# Patient Record
Sex: Male | Born: 1950 | Race: Black or African American | Hispanic: No | Smoking: Former smoker
Health system: Southern US, Community
[De-identification: ages and names within clinical notes are randomized; demographics above are authoritative.]

## PROBLEM LIST (undated history)

## (undated) DIAGNOSIS — Z5189 Encounter for other specified aftercare: Secondary | ICD-10-CM

## (undated) DIAGNOSIS — E119 Type 2 diabetes mellitus without complications: Secondary | ICD-10-CM

## (undated) DIAGNOSIS — Z9114 Patient's other noncompliance with medication regimen: Secondary | ICD-10-CM

## (undated) DIAGNOSIS — J302 Other seasonal allergic rhinitis: Secondary | ICD-10-CM

## (undated) DIAGNOSIS — N183 Chronic kidney disease, stage 3 unspecified: Secondary | ICD-10-CM

## (undated) DIAGNOSIS — I219 Acute myocardial infarction, unspecified: Secondary | ICD-10-CM

## (undated) DIAGNOSIS — M1990 Unspecified osteoarthritis, unspecified site: Secondary | ICD-10-CM

## (undated) DIAGNOSIS — Z992 Dependence on renal dialysis: Secondary | ICD-10-CM

## (undated) DIAGNOSIS — E785 Hyperlipidemia, unspecified: Secondary | ICD-10-CM

## (undated) DIAGNOSIS — H269 Unspecified cataract: Secondary | ICD-10-CM

## (undated) DIAGNOSIS — J189 Pneumonia, unspecified organism: Secondary | ICD-10-CM

## (undated) DIAGNOSIS — I2510 Atherosclerotic heart disease of native coronary artery without angina pectoris: Secondary | ICD-10-CM

## (undated) DIAGNOSIS — T7840XA Allergy, unspecified, initial encounter: Secondary | ICD-10-CM

## (undated) DIAGNOSIS — Z91148 Patient's other noncompliance with medication regimen for other reason: Secondary | ICD-10-CM

## (undated) DIAGNOSIS — I739 Peripheral vascular disease, unspecified: Secondary | ICD-10-CM

## (undated) DIAGNOSIS — M810 Age-related osteoporosis without current pathological fracture: Secondary | ICD-10-CM

## (undated) DIAGNOSIS — I169 Hypertensive crisis, unspecified: Secondary | ICD-10-CM

## (undated) DIAGNOSIS — R0600 Dyspnea, unspecified: Secondary | ICD-10-CM

## (undated) DIAGNOSIS — N186 End stage renal disease: Secondary | ICD-10-CM

## (undated) DIAGNOSIS — H35039 Hypertensive retinopathy, unspecified eye: Secondary | ICD-10-CM

## (undated) DIAGNOSIS — I10 Essential (primary) hypertension: Secondary | ICD-10-CM

## (undated) DIAGNOSIS — E1142 Type 2 diabetes mellitus with diabetic polyneuropathy: Secondary | ICD-10-CM

## (undated) DIAGNOSIS — K219 Gastro-esophageal reflux disease without esophagitis: Secondary | ICD-10-CM

## (undated) DIAGNOSIS — E11319 Type 2 diabetes mellitus with unspecified diabetic retinopathy without macular edema: Secondary | ICD-10-CM

## (undated) DIAGNOSIS — M797 Fibromyalgia: Secondary | ICD-10-CM

## (undated) DIAGNOSIS — I509 Heart failure, unspecified: Secondary | ICD-10-CM

## (undated) DIAGNOSIS — W3400XA Accidental discharge from unspecified firearms or gun, initial encounter: Secondary | ICD-10-CM

## (undated) DIAGNOSIS — H409 Unspecified glaucoma: Secondary | ICD-10-CM

## (undated) DIAGNOSIS — Y249XXA Unspecified firearm discharge, undetermined intent, initial encounter: Secondary | ICD-10-CM

## (undated) DIAGNOSIS — D649 Anemia, unspecified: Secondary | ICD-10-CM

## (undated) HISTORY — PX: JOINT REPLACEMENT: SHX530

## (undated) HISTORY — PX: TOTAL KNEE ARTHROPLASTY: SHX125

## (undated) HISTORY — DX: Allergy, unspecified, initial encounter: T78.40XA

## (undated) HISTORY — DX: Unspecified cataract: H26.9

## (undated) HISTORY — DX: Heart failure, unspecified: I50.9

## (undated) HISTORY — DX: Unspecified glaucoma: H40.9

## (undated) HISTORY — DX: Age-related osteoporosis without current pathological fracture: M81.0

## (undated) HISTORY — DX: Essential (primary) hypertension: I10

## (undated) HISTORY — DX: Acute myocardial infarction, unspecified: I21.9

## (undated) HISTORY — DX: Peripheral vascular disease, unspecified: I73.9

## (undated) HISTORY — PX: EYE SURGERY: SHX253

## (undated) HISTORY — PX: OTHER SURGICAL HISTORY: SHX169

## (undated) HISTORY — DX: Type 2 diabetes mellitus with unspecified diabetic retinopathy without macular edema: E11.319

## (undated) HISTORY — PX: COLONOSCOPY: SHX174

## (undated) HISTORY — DX: Encounter for other specified aftercare: Z51.89

## (undated) HISTORY — DX: Hypertensive retinopathy, unspecified eye: H35.039

## (undated) HISTORY — DX: Hyperlipidemia, unspecified: E78.5

---

## 2015-10-30 DIAGNOSIS — J189 Pneumonia, unspecified organism: Secondary | ICD-10-CM

## 2015-10-30 HISTORY — DX: Pneumonia, unspecified organism: J18.9

## 2017-01-22 ENCOUNTER — Encounter: Payer: Self-pay | Admitting: Family Medicine

## 2017-11-19 ENCOUNTER — Encounter (HOSPITAL_COMMUNITY): Payer: Self-pay | Admitting: Emergency Medicine

## 2017-11-19 ENCOUNTER — Other Ambulatory Visit: Payer: Self-pay

## 2017-11-19 ENCOUNTER — Observation Stay (HOSPITAL_COMMUNITY)
Admission: EM | Admit: 2017-11-19 | Discharge: 2017-11-20 | Disposition: A | Discharge status: Home / Self Care | Payer: BLUE CROSS/BLUE SHIELD | Attending: Internal Medicine | Admitting: Internal Medicine

## 2017-11-19 ENCOUNTER — Emergency Department (HOSPITAL_COMMUNITY): Payer: BLUE CROSS/BLUE SHIELD

## 2017-11-19 DIAGNOSIS — S0101XA Laceration without foreign body of scalp, initial encounter: Secondary | ICD-10-CM | POA: Insufficient documentation

## 2017-11-19 DIAGNOSIS — E162 Hypoglycemia, unspecified: Secondary | ICD-10-CM | POA: Diagnosis present

## 2017-11-19 DIAGNOSIS — W1830XA Fall on same level, unspecified, initial encounter: Secondary | ICD-10-CM | POA: Diagnosis not present

## 2017-11-19 DIAGNOSIS — W19XXXA Unspecified fall, initial encounter: Secondary | ICD-10-CM

## 2017-11-19 DIAGNOSIS — Z7982 Long term (current) use of aspirin: Secondary | ICD-10-CM | POA: Insufficient documentation

## 2017-11-19 DIAGNOSIS — Z794 Long term (current) use of insulin: Secondary | ICD-10-CM | POA: Diagnosis not present

## 2017-11-19 DIAGNOSIS — I10 Essential (primary) hypertension: Secondary | ICD-10-CM | POA: Diagnosis not present

## 2017-11-19 DIAGNOSIS — Z79899 Other long term (current) drug therapy: Secondary | ICD-10-CM | POA: Diagnosis not present

## 2017-11-19 DIAGNOSIS — N179 Acute kidney failure, unspecified: Secondary | ICD-10-CM

## 2017-11-19 DIAGNOSIS — D649 Anemia, unspecified: Secondary | ICD-10-CM

## 2017-11-19 DIAGNOSIS — E11649 Type 2 diabetes mellitus with hypoglycemia without coma: Secondary | ICD-10-CM | POA: Diagnosis not present

## 2017-11-19 HISTORY — DX: Unspecified osteoarthritis, unspecified site: M19.90

## 2017-11-19 LAB — PROTIME-INR
INR: 1.07
Prothrombin Time: 13.8 seconds (ref 11.4–15.2)

## 2017-11-19 LAB — I-STAT CHEM 8, ED
BUN: 20 mg/dL (ref 6–20)
Calcium, Ion: 1.14 mmol/L — ABNORMAL LOW (ref 1.15–1.40)
Chloride: 105 mmol/L (ref 101–111)
Creatinine, Ser: 1.8 mg/dL — ABNORMAL HIGH (ref 0.61–1.24)
Glucose, Bld: 64 mg/dL — ABNORMAL LOW (ref 65–99)
HCT: 36 % — ABNORMAL LOW (ref 39.0–52.0)
Hemoglobin: 12.2 g/dL — ABNORMAL LOW (ref 13.0–17.0)
Potassium: 4.4 mmol/L (ref 3.5–5.1)
Sodium: 142 mmol/L (ref 135–145)
TCO2: 28 mmol/L (ref 22–32)

## 2017-11-19 LAB — GLUCOSE, CAPILLARY: Glucose-Capillary: 187 mg/dL — ABNORMAL HIGH (ref 65–99)

## 2017-11-19 LAB — CBG MONITORING, ED
Glucose-Capillary: 27 mg/dL — CL (ref 65–99)
Glucose-Capillary: 77 mg/dL (ref 65–99)
Glucose-Capillary: 64 mg/dL — ABNORMAL LOW (ref 65–99)
Glucose-Capillary: 173 mg/dL — ABNORMAL HIGH (ref 65–99)
Glucose-Capillary: 184 mg/dL — ABNORMAL HIGH (ref 65–99)

## 2017-11-19 MED ORDER — GABAPENTIN 400 MG PO CAPS
800.0000 mg | ORAL_CAPSULE | Freq: Three times a day (TID) | ORAL | Status: DC
  Administered 2017-11-19 – 2017-11-20 (×2): 800 mg via ORAL
  Filled 2017-11-19 (×2): qty 2

## 2017-11-19 MED ORDER — HYDRALAZINE HCL 20 MG/ML IJ SOLN
10.0000 mg | Freq: Once | INTRAMUSCULAR | Status: AC
  Administered 2017-11-19: 10 mg via INTRAVENOUS
  Filled 2017-11-19: qty 1

## 2017-11-19 MED ORDER — HYDRALAZINE HCL 20 MG/ML IJ SOLN
10.0000 mg | INTRAMUSCULAR | Status: DC | PRN
  Administered 2017-11-20: 10 mg via INTRAVENOUS
  Filled 2017-11-19: qty 1

## 2017-11-19 MED ORDER — BACITRACIN ZINC 500 UNIT/GM EX OINT
TOPICAL_OINTMENT | Freq: Once | CUTANEOUS | Status: AC
  Administered 2017-11-19: 1 via TOPICAL
  Filled 2017-11-19: qty 0.9

## 2017-11-19 MED ORDER — METOPROLOL TARTRATE 50 MG PO TABS
100.0000 mg | ORAL_TABLET | Freq: Two times a day (BID) | ORAL | Status: DC
  Administered 2017-11-19 – 2017-11-20 (×2): 100 mg via ORAL
  Filled 2017-11-19 (×2): qty 2

## 2017-11-19 MED ORDER — MONTELUKAST SODIUM 10 MG PO TABS: 10.0000 mg | ORAL_TABLET | Freq: Every day | ORAL | Status: DC

## 2017-11-19 MED ORDER — ASPIRIN EC 81 MG PO TBEC
81.0000 mg | DELAYED_RELEASE_TABLET | Freq: Every day | ORAL | Status: DC
  Administered 2017-11-20: 81 mg via ORAL
  Filled 2017-11-19: qty 1

## 2017-11-19 MED ORDER — ACETAMINOPHEN 650 MG RE SUPP: 650.0000 mg | Freq: Four times a day (QID) | RECTAL | Status: DC | PRN

## 2017-11-19 MED ORDER — ACETAMINOPHEN 325 MG PO TABS: 650.0000 mg | ORAL_TABLET | Freq: Four times a day (QID) | ORAL | Status: DC | PRN

## 2017-11-19 MED ORDER — HYDRALAZINE HCL 20 MG/ML IJ SOLN: 20.0000 mg | INTRAMUSCULAR | Status: DC

## 2017-11-19 MED ORDER — SODIUM CHLORIDE 0.9% FLUSH: 3.0000 mL | INTRAVENOUS | Status: DC | PRN

## 2017-11-19 MED ORDER — HYDRALAZINE HCL 20 MG/ML IJ SOLN
10.0000 mg | INTRAMUSCULAR | Status: AC
  Administered 2017-11-19: 10 mg via INTRAVENOUS
  Filled 2017-11-19: qty 1

## 2017-11-19 MED ORDER — ONDANSETRON HCL 4 MG/2ML IJ SOLN
4.0000 mg | Freq: Four times a day (QID) | INTRAMUSCULAR | Status: DC | PRN
  Administered 2017-11-20: 4 mg via INTRAVENOUS
  Filled 2017-11-19: qty 2

## 2017-11-19 MED ORDER — SENNOSIDES-DOCUSATE SODIUM 8.6-50 MG PO TABS: 1.0000 | ORAL_TABLET | Freq: Every evening | ORAL | Status: DC | PRN

## 2017-11-19 MED ORDER — HYDROCODONE-ACETAMINOPHEN 10-325 MG PO TABS
0.5000 | ORAL_TABLET | Freq: Three times a day (TID) | ORAL | Status: DC | PRN
  Administered 2017-11-19: 1 via ORAL
  Administered 2017-11-20: 0.5 via ORAL
  Filled 2017-11-19 (×2): qty 1

## 2017-11-19 MED ORDER — DEXTROSE 50 % IV SOLN
INTRAVENOUS | Status: AC
  Administered 2017-11-19: 50 mL
  Filled 2017-11-19: qty 50

## 2017-11-19 MED ORDER — SODIUM CHLORIDE 0.9 % IV SOLN
250.0000 mL | INTRAVENOUS | Status: DC | PRN
  Administered 2017-11-19: 250 mL via INTRAVENOUS

## 2017-11-19 MED ORDER — SODIUM CHLORIDE 0.9% FLUSH
3.0000 mL | Freq: Two times a day (BID) | INTRAVENOUS | Status: DC
  Administered 2017-11-19 – 2017-11-20 (×2): 3 mL via INTRAVENOUS

## 2017-11-19 MED ORDER — CLOPIDOGREL BISULFATE 75 MG PO TABS
75.0000 mg | ORAL_TABLET | Freq: Every day | ORAL | Status: DC
  Administered 2017-11-20: 75 mg via ORAL
  Filled 2017-11-19: qty 1

## 2017-11-19 MED ORDER — DEXTROSE 10 % IV SOLN
100.0000 mL | Freq: Once | INTRAVENOUS | Status: AC
Start: 2017-11-19 — End: 2017-11-19
  Administered 2017-11-19: 100 mL via INTRAVENOUS
  Filled 2017-11-19: qty 500

## 2017-11-19 MED ORDER — VITAMIN E 45 MG (100 UNIT) PO CAPS
1000.0000 [IU] | ORAL_CAPSULE | Freq: Every day | ORAL | Status: DC
  Administered 2017-11-20: 1000 [IU] via ORAL
  Filled 2017-11-19: qty 2

## 2017-11-19 MED ORDER — ONDANSETRON HCL 4 MG PO TABS: 4.0000 mg | ORAL_TABLET | Freq: Four times a day (QID) | ORAL | Status: DC | PRN

## 2017-11-19 MED ORDER — AMLODIPINE BESYLATE 5 MG PO TABS
5.0000 mg | ORAL_TABLET | Freq: Every day | ORAL | Status: DC
  Administered 2017-11-20: 5 mg via ORAL
  Filled 2017-11-19: qty 1

## 2017-11-19 MED ORDER — ROSUVASTATIN CALCIUM 10 MG PO TABS: 10.0000 mg | ORAL_TABLET | Freq: Every day | ORAL | Status: DC

## 2017-11-19 MED ORDER — VITAMIN D3 25 MCG (1000 UNIT) PO TABS
1000.0000 [IU] | ORAL_TABLET | Freq: Every day | ORAL | Status: DC
  Administered 2017-11-20: 1000 [IU] via ORAL
  Filled 2017-11-19: qty 1

## 2017-11-19 NOTE — ED Notes (Signed)
Pt's CBG=77

## 2017-11-19 NOTE — ED Notes (Signed)
Pt had drawn for labs:  [place] Lav[place]  lt green Dark green x2

## 2017-11-19 NOTE — ED Notes (Signed)
ED TO INPATIENT HANDOFF REPORT  Name/Age/Gender William Webb 67 y.o. male  Code Status Code Status History    This patient does not have a recorded code status. Please follow your organizational policy for patients in this situation.      Home/SNF/Other Home  Chief Complaint Hypoglycemia  Level of Care/Admitting Diagnosis ED Disposition    ED Disposition Condition Comment   Admit  Hospital Area: [facility] [[id number]]  Level of Care: Telemetry [5]  Admit to tele based on following criteria: Other see comments  Comments: hypoglycemia  Diagnosis: Hypoglycemia [038882]  Admitting Physician: Aline August [[id number]]  Attending Physician: Aline August [[id number]]  PT Class (Do Not Modify): Observation [104]  PT Acc Code (Do Not Modify): Observation [10022]       Medical History Past Medical History:  Diagnosis Date  . Diabetes mellitus without complication ([place])   . Neuropathy   . Osteoarthritis     Allergies No Known Allergies  IV Location/Drains/Wounds Patient Lines/Drains/Airways Status   Active Line/Drains/Airways    Name:   Placement date:   Placement time:   Site:   Days:   Peripheral IV 11/19/17 Right Hand   11/19/17    1341    Hand   less than 1          Labs/Imaging Results for orders placed or performed during the hospital encounter of 11/19/17 (from the past 48 hour(s))  CBG monitoring, ED     Status: Abnormal   Collection Time: 11/19/17  1:51 PM  Result Value Ref Range   Glucose-Capillary 27 (LL) 65 - 99 mg/dL  CBG monitoring, ED (now and then every hour for 3 hours)     Status: None   Collection Time: 11/19/17  2:29 PM  Result Value Ref Range   Glucose-Capillary 77 65 - 99 mg/dL  Protime-INR     Status: None   Collection Time: 11/19/17  2:35 PM  Result Value Ref Range   Prothrombin Time 13.8 11.4 - 15.2 seconds   INR 1.07   I-Stat Chem 8, ED     Status: Abnormal   Collection Time: 11/19/17  2:42 PM  Result Value Ref  Range   Sodium 142 135 - 145 mmol/L   Potassium 4.4 3.5 - 5.1 mmol/L   Chloride 105 101 - 111 mmol/L   BUN 20 6 - 20 mg/dL   Creatinine, Ser 1.80 (H) 0.61 - 1.24 mg/dL   Glucose, Bld 64 (L) 65 - 99 mg/dL   Calcium, Ion 1.14 (L) 1.15 - 1.40 mmol/L   TCO2 28 22 - 32 mmol/L   Hemoglobin 12.2 (L) 13.0 - 17.0 g/dL   HCT 36.0 (L) 39.0 - 52.0 %  CBG monitoring, ED (now and then every hour for 3 hours)     Status: Abnormal   Collection Time: 11/19/17  3:37 PM  Result Value Ref Range   Glucose-Capillary 64 (L) 65 - 99 mg/dL  POC CBG, ED     Status: Abnormal   Collection Time: 11/19/17  6:06 PM  Result Value Ref Range   Glucose-Capillary 173 (H) 65 - 99 mg/dL   Ct Head Wo Contrast  Result Date: 11/19/2017 CLINICAL DATA:  [place] today hitting head, small laceration to the left posterior scalp EXAM: CT HEAD WITHOUT CONTRAST TECHNIQUE: Contiguous axial images were obtained from the base of the skull through the vertex without intravenous contrast. COMPARISON:  None. FINDINGS: Brain: The ventricular system is minimally prominent as are the cortical sulci indicating very  mild atrophy. The septum is midline in position. Fourth ventricle and basilar cisterns are unremarkable. No hemorrhage, mass lesion, or acute infarction is seen. Vascular: Some calcification of the vertebral arteries is noted. Skull: On bone window images, no calvarial abnormality is seen. Sinuses/Orbits: There is a small amount of fluid layering within the right partition of the sphenoid sinus. No definite basilar skull fracture is evident. Other: None. IMPRESSION: 1. No acute intracranial abnormality.  Mild atrophy. 2. Small amount of fluid layers in the right partition of the sphenoid sinus. No definite basilar skull fracture is seen. Electronically Signed   By: Ivar Drape M.D.   On: 11/19/2017 14:48    Pending Labs [employer] (From admission, onward)   Start     Ordered   Signed and Held  Hemoglobin A1c  Tomorrow morning,   R      Signed and Held   Signed and Held  Comprehensive metabolic panel  Tomorrow morning,   R     Signed and Held   Signed and Held  CBC  Tomorrow morning,   R     Signed and Held      Vitals/Pain Today's Vitals   11/19/17 1650 11/19/17 1700 11/19/17 1715 11/19/17 1730  BP: (!) 235/110 (!) 219/115 (!) 217/108 (!) 187/91  Pulse: 69 71 73 72  Resp: 20     SpO2: 100% 100% 100% 100%  Weight:      Height:        Isolation Precautions No active isolations  Medications Medications  sodium chloride flush (NS) 0.9 % injection 3 mL (3 mLs Intravenous Given 11/19/17 1454)  sodium chloride flush (NS) 0.9 % injection 3 mL (not administered)  0.9 %  sodium chloride infusion (250 mLs Intravenous New Bag/Given 11/19/17 1422)  dextrose 50 % solution (50 mLs  Given 11/19/17 1359)  dextrose 10 % infusion (0 mLs Intravenous Stopped 11/19/17 1819)  hydrALAZINE (APRESOLINE) injection 10 mg (10 mg Intravenous Given 11/19/17 1700)  bacitracin ointment (1 application Topical Given 11/19/17 1736)    Mobility walks

## 2017-11-19 NOTE — ED Provider Notes (Signed)
[facility] DEPT Provider Note   CSN: [id number] Arrival date & time: 11/19/17  1317     History   Chief Complaint Chief Complaint  Patient presents with  . Hypoglycemia    HPI William Webb is a 67 y.o. male with PMH/o HTN resents for evaluation of hypoglycemia.  Per patient, he did not have anything to eat today but still took 50 units of his insulin today.  Patient states that he was planning to go eat lunch right after he took his insulin but got delayed because of a phone call.  Patient states that he felt lightheaded on the phone and fell backwards and hit his head.  She states he is on blood thinners.  Patient takes 10 units of NovoLog 2-3 times a day +50 units of the [place].  He has not changed any of his medications.  He denies any preceding dizziness, chest pain.  Patient denies any recent sicknesses, fevers, abdominal pain, nausea/vomiting, dysuria, hematuria.  The history is provided by the patient.    Past Medical History:  Diagnosis Date  . Diabetes mellitus without complication ([place])     There are no active problems to display for this patient.    The histories are not reviewed yet. Please review them in the "History" navigator section and refresh this [place].     Home Medications    Prior to Admission medications   Medication Sig Start Date End Date Taking? Authorizing Provider  gabapentin (NEURONTIN) 800 MG tablet Take 800 mg by mouth 3 (three) times daily.   Yes [provider]  insulin aspart (NOVOLOG) 100 UNIT/ML injection Inject 4 Units into the skin 3 (three) times daily as needed for high blood sugar.   Yes [provider]  insulin degludec (TRESIBA FLEXTOUCH) 100 UNIT/ML SOPN FlexTouch Pen Inject 4 Units into the skin at bedtime.   Yes [provider]  rosuvastatin (CRESTOR) 10 MG tablet Take 10 mg by mouth daily.   Yes [provider]    Family History History reviewed. No  pertinent family history.  Social History Social History   Tobacco Use  . Smoking status: Former Research scientist (life sciences)  . Smokeless tobacco: Never Used  Substance Use Topics  . Alcohol use: Yes  . Drug use: No     Allergies   Patient has no known allergies.   Review of Systems Review of Systems  Constitutional: Negative for fever.  Respiratory: Negative for cough and shortness of breath.   Cardiovascular: Negative for chest pain.  Gastrointestinal: Negative for abdominal pain, nausea and vomiting.  Genitourinary: Negative for dysuria and hematuria.  Neurological: Positive for light-headedness. Negative for dizziness and headaches.     Physical Exam Updated Vital Signs BP (!) 232/112 (BP Location: Left Arm)   Pulse 62   Resp 12   Ht 6\' 3"  (1.905 m)   Wt 98 kg (216 lb)   SpO2 98%   BMI 27.00 kg/m   Physical Exam  Constitutional: He is oriented to person, place, and time. He appears well-developed and well-nourished.  HENT:  Head: Normocephalic and atraumatic.    Mouth/Throat: Oropharynx is clear and moist and mucous membranes are normal.  No skull deformity or crepitus.   Eyes: Conjunctivae, EOM and lids are normal. Pupils are equal, round, and reactive to light.  Neck: Full passive range of motion without pain.  Cardiovascular: Normal rate, regular rhythm, normal heart sounds and normal pulses. Exam reveals no gallop and no friction rub.  No  murmur heard. Pulmonary/Chest: Effort normal and breath sounds normal.  Abdominal: Soft. Normal appearance. There is no tenderness. There is no rigidity and no guarding.  Musculoskeletal: Normal range of motion.  Neurological: He is alert and oriented to person, place, and time.  Follows commands, Moves all extremities  5/5 strength to BUE and BLE  Sensation intact throughout all major nerve distributions A&Ox4  Skin: Skin is warm and dry. Capillary refill takes less than 2 seconds.  Psychiatric: He has a normal mood and affect. His  speech is normal.  Nursing note and vitals reviewed.    ED Treatments / Results  Labs (all labs ordered are listed, but only abnormal results are displayed) Labs Reviewed  CBG MONITORING, ED - Abnormal; Notable for the following components:      Result Value   Glucose-Capillary 27 (*)    All other components within normal limits  I-STAT CHEM 8, ED - Abnormal; Notable for the following components:   Creatinine, Ser 1.80 (*)    Glucose, Bld 64 (*)    Calcium, Ion 1.14 (*)    Hemoglobin 12.2 (*)    HCT 36.0 (*)    All other components within normal limits  CBG MONITORING, ED - Abnormal; Notable for the following components:   Glucose-Capillary 64 (*)    All other components within normal limits  PROTIME-INR  CBG MONITORING, ED  CBG MONITORING, ED  CBG MONITORING, ED    EKG  EKG Interpretation None       Radiology Ct Head Wo Contrast  Result Date: 11/19/2017 CLINICAL DATA:  [place] today hitting head, small laceration to the left posterior scalp EXAM: CT HEAD WITHOUT CONTRAST TECHNIQUE: Contiguous axial images were obtained from the base of the skull through the vertex without intravenous contrast. COMPARISON:  None. FINDINGS: Brain: The ventricular system is minimally prominent as are the cortical sulci indicating very mild atrophy. The septum is midline in position. Fourth ventricle and basilar cisterns are unremarkable. No hemorrhage, mass lesion, or acute infarction is seen. Vascular: Some calcification of the vertebral arteries is noted. Skull: On bone window images, no calvarial abnormality is seen. Sinuses/Orbits: There is a small amount of fluid layering within the right partition of the sphenoid sinus. No definite basilar skull fracture is evident. Other: None. IMPRESSION: 1. No acute intracranial abnormality.  Mild atrophy. 2. Small amount of fluid layers in the right partition of the sphenoid sinus. No definite basilar skull fracture is seen. Electronically Signed   By:  Ivar Drape M.D.   On: 11/19/2017 14:48    Procedures Procedures (including critical care time)  Medications Ordered in ED Medications  sodium chloride flush (NS) 0.9 % injection 3 mL (3 mLs Intravenous Given 11/19/17 1454)  sodium chloride flush (NS) 0.9 % injection 3 mL (not administered)  0.9 %  sodium chloride infusion (250 mLs Intravenous New Bag/Given 11/19/17 1422)  dextrose 10 % infusion (not administered)  hydrALAZINE (APRESOLINE) injection 10 mg (not administered)  bacitracin ointment (not administered)  dextrose 50 % solution (50 mLs  Given 11/19/17 1359)     Initial Impression / Assessment and Plan / ED Course  I have reviewed the triage vital signs and the nursing notes.  Pertinent labs & imaging results that were available during my care of the patient were reviewed by me and considered in my medical decision making (see chart for details).      67 y.o. male with past medical history of diabetes who presents for evaluation of hypoglycemia.  Patient states that he had did not have anything to eat all day and took 50 units of his insulin.  Patient reports that he started feeling lightheaded causing him to pass out and hit the back posterior aspect of his head.  On EMS arrival, blood glucose was 35.  Improved to 89 after D 25 IV.  On ED arrival, patient is alert and oriented x4.  Initial blood glucose reading is 27.  Patient given D50.  Will also have patient eat.  Patient does have a small superficial abrasion noted to the left posterior aspect of his head.  He is on blood thinners.  We will plan to get a CT head for further evaluation.  Repeat CBG is 77.  He is still continuing to eat.  We will plan to repeat CBG and monitor after labs.  I-STAT Chem-8 reviewed.  Creatinine is 1.80.  Repeat CBG/64.  He has not improved after 2 IV doses of the 25 D50 and continuously eating here in the emergency department.  We will plan to start him on a D10 drip.  We will plan to consult  hospitalist for admission.  Patient does have elevated blood pressure here in the department.  Denies any chest pain, difficulty breathing, numbness/weakness of his arms or legs.  He is on blood pressure medication.  Does not recall what medication he is on.  Will plan to give a small dose of antihypertensives here in the department.  Discussed patient with Dr. Bonney Roussel (hospitalist). Will admit.   Final Clinical Impressions(s) / ED Diagnoses   Final diagnoses:  Hypoglycemia    ED Discharge Orders    None       Desma Mcgregor 11/19/17 2312    Sherwood Gambler, MD 11/20/17 1020

## 2017-11-19 NOTE — ED Notes (Signed)
Called pharmacy, waiting for

## 2017-11-19 NOTE — ED Notes (Signed)
Bed: [facility] Expected date:  Expected time:  Means of arrival:  Comments: 

## 2017-11-19 NOTE — ED Notes (Signed)
Pt given coca cola, [place] sandwhich and chicken noodle soup.

## 2017-11-19 NOTE — Progress Notes (Signed)
Patient approved of brother and sister to be in room during admission process questioning

## 2017-11-19 NOTE — ED Triage Notes (Signed)
Per ems pt from house, hypoglycemia. EMS reports ran this call twice  today, refused transport the first time. Per EMS per  Pt took 50 units insulin after the first episode. second episode CBG reading 35, corrected to 89 after give D 25 IV. Alert and oriented x 4.

## 2017-11-19 NOTE — H&P (Signed)
History and Physical    William Webb ZOX:[id number] DOB: [birth year] DOA: 11/19/2017  PCP: No primary care provider on file.   Patient coming from: Home  I have personally briefly reviewed patient's old medical records in [place]  Chief Complaint: Low blood sugar  HPI: William Webb is a 67 y.o. male with medical history significant of diabetes mellitus type 2 on insulin, hypertension presented with hypoglycemia.  Patient stated that he did not have anything to eat today but he still took his 50 units of [place].  He was planning to eat lunch right after his insulin but got delayed because of phone call.  He felt lightheaded and fell backwards and hit his head.  Patient denies any seizure activities, recent fever, nausea, vomiting, bowel or bladder incontinence.  He states that his dose of [place] has not been changed recently although his dose of NovoLog has been changed.  ED Course: On EMS arrival, blood glucose was 35 which improved to 89 after IV dextrose.  In the ED patient's blood glucose was 27 for which she was given D50.  He was given food to eat.  CT head was negative for intracranial bleed.  Repeat blood sugar was still is 64.  The patient was started on D10 drip.  Hospitalist service was called to evaluate the patient.  Review of Systems: As per HPI otherwise 10 point review of systems negative.    Past Medical History:  Diagnosis Date  . Diabetes mellitus without complication ([place])     History reviewed. No pertinent surgical history.  Social history  reports that he has quit smoking. he has never used smokeless tobacco. He reports that he drinks alcohol. He reports that he does not use drugs.  -Recently moved from [facility].  No Known Allergies  Family history History reviewed. No pertinent family history.  But no history of cancer or TB  Prior to Admission medications   Medication Sig Start Date End Date Taking? Authorizing Provider  gabapentin  (NEURONTIN) 800 MG tablet Take 800 mg by mouth 3 (three) times daily.   Yes [provider]  insulin aspart (NOVOLOG) 100 UNIT/ML injection Inject 4 Units into the skin 3 (three) times daily as needed for high blood sugar.   Yes [provider]  insulin degludec (TRESIBA FLEXTOUCH) 100 UNIT/ML SOPN FlexTouch Pen Inject 4 Units into the skin at bedtime.   Yes [provider]  rosuvastatin (CRESTOR) 10 MG tablet Take 10 mg by mouth daily.   Yes [provider]  amLODipine (NORVASC) 5 MG tablet Take 5 mg by mouth daily.    [provider]  aspirin EC 81 MG tablet Take 81 mg by mouth daily.    [provider]  clopidogrel (PLAVIX) 75 MG tablet Take 75 mg by mouth daily.    [provider]  HYDROcodone-acetaminophen (NORCO) 10-325 MG tablet Take 0.5-1 tablets by mouth every 8 (eight) hours as needed for moderate pain.    [provider]  metoprolol tartrate (LOPRESSOR) 100 MG tablet Take 100 mg by mouth 2 (two) times daily.    [provider]  montelukast (SINGULAIR) 10 MG tablet Take 10 mg by mouth at bedtime.    [provider]    Physical Exam: Vitals:   11/19/17 1409 11/19/17 1505  BP: (!) 197/92 (!) 232/112  Pulse: (!) 57 62  Resp: 18 12  SpO2: 99% 98%  Weight: 98 kg (216 lb)   Height: 6\' 3"  (1.905 m)  Constitutional: NAD, calm, comfortable Vitals:   11/19/17 1409 11/19/17 1505  BP: (!) 197/92 (!) 232/112  Pulse: (!) 57 62  Resp: 18 12  SpO2: 99% 98%  Weight: 98 kg (216 lb)   Height: 6\' 3"  (1.905 m)    Eyes: PERRL, lids and conjunctivae normal ENMT: Mucous membranes are moist. Posterior pharynx clear of any exudate or lesions.Neck: normal, supple, no masses, no thyromegaly Respiratory: Bilateral decreased breath sounds at bases, no wheezing.  No accessory muscle use.   Cardiovascular: S1-S2 positive, rate controlled.  No murmurs.  1-2+ pitting edema  abdomen: Soft, mildly distended, no  tenderness, no masses palpated. No hepatosplenomegaly. Bowel sounds positive.  Musculoskeletal: no clubbing / cyanosis. No joint deformity upper and lower extremities. Skin: Small abrasion on the left parietal region.  No other rashes, lesions, ulcers.  Neurologic: CN 2-12 grossly intact.  Moving extremities.  Alert awake oriented. Psychiatric: Normal judgment and insight. Alert and oriented x 3. Normal mood.   Labs on Admission: I have personally reviewed following labs and imaging studies  CBC: Recent Labs  Lab 11/19/17 1442  HGB 12.2*  HCT 43.3*   Basic Metabolic Panel: Recent Labs  Lab 11/19/17 1442  NA 142  K 4.4  CL 105  GLUCOSE 64*  BUN 20  CREATININE 1.80*   GFR: Estimated Creatinine Clearance: 47.6 mL/min (A) (by C-G formula based on SCr of 1.8 mg/dL (H)). Liver Function Tests: No results for input(s): AST, ALT, ALKPHOS, BILITOT, PROT, ALBUMIN in the last 168 hours. No results for input(s): LIPASE, AMYLASE in the last 168 hours. No results for input(s): AMMONIA in the last 168 hours. Coagulation Profile: Recent Labs  Lab 11/19/17 1435  INR 1.07   Cardiac Enzymes: No results for input(s): CKTOTAL, CKMB, CKMBINDEX, TROPONINI in the last 168 hours. BNP (last 3 results) No results for input(s): PROBNP in the last 8760 hours. HbA1C: No results for input(s): HGBA1C in the last 72 hours. CBG: Recent Labs  Lab 11/19/17 1351 11/19/17 1429 11/19/17 1537  GLUCAP 27* 77 64*   Lipid Profile: No results for input(s): CHOL, HDL, LDLCALC, TRIG, CHOLHDL, LDLDIRECT in the last 72 hours. Thyroid Function Tests: No results for input(s): TSH, T4TOTAL, FREET4, T3FREE, THYROIDAB in the last 72 hours. Anemia Panel: No results for input(s): VITAMINB12, FOLATE, FERRITIN, TIBC, IRON, RETICCTPCT in the last 72 hours. Urine analysis: No results found for: COLORURINE, APPEARANCEUR, LABSPEC, [place], GLUCOSEU, [place], BILIRUBINUR, KETONESUR, PROTEINUR, UROBILINOGEN, NITRITE,  LEUKOCYTESUR  Radiological Exams on Admission: Ct Head Wo Contrast  Result Date: 11/19/2017 CLINICAL DATA:  [place] today hitting head, small laceration to the left posterior scalp EXAM: CT HEAD WITHOUT CONTRAST TECHNIQUE: Contiguous axial images were obtained from the base of the skull through the vertex without intravenous contrast. COMPARISON:  None. FINDINGS: Brain: The ventricular system is minimally prominent as are the cortical sulci indicating very mild atrophy. The septum is midline in position. Fourth ventricle and basilar cisterns are unremarkable. No hemorrhage, mass lesion, or acute infarction is seen. Vascular: Some calcification of the vertebral arteries is noted. Skull: On bone window images, no calvarial abnormality is seen. Sinuses/Orbits: There is a small amount of fluid layering within the right partition of the sphenoid sinus. No definite basilar skull fracture is evident. Other: None. IMPRESSION: 1. No acute intracranial abnormality.  Mild atrophy. 2. Small amount of fluid layers in the right partition of the sphenoid sinus. No definite basilar skull fracture is seen. Electronically Signed   By: Windy Canny.D.  On: 11/19/2017 14:48    Assessment/Plan Principal Problem:   Hypoglycemia Active Problems:   Essential hypertension   Symptomatic hypoglycemia -Probably secondary to long-acting insulin/[place]. -We will continue D10 drip.  Continue CBGs every 2 hours. -Hold insulin for now  Fall -Probably second to hypoglycemia.  CT head was negative for intracranial bleed -Doubt that patient had syncopal event.  Will get 2D echo and ultrasound carotids -Monitor on telemetry  Hypertension -Blood pressure is extremely elevated.  Continue monitoring.  Continue home regimen and use IV antihypertensives if needed  Probable chronic kidney disease -Unclear about staging.  No prior labs available.  Will repeat a.m. Creatinine  Dyslipidemia -Continue statin    DVT  prophylaxis: SCDs, early ambulation Code Status: Full Family Communication: Spoke to patient and family members at bedside Disposition Plan: Home probably tomorrow Consults called: None Admission status: Observation in telemetry  Severity of Illness: The appropriate patient status for this patient is OBSERVATION. Observation status is judged to be reasonable and necessary in order to provide the required intensity of service to ensure the patient's safety. The patient's presenting symptoms, physical exam findings, and initial radiographic and laboratory data in the context of their medical condition is felt to place them at decreased risk for further clinical deterioration. Furthermore, it is anticipated that the patient will be medically stable for discharge from the hospital within 2 midnights of admission. The following factors support the patient status of observation.   " The patient's presenting symptoms include dizziness and fall. " The physical exam findings include elevated blood pressure. " The initial radiographic and laboratory data are hypoglycemia.    Aline August MD [facility] Pager [phone]  If 7PM-7AM, please contact night-coverage [email or web address] Password [facility]  11/19/2017, 4:29 PM

## 2017-11-20 ENCOUNTER — Observation Stay (HOSPITAL_BASED_OUTPATIENT_CLINIC_OR_DEPARTMENT_OTHER): Payer: BLUE CROSS/BLUE SHIELD

## 2017-11-20 DIAGNOSIS — I10 Essential (primary) hypertension: Secondary | ICD-10-CM | POA: Diagnosis not present

## 2017-11-20 DIAGNOSIS — D649 Anemia, unspecified: Secondary | ICD-10-CM

## 2017-11-20 DIAGNOSIS — E162 Hypoglycemia, unspecified: Secondary | ICD-10-CM

## 2017-11-20 DIAGNOSIS — W19XXXA Unspecified fall, initial encounter: Secondary | ICD-10-CM

## 2017-11-20 DIAGNOSIS — R55 Syncope and collapse: Secondary | ICD-10-CM

## 2017-11-20 DIAGNOSIS — N179 Acute kidney failure, unspecified: Secondary | ICD-10-CM

## 2017-11-20 LAB — COMPREHENSIVE METABOLIC PANEL
ALT: 15 U/L — ABNORMAL LOW (ref 17–63)
AST: 21 U/L (ref 15–41)
Albumin: 2.4 g/dL — ABNORMAL LOW (ref 3.5–5.0)
Alkaline Phosphatase: 71 U/L (ref 38–126)
Anion gap: 5 (ref 5–15)
BUN: 24 mg/dL — ABNORMAL HIGH (ref 6–20)
CO2: 27 mmol/L (ref 22–32)
Calcium: 8.2 mg/dL — ABNORMAL LOW (ref 8.9–10.3)
Chloride: 105 mmol/L (ref 101–111)
Creatinine, Ser: 1.8 mg/dL — ABNORMAL HIGH (ref 0.61–1.24)
GFR calc Af Amer: 43 mL/min — ABNORMAL LOW (ref >60)
GFR calc non Af Amer: 37 mL/min — ABNORMAL LOW (ref >60)
Glucose, Bld: 186 mg/dL — ABNORMAL HIGH (ref 65–99)
Potassium: 4.8 mmol/L (ref 3.5–5.1)
Sodium: 137 mmol/L (ref 135–145)
Total Bilirubin: 0.4 mg/dL (ref 0.3–1.2)
Total Protein: 5.2 g/dL — ABNORMAL LOW (ref 6.5–8.1)

## 2017-11-20 LAB — GLUCOSE, CAPILLARY
Glucose-Capillary: 209 mg/dL — ABNORMAL HIGH (ref 65–99)
Glucose-Capillary: 193 mg/dL — ABNORMAL HIGH (ref 65–99)
Glucose-Capillary: 139 mg/dL — ABNORMAL HIGH (ref 65–99)
Glucose-Capillary: 135 mg/dL — ABNORMAL HIGH (ref 65–99)
Glucose-Capillary: 159 mg/dL — ABNORMAL HIGH (ref 65–99)
Glucose-Capillary: 146 mg/dL — ABNORMAL HIGH (ref 65–99)
Glucose-Capillary: 135 mg/dL — ABNORMAL HIGH (ref 65–99)

## 2017-11-20 LAB — HEMOGLOBIN A1C
Hgb A1c MFr Bld: 6.4 % — ABNORMAL HIGH (ref 4.8–5.6)
Mean Plasma Glucose: 136.98 mg/dL

## 2017-11-20 LAB — CBC
HCT: 30.6 % — ABNORMAL LOW (ref 39.0–52.0)
Hemoglobin: 10 g/dL — ABNORMAL LOW (ref 13.0–17.0)
MCH: 27.9 pg (ref 26.0–34.0)
MCHC: 32.7 g/dL (ref 30.0–36.0)
MCV: 85.5 fL (ref 78.0–100.0)
Platelets: 189 10*3/uL (ref 150–400)
RBC: 3.58 MIL/uL — ABNORMAL LOW (ref 4.22–5.81)
RDW: 13.7 % (ref 11.5–15.5)
WBC: 4.6 10*3/uL (ref 4.0–10.5)

## 2017-11-20 LAB — ECHOCARDIOGRAM COMPLETE
Height: 75 in
Weight: 3492.09 oz

## 2017-11-20 MED ORDER — INSULIN DEGLUDEC 100 UNIT/ML ~~LOC~~ SOPN: PEN_INJECTOR | SUBCUTANEOUS | Status: DC

## 2017-11-20 MED ORDER — ROSUVASTATIN CALCIUM 10 MG PO TABS: 10.0000 mg | ORAL_TABLET | Freq: Every day | ORAL | Status: DC

## 2017-11-20 MED ORDER — GI COCKTAIL ~~LOC~~
30.0000 mL | Freq: Once | ORAL | Status: AC
  Administered 2017-11-20: 30 mL via ORAL
  Filled 2017-11-20: qty 30

## 2017-11-20 NOTE — Discharge Summary (Signed)
Discharge Summary  William Webb IHK:[id number] DOB: 1951/03/16  PCP: System, Pcp Not In  Admit date: 11/19/2017 Discharge date: 11/20/2017  Time spent: < 25 minutes  Admitted From: Home Disposition:  Home Recommendations for Outpatient Follow-up:  1. Follow up with PCP in 1-2 weeks, Tresiba decreased to 35 U am ( from 50 U) 2. Please obtain BMP in one week for improvement in creatinine and monitor blood glucose on current tresiba dose 3. Please obtain CBC on PCP f/u to check hemoglobin as mentioned below   Home Health:NO Equipment/Devices:None Discharge Diagnoses:  Active Hospital Problems   Diagnosis Date Noted  . Hypoglycemia 11/19/2017  . Accident due to mechanical fall without injury 11/20/2017  . AKI (acute kidney injury) ([place]) 11/20/2017  . Essential hypertension 11/19/2017    Resolved Hospital Problems  No resolved problems to display.    Discharge Condition: Stable CODE STATUS:Full  Diet recommendation: Carb Modified  Vitals:   11/20/17 1012 11/20/17 1122  BP: (!) 142/68   Pulse:  74  Resp:    Temp:    SpO2:      History of present illness:  William Webb is a 67 y.o. year old male with medical history significant for diabetes mellitus type 2 on insulin, hypertension  who presented on 11/19/2017 with hypoglycemia and mechanical ground level fall and was found to have AKI.   Hospital Course:  Principal Problem:   Hypoglycemia Active Problems:   Essential hypertension   Accident due to mechanical fall without injury   AKI (acute kidney injury) ([place])   Hyperglycemia, presumably secondary to insulin regimen Ground level Mechanical Fall Patient states he took his typical dose of 50 units but forgot to eat breakfast for more than an hour.  Feeling somewhat lightheaded he remembers tripping over a rug in front of his shower before falling.  Patient denied any other prodromal symptoms.  Evaluation for syncope observation: CT head with no acute  intracranial abnormalities,TTE unremarkable, carotid ultrasound no significant stenosis.  Orthostatic vitals negative with initial blood pressure quite hypertensive to 230s over 110s.  Patient was on D10 drip for a few hours  then observed overnight discontinuation of the drip and glucose with no further hypoglycemic episodes.  Of note patient states he has been on a consistent dose of his long-acting insulin: However, noted some recent changes to his short acting (patient previously seen by PCP [facility]).  It would be important for patient follow-up with CPS he is transitioning perspective [employer].  On discharge decrease long-acting 50 units to 35 units.  Encouraged to avoid taking short acting insulin when not eating.   AKI likely in the setting of decreased p.o. intake as mentioned above.  On admission creatinine of 1.8 which remained stable on repeat.  Unclear what patient's baseline is however he does not report any history of known CKD.  Maintain normal urine output, no urinary symptoms.  Will need follow-up BMP O PCP appointment.  Hypertension, very elevated on admission: 226/2018.  Improved 142/68 on home medications.  Blood pressure at 142/68 on discharge.  Will need to continue to monitor as an outpatient.  Anemia. Hgb of 10 after repeat ( initial hgb of 12) No signs or symptoms of acute blood loss and remained hemodynamically stable. No prior baseline to compare to but patient reports no melena or blood in stool and denies hemoptysis/hemetemesis. Most likely hemoconcentration in setting of dehydration. Would advise repeating CBC as outpatient. Of note last colonoscopy per patient 2 years reported as normal  Consultations:  None   Procedures/Studies: Ct Head Wo Contrast  Result Date: 11/19/2017 CLINICAL DATA:  [place] today hitting head, small laceration to the left posterior scalp EXAM: CT HEAD WITHOUT CONTRAST TECHNIQUE: Contiguous axial images were obtained from the base of the skull  through the vertex without intravenous contrast. COMPARISON:  None. FINDINGS: Brain: The ventricular system is minimally prominent as are the cortical sulci indicating very mild atrophy. The septum is midline in position. Fourth ventricle and basilar cisterns are unremarkable. No hemorrhage, mass lesion, or acute infarction is seen. Vascular: Some calcification of the vertebral arteries is noted. Skull: On bone window images, no calvarial abnormality is seen. Sinuses/Orbits: There is a small amount of fluid layering within the right partition of the sphenoid sinus. No definite basilar skull fracture is evident. Other: None. IMPRESSION: 1. No acute intracranial abnormality.  Mild atrophy. 2. Small amount of fluid layers in the right partition of the sphenoid sinus. No definite basilar skull fracture is seen. Electronically Signed   By: Ivar Drape M.D.   On: 11/19/2017 14:48   TTE: 11/19/17: Study Conclusions  - Left ventricle: The cavity size was normal. There was moderate   concentric hypertrophy. Systolic function was normal. The   estimated ejection fraction was in the range of 60% to 65%. Wall   motion was normal; there were no regional wall motion   abnormalities. Features are consistent with a pseudonormal left   ventricular filling pattern, with concomitant abnormal relaxation   and increased filling pressure (grade 2 diastolic dysfunction). - Aortic valve: Trileaflet; mildly thickened, mildly calcified   leaflets. Transvalvular velocity was minimally increased. There   was no stenosis. - Left atrium: The atrium was moderately dilated. Volume/bsa, S:   44.8 ml/m^2. - Pericardium, extracardiac: A trivial pericardial effusion was   identified posterior to the heart.  Cartoid Duplex bilateral 11/19/17:  Right Carotid: Velocities in the right ICA are consistent with a 1-39% stenosis.        Difficult exam due to high bifrucation and penetration        limitations.  Left  Carotid: Velocities in the left ICA are consistent with a 1-39% stenosis.       Difficult exam due to high bifrucation and penetration       limitations.  Vertebrals: Both vertebral arteries were patent with antegrade flow.  Discharge Exam: BP (!) 142/68   Pulse 74   Temp 98.5 F (36.9 C) (Oral)   Resp 20   Ht 6\' 3"  (1.905 m)   Wt 99 kg (218 lb 4.1 oz)   SpO2 99%   BMI 27.28 kg/m   General: Lying in bed in no distress, Cardiovascular: Regular rate and rhythm, no murmurs rubs or gallops appreciated Respiratory: Lungs clear to auscultation bilaterally breathing well air Neurologic: Alert and oriented to person, place, situation, time. Moving extremities with no focal deficits  Discharge Instructions You were cared for by a hospitalist during your hospital stay. If you have any questions about your discharge medications or the care you received while you were in the hospital after you are discharged, you can call the unit and asked to speak with the hospitalist on call if the hospitalist that took care of you is not available. Once you are discharged, your primary care physician will handle any further medical issues. Please note that NO REFILLS for any discharge medications will be authorized once you are discharged, as it is imperative that you return to your primary care physician (or  establish a relationship with a primary care physician if you do not have one) for your aftercare needs so that they can reassess your need for medications and monitor your lab values.  Discharge Instructions    Diet - low sodium heart healthy   Complete by:  As directed    Increase activity slowly   Complete by:  As directed      Allergies as of 11/20/2017   No Known Allergies     Medication List    TAKE these medications   acetaminophen 500 MG tablet Commonly known as:  TYLENOL Take 1,000 mg by mouth daily as needed for moderate pain.   amLODipine 5 MG tablet Commonly known  as:  NORVASC Take 5 mg by mouth daily.   aspirin EC 81 MG tablet Take 81 mg by mouth daily.   clopidogrel 75 MG tablet Commonly known as:  PLAVIX Take 75 mg by mouth daily.   gabapentin 800 MG tablet Commonly known as:  NEURONTIN Take 800 mg by mouth 3 (three) times daily.   HYDROcodone-acetaminophen 10-325 MG tablet Commonly known as:  NORCO Take 0.5-1 tablets by mouth every 8 (eight) hours as needed for moderate pain.   insulin aspart 100 UNIT/ML injection Commonly known as:  novoLOG Inject 4 Units into the skin 3 (three) times daily as needed for high blood sugar.   insulin degludec 100 UNIT/ML Sopn FlexTouch Pen Commonly known as:  TRESIBA FLEXTOUCH Decrease to 35 U every morning until seen by your Primary care doctor What changed:    how much to take  how to take this  when to take this  additional instructions   metoprolol tartrate 100 MG tablet Commonly known as:  LOPRESSOR Take 100 mg by mouth 2 (two) times daily.   montelukast 10 MG tablet Commonly known as:  SINGULAIR Take 10 mg by mouth at bedtime.   rosuvastatin 10 MG tablet Commonly known as:  CRESTOR Take 1 tablet (10 mg total) by mouth daily at 6 PM. What changed:  when to take this   VITAMIN B 12 PO Take 1 tablet by mouth daily.   VITAMIN D3 PO Take 1,000 Units by mouth daily.   vitamin E 1000 UNIT capsule Generic drug:  vitamin E Take 1,000 Units by mouth daily. 2500 UNITS      No Known Allergies Follow-up Information    [facility]. Schedule an appointment as soon as possible for a visit.   Why:  call within 1 week to make appt Contact information: [address] [place] [address] [phone]           The results of significant diagnostics from this hospitalization (including imaging, microbiology, ancillary and laboratory) are listed below for reference.    Significant Diagnostic Studies: Ct Head Wo  Contrast  Result Date: 11/19/2017 CLINICAL DATA:  [place] today hitting head, small laceration to the left posterior scalp EXAM: CT HEAD WITHOUT CONTRAST TECHNIQUE: Contiguous axial images were obtained from the base of the skull through the vertex without intravenous contrast. COMPARISON:  None. FINDINGS: Brain: The ventricular system is minimally prominent as are the cortical sulci indicating very mild atrophy. The septum is midline in position. Fourth ventricle and basilar cisterns are unremarkable. No hemorrhage, mass lesion, or acute infarction is seen. Vascular: Some calcification of the vertebral arteries is noted. Skull: On bone window images, no calvarial abnormality is seen. Sinuses/Orbits: There is a small amount of fluid layering within the right  partition of the sphenoid sinus. No definite basilar skull fracture is evident. Other: None. IMPRESSION: 1. No acute intracranial abnormality.  Mild atrophy. 2. Small amount of fluid layers in the right partition of the sphenoid sinus. No definite basilar skull fracture is seen. Electronically Signed   By: Ivar Drape M.D.   On: 11/19/2017 14:48    Microbiology: No results found for this or any previous visit (from the past 240 hour(s)).   Labs: Basic Metabolic Panel: Recent Labs  Lab 11/19/17 1442 11/20/17 0509  NA 142 137  K 4.4 4.8  CL 105 105  CO2  --  27  GLUCOSE 64* 186*  BUN 20 24*  CREATININE 1.80* 1.80*  CALCIUM  --  8.2*   Liver Function Tests: Recent Labs  Lab 11/20/17 0509  AST 21  ALT 15*  ALKPHOS 71  BILITOT 0.4  PROT 5.2*  ALBUMIN 2.4*   No results for input(s): LIPASE, AMYLASE in the last 168 hours. No results for input(s): AMMONIA in the last 168 hours. CBC: Recent Labs  Lab 11/19/17 1442 11/20/17 0509  WBC  --  4.6  HGB 12.2* 10.0*  HCT 36.0* 30.6*  MCV  --  85.5  PLT  --  189   Cardiac Enzymes: No results for input(s): CKTOTAL, CKMB, CKMBINDEX, TROPONINI in the last 168 hours. BNP: BNP (last 3  results) No results for input(s): BNP in the last 8760 hours.  ProBNP (last 3 results) No results for input(s): PROBNP in the last 8760 hours.  CBG: Recent Labs  Lab 11/20/17 0409 11/20/17 0653 11/20/17 0744 11/20/17 1034 11/20/17 1215  GLUCAP 193* 139* 135* 159* 146*       Signed:  Desiree Hane, MD [facility] 11/20/2017, 4:34 PM

## 2017-11-20 NOTE — Care Management Note (Signed)
Case Management Note  Patient Details  Name: Ravindra Baranek MRN: [id number] Date of Birth: Jun 24, 1951  Subjective/Objective:66 y/o m admitted w/[place]oglycemia[place] Will stay w/family in [place]. No PCP-provided w/pcp listing [facility]-patient will choose pcp, & make appt. No further CM needs.                    Action/Plan:d/c home.   Expected Discharge Date:  (unknown)               Expected Discharge Plan:  Home/Self Care  In-House Referral:     Discharge planning Services     Post Acute Care Choice:    Choice offered to:     DME Arranged:    DME Agency:     HH Arranged:    [place] Agency:     Status of Service:  Completed, signed off  If discussed at [employer] of Stay Meetings, dates discussed:    Additional Comments:  Dessa Phi, RN 11/20/2017, 3:08 PM

## 2017-11-20 NOTE — Progress Notes (Signed)
  Echocardiogram 2D Echocardiogram has been performed.  William Webb 11/20/2017, 9:58 AM

## 2017-11-20 NOTE — Plan of Care (Signed)
Dr. Text to inform that Hemo/HCT are dropping. 1/22 12.2/36 and today 10/30.6.

## 2017-11-20 NOTE — Discharge Planning (Signed)
Patient IV and tele removed.  RN assessment and VS revealed stability for DC to home.  Discharge papers given, explained and educated.  Informed of suggested FU appt and ask to contact Dr. Gabriel Carina at [facility] on 1/28 to set up first time appt for the following week - per Dr. Gabriel Carina request, since all appt slots for this week are full - Patient informed and agreed. No new scripts needed at this time, since patient is not being given any new meds (just a change in a few dosages.) Patient walked to front and family transporting home via car.

## 2018-03-06 ENCOUNTER — Ambulatory Visit (INDEPENDENT_AMBULATORY_CARE_PROVIDER_SITE_OTHER): Payer: BLUE CROSS/BLUE SHIELD | Admitting: Family Medicine

## 2018-03-06 ENCOUNTER — Encounter: Payer: Self-pay | Admitting: Family Medicine

## 2018-03-06 VITALS — BP 168/98 | HR 66 | Temp 98.2°F | Ht 75.0 in | Wt 204.0 lb

## 2018-03-06 DIAGNOSIS — M1990 Unspecified osteoarthritis, unspecified site: Secondary | ICD-10-CM

## 2018-03-06 DIAGNOSIS — K219 Gastro-esophageal reflux disease without esophagitis: Secondary | ICD-10-CM

## 2018-03-06 DIAGNOSIS — Z794 Long term (current) use of insulin: Secondary | ICD-10-CM

## 2018-03-06 DIAGNOSIS — E1140 Type 2 diabetes mellitus with diabetic neuropathy, unspecified: Secondary | ICD-10-CM | POA: Diagnosis not present

## 2018-03-06 DIAGNOSIS — I10 Essential (primary) hypertension: Secondary | ICD-10-CM

## 2018-03-06 DIAGNOSIS — E782 Mixed hyperlipidemia: Secondary | ICD-10-CM

## 2018-03-06 DIAGNOSIS — Z7689 Persons encountering health services in other specified circumstances: Secondary | ICD-10-CM

## 2018-03-06 DIAGNOSIS — J302 Other seasonal allergic rhinitis: Secondary | ICD-10-CM

## 2018-03-06 DIAGNOSIS — H6122 Impacted cerumen, left ear: Secondary | ICD-10-CM

## 2018-03-06 LAB — CBC WITH DIFFERENTIAL/PLATELET
Basophils Absolute: 0 10*3/uL (ref 0.0–0.1)
Basophils Relative: 0.6 % (ref 0.0–3.0)
Eosinophils Absolute: 0.1 10*3/uL (ref 0.0–0.7)
Eosinophils Relative: 1.9 % (ref 0.0–5.0)
HCT: 32.8 % — ABNORMAL LOW (ref 39.0–52.0)
Hemoglobin: 11.2 g/dL — ABNORMAL LOW (ref 13.0–17.0)
Lymphocytes Relative: 23.5 % (ref 12.0–46.0)
Lymphs Abs: 1 10*3/uL (ref 0.7–4.0)
MCHC: 34.2 g/dL (ref 30.0–36.0)
MCV: 83.3 fl (ref 78.0–100.0)
Monocytes Absolute: 0.4 10*3/uL (ref 0.1–1.0)
Monocytes Relative: 9.1 % (ref 3.0–12.0)
Neutro Abs: 2.7 10*3/uL (ref 1.4–7.7)
Neutrophils Relative %: 64.9 % (ref 43.0–77.0)
Platelets: 204 10*3/uL (ref 150.0–400.0)
RBC: 3.94 Mil/uL — ABNORMAL LOW (ref 4.22–5.81)
RDW: 13.7 % (ref 11.5–15.5)
WBC: 4.2 10*3/uL (ref 4.0–10.5)

## 2018-03-06 LAB — HEMOGLOBIN A1C: Hgb A1c MFr Bld: 9.9 % — ABNORMAL HIGH (ref 4.6–6.5)

## 2018-03-06 LAB — BASIC METABOLIC PANEL
BUN: 27 mg/dL — ABNORMAL HIGH (ref 6–23)
CO2: 35 mEq/L — ABNORMAL HIGH (ref 19–32)
Calcium: 8.9 mg/dL (ref 8.4–10.5)
Chloride: 102 mEq/L (ref 96–112)
Creatinine, Ser: 2.17 mg/dL — ABNORMAL HIGH (ref 0.40–1.50)
GFR: 39.06 mL/min — ABNORMAL LOW (ref >60.00)
Glucose, Bld: 133 mg/dL — ABNORMAL HIGH (ref 70–99)
Potassium: 4.4 mEq/L (ref 3.5–5.1)
Sodium: 141 mEq/L (ref 135–145)

## 2018-03-06 LAB — LIPID PANEL
Cholesterol: 162 mg/dL (ref 0–200)
HDL: 37.6 mg/dL — ABNORMAL LOW (ref >39.00)
LDL Cholesterol: 105 mg/dL — ABNORMAL HIGH (ref 0–99)
NonHDL: 123.93
Total CHOL/HDL Ratio: 4
Triglycerides: 95 mg/dL (ref 0.0–149.0)
VLDL: 19 mg/dL (ref 0.0–40.0)

## 2018-03-06 MED ORDER — MONTELUKAST SODIUM 10 MG PO TABS: 10.0000 mg | ORAL_TABLET | Freq: Every day | ORAL | 3 refills | Status: DC

## 2018-03-06 MED ORDER — ASPIRIN EC 81 MG PO TBEC: 81.0000 mg | DELAYED_RELEASE_TABLET | Freq: Every day | ORAL | 2 refills | Status: DC

## 2018-03-06 MED ORDER — ROSUVASTATIN CALCIUM 10 MG PO TABS: 10.0000 mg | ORAL_TABLET | Freq: Every day | ORAL | 3 refills | Status: DC

## 2018-03-06 MED ORDER — GABAPENTIN 800 MG PO TABS: 800.0000 mg | ORAL_TABLET | Freq: Three times a day (TID) | ORAL | 3 refills | Status: DC

## 2018-03-06 MED ORDER — CLOPIDOGREL BISULFATE 75 MG PO TABS: 75.0000 mg | ORAL_TABLET | Freq: Every day | ORAL | 2 refills | Status: DC

## 2018-03-06 MED ORDER — METOPROLOL TARTRATE 100 MG PO TABS: 100.0000 mg | ORAL_TABLET | Freq: Two times a day (BID) | ORAL | 1 refills | Status: DC

## 2018-03-06 MED ORDER — AMLODIPINE BESYLATE 5 MG PO TABS: 5.0000 mg | ORAL_TABLET | Freq: Every day | ORAL | 2 refills | Status: DC

## 2018-03-06 NOTE — Progress Notes (Signed)
Patient presents to clinic today to establish care.  SUBJECTIVE: PMH: Pt is a 67 yo male with pmh sig for DM II, HTN, GERD, HLD.  Patient was previously seen in [place], [place].  Patient was hospitalized in January for hypoglycemia thought 2/2 taking insulin without eating and dose of Tresiba being to high (was 50 units)  Diabetes: -Patient taking Tresiba 35 units and NovoLog 4 units 3 times daily -Patient states his blood sugars usually around 190.  He does not have his glucometer or blood sugar log with him. -Last eye exam 2018 -Last foot exam 2018  GERD: -Patient diagnosed 12 years ago -Typically takes Prilosec daily but ran out over the weekend -Patient states after running out he had chest pain, burning in his chest for which he took Alka-Seltzer with some relief. -Patient states symptoms are slowly beginning to calm down. -Patient does note certain foods will give him more symptoms than others.  HTN: -Patient states he checks his blood pressure at home but is unable to recall any readings -Patient currently taking Norvasc 5 mg and metoprolol tartrate 100 mg twice daily  HLD: Patient is taking Crestor 10 mg daily -Patient denies myalgias  Previous eye injury: -Patient endorses shavings of steel getting in his eye when he worked in the [employer].  Allergies: NKDA  Past surgical history: Rod in the right leg  Social history: Patient is married.  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  Patient has his GED.  Patient quit smoking Mar 02, 1984.  Patient denies drug use.  Health Maintenance: Colonoscopy --5 years ago.    Past Medical History:  Diagnosis Date  . Diabetes mellitus without complication ([place])   . Neuropathy   . Osteoarthritis     No past surgical history on file.  Current Outpatient Medications on File Prior to Visit  Medication Sig Dispense Refill  . acetaminophen (TYLENOL) 500 MG tablet Take 1,000 mg by mouth  daily as needed for moderate pain.    Marland Kitchen amLODipine (NORVASC) 5 MG tablet Take 5 mg by mouth daily.    Marland Kitchen aspirin EC 81 MG tablet Take 81 mg by mouth daily.    . Cholecalciferol (VITAMIN D3 PO) Take 1,000 Units by mouth daily.    . clopidogrel (PLAVIX) 75 MG tablet Take 75 mg by mouth daily.    . Cyanocobalamin (VITAMIN B 12 PO) Take 1 tablet by mouth daily.    Marland Kitchen gabapentin (NEURONTIN) 800 MG tablet Take 800 mg by mouth 3 (three) times daily.    Marland Kitchen HYDROcodone-acetaminophen (NORCO) 10-325 MG tablet Take 0.5-1 tablets by mouth every 8 (eight) hours as needed for moderate pain.    Marland Kitchen insulin aspart (NOVOLOG) 100 UNIT/ML injection Inject 4 Units into the skin 3 (three) times daily as needed for high blood sugar.    . insulin degludec (TRESIBA FLEXTOUCH) 100 UNIT/ML SOPN FlexTouch Pen Decrease to 35 U every morning until seen by your Primary care doctor    . metoprolol tartrate (LOPRESSOR) 100 MG tablet Take 100 mg by mouth 2 (two) times daily.    . montelukast (SINGULAIR) 10 MG tablet Take 10 mg by mouth at bedtime.    . rosuvastatin (CRESTOR) 10 MG tablet Take 1 tablet (10 mg total) by mouth daily at 6 PM.    . vitamin E (VITAMIN E) 1000 UNIT capsule Take 1,000 Units by mouth daily. 2500 UNITS     No current facility-administered medications on file  prior to visit.     No Known Allergies  No family history on file.  Social History   Socioeconomic History  . Marital status: Legally Separated    Spouse name: Not on file  . Number of children: Not on file  . Years of education: Not on file  . Highest education level: Not on file  Occupational History  . Not on file  Social Needs  . Financial resource strain: Not on file  . Food insecurity:    Worry: Not on file    Inability: Not on file  . Transportation needs:    Medical: Not on file    Non-medical: Not on file  Tobacco Use  . Smoking status: Former Research scientist (life sciences)  . Smokeless tobacco: Never Used  Substance and Sexual Activity  . Alcohol  use: Yes  . Drug use: No  . Sexual activity: Not on file  Lifestyle  . Physical activity:    Days per week: Not on file    Minutes per session: Not on file  . Stress: Not on file  Relationships  . Social connections:    Talks on phone: Not on file    Gets together: Not on file    Attends religious service: Not on file    Active member of club or organization: Not on file    Attends meetings of clubs or organizations: Not on file    Relationship status: Not on file  . Intimate partner violence:    Fear of current or ex partner: Not on file    Emotionally abused: Not on file    Physically abused: Not on file    Forced sexual activity: Not on file  Other Topics Concern  . Not on file  Social History Narrative  . Not on file    ROS General: Denies fever, chills, night sweats, changes in weight, changes in appetite HEENT: Denies headaches, ear pain, changes in vision, rhinorrhea, sore throat CV: Denies CP, palpitations, SOB, orthopnea Pulm: Denies SOB, cough, wheezing GI: Denies abdominal pain, nausea, vomiting, diarrhea, constipation  +reflux GU: Denies dysuria, hematuria, frequency, vaginal discharge Msk: Denies muscle cramps   +joint pains-h/o arthritis Neuro: Denies weakness, numbness, tingling Skin: Denies rashes, bruising Psych: Denies depression, anxiety, hallucinations  BP (!) 168/98 (BP Location: Left Arm, Patient Position: Sitting, Cuff Size: Normal)   Pulse 66   Temp 98.2 F (36.8 C) (Oral)   Ht 6\' 3"  (1.905 m)   Wt 204 lb (92.5 kg)   SpO2 97%   BMI 25.50 kg/m   Physical Exam Gen. Pleasant, well developed, well-nourished, in NAD HEENT - [place]/AT, PERRL, no scleral icterus, no nasal drainage, pharynx without erythema or exudate.  Left TM impacted with cerumen.  Right TM normal.  Left TM normal after irrigation. Neck: No JVD, no thyromegaly, no carotid bruits Lungs: no use of accessory muscle use, CTAB, no wheezes, rales or rhonchi Cardiovascular: RRR, No r/g/m,  no peripheral edema Abdomen: BS present, soft, nontender, nondistended Neuro:  A&Ox3, CN II-XII intact, normal gait Skin:  Warm, dry, intact, no lesions Psych: normal affect, mood appropriate   No results found for this or any previous visit (from the past 2160 hour(s)).  Assessment/Plan: Essential hypertension  -Uncontrolled -Lifestyle modifications encouraged including limiting sodium intake increasing water and physical activity -Patient encouraged to obtain BP cuff for home monitoring and to keep a log to bring with him to clinic. -We will obtain labs -Handouts given -Patient to follow-up in the next 1 to  2 weeks for nurse's visit to follow-up on BP.  Patient is to bring his blood pressure cuff and readings with him to this visit. - Plan: Basic metabolic panel, amLODipine (NORVASC) 5 MG tablet, metoprolol tartrate (LOPRESSOR) 100 MG tablet  Type 2 diabetes mellitus with diabetic neuropathy, with long-term current use of insulin ([place])  -Patient encouraged to keep a record of his fingerstick blood sugar readings to bring with him to each visit. -Continue current regimen Tresiba 35 units and NovoLog 4 units 3 times daily. - Plan: Ambulatory referral to Ophthalmology, Hemoglobin A1c, gabapentin (NEURONTIN) 800 MG tablet  Gastroesophageal reflux disease, esophagitis presence not specified  - Plan: CBC with Differential/Platelet, CBC with Differential/Platelet  Seasonal allergies  - Plan: montelukast (SINGULAIR) 10 MG tablet  Mixed hyperlipidemia  - Plan: Lipid panel, rosuvastatin (CRESTOR) 10 MG tablet  Arthritis  - Plan: Ambulatory referral to Pain Clinic  Impacted cerumen of left ear -Consent obtained, left ear irrigated.  Patient tolerated procedure.  Encounter to establish care -We reviewed the PMH, PSH, FH, SH, Meds and Allergies. -We provided refills for any medications we will prescribe as needed. -We addressed current concerns per orders and patient instructions. -We  have asked for records for pertinent exams, studies, vaccines and notes from previous providers. -We have advised patient to follow up per instructions below.   Follow-up for nurse's visit in the next 2 weeks for BP recheck.  Otherwise follow-up in the next month or 2, sooner if needed.  [name], MD

## 2018-03-06 NOTE — Patient Instructions (Addendum)
Diabetes Mellitus and Nutrition When you have diabetes (diabetes mellitus), it is very important to have healthy eating habits because your blood sugar (glucose) levels are greatly affected by what you eat and drink. Eating healthy foods in the appropriate amounts, at about the same times every day, can help you:  Control your blood glucose.  Lower your risk of heart disease.  Improve your blood pressure.  Reach or maintain a healthy weight.  Every person with diabetes is different, and each person has different needs for a meal plan. Your health care provider may recommend that you work with a diet and nutrition specialist (dietitian) to make a meal plan that is best for you. Your meal plan may vary depending on factors such as:  The calories you need.  The medicines you take.  Your weight.  Your blood glucose, blood pressure, and cholesterol levels.  Your activity level.  Other health conditions you have, such as heart or kidney disease.  How do carbohydrates affect me? Carbohydrates affect your blood glucose level more than any other type of food. Eating carbohydrates naturally increases the amount of glucose in your blood. Carbohydrate counting is a method for keeping track of how many carbohydrates you eat. Counting carbohydrates is important to keep your blood glucose at a healthy level, especially if you use insulin or take certain oral diabetes medicines. It is important to know how many carbohydrates you can safely have in each meal. This is different for every person. Your dietitian can help you calculate how many carbohydrates you should have at each meal and for snack. Foods that contain carbohydrates include:  Bread, cereal, rice, pasta, and crackers.  Potatoes and corn.  Peas, beans, and lentils.  Milk and yogurt.  Fruit and juice.  Desserts, such as cakes, cookies, ice cream, and candy.  How does alcohol affect me? Alcohol can cause a sudden decrease in blood  glucose (hypoglycemia), especially if you use insulin or take certain oral diabetes medicines. Hypoglycemia can be a life-threatening condition. Symptoms of hypoglycemia (sleepiness, dizziness, and confusion) are similar to symptoms of having too much alcohol. If your health care provider says that alcohol is safe for you, follow these guidelines:  Limit alcohol intake to no more than 1 drink per day for nonpregnant women and 2 drinks per day for men. One drink equals 12 oz of beer, 5 oz of wine, or 1 oz of hard liquor.  Do not drink on an empty stomach.  Keep yourself hydrated with water, diet soda, or unsweetened iced tea.  Keep in mind that regular soda, juice, and other mixers may contain a lot of sugar and must be counted as carbohydrates.  What are tips for following this plan? Reading food labels  Start by checking the serving size on the label. The amount of calories, carbohydrates, fats, and other nutrients listed on the label are based on one serving of the food. Many foods contain more than one serving per package.  Check the total grams (g) of carbohydrates in one serving. You can calculate the number of servings of carbohydrates in one serving by dividing the total carbohydrates by 15. For example, if a food has 30 g of total carbohydrates, it would be equal to 2 servings of carbohydrates.  Check the number of grams (g) of saturated and trans fats in one serving. Choose foods that have low or no amount of these fats.  Check the number of milligrams (mg) of sodium in one serving. Most people   should limit total sodium intake to less than 2,300 mg per day.  Always check the nutrition information of foods labeled as "low-fat" or "nonfat". These foods may be higher in added sugar or refined carbohydrates and should be avoided.  Talk to your dietitian to identify your daily goals for nutrients listed on the label. Shopping  Avoid buying canned, premade, or processed foods. These  foods tend to be high in fat, sodium, and added sugar.  Shop around the outside edge of the grocery store. This includes fresh fruits and vegetables, bulk grains, fresh meats, and fresh dairy. Cooking  Use low-heat cooking methods, such as baking, instead of high-heat cooking methods like deep frying.  Cook using healthy oils, such as olive, canola, or sunflower oil.  Avoid cooking with butter, cream, or high-fat meats. Meal planning  Eat meals and snacks regularly, preferably at the same times every day. Avoid going long periods of time without eating.  Eat foods high in fiber, such as fresh fruits, vegetables, beans, and whole grains. Talk to your dietitian about how many servings of carbohydrates you can eat at each meal.  Eat 4-6 ounces of lean protein each day, such as lean meat, chicken, fish, eggs, or tofu. 1 ounce is equal to 1 ounce of meat, chicken, or fish, 1 egg, or 1/4 cup of tofu.  Eat some foods each day that contain healthy fats, such as avocado, nuts, seeds, and fish. Lifestyle   Check your blood glucose regularly.  Exercise at least 30 minutes 5 or more days each week, or as told by your health care provider.  Take medicines as told by your health care provider.  Do not use any products that contain nicotine or tobacco, such as cigarettes and e-cigarettes. If you need help quitting, ask your health care provider.  Work with a Social worker or diabetes educator to identify strategies to manage stress and any emotional and social challenges. What are some questions to ask my health care provider?  Do I need to meet with a diabetes educator?  Do I need to meet with a dietitian?  What number can I call if I have questions?  When are the best times to check my blood glucose? Where to find more information:  American Diabetes Association: diabetes.org/food-and-fitness/food  Academy of Nutrition and Dietetics:  PokerClues.dk  [employer] of Diabetes and Digestive and Kidney Diseases (NIH): ContactWire.be Summary  A healthy meal plan will help you control your blood glucose and maintain a healthy lifestyle.  Working with a diet and nutrition specialist (dietitian) can help you make a meal plan that is best for you.  Keep in mind that carbohydrates and alcohol have immediate effects on your blood glucose levels. It is important to count carbohydrates and to use alcohol carefully. This information is not intended to replace advice given to you by your health care provider. Make sure you discuss any questions you have with your health care provider. Document Released: 07/12/2005 Document Revised: 11/19/2016 Document Reviewed: 11/19/2016 Elsevier Interactive Patient Education  2018 [place] Eating Plan DASH stands for "Dietary Approaches to Stop Hypertension." The DASH eating plan is a healthy eating plan that has been shown to reduce high blood pressure (hypertension). It may also reduce your risk for type 2 diabetes, heart disease, and stroke. The DASH eating plan may also help with weight loss. What are tips for following this plan? General guidelines  Avoid eating more than 2,300 mg (milligrams) of salt (sodium) a day.  If you have hypertension, you may need to reduce your sodium intake to 1,500 mg a day.  Limit alcohol intake to no more than 1 drink a day for nonpregnant women and 2 drinks a day for men. One drink equals 12 oz of beer, 5 oz of wine, or 1 oz of hard liquor.  Work with your health care provider to maintain a healthy body weight or to lose weight. Ask what an ideal weight is for you.  Get at least 30 minutes of exercise that causes your heart to beat faster (aerobic exercise) most days of the week. Activities may include walking, swimming, or  biking.  Work with your health care provider or diet and nutrition specialist (dietitian) to adjust your eating plan to your individual calorie needs. Reading food labels  Check food labels for the amount of sodium per serving. Choose foods with less than 5 percent of the Daily Value of sodium. Generally, foods with less than 300 mg of sodium per serving fit into this eating plan.  To find whole grains, look for the word "whole" as the first word in the ingredient list. Shopping  Buy products labeled as "low-sodium" or "no salt added."  Buy fresh foods. Avoid canned foods and premade or frozen meals. Cooking  Avoid adding salt when cooking. Use salt-free seasonings or herbs instead of table salt or sea salt. Check with your health care provider or pharmacist before using salt substitutes.  Do not fry foods. Cook foods using healthy methods such as baking, boiling, grilling, and broiling instead.  Cook with heart-healthy oils, such as olive, canola, soybean, or sunflower oil. Meal planning   Eat a balanced diet that includes: ? 5 or more servings of fruits and vegetables each day. At each meal, try to fill half of your plate with fruits and vegetables. ? Up to 6-8 servings of whole grains each day. ? Less than 6 oz of lean meat, poultry, or fish each day. A 3-oz serving of meat is about the same size as a deck of cards. One egg equals 1 oz. ? 2 servings of low-fat dairy each day. ? A serving of nuts, seeds, or beans 5 times each week. ? Heart-healthy fats. Healthy fats called Omega-3 fatty acids are found in foods such as flaxseeds and coldwater fish, like sardines, salmon, and mackerel.  Limit how much you eat of the following: ? Canned or prepackaged foods. ? Food that is high in trans fat, such as fried foods. ? Food that is high in saturated fat, such as fatty meat. ? Sweets, desserts, sugary drinks, and other foods with added sugar. ? Full-fat dairy products.  Do not salt  foods before eating.  Try to eat at least 2 vegetarian meals each week.  Eat more home-cooked food and less restaurant, buffet, and fast food.  When eating at a restaurant, ask that your food be prepared with less salt or no salt, if possible. What foods are recommended? The items listed may not be a complete list. Talk with your dietitian about what dietary choices are best for you. Grains Whole-grain or whole-wheat bread. Whole-grain or whole-wheat pasta. Brown rice. [place]. Bulgur. Whole-grain and low-sodium cereals. Pita bread. Low-fat, low-sodium crackers. Whole-wheat flour tortillas. Vegetables Fresh or frozen vegetables (raw, steamed, roasted, or grilled). Low-sodium or reduced-sodium tomato and vegetable juice. Low-sodium or reduced-sodium tomato sauce and tomato paste. Low-sodium or reduced-sodium canned vegetables. Fruits All fresh, dried, or frozen fruit. Canned fruit in natural juice (without  added sugar). Meat and other protein foods Skinless chicken or [place]. Ground chicken or [place]. Pork with fat trimmed off. Fish and seafood. Egg whites. Dried beans, peas, or lentils. Unsalted nuts, nut butters, and seeds. Unsalted canned beans. Lean cuts of beef with fat trimmed off. Low-sodium, lean deli meat. Dairy Low-fat (1%) or fat-free (skim) milk. Fat-free, low-fat, or reduced-fat cheeses. Nonfat, low-sodium ricotta or cottage cheese. Low-fat or nonfat yogurt. Low-fat, low-sodium cheese. Fats and oils Soft margarine without trans fats. Vegetable oil. Low-fat, reduced-fat, or light mayonnaise and salad dressings (reduced-sodium). Canola, safflower, olive, soybean, and sunflower oils. Avocado. Seasoning and other foods Herbs. Spices. Seasoning mixes without salt. Unsalted popcorn and pretzels. Fat-free sweets. What foods are not recommended? The items listed may not be a complete list. Talk with your dietitian about what dietary choices are best for you. Grains Baked goods  made with fat, such as croissants, muffins, or some breads. Dry pasta or rice meal packs. Vegetables Creamed or fried vegetables. Vegetables in a cheese sauce. Regular canned vegetables (not low-sodium or reduced-sodium). Regular canned tomato sauce and paste (not low-sodium or reduced-sodium). Regular tomato and vegetable juice (not low-sodium or reduced-sodium). Angie Fava. Olives. Fruits Canned fruit in a light or heavy syrup. Fried fruit. Fruit in cream or butter sauce. Meat and other protein foods Fatty cuts of meat. Ribs. Fried meat. Berniece Salines. Sausage. Bologna and other processed lunch meats. Salami. Fatback. Hotdogs. Bratwurst. Salted nuts and seeds. Canned beans with added salt. Canned or smoked fish. Whole eggs or egg yolks. Chicken or [place] with skin. Dairy Whole or 2% milk, cream, and half-and-half. Whole or full-fat cream cheese. Whole-fat or sweetened yogurt. Full-fat cheese. Nondairy creamers. Whipped toppings. Processed cheese and cheese spreads. Fats and oils Butter. Stick margarine. Lard. Shortening. Ghee. Bacon fat. Tropical oils, such as coconut, palm kernel, or palm oil. Seasoning and other foods Salted popcorn and pretzels. Onion salt, garlic salt, seasoned salt, table salt, and sea salt. Worcestershire sauce. Tartar sauce. Barbecue sauce. Teriyaki sauce. Soy sauce, including reduced-sodium. Steak sauce. Canned and packaged gravies. Fish sauce. Oyster sauce. Cocktail sauce. Horseradish that you find on the shelf. Ketchup. Mustard. Meat flavorings and tenderizers. Bouillon cubes. Hot sauce and Tabasco sauce. Premade or packaged marinades. Premade or packaged taco seasonings. Relishes. Regular salad dressings. Where to find more information:  National Heart, Lung, and [place]: https://wilson-eaton.com/  American Heart Association: www.heart.org Summary  The DASH eating plan is a healthy eating plan that has been shown to reduce high blood pressure (hypertension). It may also reduce  your risk for type 2 diabetes, heart disease, and stroke.  With the DASH eating plan, you should limit salt (sodium) intake to 2,300 mg a day. If you have hypertension, you may need to reduce your sodium intake to 1,500 mg a day.  When on the DASH eating plan, aim to eat more fresh fruits and vegetables, whole grains, lean proteins, low-fat dairy, and heart-healthy fats.  Work with your health care provider or diet and nutrition specialist (dietitian) to adjust your eating plan to your individual calorie needs. This information is not intended to replace advice given to you by your health care provider. Make sure you discuss any questions you have with your health care provider. Document Released: 10/04/2011 Document Revised: 10/08/2016 Document Reviewed: 10/08/2016 Elsevier Interactive Patient Education  2018 [employer].  Diabetic Neuropathy Diabetic neuropathy is a nerve disease or nerve damage that is caused by diabetes mellitus. About half of all people with diabetes mellitus have  some form of nerve damage. Nerve damage is more common in those who have had diabetes mellitus for many years and who generally have not had good control of their blood sugar (glucose) level. Diabetic neuropathy is a common complication of diabetes mellitus. There are three common types of diabetic neuropathy and a fourth type that is less common and less understood:  Peripheral neuropathy-This is the most common type of diabetic neuropathy. It causes damage to the nerves of the feet and legs first and then eventually the hands and arms. The damage affects the ability to sense touch.  Autonomic neuropathy-This type causes damage to the autonomic nervous system, which controls the following functions: ? Heartbeat. ? Body temperature. ? Blood pressure. ? Urination. ? Digestion. ? Sweating. ? Sexual function.  Focal neuropathy-Focal neuropathy can be painful and unpredictable and occurs most often in older  adults with diabetes mellitus. It involves a specific nerve or one area and often comes on suddenly. It usually does not cause long-term problems.  Radiculoplexus neuropathy- Sometimes called lumbosacral radiculoplexus neuropathy, radiculoplexus neuropathy affects the nerves of the thighs, hips, buttocks, or legs. It is more common in people with type 2 diabetes mellitus and in older men. It is characterized by debilitating pain, weakness, and atrophy, usually in the thigh muscles.  What are the causes? The cause of peripheral, autonomic, and focal neuropathies is diabetes mellitus that is uncontrolled and high glucose levels. The cause of radiculoplexus neuropathy is unknown. However, it is thought to be caused by inflammation related to uncontrolled glucose levels. What are the signs or symptoms? Peripheral Neuropathy Peripheral neuropathy develops slowly over time. When the nerves of the feet and legs no longer work there may be:  Burning, stabbing, or aching pain in the legs or feet.  Inability to feel pressure or pain in your feet. This can lead to: ? Thick calluses over pressure areas. ? Pressure sores. ? Ulcers.  Foot deformities.  Reduced ability to feel temperature changes.  Muscle weakness.  Autonomic Neuropathy The symptoms of autonomic neuropathy vary depending on which nerves are affected. Symptoms may include:  Problems with digestion, such as: ? Feeling sick to your stomach (nausea). ? Vomiting. ? Bloating. ? Constipation. ? Diarrhea. ? Abdominal pain.  Difficulty with urination. This occurs if you lose your ability to sense when your bladder is full. Problems include: ? Urine leakage (incontinence). ? Inability to empty your bladder completely (retention).  Rapid or irregular heartbeat (palpitations).  Blood pressure drops when you stand up (orthostatic hypotension). When you stand up you may feel: ? Dizzy. ? Weak. ? Faint.  In men, inability to attain and  maintain an erection.  In women, vaginal dryness and problems with decreased sexual desire and arousal.  Problems with body temperature regulation.  Increased or decreased sweating.  Focal Neuropathy  Abnormal eye movements or abnormal alignment of both eyes.  Weakness in the wrist.  Foot drop. This results in an inability to lift the foot properly and abnormal walking or foot movement.  Paralysis on one side of your face (Bell palsy).  Chest or abdominal pain. Radiculoplexus Neuropathy  Sudden, severe pain in your hip, thigh, or buttocks.  Weakness and wasting of thigh muscles.  Difficulty rising from a seated position.  Abdominal swelling.  Unexplained weight loss (usually more than 10 lb [4.5 kg]). How is this diagnosed? Peripheral Neuropathy Your senses may be tested. Sensory function testing can be done with:  A light touch using a monofilament.  A  vibration with tuning fork.  A sharp sensation with a pin prick.  Other tests that can help diagnose neuropathy are:  Nerve conduction velocity. This test checks the transmission of an electrical current through a nerve.  Electromyography. This shows how muscles respond to electrical signals transmitted by nearby nerves.  Quantitative sensory testing. This is used to assess how your nerves respond to vibrations and changes in temperature.  Autonomic Neuropathy Diagnosis is often based on reported symptoms. Tell your health care provider if you experience:  Dizziness.  Constipation.  Diarrhea.  Inappropriate urination or inability to urinate.  Inability to get or maintain an erection.  Tests that may be done include:  Electrocardiography or Holter monitor. These are tests that can help show problems with the heart rate or heart rhythm.  An X-ray exam may be done.  Focal Neuropathy Diagnosis is made based on your symptoms and what your health care provider finds during your exam. Other tests may be  done. They may include:  Nerve conduction velocities. This checks the transmission of electrical current through a nerve.  Electromyography. This shows how muscles respond to electrical signals transmitted by nearby nerves.  Quantitative sensory testing. This test is used to assess how your nerves respond to vibration and changes in temperature.  Radiculoplexus Neuropathy  Often the first thing is to eliminate any other issue or problems that might be the cause, as there is no standard test for diagnosis.  X-ray exam of your spine and lumbar region.  Spinal tap to rule out cancer.  MRI to rule out other lesions. How is this treated? Once nerve damage occurs, it cannot be reversed. The goal of treatment is to keep the disease or nerve damage from getting worse and affecting more nerve fibers. Controlling your blood glucose level is the key. Most people with radiculoplexus neuropathy see at least a partial improvement over time. You will need to keep your blood glucose and HbA1c levels in the target range determined by your health care provider. Things that help control blood glucose levels include:  Blood glucose monitoring.  Meal planning.  Physical activity.  Diabetes medicine.  Over time, maintaining lower blood glucose levels helps lessen symptoms. Sometimes, prescription pain medicine is needed. Follow these instructions at home:  Do not smoke.  Keep your blood glucose level in the range that you and your health care provider have determined acceptable for you.  Keep your blood pressure level in the range that you and your health care provider have determined acceptable for you.  Eat a well-balanced diet.  Be physically active every day. Include strength training and balance exercises.  Protect your feet. ? Check your feet every day for sores, cuts, blisters, or signs of infection. ? Wear padded socks and supportive shoes. Use orthotic inserts, if  necessary. ? Regularly check the insides of your shoes for worn spots. Make sure there are no rocks or other items inside your shoes before you put them on. Contact a health care provider if:  You have burning, stabbing, or aching pain in the legs or feet.  You are unable to feel pressure or pain in your feet.  You develop problems with digestion such as: ? Nausea. ? Vomiting. ? Bloating. ? Constipation. ? Diarrhea. ? Abdominal pain.  You have difficulty with urination, such as: ? Incontinence. ? Retention.  You have palpitations.  You develop orthostatic hypotension. When you stand up you may feel: ? Dizzy. ? Weak. ? Faint.  You cannot  attain and maintain an erection (in men).  You have vaginal dryness and problems with decreased sexual desire and arousal (in women).  You have severe pain in your thighs, legs, or buttocks.  You have unexplained weight loss. This information is not intended to replace advice given to you by your health care provider. Make sure you discuss any questions you have with your health care provider. Document Released: 12/24/2001 Document Revised: 03/22/2016 Document Reviewed: 03/26/2013 Elsevier Interactive Patient Education  2017 [place], Adult The ears produce a substance called earwax that helps keep bacteria out of the ear and protects the skin in the ear canal. Occasionally, earwax can build up in the ear and cause discomfort or hearing loss. What increases the risk? This condition is more likely to develop in people who:  Are male.  Are elderly.  Naturally produce more earwax.  Clean their ears often with cotton swabs.  Use earplugs often.  Use in-ear headphones often.  Wear hearing aids.  Have narrow ear canals.  Have earwax that is overly thick or sticky.  Have eczema.  Are dehydrated.  Have excess hair in the ear canal.  What are the signs or symptoms? Symptoms of this condition  include:  Reduced or muffled hearing.  A feeling of fullness in the ear or feeling that the ear is plugged.  Fluid coming from the ear.  Ear pain.  Ear itch.  Ringing in the ear.  Coughing.  An obvious piece of earwax that can be seen inside the ear canal.  How is this diagnosed? This condition may be diagnosed based on:  Your symptoms.  Your medical history.  An ear exam. During the exam, your health care provider will look into your ear with an instrument called an otoscope.  You may have tests, including a hearing test. How is this treated? This condition may be treated by:  Using ear drops to soften the earwax.  Having the earwax removed by a health care provider. The health care provider may: ? Flush the ear with water. ? Use an instrument that has a loop on the end (curette). ? Use a suction device.  Surgery to remove the wax buildup. This may be done in severe cases.  Follow these instructions at home:  Take over-the-counter and prescription medicines only as told by your health care provider.  Do not put any objects, including cotton swabs, into your ear. You can clean the opening of your ear canal with a washcloth or facial tissue.  Follow instructions from your health care provider about cleaning your ears. Do not over-clean your ears.  Drink enough fluid to keep your urine clear or pale yellow. This will help to thin the earwax.  Keep all follow-up visits as told by your health care provider. If earwax builds up in your ears often or if you use hearing aids, consider seeing your health care provider for routine, preventive ear cleanings. Ask your health care provider how often you should schedule your cleanings.  If you have hearing aids, clean them according to instructions from the manufacturer and your health care provider. Contact a health care provider if:  You have ear pain.  You develop a fever.  You have blood, pus, or other fluid coming  from your ear.  You have hearing loss.  You have ringing in your ears that does not go away.  Your symptoms do not improve with treatment.  You feel like the room is spinning (vertigo).  Summary  Earwax can build up in the ear and cause discomfort or hearing loss.  The most common symptoms of this condition include reduced or muffled hearing and a feeling of fullness in the ear or feeling that the ear is plugged.  This condition may be diagnosed based on your symptoms, your medical history, and an ear exam.  This condition may be treated by using ear drops to soften the earwax or by having the earwax removed by a health care provider.  Do not put any objects, including cotton swabs, into your ear. You can clean the opening of your ear canal with a washcloth or facial tissue. This information is not intended to replace advice given to you by your health care provider. Make sure you discuss any questions you have with your health care provider. Document Released: 11/22/2004 Document Revised: 12/26/2016 Document Reviewed: 12/26/2016 Elsevier Interactive Patient Education  2018 [employer] for Gastroesophageal Reflux Disease, Adult When you have gastroesophageal reflux disease (GERD), the foods you eat and your eating habits are very important. Choosing the right foods can help ease the discomfort of GERD. Consider working with a diet and nutrition specialist (dietitian) to help you make healthy food choices. What general guidelines should I follow? Eating plan  Choose healthy foods low in fat, such as fruits, vegetables, whole grains, low-fat dairy products, and lean meat, fish, and poultry.  Eat frequent, small meals instead of three large meals each day. Eat your meals slowly, in a relaxed setting. Avoid bending over or lying down until 2-3 hours after eating.  Limit high-fat foods such as fatty meats or fried foods.  Limit your intake of oils, butter, and shortening  to less than 8 teaspoons each day.  Avoid the following: ? Foods that cause symptoms. These may be different for different people. Keep a food diary to keep track of foods that cause symptoms. ? Alcohol. ? Drinking large amounts of liquid with meals. ? Eating meals during the 2-3 hours before bed.  Cook foods using methods other than frying. This may include baking, grilling, or broiling. Lifestyle   Maintain a healthy weight. Ask your health care provider what weight is healthy for you. If you need to lose weight, work with your health care provider to do so safely.  Exercise for at least 30 minutes on 5 or more days each week, or as told by your health care provider.  Avoid wearing clothes that fit tightly around your waist and chest.  Do not use any products that contain nicotine or tobacco, such as cigarettes and e-cigarettes. If you need help quitting, ask your health care provider.  Sleep with the head of your bed raised. Use a wedge under the mattress or blocks under the bed frame to raise the head of the bed. What foods are not recommended? The items listed may not be a complete list. Talk with your dietitian about what dietary choices are best for you. Grains Pastries or quick breads with added fat. [place] toast. Vegetables Deep fried vegetables. [place] fries. Any vegetables prepared with added fat. Any vegetables that cause symptoms. For some people this may include tomatoes and tomato products, chili peppers, onions and garlic, and horseradish. Fruits Any fruits prepared with added fat. Any fruits that cause symptoms. For some people this may include citrus fruits, such as oranges, grapefruit, pineapple, and lemons. Meats and other protein foods High-fat meats, such as fatty beef or pork, hot dogs, ribs, ham, sausage, salami  and bacon. Fried meat or protein, including fried fish and fried chicken. Nuts and nut butters. Dairy Whole milk and chocolate milk. Sour cream. Cream.  Ice cream. Cream cheese. Milk shakes. Beverages Coffee and tea, with or without caffeine. Carbonated beverages. Sodas. Energy drinks. Fruit juice made with acidic fruits (such as orange or grapefruit). Tomato juice. Alcoholic drinks. Fats and oils Butter. Margarine. Shortening. Ghee. Sweets and desserts Chocolate and cocoa. Donuts. Seasoning and other foods Pepper. Peppermint and spearmint. Any condiments, herbs, or seasonings that cause symptoms. For some people, this may include curry, hot sauce, or vinegar-based salad dressings. Summary  When you have gastroesophageal reflux disease (GERD), food and lifestyle choices are very important to help ease the discomfort of GERD.  Eat frequent, small meals instead of three large meals each day. Eat your meals slowly, in a relaxed setting. Avoid bending over or lying down until 2-3 hours after eating.  Limit high-fat foods such as fatty meat or fried foods. This information is not intended to replace advice given to you by your health care provider. Make sure you discuss any questions you have with your health care provider. Document Released: 10/15/2005 Document Revised: 10/16/2016 Document Reviewed: 10/16/2016 Elsevier Interactive Patient Education  [employer].

## 2018-03-07 ENCOUNTER — Encounter: Payer: Self-pay | Admitting: Family Medicine

## 2018-03-11 ENCOUNTER — Other Ambulatory Visit: Payer: Self-pay | Admitting: Family Medicine

## 2018-03-11 DIAGNOSIS — N183 Chronic kidney disease, stage 3 (moderate): Secondary | ICD-10-CM

## 2018-03-11 DIAGNOSIS — R7989 Other specified abnormal findings of blood chemistry: Secondary | ICD-10-CM

## 2018-03-11 DIAGNOSIS — N1831 Chronic kidney disease, stage 3a: Secondary | ICD-10-CM | POA: Insufficient documentation

## 2018-03-14 ENCOUNTER — Other Ambulatory Visit: Payer: Self-pay

## 2018-03-14 ENCOUNTER — Encounter: Payer: Self-pay | Admitting: *Deleted

## 2018-03-14 ENCOUNTER — Encounter (HOSPITAL_COMMUNITY): Payer: Self-pay | Admitting: Emergency Medicine

## 2018-03-14 ENCOUNTER — Emergency Department (HOSPITAL_COMMUNITY)
Admission: EM | Admit: 2018-03-14 | Discharge: 2018-03-14 | Disposition: A | Discharge status: Home / Self Care | Payer: BLUE CROSS/BLUE SHIELD | Attending: Emergency Medicine | Admitting: Emergency Medicine

## 2018-03-14 DIAGNOSIS — I129 Hypertensive chronic kidney disease with stage 1 through stage 4 chronic kidney disease, or unspecified chronic kidney disease: Secondary | ICD-10-CM | POA: Insufficient documentation

## 2018-03-14 DIAGNOSIS — Y939 Activity, unspecified: Secondary | ICD-10-CM | POA: Diagnosis not present

## 2018-03-14 DIAGNOSIS — Z87891 Personal history of nicotine dependence: Secondary | ICD-10-CM | POA: Insufficient documentation

## 2018-03-14 DIAGNOSIS — Y999 Unspecified external cause status: Secondary | ICD-10-CM | POA: Insufficient documentation

## 2018-03-14 DIAGNOSIS — Z79899 Other long term (current) drug therapy: Secondary | ICD-10-CM | POA: Diagnosis not present

## 2018-03-14 DIAGNOSIS — Z7902 Long term (current) use of antithrombotics/antiplatelets: Secondary | ICD-10-CM | POA: Diagnosis not present

## 2018-03-14 DIAGNOSIS — N183 Chronic kidney disease, stage 3 (moderate): Secondary | ICD-10-CM | POA: Insufficient documentation

## 2018-03-14 DIAGNOSIS — Y92009 Unspecified place in unspecified non-institutional (private) residence as the place of occurrence of the external cause: Secondary | ICD-10-CM | POA: Diagnosis not present

## 2018-03-14 DIAGNOSIS — W228XXA Striking against or struck by other objects, initial encounter: Secondary | ICD-10-CM | POA: Insufficient documentation

## 2018-03-14 DIAGNOSIS — E119 Type 2 diabetes mellitus without complications: Secondary | ICD-10-CM | POA: Insufficient documentation

## 2018-03-14 DIAGNOSIS — S91209A Unspecified open wound of unspecified toe(s) with damage to nail, initial encounter: Secondary | ICD-10-CM

## 2018-03-14 DIAGNOSIS — Z7982 Long term (current) use of aspirin: Secondary | ICD-10-CM | POA: Diagnosis not present

## 2018-03-14 DIAGNOSIS — Z794 Long term (current) use of insulin: Secondary | ICD-10-CM | POA: Diagnosis not present

## 2018-03-14 DIAGNOSIS — S99921A Unspecified injury of right foot, initial encounter: Secondary | ICD-10-CM | POA: Diagnosis present

## 2018-03-14 MED ORDER — CEPHALEXIN 500 MG PO CAPS: 500.0000 mg | ORAL_CAPSULE | Freq: Three times a day (TID) | ORAL | 0 refills | Status: DC

## 2018-03-14 NOTE — ED Triage Notes (Signed)
Pt was getting ready to go to church just prior to arrival and his foot slid in his Crocs and his R great toe nail came off.  Pt is diabetic.

## 2018-03-14 NOTE — ED Provider Notes (Signed)
[facility] Provider Note   CSN: [id number] Arrival date & time: 03/14/18  1943     History   Chief Complaint Chief Complaint  Patient presents with  . toe nail came off    HPI [facility] is a 67 y.o. male.  HPI   67 year old male with history of insulin-dependent diabetes, also on Plavix, presenting for evaluation of toe injury.  Patient report he was putting his foot into his croc earlier tonight and may have injured his right great toe nail.  He did not experience any significant pain but then did notice bleeding on the mat.  He fell since he has history of diabetes, he is worried of potential infection.  Currently denies any significant pain.  He is up-to-date with tetanus.  History of toenail fungus and was taken Lamisil in the past.  Past Medical History:  Diagnosis Date  . Allergy   . Diabetes mellitus without complication ([place])   . Hyperlipidemia   . Hypertension   . Neuropathy   . Osteoarthritis     Patient Active Problem List   Diagnosis Date Noted  . CKD (chronic kidney disease) stage 3, GFR 30-59 ml/min ([facility]) 03/11/2018  . Accident due to mechanical fall without injury 11/20/2017  . AKI (acute kidney injury) ([place]) 11/20/2017  . Anemia 11/20/2017  . Hypoglycemia 11/19/2017  . Essential hypertension 11/19/2017    Past Surgical History:  Procedure Laterality Date  . JOINT REPLACEMENT          Home Medications    Prior to Admission medications   Medication Sig Start Date End Date Taking? Authorizing Provider  acetaminophen (TYLENOL) 500 MG tablet Take 1,000 mg by mouth daily as needed for moderate pain.    [provider]  amLODipine (NORVASC) 5 MG tablet Take 1 tablet (5 mg total) by mouth daily. 03/06/18   Billie Ruddy, MD  aspirin EC 81 MG tablet Take 1 tablet (81 mg total) by mouth daily. 03/06/18   Billie Ruddy, MD  Cholecalciferol (VITAMIN D3 PO) Take 1,000 Units by mouth daily.     [provider]  clopidogrel (PLAVIX) 75 MG tablet Take 1 tablet (75 mg total) by mouth daily. 03/06/18   Billie Ruddy, MD  Cyanocobalamin (VITAMIN B 12 PO) Take 1 tablet by mouth daily.    [provider]  gabapentin (NEURONTIN) 800 MG tablet Take 1 tablet (800 mg total) by mouth 3 (three) times daily. 03/06/18   Billie Ruddy, MD  HYDROcodone-acetaminophen (NORCO) 10-325 MG tablet Take 0.5-1 tablets by mouth every 8 (eight) hours as needed for moderate pain.    [provider]  insulin aspart (NOVOLOG) 100 UNIT/ML injection Inject 4 Units into the skin 3 (three) times daily as needed for high blood sugar.    [provider]  insulin degludec (TRESIBA FLEXTOUCH) 100 UNIT/ML SOPN FlexTouch Pen Decrease to 35 U every morning until seen by your Primary care doctor 11/20/17   Desiree Hane, MD  metoprolol tartrate (LOPRESSOR) 100 MG tablet Take 1 tablet (100 mg total) by mouth 2 (two) times daily. 03/06/18   Billie Ruddy, MD  montelukast (SINGULAIR) 10 MG tablet Take 1 tablet (10 mg total) by mouth at bedtime. 03/06/18   Billie Ruddy, MD  omeprazole (PRILOSEC) 20 MG capsule Take 20 mg by mouth.    [provider]  rosuvastatin (CRESTOR) 10 MG tablet Take 1 tablet (10 mg total) by mouth daily at  6 PM. 03/06/18   Billie Ruddy, MD  vitamin E (VITAMIN E) 1000 UNIT capsule Take 1,000 Units by mouth daily. 2500 UNITS    [provider]    Family History Family History  Problem Relation Age of Onset  . Hypertension Mother   . Diabetes Mother   . Hyperlipidemia Mother   . Hypertension Father   . Hypertension Sister   . Cancer Sister     Social History Social History   Tobacco Use  . Smoking status: Former Research scientist (life sciences)  . Smokeless tobacco: Never Used  Substance Use Topics  . Alcohol use: Yes  . Drug use: No     Allergies   Patient has no known allergies.   Review of Systems Review of Systems  Constitutional: Negative for  fever.  Skin: Positive for wound.     Physical Exam Updated Vital Signs BP (!) 168/95 (BP Location: Right Arm)   Pulse 64   Temp 99.2 F (37.3 C) (Oral)   Resp 16   SpO2 96%   Physical Exam  Constitutional: He appears well-developed and well-nourished. No distress.  HENT:  Head: Atraumatic.  Eyes: Conjunctivae are normal.  Neck: Neck supple.  Neurological: He is alert.  Skin: No rash noted.  Right foot: Toenail on the great toe is thick, partially ripped at the base with dried blood in the affected area.  Minimal tenderness to palpation, no other deformity.  Psychiatric: He has a normal mood and affect.  Nursing note and vitals reviewed.    ED Treatments / Results  Labs (all labs ordered are listed, but only abnormal results are displayed) Labs Reviewed - No data to display  EKG None  Radiology No results found.  Procedures Procedures (including critical care time)  Medications Ordered in ED Medications - No data to display   Initial Impression / Assessment and Plan / ED Course  I have reviewed the triage vital signs and the nursing notes.  Pertinent labs & imaging results that were available during my care of the patient were reviewed by me and considered in my medical decision making (see chart for details).     BP (!) 168/95 (BP Location: Right Arm)   Pulse 64   Temp 99.2 F (37.3 C) (Oral)   Resp 16   SpO2 96%    Final Clinical Impressions(s) / ED Diagnoses   Final diagnoses:  Traumatic avulsion of nail plate of toe, initial encounter    ED Discharge Orders        Ordered    cephALEXin (KEFLEX) 500 MG capsule  3 times daily     03/14/18 2214     10:10 PM Patient here with concern for potential infection after he accidentally ripped his right great toenail while putting on his shoe earlier today.  The great toenail is partially ripped with some bright dried blood noted.  I did discuss options of digital block and remove the nail but patient  declined.  Therefore, will wrapped toe with nonocclusive dressing and Covan.  Since patient has history of diabetes, patient will be discharged home with Keflex to treat for potential infection due to the open wound.  Will provide referral to podiatry if needed.  Recommend follow-up with PCP.  Patient was found to be hypertensive with a blood pressure of 197/97.  Recommend having it rechecked by PCP promptly.  Otherwise patient stable for discharge.   Domenic Moras, PA-C 03/14/18 2216    Sherwood Gambler, MD 03/14/18 [phone]

## 2018-03-19 ENCOUNTER — Other Ambulatory Visit (INDEPENDENT_AMBULATORY_CARE_PROVIDER_SITE_OTHER): Payer: BLUE CROSS/BLUE SHIELD

## 2018-03-19 DIAGNOSIS — R7989 Other specified abnormal findings of blood chemistry: Secondary | ICD-10-CM

## 2018-03-19 LAB — BASIC METABOLIC PANEL
BUN: 30 mg/dL — ABNORMAL HIGH (ref 6–23)
CO2: 31 mEq/L (ref 19–32)
Calcium: 8.7 mg/dL (ref 8.4–10.5)
Chloride: 102 mEq/L (ref 96–112)
Creatinine, Ser: 2.62 mg/dL — ABNORMAL HIGH (ref 0.40–1.50)
GFR: 31.42 mL/min — ABNORMAL LOW (ref >60.00)
Glucose, Bld: 201 mg/dL — ABNORMAL HIGH (ref 70–99)
Potassium: 4.9 mEq/L (ref 3.5–5.1)
Sodium: 140 mEq/L (ref 135–145)

## 2018-03-28 ENCOUNTER — Other Ambulatory Visit: Payer: Self-pay | Admitting: *Deleted

## 2018-03-28 DIAGNOSIS — R7989 Other specified abnormal findings of blood chemistry: Secondary | ICD-10-CM

## 2018-04-07 ENCOUNTER — Encounter: Payer: Self-pay | Admitting: Family Medicine

## 2018-04-07 ENCOUNTER — Ambulatory Visit (INDEPENDENT_AMBULATORY_CARE_PROVIDER_SITE_OTHER): Payer: BLUE CROSS/BLUE SHIELD | Admitting: Family Medicine

## 2018-04-07 VITALS — HR 68 | Temp 98.2°F | Wt 216.0 lb

## 2018-04-07 DIAGNOSIS — Z794 Long term (current) use of insulin: Secondary | ICD-10-CM

## 2018-04-07 DIAGNOSIS — R7989 Other specified abnormal findings of blood chemistry: Secondary | ICD-10-CM

## 2018-04-07 DIAGNOSIS — N183 Chronic kidney disease, stage 3 unspecified: Secondary | ICD-10-CM | POA: Insufficient documentation

## 2018-04-07 DIAGNOSIS — E1122 Type 2 diabetes mellitus with diabetic chronic kidney disease: Secondary | ICD-10-CM | POA: Diagnosis not present

## 2018-04-07 DIAGNOSIS — I10 Essential (primary) hypertension: Secondary | ICD-10-CM

## 2018-04-07 DIAGNOSIS — E119 Type 2 diabetes mellitus without complications: Secondary | ICD-10-CM | POA: Insufficient documentation

## 2018-04-07 LAB — BASIC METABOLIC PANEL
BUN: 26 mg/dL — ABNORMAL HIGH (ref 6–23)
CO2: 30 mEq/L (ref 19–32)
Calcium: 8.6 mg/dL (ref 8.4–10.5)
Chloride: 104 mEq/L (ref 96–112)
Creatinine, Ser: 2.29 mg/dL — ABNORMAL HIGH (ref 0.40–1.50)
GFR: 36.7 mL/min — ABNORMAL LOW (ref >60.00)
Glucose, Bld: 210 mg/dL — ABNORMAL HIGH (ref 70–99)
Potassium: 4.8 mEq/L (ref 3.5–5.1)
Sodium: 140 mEq/L (ref 135–145)

## 2018-04-07 NOTE — Patient Instructions (Addendum)
During last office visit your Norvasc was refilled.  90-day supply with refills was sent to your pharmacy.  You should contact your pharmacy in regards to picking up this medication.   Chronic Kidney Disease, Adult Chronic kidney disease (CKD) occurs when the kidneys become damaged slowly over a long period of time. The kidneys are a pair of organs that do many important jobs in the body, including:  Removing waste and extra fluid from the blood to make urine.  Making hormones that maintain the amount of fluid in tissues and blood vessels.  Maintaining the right amount of fluids and chemicals in the body.  A small amount of kidney damage may not cause problems, but a large amount of damage may make it hard or impossible for the kidneys to work the way they should. If steps are not taken to slow down kidney damage or to stop it from getting worse, the kidneys may stop working permanently (end-stage renal disease or ESRD). Most of the time, CKD does not go away, but it can often be controlled. People who have CKD are usually able to live normal lives. What are the causes? The most common causes of this condition are diabetes and high blood pressure (hypertension). Other causes include:  Heart and blood vessel (cardiovascular) disease.  Kidney diseases, such as: ? Glomerulonephritis. ? Interstitial nephritis. ? Polycystic kidney disease. ? Renal vascular disease.  Diseases that affect the immune system.  Genetic diseases.  Medicines that damage the kidneys, such as anti-inflammatory medicines.  Being around or being in contact with poisonous (toxic) substances.  A kidney or urinary infection that occurs again and again (recurs).  Vasculitis. This is swelling or inflammation of the blood vessels.  A problem with urine flow that may be caused by: ? Cancer. ? Having kidney stones more than one time. ? An enlarged prostate, in males.  What increases the risk? You are more likely to  develop this condition if you:  Are older than age 39.  Are male.  Are African-American, Hispanic, Asian, [place], or American [place].  Are a current or former smoker.  Are obese.  Have a family history of kidney disease or failure.  Often take medicines that are damaging to the kidneys.  What are the signs or symptoms? Symptoms of this condition include:  Swelling (edema) of the face, legs, ankles, or feet.  Tiredness (lethargy) and having less energy.  Nausea or vomiting.  Confusion or trouble concentrating.  Problems with urination, such as: ? Painful or burning feeling during urination. ? Decreased urine production. ? Frequent urination, especially at night. ? Bloody urine.  Muscle twitches and cramps, especially in the legs.  Shortness of breath.  Weakness.  Loss of appetite.  Metallic taste in the mouth.  Trouble sleeping.  Dry, itchy skin.  A low blood count (anemia).  Pale lining of the eyelids and surface of the eye (conjunctiva).  Symptoms develop slowly and may not be obvious until the kidney damage becomes severe. It is possible to have kidney disease for years without having any symptoms. How is this diagnosed? This condition may be diagnosed based on:  Blood tests.  Urine tests.  Imaging tests, such as an ultrasound or CT scan.  A test in which a sample of tissue is removed from the kidneys to be examined under a microscope (kidney biopsy).  These test results will help your health care provider determine how serious the CKD is. How is this treated? There is no  cure for most cases of this condition, but treatment usually relieves symptoms and prevents or slows the progression of the disease. Treatment may include:  Making diet changes, which may require you to avoid alcohol, salty foods (sodium), and foods that are high in potassium, calcium, and protein.  Medicines: ? To lower blood pressure. ? To control blood  glucose. ? To relieve anemia. ? To relieve swelling. ? To protect your bones. ? To improve the balance of electrolytes in your blood.  Removing toxic waste from the body through types of dialysis, if the kidneys can no longer do their job (kidney failure).  Managing any other conditions that are causing your CKD or making it worse.  Follow these instructions at home: Medicines  Take over-the-counter and prescription medicines only as told by your health care provider. The dose of some medicines that you take may need to be adjusted.  Do not take any new medicines unless approved by your health care provider. Many medicines can worsen your kidney damage.  Do not take any vitamin and mineral supplements unless approved by your health care provider. Many nutritional supplements can worsen your kidney damage. General instructions  Follow your prescribed diet as told by your health care provider.  Do not use any products that contain nicotine or tobacco, such as cigarettes and e-cigarettes. If you need help quitting, ask your health care provider.  Monitor and track your blood pressure at home. Report changes in your blood pressure as told by your health care provider.  If you are being treated for diabetes, monitor and track your blood sugar (blood glucose) levels as told by your health care provider.  Maintain a healthy weight. If you need help with this, ask your health care provider.  Start or continue an exercise plan. Exercise at least 30 minutes a day, 5 days a week.  Keep your immunizations up to date as told by your health care provider.  Keep all follow-up visits as told by your health care provider. This is important. Where to find more information:  American Association of Kidney Patients: BombTimer.gl  National Kidney Foundation: www.kidney.[place]: https://mathis.com/  Life Options [facility]: www.lifeoptions.org and  www.kidneyschool.org Contact a health care provider if:  Your symptoms get worse.  You develop new symptoms. Get help right away if:  You develop symptoms of ESRD, which include: ? Headaches. ? Numbness in the hands or feet. ? Easy bruising. ? Frequent hiccups. ? Chest pain. ? Shortness of breath. ? Lack of menstruation, in women.  You have a fever.  You have decreased urine production.  You have pain or bleeding when you urinate. Summary  Chronic kidney disease (CKD) occurs when the kidneys become damaged slowly over a long period of time.  The most common causes of this condition are diabetes and high blood pressure (hypertension).  There is no cure for most cases of this condition, but treatment usually relieves symptoms and prevents or slows the progression of the disease. Treatment may include a combination of medicines and lifestyle changes. This information is not intended to replace advice given to you by your health care provider. Make sure you discuss any questions you have with your health care provider. Document Released: 07/24/2008 Document Revised: 11/22/2016 Document Reviewed: 11/22/2016 Elsevier Interactive Patient Education  2018 [employer].  Diabetes Mellitus and Nutrition When you have diabetes (diabetes mellitus), it is very important to have healthy eating habits because your blood sugar (glucose) levels are greatly affected  by what you eat and drink. Eating healthy foods in the appropriate amounts, at about the same times every day, can help you:  Control your blood glucose.  Lower your risk of heart disease.  Improve your blood pressure.  Reach or maintain a healthy weight.  Every person with diabetes is different, and each person has different needs for a meal plan. Your health care provider may recommend that you work with a diet and nutrition specialist (dietitian) to make a meal plan that is best for you. Your meal plan may vary depending on  factors such as:  The calories you need.  The medicines you take.  Your weight.  Your blood glucose, blood pressure, and cholesterol levels.  Your activity level.  Other health conditions you have, such as heart or kidney disease.  How do carbohydrates affect me? Carbohydrates affect your blood glucose level more than any other type of food. Eating carbohydrates naturally increases the amount of glucose in your blood. Carbohydrate counting is a method for keeping track of how many carbohydrates you eat. Counting carbohydrates is important to keep your blood glucose at a healthy level, especially if you use insulin or take certain oral diabetes medicines. It is important to know how many carbohydrates you can safely have in each meal. This is different for every person. Your dietitian can help you calculate how many carbohydrates you should have at each meal and for snack. Foods that contain carbohydrates include:  Bread, cereal, rice, pasta, and crackers.  Potatoes and corn.  Peas, beans, and lentils.  Milk and yogurt.  Fruit and juice.  Desserts, such as cakes, cookies, ice cream, and candy.  How does alcohol affect me? Alcohol can cause a sudden decrease in blood glucose (hypoglycemia), especially if you use insulin or take certain oral diabetes medicines. Hypoglycemia can be a life-threatening condition. Symptoms of hypoglycemia (sleepiness, dizziness, and confusion) are similar to symptoms of having too much alcohol. If your health care provider says that alcohol is safe for you, follow these guidelines:  Limit alcohol intake to no more than 1 drink per day for nonpregnant women and 2 drinks per day for men. One drink equals 12 oz of beer, 5 oz of wine, or 1 oz of hard liquor.  Do not drink on an empty stomach.  Keep yourself hydrated with water, diet soda, or unsweetened iced tea.  Keep in mind that regular soda, juice, and other mixers may contain a lot of sugar and  must be counted as carbohydrates.  What are tips for following this plan? Reading food labels  Start by checking the serving size on the label. The amount of calories, carbohydrates, fats, and other nutrients listed on the label are based on one serving of the food. Many foods contain more than one serving per package.  Check the total grams (g) of carbohydrates in one serving. You can calculate the number of servings of carbohydrates in one serving by dividing the total carbohydrates by 15. For example, if a food has 30 g of total carbohydrates, it would be equal to 2 servings of carbohydrates.  Check the number of grams (g) of saturated and trans fats in one serving. Choose foods that have low or no amount of these fats.  Check the number of milligrams (mg) of sodium in one serving. Most people should limit total sodium intake to less than 2,300 mg per day.  Always check the nutrition information of foods labeled as "low-fat" or "nonfat". These foods  may be higher in added sugar or refined carbohydrates and should be avoided.  Talk to your dietitian to identify your daily goals for nutrients listed on the label. Shopping  Avoid buying canned, premade, or processed foods. These foods tend to be high in fat, sodium, and added sugar.  Shop around the outside edge of the grocery store. This includes fresh fruits and vegetables, bulk grains, fresh meats, and fresh dairy. Cooking  Use low-heat cooking methods, such as baking, instead of high-heat cooking methods like deep frying.  Cook using healthy oils, such as olive, canola, or sunflower oil.  Avoid cooking with butter, cream, or high-fat meats. Meal planning  Eat meals and snacks regularly, preferably at the same times every day. Avoid going long periods of time without eating.  Eat foods high in fiber, such as fresh fruits, vegetables, beans, and whole grains. Talk to your dietitian about how many servings of carbohydrates you can  eat at each meal.  Eat 4-6 ounces of lean protein each day, such as lean meat, chicken, fish, eggs, or tofu. 1 ounce is equal to 1 ounce of meat, chicken, or fish, 1 egg, or 1/4 cup of tofu.  Eat some foods each day that contain healthy fats, such as avocado, nuts, seeds, and fish. Lifestyle   Check your blood glucose regularly.  Exercise at least 30 minutes 5 or more days each week, or as told by your health care provider.  Take medicines as told by your health care provider.  Do not use any products that contain nicotine or tobacco, such as cigarettes and e-cigarettes. If you need help quitting, ask your health care provider.  Work with a Social worker or diabetes educator to identify strategies to manage stress and any emotional and social challenges. What are some questions to ask my health care provider?  Do I need to meet with a diabetes educator?  Do I need to meet with a dietitian?  What number can I call if I have questions?  When are the best times to check my blood glucose? Where to find more information:  American Diabetes Association: diabetes.org/food-and-fitness/food  Academy of Nutrition and Dietetics: PokerClues.dk  [employer] of Diabetes and Digestive and Kidney Diseases (NIH): ContactWire.be Summary  A healthy meal plan will help you control your blood glucose and maintain a healthy lifestyle.  Working with a diet and nutrition specialist (dietitian) can help you make a meal plan that is best for you.  Keep in mind that carbohydrates and alcohol have immediate effects on your blood glucose levels. It is important to count carbohydrates and to use alcohol carefully. This information is not intended to replace advice given to you by your health care provider. Make sure you discuss any questions you have with your health care  provider. Document Released: 07/12/2005 Document Revised: 11/19/2016 Document Reviewed: 11/19/2016 Elsevier Interactive Patient Education  [employer].

## 2018-04-07 NOTE — Progress Notes (Signed)
Subjective:    Patient ID: William Webb [facility], male    DOB: May 16, 1951, 67 y.o.   MRN: [id number]  No chief complaint on file. pt is accompanied by two of his family members.  HPI Patient was seen today for follow-up on labs.  Patient was seen on 5/9 at which time his creatinine was noted at 2.17.  Repeat BMP on 03/19/2018 with creatinine of 2.62.  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   Pt states he has been taking his medications (pills) but may forget to take his Novolog.  Pt has been eating fried chicken, bologna sandwiches, using mayo instead of salad dressing.  Pt does not have his meter or meds with him.  States bs typically 180, endorses low of 71 for which he had juice and peanut butter.   Pt thinks he is out of norvasc.  Pt does not have med bottles with him at this visit.  Also taking metoprolol 100 mg BID.  States his bp is likely up as he has been worrying about his lab results.  Per family, pt's wife would cook all the time.  Pt now eating what ever is easy.  Pt's family members do not live the close to him so it is not easy for them to check up on him daily.  Past Medical History:  Diagnosis Date  . Allergy   . Diabetes mellitus without complication ([place])   . Hyperlipidemia   . Hypertension   . Neuropathy   . Osteoarthritis     No Known Allergies  ROS General: Denies fever, chills, night sweats, changes in weight, changes in appetite HEENT: Denies headaches, ear pain, changes in vision, rhinorrhea, sore throat CV: Denies CP, palpitations, SOB, orthopnea Pulm: Denies SOB, cough, wheezing GI: Denies abdominal pain, nausea, vomiting, diarrhea, constipation GU: Denies dysuria, hematuria, frequency, vaginal discharge Msk: Denies muscle cramps, joint pains Neuro: Denies weakness, numbness, tingling Skin: Denies rashes, bruising Psych: Denies depression, anxiety, hallucinations     Objective:    Pulse 68, temperature 98.2 F (36.8 C),  temperature source Oral, weight 216 lb (98 kg), SpO2 96 %.  BP 160/100, 170/98   Gen. Pleasant, well-nourished, in no distress, normal affect   HEENT: [place]/AT, face symmetric, no scleral icterus, PERRLA, nares patent without drainage, pharynx without erythema or exudate. Lungs: no accessory muscle use, CTAB, no wheezes or rales Cardiovascular: RRR, no m/r/g, no peripheral edema Abdomen: BS present, soft, NT/ND Neuro:  A&Ox3, CN II-XII intact, normal gait   Wt Readings from Last 3 Encounters:  04/07/18 216 lb (98 kg)  03/06/18 204 lb (92.5 kg)  11/19/17 218 lb 4.1 oz (99 kg)    Lab Results  Component Value Date   WBC 4.2 03/06/2018   HGB 11.2 (L) 03/06/2018   HCT 32.8 (L) 03/06/2018   PLT 204.0 03/06/2018   GLUCOSE 201 (H) 03/19/2018   CHOL 162 03/06/2018   TRIG 95.0 03/06/2018   HDL 37.60 (L) 03/06/2018   LDLCALC 105 (H) 03/06/2018   ALT 15 (L) 11/20/2017   AST 21 11/20/2017   NA 140 03/19/2018   K 4.9 03/19/2018   CL 102 03/19/2018   CREATININE 2.62 (H) 03/19/2018   BUN 30 (H) 03/19/2018   CO2 31 03/19/2018   INR 1.07 11/19/2017   HGBA1C 9.9 (H) 03/06/2018    Assessment/Plan:  Type 2 diabetes mellitus with stage 3 chronic kidney disease, with long-term current use of insulin (HCC)  -continue taking novolog 4  units TID with meals and tresiba 35 u in the am. -pt encouraged to keep a log of his bs and bring it to all appts. -pt advised to make diet changes, decrease saturated fats,  carbohydrates, sweets - Plan: Ambulatory referral to Nephrology, Ambulatory referral to Endocrinology, Ambulatory referral to diabetic education  Essential hypertension -Uncontrolled -bp repeated. -Continue metoprolol 100 mg twice daily, valsartan 320 mg, and Norvasc 5 mg -unsure if pt is compliant with meds. -will likely benefit from H/H or PCS  Elevated serum creatinine  - Plan: Basic Metabolic Panel  F/u in 1 month.  Consider neuropsych eval for memory concern at next  OFV.  [name], MD

## 2018-04-14 ENCOUNTER — Other Ambulatory Visit: Payer: BLUE CROSS/BLUE SHIELD

## 2018-04-22 ENCOUNTER — Ambulatory Visit: Payer: Medicare HMO | Admitting: *Deleted

## 2018-04-25 ENCOUNTER — Other Ambulatory Visit: Payer: Self-pay | Admitting: Family Medicine

## 2018-05-07 ENCOUNTER — Encounter: Payer: Self-pay | Admitting: Family Medicine

## 2018-05-07 ENCOUNTER — Ambulatory Visit (INDEPENDENT_AMBULATORY_CARE_PROVIDER_SITE_OTHER): Payer: BLUE CROSS/BLUE SHIELD | Admitting: Family Medicine

## 2018-05-07 VITALS — BP 168/98 | HR 62 | Temp 98.1°F | Wt 223.0 lb

## 2018-05-07 DIAGNOSIS — I10 Essential (primary) hypertension: Secondary | ICD-10-CM

## 2018-05-07 DIAGNOSIS — N183 Chronic kidney disease, stage 3 unspecified: Secondary | ICD-10-CM

## 2018-05-07 DIAGNOSIS — E1122 Type 2 diabetes mellitus with diabetic chronic kidney disease: Secondary | ICD-10-CM | POA: Diagnosis not present

## 2018-05-07 DIAGNOSIS — R413 Other amnesia: Secondary | ICD-10-CM | POA: Diagnosis not present

## 2018-05-07 DIAGNOSIS — Z794 Long term (current) use of insulin: Secondary | ICD-10-CM

## 2018-05-07 NOTE — Progress Notes (Signed)
Subjective:    Patient ID: William Webb [facility], male    DOB: April 14, 1951, 68 y.o.   MRN: [id number]  No chief complaint on file.   HPI Patient was seen today for f/u.  Pt states he has been doing well since last OFV.  Pt endorses compliance with meds, though admits having difficulty remembering things like appointments.  Pt states he has to put items by the door if he is suppose to do something.  Pt put a bag of trash by his door and forgot about it until he kicked it one day.  Pt missed his Endo appt as he went to the wrong place in the building.  States he went to the nutrition office instead.  Patient also states he has been recording his blood pressure and fsbs readings to bring them with him.  When asked about his wife (lives in [place], [place]) pt is unsure if she will move.  Pt states she has a good job and can retire in a few yrs.  Pt states he moved to [place] after the last time he was hospitalized, he heard a voice telling him to "move with family".  Pt's wife is upset about him choosing to move to [facility].  Patient has still not heard from pain clinic about the referral.  Past Medical History:  Diagnosis Date  . Allergy   . Diabetes mellitus without complication ([place])   . Hyperlipidemia   . Hypertension   . Neuropathy   . Osteoarthritis     No Known Allergies  ROS General: Denies fever, chills, night sweats, changes in weight, changes in appetite  +memory deficit HEENT: Denies headaches, ear pain, changes in vision, rhinorrhea, sore throat CV: Denies CP, palpitations, SOB, orthopnea Pulm: Denies SOB, cough, wheezing GI: Denies abdominal pain, nausea, vomiting, diarrhea, constipation GU: Denies dysuria, hematuria, frequency, vaginal discharge Msk: Denies muscle cramps, joint pains Neuro: Denies weakness, numbness, tingling Skin: Denies rashes, bruising Psych: Denies depression, anxiety, hallucinations     Objective:    Blood pressure (!) 168/98, pulse 62, temperature 98.1 F  (36.7 C), temperature source Oral, weight 223 lb (101.2 kg), SpO2 95 %.   Gen. Pleasant, well-nourished, in no distress, normal affect   HEENT: [place]/AT, face symmetric, no scleral icterus, PERRLA, drainage Lungs: no accessory muscle use Cardiovascular: RRR, no peripheral edema Neuro:  A&Ox3, CN II-XII intact, normal gait Skin:  Warm, no lesions/ rash   Wt Readings from Last 3 Encounters:  05/07/18 223 lb (101.2 kg)  04/07/18 216 lb (98 kg)  03/06/18 204 lb (92.5 kg)    Lab Results  Component Value Date   WBC 4.2 03/06/2018   HGB 11.2 (L) 03/06/2018   HCT 32.8 (L) 03/06/2018   PLT 204.0 03/06/2018   GLUCOSE 210 (H) 04/07/2018   CHOL 162 03/06/2018   TRIG 95.0 03/06/2018   HDL 37.60 (L) 03/06/2018   LDLCALC 105 (H) 03/06/2018   ALT 15 (L) 11/20/2017   AST 21 11/20/2017   NA 140 04/07/2018   K 4.8 04/07/2018   CL 104 04/07/2018   CREATININE 2.29 (H) 04/07/2018   BUN 26 (H) 04/07/2018   CO2 30 04/07/2018   INR 1.07 11/19/2017   HGBA1C 9.9 (H) 03/06/2018    Assessment/Plan:  Memory deficit -Unable to perform MMSE or other test as patient becomes sidetracked when asked direct questions. - Plan: Ambulatory referral to Neurology for neuropsych testing  Essential hypertension -elevated -not sure pt remembered to take his meds this am. -family not  present at this appointment to check. -pt encouraged to eat less sodium and take meds  Type 2 diabetes mellitus with stage 3 chronic kidney disease, with long-term current use of insulin (HCC) -Continue Tresiba 50 units and NovoLog 4 units 3 times daily  Follow-up in 1 month  [name], MD

## 2018-05-12 ENCOUNTER — Encounter: Payer: Self-pay | Admitting: Physical Medicine & Rehabilitation

## 2018-05-12 ENCOUNTER — Encounter: Payer: Self-pay | Admitting: Psychology

## 2018-05-27 ENCOUNTER — Other Ambulatory Visit: Payer: Self-pay | Admitting: Family Medicine

## 2018-05-28 NOTE — Telephone Encounter (Signed)
Ok to refill for 90 days  

## 2018-05-28 NOTE — Telephone Encounter (Signed)
Medication filled to pharmacy as requested.   

## 2018-05-28 NOTE — Telephone Encounter (Signed)
Refill request received for pen needles for [place]. Needles are not on current med list. Can you advise on refill in PCP absence?

## 2018-05-29 ENCOUNTER — Encounter
Payer: BLUE CROSS/BLUE SHIELD | Attending: Physical Medicine & Rehabilitation | Admitting: Physical Medicine & Rehabilitation

## 2018-05-29 ENCOUNTER — Encounter: Payer: Self-pay | Admitting: Physical Medicine & Rehabilitation

## 2018-05-29 ENCOUNTER — Other Ambulatory Visit: Payer: Self-pay

## 2018-05-29 DIAGNOSIS — Z6828 Body mass index (BMI) 28.0-28.9, adult: Secondary | ICD-10-CM | POA: Insufficient documentation

## 2018-05-29 DIAGNOSIS — R296 Repeated falls: Secondary | ICD-10-CM | POA: Diagnosis not present

## 2018-05-29 DIAGNOSIS — G894 Chronic pain syndrome: Secondary | ICD-10-CM

## 2018-05-29 DIAGNOSIS — E785 Hyperlipidemia, unspecified: Secondary | ICD-10-CM | POA: Insufficient documentation

## 2018-05-29 DIAGNOSIS — F09 Unspecified mental disorder due to known physiological condition: Secondary | ICD-10-CM | POA: Insufficient documentation

## 2018-05-29 DIAGNOSIS — R269 Unspecified abnormalities of gait and mobility: Secondary | ICD-10-CM | POA: Diagnosis not present

## 2018-05-29 DIAGNOSIS — I129 Hypertensive chronic kidney disease with stage 1 through stage 4 chronic kidney disease, or unspecified chronic kidney disease: Secondary | ICD-10-CM | POA: Insufficient documentation

## 2018-05-29 DIAGNOSIS — R4189 Other symptoms and signs involving cognitive functions and awareness: Secondary | ICD-10-CM

## 2018-05-29 DIAGNOSIS — Z87891 Personal history of nicotine dependence: Secondary | ICD-10-CM | POA: Diagnosis not present

## 2018-05-29 DIAGNOSIS — E1142 Type 2 diabetes mellitus with diabetic polyneuropathy: Secondary | ICD-10-CM | POA: Insufficient documentation

## 2018-05-29 DIAGNOSIS — N189 Chronic kidney disease, unspecified: Secondary | ICD-10-CM | POA: Diagnosis not present

## 2018-05-29 DIAGNOSIS — R200 Anesthesia of skin: Secondary | ICD-10-CM | POA: Diagnosis not present

## 2018-05-29 DIAGNOSIS — M159 Polyosteoarthritis, unspecified: Secondary | ICD-10-CM | POA: Diagnosis present

## 2018-05-29 DIAGNOSIS — R531 Weakness: Secondary | ICD-10-CM | POA: Insufficient documentation

## 2018-05-29 DIAGNOSIS — R350 Frequency of micturition: Secondary | ICD-10-CM | POA: Diagnosis not present

## 2018-05-29 DIAGNOSIS — G479 Sleep disorder, unspecified: Secondary | ICD-10-CM | POA: Diagnosis not present

## 2018-05-29 DIAGNOSIS — E1122 Type 2 diabetes mellitus with diabetic chronic kidney disease: Secondary | ICD-10-CM | POA: Insufficient documentation

## 2018-05-29 DIAGNOSIS — R042 Hemoptysis: Secondary | ICD-10-CM | POA: Diagnosis not present

## 2018-05-29 DIAGNOSIS — Z8249 Family history of ischemic heart disease and other diseases of the circulatory system: Secondary | ICD-10-CM | POA: Insufficient documentation

## 2018-05-29 DIAGNOSIS — Z833 Family history of diabetes mellitus: Secondary | ICD-10-CM | POA: Insufficient documentation

## 2018-05-29 MED ORDER — GABAPENTIN 600 MG PO TABS: 1200.0000 mg | ORAL_TABLET | Freq: Three times a day (TID) | ORAL | 1 refills | Status: DC

## 2018-05-29 MED ORDER — DICLOFENAC SODIUM 1 % TD GEL: 2.0000 g | Freq: Four times a day (QID) | TRANSDERMAL | 1 refills | Status: DC

## 2018-05-29 MED ORDER — METHOCARBAMOL 500 MG PO TABS: 500.0000 mg | ORAL_TABLET | Freq: Two times a day (BID) | ORAL | 1 refills | Status: DC | PRN

## 2018-05-29 NOTE — Progress Notes (Signed)
Subjective:    Patient ID: William Webb [facility], male    DOB: [birth year], 67 y.o.   MRN: [id number]  HPI 67 y/o male with pmh/psh of OA - right hip, knee, and back, diabetic peripheral neuropathy, HTN, CKD, right knee TKA in 2015 memory issues presents with arthritis and neuropathy.  Started ~2010 after working in [employer] with multiple falls.  Getting worse.  Hydrocodone helps.  Tylenol helps a little.  Denies aggravating factors.  Burning, stabbing, tingling. ?Radiation.  Intermittent.  Associated weakness and numbness. Mechanical falls. Pain prevents ADLs. Of note, PCP referred for Neuropsych testing for memory less. Pt recently relocated from [place], [place].  Pain Inventory Average Pain 10 Pain Right Now 5 My pain is burning, stabbing and tingling  In the last 24 hours, has pain interfered with the following? General activity 8 Relation with others 8 Enjoyment of life 8 What TIME of day is your pain at its worst? night Sleep (in general) Fair  Pain is worse with: walking and sitting Pain improves with: rest, therapy/exercise and medication Relief from Meds: 7  Mobility walk without assistance use a cane ability to climb steps?  no do you drive?  yes  Function disabled: date disabled n/a retired I need assistance with the following:  household duties and shopping  Neuro/Psych bladder control problems tingling dizziness confusion  Prior Studies Any changes since last visit?  yes x-rays CT/MRI  Physicians involved in your care Any changes since last visit?  no Primary care Dr. [name]   Family History  Problem Relation Age of Onset  . Hypertension Mother   . Diabetes Mother   . Hyperlipidemia Mother   . Hypertension Father   . Hypertension Sister   . Cancer Sister    Social History   Socioeconomic History  . Marital status: Legally Separated    Spouse name: Not on file  . Number of children: Not on file  . Years of education: Not on file    . Highest education level: Not on file  Occupational History  . Not on file  Social Needs  . Financial resource strain: Not on file  . Food insecurity:    Worry: Not on file    Inability: Not on file  . Transportation needs:    Medical: Not on file    Non-medical: Not on file  Tobacco Use  . Smoking status: Former Research scientist (life sciences)  . Smokeless tobacco: Never Used  Substance and Sexual Activity  . Alcohol use: Yes  . Drug use: No  . Sexual activity: Not on file  Lifestyle  . Physical activity:    Days per week: Not on file    Minutes per session: Not on file  . Stress: Not on file  Relationships  . Social connections:    Talks on phone: Not on file    Gets together: Not on file    Attends religious service: Not on file    Active member of club or organization: Not on file    Attends meetings of clubs or organizations: Not on file    Relationship status: Not on file  Other Topics Concern  . Not on file  Social History Narrative  . Not on file   Past Surgical History:  Procedure Laterality Date  . JOINT REPLACEMENT     Past Medical History:  Diagnosis Date  . Allergy   . Diabetes mellitus without complication ([place])   . Hyperlipidemia   . Hypertension   . Neuropathy   .  Osteoarthritis    BP (!) 176/94   Pulse 65   Ht 6\' 3"  (1.905 m)   Wt 224 lb 9.6 oz (101.9 kg)   SpO2 95%   BMI 28.07 kg/m   Opioid Risk Score:   Fall Risk Score:  `1  Depression screen PHQ 2/9  Depression screen [facility] 2/9 05/29/2018 03/06/2018  Decreased Interest 1 0  Down, Depressed, Hopeless 1 0  PHQ - 2 Score 2 0  Altered sleeping 3 -  Tired, decreased energy 3 -  Change in appetite 1 -  Feeling bad or failure about yourself  0 -  Trouble concentrating 0 -  Suicidal thoughts 0 -  PHQ-9 Score 9 -  Difficult doing work/chores Somewhat difficult -    Review of Systems  Constitutional: Positive for unexpected weight change.       Night sweats  HENT: Negative.   Eyes: Negative.    Respiratory: Positive for cough (Bloody).   Cardiovascular: Negative.   Gastrointestinal: Negative.   Endocrine: Negative.   Genitourinary: Positive for urgency.  Musculoskeletal: Positive for arthralgias, back pain, gait problem, joint swelling and myalgias.  Skin: Positive for rash.  Allergic/Immunologic: Negative.   Neurological: Positive for weakness and numbness.  Hematological: Negative.   Psychiatric/Behavioral: Positive for confusion.  All other systems reviewed and are negative.     Objective:   Physical Exam Gen: NAD. Vital signs reviewed HENT: Normocephalic, Atraumatic Eyes: EOMI. No discharge.  Cardio: RRR. No JVD. Pulm: B/l clear to auscultation.  Effort normal Abd: Soft, BS+ MSK:  Gait antalgic.   TTP diffusely in joints.    No edema.  Neuro:  Sensation subjectively diminished to light touch b/l LE  Strength  4-/5 in all LE myotomes (pain inhibition) Skin: Warm and Dry. Intact Psych: Verbose    Assessment & Plan:  67 y/o male with pmh/psh of OA - right hip, knee, and back, diabetic peripheral neuropathy, HTN, CKD, right knee TKA in 2015 memory issues presents with arthritis and neuropathy.  1. Generalized OA  MRI requested from former PCP  Labs reviewed - CKD  Limited referral information reviewed  PMAWARE reviewed  Cont heat  Will refer for aquatic therapy  Will order Voltaren gel  Will order Robaxin 500 BID PRN  Will increase Gabapentin to 1200 TID  Will hold off on trials of TENS until prostate/pulm workup  Will consider Lidoderm  Will consider Cymbalta after Neuropsych testing  Will consider referral to Psychology  Will consider bracing   2. Gait abnormality  Will order cane  Will refer for therapy  3. Sleep disturbance  See #1  Encouraged Melatonin  Will consider Elavil after Urology evaluation  4. Morbid Obesity  Pt to see dietitian  5. Diabetic Neuropathy  See #1  6. Cognitive deficits  CT head reviewed 10/2017, unremarkable for  acute changes  Neuropsych testing ordered per PCP  7. Bloody sputum  Encouraged follow up with PCP/Pulm  8. Urinary frequency  Encouraged follow up with PCP/Urology

## 2018-06-06 ENCOUNTER — Encounter: Payer: Self-pay | Admitting: Family Medicine

## 2018-06-06 ENCOUNTER — Ambulatory Visit (INDEPENDENT_AMBULATORY_CARE_PROVIDER_SITE_OTHER): Payer: BLUE CROSS/BLUE SHIELD | Admitting: Family Medicine

## 2018-06-06 ENCOUNTER — Ambulatory Visit (INDEPENDENT_AMBULATORY_CARE_PROVIDER_SITE_OTHER): Payer: Medicare HMO

## 2018-06-06 VITALS — BP 128/88 | HR 68 | Temp 98.2°F | Wt 222.0 lb

## 2018-06-06 DIAGNOSIS — R61 Generalized hyperhidrosis: Secondary | ICD-10-CM | POA: Diagnosis not present

## 2018-06-06 DIAGNOSIS — R6883 Chills (without fever): Secondary | ICD-10-CM

## 2018-06-06 DIAGNOSIS — R042 Hemoptysis: Secondary | ICD-10-CM

## 2018-06-06 LAB — CBC WITH DIFFERENTIAL/PLATELET
Basophils Absolute: 0 10*3/uL (ref 0.0–0.1)
Basophils Relative: 1 % (ref 0.0–3.0)
Eosinophils Absolute: 0.1 10*3/uL (ref 0.0–0.7)
Eosinophils Relative: 2.9 % (ref 0.0–5.0)
HCT: 31.8 % — ABNORMAL LOW (ref 39.0–52.0)
Hemoglobin: 10.8 g/dL — ABNORMAL LOW (ref 13.0–17.0)
Lymphocytes Relative: 21.4 % (ref 12.0–46.0)
Lymphs Abs: 0.7 10*3/uL (ref 0.7–4.0)
MCHC: 34 g/dL (ref 30.0–36.0)
MCV: 85.7 fl (ref 78.0–100.0)
Monocytes Absolute: 0.3 10*3/uL (ref 0.1–1.0)
Monocytes Relative: 10.1 % (ref 3.0–12.0)
Neutro Abs: 2.2 10*3/uL (ref 1.4–7.7)
Neutrophils Relative %: 64.6 % (ref 43.0–77.0)
Platelets: 204 10*3/uL (ref 150.0–400.0)
RBC: 3.72 Mil/uL — ABNORMAL LOW (ref 4.22–5.81)
RDW: 14.5 % (ref 11.5–15.5)
WBC: 3.5 10*3/uL — ABNORMAL LOW (ref 4.0–10.5)

## 2018-06-06 NOTE — Progress Notes (Signed)
Subjective:    Patient ID: [facility], male    DOB: 28-Apr-1951, 67 y.o.   MRN: [id number]  No chief complaint on file.   HPI Patient was seen today for f/u.  Pt states he has been coughing up blood every am x 1 mo.  Pt states it is brb noticeable on tissue.  Pt also endorses night sweats and chills.  Pt denies coughing during day or recent travel.  Pt worked in a Advice worker for yrs.  Pt states  He forgot to mention the above at the last OFV.  Pt did mention at [facility] with PMR on 8/1.  Pt states he accidentally gave himself an extra dose of tresiba.  He drank sugar water, ginger ale and tried to eat to keep his bs up.  Pt notes the appt for neuropsych testing isn't until Jan 2020.    Past Medical History:  Diagnosis Date  . Allergy   . Diabetes mellitus without complication ([place])   . Hyperlipidemia   . Hypertension   . Neuropathy   . Osteoarthritis     No Known Allergies  ROS General: Denies fever, changes in weight, changes in appetite  +memory deficit, chills, night sweats HEENT: Denies headaches, ear pain, changes in vision, rhinorrhea, sore throat CV: Denies CP, palpitations, SOB, orthopnea Pulm: Denies SOB, wheezing  +hemoptysis in am, cough  GI: Denies abdominal pain, nausea, vomiting, diarrhea, constipation GU: Denies dysuria, hematuria, frequency, vaginal discharge Msk: Denies muscle cramps, joint pains Neuro: Denies weakness, numbness, tingling Skin: Denies rashes, bruising Psych: Denies depression, anxiety, hallucinations     Objective:    Blood pressure 128/88, pulse 68, temperature 98.2 F (36.8 C), temperature source Oral, weight 222 lb (100.7 kg), SpO2 97 %.   Gen. Pleasant, well-nourished, in no distress, normal affect  HEENT: [place]/AT, face symmetric, no scleral icterus, PERRLA, nares patent without drainage, pharynx without erythema or exudate. Lungs: not coughing, no accessory muscle use, CTAB, no wheezes or rales Cardiovascular: RRR, no m/r/g, no  peripheral edema Neuro:  A&Ox3, CN II-XII intact, normal gait   Wt Readings from Last 3 Encounters:  06/06/18 222 lb (100.7 kg)  05/29/18 224 lb 9.6 oz (101.9 kg)  05/07/18 223 lb (101.2 kg)    Lab Results  Component Value Date   WBC 4.2 03/06/2018   HGB 11.2 (L) 03/06/2018   HCT 32.8 (L) 03/06/2018   PLT 204.0 03/06/2018   GLUCOSE 210 (H) 04/07/2018   CHOL 162 03/06/2018   TRIG 95.0 03/06/2018   HDL 37.60 (L) 03/06/2018   LDLCALC 105 (H) 03/06/2018   ALT 15 (L) 11/20/2017   AST 21 11/20/2017   NA 140 04/07/2018   K 4.8 04/07/2018   CL 104 04/07/2018   CREATININE 2.29 (H) 04/07/2018   BUN 26 (H) 04/07/2018   CO2 30 04/07/2018   INR 1.07 11/19/2017   HGBA1C 9.9 (H) 03/06/2018    Assessment/Plan:  Cough with hemoptysis  -h/o working in Advice worker -ddx: TB, pna, PE, malignancy -Wells score 1 (hemoptysis). - Plan: DG Chest 2 View, QuantiFERON-TB Gold Plus, D-dimer, Quantitative -consider referral to pulm based on above.  Night sweats  - Plan: DG Chest 2 View, QuantiFERON-TB Gold Plus, CBC with Differential/Platelet  Chills  - Plan: DG Chest 2 View, QuantiFERON-TB Gold Plus, CBC with Differential/Platelet  F/u prn  [name], MD

## 2018-06-06 NOTE — Patient Instructions (Signed)
Hemoptysis °Hemoptysis is when you cough up blood. It can be mild or serious. If it is mild, you may cough up bloody spit and mucus (sputum). If you cough up 1-2 cups (240-480 mL) of blood within 24 hours (massive hemoptysis), it is an emergency. °If you cough up blood, it is important to go and see your doctor. °Follow these instructions at home: °· Watch your condition for any changes. °· Take over-the-counter and prescription medicines only as told by your doctor. °· If you were prescribed an antibiotic medicine, take it as told by your doctor. Do not stop taking the antibiotic even if you start to feel better. °· Go back to your normal activities as told by your doctor. Ask your doctor what activities are safe for you to do. °· Do not use any products that contain nicotine or tobacco. These include cigarettes and e-cigarettes. If you need help quitting, ask your doctor. °· Keep all follow-up visits as told by your doctor. This is important. °Contact a doctor if: °· You have a fever. °· You cough up bloody spit and mucus. °Get help right away if: °· You cough up fresh blood or blood clots. °· You have trouble breathing. °· You have chest pain. °This information is not intended to replace advice given to you by your health care provider. Make sure you discuss any questions you have with your health care provider. °Document Released: 10/01/2012 Document Revised: 07/13/2016 Document Reviewed: 07/13/2016 °Elsevier Interactive Patient Education © 2018 Elsevier Inc. ° °

## 2018-06-07 LAB — D-DIMER, QUANTITATIVE: D-Dimer, Quant: 0.55 mcg/mL FEU — ABNORMAL HIGH (ref <0.50)

## 2018-06-07 LAB — QUANTIFERON-TB GOLD PLUS
Mitogen-NIL: >10 IU/mL
NIL: 0.06 IU/mL
QuantiFERON-TB Gold Plus: NEGATIVE
TB1-NIL: <0 IU/mL
TB2-NIL: <0 IU/mL

## 2018-06-11 ENCOUNTER — Other Ambulatory Visit: Payer: Self-pay | Admitting: Family Medicine

## 2018-06-11 ENCOUNTER — Ambulatory Visit: Payer: Medicare HMO | Admitting: Endocrinology

## 2018-06-11 DIAGNOSIS — R042 Hemoptysis: Secondary | ICD-10-CM

## 2018-06-16 ENCOUNTER — Institutional Professional Consult (permissible substitution): Payer: BLUE CROSS/BLUE SHIELD | Admitting: Pulmonary Disease

## 2018-06-16 NOTE — Progress Notes (Deleted)
Patient ID: William Webb [facility], male   DOB: 05-27-1951, 67 y.o.   MRN: [id number]          Reason for Appointment: Consultation for Type 2 Diabetes  Referring physician:   History of Present Illness:          Date of diagnosis of type 2 diabetes mellitus:        Background history:    Recent history:   Most recent A1c is  INSULIN regimen is:       Non-insulin hypoglycemic drugs the patient is taking are:  Current management, blood sugar patterns and problems identified:            Side effects from medications have been:  Compliance with the medical regimen:      Meal times are:  Breakfast is at Lunch: Dinner:    Typical meal intake: Breakfast is                Exercise:    Glucose monitoring:  done  times a day         Glucometer: One Touch.       Blood Glucose readings by time of day and averages from meter download:  PREMEAL Breakfast Lunch Dinner Bedtime  Overall   Glucose range:       Median:        POST-MEAL PC Breakfast PC Lunch PC Dinner  Glucose range:     Median:       Dietician visit, most recent:  Weight history:  Wt Readings from Last 3 Encounters:  06/06/18 222 lb (100.7 kg)  05/29/18 224 lb 9.6 oz (101.9 kg)  05/07/18 223 lb (101.2 kg)    Glycemic control:   Lab Results  Component Value Date   HGBA1C 9.9 (H) 03/06/2018   HGBA1C 6.4 (H) 11/20/2017   Lab Results  Component Value Date   LDLCALC 105 (H) 03/06/2018   CREATININE 2.29 (H) 04/07/2018   No results found for: MICRALBCREAT  No results found for: FRUCTOSAMINE  No visits with results within 1 Week(s) from this visit.  Latest known visit with results is:  Office Visit on 06/06/2018  Component Date Value Ref Range Status  . QuantiFERON-TB Gold Plus 06/06/2018 NEGATIVE  NEGATIVE Final   Comment: Negative test result. M. tuberculosis complex  infection unlikely.   Marland Kitchen NIL 06/06/2018 0.06  IU/mL Final  . Mitogen-NIL 06/06/2018 >10.00  IU/mL Final  . TB1-NIL  06/06/2018 <0.00  IU/mL Final  . TB2-NIL 06/06/2018 <0.00  IU/mL Final   Comment: . The Nil tube value reflects the background interferon gamma immune response of the patient's blood sample. This value has been subtracted from the patient's displayed TB and Mitogen results. . Lower than expected results with the Mitogen tube prevent false-negative Quantiferon readings by detecting a patient with a potential immune suppressive condition and/or suboptimal pre-analytical specimen handling. . The TB1 Antigen tube is coated with the M. tuberculosis-specific antigens designed to elicit responses from TB antigen primed CD4+ helper T-lymphocytes. . The TB2 Antigen tube is coated with the M. tuberculosis-specific antigens designed to elicit responses from TB antigen primed CD4+ helper and CD8+ cytotoxic T-lymphocytes. . For additional information, please refer to https://education.questdiagnostics.com/faq/FAQ204 (This link is being provided for informational/ educational purposes only.) .   Marland Kitchen WBC 06/06/2018 3.5* 4.0 - 10.5 K/uL Final  . RBC 06/06/2018 3.72* 4.22 - 5.81 Mil/uL Final  . Hemoglobin 06/06/2018 10.8* 13.0 - 17.0 g/dL Final  . HCT 06/06/2018 31.8* 39.0 - 52.0 %  Final  . MCV 06/06/2018 85.7  78.0 - 100.0 fl Final  . MCHC 06/06/2018 34.0  30.0 - 36.0 g/dL Final  . RDW 06/06/2018 14.5  11.5 - 15.5 % Final  . Platelets 06/06/2018 204.0  150.0 - 400.0 K/uL Final  . Neutrophils Relative % 06/06/2018 64.6  43.0 - 77.0 % Final  . Lymphocytes Relative 06/06/2018 21.4  12.0 - 46.0 % Final  . Monocytes Relative 06/06/2018 10.1  3.0 - 12.0 % Final  . Eosinophils Relative 06/06/2018 2.9  0.0 - 5.0 % Final  . Basophils Relative 06/06/2018 1.0  0.0 - 3.0 % Final  . Neutro Abs 06/06/2018 2.2  1.4 - 7.7 K/uL Final  . Lymphs Abs 06/06/2018 0.7  0.7 - 4.0 K/uL Final  . Monocytes Absolute 06/06/2018 0.3  0.1 - 1.0 K/uL Final  . Eosinophils Absolute 06/06/2018 0.1  0.0 - 0.7 K/uL Final  .  Basophils Absolute 06/06/2018 0.0  0.0 - 0.1 K/uL Final  . D-Dimer, Quant 06/06/2018 0.55* <0.50 mcg/mL FEU Final   Comment: . The D-Dimer test is used frequently to exclude an acute PE or DVT. In patients with a low to moderate clinical risk assessment and a D-Dimer result <0.50 mcg/mL FEU, the likelihood of a PE or DVT is very low. However, a thromboembolic event should not be excluded solely on the basis of the D-Dimer level. Increased levels of D-Dimer are associated with a PE, DVT, DIC, malignancies, inflammation, sepsis, surgery, trauma, pregnancy, and advancing patient age. [Jama 2006 11:295(2):199-207] . For additional information, please refer to: http://education.questdiagnostics.com/faq/FAQ149 (This link is being provided for informational/ educational purposes only) .     Allergies as of 06/17/2018   No Known Allergies     Medication List        Accurate as of 06/16/18  9:47 PM. Always use your most recent med list.          acetaminophen 500 MG tablet Commonly known as:  TYLENOL Take 1,000 mg by mouth daily as needed for moderate pain.   amLODipine 5 MG tablet Commonly known as:  NORVASC Take 1 tablet (5 mg total) by mouth daily.   aspirin EC 81 MG tablet Take 1 tablet (81 mg total) by mouth daily.   BD PEN NEEDLE NANO U/F 32G X 4 MM Misc Generic drug:  Insulin Pen Needle USE AS DIRECTED FOR TRESIBA INJECTIONS ONCE DAILY   clopidogrel 75 MG tablet Commonly known as:  PLAVIX Take 1 tablet (75 mg total) by mouth daily.   diclofenac sodium 1 % Gel Commonly known as:  VOLTAREN Apply 2 g topically 4 (four) times daily.   gabapentin 600 MG tablet Commonly known as:  NEURONTIN Take 2 tablets (1,200 mg total) by mouth 3 (three) times daily.   HYDROcodone-acetaminophen 10-325 MG tablet Commonly known as:  NORCO Take 0.5-1 tablets by mouth every 8 (eight) hours as needed for moderate pain.   insulin aspart 100 UNIT/ML injection Commonly known as:   novoLOG Inject 4 Units into the skin 3 (three) times daily as needed for high blood sugar.   methocarbamol 500 MG tablet Commonly known as:  ROBAXIN Take 1 tablet (500 mg total) by mouth 2 (two) times daily as needed for muscle spasms.   metoprolol tartrate 100 MG tablet Commonly known as:  LOPRESSOR Take 1 tablet (100 mg total) by mouth 2 (two) times daily.   montelukast 10 MG tablet Commonly known as:  SINGULAIR Take 1 tablet (10 mg total) by mouth at  bedtime.   omeprazole 20 MG capsule Commonly known as:  PRILOSEC Take 20 mg by mouth.   rosuvastatin 10 MG tablet Commonly known as:  CRESTOR Take 1 tablet (10 mg total) by mouth daily at 6 PM.   TRESIBA FLEXTOUCH 100 UNIT/ML Sopn FlexTouch Pen Generic drug:  insulin degludec INJECT 50 UNITS UNDER THE SKIN EVERY MORNING   valsartan 320 MG tablet Commonly known as:  DIOVAN TAKE 1 TABLET BY MOUTH EVERY DAY   VITAMIN B 12 PO Take 1 tablet by mouth daily.   VITAMIN D3 PO Take 1,000 Units by mouth daily.   vitamin E 1000 UNIT capsule Generic drug:  vitamin E Take 1,000 Units by mouth daily. 2500 UNITS       Allergies: No Known Allergies  Past Medical History:  Diagnosis Date  . Allergy   . Diabetes mellitus without complication ([place])   . Hyperlipidemia   . Hypertension   . Neuropathy   . Osteoarthritis     Past Surgical History:  Procedure Laterality Date  . JOINT REPLACEMENT      Family History  Problem Relation Age of Onset  . Hypertension Mother   . Diabetes Mother   . Hyperlipidemia Mother   . Hypertension Father   . Hypertension Sister   . Cancer Sister     Social History:  reports that he has quit smoking. He has never used smokeless tobacco. He reports that he drinks alcohol. He reports that he does not use drugs.   Review of Systems   Lipid history:     Lab Results  Component Value Date   CHOL 162 03/06/2018   HDL 37.60 (L) 03/06/2018   LDLCALC 105 (H) 03/06/2018   TRIG 95.0  03/06/2018   CHOLHDL 4 03/06/2018           Hypertension: Has been present  BP Readings from Last 3 Encounters:  06/06/18 128/88  05/29/18 (!) 176/94  05/07/18 (!) 168/98    Most recent eye exam was in  Most recent foot exam:  Currently known complications of diabetes:  LABS:  No visits with results within 1 Week(s) from this visit.  Latest known visit with results is:  Office Visit on 06/06/2018  Component Date Value Ref Range Status  . QuantiFERON-TB Gold Plus 06/06/2018 NEGATIVE  NEGATIVE Final   Comment: Negative test result. M. tuberculosis complex  infection unlikely.   Marland Kitchen NIL 06/06/2018 0.06  IU/mL Final  . Mitogen-NIL 06/06/2018 >10.00  IU/mL Final  . TB1-NIL 06/06/2018 <0.00  IU/mL Final  . TB2-NIL 06/06/2018 <0.00  IU/mL Final   Comment: . The Nil tube value reflects the background interferon gamma immune response of the patient's blood sample. This value has been subtracted from the patient's displayed TB and Mitogen results. . Lower than expected results with the Mitogen tube prevent false-negative Quantiferon readings by detecting a patient with a potential immune suppressive condition and/or suboptimal pre-analytical specimen handling. . The TB1 Antigen tube is coated with the M. tuberculosis-specific antigens designed to elicit responses from TB antigen primed CD4+ helper T-lymphocytes. . The TB2 Antigen tube is coated with the M. tuberculosis-specific antigens designed to elicit responses from TB antigen primed CD4+ helper and CD8+ cytotoxic T-lymphocytes. . For additional information, please refer to https://education.questdiagnostics.com/faq/FAQ204 (This link is being provided for informational/ educational purposes only.) .   Marland Kitchen WBC 06/06/2018 3.5* 4.0 - 10.5 K/uL Final  . RBC 06/06/2018 3.72* 4.22 - 5.81 Mil/uL Final  . Hemoglobin 06/06/2018 10.8* 13.0 -  17.0 g/dL Final  . HCT 06/06/2018 31.8* 39.0 - 52.0 % Final  . MCV 06/06/2018 85.7   78.0 - 100.0 fl Final  . MCHC 06/06/2018 34.0  30.0 - 36.0 g/dL Final  . RDW 06/06/2018 14.5  11.5 - 15.5 % Final  . Platelets 06/06/2018 204.0  150.0 - 400.0 K/uL Final  . Neutrophils Relative % 06/06/2018 64.6  43.0 - 77.0 % Final  . Lymphocytes Relative 06/06/2018 21.4  12.0 - 46.0 % Final  . Monocytes Relative 06/06/2018 10.1  3.0 - 12.0 % Final  . Eosinophils Relative 06/06/2018 2.9  0.0 - 5.0 % Final  . Basophils Relative 06/06/2018 1.0  0.0 - 3.0 % Final  . Neutro Abs 06/06/2018 2.2  1.4 - 7.7 K/uL Final  . Lymphs Abs 06/06/2018 0.7  0.7 - 4.0 K/uL Final  . Monocytes Absolute 06/06/2018 0.3  0.1 - 1.0 K/uL Final  . Eosinophils Absolute 06/06/2018 0.1  0.0 - 0.7 K/uL Final  . Basophils Absolute 06/06/2018 0.0  0.0 - 0.1 K/uL Final  . D-Dimer, Quant 06/06/2018 0.55* <0.50 mcg/mL FEU Final   Comment: . The D-Dimer test is used frequently to exclude an acute PE or DVT. In patients with a low to moderate clinical risk assessment and a D-Dimer result <0.50 mcg/mL FEU, the likelihood of a PE or DVT is very low. However, a thromboembolic event should not be excluded solely on the basis of the D-Dimer level. Increased levels of D-Dimer are associated with a PE, DVT, DIC, malignancies, inflammation, sepsis, surgery, trauma, pregnancy, and advancing patient age. [Jama 2006 11:295(2):199-207] . For additional information, please refer to: http://education.questdiagn[facility].com/faq/FAQ149 (This link is being provided for informational/ educational purposes only) .     Physical Examination:  There were no vitals taken for this visit.  GENERAL:         Patient has generalized obesity.    HEENT:         Eye exam shows normal external appearance.  Fundus exam shows no retinopathy.  Oral exam shows normal mucosa .   NECK:   There is no lymphadenopathy  Thyroid is not enlarged and no nodules felt.   Carotids are normal to palpation and no bruit heard  LUNGS:         Chest is  symmetrical. Lungs are clear to auscultation.Marland Kitchen   HEART:         Heart sounds:  S1 and S2 are normal. No murmur or click heard., no S3 or S4.   ABDOMEN:   There is no distention present. Liver and spleen are not palpable.  No other mass or tenderness present.    NEUROLOGICAL:   Ankle jerks are absent bilaterally.    Diabetic Foot Exam - Simple   No data filed             Vibration sense is  reduced in distal first toes.  MUSCULOSKELETAL:  There is no swelling or deformity of the peripheral joints.     EXTREMITIES:     There is no ankle edema.  SKIN:       No rash or lesions of concern.        ASSESSMENT:  Diabetes type 2,  See history of present illness for detailed discussion of current diabetes management, blood sugar patterns and problems identified    Complications of diabetes:    PLAN:       There are no Patient Instructions on file for this visit.   Consultation note has been  sent to the referring physician  Elayne Snare 06/16/2018, 9:47 PM   Note: This office note was prepared with Dragon voice recognition system technology. Any transcriptional errors that result from this process are unintentional.

## 2018-06-17 ENCOUNTER — Ambulatory Visit: Payer: Medicare HMO | Admitting: Endocrinology

## 2018-06-17 DIAGNOSIS — Z0289 Encounter for other administrative examinations: Secondary | ICD-10-CM

## 2018-07-02 ENCOUNTER — Encounter
Payer: BLUE CROSS/BLUE SHIELD | Attending: Physical Medicine & Rehabilitation | Admitting: Physical Medicine & Rehabilitation

## 2018-07-02 DIAGNOSIS — E1142 Type 2 diabetes mellitus with diabetic polyneuropathy: Secondary | ICD-10-CM | POA: Insufficient documentation

## 2018-07-02 DIAGNOSIS — Z8249 Family history of ischemic heart disease and other diseases of the circulatory system: Secondary | ICD-10-CM | POA: Insufficient documentation

## 2018-07-02 DIAGNOSIS — Z87891 Personal history of nicotine dependence: Secondary | ICD-10-CM | POA: Insufficient documentation

## 2018-07-02 DIAGNOSIS — M159 Polyosteoarthritis, unspecified: Secondary | ICD-10-CM | POA: Insufficient documentation

## 2018-07-02 DIAGNOSIS — R296 Repeated falls: Secondary | ICD-10-CM | POA: Insufficient documentation

## 2018-07-02 DIAGNOSIS — R042 Hemoptysis: Secondary | ICD-10-CM | POA: Insufficient documentation

## 2018-07-02 DIAGNOSIS — R269 Unspecified abnormalities of gait and mobility: Secondary | ICD-10-CM | POA: Insufficient documentation

## 2018-07-02 DIAGNOSIS — R531 Weakness: Secondary | ICD-10-CM | POA: Insufficient documentation

## 2018-07-02 DIAGNOSIS — F09 Unspecified mental disorder due to known physiological condition: Secondary | ICD-10-CM | POA: Insufficient documentation

## 2018-07-02 DIAGNOSIS — R200 Anesthesia of skin: Secondary | ICD-10-CM | POA: Insufficient documentation

## 2018-07-02 DIAGNOSIS — R350 Frequency of micturition: Secondary | ICD-10-CM | POA: Insufficient documentation

## 2018-07-02 DIAGNOSIS — Z833 Family history of diabetes mellitus: Secondary | ICD-10-CM | POA: Insufficient documentation

## 2018-07-02 DIAGNOSIS — G479 Sleep disorder, unspecified: Secondary | ICD-10-CM | POA: Insufficient documentation

## 2018-07-02 DIAGNOSIS — E1122 Type 2 diabetes mellitus with diabetic chronic kidney disease: Secondary | ICD-10-CM | POA: Insufficient documentation

## 2018-07-02 DIAGNOSIS — I129 Hypertensive chronic kidney disease with stage 1 through stage 4 chronic kidney disease, or unspecified chronic kidney disease: Secondary | ICD-10-CM | POA: Insufficient documentation

## 2018-07-02 DIAGNOSIS — E785 Hyperlipidemia, unspecified: Secondary | ICD-10-CM | POA: Insufficient documentation

## 2018-07-02 DIAGNOSIS — N189 Chronic kidney disease, unspecified: Secondary | ICD-10-CM | POA: Insufficient documentation

## 2018-07-02 DIAGNOSIS — Z6828 Body mass index (BMI) 28.0-28.9, adult: Secondary | ICD-10-CM | POA: Insufficient documentation
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
## 2018-07-10 ENCOUNTER — Institutional Professional Consult (permissible substitution): Payer: BLUE CROSS/BLUE SHIELD | Admitting: Pulmonary Disease

## 2018-07-18 ENCOUNTER — Telehealth: Payer: Self-pay | Admitting: Family Medicine

## 2018-07-18 NOTE — Telephone Encounter (Signed)
Copied from [place] [id number]. Topic: General - Other >> Jul 18, 2018  1:37 PM Adelene Idler wrote: [facility] is calling in for an update on 8 meds that were faxed over for a new script 07/10/2018  Cb# [id number]

## 2018-07-24 NOTE — Telephone Encounter (Signed)
Spoke with [facility] stated that they will fax a request for medication that the pt requires.

## 2018-08-05 ENCOUNTER — Encounter (HOSPITAL_COMMUNITY): Payer: Self-pay | Admitting: Emergency Medicine

## 2018-08-05 ENCOUNTER — Emergency Department (HOSPITAL_COMMUNITY)
Admission: EM | Admit: 2018-08-05 | Discharge: 2018-08-06 | Disposition: A | Discharge status: Home / Self Care | Payer: BLUE CROSS/BLUE SHIELD | Attending: Emergency Medicine | Admitting: Emergency Medicine

## 2018-08-05 DIAGNOSIS — Z79899 Other long term (current) drug therapy: Secondary | ICD-10-CM | POA: Insufficient documentation

## 2018-08-05 DIAGNOSIS — Z794 Long term (current) use of insulin: Secondary | ICD-10-CM | POA: Insufficient documentation

## 2018-08-05 DIAGNOSIS — Z7982 Long term (current) use of aspirin: Secondary | ICD-10-CM | POA: Insufficient documentation

## 2018-08-05 DIAGNOSIS — Z7902 Long term (current) use of antithrombotics/antiplatelets: Secondary | ICD-10-CM | POA: Diagnosis not present

## 2018-08-05 DIAGNOSIS — E1140 Type 2 diabetes mellitus with diabetic neuropathy, unspecified: Secondary | ICD-10-CM | POA: Diagnosis not present

## 2018-08-05 DIAGNOSIS — R609 Edema, unspecified: Secondary | ICD-10-CM | POA: Diagnosis not present

## 2018-08-05 DIAGNOSIS — R2243 Localized swelling, mass and lump, lower limb, bilateral: Secondary | ICD-10-CM | POA: Diagnosis present

## 2018-08-05 DIAGNOSIS — Z87891 Personal history of nicotine dependence: Secondary | ICD-10-CM | POA: Diagnosis not present

## 2018-08-05 DIAGNOSIS — I10 Essential (primary) hypertension: Secondary | ICD-10-CM | POA: Diagnosis not present

## 2018-08-05 LAB — COMPREHENSIVE METABOLIC PANEL
ALT: 11 U/L (ref 0–44)
AST: 14 U/L — ABNORMAL LOW (ref 15–41)
Albumin: 2.1 g/dL — ABNORMAL LOW (ref 3.5–5.0)
Alkaline Phosphatase: 100 U/L (ref 38–126)
Anion gap: 5 (ref 5–15)
BUN: 17 mg/dL (ref 8–23)
CO2: 25 mmol/L (ref 22–32)
Calcium: 8 mg/dL — ABNORMAL LOW (ref 8.9–10.3)
Chloride: 105 mmol/L (ref 98–111)
Creatinine, Ser: 2.11 mg/dL — ABNORMAL HIGH (ref 0.61–1.24)
GFR calc Af Amer: 35 mL/min — ABNORMAL LOW (ref >60)
GFR calc non Af Amer: 31 mL/min — ABNORMAL LOW (ref >60)
Glucose, Bld: 135 mg/dL — ABNORMAL HIGH (ref 70–99)
Potassium: 4.2 mmol/L (ref 3.5–5.1)
Sodium: 135 mmol/L (ref 135–145)
Total Bilirubin: 0.6 mg/dL (ref 0.3–1.2)
Total Protein: 5.4 g/dL — ABNORMAL LOW (ref 6.5–8.1)

## 2018-08-05 LAB — CBC WITH DIFFERENTIAL/PLATELET
Abs Immature Granulocytes: 0.01 10*3/uL (ref 0.00–0.07)
Basophils Absolute: 0 10*3/uL (ref 0.0–0.1)
Basophils Relative: 1 %
Eosinophils Absolute: 0.2 10*3/uL (ref 0.0–0.5)
Eosinophils Relative: 5 %
HCT: 34.2 % — ABNORMAL LOW (ref 39.0–52.0)
Hemoglobin: 10.7 g/dL — ABNORMAL LOW (ref 13.0–17.0)
Immature Granulocytes: 0 %
Lymphocytes Relative: 26 %
Lymphs Abs: 0.8 10*3/uL (ref 0.7–4.0)
MCH: 27.6 pg (ref 26.0–34.0)
MCHC: 31.3 g/dL (ref 30.0–36.0)
MCV: 88.4 fL (ref 80.0–100.0)
Monocytes Absolute: 0.3 10*3/uL (ref 0.1–1.0)
Monocytes Relative: 11 %
Neutro Abs: 1.7 10*3/uL (ref 1.7–7.7)
Neutrophils Relative %: 57 %
Platelets: 256 10*3/uL (ref 150–400)
RBC: 3.87 MIL/uL — ABNORMAL LOW (ref 4.22–5.81)
RDW: 13.2 % (ref 11.5–15.5)
WBC: 3 10*3/uL — ABNORMAL LOW (ref 4.0–10.5)
nRBC: 0 % (ref 0.0–0.2)

## 2018-08-05 MED ORDER — FUROSEMIDE 10 MG/ML IJ SOLN
40.0000 mg | Freq: Once | INTRAMUSCULAR | Status: AC
  Administered 2018-08-06: 40 mg via INTRAVENOUS
  Filled 2018-08-05: qty 4

## 2018-08-05 MED ORDER — HYDRALAZINE HCL 20 MG/ML IJ SOLN
5.0000 mg | Freq: Once | INTRAMUSCULAR | Status: AC
  Administered 2018-08-06: 5 mg via INTRAVENOUS
  Filled 2018-08-05: qty 1

## 2018-08-05 MED ORDER — AMLODIPINE BESYLATE 5 MG PO TABS
10.0000 mg | ORAL_TABLET | Freq: Once | ORAL | Status: AC
  Administered 2018-08-06: 10 mg via ORAL
  Filled 2018-08-05: qty 2

## 2018-08-05 NOTE — ED Provider Notes (Signed)
TIME SEEN: 11:26 PM  CHIEF COMPLAINT: Lower extremity swelling  HPI: Patient is a 67 year old male with history of hypertension, diabetes, hyperlipidemia, chronic kidney disease who presents to the emergency department with bilateral lower extremity swelling for the past 2 weeks.  States that his brother recently visited him and recommended he come to the ER.  He denies chest pain, chest discomfort, shortness of breath.  States that his blood pressure normally is elevated and in the 950D to 326Z systolic.  He is on amlodipine 5 mg daily, valsartan 320 mg daily and metoprolol 100 mg twice daily.  Reports compliance with his medications.  Recently moved here from [place].  ROS: See HPI Constitutional: no fever  Eyes: no drainage  ENT: no runny nose   Cardiovascular:  no chest pain  Resp: no SOB  GI: no vomiting GU: no dysuria Integumentary: no rash  Allergy: no hives  Musculoskeletal:  leg swelling  Neurological: no slurred speech ROS otherwise negative  PAST MEDICAL HISTORY/PAST SURGICAL HISTORY:  Past Medical History:  Diagnosis Date  . Allergy   . Diabetes mellitus without complication ([place])   . Hyperlipidemia   . Hypertension   . Neuropathy   . Osteoarthritis     MEDICATIONS:  Prior to Admission medications   Medication Sig Start Date End Date Taking? Authorizing Provider  acetaminophen (TYLENOL) 500 MG tablet Take 1,000 mg by mouth daily as needed for moderate pain.    [provider]  amLODipine (NORVASC) 5 MG tablet Take 1 tablet (5 mg total) by mouth daily. 03/06/18   Billie Ruddy, MD  aspirin EC 81 MG tablet Take 1 tablet (81 mg total) by mouth daily. 03/06/18   Billie Ruddy, MD  BD PEN NEEDLE NANO U/F 32G X 4 MM MISC USE AS DIRECTED FOR TRESIBA INJECTIONS ONCE DAILY 05/28/18   Nafziger, Tommi Rumps, NP  Cholecalciferol (VITAMIN D3 PO) Take 1,000 Units by mouth daily.    [provider]  clopidogrel (PLAVIX) 75 MG tablet Take 1 tablet (75 mg total) by  mouth daily. 03/06/18   Billie Ruddy, MD  Cyanocobalamin (VITAMIN B 12 PO) Take 1 tablet by mouth daily.    [provider]  diclofenac sodium (VOLTAREN) 1 % GEL Apply 2 g topically 4 (four) times daily. 05/29/18   Jamse Arn, MD  gabapentin (NEURONTIN) 600 MG tablet Take 2 tablets (1,200 mg total) by mouth 3 (three) times daily. 05/29/18 06/28/18  Jamse Arn, MD  HYDROcodone-acetaminophen (NORCO) 10-325 MG tablet Take 0.5-1 tablets by mouth every 8 (eight) hours as needed for moderate pain.    [provider]  insulin aspart (NOVOLOG) 100 UNIT/ML injection Inject 4 Units into the skin 3 (three) times daily as needed for high blood sugar.    [provider]  methocarbamol (ROBAXIN) 500 MG tablet Take 1 tablet (500 mg total) by mouth 2 (two) times daily as needed for muscle spasms. 05/29/18   Jamse Arn, MD  metoprolol tartrate (LOPRESSOR) 100 MG tablet Take 1 tablet (100 mg total) by mouth 2 (two) times daily. 03/06/18   Billie Ruddy, MD  montelukast (SINGULAIR) 10 MG tablet Take 1 tablet (10 mg total) by mouth at bedtime. 03/06/18   Billie Ruddy, MD  omeprazole (PRILOSEC) 20 MG capsule Take 20 mg by mouth.    [provider]  rosuvastatin (CRESTOR) 10 MG tablet Take 1 tablet (10 mg total) by mouth daily at 6 PM. 03/06/18   Billie Ruddy,  MD  TRESIBA FLEXTOUCH 100 UNIT/ML SOPN FlexTouch Pen INJECT 50 UNITS UNDER THE SKIN EVERY MORNING Patient taking differently: Inject 50 Units into the skin every morning.  04/25/18   Billie Ruddy, MD  valsartan (DIOVAN) 320 MG tablet TAKE 1 TABLET BY MOUTH EVERY DAY 04/25/18   Billie Ruddy, MD  vitamin E (VITAMIN E) 1000 UNIT capsule Take 1,000 Units by mouth daily. 2500 UNITS     [provider]    ALLERGIES:  No Known Allergies  SOCIAL HISTORY:  Social History   Tobacco Use  . Smoking status: Former Research scientist (life sciences)  . Smokeless tobacco: Never Used  Substance Use Topics  . Alcohol use: Yes     FAMILY HISTORY: Family History  Problem Relation Age of Onset  . Hypertension Mother   . Diabetes Mother   . Hyperlipidemia Mother   . Hypertension Father   . Hypertension Sister   . Cancer Sister     EXAM: BP (!) 233/118   Pulse 68   Temp 98.6 F (37 C) (Oral)   Resp 15   SpO2 98%  CONSTITUTIONAL: Alert and oriented and responds appropriately to questions. Well-appearing; well-nourished HEAD: Normocephalic EYES: Conjunctivae clear, pupils appear equal, EOMI ENT: normal nose; moist mucous membranes NECK: Supple, no meningismus, no nuchal rigidity, no LAD  CARD: RRR; S1 and S2 appreciated; no murmurs, no clicks, no rubs, no gallops RESP: Normal chest excursion without splinting or tachypnea; breath sounds clear and equal bilaterally; no wheezes, no rhonchi, no rales, no hypoxia or respiratory distress, speaking full sentences ABD/GI: Normal bowel sounds; non-distended; soft, non-tender, no rebound, no guarding, no peritoneal signs, no hepatosplenomegaly BACK:  The back appears normal and is non-tender to palpation, there is no CVA tenderness EXT: Normal ROM in all joints; non-tender to palpation; pitting edema to the legs to the level of the knee bilaterally without redness or warmth, compartments are soft, 2+ DP pulses bilaterally; normal capillary refill; no cyanosis, no calf tenderness or swelling    SKIN: Normal color for age and race; warm; no rash NEURO: Moves all extremities equally PSYCH: The patient's mood and manner are appropriate. Grooming and personal hygiene are appropriate.  MEDICAL DECISION MAKING: Patient here with peripheral edema.  No other sign of volume overload.  No chest pain or shortness of breath.  Lungs are clear no hypoxia.  He is noted to be extremely hypertensive here which may be what is causing his worsening peripheral edema.  Will work on blood pressure control and give Lasix for diuresis.  His labs show chronic kidney disease which appears to be  his baseline.  EKG shows lateral T wave changes with no old for comparison.  Will check troponin to ensure there has been no cardiac event although less likely given no chest pain or chest discomfort.  ED PROGRESS: Patient's troponin is negative.  His blood pressure has improved and he states he is feeling better and diuresing in the emergency department.  Will discharge with prescription of Lasix to take for the next week and increase his amlodipine to 10 mg daily.  Have recommended he follow-up closely with his primary care physician.  He is comfortable with this plan.  Recommended compression stockings, rest, elevation of his legs.  He verbalized understanding.  We did discuss return precautions.   At this time, I do not feel there is any life-threatening condition present. I have reviewed and discussed all results (EKG, imaging, lab, urine as appropriate) and exam findings with patient/family.  I have reviewed nursing notes and appropriate previous records.  I feel the patient is safe to be discharged home without further emergent workup and can continue workup as an outpatient as needed. Discussed usual and customary return precautions. Patient/family verbalize understanding and are comfortable with this plan.  Outpatient follow-up has been provided if needed. All questions have been answered.      EKG Interpretation  Date/Time:  Tuesday August 05 2018 21:29:34 EDT Ventricular Rate:  67 PR Interval:  178 QRS Duration: 94 QT Interval:  434 QTC Calculation: 458 R Axis:   9 Text Interpretation:  Normal sinus rhythm Left ventricular hypertrophy T wave abnormality, consider lateral ischemia Abnormal ECG No old tracing to compare Confirmed by Ward, Cyril Mourning [phone]) on 08/05/2018 11:03:49 PM         Ward, Delice Bison, DO 08/06/18 6122

## 2018-08-05 NOTE — ED Triage Notes (Signed)
Patient reports increasing swelling/edema at thighs/lower legs onset last week , denies SOB , pt. is hypertensive at triage 233/120 , no fever or chills .

## 2018-08-06 DIAGNOSIS — I10 Essential (primary) hypertension: Secondary | ICD-10-CM | POA: Diagnosis not present

## 2018-08-06 LAB — I-STAT TROPONIN, ED: Troponin i, poc: 0.06 ng/mL (ref 0.00–0.08)

## 2018-08-06 MED ORDER — FUROSEMIDE 20 MG PO TABS: 20.0000 mg | ORAL_TABLET | Freq: Every day | ORAL | 0 refills | Status: DC

## 2018-08-06 MED ORDER — AMLODIPINE BESYLATE 10 MG PO TABS: 10.0000 mg | ORAL_TABLET | Freq: Every day | ORAL | 0 refills | Status: DC

## 2018-08-06 NOTE — Discharge Instructions (Addendum)
I recommend that you increase your amlodipine to 10 mg once a day.  Please continue your Diovan and metoprolol as instructed.  We have given you a prescription of Lasix to take for the next week to help with your lower extremity swelling.  I recommend close follow-up with your primary care physician in 1 week.  I recommend that you keep your legs elevated when at rest and you may use compression hose to help with swelling.

## 2018-08-07 ENCOUNTER — Other Ambulatory Visit: Payer: Self-pay | Admitting: Family Medicine

## 2018-08-19 ENCOUNTER — Other Ambulatory Visit: Payer: Self-pay | Admitting: Physical Medicine & Rehabilitation

## 2018-08-19 ENCOUNTER — Other Ambulatory Visit: Payer: Self-pay

## 2018-08-19 MED ORDER — FUROSEMIDE 20 MG PO TABS: 20.0000 mg | ORAL_TABLET | Freq: Every day | ORAL | 0 refills | Status: DC

## 2018-08-19 MED ORDER — INSULIN ASPART 100 UNIT/ML ~~LOC~~ SOLN: 4.0000 [IU] | Freq: Two times a day (BID) | SUBCUTANEOUS | 2 refills | Status: DC

## 2018-08-20 ENCOUNTER — Ambulatory Visit (INDEPENDENT_AMBULATORY_CARE_PROVIDER_SITE_OTHER): Payer: BLUE CROSS/BLUE SHIELD | Admitting: Family Medicine

## 2018-08-20 ENCOUNTER — Encounter: Payer: Self-pay | Admitting: Family Medicine

## 2018-08-20 VITALS — BP 160/84 | HR 66 | Temp 98.4°F | Wt 216.0 lb

## 2018-08-20 DIAGNOSIS — N183 Chronic kidney disease, stage 3 unspecified: Secondary | ICD-10-CM

## 2018-08-20 DIAGNOSIS — R413 Other amnesia: Secondary | ICD-10-CM | POA: Diagnosis not present

## 2018-08-20 DIAGNOSIS — E1122 Type 2 diabetes mellitus with diabetic chronic kidney disease: Secondary | ICD-10-CM | POA: Diagnosis not present

## 2018-08-20 DIAGNOSIS — Z794 Long term (current) use of insulin: Secondary | ICD-10-CM

## 2018-08-20 DIAGNOSIS — I10 Essential (primary) hypertension: Secondary | ICD-10-CM | POA: Diagnosis not present
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
## 2018-08-20 NOTE — Patient Instructions (Addendum)
The Lasix 20 mg that you were given should only be taken as needed if you notice increased swelling in your legs.  You should not take this daily as it can cause worsening kidney function.  An order will be placed to see if home health can come to your house a few days per week to help you with medications.  You may receive a call about this.  Managing Your Hypertension Hypertension is commonly called high blood pressure. This is when the force of your blood pressing against the walls of your arteries is too strong. Arteries are blood vessels that carry blood from your heart throughout your body. Hypertension forces the heart to work harder to pump blood, and may cause the arteries to become narrow or stiff. Having untreated or uncontrolled hypertension can cause heart attack, stroke, kidney disease, and other problems. What are blood pressure readings? A blood pressure reading consists of a higher number over a lower number. Ideally, your blood pressure should be below 120/80. The first ("top") number is called the systolic pressure. It is a measure of the pressure in your arteries as your heart beats. The second ("bottom") number is called the diastolic pressure. It is a measure of the pressure in your arteries as the heart relaxes. What does my blood pressure reading mean? Blood pressure is classified into four stages. Based on your blood pressure reading, your health care provider may use the following stages to determine what type of treatment you need, if any. Systolic pressure and diastolic pressure are measured in a unit called mm Hg. Normal  Systolic pressure: below 324.  Diastolic pressure: below 80. Elevated  Systolic pressure: 401-027.  Diastolic pressure: below 80. Hypertension stage 1  Systolic pressure: 253-664.  Diastolic pressure: 40-34. Hypertension stage 2  Systolic pressure: 742 or above.  Diastolic pressure: 90 or above. What health risks are associated with  hypertension? Managing your hypertension is an important responsibility. Uncontrolled hypertension can lead to:  A heart attack.  A stroke.  A weakened blood vessel (aneurysm).  Heart failure.  Kidney damage.  Eye damage.  Metabolic syndrome.  Memory and concentration problems.  What changes can I make to manage my hypertension? Hypertension can be managed by making lifestyle changes and possibly by taking medicines. Your health care provider will help you make a plan to bring your blood pressure within a normal range. Eating and drinking  Eat a diet that is high in fiber and potassium, and low in salt (sodium), added sugar, and fat. An example eating plan is called the DASH (Dietary Approaches to Stop Hypertension) diet. To eat this way: ? Eat plenty of fresh fruits and vegetables. Try to fill half of your plate at each meal with fruits and vegetables. ? Eat whole grains, such as whole wheat pasta, brown rice, or whole grain bread. Fill about one quarter of your plate with whole grains. ? Eat low-fat diary products. ? Avoid fatty cuts of meat, processed or cured meats, and poultry with skin. Fill about one quarter of your plate with lean proteins such as fish, chicken without skin, beans, eggs, and tofu. ? Avoid premade and processed foods. These tend to be higher in sodium, added sugar, and fat.  Reduce your daily sodium intake. Most people with hypertension should eat less than 1,500 mg of sodium a day.  Limit alcohol intake to no more than 1 drink a day for nonpregnant women and 2 drinks a day for men. One drink equals 12  oz of beer, 5 oz of wine, or 1 oz of hard liquor. Lifestyle  Work with your health care provider to maintain a healthy body weight, or to lose weight. Ask what an ideal weight is for you.  Get at least 30 minutes of exercise that causes your heart to beat faster (aerobic exercise) most days of the week. Activities may include walking, swimming, or  biking.  Include exercise to strengthen your muscles (resistance exercise), such as weight lifting, as part of your weekly exercise routine. Try to do these types of exercises for 30 minutes at least 3 days a week.  Do not use any products that contain nicotine or tobacco, such as cigarettes and e-cigarettes. If you need help quitting, ask your health care provider.  Control any long-term (chronic) conditions you have, such as high cholesterol or diabetes. Monitoring  Monitor your blood pressure at home as told by your health care provider. Your personal target blood pressure may vary depending on your medical conditions, your age, and other factors.  Have your blood pressure checked regularly, as often as told by your health care provider. Working with your health care provider  Review all the medicines you take with your health care provider because there may be side effects or interactions.  Talk with your health care provider about your diet, exercise habits, and other lifestyle factors that may be contributing to hypertension.  Visit your health care provider regularly. Your health care provider can help you create and adjust your plan for managing hypertension. Will I need medicine to control my blood pressure? Your health care provider may prescribe medicine if lifestyle changes are not enough to get your blood pressure under control, and if:  Your systolic blood pressure is 130 or higher.  Your diastolic blood pressure is 80 or higher.  Take medicines only as told by your health care provider. Follow the directions carefully. Blood pressure medicines must be taken as prescribed. The medicine does not work as well when you skip doses. Skipping doses also puts you at risk for problems. Contact a health care provider if:  You think you are having a reaction to medicines you have taken.  You have repeated (recurrent) headaches.  You feel dizzy.  You have swelling in your  ankles.  You have trouble with your vision. Get help right away if:  You develop a severe headache or confusion.  You have unusual weakness or numbness, or you feel faint.  You have severe pain in your chest or abdomen.  You vomit repeatedly.  You have trouble breathing. Summary  Hypertension is when the force of blood pumping through your arteries is too strong. If this condition is not controlled, it may put you at risk for serious complications.  Your personal target blood pressure may vary depending on your medical conditions, your age, and other factors. For most people, a normal blood pressure is less than 120/80.  Hypertension is managed by lifestyle changes, medicines, or both. Lifestyle changes include weight loss, eating a healthy, low-sodium diet, exercising more, and limiting alcohol. This information is not intended to replace advice given to you by your health care provider. Make sure you discuss any questions you have with your health care provider. Document Released: 07/09/2012 Document Revised: 09/12/2016 Document Reviewed: 09/12/2016 Elsevier Interactive Patient Education  2018 [employer].  Chronic Kidney Disease, Adult Chronic kidney disease (CKD) happens when the kidneys are damaged during a time of 3 or more months. The kidneys are two  organs that do many important jobs in the body. These jobs include:  Removing wastes and extra fluids from the blood.  Making hormones that maintain the amount of fluid in your tissues and blood vessels.  Making sure that the body has the right amount of fluids and chemicals.  Most of the time, this condition does not go away, but it can usually be controlled. Steps must be taken to slow down the kidney damage or stop it from getting worse. Otherwise, the kidneys may stop working. Follow these instructions at home:  Follow your diet as told by your doctor. You may need to avoid alcohol, salty foods (sodium), and foods that  are high in potassium, calcium, and protein.  Take over-the-counter and prescription medicines only as told by your doctor. Do not take any new medicines unless your doctor says you can do that. These include vitamins and minerals. ? Medicines and nutritional supplements can make kidney damage worse. ? Your doctor may need to change how much medicine you take.  Do not use any tobacco products. These include cigarettes, chewing tobacco, and e-cigarettes. If you need help quitting, ask your doctor.  Keep all follow-up visits as told by your doctor. This is important.  Check your blood pressure. Tell your doctor if there are changes to your blood pressure.  Get to a healthy weight. Stay at that weight. If you need help with this, ask your doctor.  Start or continue an exercise plan. Try to exercise at least 30 minutes a day, 5 days a week.  Stay up-to-date with your shots (immunizations) as told by your doctor. Contact a doctor if:  Your symptoms get worse.  You have new symptoms. Get help right away if:  You have symptoms of end-stage kidney disease. These include: ? Headaches. ? Skin that is darker or lighter than normal. ? Numbness in your hands or feet. ? Easy bruising. ? Having hiccups often. ? Chest pain. ? Shortness of breath. ? Stopping of menstrual periods in women.  You have a fever.  You are making very little pee (urine).  You have pain or bleeding when you pee (urinate). This information is not intended to replace advice given to you by your health care provider. Make sure you discuss any questions you have with your health care provider. Document Released: 01/09/2010 Document Revised: 03/22/2016 Document Reviewed: 06/13/2012 Elsevier Interactive Patient Education  2017 [employer].

## 2018-08-21 LAB — HEMOGLOBIN A1C: Hgb A1c MFr Bld: 7.1 % — ABNORMAL HIGH (ref 4.6–6.5)

## 2018-08-21 LAB — BASIC METABOLIC PANEL
BUN: 24 mg/dL — ABNORMAL HIGH (ref 6–23)
CO2: 28 mEq/L (ref 19–32)
Calcium: 8 mg/dL — ABNORMAL LOW (ref 8.4–10.5)
Chloride: 104 mEq/L (ref 96–112)
Creatinine, Ser: 2.3 mg/dL — ABNORMAL HIGH (ref 0.40–1.50)
GFR: 36.47 mL/min — ABNORMAL LOW (ref >60.00)
Glucose, Bld: 58 mg/dL — ABNORMAL LOW (ref 70–99)
Potassium: 5.2 mEq/L — ABNORMAL HIGH (ref 3.5–5.1)
Sodium: 139 mEq/L (ref 135–145)

## 2018-08-26 ENCOUNTER — Telehealth: Payer: Self-pay | Admitting: Psychology

## 2018-08-26 NOTE — Telephone Encounter (Signed)
Dr Bailar is leaving our office to take a job closer to home. We have mailed letter to the patient letting them know we have canceled all appointments with Bailar. We thank you for your understanding  °

## 2018-08-27 ENCOUNTER — Telehealth: Payer: Self-pay | Admitting: Family Medicine

## 2018-08-27 NOTE — Telephone Encounter (Signed)
Copied from [place] [id number]. Topic: Quick Communication - Home Health Verbal Orders >> Aug 27, 2018  2:39 PM Bea Graff, NT wrote: Caller/Agency: [facility] Number: [phone] Requesting OT/PT/Skilled Nursing/Social Work: PT Frequency: 1 time a week for 1 weeks and 2 times a week for 3 weeks

## 2018-08-27 NOTE — Telephone Encounter (Signed)
Called and left a detailed message for Tillie Rung with [facility] regarding pt verbal orders that have been approved by Dr Volanda Napoleon .

## 2018-08-29 ENCOUNTER — Telehealth: Payer: Self-pay | Admitting: Family Medicine

## 2018-08-29 NOTE — Telephone Encounter (Signed)
ok 

## 2018-08-29 NOTE — Telephone Encounter (Signed)
Copied from [place] [phone]. Topic: Quick Communication - Home Health Verbal Orders >> Aug 29, 2018  3:41 PM Luciana Axe wrote: Caller/Agency: [name] Number: [phone] Requesting OT/PT/Skilled Nursing/Social Work: Patient Missed routine schedule visit. He had a lot to get done and he did not have time for the visit. Frequency:

## 2018-09-02 NOTE — Telephone Encounter (Signed)
Spoke with [place] with [facility], gave verbal orders per Dr Volanda Napoleon as requested.

## 2018-09-05 ENCOUNTER — Other Ambulatory Visit: Payer: Self-pay

## 2018-09-05 ENCOUNTER — Other Ambulatory Visit: Payer: Self-pay | Admitting: Family Medicine

## 2018-09-05 DIAGNOSIS — N289 Disorder of kidney and ureter, unspecified: Secondary | ICD-10-CM

## 2018-09-05 MED ORDER — AMLODIPINE BESYLATE 10 MG PO TABS: 10.0000 mg | ORAL_TABLET | Freq: Every day | ORAL | 1 refills | Status: DC

## 2018-09-12 ENCOUNTER — Telehealth: Payer: Self-pay | Admitting: Family Medicine

## 2018-09-12 NOTE — Telephone Encounter (Signed)
Copied from [place] [phone]. Topic: Quick Communication - See Telephone Encounter >> Sep 12, 2018  8:47 AM Reyne Dumas L wrote: CRM for notification. See Telephone encounter for: 09/12/18.  Elmyra Ricks from [facility] calling in.  States that pt is refusing PT for now.  States he needs further clarification about role of PT and long term opioids. Elmyra Ricks can be reached at [phone]

## 2018-09-15 NOTE — Telephone Encounter (Signed)
Please Advise

## 2018-09-16 NOTE — Telephone Encounter (Signed)
Noted.  Pt can refuse PT if he wants.

## 2018-09-17 NOTE — Telephone Encounter (Signed)
Spoke with William Webb with [facility] regarding pt refusal for therapy, Voiced understanding that Dr Volanda Napoleon is aware and that pt has the right if he doesn't want to continue with therapy

## 2018-09-19 ENCOUNTER — Ambulatory Visit (INDEPENDENT_AMBULATORY_CARE_PROVIDER_SITE_OTHER): Payer: BLUE CROSS/BLUE SHIELD | Admitting: Family Medicine

## 2018-09-19 VITALS — BP 180/98 | HR 68 | Temp 97.8°F | Wt 216.0 lb

## 2018-09-19 DIAGNOSIS — Z794 Long term (current) use of insulin: Secondary | ICD-10-CM

## 2018-09-19 DIAGNOSIS — N183 Chronic kidney disease, stage 3 unspecified: Secondary | ICD-10-CM

## 2018-09-19 DIAGNOSIS — I10 Essential (primary) hypertension: Secondary | ICD-10-CM | POA: Diagnosis not present

## 2018-09-19 DIAGNOSIS — E1122 Type 2 diabetes mellitus with diabetic chronic kidney disease: Secondary | ICD-10-CM | POA: Diagnosis not present

## 2018-09-19 NOTE — Progress Notes (Signed)
Subjective:    Patient ID: William Webb [facility], male    DOB: 12-Jun-1951, 67 y.o.   MRN: [id number]  No chief complaint on file.   HPI Patient was seen today for f/u  DM II: -taking novolog 4 units TID and Tresiba 45 units  -pt expressed concerns about novolog 2/2 cost -in the past pt was on humalog, but thought it contributed to ending up in the hospital. -last hgb A1C 7.1% -pt notes he has not eaten much today.  States has been taking his grandson to appts and needs to pick him up after this appt.  HTN: -pt did not take his meds this am -on Norvasc 10 mg and valsartan 320 mg -concerned norvasc is causing erectile dysfunction -H/H sent to pt's home for med management -pt has been refusing their services lately as he did not feel he needed "therpay"  Pt endorses continued stress.  States he grandson is out of the hospital s/p being hit by a motor vehicle.  Pt states he left town but came back for f/u appts.  Pt is concerned pt will not get his life together.  Past Medical History:  Diagnosis Date  . Allergy   . Diabetes mellitus without complication ([place])   . Hyperlipidemia   . Hypertension   . Neuropathy   . Osteoarthritis     No Known Allergies  ROS General: Denies fever, chills, night sweats, changes in weight, changes in appetite HEENT: Denies headaches, ear pain, changes in vision, rhinorrhea, sore throat CV: Denies CP, palpitations, SOB, orthopnea Pulm: Denies SOB, cough, wheezing GI: Denies abdominal pain, nausea, vomiting, diarrhea, constipation GU: Denies dysuria, hematuria, frequency Msk: Denies muscle cramps, joint pains Neuro: Denies weakness, numbness, tingling Skin: Denies rashes, bruising Psych: Denies depression, anxiety, hallucinations    Objective:    Blood pressure (!) 180/98, pulse 68, temperature 97.8 F (36.6 C), temperature source Oral, weight 216 lb (98 kg), SpO2 94 %.  Gen. Pleasant, well-nourished, in no distress, normal affect    HEENT: [place]/AT, face symmetric, no scleral icterus, PERRLA, nares patent without drainage Neck: No JVD, no thyromegaly, no carotid bruits Lungs: no accessory muscle use, CTAB, no wheezes or rales Cardiovascular: RRR, no m/r/g, no peripheral edema Neuro:  A&Ox3, CN II-XII intact, normal gait  Wt Readings from Last 3 Encounters:  08/20/18 216 lb (98 kg)  06/06/18 222 lb (100.7 kg)  05/29/18 224 lb 9.6 oz (101.9 kg)    Lab Results  Component Value Date   WBC 3.0 (L) 08/05/2018   HGB 10.7 (L) 08/05/2018   HCT 34.2 (L) 08/05/2018   PLT 256 08/05/2018   GLUCOSE 58 (L) 08/20/2018   CHOL 162 03/06/2018   TRIG 95.0 03/06/2018   HDL 37.60 (L) 03/06/2018   LDLCALC 105 (H) 03/06/2018   ALT 11 08/05/2018   AST 14 (L) 08/05/2018   NA 139 08/20/2018   K 5.2 (H) 08/20/2018   CL 104 08/20/2018   CREATININE 2.30 (H) 08/20/2018   BUN 24 (H) 08/20/2018   CO2 28 08/20/2018   INR 1.07 11/19/2017   HGBA1C 7.1 (H) 08/20/2018    Assessment/Plan:  Type 2 diabetes mellitus with stage 3 chronic kidney disease, with long-term current use of insulin ([place]) -discussed the importance of eating regularly and taking insulin with food. -encouraged to let H/H help him with meds.  May be and issue given pt's memory issues (has appt with neuropsych in Jan) -lifestyle modifications encouraged. -continue tresiba and novolog 4 units TID -discussed looking  for coupons/savings programs to help offset the cost of insulin.  Essential hypertension -uncontrolled -pt advised to take his medicine when he gets home. -discussed the importance of bp control. -concerned pt is forgetting to take his meds as previously controlled.  F/u in 1 month  [name], MD

## 2018-09-22 ENCOUNTER — Encounter: Payer: Self-pay | Admitting: Family Medicine

## 2018-09-29 ENCOUNTER — Other Ambulatory Visit: Payer: Self-pay | Admitting: Family Medicine

## 2018-10-16 ENCOUNTER — Inpatient Hospital Stay (HOSPITAL_COMMUNITY)
Admission: EM | Admit: 2018-10-16 | Discharge: 2018-10-21 | DRG: 251 | Disposition: A | Discharge status: Home / Self Care | Payer: BLUE CROSS/BLUE SHIELD | Attending: Internal Medicine | Admitting: Internal Medicine

## 2018-10-16 ENCOUNTER — Emergency Department (HOSPITAL_COMMUNITY): Payer: BLUE CROSS/BLUE SHIELD

## 2018-10-16 ENCOUNTER — Encounter (HOSPITAL_COMMUNITY): Payer: Self-pay | Admitting: Emergency Medicine

## 2018-10-16 ENCOUNTER — Other Ambulatory Visit: Payer: Self-pay

## 2018-10-16 DIAGNOSIS — Z9114 Patient's other noncompliance with medication regimen: Secondary | ICD-10-CM

## 2018-10-16 DIAGNOSIS — I169 Hypertensive crisis, unspecified: Secondary | ICD-10-CM | POA: Diagnosis not present

## 2018-10-16 DIAGNOSIS — E1142 Type 2 diabetes mellitus with diabetic polyneuropathy: Secondary | ICD-10-CM | POA: Diagnosis present

## 2018-10-16 DIAGNOSIS — Z7982 Long term (current) use of aspirin: Secondary | ICD-10-CM

## 2018-10-16 DIAGNOSIS — I214 Non-ST elevation (NSTEMI) myocardial infarction: Secondary | ICD-10-CM | POA: Diagnosis not present

## 2018-10-16 DIAGNOSIS — D631 Anemia in chronic kidney disease: Secondary | ICD-10-CM | POA: Diagnosis present

## 2018-10-16 DIAGNOSIS — N183 Chronic kidney disease, stage 3 unspecified: Secondary | ICD-10-CM

## 2018-10-16 DIAGNOSIS — N189 Chronic kidney disease, unspecified: Secondary | ICD-10-CM

## 2018-10-16 DIAGNOSIS — E1129 Type 2 diabetes mellitus with other diabetic kidney complication: Secondary | ICD-10-CM | POA: Insufficient documentation

## 2018-10-16 DIAGNOSIS — Z87891 Personal history of nicotine dependence: Secondary | ICD-10-CM

## 2018-10-16 DIAGNOSIS — I152 Hypertension secondary to endocrine disorders: Secondary | ICD-10-CM | POA: Insufficient documentation

## 2018-10-16 DIAGNOSIS — E119 Type 2 diabetes mellitus without complications: Secondary | ICD-10-CM

## 2018-10-16 DIAGNOSIS — Z91148 Patient's other noncompliance with medication regimen for other reason: Secondary | ICD-10-CM

## 2018-10-16 DIAGNOSIS — E11649 Type 2 diabetes mellitus with hypoglycemia without coma: Secondary | ICD-10-CM | POA: Diagnosis not present

## 2018-10-16 DIAGNOSIS — I160 Hypertensive urgency: Secondary | ICD-10-CM | POA: Diagnosis present

## 2018-10-16 DIAGNOSIS — I2510 Atherosclerotic heart disease of native coronary artery without angina pectoris: Secondary | ICD-10-CM | POA: Diagnosis present

## 2018-10-16 DIAGNOSIS — N1831 Chronic kidney disease, stage 3a: Secondary | ICD-10-CM | POA: Diagnosis present

## 2018-10-16 DIAGNOSIS — I129 Hypertensive chronic kidney disease with stage 1 through stage 4 chronic kidney disease, or unspecified chronic kidney disease: Secondary | ICD-10-CM | POA: Diagnosis present

## 2018-10-16 DIAGNOSIS — Z79899 Other long term (current) drug therapy: Secondary | ICD-10-CM

## 2018-10-16 DIAGNOSIS — Z7902 Long term (current) use of antithrombotics/antiplatelets: Secondary | ICD-10-CM

## 2018-10-16 DIAGNOSIS — Z794 Long term (current) use of insulin: Secondary | ICD-10-CM

## 2018-10-16 DIAGNOSIS — E785 Hyperlipidemia, unspecified: Secondary | ICD-10-CM | POA: Diagnosis present

## 2018-10-16 DIAGNOSIS — I10 Essential (primary) hypertension: Secondary | ICD-10-CM | POA: Insufficient documentation

## 2018-10-16 DIAGNOSIS — E1122 Type 2 diabetes mellitus with diabetic chronic kidney disease: Secondary | ICD-10-CM | POA: Diagnosis present

## 2018-10-16 HISTORY — DX: Chronic kidney disease, stage 3 (moderate): N18.3

## 2018-10-16 HISTORY — DX: Type 2 diabetes mellitus with diabetic polyneuropathy: E11.42

## 2018-10-16 HISTORY — DX: Hypertensive crisis, unspecified: I16.9

## 2018-10-16 HISTORY — DX: Type 2 diabetes mellitus without complications: E11.9

## 2018-10-16 HISTORY — DX: Gastro-esophageal reflux disease without esophagitis: K21.9

## 2018-10-16 HISTORY — DX: Chronic kidney disease, stage 3 unspecified: N18.30

## 2018-10-16 HISTORY — DX: Atherosclerotic heart disease of native coronary artery without angina pectoris: I25.10

## 2018-10-16 HISTORY — DX: Patient's other noncompliance with medication regimen: Z91.14

## 2018-10-16 HISTORY — DX: Anemia, unspecified: D64.9

## 2018-10-16 HISTORY — DX: Patient's other noncompliance with medication regimen for other reason: Z91.148

## 2018-10-16 HISTORY — DX: Other seasonal allergic rhinitis: J30.2

## 2018-10-16 HISTORY — DX: Pneumonia, unspecified organism: J18.9

## 2018-10-16 LAB — CBC WITH DIFFERENTIAL/PLATELET
Abs Immature Granulocytes: 0.01 10*3/uL (ref 0.00–0.07)
Basophils Absolute: 0 10*3/uL (ref 0.0–0.1)
Basophils Relative: 1 %
Eosinophils Absolute: 0.2 10*3/uL (ref 0.0–0.5)
Eosinophils Relative: 4 %
HCT: 38.9 % — ABNORMAL LOW (ref 39.0–52.0)
Hemoglobin: 12.1 g/dL — ABNORMAL LOW (ref 13.0–17.0)
Immature Granulocytes: 0 %
Lymphocytes Relative: 21 %
Lymphs Abs: 1.1 10*3/uL (ref 0.7–4.0)
MCH: 27.3 pg (ref 26.0–34.0)
MCHC: 31.1 g/dL (ref 30.0–36.0)
MCV: 87.8 fL (ref 80.0–100.0)
Monocytes Absolute: 0.4 10*3/uL (ref 0.1–1.0)
Monocytes Relative: 9 %
Neutro Abs: 3.2 10*3/uL (ref 1.7–7.7)
Neutrophils Relative %: 65 %
Platelets: 217 10*3/uL (ref 150–400)
RBC: 4.43 MIL/uL (ref 4.22–5.81)
RDW: 13.5 % (ref 11.5–15.5)
WBC: 4.9 10*3/uL (ref 4.0–10.5)
nRBC: 0 % (ref 0.0–0.2)

## 2018-10-16 LAB — I-STAT CHEM 8, ED
BUN: 21 mg/dL (ref 8–23)
Calcium, Ion: 1.13 mmol/L — ABNORMAL LOW (ref 1.15–1.40)
Chloride: 108 mmol/L (ref 98–111)
Creatinine, Ser: 2.5 mg/dL — ABNORMAL HIGH (ref 0.61–1.24)
Glucose, Bld: 168 mg/dL — ABNORMAL HIGH (ref 70–99)
HCT: 36 % — ABNORMAL LOW (ref 39.0–52.0)
Hemoglobin: 12.2 g/dL — ABNORMAL LOW (ref 13.0–17.0)
Potassium: 4.2 mmol/L (ref 3.5–5.1)
Sodium: 139 mmol/L (ref 135–145)
TCO2: 24 mmol/L (ref 22–32)

## 2018-10-16 LAB — GLUCOSE, CAPILLARY
Glucose-Capillary: 208 mg/dL — ABNORMAL HIGH (ref 70–99)
Glucose-Capillary: 125 mg/dL — ABNORMAL HIGH (ref 70–99)

## 2018-10-16 LAB — COMPREHENSIVE METABOLIC PANEL
ALT: 11 U/L (ref 0–44)
AST: 16 U/L (ref 15–41)
Albumin: 2.2 g/dL — ABNORMAL LOW (ref 3.5–5.0)
Alkaline Phosphatase: 116 U/L (ref 38–126)
Anion gap: 9 (ref 5–15)
BUN: 20 mg/dL (ref 8–23)
CO2: 22 mmol/L (ref 22–32)
Calcium: 8.2 mg/dL — ABNORMAL LOW (ref 8.9–10.3)
Chloride: 107 mmol/L (ref 98–111)
Creatinine, Ser: 2.34 mg/dL — ABNORMAL HIGH (ref 0.61–1.24)
GFR calc Af Amer: 32 mL/min — ABNORMAL LOW (ref >60)
GFR calc non Af Amer: 28 mL/min — ABNORMAL LOW (ref >60)
Glucose, Bld: 172 mg/dL — ABNORMAL HIGH (ref 70–99)
Potassium: 4.2 mmol/L (ref 3.5–5.1)
Sodium: 138 mmol/L (ref 135–145)
Total Bilirubin: 0.7 mg/dL (ref 0.3–1.2)
Total Protein: 6 g/dL — ABNORMAL LOW (ref 6.5–8.1)

## 2018-10-16 LAB — BRAIN NATRIURETIC PEPTIDE: B Natriuretic Peptide: 438.3 pg/mL — ABNORMAL HIGH (ref 0.0–100.0)

## 2018-10-16 LAB — TROPONIN I
Troponin I: 0.04 ng/mL (ref <0.03)
Troponin I: 6.69 ng/mL (ref <0.03)
Troponin I: 4.73 ng/mL (ref <0.03)

## 2018-10-16 LAB — I-STAT TROPONIN, ED: Troponin i, poc: 0.05 ng/mL (ref 0.00–0.08)

## 2018-10-16 MED ORDER — HYDRALAZINE HCL 20 MG/ML IJ SOLN: 5.0000 mg | Freq: Once | INTRAMUSCULAR | Status: DC

## 2018-10-16 MED ORDER — NITROGLYCERIN 0.4 MG SL SUBL
0.4000 mg | SUBLINGUAL_TABLET | SUBLINGUAL | Status: DC | PRN
  Administered 2018-10-16: 0.4 mg via SUBLINGUAL
  Filled 2018-10-16: qty 1

## 2018-10-16 MED ORDER — NITROGLYCERIN IN D5W 200-5 MCG/ML-% IV SOLN
0.0000 ug/min | INTRAVENOUS | Status: DC
  Administered 2018-10-16: 5 ug/min via INTRAVENOUS
  Filled 2018-10-16: qty 250

## 2018-10-16 MED ORDER — INSULIN ASPART 100 UNIT/ML ~~LOC~~ SOLN
0.0000 [IU] | Freq: Three times a day (TID) | SUBCUTANEOUS | Status: DC
  Administered 2018-10-16: 5 [IU] via SUBCUTANEOUS

## 2018-10-16 MED ORDER — AMLODIPINE BESYLATE 5 MG PO TABS
10.0000 mg | ORAL_TABLET | Freq: Once | ORAL | Status: AC
  Administered 2018-10-16: 10 mg via ORAL
  Filled 2018-10-16: qty 2

## 2018-10-16 MED ORDER — ROSUVASTATIN CALCIUM 5 MG PO TABS: 10.0000 mg | ORAL_TABLET | Freq: Every day | ORAL | Status: DC

## 2018-10-16 MED ORDER — CLOPIDOGREL BISULFATE 75 MG PO TABS
75.0000 mg | ORAL_TABLET | Freq: Every day | ORAL | Status: DC
  Administered 2018-10-17 – 2018-10-20 (×4): 75 mg via ORAL
  Filled 2018-10-16 (×4): qty 1

## 2018-10-16 MED ORDER — INSULIN DEGLUDEC 100 UNIT/ML ~~LOC~~ SOPN: 35.0000 [IU] | PEN_INJECTOR | Freq: Every day | SUBCUTANEOUS | Status: DC

## 2018-10-16 MED ORDER — MONTELUKAST SODIUM 10 MG PO TABS
10.0000 mg | ORAL_TABLET | Freq: Every day | ORAL | Status: DC
  Administered 2018-10-17 – 2018-10-21 (×5): 10 mg via ORAL
  Filled 2018-10-16 (×5): qty 1

## 2018-10-16 MED ORDER — INSULIN ASPART 100 UNIT/ML ~~LOC~~ SOLN: 0.0000 [IU] | Freq: Every day | SUBCUTANEOUS | Status: DC

## 2018-10-16 MED ORDER — SODIUM CHLORIDE 0.9 % IV SOLN
INTRAVENOUS | Status: AC
  Administered 2018-10-16 – 2018-10-17 (×2): via INTRAVENOUS

## 2018-10-16 MED ORDER — ASPIRIN EC 81 MG PO TBEC
81.0000 mg | DELAYED_RELEASE_TABLET | Freq: Every day | ORAL | Status: DC
  Administered 2018-10-17 – 2018-10-21 (×4): 81 mg via ORAL
  Filled 2018-10-16 (×4): qty 1

## 2018-10-16 MED ORDER — METHOCARBAMOL 500 MG PO TABS
500.0000 mg | ORAL_TABLET | Freq: Two times a day (BID) | ORAL | Status: DC | PRN
  Administered 2018-10-20: 12:00:00 500 mg via ORAL
  Filled 2018-10-16: qty 1

## 2018-10-16 MED ORDER — ROSUVASTATIN CALCIUM 20 MG PO TABS
20.0000 mg | ORAL_TABLET | Freq: Every day | ORAL | Status: DC
  Administered 2018-10-17 – 2018-10-20 (×4): 20 mg via ORAL
  Filled 2018-10-16 (×4): qty 1

## 2018-10-16 MED ORDER — HYDRALAZINE HCL 20 MG/ML IJ SOLN: 5.0000 mg | INTRAMUSCULAR | Status: DC | PRN

## 2018-10-16 MED ORDER — ACETAMINOPHEN 325 MG PO TABS: 650.0000 mg | ORAL_TABLET | ORAL | Status: DC | PRN

## 2018-10-16 MED ORDER — MORPHINE SULFATE (PF) 2 MG/ML IV SOLN
2.0000 mg | INTRAVENOUS | Status: DC | PRN
  Administered 2018-10-20: 2 mg via INTRAVENOUS
  Filled 2018-10-16: qty 1

## 2018-10-16 MED ORDER — ROSUVASTATIN CALCIUM 5 MG PO TABS
10.0000 mg | ORAL_TABLET | Freq: Every day | ORAL | Status: DC
  Administered 2018-10-16: 10 mg via ORAL
  Filled 2018-10-16: qty 2

## 2018-10-16 MED ORDER — GABAPENTIN 300 MG PO CAPS
1200.0000 mg | ORAL_CAPSULE | Freq: Three times a day (TID) | ORAL | Status: DC
  Administered 2018-10-16: 1200 mg via ORAL
  Filled 2018-10-16 (×3): qty 4

## 2018-10-16 MED ORDER — HEPARIN BOLUS VIA INFUSION
4000.0000 [IU] | Freq: Once | INTRAVENOUS | Status: DC
  Filled 2018-10-16: qty 4000

## 2018-10-16 MED ORDER — ALUM & MAG HYDROXIDE-SIMETH 200-200-20 MG/5ML PO SUSP
30.0000 mL | Freq: Once | ORAL | Status: AC
  Administered 2018-10-16: 30 mL via ORAL
  Filled 2018-10-16: qty 30

## 2018-10-16 MED ORDER — HEPARIN (PORCINE) 25000 UT/250ML-% IV SOLN
1150.0000 [IU]/h | INTRAVENOUS | Status: DC
  Filled 2018-10-16: qty 250

## 2018-10-16 MED ORDER — ONDANSETRON HCL 4 MG/2ML IJ SOLN: 4.0000 mg | Freq: Four times a day (QID) | INTRAMUSCULAR | Status: DC | PRN

## 2018-10-16 MED ORDER — PANTOPRAZOLE SODIUM 40 MG PO TBEC
40.0000 mg | DELAYED_RELEASE_TABLET | Freq: Every day | ORAL | Status: DC
  Administered 2018-10-17 – 2018-10-21 (×5): 40 mg via ORAL
  Filled 2018-10-16 (×6): qty 1

## 2018-10-16 MED ORDER — HEPARIN BOLUS VIA INFUSION
4000.0000 [IU] | Freq: Once | INTRAVENOUS | Status: AC
  Administered 2018-10-16: 4000 [IU] via INTRAVENOUS
  Filled 2018-10-16: qty 4000

## 2018-10-16 MED ORDER — METOPROLOL TARTRATE 25 MG PO TABS
100.0000 mg | ORAL_TABLET | Freq: Once | ORAL | Status: AC
  Administered 2018-10-16: 100 mg via ORAL
  Filled 2018-10-16: qty 4

## 2018-10-16 MED ORDER — ASPIRIN 81 MG PO CHEW: 81.0000 mg | CHEWABLE_TABLET | Freq: Once | ORAL | Status: DC

## 2018-10-16 MED ORDER — INSULIN GLARGINE 100 UNIT/ML ~~LOC~~ SOLN
35.0000 [IU] | Freq: Every day | SUBCUTANEOUS | Status: DC
  Administered 2018-10-16 – 2018-10-17 (×2): 35 [IU] via SUBCUTANEOUS
  Filled 2018-10-16 (×2): qty 0.35

## 2018-10-16 MED ORDER — HEPARIN (PORCINE) 25000 UT/250ML-% IV SOLN
1250.0000 [IU]/h | INTRAVENOUS | Status: DC
  Administered 2018-10-16 – 2018-10-18 (×4): 1150 [IU]/h via INTRAVENOUS
  Administered 2018-10-19 – 2018-10-20 (×2): 1250 [IU]/h via INTRAVENOUS
  Filled 2018-10-16 (×5): qty 250

## 2018-10-16 MED ORDER — ASPIRIN 81 MG PO CHEW
324.0000 mg | CHEWABLE_TABLET | Freq: Once | ORAL | Status: AC
  Administered 2018-10-16: 324 mg via ORAL
  Filled 2018-10-16: qty 4

## 2018-10-16 MED ORDER — AMLODIPINE BESYLATE 10 MG PO TABS
10.0000 mg | ORAL_TABLET | Freq: Every day | ORAL | Status: DC
  Administered 2018-10-17 – 2018-10-21 (×5): 10 mg via ORAL
  Filled 2018-10-16 (×5): qty 1

## 2018-10-16 MED ORDER — LIDOCAINE VISCOUS HCL 2 % MT SOLN
15.0000 mL | Freq: Once | OROMUCOSAL | Status: AC
  Administered 2018-10-16: 15 mL via ORAL
  Filled 2018-10-16: qty 15

## 2018-10-16 MED ORDER — METOPROLOL TARTRATE 25 MG PO TABS
100.0000 mg | ORAL_TABLET | Freq: Two times a day (BID) | ORAL | Status: DC
  Administered 2018-10-16 – 2018-10-21 (×10): 100 mg via ORAL
  Filled 2018-10-16 (×2): qty 4
  Filled 2018-10-16 (×3): qty 1
  Filled 2018-10-16: qty 4
  Filled 2018-10-16 (×4): qty 1

## 2018-10-16 MED ORDER — ENOXAPARIN SODIUM 40 MG/0.4ML ~~LOC~~ SOLN: 40.0000 mg | SUBCUTANEOUS | Status: DC

## 2018-10-16 NOTE — Progress Notes (Signed)
ANTICOAGULATION CONSULT NOTE - Initial Consult  Pharmacy Consult for heparin Indication: chest pain/ACS  No Known Allergies  Patient Measurements: Height: 6\' 3"  (190.5 cm) Weight: 216 lb (98 kg) IBW/kg (Calculated) : 84.5 Heparin Dosing Weight: 98 kg  Vital Signs: Temp: 98.2 F (36.8 C) (12/19 0952) Temp Source: Oral (12/19 0952) BP: 163/131 (12/19 1100) Pulse Rate: 82 (12/19 1100)  Labs: Recent Labs    10/16/18 0955 10/16/18 1034  HGB 12.1* 12.2*  HCT 38.9* 36.0*  PLT 217  --   CREATININE 2.34* 2.50*  TROPONINI 0.04*  --     Estimated Creatinine Clearance: 33.8 mL/min (A) (by C-G formula based on SCr of 2.5 mg/dL (H)).  Assessment: CC/HPI: 67 yo m presenting with dizziness, CP  PMH: HTN, DM  Anticoag: none pta - iv hep for r/o acs  Renal: SCr 2.5  Heme/Onc: H&H 12.2/36, Plt 217  Goal of Therapy:  Heparin level 0.3-0.7 units/ml Monitor platelets by anticoagulation protocol: Yes   Plan:  Heparin bolus 4000 units x 1 Heparin gtt 1150 units/hr 2000 HL Daily HL CBC Levester Fresh, PharmD, BCPS, BCCCP Clinical Pharmacist [phone]  Please check AMION for all [place] numbers  10/16/2018 12:39 PM

## 2018-10-16 NOTE — Progress Notes (Addendum)
ANTICOAGULATION CONSULT NOTE - Initial Consult  Pharmacy Consult for Heparin Indication: chest pain/ACS  No Known Allergies  Patient Measurements: Height: 6\' 3"  (190.5 cm) Weight: 213 lb 3.2 oz (96.7 kg) IBW/kg (Calculated) : 84.5 Heparin Dosing Weight: 98 kg  Vital Signs: Temp: 98.3 F (36.8 C) (12/19 1945) Temp Source: Oral (12/19 1721) BP: 141/79 (12/19 1945) Pulse Rate: 70 (12/19 2232)  Labs: Recent Labs    10/16/18 0955 10/16/18 1034 10/16/18 1825  HGB 12.1* 12.2*  --   HCT 38.9* 36.0*  --   PLT 217  --   --   CREATININE 2.34* 2.50*  --   TROPONINI 0.04*  --  6.69*    Estimated Creatinine Clearance: 33.8 mL/min (A) (by C-G formula based on SCr of 2.5 mg/dL (H)).  Assessment: CC/HPI: 67 yo m presenting with dizziness, CP  PMH: HTN, DM  Anticoag: none pta - iv hep for r/o acs  Renal: SCr 2.5  Heme/Onc: H&H 12.2/36, Plt 217  12/19 PM Addendum: initial heparin orders from earlier were [place] with plans for prophylactic Lovenox, now with plans to re-start full dose heparin  Goal of Therapy:  Heparin level 0.3-0.7 units/ml Monitor platelets by anticoagulation protocol: Yes   Plan:  Heparin bolus 4000 units x 1 Heparin drip at  1150 units/hr Check heparin level in 8 hours Daily HL CBC  Narda Bonds, PharmD, [place] Pharmacist Phone: [phone]

## 2018-10-16 NOTE — Consult Note (Addendum)
Reason for Consult: Troponin elevation Referring Physician: [facility] hospitalist  Sajid Ruppert [facility] is an 67 y.o. male.  HPI:   67 y/o [place] American male with uncontrolled hypertension, type 2 DM, hyperlipidemia, CKD stage III/V, presented to [facility] earlier today after an episode, as described below. Patient was at a restaurant, when he had retrosternal burning sensation-similar to what he has when he takes medications without eating. This was followed by diaphoresis and flushing sensation. Episode occurred a few more times until his arrival to the ED. On my initial interview with the patient, he did not have any chest pain. BP EKG was unremarkable, troponin was mildly elevated. We discussed the risks and benefits of cardiac catheterization. Given his renal dysfunction and likelihood of hypertensive emergency as the etiology for troponin elevation, we had decided to pursue medical management. However, since then, his trop has increased to 6.6. The patient states he "feels great" and denies any chest pain at this time.   Past Medical History:  Diagnosis Date  . Anemia   . CKD (chronic kidney disease) stage 3, GFR 30-59 ml/min (HCC)   . Diabetic peripheral neuropathy ([place])   . GERD (gastroesophageal reflux disease)   . Hyperlipidemia   . Hypertension   . Hypertensive crisis 10/16/2018  . Noncompliance with medication regimen   . Osteoarthritis    "legs, back" (10/16/2018)  . Pneumonia 2016/02/09   "real bad; I died and they had to bring me back" (10/16/2018)  . Seasonal allergies   . Type II diabetes mellitus ([place])     Past Surgical History:  Procedure Laterality Date  . CARDIAC CATHETERIZATION    . JOINT REPLACEMENT    . TOTAL KNEE ARTHROPLASTY Right     Family History  Problem Relation Age of Onset  . Hypertension Mother   . Diabetes Mother   . Hyperlipidemia Mother   . Hypertension Father   . Hypertension Sister   . Cancer Sister     Social History:   reports that he quit smoking about 34 years ago. His smoking use included cigarettes. He has a 6.60 pack-year smoking history. He has never used smokeless tobacco. He reports current alcohol use. He reports that he does not use drugs.  Allergies: No Known Allergies  Medications: I have reviewed the patient's current medications.  Results for orders placed or performed during the hospital encounter of 10/16/18 (from the past 48 hour(s))  CBC with Differential     Status: Abnormal   Collection Time: 10/16/18  9:55 AM  Result Value Ref Range   WBC 4.9 4.0 - 10.5 K/uL   RBC 4.43 4.22 - 5.81 MIL/uL   Hemoglobin 12.1 (L) 13.0 - 17.0 g/dL   HCT 38.9 (L) 39.0 - 52.0 %   MCV 87.8 80.0 - 100.0 fL   MCH 27.3 26.0 - 34.0 pg   MCHC 31.1 30.0 - 36.0 g/dL   RDW 13.5 11.5 - 15.5 %   Platelets 217 150 - 400 K/uL   nRBC 0.0 0.0 - 0.2 %   Neutrophils Relative % 65 %   Neutro Abs 3.2 1.7 - 7.7 K/uL   Lymphocytes Relative 21 %   Lymphs Abs 1.1 0.7 - 4.0 K/uL   Monocytes Relative 9 %   Monocytes Absolute 0.4 0.1 - 1.0 K/uL   Eosinophils Relative 4 %   Eosinophils Absolute 0.2 0.0 - 0.5 K/uL   Basophils Relative 1 %   Basophils Absolute 0.0 0.0 - 0.1 K/uL  Immature Granulocytes 0 %   Abs Immature Granulocytes 0.01 0.00 - 0.07 K/uL    Comment: Performed at [facility], [place] [address], [place], [place] [address]  Troponin I - ONCE - STAT     Status: Abnormal   Collection Time: 10/16/18  9:55 AM  Result Value Ref Range   Troponin I 0.04 (HH) <0.03 ng/mL    Comment: CRITICAL RESULT CALLED TO, READ BACK BY AND VERIFIED WITH: SIMMONS,N RN @ 1128 10/16/18 LEONARD,A Performed at [facility], [place] [address], [place], [place] [address]   Comprehensive metabolic panel     Status: Abnormal   Collection Time: 10/16/18  9:55 AM  Result Value Ref Range   Sodium 138 135 - 145 mmol/L   Potassium 4.2 3.5 - 5.1 mmol/L   Chloride 107 98 - 111 mmol/L   CO2 22 22 - 32 mmol/L   Glucose, Bld 172 (H)  70 - 99 mg/dL   BUN 20 8 - 23 mg/dL   Creatinine, Ser 2.34 (H) 0.61 - 1.24 mg/dL   Calcium 8.2 (L) 8.9 - 10.3 mg/dL   Total Protein 6.0 (L) 6.5 - 8.1 g/dL   Albumin 2.2 (L) 3.5 - 5.0 g/dL   AST 16 15 - 41 U/L   ALT 11 0 - 44 U/L   Alkaline Phosphatase 116 38 - 126 U/L   Total Bilirubin 0.7 0.3 - 1.2 mg/dL   GFR calc non Af Amer 28 (L) >60 mL/min   GFR calc Af Amer 32 (L) >60 mL/min   Anion gap 9 5 - 15    Comment: Performed at [facility] [address] [place], [place] [address]  Brain natriuretic peptide     Status: Abnormal   Collection Time: 10/16/18  9:55 AM  Result Value Ref Range   B Natriuretic Peptide 438.3 (H) 0.0 - 100.0 pg/mL    Comment: Performed at [facility] [address], [place], [place] [address]  I-Stat Troponin, ED (not at [facility])     Status: None   Collection Time: 10/16/18 10:33 AM  Result Value Ref Range   Troponin i, poc 0.05 0.00 - 0.08 ng/mL   Comment 3            Comment: Due to the release kinetics of cTnI, a negative result within the first hours of the onset of symptoms does not rule out myocardial infarction with certainty. If myocardial infarction is still suspected, repeat the test at appropriate intervals.   I-Stat Chem 8, ED     Status: Abnormal   Collection Time: 10/16/18 10:34 AM  Result Value Ref Range   Sodium 139 135 - 145 mmol/L   Potassium 4.2 3.5 - 5.1 mmol/L   Chloride 108 98 - 111 mmol/L   BUN 21 8 - 23 mg/dL   Creatinine, Ser 2.50 (H) 0.61 - 1.24 mg/dL   Glucose, Bld 168 (H) 70 - 99 mg/dL   Calcium, Ion 1.13 (L) 1.15 - 1.40 mmol/L   TCO2 24 22 - 32 mmol/L   Hemoglobin 12.2 (L) 13.0 - 17.0 g/dL   HCT 36.0 (L) 39.0 - 52.0 %  Glucose, capillary     Status: Abnormal   Collection Time: 10/16/18  5:22 PM  Result Value Ref Range   Glucose-Capillary 208 (H) 70 - 99 mg/dL  Troponin I - Now Then Q6H     Status: Abnormal   Collection Time: 10/16/18  6:25 PM  Result Value Ref  Range   Troponin I 6.69 (HH) <0.03 ng/mL     Comment: DELTA CHECK NOTED CRITICAL RESULT CALLED TO, READ BACK BY AND VERIFIED WITH: Truddie Hidden [id number] 10/16/2018 WBOND Performed at [facility], [place] [address], [place], [place] [address]   Glucose, capillary     Status: Abnormal   Collection Time: 10/16/18  9:52 PM  Result Value Ref Range   Glucose-Capillary 125 (H) 70 - 99 mg/dL    Dg Chest Portable 1 View  Result Date: 10/16/2018 CLINICAL DATA:  Chest pain and hypertension EXAM: PORTABLE CHEST 1 VIEW COMPARISON:  06/06/2018 FINDINGS: Mild cardiomegaly. Negative aortic and hilar contours. There is no edema, consolidation, effusion, or pneumothorax. Chronic metallic foreign body along the left posterior back (based on prior lateral view). IMPRESSION: No evidence of acute disease. Electronically Signed   By: Monte Fantasia M.D.   On: 10/16/2018 10:22    Review of Systems  Constitutional: Positive for diaphoresis (Currently resolved).  HENT: Negative.   Eyes: Negative for blurred vision.  Respiratory: Negative for shortness of breath.   Cardiovascular: Negative for chest pain.  Gastrointestinal: Positive for heartburn (Currently resolved.).  Genitourinary:       Erectile dysfunction  Musculoskeletal: Negative.   Skin: Negative.   Neurological: Negative for dizziness and loss of consciousness.  Endo/Heme/Allergies: Does not bruise/bleed easily.  Psychiatric/Behavioral: Negative.   All other systems reviewed and are negative.  Blood pressure (!) 141/79, pulse 68, temperature 98.3 F (36.8 C), resp. rate 16, height 6\' 3"  (1.905 m), weight 96.7 kg, SpO2 99 %. Physical Exam  Constitutional: He is oriented to person, place, and time. He appears well-developed and well-nourished. No distress.  HENT:  Head: Normocephalic and atraumatic.  Eyes: Pupils are equal, round, and reactive to light. Conjunctivae are normal.  Neck: Normal range of motion. Neck supple. No JVD present.  Cardiovascular: Normal rate and regular rhythm.   Murmur (II/VI crescendo murmur at RUSB) heard. Pulses:      Dorsalis pedis pulses are 0 on the right side and 0 on the left side.       Posterior tibial pulses are 1+ on the right side and 1+ on the left side.  Respiratory: Effort normal and breath sounds normal. He has no wheezes. He has no rales.  GI: Soft. Bowel sounds are normal. There is no abdominal tenderness.  Musculoskeletal:        General: No edema.  Lymphadenopathy:    He has no cervical adenopathy.  Neurological: He is alert and oriented to person, place, and time.  Psychiatric: He has a normal mood and affect.   Cardiac studies: EKG 10/16/2018: Sinus rhythm, LVH, nonspecific ST-T lateral leads.  Echocardiogram 11/20/2017: - Left ventricle: The cavity size was normal. There was moderate   concentric hypertrophy. Systolic function was normal. The   estimated ejection fraction was in the range of 60% to 65%. Wall   motion was normal; there were no regional wall motion   abnormalities. Features are consistent with a pseudonormal left   ventricular filling pattern, with concomitant abnormal relaxation   and increased filling pressure (grade 2 diastolic dysfunction). - Aortic valve: Trileaflet; mildly thickened, mildly calcified   leaflets. Transvalvular velocity was minimally increased. There   was no stenosis. - Left atrium: The atrium was moderately dilated. Volume/bsa, S:   44.8 ml/m^2. - Pericardium, extracardiac: A trivial pericardial effusion was   identified posterior to the heart.  Assessment/Recommendations:  67 y/o [place] American male with uncontrolled hypertension,  type 2 DM, hyperlipidemia, CKD stage III/V, now admitted with NSTEMI.  NSTEMI: Currently chest pain free. Trop 6.6 Recommend aspirin/heparin/crestor Blood pressure better controlled at this time. Continue metoprolol tartarate 100 mg bid and amlodipine 10 mg daily.  Continue aspirin/plavix. While I am unclear of the indication prior to this  hospital admission, recommend continuing this for medical therapy of NSTEMI.  His renal dysfunction, along with uncontrolled hypertension and type 2 DM remain a high risk for contrast induced nephropathy. I will hydrate him overnight and see nephrology consult. I will readdress the decision regarding invasive management based on renal function tomorrow morning.  Rest of the management per the primary team.   Juanitta Earnhardt J Arora Coakley 10/16/2018, 10:13 PM   Lashon Hillier Esther Hardy, MD [facility] [place] Pager: [phone] Office: [phone] If no answer Cell [phone]

## 2018-10-16 NOTE — Progress Notes (Signed)
CRITICAL VALUE ALERT  Critical Value:Troponin 6.69  Date & Time Notied: 10/16/2018 @ 1945  Provider Notified: Alger Memos, NP  Orders Received/Actions taken: NP will notify Cards.

## 2018-10-16 NOTE — ED Notes (Signed)
ED Provider at bedside updating pt on plan of care.

## 2018-10-16 NOTE — ED Provider Notes (Addendum)
[facility] EMERGENCY DEPARTMENT Provider Note   CSN: [id number] Arrival date & time: 10/16/18  0944     History   Chief Complaint Chief Complaint  Patient presents with  . Dizziness  . Hypertension    HPI William Webb is a 67 y.o. male.  HPI   67 yo M with PMHx HTN, HLD, DM here with acute diaphoresis and nausea. Pt states he was eating breakfast this AM when he experienced acute onset of chest pressure, diaphoresis, nausea, and vomiting. He felt like he wsa going to pass out. He reportedly broke out in a cold sweat and appeared very ill. The sx then resolved over 2-3 min but then returned, on and off until arrival here. Per EMS, he was noted to have HR going to 40s during episodes. Denies any current sx. No CP currently, no SOB. Denies h/o similar episodes. Of note, he has missed his Amlodipine the last several days after running out,d enies missing any other meds. No specific alleviating or aggravating factors, though sx did begin while eating.  Past Medical History:  Diagnosis Date  . Allergy   . Diabetes mellitus without complication ([place])   . Hyperlipidemia   . Hypertension   . Neuropathy   . Osteoarthritis     Patient Active Problem List   Diagnosis Date Noted  . NSTEMI (non-ST elevated myocardial infarction) ([place]) 10/16/2018  . Chronic pain syndrome 05/29/2018  . Type 2 diabetes mellitus with stage 3 chronic kidney disease, with long-term current use of insulin ([place]) 04/07/2018  . CKD (chronic kidney disease) stage 3, GFR 30-59 ml/min (HCC) 03/11/2018  . Accident due to mechanical fall without injury 11/20/2017  . AKI (acute kidney injury) ([place]) 11/20/2017  . Anemia 11/20/2017  . Hypoglycemia 11/19/2017  . Essential hypertension 11/19/2017    Past Surgical History:  Procedure Laterality Date  . JOINT REPLACEMENT          Home Medications    Prior to Admission medications   Medication Sig Start Date End Date Taking?  Authorizing Provider  acetaminophen (TYLENOL) 500 MG tablet Take 1,000 mg by mouth 2 (two) times daily.    Yes [provider]  amLODipine (NORVASC) 10 MG tablet Take 1 tablet (10 mg total) by mouth daily. 09/05/18  Yes Billie Ruddy, MD  aspirin EC 81 MG tablet Take 1 tablet (81 mg total) by mouth daily. 03/06/18  Yes Billie Ruddy, MD  cholecalciferol (VITAMIN D) 25 MCG (1000 UT) tablet Take 1,000 Units by mouth daily.    Yes [provider]  clopidogrel (PLAVIX) 75 MG tablet Take 1 tablet (75 mg total) by mouth daily. 03/06/18  Yes Billie Ruddy, MD  Cyanocobalamin 2500 MCG CHEW Chew 2,500 mcg by mouth daily.   Yes [provider]  furosemide (LASIX) 20 MG tablet TAKE 1 TABLET BY MOUTH ONCE DAILY Patient taking differently: Take 20 mg by mouth daily as needed for fluid.  10/10/18  Yes Billie Ruddy, MD  gabapentin (NEURONTIN) 600 MG tablet Take 2 tablets (1,200 mg total) by mouth 3 (three) times daily. 05/29/18 08/05/26 Yes Patel, Domenick Bookbinder, MD  Hyprom-Naphaz-Polysorb-Zn Sulf (CLEAR EYES COMPLETE OP) Place 4-5 drops into both eyes daily as needed (dry eyes).   Yes [provider]  insulin aspart (NOVOLOG) 100 UNIT/ML injection Inject 4 Units into the skin 2 (two) times daily. Patient taking differently: Inject 4 Units into the skin 3 (three) times daily as needed (if blood sugar is  greater than 160.).  08/19/18  Yes Billie Ruddy, MD  methocarbamol (ROBAXIN) 500 MG tablet TAKE 1 TABLET BY MOUTH TWICE DAILY AS NEEDED FOR MUSCLE SPASM Patient taking differently: Take 500 mg by mouth 2 (two) times daily as needed for muscle spasms.  08/19/18  Yes Jamse Arn, MD  metoprolol tartrate (LOPRESSOR) 100 MG tablet Take 1 tablet (100 mg total) by mouth 2 (two) times daily. 03/06/18  Yes Billie Ruddy, MD  montelukast (SINGULAIR) 10 MG tablet Take 1 tablet (10 mg total) by mouth at bedtime. Patient taking differently: Take 10 mg by mouth every morning.   03/06/18  Yes Billie Ruddy, MD  omeprazole (PRILOSEC) 20 MG capsule Take 20 mg by mouth daily.    Yes [provider]  TRESIBA FLEXTOUCH 100 UNIT/ML SOPN FlexTouch Pen INJECT 50 UNITS UNDER THE SKIN EVERY MORNING Patient taking differently: Inject 35 Units into the skin at bedtime.  04/25/18  Yes Billie Ruddy, MD  valsartan (DIOVAN) 320 MG tablet TAKE 1 TABLET BY MOUTH EVERY DAY Patient taking differently: Take 320 mg by mouth daily.  08/07/18  Yes Billie Ruddy, MD  vitamin E (VITAMIN E) 1000 UNIT capsule Take 1,000 Units by mouth daily.    Yes [provider]  zinc gluconate 50 MG tablet Take 50 mg by mouth 2 (two) times daily.   Yes [provider]  BD PEN NEEDLE NANO U/F 32G X 4 MM MISC USE AS DIRECTED FOR TRESIBA INJECTIONS ONCE DAILY 05/28/18   Nafziger, Tommi Rumps, NP  rosuvastatin (CRESTOR) 10 MG tablet Take 1 tablet (10 mg total) by mouth daily at 6 PM. Patient not taking: Reported on 10/16/2018 03/06/18   Billie Ruddy, MD    Family History Family History  Problem Relation Age of Onset  . Hypertension Mother   . Diabetes Mother   . Hyperlipidemia Mother   . Hypertension Father   . Hypertension Sister   . Cancer Sister     Social History Social History   Tobacco Use  . Smoking status: Former Smoker    Years: 20.00    Last attempt to quit: 1985    Years since quitting: 34.9  . Smokeless tobacco: Never Used  Substance Use Topics  . Alcohol use: Yes    Comment: occasional  . Drug use: No     Allergies   Patient has no known allergies.   Review of Systems Review of Systems  Constitutional: Positive for diaphoresis and fatigue. Negative for chills and fever.  HENT: Negative for congestion and rhinorrhea.   Eyes: Negative for visual disturbance.  Respiratory: Positive for chest tightness and shortness of breath. Negative for cough and wheezing.   Cardiovascular: Negative for chest pain and leg swelling.  Gastrointestinal: Negative for  abdominal pain, diarrhea, nausea and vomiting.  Genitourinary: Negative for dysuria and flank pain.  Musculoskeletal: Negative for neck pain and neck stiffness.  Skin: Negative for rash and wound.  Allergic/Immunologic: Negative for immunocompromised state.  Neurological: Positive for weakness. Negative for syncope and headaches.  All other systems reviewed and are negative.    Physical Exam Updated Vital Signs BP (!) 169/132   Pulse 79   Temp 98.2 F (36.8 C) (Oral)   Resp 19   Ht 6\' 3"  (1.905 m)   Wt 98 kg   SpO2 99%   BMI 27.00 kg/m   Physical Exam Vitals signs and nursing note reviewed.  Constitutional:      General: He is  not in acute distress.    Appearance: He is well-developed.  HENT:     Head: Normocephalic and atraumatic.  Eyes:     Conjunctiva/sclera: Conjunctivae normal.  Neck:     Musculoskeletal: Neck supple.  Cardiovascular:     Rate and Rhythm: Normal rate and regular rhythm.     Heart sounds: Normal heart sounds. No murmur. No friction rub.  Pulmonary:     Effort: Pulmonary effort is normal. No respiratory distress.     Breath sounds: Normal breath sounds. No wheezing or rales.  Abdominal:     General: There is no distension.  Musculoskeletal:     Right lower leg: Edema (trace) present.     Left lower leg: Edema (trace) present.  Skin:    General: Skin is warm.     Capillary Refill: Capillary refill takes less than 2 seconds.  Neurological:     Mental Status: He is alert and oriented to person, place, and time.     Motor: No abnormal muscle tone.      ED Treatments / Results  Labs (all labs ordered are listed, but only abnormal results are displayed) Labs Reviewed  CBC WITH DIFFERENTIAL/PLATELET - Abnormal; Notable for the following components:      Result Value   Hemoglobin 12.1 (*)    HCT 38.9 (*)    All other components within normal limits  TROPONIN I - Abnormal; Notable for the following components:   Troponin I 0.04 (*)    All  other components within normal limits  COMPREHENSIVE METABOLIC PANEL - Abnormal; Notable for the following components:   Glucose, Bld 172 (*)    Creatinine, Ser 2.34 (*)    Calcium 8.2 (*)    Total Protein 6.0 (*)    Albumin 2.2 (*)    GFR calc non Af Amer 28 (*)    GFR calc Af Amer 32 (*)    All other components within normal limits  BRAIN NATRIURETIC PEPTIDE - Abnormal; Notable for the following components:   B Natriuretic Peptide 438.3 (*)    All other components within normal limits  I-STAT CHEM 8, ED - Abnormal; Notable for the following components:   Creatinine, Ser 2.50 (*)    Glucose, Bld 168 (*)    Calcium, Ion 1.13 (*)    Hemoglobin 12.2 (*)    HCT 36.0 (*)    All other components within normal limits  TROPONIN I  TROPONIN I  TROPONIN I  I-STAT TROPONIN, ED    EKG EKG Interpretation  Date/Time:  Thursday October 16 2018 09:50:21 EST Ventricular Rate:  98 PR Interval:    QRS Duration: 93 QT Interval:  366 QTC Calculation: 468 R Axis:   22 Text Interpretation:  Sinus rhythm Ventricular premature complex LVH with secondary repolarization abnormality Since last EKG, more evidence of LVH Confirmed by Duffy Bruce [id number]) on 10/16/2018 10:09:31 AM   Radiology Dg Chest Portable 1 View  Result Date: 10/16/2018 CLINICAL DATA:  Chest pain and hypertension EXAM: PORTABLE CHEST 1 VIEW COMPARISON:  06/06/2018 FINDINGS: Mild cardiomegaly. Negative aortic and hilar contours. There is no edema, consolidation, effusion, or pneumothorax. Chronic metallic foreign body along the left posterior back (based on prior lateral view). IMPRESSION: No evidence of acute disease. Electronically Signed   By: Monte Fantasia M.D.   On: 10/16/2018 10:22    Procedures .Critical Care Performed by: Duffy Bruce, MD Authorized by: Duffy Bruce, MD   Critical care provider statement:    Critical care  time (minutes):  35   Critical care time was exclusive of:  Separately billable  procedures and treating other patients and teaching time   Critical care was necessary to treat or prevent imminent or life-threatening deterioration of the following conditions:  Circulatory failure and cardiac failure   Critical care was time spent personally by me on the following activities:  Development of treatment plan with patient or surrogate, discussions with consultants, evaluation of patient's response to treatment, examination of patient, obtaining history from patient or surrogate, ordering and performing treatments and interventions, ordering and review of laboratory studies, ordering and review of radiographic studies, pulse oximetry, re-evaluation of patient's condition and review of old charts   I assumed direction of critical care for this patient from another provider in my specialty: no     (including critical care time)  Medications Ordered in ED Medications  nitroGLYCERIN 50 mg in dextrose 5 % 250 mL (0.2 mg/mL) infusion (5 mcg/min Intravenous New Bag/Given 10/16/18 1406)  metoprolol tartrate (LOPRESSOR) tablet 100 mg (has no administration in time range)  aspirin chewable tablet 324 mg (324 mg Oral Given 10/16/18 1027)  amLODipine (NORVASC) tablet 10 mg (10 mg Oral Given 10/16/18 1119)  alum & mag hydroxide-simeth (MAALOX/MYLANTA) 200-200-20 MG/5ML suspension 30 mL (30 mLs Oral Given 10/16/18 1312)    And  lidocaine (XYLOCAINE) 2 % viscous mouth solution 15 mL (15 mLs Oral Given 10/16/18 1313)     Initial Impression / Assessment and Plan / ED Course  I have reviewed the triage vital signs and the nursing notes.  Pertinent labs & imaging results that were available during my care of the patient were reviewed by me and considered in my medical decision making (see chart for details).  Clinical Course as of Oct 16 1258  Thu Oct 16, 2018  1238 67 yo M here with chest pressure ("indigestion"), diaphoresis, SOB. Responded very well to nitro. Pt markedly hypertensive on  arrival as well. Concern for NSTEMI vs HTN urgency. EKG shows possible peaking of T waves but I suspect this is more so 2/2 his LVH, though early ACS is also a consideration. ASA given. Nitro, heparin gtt started. Pain is not c/w dissection clinically. Will consult Cardiology for admission.   [CI]    Clinical Course User Index [CI] Duffy Bruce, MD    Dr. Virgina Jock has seen pt. He suspect this may be more so 2/2 a hypertensive urgency/emergency, versus true ACS, and is hesitant to cath pt in setting of his CKD. He recommends management for HTN urgency, admission for observation w/ hospitalist, and will continue to follow.  Final Clinical Impressions(s) / ED Diagnoses   Final diagnoses:  NSTEMI (non-ST elevated myocardial infarction) [facility])    ED Discharge Orders    None       Duffy Bruce, MD 10/16/18 1240    Duffy Bruce, MD 10/16/18 1259    Duffy Bruce, MD 10/16/18 1530

## 2018-10-16 NOTE — ED Triage Notes (Signed)
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  He was eating this morning and feeling iincreasingly dizzy and started vomiting.  EMS was called pt his history of hypertension and took his BP meds this morning however his BP with EMS was 238/140.  Pt states that he has central chest pain describes as heartburn.  Pt also over the last month was prescribed PRN lasix and reports increasingly dark urine.  NAD noted at this time 4mg  of zofran given IV in route.

## 2018-10-16 NOTE — ED Notes (Signed)
Report given to Kim RN.

## 2018-10-16 NOTE — H&P (Signed)
History and Physical    [name] QZR:[id number] DOB: 01/18/1951 DOA: 10/16/2018  PCP: Billie Ruddy, MD Consultants:  Posey Pronto - PM&R; referred to endocrinology Patient coming from: Home - lives alone, moved here a year ago from [place]; [place]: Brother, [phone], [phone]  Chief Complaint:  Dizziness  HPI: William Webb is a 66 y.o. male with medical history significant of HTN, HLD, and DM presenting with dizziness.  He was out for breakfast this AM and he took his medication and is supposed to eat something within 10 minutes of taking it.  It was 40 minutes later and he had not eaten and he got overheated and nauseated.  He was unable to get up.  It happened again and he was coming around and drank some OJ and then vomiting.  He had some burning in his chest like indigestion.  It felt like something was hung in his chest, like a tight fist in his chest.  He has had similar symptoms before and thinks it was related to not eating when taking his medicine.  He got NTG and it started easing off.  He does have chronically uncontrolled HTN.  He ran out of his Norvasc on Tuesday and Lopressor and Diovan on Saturday.  He feels great now.   ED Course:   Concern for NSTEMI - eating breakfast and acutely diaphoretic, indigestion, pre-syncope.  Recurrent dull indigestion feeling.  Prior h/o similar and "needed stent".  EKG ok.  Troponin 0.04.  Responded well to NTG.  Also with BP 240s.  Placed on NTG drip.  Called cards to admit and they think HTN urgency and GI related.  Cards recommends holding heparin.    Review of Systems: As per HPI; otherwise review of systems reviewed and negative.   Ambulatory Status:  Ambulates without assistance  Past Medical History:  Diagnosis Date  . Allergy   . CKD (chronic kidney disease) stage 3, GFR 30-59 ml/min (HCC)   . Diabetes mellitus without complication ([place])   . Hyperlipidemia   . Hypertension   . Hypertensive crisis 10/16/2018   . Neuropathy   . Noncompliance with medication regimen   . Osteoarthritis     Past Surgical History:  Procedure Laterality Date  . JOINT REPLACEMENT      Social History   Socioeconomic History  . Marital status: Legally Separated    Spouse name: Not on file  . Number of children: Not on file  . Years of education: Not on file  . Highest education level: Not on file  Occupational History  . Not on file  Social Needs  . Financial resource strain: Not on file  . Food insecurity:    Worry: Not on file    Inability: Not on file  . Transportation needs:    Medical: Not on file    Non-medical: Not on file  Tobacco Use  . Smoking status: Former Smoker    Years: 20.00    Last attempt to quit: 1985    Years since quitting: 34.9  . Smokeless tobacco: Never Used  Substance and Sexual Activity  . Alcohol use: Yes    Comment: occasional  . Drug use: No  . Sexual activity: Not on file  Lifestyle  . Physical activity:    Days per week: Not on file    Minutes per session: Not on file  . Stress: Not on file  Relationships  . Social connections:    Talks on phone: Not on file  Gets together: Not on file    Attends religious service: Not on file    Active member of club or organization: Not on file    Attends meetings of clubs or organizations: Not on file    Relationship status: Not on file  . Intimate partner violence:    Fear of current or ex partner: Not on file    Emotionally abused: Not on file    Physically abused: Not on file    Forced sexual activity: Not on file  Other Topics Concern  . Not on file  Social History Narrative  . Not on file    No Known Allergies  Family History  Problem Relation Age of Onset  . Hypertension Mother   . Diabetes Mother   . Hyperlipidemia Mother   . Hypertension Father   . Hypertension Sister   . Cancer Sister     Prior to Admission medications   Medication Sig Start Date End Date Taking? Authorizing Provider    acetaminophen (TYLENOL) 500 MG tablet Take 1,000 mg by mouth 2 (two) times daily.    Yes [provider]  amLODipine (NORVASC) 10 MG tablet Take 1 tablet (10 mg total) by mouth daily. 09/05/18  Yes Billie Ruddy, MD  aspirin EC 81 MG tablet Take 1 tablet (81 mg total) by mouth daily. 03/06/18  Yes Billie Ruddy, MD  cholecalciferol (VITAMIN D) 25 MCG (1000 UT) tablet Take 1,000 Units by mouth daily.    Yes [provider]  clopidogrel (PLAVIX) 75 MG tablet Take 1 tablet (75 mg total) by mouth daily. 03/06/18  Yes Billie Ruddy, MD  Cyanocobalamin 2500 MCG CHEW Chew 2,500 mcg by mouth daily.   Yes [provider]  furosemide (LASIX) 20 MG tablet TAKE 1 TABLET BY MOUTH ONCE DAILY Patient taking differently: Take 20 mg by mouth daily as needed for fluid.  10/10/18  Yes Billie Ruddy, MD  gabapentin (NEURONTIN) 600 MG tablet Take 2 tablets (1,200 mg total) by mouth 3 (three) times daily. 05/29/18 08/05/26 Yes Patel, Domenick Bookbinder, MD  Hyprom-Naphaz-Polysorb-Zn Sulf (CLEAR EYES COMPLETE OP) Place 4-5 drops into both eyes daily as needed (dry eyes).   Yes [provider]  insulin aspart (NOVOLOG) 100 UNIT/ML injection Inject 4 Units into the skin 2 (two) times daily. Patient taking differently: Inject 4 Units into the skin 3 (three) times daily as needed (if blood sugar is greater than 160.).  08/19/18  Yes Banks, Langley Adie, MD  methocarbamol (ROBAXIN) 500 MG tablet TAKE 1 TABLET BY MOUTH TWICE DAILY AS NEEDED FOR MUSCLE SPASM Patient taking differently: Take 500 mg by mouth 2 (two) times daily as needed for muscle spasms.  08/19/18  Yes Jamse Arn, MD  metoprolol tartrate (LOPRESSOR) 100 MG tablet Take 1 tablet (100 mg total) by mouth 2 (two) times daily. 03/06/18  Yes Billie Ruddy, MD  montelukast (SINGULAIR) 10 MG tablet Take 1 tablet (10 mg total) by mouth at bedtime. Patient taking differently: Take 10 mg by mouth every morning.  03/06/18  Yes Billie Ruddy, MD  omeprazole (PRILOSEC) 20 MG capsule Take 20 mg by mouth daily.    Yes [provider]  TRESIBA FLEXTOUCH 100 UNIT/ML SOPN FlexTouch Pen INJECT 50 UNITS UNDER THE SKIN EVERY MORNING Patient taking differently: Inject 35 Units into the skin at bedtime.  04/25/18  Yes Billie Ruddy, MD  valsartan (DIOVAN) 320 MG tablet TAKE 1 TABLET BY MOUTH EVERY DAY Patient  taking differently: Take 320 mg by mouth daily.  08/07/18  Yes Billie Ruddy, MD  vitamin E (VITAMIN E) 1000 UNIT capsule Take 1,000 Units by mouth daily.    Yes [provider]  zinc gluconate 50 MG tablet Take 50 mg by mouth 2 (two) times daily.   Yes [provider]  BD PEN NEEDLE NANO U/F 32G X 4 MM MISC USE AS DIRECTED FOR TRESIBA INJECTIONS ONCE DAILY 05/28/18   Nafziger, Tommi Rumps, NP  rosuvastatin (CRESTOR) 10 MG tablet Take 1 tablet (10 mg total) by mouth daily at 6 PM. Patient not taking: Reported on 10/16/2018 03/06/18   Billie Ruddy, MD    Physical Exam: Vitals:   10/16/18 1430 10/16/18 1445 10/16/18 1500 10/16/18 1600  BP: (!) 179/102  (!) 169/132 (!) 192/118  Pulse: 78 77 79 87  Resp: 17 16 19 18   Temp:      TempSrc:      SpO2: 99% 99% 99% 100%  Weight:      Height:         General:  Appears calm and comfortable and is NAD Eyes:  PERRL, EOMI, normal lids, iris ENT:  grossly normal hearing, lips & tongue, mmm; suboptimal dentition Neck:  no LAD, masses or thyromegaly Cardiovascular:  RRR, no m/r/g. No LE edema.  Respiratory:   CTA bilaterally with no wheezes/rales/rhonchi.  Normal respiratory effort. Abdomen:  soft, NT, ND, NABS Skin:  no rash or induration seen on limited exam Musculoskeletal:  grossly normal tone BUE/BLE, good ROM, no bony abnormality Psychiatric:  grossly normal mood and affect, speech fluent and appropriate, AOx3 Neurologic:  CN 2-12 grossly intact, moves all extremities in coordinated fashion, sensation intact    Radiological Exams on  Admission: Dg Chest Portable 1 View  Result Date: 10/16/2018 CLINICAL DATA:  Chest pain and hypertension EXAM: PORTABLE CHEST 1 VIEW COMPARISON:  06/06/2018 FINDINGS: Mild cardiomegaly. Negative aortic and hilar contours. There is no edema, consolidation, effusion, or pneumothorax. Chronic metallic foreign body along the left posterior back (based on prior lateral view). IMPRESSION: No evidence of acute disease. Electronically Signed   By: Monte Fantasia M.D.   On: 10/16/2018 10:22    EKG: Independently reviewed.  NSR with rate 87; LVH; nonspecific ST changes with no evidence of acute ischemia   Labs on Admission: I have personally reviewed the available labs and imaging studies at the time of the admission.  Pertinent labs:   Glucose 172 BUN 20/Creatinine 2.34/GFR 32; 24/2.30/36.47 Albumin 2.2 BNP 438.3 Troponin 0.04 Hgb 12.1   Assessment/Plan Principal Problem:   Hypertensive crisis Active Problems:   CKD (chronic kidney disease) stage 3, GFR 30-59 ml/min (HCC)   Type 2 diabetes mellitus with stage 3 chronic kidney disease, with long-term current use of insulin (HCC)   Hyperlipidemia   Noncompliance with medication regimen   Hypertensive crisis -Patient presenting with chest discomfort, diaphoresis, and dizziness concerning for hypertensive crisis -He did have evidence of end organ failure, with mildly elevated BNP (none prior) and troponin -The patient was placed on NTG drip with improvement in symptoms and BP -The patient received home doses of Norvasc and Lopressor with subsequent decrease in BP without apparent difficulty -Based on his BP control no longer in a dangerous range and low suspicion for ACS, will observe patient on telemetry rather than placing in ICU; if improving, he may be appropriate for d/c tomorrow -Will cycle troponin - initial mild elevation thought to be related to demand ischemia -Cardiology  has consulted, note pending -Will resume home PO Norvasc  and Lopressor but hold Diovan based on CKD -Will add prn hydralazine  DM -A1c 7.1 in 10/19 -Continue Tresiba -Cover with moderate-scale SSI  Stage 3 CKD -Appears to be stable at this time although he is approaching stage IV CKD -Will follow as BP is controlled  HLD -Resume Crestor - patient appears to have not been taking it, but this is a reasonable medication for him   Medication non-compliance Medical non-compliance - Ramifications of not taking his chronic controlling medications reviewed and patient voices understanding with importance of taking medications, following up with appointments, etc.  DVT prophylaxis:  Lovenox  Code Status:  Full - confirmed with patient Family Communication: Various family members present during evaluation Disposition Plan:  Home once clinically improved Consults called: Cardiology  Admission status: It is my clinical opinion that referral for OBSERVATION is reasonable and necessary in this patient based on the above information provided. The aforementioned taken together are felt to place the patient at high risk for further clinical deterioration. However it is anticipated that the patient may be medically stable for discharge from the hospital within 24 to 48 hours.    Karmen Bongo MD [facility]  If note is complete, please contact covering daytime or nighttime physician. [email or web address] Password [email or web address]  10/16/2018, 5:12 PM

## 2018-10-17 ENCOUNTER — Inpatient Hospital Stay (HOSPITAL_COMMUNITY): Payer: BLUE CROSS/BLUE SHIELD

## 2018-10-17 DIAGNOSIS — Z87891 Personal history of nicotine dependence: Secondary | ICD-10-CM | POA: Diagnosis not present

## 2018-10-17 DIAGNOSIS — E785 Hyperlipidemia, unspecified: Secondary | ICD-10-CM | POA: Diagnosis present

## 2018-10-17 DIAGNOSIS — E1142 Type 2 diabetes mellitus with diabetic polyneuropathy: Secondary | ICD-10-CM | POA: Diagnosis present

## 2018-10-17 DIAGNOSIS — I2510 Atherosclerotic heart disease of native coronary artery without angina pectoris: Secondary | ICD-10-CM | POA: Diagnosis present

## 2018-10-17 DIAGNOSIS — I129 Hypertensive chronic kidney disease with stage 1 through stage 4 chronic kidney disease, or unspecified chronic kidney disease: Secondary | ICD-10-CM | POA: Diagnosis present

## 2018-10-17 DIAGNOSIS — I214 Non-ST elevation (NSTEMI) myocardial infarction: Secondary | ICD-10-CM

## 2018-10-17 DIAGNOSIS — Z794 Long term (current) use of insulin: Secondary | ICD-10-CM | POA: Diagnosis not present

## 2018-10-17 DIAGNOSIS — Z9114 Patient's other noncompliance with medication regimen: Secondary | ICD-10-CM | POA: Diagnosis not present

## 2018-10-17 DIAGNOSIS — Z7982 Long term (current) use of aspirin: Secondary | ICD-10-CM | POA: Diagnosis not present

## 2018-10-17 DIAGNOSIS — N183 Chronic kidney disease, stage 3 (moderate): Secondary | ICD-10-CM | POA: Diagnosis present

## 2018-10-17 DIAGNOSIS — E11649 Type 2 diabetes mellitus with hypoglycemia without coma: Secondary | ICD-10-CM | POA: Diagnosis not present

## 2018-10-17 DIAGNOSIS — Z7902 Long term (current) use of antithrombotics/antiplatelets: Secondary | ICD-10-CM | POA: Diagnosis not present

## 2018-10-17 DIAGNOSIS — E1122 Type 2 diabetes mellitus with diabetic chronic kidney disease: Secondary | ICD-10-CM | POA: Diagnosis present

## 2018-10-17 DIAGNOSIS — Z79899 Other long term (current) drug therapy: Secondary | ICD-10-CM | POA: Diagnosis not present

## 2018-10-17 DIAGNOSIS — I160 Hypertensive urgency: Secondary | ICD-10-CM | POA: Diagnosis present

## 2018-10-17 DIAGNOSIS — D631 Anemia in chronic kidney disease: Secondary | ICD-10-CM | POA: Diagnosis present

## 2018-10-17 LAB — GLUCOSE, CAPILLARY
Glucose-Capillary: 88 mg/dL (ref 70–99)
Glucose-Capillary: 119 mg/dL — ABNORMAL HIGH (ref 70–99)
Glucose-Capillary: 125 mg/dL — ABNORMAL HIGH (ref 70–99)
Glucose-Capillary: 103 mg/dL — ABNORMAL HIGH (ref 70–99)

## 2018-10-17 LAB — CBC
HCT: 32.2 % — ABNORMAL LOW (ref 39.0–52.0)
Hemoglobin: 10.5 g/dL — ABNORMAL LOW (ref 13.0–17.0)
MCH: 27.6 pg (ref 26.0–34.0)
MCHC: 32.6 g/dL (ref 30.0–36.0)
MCV: 84.7 fL (ref 80.0–100.0)
Platelets: 188 10*3/uL (ref 150–400)
RBC: 3.8 MIL/uL — ABNORMAL LOW (ref 4.22–5.81)
RDW: 13.7 % (ref 11.5–15.5)
WBC: 4.3 10*3/uL (ref 4.0–10.5)
nRBC: 0 % (ref 0.0–0.2)

## 2018-10-17 LAB — BASIC METABOLIC PANEL
Anion gap: 6 (ref 5–15)
BUN: 23 mg/dL (ref 8–23)
CO2: 25 mmol/L (ref 22–32)
Calcium: 7.7 mg/dL — ABNORMAL LOW (ref 8.9–10.3)
Chloride: 108 mmol/L (ref 98–111)
Creatinine, Ser: 2.48 mg/dL — ABNORMAL HIGH (ref 0.61–1.24)
GFR calc Af Amer: 30 mL/min — ABNORMAL LOW (ref >60)
GFR calc non Af Amer: 26 mL/min — ABNORMAL LOW (ref >60)
Glucose, Bld: 125 mg/dL — ABNORMAL HIGH (ref 70–99)
Potassium: 4.4 mmol/L (ref 3.5–5.1)
Sodium: 139 mmol/L (ref 135–145)

## 2018-10-17 LAB — TROPONIN I: Troponin I: 4.93 ng/mL (ref <0.03)

## 2018-10-17 LAB — ECHOCARDIOGRAM COMPLETE
Height: 75 in
Weight: 3408 oz

## 2018-10-17 LAB — HIV ANTIBODY (ROUTINE TESTING W REFLEX): HIV Screen 4th Generation wRfx: NONREACTIVE

## 2018-10-17 LAB — HEPARIN LEVEL (UNFRACTIONATED): Heparin Unfractionated: 0.36 IU/mL (ref 0.30–0.70)

## 2018-10-17 MED ORDER — HYDRALAZINE HCL 25 MG PO TABS
25.0000 mg | ORAL_TABLET | Freq: Three times a day (TID) | ORAL | Status: DC
  Administered 2018-10-17 – 2018-10-18 (×3): 25 mg via ORAL
  Filled 2018-10-17 (×3): qty 1

## 2018-10-17 MED ORDER — GABAPENTIN 300 MG PO CAPS
300.0000 mg | ORAL_CAPSULE | Freq: Two times a day (BID) | ORAL | Status: DC
  Administered 2018-10-18 – 2018-10-21 (×7): 300 mg via ORAL
  Filled 2018-10-17 (×7): qty 1

## 2018-10-17 MED ORDER — SODIUM CHLORIDE 0.9 % IV SOLN
INTRAVENOUS | Status: AC
  Administered 2018-10-17 (×2): via INTRAVENOUS

## 2018-10-17 MED ORDER — SODIUM CHLORIDE 0.9 % IV SOLN
INTRAVENOUS | Status: AC
  Administered 2018-10-19: 19:00:00 via INTRAVENOUS

## 2018-10-17 MED ORDER — ACETYLCYSTEINE 20 % IN SOLN
1200.0000 mg | Freq: Two times a day (BID) | RESPIRATORY_TRACT | Status: DC
  Filled 2018-10-17: qty 8

## 2018-10-17 NOTE — Progress Notes (Signed)
Prior cath in 2017 at [facility] in [place], [place]. Need to get records. Hold cath today. Recommend nephrology consult. Will readdress if any Cr improvement by Monday. Continue medical management with aspirin/heparin for now.  Nigel Mormon, MD [facility] [place] Pager: [phone] Office: [phone] If no answer Cell [phone]

## 2018-10-17 NOTE — Progress Notes (Signed)
ANTICOAGULATION CONSULT NOTE   Pharmacy Consult for Heparin Indication: NSTEMI  No Known Allergies  Patient Measurements: Height: 6\' 3"  (190.5 cm) Weight: 213 lb (96.6 kg) IBW/kg (Calculated) : 84.5 Heparin Dosing Weight: 98 kg  Vital Signs: Temp: 98.2 F (36.8 C) (12/20 0744) Temp Source: Oral (12/20 0744) BP: 196/107 (12/20 0744) Pulse Rate: 67 (12/20 0744)  Labs: Recent Labs    10/16/18 0955 10/16/18 1034 10/16/18 1825 10/16/18 2136 10/17/18 0413 10/17/18 0622  HGB 12.1* 12.2*  --   --   --  10.5*  HCT 38.9* 36.0*  --   --   --  32.2*  PLT 217  --   --   --   --  188  HEPARINUNFRC  --   --   --   --   --  0.36  CREATININE 2.34* 2.50*  --   --   --  2.48*  TROPONINI 0.04*  --  6.69* 4.73* 4.93*  --     Estimated Creatinine Clearance: 34.1 mL/min (A) (by C-G formula based on SCr of 2.48 mg/dL (H)).  Assessment: 67 y.o. M on heparin for NSTEMI. Noted plans for possible cath on Monday depending on renal function.  Heparin level 0.36 (therapeutic) on gtt at 1150 units/hr. Hgb down to 10.5, plt 188. No bleeding noted.  Goal of Therapy:  Heparin level 0.3-0.7 units/ml Monitor platelets by anticoagulation protocol: Yes   Plan:  Heparin drip at  1150 units/hr Daily heparin level and CBC  Sherlon Handing, PharmD, BCPS Clinical pharmacist  **Pharmacist phone directory can now be found on [email or web address] (PW [email or web address]).  Listed under [place]. 10/17/2018 8:55 AM
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
## 2018-10-17 NOTE — Progress Notes (Signed)
  Echocardiogram 2D Echocardiogram has been performed.  William Webb 10/17/2018, 5:24 PM

## 2018-10-17 NOTE — Progress Notes (Signed)
  Echocardiogram 2D Echocardiogram has been attempted. Patient not in room.  William Webb 10/17/2018, 2:14 PM

## 2018-10-17 NOTE — Progress Notes (Signed)
[facility] PROGRESS NOTE  [facility] QBH:[id number] DOB: 1951/08/26 DOA: 10/16/2018 PCP: Billie Ruddy, MD  Assessment/Plan:  # NSTEMI : patient presented with chest discomfort, diaphoresis, dizziness. Initially troponin within limit of normal and then went to greater than 6. Trending down now. Evaluated by cardiology who opine NSTEMI recommending asa/heparin/crestor and cath. Currently chest pain free. Per cards holding off on cath til Monday giving time for assessment and recommendations from nephrology. -gently IV fluids.  -continue heparin gtt -follow echo  # CKD stage III - Secondary to diabetic nephropathy and microvascular disease from HTN.  Note also prior NSAID use.  Baseline Cr 1.8 -2.2 and he is currently slightly above his baseline. Evaluated by nephrology who recommend: - urine protein/creatinine ratio - renal ultrasound - Holding valsartan and lasix for now - Daily BMP - Continue hydration as tolerated for now.  Reduce to 75/hr x 12 hours -reduction of gabapentin  # HTN with CKD : remains difficult to control.  - Holding valsartan and lasix to optimize renal function for cath per nephrology - Start hydralazine   # DM with CKD : creatinine 2.48. Above baseline. Evaluated by nephrology who recommend improved control. A1c 7.1 10/19. Home regimen includes novolog and tresiba -continue lanuts 35 units -continue SSI -monitor  Code Status: full Family Communication: none present Disposition Plan: home when ready   Consultants:  [facility] nephrology  [facility]ology  Procedures:  Echo  Renal US  Antibiotics:  HPI/Subjective:  67 y.o. male with a history of diabetes, hypertension, and chronic kidney disease who presented to the hospital on 12/19 after an episode at a restaurant with dizziness and heartburn.  The patient stated that he became overheated and then had emesis and fell when he attempted to get up.  His family called 7  and they brought him to the ER. Developed elevated troponin and cards recommended heparin gtt and cath. Cath on hold due to renal function. Nephrology assisting with recommendations pre cath. BP remains difficult to control  Objective: Vitals:   10/17/18 0744 10/17/18 1222  BP: (!) 196/107 (!) 175/110  Pulse: 67   Resp: 20   Temp: 98.2 F (36.8 C)   SpO2: 100%     Intake/Output Summary (Last 24 hours) at 10/17/2018 1436 Last data filed at 10/17/2018 1227 Gross per 24 hour  Intake 2154.68 ml  Output -  Net 2154.68 ml   Filed Weights   10/16/18 0953 10/16/18 1721 10/17/18 0500  Weight: 98 kg 96.7 kg 96.6 kg    Exam:   General:  Sitting up in bed eating breakfast in no acute distress  Cardiovascular: rrr no mgr trace LE edema  Respiratory: normal effort BS clear bilaterally no wheeze  Abdomen: non-distended no tender +BS no guarding or rebounding  Musculoskeletal: joints without swelling/erythema   Data Reviewed: Basic Metabolic Panel: Recent Labs  Lab 10/16/18 0955 10/16/18 1034 10/17/18 0622  NA 138 139 139  K 4.2 4.2 4.4  CL 107 108 108  CO2 22  --  25  GLUCOSE 172* 168* 125*  BUN 20 21 23   CREATININE 2.34* 2.50* 2.48*  CALCIUM 8.2*  --  7.7*   Liver Function Tests: Recent Labs  Lab 10/16/18 0955  AST 16  ALT 11  ALKPHOS 116  BILITOT 0.7  PROT 6.0*  ALBUMIN 2.2*   No results for input(s): LIPASE, AMYLASE in the last 168 hours. No results for input(s): AMMONIA in the last 168 hours. CBC: Recent Labs  Lab 10/16/18  9485 10/16/18 1034 10/17/18 0622  WBC 4.9  --  4.3  NEUTROABS 3.2  --   --   HGB 12.1* 12.2* 10.5*  HCT 38.9* 36.0* 32.2*  MCV 87.8  --  84.7  PLT 217  --  188   Cardiac Enzymes: Recent Labs  Lab 10/16/18 0955 10/16/18 1825 10/16/18 2136 10/17/18 0413  TROPONINI 0.04* 6.69* 4.73* 4.93*   BNP (last 3 results) Recent Labs    10/16/18 0955  BNP 438.3*    ProBNP (last 3 results) No results for input(s): PROBNP in the  last 8760 hours.  CBG: Recent Labs  Lab 10/16/18 1722 10/16/18 2152 10/17/18 0740 10/17/18 1200  GLUCAP 208* 125* 88 119*    No results found for this or any previous visit (from the past 240 hour(s)).   Studies: US Renal  Result Date: 10/17/2018 CLINICAL DATA:  67 year old male with chronic kidney disease EXAM: RENAL / URINARY TRACT ULTRASOUND COMPLETE COMPARISON:  None. FINDINGS: Right Kidney: Renal measurements: 12.0 x 6.1 x 7.3 centimeters = volume: 279 mL. Cortical echogenicity within normal limits. No right hydronephrosis. Simple appearing upper pole cyst measuring 18 millimeters. No solid right renal mass. Left Kidney: Renal measurements: 10.9 x 5.8 x 5.7 centimeters = volume: 188 mL. Echogenicity within normal limits. No mass or hydronephrosis visualized. Bladder: Appears normal for degree of bladder distention. Other findings: Prostate enlargement up to 6.4 centimeters diameter. IMPRESSION: Negative ultrasound appearance of both kidneys and the urinary bladder. Electronically Signed   By: Genevie Ann M.D.   On: 10/17/2018 14:22   Dg Chest Portable 1 View  Result Date: 10/16/2018 CLINICAL DATA:  Chest pain and hypertension EXAM: PORTABLE CHEST 1 VIEW COMPARISON:  06/06/2018 FINDINGS: Mild cardiomegaly. Negative aortic and hilar contours. There is no edema, consolidation, effusion, or pneumothorax. Chronic metallic foreign body along the left posterior back (based on prior lateral view). IMPRESSION: No evidence of acute disease. Electronically Signed   By: Monte Fantasia M.D.   On: 10/16/2018 10:22    Scheduled Meds: . [START ON 10/19/2018] acetylcysteine  1,200 mg Oral BID  . amLODipine  10 mg Oral Daily  . aspirin EC  81 mg Oral Daily  . clopidogrel  75 mg Oral Daily  . [START ON 10/18/2018] gabapentin  300 mg Oral BID  . hydrALAZINE  25 mg Oral Q8H  . insulin aspart  0-15 Units Subcutaneous TID WC  . insulin aspart  0-5 Units Subcutaneous QHS  . insulin glargine  35 Units  Subcutaneous QHS  . metoprolol tartrate  100 mg Oral BID  . montelukast  10 mg Oral Daily  . pantoprazole  40 mg Oral Daily  . rosuvastatin  20 mg Oral q1800   Continuous Infusions: . sodium chloride 75 mL/hr at 10/17/18 1227  . [START ON 10/19/2018] sodium chloride    . heparin 1,150 Units/hr (10/16/18 2251)    Principal Problem:   NSTEMI (non-ST elevated myocardial infarction) ([place]) Active Problems:   CKD (chronic kidney disease) stage 3, GFR 30-59 ml/min (HCC)   Hypertensive crisis   Type 2 diabetes mellitus with stage 3 chronic kidney disease, with long-term current use of insulin ([place])   Noncompliance with medication regimen   Hyperlipidemia    Time spent: 67 minutes    [facility]  If 7PM-7AM, please contact night-coverage at [email or web address], password [email or web address] 10/17/2018, 2:36 PM  LOS: 0 days

## 2018-10-18 LAB — GLUCOSE, CAPILLARY
Glucose-Capillary: 39 mg/dL — CL (ref 70–99)
Glucose-Capillary: 99 mg/dL (ref 70–99)
Glucose-Capillary: 107 mg/dL — ABNORMAL HIGH (ref 70–99)
Glucose-Capillary: 94 mg/dL (ref 70–99)
Glucose-Capillary: 80 mg/dL (ref 70–99)
Glucose-Capillary: 101 mg/dL — ABNORMAL HIGH (ref 70–99)
Glucose-Capillary: 127 mg/dL — ABNORMAL HIGH (ref 70–99)

## 2018-10-18 LAB — CBC
HCT: 38.7 % — ABNORMAL LOW (ref 39.0–52.0)
Hemoglobin: 12.3 g/dL — ABNORMAL LOW (ref 13.0–17.0)
MCH: 27.3 pg (ref 26.0–34.0)
MCHC: 31.8 g/dL (ref 30.0–36.0)
MCV: 86 fL (ref 80.0–100.0)
Platelets: 216 10*3/uL (ref 150–400)
RBC: 4.5 MIL/uL (ref 4.22–5.81)
RDW: 13.6 % (ref 11.5–15.5)
WBC: 5.3 10*3/uL (ref 4.0–10.5)
nRBC: 0 % (ref 0.0–0.2)

## 2018-10-18 LAB — BASIC METABOLIC PANEL
Anion gap: 8 (ref 5–15)
BUN: 25 mg/dL — ABNORMAL HIGH (ref 8–23)
CO2: 25 mmol/L (ref 22–32)
Calcium: 8.4 mg/dL — ABNORMAL LOW (ref 8.9–10.3)
Chloride: 105 mmol/L (ref 98–111)
Creatinine, Ser: 2.37 mg/dL — ABNORMAL HIGH (ref 0.61–1.24)
GFR calc Af Amer: 31 mL/min — ABNORMAL LOW (ref >60)
GFR calc non Af Amer: 27 mL/min — ABNORMAL LOW (ref >60)
Glucose, Bld: 73 mg/dL (ref 70–99)
Potassium: 4.2 mmol/L (ref 3.5–5.1)
Sodium: 138 mmol/L (ref 135–145)

## 2018-10-18 LAB — PROTEIN / CREATININE RATIO, URINE
Creatinine, Urine: 44.8 mg/dL
Protein Creatinine Ratio: 10.33 mg/mg{creat} — ABNORMAL HIGH (ref 0.00–0.15)
Total Protein, Urine: 463 mg/dL

## 2018-10-18 LAB — HEPARIN LEVEL (UNFRACTIONATED): Heparin Unfractionated: 0.47 [IU]/mL (ref 0.30–0.70)

## 2018-10-18 MED ORDER — HYDRALAZINE HCL 50 MG PO TABS
50.0000 mg | ORAL_TABLET | Freq: Three times a day (TID) | ORAL | Status: DC
  Administered 2018-10-18 – 2018-10-19 (×3): 50 mg via ORAL
  Filled 2018-10-18 (×3): qty 1

## 2018-10-18 MED ORDER — INSULIN GLARGINE 100 UNIT/ML ~~LOC~~ SOLN
25.0000 [IU] | Freq: Every day | SUBCUTANEOUS | Status: DC
  Administered 2018-10-18: 25 [IU] via SUBCUTANEOUS
  Filled 2018-10-18: qty 0.25

## 2018-10-18 NOTE — Progress Notes (Signed)
Subjective:  Had low sugar overnight- "125 is too low for me" - crt 2.48 to 2.37- at least 900 of urine- BP has been HIGH!! - denies SOB or CP  Objective Vital signs in last 24 hours: Vitals:   10/17/18 1953 10/18/18 0456 10/18/18 0510 10/18/18 0948  BP: (!) 174/90 (!) 200/87 (!) 120/103 (!) 188/84  Pulse: 67 67  67  Resp: 15 19    Temp: 98.7 F (37.1 C) 98.9 F (37.2 C)    TempSrc: Oral Oral    SpO2: 99% 97%    Weight:  95.3 kg    Height:       Weight change: -2.631 kg  Intake/Output Summary (Last 24 hours) at 10/18/2018 0956 Last data filed at 10/18/2018 1856 Gross per 24 hour  Intake 840 ml  Output 900 ml  Net -60 ml    Assessment/Plan: # NSTEMI  - Currently cath is planned tentatively for Monday - Would hydrate 6-12 hours before and after contrast and can provide mucomyst x 4 doses (2 before and 2 after contrast)  # CKD stage III - Secondary to diabetic nephropathy and microvascular disease from HTN.  Note also prior NSAID use.  - Baseline Cr 1.8 -2.2 and he is currently slightly above his baseline  -  urine protein/creatinine ratio- is high at 10 grams  -  renal ultrasound- 11-12 cm kidneys- normal echogenicity - Hold valsartan and lasix for now -dont feel like need more hydration now, will plan for pericath only as above  -Discussed with patient the possibility of worsening renal function after heart catheterization however we also discussed the risks of possible worsening of atherosclerotic disease without intervention. -Given renal function, will need to reduce dose of gabapentin.  To start we will reduce to 300 mg twice daily  # HTN with CKD  - holding valsartan and lasix to optimize renal function for cath  - Start hydralazine - will increase dose today for high  BP- also still on norvasc 10    Louis Meckel    Labs: Basic Metabolic Panel: Recent Labs  Lab 10/16/18 0955 10/16/18 1034 10/17/18 0622 10/18/18 0342  NA 138 139 139 138  K 4.2  4.2 4.4 4.2  CL 107 108 108 105  CO2 22  --  25 25  GLUCOSE 172* 168* 125* 73  BUN 20 21 23  25*  CREATININE 2.34* 2.50* 2.48* 2.37*  CALCIUM 8.2*  --  7.7* 8.4*   Liver Function Tests: Recent Labs  Lab 10/16/18 0955  AST 16  ALT 11  ALKPHOS 116  BILITOT 0.7  PROT 6.0*  ALBUMIN 2.2*   No results for input(s): LIPASE, AMYLASE in the last 168 hours. No results for input(s): AMMONIA in the last 168 hours. CBC: Recent Labs  Lab 10/16/18 0955 10/16/18 1034 10/17/18 0622 10/18/18 0342  WBC 4.9  --  4.3 5.3  NEUTROABS 3.2  --   --   --   HGB 12.1* 12.2* 10.5* 12.3*  HCT 38.9* 36.0* 32.2* 38.7*  MCV 87.8  --  84.7 86.0  PLT 217  --  188 216   Cardiac Enzymes: Recent Labs  Lab 10/16/18 0955 10/16/18 1825 10/16/18 2136 10/17/18 0413  TROPONINI 0.04* 6.69* 4.73* 4.93*   CBG: Recent Labs  Lab 10/17/18 1652 10/17/18 2114 10/18/18 0324 10/18/18 0424 10/18/18 0756  GLUCAP 125* 103* 39* 99 107*    Iron Studies: No results for input(s): IRON, TIBC, TRANSFERRIN, FERRITIN in the last 72 hours. Studies/Results: US  Renal  Result Date: 10/17/2018 CLINICAL DATA:  67 year old male with chronic kidney disease EXAM: RENAL / URINARY TRACT ULTRASOUND COMPLETE COMPARISON:  None. FINDINGS: Right Kidney: Renal measurements: 12.0 x 6.1 x 7.3 centimeters = volume: 279 mL. Cortical echogenicity within normal limits. No right hydronephrosis. Simple appearing upper pole cyst measuring 18 millimeters. No solid right renal mass. Left Kidney: Renal measurements: 10.9 x 5.8 x 5.7 centimeters = volume: 188 mL. Echogenicity within normal limits. No mass or hydronephrosis visualized. Bladder: Appears normal for degree of bladder distention. Other findings: Prostate enlargement up to 6.4 centimeters diameter. IMPRESSION: Negative ultrasound appearance of both kidneys and the urinary bladder. Electronically Signed   By: Genevie Ann M.D.   On: 10/17/2018 14:22   Dg Chest Portable 1 View  Result Date:  10/16/2018 CLINICAL DATA:  Chest pain and hypertension EXAM: PORTABLE CHEST 1 VIEW COMPARISON:  06/06/2018 FINDINGS: Mild cardiomegaly. Negative aortic and hilar contours. There is no edema, consolidation, effusion, or pneumothorax. Chronic metallic foreign body along the left posterior back (based on prior lateral view). IMPRESSION: No evidence of acute disease. Electronically Signed   By: Monte Fantasia M.D.   On: 10/16/2018 10:22   Medications: Infusions: . [START ON 10/19/2018] sodium chloride    . heparin 1,150 Units/hr (10/17/18 1618)    Scheduled Medications: . [START ON 10/19/2018] acetylcysteine  1,200 mg Oral BID  . amLODipine  10 mg Oral Daily  . aspirin EC  81 mg Oral Daily  . clopidogrel  75 mg Oral Daily  . gabapentin  300 mg Oral BID  . hydrALAZINE  25 mg Oral Q8H  . insulin aspart  0-15 Units Subcutaneous TID WC  . insulin aspart  0-5 Units Subcutaneous QHS  . insulin glargine  35 Units Subcutaneous QHS  . metoprolol tartrate  100 mg Oral BID  . montelukast  10 mg Oral Daily  . pantoprazole  40 mg Oral Daily  . rosuvastatin  20 mg Oral q1800    have reviewed scheduled and prn medications.  Physical Exam: General: NAD Heart: RRR Lungs: dec BS at bases  Abdomen: distended, non tender Extremities: pitting edema     10/18/2018,9:56 AM  LOS: 1 day

## 2018-10-18 NOTE — Progress Notes (Signed)
CRITICAL VALUE ALERT  Critical Value: CBG 39  Date & Time Notied:  10/18/2018 @ 5973  Orders Received/Actions taken:OJ,peanut butter,gram crackers given. Recheck CBG 99

## 2018-10-18 NOTE — Progress Notes (Signed)
ANTICOAGULATION CONSULT NOTE   Pharmacy Consult for Heparin Indication: NSTEMI  No Known Allergies  Patient Measurements: Height: 6\' 3"  (190.5 cm) Weight: 210 lb 3.2 oz (95.3 kg) IBW/kg (Calculated) : 84.5 Heparin Dosing Weight: 98 kg  Vital Signs: Temp: 98.9 F (37.2 C) (12/21 0456) Temp Source: Oral (12/21 0456) BP: 188/84 (12/21 0948) Pulse Rate: 67 (12/21 0948)  Labs: Recent Labs    10/16/18 0955 10/16/18 1034 10/16/18 1825 10/16/18 2136 10/17/18 0413 10/17/18 0622 10/18/18 0342  HGB 12.1* 12.2*  --   --   --  10.5* 12.3*  HCT 38.9* 36.0*  --   --   --  32.2* 38.7*  PLT 217  --   --   --   --  188 216  HEPARINUNFRC  --   --   --   --   --  0.36 0.47  CREATININE 2.34* 2.50*  --   --   --  2.48* 2.37*  TROPONINI 0.04*  --  6.69* 4.73* 4.93*  --   --     Estimated Creatinine Clearance: 35.7 mL/min (A) (by C-G formula based on SCr of 2.37 mg/dL (H)).  Assessment: 67 y.o. M on heparin for NSTEMI. Noted plans for possible cath on Monday depending on renal function.  Heparin level therapeutic CBC stable  Goal of Therapy:  Heparin level 0.3-0.7 units/ml Monitor platelets by anticoagulation protocol: Yes   Plan:  Heparin drip at  1150 units/hr Daily heparin level and CBC  Thank you Anette Guarneri, PharmD [phone]  **Pharmacist phone directory can now be found on [email or web address] ([email or web address]).  Listed under [place]. 10/18/2018 11:02 AM

## 2018-10-18 NOTE — Progress Notes (Signed)
[facility] PROGRESS NOTE  [facility] OFB:[id number] DOB: 04/06/1951 DOA: 10/16/2018 PCP: Billie Ruddy, MD   Narrative: 67 year old African-American male Diabetes mellitus with complications of hypoglycemia Chronic kidney disease from nephropathy stage II-III HTN Right total knee replacement 2015, probable dementia Anemia, hyperlipidemia Body mass index is 26.27 kg/m.  Admission 1/22-1/23 with hypoglycemia, falls  Came to the emergency room 10/16/2017 with dizziness and burning in the chest-took nitroglycerin-note he had ran out of some of his blood pressure meds -Found to have hypertensive crisis placed on nitroglycerin drip   A & Plan NSTEMI-await drop in renal function-Cardiology tentatively cath 12/23?-cont Heparin Gtt Chronic kidney disease stage III secondary to diabetic nephropathy and microvascular disease-worsening of creat over 10/2017-02/2018 and progression of disease-nephrology recs holding lasix/ARB-Renal US normal--For Mucomyst and pre-cath hydration HTN-pretty elevated-use hydralazine for pressures ovwer 782 systolic-cont Amlodipine 10 daily, metoprolol 100 bid DM ty ii-cont cbg ssi--had a very low sugar this am--drop lantus to 25, d/c QHS sugar coverage Chronic memory loss Hyperlipidemia-cont Crestor Mild obesity  DVT prophylaxis: loveneox  Code Status: full   Family Communication: none    Disposition Plan: inpatient    Ama Mcmaster, MD  [facility] Direct contact: [phone] --Via amion app OR  --[email or web address]; password [email or web address]  7PM-7AM contact night coverage as above 10/18/2018, 8:00 AM  LOS: 1 day   Consultants:  neprho  cardiology  Procedures:  n  Antimicrobials:  n  Interval history/Subjective: Awake aelrt no cp no fever no chills no cough cold Walking around unit  Objective:  Vitals:  Vitals:   10/18/18 0456 10/18/18 0510  BP: (!) 200/87 (!) 120/103  Pulse: 67   Resp: 19   Temp: 98.9 F (37.2 C)   SpO2:  97%     Exam:   eomi ncat  s1 s 2no m cta b Neuro intact Smile symm   I have personally reviewed the following:  DATA   Labs:  Bun/creat 25/2.3 down forom 23/2.4   +1.43 liters  Scheduled Meds: . [START ON 10/19/2018] acetylcysteine  1,200 mg Oral BID  . amLODipine  10 mg Oral Daily  . aspirin EC  81 mg Oral Daily  . clopidogrel  75 mg Oral Daily  . gabapentin  300 mg Oral BID  . hydrALAZINE  50 mg Oral Q8H  . insulin aspart  0-15 Units Subcutaneous TID WC  . insulin glargine  25 Units Subcutaneous QHS  . metoprolol tartrate  100 mg Oral BID  . montelukast  10 mg Oral Daily  . pantoprazole  40 mg Oral Daily  . rosuvastatin  20 mg Oral q1800   Continuous Infusions: . [START ON 10/19/2018] sodium chloride    . heparin 1,150 Units/hr (10/17/18 1618)    Principal Problem:   NSTEMI (non-ST elevated myocardial infarction) ([place]) Active Problems:   CKD (chronic kidney disease) stage 3, GFR 30-59 ml/min (HCC)   Type 2 diabetes mellitus with stage 3 chronic kidney disease, with long-term current use of insulin ([place])   Hyperlipidemia   Noncompliance with medication regimen   Hypertensive crisis   LOS: 1 day

## 2018-10-18 NOTE — Progress Notes (Signed)
Patient had 3 beats Vtach at [employer].  Patient asymptomatic.  Strip saved to [employer].

## 2018-10-19 LAB — BASIC METABOLIC PANEL
Anion gap: 6 (ref 5–15)
BUN: 25 mg/dL — ABNORMAL HIGH (ref 8–23)
CO2: 26 mmol/L (ref 22–32)
Calcium: 8.3 mg/dL — ABNORMAL LOW (ref 8.9–10.3)
Chloride: 107 mmol/L (ref 98–111)
Creatinine, Ser: 2.55 mg/dL — ABNORMAL HIGH (ref 0.61–1.24)
GFR calc Af Amer: 29 mL/min — ABNORMAL LOW (ref >60)
GFR calc non Af Amer: 25 mL/min — ABNORMAL LOW (ref >60)
Glucose, Bld: 84 mg/dL (ref 70–99)
Potassium: 4.5 mmol/L (ref 3.5–5.1)
Sodium: 139 mmol/L (ref 135–145)

## 2018-10-19 LAB — CBC
HCT: 33.4 % — ABNORMAL LOW (ref 39.0–52.0)
Hemoglobin: 10.7 g/dL — ABNORMAL LOW (ref 13.0–17.0)
MCH: 27.4 pg (ref 26.0–34.0)
MCHC: 32 g/dL (ref 30.0–36.0)
MCV: 85.6 fL (ref 80.0–100.0)
Platelets: 198 10*3/uL (ref 150–400)
RBC: 3.9 MIL/uL — ABNORMAL LOW (ref 4.22–5.81)
RDW: 13.8 % (ref 11.5–15.5)
WBC: 4.2 10*3/uL (ref 4.0–10.5)
nRBC: 0 % (ref 0.0–0.2)

## 2018-10-19 LAB — CREATININE, SERUM
Creatinine, Ser: 2.57 mg/dL — ABNORMAL HIGH (ref 0.61–1.24)
GFR calc Af Amer: 29 mL/min — ABNORMAL LOW (ref >60)
GFR calc non Af Amer: 25 mL/min — ABNORMAL LOW (ref >60)

## 2018-10-19 LAB — GLUCOSE, CAPILLARY
Glucose-Capillary: 99 mg/dL (ref 70–99)
Glucose-Capillary: 142 mg/dL — ABNORMAL HIGH (ref 70–99)
Glucose-Capillary: 140 mg/dL — ABNORMAL HIGH (ref 70–99)
Glucose-Capillary: 103 mg/dL — ABNORMAL HIGH (ref 70–99)
Glucose-Capillary: 137 mg/dL — ABNORMAL HIGH (ref 70–99)
Glucose-Capillary: 161 mg/dL — ABNORMAL HIGH (ref 70–99)

## 2018-10-19 LAB — HEPARIN LEVEL (UNFRACTIONATED)
Heparin Unfractionated: 0.3 IU/mL (ref 0.30–0.70)
Heparin Unfractionated: 0.36 IU/mL (ref 0.30–0.70)

## 2018-10-19 MED ORDER — ACETYLCYSTEINE 20 % IN SOLN
1200.0000 mg | Freq: Two times a day (BID) | RESPIRATORY_TRACT | Status: DC
  Administered 2018-10-19 – 2018-10-20 (×2): 1200 mg via ORAL
  Filled 2018-10-19 (×5): qty 8

## 2018-10-19 MED ORDER — INSULIN GLARGINE 100 UNIT/ML ~~LOC~~ SOLN: 20.0000 [IU] | Freq: Every day | SUBCUTANEOUS | Status: DC

## 2018-10-19 MED ORDER — INSULIN ASPART 100 UNIT/ML ~~LOC~~ SOLN: 0.0000 [IU] | Freq: Three times a day (TID) | SUBCUTANEOUS | Status: DC

## 2018-10-19 MED ORDER — INSULIN ASPART 100 UNIT/ML ~~LOC~~ SOLN: 3.0000 [IU] | Freq: Three times a day (TID) | SUBCUTANEOUS | Status: DC

## 2018-10-19 MED ORDER — INSULIN GLARGINE 100 UNIT/ML ~~LOC~~ SOLN
20.0000 [IU] | Freq: Every day | SUBCUTANEOUS | Status: DC
  Filled 2018-10-19: qty 0.2

## 2018-10-19 MED ORDER — HYDRALAZINE HCL 50 MG PO TABS
100.0000 mg | ORAL_TABLET | Freq: Three times a day (TID) | ORAL | Status: DC
  Administered 2018-10-19 – 2018-10-21 (×6): 100 mg via ORAL
  Filled 2018-10-19 (×6): qty 2

## 2018-10-19 MED ORDER — ISOSORBIDE MONONITRATE ER 30 MG PO TB24
15.0000 mg | ORAL_TABLET | Freq: Every day | ORAL | Status: DC
  Administered 2018-10-19 – 2018-10-21 (×3): 15 mg via ORAL
  Filled 2018-10-19 (×3): qty 1

## 2018-10-19 MED ORDER — INSULIN GLARGINE 100 UNIT/ML ~~LOC~~ SOLN
10.0000 [IU] | Freq: Once | SUBCUTANEOUS | Status: AC
  Administered 2018-10-19: 10 [IU] via SUBCUTANEOUS
  Filled 2018-10-19: qty 0.1

## 2018-10-19 NOTE — Progress Notes (Signed)
ANTICOAGULATION CONSULT NOTE   Pharmacy Consult for Heparin Indication: NSTEMI  No Known Allergies  Patient Measurements: Height: 6\' 3"  (190.5 cm) Weight: 210 lb 12.2 oz (95.6 kg) IBW/kg (Calculated) : 84.5 Heparin Dosing Weight: 98 kg  Vital Signs: Temp: 98 F (36.7 C) (12/22 1136) Temp Source: Oral (12/22 1136) BP: 155/66 (12/22 1450) Pulse Rate: 67 (12/22 1136)  Labs: Recent Labs    10/16/18 1825 10/16/18 2136 10/17/18 0413  10/17/18 0622 10/18/18 0342 10/19/18 0620 10/19/18 1516  HGB  --   --   --    < > 10.5* 12.3* 10.7*  --   HCT  --   --   --   --  32.2* 38.7* 33.4*  --   PLT  --   --   --   --  188 216 198  --   HEPARINUNFRC  --   --   --    < > 0.36 0.47 0.30 0.36  CREATININE  --   --   --   --  2.48* 2.37* 2.55*  --   TROPONINI 6.69* 4.73* 4.93*  --   --   --   --   --    < > = values in this interval not displayed.    Estimated Creatinine Clearance: 33.1 mL/min (A) (by C-G formula based on SCr of 2.55 mg/dL (H)).  Assessment: 67 y.o. M on heparin for NSTEMI. Noted plans for possible cath on Monday depending on renal function.  Heparin level remains therapeutic (0.36) on gtt at 1250 units/hr. Hgb 10.7, pltc 198   Goal of Therapy:  Heparin level 0.3-0.7 units/ml Monitor platelets by anticoagulation protocol: Yes   Plan:  Continue Heparin drip at 1250 units/hr Daily heparin level, s/sx of bleeding, and CBC   Thank you for involving pharmacy in this patient's care.  Sherlon Handing, PharmD, BCPS Clinical pharmacist  **Pharmacist phone directory can now be found on [email or web address] ([email or web address]).  Listed under [place]. 10/19/2018 3:59 PM

## 2018-10-19 NOTE — Progress Notes (Signed)
ANTICOAGULATION CONSULT NOTE   Pharmacy Consult for Heparin Indication: NSTEMI  No Known Allergies  Patient Measurements: Height: 6\' 3"  (190.5 cm) Weight: 210 lb 12.2 oz (95.6 kg) IBW/kg (Calculated) : 84.5 Heparin Dosing Weight: 98 kg  Vital Signs: Temp: 98.6 F (37 C) (12/22 0509) Temp Source: Oral (12/22 0509) BP: 189/91 (12/22 0509) Pulse Rate: 66 (12/22 0509)  Labs: Recent Labs    10/16/18 1825 10/16/18 2136 10/17/18 0413 10/17/18 0622 10/18/18 0342 10/19/18 0620  HGB  --   --   --  10.5* 12.3* 10.7*  HCT  --   --   --  32.2* 38.7* 33.4*  PLT  --   --   --  188 216 198  HEPARINUNFRC  --   --   --  0.36 0.47 0.30  CREATININE  --   --   --  2.48* 2.37* 2.55*  TROPONINI 6.69* 4.73* 4.93*  --   --   --     Estimated Creatinine Clearance: 33.1 mL/min (A) (by C-G formula based on SCr of 2.55 mg/dL (H)).  Assessment: 67 y.o. M on heparin for NSTEMI. Noted plans for possible cath on Monday depending on renal function.  12/22: Heparin level this morning 0.30, at lower end of therapeutic and seemingly trending down. Per RN no infusion issues or bleeding noted. Hgb 10.7, pltc 198   Goal of Therapy:  Heparin level 0.3-0.7 units/ml Monitor platelets by anticoagulation protocol: Yes   Plan:  Increase Heparin drip to 1250 units/hr Heparin level at 1500 Daily heparin level, s/sx of bleeding, and CBC   Thank you for involving pharmacy in this patient's care.  Janae Bridgeman, PharmD PGY1 Pharmacy Resident Phone: [phone] 10/19/2018 8:00 AM

## 2018-10-19 NOTE — Progress Notes (Signed)
[facility] PROGRESS NOTE  William Webb [facility] DEY:[id number] DOB: 1951-01-16 DOA: 10/16/2018 PCP: Billie Ruddy, MD   Narrative: 67 year old African-American male Diabetes mellitus with complications of hypoglycemia Chronic kidney disease from nephropathy stage II-III HTN Right total knee replacement 2015, probable dementia Anemia, hyperlipidemia Body mass index is 26.34 kg/m.  Admission 1/22-1/23 with hypoglycemia, falls  Came to the emergency room 10/16/2017 with dizziness and burning in the chest-took nitroglycerin-note he had ran out of some of his blood pressure meds -Found to have hypertensive crisis placed on nitroglycerin drip   A & Plan NSTEMI-await drop in renal function-Cardiology tentatively cath 12/23?-cont Heparin Gtt and monitor on telemetry Chronic kidney disease stage III secondary to diabetic nephropathy and microvascular disease-worsening of creat over 10/2017-02/2018 and progression of disease-nephrology recs holding lasix/ARB-Renal US normal--For Mucomyst and pre-cath hydration-creatinine is reasonably stabilized at this time HTN-pretty elevated-use hydralazine for pressures over 314 systolic-cont Amlodipine 10 daily, metoprolol 100 bid--adding to medications small doses of Imdur 15 mg DM ty ii-cont cbg ssi--sugars still slightly low early in the morning therefore cut back Lantus from 25-20 Chronic memory loss Hyperlipidemia-cont Crestor Mild obesity  DVT prophylaxis: loveneox  Code Status: full   Family Communication: none    Disposition Plan: inpatient    Yiannis Tulloch, MD  [facility] Direct contact: [phone] --Via amion app OR  --[email or web address]; password [email or web address]  7PM-7AM contact night coverage as above 10/19/2018, 8:42 AM  LOS: 2 days   Consultants:  neprho  cardiology  Procedures:  n  Antimicrobials:  n  Interval history/Subjective:  Awake alert well no complaints no shortness of breath passing good urine sitting up eating  breakfast  Objective:  Vitals:  Vitals:   10/18/18 1954 10/19/18 0509  BP: (!) 189/85 (!) 189/91  Pulse: 67 66  Resp: 20   Temp: 98.4 F (36.9 C) 98.6 F (37 C)  SpO2: 99% 99%    Exam:  No discernible change from prior   eomi ncat  s1 s 2no m cta b Neuro intact Smile symm   I have personally reviewed the following:  DATA   Labs:  Bun/creat 25/2.3 down forom 23/2.4-->BUN/creatinine 25/2.5 hemoglobin 10.7 WBC 4.2  +2.0 L  Blood sugar ranges 84-1 42  Scheduled Meds: . acetylcysteine  1,200 mg Oral BID  . amLODipine  10 mg Oral Daily  . aspirin EC  81 mg Oral Daily  . clopidogrel  75 mg Oral Daily  . gabapentin  300 mg Oral BID  . hydrALAZINE  50 mg Oral Q8H  . insulin aspart  0-15 Units Subcutaneous TID WC  . insulin glargine  20 Units Subcutaneous QHS  . metoprolol tartrate  100 mg Oral BID  . montelukast  10 mg Oral Daily  . pantoprazole  40 mg Oral Daily  . rosuvastatin  20 mg Oral q1800   Continuous Infusions: . sodium chloride    . heparin 1,250 Units/hr (10/19/18 0820)    Principal Problem:   NSTEMI (non-ST elevated myocardial infarction) ([place]) Active Problems:   CKD (chronic kidney disease) stage 3, GFR 30-59 ml/min (HCC)   Type 2 diabetes mellitus with stage 3 chronic kidney disease, with long-term current use of insulin ([place])   Hyperlipidemia   Noncompliance with medication regimen   Hypertensive crisis   LOS: 2 days

## 2018-10-19 NOTE — Progress Notes (Signed)
Subjective:   - crt 2.37 to 2.55- 1650 of urine- BP has still  been HIGH!! - denies SOB or CP.  Pt VERY concerned about his sugar being too low  Objective Vital signs in last 24 hours: Vitals:   10/18/18 1457 10/18/18 1954 10/19/18 0500 10/19/18 0509  BP: (!) 188/97 (!) 189/85  (!) 189/91  Pulse:  67  66  Resp:  20    Temp:  98.4 F (36.9 C)  98.6 F (37 C)  TempSrc:  Oral  Oral  SpO2:  99%  99%  Weight:   95.6 kg   Height:       Weight change: 0.254 kg  Intake/Output Summary (Last 24 hours) at 10/19/2018 0957 Last data filed at 10/19/2018 0548 Gross per 24 hour  Intake 1798 ml  Output 1650 ml  Net 148 ml    Assessment/Plan: # NSTEMI  - Currently cath is planned tentatively for Monday - Would hydrate 6-12 hours before and after contrast and can provide mucomyst x 4 doses (2 before and 2 after contrast)  # CKD stage III - Secondary to diabetic nephropathy and microvascular disease from HTN.  Note also prior NSAID use.  - Baseline Cr 1.8 -2.2 and he is currently slightly above his baseline  -  urine protein/creatinine ratio- is high at 10 grams  -  renal ultrasound- 11-12 cm kidneys- normal echogenicity - Hold valsartan and lasix for now -dont feel like need more hydration now, will plan for pericath only as above  -Discussed with patient the possibility of worsening renal function after heart catheterization however we also discussed the risks of possible worsening of atherosclerotic disease without intervention. -Given renal function, will need to reduce dose of gabapentin.  To start we will reduce to 300 mg twice daily  # HTN with CKD  - holding valsartan and lasix to optimize renal function for cath  - Start hydralazine - will increase dose again today for high  BP- also still on norvasc 10 and lopressor 100 BID.  After procedure will need diuresis as I feel this will be the key to getting BP down  DM Have informed Primary of pt and family concern over  sugar    [place]: Basic Metabolic Panel: Recent Labs  Lab 10/17/18 0622 10/18/18 0342 10/19/18 0620  NA 139 138 139  K 4.4 4.2 4.5  CL 108 105 107  CO2 25 25 26   GLUCOSE 125* 73 84  BUN 23 25* 25*  CREATININE 2.48* 2.37* 2.55*  CALCIUM 7.7* 8.4* 8.3*   Liver Function Tests: Recent Labs  Lab 10/16/18 0955  AST 16  ALT 11  ALKPHOS 116  BILITOT 0.7  PROT 6.0*  ALBUMIN 2.2*   No results for input(s): LIPASE, AMYLASE in the last 168 hours. No results for input(s): AMMONIA in the last 168 hours. CBC: Recent Labs  Lab 10/16/18 0955  10/17/18 0622 10/18/18 0342 10/19/18 0620  WBC 4.9  --  4.3 5.3 4.2  NEUTROABS 3.2  --   --   --   --   HGB 12.1*   < > 10.5* 12.3* 10.7*  HCT 38.9*   < > 32.2* 38.7* 33.4*  MCV 87.8  --  84.7 86.0 85.6  PLT 217  --  188 216 198   < > = values in this interval not displayed.   Cardiac Enzymes: Recent Labs  Lab 10/16/18 0955 10/16/18 1825 10/16/18 2136 10/17/18 0413  TROPONINI 0.04*  6.69* 4.73* 4.93*   CBG: Recent Labs  Lab 10/18/18 2031 10/18/18 2312 10/19/18 0147 10/19/18 0407 10/19/18 0812  GLUCAP 101* 127* 99 142* 140*    Iron Studies: No results for input(s): IRON, TIBC, TRANSFERRIN, FERRITIN in the last 72 hours. Studies/Results: US Renal  Result Date: 10/17/2018 CLINICAL DATA:  67 year old male with chronic kidney disease EXAM: RENAL / URINARY TRACT ULTRASOUND COMPLETE COMPARISON:  None. FINDINGS: Right Kidney: Renal measurements: 12.0 x 6.1 x 7.3 centimeters = volume: 279 mL. Cortical echogenicity within normal limits. No right hydronephrosis. Simple appearing upper pole cyst measuring 18 millimeters. No solid right renal mass. Left Kidney: Renal measurements: 10.9 x 5.8 x 5.7 centimeters = volume: 188 mL. Echogenicity within normal limits. No mass or hydronephrosis visualized. Bladder: Appears normal for degree of bladder distention. Other findings: Prostate enlargement up to 6.4  centimeters diameter. IMPRESSION: Negative ultrasound appearance of both kidneys and the urinary bladder. Electronically Signed   By: Genevie Ann M.D.   On: 10/17/2018 14:22   Medications: Infusions: . sodium chloride    . heparin 1,250 Units/hr (10/19/18 0820)    Scheduled Medications: . acetylcysteine  1,200 mg Oral BID  . amLODipine  10 mg Oral Daily  . aspirin EC  81 mg Oral Daily  . clopidogrel  75 mg Oral Daily  . gabapentin  300 mg Oral BID  . hydrALAZINE  50 mg Oral Q8H  . insulin aspart  0-15 Units Subcutaneous TID WC  . insulin glargine  20 Units Subcutaneous QHS  . isosorbide mononitrate  15 mg Oral Daily  . metoprolol tartrate  100 mg Oral BID  . montelukast  10 mg Oral Daily  . pantoprazole  40 mg Oral Daily  . rosuvastatin  20 mg Oral q1800    have reviewed scheduled and prn medications.  Physical Exam: General: NAD Heart: RRR Lungs: dec BS at bases  Abdomen: distended, non tender Extremities: pitting edema     10/19/2018,9:57 AM  LOS: 2 days

## 2018-10-20 ENCOUNTER — Encounter (HOSPITAL_COMMUNITY)
Admission: EM | Disposition: A | Discharge status: Home / Self Care | Payer: Self-pay | Source: Home / Self Care | Attending: Family Medicine

## 2018-10-20 ENCOUNTER — Other Ambulatory Visit: Payer: Self-pay

## 2018-10-20 ENCOUNTER — Encounter (HOSPITAL_COMMUNITY): Payer: Self-pay | Admitting: General Practice

## 2018-10-20 DIAGNOSIS — Z794 Long term (current) use of insulin: Secondary | ICD-10-CM

## 2018-10-20 DIAGNOSIS — I214 Non-ST elevation (NSTEMI) myocardial infarction: Principal | ICD-10-CM

## 2018-10-20 DIAGNOSIS — E1122 Type 2 diabetes mellitus with diabetic chronic kidney disease: Secondary | ICD-10-CM

## 2018-10-20 DIAGNOSIS — N183 Chronic kidney disease, stage 3 (moderate): Secondary | ICD-10-CM

## 2018-10-20 HISTORY — PX: CARDIAC CATHETERIZATION: SHX172

## 2018-10-20 HISTORY — PX: LEFT HEART CATH AND CORONARY ANGIOGRAPHY: CATH118249

## 2018-10-20 HISTORY — PX: CORONARY BALLOON ANGIOPLASTY: CATH118233

## 2018-10-20 LAB — CBC WITH DIFFERENTIAL/PLATELET
Abs Immature Granulocytes: 0.01 10*3/uL (ref 0.00–0.07)
Basophils Absolute: 0 10*3/uL (ref 0.0–0.1)
Basophils Relative: 1 %
Eosinophils Absolute: 0.2 10*3/uL (ref 0.0–0.5)
Eosinophils Relative: 4 %
HCT: 32.4 % — ABNORMAL LOW (ref 39.0–52.0)
Hemoglobin: 10.6 g/dL — ABNORMAL LOW (ref 13.0–17.0)
Immature Granulocytes: 0 %
Lymphocytes Relative: 26 %
Lymphs Abs: 1.2 10*3/uL (ref 0.7–4.0)
MCH: 27.8 pg (ref 26.0–34.0)
MCHC: 32.7 g/dL (ref 30.0–36.0)
MCV: 85 fL (ref 80.0–100.0)
Monocytes Absolute: 0.5 10*3/uL (ref 0.1–1.0)
Monocytes Relative: 11 %
Neutro Abs: 2.8 10*3/uL (ref 1.7–7.7)
Neutrophils Relative %: 58 %
Platelets: 198 10*3/uL (ref 150–400)
RBC: 3.81 MIL/uL — ABNORMAL LOW (ref 4.22–5.81)
RDW: 13.9 % (ref 11.5–15.5)
WBC: 4.8 10*3/uL (ref 4.0–10.5)
nRBC: 0 % (ref 0.0–0.2)

## 2018-10-20 LAB — GLUCOSE, CAPILLARY
Glucose-Capillary: 105 mg/dL — ABNORMAL HIGH (ref 70–99)
Glucose-Capillary: 117 mg/dL — ABNORMAL HIGH (ref 70–99)
Glucose-Capillary: 118 mg/dL — ABNORMAL HIGH (ref 70–99)
Glucose-Capillary: 122 mg/dL — ABNORMAL HIGH (ref 70–99)
Glucose-Capillary: 140 mg/dL — ABNORMAL HIGH (ref 70–99)
Glucose-Capillary: 142 mg/dL — ABNORMAL HIGH (ref 70–99)
Glucose-Capillary: 46 mg/dL — ABNORMAL LOW (ref 70–99)
Glucose-Capillary: 63 mg/dL — ABNORMAL LOW (ref 70–99)
Glucose-Capillary: 71 mg/dL (ref 70–99)
Glucose-Capillary: 81 mg/dL (ref 70–99)
Glucose-Capillary: 82 mg/dL (ref 70–99)
Glucose-Capillary: 83 mg/dL (ref 70–99)
Glucose-Capillary: 83 mg/dL (ref 70–99)
Glucose-Capillary: 84 mg/dL (ref 70–99)
Glucose-Capillary: 86 mg/dL (ref 70–99)
Glucose-Capillary: 90 mg/dL (ref 70–99)

## 2018-10-20 LAB — BASIC METABOLIC PANEL
Anion gap: 8 (ref 5–15)
BUN: 26 mg/dL — ABNORMAL HIGH (ref 8–23)
CO2: 23 mmol/L (ref 22–32)
Calcium: 8 mg/dL — ABNORMAL LOW (ref 8.9–10.3)
Chloride: 108 mmol/L (ref 98–111)
Creatinine, Ser: 2.43 mg/dL — ABNORMAL HIGH (ref 0.61–1.24)
GFR calc Af Amer: 31 mL/min — ABNORMAL LOW (ref >60)
GFR calc non Af Amer: 26 mL/min — ABNORMAL LOW (ref >60)
Glucose, Bld: 53 mg/dL — ABNORMAL LOW (ref 70–99)
Potassium: 4.3 mmol/L (ref 3.5–5.1)
Sodium: 139 mmol/L (ref 135–145)

## 2018-10-20 LAB — POCT ACTIVATED CLOTTING TIME
Activated Clotting Time: 252 seconds
Activated Clotting Time: 301 seconds
Activated Clotting Time: 235 seconds
Activated Clotting Time: 467 seconds
Activated Clotting Time: 175 seconds

## 2018-10-20 LAB — HEPARIN LEVEL (UNFRACTIONATED): Heparin Unfractionated: 0.39 IU/mL (ref 0.30–0.70)

## 2018-10-20 SURGERY — LEFT HEART CATH AND CORONARY ANGIOGRAPHY
Anesthesia: LOCAL

## 2018-10-20 MED ORDER — DEXTROSE 50 % IV SOLN
12.5000 g | INTRAVENOUS | Status: AC
  Administered 2018-10-20: 12.5 g via INTRAVENOUS

## 2018-10-20 MED ORDER — HEPARIN SODIUM (PORCINE) 1000 UNIT/ML IJ SOLN
INTRAMUSCULAR | Status: AC
  Filled 2018-10-20: qty 1

## 2018-10-20 MED ORDER — SODIUM CHLORIDE 0.9% FLUSH
3.0000 mL | Freq: Two times a day (BID) | INTRAVENOUS | Status: DC
  Administered 2018-10-20: 3 mL via INTRAVENOUS

## 2018-10-20 MED ORDER — SODIUM CHLORIDE 0.9 % IV SOLN: 250.0000 mL | INTRAVENOUS | Status: DC | PRN

## 2018-10-20 MED ORDER — NITROGLYCERIN 1 MG/10 ML FOR IR/CATH LAB
INTRA_ARTERIAL | Status: DC | PRN
  Administered 2018-10-20: 200 ug via INTRACORONARY

## 2018-10-20 MED ORDER — ONDANSETRON HCL 4 MG/2ML IJ SOLN: 4.0000 mg | Freq: Four times a day (QID) | INTRAMUSCULAR | Status: DC | PRN

## 2018-10-20 MED ORDER — TICAGRELOR 90 MG PO TABS
180.0000 mg | ORAL_TABLET | Freq: Once | ORAL | Status: AC
  Administered 2018-10-20: 180 mg via ORAL
  Filled 2018-10-20: qty 2

## 2018-10-20 MED ORDER — DEXTROSE 50 % IV SOLN: 12.5000 g | INTRAVENOUS | Status: AC

## 2018-10-20 MED ORDER — NITROGLYCERIN 1 MG/10 ML FOR IR/CATH LAB
INTRA_ARTERIAL | Status: AC
  Filled 2018-10-20: qty 10

## 2018-10-20 MED ORDER — FENTANYL CITRATE (PF) 100 MCG/2ML IJ SOLN
INTRAMUSCULAR | Status: AC
  Filled 2018-10-20: qty 2

## 2018-10-20 MED ORDER — ACETYLCYSTEINE 20 % IN SOLN
6.0000 mL | Freq: Two times a day (BID) | RESPIRATORY_TRACT | Status: AC
  Administered 2018-10-20 – 2018-10-21 (×2): 6 mL via ORAL
  Filled 2018-10-20 (×3): qty 8

## 2018-10-20 MED ORDER — SODIUM CHLORIDE 0.9% FLUSH
3.0000 mL | INTRAVENOUS | Status: DC | PRN
  Administered 2018-10-20: 11:00:00 3 mL via INTRAVENOUS
  Filled 2018-10-20: qty 3

## 2018-10-20 MED ORDER — LIDOCAINE HCL (PF) 1 % IJ SOLN
INTRAMUSCULAR | Status: DC | PRN
  Administered 2018-10-20: 15 mL via INTRADERMAL

## 2018-10-20 MED ORDER — TICAGRELOR 90 MG PO TABS
90.0000 mg | ORAL_TABLET | Freq: Two times a day (BID) | ORAL | Status: DC
  Administered 2018-10-21: 90 mg via ORAL
  Filled 2018-10-20: qty 1

## 2018-10-20 MED ORDER — MIDAZOLAM HCL 2 MG/2ML IJ SOLN
INTRAMUSCULAR | Status: AC
  Filled 2018-10-20: qty 2

## 2018-10-20 MED ORDER — SODIUM CHLORIDE 0.9 % IV SOLN
INTRAVENOUS | Status: DC
  Administered 2018-10-20: 02:00:00 via INTRAVENOUS

## 2018-10-20 MED ORDER — HEPARIN SODIUM (PORCINE) 1000 UNIT/ML IJ SOLN
INTRAMUSCULAR | Status: DC | PRN
  Administered 2018-10-20: 2000 [IU] via INTRAVENOUS
  Administered 2018-10-20: 8000 [IU] via INTRAVENOUS
  Administered 2018-10-20: 1000 [IU] via INTRAVENOUS

## 2018-10-20 MED ORDER — MIDAZOLAM HCL 2 MG/2ML IJ SOLN
INTRAMUSCULAR | Status: DC | PRN
  Administered 2018-10-20 (×2): 1 mg via INTRAVENOUS

## 2018-10-20 MED ORDER — ANGIOPLASTY BOOK
Freq: Once | Status: AC
  Administered 2018-10-21: 1
  Filled 2018-10-20: qty 1

## 2018-10-20 MED ORDER — FENTANYL CITRATE (PF) 100 MCG/2ML IJ SOLN
INTRAMUSCULAR | Status: DC | PRN
  Administered 2018-10-20: 50 ug via INTRAVENOUS

## 2018-10-20 MED ORDER — DEXTROSE 50 % IV SOLN
INTRAVENOUS | Status: AC
  Filled 2018-10-20: qty 50

## 2018-10-20 MED ORDER — LABETALOL HCL 5 MG/ML IV SOLN: 10.0000 mg | INTRAVENOUS | Status: AC | PRN

## 2018-10-20 MED ORDER — DEXTROSE 10 % IV SOLN
INTRAVENOUS | Status: DC
  Administered 2018-10-20: 07:00:00 via INTRAVENOUS

## 2018-10-20 MED ORDER — SODIUM CHLORIDE 0.9% FLUSH: 3.0000 mL | INTRAVENOUS | Status: DC | PRN

## 2018-10-20 MED ORDER — DEXTROSE 50 % IV SOLN
INTRAVENOUS | Status: AC
  Administered 2018-10-20: 50 mL
  Filled 2018-10-20: qty 50

## 2018-10-20 MED ORDER — SODIUM CHLORIDE 0.9 % IV SOLN: INTRAVENOUS | Status: AC

## 2018-10-20 MED ORDER — HEPARIN (PORCINE) IN NACL 1000-0.9 UT/500ML-% IV SOLN
INTRAVENOUS | Status: AC
  Filled 2018-10-20: qty 1500

## 2018-10-20 MED ORDER — HYDRALAZINE HCL 20 MG/ML IJ SOLN
5.0000 mg | INTRAMUSCULAR | Status: AC | PRN
  Filled 2018-10-20: qty 1

## 2018-10-20 MED ORDER — ACETAMINOPHEN 325 MG PO TABS: 650.0000 mg | ORAL_TABLET | ORAL | Status: DC | PRN

## 2018-10-20 MED ORDER — IOHEXOL 350 MG/ML SOLN
INTRAVENOUS | Status: DC | PRN
  Administered 2018-10-20: 105 mL via INTRACARDIAC

## 2018-10-20 MED ORDER — ASPIRIN 81 MG PO CHEW
81.0000 mg | CHEWABLE_TABLET | ORAL | Status: AC
  Administered 2018-10-20: 81 mg via ORAL
  Filled 2018-10-20: qty 1

## 2018-10-20 MED ORDER — LIDOCAINE HCL (PF) 1 % IJ SOLN
INTRAMUSCULAR | Status: AC
  Filled 2018-10-20: qty 30

## 2018-10-20 MED ORDER — HEPARIN (PORCINE) IN NACL 1000-0.9 UT/500ML-% IV SOLN
INTRAVENOUS | Status: DC | PRN
  Administered 2018-10-20 (×3): 500 mL

## 2018-10-20 MED ORDER — HEART ATTACK BOUNCING BOOK
Freq: Once | Status: AC
  Administered 2018-10-21: 06:00:00 1
  Filled 2018-10-20: qty 1

## 2018-10-20 SURGICAL SUPPLY — 21 items
BALLN SAPPHIRE 1.5X15 (BALLOONS) ×2
BALLN SAPPHIRE 2.5X15 (BALLOONS) ×2
BALLOON SAPPHIRE 1.5X15 (BALLOONS) ×1 IMPLANT
BALLOON SAPPHIRE 2.5X15 (BALLOONS) ×1 IMPLANT
CATH INFINITI 5FR MULTPACK ANG (CATHETERS) ×2 IMPLANT
CATH LAUNCHER 6FR JR4 (CATHETERS) ×2 IMPLANT
DEVICE CONTINUOUS FLUSH (MISCELLANEOUS) ×2 IMPLANT
KIT ENCORE 26 ADVANTAGE (KITS) ×2 IMPLANT
KIT HEART LEFT (KITS) ×2 IMPLANT
KIT HEMO VALVE WATCHDOG (MISCELLANEOUS) ×2 IMPLANT
KIT MICROPUNCTURE NIT STIFF (SHEATH) ×2 IMPLANT
PACK CARDIAC CATHETERIZATION (CUSTOM PROCEDURE TRAY) ×2 IMPLANT
SHEATH PINNACLE 5F 10CM (SHEATH) ×2 IMPLANT
SHEATH PINNACLE 6F 10CM (SHEATH) ×2 IMPLANT
SHEATH PROBE COVER 6X72 (BAG) ×2 IMPLANT
TRANSDUCER W/STOPCOCK (MISCELLANEOUS) ×2 IMPLANT
TUBING CIL FLEX 10 FLL-RA (TUBING) ×2 IMPLANT
WIRE ASAHI MIRACLEBROS-3 180CM (WIRE) ×2 IMPLANT
WIRE ASAHI MIRACLEBROS-6 180CM (WIRE) ×2 IMPLANT
WIRE ASAHI PROWATER 180CM (WIRE) ×4 IMPLANT
WIRE EMERALD 3MM-J .035X150CM (WIRE) ×2 IMPLANT

## 2018-10-20 NOTE — Progress Notes (Signed)
[facility] PROGRESS NOTE  [facility] GGE:[id number] DOB: December 27, 1950 DOA: 10/16/2018  PCP: Billie Ruddy, MD  Brief History/Interval Summary: 67 year old African-American male with a past medical history of insulin-dependent diabetes with episodes of hypoglycemia, chronic kidney disease stage III, essential hypertension, presented with chest pain.  Hospitalized for further management.   Reason for Visit: Chest pain, NSTEMI  Consultants: Cardiology.  Nephrology.  Procedures:   Transthoracic echocardiogram Study Conclusions  - Left ventricle: The cavity size was normal. Wall thickness was   increased in a pattern of severe LVH. Systolic function was   normal. The estimated ejection fraction was in the range of 50%   to 55%. Wall motion was normal; there were no regional wall   motion abnormalities. Doppler parameters are consistent with   abnormal left ventricular relaxation (grade 1 diastolic   dysfunction). Doppler parameters are consistent with high   ventricular filling pressure. - Left atrium: The atrium was moderately dilated. - Right atrium: The atrium was mildly dilated.  Impressions:  - Low normal to mildly reduced LV systolic function; EF 50; severe   LVH; mild diastolic dysfunction; biatrial enlargement.  Cardiac catheterization Conclusion   LM: Normal LAD: Distal 70% diffuse disease.        Ostial 90% stenosis in D1. Patent prox Diag 1 stent LCx: Large OM1 with patent prior stent RCA: 100% occluded RPDA        Partially successful PTCA attempt        100%--99% stenosis        TIMI flow 0-I  LVEDP 16 mmHg  Recommendation: At this point, chances of successful PCI without large amount of contrast were low. Thus, I decided to stop the procedure. Fortunately, his LVEF is normal with normal wall motion. Continue medical management with DAPT (prefer aspirin and Brilinta) and aggressive hypertension and diabetes management.       Antibiotics: none  Subjective/Interval History: Patient seen after he underwent cardiac catheterization.  Denies any chest pain.  Concerned about his low glucose levels.  ROS: Denies any shortness of breath.  Objective:  Vital Signs  Vitals:   10/20/18 0949 10/20/18 1034 10/20/18 1100 10/20/18 1431  BP: (!) 162/84 (!) 173/84  (!) 158/74  Pulse: 80 81 81 66  Resp: 16 18 13 15   Temp: 98.5 F (36.9 C) 98.3 F (36.8 C)  98.2 F (36.8 C)  TempSrc: Oral Oral  Oral  SpO2: 100% 98% 100% 99%  Weight:      Height:        Intake/Output Summary (Last 24 hours) at 10/20/2018 1453 Last data filed at 10/20/2018 1330 Gross per 24 hour  Intake 112 ml  Output 2020 ml  Net -1908 ml   Filed Weights   10/17/18 0500 10/18/18 0456 10/19/18 0500  Weight: 96.6 kg 95.3 kg 95.6 kg    General appearance: alert, cooperative, appears stated age and no distress Resp: clear to auscultation bilaterally Cardio: regular rate and rhythm, S1, S2 normal, no murmur, click, rub or gallop GI: soft, non-tender; bowel sounds normal; no masses,  no organomegaly Extremities: extremities normal, atraumatic, no cyanosis or edema Neurologic: No focal neurological deficits  Lab Results:  Data Reviewed: I have personally reviewed following labs and imaging studies  CBC: Recent Labs  Lab 10/16/18 0955 10/16/18 1034 10/17/18 0622 10/18/18 0342 10/19/18 0620 10/20/18 0533  WBC 4.9  --  4.3 5.3 4.2 4.8  NEUTROABS 3.2  --   --   --   --  2.8  HGB 12.1* 12.2* 10.5* 12.3* 10.7* 10.6*  HCT 38.9* 36.0* 32.2* 38.7* 33.4* 32.4*  MCV 87.8  --  84.7 86.0 85.6 85.0  PLT 217  --  188 216 198 960    Basic Metabolic Panel: Recent Labs  Lab 10/16/18 0955 10/16/18 1034 10/17/18 0622 10/18/18 0342 10/19/18 0620 10/19/18 1805 10/20/18 0533  NA 138 139 139 138 139  --  139  K 4.2 4.2 4.4 4.2 4.5  --  4.3  CL 107 108 108 105 107  --  108  CO2 22  --  25 25 26   --  23  GLUCOSE 172* 168* 125* 73 84  --   53*  BUN 20 21 23  25* 25*  --  26*  CREATININE 2.34* 2.50* 2.48* 2.37* 2.55* 2.57* 2.43*  CALCIUM 8.2*  --  7.7* 8.4* 8.3*  --  8.0*    GFR: Estimated Creatinine Clearance: 34.8 mL/min (A) (by C-G formula based on SCr of 2.43 mg/dL (H)).  Liver Function Tests: Recent Labs  Lab 10/16/18 0955  AST 16  ALT 11  ALKPHOS 116  BILITOT 0.7  PROT 6.0*  ALBUMIN 2.2*    Cardiac Enzymes: Recent Labs  Lab 10/16/18 0955 10/16/18 1825 10/16/18 2136 10/17/18 0413  TROPONINI 0.04* 6.69* 4.73* 4.93*    CBG: Recent Labs  Lab 10/20/18 0806 10/20/18 0843 10/20/18 0946 10/20/18 1109 10/20/18 1149  GLUCAP 90 82 81 71 122*      Radiology Studies: No results found.   Medications:  Scheduled: . acetylcysteine  1,200 mg Oral BID  . amLODipine  10 mg Oral Daily  . aspirin EC  81 mg Oral Daily  . dextrose      . gabapentin  300 mg Oral BID  . hydrALAZINE  100 mg Oral Q8H  . insulin aspart  0-9 Units Subcutaneous TID WC  . insulin aspart  3 Units Subcutaneous TID WC  . isosorbide mononitrate  15 mg Oral Daily  . metoprolol tartrate  100 mg Oral BID  . montelukast  10 mg Oral Daily  . pantoprazole  40 mg Oral Daily  . rosuvastatin  20 mg Oral q1800  . sodium chloride flush  3 mL Intravenous Q12H  . [START ON 10/21/2018] ticagrelor  90 mg Oral BID   Continuous: . sodium chloride 75 mL/hr at 10/19/18 1917  . sodium chloride 100 mL/hr at 10/20/18 1045  . sodium chloride    . dextrose 25 mL/hr at 10/20/18 4540   JWJ:XBJYNW chloride, acetaminophen, hydrALAZINE, labetalol, methocarbamol, morphine injection, ondansetron (ZOFRAN) IV, sodium chloride flush    Assessment/Plan:  NSTEMI Seen by cardiology.  Underwent cardiac catheterization today.  No intervention performed.  Symptomatic and medical management for now.  Patient on dual antiplatelet treatment with aspirin and Brilinta.  He was on Plavix previously.  Statin.  Nitrates.  Patient on metoprolol.  Will request case  management to see if patient will be able to get Brilinta with his insurance.  Chronic kidney disease stage III This is secondary to diabetic nephropathy and microvascular disease.  Nephrology is following.  Patient was given Mucomyst and IV fluids to protect his kidneys due to contrast needed for the catheterization.  He does have good urine output.  Recheck his labs tomorrow.  Insulin-dependent diabetes mellitus Episodes of hypoglycemia noted.  Hold his Lantus for tonight.  Diet.  Monitor CBGs.  HbA1c was 7.1 in October.  Essential hypertension Blood pressure poorly controlled.  Continue current medications for now.  Is currently on amlodipine metoprolol nitrates.  Valsartan is on hold.  Chronic memory loss Stable.  Hyperlipidemia Continue Crestor.  DVT Prophylaxis: SCDs    Code Status: Full code Family Communication: Discussed with the patient Disposition Plan: Management as outlined above.    LOS: 3 days   [facility] Pager [phone] 10/20/2018, 2:53 PM  If 7PM-7AM, please contact night-coverage at [email or web address], password [facility]

## 2018-10-20 NOTE — Care Management (Signed)
#    7.   S/W  AOYSHA  @ [employer] # [phone]   BRILINTA 90 MG BID COVER-YES CO-PAY- $ 45.00 TIER- 3 DRUG PRIOR APPROVAL- NO  NO DEDUCTIBLE  PREFERRED PHARMACY : YES  -  [facility]

## 2018-10-20 NOTE — Care Management Note (Signed)
Case Management Note  Patient Details  Name: William Webb MRN: [id number] Date of Birth: August 17, 1951  Subjective/Objective:   From home, s/p stent intervention, on brilinta, patient has 30 day coupon, NCM informed patient of copay for refills at 45.00.  Informed patient if [place] for him just to give coupon back to RN.                 Action/Plan: DC home when ready.  Expected Discharge Date:                  Expected Discharge Plan:  Home/Self Care  In-House Referral:     Discharge planning Services  CM Consult  Post Acute Care Choice:    Choice offered to:     DME Arranged:    DME Agency:     HH Arranged:    [place] Agency:     Status of Service:  Completed, signed off  If discussed at [employer] of Stay Meetings, dates discussed:    Additional Comments:  Zenon Mayo, RN 10/20/2018, 3:44 PM

## 2018-10-20 NOTE — Progress Notes (Signed)
67 y/o [place] male with uncontrolled hypertension, type 2 DM, hyperlipidemia, CKD stage III/V, prior cath in 2017 (details could not be obtained), admitted with NSTEMI.  Until today, he has been treated with aspirin/plavix/heparin pending improvement in Cr. He likely has intrinsic kidney disease secondary to hypertension and type 2 DM. Nephrology recommendations appreciated. Cr 2.43, near his new normal. Risks, benefits, and alternate options to heart catheterization discussed in details with the patient. Specifically, risk of CIN near 12% with risk of requiring dialysis near 2%. Patient understands the risks and would like to proceed with left heart catheterization, coronary angiography, and possible intervention. In order to minimize contrast load and preserve radial artery for potential use for dialysis access in the future, I will use femoral access for the procedure. He has been hydrated overnight with normal saline, as well as given mucomist.  Nigel Mormon, MD [facility]. [place] Pager: [phone] Office: [phone] If no answer Cell [phone]

## 2018-10-20 NOTE — Progress Notes (Signed)
Hypoglycemic Event  CBG: 46  Treatment: D50, 12.5 g   Symptoms: Diaphoretic, confusion  Follow-up CBG: Time: 0622 CBG Result: 142  Possible Reasons for Event: NPO  Comments/MD notified: Alger Memos, NP   Jorene Guest

## 2018-10-20 NOTE — Progress Notes (Signed)
[facility] ROUNDING NOTE   Subjective:   Stable this morning no complaints status post left heart catheterization with coronary angiography status post STEMI.  Blood pressure 169/84 pulse 79 temperature 98.9  Urine output 2.1 L 10/19/2018  Sodium 139 potassium 4.3 chloride 108 CO2 23 glucose 53 BUN 26 creatinine 2.43 calcium 8.0 WBC 4.8 hemoglobin 10.6 platelets 198    Objective:  Vital signs in last 24 hours:  Temp:  [98 F (36.7 C)-98.9 F (37.2 C)] 98.9 F (37.2 C) (12/23 0549) Pulse Rate:  [67-81] 79 (12/23 0909) Resp:  [10-20] 15 (12/23 0909) BP: (144-192)/(66-95) 169/84 (12/23 0909) SpO2:  [94 %-100 %] 100 % (12/23 0909)  Weight change:  Filed Weights   10/17/18 0500 10/18/18 0456 10/19/18 0500  Weight: 96.6 kg 95.3 kg 95.6 kg    Intake/Output: I/O last 3 completed shifts: In: 2322 [P.O.:2322] Out: 2800 [Urine:2800]   Intake/Output this shift:  Total I/O In: -  Out: 300 [Urine:300]  CVS- RRR RS- CTA ABD- BS present soft non-distended EXT- no edema   Basic Metabolic Panel: Recent Labs  Lab 10/16/18 0955 10/16/18 1034 10/17/18 0622 10/18/18 0342 10/19/18 0620 10/19/18 1805 10/20/18 0533  NA 138 139 139 138 139  --  139  K 4.2 4.2 4.4 4.2 4.5  --  4.3  CL 107 108 108 105 107  --  108  CO2 22  --  25 25 26   --  23  GLUCOSE 172* 168* 125* 73 84  --  53*  BUN 20 21 23  25* 25*  --  26*  CREATININE 2.34* 2.50* 2.48* 2.37* 2.55* 2.57* 2.43*  CALCIUM 8.2*  --  7.7* 8.4* 8.3*  --  8.0*    Liver Function Tests: Recent Labs  Lab 10/16/18 0955  AST 16  ALT 11  ALKPHOS 116  BILITOT 0.7  PROT 6.0*  ALBUMIN 2.2*   No results for input(s): LIPASE, AMYLASE in the last 168 hours. No results for input(s): AMMONIA in the last 168 hours.  CBC: Recent Labs  Lab 10/16/18 0955 10/16/18 1034 10/17/18 0622 10/18/18 0342 10/19/18 0620 10/20/18 0533  WBC 4.9  --  4.3 5.3 4.2 4.8  NEUTROABS 3.2  --   --   --   --  2.8  HGB 12.1*  12.2* 10.5* 12.3* 10.7* 10.6*  HCT 38.9* 36.0* 32.2* 38.7* 33.4* 32.4*  MCV 87.8  --  84.7 86.0 85.6 85.0  PLT 217  --  188 216 198 198    Cardiac Enzymes: Recent Labs  Lab 10/16/18 0955 10/16/18 1825 10/16/18 2136 10/17/18 0413  TROPONINI 0.04* 6.69* 4.73* 4.93*    BNP: Invalid input(s): POCBNP  CBG: Recent Labs  Lab 10/20/18 0546 10/20/18 0625 10/20/18 0749 10/20/18 0806 10/20/18 0843  GLUCAP 46* 142* 83 90 82    Microbiology: No results found for this or any previous visit.  Coagulation Studies: No results for input(s): LABPROT, INR in the last 72 hours.  Urinalysis: No results for input(s): COLORURINE, LABSPEC, PHURINE, GLUCOSEU, HGBUR, BILIRUBINUR, KETONESUR, PROTEINUR, UROBILINOGEN, NITRITE, LEUKOCYTESUR in the last 72 hours.  Invalid input(s): APPERANCEUR    Imaging: No results found.   Medications:   . sodium chloride 75 mL/hr at 10/19/18 1917  . sodium chloride    . sodium chloride 100 mL/hr at 10/20/18 0131  . dextrose 25 mL/hr at 10/20/18 0647  . heparin 1,250 Units/hr (10/20/18 0403)   . [MAR Hold] acetylcysteine  1,200 mg Oral BID  . [MAR Hold] amLODipine  10 mg Oral Daily  . [MAR Hold] aspirin EC  81 mg Oral Daily  . dextrose      . [MAR Hold] gabapentin  300 mg Oral BID  . [MAR Hold] hydrALAZINE  100 mg Oral Q8H  . [MAR Hold] insulin aspart  0-9 Units Subcutaneous TID WC  . [MAR Hold] insulin aspart  3 Units Subcutaneous TID WC  . [MAR Hold] isosorbide mononitrate  15 mg Oral Daily  . [MAR Hold] metoprolol tartrate  100 mg Oral BID  . [MAR Hold] montelukast  10 mg Oral Daily  . [MAR Hold] pantoprazole  40 mg Oral Daily  . [MAR Hold] rosuvastatin  20 mg Oral q1800  . sodium chloride flush  3 mL Intravenous Q12H  . ticagrelor  180 mg Oral Once  . [START ON 10/21/2018] ticagrelor  90 mg Oral BID   sodium chloride, [MAR Hold] acetaminophen, [MAR Hold] hydrALAZINE, [MAR Hold] methocarbamol, [MAR Hold]  morphine injection, [MAR Hold]  ondansetron (ZOFRAN) IV, sodium chloride flush  Assessment/ Plan:   Chronic kidney disease stage III secondary to diabetes nephropathy baseline serum creatinine appears to be about 1.8-2.2.  He has a high protein creatinine ratio of 10 g renal ultrasound revealed 11 to 12 cm kidneys bilaterally with normal echogenicity.  Patient holding valsartan due to cardiac catheterization risk of contrast associated nephropathy.  Hypertension/volume appears to have a little high blood pressure this morning continues on Norvasc 10 mg daily Lopressor 100 mg twice daily.  Valsartan and Lasix being held due to optimizing renal function prior to cardiac catheterization  Diabetes mellitus as per primary team  NSTEMI awaiting results of cardiac catheterization and plan.  Appreciate help from Dr. Virgina Jock  Anemia does not appear to be an issue at this time  Secondary parathyroidism will need to check PTH.   LOS: [place] @TODAY @10 :12 AM

## 2018-10-20 NOTE — Progress Notes (Signed)
Site area: right groin  Site Prior to Removal:  Level 0  Pressure Applied For 20 MINUTES    Minutes Beginning at 1345  Manual:   Yes.    Patient Status During Pull:  WNL  Post Pull Groin Site:  Level 0  Post Pull Instructions Given:  Yes.    Post Pull Pulses Present:  Yes.    Dressing Applied:  Yes.    Comments:  Tolerated procedure  well

## 2018-10-20 NOTE — Interval H&P Note (Signed)
History and Physical Interval Note:  10/20/2018 7:37 AM  William Webb  has presented today for surgery, with the diagnosis of NSTemi  The various methods of treatment have been discussed with the patient and family. After consideration of risks, benefits and other options for treatment, the patient has consented to  Procedure(s): LEFT HEART CATH AND CORONARY ANGIOGRAPHY (N/A) as a surgical intervention .  The patient's history has been reviewed, patient examined, no change in status, stable for surgery.  I have reviewed the patient's chart and labs.  Questions were answered to the patient's satisfaction.    2016 Appropriate Use Criteria for Coronary Revascularization in Patients With Acute Coronary Syndrome NSTEMI/UA High Risk (TIMI Score 5-7) NSTEMI/Unstable angina, stabilized patient at high risk Link Here: sistemancia.com Indication:  Revascularization by PCI or CABG of 1 or more arteries in a patient with NSTEMI or unstable angina with Stabilization after presentation High risk for clinical events  A (7) Indication: 16; Score 7     [place]

## 2018-10-20 NOTE — H&P (View-Only) (Signed)
67 y/o [place] American male with uncontrolled hypertension, type 2 DM, hyperlipidemia, CKD stage III/V, prior cath in 2017 (details could not be obtained), admitted with NSTEMI.  Until today, he has been treated with aspirin/plavix/heparin pending improvement in Cr. He likely has intrinsic kidney disease secondary to hypertension and type 2 DM. Nephrology recommendations appreciated. Cr 2.43, near his new normal. Risks, benefits, and alternate options to heart catheterization discussed in details with the patient. Specifically, risk of CIN near 12% with risk of requiring dialysis near 2%. Patient understands the risks and would like to proceed with left heart catheterization, coronary angiography, and possible intervention. In order to minimize contrast load and preserve radial artery for potential use for dialysis access in the future, I will use femoral access for the procedure. He has been hydrated overnight with normal saline, as well as given mucomist.  Nigel Mormon, MD [facility] [place] Pager: [phone] Office: [phone] If no answer Cell [phone]

## 2018-10-20 NOTE — Progress Notes (Signed)
Hypoglycemic Event  CBG: 63  Treatment: D50 12.5G  Symptoms: Diaphoretic  Follow-up CBG: Time:0419 CBG Result: 84  Possible Reasons for Event:  NPO  Comments/MD notified: Alger Memos, NP   Jorene Guest

## 2018-10-21 LAB — RENAL FUNCTION PANEL
Albumin: 1.8 g/dL — ABNORMAL LOW (ref 3.5–5.0)
Anion gap: 9 (ref 5–15)
BUN: 24 mg/dL — ABNORMAL HIGH (ref 8–23)
CO2: 22 mmol/L (ref 22–32)
Calcium: 8 mg/dL — ABNORMAL LOW (ref 8.9–10.3)
Chloride: 105 mmol/L (ref 98–111)
Creatinine, Ser: 2.76 mg/dL — ABNORMAL HIGH (ref 0.61–1.24)
GFR calc Af Amer: 26 mL/min — ABNORMAL LOW (ref >60)
GFR calc non Af Amer: 23 mL/min — ABNORMAL LOW (ref >60)
Glucose, Bld: 127 mg/dL — ABNORMAL HIGH (ref 70–99)
Phosphorus: 3.8 mg/dL (ref 2.5–4.6)
Potassium: 4.5 mmol/L (ref 3.5–5.1)
Sodium: 136 mmol/L (ref 135–145)

## 2018-10-21 LAB — CBC
HCT: 30 % — ABNORMAL LOW (ref 39.0–52.0)
Hemoglobin: 9.9 g/dL — ABNORMAL LOW (ref 13.0–17.0)
MCH: 28.7 pg (ref 26.0–34.0)
MCHC: 33 g/dL (ref 30.0–36.0)
MCV: 87 fL (ref 80.0–100.0)
Platelets: 216 10*3/uL (ref 150–400)
RBC: 3.45 MIL/uL — ABNORMAL LOW (ref 4.22–5.81)
RDW: 14.2 % (ref 11.5–15.5)
WBC: 5.1 10*3/uL (ref 4.0–10.5)
nRBC: 0 % (ref 0.0–0.2)

## 2018-10-21 LAB — HEMOGLOBIN A1C
Hgb A1c MFr Bld: 6.6 % — ABNORMAL HIGH (ref 4.8–5.6)
Mean Plasma Glucose: 142.72 mg/dL

## 2018-10-21 LAB — GLUCOSE, CAPILLARY
Glucose-Capillary: 133 mg/dL — ABNORMAL HIGH (ref 70–99)
Glucose-Capillary: 108 mg/dL — ABNORMAL HIGH (ref 70–99)
Glucose-Capillary: 161 mg/dL — ABNORMAL HIGH (ref 70–99)

## 2018-10-21 LAB — PARATHYROID HORMONE, INTACT (NO CA): PTH: 52 pg/mL (ref 15–65)

## 2018-10-21 MED ORDER — ROSUVASTATIN CALCIUM 20 MG PO TABS: 20.0000 mg | ORAL_TABLET | Freq: Every day | ORAL | 0 refills | Status: DC

## 2018-10-21 MED ORDER — TICAGRELOR 90 MG PO TABS: 90.0000 mg | ORAL_TABLET | Freq: Two times a day (BID) | ORAL | 0 refills | Status: DC

## 2018-10-21 MED ORDER — HYDRALAZINE HCL 100 MG PO TABS: 100.0000 mg | ORAL_TABLET | Freq: Three times a day (TID) | ORAL | 1 refills | Status: DC

## 2018-10-21 MED ORDER — AMLODIPINE BESYLATE 10 MG PO TABS: 10.0000 mg | ORAL_TABLET | Freq: Every day | ORAL | 0 refills | Status: DC

## 2018-10-21 MED ORDER — ISOSORBIDE MONONITRATE ER 30 MG PO TB24: 15.0000 mg | ORAL_TABLET | Freq: Every day | ORAL | 0 refills | Status: DC

## 2018-10-21 MED ORDER — INSULIN DEGLUDEC 100 UNIT/ML ~~LOC~~ SOPN: 5.0000 [IU] | PEN_INJECTOR | Freq: Every day | SUBCUTANEOUS | 0 refills | Status: DC

## 2018-10-21 MED FILL — BRILINTA 90 MG TABLET: 90 | 30 days supply | Qty: 60 | Fill #0

## 2018-10-21 MED FILL — hydrALAZINE HCL 100 MG TABS: 100 | 30 days supply | Qty: 90 | Fill #0

## 2018-10-21 MED FILL — AMLODIPINE BESYLATE 10 MG T: 10 | 30 days supply | Qty: 30 | Fill #0

## 2018-10-21 MED FILL — ISOSORBIDE MN ER 30 MG TAB: 30 | 30 days supply | Qty: 15 | Fill #0

## 2018-10-21 MED FILL — ROSUVASTATIN CALCIUM 20 MG: 20 | 30 days supply | Qty: 30 | Fill #0

## 2018-10-21 NOTE — Progress Notes (Signed)
CARDIAC REHAB PHASE I   Reviewed MI book with pt. Pt educated on importance of ASA, NTG, and Brilinta. Pt given heart healthy and diabetic diets. Talked for a long time about not taking insulin until his food is in front of him, and to carry snacks with him. Reviewed restrictions and exercise guidelines. Will refer to CRP II [place].  [id number] Rufina Falco, RN BSN 10/21/2018 10:36 AM

## 2018-10-21 NOTE — Discharge Summary (Signed)
[facility]  Physician Discharge Summary   Patient ID: [facility] MRN: [id number] DOB/AGE: [birth year] 68 y.o.  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 Patient instructed to follow-up with his primary care provider for blood work. 2. Cardiology to arrange outpatient follow-up. 3. Patient to call [facility] for outpatient management of his chronic kidney disease   DISCHARGE CONDITION: fair  Diet recommendation: Modified carbohydrate  Filed Weights   10/18/18 0456 10/19/18 0500 10/21/18 0530  Weight: 95.3 kg 95.6 kg 98.5 kg    INITIAL HISTORY: 67 year old African-American male with a past medical history of insulin-dependent diabetes with episodes of hypoglycemia, chronic kidney disease stage III, essential hypertension, presented with chest pain.  Hospitalized for further management.   Reason for Visit: Chest pain, NSTEMI  Consultants: Cardiology.  Nephrology.  Procedures:   Transthoracic echocardiogram Study Conclusions  - Left ventricle: The cavity size was normal. Wall thickness was increased in a pattern of severe LVH. Systolic function was normal. The estimated ejection fraction was in the range of 50% to 55%. Wall motion was normal; there were no regional wall motion abnormalities. Doppler parameters are consistent with abnormal left ventricular relaxation (grade 1 diastolic dysfunction). Doppler parameters are consistent with high ventricular filling pressure. - Left atrium: The atrium was moderately dilated. - Right atrium: The atrium was mildly dilated.  Impressions:  - Low normal to mildly reduced LV systolic function; EF 50;  severe LVH; mild diastolic dysfunction; biatrial enlargement.  Cardiac catheterization Conclusion   LM: Normal LAD: Distal 70% diffuse disease. Ostial 90% stenosis in D1. Patent prox Diag 1 stent LCx: Large OM1 with patent prior stent RCA: 100% occluded RPDA Partially successful PTCA attempt 100%--99% stenosis TIMI flow 0-I  LVEDP 16 mmHg  Recommendation: At this point, chances of successful PCI without large amount of contrast were low. Thus, I decided to stop the procedure. Fortunately, his LVEF is normal with normal wall motion. Continue medical management with DAPT (prefer aspirin and Brilinta) and aggressive hypertension and diabetes management.      HOSPITAL COURSE:   NSTEMI Seen by cardiology.  Underwent cardiac catheterization 12/23.  No intervention performed.  Symptomatic and medical management for now.  Patient to be continued on dual antiplatelet treatment with aspirin and Brilinta.  He was on Plavix previously.  Statin.  Nitrates.  Patient on metoprolol.  Cardiology to arrange outpatient follow-up.  Chronic kidney disease stage III This is secondary to diabetic nephropathy and microvascular disease.  Nephrology was consulted due to need to do cardiac catheterization with exposure to contrast. Patient was given Mucomyst and IV fluids to protect his kidneys due to contrast needed for the catheterization.    Creatinine noted to be slightly higher today.  Discussed with Dr. Justin Mend who is okay with patient being discharged.  He will need to have blood work done in 1 week.  He has good urine output.    Insulin-dependent diabetes mellitus Patient was placed on Lantus in the hospital.  He was continued on NovoLog.  However he was found to have episodes of hypoglycemia.  His HbA1c was 7.1 in October.  Rechecked during this hospitalization and noted to be 6.6.  He is requiring lower doses of his insulin.  He will be asked to discontinue NovoLog  completely.  His dose of [place]  will be reduced significantly.  He will be asked not to take his insulin if his CBG is less than 150.  He will need to follow-up with his primary care provider for further management.    Essential hypertension Patient was taken off of valsartan due to his elevated creatinine.  Hydralazine was added.  Continue with the beta-blocker and nitrates and amlodipine.  Blood pressure is better.  Will need close monitoring in the outpatient setting.  Chronic memory loss Stable.  Hyperlipidemia Continue Crestor.  Overall stable.  Okay for discharge home today.  Discussed with cardiology and nephrology.   PERTINENT LABS:  The results of significant diagnostics from this hospitalization (including imaging, microbiology, ancillary and laboratory) are listed below for reference.      Labs: Basic Metabolic Panel: Recent Labs  Lab 10/17/18 0622 10/18/18 0342 10/19/18 0620 10/19/18 1805 10/20/18 0533 10/21/18 0414  NA 139 138 139  --  139 136  K 4.4 4.2 4.5  --  4.3 4.5  CL 108 105 107  --  108 105  CO2 25 25 26   --  23 22  GLUCOSE 125* 73 84  --  53* 127*  BUN 23 25* 25*  --  26* 24*  CREATININE 2.48* 2.37* 2.55* 2.57* 2.43* 2.76*  CALCIUM 7.7* 8.4* 8.3*  --  8.0* 8.0*  PHOS  --   --   --   --   --  3.8   Liver Function Tests: Recent Labs  Lab 10/16/18 0955 10/21/18 0414  AST 16  --   ALT 11  --   ALKPHOS 116  --   BILITOT 0.7  --   PROT 6.0*  --   ALBUMIN 2.2* 1.8*   CBC: Recent Labs  Lab 10/16/18 0955  10/17/18 0622 10/18/18 0342 10/19/18 0620 10/20/18 0533 10/21/18 0414  WBC 4.9  --  4.3 5.3 4.2 4.8 5.1  NEUTROABS 3.2  --   --   --   --  2.8  --   HGB 12.1*   < > 10.5* 12.3* 10.7* 10.6* 9.9*  HCT 38.9*   < > 32.2* 38.7* 33.4* 32.4* 30.0*  MCV 87.8  --  84.7 86.0 85.6 85.0 87.0  PLT 217  --  188 216 198 198 216   < > = values in this interval not displayed.   Cardiac Enzymes: Recent Labs  Lab 10/16/18 0955 10/16/18 1825  10/16/18 2136 10/17/18 0413  TROPONINI 0.04* 6.69* 4.73* 4.93*   BNP: BNP (last 3 results) Recent Labs    10/16/18 0955  BNP 438.3*    CBG: Recent Labs  Lab 10/20/18 2150 10/20/18 2234 10/21/18 0102 10/21/18 0515 10/21/18 1123  GLUCAP 140* 118* 133* 108* 161*     IMAGING STUDIES US Renal  Result Date: 10/17/2018 CLINICAL DATA:  67 year old male with chronic kidney disease EXAM: RENAL / URINARY TRACT ULTRASOUND COMPLETE COMPARISON:  None. FINDINGS: Right Kidney: Renal measurements: 12.0 x 6.1 x 7.3 centimeters = volume: 279 mL. Cortical echogenicity within normal limits. No right hydronephrosis. Simple appearing upper pole cyst measuring 18 millimeters. No solid right renal mass. Left Kidney: Renal measurements: 10.9 x 5.8 x 5.7 centimeters = volume: 188 mL. Echogenicity within normal limits. No mass or hydronephrosis visualized. Bladder: Appears normal for degree of bladder distention. Other findings: Prostate enlargement up to 6.4 centimeters diameter. IMPRESSION: Negative ultrasound appearance of both kidneys and the urinary bladder. Electronically Signed   By: Genevie Ann M.D.   On: 10/17/2018 14:22  Dg Chest Portable 1 View  Result Date: 10/16/2018 CLINICAL DATA:  Chest pain and hypertension EXAM: PORTABLE CHEST 1 VIEW COMPARISON:  06/06/2018 FINDINGS: Mild cardiomegaly. Negative aortic and hilar contours. There is no edema, consolidation, effusion, or pneumothorax. Chronic metallic foreign body along the left posterior back (based on prior lateral view). IMPRESSION: No evidence of acute disease. Electronically Signed   By: Monte Fantasia M.D.   On: 10/16/2018 10:22    DISCHARGE EXAMINATION: Vitals:   10/21/18 0435 10/21/18 0530 10/21/18 0715 10/21/18 1041  BP: (!) 162/78 (!) 162/78 (!) 157/72 (!) 171/79  Pulse: 66 71 74 74  Resp: 20 17 14 17   Temp:  99.6 F (37.6 C) 98.5 F (36.9 C)   TempSrc:  Oral Oral   SpO2: 98% 98% 96%   Weight:  98.5 kg    Height:        General appearance: alert, cooperative, appears stated age and no distress Resp: clear to auscultation bilaterally Cardio: regular rate and rhythm, S1, S2 normal, no murmur, click, rub or gallop GI: soft, non-tender; bowel sounds normal; no masses,  no organomegaly  DISPOSITION: Home  Discharge Instructions    Amb Referral to Cardiac Rehabilitation   Complete by:  As directed    Diagnosis:  NSTEMI   Call MD for:  extreme fatigue   Complete by:  As directed    Call MD for:  persistant dizziness or light-headedness   Complete by:  As directed    Call MD for:  persistant nausea and vomiting   Complete by:  As directed    Call MD for:  severe uncontrolled pain   Complete by:  As directed    Call MD for:  temperature >100.4   Complete by:  As directed    Diet Carb Modified   Complete by:  As directed    Discharge instructions   Complete by:  As directed    Please check your glucose levels 3 times a day before each meal.  We have discontinued your NovoLog completely.  Tresiba dose has been decreased significantly.  Do not take if your CBG levels less than 150.  If they are greater than 150 then go up on the dose of Tresiba by 2 units every day.  You were cared for by a hospitalist during your hospital stay. If you have any questions about your discharge medications or the care you received while you were in the hospital after you are discharged, you can call the unit and asked to speak with the hospitalist on call if the hospitalist that took care of you is not available. Once you are discharged, your primary care physician will handle any further medical issues. Please note that NO REFILLS for any discharge medications will be authorized once you are discharged, as it is imperative that you return to your primary care physician (or establish a relationship with a primary care physician if you do not have one) for your aftercare needs so that they can reassess your need for medications and  monitor your lab values. If you do not have a primary care physician, you can call [phone] for a physician referral.   Increase activity slowly   Complete by:  As directed         Allergies as of 10/21/2018   No Known Allergies     Medication List    STOP taking these medications   clopidogrel 75 MG tablet Commonly known as:  PLAVIX   insulin aspart 100  UNIT/ML injection Commonly known as:  novoLOG   valsartan 320 MG tablet Commonly known as:  DIOVAN   zinc gluconate 50 MG tablet     TAKE these medications   acetaminophen 500 MG tablet Commonly known as:  TYLENOL Take 1,000 mg by mouth 2 (two) times daily.   amLODipine 10 MG tablet Commonly known as:  NORVASC Take 1 tablet (10 mg total) by mouth daily.   aspirin EC 81 MG tablet Take 1 tablet (81 mg total) by mouth daily.   BD PEN NEEDLE NANO U/F 32G X 4 MM Misc Generic drug:  Insulin Pen Needle USE AS DIRECTED FOR TRESIBA INJECTIONS ONCE DAILY   cholecalciferol 25 MCG (1000 UT) tablet Commonly known as:  VITAMIN D Take 1,000 Units by mouth daily.   CLEAR EYES COMPLETE OP Place 4-5 drops into both eyes daily as needed (dry eyes).   Cyanocobalamin 2500 MCG Chew Chew 2,500 mcg by mouth daily.   furosemide 20 MG tablet Commonly known as:  LASIX TAKE 1 TABLET BY MOUTH ONCE DAILY What changed:    when to take this  reasons to take this   gabapentin 600 MG tablet Commonly known as:  NEURONTIN Take 2 tablets (1,200 mg total) by mouth 3 (three) times daily.   hydrALAZINE 100 MG tablet Commonly known as:  APRESOLINE Take 1 tablet (100 mg total) by mouth every 8 (eight) hours.   insulin degludec 100 UNIT/ML Sopn FlexTouch Pen Commonly known as:  TRESIBA FLEXTOUCH Inject 0.05 mLs (5 Units total) into the skin daily. DO NOT TAKE IF CBG'S ARE LESS THAN 150 What changed:  See the new instructions.   isosorbide mononitrate 30 MG 24 hr tablet Commonly known as:  IMDUR Take 0.5 tablets (15 mg total) by  mouth daily. Start taking on:  October 22, 2018   methocarbamol 500 MG tablet Commonly known as:  ROBAXIN TAKE 1 TABLET BY MOUTH TWICE DAILY AS NEEDED FOR MUSCLE SPASM What changed:  See the new instructions.   metoprolol tartrate 100 MG tablet Commonly known as:  LOPRESSOR Take 1 tablet (100 mg total) by mouth 2 (two) times daily.   montelukast 10 MG tablet Commonly known as:  SINGULAIR Take 1 tablet (10 mg total) by mouth at bedtime. What changed:  when to take this   omeprazole 20 MG capsule Commonly known as:  PRILOSEC Take 20 mg by mouth daily.   rosuvastatin 20 MG tablet Commonly known as:  CRESTOR Take 1 tablet (20 mg total) by mouth daily at 6 PM. What changed:    medication strength  how much to take   ticagrelor 90 MG Tabs tablet Commonly known as:  BRILINTA Take 1 tablet (90 mg total) by mouth 2 (two) times daily.   vitamin E 1000 UNIT capsule Generic drug:  vitamin E Take 1,000 Units by mouth daily.        Follow-up Information    Billie Ruddy, MD. Schedule an appointment as soon as possible for a visit in 1 week(s).   Specialty:  Family Medicine Why:  Your PCP will need to do blood work in 1 week to check your kidney function Contact information: [place] [address] [phone]        Nigel Mormon, MD Follow up.   Specialty:  Cardiology Why:  His office will call for follow up Contact information: [place] [address] [phone]        Claudia Desanctis, MD  Follow up.   Specialty:  Internal Medicine Why:  Please call them to schedule appointment in 2 weeks. Contact information: [place] [address] [phone]           TOTAL DISCHARGE TIME: 35 mins  Bonnielee Haff  [facility] Pager [phone]  10/21/2018, 1:10 PM

## 2018-10-21 NOTE — Discharge Instructions (Signed)
Coronary Artery Disease, Male  Coronary artery disease (CAD) is a condition in which the arteries that lead to the heart (coronary arteries) become narrow or blocked. The narrowing or blockage can lead to decreased blood flow to the heart. Prolonged reduced blood flow can cause a heart attack (myocardial infarction or MI). This condition may also be called coronary heart disease. Because CAD is the leading cause of death in men, it is important to understand what causes this condition and how it is treated. What are the causes? CAD is most often caused by atherosclerosis. This is the buildup of fat and cholesterol (plaque) on the inside of the arteries. Over time, the plaque may narrow or block the artery, reducing blood flow to the heart. Plaque can also become weak and break off within a coronary artery and cause a sudden blockage. Other less common causes of CAD include:  An embolism or blood clot in a coronary artery.  A tearing of the artery (spontaneous coronary artery dissection).  An aneurysm.  Inflammation (vasculitis) in the artery wall. What increases the risk? The following factors may make you more likely to develop this condition:  Age. Men over age 23 are at a greater risk of CAD.  Family history of CAD.  Gender. Men often develop CAD earlier in life than women.  High blood pressure (hypertension).  Diabetes.  High cholesterol levels.  Tobacco use.  Excessive alcohol use.  Lack of exercise.  A diet high in saturated and trans fats, such as fried food and processed meat. Other possible risk factors include:  High stress levels.  Depression.  Obesity.  Sleep apnea. What are the signs or symptoms? Many people do not have any symptoms during the early stages of CAD. As the condition progresses, symptoms may include:  Chest pain (angina). The pain can: ? Feel like a crushing or squeezing, or a tightness, pressure, fullness, or heaviness in the chest. ? Last  more than a few minutes or can stop and recur. The pain tends to get worse with exercise or stress and to fade with rest.  Pain in the arms, neck, jaw, or back.  Unexplained heartburn or indigestion.  Shortness of breath.  Nausea or vomiting.  Sudden light-headedness.  Sudden cold sweats.  Fluttering or fast heartbeat (palpitations). How is this diagnosed? This condition is diagnosed based on:  Your family and medical history.  A physical exam.  Tests, including: ? A test to check the electrical signals in your heart (electrocardiogram). ? Exercise stress test. This looks for signs of blockage when the heart is stressed with exercise, such as running on a treadmill. ? Pharmacologic stress test. This test looks for signs of blockage when the heart is being stressed with a medicine. ? Blood tests. ? Coronary angiogram. This is a procedure to look at the coronary arteries to see if there is any blockage. During this test, a dye is injected into your arteries so they appear on an X-ray. ? A test that uses sound waves to take a picture of your heart (echocardiogram). ? Chest X-ray. How is this treated? This condition may be treated by:  Healthy lifestyle changes to reduce risk factors.  Medicines such as: ? Antiplatelet medicines and blood-thinning medicines, such as aspirin. These help to prevent blood clots. ? Nitroglycerin. ? Blood pressure medicines. ? Cholesterol-lowering medicine.  Coronary angioplasty and stenting. During this procedure, a thin, flexible tube is inserted through a blood vessel and into a blocked artery. A balloon  or similar device on the end of the tube is inflated to open up the artery. In some cases, a small, mesh tube (stent) is inserted into the artery to keep it open.  Coronary artery bypass surgery. During this surgery, veins or arteries from other parts of the body are used to create a bypass around the blockage and allow blood to reach your  heart. Follow these instructions at home: Medicines  Take over-the-counter and prescription medicines only as told by your health care provider.  Do not take the following medicines unless your health care provider approves: ? NSAIDs, such as ibuprofen, naproxen, or celecoxib. ? Vitamin supplements that contain vitamin A, vitamin E, or both. Lifestyle  Follow an exercise program approved by your health care provider. Aim for 150 minutes of moderate exercise or 75 minutes of vigorous exercise each week.  Maintain a healthy weight or lose weight as approved by your health care provider.  Rest when you are tired.  Learn to manage stress or try to limit your stress. Ask your health care provider for suggestions if you need help.  Get screened for depression and seek treatment, if needed.  Do not use any products that contain nicotine or tobacco, such as cigarettes and e-cigarettes. If you need help quitting, ask your health care provider.  Do not use illegal drugs. Eating and drinking  Follow a heart-healthy diet. A dietitian can help educate you about healthy food options and changes. In general, eat plenty of fruits and vegetables, lean meats, and whole grains.  Avoid foods high in: ? Sugar. ? Salt (sodium). ? Saturated fat, such as processed or fatty meat. ? Trans fat, such as fried foods.  Use healthy cooking methods such as roasting, grilling, broiling, baking, poaching, steaming, or stir-frying.  If you drink alcohol, and your health care provider approves, limit your alcohol intake to no more than 2 drinks per day. One drink equals 12 ounces of beer, 5 ounces of wine, or 1 ounces of hard liquor. General instructions  Manage any other health conditions, such as hypertension and diabetes. These conditions affect your heart.  Your health care provider may ask you to monitor your blood pressure. Ideally, your blood pressure should be below 130/80.  Keep all follow-up visits  as told by your health care provider. This is important. Get help right away if:  You have pain in your chest, neck, arm, jaw, stomach, or back that: ? Lasts more than a few minutes. ? Is recurring. ? Is not relieved by taking medicine under your tongue (sublingualnitroglycerin).  You have too much (profuse) sweating without cause.  You have unexplained: ? Heartburn or indigestion. ? Shortness of breath or difficulty breathing. ? Fluttering or fast heartbeat (palpitations). ? Nausea or vomiting. ? Fatigue. ? Feelings of nervousness or anxiety. ? Weakness. ? Diarrhea.  You have sudden light-headedness or dizziness.  You faint.  You feel like hurting yourself or think about taking your own life. These symptoms may represent a serious problem that is an emergency. Do not wait to see if the symptoms will go away. Get medical help right away. Call your local emergency services (911 in the [place] Do not drive yourself to the hospital. Summary  Coronary artery disease (CAD) is a process in which the arteries that lead to the heart (coronary arteries) become narrow or blocked. The narrowing or blockage can lead to a heart attack.  Many people do not have any symptoms during the early stages of CAD.  This is called "silent CAD."  CAD can be treated with lifestyle changes, medicines, surgery, or a combination of these treatments. This information is not intended to replace advice given to you by your health care provider. Make sure you discuss any questions you have with your health care provider. Document Released: 05/12/2014 Document Revised: 10/05/2016 Document Reviewed: 10/05/2016 Elsevier Interactive Patient Education  2019 [employer].

## 2018-10-21 NOTE — Progress Notes (Signed)
[facility] ROUNDING NOTE   Subjective:    status post left heart catheterization with coronary angiography status post STEMI.  Some hypoglycemia this morning  Blood pressure 157/72 pulse 74 temperature 98.5 O2 sats 96% room air    Urine output 2.1 L 10/19/2018  Sodium 139 potassium 4.3 chloride 108 CO2 23 glucose 53 BUN 26 creatinine 2.43 calcium 8.0 WBC 4.8 hemoglobin 10.6 platelets 198  Blood sugars 140-118-133-108  Objective:  Vital signs in last 24 hours:  Temp:  [98.2 F (36.8 C)-99.6 F (37.6 C)] 98.5 F (36.9 C) (12/24 0715) Pulse Rate:  [66-81] 74 (12/24 0715) Resp:  [13-24] 14 (12/24 0715) BP: (156-173)/(72-84) 157/72 (12/24 0715) SpO2:  [96 %-100 %] 96 % (12/24 0715) Weight:  [98.5 kg] 98.5 kg (12/24 0530)  Weight change:  Filed Weights   10/18/18 0456 10/19/18 0500 10/21/18 0530  Weight: 95.3 kg 95.6 kg 98.5 kg    Intake/Output: I/O last 3 completed shifts: In: 2065.4 [P.O.:840; I.V.:1225.4] Out: 2340 [Urine:2340]   Intake/Output this shift:  No intake/output data recorded.  CVS- RRR RS- CTA ABD- BS present soft non-distended EXT- no edema   Basic Metabolic Panel: Recent Labs  Lab 10/17/18 0622 10/18/18 0342 10/19/18 0620 10/19/18 1805 10/20/18 0533 10/21/18 0414  NA 139 138 139  --  139 136  K 4.4 4.2 4.5  --  4.3 4.5  CL 108 105 107  --  108 105  CO2 25 25 26   --  23 22  GLUCOSE 125* 73 84  --  53* 127*  BUN 23 25* 25*  --  26* 24*  CREATININE 2.48* 2.37* 2.55* 2.57* 2.43* 2.76*  CALCIUM 7.7* 8.4* 8.3*  --  8.0* 8.0*  PHOS  --   --   --   --   --  3.8    Liver Function Tests: Recent Labs  Lab 10/16/18 0955 10/21/18 0414  AST 16  --   ALT 11  --   ALKPHOS 116  --   BILITOT 0.7  --   PROT 6.0*  --   ALBUMIN 2.2* 1.8*   No results for input(s): LIPASE, AMYLASE in the last 168 hours. No results for input(s): AMMONIA in the last 168 hours.  CBC: Recent Labs  Lab 10/16/18 0955  10/17/18 0622 10/18/18 0342  10/19/18 0620 10/20/18 0533 10/21/18 0414  WBC 4.9  --  4.3 5.3 4.2 4.8 5.1  NEUTROABS 3.2  --   --   --   --  2.8  --   HGB 12.1*   < > 10.5* 12.3* 10.7* 10.6* 9.9*  HCT 38.9*   < > 32.2* 38.7* 33.4* 32.4* 30.0*  MCV 87.8  --  84.7 86.0 85.6 85.0 87.0  PLT 217  --  188 216 198 198 216   < > = values in this interval not displayed.    Cardiac Enzymes: Recent Labs  Lab 10/16/18 0955 10/16/18 1825 10/16/18 2136 10/17/18 0413  TROPONINI 0.04* 6.69* 4.73* 4.93*    BNP: Invalid input(s): POCBNP  CBG: Recent Labs  Lab 10/20/18 1954 10/20/18 2150 10/20/18 2234 10/21/18 0102 10/21/18 0515  GLUCAP 117* 140* 118* 133* 108*    Microbiology: No results found for this or any previous visit.  Coagulation Studies: No results for input(s): LABPROT, INR in the last 72 hours.  Urinalysis: No results for input(s): COLORURINE, LABSPEC, PHURINE, GLUCOSEU, HGBUR, BILIRUBINUR, KETONESUR, PROTEINUR, UROBILINOGEN, NITRITE, LEUKOCYTESUR in the last 72 hours.  Invalid input(s): APPERANCEUR  Imaging: No results found.   Medications:   . sodium chloride    . dextrose 25 mL/hr at 10/21/18 0700   . acetylcysteine  6 mL Oral BID  . amLODipine  10 mg Oral Daily  . aspirin EC  81 mg Oral Daily  . gabapentin  300 mg Oral BID  . hydrALAZINE  100 mg Oral Q8H  . insulin aspart  0-9 Units Subcutaneous TID WC  . insulin aspart  3 Units Subcutaneous TID WC  . isosorbide mononitrate  15 mg Oral Daily  . metoprolol tartrate  100 mg Oral BID  . montelukast  10 mg Oral Daily  . pantoprazole  40 mg Oral Daily  . rosuvastatin  20 mg Oral q1800  . sodium chloride flush  3 mL Intravenous Q12H  . 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  He has a  high protein creatinine ratio of 10 g renal ultrasound revealed 11 to 12 cm kidneys bilaterally with normal echogenicity.  Patient holding valsartan due to cardiac catheterization risk of contrast associated nephropathy.  Hypertension/volume appears to have a little high blood pressure this morning continues on Norvasc 10 mg daily Lopressor 100 mg twice daily.  Valsartan and Lasix being held due to optimizing renal function prior to cardiac catheterization  Diabetes mellitus some hyperglycemia noted.  Adjust insulin per primary  NSTEMI awaiting results of cardiac catheterization and plan.  Appreciate help from Dr. Virgina Jock  Anemia does not appear to be an issue at this time  Secondary parathyroidism PTH 53  Will sign off today creatinine appears to be at baseline.  I would keep holding valsartan seen at [facility].  Follow-up 2 to 4 weeks   LOS: 4 Sherril Croon @TODAY @9 :16 AM

## 2018-10-23 ENCOUNTER — Telehealth (HOSPITAL_COMMUNITY): Payer: Self-pay

## 2018-10-23 ENCOUNTER — Other Ambulatory Visit: Payer: Self-pay | Admitting: Family Medicine

## 2018-10-23 MED ORDER — INSULIN DEGLUDEC 100 UNIT/ML ~~LOC~~ SOPN: 5.0000 [IU] | PEN_INJECTOR | Freq: Every day | SUBCUTANEOUS | 2 refills | Status: DC

## 2018-10-23 NOTE — Telephone Encounter (Signed)
Pt called and stated that he went to [facility] and they told him that they only had " novolog " ready for pick up and patient said that he was advised not to take that anymore. Can someone reach out to the pharmacy because he said he really needs to take his insulin today

## 2018-10-23 NOTE — Telephone Encounter (Signed)
Copied from [place]. Topic: Quick Communication - Rx Refill/Question >> Oct 23, 2018 10:54 AM Windy Kalata wrote: Medication: insulin degludec (TRESIBA FLEXTOUCH) 100 UNIT/ML SOPN FlexTouch Pen  Has the patient contacted their pharmacy? No. (Agent: If no, request that the patient contact the pharmacy for the refill.) (Agent: If yes, when and what did the pharmacy advise?)  Preferred Pharmacy (with phone number or street name): [place], [place] [phone] (Phone) [phone] (Fax)    Agent: Please be advised that RX refills may take up to 3 business days. We ask that you follow-up with your pharmacy.

## 2018-10-23 NOTE — Telephone Encounter (Signed)
Pt insurance is active and benefits verified through [employer]. Co-pay $0.00, DED $750.00/$750.00 met, out of pocket $3.500.00/$3,500.00 met, co-insurance 20%. No pre-authorization required. Passport, 10/23/18 @ 4:19PM, REF# [id number]  2ndary insurance is active and benefits verified through [facility].  Co-pay $10.00, DED $0.00/$0.00 met, out of pocket $3,400.00/$0.00 met, co-insurance 0%. No pre-authorization required. Passport, 10/23/18 @ 4:21PM, REF# [id number]  Will contact patient to see if he is interested in the Cardiac Rehab Program. If interested, patient will need to complete follow up appt. Once completed, patient will be contacted for scheduling upon review by the RN Navigator.

## 2018-10-23 NOTE — Addendum Note (Signed)
Addended by: Rebecca Eaton on: 10/23/2018 03:29 PM   Modules accepted: Orders

## 2018-10-23 NOTE — Telephone Encounter (Signed)
Spoke with pharmacy, they did not receive the Rx. It has been resent

## 2018-10-24 ENCOUNTER — Other Ambulatory Visit: Payer: Self-pay | Admitting: Family Medicine

## 2018-10-24 MED ORDER — INSULIN PEN NEEDLE 32G X 4 MM MISC: 1 refills | Status: DC

## 2018-10-24 NOTE — Telephone Encounter (Signed)
Copied from [place] [id number]. Topic: Quick Communication - Rx Refill/Question >> Oct 24, 2018 12:34 PM Sheran Luz wrote: Medication: PEN NEEDLE NANO U/F 32G X 4 MM MISC   Patient is out of supplies, patient states supplies were supposed to be sent to pharmacy with his insulin request.  Preferred Pharmacy (with phone number or street name): [place], [place]

## 2018-10-27 ENCOUNTER — Ambulatory Visit: Payer: Self-pay | Admitting: *Deleted

## 2018-10-27 ENCOUNTER — Other Ambulatory Visit: Payer: Self-pay | Admitting: Family Medicine

## 2018-10-27 NOTE — Telephone Encounter (Signed)
Pt will need to contact pharmacy. Refill should be available at requested pharmacy.

## 2018-10-27 NOTE — Telephone Encounter (Signed)
Addemdum: Viewed patient's hospital summary, he underwent cardiac catherization No intervention performed. Patient requested refill on Singulair 10 MG tabs. TC to his [facility]-he has a refill available. Informed patient.

## 2018-10-27 NOTE — Telephone Encounter (Signed)
Patient was hospitalized on 12/19 through 12/24. He had heart surgery with balloon placement and treated for kidney damage. Patient calls with [facility] RN, Claiborne Billings as he has been coughing up blood streaked sputum that he reports he was doing prior to this heart surgery. Stated he was started on Brilinta 90 MG tabs twice daily while in the hospital. Mr. Loftin reports he did not tell the hospital physician that he was coughing up streaks of blood. Stated he strains with coughing and trying to get up phlegm and that is when he sees the streaks. He also reports having chills occasionally since being discharged from hospital. He has not checked his temperature. He will buy a thermometer and start checking.  He has mild SOB with exertion, none with sitting. Denies any chest pain/abdominal pain. Denies bloody urine/stool. I reviewed symptoms which would need immediate emergency evaluation including increased SOB, increased amount of bloody streaks in his phlegm or CP. He and his wife stated understanding and that he would keep Friday's appointment when his daughter would be here to attend with him. Routing to PCP for further advice. Reason for Disposition . Taking Coumadin (warfarin) or other strong blood thinner, or known bleeding disorder (e.g., thrombocytopenia)  Answer Assessment - Initial Assessment Questions 1. ONSET: "When did you start coughing up blood?"     Started on Wednesday 2. SEVERITY: "How many times?" "How much blood?" (e.g., flecks, streaks, tablespoons, etc)     streaks 3. COUGHING SPASMS: "Did the blood appear after a coughing spell?"       With a coughing spell 4. RESPIRATORY DISTRESS: "Describe your breathing."      Feels SOB while walking 5. FEVER: "Do you have a fever?" If so, ask: "What is your temperature, how was it measured, and when did it start?"     Has chills just as he had in the hospital. 6. SPUTUM: "Describe the color of your sputum" (clear, white, yellow, green), "Has  there been any change recently?"     clear 7. CARDIAC HISTORY: "Do you have any history of heart disease?" (e.g., heart attack, congestive heart failure)      Recent heart condition and had blockage with balloon placed.  8. LUNG HISTORY: "Do you have any history of lung disease?"  (e.g., pulmonary embolus, asthma, emphysema)     He had pneumonia in the past. 9. PE RISK FACTORS: "Do you have a history of blood clots?" (or: recent major surgery, recent prolonged travel, bedridden)     Recent heart surgery 10. OTHER SYMPTOMS: "Do you have any other symptoms?" (e.g., nosebleed, chest pain, abdominal pain, vomiting)       Denies CP. Sharp shooting pains in his back on left side. Resolved on its own.vomited several times since being home from the hospital, mostly yesterday. Denies nosebleed. 11. PREGNANCY: "Is there any chance you are pregnant?" "When was your last menstrual period?"       na 12. TRAVEL: "Have you traveled out of the country in the last month?" (e.g., travel history, exposures)       no  Protocols used: COUGHING UP BLOOD-A-AH

## 2018-10-27 NOTE — Telephone Encounter (Signed)
Copied from [place] [id number]. Topic: Quick Communication - Rx Refill/Question >> Oct 27, 2018  4:34 PM Waldemar Dickens, Sade R wrote: Medication: montelukast (SINGULAIR) 10 MG tablet ,   Has the patient contacted their pharmacy? Yes  Preferred Pharmacy (with phone number or street name): [place], [place] [phone] (Phone) [phone] (Fax)    Agent: Please be advised that RX refills may take up to 3 business days. We ask that you follow-up with your pharmacy.

## 2018-10-28 NOTE — Telephone Encounter (Signed)
Patient called, left VM to call [facility] for refill, Singulair was sent on 03/06/18 #90/3 refills. Advised if there is a problem after speaking to the pharmacy to call us back.

## 2018-10-28 NOTE — Telephone Encounter (Signed)
Spoke with Dorothyann Peng, He advised that the patient go to the ER.   Called patient he expressed understanding. A male got on the phone and asked if she could send over a picture because she's "not sure if its blood or infection." and wanted someone to look at the picture so that maybe he could just come into the office. She was advised that we are only here half a day today, that all providers schedules are full and he is still advised to go to the ER to been seen.

## 2018-10-28 NOTE — Telephone Encounter (Signed)
Please advise in the absence of Dr. Volanda Napoleon.

## 2018-10-28 NOTE — Telephone Encounter (Signed)
FYI

## 2018-10-29 ENCOUNTER — Emergency Department (HOSPITAL_COMMUNITY): Payer: BLUE CROSS/BLUE SHIELD

## 2018-10-29 ENCOUNTER — Emergency Department (HOSPITAL_COMMUNITY)
Admission: EM | Admit: 2018-10-29 | Discharge: 2018-10-29 | Disposition: A | Discharge status: Home / Self Care | Payer: BLUE CROSS/BLUE SHIELD | Attending: Emergency Medicine | Admitting: Emergency Medicine

## 2018-10-29 DIAGNOSIS — E785 Hyperlipidemia, unspecified: Secondary | ICD-10-CM | POA: Insufficient documentation

## 2018-10-29 DIAGNOSIS — Z794 Long term (current) use of insulin: Secondary | ICD-10-CM | POA: Insufficient documentation

## 2018-10-29 DIAGNOSIS — I509 Heart failure, unspecified: Secondary | ICD-10-CM | POA: Insufficient documentation

## 2018-10-29 DIAGNOSIS — I2510 Atherosclerotic heart disease of native coronary artery without angina pectoris: Secondary | ICD-10-CM | POA: Insufficient documentation

## 2018-10-29 DIAGNOSIS — N183 Chronic kidney disease, stage 3 (moderate): Secondary | ICD-10-CM | POA: Insufficient documentation

## 2018-10-29 DIAGNOSIS — Z79899 Other long term (current) drug therapy: Secondary | ICD-10-CM | POA: Insufficient documentation

## 2018-10-29 DIAGNOSIS — N289 Disorder of kidney and ureter, unspecified: Secondary | ICD-10-CM

## 2018-10-29 DIAGNOSIS — I130 Hypertensive heart and chronic kidney disease with heart failure and stage 1 through stage 4 chronic kidney disease, or unspecified chronic kidney disease: Secondary | ICD-10-CM | POA: Insufficient documentation

## 2018-10-29 DIAGNOSIS — Z7982 Long term (current) use of aspirin: Secondary | ICD-10-CM | POA: Diagnosis not present

## 2018-10-29 DIAGNOSIS — R42 Dizziness and giddiness: Secondary | ICD-10-CM | POA: Diagnosis not present

## 2018-10-29 DIAGNOSIS — Z87891 Personal history of nicotine dependence: Secondary | ICD-10-CM | POA: Diagnosis not present

## 2018-10-29 DIAGNOSIS — E11649 Type 2 diabetes mellitus with hypoglycemia without coma: Secondary | ICD-10-CM | POA: Diagnosis present

## 2018-10-29 LAB — CBC
HCT: 29.1 % — ABNORMAL LOW (ref 39.0–52.0)
Hemoglobin: 9.3 g/dL — ABNORMAL LOW (ref 13.0–17.0)
MCH: 27.8 pg (ref 26.0–34.0)
MCHC: 32 g/dL (ref 30.0–36.0)
MCV: 86.9 fL (ref 80.0–100.0)
Platelets: 289 10*3/uL (ref 150–400)
RBC: 3.35 MIL/uL — ABNORMAL LOW (ref 4.22–5.81)
RDW: 12.7 % (ref 11.5–15.5)
WBC: 7.4 10*3/uL (ref 4.0–10.5)
nRBC: 0 % (ref 0.0–0.2)

## 2018-10-29 LAB — URINALYSIS, ROUTINE W REFLEX MICROSCOPIC
Bacteria, UA: NONE SEEN
Bilirubin Urine: NEGATIVE
Glucose, UA: 150 mg/dL — AB
Hgb urine dipstick: NEGATIVE
Ketones, ur: NEGATIVE mg/dL
Leukocytes, UA: NEGATIVE
Nitrite: NEGATIVE
Protein, ur: >=300 mg/dL — AB
Specific Gravity, Urine: 1.01 (ref 1.005–1.030)
pH: 7 (ref 5.0–8.0)

## 2018-10-29 LAB — BASIC METABOLIC PANEL
Anion gap: 10 (ref 5–15)
BUN: 26 mg/dL — ABNORMAL HIGH (ref 8–23)
CO2: 23 mmol/L (ref 22–32)
Calcium: 7.8 mg/dL — ABNORMAL LOW (ref 8.9–10.3)
Chloride: 100 mmol/L (ref 98–111)
Creatinine, Ser: 2.73 mg/dL — ABNORMAL HIGH (ref 0.61–1.24)
GFR calc Af Amer: 26 mL/min — ABNORMAL LOW (ref >60)
GFR calc non Af Amer: 23 mL/min — ABNORMAL LOW (ref >60)
Glucose, Bld: 186 mg/dL — ABNORMAL HIGH (ref 70–99)
Potassium: 4.5 mmol/L (ref 3.5–5.1)
Sodium: 133 mmol/L — ABNORMAL LOW (ref 135–145)

## 2018-10-29 LAB — TROPONIN I: Troponin I: 0.05 ng/mL (ref <0.03)

## 2018-10-29 LAB — BRAIN NATRIURETIC PEPTIDE: B Natriuretic Peptide: 776.7 pg/mL — ABNORMAL HIGH (ref 0.0–100.0)

## 2018-10-29 LAB — CBG MONITORING, ED: Glucose-Capillary: 158 mg/dL — ABNORMAL HIGH (ref 70–99)

## 2018-10-29 MED ORDER — FUROSEMIDE 10 MG/ML IJ SOLN
40.0000 mg | Freq: Once | INTRAMUSCULAR | Status: AC
  Administered 2018-10-29: 40 mg via INTRAVENOUS
  Filled 2018-10-29: qty 4

## 2018-10-29 MED ORDER — LACTATED RINGERS IV BOLUS
1000.0000 mL | Freq: Once | INTRAVENOUS | Status: AC
  Administered 2018-10-29: 1000 mL via INTRAVENOUS

## 2018-10-29 MED ORDER — FUROSEMIDE 10 MG/ML IJ SOLN: 20.0000 mg | Freq: Once | INTRAMUSCULAR | Status: DC

## 2018-10-29 MED ORDER — BENZONATATE 100 MG PO CAPS
100.0000 mg | ORAL_CAPSULE | Freq: Once | ORAL | Status: AC
  Administered 2018-10-29: 100 mg via ORAL
  Filled 2018-10-29: qty 1

## 2018-10-29 NOTE — ED Triage Notes (Addendum)
Pt reports he cannot keep his blood sugars up, states that it has been running low in the 90's which is abnormal for him, states he feels generally weak. Reports he was recently in the hospital for the same, states he is unable to eat due to lack of appetite. Pt a/ox4, speech clear, resp e/u, nad. Skin warm and dry

## 2018-10-29 NOTE — Discharge Instructions (Addendum)
Your heart failure got slightly worse.   Please take lasix 20 mg daily x 3 days then as needed.   You need repeat kidney function test in a week with your doctor.   Follow up with cardiology next week as well  Return to ER if you have worse shortness of breath, chest pain, dizziness, passing out

## 2018-10-29 NOTE — ED Provider Notes (Signed)
  Physical Exam  BP (!) 170/79   Pulse 70   Temp 98.9 F (37.2 C) (Oral)   Resp 19   SpO2 96%   Physical Exam  ED Course/Procedures     Procedures  MDM  Patient care assumed at 4 pm from Dr. Dolly Rias.  Patient had a recent NSTEMI and had a cardiac catheterization that showed diffuse disease.  Cardiology decided that it is best for him to be treated medically so he is on dual platelet therapy.  Patient came here for dizziness and poor appetite.  Denies any chest pain or worsening shortness of breath.  Signout pending troponin, BNP, chest x-ray and CT head.  CT head is negative no need for MRI to rule out posterior circulation stroke. Dr. Dolly Rias had a discussion with him regarding close follow up with cardiology vs admission and he wanted to follow up with cardiology if labs are stable.   6:45 PM Cr. 0.05, down from 4 in the hospital. BNP increased to 776. CT head negative. CXR showed some effusion. I think likely slightly worsening heart failure. He is on lasix 20 mg daily prn. Given lasix 40 mg IV today. Will have him take 20 mg daily x 3 days then as needed. Patient has baseline renal insufficiency and Cr baseline. Will need repeat Cr in a week.      Drenda Freeze, MD 10/29/18 [phone]

## 2018-10-31 ENCOUNTER — Telehealth: Payer: Self-pay | Admitting: Internal Medicine

## 2018-10-31 ENCOUNTER — Encounter: Payer: Self-pay | Admitting: Family Medicine

## 2018-10-31 ENCOUNTER — Ambulatory Visit (INDEPENDENT_AMBULATORY_CARE_PROVIDER_SITE_OTHER): Payer: BLUE CROSS/BLUE SHIELD | Admitting: Family Medicine

## 2018-10-31 VITALS — BP 126/62 | HR 62 | Temp 98.1°F | Wt 221.0 lb

## 2018-10-31 DIAGNOSIS — E1122 Type 2 diabetes mellitus with diabetic chronic kidney disease: Secondary | ICD-10-CM | POA: Diagnosis not present

## 2018-10-31 DIAGNOSIS — R413 Other amnesia: Secondary | ICD-10-CM

## 2018-10-31 DIAGNOSIS — Z794 Long term (current) use of insulin: Secondary | ICD-10-CM

## 2018-10-31 DIAGNOSIS — N183 Chronic kidney disease, stage 3 unspecified: Secondary | ICD-10-CM

## 2018-10-31 DIAGNOSIS — I214 Non-ST elevation (NSTEMI) myocardial infarction: Secondary | ICD-10-CM

## 2018-10-31 DIAGNOSIS — I10 Essential (primary) hypertension: Secondary | ICD-10-CM | POA: Diagnosis not present

## 2018-10-31 DIAGNOSIS — I509 Heart failure, unspecified: Secondary | ICD-10-CM

## 2018-10-31 DIAGNOSIS — R1310 Dysphagia, unspecified: Secondary | ICD-10-CM | POA: Diagnosis not present

## 2018-10-31 NOTE — Progress Notes (Signed)
Subjective:    Patient ID: William [facility], male    DOB: December 12, 1950, 68 y.o.   MRN: [id number]  No chief complaint on file. Pt is accompanied by his daughter [place], who is in town to help her father take care of himself.  HPI Patient was seen today for f/u.  Pt hospitalized 12/19-12/24 for NSTEMI.  Nephrology consulted for CKD III.  Pt given Mucomyst and IVF prior to cardiac cath as contrast required.  Pt s/p cardiac cath on 12/23 with 70% diffuse dx in LAD, ostilal 90% stenosis in D1, patent prox diag 1 stent, LCx with Large OM1 patent prior stent, RCA 100% occluded, Partially successful PTCA attempt 100-99% stenosis.   Procedure stopped give low chances of succussful PCI without large amount of contrast.  DAPT with ASA and Brilinta advised, statin, nitrates, beta blocker.  ECHO with grade 1 diastolic dysfunction, EF 09-32%.    Pt went to the ED on 10/29/2018 for CHF exacerbation.  He was given 40 mg IV lasix in ED and advised to f/u with Cardiology.    Pt told to take lasix 20 mg po x 3 days then prn given h/o CKD 3.    DM: -novolog d/c'd. -taking reduced amount of tresiba.   -pt has yet to f/u with Endocrinology.  HTN: -valsartan d/c'd 2/2 elevated creatinine -pt taking hydralazine 100 mg TID, norvasc, beta blocker, and nitrates  Dysphagia: -x 2 wks -pt notes difficulty swallowing solids since being in the hospital -no trouble with liquids -pt states he has been taking omeprazole 20 mg -notes decreased appetite since hospitalization. -endorses chronic cough at times with bloody sputum -denies reflux, abdominal pain, n/v  Memory loss: -scheduled to have neuropsych testing in a few wks.  Pt states he was unaware of calls to schedule multiple specialist appointments.  Pt's daughter inquires about HH.  Informed pt declined [place] after a few visits.  Past Medical History:  Diagnosis Date  . Anemia   . CKD (chronic kidney disease) stage 3, GFR 30-59 ml/min (HCC)   . Coronary artery  disease   . Diabetic peripheral neuropathy ([place])   . GERD (gastroesophageal reflux disease)   . Hyperlipidemia   . Hypertension   . Hypertensive crisis 10/16/2018  . Noncompliance with medication regimen   . Osteoarthritis    "legs, back" (10/16/2018)  . Pneumonia 01/27/2016   "real bad; I died and they had to bring me back" (10/16/2018)  . Seasonal allergies   . Type II diabetes mellitus (HCC)     No Known Allergies  ROS General: Denies fever, chills, night sweats, changes in weight, changes in appetite HEENT: Denies headaches, ear pain, changes in vision, rhinorrhea, sore throat CV: Denies CP, palpitations, SOB, orthopnea Pulm: Denies SOB, wheezing  +chronic cough GI: Denies abdominal pain, nausea, vomiting, diarrhea, constipation  +dysphagia GU: Denies dysuria, hematuria, frequency, vaginal discharge Msk: Denies muscle cramps, joint pains Neuro: Denies weakness, numbness, tingling Skin: Denies rashes, bruising Psych: Denies depression, anxiety, hallucinations   +memory deficit.    Objective:    Blood pressure 126/62, pulse 62, temperature 98.1 F (36.7 C), temperature source Oral, weight 221 lb (100.2 kg), SpO2 96 %.  Gen. Pleasant, well-nourished, in no distress, normal affect  HEENT: [place]/AT, face symmetric, no scleral icterus, PERRLA, EOMI, nares patent without drainage Lungs: no accessory muscle use, CTAB, no wheezes or rales Cardiovascular: RRR, no m/r/g, no peripheral edema Abdomen: BS present, soft, NT/ND Neuro:  A&Ox3, CN II-XII intact, normal gait Skin:  Warm,  no lesions/ rash  Wt Readings from Last 3 Encounters:  10/21/18 217 lb 1.6 oz (98.5 kg)  09/19/18 216 lb (98 kg)  08/20/18 216 lb (98 kg)    Lab Results  Component Value Date   WBC 7.4 10/29/2018   HGB 9.3 (L) 10/29/2018   HCT 29.1 (L) 10/29/2018   PLT 289 10/29/2018   GLUCOSE 186 (H) 10/29/2018   CHOL 162 03/06/2018   TRIG 95.0 03/06/2018   HDL 37.60 (L) 03/06/2018   LDLCALC 105 (H) 03/06/2018    ALT 11 10/16/2018   AST 16 10/16/2018   NA 133 (L) 10/29/2018   K 4.5 10/29/2018   CL 100 10/29/2018   CREATININE 2.73 (H) 10/29/2018   BUN 26 (H) 10/29/2018   CO2 23 10/29/2018   INR 1.07 11/19/2017   HGBA1C 6.6 (H) 10/21/2018    Assessment/Plan:  NSTEMI (non-ST elevated myocardial infarction) (HCC)  -continue Imdur 15 mg daily, lopressor 100 mg BID, hydralazine 100 mg TID, norvasc 10 mg, rosuvastatin 20 mg daily -pt encouraged to keep f/u with Cardiology -the importance of lifestyle modifications encouraged. - Plan: CBC with Differential/Platelet  Essential hypertension  -controlled -continue norvasc 10 mg, hydralazine 100 mg TID, lopressor 100 mg BID, Imdur 15 mg -encouraged to check bp at home -pt to keep f/u with Cardiology - Plan: Basic metabolic panel  Type 2 diabetes mellitus with stage 3 chronic kidney disease, with long-term current use of insulin ([place])  -continue tresiba 5 units daily.  Do not use if fsbs <150 -lifestyle modifications encouraged -pt encouraged to contact Endocrinology to make f/u appt. -referral for diabetic retinopathy screening -pt to contact Nephrology in regards to previously placed referral. - Plan: Basic metabolic panel, Ambulatory referral to Ophthalmology  Dysphagia, unspecified type  -continue omeprazole 20 mg daily.   -consider increasing dose to 40 mg daily, however hesitant to make further changes in regimen to avoid confusion. - Plan: Ambulatory referral to Gastroenterology  Acute congestive heart failure, unspecified heart failure type ([place])  -continue lasix 20 mg for the next few days as prescribed by the ED. -pt encouraged to limit sodium intake and elevate LEs when sitting. - Plan: Brain Natriuretic Peptide  Memory deficit -Neuropsych appt cancelled per referral note as provider no longer at the practice. -will look for other providers in the area that offer testing  F/u in the next 2-4 wks  [name], MD

## 2018-10-31 NOTE — Patient Instructions (Signed)
Dysphagia  Dysphagia is trouble swallowing. This condition occurs when solids and liquids stick in a person's throat on the way down to the stomach, or when food takes longer to get to the stomach. You may have problems swallowing food, liquids, or both. You may also have pain while trying to swallow. It may take you more time and effort to swallow something. What are the causes? This condition is caused by:  Problems with the muscles. They may make it difficult for you to move food and liquids through the tube that connects your mouth to your stomach (esophagus). You may have ulcers, scar tissue, or inflammation that blocks the normal passage of food and liquids. Causes of these problems include: ? Acid reflux from your stomach into your esophagus (gastroesophageal reflux). ? Infections. ? Radiation treatment for cancer. ? Medicines taken without enough fluids to wash them down into your stomach.  Nerve problems. These prevent signals from being sent to the muscles of your esophagus to squeeze (contract) and move what you swallow down to your stomach.  Globus pharyngeus. This is a common problem that involves feeling like something is stuck in the throat or a sense of trouble with swallowing even though nothing is wrong with the swallowing passages.  Stroke. This can affect the nerves and make it difficult to swallow.  Certain conditions, such as cerebral palsy or Parkinson disease. What are the signs or symptoms? Common symptoms of this condition include:  A feeling that solids or liquids are stuck in your throat on the way down to the stomach.  Food taking too long to get to the stomach. Other symptoms include:  Food moving back from your stomach to your mouth (regurgitation).  Noises coming from your throat.  Chest discomfort with swallowing.  A feeling of fullness when swallowing.  Drooling, especially when the throat is blocked.  Pain while  swallowing.  Heartburn.  Coughing or gagging while trying to swallow. How is this diagnosed? This condition is diagnosed by:  Barium X-ray. In this test, you swallow a white substance (contrast medium)that sticks to the inside of your esophagus. X-ray images are then taken.  Endoscopy. In this test, a flexible telescope is inserted down your throat to look at your esophagus and your stomach.  CT scans and MRI. How is this treated? Treatment for dysphagia depends on the cause of the condition:  If the dysphagia is caused by acid reflux or infection, medicines may be used. They may include antibiotics and heartburn medicines.  If the dysphagia is caused by problems with your muscles, swallowing therapy may be used to help you strengthen your swallowing muscles. You may have to do specific exercises to strengthen the muscles or stretch them.  If the dysphagia is caused by a blockage or mass, procedures to remove the blockage may be done. You may need surgery and a feeding tube. You may need to make diet changes. Ask your health care provider for specific instructions. Follow these instructions at home: Eating and drinking  Try to eat soft food that is easier to swallow.  Follow any diet changes as told by your health care provider.  Cut your food into small pieces and eat slowly.  Eat and drink only when you are sitting upright.  Do not drink alcohol or caffeine. If you need help quitting, ask your health care provider. General instructions  Check your weight every day to make sure you are not losing weight.  Take over-the-counter and prescription medicines only  as told by your health care provider.  If you were prescribed an antibiotic medicine, take it as told by your health care provider. Do not stop taking the antibiotic even if you start to feel better.  Do not use any products that contain nicotine or tobacco, such as cigarettes and e-cigarettes. If you need help  quitting, ask your health care provider.  Keep all follow-up visits as told by your health care provider. This is important. Contact a health care provider if:  You lose weight because you cannot swallow.  You cough when you drink liquids (aspiration).  You cough up partially digested food. Get help right away if:  You cannot swallow your saliva.  You have shortness of breath or a fever, or both.  You have a hoarse voice and also have trouble swallowing. Summary  Dysphagia is trouble swallowing. This condition occurs when solids and liquids stick in a person's throat on the way down to the stomach, or when food takes longer to get to the stomach.  Dysphagia has many possible causes and symptoms.  Treatment for dysphagia depends on the cause of the condition. This information is not intended to replace advice given to you by your health care provider. Make sure you discuss any questions you have with your health care provider. Document Released: 10/12/2000 Document Revised: 10/04/2016 Document Reviewed: 10/04/2016 Elsevier Interactive Patient Education  2019 Elsevier Inc.  Chronic Kidney Disease, Adult Chronic kidney disease (CKD) occurs when the kidneys become damaged slowly over a long period of time. The kidneys are a pair of organs that do many important jobs in the body, including:  Removing waste and extra fluid from the blood to make urine.  Making hormones that maintain the amount of fluid in tissues and blood vessels.  Maintaining the right amount of fluids and chemicals in the body. A small amount of kidney damage may not cause problems, but a large amount of damage may make it hard or impossible for the kidneys to work the way they should. If steps are not taken to slow down kidney damage or to stop it from getting worse, the kidneys may stop working permanently (end-stage renal disease or ESRD). Most of the time, CKD does not go away, but it can often be controlled.  People who have CKD are usually able to live normal lives. What are the causes? The most common causes of this condition are diabetes and high blood pressure (hypertension). Other causes include:  Heart and blood vessel (cardiovascular) disease.  Kidney diseases, such as: ? Glomerulonephritis. ? Interstitial nephritis. ? Polycystic kidney disease. ? Renal vascular disease.  Diseases that affect the immune system.  Genetic diseases.  Medicines that damage the kidneys, such as anti-inflammatory medicines.  Being around or being in contact with poisonous (toxic) substances.  A kidney or urinary infection that occurs again and again (recurs).  Vasculitis. This is swelling or inflammation of the blood vessels.  A problem with urine flow that may be caused by: ? Cancer. ? Having kidney stones more than one time. ? An enlarged prostate, in males. What increases the risk? You are more likely to develop this condition if you:  Are older than age 44.  Are male.  Are African-American, Hispanic, Asian, [place], or American [place].  Are a current or former smoker.  Are obese.  Have a family history of kidney disease or failure.  Often take medicines that are damaging to the kidneys. What are the signs or symptoms? Symptoms  of this condition include:  Swelling (edema) of the face, legs, ankles, or feet.  Tiredness (lethargy) and having less energy.  Nausea or vomiting.  Confusion or trouble concentrating.  Problems with urination, such as: ? Painful or burning feeling during urination. ? Decreased urine production. ? Frequent urination, especially at night. ? Bloody urine.  Muscle twitches and cramps, especially in the legs.  Shortness of breath.  Weakness.  Loss of appetite.  Metallic taste in the mouth.  Trouble sleeping.  Dry, itchy skin.  A low blood count (anemia).  Pale lining of the eyelids and surface of the eye  (conjunctiva). Symptoms develop slowly and may not be obvious until the kidney damage becomes severe. It is possible to have kidney disease for years without having any symptoms. How is this diagnosed? This condition may be diagnosed based on:  Blood tests.  Urine tests.  Imaging tests, such as an ultrasound or CT scan.  A test in which a sample of tissue is removed from the kidneys to be examined under a microscope (kidney biopsy). These test results will help your health care provider determine how serious the CKD is. How is this treated? There is no cure for most cases of this condition, but treatment usually relieves symptoms and prevents or slows the progression of the disease. Treatment may include:  Making diet changes, which may require you to avoid alcohol, salty foods (sodium), and foods that are high in potassium, calcium, and protein.  Medicines: ? To lower blood pressure. ? To control blood glucose. ? To relieve anemia. ? To relieve swelling. ? To protect your bones. ? To improve the balance of electrolytes in your blood.  Removing toxic waste from the body through types of dialysis, if the kidneys can no longer do their job (kidney failure).  Managing any other conditions that are causing your CKD or making it worse. Follow these instructions at home: Medicines  Take over-the-counter and prescription medicines only as told by your health care provider. The dose of some medicines that you take may need to be adjusted.  Do not take any new medicines unless approved by your health care provider. Many medicines can worsen your kidney damage.  Do not take any vitamin and mineral supplements unless approved by your health care provider. Many nutritional supplements can worsen your kidney damage. General instructions  Follow your prescribed diet as told by your health care provider.  Do not use any products that contain nicotine or tobacco, such as cigarettes and  e-cigarettes. If you need help quitting, ask your health care provider.  Monitor and track your blood pressure at home. Report changes in your blood pressure as told by your health care provider.  If you are being treated for diabetes, monitor and track your blood sugar (blood glucose) levels as told by your health care provider.  Maintain a healthy weight. If you need help with this, ask your health care provider.  Start or continue an exercise plan. Exercise at least 30 minutes a day, 5 days a week.  Keep your immunizations up to date as told by your health care provider.  Keep all follow-up visits as told by your health care provider. This is important. Where to find more information  American Association of Kidney Patients: BombTimer.gl  National Kidney Foundation: www.kidney.[place]: https://mathis.com/  [facility]: www.lifeoptions.org and www.kidneyschool.org Contact a health care provider if:  Your symptoms get worse.  You develop new symptoms. Get help  right away if:  You develop symptoms of ESRD, which include: ? Headaches. ? Numbness in the hands or feet. ? Easy bruising. ? Frequent hiccups. ? Chest pain. ? Shortness of breath. ? Lack of menstruation, in women.  You have a fever.  You have decreased urine production.  You have pain or bleeding when you urinate. Summary  Chronic kidney disease (CKD) occurs when the kidneys become damaged slowly over a long period of time.  The most common causes of this condition are diabetes and high blood pressure (hypertension).  There is no cure for most cases of this condition, but treatment usually relieves symptoms and prevents or slows the progression of the disease. Treatment may include a combination of medicines and lifestyle changes. This information is not intended to replace advice given to you by your health care provider. Make sure you discuss any questions you have with  your health care provider. Document Released: 07/24/2008 Document Revised: 11/22/2016 Document Reviewed: 11/22/2016 Elsevier Interactive Patient Education  2019 [employer].

## 2018-10-31 NOTE — Telephone Encounter (Signed)
Patients Daughter called to schedule new patient appointment for the patient. Patient has cancelled and no showed prior appts. I let the daughter know that I could schedule him but if he could not make it to this appt(cancelled and doesn't reshedule) or no shows we wouldn't be able to schedule the patient again. She stated she understood and that the patient would be at the appointment.

## 2018-11-03 ENCOUNTER — Encounter: Payer: Self-pay | Admitting: Gastroenterology

## 2018-11-03 ENCOUNTER — Encounter: Payer: Medicare HMO | Admitting: Psychology

## 2018-11-04 ENCOUNTER — Other Ambulatory Visit: Payer: Self-pay | Admitting: Family Medicine

## 2018-11-04 ENCOUNTER — Institutional Professional Consult (permissible substitution): Payer: BLUE CROSS/BLUE SHIELD | Admitting: Internal Medicine

## 2018-11-04 ENCOUNTER — Telehealth: Payer: Self-pay | Admitting: Family Medicine

## 2018-11-04 NOTE — Telephone Encounter (Signed)
This Rx was Discontinued at the ED on 10/21/2018

## 2018-11-04 NOTE — Telephone Encounter (Signed)
Copied from [place] [phone]. Topic: Quick Communication - Rx Refill/Question >> Nov 04, 2018  3:58 PM Alanda Slim E wrote: Medication: Pt has the [employer] 14 day sensor and the [employer] Glucose Monitoring System. He is requesting the refills for these systems. He does not need the actual monitors just the refill kit. Pt has had script with Previous provider from [place] but needs one from Dr. Volanda Napoleon   Has the patient contacted their pharmacy? No   Preferred Pharmacy (with phone number or street name): [phone](Phone) [phone](Fax)    Agent: Please be advised that RX refills may take up to 3 business days. We ask that you follow-up with your pharmacy.

## 2018-11-06 ENCOUNTER — Other Ambulatory Visit: Payer: Self-pay | Admitting: Family Medicine

## 2018-11-06 MED ORDER — FREESTYLE LIBRE 14 DAY SENSOR MISC: 1.0000 | Freq: Every day | 0 refills | Status: DC

## 2018-11-06 NOTE — ED Provider Notes (Signed)
[facility] EMERGENCY DEPARTMENT Provider Note   CSN: [id number] Arrival date & time: 10/29/18  February 07, 1304     History   Chief Complaint Chief Complaint  Patient presents with  . Hypoglycemia    HPI William Webb is a 68 y.o. male.  The history is provided by the patient and a friend.  Hypoglycemia  Associated symptoms: weakness   Weakness  Severity:  Mild Onset quality:  Gradual Timing:  Constant Chronicity:  New Relieved by:  None tried Worsened by:  Nothing Ineffective treatments:  None tried Associated symptoms: nausea   Associated symptoms: no abdominal pain, no cough, no falls, no fever and no near-syncope     Past Medical History:  Diagnosis Date  . Anemia   . CKD (chronic kidney disease) stage 3, GFR 30-59 ml/min (HCC)   . Coronary artery disease   . Diabetic peripheral neuropathy ([place])   . GERD (gastroesophageal reflux disease)   . Hyperlipidemia   . Hypertension   . Hypertensive crisis 10/16/2018  . Noncompliance with medication regimen   . Osteoarthritis    "legs, back" (10/16/2018)  . Pneumonia 07-Feb-2016   "real bad; I died and they had to bring me back" (10/16/2018)  . Seasonal allergies   . Type II diabetes mellitus [facility])     Patient Active Problem List   Diagnosis Date Noted  . NSTEMI (non-ST elevated myocardial infarction) ([place]) 10/16/2018  . Hypertensive crisis 10/16/2018  . Hypertension   . Hyperlipidemia   . Diabetes mellitus with renal complications ([place])   . Noncompliance with medication regimen   . Chronic pain syndrome 05/29/2018  . Type 2 diabetes mellitus with stage 3 chronic kidney disease, with long-term current use of insulin ([place]) 04/07/2018  . CKD (chronic kidney disease) stage 3, GFR 30-59 ml/min (HCC) 03/11/2018  . Accident due to mechanical fall without injury 11/20/2017  . AKI (acute kidney injury) ([place]) 11/20/2017  . Anemia 11/20/2017  . Hypoglycemia 11/19/2017  . Essential hypertension 11/19/2017     Past Surgical History:  Procedure Laterality Date  . CARDIAC CATHETERIZATION  10/20/2018  . CORONARY BALLOON ANGIOPLASTY N/A 10/20/2018   Procedure: CORONARY BALLOON ANGIOPLASTY;  Surgeon: Nigel Mormon, MD;  Location: [facility];  Service: Cardiovascular;  Laterality: N/A;  . JOINT REPLACEMENT    . LEFT HEART CATH AND CORONARY ANGIOGRAPHY N/A 10/20/2018   Procedure: LEFT HEART CATH AND CORONARY ANGIOGRAPHY;  Surgeon: Nigel Mormon, MD;  Location: [facility];  Service: Cardiovascular;  Laterality: N/A;  . TOTAL KNEE ARTHROPLASTY Right         Home Medications    Prior to Admission medications   Medication Sig Start Date End Date Taking? Authorizing Provider  acetaminophen (TYLENOL) 500 MG tablet Take 1,000 mg by mouth 2 (two) times daily.     [provider]  amLODipine (NORVASC) 10 MG tablet Take 1 tablet (10 mg total) by mouth daily. 10/21/18   Bonnielee Haff, MD  aspirin EC 81 MG tablet Take 1 tablet (81 mg total) by mouth daily. 03/06/18   Billie Ruddy, MD  cholecalciferol (VITAMIN D) 25 MCG (1000 UT) tablet Take 1,000 Units by mouth daily.     [provider]  Continuous Blood Gluc Sensor (FREESTYLE LIBRE 14 DAY SENSOR) MISC 1 each by Does not apply route daily. 11/06/18   Billie Ruddy, MD  Cyanocobalamin 2500 MCG CHEW Chew 2,500 mcg by mouth daily.    [provider]  furosemide (  LASIX) 20 MG tablet TAKE 1 TABLET BY MOUTH ONCE DAILY Patient taking differently: Take 20 mg by mouth daily as needed for fluid.  10/10/18   Billie Ruddy, MD  gabapentin (NEURONTIN) 600 MG tablet Take 2 tablets (1,200 mg total) by mouth 3 (three) times daily. 05/29/18 08/05/26  Jamse Arn, MD  hydrALAZINE (APRESOLINE) 100 MG tablet Take 1 tablet (100 mg total) by mouth every 8 (eight) hours. 10/21/18   Bonnielee Haff, MD  Hyprom-Naphaz-Polysorb-Zn Sulf (CLEAR EYES COMPLETE OP) Place 4-5 drops into both eyes daily as needed (dry  eyes).    [provider]  insulin degludec (TRESIBA FLEXTOUCH) 100 UNIT/ML SOPN FlexTouch Pen Inject 0.05 mLs (5 Units total) into the skin daily. DO NOT TAKE IF CBG'S ARE LESS THAN 150 10/23/18   Billie Ruddy, MD  Insulin Pen Needle (BD PEN NEEDLE NANO U/F) 32G X 4 MM MISC USE AS DIRECTED FOR TRESIBA INJECTIONS ONCE DAILY 10/24/18   Billie Ruddy, MD  isosorbide mononitrate (IMDUR) 30 MG 24 hr tablet Take 0.5 tablets (15 mg total) by mouth daily. 10/22/18   Bonnielee Haff, MD  methocarbamol (ROBAXIN) 500 MG tablet TAKE 1 TABLET BY MOUTH TWICE DAILY AS NEEDED FOR MUSCLE SPASM Patient taking differently: Take 500 mg by mouth 2 (two) times daily as needed for muscle spasms.  08/19/18   Jamse Arn, MD  metoprolol tartrate (LOPRESSOR) 100 MG tablet Take 1 tablet (100 mg total) by mouth 2 (two) times daily. 03/06/18   Billie Ruddy, MD  montelukast (SINGULAIR) 10 MG tablet Take 1 tablet (10 mg total) by mouth at bedtime. Patient taking differently: Take 10 mg by mouth every morning.  03/06/18   Billie Ruddy, MD  omeprazole (PRILOSEC) 20 MG capsule Take 20 mg by mouth daily.     [provider]  rosuvastatin (CRESTOR) 20 MG tablet Take 1 tablet (20 mg total) by mouth daily at 6 PM. 10/21/18   Bonnielee Haff, MD  ticagrelor (BRILINTA) 90 MG TABS tablet Take 1 tablet (90 mg total) by mouth 2 (two) times daily. 10/21/18   Bonnielee Haff, MD  vitamin E (VITAMIN E) 1000 UNIT capsule Take 1,000 Units by mouth daily.     [provider]    Family History Family History  Problem Relation Age of Onset  . Hypertension Mother   . Diabetes Mother   . Hyperlipidemia Mother   . Hypertension Father   . Hypertension Sister   . Cancer Sister     Social History Social History   Tobacco Use  . Smoking status: Former Smoker    Packs/day: 0.33    Years: 20.00    Pack years: 6.60    Types: Cigarettes    Last attempt to quit: 1985    Years since quitting: 35.0   . Smokeless tobacco: Never Used  Substance Use Topics  . Alcohol use: Yes    Comment: 10/16/2018 "couple drinks/year"  . Drug use: Never     Allergies   Patient has no known allergies.   Review of Systems Review of Systems  Constitutional: Negative for fever.  Respiratory: Negative for cough.   Cardiovascular: Negative for near-syncope.  Gastrointestinal: Positive for nausea. Negative for abdominal pain.  Musculoskeletal: Negative for falls.  Neurological: Positive for weakness.  All other systems reviewed and are negative.    Physical Exam Updated Vital Signs BP (!) 186/92   Pulse 73   Temp 98.9 F (37.2 C) (Oral)   Resp Marland Kitchen)  23   SpO2 (!) 89%   Physical Exam Vitals signs and nursing note reviewed.  Constitutional:      Appearance: He is well-developed.  HENT:     Head: Normocephalic and atraumatic.     Nose: Nose normal. No congestion or rhinorrhea.     Mouth/Throat:     Mouth: Mucous membranes are moist.  Eyes:     Extraocular Movements: Extraocular movements intact.     Conjunctiva/sclera: Conjunctivae normal.     Pupils: Pupils are equal, round, and reactive to light.  Neck:     Musculoskeletal: Normal range of motion.  Cardiovascular:     Rate and Rhythm: Normal rate.  Pulmonary:     Effort: Pulmonary effort is normal. No respiratory distress.  Abdominal:     General: There is no distension.  Musculoskeletal: Normal range of motion.  Skin:    General: Skin is warm and dry.  Neurological:     General: No focal deficit present.     Mental Status: He is alert.      ED Treatments / Results  Labs (all labs ordered are listed, but only abnormal results are displayed) Labs Reviewed  BASIC METABOLIC PANEL - Abnormal; Notable for the following components:      Result Value   Sodium 133 (*)    Glucose, Bld 186 (*)    BUN 26 (*)    Creatinine, Ser 2.73 (*)    Calcium 7.8 (*)    GFR calc non Af Amer 23 (*)    GFR calc Af Amer 26 (*)    All other  components within normal limits  CBC - Abnormal; Notable for the following components:   RBC 3.35 (*)    Hemoglobin 9.3 (*)    HCT 29.1 (*)    All other components within normal limits  URINALYSIS, ROUTINE W REFLEX MICROSCOPIC - Abnormal; Notable for the following components:   Glucose, UA 150 (*)    Protein, ur >=300 (*)    All other components within normal limits  TROPONIN I - Abnormal; Notable for the following components:   Troponin I 0.05 (*)    All other components within normal limits  BRAIN NATRIURETIC PEPTIDE - Abnormal; Notable for the following components:   B Natriuretic Peptide 776.7 (*)    All other components within normal limits  CBG MONITORING, ED - Abnormal; Notable for the following components:   Glucose-Capillary 158 (*)    All other components within normal limits    EKG EKG Interpretation  Date/Time:  Wednesday October 29 2018 13:26:22 EST Ventricular Rate:  65 PR Interval:  178 QRS Duration: 98 QT Interval:  444 QTC Calculation: 461 R Axis:   39 Text Interpretation:  Normal sinus rhythm Minimal voltage criteria for LVH, may be normal variant Septal infarct , age undetermined T wave abnormality, consider lateral ischemia Abnormal ECG improving LVH No acute changes Confirmed by Merrily Pew [phone]) on 10/29/2018 2:03:04 PM   Radiology No results found.  Procedures Procedures (including critical care time)  Medications Ordered in ED Medications  lactated ringers bolus 1,000 mL (0 mLs Intravenous Stopped 10/29/18 1901)  benzonatate (TESSALON) capsule 100 mg (100 mg Oral Given 10/29/18 1758)  furosemide (LASIX) injection 40 mg (40 mg Intravenous Given 10/29/18 1856)     Initial Impression / Assessment and Plan / ED Course  I have reviewed the triage vital signs and the nursing notes.  Pertinent labs & imaging results that were available during my care of the  patient were reviewed by me and considered in my medical decision making (see chart for  details).     Patient here with weakness after recently discharged in the hospital with an STEMI.  I suspect patient's weakness is likely from heart failure and the recent cardiac issues so we will get delta troponins to make sure they are not going up think they are just coming down from his previous and STEMI.  He is already in cardiac rehab needs to continue with that.  Can also maybe get some help at home.  Care to dr. Darl Householder pending repeat labs and likely discharge.   Final Clinical Impressions(s) / ED Diagnoses   Final diagnoses:  Congestive heart failure, unspecified HF chronicity, unspecified heart failure type [facility])  Renal insufficiency  Dizziness    ED Discharge Orders    None       Muhamad Serano, Corene Cornea, MD 11/06/18 1553

## 2018-11-06 NOTE — Telephone Encounter (Signed)
New Rx for glucose Libre sensor was sent to pt pharmacy

## 2018-11-07 ENCOUNTER — Encounter: Payer: Self-pay | Admitting: Gastroenterology

## 2018-11-07 ENCOUNTER — Ambulatory Visit (INDEPENDENT_AMBULATORY_CARE_PROVIDER_SITE_OTHER): Payer: BLUE CROSS/BLUE SHIELD | Admitting: Gastroenterology

## 2018-11-07 ENCOUNTER — Telehealth: Payer: Self-pay

## 2018-11-07 VITALS — BP 128/50 | HR 47 | Ht 75.0 in | Wt 225.0 lb

## 2018-11-07 DIAGNOSIS — K219 Gastro-esophageal reflux disease without esophagitis: Secondary | ICD-10-CM

## 2018-11-07 DIAGNOSIS — K5900 Constipation, unspecified: Secondary | ICD-10-CM

## 2018-11-07 DIAGNOSIS — R1310 Dysphagia, unspecified: Secondary | ICD-10-CM | POA: Diagnosis not present

## 2018-11-07 DIAGNOSIS — R001 Bradycardia, unspecified: Secondary | ICD-10-CM

## 2018-11-07 MED ORDER — LINACLOTIDE 290 MCG PO CAPS: 290.0000 ug | ORAL_CAPSULE | Freq: Every day | ORAL | 0 refills | Status: DC

## 2018-11-07 NOTE — Telephone Encounter (Signed)
Patient's daughter, Shelbie Hutching  called and said the insurance is requiring a prior authorization on this. Please call patient after this is handled, thanks [phone]

## 2018-11-07 NOTE — Progress Notes (Signed)
HPI :  68 year old male with a history of recent NSTEMI s/p cardiac catheterization, CKD stage III, HTN, referred by Billie Ruddy, MD for symptoms of dysphagia.   He was admitted at the end of December for NSTEMI. Cardiac cath done 12/23 with 70% diffuse dx in LAD, ostilal 90% stenosis in D1, patent prox diag 1 stent, LCx with Large OM1 patent prior stent, RCA 100% occluded. Partially successful PTCA attempt.   Procedure stopped give low chances of succussful PCI without large amount of contrast in the setting of CKD. He was started on aspirin and Brilinta.EF 50-55%.   He is here for symptoms of dysphagia. He endorses solid food dysphagia for the past month or so. He denies any trouble with liquids or soft foods, however when he swallows solids he has a hard time getting them past his proximal esophagus. It sounds like he is able to transition the food into his esophagus and that it hangs up, although hard to discern. He denies any odynophagia. He states things tend to take a long time to go down and chewing his food quite thoroughly. He has endorse some nausea recently with some sporadic occasional vomiting. He denies any typical pyrosis but does endorse some water brash. He has been taking omeprazole 20 mm per day which is a recent addition. He thinks he's had a prior upper endoscopy over 5 years ago in [place], no report available. No family history of esophageal cancer  He also endorses chronic constipation which has been bothering him. He reports at times he has 1 bowel movement every 2 weeks. He is passing small hard stools. He was given MiraLAX in the hospital has been taking one dose a day however this has not provided much relief at all. He denies any blood in the stools. His last colonoscopy was about 5 or 6 years ago in [place], no report available. No family history of colon cancer.  He is awaiting a consultation with nephrology for CKD in light of  need for potential further  contrast for repeat cardiac catheterization with cardiology. Of note he feels fatigue, a bit lightheaded today. Pulse 47. He denies any palpitations. States he toprol was recently reduced from 200mg  / day to 100mg  / day. BP 120/50s.     Past Medical History:  Diagnosis Date  . Anemia   . CKD (chronic kidney disease) stage 3, GFR 30-59 ml/min (HCC)   . Coronary artery disease   . Diabetic peripheral neuropathy ([place])   . GERD (gastroesophageal reflux disease)   . Hyperlipidemia   . Hypertension   . Hypertensive crisis 10/16/2018  . Noncompliance with medication regimen   . Osteoarthritis    "legs, back" (10/16/2018)  . Pneumonia 2016-01-29   "real bad; I died and they had to bring me back" (10/16/2018)  . Seasonal allergies   . Type II diabetes mellitus ([place])      Past Surgical History:  Procedure Laterality Date  . CARDIAC CATHETERIZATION  10/20/2018  . CORONARY BALLOON ANGIOPLASTY N/A 10/20/2018   Procedure: CORONARY BALLOON ANGIOPLASTY;  Surgeon: Nigel Mormon, MD;  Location: [facility];  Service: Cardiovascular;  Laterality: N/A;  . JOINT REPLACEMENT    . LEFT HEART CATH AND CORONARY ANGIOGRAPHY N/A 10/20/2018   Procedure: LEFT HEART CATH AND CORONARY ANGIOGRAPHY;  Surgeon: Nigel Mormon, MD;  Location: [facility];  Service: Cardiovascular;  Laterality: N/A;  . TOTAL KNEE ARTHROPLASTY Right    Family History  Problem Relation  Age of Onset  . Hypertension Mother   . Diabetes Mother   . Hyperlipidemia Mother   . Hypertension Father   . Hypertension Sister   . Cancer Sister    Social History   Tobacco Use  . Smoking status: Former Smoker    Packs/day: 0.33    Years: 20.00    Pack years: 6.60    Types: Cigarettes    Last attempt to quit: 1985    Years since quitting: 35.0  . Smokeless tobacco: Never Used  Substance Use Topics  . Alcohol use: Yes    Comment: 10/16/2018 "couple drinks/year"  . Drug use: Never   Current Outpatient  Medications  Medication Sig Dispense Refill  . acetaminophen (TYLENOL) 500 MG tablet Take 1,000 mg by mouth 2 (two) times daily.     Marland Kitchen amLODipine (NORVASC) 10 MG tablet Take 1 tablet (10 mg total) by mouth daily. 30 tablet 0  . aspirin EC 81 MG tablet Take 1 tablet (81 mg total) by mouth daily. 90 tablet 2  . cholecalciferol (VITAMIN D) 25 MCG (1000 UT) tablet Take 1,000 Units by mouth daily.     . Continuous Blood Gluc Sensor (FREESTYLE LIBRE 14 DAY SENSOR) MISC 1 each by Does not apply route daily. 1 each 0  . Cyanocobalamin 2500 MCG CHEW Chew 2,500 mcg by mouth daily.    . furosemide (LASIX) 20 MG tablet Take 20 mg by mouth 3 (three) times daily. Two tablets in the morning and one tablet in the evening    . gabapentin (NEURONTIN) 600 MG tablet Take 2 tablets (1,200 mg total) by mouth 3 (three) times daily. 180 tablet 1  . hydrALAZINE (APRESOLINE) 100 MG tablet Take 1 tablet (100 mg total) by mouth every 8 (eight) hours. 90 tablet 1  . Hyprom-Naphaz-Polysorb-Zn Sulf (CLEAR EYES COMPLETE OP) Place 4-5 drops into both eyes daily as needed (dry eyes).    . insulin degludec (TRESIBA FLEXTOUCH) 100 UNIT/ML SOPN FlexTouch Pen Inject 0.05 mLs (5 Units total) into the skin daily. DO NOT TAKE IF CBG'S ARE LESS THAN 150 15 pen 2  . Insulin Pen Needle (BD PEN NEEDLE NANO U/F) 32G X 4 MM MISC USE AS DIRECTED FOR TRESIBA INJECTIONS ONCE DAILY 90 each 1  . isosorbide mononitrate (IMDUR) 30 MG 24 hr tablet Take 0.5 tablets (15 mg total) by mouth daily. 30 tablet 0  . methocarbamol (ROBAXIN) 500 MG tablet TAKE 1 TABLET BY MOUTH TWICE DAILY AS NEEDED FOR MUSCLE SPASM (Patient taking differently: Take 500 mg by mouth 2 (two) times daily as needed for muscle spasms. ) 60 tablet 1  . metoprolol tartrate (LOPRESSOR) 100 MG tablet Take 1 tablet (100 mg total) by mouth 2 (two) times daily. 180 tablet 1  . montelukast (SINGULAIR) 10 MG tablet Take 1 tablet (10 mg total) by mouth at bedtime. (Patient taking differently:  Take 10 mg by mouth every morning. ) 90 tablet 3  . omeprazole (PRILOSEC) 20 MG capsule Take 20 mg by mouth daily.     . rosuvastatin (CRESTOR) 20 MG tablet Take 1 tablet (20 mg total) by mouth daily at 6 PM. 30 tablet 0  . ticagrelor (BRILINTA) 90 MG TABS tablet Take 1 tablet (90 mg total) by mouth 2 (two) times daily. 60 tablet 0  . vitamin E (VITAMIN E) 1000 UNIT capsule Take 1,000 Units by mouth daily.      No current facility-administered medications for this visit.    No Known Allergies  Review of Systems: All systems reviewed and negative except where noted in HPI.   Lab Results  Component Value Date   WBC 7.4 10/29/2018   HGB 9.3 (L) 10/29/2018   HCT 29.1 (L) 10/29/2018   MCV 86.9 10/29/2018   PLT 289 10/29/2018    Lab Results  Component Value Date   CREATININE 2.73 (H) 10/29/2018   BUN 26 (H) 10/29/2018   NA 133 (L) 10/29/2018   K 4.5 10/29/2018   CL 100 10/29/2018   CO2 23 10/29/2018    Lab Results  Component Value Date   ALT 11 10/16/2018   AST 16 10/16/2018   ALKPHOS 116 10/16/2018   BILITOT 0.7 10/16/2018     Physical Exam: BP (!) 128/50   Pulse (!) 47   Ht 6\' 3"  (1.905 m)   Wt 225 lb (102.1 kg)   BMI 28.12 kg/m  Constitutional: Pleasant, male in no acute distress. HEENT: Normocephalic and atraumatic. No scleral icterus. Neck supple.  Cardiovascular: bradycardic, regular rhythm.  Pulmonary/chest: Effort normal and breath sounds normal. No wheezing, rales or rhonchi. Abdominal: Soft, nondistended, nontender. There are no masses palpable. No hepatomegaly. Extremities: no edema Lymphadenopathy: No cervical adenopathy noted. Neurological: Alert and oriented to person place and time. Skin: Skin is warm and dry. No rashes noted. Psychiatric: Normal mood and affect. Behavior is normal.   ASSESSMENT AND PLAN: 68 year old male here for new patient consultation regarding following:  Dysphagia / GERD - intermittent solid food dysphagia. This sounds  proximal esophageal in etiology given it's location and only occurring with solids, however oropharyngeal etiology also possible. I discussed ways to evaluate and treat this. He had a significant cardiac event recently which is requiring antiplatelet therapy. This will prohibit attempts at dilation of the esophagus in the near future. Recommend a barium swallow with tablet initially to get a better sense of etiology and location, and then can discuss how to treat if and when he is amenable to dilation and cleared by cardiology, although I counseled him in light of his need for further cardiac treatment, this may not be anytime soon. In the interim will increase his omeprazole to 20mg  BID for his waterbrash and see if this helps. He agreed, will relay results to him once available. Of note, have requested prior records of his EGD.  Constipation - long-standing chronic constipation. He reports of colonoscopy within the past 566 years which was okay but will obtain the report of that. I discussed options for management. MiraLAX once daily is not working well. He can try using high-dose MiraLAX or use of alternative laxative such as Linzess. He reports being on Linzess remotely in the past that worked pretty well for him. We'll give him a sample of Linzess 290 g once a day, if that works for him and its covered by insurance will prescribe for him, he will let me know.  Bradycardia - noted on exam today. BP okay. He is feeling fatigued. I suspect this is due to his toprol for which is dose was already recently decreased. I think he needs to decrease it further, however his daughter called his cardiologist's office and spoke with them about this when he was still in our office for their recommendations, defer to his cardiologist for management.   [place] Cellar, MD [facility]  CC: Billie Ruddy, MD

## 2018-11-07 NOTE — Telephone Encounter (Signed)
Pt is seen by dr Volanda Napoleon needs a prior Authorization on the Freestyle Glucose [employer] sensor

## 2018-11-07 NOTE — Telephone Encounter (Signed)
Called and LM for Speech Pathology to schedule a Modified Barrium Swallow With tablet for pt next week, hopefully Tuesday the 14th or Wednesday the 15th in the afternoon.  Call pt's daughter, [place] at [phone] with date and time for test.

## 2018-11-07 NOTE — Patient Instructions (Signed)
If you are age 69 or older, your body mass index should be between 23-30. Your Body mass index is 28.12 kg/m. If this is out of the aforementioned range listed, please consider follow up with your Primary Care Provider.  If you are age 1 or younger, your body mass index should be between 19-25. Your Body mass index is 28.12 kg/m. If this is out of the aformentioned range listed, please consider follow up with your Primary Care Provider.   We have given you samples of the following medication to take: Linzess 281mcg: Take once a day  We will request your colonoscopy and endoscopy records from [facility] in [place].  You have been scheduled for a modified barium swallow on _______ at __________. Please arrive 15 minutes prior to your test for registration. You will go to [facility] (1st Floor) for your appointment. Should you need to cancel or reschedule your appointment, please contact [phone] [place]). ________________________________________________________  A Modified Barium Swallow Study, or MBS, is a special x-ray that is taken to check swallowing skills. It is carried out by a Stage manager and a Psychologist, clinical (SLP). During this test, yourmouth, throat, and esophagus, a muscular tube which connects your mouth to your stomach, is checked. The test will help you, your doctor, and the SLP plan what types of foods and liquids are easier for you to swallow. The SLP will also identify positions and ways to help you swallow more easily and safely. What will happen during an MBS? You will be taken to an x-ray room and seated comfortably. You will be asked to swallow small amounts of food and liquid mixed with barium. Barium is a liquid or paste that allows images of your mouth, throat and esophagus to be seen on x-ray. The x-ray captures moving images of the food you are swallowing as it travels from your mouth through your throat and into your esophagus.  This test helps identify whether food or liquid is entering your lungs (aspiration). The test also shows which part of your mouth or throat lacks strength or coordination to move the food or liquid in the right direction. This test typically takes 30 minutes to 1 hour to complete. ______________________________________________   Thank you for entrusting me with your care and for choosing [facility], Dr. [place] Cellar

## 2018-11-08 ENCOUNTER — Other Ambulatory Visit: Payer: Self-pay | Admitting: Family Medicine

## 2018-11-10 ENCOUNTER — Other Ambulatory Visit: Payer: Self-pay

## 2018-11-10 ENCOUNTER — Institutional Professional Consult (permissible substitution): Payer: BLUE CROSS/BLUE SHIELD | Admitting: Internal Medicine

## 2018-11-10 DIAGNOSIS — R1310 Dysphagia, unspecified: Secondary | ICD-10-CM

## 2018-11-10 DIAGNOSIS — K219 Gastro-esophageal reflux disease without esophagitis: Secondary | ICD-10-CM

## 2018-11-10 NOTE — Progress Notes (Signed)
Scheduled pt for Barium Swallow on Wednesday, 1-15 at 11:00am. Arrive at [facility] at 10:45am.  Called daughter, sounds like "[place]" at [phone] and informed her of appt. She relayed understanding.

## 2018-11-10 NOTE — Telephone Encounter (Signed)
Dr. Havery Moros wanted a Barium Swallow w/Tablet, not modified. Updated the order and scheduled appt at [facility] for this Wednesday, 1--15 at [place] pt and informed his daughter, "William Webb" of appt.

## 2018-11-10 NOTE — Telephone Encounter (Signed)
Aisya is calling to check status of PA for [facility] sensors and states the pt is needing a new monitor for 14 day system as the one he has is for the 10 day.  Please call to advise [phone]

## 2018-11-10 NOTE — Telephone Encounter (Signed)
This Rx was discontinued at the hospital

## 2018-11-11 ENCOUNTER — Ambulatory Visit (INDEPENDENT_AMBULATORY_CARE_PROVIDER_SITE_OTHER): Payer: BLUE CROSS/BLUE SHIELD | Admitting: Family Medicine

## 2018-11-11 ENCOUNTER — Encounter: Payer: Self-pay | Admitting: Family Medicine

## 2018-11-11 VITALS — BP 130/58 | HR 53 | Temp 98.0°F | Wt 223.0 lb

## 2018-11-11 DIAGNOSIS — I10 Essential (primary) hypertension: Secondary | ICD-10-CM | POA: Diagnosis not present

## 2018-11-11 DIAGNOSIS — R600 Localized edema: Secondary | ICD-10-CM

## 2018-11-11 DIAGNOSIS — E1122 Type 2 diabetes mellitus with diabetic chronic kidney disease: Secondary | ICD-10-CM | POA: Diagnosis not present

## 2018-11-11 DIAGNOSIS — N183 Chronic kidney disease, stage 3 unspecified: Secondary | ICD-10-CM

## 2018-11-11 DIAGNOSIS — Z794 Long term (current) use of insulin: Secondary | ICD-10-CM

## 2018-11-11 NOTE — Patient Instructions (Addendum)
Edema  Edema is an abnormal buildup of fluids in the body tissues and under the skin. Swelling of the legs, feet, and ankles is a common symptom that becomes more likely as you get older. Swelling is also common in looser tissues, like around the eyes. When the affected area is squeezed, the fluid may move out of that spot and leave a dent for a few moments. This dent is called pitting edema. There are many possible causes of edema. Eating too much salt (sodium) and being on your feet or sitting for a long time can cause edema in your legs, feet, and ankles. Hot weather may make edema worse. Common causes of edema include:  Heart failure.  Liver or kidney disease.  Weak leg blood vessels.  Cancer.  An injury.  Pregnancy.  Medicines.  Being obese.  Low protein levels in the blood. Edema is usually painless. Your skin may look swollen or shiny. Follow these instructions at home:  Keep the affected body part raised (elevated) above the level of your heart when you are sitting or lying down.  Do not sit still or stand for long periods of time.  Do not wear tight clothing. Do not wear garters on your upper legs.  Exercise your legs to get your circulation going. This helps to move the fluid back into your blood vessels, and it may help the swelling go down.  Wear elastic bandages or support stockings to reduce swelling as told by your health care provider.  Eat a low-salt (low-sodium) diet to reduce fluid as told by your health care provider.  Depending on the cause of your swelling, you may need to limit how much fluid you drink (fluid restriction).  Take over-the-counter and prescription medicines only as told by your health care provider. Contact a health care provider if:  Your edema does not get better with treatment.  You have heart, liver, or kidney disease and have symptoms of edema.  You have sudden and unexplained weight gain. Get help right away if:  You develop  shortness of breath or chest pain.  You cannot breathe when you lie down.  You develop pain, redness, or warmth in the swollen areas.  You have heart, liver, or kidney disease and suddenly get edema.  You have a fever and your symptoms suddenly get worse. Summary  Edema is an abnormal buildup of fluids in the body tissues and under the skin.  Eating too much salt (sodium) and being on your feet or sitting for a long time can cause edema in your legs, feet, and ankles.  Keep the affected body part raised (elevated) above the level of your heart when you are sitting or lying down. This information is not intended to replace advice given to you by your health care provider. Make sure you discuss any questions you have with your health care provider. Document Released: 10/15/2005 Document Revised: 11/17/2016 Document Reviewed: 11/17/2016 Elsevier Interactive Patient Education  2019 [place] for Chronic Kidney Disease When your kidneys are not working well, they cannot remove waste and excess substances from your blood as effectively as they did before. This can lead to a buildup and imbalance of these substances, which can worsen kidney damage and affect how your body functions. Certain foods lead to a buildup of these substances in the body. By changing your diet as recommended by your diet and nutrition specialist (dietitian) or health care provider, you could help prevent further kidney damage and  delay or prevent the need for dialysis. What are tips for following this plan? General instructions   Work with your health care provider and dietitian to develop a meal plan that is right for you. Foods you can eat, limit, or avoid will be different for each person depending on the stage of kidney disease and any other existing health conditions.  Talk with your health care provider about whether you should take a vitamin and mineral supplement.  Use standard measuring cups  and spoons to measure servings of foods. Use a kitchen scale to measure portions of protein foods.  If directed by your health care provider, avoid drinking too much fluid. Measure and count all liquids, including water, ice, soups, flavored gelatin, and frozen desserts such as popsicles or ice cream. Reading food labels  Check the amount of sodium in foods. Choose foods that have less than 300 milligrams (mg) per serving.  Check the ingredient list for phosphorus or potassium-based additives or preservatives.  Check the amount of saturated and trans fat. Limit or avoid these fats as told by your dietitian. Shopping  Avoid buying foods that are: ? Processed, frozen, or prepackaged. ? Calcium-enriched or fortified.  Do not buy foods that have salt or sodium listed among the first five ingredients.  Do not buy canned vegetables. Cooking  Replace animal proteins, such as meat, fish, eggs, or dairy, with plant proteins from beans, nuts, and soy. ? Use soy milk instead of cow's milk. ? Add beans or tofu to soups, casseroles, or pasta dishes instead of meat.  Soak vegetables, such as potatoes, before cooking to reduce potassium. To do this: ? Peel and cut into small pieces. ? Soak in warm water for at least 2 hours. For every 1 cup of vegetables, use 10 cups of water. ? Drain and rinse with warm water. ? Boil for at least 5 minutes. Meal planning  Limit the amount of protein from plant and animal sources you eat each day.  Do not add salt to food when cooking or before eating.  Eat meals and snacks at around the same time each day. If you have diabetes:  If you have diabetes (diabetes mellitus) and chronic kidney disease, it is important to keep your blood glucose in the target range recommended by your health care provider. Follow your diabetes management plan. This may include: ? Checking your blood glucose regularly. ? Taking oral medicines, insulin, or both. ? Exercising for  at least 30 minutes on 5 or more days each week, or as told by your health care provider. ? Tracking how many servings of carbohydrates you eat at each meal.  You may be given specific guidelines on how much of certain foods and nutrients you may eat, depending on your stage of kidney disease and whether you have high blood pressure (hypertension). Follow your meal plan as told by your dietitian. What nutrients should be limited? The items listed are not a complete list. Talk with your dietitian about what dietary choices are best for you. Potassium Potassium affects how steadily your heart beats. If too much potassium builds up in your blood, it can cause an irregular heartbeat or even a heart attack. You may need to eat less potassium, depending on your blood potassium levels and the stage of kidney disease. Talk to your dietitian about how much potassium you may have each day. You may need to limit or avoid foods that are high in potassium, such as:  Milk and soy  milk.  Fruits, such as bananas, papaya, apricots, nectarines, melon, prunes, raisins, kiwi, and oranges.  Vegetables, such as potatoes, sweet potatoes, yams, tomatoes, leafy greens, beets, okra, avocado, pumpkin, and winter squash.  White and lima beans. Phosphorus Phosphorus is a mineral found in your bones. A balance between calcium and phosphorous is needed to build and maintain healthy bones. Too much phosphorus pulls calcium from your bones. This can make your bones weak and more likely to break. Too much phosphorus can also make your skin itch. You may need to eat less phosphorus depending on your blood phosphorus levels and the stage of kidney disease. Talk to your dietitian about how much potassium you may have each day. You may need to take medicine to lower your blood phosphorus levels if diet changes do not help. You may need to limit or avoid foods that are high in phosphorus, such as:  Milk and dairy products.  Dried  beans and peas.  Tofu, soy milk, and other soy-based meat replacements.  Colas.  Nuts and peanut butter.  Meat, poultry, and fish.  Bran cereals and oatmeals. Protein Protein helps you to make and keep muscle. It also helps in the repair of your body's cells and tissues. One of the natural breakdown products of protein is a waste product called urea. When your kidneys are not working properly, they cannot remove wastes, such as urea, like they did before you developed chronic kidney disease. Reducing how much protein you eat can help prevent a buildup of urea in your blood. Depending on your stage of kidney disease, you may need to limit foods that are high in protein. Sources of animal protein include:  Meat (all types).  Fish and seafood.  Poultry.  Eggs.  Dairy. Other protein foods include:  Beans and legumes.  Nuts and nut butter.  Soy and tofu. Sodium Sodium, which is found in salt, helps maintain a healthy balance of fluids in your body. Too much sodium can increase your blood pressure and have a negative effect on the function of your heart and lungs. Too much sodium can also cause your body to retain too much fluid, making your kidneys work harder. Most people should have less than 2,300 milligrams (mg) of sodium each day. If you have hypertension, you may need to limit your sodium to 1,500 mg each day. Talk to your dietitian about how much sodium you may have each day. You may need to limit or avoid foods that are high in sodium, such as:  Salt seasonings.  Soy sauce.  Cured and processed meats.  Salted crackers and snack foods.  Fast food.  Canned soups and most canned foods.  Pickled foods.  Vegetable juice.  Boxed mixes or ready-to-eat boxed meals and side dishes.  Bottled dressings, sauces, and marinades. Summary  Chronic kidney disease can lead to a buildup and imbalance of waste and excess substances in the body. Certain foods lead to a buildup  of these substances. By adjusting your intake of these foods, you could help prevent more kidney damage and delay or prevent the need for dialysis.  Food adjustments are different for each person with chronic kidney disease. Work with a dietitian to set up nutrient goals and a meal plan that is right for you.  If you have diabetes and chronic kidney disease, it is important to keep your blood glucose in the target range recommended by your health care provider. This information is not intended to replace advice given to  you by your health care provider. Make sure you discuss any questions you have with your health care provider. Document Released: 01/05/2003 Document Revised: 10/10/2016 Document Reviewed: 10/10/2016 Elsevier Interactive Patient Education  2019 [employer].

## 2018-11-11 NOTE — Telephone Encounter (Signed)
Spoke with pt daughter regarding pt order for [employer] prior [place], voiced understanding that it takes a few days for the process and soon as the approval is complete the office will notify pt

## 2018-11-11 NOTE — Progress Notes (Signed)
Subjective:    Patient ID: William Webb, male    DOB: 1951/07/14, 68 y.o.   MRN: [id number]  No chief complaint on file.   HPI Patient was seen today for follow-up.  Pt has been doing better since being on medications regularly.  Pt's daughter, William Webb still in town helping patient.  Pt has a pulmonology appointment next week.  The swelling in pt's lower extremities is improving since being on Lasix 40 mg in a.m. and 20 mg p.m.  Pt is not eating much salt and elevating his lower extremities some  Pt does endorse SOB with walking across the room.  Has Pulm appt. next wk.  DM II:  Pt taking meds and checking fsbs regularly.  Fsbs have been normal.  Pt denies hypoglycemia.  HTN:  Checking bp at home.  Taking norvasc 10 mg, lasix 40 mg in am and 20 mg in pm, hydralazine 100 mg TID, isordil 30 mg, and  toprol XL 50 mg.  Denies HAs, CP, blurred vision.  Has a barium swallow study in a few days.    Past Medical History:  Diagnosis Date  . Anemia   . CKD (chronic kidney disease) stage 3, GFR 30-59 ml/min (HCC)   . Coronary artery disease   . Diabetic peripheral neuropathy ([place])   . GERD (gastroesophageal reflux disease)   . Hyperlipidemia   . Hypertension   . Hypertensive crisis 10/16/2018  . Noncompliance with medication regimen   . Osteoarthritis    "legs, back" (10/16/2018)  . Pneumonia 02-02-16   "real bad; I died and they had to bring me back" (10/16/2018)  . Seasonal allergies   . Type II diabetes mellitus (HCC)     No Known Allergies  ROS General: Denies fever, chills, night sweats, changes in weight, changes in appetite HEENT: Denies headaches, ear pain, changes in vision, rhinorrhea, sore throat CV: Denies CP, palpitations, orthopnea  +SOB, LE edema Pulm: Denies cough, wheezing  +SOB  GI: Denies abdominal pain, nausea, vomiting, diarrhea, constipation GU: Denies dysuria, hematuria, frequency, vaginal discharge Msk: Denies muscle cramps, joint pains Neuro: Denies  weakness, numbness, tingling Skin: Denies rashes, bruising Psych: Denies depression, anxiety, hallucinations    Objective:    Blood pressure (!) 130/58, pulse (!) 53, temperature 98 F (36.7 C), temperature source Oral, weight 223 lb (101.2 kg), SpO2 96 %.  Gen. Pleasant, well-nourished, in no distress, normal affect   HEENT: [place]/AT, face symmetric, no scleral icterus, PERRLA, EOMI, nares patent without drainage, pharynx without erythema or exudate. Lungs: no accessory muscle use, CTAB, no wheezes or rales Cardiovascular: RRR, no m/r/g, trace edema in ankles Abdomen: BS present, soft, NT/ND Neuro:  A&Ox3, CN II-XII intact, normal gait Skin:  Warm, no lesions/ rash.  Skin of LEs b/l dry   Wt Readings from Last 3 Encounters:  11/11/18 223 lb (101.2 kg)  11/07/18 225 lb (102.1 kg)  10/31/18 221 lb (100.2 kg)    Lab Results  Component Value Date   WBC 7.4 10/29/2018   HGB 9.3 (L) 10/29/2018   HCT 29.1 (L) 10/29/2018   PLT 289 10/29/2018   GLUCOSE 186 (H) 10/29/2018   CHOL 162 03/06/2018   TRIG 95.0 03/06/2018   HDL 37.60 (L) 03/06/2018   LDLCALC 105 (H) 03/06/2018   ALT 11 10/16/2018   AST 16 10/16/2018   NA 133 (L) 10/29/2018   K 4.5 10/29/2018   CL 100 10/29/2018   CREATININE 2.73 (H) 10/29/2018   BUN 26 (H) 10/29/2018  CO2 23 10/29/2018   INR 1.07 11/19/2017   HGBA1C 6.6 (H) 10/21/2018    Assessment/Plan:  Bilateral leg edema -Improving -Rx given for TED hose -Discussed limiting sodium, moving more, elevating feet when resting -Given handout -pt/daughter to contact [facility] about resuming PT.  Type 2 diabetes mellitus with stage 3 chronic kidney disease, with long-term current use of insulin ([place]) -continue tresiba  -continue lifestyle modifications -last hgb A1c 6.6 on 10/21/18  Essential hypertension -elevated 130/58 -continue current meds -discussed increasing physical activity -continue making dietary changes.  F/u prn in the next few months.  [name], MD

## 2018-11-12 ENCOUNTER — Telehealth: Payer: Self-pay | Admitting: Gastroenterology

## 2018-11-12 ENCOUNTER — Ambulatory Visit (HOSPITAL_COMMUNITY): Admission: RE | Admit: 2018-11-12 | Payer: BLUE CROSS/BLUE SHIELD | Source: Ambulatory Visit

## 2018-11-12 ENCOUNTER — Ambulatory Visit: Payer: BLUE CROSS/BLUE SHIELD | Admitting: Internal Medicine

## 2018-11-12 NOTE — Telephone Encounter (Signed)
Pt's daughter Conley Rolls needs to r/s barium swallow that is scheduled for today at 11:00am. She states that pt is not feeling well and is requesting another date for appt preferably in the afternoon.

## 2018-11-12 NOTE — Telephone Encounter (Signed)
Called Radiology; rescheduled pt for Barium Swallow at [facility] on Monday, January 20th at 1:00pm to arrive at 12:45pm at 1st floor registration. NPO 3 hours.  Called and notified pt's daughter, [place].

## 2018-11-13 ENCOUNTER — Telehealth: Payer: Self-pay | Admitting: Family Medicine

## 2018-11-13 ENCOUNTER — Telehealth: Payer: Self-pay | Admitting: Gastroenterology

## 2018-11-13 ENCOUNTER — Encounter: Payer: Self-pay | Admitting: Family Medicine

## 2018-11-13 NOTE — Telephone Encounter (Signed)
Copied from [place] [id number]. Topic: Quick Communication - See Telephone Encounter >> Nov 13, 2018  4:35 PM Reyne Dumas L wrote: CRM for notification. See Telephone encounter for: 11/13/18.  Pt's daughter, Levada Dy, calling.  States that the doctor was supposed to be seeing if insurance would approve the [facility] 14 day meter for the pt.  They haven't heard anything and want to know where this is in the process. Levada Dy can be reached at [phone]

## 2018-11-13 NOTE — Telephone Encounter (Signed)
Records from prior procedures arrived as outlined:  EGD 06/02/2012 - Dr. Janelle Floor - [facility] - normal esophagus, mild gastric erythema, normal duodenum  Colonoscopy 06/02/2012 - normal colon, internal hemorrhoids   Will await his barium swallow result   Jan can you please place a recall for colonoscopy 05/2022. Thanks

## 2018-11-14 ENCOUNTER — Telehealth: Payer: Self-pay

## 2018-11-14 ENCOUNTER — Ambulatory Visit: Payer: BLUE CROSS/BLUE SHIELD | Admitting: Family Medicine

## 2018-11-14 NOTE — Telephone Encounter (Signed)
Copied from [place] [id number]. Topic: Quick Communication - Home Health Verbal Orders >> Nov 13, 2018  4:56 PM Ivar Drape wrote: Caller/Agency:  Olin Hauser w/[facility] Callback Number:   [phone] Requesting OT/PT/Skilled Nursing/Social Work: The old order expired, so they need a new order Frequency:  See old order

## 2018-11-14 NOTE — Telephone Encounter (Signed)
DAUGHTER William Webb  William is calling to f/u on PA for [facility] as pt has not been able to check blood sugar for 2 weeks. She states [facility] was refaxing the PA form to the office and noted they didn't have anything from Dr. Volanda Napoleon.  She notes pt does not have a standard glucose monitor either. When he moved here he didn't bring anything else with him because he had been change to the [place] system by his old doctor but he ran out. Please advise on alternate method to check blood sugar while waiting for PA on [place] system.  Ph # [phone]

## 2018-11-14 NOTE — Telephone Encounter (Signed)
Called Olin Hauser and left a message of approval for Verbal orders as requested

## 2018-11-14 NOTE — Telephone Encounter (Signed)
Pt daughter is aware that the office is waiting on [employer] for the [employer] sensor, Notified at the pt office visit

## 2018-11-17 ENCOUNTER — Ambulatory Visit (HOSPITAL_COMMUNITY)
Admission: RE | Admit: 2018-11-17 | Discharge: 2018-11-17 | Disposition: A | Discharge status: Home / Self Care | Payer: BLUE CROSS/BLUE SHIELD | Source: Ambulatory Visit | Attending: Gastroenterology | Admitting: Gastroenterology

## 2018-11-17 ENCOUNTER — Other Ambulatory Visit: Payer: Self-pay | Admitting: Family Medicine

## 2018-11-17 DIAGNOSIS — K219 Gastro-esophageal reflux disease without esophagitis: Secondary | ICD-10-CM | POA: Diagnosis present

## 2018-11-17 DIAGNOSIS — R1310 Dysphagia, unspecified: Secondary | ICD-10-CM | POA: Insufficient documentation

## 2018-11-17 NOTE — Telephone Encounter (Signed)
Called pt daughter left a message to pick up a meter in the office to use meanwhile when we wait for the [place] for the [place] sensor, this option was given to the pt at the visit but daughter declined stated that she did not want the dads fingers stuck.

## 2018-11-18 ENCOUNTER — Encounter: Payer: Self-pay | Admitting: Internal Medicine

## 2018-11-18 ENCOUNTER — Ambulatory Visit (INDEPENDENT_AMBULATORY_CARE_PROVIDER_SITE_OTHER)
Admission: RE | Admit: 2018-11-18 | Discharge: 2018-11-18 | Disposition: A | Discharge status: Home / Self Care | Payer: BLUE CROSS/BLUE SHIELD | Source: Ambulatory Visit | Attending: Internal Medicine | Admitting: Internal Medicine

## 2018-11-18 ENCOUNTER — Ambulatory Visit (INDEPENDENT_AMBULATORY_CARE_PROVIDER_SITE_OTHER): Payer: BLUE CROSS/BLUE SHIELD | Admitting: Internal Medicine

## 2018-11-18 VITALS — BP 150/74 | HR 66 | Ht 75.0 in | Wt 214.0 lb

## 2018-11-18 DIAGNOSIS — R0609 Other forms of dyspnea: Secondary | ICD-10-CM

## 2018-11-18 DIAGNOSIS — R0600 Dyspnea, unspecified: Secondary | ICD-10-CM

## 2018-11-18 NOTE — Progress Notes (Signed)
William Webb, male    DOB: 04-15-1951,    MRN: [id number]   Brief patient profile:  21 yobm quit smoking 1985 with no sequelae dx dm/kidney dz/ neuropathy/hbp then admitted pna/chf to [facility] 2017 x 2 weeks then rehab and debilitated with R knee problems req  TKR and multiple admits related to dm/kidney problems then admit 10/16/18:   Admit date: 10/16/2018 Discharge date: 10/21/2018  PCP: Billie Ruddy, MD  DISCHARGE DIAGNOSES:  NSTEMI, medical management Insulin-dependent diabetes mellitus with episodes of hypoglycemia Kidney disease stage III Essential hypertension Hyperlipidemia  RECOMMENDATIONS FOR OUTPATIENT FOLLOW UP: 1. Patient instructed to follow-up with his primary care provider for blood work. 2. Cardiology to arrange outpatient follow-up. 3. Patient to call [facility] for outpatient management of his chronic kidney disease   DISCHARGE CONDITION: fair  Diet recommendation: Modified carbohydrate       Filed Weights   10/18/18 0456 10/19/18 0500 10/21/18 0530  Weight: 95.3 kg 95.6 kg 98.5 kg    INITIAL HISTORY: 68 year old African-American malewith a past medical history of insulin-dependent diabetes with episodes of hypoglycemia, chronic kidney disease stage III,essential hypertension, presented with chest pain. Hospitalized for further management.   Reason for Visit:Chest pain,NSTEMI  Consultants:Cardiology. Nephrology.  Procedures:  Transthoracic echocardiogram Study Conclusions  - Left ventricle: The cavity size was normal. Wall thickness was increased in a pattern of severe LVH. Systolic function was normal. The estimated ejection fraction was in the range of 50% to 55%. Wall motion was normal; there were no regional wall motion abnormalities. Doppler parameters are consistent with abnormal left ventricular relaxation (grade 1 diastolic dysfunction). Doppler parameters are  consistent with high ventricular filling pressure. - Left atrium: The atrium was moderately dilated. - Right atrium: The atrium was mildly dilated.  Impressions:  - Low normal to mildly reduced LV systolic function; EF 50; severe LVH; mild diastolic dysfunction; biatrial enlargement.  Cardiac catheterization Conclusion   LM: Normal LAD: Distal 70% diffuse disease. Ostial 90% stenosis in D1. Patent prox Diag 1 stent LCx: Large OM1 with patent prior stent RCA: 100% occluded RPDA Partially successful PTCA attempt 100%--99% stenosis TIMI flow 0-I  LVEDP 16 mmHg  Recommendation: At this point, chances of successful PCI without large amount of contrast were low. Thus, I decided to stop the procedure. Fortunately, his LVEF is normal with normal wall motion. Continue medical management with DAPT (prefer aspirin and Brilinta) and aggressive hypertension and diabetes management.     HOSPITAL COURSE:   NSTEMI Seen by cardiology. Underwent cardiac catheterization 12/23. No intervention performed. Symptomatic and medical management for now. Patient to be continued on dual antiplatelet treatment with aspirin and Brilinta. He was on Plavix previously. Statin. Nitrates. Patient on metoprolol.  Cardiology to arrange outpatient follow-up.  Chronic kidney disease stage III This is secondary to diabetic nephropathy and microvascular disease.  Nephrology was consulted due to need to do cardiac catheterization with exposure to contrast.Patient was given Mucomyst and IV fluids to protect his kidneys due to contrast needed for the catheterization.   Creatinine noted to be slightly higher today.  Discussed with Dr. Justin Mend who is okay with patient being discharged.  He will need to have blood work done in 1 week.  He has good urine output.    Insulin-dependent diabetes mellitus Patient was placed on Lantus in the hospital.  He was continued on NovoLog.   However he was found to have episodes of hypoglycemia.  His HbA1c was 7.1 in October.  Rechecked during this hospitalization and noted to be 6.6.  He is requiring lower doses of his insulin.  He will be asked to discontinue NovoLog completely.  His dose of Tyler Aas will be reduced significantly.  He will be asked not to take his insulin if his CBG is less than 150.  He will need to follow-up with his primary care provider for further management.    Essential hypertension Patient was taken off of valsartan due to his elevated creatinine.  Hydralazine was added.  Continue with the beta-blocker and nitrates and amlodipine.  Blood pressure is better.  Will need close monitoring in the outpatient setting.      11/18/2018  Pulmonary/ 1st office eval/Deyana Wnuk  Chief Complaint  Patient presents with  . Consult    SOB with exertion, dry cough  Dyspnea:  MMRC3 = can't walk 100 yards even at a slow pace at a flat grade s stopping due to sob   Cough: resolved  Sleep: on horizontal bed with 2 pillows SABA use: none  No obvious day to day or daytime variability or assoc excess/ purulent sputum or mucus plugs or hemoptysis or cp or chest tightness, subjective wheeze or overt sinus or hb symptoms.   Sleeping ok as above without nocturnal  or early am exacerbation  of respiratory  c/o's or need for noct saba. Also denies any obvious fluctuation of symptoms with weather or environmental changes or other aggravating or alleviating factors except as outlined above   No unusual exposure hx or h/o childhood pna/ asthma or knowledge of premature birth.  Current Allergies, Complete Past Medical History, Past Surgical History, Family History, and Social History were reviewed in [employer] record.  ROS  The following are not active complaints unless bolded Hoarseness, sore throat, dysphagia, dental problems, itching, sneezing,  nasal congestion or discharge of excess mucus or purulent  secretions, ear ache,   fever, chills, sweats, unintended wt loss or wt gain, classically pleuritic or exertional cp,  orthopnea pnd or arm/hand swelling  or leg swelling improving, presyncope, palpitations, abdominal pain, anorexia, nausea, vomiting, diarrhea  or change in bowel habits or change in bladder habits, change in stools or change in urine, dysuria, hematuria,  rash, arthralgias, visual complaints, headache, numbness, weakness or ataxia or problems with walking related to djd and neuropathy or coordination,  change in mood or  memory.           Past Medical History:  Diagnosis Date  . Anemia   . CKD (chronic kidney disease) stage 3, GFR 30-59 ml/min (HCC)   . Coronary artery disease   . Diabetic peripheral neuropathy ([place])   . GERD (gastroesophageal reflux disease)   . Hyperlipidemia   . Hypertension   . Hypertensive crisis 10/16/2018  . Noncompliance with medication regimen   . Osteoarthritis    "legs, back" (10/16/2018)  . Pneumonia 2016/01/22   "real bad; I died and they had to bring me back" (10/16/2018)  . Seasonal allergies   . Type II diabetes mellitus ([place])     Outpatient Medications Prior to Visit  Medication Sig Dispense Refill  . acetaminophen (TYLENOL) 500 MG tablet Take 1,000 mg by mouth 2 (two) times daily.     Marland Kitchen amLODipine (NORVASC) 10 MG tablet Take 1 tablet (10 mg total) by mouth daily. 30 tablet 0  . aspirin EC 81 MG tablet Take 1 tablet (81 mg total) by mouth daily. 90 tablet 2  . cholecalciferol (VITAMIN D) 25 MCG (1000  UT) tablet Take 1,000 Units by mouth daily.     . Continuous Blood Gluc Sensor (FREESTYLE LIBRE 14 DAY SENSOR) MISC 1 each by Does not apply route daily. 1 each 0  . Cyanocobalamin 2500 MCG CHEW Chew 2,500 mcg by mouth daily.    . furosemide (LASIX) 20 MG tablet Take 20 mg by mouth 3 (three) times daily. Two tablets in the morning and one tablet in the evening    . gabapentin (NEURONTIN) 600 MG tablet Take 2 tablets (1,200 mg total) by mouth 3  (three) times daily. 180 tablet 1  . hydrALAZINE (APRESOLINE) 100 MG tablet Take 1 tablet (100 mg total) by mouth every 8 (eight) hours. 90 tablet 1  . Hyprom-Naphaz-Polysorb-Zn Sulf (CLEAR EYES COMPLETE OP) Place 4-5 drops into both eyes daily as needed (dry eyes).    . insulin degludec (TRESIBA FLEXTOUCH) 100 UNIT/ML SOPN FlexTouch Pen Inject 0.05 mLs (5 Units total) into the skin daily. DO NOT TAKE IF CBG'S ARE LESS THAN 150 15 pen 2  . Insulin Pen Needle (BD PEN NEEDLE NANO U/F) 32G X 4 MM MISC USE AS DIRECTED FOR TRESIBA INJECTIONS ONCE DAILY 90 each 1  . isosorbide dinitrate (ISORDIL) 30 MG tablet Take 30 mg by mouth daily.    Marland Kitchen linaclotide (LINZESS) 290 MCG CAPS capsule Take 1 capsule (290 mcg total) by mouth daily before breakfast. 12 capsule 0  . methocarbamol (ROBAXIN) 500 MG tablet TAKE 1 TABLET BY MOUTH TWICE DAILY AS NEEDED FOR MUSCLE SPASM (Patient taking differently: Take 500 mg by mouth 2 (two) times daily as needed for muscle spasms. ) 60 tablet 1  . metoprolol succinate (TOPROL-XL) 50 MG 24 hr tablet Take 50 mg by mouth daily. Take with or immediately following a meal.     . montelukast (SINGULAIR) 10 MG tablet Take 1 tablet (10 mg total) by mouth at bedtime. (Patient taking differently: Take 10 mg by mouth every morning. ) 90 tablet 3  . omeprazole (PRILOSEC) 20 MG capsule Take 20 mg by mouth 2 (two) times daily.    . rosuvastatin (CRESTOR) 20 MG tablet Take 1 tablet (20 mg total) by mouth daily at 6 PM. 30 tablet 0  . ticagrelor (BRILINTA) 90 MG TABS tablet Take 1 tablet (90 mg total) by mouth 2 (two) times daily. 60 tablet 0  . vitamin E (VITAMIN E) 1000 UNIT capsule Take 1,000 Units by mouth daily.         Objective:     BP (!) 150/74 (BP Location: Left Arm, Cuff Size: Normal)   Pulse 66   Ht 6\' 3"  (1.905 m)   Wt 214 lb (97.1 kg)   SpO2 99%   BMI 26.75 kg/m   SpO2: 99 % RA   Elderly am bm looks about 33 y older than stated  HEENT: nl turbinates bilaterally, and  oropharynx. Nl external ear canals without cough reflex   NECK :  without JVD/Nodes/TM/ nl carotid upstrokes bilaterally   LUNGS: no acc muscle use,  Nl contour chest which is clear to A and P bilaterally without cough on insp or exp maneuvers   CV:  RRR  no s3  With II/VI sem but no  increase in P2, and trace bilateral ankle edema   ABD:  soft and nontender with nl inspiratory excursion in the supine position. No bruits or organomegaly appreciated, bowel sounds nl  MS:  Slow slt shuffling/awkward gait/ ext warm without deformities, calf tenderness, cyanosis or clubbing No obvious joint  restrictions   SKIN: warm and dry without lesions    NEURO:  alert, approp, nl sensorium with  no motor or cerebellar deficits apparent.    CXR PA and Lateral:   11/18/2018 :    I personally reviewed images and   impression as follows:   Improved aeration, almost completely resolved effusions/ lots of barium in colon from study done 11/17/18   Also DgEs 11/17/18 1. Normal oral and pharyngeal phases of swallowing, with no laryngeal penetration or tracheobronchial aspiration. 2. Small linear filling defect anteriorly at the junction of the hypopharynx and cervical esophagus, which could represent a small cervical esophageal web, as can be seen with chronic iron deficiency anemia. 3. No hiatal hernia.  No gastroesophageal reflux observed. 4. Moderate esophageal dysmotility, with a pattern characteristic of chronic reflux related dysmotility. 5. Minimal eccentric puckering of the lower thoracic esophagus near the esophagogastric junction, without evidence of a significant esophageal stricture, see comments. No esophageal mass or ulcer. 6. Small plaque-like filling defects throughout the mid to upper thoracic esophagus, compatible with glycogenic acanthosis       Assessment   DOE (dyspnea on exertion) Quit smoking 1985 Onset 2017  - Spirometry 11/18/2018  FEV1 2.6 (88%)  Ratio 80 s curvature s  prior rx - 11/18/2018   Walked RA  2 laps @  approx 273ft each @ slow pace  stopped due to  End of study, fatigue but no sob or desat      When respiratory symptoms begin or become refractory well after a patient reports complete smoking cessation,  Especially when this wasn't the case while they were smoking, a red flag is raised based on the work of Dr Kris Mouton which states: if you quit smoking when your best day FEV1 is still well preserved (as it clearly was here by his hx and still is by his spirometry and exam) it is highly unlikely you will progress to more severe disease.  That is to say, once the smoking stops,  the symptoms should not suddenly erupt or markedly worsen.  If so, the differential diagnosis should include  obesity/deconditioning,  LPR/Reflux/Aspiration syndromes,  occult CHF, or  especially side effect of medications commonly used in this population.    NB: Note that pleural effusion and copd have the same effect on insp muscles/mechanics (both shorten their length prior to inspiration making them weaker with less force reserve) but this problem has largely been eliminated at time of ov so likely transudative and related to chf/cri.   I sense his problem is more more fatigue and issues with leg strength / stability in setting of dm neuropathy plus issues with chf and cri with tendency to vol overload and reduction in buffering capacity (bicarb trending down due to cri) and assured him and daughter that he is in good hands and does not need pulmonary rx or f/u but happy to see prn.     Total time devoted to counseling  > 50 % of initial 60 min office visit:  Review extensive  Case hx  with pt/daugher and directly observe ambulatory 02 study/  discussion of options/alternatives/ personally creating written customized instructions  in presence of pt  then going over those specific  Instructions directly with the pt including how to use all of the meds but in particular covering  each new medication in detail and the difference between the maintenance= "automatic" meds and the prns using an action plan format for the latter (If this problem/symptom =>  do that organization reading Left to right).  Please see AVS from this visit for a full list of these instructions which I personally wrote for this pt and  are unique to this visit.      Christinia Gully, MD 11/18/2018

## 2018-11-18 NOTE — Telephone Encounter (Signed)
Pt's Daughter Shelbie Hutching called to speak with Izora Gala. She stated she had several questions and would like a call back as soon as possible. Please advise. CB#[phone]

## 2018-11-18 NOTE — Patient Instructions (Signed)
Please remember to go to the  x-ray department  for your tests - we will call you with the results when they are available     Pulmonary follow up is as needed  - your lungs appear to be in very good shape!

## 2018-11-18 NOTE — Telephone Encounter (Signed)
Spoke with pt daughter regarding her concerns of the prio Auth of the pt Libre sensor, offered her to pick up a  meter at the office meantime while waiting for the prio- authorization, pt daughter states that she does not have time to come by the office to pick up he meter. Advised that would send an order for a meter to pt pharmacy but daughter stated that pt is Anaemic and it would not be a good idea to keep trying to get blood from his fingers. Pt also needs a new home health orders faxed  to [facility] to restart pt on home health services.

## 2018-11-19 ENCOUNTER — Other Ambulatory Visit: Payer: Self-pay

## 2018-11-19 ENCOUNTER — Encounter: Payer: Self-pay | Admitting: Internal Medicine

## 2018-11-19 ENCOUNTER — Telehealth: Payer: Self-pay | Admitting: Internal Medicine

## 2018-11-19 MED ORDER — ACCU-CHEK GUIDE ME W/DEVICE KIT: 1.0000 | PACK | Freq: Three times a day (TID) | 0 refills | Status: DC

## 2018-11-19 NOTE — Telephone Encounter (Signed)
Please advise 

## 2018-11-19 NOTE — Telephone Encounter (Signed)
This matter has been addressed multiple times.  Pt's daughter has been advised of the PA and that it is up to the insurance company unless she wants to pay for the meter out of pocket.

## 2018-11-19 NOTE — Assessment & Plan Note (Addendum)
Quit smoking 1985 Onset 2017  - Spirometry 11/18/2018  FEV1 2.6 (88%)  Ratio 80 s curvature s prior rx - 11/18/2018   Walked RA  2 laps @  approx 24ft each @ slow pace  stopped due to  End of study, fatigue but no sob or desat      When respiratory symptoms begin or become refractory well after a patient reports complete smoking cessation,  Especially when this wasn't the case while they were smoking, a red flag is raised based on the work of Dr Kris Mouton which states: if you quit smoking when your best day FEV1 is still well preserved (as it clearly was here by his hx and still is by his spirometry and exam) it is highly unlikely you will progress to more severe disease.  That is to say, once the smoking stops,  the symptoms should not suddenly erupt or markedly worsen.  If so, the differential diagnosis should include  obesity/deconditioning,  LPR/Reflux/Aspiration syndromes,  occult CHF, or  especially side effect of medications commonly used in this population.    NB: Note that pleural effusion and copd have the same effect on insp muscles/mechanics (both shorten their length prior to inspiration making them weaker with less force reserve) but this problem has largely been eliminated at time of ov so likely transudative and related to chf/cri.   I sense his problem is more more fatigue and issues with leg strength / stability in setting of dm neuropathy plus issues with chf and cri with tendency to vol overload and reduction in buffering capacity (bicarb trending down due to cri) and assured him and daughter that he is in good hands and does not need pulmonary rx or f/u but happy to see prn.     Total time devoted to counseling  > 50 % of initial 60 min office visit:  Review extensive  Case hx  with pt/daugher and directly observe ambulatory 02 study/  discussion of options/alternatives/ personally creating written customized instructions  in presence of pt  then going over those specific   Instructions directly with the pt including how to use all of the meds but in particular covering each new medication in detail and the difference between the maintenance= "automatic" meds and the prns using an action plan format for the latter (If this problem/symptom => do that organization reading Left to right).  Please see AVS from this visit for a full list of these instructions which I personally wrote for this pt and  are unique to this visit.

## 2018-11-19 NOTE — Progress Notes (Signed)
LMTCB

## 2018-11-19 NOTE — Telephone Encounter (Signed)
Called and spoke with patients daughter (DPR) she is aware of results and verbalized understanding. Nothing further needed.

## 2018-11-19 NOTE — Telephone Encounter (Signed)
Please Advise

## 2018-11-19 NOTE — Telephone Encounter (Signed)
Prior auth for [employer] 14 day sensor sent to [email or web address] [id number].

## 2018-11-19 NOTE — Telephone Encounter (Signed)
Per [facility] agent pt's wife called to check status of Libre. Advised [facility] agent to please ask pt's wife to contact insurance company to check status of PA. Also, pt's wife is concerned about checking pt's blood glucose because pt is anemic and this is why they do not want to use a normal BCG system. (They may need some education on this.)

## 2018-11-20 ENCOUNTER — Ambulatory Visit: Payer: BLUE CROSS/BLUE SHIELD | Admitting: Internal Medicine

## 2018-11-20 ENCOUNTER — Ambulatory Visit: Payer: BLUE CROSS/BLUE SHIELD | Admitting: Endocrinology

## 2018-11-21 NOTE — Telephone Encounter (Signed)
Pt daughter Shelbie Hutching is calling checking on PA

## 2018-11-21 NOTE — Telephone Encounter (Addendum)
Pt daughter Shelbie Hutching is calling checking on the status of order of home health

## 2018-11-24 ENCOUNTER — Other Ambulatory Visit: Payer: Self-pay | Admitting: Family Medicine

## 2018-11-24 MED FILL — hydrALAZINE HCL 100 MG TABS: 100 | 30 days supply | Qty: 90 | Fill #1

## 2018-11-24 NOTE — Telephone Encounter (Signed)
Daughter called, pt is out of all medications, and can come pick up samples if any are available,  Please cal to let her know

## 2018-11-24 NOTE — Telephone Encounter (Signed)
Pt daughter calling to check on status of PA for meter

## 2018-11-24 NOTE — Telephone Encounter (Signed)
Patient's daughter, Shelbie Hutching called again and would like to know could this please expiated since he is completely out. She said she is nervous because he can not go without his medications. She would like the nurse to call her back [phone], she is aware of the med refill turn around time as well.

## 2018-11-24 NOTE — Telephone Encounter (Signed)
Fax received from [facility] stating the request was denied and this was forwarded to Dr Volanda Napoleon' asst.

## 2018-11-24 NOTE — Telephone Encounter (Signed)
Copied from [place] [id number]. Topic: Quick Communication - Rx Refill/Question >> Nov 24, 2018  8:30 AM Nathanial Millman J wrote: Medication: ticagrelor (BRILINTA) 90 MG TABS tablet, methocarbamol (ROBAXIN) 500 MG tablet, hydrALAZINE (APRESOLINE) 100 MG tablet, rosuvastatin (CRESTOR) 20 MG tablet  Has the patient contacted their pharmacy? No. (Agent: If no, request that the patient contact the pharmacy for the refill.) (Agent: If yes, when and what did the pharmacy advise?)  Preferred Pharmacy (with phone number or street name): [facility] [address]  These were all written by the hospitalist, and need refills  Pt is out of some of these meds and would like filled as soon as possible  Agent: Please be advised that RX refills may take up to 3 business days. We ask that you follow-up with your pharmacy.

## 2018-11-24 NOTE — Telephone Encounter (Signed)
Pt daughter is calling to follow up on orders please call back

## 2018-11-24 NOTE — Telephone Encounter (Signed)
Requested medication (s) are due for refill today: Yes  Requested medication (s) are on the active medication list: Yes  Last refill:  10/21/18  Future visit scheduled: Yes  Notes to clinic:  Unable to refill per protocol. Patient is out of meds.     Requested Prescriptions  Pending Prescriptions Disp Refills   ticagrelor (BRILINTA) 90 MG TABS tablet 60 tablet 0    Sig: Take 1 tablet (90 mg total) by mouth 2 (two) times daily.     Not Delegated - Hematology: Antiplatelets - ticagrelor Failed - 11/24/2018  2:35 PM      Failed - This refill cannot be delegated      Failed - Cr in normal range and within 180 days    Creatinine, Ser  Date Value Ref Range Status  10/29/2018 2.73 (H) 0.61 - 1.24 mg/dL Final         Failed - HCT in normal range and within 180 days    HCT  Date Value Ref Range Status  10/29/2018 29.1 (L) 39.0 - 52.0 % Final         Failed - HGB in normal range and within 180 days    Hemoglobin  Date Value Ref Range Status  10/29/2018 9.3 (L) 13.0 - 17.0 g/dL Final         Passed - PLT in normal range and within 180 days    Platelets  Date Value Ref Range Status  10/29/2018 289 150 - 400 K/uL Final         Passed - Valid encounter within last 6 months    Recent Outpatient Visits          1 week ago Bilateral leg edema   [facility]rap[facility] at [place], MD   3 weeks ago NSTEMI (non-ST elevated myocardial infarction) ([place])   [place] at [facility], Langley Adie, MD   2 months ago Type 2 diabetes mellitus with stage 3 chronic kidney disease, with long-term current use of insulin ([place])   Therapist, [facility] at [place], MD   3 months ago Essential hypertension   Therapist[facility] at [place], MD   5 months ago Cough with hemoptysis   Therapist[facility] at [facility], Langley Adie, MD      Future Appointments            In 1 month Volanda Napoleon, Langley Adie, MD [place] at  [place], [place]          methocarbamol (ROBAXIN) 500 MG tablet 60 tablet 1     Not Delegated - Analgesics:  Muscle Relaxants Failed - 11/24/2018  2:35 PM      Failed - This refill cannot be delegated      Passed - Valid encounter within last 6 months    Recent Outpatient Visits          1 week ago Bilateral leg edema   [place] at [place], MD   3 weeks ago NSTEMI (non-ST elevated myocardial infarction) ([place])   [place] at [facility], Langley Adie, MD   2 months ago Type 2 diabetes mellitus with stage 3 chronic kidney disease, with long-term current use of insulin ([place])   [place] at [place], MD   3 months ago Essential hypertension   Therapist, [facility] at [place], MD   5 months ago Cough with hemoptysis   Therapist, music at [facility], [place],  MD      Future Appointments            In 1 month [place], Langley Adie, MD [name] at [place], [place]          rosuvastatin (CRESTOR) 20 MG tablet 30 tablet 0    Sig: Take 1 tablet (20 mg total) by mouth daily at 6 PM.     Cardiovascular:  Antilipid - Statins Failed - 11/24/2018  2:35 PM      Failed - LDL in normal range and within 360 days    LDL Cholesterol  Date Value Ref Range Status  03/06/2018 105 (H) 0 - 99 mg/dL Final         Failed - HDL in normal range and within 360 days    HDL  Date Value Ref Range Status  03/06/2018 37.60 (L) >39.00 mg/dL Final         Passed - Total Cholesterol in normal range and within 360 days    Cholesterol  Date Value Ref Range Status  03/06/2018 162 0 - 200 mg/dL Final    Comment:    ATP III Classification       Desirable:  < 200 mg/dL               Borderline High:  200 - 239 mg/dL          High:  > = 240 mg/dL         Passed - Triglycerides in normal range and within 360 days    Triglycerides  Date Value Ref Range Status  03/06/2018 95.0 0.0 - 149.0 mg/dL Final     Comment:    Normal:  <150 mg/dLBorderline High:  150 - 199 mg/dL         Passed - Patient is not pregnant      Passed - Valid encounter within last 12 months    Recent Outpatient Visits          1 week ago Bilateral leg edema   Therapist, [facility] at [place], MD   3 weeks ago NSTEMI (non-ST elevated myocardial infarction) ([place])   [place] at [place], MD   2 months ago Type 2 diabetes mellitus with stage 3 chronic kidney disease, with long-term current use of insulin ([place])   [place] at [facility], Langley Adie, MD   3 months ago Essential hypertension   Therapist, music at [place], MD   5 months ago Cough with hemoptysis   Therapist, music at [place], MD      Future Appointments            In 1 month Volanda Napoleon, Langley Adie, MD [place] at [place], [facility]          hydrALAZINE (APRESOLINE) 100 MG tablet 90 tablet 1    Sig: Take 1 tablet (100 mg total) by mouth every 8 (eight) hours.     Cardiovascular:  Vasodilators Failed - 11/24/2018  2:35 PM      Failed - HCT in normal range and within 360 days    HCT  Date Value Ref Range Status  10/29/2018 29.1 (L) 39.0 - 52.0 % Final         Failed - HGB in normal range and within 360 days    Hemoglobin  Date Value Ref Range Status  10/29/2018 9.3 (L) 13.0 - 17.0 g/dL Final         Failed -  RBC in normal range and within 360 days    RBC  Date Value Ref Range Status  10/29/2018 3.35 (L) 4.22 - 5.81 MIL/uL Final         Failed - Last BP in normal range    BP Readings from Last 1 Encounters:  11/18/18 (!) 150/74         Passed - WBC in normal range and within 360 days    WBC  Date Value Ref Range Status  10/29/2018 7.4 4.0 - 10.5 K/uL Final         Passed - PLT in normal range and within 360 days    Platelets  Date Value Ref Range Status  10/29/2018 289 150 - 400 K/uL Final         Passed - Valid encounter  within last 12 months    Recent Outpatient Visits          1 week ago Bilateral leg edema   [place] at [place], MD   3 weeks ago NSTEMI (non-ST elevated myocardial infarction) ([place])   [place] at [place], MD   2 months ago Type 2 diabetes mellitus with stage 3 chronic kidney disease, with long-term current use of insulin ([place])   Therapist, music at [place], MD   3 months ago Essential hypertension   Therapist, music at [place], MD   5 months ago Cough with hemoptysis   Therapist, music at [place], MD      Future Appointments            In 1 month Volanda Napoleon, Langley Adie, MD [employer] at [place], [facility]

## 2018-11-25 ENCOUNTER — Emergency Department (HOSPITAL_COMMUNITY): Payer: BLUE CROSS/BLUE SHIELD

## 2018-11-25 ENCOUNTER — Encounter: Payer: Self-pay | Admitting: Family Medicine

## 2018-11-25 ENCOUNTER — Emergency Department (HOSPITAL_COMMUNITY)
Admission: EM | Admit: 2018-11-25 | Discharge: 2018-11-26 | Disposition: A | Discharge status: Home / Self Care | Payer: BLUE CROSS/BLUE SHIELD | Attending: Emergency Medicine | Admitting: Emergency Medicine

## 2018-11-25 ENCOUNTER — Other Ambulatory Visit: Payer: Self-pay

## 2018-11-25 DIAGNOSIS — J189 Pneumonia, unspecified organism: Secondary | ICD-10-CM | POA: Insufficient documentation

## 2018-11-25 DIAGNOSIS — N183 Chronic kidney disease, stage 3 (moderate): Secondary | ICD-10-CM | POA: Insufficient documentation

## 2018-11-25 DIAGNOSIS — I129 Hypertensive chronic kidney disease with stage 1 through stage 4 chronic kidney disease, or unspecified chronic kidney disease: Secondary | ICD-10-CM | POA: Insufficient documentation

## 2018-11-25 DIAGNOSIS — I2510 Atherosclerotic heart disease of native coronary artery without angina pectoris: Secondary | ICD-10-CM | POA: Diagnosis not present

## 2018-11-25 DIAGNOSIS — Z87891 Personal history of nicotine dependence: Secondary | ICD-10-CM | POA: Diagnosis not present

## 2018-11-25 DIAGNOSIS — Z7982 Long term (current) use of aspirin: Secondary | ICD-10-CM | POA: Insufficient documentation

## 2018-11-25 DIAGNOSIS — I252 Old myocardial infarction: Secondary | ICD-10-CM | POA: Insufficient documentation

## 2018-11-25 DIAGNOSIS — E1122 Type 2 diabetes mellitus with diabetic chronic kidney disease: Secondary | ICD-10-CM | POA: Insufficient documentation

## 2018-11-25 DIAGNOSIS — Z96651 Presence of right artificial knee joint: Secondary | ICD-10-CM | POA: Insufficient documentation

## 2018-11-25 DIAGNOSIS — Z794 Long term (current) use of insulin: Secondary | ICD-10-CM | POA: Diagnosis not present

## 2018-11-25 DIAGNOSIS — R042 Hemoptysis: Secondary | ICD-10-CM | POA: Diagnosis present

## 2018-11-25 DIAGNOSIS — Z79899 Other long term (current) drug therapy: Secondary | ICD-10-CM | POA: Insufficient documentation

## 2018-11-25 LAB — CBC
HCT: 28.1 % — ABNORMAL LOW (ref 39.0–52.0)
Hemoglobin: 8.9 g/dL — ABNORMAL LOW (ref 13.0–17.0)
MCH: 28.7 pg (ref 26.0–34.0)
MCHC: 31.7 g/dL (ref 30.0–36.0)
MCV: 90.6 fL (ref 80.0–100.0)
Platelets: 255 10*3/uL (ref 150–400)
RBC: 3.1 MIL/uL — ABNORMAL LOW (ref 4.22–5.81)
RDW: 13.5 % (ref 11.5–15.5)
WBC: 9 10*3/uL (ref 4.0–10.5)
nRBC: 0 % (ref 0.0–0.2)

## 2018-11-25 LAB — BASIC METABOLIC PANEL
Anion gap: 10 (ref 5–15)
BUN: 25 mg/dL — ABNORMAL HIGH (ref 8–23)
CO2: 24 mmol/L (ref 22–32)
Calcium: 8.3 mg/dL — ABNORMAL LOW (ref 8.9–10.3)
Chloride: 104 mmol/L (ref 98–111)
Creatinine, Ser: 2.18 mg/dL — ABNORMAL HIGH (ref 0.61–1.24)
GFR calc Af Amer: 35 mL/min — ABNORMAL LOW (ref >60)
GFR calc non Af Amer: 30 mL/min — ABNORMAL LOW (ref >60)
Glucose, Bld: 131 mg/dL — ABNORMAL HIGH (ref 70–99)
Potassium: 4.8 mmol/L (ref 3.5–5.1)
Sodium: 138 mmol/L (ref 135–145)

## 2018-11-25 LAB — I-STAT TROPONIN, ED: Troponin i, poc: 0.03 ng/mL (ref 0.00–0.08)

## 2018-11-25 LAB — BRAIN NATRIURETIC PEPTIDE: B Natriuretic Peptide: 503.8 pg/mL — ABNORMAL HIGH (ref 0.0–100.0)

## 2018-11-25 MED ORDER — TICAGRELOR 90 MG PO TABS: 90.0000 mg | ORAL_TABLET | Freq: Two times a day (BID) | ORAL | 0 refills | Status: DC

## 2018-11-25 MED ORDER — ROSUVASTATIN CALCIUM 20 MG PO TABS: 20.0000 mg | ORAL_TABLET | Freq: Every day | ORAL | 1 refills | Status: DC

## 2018-11-25 MED ORDER — ROSUVASTATIN CALCIUM 20 MG PO TABS: 20.0000 mg | ORAL_TABLET | Freq: Every day | ORAL | 0 refills | Status: DC

## 2018-11-25 MED ORDER — SODIUM CHLORIDE 0.9% FLUSH
3.0000 mL | Freq: Once | INTRAVENOUS | Status: AC
  Administered 2018-11-26: 3 mL via INTRAVENOUS

## 2018-11-25 NOTE — Telephone Encounter (Signed)
Refills sent to pt pharmacy, pt has enough refills on HydraLazine

## 2018-11-25 NOTE — Telephone Encounter (Signed)
Pt daughter [place] aware that the Rx that needed Prior Josem Kaufmann was denied, stated that pt has an appointment to establish care with an Endocrinology in February 2020.

## 2018-11-25 NOTE — Telephone Encounter (Signed)
Pt daughter is calling again for medications - ticagrelor (BRILINTA) 90 MG TABS tablet, methocarbamol (ROBAXIN) 500 MG tablet, hydrALAZINE (APRESOLINE) 100 MG tablet, rosuvastatin (CRESTOR) 20 MG tablet. She states that the initial call was on Friday and she needs someone to get back to her as he is out of the medications. He ran out 2 days ago.  She is waiting for auth on [employer]. She is wanting to know what the delay is as [employer] states they have not received any request.   She said that she's waiting for orders for PT, OT, and [facility] aide and she was told [facility] has called and waiting for a response.

## 2018-11-25 NOTE — Telephone Encounter (Signed)
Please advise if ok to refill the Methocarbamol

## 2018-11-25 NOTE — ED Triage Notes (Addendum)
Patient states that started coughing up a scant amount of blood today. Patient has had a dry cough since yesterday; had a cold two weeks ago. Patient was told that he had some tearing in his esophagus.

## 2018-11-25 NOTE — Telephone Encounter (Signed)
Billie Ruddy, MD  to Wyvonne Lenz, CMA   3:20 PM Note    This matter has been addressed multiple times.  Pt's daughter has been advised of the PA and that it is up to the insurance company unless she wants to pay for the meter out of pocket.      Pt daughter states that she tried to pay out of pocket for the [place] and that pharmacy told her that she could not purchase the meter out right and that it would have to go through insurance. Pt daughter is going to contact the pharmacy and see if she is able to get the device out of pocket.   She did get a meter OTC and has been sticking the patients finger to check BS - daughter states that since he is anemic it makes getting a good sample from finger sticks difficult and painful. Daughter is requesting that we [facility]-send the [facility] stating that the patient is anemic and any other statement Dr Volanda Napoleon wants to put on there -- daughter states that it got approved in the past because of the Dx of anemia.   Pt is now waiting on refills and orders to be signed off on. See message below and previous TE

## 2018-11-25 NOTE — Telephone Encounter (Signed)
Pt daughter states that she will do research on a Home health agency for pt and will call the office with information.

## 2018-11-26 DIAGNOSIS — J189 Pneumonia, unspecified organism: Secondary | ICD-10-CM | POA: Diagnosis not present

## 2018-11-26 LAB — CBG MONITORING, ED
Glucose-Capillary: 111 mg/dL — ABNORMAL HIGH (ref 70–99)
Glucose-Capillary: 121 mg/dL — ABNORMAL HIGH (ref 70–99)
Glucose-Capillary: 150 mg/dL — ABNORMAL HIGH (ref 70–99)

## 2018-11-26 LAB — I-STAT TROPONIN, ED: Troponin i, poc: 0.04 ng/mL (ref 0.00–0.08)

## 2018-11-26 LAB — LACTIC ACID, PLASMA: Lactic Acid, Venous: 0.6 mmol/L (ref 0.5–1.9)

## 2018-11-26 MED ORDER — LEVOFLOXACIN 750 MG PO TABS
750.0000 mg | ORAL_TABLET | Freq: Once | ORAL | Status: AC
  Administered 2018-11-26: 750 mg via ORAL
  Filled 2018-11-26: qty 1

## 2018-11-26 MED ORDER — LEVOFLOXACIN 500 MG PO TABS: 500.0000 mg | ORAL_TABLET | Freq: Every day | ORAL | 0 refills | Status: DC

## 2018-11-26 NOTE — ED Provider Notes (Signed)
[facility] EMERGENCY DEPARTMENT Provider Note  CSN: [id number] Arrival date & time: 11/25/18 January 16, 2132  Chief Complaint(s) Hemoptysis  HPI William Webb is a 68 y.o. male with extensive past medical history listed below who presents to the emergency department with 2 days of generalized fatigue and chills with cough and 1 episode of hemoptysis earlier today.  Symptoms have been gradually worsening since onset.  He denies any associated nausea or vomiting.  No chest pain or shortness of breath.  No abdominal pain.  No diarrhea.  No alleviating or aggravating factors.  The history is provided by the patient.    Past Medical History Past Medical History:  Diagnosis Date  . Anemia   . CKD (chronic kidney disease) stage 3, GFR 30-59 ml/min (HCC)   . Coronary artery disease   . Diabetic peripheral neuropathy ([place])   . GERD (gastroesophageal reflux disease)   . Hyperlipidemia   . Hypertension   . Hypertensive crisis 10/16/2018  . Noncompliance with medication regimen   . Osteoarthritis    "legs, back" (10/16/2018)  . Pneumonia 2016/01/16   "real bad; I died and they had to bring me back" (10/16/2018)  . Seasonal allergies   . Type II diabetes mellitus [facility])    Patient Active Problem List   Diagnosis Date Noted  . DOE (dyspnea on exertion) 11/18/2018  . NSTEMI (non-ST elevated myocardial infarction) ([place]) 10/16/2018  . Hypertensive crisis 10/16/2018  . Hypertension   . Hyperlipidemia   . Diabetes mellitus with renal complications ([place])   . Noncompliance with medication regimen   . Chronic pain syndrome 05/29/2018  . Type 2 diabetes mellitus with stage 3 chronic kidney disease, with long-term current use of insulin ([place]) 04/07/2018  . CKD (chronic kidney disease) stage 3, GFR 30-59 ml/min (HCC) 03/11/2018  . Accident due to mechanical fall without injury 11/20/2017  . AKI (acute kidney injury) ([place]) 11/20/2017  . Anemia 11/20/2017  . Hypoglycemia 11/19/2017  .  Essential hypertension 11/19/2017   Home Medication(s) Prior to Admission medications   Medication Sig Start Date End Date Taking? Authorizing Provider  acetaminophen (TYLENOL) 500 MG tablet Take 1,000 mg by mouth 2 (two) times daily.    Yes [provider]  amLODipine (NORVASC) 10 MG tablet Take 1 tablet (10 mg total) by mouth daily. 10/21/18  Yes Bonnielee Haff, MD  aspirin EC 81 MG tablet Take 1 tablet (81 mg total) by mouth daily. 03/06/18  Yes Billie Ruddy, MD  cholecalciferol (VITAMIN D) 25 MCG (1000 UT) tablet Take 1,000 Units by mouth daily.    Yes [provider]  Cyanocobalamin 2500 MCG CHEW Chew 2,500 mcg by mouth daily.   Yes [provider]  furosemide (LASIX) 20 MG tablet Take 20-40 mg by mouth See admin instructions. Take 2 tablets in the morning and take 1 tablet in the evening   Yes [provider]  gabapentin (NEURONTIN) 600 MG tablet Take 2 tablets (1,200 mg total) by mouth 3 (three) times daily. 05/29/18 08/05/26 Yes Jamse Arn, MD  hydrALAZINE (APRESOLINE) 100 MG tablet Take 1 tablet (100 mg total) by mouth every 8 (eight) hours. 10/21/18  Yes Bonnielee Haff, MD  insulin degludec (TRESIBA FLEXTOUCH) 100 UNIT/ML SOPN FlexTouch Pen Inject 0.05 mLs (5 Units total) into the skin daily. DO NOT TAKE IF CBG'S ARE LESS THAN 150 10/23/18  Yes Billie Ruddy, MD  isosorbide dinitrate (ISORDIL) 30 MG tablet Take 30 mg by mouth daily.   Yes [provider]  linaclotide (LINZESS) 290 MCG CAPS capsule Take 1 capsule (290 mcg total) by mouth daily before breakfast. 11/07/18  Yes Armbruster, Carlota Raspberry, MD  methocarbamol (ROBAXIN) 500 MG tablet TAKE 1 TABLET BY MOUTH TWICE DAILY AS NEEDED FOR MUSCLE SPASM Patient taking differently: Take 500 mg by mouth 2 (two) times daily as needed for muscle spasms.  08/19/18  Yes Jamse Arn, MD  metoprolol tartrate (LOPRESSOR) 50 MG tablet Take 100 mg by mouth daily. 11/03/18  Yes [provider]  montelukast (SINGULAIR) 10 MG tablet Take 1 tablet (10 mg total) by mouth at bedtime. Patient taking differently: Take 10 mg by mouth daily.  03/06/18  Yes Billie Ruddy, MD  omeprazole (PRILOSEC) 20 MG capsule Take 20 mg by mouth 2 (two) times daily.   Yes [provider]  rosuvastatin (CRESTOR) 20 MG tablet Take 1 tablet (20 mg total) by mouth daily at 6 PM. 11/25/18  Yes Billie Ruddy, MD  ticagrelor (BRILINTA) 90 MG TABS tablet Take 1 tablet (90 mg total) by mouth 2 (two) times daily. 11/25/18  Yes Billie Ruddy, MD  vitamin E (VITAMIN E) 1000 UNIT capsule Take 1,000 Units by mouth daily.    Yes [provider]  Blood Glucose Monitoring Suppl (ACCU-CHEK GUIDE ME) w/Device KIT 1 Device by Does not apply route 4 (four) times daily -  before meals and at bedtime. 11/19/18   Billie Ruddy, MD  Continuous Blood Gluc Sensor (FREESTYLE LIBRE 14 DAY SENSOR) MISC 1 each by Does not apply route daily. 11/06/18   Billie Ruddy, MD  Insulin Pen Needle (BD PEN NEEDLE NANO U/F) 32G X 4 MM MISC USE AS DIRECTED FOR TRESIBA INJECTIONS ONCE DAILY 10/24/18   Billie Ruddy, MD  levofloxacin (LEVAQUIN) 500 MG tablet Take 1 tablet (500 mg total) by mouth daily. 11/26/18   Fatima Blank, MD  rosuvastatin (CRESTOR) 20 MG tablet Take 1 tablet (20 mg total) by mouth daily at 6 PM. Patient not taking: Reported on 11/26/2018 11/25/18   Billie Ruddy, MD  ticagrelor (BRILINTA) 90 MG TABS tablet Take 1 tablet (90 mg total) by mouth 2 (two) times daily. Patient not taking: Reported on 11/26/2018 11/25/18   Billie Ruddy, MD                                                                                                                                    Past Surgical History Past Surgical History:  Procedure Laterality Date  . CARDIAC CATHETERIZATION  10/20/2018  . CORONARY BALLOON ANGIOPLASTY N/A 10/20/2018   Procedure: CORONARY BALLOON ANGIOPLASTY;  Surgeon:  Nigel Mormon, MD;  Location: [place] [facility];  Service: Cardiovascular;  Laterality: N/A;  . JOINT REPLACEMENT    . LEFT HEART CATH AND CORONARY ANGIOGRAPHY N/A 10/20/2018   Procedure: LEFT HEART CATH AND CORONARY ANGIOGRAPHY;  Surgeon: Nigel Mormon,  MD;  Location: [place] [facility];  Service: Cardiovascular;  Laterality: N/A;  . TOTAL KNEE ARTHROPLASTY Right    Family History Family History  Problem Relation Age of Onset  . Hypertension Mother   . Diabetes Mother   . Hyperlipidemia Mother   . Hypertension Father   . Hypertension Sister   . Cancer Sister     Social History Social History   Tobacco Use  . Smoking status: Former Smoker    Packs/day: 0.33    Years: 20.00    Pack years: 6.60    Types: Cigarettes    Last attempt to quit: 1985    Years since quitting: 35.0  . Smokeless tobacco: Never Used  Substance Use Topics  . Alcohol use: Yes    Comment: 10/16/2018 "couple drinks/year"  . Drug use: Never   Allergies Patient has no known allergies.  Review of Systems Review of Systems All other systems are reviewed and are negative for acute change except as noted in the HPI  Physical Exam Vital Signs  I have reviewed the triage vital signs BP (!) 183/81   Pulse 85   Temp 99.2 F (37.3 C) (Rectal)   Resp 15   SpO2 95%   Physical Exam Vitals signs reviewed.  Constitutional:      General: He is not in acute distress.    Appearance: He is well-developed. He is not diaphoretic.  HENT:     Head: Normocephalic and atraumatic.     Nose: Nose normal.     Comments: Dried blood in bilateral nares Eyes:     General: No scleral icterus.       Right eye: No discharge.        Left eye: No discharge.     Conjunctiva/sclera: Conjunctivae normal.     Pupils: Pupils are equal, round, and reactive to light.  Neck:     Musculoskeletal: Normal range of motion and neck supple.  Cardiovascular:     Rate and Rhythm: Normal rate and regular rhythm.      Heart sounds: No murmur. No friction rub. No gallop.   Pulmonary:     Effort: Pulmonary effort is normal. No respiratory distress.     Breath sounds: Normal breath sounds. No stridor. No rales.  Abdominal:     General: There is no distension.     Palpations: Abdomen is soft.     Tenderness: There is no abdominal tenderness.  Musculoskeletal:        General: No tenderness.  Skin:    General: Skin is warm and dry.     Findings: No erythema or rash.  Neurological:     Mental Status: He is alert and oriented to person, place, and time.     ED Results and Treatments Labs (all labs ordered are listed, but only abnormal results are displayed) Labs Reviewed  BASIC METABOLIC PANEL - Abnormal; Notable for the following components:      Result Value   Glucose, Bld 131 (*)    BUN 25 (*)    Creatinine, Ser 2.18 (*)    Calcium 8.3 (*)    GFR calc non Af Amer 30 (*)    GFR calc Af Amer 35 (*)    All other components within normal limits  CBC - Abnormal; Notable for the following components:   RBC 3.10 (*)    Hemoglobin 8.9 (*)    HCT 28.1 (*)    All other components within normal limits  BRAIN NATRIURETIC PEPTIDE -  Abnormal; Notable for the following components:   B Natriuretic Peptide 503.8 (*)    All other components within normal limits  CBG MONITORING, ED - Abnormal; Notable for the following components:   Glucose-Capillary 111 (*)    All other components within normal limits  CBG MONITORING, ED - Abnormal; Notable for the following components:   Glucose-Capillary 150 (*)    All other components within normal limits  LACTIC ACID, PLASMA  INFLUENZA PANEL BY PCR (TYPE A & B)  I-STAT TROPONIN, ED  I-STAT TROPONIN, ED                                                                                                                         EKG  EKG Interpretation  Date/Time:  Wednesday November 26 2018 03:08:50 EST Ventricular Rate:  82 PR Interval:    QRS Duration: 95 QT  Interval:  371 QTC Calculation: 434 R Axis:   40 Text Interpretation:  Sinus rhythm Probable left ventricular hypertrophy No significant change since last tracing Confirmed by Addison Lank [phone]) on 11/26/2018 3:40:56 AM      Radiology Dg Chest 2 View  Result Date: 11/25/2018 CLINICAL DATA:  Hemoptysis EXAM: CHEST - 2 VIEW COMPARISON:  11/18/2018 FINDINGS: Patchy airspace disease throughout the right lung, new since prior study. Cardiomegaly. Small left effusion. No right effusion. No acute bony abnormality. IMPRESSION: Patchy right lung airspace disease compatible with pneumonia. Stable residual small left effusion. Cardiomegaly. Electronically Signed   By: Rolm Baptise M.D.   On: 11/25/2018 22:12   Pertinent labs & imaging results that were available during my care of the patient were reviewed by me and considered in my medical decision making (see chart for details).  Medications Ordered in ED Medications  sodium chloride flush (NS) 0.9 % injection 3 mL (3 mLs Intravenous Given 11/26/18 0206)  levofloxacin (LEVAQUIN) tablet 750 mg (750 mg Oral Given 11/26/18 0432)                                                                                                                                    Procedures Procedures  (including critical care time)  Medical Decision Making / ED Course I have reviewed the nursing notes for this encounter and the patient's prior records (if available in EHR or on provided paperwork).    Patient has low-grade temp. well-appearing and well-hydrated.  Work-up notable for right  sided pneumonia.  Patient without leukocytosis or evidence of sepsis.  Does not appear to be in CHF exacerbation with a stable BNP.  Rest of the labs were close to patient's baseline and reassuring.  Hemoptysis may be from prior epistaxis versus the pneumonia.  Both curb 65 and port score revealed moderate risk but patient declined admission, requesting to be discharged with oral  antibiotics.  Patient was made aware of the risk.  He was given strict return precautions.  First dose of antibiotics given in the emergency department.  Final Clinical Impression(s) / ED Diagnoses Final diagnoses:  Multifocal pneumonia   Disposition: Discharge  Condition: Good  I have discussed the results, Dx and Tx plan with the patient and daughter who expressed understanding and agree(s) with the plan. Discharge instructions discussed at great length. The patient and daughter were given strict return precautions who verbalized understanding of the instructions. No further questions at time of discharge.    ED Discharge Orders         Ordered    levofloxacin (LEVAQUIN) 500 MG tablet  Daily     11/26/18 0300           Follow Up: Billie Ruddy, MD [place] [id number] [phone]  Schedule an appointment as soon as possible for a visit  in 3-5 days, For close follow up      This chart was dictated using voice recognition software.  Despite best efforts to proofread,  errors can occur which can change the documentation meaning.   Fatima Blank, MD 11/26/18 [phone]

## 2018-11-26 NOTE — Telephone Encounter (Signed)
Pt's daughter Shelbie Hutching would like to have [place] give her a call. Please advise.

## 2018-11-26 NOTE — ED Notes (Signed)
Patient verbalizes understanding of discharge instructions. Opportunity for questioning and answers were provided. Armband removed by staff, pt discharged from ED home via POV with family. 

## 2018-11-26 NOTE — ED Notes (Signed)
Pt reports going to his GI doctor and getting diagnosed with an esophageal tear. Daughter reports the pt coughed up a scant about of blood and she got concerned so she brought him into the ED. Pt reports he only coughed 1 time.

## 2018-11-28 ENCOUNTER — Inpatient Hospital Stay: Payer: BLUE CROSS/BLUE SHIELD | Admitting: Family Medicine

## 2018-11-28 NOTE — Telephone Encounter (Signed)
Spoke with William Webb daughter advised that dr Volanda Napoleon needs to see William Webb before sending any muscle relaxer medication for refill. William Webb daughter is aware that home health orders will be placed after she speaks to the William Webb.William Webb is scheduled for a F/U on 12/02/2018.

## 2018-11-28 NOTE — Telephone Encounter (Signed)
Pt checking status on home health coming out.

## 2018-11-30 ENCOUNTER — Other Ambulatory Visit: Payer: Self-pay | Admitting: Cardiology

## 2018-11-30 DIAGNOSIS — I2510 Atherosclerotic heart disease of native coronary artery without angina pectoris: Secondary | ICD-10-CM

## 2018-11-30 DIAGNOSIS — I6529 Occlusion and stenosis of unspecified carotid artery: Secondary | ICD-10-CM

## 2018-12-01 ENCOUNTER — Emergency Department (HOSPITAL_COMMUNITY): Payer: BLUE CROSS/BLUE SHIELD

## 2018-12-01 ENCOUNTER — Observation Stay (HOSPITAL_COMMUNITY)
Admission: EM | Admit: 2018-12-01 | Discharge: 2018-12-04 | Disposition: A | Discharge status: Home / Self Care | Payer: BLUE CROSS/BLUE SHIELD | Attending: Internal Medicine | Admitting: Internal Medicine

## 2018-12-01 ENCOUNTER — Inpatient Hospital Stay: Payer: BLUE CROSS/BLUE SHIELD | Admitting: Family Medicine

## 2018-12-01 DIAGNOSIS — I252 Old myocardial infarction: Secondary | ICD-10-CM | POA: Diagnosis not present

## 2018-12-01 DIAGNOSIS — Z87891 Personal history of nicotine dependence: Secondary | ICD-10-CM | POA: Insufficient documentation

## 2018-12-01 DIAGNOSIS — Z79899 Other long term (current) drug therapy: Secondary | ICD-10-CM | POA: Insufficient documentation

## 2018-12-01 DIAGNOSIS — I10 Essential (primary) hypertension: Secondary | ICD-10-CM | POA: Diagnosis present

## 2018-12-01 DIAGNOSIS — I214 Non-ST elevation (NSTEMI) myocardial infarction: Secondary | ICD-10-CM | POA: Diagnosis present

## 2018-12-01 DIAGNOSIS — K219 Gastro-esophageal reflux disease without esophagitis: Secondary | ICD-10-CM | POA: Insufficient documentation

## 2018-12-01 DIAGNOSIS — E1122 Type 2 diabetes mellitus with diabetic chronic kidney disease: Secondary | ICD-10-CM

## 2018-12-01 DIAGNOSIS — G894 Chronic pain syndrome: Secondary | ICD-10-CM | POA: Diagnosis not present

## 2018-12-01 DIAGNOSIS — I129 Hypertensive chronic kidney disease with stage 1 through stage 4 chronic kidney disease, or unspecified chronic kidney disease: Secondary | ICD-10-CM | POA: Diagnosis not present

## 2018-12-01 DIAGNOSIS — D631 Anemia in chronic kidney disease: Secondary | ICD-10-CM | POA: Diagnosis not present

## 2018-12-01 DIAGNOSIS — R296 Repeated falls: Secondary | ICD-10-CM | POA: Diagnosis not present

## 2018-12-01 DIAGNOSIS — Z23 Encounter for immunization: Secondary | ICD-10-CM | POA: Insufficient documentation

## 2018-12-01 DIAGNOSIS — D649 Anemia, unspecified: Secondary | ICD-10-CM | POA: Diagnosis present

## 2018-12-01 DIAGNOSIS — Z794 Long term (current) use of insulin: Secondary | ICD-10-CM | POA: Insufficient documentation

## 2018-12-01 DIAGNOSIS — R55 Syncope and collapse: Principal | ICD-10-CM | POA: Insufficient documentation

## 2018-12-01 DIAGNOSIS — I2510 Atherosclerotic heart disease of native coronary artery without angina pectoris: Secondary | ICD-10-CM | POA: Diagnosis not present

## 2018-12-01 DIAGNOSIS — N183 Chronic kidney disease, stage 3 unspecified: Secondary | ICD-10-CM

## 2018-12-01 DIAGNOSIS — E440 Moderate protein-calorie malnutrition: Secondary | ICD-10-CM

## 2018-12-01 DIAGNOSIS — Z7982 Long term (current) use of aspirin: Secondary | ICD-10-CM | POA: Insufficient documentation

## 2018-12-01 DIAGNOSIS — K5909 Other constipation: Secondary | ICD-10-CM | POA: Diagnosis not present

## 2018-12-01 DIAGNOSIS — E1129 Type 2 diabetes mellitus with other diabetic kidney complication: Secondary | ICD-10-CM | POA: Diagnosis present

## 2018-12-01 DIAGNOSIS — M1990 Unspecified osteoarthritis, unspecified site: Secondary | ICD-10-CM | POA: Diagnosis not present

## 2018-12-01 DIAGNOSIS — E785 Hyperlipidemia, unspecified: Secondary | ICD-10-CM | POA: Diagnosis not present

## 2018-12-01 DIAGNOSIS — D509 Iron deficiency anemia, unspecified: Secondary | ICD-10-CM | POA: Diagnosis not present

## 2018-12-01 DIAGNOSIS — J479 Bronchiectasis, uncomplicated: Secondary | ICD-10-CM | POA: Diagnosis not present

## 2018-12-01 DIAGNOSIS — E1142 Type 2 diabetes mellitus with diabetic polyneuropathy: Secondary | ICD-10-CM | POA: Diagnosis not present

## 2018-12-01 DIAGNOSIS — N1831 Chronic kidney disease, stage 3a: Secondary | ICD-10-CM | POA: Diagnosis present

## 2018-12-01 DIAGNOSIS — Z8249 Family history of ischemic heart disease and other diseases of the circulatory system: Secondary | ICD-10-CM | POA: Insufficient documentation

## 2018-12-01 DIAGNOSIS — E43 Unspecified severe protein-calorie malnutrition: Secondary | ICD-10-CM

## 2018-12-01 DIAGNOSIS — Z955 Presence of coronary angioplasty implant and graft: Secondary | ICD-10-CM | POA: Diagnosis not present

## 2018-12-01 LAB — CBC WITH DIFFERENTIAL/PLATELET
Abs Immature Granulocytes: 0.04 10*3/uL (ref 0.00–0.07)
Basophils Absolute: 0 10*3/uL (ref 0.0–0.1)
Basophils Relative: 0 %
Eosinophils Absolute: 0.1 10*3/uL (ref 0.0–0.5)
Eosinophils Relative: 1 %
HCT: 25.5 % — ABNORMAL LOW (ref 39.0–52.0)
Hemoglobin: 8 g/dL — ABNORMAL LOW (ref 13.0–17.0)
Immature Granulocytes: 0 %
Lymphocytes Relative: 6 %
Lymphs Abs: 0.5 10*3/uL — ABNORMAL LOW (ref 0.7–4.0)
MCH: 27.2 pg (ref 26.0–34.0)
MCHC: 31.4 g/dL (ref 30.0–36.0)
MCV: 86.7 fL (ref 80.0–100.0)
Monocytes Absolute: 0.4 10*3/uL (ref 0.1–1.0)
Monocytes Relative: 4 %
Neutro Abs: 8.6 10*3/uL — ABNORMAL HIGH (ref 1.7–7.7)
Neutrophils Relative %: 89 %
Platelets: 275 10*3/uL (ref 150–400)
RBC: 2.94 MIL/uL — ABNORMAL LOW (ref 4.22–5.81)
RDW: 13.4 % (ref 11.5–15.5)
WBC: 9.7 10*3/uL (ref 4.0–10.5)
nRBC: 0 % (ref 0.0–0.2)

## 2018-12-01 LAB — BASIC METABOLIC PANEL
Anion gap: 10 (ref 5–15)
BUN: 28 mg/dL — ABNORMAL HIGH (ref 8–23)
CO2: 22 mmol/L (ref 22–32)
Calcium: 8.2 mg/dL — ABNORMAL LOW (ref 8.9–10.3)
Chloride: 105 mmol/L (ref 98–111)
Creatinine, Ser: 2.32 mg/dL — ABNORMAL HIGH (ref 0.61–1.24)
GFR calc Af Amer: 32 mL/min — ABNORMAL LOW (ref >60)
GFR calc non Af Amer: 28 mL/min — ABNORMAL LOW (ref >60)
Glucose, Bld: 138 mg/dL — ABNORMAL HIGH (ref 70–99)
Potassium: 4.3 mmol/L (ref 3.5–5.1)
Sodium: 137 mmol/L (ref 135–145)

## 2018-12-01 LAB — TROPONIN I: Troponin I: 0.03 ng/mL (ref <0.03)

## 2018-12-01 LAB — CBG MONITORING, ED: Glucose-Capillary: 121 mg/dL — ABNORMAL HIGH (ref 70–99)

## 2018-12-01 MED ORDER — ENOXAPARIN SODIUM 40 MG/0.4ML ~~LOC~~ SOLN: 40.0000 mg | SUBCUTANEOUS | Status: DC

## 2018-12-01 MED ORDER — GABAPENTIN 400 MG PO CAPS
1200.0000 mg | ORAL_CAPSULE | Freq: Three times a day (TID) | ORAL | Status: DC
  Administered 2018-12-02 – 2018-12-04 (×8): 1200 mg via ORAL
  Filled 2018-12-01 (×8): qty 3

## 2018-12-01 MED ORDER — LEVOFLOXACIN 500 MG PO TABS
500.0000 mg | ORAL_TABLET | Freq: Every day | ORAL | Status: AC
  Administered 2018-12-02: 500 mg via ORAL
  Filled 2018-12-01: qty 1

## 2018-12-01 MED ORDER — METHOCARBAMOL 500 MG PO TABS: 500.0000 mg | ORAL_TABLET | Freq: Two times a day (BID) | ORAL | Status: DC | PRN

## 2018-12-01 MED ORDER — ONDANSETRON HCL 4 MG/2ML IJ SOLN: 4.0000 mg | Freq: Four times a day (QID) | INTRAMUSCULAR | Status: DC | PRN

## 2018-12-01 MED ORDER — VITAMIN B-12 1000 MCG PO TABS
2500.0000 ug | ORAL_TABLET | Freq: Every day | ORAL | Status: DC
  Administered 2018-12-02 – 2018-12-04 (×3): 2500 ug via ORAL
  Filled 2018-12-01 (×3): qty 3

## 2018-12-01 MED ORDER — TICAGRELOR 90 MG PO TABS
90.0000 mg | ORAL_TABLET | Freq: Two times a day (BID) | ORAL | Status: DC
  Administered 2018-12-02: 90 mg via ORAL
  Filled 2018-12-01 (×2): qty 1

## 2018-12-01 MED ORDER — INSULIN ASPART 100 UNIT/ML ~~LOC~~ SOLN
0.0000 [IU] | SUBCUTANEOUS | Status: DC
  Administered 2018-12-02 (×3): 2 [IU] via SUBCUTANEOUS
  Administered 2018-12-03: 1 [IU] via SUBCUTANEOUS
  Administered 2018-12-04: 2 [IU] via SUBCUTANEOUS
  Administered 2018-12-04: 3 [IU] via SUBCUTANEOUS

## 2018-12-01 MED ORDER — METOPROLOL TARTRATE 50 MG PO TABS
50.0000 mg | ORAL_TABLET | Freq: Every day | ORAL | Status: DC
  Administered 2018-12-02 – 2018-12-04 (×3): 50 mg via ORAL
  Filled 2018-12-01 (×3): qty 1

## 2018-12-01 MED ORDER — VITAMIN D 25 MCG (1000 UNIT) PO TABS
1000.0000 [IU] | ORAL_TABLET | Freq: Every day | ORAL | Status: DC
  Administered 2018-12-02 – 2018-12-04 (×3): 1000 [IU] via ORAL
  Filled 2018-12-01 (×3): qty 1

## 2018-12-01 MED ORDER — VITAMIN E 180 MG (400 UNIT) PO CAPS
800.0000 [IU] | ORAL_CAPSULE | Freq: Every day | ORAL | Status: DC
  Administered 2018-12-02 – 2018-12-04 (×3): 800 [IU] via ORAL
  Filled 2018-12-01 (×3): qty 2

## 2018-12-01 MED ORDER — ISOSORBIDE DINITRATE 30 MG PO TABS: 30.0000 mg | ORAL_TABLET | Freq: Every day | ORAL | Status: DC

## 2018-12-01 MED ORDER — ASPIRIN EC 81 MG PO TBEC
81.0000 mg | DELAYED_RELEASE_TABLET | Freq: Every day | ORAL | Status: DC
  Administered 2018-12-02: 81 mg via ORAL
  Filled 2018-12-01: qty 1

## 2018-12-01 MED ORDER — PANTOPRAZOLE SODIUM 40 MG PO TBEC: 40.0000 mg | DELAYED_RELEASE_TABLET | Freq: Every day | ORAL | Status: DC

## 2018-12-01 MED ORDER — PANTOPRAZOLE SODIUM 40 MG PO TBEC
80.0000 mg | DELAYED_RELEASE_TABLET | Freq: Two times a day (BID) | ORAL | Status: DC
  Administered 2018-12-02 (×2): 80 mg via ORAL
  Filled 2018-12-01 (×3): qty 2

## 2018-12-01 MED ORDER — ACETAMINOPHEN 500 MG PO TABS
1000.0000 mg | ORAL_TABLET | Freq: Two times a day (BID) | ORAL | Status: DC
  Administered 2018-12-02 – 2018-12-04 (×5): 1000 mg via ORAL
  Filled 2018-12-01 (×6): qty 2

## 2018-12-01 MED ORDER — MONTELUKAST SODIUM 10 MG PO TABS
10.0000 mg | ORAL_TABLET | Freq: Every day | ORAL | Status: DC
  Administered 2018-12-02 – 2018-12-04 (×3): 10 mg via ORAL
  Filled 2018-12-01 (×3): qty 1

## 2018-12-01 MED ORDER — HYDRALAZINE HCL 50 MG PO TABS
100.0000 mg | ORAL_TABLET | Freq: Three times a day (TID) | ORAL | Status: DC
  Administered 2018-12-02 – 2018-12-04 (×7): 100 mg via ORAL
  Filled 2018-12-01 (×9): qty 2

## 2018-12-01 MED ORDER — ACETAMINOPHEN 325 MG PO TABS: 650.0000 mg | ORAL_TABLET | ORAL | Status: DC | PRN

## 2018-12-01 MED ORDER — LINACLOTIDE 145 MCG PO CAPS
290.0000 ug | ORAL_CAPSULE | Freq: Every day | ORAL | Status: DC | PRN
  Filled 2018-12-01: qty 2

## 2018-12-01 MED ORDER — AMLODIPINE BESYLATE 10 MG PO TABS
10.0000 mg | ORAL_TABLET | Freq: Every day | ORAL | Status: DC
  Administered 2018-12-02 – 2018-12-04 (×3): 10 mg via ORAL
  Filled 2018-12-01 (×3): qty 1

## 2018-12-01 MED ORDER — ROSUVASTATIN CALCIUM 20 MG PO TABS
20.0000 mg | ORAL_TABLET | Freq: Every day | ORAL | Status: DC
  Administered 2018-12-02 – 2018-12-03 (×2): 20 mg via ORAL
  Filled 2018-12-01 (×2): qty 1

## 2018-12-01 MED ORDER — SODIUM CHLORIDE 0.9 % IV BOLUS
500.0000 mL | Freq: Once | INTRAVENOUS | Status: AC
  Administered 2018-12-01: 500 mL via INTRAVENOUS

## 2018-12-01 NOTE — ED Triage Notes (Signed)
Pt arrives via EMS from home where he had multiple syncopal events today. Both events did result in falls. Pt is on brilinta. Recently seen and dx with PNA and Flu and currently taking abx. Initial BP with EMS was in the 70's. Given 450cc NS en route. 140/70, HR 70, RR 20, 100% RA.

## 2018-12-01 NOTE — ED Notes (Signed)
Pt refusing nasal flu swab due to previous nasal blood clot dislodgment when being swabbed.

## 2018-12-01 NOTE — ED Provider Notes (Signed)
[facility] EMERGENCY DEPARTMENT Provider Note   CSN: [id number] Arrival date & time: 12/01/18  1942     History   Chief Complaint Chief Complaint  Patient presents with  . Loss of Consciousness    HPI William Webb is a 67 y.o. male.  Patient is a 68 year old male who presents after syncopal episode.  He has a history of diabetes, hypertension, hyperlipidemia and chronic kidney disease.  He was seen here on January 29 with respiratory symptoms.  He was diagnosed with pneumonia but he did not want to be admitted at that time.  He states that his coughing is gotten better but he has not had a good appetite and today he felt very fatigued.  He felt like he had to have an episode of diarrhea and he stood up to walk to the bathroom and had a near syncopal event.  He then fell 3 more times on the way to the bathroom.  He denies hitting his head.  He denies any injuries from the fall.  He states he was just feeling very fatigued and dizzy/lightheaded.  He also had some burning to the center of his chest.  He describes as indigestion type feeling.  He had no shortness of breath.  He states he has had some similar type indigestion with cardiac events in the past.     Past Medical History:  Diagnosis Date  . Anemia   . CKD (chronic kidney disease) stage 3, GFR 30-59 ml/min (HCC)   . Coronary artery disease   . Diabetic peripheral neuropathy ([place])   . GERD (gastroesophageal reflux disease)   . Hyperlipidemia   . Hypertension   . Hypertensive crisis 10/16/2018  . Noncompliance with medication regimen   . Osteoarthritis    "legs, back" (10/16/2018)  . Pneumonia 01-25-2016   "real bad; I died and they had to bring me back" (10/16/2018)  . Seasonal allergies   . Type II diabetes mellitus [facility])     Patient Active Problem List   Diagnosis Date Noted  . DOE (dyspnea on exertion) 11/18/2018  . NSTEMI (non-ST elevated myocardial infarction) ([place]) 10/16/2018  .  Hypertensive crisis 10/16/2018  . Hypertension   . Hyperlipidemia   . Diabetes mellitus with renal complications ([place])   . Noncompliance with medication regimen   . Chronic pain syndrome 05/29/2018  . Type 2 diabetes mellitus with stage 3 chronic kidney disease, with long-term current use of insulin ([place]) 04/07/2018  . CKD (chronic kidney disease) stage 3, GFR 30-59 ml/min (HCC) 03/11/2018  . Accident due to mechanical fall without injury 11/20/2017  . AKI (acute kidney injury) ([place]) 11/20/2017  . Anemia 11/20/2017  . Hypoglycemia 11/19/2017  . Essential hypertension 11/19/2017    Past Surgical History:  Procedure Laterality Date  . CARDIAC CATHETERIZATION  10/20/2018  . CORONARY BALLOON ANGIOPLASTY N/A 10/20/2018   Procedure: CORONARY BALLOON ANGIOPLASTY;  Surgeon: Nigel Mormon, MD;  Location: [facility] [facility];  Service: Cardiovascular;  Laterality: N/A;  . JOINT REPLACEMENT    . LEFT HEART CATH AND CORONARY ANGIOGRAPHY N/A 10/20/2018   Procedure: LEFT HEART CATH AND CORONARY ANGIOGRAPHY;  Surgeon: Nigel Mormon, MD;  Location: [place];  Service: Cardiovascular;  Laterality: N/A;  . TOTAL KNEE ARTHROPLASTY Right         Home Medications    Prior to Admission medications   Medication Sig Start Date End Date Taking? Authorizing Provider  acetaminophen (TYLENOL) 500 MG tablet Take 1,000  mg by mouth 2 (two) times daily.    Yes [provider]  amLODipine (NORVASC) 10 MG tablet Take 1 tablet (10 mg total) by mouth daily. 10/21/18  Yes Bonnielee Haff, MD  aspirin EC 81 MG tablet Take 1 tablet (81 mg total) by mouth daily. 03/06/18  Yes Billie Ruddy, MD  cholecalciferol (VITAMIN D) 25 MCG (1000 UT) tablet Take 1,000 Units by mouth daily.    Yes [provider]  Cyanocobalamin 2500 MCG CHEW Chew 2,500 mcg by mouth daily.   Yes [provider]  furosemide (LASIX) 20 MG tablet Take 20-40 mg by mouth See admin instructions. Take 2  tablets in the morning and take 1 tablet in the evening   Yes [provider]  gabapentin (NEURONTIN) 600 MG tablet Take 2 tablets (1,200 mg total) by mouth 3 (three) times daily. 05/29/18 08/05/26 Yes Jamse Arn, MD  hydrALAZINE (APRESOLINE) 100 MG tablet Take 1 tablet (100 mg total) by mouth every 8 (eight) hours. 10/21/18  Yes Bonnielee Haff, MD  insulin degludec (TRESIBA FLEXTOUCH) 100 UNIT/ML SOPN FlexTouch Pen Inject 0.05 mLs (5 Units total) into the skin daily. DO NOT TAKE IF CBG'S ARE LESS THAN 150 Patient taking differently: Inject 5-10 Units into the skin daily. DO NOT TAKE IF CBG'S ARE LESS THAN 150 10/23/18  Yes Billie Ruddy, MD  isosorbide dinitrate (ISORDIL) 30 MG tablet Take 30 mg by mouth daily.   Yes [provider]  levofloxacin (LEVAQUIN) 500 MG tablet Take 1 tablet (500 mg total) by mouth daily. 11/26/18  Yes Cardama, Grayce Sessions, MD  linaclotide [facility]) 290 MCG CAPS capsule Take 1 capsule (290 mcg total) by mouth daily before breakfast. Patient taking differently: Take 290 mcg by mouth daily as needed (constipation).  11/07/18  Yes Armbruster, Carlota Raspberry, MD  methocarbamol (ROBAXIN) 500 MG tablet TAKE 1 TABLET BY MOUTH TWICE DAILY AS NEEDED FOR MUSCLE SPASM Patient taking differently: Take 500 mg by mouth 2 (two) times daily as needed for muscle spasms.  08/19/18  Yes Jamse Arn, MD  metoprolol tartrate (LOPRESSOR) 50 MG tablet Take 50 mg by mouth daily.  11/03/18  Yes [provider]  montelukast (SINGULAIR) 10 MG tablet Take 1 tablet (10 mg total) by mouth at bedtime. Patient taking differently: Take 10 mg by mouth daily.  03/06/18  Yes Billie Ruddy, MD  omeprazole (PRILOSEC) 20 MG capsule Take 20 mg by mouth 2 (two) times daily.   Yes [provider]  rosuvastatin (CRESTOR) 20 MG tablet Take 1 tablet (20 mg total) by mouth daily at 6 PM. 11/25/18  Yes Billie Ruddy, MD  ticagrelor (BRILINTA) 90 MG TABS tablet Take 1 tablet  (90 mg total) by mouth 2 (two) times daily. 11/25/18  Yes Billie Ruddy, MD  vitamin E (VITAMIN E) 1000 UNIT capsule Take 1,000 Units by mouth daily.    Yes [provider]  Blood Glucose Monitoring Suppl (ACCU-CHEK GUIDE ME) w/Device KIT 1 Device by Does not apply route 4 (four) times daily -  before meals and at bedtime. 11/19/18   Billie Ruddy, MD  Continuous Blood Gluc Sensor (FREESTYLE LIBRE 14 DAY SENSOR) MISC 1 each by Does not apply route daily. 11/06/18   Billie Ruddy, MD  Insulin Pen Needle (BD PEN NEEDLE NANO U/F) 32G X 4 MM MISC USE AS DIRECTED FOR TRESIBA INJECTIONS ONCE DAILY 10/24/18   Billie Ruddy, MD    Family History Family History  Problem Relation Age of Onset  . Hypertension Mother   . Diabetes Mother   . Hyperlipidemia Mother   . Hypertension Father   . Hypertension Sister   . Cancer Sister     Social History Social History   Tobacco Use  . Smoking status: Former Smoker    Packs/day: 0.33    Years: 20.00    Pack years: 6.60    Types: Cigarettes    Last attempt to quit: 1985    Years since quitting: 35.1  . Smokeless tobacco: Never Used  Substance Use Topics  . Alcohol use: Yes    Comment: 10/16/2018 "couple drinks/year"  . Drug use: Never     Allergies   Patient has no known allergies.   Review of Systems Review of Systems  Constitutional: Positive for chills and fatigue. Negative for diaphoresis and fever.  HENT: Positive for congestion and rhinorrhea. Negative for sneezing.   Eyes: Negative.   Respiratory: Positive for cough. Negative for chest tightness and shortness of breath.   Cardiovascular: Positive for chest pain. Negative for leg swelling.  Gastrointestinal: Negative for abdominal pain, blood in stool, diarrhea, nausea and vomiting.  Genitourinary: Negative for difficulty urinating, flank pain, frequency and hematuria.  Musculoskeletal: Negative for arthralgias and back pain.  Skin: Negative for rash.    Neurological: Positive for light-headedness. Negative for dizziness, speech difficulty, weakness, numbness and headaches.     Physical Exam Updated Vital Signs BP (!) 165/90   Pulse 78   Temp 98.1 F (36.7 C) (Oral)   Resp 14   SpO2 99%   Physical Exam Constitutional:      Appearance: He is well-developed.  HENT:     Head: Normocephalic and atraumatic.  Eyes:     Pupils: Pupils are equal, round, and reactive to light.  Neck:     Musculoskeletal: Normal range of motion and neck supple.  Cardiovascular:     Rate and Rhythm: Normal rate and regular rhythm.     Heart sounds: Normal heart sounds.  Pulmonary:     Effort: Pulmonary effort is normal. No respiratory distress.     Breath sounds: Normal breath sounds. No wheezing or rales.  Chest:     Chest wall: No tenderness.  Abdominal:     General: Bowel sounds are normal.     Palpations: Abdomen is soft.     Tenderness: There is no abdominal tenderness. There is no guarding or rebound.  Musculoskeletal: Normal range of motion.  Lymphadenopathy:     Cervical: No cervical adenopathy.  Skin:    General: Skin is warm and dry.     Findings: No rash.  Neurological:     General: No focal deficit present.     Mental Status: He is alert and oriented to person, place, and time.      ED Treatments / Results  Labs (all labs ordered are listed, but only abnormal results are displayed) Labs Reviewed  BASIC METABOLIC PANEL - Abnormal; Notable for the following components:      Result Value   Glucose, Bld 138 (*)    BUN 28 (*)    Creatinine, Ser 2.32 (*)    Calcium 8.2 (*)    GFR calc non Af Amer 28 (*)    GFR calc Af Amer 32 (*)    All other components within normal limits  TROPONIN I - Abnormal; Notable for the following components:   Troponin I 0.03 (*)    All other components within normal limits  CBC WITH DIFFERENTIAL/PLATELET - Abnormal; Notable for the following components:   RBC 2.94 (*)    Hemoglobin 8.0 (*)     HCT 25.5 (*)    Neutro Abs 8.6 (*)    Lymphs Abs 0.5 (*)    All other components within normal limits  CBG MONITORING, ED - Abnormal; Notable for the following components:   Glucose-Capillary 121 (*)    All other components within normal limits  INFLUENZA PANEL BY PCR (TYPE A & B)    EKG EKG Interpretation  Date/Time:  Monday December 01 2018 20:25:47 EST Ventricular Rate:  79 PR Interval:    QRS Duration: 95 QT Interval:  400 QTC Calculation: 459 R Axis:   31 Text Interpretation:  Sinus rhythm Abnormal T, consider ischemia, lateral leads ST elevation, consider anterior injury Confirmed by Malvin Johns [phone]) on 12/01/2018 8:49:35 PM   Radiology Dg Chest 2 View  Result Date: 12/01/2018 CLINICAL DATA:  Heartburn, weakness, multiple falls, and nausea for 1 day. Former smoker. History of diabetes and hypertension. EXAM: CHEST - 2 VIEW COMPARISON:  11/25/2018 FINDINGS: Shallow inspiration. Cardiac enlargement. Chronic blunting of left costophrenic angle suggesting pleural thickening. Infiltration previously seen in the right lung has mostly resolved. No developing consolidation. No pneumothorax. Bronchiectasis demonstrated in the right mid lung. Soft tissue metallic density posterior to the thoracic spine. Degenerative changes in the spine. IMPRESSION: Cardiac enlargement. Chronic blunting of left costophrenic angle. No evidence of active pulmonary disease. Electronically Signed   By: Lucienne Capers M.D.   On: 12/01/2018 21:34    Procedures Procedures (including critical care time)  Medications Ordered in ED Medications  sodium chloride 0.9 % bolus 500 mL (0 mLs Intravenous Stopped 12/01/18 2214)     Initial Impression / Assessment and Plan / ED Course  I have reviewed the triage vital signs and the nursing notes.  Pertinent labs & imaging results that were available during my care of the patient were reviewed by me and considered in my medical decision making (see chart for  details).     Patient is a 68 year old male who presents after near syncopal episodes with several falls.  He denies any head injury.  He denies any injuries from the fall.  During that time he had some indigestion which he said felt like his prior heart attack.  His initial EKG showed some minimal uptake of the ST segment in lead II and III.  This resolved on a repeat EKG.  He is pain-free after some orange juice.  His troponin is mildly elevated.  I spoke with his cardiologist, Dr. Telford Nab who feels patient can be admitted to the hospitalist service and he will see the patient in the morning.  He feels like the patient is going to benefit from ongoing medical management of his cardiac symptoms.  At this point he does not recommend anticoagulation unless his troponins become more elevated.  His other labs show a slightly worsening of his chronic anemia.  He was initially hypotensive per family on scene but he has not been hypotensive here.  He was given IV fluids and is feeling better.  I will consult hospitalist for admission.  I spoke with Dr. Alcario Drought who will admit the pt.  CRITICAL CARE Performed by: Malvin Johns Total critical care time: 45 minutes Critical care time was exclusive of separately billable procedures and treating other patients. Critical care was necessary to treat or prevent imminent or life-threatening deterioration. Critical care was time spent personally by me  on the following activities: development of treatment plan with patient and/or surrogate as well as nursing, discussions with consultants, evaluation of patient's response to treatment, examination of patient, obtaining history from patient or surrogate, ordering and performing treatments and interventions, ordering and review of laboratory studies, ordering and review of radiographic studies, pulse oximetry and re-evaluation of patient's condition.   Final Clinical Impressions(s) / ED Diagnoses   Final  diagnoses:  Near syncope  NSTEMI (non-ST elevated myocardial infarction) [facility])    ED Discharge Orders    None       Malvin Johns, MD 12/01/18 2255

## 2018-12-01 NOTE — H&P (Signed)
History and Physical    William Webb:[id number] DOB: 06-20-1951 DOA: 12/01/2018  PCP: Billie Ruddy, MD  Patient coming from: Home  I have personally briefly reviewed patient's old medical records in [place]  Chief Complaint: Syncope  HPI: William Webb is a 68 y.o. male with medical history significant of DM2, CKD stage 3, HTN, HLD, CAD s/p stent for NSTEMI in late Dec 2019.  Patient diagnosed with PNA on 11/25/2018, declined admission to hospital so sent home on Levaquin.  Coughing has improved since then.  For past 2-3 days has had black tarry stool.  Today he felt like he had to have an episode of diarrhea and he stood up to walk to the bathroom and had a near syncopal event.  He then fell 3 more times on the way to the bathroom.  He denies hitting his head.  He denies any injuries from the fall.  He states he was just feeling very fatigued and dizzy/lightheaded.  He also had some burning to the center of his chest.  He describes as indigestion type feeling.  He had no shortness of breath.  He states he has had some similar type indigestion with cardiac events in the past.  Patient notes he hasnt actually had any diarrhea and stool has been soft but formed and black in color.   ED Course: Trop 0.03, EKG without change, HGB 8.0 down from 8.9 last week.  BPs 709G-283M systolic.  Got 500cc NS in ED.  Cardiology will see patient in AM, hospitalist asked to admit.   Review of Systems: As per HPI otherwise 10 point review of systems negative.   Past Medical History:  Diagnosis Date  . Anemia   . CKD (chronic kidney disease) stage 3, GFR 30-59 ml/min (HCC)   . Coronary artery disease   . Diabetic peripheral neuropathy ([place])   . GERD (gastroesophageal reflux disease)   . Hyperlipidemia   . Hypertension   . Hypertensive crisis 10/16/2018  . Noncompliance with medication regimen   . Osteoarthritis    "legs, back" (10/16/2018)  . Pneumonia 2016-01-25   "real bad; I died and they had to bring me back" (10/16/2018)  . Seasonal allergies   . Type II diabetes mellitus ([place])     Past Surgical History:  Procedure Laterality Date  . CARDIAC CATHETERIZATION  10/20/2018  . CORONARY BALLOON ANGIOPLASTY N/A 10/20/2018   Procedure: CORONARY BALLOON ANGIOPLASTY;  Surgeon: Nigel Mormon, MD;  Location: [place] [facility];  Service: Cardiovascular;  Laterality: N/A;  . JOINT REPLACEMENT    . LEFT HEART CATH AND CORONARY ANGIOGRAPHY N/A 10/20/2018   Procedure: LEFT HEART CATH AND CORONARY ANGIOGRAPHY;  Surgeon: Nigel Mormon, MD;  Location: [place] [facility];  Service: Cardiovascular;  Laterality: N/A;  . TOTAL KNEE ARTHROPLASTY Right      reports that he quit smoking about 35 years ago. His smoking use included cigarettes. He has a 6.60 pack-year smoking history. He has never used smokeless tobacco. He reports current alcohol use. He reports that he does not use drugs.  No Known Allergies  Family History  Problem Relation Age of Onset  . Hypertension Mother   . Diabetes Mother   . Hyperlipidemia Mother   . Hypertension Father   . Hypertension Sister   . Cancer Sister      Prior to Admission medications   Medication Sig Start Date End Date Taking? Authorizing Provider  acetaminophen (TYLENOL) 500 MG tablet Take  1,000 mg by mouth 2 (two) times daily.    Yes [provider]  amLODipine (NORVASC) 10 MG tablet Take 1 tablet (10 mg total) by mouth daily. 10/21/18  Yes Bonnielee Haff, MD  aspirin EC 81 MG tablet Take 1 tablet (81 mg total) by mouth daily. 03/06/18  Yes Billie Ruddy, MD  cholecalciferol (VITAMIN D) 25 MCG (1000 UT) tablet Take 1,000 Units by mouth daily.    Yes [provider]  Cyanocobalamin 2500 MCG CHEW Chew 2,500 mcg by mouth daily.   Yes [provider]  furosemide (LASIX) 20 MG tablet Take 20-40 mg by mouth See admin instructions. Take 2 tablets in the morning and take 1 tablet in  the evening   Yes [provider]  gabapentin (NEURONTIN) 600 MG tablet Take 2 tablets (1,200 mg total) by mouth 3 (three) times daily. 05/29/18 08/05/26 Yes Jamse Arn, MD  hydrALAZINE (APRESOLINE) 100 MG tablet Take 1 tablet (100 mg total) by mouth every 8 (eight) hours. 10/21/18  Yes Bonnielee Haff, MD  insulin degludec (TRESIBA FLEXTOUCH) 100 UNIT/ML SOPN FlexTouch Pen Inject 0.05 mLs (5 Units total) into the skin daily. DO NOT TAKE IF CBG'S ARE LESS THAN 150 Patient taking differently: Inject 5-10 Units into the skin daily. DO NOT TAKE IF CBG'S ARE LESS THAN 150 10/23/18  Yes Billie Ruddy, MD  isosorbide dinitrate (ISORDIL) 30 MG tablet Take 30 mg by mouth daily.   Yes [provider]  levofloxacin (LEVAQUIN) 500 MG tablet Take 1 tablet (500 mg total) by mouth daily. 11/26/18  Yes Cardama, Grayce Sessions, MD  linaclotide [facility]) 290 MCG CAPS capsule Take 1 capsule (290 mcg total) by mouth daily before breakfast. Patient taking differently: Take 290 mcg by mouth daily as needed (constipation).  11/07/18  Yes Armbruster, Carlota Raspberry, MD  methocarbamol (ROBAXIN) 500 MG tablet TAKE 1 TABLET BY MOUTH TWICE DAILY AS NEEDED FOR MUSCLE SPASM Patient taking differently: Take 500 mg by mouth 2 (two) times daily as needed for muscle spasms.  08/19/18  Yes Jamse Arn, MD  metoprolol tartrate (LOPRESSOR) 50 MG tablet Take 50 mg by mouth daily.  11/03/18  Yes [provider]  montelukast (SINGULAIR) 10 MG tablet Take 1 tablet (10 mg total) by mouth at bedtime. Patient taking differently: Take 10 mg by mouth daily.  03/06/18  Yes Billie Ruddy, MD  omeprazole (PRILOSEC) 20 MG capsule Take 20 mg by mouth 2 (two) times daily.   Yes [provider]  rosuvastatin (CRESTOR) 20 MG tablet Take 1 tablet (20 mg total) by mouth daily at 6 PM. 11/25/18  Yes Billie Ruddy, MD  ticagrelor (BRILINTA) 90 MG TABS tablet Take 1 tablet (90 mg total) by mouth 2 (two) times daily.  11/25/18  Yes Billie Ruddy, MD  vitamin E (VITAMIN E) 1000 UNIT capsule Take 1,000 Units by mouth daily.    Yes [provider]  Blood Glucose Monitoring Suppl (ACCU-CHEK GUIDE ME) w/Device KIT 1 Device by Does not apply route 4 (four) times daily -  before meals and at bedtime. 11/19/18   Billie Ruddy, MD  Continuous Blood Gluc Sensor (FREESTYLE LIBRE 14 DAY SENSOR) MISC 1 each by Does not apply route daily. 11/06/18   Billie Ruddy, MD  Insulin Pen Needle (BD PEN NEEDLE NANO U/F) 32G X 4 MM MISC USE AS DIRECTED FOR TRESIBA INJECTIONS ONCE DAILY 10/24/18   Billie Ruddy, MD    Physical Exam: Vitals:  12/01/18 1956 12/01/18 2100  BP: 138/69 (!) 165/90  Pulse: 77 78  Resp: 18 14  Temp: 98.1 F (36.7 C)   TempSrc: Oral   SpO2: 99% 99%    Constitutional: NAD, calm, comfortable Eyes: PERRL, lids and conjunctivae normal ENMT: Mucous membranes are moist. Posterior pharynx clear of any exudate or lesions.Normal dentition.  Neck: normal, supple, no masses, no thyromegaly Respiratory: clear to auscultation bilaterally, no wheezing, no crackles. Normal respiratory effort. No accessory muscle use.  Cardiovascular: Regular rate and rhythm, no murmurs / rubs / gallops. No extremity edema. 2+ pedal pulses. No carotid bruits.  Abdomen: no tenderness, no masses palpated. No hepatosplenomegaly. Bowel sounds positive.  Musculoskeletal: no clubbing / cyanosis. No joint deformity upper and lower extremities. Good ROM, no contractures. Normal muscle tone.  Skin: no rashes, lesions, ulcers. No induration Neurologic: CN 2-12 grossly intact. Sensation intact, DTR normal. Strength 5/5 in all 4.  Psychiatric: Normal judgment and insight. Alert and oriented x 3. Normal mood.    Labs on Admission: I have personally reviewed following labs and imaging studies  CBC: Recent Labs  Lab 11/25/18 2232 12/01/18 2052  WBC 9.0 9.7  NEUTROABS  --  8.6*  HGB 8.9* 8.0*  HCT 28.1* 25.5*  MCV  90.6 86.7  PLT 255 101   Basic Metabolic Panel: Recent Labs  Lab 11/25/18 2232 12/01/18 2052  NA 138 137  K 4.8 4.3  CL 104 105  CO2 24 22  GLUCOSE 131* 138*  BUN 25* 28*  CREATININE 2.18* 2.32*  CALCIUM 8.3* 8.2*   GFR: Estimated Creatinine Clearance: 36.4 mL/min (A) (by C-G formula based on SCr of 2.32 mg/dL (H)). Liver Function Tests: No results for input(s): AST, ALT, ALKPHOS, BILITOT, PROT, ALBUMIN in the last 168 hours. No results for input(s): LIPASE, AMYLASE in the last 168 hours. No results for input(s): AMMONIA in the last 168 hours. Coagulation Profile: No results for input(s): INR, PROTIME in the last 168 hours. Cardiac Enzymes: Recent Labs  Lab 12/01/18 2052  TROPONINI 0.03*   BNP (last 3 results) No results for input(s): PROBNP in the last 8760 hours. HbA1C: No results for input(s): HGBA1C in the last 72 hours. CBG: Recent Labs  Lab 11/26/18 0046 11/26/18 0219 11/26/18 0531 12/01/18 2132  GLUCAP 111* 150* 121* 121*   Lipid Profile: No results for input(s): CHOL, HDL, LDLCALC, TRIG, CHOLHDL, LDLDIRECT in the last 72 hours. Thyroid Function Tests: No results for input(s): TSH, T4TOTAL, FREET4, T3FREE, THYROIDAB in the last 72 hours. Anemia Panel: No results for input(s): VITAMINB12, FOLATE, FERRITIN, TIBC, IRON, RETICCTPCT in the last 72 hours. Urine analysis:    Component Value Date/Time   COLORURINE YELLOW 10/29/2018 1646   APPEARANCEUR CLEAR 10/29/2018 1646   LABSPEC 1.010 10/29/2018 1646   PHURINE 7.0 10/29/2018 1646   GLUCOSEU 150 (A) 10/29/2018 1646   HGBUR NEGATIVE 10/29/2018 1646   BILIRUBINUR NEGATIVE 10/29/2018 1646   KETONESUR NEGATIVE 10/29/2018 1646   PROTEINUR >=300 (A) 10/29/2018 1646   NITRITE NEGATIVE 10/29/2018 1646   LEUKOCYTESUR NEGATIVE 10/29/2018 1646    Radiological Exams on Admission: Dg Chest 2 View  Result Date: 12/01/2018 CLINICAL DATA:  Heartburn, weakness, multiple falls, and nausea for 1 day. Former smoker.  History of diabetes and hypertension. EXAM: CHEST - 2 VIEW COMPARISON:  11/25/2018 FINDINGS: Shallow inspiration. Cardiac enlargement. Chronic blunting of left costophrenic angle suggesting pleural thickening. Infiltration previously seen in the right lung has mostly resolved. No developing consolidation. No pneumothorax. Bronchiectasis demonstrated in  the right mid lung. Soft tissue metallic density posterior to the thoracic spine. Degenerative changes in the spine. IMPRESSION: Cardiac enlargement. Chronic blunting of left costophrenic angle. No evidence of active pulmonary disease. Electronically Signed   By: Lucienne Capers M.D.   On: 12/01/2018 21:34    EKG: Independently reviewed.  Assessment/Plan Principal Problem:   Syncope Active Problems:   Essential hypertension   Normocytic normochromic anemia   CKD (chronic kidney disease) stage 3, GFR 30-59 ml/min (HCC)   Hyperlipidemia   Diabetes mellitus with renal complications (HCC)   CAD (coronary artery disease)    1. Syncope - 1. Due to CAD vs GIB vs other. 2. Concerned he may have melena, though he denies diarrhea. 3. Will check hemoccult, repeat CBC in AM 4. Continuing ASA / Brilinta for the moment given recent stent, also doubt that this represents a very fast GIB given formed stools. 1. None the less, will increase PPI to 15m BID for the moment given this and given "heart burn" symptoms. 5. If hemoccult positive needs GI consult in AM 6. NPO after MN 7. Serial trops 8. Tele monitor 9. Hold lasix 10. Cards to see in AM 2. HTN - Continue home BP meds 3. DM2 - 1. Hold home degludec 2. Sensitive SSI Q4H 4. CKD stage 3 - 1. Creat at baseline 2. Repeat BMP in AM 5. HLD - continue statin  DVT prophylaxis: SCDs Code Status: Full Family Communication: No family in room Disposition Plan: Home after admit Consults called: Cardiology [place] [facility] [facility] Pager  [phone] works nights!  If 7AM-7PM, please contact the primary day team physician taking care of patient  [email or web address] Password [email or web address] 12/01/2018, 10:57 PM

## 2018-12-02 ENCOUNTER — Other Ambulatory Visit: Payer: Self-pay

## 2018-12-02 ENCOUNTER — Encounter (HOSPITAL_COMMUNITY): Payer: Self-pay

## 2018-12-02 ENCOUNTER — Inpatient Hospital Stay: Payer: BLUE CROSS/BLUE SHIELD | Admitting: Family Medicine

## 2018-12-02 ENCOUNTER — Telehealth: Payer: Self-pay

## 2018-12-02 ENCOUNTER — Encounter: Payer: Medicare HMO | Admitting: Psychology

## 2018-12-02 DIAGNOSIS — E1122 Type 2 diabetes mellitus with diabetic chronic kidney disease: Secondary | ICD-10-CM | POA: Diagnosis not present

## 2018-12-02 DIAGNOSIS — R1310 Dysphagia, unspecified: Secondary | ICD-10-CM

## 2018-12-02 DIAGNOSIS — E43 Unspecified severe protein-calorie malnutrition: Secondary | ICD-10-CM

## 2018-12-02 DIAGNOSIS — D649 Anemia, unspecified: Secondary | ICD-10-CM | POA: Diagnosis not present

## 2018-12-02 DIAGNOSIS — I10 Essential (primary) hypertension: Secondary | ICD-10-CM | POA: Diagnosis not present

## 2018-12-02 DIAGNOSIS — R55 Syncope and collapse: Secondary | ICD-10-CM | POA: Diagnosis not present

## 2018-12-02 DIAGNOSIS — E440 Moderate protein-calorie malnutrition: Secondary | ICD-10-CM

## 2018-12-02 DIAGNOSIS — K219 Gastro-esophageal reflux disease without esophagitis: Secondary | ICD-10-CM

## 2018-12-02 LAB — TROPONIN I
Troponin I: 0.03 ng/mL (ref <0.03)
Troponin I: 0.03 ng/mL (ref <0.03)
Troponin I: 0.03 ng/mL (ref <0.03)

## 2018-12-02 LAB — CBC
HCT: 23.3 % — ABNORMAL LOW (ref 39.0–52.0)
Hemoglobin: 7.4 g/dL — ABNORMAL LOW (ref 13.0–17.0)
MCH: 27.3 pg (ref 26.0–34.0)
MCHC: 31.8 g/dL (ref 30.0–36.0)
MCV: 86 fL (ref 80.0–100.0)
Platelets: 234 10*3/uL (ref 150–400)
RBC: 2.71 MIL/uL — ABNORMAL LOW (ref 4.22–5.81)
RDW: 13.3 % (ref 11.5–15.5)
WBC: 6.1 10*3/uL (ref 4.0–10.5)
nRBC: 0 % (ref 0.0–0.2)

## 2018-12-02 LAB — BASIC METABOLIC PANEL
Anion gap: 8 (ref 5–15)
BUN: 27 mg/dL — ABNORMAL HIGH (ref 8–23)
CO2: 22 mmol/L (ref 22–32)
Calcium: 8 mg/dL — ABNORMAL LOW (ref 8.9–10.3)
Chloride: 108 mmol/L (ref 98–111)
Creatinine, Ser: 2.33 mg/dL — ABNORMAL HIGH (ref 0.61–1.24)
GFR calc Af Amer: 32 mL/min — ABNORMAL LOW (ref >60)
GFR calc non Af Amer: 28 mL/min — ABNORMAL LOW (ref >60)
Glucose, Bld: 108 mg/dL — ABNORMAL HIGH (ref 70–99)
Potassium: 3.9 mmol/L (ref 3.5–5.1)
Sodium: 138 mmol/L (ref 135–145)

## 2018-12-02 LAB — GLUCOSE, CAPILLARY
Glucose-Capillary: 176 mg/dL — ABNORMAL HIGH (ref 70–99)
Glucose-Capillary: 99 mg/dL (ref 70–99)
Glucose-Capillary: 105 mg/dL — ABNORMAL HIGH (ref 70–99)
Glucose-Capillary: 161 mg/dL — ABNORMAL HIGH (ref 70–99)
Glucose-Capillary: 106 mg/dL — ABNORMAL HIGH (ref 70–99)

## 2018-12-02 LAB — IRON AND TIBC
Iron: 35 ug/dL — ABNORMAL LOW (ref 45–182)
Saturation Ratios: 19 % (ref 17.9–39.5)
TIBC: 185 ug/dL — ABNORMAL LOW (ref 250–450)
UIBC: 150 ug/dL

## 2018-12-02 LAB — HEMOGLOBIN AND HEMATOCRIT, BLOOD
HCT: 23.9 % — ABNORMAL LOW (ref 39.0–52.0)
Hemoglobin: 7.6 g/dL — ABNORMAL LOW (ref 13.0–17.0)

## 2018-12-02 LAB — CBG MONITORING, ED: Glucose-Capillary: 197 mg/dL — ABNORMAL HIGH (ref 70–99)

## 2018-12-02 LAB — ABO/RH: ABO/RH(D): A POS

## 2018-12-02 LAB — FERRITIN: Ferritin: 212 ng/mL (ref 24–336)

## 2018-12-02 MED ORDER — INFLUENZA VAC SPLIT HIGH-DOSE 0.5 ML IM SUSY
0.5000 mL | PREFILLED_SYRINGE | INTRAMUSCULAR | Status: AC
  Administered 2018-12-04: 0.5 mL via INTRAMUSCULAR
  Filled 2018-12-02: qty 0.5

## 2018-12-02 MED ORDER — ISOSORBIDE MONONITRATE ER 30 MG PO TB24
30.0000 mg | ORAL_TABLET | Freq: Every day | ORAL | Status: DC
  Administered 2018-12-02 – 2018-12-04 (×3): 30 mg via ORAL
  Filled 2018-12-02 (×3): qty 1

## 2018-12-02 MED ORDER — PANTOPRAZOLE SODIUM 40 MG IV SOLR
40.0000 mg | INTRAVENOUS | Status: DC
  Administered 2018-12-02: 40 mg via INTRAVENOUS
  Filled 2018-12-02: qty 40

## 2018-12-02 MED ORDER — BOOST / RESOURCE BREEZE PO LIQD CUSTOM
1.0000 | Freq: Three times a day (TID) | ORAL | Status: DC
  Administered 2018-12-02 (×2): 1 via ORAL

## 2018-12-02 MED ORDER — SODIUM CHLORIDE 0.9 % IV SOLN
8.0000 mg/h | INTRAVENOUS | Status: DC
  Administered 2018-12-02: 8 mg/h via INTRAVENOUS
  Filled 2018-12-02 (×2): qty 80

## 2018-12-02 MED ORDER — PANTOPRAZOLE SODIUM 40 MG PO TBEC
40.0000 mg | DELAYED_RELEASE_TABLET | Freq: Every day | ORAL | Status: DC
  Administered 2018-12-03 – 2018-12-04 (×2): 40 mg via ORAL
  Filled 2018-12-02 (×2): qty 1

## 2018-12-02 MED ORDER — SODIUM CHLORIDE 0.9 % IV SOLN
80.0000 mg | Freq: Once | INTRAVENOUS | Status: AC
  Administered 2018-12-02: 80 mg via INTRAVENOUS
  Filled 2018-12-02: qty 80

## 2018-12-02 MED ORDER — PANTOPRAZOLE SODIUM 40 MG IV SOLR: 40.0000 mg | Freq: Two times a day (BID) | INTRAVENOUS | Status: DC

## 2018-12-02 NOTE — Progress Notes (Signed)
Initial Nutrition Assessment  DOCUMENTATION CODES:   Non-severe (moderate) malnutrition in context of chronic illness  INTERVENTION:   - Boost Breeze po TID, each supplement provides 250 kcal and 9 grams of protein  - Will monitor for diet advancement and supplement as appropriate  NUTRITION DIAGNOSIS:   Moderate Malnutrition related to chronic illness (CKD stage III, CAD s/p stent for NSTEMII) as evidenced by mild fat depletion, moderate fat depletion, mild muscle depletion, percent weight loss (12.1% weight loss in 1 month).  GOAL:   Patient will meet greater than or equal to 90% of their needs  MONITOR:   PO intake, Supplement acceptance, Diet advancement, Weight trends, Labs  REASON FOR ASSESSMENT:   Malnutrition Screening Tool    ASSESSMENT:   68 year old male who presented to the ED on 2/3 after multiple syncopal events. PMH significant for T2DM, CAD s/p stent for NSTEMI in December 2019, HTN, HLD, CKD stage 3. Pt seen in the ED on 1/29 and diagnosed with PNA at that time.  Spoke with pt at bedside who reports that his stomach feels "empty" and that all he has had to eat/drink so far was chicken broth and ginger ale. Pt endorses a good appetite at present stating, "I'm hungry." RD explained parameters of clear liquid diet and offered oral nutrition supplement. Pt amenable to trying Boost Breeze while on clear liquid diet. RD to follow for diet advancement and change supplement order as appropriate.  Pt reports that his daughter came to visit 2 months ago and has "cleaned out my fridge and gotten rid of all the foods I love" including liver pudding, butter, etc. Pt states that his daughter took notes during previous meetings with MD about how to make diet changes "for my heart." Pt specifically lists cutting out salt and eating more sugar-free desserts.  Pt shares that he typically has a good appetite and eats 5 small meals daily "because I'm anemic." A meal may include  hamburger steak with mashed potatoes and green beans. Pt also lists chicken, squash, cabbage, and rice as frequently consumed foods.  Pt states that yesterday, he lost his appetite and only ate 2/3 of a bowl of soup that his daughter prepared for him. Pt states that normally he eats much more.  Pt reports having DM diagnosis since 1990 and that he checks his blood sugar anywhere from 5-10 times daily. Pt reports that when he typically checks his sugar, it is between 130-135. Pt reports that when his sugar drops below 100, "it crashes real quick."  Pt endorses recent weight loss but is unsure why he is losing weight. Per weight history in chart, pt with 12.4 kg progressive weight loss over the last 1 month. This is a 12.1% weight loss which is significant for timeframe.  Medications reviewed and include: vitamin D-3, SSI q 4 hours, Protonix, vitamin B-12, vitamin E  Labs reviewed: BUN 27 (H), creatinine 2.33 (H), hemoglobin 7.4 (L) CBG's: 99, 176, 197, 121  NUTRITION - FOCUSED PHYSICAL EXAM:    Most Recent Value  Orbital Region  No depletion  Upper Arm Region  Moderate depletion  Thoracic and Lumbar Region  Mild depletion  Buccal Region  No depletion  Temple Region  No depletion  Clavicle Bone Region  No depletion  Clavicle and Acromion Bone Region  Mild depletion  Scapular Bone Region  No depletion  Dorsal Hand  No depletion  Patellar Region  No depletion  Anterior Thigh Region  Mild depletion  Posterior Calf Region  No depletion  Edema (RD Assessment)  None  Hair  Reviewed  Eyes  Reviewed  Mouth  Reviewed  Skin  Reviewed  Nails  Reviewed       Diet Order:   Diet Order            Diet clear liquid Room service appropriate? Yes; Fluid consistency: Thin  Diet effective now              EDUCATION NEEDS:   No education needs have been identified at this time  Skin:  Skin Assessment: Reviewed RN Assessment  Last BM:  2/3  Height:   Ht Readings from Last 1  Encounters:  12/02/18 6\' 3"  (1.905 m)    Weight:   Wt Readings from Last 1 Encounters:  12/02/18 89.7 kg    Ideal Body Weight:  89.1 kg  BMI:  Body mass index is 24.72 kg/m.  Estimated Nutritional Needs:   Kcal:  2200-2400  Protein:  105-120 grams  Fluid:  >/= 2.2 L    Gaynell Face, MS, RD, LDN Inpatient Clinical Dietitian Pager: [phone] Weekend/After Hours: [phone]

## 2018-12-02 NOTE — Consult Note (Signed)
Reason for Consult: Troponin elevation Referring Physician: [facility]  [facility] is an 68 y.o. male.  HPI:   68 y/o [place] American male with uncontrolled hypertension, type 2 DM, hyperlipidemia, CKD stage III/V, CAD s/p prior PCI, NSTEMI in 09/2018, attempted but unsuccessful PTCA to chronically occluded Rt PDA.  [facility] on 12/01/2018 after an episode of near syncope. Collateral history not available at this time. It appears that patient had been doing well after an episode of productive cough earlier in 10/2018-which was treated with antibiotics. He reported episodes of lightheadedness, followed by near syncope after walking out of the bathroom. Following this, he felt retrosternal burning sensation, but no specific chest pain, shortness of breath symptoms. Retrosternal burning was similar to his episode of NSTEMI in 09/2018.   Workup so far has showed unchanged EKG without evidence of acute MI, mildly elevated but flat troponin X3 at 0.03. Significantly, his hemoglobin has serially dropped from 10-11 to 7.4 today.  Past Medical History:  Diagnosis Date  . Anemia   . CKD (chronic kidney disease) stage 3, GFR 30-59 ml/min (HCC)   . Coronary artery disease   . Diabetic peripheral neuropathy ([place])   . GERD (gastroesophageal reflux disease)   . Hyperlipidemia   . Hypertension   . Hypertensive crisis 10/16/2018  . Noncompliance with medication regimen   . Osteoarthritis    "legs, back" (10/16/2018)  . Pneumonia 2016/02/02   "real bad; I died and they had to bring me back" (10/16/2018)  . Seasonal allergies   . Type II diabetes mellitus ([place])     Past Surgical History:  Procedure Laterality Date  . CARDIAC CATHETERIZATION  10/20/2018  . CORONARY BALLOON ANGIOPLASTY N/A 10/20/2018   Procedure: CORONARY BALLOON ANGIOPLASTY;  Surgeon: Nigel Mormon, MD;  Location: [facility];  Service: Cardiovascular;  Laterality: N/A;  . JOINT  REPLACEMENT    . LEFT HEART CATH AND CORONARY ANGIOGRAPHY N/A 10/20/2018   Procedure: LEFT HEART CATH AND CORONARY ANGIOGRAPHY;  Surgeon: Nigel Mormon, MD;  Location: [facility];  Service: Cardiovascular;  Laterality: N/A;  . TOTAL KNEE ARTHROPLASTY Right     Family History  Problem Relation Age of Onset  . Hypertension Mother   . Diabetes Mother   . Hyperlipidemia Mother   . Hypertension Father   . Hypertension Sister   . Cancer Sister     Social History:  reports that he quit smoking about 35 years ago. His smoking use included cigarettes. He has a 6.60 pack-year smoking history. He has never used smokeless tobacco. He reports current alcohol use. He reports that he does not use drugs.  Allergies: No Known Allergies  Medications: I have reviewed the patient's current medications.  Results for orders placed or performed during the hospital encounter of 12/01/18 (from the past 48 hour(s))  Basic metabolic panel     Status: Abnormal   Collection Time: 12/01/18  8:52 PM  Result Value Ref Range   Sodium 137 135 - 145 mmol/L   Potassium 4.3 3.5 - 5.1 mmol/L   Chloride 105 98 - 111 mmol/L   CO2 22 22 - 32 mmol/L   Glucose, Bld 138 (H) 70 - 99 mg/dL   BUN 28 (H) 8 - 23 mg/dL   Creatinine, Ser 2.32 (H) 0.61 - 1.24 mg/dL   Calcium 8.2 (L) 8.9 - 10.3 mg/dL   GFR calc non Af Amer 28 (L) >60 mL/min   GFR  calc Af Amer 32 (L) >60 mL/min   Anion gap 10 5 - 15    Comment: Performed at [facility] [address] [place], [place] [address]  Troponin I - Once     Status: Abnormal   Collection Time: 12/01/18  8:52 PM  Result Value Ref Range   Troponin I 0.03 (HH) <0.03 ng/mL    Comment: CRITICAL RESULT CALLED TO, READ BACK BY AND VERIFIED WITH:  Roddie Mc 12/01/18 2148 WAYK Performed at [facility], [place] [address] [place], [place] [address]   CBC with Differential     Status: Abnormal   Collection Time: 12/01/18  8:52 PM  Result Value Ref Range   WBC  9.7 4.0 - 10.5 K/uL   RBC 2.94 (L) 4.22 - 5.81 MIL/uL   Hemoglobin 8.0 (L) 13.0 - 17.0 g/dL   HCT 25.5 (L) 39.0 - 52.0 %   MCV 86.7 80.0 - 100.0 fL   MCH 27.2 26.0 - 34.0 pg   MCHC 31.4 30.0 - 36.0 g/dL   RDW 13.4 11.5 - 15.5 %   Platelets 275 150 - 400 K/uL   nRBC 0.0 0.0 - 0.2 %   Neutrophils Relative % 89 %   Neutro Abs 8.6 (H) 1.7 - 7.7 K/uL   Lymphocytes Relative 6 %   Lymphs Abs 0.5 (L) 0.7 - 4.0 K/uL   Monocytes Relative 4 %   Monocytes Absolute 0.4 0.1 - 1.0 K/uL   Eosinophils Relative 1 %   Eosinophils Absolute 0.1 0.0 - 0.5 K/uL   Basophils Relative 0 %   Basophils Absolute 0.0 0.0 - 0.1 K/uL   Immature Granulocytes 0 %   Abs Immature Granulocytes 0.04 0.00 - 0.07 K/uL    Comment: Performed at [facility], [place] [address] [place], [place] [address]  CBG monitoring, ED     Status: Abnormal   Collection Time: 12/01/18  9:32 PM  Result Value Ref Range   Glucose-Capillary 121 (H) 70 - 99 mg/dL  Troponin I - Now Then Q6H     Status: Abnormal   Collection Time: 12/01/18 10:58 PM  Result Value Ref Range   Troponin I 0.03 (HH) <0.03 ng/mL    Comment: CRITICAL VALUE NOTED.  VALUE IS CONSISTENT WITH PREVIOUSLY REPORTED AND CALLED VALUE. Performed at [facility], [place] [address] [place], [place] [address]   CBG monitoring, ED     Status: Abnormal   Collection Time: 12/02/18 12:37 AM  Result Value Ref Range   Glucose-Capillary 197 (H) 70 - 99 mg/dL   Comment 1 Notify RN    Comment 2 Document in Chart   Glucose, capillary     Status: Abnormal   Collection Time: 12/02/18  2:51 AM  Result Value Ref Range   Glucose-Capillary 176 (H) 70 - 99 mg/dL   Comment 1 Notify RN    Comment 2 Document in Chart   CBC     Status: Abnormal   Collection Time: 12/02/18  6:12 AM  Result Value Ref Range   WBC 6.1 4.0 - 10.5 K/uL   RBC 2.71 (L) 4.22 - 5.81 MIL/uL   Hemoglobin 7.4 (L) 13.0 - 17.0 g/dL   HCT 23.3 (L) 39.0 - 52.0 %   MCV 86.0 80.0 - 100.0 fL   MCH 27.3 26.0 -  34.0 pg   MCHC 31.8 30.0 - 36.0 g/dL   RDW 13.3 11.5 - 15.5 %   Platelets 234 150 - 400  K/uL   nRBC 0.0 0.0 - 0.2 %    Comment: Performed at [facility], [place] [address] [place], [place] [address]  Basic metabolic panel     Status: Abnormal   Collection Time: 12/02/18  6:12 AM  Result Value Ref Range   Sodium 138 135 - 145 mmol/L   Potassium 3.9 3.5 - 5.1 mmol/L   Chloride 108 98 - 111 mmol/L   CO2 22 22 - 32 mmol/L   Glucose, Bld 108 (H) 70 - 99 mg/dL   BUN 27 (H) 8 - 23 mg/dL   Creatinine, Ser 2.33 (H) 0.61 - 1.24 mg/dL   Calcium 8.0 (L) 8.9 - 10.3 mg/dL   GFR calc non Af Amer 28 (L) >60 mL/min   GFR calc Af Amer 32 (L) >60 mL/min   Anion gap 8 5 - 15    Comment: Performed at [facility] [address] [place], [place] [address]  Troponin I - Now Then Q6H     Status: Abnormal   Collection Time: 12/02/18  6:12 AM  Result Value Ref Range   Troponin I 0.03 (HH) <0.03 ng/mL    Comment: CRITICAL VALUE NOTED.  VALUE IS CONSISTENT WITH PREVIOUSLY REPORTED AND CALLED VALUE. Performed at [facility], [place] [address], [place], [place] [address]   Glucose, capillary     Status: None   Collection Time: 12/02/18  7:45 AM  Result Value Ref Range   Glucose-Capillary 99 70 - 99 mg/dL  Troponin I - Now Then Q6H     Status: Abnormal   Collection Time: 12/02/18 10:50 AM  Result Value Ref Range   Troponin I 0.03 (HH) <0.03 ng/mL    Comment: CRITICAL VALUE NOTED.  VALUE IS CONSISTENT WITH PREVIOUSLY REPORTED AND CALLED VALUE. Performed at [facility], [place] [address] [place], [place] [address]   Iron and TIBC     Status: Abnormal   Collection Time: 12/02/18 10:50 AM  Result Value Ref Range   Iron 35 (L) 45 - 182 ug/dL   TIBC 185 (L) 250 - 450 ug/dL   Saturation Ratios 19 17.9 - 39.5 %   UIBC 150 ug/dL    Comment: Performed at [facility], [place] [address], [place], [place] [address]  Ferritin     Status: None   Collection Time: 12/02/18 10:50 AM  Result  Value Ref Range   Ferritin 212 24 - 336 ng/mL    Comment: Performed at [facility], [place] [address] [place] [address], [place], [place] [address]  Hemoglobin and hematocrit, blood     Status: Abnormal   Collection Time: 12/02/18 10:50 AM  Result Value Ref Range   Hemoglobin 7.6 (L) 13.0 - 17.0 g/dL   HCT 23.9 (L) 39.0 - 52.0 %    Comment: Performed at [facility], [place] [address], [place], [place] [address]  Type and screen [place]     Status: None   Collection Time: 12/02/18 10:55 AM  Result Value Ref Range   ABO/RH(D) A POS    Antibody Screen NEG    Sample Expiration      12/05/2018 Performed at [facility], [place] [address] [place], [place] [address]   Glucose, capillary     Status: Abnormal   Collection Time: 12/02/18 11:54 AM  Result Value Ref Range   Glucose-Capillary 105 (H) 70 - 99 mg/dL    Dg Chest 2 View  Result  Date: 12/01/2018 CLINICAL DATA:  Heartburn, weakness, multiple falls, and nausea for 1 day. Former smoker. History of diabetes and hypertension. EXAM: CHEST - 2 VIEW COMPARISON:  11/25/2018 FINDINGS: Shallow inspiration. Cardiac enlargement. Chronic blunting of left costophrenic angle suggesting pleural thickening. Infiltration previously seen in the right lung has mostly resolved. No developing consolidation. No pneumothorax. Bronchiectasis demonstrated in the right mid lung. Soft tissue metallic density posterior to the thoracic spine. Degenerative changes in the spine. IMPRESSION: Cardiac enlargement. Chronic blunting of left costophrenic angle. No evidence of active pulmonary disease. Electronically Signed   By: Lucienne Capers M.D.   On: 12/01/2018 21:34    Cardiac studies: EKG 12/01/2018: Sinus rhythm 80 bpm. ST-T changes likely related to LVH   Cath 10/20/2018: LM: Normal LAD: Distal 70% diffuse disease.        Ostial 90% stenosis in Diag1. Patent prox Diag 1 stent LCx: Large OM1 with patent prior stent RCA: 100% occluded RPDA         Partially successful PTCA attempt        100%--99% stenosis        TIMI flow 0-I  LVEDP 16 mmHg  Echocardiogram 10/17/2018: - Left ventricle: The cavity size was normal. Wall thickness was   increased in a pattern of severe LVH. Systolic function was   normal. The estimated ejection fraction was in the range of 50%   to 55%. Wall motion was normal; there were no regional wall   motion abnormalities. Doppler parameters are consistent with   abnormal left ventricular relaxation (grade 1 diastolic   dysfunction). Doppler parameters are consistent with high   ventricular filling pressure. - Left atrium: The atrium was moderately dilated. - Right atrium: The atrium was mildly dilated.  Impressions:  - Low normal to mildly reduced LV systolic function; EF 50; severe   LVH; mild diastolic dysfunction; biatrial enlargement.    Review of Systems  Constitutional: Negative.   HENT: Negative.   Eyes: Negative.   Respiratory: Negative for shortness of breath.   Cardiovascular: Negative for chest pain and claudication.  Gastrointestinal: Positive for heartburn and melena.  Genitourinary: Negative.   Musculoskeletal: Negative.   Skin: Negative.   Neurological: Positive for dizziness and loss of consciousness (Brief).  Endo/Heme/Allergies: Does not bruise/bleed easily.  Psychiatric/Behavioral: Negative.    Blood pressure (!) 152/81, pulse 71, temperature 98.4 F (36.9 C), temperature source Oral, resp. rate 16, height 6\' 3"  (1.905 m), weight 89.7 kg, SpO2 100 %. Physical Exam  Nursing note and vitals reviewed. Constitutional: He is oriented to person, place, and time. He appears well-developed and well-nourished. No distress.  HENT:  Head: Normocephalic and atraumatic.  Eyes: Pupils are equal, round, and reactive to light. Conjunctivae are normal.  Neck: Normal range of motion. Neck supple. No JVD present.  Cardiovascular: Normal rate and regular rhythm.  Murmur (II/VI  crescendo murmur at RUSB) heard. Pulses:      Dorsalis pedis pulses are 0 on the right side and 0 on the left side.       Posterior tibial pulses are 1+ on the right side and 1+ on the left side.  Respiratory: Effort normal and breath sounds normal. He has no wheezes. He has no rales.  GI: Soft. Bowel sounds are normal. There is no abdominal tenderness.  Musculoskeletal:        General: No edema.  Lymphadenopathy:    He has no cervical adenopathy.  Neurological: He is alert and oriented to person, place, and time.  Psychiatric: He has a normal mood and affect.   Assessment/Recommendations:  68 y/o [place] American male with uncontrolled hypertension, type 2 DM, hyperlipidemia, CKD stage III/V, CAD s/p prior PCI, NSTEMI in 09/2018, attempted but unsuccessful PTCA to chronically occluded Rt PDA, now admitted with near syncope.   Near syncope: Likely neurocardiogenic. Suspect anemia may be contributing. Agree with workup and management for anemia. Okay to stop Brilinta pending further workup, as patient does not have any recent coronary stents.   CAD: S/p NSTEMI 2019, prior stents. Continue aspirin, if possible. Okay to hold Brilinta in view of possible blood loss anemia.  Patient is not a candidate for revascualrization given anemia and CKD  Troponin elevation: Type 2 MI in setting of elevated . Not ACS  Hypertension: Management as you are doing,   Manish J Patwardhan 12/02/2018, 2:03 PM   Manish Esther Hardy, MD [facility] [place] Pager: [phone] Office: [phone] If no answer Cell [phone]

## 2018-12-02 NOTE — Consult Note (Addendum)
[facility] Consult: 12:17 PM 12/02/2018  LOS: 0 days    Referring Provider: Dr. Eliseo Squires Primary Care Physician:  Billie Ruddy, MD Primary Gastroenterologist:  Dr. Havery Moros   Reason for Consultation:.  Recent melenic stools.   HPI: William Webb is a 68 y.o. male.  PMH as below.  CAD, previous cardiac stenting.  NSTEMI, cardiac cath 10/20/18 with perfuse CAD patent stents, partially successful PTCA to RCA.  Started on Brilinta and low-dose aspirin then.  CKD 4.    Seen by Dr. Havery Moros for the first time as office visit for solid food dysphagia on 11/07/2018, sense of food sticking occurs at the upper esophagus, water brash.  Had recently been started on omeprazole 20 mg daily.  Requested records from procedures performed in [place].  Was scheduled for barium esophagram.  05/2012 EGD in [place] revealed normal esophagus, mild gastric erythema, normal duodenum. Patient has issues with chronic constipation, sometimes it has a bowel movement every 2 weeks.  Passes small, hard stool.  05/2012 Colonoscopy colonoscopy normal other than internal hemorrhoids.. Because of the recent addition of Brilinta, Dr. Havery Moros did not plan EGD or colonoscopy.   He suggested use of high-dose MiraLAX or Linzess and was provided samples of Linzess 290 mcg daily.  Also advised increasing omeprazole to 20 mg twice daily.  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  Moderate esophageal dysmotility with a pattern characteristic of chronic reflux related dysmotility.  Minimal eccentric puckering at the lower thoracic esophagus near the EG junction but no esophageal stricture.  Small, plaque-like, filling defects  throughout the mid to upper thoracic esophagus compatible with glycogenic acanthosis.    Patient declined admission for pneumonia on 11/25/2018 and sent home on Levaquin, cough has subsequently improved.  Patient presented to the ED yesterday, 2/3, with complaints of 2 to 3 days of feeling very fatigued, dizzy/lightheaded.  Some indigestion type burning central sternal pain.  No dyspnea, no diaphoresis.  Near syncopal event and falls at home.   Hgb 8 >> 7.4 grams overnight.  Range of Hgb: 12.1 - 9.3 grams in mid to late December 2019, but seems to hang around 9-10 grams as his true baseline.  8.9 on 11/25/17.  MCV 86.  Ferritin 212, iron 35, iron saturation 19, TIBC low at 185.  Hemoccult pending.  He reports that he really does not look at his BM's well but has noticed on occasion that they have been "dark" at times.  Usually formed stools.  No obvious red blood per rectum.  Has chronic constipation so has been using Linzess that was prescribed by Dr. Havery Moros, but says that sometimes he just takes a half dose, etc.  Stable stage IV CKD CXR with resolution of PNA  Received Brilinta early this AM and is now on hold.    Past Medical History:  Diagnosis Date  . Anemia   . CKD (chronic kidney disease) stage 3, GFR 30-59 ml/min (HCC)   . Coronary artery disease   .  Diabetic peripheral neuropathy ([place])   . GERD (gastroesophageal reflux disease)   . Hyperlipidemia   . Hypertension   . Hypertensive crisis 10/16/2018  . Noncompliance with medication regimen   . Osteoarthritis    "legs, back" (10/16/2018)  . Pneumonia 12/16/15   "real bad; I died and they had to bring me back" (10/16/2018)  . Seasonal allergies   . Type II diabetes mellitus ([place])     Past Surgical History:  Procedure Laterality Date  . CARDIAC CATHETERIZATION  10/20/2018  . CORONARY BALLOON ANGIOPLASTY N/A 10/20/2018   Procedure: CORONARY BALLOON ANGIOPLASTY;  Surgeon: Nigel Mormon, MD;  Location: [facility];   Service: Cardiovascular;  Laterality: N/A;  . JOINT REPLACEMENT    . LEFT HEART CATH AND CORONARY ANGIOGRAPHY N/A 10/20/2018   Procedure: LEFT HEART CATH AND CORONARY ANGIOGRAPHY;  Surgeon: Nigel Mormon, MD;  Location: [facility];  Service: Cardiovascular;  Laterality: N/A;  . TOTAL KNEE ARTHROPLASTY Right     Prior to Admission medications   Medication Sig Start Date End Date Taking? Authorizing Provider  acetaminophen (TYLENOL) 500 MG tablet Take 1,000 mg by mouth 2 (two) times daily.    Yes [provider]  amLODipine (NORVASC) 10 MG tablet Take 1 tablet (10 mg total) by mouth daily. 10/21/18  Yes Bonnielee Haff, MD  aspirin EC 81 MG tablet Take 1 tablet (81 mg total) by mouth daily. 03/06/18  Yes Billie Ruddy, MD  cholecalciferol (VITAMIN D) 25 MCG (1000 UT) tablet Take 1,000 Units by mouth daily.    Yes [provider]  Cyanocobalamin 2500 MCG CHEW Chew 2,500 mcg by mouth daily.   Yes [provider]  furosemide (LASIX) 20 MG tablet Take 20-40 mg by mouth See admin instructions. Take 2 tablets in the morning and take 1 tablet in the evening   Yes [provider]  gabapentin (NEURONTIN) 600 MG tablet Take 2 tablets (1,200 mg total) by mouth 3 (three) times daily. 05/29/18 08/05/26 Yes Jamse Arn, MD  hydrALAZINE (APRESOLINE) 100 MG tablet Take 1 tablet (100 mg total) by mouth every 8 (eight) hours. 10/21/18  Yes Bonnielee Haff, MD  insulin degludec (TRESIBA FLEXTOUCH) 100 UNIT/ML SOPN FlexTouch Pen Inject 0.05 mLs (5 Units total) into the skin daily. DO NOT TAKE IF CBG'S ARE LESS THAN 150 Patient taking differently: Inject 5-10 Units into the skin daily. DO NOT TAKE IF CBG'S ARE LESS THAN 150 10/23/18  Yes Billie Ruddy, MD  isosorbide mononitrate (IMDUR) 30 MG 24 hr tablet Take 30 mg by mouth daily.   Yes [provider]  levofloxacin (LEVAQUIN) 500 MG tablet Take 1 tablet (500 mg total) by mouth daily. 11/26/18  Yes  Cardama, Grayce Sessions, MD  linaclotide [facility]) 290 MCG CAPS capsule Take 1 capsule (290 mcg total) by mouth daily before breakfast. Patient taking differently: Take 290 mcg by mouth daily as needed (constipation).  11/07/18  Yes Armbruster, Carlota Raspberry, MD  methocarbamol (ROBAXIN) 500 MG tablet TAKE 1 TABLET BY MOUTH TWICE DAILY AS NEEDED FOR MUSCLE SPASM Patient taking differently: Take 500 mg by mouth 2 (two) times daily as needed for muscle spasms.  08/19/18  Yes Jamse Arn, MD  metoprolol tartrate (LOPRESSOR) 50 MG tablet Take 50 mg by mouth daily.  11/03/18  Yes [provider]  montelukast (SINGULAIR) 10 MG tablet Take 1 tablet (10 mg total) by mouth at bedtime. Patient taking differently: Take 10 mg by mouth daily.  03/06/18  Yes Billie Ruddy, MD  omeprazole (PRILOSEC) 20 MG capsule Take 20 mg by mouth 2 (two) times daily.   Yes [provider]  rosuvastatin (CRESTOR) 20 MG tablet Take 1 tablet (20 mg total) by mouth daily at 6 PM. 11/25/18  Yes Billie Ruddy, MD  ticagrelor (BRILINTA) 90 MG TABS tablet Take 1 tablet (90 mg total) by mouth 2 (two) times daily. 11/25/18  Yes Billie Ruddy, MD  vitamin E (VITAMIN E) 1000 UNIT capsule Take 1,000 Units by mouth daily.    Yes [provider]  Blood Glucose Monitoring Suppl (ACCU-CHEK GUIDE ME) w/Device KIT 1 Device by Does not apply route 4 (four) times daily -  before meals and at bedtime. 11/19/18   Billie Ruddy, MD  Continuous Blood Gluc Sensor (FREESTYLE LIBRE 14 DAY SENSOR) MISC 1 each by Does not apply route daily. 11/06/18   Billie Ruddy, MD  Insulin Pen Needle (BD PEN NEEDLE NANO U/F) 32G X 4 MM MISC USE AS DIRECTED FOR TRESIBA INJECTIONS ONCE DAILY 10/24/18   Billie Ruddy, MD    Scheduled Meds: . acetaminophen  1,000 mg Oral BID  . amLODipine  10 mg Oral Daily  . cholecalciferol  1,000 Units Oral Daily  . feeding supplement  1 Container Oral TID BM  . gabapentin  1,200 mg Oral TID  .  hydrALAZINE  100 mg Oral Q8H  . [START ON 12/03/2018] Influenza vac split quadrivalent PF  0.5 mL Intramuscular Tomorrow-1000  . insulin aspart  0-9 Units Subcutaneous Q4H  . isosorbide mononitrate  30 mg Oral Daily  . metoprolol tartrate  50 mg Oral Daily  . montelukast  10 mg Oral Daily  . [START ON 12/05/2018] pantoprazole  40 mg Intravenous Q12H  . rosuvastatin  20 mg Oral q1800  . vitamin B-12  2,500 mcg Oral Daily  . vitamin E  800 Units Oral Daily   Infusions: . pantoprozole (PROTONIX) infusion 8 mg/hr (12/02/18 1132)   PRN Meds: acetaminophen, linaclotide, methocarbamol, ondansetron (ZOFRAN) IV   Allergies as of 12/01/2018  . (No Known Allergies)    Family History  Problem Relation Age of Onset  . Hypertension Mother   . Diabetes Mother   . Hyperlipidemia Mother   . Hypertension Father   . Hypertension Sister   . Cancer Sister     Social History   Socioeconomic History  . Marital status: Legally Separated    Spouse name: Not on file  . Number of children: Not on file  . Years of education: Not on file  . Highest education level: Not on file  Occupational History  . Not on file  Social Needs  . Financial resource strain: Not on file  . Food insecurity:    Worry: Not on file    Inability: Not on file  . Transportation needs:    Medical: Not on file    Non-medical: Not on file  Tobacco Use  . Smoking status: Former Smoker    Packs/day: 0.33    Years: 20.00    Pack years: 6.60    Types: Cigarettes    Last attempt to quit: 1985    Years since quitting: 35.1  . Smokeless tobacco: Never Used  Substance and Sexual Activity  . Alcohol use: Yes    Comment: 10/16/2018 "couple drinks/year"  . Drug use: Never  . Sexual activity: Not Currently  Lifestyle  . Physical activity:    Days per week: Not on  file    Minutes per session: Not on file  . Stress: Not on file  Relationships  . Social connections:    Talks on phone: Not on file    Gets together: Not on  file    Attends religious service: Not on file    Active member of club or organization: Not on file    Attends meetings of clubs or organizations: Not on file    Relationship status: Not on file  . Intimate partner violence:    Fear of current or ex partner: Not on file    Emotionally abused: Not on file    Physically abused: Not on file    Forced sexual activity: Not on file  Other Topics Concern  . Not on file  Social History Narrative  . Not on file    REVIEW OF SYSTEMS: ROS is O/W negative except as mentioned in HPI.   PHYSICAL EXAM: Vital signs in last 24 hours: Vitals:   12/02/18 0903 12/02/18 1151  BP: (!) 183/89 (!) 152/81  Pulse:  71  Resp:  16  Temp:  98.4 F (36.9 C)  SpO2:  100%   Wt Readings from Last 3 Encounters:  12/02/18 89.7 kg  11/18/18 97.1 kg  11/11/18 101.2 kg   General:  Alert, Well-developed, well-nourished, pleasant and cooperative in NAD Head:  Normocephalic and atraumatic. Eyes:  Sclera clear, no icterus.  Conjunctiva pink. Ears:  Normal auditory acuity. Mouth:  No deformity or lesions.   Lungs:  Clear throughout to auscultation.  No wheezes, crackles, or rhonchi.  Heart:  Regular rate and rhythm; no murmurs, clicks, rubs, or gallops. Abdomen:  Soft, non-distended.  BS present.  Non-tender.   Msk:  Symmetrical without gross deformities. Pulses:  Normal pulses noted. Extremities:  Without clubbing or edema. Neurologic:  Alert and oriented x 4;  grossly normal neurologically. Skin:  Intact without significant lesions or rashes. Psych:  Alert and cooperative. Normal mood and affect.  Intake/Output from previous day: 02/03 0701 - 02/04 0700 In: 500 [IV Piggyback:500] Out: -   LAB RESULTS: Recent Labs    12/01/18 2052 12/02/18 0612 12/02/18 1050  WBC 9.7 6.1  --   HGB 8.0* 7.4* 7.6*  HCT 25.5* 23.3* 23.9*  PLT 275 234  --    BMET Lab Results  Component Value Date   NA 138 12/02/2018   NA 137 12/01/2018   NA 138 11/25/2018     K 3.9 12/02/2018   K 4.3 12/01/2018   K 4.8 11/25/2018   CL 108 12/02/2018   CL 105 12/01/2018   CL 104 11/25/2018   CO2 22 12/02/2018   CO2 22 12/01/2018   CO2 24 11/25/2018   GLUCOSE 108 (H) 12/02/2018   GLUCOSE 138 (H) 12/01/2018   GLUCOSE 131 (H) 11/25/2018   BUN 27 (H) 12/02/2018   BUN 28 (H) 12/01/2018   BUN 25 (H) 11/25/2018   CREATININE 2.33 (H) 12/02/2018   CREATININE 2.32 (H) 12/01/2018   CREATININE 2.18 (H) 11/25/2018   CALCIUM 8.0 (L) 12/02/2018   CALCIUM 8.2 (L) 12/01/2018   CALCIUM 8.3 (L) 11/25/2018   PT/INR Lab Results  Component Value Date   INR 1.07 11/19/2017   RADIOLOGY STUDIES: Dg Chest 2 View  Result Date: 12/01/2018 CLINICAL DATA:  Heartburn, weakness, multiple falls, and nausea for 1 day. Former smoker. History of diabetes and hypertension. EXAM: CHEST - 2 VIEW COMPARISON:  11/25/2018 FINDINGS: Shallow inspiration. Cardiac enlargement. Chronic blunting of left costophrenic angle  suggesting pleural thickening. Infiltration previously seen in the right lung has mostly resolved. No developing consolidation. No pneumothorax. Bronchiectasis demonstrated in the right mid lung. Soft tissue metallic density posterior to the thoracic spine. Degenerative changes in the spine. IMPRESSION: Cardiac enlargement. Chronic blunting of left costophrenic angle. No evidence of active pulmonary disease. Electronically Signed   By: Lucienne Capers M.D.   On: 12/01/2018 21:34   ENDOSCOPIC STUDIES: EGD 06/02/2012 - Dr. Janelle Floor - [facility] - normal esophagus, mild gastric erythema, normal duodenum  Colonoscopy 06/02/2012 - normal colon, internal hemorrhoids  IMPRESSION:  1.  Acute on chronic anemia with questionable history of intermittent "dark stools":  Chronic anemia due to chronic disease, specifically CKD, but trending down.  No definite sign of overt bleeding.  Stool hemoccult has been ordered but no performed.  ? Iron studies more c/w AOCD rather  than IDA and MCV is normal. 2.  Antiplatelet use with Brilinta for CAD:  Received a dose of Brilinta very early this AM, but has now been put on hold. 3.  Dysphagia, reflux:  EGD in 2013 showed gastritis, but recent esophagram shows cervical web, moderate esophageal dysmotility likely related to reflux, and some other incidental findings.  Has been somewhat better on omeprazole 20 mg BID. 4. Chronic constipation:  On Linzess 290 mcg daily at home.  This has been ordered for here but was placed as prn upon admission.   PLAN:    *Final plans per Dr. Loletha Carrow.  Not sure that we will plan to scope him during hospitalization. *Monitor Hgb and consider transfusing due to cardiac history. *Does not need PPI gtt.  Will just place on pantoprazole 40 mg IV daily for now until we decide on procedure and diet is advanced.  Alonza Bogus, PA-C  12/02/2018, 12:17 PM Phone [phone]  I have reviewed the entire case in detail with the above APP and discussed the plan in detail.  Therefore, I agree with the diagnoses recorded above. In addition,  I have personally interviewed and examined the patient and have personally reviewed any abdominal/pelvic CT scan images.  My additional thoughts are as follows:  This patient's overall clinical picture is most consistent with anemia of chronic kidney disease.  He has some upper digestive symptoms of reflux and dysphagia, barium esophagram shows dysmotility. He now says he is able to swallow solid food without difficulty.  He had an episode of chest pain or regurgitation yesterday that frightened him. He was admitted with syncope and found to acute on normocytic anemia with a hemoglobin of 7.4 down from his baseline of about 9.  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  Given his overall condition and severe coronary disease upon which intervention cannot be taken, I strongly recommend transfusion of at least 1 unit PRBCs.  Consideration should also be given to  Epogen.  I am not currently planning an upper endoscopy because it is not clear that he has even occult GI bleeding.  Stool study is pending and he should be monitored closely over the next couple of days to see if there is any evidence of overt GI bleeding.  Brilinta will have to stay on hold until the question can be answered.   Nelida Meuse III Office:[phone]

## 2018-12-02 NOTE — Progress Notes (Signed)
Progress Note    William Webb [facility]  QQV:[id number] DOB: [birth year]  DOA: 12/01/2018 PCP: Billie Ruddy, MD    Brief Narrative:     Medical records reviewed and are as summarized below:  William Webb is an 68 y.o. male with medical history significant of DM2, CKD stage 3, HTN, HLD, CAD s/p stent for NSTEMI in late Dec 2019.  Patient diagnosed with PNA on 11/25/2018, declined admission to hospital so sent home on Levaquin.  Coughing has improved since then.  For past 2-3 days has had black tarry stool.  Today he felt like he had to have an episode of diarrhea and he stood up to walk to the bathroom and had a near syncopal event. He then fell 3 more times on the way to the bathroom.   Assessment/Plan:   Principal Problem:   Syncope Active Problems:   Essential hypertension   Normocytic normochromic anemia   CKD (chronic kidney disease) stage 3, GFR 30-59 ml/min (HCC)   Hyperlipidemia   Diabetes mellitus with renal complications (HCC)   CAD (coronary artery disease)   Malnutrition of moderate degree  Syncope - -vasovagal/ ? GI bleed -occult blood pending -holding ASA / Brilinta for the moment -- no recent stent - seen by cards-- troponin flat  ABLA -Fe/ferritin pending -transfuse to 10 due to CAD? -GI consult  CAD -recent heart cath -NO STENT -can be off brilenta/asa short term  HTN  -Continue home BP meds  DM2 - -SSI  CKD stage 3  -trend  HLD  - continue statin  Non-severe (moderate) malnutrition in context of chronic illness   Family Communication/Anticipated D/C date and plan/Code Status   DVT prophylaxis: scd Code Status: Full Code.  Family Communication:  Disposition Plan: pending h/h improvement   Medical Consultants:    GI  cards  Subjective:   No BM today  Objective:    Vitals:   12/02/18 0237 12/02/18 0745 12/02/18 0903 12/02/18 1151  BP: (!) 170/89 (!) 183/89 (!) 183/89 (!) 152/81  Pulse:  80  71    Resp:  16  16  Temp: 98.3 F (36.8 C) 98.1 F (36.7 C)  98.4 F (36.9 C)  TempSrc: Oral Oral  Oral  SpO2:  100%  100%  Weight: 89.7 kg     Height: 6\' 3"  (1.905 m)       Intake/Output Summary (Last 24 hours) at 12/02/2018 1309 Last data filed at 12/01/2018 2214 Gross per 24 hour  Intake 500 ml  Output -  Net 500 ml   Filed Weights   12/02/18 0237  Weight: 89.7 kg    Exam: In bed, NAD rrr No wheezing +BS, soft, NT A+Ox3  Data Reviewed:   I have personally reviewed following labs and imaging studies:  Labs: Labs show the following:   Basic Metabolic Panel: Recent Labs  Lab 11/25/18 2232 12/01/18 2052 12/02/18 0612  NA 138 137 138  K 4.8 4.3 3.9  CL 104 105 108  CO2 24 22 22   GLUCOSE 131* 138* 108*  BUN 25* 28* 27*  CREATININE 2.18* 2.32* 2.33*  CALCIUM 8.3* 8.2* 8.0*   GFR Estimated Creatinine Clearance: 36.3 mL/min (A) (by C-G formula based on SCr of 2.33 mg/dL (H)). Liver Function Tests: No results for input(s): AST, ALT, ALKPHOS, BILITOT, PROT, ALBUMIN in the last 168 hours. No results for input(s): LIPASE, AMYLASE in the last 168 hours. No results for input(s): AMMONIA in the last 168 hours. Coagulation profile  No results for input(s): INR, PROTIME in the last 168 hours.  CBC: Recent Labs  Lab 11/25/18 2232 12/01/18 2052 12/02/18 0612 12/02/18 1050  WBC 9.0 9.7 6.1  --   NEUTROABS  --  8.6*  --   --   HGB 8.9* 8.0* 7.4* 7.6*  HCT 28.1* 25.5* 23.3* 23.9*  MCV 90.6 86.7 86.0  --   PLT 255 275 234  --    Cardiac Enzymes: Recent Labs  Lab 12/01/18 2052 12/01/18 2258 12/02/18 0612 12/02/18 1050  TROPONINI 0.03* 0.03* 0.03* 0.03*   BNP (last 3 results) No results for input(s): PROBNP in the last 8760 hours. CBG: Recent Labs  Lab 12/01/18 2132 12/02/18 0037 12/02/18 0251 12/02/18 0745 12/02/18 1154  GLUCAP 121* 197* 176* 99 105*   D-Dimer: No results for input(s): DDIMER in the last 72 hours. Hgb A1c: No results for input(s):  HGBA1C in the last 72 hours. Lipid Profile: No results for input(s): CHOL, HDL, LDLCALC, TRIG, CHOLHDL, LDLDIRECT in the last 72 hours. Thyroid function studies: No results for input(s): TSH, T4TOTAL, T3FREE, THYROIDAB in the last 72 hours.  Invalid input(s): FREET3 Anemia work up: Recent Labs    12/02/18 1050  FERRITIN 212  TIBC 185*  IRON 35*   Sepsis Labs: Recent Labs  Lab 11/25/18 2232 11/26/18 0209 12/01/18 2052 12/02/18 0612  WBC 9.0  --  9.7 6.1  LATICACIDVEN  --  0.6  --   --     Microbiology No results found for this or any previous visit (from the past 240 hour(s)).  Procedures and diagnostic studies:  Dg Chest 2 View  Result Date: 12/01/2018 CLINICAL DATA:  Heartburn, weakness, multiple falls, and nausea for 1 day. Former smoker. History of diabetes and hypertension. EXAM: CHEST - 2 VIEW COMPARISON:  11/25/2018 FINDINGS: Shallow inspiration. Cardiac enlargement. Chronic blunting of left costophrenic angle suggesting pleural thickening. Infiltration previously seen in the right lung has mostly resolved. No developing consolidation. No pneumothorax. Bronchiectasis demonstrated in the right mid lung. Soft tissue metallic density posterior to the thoracic spine. Degenerative changes in the spine. IMPRESSION: Cardiac enlargement. Chronic blunting of left costophrenic angle. No evidence of active pulmonary disease. Electronically Signed   By: Lucienne Capers M.D.   On: 12/01/2018 21:34    Medications:   . acetaminophen  1,000 mg Oral BID  . amLODipine  10 mg Oral Daily  . cholecalciferol  1,000 Units Oral Daily  . feeding supplement  1 Container Oral TID BM  . gabapentin  1,200 mg Oral TID  . hydrALAZINE  100 mg Oral Q8H  . [START ON 12/03/2018] Influenza vac split quadrivalent PF  0.5 mL Intramuscular Tomorrow-1000  . insulin aspart  0-9 Units Subcutaneous Q4H  . isosorbide mononitrate  30 mg Oral Daily  . metoprolol tartrate  50 mg Oral Daily  . montelukast  10 mg  Oral Daily  . [START ON 12/05/2018] pantoprazole  40 mg Intravenous Q12H  . rosuvastatin  20 mg Oral q1800  . vitamin B-12  2,500 mcg Oral Daily  . vitamin E  800 Units Oral Daily   Continuous Infusions: . pantoprozole (PROTONIX) infusion 8 mg/hr (12/02/18 1132)     LOS: 0 days   Geradine Girt  [facility]   How to contact the [facility] Attending or Consulting provider [place] or covering provider during after hours [place], for this patient?  1. Check the care team in [facility] and look for a) attending/consulting [facility] provider listed  and b) the [facility] team listed 2. Log into [email or web address] and use [place]'s universal password to access. If you do not have the password, please contact the hospital operator. 3. Locate the [facility] provider you are looking for under [facility] and page to a number that you can be directly reached. 4. If you still have difficulty reaching the provider, please page the [facility] (Director on Call) for the Hospitalists listed on amion for assistance.  12/02/2018, 1:09 PM

## 2018-12-02 NOTE — ED Notes (Signed)
Patient states he is refusing flu swab at this time. He says he "bled out from nose yesterday and there are two things up his nostril that need to come out by themselves."

## 2018-12-02 NOTE — Telephone Encounter (Signed)
Copied from [place] [phone]. Topic: General - Other >> Dec 02, 2018  8:11 AM Leward Quan A wrote: Reason for CRM:  William Webb patient daughter called to inform that patient is in the hospital. She would like to be updated on Home care services with [facility] would like to know if the paper work has already been completed. Please advise

## 2018-12-03 DIAGNOSIS — I10 Essential (primary) hypertension: Secondary | ICD-10-CM | POA: Diagnosis not present

## 2018-12-03 DIAGNOSIS — R55 Syncope and collapse: Secondary | ICD-10-CM | POA: Diagnosis not present

## 2018-12-03 DIAGNOSIS — D649 Anemia, unspecified: Secondary | ICD-10-CM | POA: Diagnosis not present

## 2018-12-03 DIAGNOSIS — E1122 Type 2 diabetes mellitus with diabetic chronic kidney disease: Secondary | ICD-10-CM | POA: Diagnosis not present

## 2018-12-03 DIAGNOSIS — E440 Moderate protein-calorie malnutrition: Secondary | ICD-10-CM | POA: Diagnosis not present

## 2018-12-03 LAB — GLUCOSE, CAPILLARY
Glucose-Capillary: 107 mg/dL — ABNORMAL HIGH (ref 70–99)
Glucose-Capillary: 109 mg/dL — ABNORMAL HIGH (ref 70–99)
Glucose-Capillary: 118 mg/dL — ABNORMAL HIGH (ref 70–99)
Glucose-Capillary: 128 mg/dL — ABNORMAL HIGH (ref 70–99)
Glucose-Capillary: 130 mg/dL — ABNORMAL HIGH (ref 70–99)
Glucose-Capillary: 148 mg/dL — ABNORMAL HIGH (ref 70–99)
Glucose-Capillary: 210 mg/dL — ABNORMAL HIGH (ref 70–99)

## 2018-12-03 LAB — BASIC METABOLIC PANEL
Anion gap: 11 (ref 5–15)
BUN: 22 mg/dL (ref 8–23)
CO2: 21 mmol/L — ABNORMAL LOW (ref 22–32)
Calcium: 8.2 mg/dL — ABNORMAL LOW (ref 8.9–10.3)
Chloride: 104 mmol/L (ref 98–111)
Creatinine, Ser: 2.14 mg/dL — ABNORMAL HIGH (ref 0.61–1.24)
GFR calc Af Amer: 36 mL/min — ABNORMAL LOW (ref >60)
GFR calc non Af Amer: 31 mL/min — ABNORMAL LOW (ref >60)
Glucose, Bld: 133 mg/dL — ABNORMAL HIGH (ref 70–99)
Potassium: 3.9 mmol/L (ref 3.5–5.1)
Sodium: 136 mmol/L (ref 135–145)

## 2018-12-03 LAB — CBC
HCT: 24.1 % — ABNORMAL LOW (ref 39.0–52.0)
Hemoglobin: 7.9 g/dL — ABNORMAL LOW (ref 13.0–17.0)
MCH: 28.1 pg (ref 26.0–34.0)
MCHC: 32.8 g/dL (ref 30.0–36.0)
MCV: 85.8 fL (ref 80.0–100.0)
Platelets: 275 10*3/uL (ref 150–400)
RBC: 2.81 MIL/uL — ABNORMAL LOW (ref 4.22–5.81)
RDW: 13.3 % (ref 11.5–15.5)
WBC: 4.6 10*3/uL (ref 4.0–10.5)
nRBC: 0 % (ref 0.0–0.2)

## 2018-12-03 LAB — PREPARE RBC (CROSSMATCH)

## 2018-12-03 LAB — HEMOGLOBIN AND HEMATOCRIT, BLOOD
HCT: 27.3 % — ABNORMAL LOW (ref 39.0–52.0)
Hemoglobin: 8.8 g/dL — ABNORMAL LOW (ref 13.0–17.0)

## 2018-12-03 MED ORDER — SODIUM CHLORIDE 0.9% IV SOLUTION: Freq: Once | INTRAVENOUS | Status: DC

## 2018-12-03 MED ORDER — LINACLOTIDE 145 MCG PO CAPS
145.0000 ug | ORAL_CAPSULE | Freq: Every day | ORAL | Status: DC | PRN
  Administered 2018-12-03: 145 ug via ORAL
  Filled 2018-12-03 (×2): qty 1

## 2018-12-03 MED ORDER — METOPROLOL TARTRATE 5 MG/5ML IV SOLN
5.0000 mg | Freq: Four times a day (QID) | INTRAVENOUS | Status: DC | PRN
  Administered 2018-12-03 – 2018-12-04 (×2): 5 mg via INTRAVENOUS
  Filled 2018-12-03 (×2): qty 5

## 2018-12-03 MED ORDER — GLUCERNA SHAKE PO LIQD
237.0000 mL | Freq: Three times a day (TID) | ORAL | Status: DC
  Administered 2018-12-03: 237 mL via ORAL

## 2018-12-03 MED ORDER — ASPIRIN EC 81 MG PO TBEC
81.0000 mg | DELAYED_RELEASE_TABLET | Freq: Every day | ORAL | Status: DC
  Administered 2018-12-03 – 2018-12-04 (×2): 81 mg via ORAL
  Filled 2018-12-03 (×2): qty 1

## 2018-12-03 NOTE — Telephone Encounter (Signed)
Pt is currently in the hospital. This will wait for pt discharge

## 2018-12-03 NOTE — Progress Notes (Addendum)
[facility] Progress Note  CC:  Anemia  Subjective:  Hgb stable at 7.9 grams this AM but receiving one unit PRBC's.  Still no BM so no hemoccult performed.    Objective:  Vital signs in last 24 hours: Temp:  [97.8 F (36.6 C)-98.7 F (37.1 C)] 98.6 F (37 C) (02/05 0743) Pulse Rate:  [71-82] 82 (02/05 0743) Resp:  [12-16] 14 (02/05 0743) BP: (143-186)/(81-95) 186/95 (02/05 0743) SpO2:  [97 %-100 %] 97 % (02/05 0743) Last BM Date: 12/01/18 General:  Alert, Well-developed, in NAD Heart:  Regular rate and rhythm; no murmurs Pulm:  CTAB.  No increased WOB. Abdomen:  Soft, non-distended.  BS present.  Non-tender. Extremities:  Without edema. Neurologic:  Alert and oriented x 4;  grossly normal neurologically. Psych:  Alert and cooperative. Normal mood and affect.  Intake/Output from previous day: 02/04 0701 - 02/05 0700 In: -  Out: [place] [Urine:875]  Lab Results: Recent Labs    12/01/18 2052 12/02/18 0612 12/02/18 1050 12/03/18 0318  WBC 9.7 6.1  --  4.6  HGB 8.0* 7.4* 7.6* 7.9*  HCT 25.5* 23.3* 23.9* 24.1*  PLT 275 234  --  275   BMET Recent Labs    12/01/18 2052 12/02/18 0612 12/03/18 0318  NA 137 138 136  K 4.3 3.9 3.9  CL 105 108 104  CO2 22 22 21*  GLUCOSE 138* 108* 133*  BUN 28* 27* 22  CREATININE 2.32* 2.33* 2.14*  CALCIUM 8.2* 8.0* 8.2*   Dg Chest 2 View  Result Date: 12/01/2018 CLINICAL DATA:  Heartburn, weakness, multiple falls, and nausea for 1 day. Former smoker. History of diabetes and hypertension. EXAM: CHEST - 2 VIEW COMPARISON:  11/25/2018 FINDINGS: Shallow inspiration. Cardiac enlargement. Chronic blunting of left costophrenic angle suggesting pleural thickening. Infiltration previously seen in the right lung has mostly resolved. No developing consolidation. No pneumothorax. Bronchiectasis demonstrated in the right mid lung. Soft tissue metallic density posterior to the thoracic spine. Degenerative changes in the spine.  IMPRESSION: Cardiac enlargement. Chronic blunting of left costophrenic angle. No evidence of active pulmonary disease. Electronically Signed   By: Lucienne Capers M.D.   On: 12/01/2018 21:34   Assessment / Plan: 1.  Acute on chronic anemia with questionable history of intermittent "dark stools":  Chronic anemia due to chronic disease, specifically CKD, but trending down.  No definite sign of overt bleeding.  Stool hemoccult has been ordered but no performed.  ? 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  Hgb stable at 7.9 grams this AM but receiving one unit PRBC's. 2.  Antiplatelet use with Brilinta for CAD:  Received a dose of Brilinta very early AM on 2/4, but has now been on hold. 3.  Dysphagia, reflux:  EGD in 2013 showed gastritis, but recent esophagram shows cervical web, moderate esophageal dysmotility likely related to reflux, and some other incidental findings.  Has been somewhat better on omeprazole 20 mg BID. 4. Chronic constipation:  On Linzess at home.  This has been ordered for here but was placed as prn upon admission.  I lowered the dose to 145 mcg as he says that sometimes the 290 mcg is too strong.  *No procedures planned for now.  Will discuss with Dr. Loletha Carrow regarding further plans and resuming Brilinita.   LOS: 0 days   Laban Emperor. Zehr  12/03/2018, 9:09 AM  I have discussed the case with the PA, and that is the plan I  formulated. I personally interviewed and examined the patient.  Still not clear if there is GI blood loss.  Hgb better after transfusion.  If stool heme positive, then I will consider an EGD after he has been off Brilinta at least several days.    Nelida Meuse III Office: [phone]

## 2018-12-03 NOTE — Progress Notes (Signed)
Nutrition Follow-up  DOCUMENTATION CODES:   Non-severe (moderate) malnutrition in context of chronic illness  INTERVENTION:   -D/c Boost Breeze po TID, each supplement provides 250 kcal and 9 grams of protein -Glucerna Shake po TID, each supplement provides 220 kcal and 10 grams of protein  NUTRITION DIAGNOSIS:   Moderate Malnutrition related to chronic illness(CKD stage III, CAD s/p stent for NSTEMII) as evidenced by mild fat depletion, moderate fat depletion, mild muscle depletion, percent weight loss(12.1% weight loss in 1 month).  Ongoing  GOAL:   Patient will meet greater than or equal to 90% of their needs  Progressing  MONITOR:   PO intake, Supplement acceptance, Diet advancement, Weight trends, Labs  REASON FOR ASSESSMENT:   Malnutrition Screening Tool    ASSESSMENT:   68 year old male who presented to the ED on 2/3 after multiple syncopal events. PMH significant for T2DM, CAD s/p stent for NSTEMI in December 2019, HTN, HLD, CKD stage 3. Pt seen in the ED on 1/29 and diagnosed with PNA at that time.  Case discussed with RN prior to visit, who confirms that pt was advanced to a carb modified diet this morning. Per RN, pt is making a good transition from liquids to solids and has been choosing softer items such as cottage cheese and fruit.   Spoke with pt at bedside, who reports improving appetite. He reports his breakfast and grits and eggs "was too much" for him. He reports he has ordered some menu alternatives for dinner. He complains that the Boost Breeze is too sweet. Pt would like to transition to Glucerna, as he drinks this at home.   Labs reviewed: CBGS: 109-130 (inpatient orders for glycemic control are 0-9 units insulin aspart every 4 hours).   Diet Order:   Diet Order            Diet Carb Modified Fluid consistency: Thin; Room service appropriate? Yes  Diet effective now              EDUCATION NEEDS:   No education needs have been identified at  this time  Skin:  Skin Assessment: Reviewed RN Assessment  Last BM:  12/01/18  Height:   Ht Readings from Last 1 Encounters:  12/02/18 6\' 3"  (1.905 m)    Weight:   Wt Readings from Last 1 Encounters:  12/02/18 89.7 kg    Ideal Body Weight:  89.1 kg  BMI:  Body mass index is 24.72 kg/m.  Estimated Nutritional Needs:   Kcal:  2200-2400  Protein:  105-120 grams  Fluid:  >/= 2.2 L    William Webb A. Jimmye Norman, RD, LDN, CDE Pager: [phone] After hours Pager: [phone]

## 2018-12-03 NOTE — Progress Notes (Signed)
Progress Note    [name] [facility]  DQQ:[id number] DOB: 11-Sep-1951  DOA: 12/01/2018 PCP: Billie Ruddy, MD    Brief Narrative:     Medical records reviewed and are as summarized below:  William Webb is an 68 y.o. male with medical history significant of DM2, CKD stage 3, HTN, HLD, CAD s/p stent for NSTEMI in late Dec 2019.  Patient diagnosed with PNA on 11/25/2018, declined admission to hospital so sent home on Levaquin.  Coughing has improved since then.  For past 2-3 days has had black tarry stool.  Today he felt like he had to have an episode of diarrhea and he stood up to walk to the bathroom and had a near syncopal event. He then fell 3 more times on the way to the bathroom.   Assessment/Plan:   Principal Problem:   Syncope Active Problems:   Essential hypertension   Normocytic normochromic anemia   CKD (chronic kidney disease) stage 3, GFR 30-59 ml/min (HCC)   Hyperlipidemia   Diabetes mellitus with renal complications (HCC)   CAD (coronary artery disease)   Malnutrition of moderate degree  Syncope - -vasovagal -occult blood pending -holding ASA / Brilinta for the moment -- no recent stent - seen by cards-- troponin flat  Anemia Fe def-- did have a few days of melena-- so could be a component of ABLA -FE low -1 unit PRBC -GI consult appreciated-- continue to monitor off Brilenta-- no procedure just yet  CAD -recent heart cath -NO STENT -can be off brilenta/asa short term  HTN  -Continue home BP meds  DM2 - -SSI  CKD stage 3  -trend  HLD  - continue statin  Non-severe (moderate) malnutrition in context of chronic illness   Family Communication/Anticipated D/C date and plan/Code Status   DVT prophylaxis: scd Code Status: Full Code.  Family Communication:  Disposition Plan: pending h/h improvement/GI work up   Medical Consultants:    GI  cards  Subjective:   No chest pain  Objective:    Vitals:   12/03/18  0915 12/03/18 0930 12/03/18 1129 12/03/18 1200  BP: (!) 171/93 (!) 181/96 (!) 172/81 (!) 156/103  Pulse: 85 84 72 70  Resp: 19 13 14 18   Temp: 97.8 F (36.6 C) 97.8 F (36.6 C) 98.6 F (37 C) 98.6 F (37 C)  TempSrc: Oral Oral Oral   SpO2: 97% 99% 100% 98%  Weight:      Height:        Intake/Output Summary (Last 24 hours) at 12/03/2018 1611 Last data filed at 12/03/2018 1200 Gross per 24 hour  Intake 935 ml  Output 875 ml  Net 60 ml   Filed Weights   12/02/18 0237  Weight: 89.7 kg    Exam: NAD, in bed rrr No wheezing +BS, soft, NT No LE edema  Data Reviewed:   I have personally reviewed following labs and imaging studies:  Labs: Labs show the following:   Basic Metabolic Panel: Recent Labs  Lab 12/01/18 2052 12/02/18 0612 12/03/18 0318  NA 137 138 136  K 4.3 3.9 3.9  CL 105 108 104  CO2 22 22 21*  GLUCOSE 138* 108* 133*  BUN 28* 27* 22  CREATININE 2.32* 2.33* 2.14*  CALCIUM 8.2* 8.0* 8.2*   GFR Estimated Creatinine Clearance: 39.5 mL/min (A) (by C-G formula based on SCr of 2.14 mg/dL (H)). Liver Function Tests: No results for input(s): AST, ALT, ALKPHOS, BILITOT, PROT, ALBUMIN in the last 168 hours.  No results for input(s): LIPASE, AMYLASE in the last 168 hours. No results for input(s): AMMONIA in the last 168 hours. Coagulation profile No results for input(s): INR, PROTIME in the last 168 hours.  CBC: Recent Labs  Lab 12/01/18 2052 12/02/18 0612 12/02/18 1050 12/03/18 0318 12/03/18 1435  WBC 9.7 6.1  --  4.6  --   NEUTROABS 8.6*  --   --   --   --   HGB 8.0* 7.4* 7.6* 7.9* 8.8*  HCT 25.5* 23.3* 23.9* 24.1* 27.3*  MCV 86.7 86.0  --  85.8  --   PLT 275 234  --  275  --    Cardiac Enzymes: Recent Labs  Lab 12/01/18 2052 12/01/18 2258 12/02/18 0612 12/02/18 1050  TROPONINI 0.03* 0.03* 0.03* 0.03*   BNP (last 3 results) No results for input(s): PROBNP in the last 8760 hours. CBG: Recent Labs  Lab 12/02/18 1922 12/03/18 0011  12/03/18 0326 12/03/18 0747 12/03/18 1131  GLUCAP 106* 107* 118* 109* 130*   D-Dimer: No results for input(s): DDIMER in the last 72 hours. Hgb A1c: No results for input(s): HGBA1C in the last 72 hours. Lipid Profile: No results for input(s): CHOL, HDL, LDLCALC, TRIG, CHOLHDL, LDLDIRECT in the last 72 hours. Thyroid function studies: No results for input(s): TSH, T4TOTAL, T3FREE, THYROIDAB in the last 72 hours.  Invalid input(s): FREET3 Anemia work up: Recent Labs    12/02/18 1050  FERRITIN 212  TIBC 185*  IRON 35*   Sepsis Labs: Recent Labs  Lab 12/01/18 2052 12/02/18 0612 12/03/18 0318  WBC 9.7 6.1 4.6    Microbiology No results found for this or any previous visit (from the past 240 hour(s)).  Procedures and diagnostic studies:  Dg Chest 2 View  Result Date: 12/01/2018 CLINICAL DATA:  Heartburn, weakness, multiple falls, and nausea for 1 day. Former smoker. History of diabetes and hypertension. EXAM: CHEST - 2 VIEW COMPARISON:  11/25/2018 FINDINGS: Shallow inspiration. Cardiac enlargement. Chronic blunting of left costophrenic angle suggesting pleural thickening. Infiltration previously seen in the right lung has mostly resolved. No developing consolidation. No pneumothorax. Bronchiectasis demonstrated in the right mid lung. Soft tissue metallic density posterior to the thoracic spine. Degenerative changes in the spine. IMPRESSION: Cardiac enlargement. Chronic blunting of left costophrenic angle. No evidence of active pulmonary disease. Electronically Signed   By: Lucienne Capers M.D.   On: 12/01/2018 21:34    Medications:   . sodium chloride   Intravenous Once  . acetaminophen  1,000 mg Oral BID  . amLODipine  10 mg Oral Daily  . cholecalciferol  1,000 Units Oral Daily  . feeding supplement (GLUCERNA SHAKE)  237 mL Oral TID BM  . gabapentin  1,200 mg Oral TID  . hydrALAZINE  100 mg Oral Q8H  . Influenza vac split quadrivalent PF  0.5 mL Intramuscular  Tomorrow-1000  . insulin aspart  0-9 Units Subcutaneous Q4H  . isosorbide mononitrate  30 mg Oral Daily  . metoprolol tartrate  50 mg Oral Daily  . montelukast  10 mg Oral Daily  . pantoprazole  40 mg Oral QAC breakfast  . rosuvastatin  20 mg Oral q1800  . vitamin B-12  2,500 mcg Oral Daily  . vitamin E  800 Units Oral Daily   Continuous Infusions:    LOS: 0 days   Geradine Girt  [facility]   How to contact the [facility] Attending or Consulting provider [place] or covering provider during after hours [place], for  this patient?  1. Check the care team in [facility] and look for a) attending/consulting [facility] provider listed and b) the [facility] team listed 2. Log into [email or web address] and use [place]'s universal password to access. If you do not have the password, please contact the hospital operator. 3. Locate the [facility] provider you are looking for under [facility] and page to a number that you can be directly reached. 4. If you still have difficulty reaching the provider, please page the [facility] (Director on Call) for the Hospitalists listed on amion for assistance.  12/03/2018, 4:11 PM

## 2018-12-03 NOTE — Plan of Care (Signed)
  Problem: Education: Goal: Knowledge of General Education information will improve Description Including pain rating scale, medication(s)/side effects and non-pharmacologic comfort measures Outcome: Progressing   Problem: Health Behavior/Discharge Planning: Goal: Ability to manage health-related needs will improve Outcome: Progressing   Problem: Clinical Measurements: Goal: Ability to maintain clinical measurements within normal limits will improve Outcome: Progressing Goal: Diagnostic test results will improve Outcome: Progressing   Problem: Nutrition: Goal: Adequate nutrition will be maintained Outcome: Progressing   Problem: Elimination: Goal: Will not experience complications related to bowel motility Outcome: Progressing   Problem: Clinical Measurements: Goal: Will remain free from infection Outcome: Adequate for Discharge Goal: Respiratory complications will improve Outcome: Adequate for Discharge Goal: Cardiovascular complication will be avoided Outcome: Adequate for Discharge   Problem: Activity: Goal: Risk for activity intolerance will decrease Outcome: Adequate for Discharge   Problem: Coping: Goal: Level of anxiety will decrease Outcome: Adequate for Discharge   Problem: Elimination: Goal: Will not experience complications related to urinary retention Outcome: Adequate for Discharge   Problem: Pain Managment: Goal: General experience of comfort will improve Outcome: Adequate for Discharge   Problem: Safety: Goal: Ability to remain free from injury will improve Outcome: Adequate for Discharge   Problem: Skin Integrity: Goal: Risk for impaired skin integrity will decrease Outcome: Adequate for Discharge

## 2018-12-04 DIAGNOSIS — K5909 Other constipation: Secondary | ICD-10-CM

## 2018-12-04 DIAGNOSIS — R55 Syncope and collapse: Secondary | ICD-10-CM | POA: Diagnosis not present

## 2018-12-04 DIAGNOSIS — D649 Anemia, unspecified: Secondary | ICD-10-CM | POA: Diagnosis not present

## 2018-12-04 DIAGNOSIS — E1122 Type 2 diabetes mellitus with diabetic chronic kidney disease: Secondary | ICD-10-CM | POA: Diagnosis not present

## 2018-12-04 DIAGNOSIS — R1310 Dysphagia, unspecified: Secondary | ICD-10-CM | POA: Diagnosis not present

## 2018-12-04 DIAGNOSIS — N183 Chronic kidney disease, stage 3 (moderate): Secondary | ICD-10-CM | POA: Diagnosis not present

## 2018-12-04 DIAGNOSIS — Z794 Long term (current) use of insulin: Secondary | ICD-10-CM | POA: Diagnosis not present

## 2018-12-04 LAB — CBC
HCT: 27.5 % — ABNORMAL LOW (ref 39.0–52.0)
Hemoglobin: 8.9 g/dL — ABNORMAL LOW (ref 13.0–17.0)
MCH: 27.6 pg (ref 26.0–34.0)
MCHC: 32.4 g/dL (ref 30.0–36.0)
MCV: 85.1 fL (ref 80.0–100.0)
Platelets: 282 10*3/uL (ref 150–400)
RBC: 3.23 MIL/uL — ABNORMAL LOW (ref 4.22–5.81)
RDW: 13.4 % (ref 11.5–15.5)
WBC: 6.5 10*3/uL (ref 4.0–10.5)
nRBC: 0 % (ref 0.0–0.2)

## 2018-12-04 LAB — TYPE AND SCREEN
ABO/RH(D): A POS
Antibody Screen: NEGATIVE
Unit division: 0

## 2018-12-04 LAB — GLUCOSE, CAPILLARY
Glucose-Capillary: 154 mg/dL — ABNORMAL HIGH (ref 70–99)
Glucose-Capillary: 152 mg/dL — ABNORMAL HIGH (ref 70–99)
Glucose-Capillary: 211 mg/dL — ABNORMAL HIGH (ref 70–99)
Glucose-Capillary: 99 mg/dL (ref 70–99)

## 2018-12-04 LAB — BPAM RBC
Blood Product Expiration Date: 202002272359
ISSUE DATE / TIME: 202002050909
Unit Type and Rh: 6200

## 2018-12-04 LAB — BASIC METABOLIC PANEL
Anion gap: 11 (ref 5–15)
BUN: 23 mg/dL (ref 8–23)
CO2: 23 mmol/L (ref 22–32)
Calcium: 8.5 mg/dL — ABNORMAL LOW (ref 8.9–10.3)
Chloride: 103 mmol/L (ref 98–111)
Creatinine, Ser: 2.19 mg/dL — ABNORMAL HIGH (ref 0.61–1.24)
GFR calc Af Amer: 35 mL/min — ABNORMAL LOW (ref >60)
GFR calc non Af Amer: 30 mL/min — ABNORMAL LOW (ref >60)
Glucose, Bld: 232 mg/dL — ABNORMAL HIGH (ref 70–99)
Potassium: 4.2 mmol/L (ref 3.5–5.1)
Sodium: 137 mmol/L (ref 135–145)

## 2018-12-04 MED ORDER — INSULIN DEGLUDEC 100 UNIT/ML ~~LOC~~ SOPN: 5.0000 [IU] | PEN_INJECTOR | Freq: Every day | SUBCUTANEOUS | Status: DC

## 2018-12-04 MED ORDER — ACETAMINOPHEN 500 MG PO TABS: 1000.0000 mg | ORAL_TABLET | Freq: Two times a day (BID) | ORAL | 0 refills | Status: DC | PRN

## 2018-12-04 MED ORDER — GLUCERNA SHAKE PO LIQD: 237.0000 mL | Freq: Three times a day (TID) | ORAL | 0 refills | Status: DC

## 2018-12-04 MED ORDER — METOPROLOL TARTRATE 50 MG PO TABS: 75.0000 mg | ORAL_TABLET | Freq: Every day | ORAL | Status: DC

## 2018-12-04 MED ORDER — DIPHENHYDRAMINE HCL 12.5 MG/5ML PO ELIX
12.5000 mg | ORAL_SOLUTION | Freq: Once | ORAL | Status: AC
  Administered 2018-12-04: 12.5 mg via ORAL
  Filled 2018-12-04: qty 5

## 2018-12-04 MED ORDER — METOPROLOL TARTRATE 75 MG PO TABS: 75.0000 mg | ORAL_TABLET | Freq: Every day | ORAL | 0 refills | Status: DC

## 2018-12-04 MED ORDER — INSULIN GLARGINE 100 UNIT/ML ~~LOC~~ SOLN
5.0000 [IU] | Freq: Every day | SUBCUTANEOUS | Status: DC
  Filled 2018-12-04: qty 0.05

## 2018-12-04 MED ORDER — LINACLOTIDE 145 MCG PO CAPS: 145.0000 ug | ORAL_CAPSULE | Freq: Every day | ORAL | 0 refills | Status: DC | PRN

## 2018-12-04 MED ORDER — PNEUMOCOCCAL VAC POLYVALENT 25 MCG/0.5ML IJ INJ
0.5000 mL | INJECTION | INTRAMUSCULAR | Status: AC
  Administered 2018-12-04: 0.5 mL via INTRAMUSCULAR

## 2018-12-04 NOTE — Discharge Summary (Signed)
Physician Discharge Summary  William Webb [facility] MCN:[id number] DOB: 16-May-1951 DOA: 12/01/2018  PCP: Billie Ruddy, MD  Admit date: 12/01/2018 Discharge date: 12/04/2018  Admitted From: home Discharge disposition: home   Recommendations for Outpatient Follow-Up:   1. Close GI follow up 2. CBC 1 week 3. Further titration of BP medications for optimal control 4. Home health PT/RN   Discharge Diagnosis:   Principal Problem:   Syncope Active Problems:   Essential hypertension   Normocytic normochromic anemia   CKD (chronic kidney disease) stage 3, GFR 30-59 ml/min (HCC)   Hyperlipidemia   Diabetes mellitus with renal complications (HCC)   CAD (coronary artery disease)   Malnutrition of moderate degree    Discharge Condition: Improved.  Diet recommendation: Low sodium, heart healthy/carb mod  Wound care: None.  Code status: Full.   History of Present Illness:   William Webb is a 68 y.o. male with medical history significant of DM2, CKD stage 3, HTN, HLD, CAD s/p stent for NSTEMI in late Dec 2019.  Patient diagnosed with PNA on 11/25/2018, declined admission to hospital so sent home on Levaquin.  Coughing has improved since then.  For past 2-3 days has had black tarry stool.  Today he felt like he had to have an episode of diarrhea and he stood up to walk to the bathroom and had a near syncopal event. He then fell 3 more times on the way to the bathroom. He denies hitting his head. He denies any injuries from the fall. He states he was just feeling very fatigued and dizzy/lightheaded. He also had some burning to the center of his chest. He describes as indigestion type feeling. He had no shortness of breath. He states he has had some similar type indigestion with cardiac events in the past.  Patient notes he hasnt actually had any diarrhea and stool has been soft but formed and black in color.   Hospital Course by Problem:   Syncope  - -vasovagal - seen by cards-- troponin flat so not ACS -no further bleeding while in hospital -H/H stable  Anemia Fe def-- did have a few days of melena-- so could be a component of ABLA -s/p 1 unit PRBC -GI consult appreciated--feel this is largely anemia of chronic disease.  He could have both processes going on, so an upper endoscopy is reasonable.  However, it really should not be done until he is off of Brilinta at least 5 days.  Barium study suggested a possible mechanical lesion in the proximal esophagus, we do not know if dilation may need to be performed or biopsies taken.  Therefore the procedure should not be done this soon after the most recent Brilinta dose unless it were an emergency.outpatient upper endoscopy done for him very soon.  1 of the challenges is that it must be done in the hospital outpatient endoscopy lab rather than at our office because he had a non-STEMI in December.  CAD -recent heart cath -NO STENT -can be off brilenta/asa short term- resume for now- once endoscopy arranged, can hold  HTN  -Continue home BP meds- adjust as able for more optimal control  DM2 - -resume home meds  CKD stage 3  -trend -consider referral to nephology/hematology -? epo benefit  HLD  - continue statin  Non-severe (moderate) malnutrition in context of chronic illness  Patient did not qualify for inpatient and has reached maximal medical benefit from this hospitalization.  He will need VERY close  outpatient follow up  Medical Consultants:   GI   Discharge Exam:   Vitals:   12/04/18 0356 12/04/18 0843  BP: (!) 180/82 (!) 177/96  Pulse: 79   Resp: 13   Temp: 98.7 F (37.1 C)   SpO2: 98%    Vitals:   12/03/18 1800 12/03/18 2001 12/04/18 0356 12/04/18 0843  BP: (!) 164/87 (!) 178/88 (!) 180/82 (!) 177/96  Pulse: (!) 122 76 79   Resp: _0 Temp:  98.6 F (37 C) 98.7 F (37.1 C)   TempSrc:  Oral Oral   SpO2: 100% 100% 98%   Weight:       Height:        General exam: Appears calm and comfortable.   The results of significant diagnostics from this hospitalization (including imaging, microbiology, ancillary and laboratory) are listed below for reference.     Procedures and Diagnostic Studies:   Dg Chest 2 View  Result Date: 12/01/2018 CLINICAL DATA:  Heartburn, weakness, multiple falls, and nausea for 1 day. Former smoker. History of diabetes and hypertension. EXAM: CHEST - 2 VIEW COMPARISON:  11/25/2018 FINDINGS: Shallow inspiration. Cardiac enlargement. Chronic blunting of left costophrenic angle suggesting pleural thickening. Infiltration previously seen in the right lung has mostly resolved. No developing consolidation. No pneumothorax. Bronchiectasis demonstrated in the right mid lung. Soft tissue metallic density posterior to the thoracic spine. Degenerative changes in the spine. IMPRESSION: Cardiac enlargement. Chronic blunting of left costophrenic angle. No evidence of active pulmonary disease. Electronically Signed   By: Lucienne Capers M.D.   On: 12/01/2018 21:34     Labs:   Basic Metabolic Panel: Recent Labs  Lab 12/01/18 2052 12/02/18 0612 12/03/18 0318 12/04/18 0123  NA 137 138 136 137  K 4.3 3.9 3.9 4.2  CL 105 108 104 103  CO2 22 22 21* 23  GLUCOSE 138* 108* 133* 232*  BUN 28* 27* 22 23  CREATININE 2.32* 2.33* 2.14* 2.19*  CALCIUM 8.2* 8.0* 8.2* 8.5*   GFR Estimated Creatinine Clearance: 38.6 mL/min (A) (by C-G formula based on SCr of 2.19 mg/dL (H)). Liver Function Tests: No results for input(s): AST, ALT, ALKPHOS, BILITOT, PROT, ALBUMIN in the last 168 hours. No results for input(s): LIPASE, AMYLASE in the last 168 hours. No results for input(s): AMMONIA in the last 168 hours. Coagulation profile No results for input(s): INR, PROTIME in the last 168 hours.  CBC: Recent Labs  Lab 12/01/18 2052 12/02/18 0612 12/02/18 1050 12/03/18 0318 12/03/18 1435 12/04/18 0123  WBC 9.7 6.1  --  4.6   --  6.5  NEUTROABS 8.6*  --   --   --   --   --   HGB 8.0* 7.4* 7.6* 7.9* 8.8* 8.9*  HCT 25.5* 23.3* 23.9* 24.1* 27.3* 27.5*  MCV 86.7 86.0  --  85.8  --  85.1  PLT 275 234  --  275  --  282   Cardiac Enzymes: Recent Labs  Lab 12/01/18 2052 12/01/18 2258 12/02/18 0612 12/02/18 1050  TROPONINI 0.03* 0.03* 0.03* 0.03*   BNP: Invalid input(s): POCBNP CBG: Recent Labs  Lab 12/03/18 1958 12/03/18 2224 12/03/18 2356 12/04/18 0359 12/04/18 0823  GLUCAP 148* 210* 211* 154* 152*   D-Dimer No results for input(s): DDIMER in the last 72 hours. Hgb A1c No results for input(s): HGBA1C in the last 72 hours. Lipid Profile No results for input(s): CHOL, HDL, LDLCALC, TRIG, CHOLHDL, LDLDIRECT in the last 72 hours. Thyroid function studies No  results for input(s): TSH, T4TOTAL, T3FREE, THYROIDAB in the last 72 hours.  Invalid input(s): FREET3 Anemia work up Recent Labs    12/02/18 1050  FERRITIN 212  TIBC 185*  IRON 35*   Microbiology No results found for this or any previous visit (from the past 240 hour(s)).   Discharge Instructions:   Discharge Instructions    Diet - low sodium heart healthy   Complete by:  As directed    Diet Carb Modified   Complete by:  As directed    Discharge instructions   Complete by:  As directed    Cbc 1 week   Increase activity slowly   Complete by:  As directed      Allergies as of 12/04/2018   No Known Allergies     Medication List    STOP taking these medications   levofloxacin 500 MG tablet Commonly known as:  LEVAQUIN     TAKE these medications   ACCU-CHEK GUIDE ME w/Device Kit 1 Device by Does not apply route 4 (four) times daily -  before meals and at bedtime.   acetaminophen 500 MG tablet Commonly known as:  TYLENOL Take 2 tablets (1,000 mg total) by mouth 2 (two) times daily as needed for moderate pain. What changed:    when to take this  reasons to take this   amLODipine 10 MG tablet Commonly known as:   NORVASC Take 1 tablet (10 mg total) by mouth daily.   aspirin EC 81 MG tablet Take 1 tablet (81 mg total) by mouth daily.   cholecalciferol 25 MCG (1000 UT) tablet Commonly known as:  VITAMIN D Take 1,000 Units by mouth daily.   Cyanocobalamin 2500 MCG Chew Chew 2,500 mcg by mouth daily.   feeding supplement (GLUCERNA SHAKE) Liqd Take 237 mLs by mouth 3 (three) times daily between meals.   FREESTYLE LIBRE 14 DAY SENSOR Misc 1 each by Does not apply route daily.   furosemide 20 MG tablet Commonly known as:  LASIX Take 20-40 mg by mouth See admin instructions. Take 2 tablets in the morning and take 1 tablet in the evening   gabapentin 600 MG tablet Commonly known as:  NEURONTIN Take 2 tablets (1,200 mg total) by mouth 3 (three) times daily.   hydrALAZINE 100 MG tablet Commonly known as:  APRESOLINE Take 1 tablet (100 mg total) by mouth every 8 (eight) hours.   insulin degludec 100 UNIT/ML Sopn FlexTouch Pen Commonly known as:  TRESIBA FLEXTOUCH Inject 0.05 mLs (5 Units total) into the skin daily. DO NOT TAKE IF CBG'S ARE LESS THAN 150 What changed:  how much to take   Insulin Pen Needle 32G X 4 MM Misc Commonly known as:  BD PEN NEEDLE NANO U/F USE AS DIRECTED FOR TRESIBA INJECTIONS ONCE DAILY   isosorbide mononitrate 30 MG 24 hr tablet Commonly known as:  IMDUR Take 30 mg by mouth daily.   linaclotide 145 MCG Caps capsule Commonly known as:  LINZESS Take 1 capsule (145 mcg total) by mouth daily as needed (constipation). What changed:    medication strength  how much to take  when to take this  reasons to take this   methocarbamol 500 MG tablet Commonly known as:  ROBAXIN TAKE 1 TABLET BY MOUTH TWICE DAILY AS NEEDED FOR MUSCLE SPASM What changed:  See the new instructions.   Metoprolol Tartrate 75 MG Tabs Take 75 mg by mouth daily. Start taking on:  December 05, 2018 What changed:  medication strength  how much to take   montelukast 10 MG  tablet Commonly known as:  SINGULAIR Take 1 tablet (10 mg total) by mouth at bedtime. What changed:  when to take this   omeprazole 20 MG capsule Commonly known as:  PRILOSEC Take 20 mg by mouth 2 (two) times daily.   rosuvastatin 20 MG tablet Commonly known as:  CRESTOR Take 1 tablet (20 mg total) by mouth daily at 6 PM.   ticagrelor 90 MG Tabs tablet Commonly known as:  BRILINTA Take 1 tablet (90 mg total) by mouth 2 (two) times daily.   vitamin E 1000 UNIT capsule Generic drug:  vitamin E Take 1,000 Units by mouth daily.      Follow-up Information    Billie Ruddy, MD Follow up in 1 week(s).   Specialty:  Family Medicine Why:  cbc Contact information: [place] [address] [phone]        Doran Stabler, MD Follow up.   Specialty:  Gastroenterology Why:  office will call with follow up appointment Contact information: [place] [address] [phone]            Time coordinating discharge: 25 min  Signed:  Geradine Girt DO  [facility] 12/04/2018, 11:38 AM

## 2018-12-04 NOTE — Care Management Note (Addendum)
Case Management Note  Patient Details  Name: William Webb MRN: [id number] Date of Birth: 02/24/1951  Subjective/Objective:                    Action/Plan:   PTA from home with daughter.     Expected Discharge Date:  12/04/18               Expected Discharge Plan:  [place]  In-House Referral:     Discharge planning Services  CM Consult  Post Acute Care Choice:    Choice offered to:  Patient, Adult Children  DME Arranged:    DME Agency:     HH Arranged:  RN, PT [place] Agency:  [place]  Status of Service:  Completed, signed off  If discussed at [place] of Stay Meetings, dates discussed:    Additional Comments: Declined HH:  [facility], [facility], [facility].  Within [employer] agencies;  [facility], [facility] and [facility].  CM provided medicare.gov list to pt with agencies that accept insurance - pt /daughter chose [facility] - agency accepted referral  Left VM for [place] and Meyer Cory, RN 12/04/2018, 12:37 PM

## 2018-12-04 NOTE — Progress Notes (Addendum)
[facility] Progress Note  CC:  Anemia  Subjective:  Says that he had a liquid "black stool" early this AM but he flushed it so nobody saw it and was not hemocculted.  Hgb 8.9 grams s/p one unit PRBC's yesterday.  Objective:  Vital signs in last 24 hours: Temp:  [97.8 F (36.6 C)-98.7 F (37.1 C)] 98.7 F (37.1 C) (02/06 0356) Pulse Rate:  [70-122] 79 (02/06 0356) Resp:  [10-19] 13 (02/06 0356) BP: (156-181)/(81-103) 177/96 (02/06 0843) SpO2:  [98 %-100 %] 98 % (02/06 0356) Last BM Date: 12/03/18 General:  Alert, Well-developed, in NAD Heart:  Regular rate and rhythm; no murmurs Pulm:  CTAB.  No increased WOB. Abdomen:  Soft, non-distended.  BS present.  Non-tender.  Extremities:  Without edema. Neurologic:  Alert and oriented x 4;  grossly normal neurologically. Psych:  Alert and cooperative. Normal mood and affect.  Intake/Output from previous day: 02/05 0701 - 02/06 0700 In: 1175 [P.O.:840; Blood:335] Out: 350 [Urine:350]  Lab Results: Recent Labs    12/02/18 0612  12/03/18 0318 12/03/18 1435 12/04/18 0123  WBC 6.1  --  4.6  --  6.5  HGB 7.4*   < > 7.9* 8.8* 8.9*  HCT 23.3*   < > 24.1* 27.3* 27.5*  PLT 234  --  275  --  282   < > = values in this interval not displayed.   BMET Recent Labs    12/02/18 0612 12/03/18 0318 12/04/18 0123  NA 138 136 137  K 3.9 3.9 4.2  CL 108 104 103  CO2 22 21* 23  GLUCOSE 108* 133* 232*  BUN 27* 22 23  CREATININE 2.33* 2.14* 2.19*  CALCIUM 8.0* 8.2* 8.5*   Assessment / Plan: 1.Acute on chronic anemiawith questionable history of intermittent "dark stools":Chronic anemia due to chronic disease, specifically CKD, but trending down upon admission. No definite sign of overt bleeding. Stool hemoccult has been ordered but no performed. 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  Hgb improved to 8.9 grams s/p one unit PRBC's. 2. Antiplatelet use with Brilinta for CAD: Received a  dose of Brilinta very early AM on 2/4, but has now been on hold. 3. Dysphagia, reflux: EGD in 2013 showed gastritis, but recent esophagram shows cervical web, moderate esophageal dysmotility likely related to reflux, and some other incidental findings. Has been somewhat better on omeprazole 20 mg BID. 4. Chronic constipation: On Linzess at home. This has been ordered for here but was placed as prn upon admission.  I lowered the dose to 145 mcg as he says that sometimes the 290 mcg is too strong.  *Hard to know what the correct plan of action is here.  No definite sign of overt bleeding.  The options would be to keep the patient for a couple more days and could do EGD early next week once Brilinta is washed out.  Apparently he does not meet inpatient status, however, so other option would be to discharge him on his Brilinta and have him follow-up in our office next week to discuss timing of EGD and repeat CBC.  Cannot have procedure in our [place] due to recent NSTEMI in December 2019.  His OV follow-up is listed in his discharge information.   LOS: 0 days   Laban Emperor. Zehr  12/04/2018, 9:29 AM  I have discussed the case with the PA, and we have formulated a plan.  I personally interviewed and examined the  patient.  At this point, it seems we cannot determine with certainty if this patient truly has GI bleeding.  Fortunately, his hemoglobin responded well to a unit of blood.  It seems we will have to assume he could have been having some upper GI bleeding that caused reported "dark stool" and declining hemoglobin.  I still feel this is largely anemia of chronic disease.  He could have both processes going on, so an upper endoscopy is reasonable.  However, it really should not be done until he is off of Brilinta at least 5 days.  Barium study suggested a possible mechanical lesion in the proximal esophagus, we do not know if dilation may need to be performed or biopsies taken.  Therefore the procedure  should not be done this soon after the most recent Brilinta dose unless it were an emergency.  If he does not meet criteria for further inpatient stay, then we will make an effort to have an outpatient upper endoscopy done for him very soon.  1 of the challenges is that it must be done in the hospital outpatient endoscopy lab rather than at our office because he had a non-STEMI in December.  We will coordinate this with his primary team and make a plan for discharge follow-up.    Nelida Meuse III Office: [phone]

## 2018-12-04 NOTE — Progress Notes (Signed)
Patient given all discharge instructions w/follow-up appts.  Reviewed medication list extensively.  Patient has information to call [place] for [facility] services to f/u.  Patient ambulated w/staff to [facility] where his daughter is also a patient.  He will then have a nephew come pick him up and drive him home.

## 2018-12-08 ENCOUNTER — Telehealth: Payer: Self-pay | Admitting: Family Medicine

## 2018-12-08 NOTE — Telephone Encounter (Unsigned)
Copied from [place] [id number]. Topic: Quick Communication - Home Health Verbal Orders >> Dec 08, 2018  3:05 PM Yvette Rack wrote: Caller/Agency: Deforest Hoyles with [facility] Number: [phone] Requesting OT/PT/Skilled Nursing/Social Work: PT Frequency: 1 time a week for 4 weeks

## 2018-12-09 ENCOUNTER — Other Ambulatory Visit (HOSPITAL_COMMUNITY): Payer: Self-pay | Admitting: *Deleted

## 2018-12-09 ENCOUNTER — Ambulatory Visit (INDEPENDENT_AMBULATORY_CARE_PROVIDER_SITE_OTHER): Payer: BLUE CROSS/BLUE SHIELD | Admitting: Family Medicine

## 2018-12-09 ENCOUNTER — Encounter: Payer: Self-pay | Admitting: Family Medicine

## 2018-12-09 VITALS — BP 158/62 | HR 92 | Temp 99.8°F | Wt 204.0 lb

## 2018-12-09 DIAGNOSIS — I10 Essential (primary) hypertension: Secondary | ICD-10-CM | POA: Diagnosis not present

## 2018-12-09 DIAGNOSIS — E1122 Type 2 diabetes mellitus with diabetic chronic kidney disease: Secondary | ICD-10-CM | POA: Diagnosis not present

## 2018-12-09 DIAGNOSIS — D638 Anemia in other chronic diseases classified elsewhere: Secondary | ICD-10-CM

## 2018-12-09 DIAGNOSIS — N183 Chronic kidney disease, stage 3 unspecified: Secondary | ICD-10-CM

## 2018-12-09 DIAGNOSIS — D6489 Other specified anemias: Secondary | ICD-10-CM | POA: Diagnosis not present

## 2018-12-09 DIAGNOSIS — Z794 Long term (current) use of insulin: Secondary | ICD-10-CM

## 2018-12-09 LAB — CBC WITH DIFFERENTIAL/PLATELET
Basophils Absolute: 0 10*3/uL (ref 0.0–0.1)
Basophils Relative: 0.2 % (ref 0.0–3.0)
Eosinophils Absolute: 0.1 10*3/uL (ref 0.0–0.7)
Eosinophils Relative: 0.5 % (ref 0.0–5.0)
HCT: 26 % — ABNORMAL LOW (ref 39.0–52.0)
Hemoglobin: 8.7 g/dL — ABNORMAL LOW (ref 13.0–17.0)
Lymphocytes Relative: 3.4 % — ABNORMAL LOW (ref 12.0–46.0)
Lymphs Abs: 0.5 10*3/uL — ABNORMAL LOW (ref 0.7–4.0)
MCHC: 33.5 g/dL (ref 30.0–36.0)
MCV: 84.9 fl (ref 78.0–100.0)
Monocytes Absolute: 0.6 10*3/uL (ref 0.1–1.0)
Monocytes Relative: 4.1 % (ref 3.0–12.0)
Neutro Abs: 12.5 10*3/uL — ABNORMAL HIGH (ref 1.4–7.7)
Neutrophils Relative %: 91.8 % — ABNORMAL HIGH (ref 43.0–77.0)
Platelets: 264 10*3/uL (ref 150.0–400.0)
RBC: 3.06 Mil/uL — ABNORMAL LOW (ref 4.22–5.81)
RDW: 14.3 % (ref 11.5–15.5)
WBC: 13.6 10*3/uL — ABNORMAL HIGH (ref 4.0–10.5)

## 2018-12-09 NOTE — Telephone Encounter (Signed)
Spoke with Merry Proud with [facility] with approval for verbal orders as requested

## 2018-12-09 NOTE — Discharge Instructions (Signed)
Epoetin Alfa injection °What is this medicine? °EPOETIN ALFA (e POE e tin AL fa) helps your body make more red blood cells. This medicine is used to treat anemia caused by chronic kidney disease, cancer chemotherapy, or HIV-therapy. It may also be used before surgery if you have anemia. °This medicine may be used for other purposes; ask your health care provider or pharmacist if you have questions. °COMMON BRAND NAME(S): Epogen, Procrit, Retacrit °What should I tell my health care provider before I take this medicine? °They need to know if you have any of these conditions: °-cancer °-heart disease °-high blood pressure °-history of blood clots °-history of stroke °-low levels of folate, iron, or vitamin B12 in the blood °-seizures °-an unusual or allergic reaction to erythropoietin, albumin, benzyl alcohol, hamster proteins, other medicines, foods, dyes, or preservatives °-pregnant or trying to get pregnant °-breast-feeding °How should I use this medicine? °This medicine is for injection into a vein or under the skin. It is usually given by a health care professional in a hospital or clinic setting. °If you get this medicine at home, you will be taught how to prepare and give this medicine. Use exactly as directed. Take your medicine at regular intervals. Do not take your medicine more often than directed. °It is important that you put your used needles and syringes in a special sharps container. Do not put them in a trash can. If you do not have a sharps container, call your pharmacist or healthcare provider to get one. °A special MedGuide will be given to you by the pharmacist with each prescription and refill. Be sure to read this information carefully each time. °Talk to your pediatrician regarding the use of this medicine in children. While this drug may be prescribed for selected conditions, precautions do apply. °Overdosage: If you think you have taken too much of this medicine contact a poison control center  or emergency room at once. °NOTE: This medicine is only for you. Do not share this medicine with others. °What if I miss a dose? °If you miss a dose, take it as soon as you can. If it is almost time for your next dose, take only that dose. Do not take double or extra doses. °What may interact with this medicine? °Interactions have not been studied. °This list may not describe all possible interactions. Give your health care provider a list of all the medicines, herbs, non-prescription drugs, or dietary supplements you use. Also tell them if you smoke, drink alcohol, or use illegal drugs. Some items may interact with your medicine. °What should I watch for while using this medicine? °Your condition will be monitored carefully while you are receiving this medicine. °You may need blood work done while you are taking this medicine. °This medicine may cause a decrease in vitamin B6. You should make sure that you get enough vitamin B6 while you are taking this medicine. Discuss the foods you eat and the vitamins you take with your health care professional. °What side effects may I notice from receiving this medicine? °Side effects that you should report to your doctor or health care professional as soon as possible: °-allergic reactions like skin rash, itching or hives, swelling of the face, lips, or tongue °-seizures °-signs and symptoms of a blood clot such as breathing problems; changes in vision; chest pain; severe, sudden headache; pain, swelling, warmth in the leg; trouble speaking; sudden numbness or weakness of the face, arm or leg °-signs and symptoms of a stroke   like changes in vision; confusion; trouble speaking or understanding; severe headaches; sudden numbness or weakness of the face, arm or leg; trouble walking; dizziness; loss of balance or coordination °Side effects that usually do not require medical attention (report to your doctor or health care professional if they continue or are  bothersome): °-chills °-cough °-dizziness °-fever °-headaches °-joint pain °-muscle cramps °-muscle pain °-nausea, vomiting °-pain, redness, or irritation at site where injected °This list may not describe all possible side effects. Call your doctor for medical advice about side effects. You may report side effects to FDA at 1-800-FDA-1088. °Where should I keep my medicine? °Keep out of the reach of children. °Store in a refrigerator between 2 and 8 degrees C (36 and 46 degrees F). Do not freeze or shake. Throw away any unused portion if using a single-dose vial. Multi-dose vials can be kept in the refrigerator for up to 21 days after the initial dose. Throw away unused medicine. °NOTE: This sheet is a summary. It may not cover all possible information. If you have questions about this medicine, talk to your doctor, pharmacist, or health care provider. °© 2019 [employer] (2017-05-24 08:35:19) °Ferumoxytol injection °What is this medicine? °FERUMOXYTOL is an iron complex. Iron is used to make healthy red blood cells, which carry oxygen and nutrients throughout the body. This medicine is used to treat iron deficiency anemia. °This medicine may be used for other purposes; ask your health care provider or pharmacist if you have questions. °COMMON BRAND NAME(S): Feraheme °What should I tell my health care provider before I take this medicine? °They need to know if you have any of these conditions: °-anemia not caused by low iron levels °-high levels of iron in the blood °-magnetic resonance imaging (MRI) test scheduled °-an unusual or allergic reaction to iron, other medicines, foods, dyes, or preservatives °-pregnant or trying to get pregnant °-breast-feeding °How should I use this medicine? °This medicine is for injection into a vein. It is given by a health care professional in a hospital or clinic setting. °Talk to your pediatrician regarding the use of this medicine in children. Special care may be  needed. °Overdosage: If you think you have taken too much of this medicine contact a poison control center or emergency room at once. °NOTE: This medicine is only for you. Do not share this medicine with others. °What if I miss a dose? °It is important not to miss your dose. Call your doctor or health care professional if you are unable to keep an appointment. °What may interact with this medicine? °This medicine may interact with the following medications: °-other iron products °This list may not describe all possible interactions. Give your health care provider a list of all the medicines, herbs, non-prescription drugs, or dietary supplements you use. Also tell them if you smoke, drink alcohol, or use illegal drugs. Some items may interact with your medicine. °What should I watch for while using this medicine? °Visit your doctor or healthcare professional regularly. Tell your doctor or healthcare professional if your symptoms do not start to get better or if they get worse. You may need blood work done while you are taking this medicine. °You may need to follow a special diet. Talk to your doctor. Foods that contain iron include: whole grains/cereals, dried fruits, beans, or peas, leafy green vegetables, and organ meats (liver, kidney). °What side effects may I notice from receiving this medicine? °Side effects that you should report to your doctor or health care   professional as soon as possible: °-allergic reactions like skin rash, itching or hives, swelling of the face, lips, or tongue °-breathing problems °-changes in blood pressure °-feeling faint or lightheaded, falls °-fever or chills °-flushing, sweating, or hot feelings °-swelling of the ankles or feet °Side effects that usually do not require medical attention (report to your doctor or health care professional if they continue or are bothersome): °-diarrhea °-headache °-nausea, vomiting °-stomach pain °This list may not describe all possible side effects.  Call your doctor for medical advice about side effects. You may report side effects to FDA at 1-800-FDA-1088. °Where should I keep my medicine? °This drug is given in a hospital or clinic and will not be stored at home. °NOTE: This sheet is a summary. It may not cover all possible information. If you have questions about this medicine, talk to your doctor, pharmacist, or health care provider. °© 2019 [employer] (2016-12-03 20:21:10) ° °

## 2018-12-09 NOTE — Patient Instructions (Addendum)
Chronic Kidney Disease, Adult Chronic kidney disease (CKD) occurs when the kidneys become damaged slowly over a long period of time. The kidneys are a pair of organs that do many important jobs in the body, including:  Removing waste and extra fluid from the blood to make urine.  Making hormones that maintain the amount of fluid in tissues and blood vessels.  Maintaining the right amount of fluids and chemicals in the body. A small amount of kidney damage may not cause problems, but a large amount of damage may make it hard or impossible for the kidneys to work the way they should. If steps are not taken to slow down kidney damage or to stop it from getting worse, the kidneys may stop working permanently (end-stage renal disease or ESRD). Most of the time, CKD does not go away, but it can often be controlled. People who have CKD are usually able to live normal lives. What are the causes? The most common causes of this condition are diabetes and high blood pressure (hypertension). Other causes include:  Heart and blood vessel (cardiovascular) disease.  Kidney diseases, such as: ? Glomerulonephritis. ? Interstitial nephritis. ? Polycystic kidney disease. ? Renal vascular disease.  Diseases that affect the immune system.  Genetic diseases.  Medicines that damage the kidneys, such as anti-inflammatory medicines.  Being around or being in contact with poisonous (toxic) substances.  A kidney or urinary infection that occurs again and again (recurs).  Vasculitis. This is swelling or inflammation of the blood vessels.  A problem with urine flow that may be caused by: ? Cancer. ? Having kidney stones more than one time. ? An enlarged prostate, in males. What increases the risk? You are more likely to develop this condition if you:  Are older than age 44.  Are male.  Are African-American, Hispanic, Asian, [place], or American [place].  Are a current or former  smoker.  Are obese.  Have a family history of kidney disease or failure.  Often take medicines that are damaging to the kidneys. What are the signs or symptoms? Symptoms of this condition include:  Swelling (edema) of the face, legs, ankles, or feet.  Tiredness (lethargy) and having less energy.  Nausea or vomiting.  Confusion or trouble concentrating.  Problems with urination, such as: ? Painful or burning feeling during urination. ? Decreased urine production. ? Frequent urination, especially at night. ? Bloody urine.  Muscle twitches and cramps, especially in the legs.  Shortness of breath.  Weakness.  Loss of appetite.  Metallic taste in the mouth.  Trouble sleeping.  Dry, itchy skin.  A low blood count (anemia).  Pale lining of the eyelids and surface of the eye (conjunctiva). Symptoms develop slowly and may not be obvious until the kidney damage becomes severe. It is possible to have kidney disease for years without having any symptoms. How is this diagnosed? This condition may be diagnosed based on:  Blood tests.  Urine tests.  Imaging tests, such as an ultrasound or CT scan.  A test in which a sample of tissue is removed from the kidneys to be examined under a microscope (kidney biopsy). These test results will help your health care provider determine how serious the CKD is. How is this treated? There is no cure for most cases of this condition, but treatment usually relieves symptoms and prevents or slows the progression of the disease. Treatment may include:  Making diet changes, which may require you to avoid alcohol, salty foods (sodium),  and foods that are high in potassium, calcium, and protein.  Medicines: ? To lower blood pressure. ? To control blood glucose. ? To relieve anemia. ? To relieve swelling. ? To protect your bones. ? To improve the balance of electrolytes in your blood.  Removing toxic waste from the body through types of  dialysis, if the kidneys can no longer do their job (kidney failure).  Managing any other conditions that are causing your CKD or making it worse. Follow these instructions at home: Medicines  Take over-the-counter and prescription medicines only as told by your health care provider. The dose of some medicines that you take may need to be adjusted.  Do not take any new medicines unless approved by your health care provider. Many medicines can worsen your kidney damage.  Do not take any vitamin and mineral supplements unless approved by your health care provider. Many nutritional supplements can worsen your kidney damage. General instructions  Follow your prescribed diet as told by your health care provider.  Do not use any products that contain nicotine or tobacco, such as cigarettes and e-cigarettes. If you need help quitting, ask your health care provider.  Monitor and track your blood pressure at home. Report changes in your blood pressure as told by your health care provider.  If you are being treated for diabetes, monitor and track your blood sugar (blood glucose) levels as told by your health care provider.  Maintain a healthy weight. If you need help with this, ask your health care provider.  Start or continue an exercise plan. Exercise at least 30 minutes a day, 5 days a week.  Keep your immunizations up to date as told by your health care provider.  Keep all follow-up visits as told by your health care provider. This is important. Where to find more information  American Association of Kidney Patients: BombTimer.gl  National Kidney Foundation: www.kidney.[place]: https://mathis.com/  [facility]: www.lifeoptions.org and www.kidneyschool.org Contact a health care provider if:  Your symptoms get worse.  You develop new symptoms. Get help right away if:  You develop symptoms of ESRD, which include: ? Headaches. ? Numbness in the  hands or feet. ? Easy bruising. ? Frequent hiccups. ? Chest pain. ? Shortness of breath. ? Lack of menstruation, in women.  You have a fever.  You have decreased urine production.  You have pain or bleeding when you urinate. Summary  Chronic kidney disease (CKD) occurs when the kidneys become damaged slowly over a long period of time.  The most common causes of this condition are diabetes and high blood pressure (hypertension).  There is no cure for most cases of this condition, but treatment usually relieves symptoms and prevents or slows the progression of the disease. Treatment may include a combination of medicines and lifestyle changes. This information is not intended to replace advice given to you by your health care provider. Make sure you discuss any questions you have with your health care provider. Document Released: 07/24/2008 Document Revised: 11/22/2016 Document Reviewed: 11/22/2016 Elsevier Interactive Patient Education  2019 Elsevier Inc.  Diabetic Nephropathy  Diabetic nephropathy is kidney disease that is caused by diabetes (diabetes mellitus). Kidneys are organs that filter and clean blood and get rid of body waste products and extra fluid. Diabetes can cause gradual kidney damage over many years. Diabetic nephropathy that continues to get worse (progress) can lead to kidney failure. What are the causes? This condition is caused by kidney damage from  diabetes that is not well controlled with treatment. Having high blood sugar (glucose) for a long time can damage blood vessels in the kidneys and cause them to thicken and become scarred. Those changes prevent the kidneys from functioning normally. What increases the risk? This condition is more likely to develop in people with diabetes who:  Have had diabetes for many years.  Have high blood pressure.  Have high blood glucose levels over a long period of time.  Have a family history of kidney disease.  Have a  history of tobacco use.  Have certain genes that are passed from parent to child (inherited).  Are of African-American, Hispanic, Native American, Asian, or [place] descent. What are the signs or symptoms? This condition may not cause symptoms at first. If you do have symptoms, they may include:  Swelling of your hands, feet, or ankles.  Weakness.  Poor appetite.  Nausea.  Confusion.  Fatigue.  Trouble sleeping.  Dry, itchy skin. If nephropathy leads to kidney failure, symptoms may include:  Vomiting.  Shortness of breath.  Jerky movements that you cannot control (seizure).  Coma. How is this diagnosed? It is important to diagnose this condition before symptoms develop. You may be screened for diabetic nephropathy at a routine health care visit. Screening tests may include:  Yearly (annual) urine tests.  Urine collection over a 24-hour period to measure kidney function.  Blood tests to measure blood glucose levels and kidney function.  Regular blood pressure monitoring. If your health care provider suspects diabetic nephropathy, he or she may:  Review your medical history and symptoms.  Do a physical exam.  Do an ultrasound of your kidneys.  Perform a procedure to take a sample of kidney tissue for testing (biopsy). How is this treated? The goal of treatment is to prevent or slow down any damage to your kidneys by managing your diabetes. To do this, it is important to control:  Your blood pressure. ? Your target blood pressure may vary depending on your medical conditions, your age, and other factors. ? To help control blood pressure, you may be prescribed medicines to lower your blood pressure (ACE inhibitors) or to help your body get rid of excess fluid (diuretics).  Your A1c (hemoglobin A1c) level. Generally, the goal of treatment is to maintain an A1c level of less than 7%.  Your blood glucose level.  Your blood lipids. If you have high  cholesterol, you may need to take lipid-lowering drugs, such as statins. Other treatments may include:  Medicines, including insulin injections.  Lifestyle changes, such as: ? Losing weight. ? Quitting smoking (smoking cessation).  Changes to your diet, which may include limiting your salt (sodium), protein, and fluid intake. If your disease progresses to end-stage kidney failure, treatment may include:  Dialysis. This is a procedure to filter your blood with a machine.  Kidney transplant. Follow these instructions at home: Lifestyle  Maintain a healthy weight. Work with your health care provider to lose weight, if needed.  Do not use any products that contain nicotine or tobacco, such as cigarettes and e-cigarettes. If you need help quitting, ask your health care provider.  Be physically active every day. Ask your health care provider what type of exercise is best for you.  Eat healthy foods, and eat healthy snacks between meals. Follow instructions from your health care provider about eating and drinking restrictions.  Limit your sodium, protein, or fluid intake as directed.  Work with your health care provider to  manage your blood pressure. General instructions      Follow your diabetes management plan as directed. ? Check your blood glucose levels as directed by your health care provider. ? Keep your blood glucose in your target range as directed by your health care provider. ? Have your A1c level checked two or more times a year, or as often as told by your health care provider.  Measure your blood pressure regularly at home, as told by your health care provider.  Take over-the-counter and prescription medicines only as told by your health care provider. These include insulin and supplements.  Keep all follow-up visits and routine visits as told by your health care provider. This is important. Make sure to get screening tests as directed. Contact a health care  provider if:  You have trouble keeping your blood glucose in your goal range.  Your blood glucose level is higher than 240 mg/dL (13.3 mmol/L) for 2 days in a row.  You have swelling in your hands, ankles, or feet.  You feel weak, tired, or dizzy.  You have muscle tightening that you cannot control (spasms).  You have nausea or vomiting.  You feel tired all the time. Get help right away if:  You are very sleepy.  You faint.  You have: ? A seizure. ? Severe, painful muscle spasms. ? Shortness of breath. ? Chest pain. Summary  Keep your blood glucose in your target range as directed by your health care provider.  Work with your health care provider to manage your blood pressure.  Keep all follow-up visits and routine visits as told by your health care provider. This is important. Make sure to get screening tests as directed. This information is not intended to replace advice given to you by your health care provider. Make sure you discuss any questions you have with your health care provider. Document Released: 11/04/2007 Document Revised: 06/04/2017 Document Reviewed: 09/12/2016 Elsevier Interactive Patient Education  2019 [place].  Gastrointestinal Bleeding Gastrointestinal (GI) bleeding is bleeding somewhere along the digestive tract, between the mouth and anus. This can be caused by various problems. The severity of these problems can range from mild to serious or even life-threatening. If you have GI bleeding, you may find blood in your stools (feces), you may have black stools, or you may vomit blood. If there is a lot of bleeding, you may need to stay in the hospital. What are the causes? This condition may be caused by:  Esophagitis. This is inflammation, irritation, or swelling of the esophagus.  Hemorrhoids.These are swollen veins in the rectum.  Anal fissures.These are areas of painful tearing that are often caused by passing hard  stool.  Diverticulosis.These are pouches that form on the colon over time, with age, and may bleed a lot.  Diverticulitis.This is inflammation in areas with diverticulosis. It can cause pain, fever, and bloody stools, although bleeding may be mild.  Polyps and cancer. Colon cancer often starts out as precancerous polyps.  Gastritis and ulcers. With these, bleeding may come from the upper GI tract, near the stomach. What are the signs or symptoms? Symptoms of this condition may include:  Bright red blood in your vomit, or vomit that looks like coffee grounds.  Bloody, black, or tarry stools. ? Bleeding from the lower GI tract will usually cause red or maroon blood in the stools. ? Bleeding from the upper GI tract may cause black, tarry, often bad-smelling stools. ? In certain cases, if the bleeding is  fast enough, the stools may be red.  Pain or cramping in the abdomen. How is this diagnosed? This condition may be diagnosed based on:  Medical history and physical exam.  Various tests, such as: ? Blood tests. ? X-rays and other imaging tests. ? Esophagogastroduodenoscopy (EGD). In this test, a flexible, lighted tube is used to look at your esophagus, stomach, and small intestine. ? Colonoscopy. In this test, a flexible, lighted tube is used to look at your colon. How is this treated? Treatment for this condition depends on the cause of the bleeding. For example:  For bleeding from the esophagus, stomach, small intestine, or colon, the health care provider doing your EGD or colonoscopy may be able to stop the bleeding as part of the procedure.  Inflammation or infection of the colon can be treated with medicines.  Certain rectal problems can be treated with creams, suppositories, or warm baths.  Surgery is sometimes needed.  Blood transfusions are sometimes needed if a lot of blood has been lost. If bleeding is slow, you may be allowed to go home. If there is a lot of  bleeding, you will need to stay in the hospital for observation. Follow these instructions at home:   Take over-the-counter and prescription medicines only as told by your health care provider.  Eat foods that are high in fiber. This will help to keep your stools soft. These foods include whole grains, legumes, fruits, and vegetables. Eating 1-3 prunes each day works well for many people.  Drink enough fluid to keep your urine clear or pale yellow.  Keep all follow-up visits as told by your health care provider. This is important. Contact a health care provider if:  Your symptoms do not improve. Get help right away if:  Your bleeding increases.  You feel light-headed or you faint.  You feel weak.  You have severe cramps in your back or abdomen.  You pass large blood clots in your stool.  Your symptoms are getting worse. This information is not intended to replace advice given to you by your health care provider. Make sure you discuss any questions you have with your health care provider. Document Released: 10/12/2000 Document Revised: 03/14/2016 Document Reviewed: 04/04/2015 Elsevier Interactive Patient Education  2019 [employer].

## 2018-12-09 NOTE — Progress Notes (Signed)
Subjective:    Patient ID: [facility], male    DOB: [birth year], 68 y.o.   MRN: [id number]  No chief complaint on file.   HPI  Pt is a 68 yo male with pmh sig for CKD 3, DM II with neuropathy, CHF, HTN, anemia, HLD, CAD, h/o NSTEMI.    Patient was seen today for Hospital f/u.  Pt seen in ED on 1/28/202 for pneumonia, however declined admission, given levaquin.    Pt admitted 2/3-12/04/2018 after syncopal episode thought vasaovagal.  Pt noted to have melena, H/H 8/25.5, transfused 1 U pRBCs.  GI consulted, thought anemia of chronic dz.  EGD suggested, however pt would need to be off Brilinta x 5 days and procedure to be done in hospital GI lab given h/o NSTEMI in Dec 2019.  Barium study suggested possible mechanical lesion in proximal esophagus.  Feraheme infusion scheduled outpt.  Since d/c pt has been doing ok.  He endorses neuropathy in feet, occasional cough, but notes improvement.   Melena has resolved.  Pt's daughter trying to make sure HH is in place as she has to leave town in a few days.  GI appointment is schedule for Thursday. Pt thought he was suppose to stop Brilinta, pt's daughter still giving him the med.  Of note: while pt was admitted to the hospital, his daughter was also admitted for pneumonia.  Pt also had f/u with pulm for hemoptysis.  Per pt "told my lungs are fine".  Past Medical History:  Diagnosis Date  . Anemia   . CKD (chronic kidney disease) stage 3, GFR 30-59 ml/min (HCC)   . Coronary artery disease   . Diabetic peripheral neuropathy ([place])   . GERD (gastroesophageal reflux disease)   . Hyperlipidemia   . Hypertension   . Hypertensive crisis 10/16/2018  . Noncompliance with medication regimen   . Osteoarthritis    "legs, back" (10/16/2018)  . Pneumonia Feb 17, 2016   "real bad; I died and they had to bring me back" (10/16/2018)  . Seasonal allergies   . Type II diabetes mellitus (HCC)     No Known Allergies  ROS General: Denies fever, chills, night  sweats, changes in weight, changes in appetite HEENT: Denies headaches, ear pain, changes in vision, rhinorrhea, sore throat CV: Denies CP, palpitations, SOB, orthopnea Pulm: Denies SOB, cough, wheezing GI: Denies abdominal pain, nausea, vomiting, diarrhea, constipation GU: Denies dysuria, hematuria, frequency, vaginal discharge Msk: Denies muscle cramps    +back pain, LE pain Neuro: Denies weakness  + numbness, tingling   Skin: Denies rashes, bruising Psych: Denies depression, anxiety, hallucinations     Objective:    Blood pressure (!) 158/62, pulse 92, temperature 99.8 F (37.7 C), temperature source Oral, weight 204 lb (92.5 kg), SpO2 94 %.   Gen. Pleasant, well-nourished, in no distress, normal affect  HEENT: [place]/AT, face symmetric, conjunctiva clear, no scleral icterus, PERRLA, EOMI, nares patent without drainage, pharynx without erythema or exudate. Lungs: no accessory muscle use, CTAB, no wheezes or rales Cardiovascular: RRR, no m/r/g, no peripheral edema Abdomen: BS present, soft, NT/ND, no hepatosplenomegaly. Musculoskeletal: No deformities, no cyanosis or clubbing, normal tone Neuro:  A&Ox3, CN II-XII intact, normal gait Skin:  Warm, no lesions/ rash   Wt Readings from Last 3 Encounters:  12/09/18 204 lb (92.5 kg)  12/02/18 197 lb 12 oz (89.7 kg)  11/18/18 214 lb (97.1 kg)    Lab Results  Component Value Date   WBC 6.5 12/04/2018   HGB 8.9 (L)  12/04/2018   HCT 27.5 (L) 12/04/2018   PLT 282 12/04/2018   GLUCOSE 232 (H) 12/04/2018   CHOL 162 03/06/2018   TRIG 95.0 03/06/2018   HDL 37.60 (L) 03/06/2018   LDLCALC 105 (H) 03/06/2018   ALT 11 10/16/2018   AST 16 10/16/2018   NA 137 12/04/2018   K 4.2 12/04/2018   CL 103 12/04/2018   CREATININE 2.19 (H) 12/04/2018   BUN 23 12/04/2018   CO2 23 12/04/2018   INR 1.07 11/19/2017   HGBA1C 6.6 (H) 10/21/2018    Assessment/Plan:  Anemia of chronic disease  -s/p 1 u pRBCs. -melena resolved.   -f/u with GI this  wk.  Will need EGD in hospital lab but should stop brilinta 5 days prior to procedure.   -feraheme infusion scheduled for this wk, pt encouraged to keep this appt -given RTC or ED precautions. - Plan: CBC with Differential/Platelet  Essential hypertension -elevated. -continue current meds.   -would benefit from increase in meds for optimum bp control.  Consider increasing Metoprolol tartrate 75 mg to BID instead of daily dosing as noted at time of d/c. -HH to check bp and manage meds -Discussed the importance of compliance  Type 2 diabetes mellitus with stage 3 chronic kidney disease, with long-term current use of insulin (HCC) -continue gabapentin TID,  tresiba 5 units -discussed the importance of glycemic control and lifestyle modifications. -continue f/u with Endocrinology  F/u in the next 1-2 months  Francella Solian, MD

## 2018-12-09 NOTE — Telephone Encounter (Signed)
Pt seen in the office this afternoon

## 2018-12-10 ENCOUNTER — Emergency Department (HOSPITAL_COMMUNITY): Payer: BLUE CROSS/BLUE SHIELD

## 2018-12-10 ENCOUNTER — Encounter: Payer: Self-pay | Admitting: Family Medicine

## 2018-12-10 ENCOUNTER — Other Ambulatory Visit: Payer: Self-pay

## 2018-12-10 ENCOUNTER — Inpatient Hospital Stay (HOSPITAL_COMMUNITY)
Admission: EM | Admit: 2018-12-10 | Discharge: 2018-12-12 | DRG: 193 | Disposition: A | Discharge status: Home Health | Payer: BLUE CROSS/BLUE SHIELD | Attending: Internal Medicine | Admitting: Internal Medicine

## 2018-12-10 ENCOUNTER — Ambulatory Visit (HOSPITAL_COMMUNITY)
Admission: RE | Admit: 2018-12-10 | Discharge: 2018-12-10 | Disposition: A | Discharge status: Home / Self Care | Payer: BLUE CROSS/BLUE SHIELD | Source: Ambulatory Visit | Attending: Nephrology | Admitting: Nephrology

## 2018-12-10 VITALS — BP 147/65 | HR 107 | Temp 101.2°F | Resp 20 | Ht 75.0 in | Wt 205.0 lb

## 2018-12-10 DIAGNOSIS — K219 Gastro-esophageal reflux disease without esophagitis: Secondary | ICD-10-CM | POA: Diagnosis present

## 2018-12-10 DIAGNOSIS — D631 Anemia in chronic kidney disease: Secondary | ICD-10-CM | POA: Diagnosis present

## 2018-12-10 DIAGNOSIS — E440 Moderate protein-calorie malnutrition: Secondary | ICD-10-CM | POA: Diagnosis present

## 2018-12-10 DIAGNOSIS — Z79899 Other long term (current) drug therapy: Secondary | ICD-10-CM

## 2018-12-10 DIAGNOSIS — J189 Pneumonia, unspecified organism: Secondary | ICD-10-CM | POA: Diagnosis present

## 2018-12-10 DIAGNOSIS — I2510 Atherosclerotic heart disease of native coronary artery without angina pectoris: Secondary | ICD-10-CM | POA: Diagnosis present

## 2018-12-10 DIAGNOSIS — Y95 Nosocomial condition: Secondary | ICD-10-CM | POA: Diagnosis present

## 2018-12-10 DIAGNOSIS — E875 Hyperkalemia: Secondary | ICD-10-CM | POA: Diagnosis present

## 2018-12-10 DIAGNOSIS — N1831 Chronic kidney disease, stage 3a: Secondary | ICD-10-CM | POA: Diagnosis present

## 2018-12-10 DIAGNOSIS — J9621 Acute and chronic respiratory failure with hypoxia: Secondary | ICD-10-CM | POA: Diagnosis present

## 2018-12-10 DIAGNOSIS — M1991 Primary osteoarthritis, unspecified site: Secondary | ICD-10-CM | POA: Diagnosis present

## 2018-12-10 DIAGNOSIS — E43 Unspecified severe protein-calorie malnutrition: Secondary | ICD-10-CM | POA: Diagnosis present

## 2018-12-10 DIAGNOSIS — M479 Spondylosis, unspecified: Secondary | ICD-10-CM | POA: Diagnosis present

## 2018-12-10 DIAGNOSIS — I130 Hypertensive heart and chronic kidney disease with heart failure and stage 1 through stage 4 chronic kidney disease, or unspecified chronic kidney disease: Secondary | ICD-10-CM | POA: Diagnosis present

## 2018-12-10 DIAGNOSIS — E1122 Type 2 diabetes mellitus with diabetic chronic kidney disease: Secondary | ICD-10-CM | POA: Diagnosis present

## 2018-12-10 DIAGNOSIS — E861 Hypovolemia: Secondary | ICD-10-CM | POA: Diagnosis present

## 2018-12-10 DIAGNOSIS — I10 Essential (primary) hypertension: Secondary | ICD-10-CM | POA: Diagnosis not present

## 2018-12-10 DIAGNOSIS — Z8679 Personal history of other diseases of the circulatory system: Secondary | ICD-10-CM

## 2018-12-10 DIAGNOSIS — E1142 Type 2 diabetes mellitus with diabetic polyneuropathy: Secondary | ICD-10-CM | POA: Diagnosis present

## 2018-12-10 DIAGNOSIS — E1165 Type 2 diabetes mellitus with hyperglycemia: Secondary | ICD-10-CM | POA: Diagnosis present

## 2018-12-10 DIAGNOSIS — D649 Anemia, unspecified: Secondary | ICD-10-CM | POA: Diagnosis not present

## 2018-12-10 DIAGNOSIS — N183 Chronic kidney disease, stage 3 unspecified: Secondary | ICD-10-CM

## 2018-12-10 DIAGNOSIS — Z8701 Personal history of pneumonia (recurrent): Secondary | ICD-10-CM

## 2018-12-10 DIAGNOSIS — Z7982 Long term (current) use of aspirin: Secondary | ICD-10-CM

## 2018-12-10 DIAGNOSIS — I214 Non-ST elevation (NSTEMI) myocardial infarction: Secondary | ICD-10-CM | POA: Diagnosis not present

## 2018-12-10 DIAGNOSIS — E785 Hyperlipidemia, unspecified: Secondary | ICD-10-CM | POA: Diagnosis present

## 2018-12-10 DIAGNOSIS — J9601 Acute respiratory failure with hypoxia: Secondary | ICD-10-CM | POA: Diagnosis present

## 2018-12-10 DIAGNOSIS — Z87891 Personal history of nicotine dependence: Secondary | ICD-10-CM

## 2018-12-10 DIAGNOSIS — Z6824 Body mass index (BMI) 24.0-24.9, adult: Secondary | ICD-10-CM

## 2018-12-10 DIAGNOSIS — Z9114 Patient's other noncompliance with medication regimen: Secondary | ICD-10-CM | POA: Diagnosis not present

## 2018-12-10 DIAGNOSIS — Z8249 Family history of ischemic heart disease and other diseases of the circulatory system: Secondary | ICD-10-CM

## 2018-12-10 DIAGNOSIS — I509 Heart failure, unspecified: Secondary | ICD-10-CM | POA: Diagnosis present

## 2018-12-10 DIAGNOSIS — R042 Hemoptysis: Secondary | ICD-10-CM

## 2018-12-10 DIAGNOSIS — Z833 Family history of diabetes mellitus: Secondary | ICD-10-CM

## 2018-12-10 DIAGNOSIS — E119 Type 2 diabetes mellitus without complications: Secondary | ICD-10-CM

## 2018-12-10 DIAGNOSIS — Z8349 Family history of other endocrine, nutritional and metabolic diseases: Secondary | ICD-10-CM

## 2018-12-10 DIAGNOSIS — J302 Other seasonal allergic rhinitis: Secondary | ICD-10-CM | POA: Diagnosis present

## 2018-12-10 DIAGNOSIS — I252 Old myocardial infarction: Secondary | ICD-10-CM

## 2018-12-10 DIAGNOSIS — R0902 Hypoxemia: Secondary | ICD-10-CM

## 2018-12-10 DIAGNOSIS — N189 Chronic kidney disease, unspecified: Secondary | ICD-10-CM | POA: Diagnosis not present

## 2018-12-10 DIAGNOSIS — Z7902 Long term (current) use of antithrombotics/antiplatelets: Secondary | ICD-10-CM

## 2018-12-10 DIAGNOSIS — R509 Fever, unspecified: Secondary | ICD-10-CM

## 2018-12-10 DIAGNOSIS — Z794 Long term (current) use of insulin: Secondary | ICD-10-CM

## 2018-12-10 DIAGNOSIS — A419 Sepsis, unspecified organism: Secondary | ICD-10-CM

## 2018-12-10 LAB — CBC WITH DIFFERENTIAL/PLATELET
Abs Immature Granulocytes: 0.05 10*3/uL (ref 0.00–0.07)
Basophils Absolute: 0 10*3/uL (ref 0.0–0.1)
Basophils Relative: 0 %
Eosinophils Absolute: 0.1 10*3/uL (ref 0.0–0.5)
Eosinophils Relative: 1 %
HCT: 26 % — ABNORMAL LOW (ref 39.0–52.0)
Hemoglobin: 7.9 g/dL — ABNORMAL LOW (ref 13.0–17.0)
Immature Granulocytes: 0 %
Lymphocytes Relative: 5 %
Lymphs Abs: 0.6 10*3/uL — ABNORMAL LOW (ref 0.7–4.0)
MCH: 27.4 pg (ref 26.0–34.0)
MCHC: 30.4 g/dL (ref 30.0–36.0)
MCV: 90.3 fL (ref 80.0–100.0)
Monocytes Absolute: 0.6 10*3/uL (ref 0.1–1.0)
Monocytes Relative: 4 %
Neutro Abs: 11.9 10*3/uL — ABNORMAL HIGH (ref 1.7–7.7)
Neutrophils Relative %: 90 %
Platelets: 258 10*3/uL (ref 150–400)
RBC: 2.88 MIL/uL — ABNORMAL LOW (ref 4.22–5.81)
RDW: 13.6 % (ref 11.5–15.5)
WBC: 13.3 10*3/uL — ABNORMAL HIGH (ref 4.0–10.5)
nRBC: 0 % (ref 0.0–0.2)

## 2018-12-10 LAB — COMPREHENSIVE METABOLIC PANEL
ALT: 10 U/L (ref 0–44)
AST: 16 U/L (ref 15–41)
Albumin: 2 g/dL — ABNORMAL LOW (ref 3.5–5.0)
Alkaline Phosphatase: 76 U/L (ref 38–126)
Anion gap: 13 (ref 5–15)
BUN: 33 mg/dL — ABNORMAL HIGH (ref 8–23)
CO2: 21 mmol/L — ABNORMAL LOW (ref 22–32)
Calcium: 8 mg/dL — ABNORMAL LOW (ref 8.9–10.3)
Chloride: 105 mmol/L (ref 98–111)
Creatinine, Ser: 2.33 mg/dL — ABNORMAL HIGH (ref 0.61–1.24)
GFR calc Af Amer: 32 mL/min — ABNORMAL LOW (ref >60)
GFR calc non Af Amer: 27 mL/min — ABNORMAL LOW (ref >60)
Glucose, Bld: 177 mg/dL — ABNORMAL HIGH (ref 70–99)
Potassium: 5.1 mmol/L (ref 3.5–5.1)
Sodium: 139 mmol/L (ref 135–145)
Total Bilirubin: 0.8 mg/dL (ref 0.3–1.2)
Total Protein: 5.9 g/dL — ABNORMAL LOW (ref 6.5–8.1)

## 2018-12-10 LAB — CBG MONITORING, ED: Glucose-Capillary: 151 mg/dL — ABNORMAL HIGH (ref 70–99)

## 2018-12-10 LAB — URINALYSIS, ROUTINE W REFLEX MICROSCOPIC
Bilirubin Urine: NEGATIVE
Glucose, UA: 50 mg/dL — AB
Hgb urine dipstick: NEGATIVE
Ketones, ur: NEGATIVE mg/dL
Leukocytes,Ua: NEGATIVE
Nitrite: NEGATIVE
Protein, ur: 100 mg/dL — AB
Specific Gravity, Urine: 1.013 (ref 1.005–1.030)
pH: 5 (ref 5.0–8.0)

## 2018-12-10 LAB — LIPASE, BLOOD: Lipase: 22 U/L (ref 11–51)

## 2018-12-10 LAB — INFLUENZA PANEL BY PCR (TYPE A & B)
Influenza A By PCR: NEGATIVE
Influenza B By PCR: NEGATIVE

## 2018-12-10 LAB — TROPONIN I: Troponin I: 0.04 ng/mL (ref <0.03)

## 2018-12-10 LAB — LACTIC ACID, PLASMA: Lactic Acid, Venous: 0.7 mmol/L (ref 0.5–1.9)

## 2018-12-10 LAB — GLUCOSE, CAPILLARY: Glucose-Capillary: 190 mg/dL — ABNORMAL HIGH (ref 70–99)

## 2018-12-10 LAB — PREPARE RBC (CROSSMATCH)

## 2018-12-10 MED ORDER — SODIUM CHLORIDE 0.9 % IV SOLN
510.0000 mg | Freq: Once | INTRAVENOUS | Status: AC
  Administered 2018-12-11: 510 mg via INTRAVENOUS
  Filled 2018-12-10: qty 17

## 2018-12-10 MED ORDER — ACETAMINOPHEN 325 MG PO TABS
650.0000 mg | ORAL_TABLET | Freq: Four times a day (QID) | ORAL | Status: DC | PRN
  Administered 2018-12-11: 650 mg via ORAL
  Filled 2018-12-10: qty 2

## 2018-12-10 MED ORDER — INSULIN ASPART 100 UNIT/ML ~~LOC~~ SOLN
0.0000 [IU] | Freq: Three times a day (TID) | SUBCUTANEOUS | Status: DC
  Administered 2018-12-11: 2 [IU] via SUBCUTANEOUS

## 2018-12-10 MED ORDER — LINACLOTIDE 145 MCG PO CAPS: 145.0000 ug | ORAL_CAPSULE | Freq: Every day | ORAL | Status: DC | PRN

## 2018-12-10 MED ORDER — TICAGRELOR 90 MG PO TABS
90.0000 mg | ORAL_TABLET | Freq: Two times a day (BID) | ORAL | Status: DC
  Filled 2018-12-10 (×2): qty 1

## 2018-12-10 MED ORDER — SODIUM CHLORIDE 0.9 % IV SOLN: 510.0000 mg | Freq: Once | INTRAVENOUS | Status: DC

## 2018-12-10 MED ORDER — VANCOMYCIN HCL 10 G IV SOLR: 1750.0000 mg | INTRAVENOUS | Status: DC

## 2018-12-10 MED ORDER — SODIUM CHLORIDE 0.9 % IV SOLN
INTRAVENOUS | Status: DC
  Administered 2018-12-10: 23:00:00 via INTRAVENOUS

## 2018-12-10 MED ORDER — ENSURE ENLIVE PO LIQD: 237.0000 mL | Freq: Two times a day (BID) | ORAL | Status: DC

## 2018-12-10 MED ORDER — EPOETIN ALFA-EPBX 10000 UNIT/ML IJ SOLN
20000.0000 [IU] | INTRAMUSCULAR | Status: DC
  Filled 2018-12-10: qty 2

## 2018-12-10 MED ORDER — ISOSORBIDE MONONITRATE ER 30 MG PO TB24
30.0000 mg | ORAL_TABLET | Freq: Every day | ORAL | Status: DC
  Administered 2018-12-11 – 2018-12-12 (×2): 30 mg via ORAL
  Filled 2018-12-10 (×2): qty 1

## 2018-12-10 MED ORDER — INSULIN ASPART 100 UNIT/ML ~~LOC~~ SOLN: 0.0000 [IU] | Freq: Every day | SUBCUTANEOUS | Status: DC

## 2018-12-10 MED ORDER — ROSUVASTATIN CALCIUM 20 MG PO TABS
20.0000 mg | ORAL_TABLET | Freq: Every day | ORAL | Status: DC
  Administered 2018-12-11: 20 mg via ORAL
  Filled 2018-12-10: qty 1

## 2018-12-10 MED ORDER — ACETAMINOPHEN 650 MG RE SUPP: 650.0000 mg | Freq: Four times a day (QID) | RECTAL | Status: DC | PRN

## 2018-12-10 MED ORDER — ONDANSETRON HCL 4 MG/2ML IJ SOLN: 4.0000 mg | Freq: Four times a day (QID) | INTRAMUSCULAR | Status: DC | PRN

## 2018-12-10 MED ORDER — SODIUM CHLORIDE 0.9 % IV SOLN
2.0000 g | INTRAVENOUS | Status: DC
  Administered 2018-12-10: 2 g via INTRAVENOUS
  Filled 2018-12-10: qty 2

## 2018-12-10 MED ORDER — METOPROLOL TARTRATE 25 MG PO TABS
75.0000 mg | ORAL_TABLET | Freq: Every day | ORAL | Status: DC
  Administered 2018-12-11 – 2018-12-12 (×2): 75 mg via ORAL
  Filled 2018-12-10 (×2): qty 3

## 2018-12-10 MED ORDER — AMLODIPINE BESYLATE 10 MG PO TABS
10.0000 mg | ORAL_TABLET | Freq: Every day | ORAL | Status: DC
  Administered 2018-12-11 – 2018-12-12 (×2): 10 mg via ORAL
  Filled 2018-12-10 (×2): qty 1

## 2018-12-10 MED ORDER — SODIUM CHLORIDE 0.9% FLUSH
3.0000 mL | Freq: Two times a day (BID) | INTRAVENOUS | Status: DC
  Administered 2018-12-10 – 2018-12-12 (×4): 3 mL via INTRAVENOUS

## 2018-12-10 MED ORDER — PANTOPRAZOLE SODIUM 40 MG PO TBEC
40.0000 mg | DELAYED_RELEASE_TABLET | Freq: Every day | ORAL | Status: DC
  Administered 2018-12-11 – 2018-12-12 (×2): 40 mg via ORAL
  Filled 2018-12-10 (×2): qty 1

## 2018-12-10 MED ORDER — VANCOMYCIN HCL 10 G IV SOLR
2000.0000 mg | Freq: Once | INTRAVENOUS | Status: AC
  Administered 2018-12-10: 2000 mg via INTRAVENOUS
  Filled 2018-12-10: qty 2000

## 2018-12-10 MED ORDER — SODIUM CHLORIDE 0.9 % IV SOLN: 1.0000 g | Freq: Three times a day (TID) | INTRAVENOUS | Status: DC

## 2018-12-10 MED ORDER — GABAPENTIN 600 MG PO TABS
1200.0000 mg | ORAL_TABLET | Freq: Three times a day (TID) | ORAL | Status: DC
  Administered 2018-12-10 – 2018-12-12 (×5): 1200 mg via ORAL
  Filled 2018-12-10 (×5): qty 2

## 2018-12-10 MED ORDER — ASPIRIN EC 81 MG PO TBEC
81.0000 mg | DELAYED_RELEASE_TABLET | Freq: Every day | ORAL | Status: DC
  Administered 2018-12-11 – 2018-12-12 (×2): 81 mg via ORAL
  Filled 2018-12-10 (×2): qty 1

## 2018-12-10 MED ORDER — METHOCARBAMOL 500 MG PO TABS: 500.0000 mg | ORAL_TABLET | Freq: Two times a day (BID) | ORAL | Status: DC | PRN

## 2018-12-10 MED ORDER — SODIUM CHLORIDE 0.9% IV SOLUTION
Freq: Once | INTRAVENOUS | Status: AC
  Administered 2018-12-10: 21:00:00 via INTRAVENOUS

## 2018-12-10 MED ORDER — SODIUM CHLORIDE 0.9 % IV SOLN: 250.0000 mL | INTRAVENOUS | Status: DC | PRN

## 2018-12-10 MED ORDER — SODIUM CHLORIDE 0.9 % IV SOLN
510.0000 mg | Freq: Once | INTRAVENOUS | Status: DC
  Filled 2018-12-10: qty 17

## 2018-12-10 MED ORDER — POLYETHYLENE GLYCOL 3350 17 G PO PACK: 17.0000 g | PACK | Freq: Every day | ORAL | Status: DC | PRN

## 2018-12-10 MED ORDER — HYDRALAZINE HCL 50 MG PO TABS
100.0000 mg | ORAL_TABLET | Freq: Three times a day (TID) | ORAL | Status: DC
  Administered 2018-12-10 – 2018-12-12 (×6): 100 mg via ORAL
  Filled 2018-12-10 (×13): qty 2

## 2018-12-10 MED ORDER — SODIUM CHLORIDE 0.9% FLUSH: 3.0000 mL | INTRAVENOUS | Status: DC | PRN

## 2018-12-10 MED ORDER — ONDANSETRON HCL 4 MG PO TABS: 4.0000 mg | ORAL_TABLET | Freq: Four times a day (QID) | ORAL | Status: DC | PRN

## 2018-12-10 MED ORDER — ACETAMINOPHEN 325 MG PO TABS
650.0000 mg | ORAL_TABLET | Freq: Once | ORAL | Status: AC
  Administered 2018-12-10: 650 mg via ORAL
  Filled 2018-12-10: qty 2

## 2018-12-10 NOTE — ED Notes (Signed)
SPO2 dropped to 88% with ambulation, MD Melina Copa notified.

## 2018-12-10 NOTE — ED Notes (Signed)
Pt aware that a urine sample is needed and will provide one when able. 

## 2018-12-10 NOTE — ED Notes (Signed)
This RN unable to start an IV as veins blew three times, pt requesting to wait for IV team for further blood draws/IV's.  This RN unable to get a lactic acid or second set of blood cultures.

## 2018-12-10 NOTE — ED Notes (Signed)
Patient transported to X-ray 

## 2018-12-10 NOTE — ED Notes (Signed)
Pt given water 

## 2018-12-10 NOTE — ED Notes (Signed)
Pt placed on 2L [place] for sats of 88%.   

## 2018-12-10 NOTE — Progress Notes (Signed)
Pt presented to short stay with fever 101.2, sats 88% on room air at arrival however after resting 10 minutes sats 95% RA, HR slightly tachy 108, pt stated he had been in the hospital last week with pneumonia and flu and is coughing up red phlegm.  Reported the above to [facility] who stated to hold treatments today and send him to the ER per Dr Hollie Salk.  I spoke with pt and daughter and let her know Dr Hollie Salk wants him to be seen again in the ER and they verbalized understanding.  Took pt to the ER in a wheelchair.

## 2018-12-10 NOTE — ED Triage Notes (Signed)
[facility] was [facility] there to have iron infusion and per tech from [facility] pt has fever so was unable to do infusion and brought to ED. Family reports pt is currently on antibiotics and has diagnosed pneumonia. Pt denies any sob or chest pain- still has a productive cough. Pt is in no acute distress.

## 2018-12-10 NOTE — Telephone Encounter (Signed)
Called pt daughter left a message per Dr Volanda Napoleon for pt to wait for the cardiology appointment to get advise on if pt needs referral to Cardiac rehab as requested by pt daughter

## 2018-12-10 NOTE — ED Notes (Signed)
IV team at bedside 
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
## 2018-12-10 NOTE — Progress Notes (Signed)
Pharmacy Antibiotic Note  Fabion Gatson is a 68 y.o. male admitted on 12/10/2018 with pneumonia.  Pharmacy has been consulted for vancomycin dosing.  CKD, seems to be near baseline.  Plan: Start cefepime 2g IV Q24h Give vancomycin 2g IV x 1, then start vancomycin 1,750mg  IV Q36h Monitor clinical picture, renal function, vanc levels prn F/U C&S, abx deescalation / LOT    Temp (24hrs), Avg:100.3 F (37.9 C), Min:98.5 F (36.9 C), Max:101.4 F (38.6 C)  Recent Labs  Lab 12/04/18 0123 12/09/18 1519 12/10/18 1134 12/10/18 1157  WBC 6.5 13.6*  --  13.3*  CREATININE 2.19*  --   --  2.33*  LATICACIDVEN  --   --  0.7  --     Estimated Creatinine Clearance: 35.8 mL/min (A) (by C-G formula based on SCr of 2.33 mg/dL (H)).    No Known Allergies   Thank you for allowing pharmacy to be a part of this patient's care.  Reginia Naas 12/10/2018 4:59 PM

## 2018-12-10 NOTE — ED Notes (Signed)
Heart healthy dinner tray ordered 

## 2018-12-11 ENCOUNTER — Ambulatory Visit: Payer: BLUE CROSS/BLUE SHIELD | Admitting: Nurse Practitioner

## 2018-12-11 ENCOUNTER — Encounter (HOSPITAL_COMMUNITY): Payer: Self-pay | Admitting: General Practice

## 2018-12-11 ENCOUNTER — Ambulatory Visit: Payer: BLUE CROSS/BLUE SHIELD | Admitting: Endocrinology

## 2018-12-11 LAB — CBC
HCT: 26.9 % — ABNORMAL LOW (ref 39.0–52.0)
HCT: 26.9 % — ABNORMAL LOW (ref 39.0–52.0)
Hemoglobin: 8.6 g/dL — ABNORMAL LOW (ref 13.0–17.0)
Hemoglobin: 8.5 g/dL — ABNORMAL LOW (ref 13.0–17.0)
MCH: 27.9 pg (ref 26.0–34.0)
MCH: 27.3 pg (ref 26.0–34.0)
MCHC: 32 g/dL (ref 30.0–36.0)
MCHC: 31.6 g/dL (ref 30.0–36.0)
MCV: 87.3 fL (ref 80.0–100.0)
MCV: 86.5 fL (ref 80.0–100.0)
Platelets: 249 10*3/uL (ref 150–400)
Platelets: 222 10*3/uL (ref 150–400)
RBC: 3.08 MIL/uL — ABNORMAL LOW (ref 4.22–5.81)
RBC: 3.11 MIL/uL — ABNORMAL LOW (ref 4.22–5.81)
RDW: 13.4 % (ref 11.5–15.5)
RDW: 13.5 % (ref 11.5–15.5)
WBC: 7.7 10*3/uL (ref 4.0–10.5)
WBC: 5 10*3/uL (ref 4.0–10.5)
nRBC: 0 % (ref 0.0–0.2)
nRBC: 0 % (ref 0.0–0.2)

## 2018-12-11 LAB — COMPREHENSIVE METABOLIC PANEL
ALT: 10 U/L (ref 0–44)
AST: 15 U/L (ref 15–41)
Albumin: 1.9 g/dL — ABNORMAL LOW (ref 3.5–5.0)
Alkaline Phosphatase: 75 U/L (ref 38–126)
Anion gap: 10 (ref 5–15)
BUN: 32 mg/dL — ABNORMAL HIGH (ref 8–23)
CO2: 22 mmol/L (ref 22–32)
Calcium: 7.8 mg/dL — ABNORMAL LOW (ref 8.9–10.3)
Chloride: 107 mmol/L (ref 98–111)
Creatinine, Ser: 2.21 mg/dL — ABNORMAL HIGH (ref 0.61–1.24)
GFR calc Af Amer: 34 mL/min — ABNORMAL LOW (ref >60)
GFR calc non Af Amer: 29 mL/min — ABNORMAL LOW (ref >60)
Glucose, Bld: 186 mg/dL — ABNORMAL HIGH (ref 70–99)
Potassium: 4.4 mmol/L (ref 3.5–5.1)
Sodium: 139 mmol/L (ref 135–145)
Total Bilirubin: 0.8 mg/dL (ref 0.3–1.2)
Total Protein: 6.2 g/dL — ABNORMAL LOW (ref 6.5–8.1)

## 2018-12-11 LAB — STREP PNEUMONIAE URINARY ANTIGEN: Strep Pneumo Urinary Antigen: NEGATIVE

## 2018-12-11 LAB — GLUCOSE, CAPILLARY
Glucose-Capillary: 172 mg/dL — ABNORMAL HIGH (ref 70–99)
Glucose-Capillary: 143 mg/dL — ABNORMAL HIGH (ref 70–99)
Glucose-Capillary: 180 mg/dL — ABNORMAL HIGH (ref 70–99)

## 2018-12-11 LAB — TYPE AND SCREEN
ABO/RH(D): A POS
Antibody Screen: NEGATIVE
Unit division: 0

## 2018-12-11 LAB — URINE CULTURE
Culture: NO GROWTH
Special Requests: NORMAL

## 2018-12-11 LAB — BPAM RBC
Blood Product Expiration Date: 202003062359
ISSUE DATE / TIME: 202002122226
Unit Type and Rh: 6200

## 2018-12-11 LAB — HIV ANTIBODY (ROUTINE TESTING W REFLEX): HIV Screen 4th Generation wRfx: NONREACTIVE

## 2018-12-11 MED ORDER — ADULT MULTIVITAMIN W/MINERALS CH
1.0000 | ORAL_TABLET | Freq: Every day | ORAL | Status: DC
  Administered 2018-12-11 – 2018-12-12 (×2): 1 via ORAL
  Filled 2018-12-11 (×2): qty 1

## 2018-12-11 MED ORDER — HYDRALAZINE HCL 20 MG/ML IJ SOLN
5.0000 mg | Freq: Four times a day (QID) | INTRAMUSCULAR | Status: DC | PRN
  Administered 2018-12-11 (×2): 5 mg via INTRAVENOUS
  Filled 2018-12-11 (×2): qty 1

## 2018-12-11 MED ORDER — DOXYCYCLINE HYCLATE 100 MG PO TABS
100.0000 mg | ORAL_TABLET | Freq: Two times a day (BID) | ORAL | Status: DC
  Administered 2018-12-11 – 2018-12-12 (×3): 100 mg via ORAL
  Filled 2018-12-11 (×3): qty 1

## 2018-12-11 MED ORDER — GLUCERNA SHAKE PO LIQD
237.0000 mL | Freq: Three times a day (TID) | ORAL | Status: DC
  Administered 2018-12-11 – 2018-12-12 (×4): 237 mL via ORAL

## 2018-12-11 MED ORDER — CEPHALEXIN 250 MG PO CAPS
500.0000 mg | ORAL_CAPSULE | Freq: Two times a day (BID) | ORAL | Status: DC
  Administered 2018-12-11 – 2018-12-12 (×3): 500 mg via ORAL
  Filled 2018-12-11 (×3): qty 2

## 2018-12-11 NOTE — Progress Notes (Signed)
Pt refused his insulin- reports that novolog drops his blood sugars very quickly.   States he does not want coverage for anything above 220

## 2018-12-11 NOTE — Progress Notes (Signed)
Initial Nutrition Assessment  DOCUMENTATION CODES:   Not applicable  INTERVENTION:   -D/c Ensure Enlive po BID, each supplement provides 350 kcal and 20 grams of protein -Glucerna Shake po TID, each supplement provides 220 kcal and 10 grams of protein  NUTRITION DIAGNOSIS:   Increased nutrient needs related to chronic illness(CHF) as evidenced by estimated needs.  GOAL:   Patient will meet greater than or equal to 90% of their needs  MONITOR:   PO intake, Supplement acceptance, Labs, Weight trends, Skin, I & O's  REASON FOR ASSESSMENT:   Malnutrition Screening Tool    ASSESSMENT:   William Webb is a 68 y.o. male past medical history of diabetes mellitus type 2, chronic kidney disease stage III, essential hypertension, with a history of noncompliance of his medication, coronary artery disease status post and STEMI with stent placement in December 2019 currently on aspirin and Brilinta, history of pneumonia discharged from the hospital on 11/25/2018,d/c from the hospital on 12/04/2018 for syncopal episode, went to short stay today to get his IV iron and was found to have a fever, he is also been complaining of a productive cough with hematemesis fatigue entire for the last week, anorexic M with some mild shortness of breath.  He denies any new sick contacts, denies any chest pain nausea vomiting or diarrhea, he did have melanotic stools about a week ago.  Pt admitted with hemoptysis likely secondary to HCAP vs acute bronchitis with acute respiratory failure with hypoxia/sepsis.   Case discussed with RN, who reports that pt is eating well and will likely discharge home tomorrow.   Attempted to speak with pt, who is familiar to this RD from prior admission. Pt on phone at time of visit and did not disengage in conversation, so RD unable to obtain further history at this time. Documented meal completion 75%. Observed pt consumed 50% of eggs, toast, and grits off breakfast tray.    Reviewed wt hx; noted that pt has experienced a 7.5% wt loss over the past 3 months, which while not significant for time frame, is concerning due to multiple hospitalizations, comorbitites, and history of poor oral intake.   Lab Results  Component Value Date   HGBA1C 6.6 (H) 10/21/2018   PTA DM medications are 5 units insulin degludec daily.   Labs reviewed: CBGS: 143-180 (inpatient orders for glycemic control are0-5 units insulin aspart q HS and 0-9 units insulin aspart TID with meals ).   NUTRITION - FOCUSED PHYSICAL EXAM:    Most Recent Value  Orbital Region  No depletion  Upper Arm Region  No depletion  Thoracic and Lumbar Region  No depletion  Buccal Region  No depletion  Temple Region  No depletion  Clavicle Bone Region  No depletion  Clavicle and Acromion Bone Region  No depletion  Scapular Bone Region  No depletion  Dorsal Hand  No depletion  Patellar Region  No depletion  Anterior Thigh Region  No depletion  Posterior Calf Region  No depletion  Edema (RD Assessment)  None  Hair  Reviewed  Eyes  Reviewed  Mouth  Reviewed  Skin  Reviewed  Nails  Reviewed       Diet Order:   Diet Order            Diet Heart Room service appropriate? Yes; Fluid consistency: Thin  Diet effective now              EDUCATION NEEDS:   No education needs have been  identified at this time  Skin:  Skin Assessment: Reviewed RN Assessment  Last BM:  12/11/18  Height:   Ht Readings from Last 1 Encounters:  12/10/18 6\' 3"  (1.905 m)    Weight:   Wt Readings from Last 1 Encounters:  12/11/18 90.7 kg    Ideal Body Weight:  89.1 kg  BMI:  Body mass index is 24.99 kg/m.  Estimated Nutritional Needs:   Kcal:  2250-2450  Protein:  115-130 grams  Fluid:  2.2-2.4 L    Aneshia Jacquet A. Jimmye Norman, RD, LDN, CDE Pager: [phone] After hours Pager: [phone]

## 2018-12-11 NOTE — Evaluation (Signed)
Physical Therapy Evaluation & Discharge Patient Details Name: William Webb MRN: [id number] DOB: [birth year] Today's Date: 12/11/2018   History of Present Illness  Pt is a 68 y.o. male admitted 12/10/18 with fatigue, cough and SOB. CXR showed resolving patchy infiltrates. Worked up for possible HCAP vs acute bronchitis. PMH includes CKD3, DM2, HTN, CAD.     Clinical Impression  Patient evaluated by Physical Therapy with no further acute PT needs identified. PTA, pt indep and lives alone. Today, pt indep with mobility. SpO2 down to 87% with ambulation, quickly returning to 88-91% on RA. Incentive spirometry provided, pt able to demonstrate correct technique; frequency encouraged. All education has been completed and the patient has no further questions. Encouraged continued ambulation during hospital admission. Acute PT is signing off. Thank you for this referral.    Follow Up Recommendations No PT follow up    Equipment Recommendations  None recommended by PT    Recommendations for Other Services       Precautions / Restrictions Precautions Precautions: None Restrictions Weight Bearing Restrictions: No      Mobility  Bed Mobility Overal bed mobility: Independent                Transfers Overall transfer level: Independent                  Ambulation/Gait Ambulation/Gait assistance: Independent Gait Distance (Feet): 400 Feet Assistive device: None Gait Pattern/deviations: Step-through pattern;Decreased stride length   Gait velocity interpretation: 1.31 - 2.62 ft/sec, indicative of limited community Conservation officer, historic buildings Rankin (Stroke Patients Only)       Balance Overall balance assessment: Needs assistance   Sitting balance-Leahy Scale: Normal       Standing balance-Leahy Scale: Good                   Standardized Balance Assessment Standardized Balance Assessment : Dynamic Gait  Index   Dynamic Gait Index Level Surface: Normal Change in Gait Speed: Normal Gait with Horizontal Head Turns: Mild Impairment Gait with Vertical Head Turns: Mild Impairment Gait and Pivot Turn: Mild Impairment Step Over Obstacle: Mild Impairment Step Around Obstacles: Normal Steps: Mild Impairment Total Score: 19       Pertinent Vitals/Pain Pain Assessment: No/denies pain    Home Living Family/patient expects to be discharged to:: Private residence Living Arrangements: Alone   Type of Home: Apartment Home Access: Level entry     Home Layout: One level Home Equipment: None      Prior Function Level of Independence: Independent               Hand Dominance        Extremity/Trunk Assessment   Upper Extremity Assessment Upper Extremity Assessment: Overall WFL for tasks assessed    Lower Extremity Assessment Lower Extremity Assessment: Overall WFL for tasks assessed       Communication   Communication: No difficulties  Cognition Arousal/Alertness: Awake/alert Behavior During Therapy: WFL for tasks assessed/performed Overall Cognitive Status: Within Functional Limits for tasks assessed                                        General Comments      Exercises     Assessment/Plan    PT Assessment Patent does not need any  further PT services  PT Problem List         PT Treatment Interventions      PT Goals (Current goals can be found in the Care Plan section)  Acute Rehab PT Goals PT Goal Formulation: All assessment and education complete, DC therapy    Frequency     Barriers to discharge        Co-evaluation               AM-PAC PT "6 Clicks" Mobility  Outcome Measure Help needed turning from your back to your side while in a flat bed without using bedrails?: None Help needed moving from lying on your back to sitting on the side of a flat bed without using bedrails?: None Help needed moving to and from a bed to a  chair (including a wheelchair)?: None Help needed standing up from a chair using your arms (e.g., wheelchair or bedside chair)?: None Help needed to walk in hospital room?: None Help needed climbing 3-5 steps with a railing? : A Little 6 Click Score: 23    End of Session Equipment Utilized During Treatment: Gait belt Activity Tolerance: Patient tolerated treatment well Patient left: in bed;with call bell/phone within reach Nurse Communication: Mobility status PT Visit Diagnosis: Other abnormalities of gait and mobility (R26.89)    Time: 2258-3462 PT Time Calculation (min) (ACUTE ONLY): 12 min   Charges:   PT Evaluation $PT Eval Low Complexity: 1 Low        Mabeline Caras, PT, DPT Acute Rehabilitation Services  Pager [phone] Office [place] 12/11/2018, 8:59 AM

## 2018-12-11 NOTE — Care Management Note (Signed)
Case Management Note  Patient Details  Name: William Webb MRN: [id number] Date of Birth: Sep 15, 1951  Subjective/Objective:       Acute on Chronic Resp Failure            Action/Plan: Patient lives at home with his daughter; PCP: Billie Ruddy, MD; has private insurance with BCBS with prescription drug coverage; patient is active with [facility] as prior to admission; CM will continue to follow for progression of care.  Expected Discharge Date:      Possibly 12/14/2018            Expected Discharge Plan:  [place]  Discharge planning Services  CM Consult  HH Arranged:  RN, Disease Management, PT [facility] Agency:  [place]  Status of Service:  In process, will continue to follow  Sherrilyn Rist [phone] 12/11/2018, 3:05 PM

## 2018-12-11 NOTE — Progress Notes (Signed)
Per pt- MD told him that they were going to stop due to bleeding issues.   Pt wants to hold for now until he talks to doctor

## 2018-12-11 NOTE — Progress Notes (Signed)
[facility] Progress Note  Patient: William Webb [facility] HER:[id number]   PCP: Billie Ruddy, MD DOB: [birth year]   DOA: 12/10/2018   DOS: 12/11/2018   Date of Service: the patient was seen and examined on 12/11/2018  Brief hospital course: Pt. with PMH of type II DM, CKD stage III, HTN, CAD S/P PCI with angioplasty, GI bleed; admitted on 12/10/2018, presented with complaint of fever and cough and shortness of breath, was found to have anemia and pneumonia. Currently further plan is continue supportive measures.  Subjective: Feeling better, no nausea no vomiting.  No chest pain or shortness of breath.  No blood in the stool reported by patient.  Assessment and Plan: 1.  Community-acquired pneumonia. Likely viral infection because chest x-ray does not show any significant pneumonia and the opacity on the right side has improved. Leukocytosis improved as well. Patient was started on IV vancomycin and cefepime due to his recent hospitalization we will transition him to oral antibiotics. Follow-up on cultures.  2.  Symptomatic anemia. Thought to be secondary to combination of chronic kidney disease as well as possible upper GI bleed. Baseline hemoglobin around 8-9.  Presents with hemoglobin of 7.9. S/P 1 PRBC transfusion. 2 sets of H&H stable 8.68.5. No further bleeding reported here in the hospital. Monitor for now. Outpatient follow-up with GI for EGD.  3.  CAD. HTN. 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 Patient was started on aspirin and Brilinta for medical management. Per last note from cardiology on 12/02/2018 continue aspirin if possible okay to hold Brilinta in view of blood loss anemia. Not a revascularization candidate for history of anemia and chronic kidney disease. We will discontinue Brilinta this admission. No chest pain.  Troponins and EKGs are unremarkable as well. Continue Norvasc 10 mg,  hydralazine 100 mg every 8 hours, pressor 75 mg daily, Imdur 30 mg daily. Continue Crestor 20 mg daily for dyslipidemia.  4.  History of GI bleed. Follows up with [facility] gastroenterology. Had an appointment today outpatient which is been canceled due to patient being hospitalized. Patient will require an follow-up appointment with gastroenterology for outpatient EGD. Primary barrier for EGD was patient being on Brilinta which will be discontinued. Continue PPI.  5. Type 2 Diabetes Mellitus, uncontroled with neurologic and renal complication last hemoglobin A1c was 7.1 in October 2019,Check hemoglobin A1c. hold his oral hypoglycemic agents. On insulin sliding scale moderate with Basal insulin.  Body mass index is 24.99 kg/m.  Nutrition Problem: Increased nutrient needs Etiology: chronic illness(CHF) Interventions: Interventions: Glucerna shake      Diet: Cardiac and carb modified diet DVT Prophylaxis: scd  Advance goals of care discussion: full ode  Family Communication: no family was present at bedside, at the time of interview.   Disposition:  Discharge to home.  Consultants: none Procedures: none  Scheduled Meds: . amLODipine  10 mg Oral Daily  . aspirin EC  81 mg Oral Daily  . cephALEXin  500 mg Oral Q12H  . doxycycline  100 mg Oral Q12H  . feeding supplement (GLUCERNA SHAKE)  237 mL Oral TID BM  . gabapentin  1,200 mg Oral TID  . hydrALAZINE  100 mg Oral Q8H  . insulin aspart  0-5 Units Subcutaneous QHS  . insulin aspart  0-9 Units Subcutaneous TID WC  . isosorbide mononitrate  30 mg Oral Daily  . metoprolol tartrate  75 mg Oral Daily  . multivitamin with minerals  1 tablet Oral Daily  .  pantoprazole  40 mg Oral Daily  . rosuvastatin  20 mg Oral q1800  . sodium chloride flush  3 mL Intravenous Q12H   Continuous Infusions: . sodium chloride     PRN Meds: sodium chloride, acetaminophen **OR** acetaminophen, hydrALAZINE, linaclotide, methocarbamol,  ondansetron **OR** ondansetron (ZOFRAN) IV, polyethylene glycol, sodium chloride flush Antibiotics: Anti-infectives (From admission, onward)   Start     Dose/Rate Route Frequency Ordered Stop   12/12/18 0600  vancomycin (VANCOCIN) 1,750 mg in sodium chloride 0.9 % 500 mL IVPB  Status:  Discontinued     1,750 mg 250 mL/hr over 120 Minutes Intravenous Every 36 hours 12/10/18 1735 12/11/18 1002   12/11/18 1100  cephALEXin (KEFLEX) capsule 500 mg     500 mg Oral Every 12 hours 12/11/18 1003     12/11/18 1100  doxycycline (VIBRA-TABS) tablet 100 mg     100 mg Oral Every 12 hours 12/11/18 1003     12/10/18 2200  ceFEPIme (MAXIPIME) 1 g in sodium chloride 0.9 % 100 mL IVPB  Status:  Discontinued     1 g 200 mL/hr over 30 Minutes Intravenous Every 8 hours 12/10/18 2006 12/10/18 2030   12/10/18 1800  ceFEPIme (MAXIPIME) 2 g in sodium chloride 0.9 % 100 mL IVPB  Status:  Discontinued     2 g 200 mL/hr over 30 Minutes Intravenous Every 24 hours 12/10/18 1729 12/11/18 1002   12/10/18 1715  vancomycin (VANCOCIN) 2,000 mg in sodium chloride 0.9 % 500 mL IVPB     2,000 mg 250 mL/hr over 120 Minutes Intravenous  Once 12/10/18 1658 12/10/18 2031       Objective: Physical Exam: Vitals:   12/11/18 0314 12/11/18 0558 12/11/18 0559 12/11/18 1657  BP: (!) 166/83 (!) 108/97 (!) 122/98 (!) 173/134  Pulse: 77 85  73  Resp: 18   18  Temp: 98 F (36.7 C)   98 F (36.7 C)  TempSrc: Oral   Oral  SpO2: 98% 95%  99%  Weight:      Height:        Intake/Output Summary (Last 24 hours) at 12/11/2018 1730 Last data filed at 12/11/2018 1651 Gross per 24 hour  Intake 1488.43 ml  Output 800 ml  Net 688.43 ml   Filed Weights   12/10/18 2002 12/11/18 0214  Weight: 90.9 kg 90.7 kg   General: Alert, Awake and Oriented to Time, Place and Person. Appear in mild distress, affect appropriate Eyes: PERRL, Conjunctiva normal ENT: Oral Mucosa clear moist Neck: no JVD, no Abnormal Mass Or lumps Cardiovascular: S1  and S2 Present, no Murmur, Peripheral Pulses Present Respiratory: normal respiratory effort, Bilateral Air entry equal and Decreased, no use of accessory muscle, Clear to Auscultation, no Crackles, no wheezes Abdomen: Bowel Sound present, Soft and no tenderness, no hernia Skin: no redness, no Rash, no induration Extremities: no Pedal edema, no calf tenderness Neurologic: Grossly no focal neuro deficit. Bilaterally Equal motor strength  Data Reviewed: CBC: Recent Labs  Lab 12/09/18 1519 12/10/18 1157 12/11/18 0425 12/11/18 1650  WBC 13.6* 13.3* 7.7 5.0  NEUTROABS 12.5* 11.9*  --   --   HGB 8.7 Repeated and verified X2.* 7.9* 8.6* 8.5*  HCT 26.0 Repeated and verified X2.* 26.0* 26.9* 26.9*  MCV 84.9 90.3 87.3 86.5  PLT 264.0 258 249 315   Basic Metabolic Panel: Recent Labs  Lab 12/10/18 1157 12/11/18 0425  NA 139 139  K 5.1 4.4  CL 105 107  CO2 21* 22  GLUCOSE  177* 186*  BUN 33* 32*  CREATININE 2.33* 2.21*  CALCIUM 8.0* 7.8*    Liver Function Tests: Recent Labs  Lab 12/10/18 1157 12/11/18 0425  AST 16 15  ALT 10 10  ALKPHOS 76 75  BILITOT 0.8 0.8  PROT 5.9* 6.2*  ALBUMIN 2.0* 1.9*   Recent Labs  Lab 12/10/18 1157  LIPASE 22   No results for input(s): AMMONIA in the last 168 hours. Coagulation Profile: No results for input(s): INR, PROTIME in the last 168 hours. Cardiac Enzymes: Recent Labs  Lab 12/10/18 1157  TROPONINI 0.04*   BNP (last 3 results) No results for input(s): PROBNP in the last 8760 hours. CBG: Recent Labs  Lab 12/10/18 1314 12/10/18 2117 12/11/18 0401 12/11/18 0753 12/11/18 1238  GLUCAP 151* 190* 172* 143* 180*   Studies: No results found.   Time spent: 35 minutes  Author: Berle Mull, MD [facility] 12/11/2018 5:30 PM  To reach On-call, see care teams to locate the attending and reach out to them via [email or web address]. If 7PM-7AM, please contact night-coverage If you still have difficulty reaching the attending  provider, please page the [facility] (Director on Call) for [facility] on amion for assistance.

## 2018-12-12 ENCOUNTER — Telehealth: Payer: Self-pay

## 2018-12-12 LAB — GLUCOSE, CAPILLARY
Glucose-Capillary: 199 mg/dL — ABNORMAL HIGH (ref 70–99)
Glucose-Capillary: 150 mg/dL — ABNORMAL HIGH (ref 70–99)
Glucose-Capillary: 119 mg/dL — ABNORMAL HIGH (ref 70–99)
Glucose-Capillary: 157 mg/dL — ABNORMAL HIGH (ref 70–99)

## 2018-12-12 LAB — CBC
HCT: 27.3 % — ABNORMAL LOW (ref 39.0–52.0)
Hemoglobin: 8.6 g/dL — ABNORMAL LOW (ref 13.0–17.0)
MCH: 27.5 pg (ref 26.0–34.0)
MCHC: 31.5 g/dL (ref 30.0–36.0)
MCV: 87.2 fL (ref 80.0–100.0)
Platelets: 225 10*3/uL (ref 150–400)
RBC: 3.13 MIL/uL — ABNORMAL LOW (ref 4.22–5.81)
RDW: 13.4 % (ref 11.5–15.5)
WBC: 5.3 10*3/uL (ref 4.0–10.5)
nRBC: 0 % (ref 0.0–0.2)

## 2018-12-12 LAB — HEMOGLOBIN A1C
Hgb A1c MFr Bld: 7.3 % — ABNORMAL HIGH (ref 4.8–5.6)
Mean Plasma Glucose: 163 mg/dL

## 2018-12-12 MED ORDER — CEPHALEXIN 500 MG PO CAPS: 500.0000 mg | ORAL_CAPSULE | Freq: Two times a day (BID) | ORAL | 0 refills | Status: AC

## 2018-12-12 MED ORDER — DOXYCYCLINE HYCLATE 100 MG PO TABS: 100.0000 mg | ORAL_TABLET | Freq: Two times a day (BID) | ORAL | 0 refills | Status: AC

## 2018-12-12 NOTE — Progress Notes (Signed)
Call placed to [facility] to notify of telemetry monitoring d/c.   

## 2018-12-15 ENCOUNTER — Encounter: Payer: Self-pay | Admitting: Family Medicine

## 2018-12-15 ENCOUNTER — Other Ambulatory Visit: Payer: Self-pay

## 2018-12-15 ENCOUNTER — Ambulatory Visit (INDEPENDENT_AMBULATORY_CARE_PROVIDER_SITE_OTHER): Payer: BLUE CROSS/BLUE SHIELD | Admitting: Family Medicine

## 2018-12-15 VITALS — BP 142/62 | HR 88 | Temp 98.0°F | Wt 212.0 lb

## 2018-12-15 DIAGNOSIS — Z794 Long term (current) use of insulin: Secondary | ICD-10-CM

## 2018-12-15 DIAGNOSIS — D631 Anemia in chronic kidney disease: Secondary | ICD-10-CM

## 2018-12-15 DIAGNOSIS — N183 Chronic kidney disease, stage 3 unspecified: Secondary | ICD-10-CM

## 2018-12-15 DIAGNOSIS — I10 Essential (primary) hypertension: Secondary | ICD-10-CM | POA: Diagnosis not present

## 2018-12-15 DIAGNOSIS — E1122 Type 2 diabetes mellitus with diabetic chronic kidney disease: Secondary | ICD-10-CM | POA: Diagnosis not present

## 2018-12-15 LAB — CULTURE, BLOOD (ROUTINE X 2)
Culture: NO GROWTH
Culture: NO GROWTH

## 2018-12-15 MED ORDER — FREESTYLE LIBRE 14 DAY SENSOR MISC: 1.0000 | Freq: Every day | 0 refills | Status: DC

## 2018-12-15 MED ORDER — FREESTYLE LIBRE 14 DAY SENSOR MISC: 1.0000 | Freq: Every day | 11 refills | Status: DC

## 2018-12-15 MED ORDER — FREESTYLE LIBRE READER DEVI: 1.0000 | 0 refills | Status: DC

## 2018-12-15 NOTE — Patient Instructions (Signed)
Basics of Medicine Management Taking your medicines correctly is an important part of managing or preventing medical problems. Make sure you know what disease or condition your medicine is treating, and how and when to take it. If you do not take your medicine correctly, it may not work well and may cause unpleasant side effects, including serious health problems. What should I do when I am taking medicines?   Read all the labels and inserts that come with your medicines. Review the information often.  Talk with your pharmacist if you get a refill and notice a change in the size, color, or shape of your medicines.  Know the potential side effects for each medicine that you take.  Try to get all your medicines from the same pharmacy. The pharmacist will have all your information and will understand how your medicines will affect each other (interact).  Tell your health care provider about all your medicines, including over-the-counter medicines, vitamins, and herbal or dietary supplements. He or she will make sure that nothing will interact with any of your prescribed medicines. How can I take my medicines safely?  Take medicines only as told by your health care provider. ? Do not take more of your medicine than instructed. ? Do not take anyone else's medicines. ? Do not share your medicines with others. ? Do not stop taking your medicines unless your health care provider tells you to do so. ? You may need to avoid alcohol or certain foods or liquids when taking certain medicines. Follow your health care provider's instructions.  Do not split, mash, or chew your medicines unless your health care provider tells you to do so. Tell your health care provider if you have trouble swallowing your medicines.  For liquid medicine, use the dosing container that was provided. How should I organize my medicines?  Know your medicines  Know what each of your medicines looks like. This includes size,  color, and shape. Tell your health care provider if you are having trouble recognizing all the medicines that you are taking.  If you cannot tell your medicines apart because they look similar, keep them in original bottles.  If you cannot read the labels on the bottles, tell your pharmacist to put your medicines in containers with large print.  Review your medicines and your schedule with family members, a friend, or a caregiver. Use a pill organizer  Use a tool to organize your medicine schedule. Tools include a weekly pillbox, a written chart, a notebook, or a calendar.  Your tool should help you remember the following things about each medicine: ? The name of the medicine. ? The amount (dose) to take. ? The schedule. This is the day and time the medicine should be taken. ? The appearance. This includes color, shape, size, and stamp. ? How to take your medicines. This includes instructions to take them with food, without food, with fluids, or with other medicines.  Create reminders for taking your medicines. Use sticky notes, or alarms on your watch, mobile device, or phone calendar.  You may choose to use a more advanced management system. These systems have storage, alarms, and visual and audio prompts.  Some medicines can be taken on an "as-needed" basis. These include medicines for nausea or pain. If you take an as-needed medicine, write down the name and dose, as well as the date and time that you took it. How should I plan for travel?  Take your pillbox, medicines, and organization system with  you when traveling.  Have your medicines refilled before you travel. This will ensure that you do not run out of your medicines while you are away from home.  Always carry an updated list of your medicines with you. If there is an emergency, a first responder can quickly see what medicines you are taking.  Do not pack your medicines in checked luggage in case your luggage is lost or  delayed.  If any of your medicines is considered a controlled substance, make sure you bring a letter from your health care provider with you. How should I store and discard my medicines? For safe storage:  Store medicines in a cool, dry area away from light, or as directed by your health care provider. Do not store medicines in the bathroom. Heat and humidity will affect them.  Do not store your medicines with other chemicals, or with medicines for pets or other household members.  Keep medicines away from children and pets. Do not leave them on counters or bedside tables. Store them in high cabinets or on high shelves. For safe disposal:  Check expiration dates regularly. Do not take expired medicines. Discard medicines that are older than the expiration date.  Learn a safe way to dispose of your medicines. You may: ? Use a local government, hospital, or pharmacy medicine-take-back program. ? Mix the medicines with inedible substances, put them in a sealed bag or empty container, and throw them in the trash. What should I remember?  Tell your health care provider if you: ? Experience side effects. ? Have new symptoms. ? Have other concerns about taking your medicines.  Review your medicines regularly with your health care provider. Other medicines, diet, medical conditions, weight changes, and daily habits can all affect how medicines work. Ask if you need to continue taking each medicine, and discuss how well each one is working.  Refill your medicines early to avoid running out of them.  In case of an accidental overdose, call your local [place] at [phone] or visit your local emergency department immediately. This is important. Summary  Taking your medicines correctly is an important part of managing or preventing medical problems.  You need to make sure that you understand what you are taking a medicine for, as well as how and when you need to take  it.  Know your medicines and use a pill organizer to help you take your medicines correctly.  In case of an accidental overdose, call your local [place] at [phone] or visit your local emergency department immediately. This is important. This information is not intended to replace advice given to you by your health care provider. Make sure you discuss any questions you have with your health care provider. Document Released: 01/30/2011 Document Revised: 10/10/2017 Document Reviewed: 10/10/2017 Elsevier Interactive Patient Education  2019 [employer].  Managing Your Hypertension Hypertension is commonly called high blood pressure. This is when the force of your blood pressing against the walls of your arteries is too strong. Arteries are blood vessels that carry blood from your heart throughout your body. Hypertension forces the heart to work harder to pump blood, and may cause the arteries to become narrow or stiff. Having untreated or uncontrolled hypertension can cause heart attack, stroke, kidney disease, and other problems. What are blood pressure readings? A blood pressure reading consists of a higher number over a lower number. Ideally, your blood pressure should be below 120/80. The first ("top") number is called the systolic  pressure. It is a measure of the pressure in your arteries as your heart beats. The second ("bottom") number is called the diastolic pressure. It is a measure of the pressure in your arteries as the heart relaxes. What does my blood pressure reading mean? Blood pressure is classified into four stages. Based on your blood pressure reading, your health care provider may use the following stages to determine what type of treatment you need, if any. Systolic pressure and diastolic pressure are measured in a unit called mm Hg. Normal  Systolic pressure: below 166.  Diastolic pressure: below 80. Elevated  Systolic pressure: 063-016.  Diastolic  pressure: below 80. Hypertension stage 1  Systolic pressure: 010-932.  Diastolic pressure: 35-57. Hypertension stage 2  Systolic pressure: 322 or above.  Diastolic pressure: 90 or above. What health risks are associated with hypertension? Managing your hypertension is an important responsibility. Uncontrolled hypertension can lead to:  A heart attack.  A stroke.  A weakened blood vessel (aneurysm).  Heart failure.  Kidney damage.  Eye damage.  Metabolic syndrome.  Memory and concentration problems. What changes can I make to manage my hypertension? Hypertension can be managed by making lifestyle changes and possibly by taking medicines. Your health care provider will help you make a plan to bring your blood pressure within a normal range. Eating and drinking   Eat a diet that is high in fiber and potassium, and low in salt (sodium), added sugar, and fat. An example eating plan is called the DASH (Dietary Approaches to Stop Hypertension) diet. To eat this way: ? Eat plenty of fresh fruits and vegetables. Try to fill half of your plate at each meal with fruits and vegetables. ? Eat whole grains, such as whole wheat pasta, brown rice, or whole grain bread. Fill about one quarter of your plate with whole grains. ? Eat low-fat diary products. ? Avoid fatty cuts of meat, processed or cured meats, and poultry with skin. Fill about one quarter of your plate with lean proteins such as fish, chicken without skin, beans, eggs, and tofu. ? Avoid premade and processed foods. These tend to be higher in sodium, added sugar, and fat.  Reduce your daily sodium intake. Most people with hypertension should eat less than 1,500 mg of sodium a day.  Limit alcohol intake to no more than 1 drink a day for nonpregnant women and 2 drinks a day for men. One drink equals 12 oz of beer, 5 oz of wine, or 1 oz of hard liquor. Lifestyle  Work with your health care provider to maintain a healthy body  weight, or to lose weight. Ask what an ideal weight is for you.  Get at least 30 minutes of exercise that causes your heart to beat faster (aerobic exercise) most days of the week. Activities may include walking, swimming, or biking.  Include exercise to strengthen your muscles (resistance exercise), such as weight lifting, as part of your weekly exercise routine. Try to do these types of exercises for 30 minutes at least 3 days a week.  Do not use any products that contain nicotine or tobacco, such as cigarettes and e-cigarettes. If you need help quitting, ask your health care provider.  Control any long-term (chronic) conditions you have, such as high cholesterol or diabetes. Monitoring  Monitor your blood pressure at home as told by your health care provider. Your personal target blood pressure may vary depending on your medical conditions, your age, and other factors.  Have your blood  pressure checked regularly, as often as told by your health care provider. Working with your health care provider  Review all the medicines you take with your health care provider because there may be side effects or interactions.  Talk with your health care provider about your diet, exercise habits, and other lifestyle factors that may be contributing to hypertension.  Visit your health care provider regularly. Your health care provider can help you create and adjust your plan for managing hypertension. Will I need medicine to control my blood pressure? Your health care provider may prescribe medicine if lifestyle changes are not enough to get your blood pressure under control, and if:  Your systolic blood pressure is 130 or higher.  Your diastolic blood pressure is 80 or higher. Take medicines only as told by your health care provider. Follow the directions carefully. Blood pressure medicines must be taken as prescribed. The medicine does not work as well when you skip doses. Skipping doses also puts  you at risk for problems. Contact a health care provider if:  You think you are having a reaction to medicines you have taken.  You have repeated (recurrent) headaches.  You feel dizzy.  You have swelling in your ankles.  You have trouble with your vision. Get help right away if:  You develop a severe headache or confusion.  You have unusual weakness or numbness, or you feel faint.  You have severe pain in your chest or abdomen.  You vomit repeatedly.  You have trouble breathing. Summary  Hypertension is when the force of blood pumping through your arteries is too strong. If this condition is not controlled, it may put you at risk for serious complications.  Your personal target blood pressure may vary depending on your medical conditions, your age, and other factors. For most people, a normal blood pressure is less than 120/80.  Hypertension is managed by lifestyle changes, medicines, or both. Lifestyle changes include weight loss, eating a healthy, low-sodium diet, exercising more, and limiting alcohol. This information is not intended to replace advice given to you by your health care provider. Make sure you discuss any questions you have with your health care provider. Document Released: 07/09/2012 Document Revised: 09/12/2016 Document Reviewed: 09/12/2016 Elsevier Interactive Patient Education  2019 [employer].
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
## 2018-12-16 ENCOUNTER — Telehealth: Payer: Self-pay | Admitting: Family Medicine

## 2018-12-16 NOTE — Telephone Encounter (Signed)
Pt was seen at the office on 12/12/2018

## 2018-12-17 ENCOUNTER — Telehealth: Payer: Self-pay | Admitting: Family Medicine

## 2018-12-17 NOTE — Telephone Encounter (Signed)
Copied from [place] [id number]. Topic: Quick Communication - Home Health Verbal Orders >> Dec 17, 2018 11:25 AM Vernona Rieger wrote: Caller/Agency: Mary with [facility] Number: [phone] and you can ask for any patient care manager Requesting OT/PT/Skilled Nursing/Social Work: Nursing seen him on 2/16 and would like continuation orders  Frequency: 2 week 2, 1 week 2 & 2 PRN visits

## 2018-12-17 NOTE — Telephone Encounter (Signed)
ok 

## 2018-12-17 NOTE — Telephone Encounter (Signed)
Ok for orders? 

## 2018-12-17 NOTE — Telephone Encounter (Signed)
Spoke with Santiago Glad voiced understanding of the approved orders as requested

## 2018-12-18 ENCOUNTER — Other Ambulatory Visit: Payer: Self-pay | Admitting: Family Medicine

## 2018-12-18 NOTE — Telephone Encounter (Signed)
Rx for Brilinta was D/C at the Hospital on 12/12/2018

## 2018-12-19 ENCOUNTER — Encounter: Payer: Self-pay | Admitting: Family Medicine

## 2018-12-19 ENCOUNTER — Inpatient Hospital Stay: Payer: BLUE CROSS/BLUE SHIELD | Admitting: Family Medicine

## 2018-12-19 NOTE — Telephone Encounter (Signed)
Ok to sent refill

## 2018-12-20 ENCOUNTER — Other Ambulatory Visit: Payer: Self-pay | Admitting: Physical Medicine & Rehabilitation

## 2018-12-20 ENCOUNTER — Other Ambulatory Visit: Payer: Self-pay | Admitting: Cardiology

## 2018-12-22 ENCOUNTER — Ambulatory Visit: Payer: Medicare HMO

## 2018-12-22 DIAGNOSIS — I6529 Occlusion and stenosis of unspecified carotid artery: Secondary | ICD-10-CM

## 2018-12-22 DIAGNOSIS — I2510 Atherosclerotic heart disease of native coronary artery without angina pectoris: Secondary | ICD-10-CM

## 2018-12-22 NOTE — Telephone Encounter (Signed)
Pt calling back to follow up on this rx gabapentin (NEURONTIN) 600 MG (TID)  Also needs rosuvastatin (CRESTOR) 20 MG tablet   amLODipine (NORVASC) 10 MG tablet    Pt states he is now out of these meds and has called the pharmacy several times   [id number] [place], [place] [phone] (Phone) [phone] (Fax)

## 2018-12-23 ENCOUNTER — Other Ambulatory Visit: Payer: Self-pay | Admitting: Family Medicine

## 2018-12-23 MED ORDER — ROSUVASTATIN CALCIUM 20 MG PO TABS: 20.0000 mg | ORAL_TABLET | Freq: Every day | ORAL | 3 refills | Status: DC

## 2018-12-23 MED ORDER — AMLODIPINE BESYLATE 10 MG PO TABS: 10.0000 mg | ORAL_TABLET | Freq: Every day | ORAL | 3 refills | Status: DC

## 2018-12-24 ENCOUNTER — Encounter (HOSPITAL_COMMUNITY): Payer: BLUE CROSS/BLUE SHIELD

## 2018-12-24 ENCOUNTER — Ambulatory Visit (INDEPENDENT_AMBULATORY_CARE_PROVIDER_SITE_OTHER): Payer: BLUE CROSS/BLUE SHIELD | Admitting: Endocrinology

## 2018-12-24 ENCOUNTER — Encounter: Payer: Self-pay | Admitting: Endocrinology

## 2018-12-24 VITALS — BP 132/60 | HR 78 | Ht 75.0 in | Wt 207.6 lb

## 2018-12-24 DIAGNOSIS — N183 Chronic kidney disease, stage 3 unspecified: Secondary | ICD-10-CM

## 2018-12-24 DIAGNOSIS — E1122 Type 2 diabetes mellitus with diabetic chronic kidney disease: Secondary | ICD-10-CM | POA: Diagnosis not present

## 2018-12-24 DIAGNOSIS — Z794 Long term (current) use of insulin: Secondary | ICD-10-CM

## 2018-12-24 LAB — POCT GLYCOSYLATED HEMOGLOBIN (HGB A1C): Hemoglobin A1C: 7.2 % — AB (ref 4.0–5.6)

## 2018-12-24 MED ORDER — FLUCONAZOLE 100 MG PO TABS: 100.0000 mg | ORAL_TABLET | Freq: Every day | ORAL | 2 refills | Status: DC

## 2018-12-24 MED ORDER — FREESTYLE LIBRE 14 DAY SENSOR MISC: 1.0000 | Freq: Every day | 11 refills | Status: DC

## 2018-12-24 MED ORDER — REPAGLINIDE 0.5 MG PO TABS: 0.5000 mg | ORAL_TABLET | Freq: Two times a day (BID) | ORAL | 11 refills | Status: DC

## 2018-12-24 NOTE — Patient Instructions (Addendum)
I have sent a prescription to your pharmacy, for a pill to help the symptoms at the private area.   good diet and exercise significantly improve the control of your diabetes.  please let me know if you wish to be referred to a dietician.  high blood sugar is very risky to your health.  you should see an eye doctor and dentist every year.  It is very important to get all recommended vaccinations.  Controlling your blood pressure and cholesterol drastically reduces the damage diabetes does to your body.  Those who smoke should quit.  Please discuss these with your doctor.  Please continue to use your continuous glucose monitor. I have sent a prescription to your pharmacy, to change the insulin to "repaglinide," with breakfast and supper.  Skip it if the blood sugar is below 100.  A different type of diabetes blood test is requested for you today.  We'll let you know about the results.   Please come back for a follow-up appointment in 2 months.

## 2018-12-24 NOTE — Progress Notes (Signed)
Subjective:    Patient ID: William Webb [facility], male    DOB: Feb 14, 1951, 68 y.o.   MRN: [id number]  HPI pt is referred by Dr Volanda Napoleon, for diabetes.  Pt states DM was dx'ed in 1989/01/04; he has moderate neuropathy of the lower extremities, and assoc pain; he also has CAD, PDR, and renal insuff; he has been on insulin since 01/04/11; pt says his diet and exercise are not good; he has never had pancreatitis, pancreatic surgery, severe hypoglycemia (except in the hospital), or DKA.  He recently took prednisone for COPD exac.  He sometimes skips the insulin, due to glucose of approx 100.   He brings a record of his cbg's which I have reviewed today.  cbg varies from 125-253.  There is no trend throughout the day.  Past Medical History:  Diagnosis Date  . Anemia   . CKD (chronic kidney disease) stage 3, GFR 30-59 ml/min (HCC)   . Coronary artery disease   . Diabetic peripheral neuropathy ([place])   . GERD (gastroesophageal reflux disease)   . Hyperlipidemia   . Hypertension   . Hypertensive crisis 10/16/2018  . Noncompliance with medication regimen   . Osteoarthritis    "legs, back" (10/16/2018)  . Pneumonia Jan 05, 2016   "real bad; I died and they had to bring me back" (10/16/2018)  . Seasonal allergies   . Type II diabetes mellitus ([place])     Past Surgical History:  Procedure Laterality Date  . CARDIAC CATHETERIZATION  10/20/2018  . CORONARY BALLOON ANGIOPLASTY N/A 10/20/2018   Procedure: CORONARY BALLOON ANGIOPLASTY;  Surgeon: Nigel Mormon, MD;  Location: [facility];  Service: Cardiovascular;  Laterality: N/A;  . JOINT REPLACEMENT    . LEFT HEART CATH AND CORONARY ANGIOGRAPHY N/A 10/20/2018   Procedure: LEFT HEART CATH AND CORONARY ANGIOGRAPHY;  Surgeon: Nigel Mormon, MD;  Location: [facility];  Service: Cardiovascular;  Laterality: N/A;  . TOTAL KNEE ARTHROPLASTY Right     Social History   Socioeconomic History  . Marital status: Legally Separated    Spouse name:  Not on file  . Number of children: Not on file  . Years of education: Not on file  . Highest education level: Not on file  Occupational History  . Not on file  Social Needs  . Financial resource strain: Not on file  . Food insecurity:    Worry: Not on file    Inability: Not on file  . Transportation needs:    Medical: Not on file    Non-medical: Not on file  Tobacco Use  . Smoking status: Former Smoker    Packs/day: 0.33    Years: 20.00    Pack years: 6.60    Types: Cigarettes    Last attempt to quit: 1985    Years since quitting: 35.1  . Smokeless tobacco: Never Used  Substance and Sexual Activity  . Alcohol use: Yes    Comment: 10/16/2018 "couple drinks/year"  . Drug use: Never  . Sexual activity: Not Currently  Lifestyle  . Physical activity:    Days per week: Not on file    Minutes per session: Not on file  . Stress: Not on file  Relationships  . Social connections:    Talks on phone: Not on file    Gets together: Not on file    Attends religious service: Not on file    Active member of club or organization: Not on file    Attends meetings of  clubs or organizations: Not on file    Relationship status: Not on file  . Intimate partner violence:    Fear of current or ex partner: Not on file    Emotionally abused: Not on file    Physically abused: Not on file    Forced sexual activity: Not on file  Other Topics Concern  . Not on file  Social History Narrative  . Not on file    Current Outpatient Medications on File Prior to Visit  Medication Sig Dispense Refill  . acetaminophen (TYLENOL) 500 MG tablet Take 2 tablets (1,000 mg total) by mouth 2 (two) times daily as needed for moderate pain. 30 tablet 0  . amLODipine (NORVASC) 10 MG tablet Take 1 tablet (10 mg total) by mouth daily. 90 tablet 3  . aspirin EC 81 MG tablet Take 1 tablet (81 mg total) by mouth daily. 90 tablet 2  . Blood Glucose Monitoring Suppl (ACCU-CHEK GUIDE ME) w/Device KIT 1 Device by Does  not apply route 4 (four) times daily -  before meals and at bedtime. 1 kit 0  . cholecalciferol (VITAMIN D) 25 MCG (1000 UT) tablet Take 1,000 Units by mouth daily.     . Continuous Blood Gluc Receiver (FREESTYLE LIBRE READER) DEVI 1 Device by Does not apply route as directed. 1 Device 0  . Cyanocobalamin 2500 MCG CHEW Chew 2,500 mcg by mouth daily.    . feeding supplement, GLUCERNA SHAKE, (GLUCERNA SHAKE) LIQD Take 237 mLs by mouth 3 (three) times daily between meals.  0  . furosemide (LASIX) 20 MG tablet TAKE 2 TABLETS BY MOUTH IN THE MORNING AND ADDITIONAL 1 TABLET IN AFTERNOON,IF PERSISTENT LEG EDEMA 90 tablet 0  . gabapentin (NEURONTIN) 600 MG tablet TAKE 2 TABLETS BY MOUTH 3 TIMES A DAY 180 tablet 0  . hydrALAZINE (APRESOLINE) 100 MG tablet Take 1 tablet (100 mg total) by mouth every 8 (eight) hours. 90 tablet 1  . Insulin Pen Needle (BD PEN NEEDLE NANO U/F) 32G X 4 MM MISC USE AS DIRECTED FOR TRESIBA INJECTIONS ONCE DAILY 90 each 1  . isosorbide mononitrate (IMDUR) 30 MG 24 hr tablet Take 30 mg by mouth daily.    Marland Kitchen linaclotide (LINZESS) 145 MCG CAPS capsule Take 1 capsule (145 mcg total) by mouth daily as needed (constipation). (Patient taking differently: Take 145 mcg by mouth daily as needed (for constipation). ) 30 capsule 0  . methocarbamol (ROBAXIN) 500 MG tablet TAKE 1 TABLET BY MOUTH TWICE DAILY AS NEEDED FOR MUSCLE SPASM (Patient taking differently: Take 500 mg by mouth 2 (two) times daily as needed for muscle spasms. ) 60 tablet 1  . metoprolol tartrate 75 MG TABS Take 75 mg by mouth daily. 30 tablet 0  . montelukast (SINGULAIR) 10 MG tablet Take 1 tablet (10 mg total) by mouth at bedtime. (Patient taking differently: Take 10 mg by mouth every evening. ) 90 tablet 3  . omeprazole (PRILOSEC) 20 MG capsule Take 20 mg by mouth every evening.     . rosuvastatin (CRESTOR) 20 MG tablet Take 1 tablet (20 mg total) by mouth daily at 6 PM. 90 tablet 3  . vitamin E (VITAMIN E) 1000 UNIT capsule  Take 1,000 Units by mouth daily.      No current facility-administered medications on file prior to visit.     No Known Allergies  Family History  Problem Relation Age of Onset  . Hypertension Mother   . Diabetes Mother   . Hyperlipidemia Mother   .  Hypertension Father   . Hypertension Sister   . Cancer Sister     BP 132/60 (BP Location: Left Arm, Patient Position: Sitting, Cuff Size: Normal)   Pulse 78   Ht 6' 3"  (1.905 m)   Wt 207 lb 9.6 oz (94.2 kg)   SpO2 93%   BMI 25.95 kg/m     Review of Systems denies headache, chest pain, sob, n/v, muscle cramps, excessive diaphoresis, memory loss, and cold intolerance.  He has lost 20 lbs x a few mos, due to illnesses.  No change in chronic easy bruising, visual loss, rhinorrhea, or chronic depression.  He attributes polyuria to lasix.  He has penile swelling and irritation.      Objective:   Physical Exam VS: see vs page GEN: no distress HEAD: head: no deformity eyes: no periorbital swelling, no proptosis external nose and ears are normal mouth: no lesion seen NECK: supple, thyroid is not enlarged CHEST WALL: no deformity LUNGS: clear to auscultation CV: reg rate and rhythm, no murmur ABD: abdomen is soft, nontender.  no hepatosplenomegaly.  not distended.  Self-reducing ventral hernia.   GENITALIA: Normal male testicles, scrotum; penis has slight swelling and erythema distally.   MUSCULOSKELETAL: muscle bulk and strength are grossly normal.  no obvious joint swelling.  gait is normal and steady.  Old healed surgical scar at the right knee (TKR).   EXTEMITIES: no deformity.  no ulcer on the feet.  feet are of normal color and temp.  1+ bilat leg edema.  There is bilateral onychomycosis of the toenails PULSES: dorsalis pedis intact bilat.  no carotid bruit NEURO:  cn 2-12 grossly intact.   readily moves all 4's.  sensation is intact to touch on the feet, but decreased from normal SKIN:  Normal texture and temperature.  No  rash or suspicious lesion is visible.   NODES:  None palpable at the neck PSYCH: alert, well-oriented.  Does not appear anxious nor depressed.   Lab Results  Component Value Date   HGBA1C 7.3 (H) 12/11/2018   Lab Results  Component Value Date   CREATININE 2.21 (H) 12/11/2018   BUN 32 (H) 12/11/2018   NA 139 12/11/2018   K 4.4 12/11/2018   CL 107 12/11/2018   CO2 22 12/11/2018        Assessment & Plan:  Insulin-requiring type 2 DM, with renal failure: he can have a trial off insulin.   Transfusion: this could artificially lower the a1c Lean body habitus: I told pt he might be developing type 1, so we'll have a low threshold for resuming insulin.  Balanitis, new to me.   Patient Instructions  I have sent a prescription to your pharmacy, for a pill to help the symptoms at the private area.   good diet and exercise significantly improve the control of your diabetes.  please let me know if you wish to be referred to a dietician.  high blood sugar is very risky to your health.  you should see an eye doctor and dentist every year.  It is very important to get all recommended vaccinations.  Controlling your blood pressure and cholesterol drastically reduces the damage diabetes does to your body.  Those who smoke should quit.  Please discuss these with your doctor.  Please continue to use your continuous glucose monitor. I have sent a prescription to your pharmacy, to change the insulin to "repaglinide," with breakfast and supper.  Skip it if the blood sugar is below 100.  A  different type of diabetes blood test is requested for you today.  We'll let you know about the results.   Please come back for a follow-up appointment in 2 months.

## 2018-12-26 LAB — FRUCTOSAMINE: Fructosamine: 255 umol/L (ref 205–285)

## 2018-12-28 NOTE — Progress Notes (Signed)
Patient is here for follow up visit.  Subjective:   William Webb [facility], male    DOB: [birth year], 68 y.o.   MRN: [id number]   No chief complaint on file.    HPI  68 y/o [place] male with uncontrolled hypertension, type 2 DM, hyperlipidemia, CKD stage III/V,CAD s/p prior PCI, NSTEMI in 09/2018, attempted but unsuccessful PTCA to chronically occluded Rt PDA.  Patient was admitted tp [facility] hospital in January 13, 2019 with near syncope, felt to be neurocardiogenic in etiology. Anemia was thought to be possible underlying etiology. Catheryn Bacon was stopped.  He was treated with IV orn and blood transfusion, with outpatient EGD scheduked with GI. He subsequently had another admission with what was thought to be viral pneumonia.  He has not had any recent chest pain, shortness of breath. His biggest complaint today is erectile dysfunction. He is currently on Imdur 30 mg, but is not using hydralazine.   Past Medical History:  Diagnosis Date  . Anemia   . CKD (chronic kidney disease) stage 3, GFR 30-59 ml/min (HCC)   . Coronary artery disease   . Diabetic peripheral neuropathy ([place])   . GERD (gastroesophageal reflux disease)   . Hyperlipidemia   . Hypertension   . Hypertensive crisis 10/16/2018  . Noncompliance with medication regimen   . Osteoarthritis    "legs, back" (10/16/2018)  . Pneumonia 2016-01-14   "real bad; I died and they had to bring me back" (10/16/2018)  . Seasonal allergies   . Type II diabetes mellitus ([place])      Past Surgical History:  Procedure Laterality Date  . CARDIAC CATHETERIZATION  10/20/2018  . CORONARY BALLOON ANGIOPLASTY N/A 10/20/2018   Procedure: CORONARY BALLOON ANGIOPLASTY;  Surgeon: Nigel Mormon, MD;  Location: [facility];  Service: Cardiovascular;  Laterality: N/A;  . JOINT REPLACEMENT    . LEFT HEART CATH AND CORONARY ANGIOGRAPHY N/A 10/20/2018   Procedure: LEFT HEART CATH AND CORONARY ANGIOGRAPHY;  Surgeon: Nigel Mormon, MD;  Location: [facility];  Service: Cardiovascular;  Laterality: N/A;  . TOTAL KNEE ARTHROPLASTY Right      Social History   Socioeconomic History  . Marital status: Legally Separated    Spouse name: Not on file  . Number of children: Not on file  . Years of education: Not on file  . Highest education level: Not on file  Occupational History  . Not on file  Social Needs  . Financial resource strain: Not on file  . Food insecurity:    Worry: Not on file    Inability: Not on file  . Transportation needs:    Medical: Not on file    Non-medical: Not on file  Tobacco Use  . Smoking status: Former Smoker    Packs/day: 0.33    Years: 20.00    Pack years: 6.60    Types: Cigarettes    Last attempt to quit: 1985    Years since quitting: 35.1  . Smokeless tobacco: Never Used  Substance and Sexual Activity  . Alcohol use: Yes    Comment: 10/16/2018 "couple drinks/year"  . Drug use: Never  . Sexual activity: Not Currently  Lifestyle  . Physical activity:    Days per week: Not on file    Minutes per session: Not on file  . Stress: Not on file  Relationships  . Social connections:    Talks on phone: Not on file    Gets together: Not on file  Attends religious service: Not on file    Active member of club or organization: Not on file    Attends meetings of clubs or organizations: Not on file    Relationship status: Not on file  . Intimate partner violence:    Fear of current or ex partner: Not on file    Emotionally abused: Not on file    Physically abused: Not on file    Forced sexual activity: Not on file  Other Topics Concern  . Not on file  Social History Narrative  . Not on file     Current Outpatient Medications on File Prior to Visit  Medication Sig Dispense Refill  . acetaminophen (TYLENOL) 500 MG tablet Take 2 tablets (1,000 mg total) by mouth 2 (two) times daily as needed for moderate pain. 30 tablet 0  . amLODipine (NORVASC) 10 MG tablet Take  1 tablet (10 mg total) by mouth daily. 90 tablet 3  . aspirin EC 81 MG tablet Take 1 tablet (81 mg total) by mouth daily. 90 tablet 2  . Blood Glucose Monitoring Suppl (ACCU-CHEK GUIDE ME) w/Device KIT 1 Device by Does not apply route 4 (four) times daily -  before meals and at bedtime. 1 kit 0  . cholecalciferol (VITAMIN D) 25 MCG (1000 UT) tablet Take 1,000 Units by mouth daily.     . Continuous Blood Gluc Receiver (FREESTYLE LIBRE READER) DEVI 1 Device by Does not apply route as directed. 1 Device 0  . Continuous Blood Gluc Sensor (FREESTYLE LIBRE 14 DAY SENSOR) MISC 1 each by Does not apply route daily. 2 each 11  . Cyanocobalamin 2500 MCG CHEW Chew 2,500 mcg by mouth daily.    . feeding supplement, GLUCERNA SHAKE, (GLUCERNA SHAKE) LIQD Take 237 mLs by mouth 3 (three) times daily between meals.  0  . furosemide (LASIX) 20 MG tablet TAKE 2 TABLETS BY MOUTH IN THE MORNING AND ADDITIONAL 1 TABLET IN AFTERNOON,IF PERSISTENT LEG EDEMA 90 tablet 0  . gabapentin (NEURONTIN) 600 MG tablet TAKE 2 TABLETS BY MOUTH 3 TIMES A DAY 180 tablet 0  . insulin degludec (TRESIBA FLEXTOUCH) 100 UNIT/ML SOPN FlexTouch Pen Inject into the skin as needed.    . Insulin Pen Needle (BD PEN NEEDLE NANO U/F) 32G X 4 MM MISC USE AS DIRECTED FOR TRESIBA INJECTIONS ONCE DAILY 90 each 1  . linaclotide (LINZESS) 145 MCG CAPS capsule Take 1 capsule (145 mcg total) by mouth daily as needed (constipation). (Patient taking differently: Take 145 mcg by mouth daily as needed (for constipation). ) 30 capsule 0  . methocarbamol (ROBAXIN) 500 MG tablet TAKE 1 TABLET BY MOUTH TWICE DAILY AS NEEDED FOR MUSCLE SPASM (Patient taking differently: Take 500 mg by mouth 2 (two) times daily as needed for muscle spasms. ) 60 tablet 1  . montelukast (SINGULAIR) 10 MG tablet Take 1 tablet (10 mg total) by mouth at bedtime. (Patient taking differently: Take 10 mg by mouth every evening. ) 90 tablet 3  . omeprazole (PRILOSEC) 20 MG capsule Take 20 mg by  mouth every evening.     . repaglinide (PRANDIN) 0.5 MG tablet Take 1 tablet (0.5 mg total) by mouth 2 (two) times daily before a meal. 60 tablet 11  . rosuvastatin (CRESTOR) 20 MG tablet Take 1 tablet (20 mg total) by mouth daily at 6 PM. 90 tablet 3  . vitamin E (VITAMIN E) 1000 UNIT capsule Take 1,000 Units by mouth daily.      No current facility-administered  medications on file prior to visit.     Cardiovascular studies:  Coronary angiogram 10/20/2018: LM: Normal LAD: Distal 70% diffuse disease.        Ostial 90% stenosis in Diag1. Patent prox Diag 1 stent LCx: Large OM1 with patent prior stent RCA: 100% occluded RPDA        Partially successful PTCA attempt        100%--99% stenosis        TIMI flow 0-I  LVEDP 16 mmHg  Echocardiogram 10/17/2018:  Left ventricle: The cavity size was normal. Wall thickness was   increased in a pattern of severe LVH. Systolic function was   normal. The estimated ejection fraction was in the range of 50%   to 55%. Wall motion was normal; there were no regional wall   motion abnormalities. Doppler parameters are consistent with   abnormal left ventricular relaxation (grade 1 diastolic   dysfunction). Doppler parameters are consistent with high   ventricular filling pressure. - Left atrium: The atrium was moderately dilated. - Right atrium: The atrium was mildly dilated.  Impressions:  - Low normal to mildly reduced LV systolic function; EF 50; severe   LVH; mild diastolic dysfunction; biatrial enlargement.  Review of Systems  Constitution: Negative for decreased appetite, malaise/fatigue, weight gain and weight loss.  HENT: Negative for congestion.   Eyes: Negative for visual disturbance.  Cardiovascular: Negative for chest pain, dyspnea on exertion, leg swelling, palpitations and syncope.  Respiratory: Negative for shortness of breath.   Endocrine: Negative for cold intolerance.  Hematologic/Lymphatic: Does not bruise/bleed easily.    Skin: Negative for itching and rash.  Musculoskeletal: Negative for myalgias.  Gastrointestinal: Negative for abdominal pain, nausea and vomiting.  Genitourinary: Negative for dysuria.       Erectile dysfunction  Neurological: Negative for dizziness and weakness.  Psychiatric/Behavioral: The patient is not nervous/anxious.   All other systems reviewed and are negative.      Objective:    Vitals:   12/29/18 1449  BP: (!) 157/87  Pulse: 70  SpO2: 97%     Physical Exam  Constitutional: He is oriented to person, place, and time. He appears well-developed and well-nourished. No distress.  HENT:  Head: Normocephalic and atraumatic.  Eyes: Pupils are equal, round, and reactive to light. Conjunctivae are normal.  Neck: No JVD present.  Cardiovascular: Normal rate and regular rhythm.  No murmur heard. Pulses:      Dorsalis pedis pulses are 0 on the left side.       Posterior tibial pulses are 1+ on the right side and 1+ on the left side.  Pulmonary/Chest: Effort normal and breath sounds normal. He has no wheezes. He has no rales.  Abdominal: Soft. Bowel sounds are normal. There is no rebound.  Musculoskeletal:        General: No edema.  Lymphadenopathy:    He has no cervical adenopathy.  Neurological: He is alert and oriented to person, place, and time. No cranial nerve deficit.  Skin: Skin is warm and dry.  Psychiatric: He has a normal mood and affect.  Nursing note and vitals reviewed.       Assessment & Recommendations:   68 y/o [place] male with uncontrolled hypertension, type 2 DM, hyperlipidemia, CKD stage III/V,CAD s/p prior PCI, NSTEMI in 09/2018, attempted but unsuccessful PTCA to chronically occluded Rt PDA.  CAD: Stable without angina symptoms. Given his advanced CKD, I do not recommend further revascualrization attempts. Brilinta stopped due to concern for upper  GI bleed. Continue Aspirin, if possible.  Given his wishes to use PDE5 inhibitor for ED,  I have stopped hi Imdur and hydralazine. Increased metoprolol tartarate to 75 mg bid.  Mild bilateral carotid stenosis: Continue medical management  Advanced CKD: Follow up with nephrology.   Nigel Mormon, MD [facility] [place] Pager: [phone] Office: [phone] If no answer Cell [phone]

## 2018-12-29 ENCOUNTER — Encounter: Payer: Self-pay | Admitting: Cardiology

## 2018-12-29 ENCOUNTER — Ambulatory Visit (INDEPENDENT_AMBULATORY_CARE_PROVIDER_SITE_OTHER): Payer: BC Managed Care – PPO | Admitting: Cardiology

## 2018-12-29 VITALS — BP 157/87 | HR 70 | Ht 75.0 in | Wt 201.0 lb

## 2018-12-29 DIAGNOSIS — N529 Male erectile dysfunction, unspecified: Secondary | ICD-10-CM | POA: Diagnosis not present

## 2018-12-29 DIAGNOSIS — I10 Essential (primary) hypertension: Secondary | ICD-10-CM | POA: Diagnosis not present

## 2018-12-29 DIAGNOSIS — I2510 Atherosclerotic heart disease of native coronary artery without angina pectoris: Secondary | ICD-10-CM | POA: Diagnosis not present

## 2018-12-29 MED ORDER — SILDENAFIL CITRATE 50 MG PO TABS: 50.0000 mg | ORAL_TABLET | Freq: Every day | ORAL | 2 refills | Status: DC | PRN

## 2018-12-29 MED ORDER — METOPROLOL TARTRATE 75 MG PO TABS: 75.0000 mg | ORAL_TABLET | Freq: Two times a day (BID) | ORAL | 3 refills | Status: DC

## 2018-12-30 ENCOUNTER — Encounter: Payer: Self-pay | Admitting: Nurse Practitioner

## 2018-12-30 ENCOUNTER — Other Ambulatory Visit (INDEPENDENT_AMBULATORY_CARE_PROVIDER_SITE_OTHER): Payer: BLUE CROSS/BLUE SHIELD

## 2018-12-30 ENCOUNTER — Ambulatory Visit (INDEPENDENT_AMBULATORY_CARE_PROVIDER_SITE_OTHER): Payer: BLUE CROSS/BLUE SHIELD | Admitting: Nurse Practitioner

## 2018-12-30 VITALS — BP 160/80 | HR 79 | Ht 75.0 in | Wt 210.4 lb

## 2018-12-30 DIAGNOSIS — D649 Anemia, unspecified: Secondary | ICD-10-CM

## 2018-12-30 LAB — CBC
HCT: 29.2 % — ABNORMAL LOW (ref 39.0–52.0)
Hemoglobin: 9.9 g/dL — ABNORMAL LOW (ref 13.0–17.0)
MCHC: 33.9 g/dL (ref 30.0–36.0)
MCV: 87.6 fl (ref 78.0–100.0)
Platelets: 201 10*3/uL (ref 150.0–400.0)
RBC: 3.33 Mil/uL — ABNORMAL LOW (ref 4.22–5.81)
RDW: 15.8 % — ABNORMAL HIGH (ref 11.5–15.5)
WBC: 5 10*3/uL (ref 4.0–10.5)

## 2018-12-30 NOTE — Patient Instructions (Signed)
Your provider has requested that you go to the basement level for lab work before leaving today. Press "B" on the elevator. The lab is located at the first door on the left as you exit the elevator.  If you are age 69 or older, your body mass index should be between 23-30. Your Body mass index is 26.3 kg/m. If this is out of the aforementioned range listed, please consider follow up with your Primary Care Provider.  If you are age 83 or younger, your body mass index should be between 19-25. Your Body mass index is 26.3 kg/m. If this is out of the aformentioned range listed, please consider follow up with your Primary Care Provider.

## 2018-12-30 NOTE — Progress Notes (Signed)
Chief Complaint:    Hospital follow up for anemia  IMPRESSION and PLAN:     1. 68 yo recently admitted with acute on chronic anemia ( 1.5 gram drop in hgb) on Brillinta. It was not clear to [place] if he actually had a GI bleed or not. Iron studies c/w anemia of chronic disease. Hgb responded to a unit of blood. EGD was not performed inpatient. Patient was already being considered for outpatient EGD with dilation for evaluation of dysphagia and abnormal barium swallow.  -Stool brown, no evidence for any overt GI bleeding. Cardiology has anticoagulant.  -repeat CBC today. Of note, patient is not taking oral iron due to constipation side effects.  -If hgb has declined, consider EGD.   2. Dysphagia. 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 Prior to admission early February plan was for EGD with dilation but for unclear reasons the dysphagia has completely resolved. No swallowing issues for last few weeks.Marland Kitchen Appetite is great, he has gained a few pounds -Even though patient is no longer anticoagulated he is still at high risk for procedures. Given total resolution of dysphagia will hold off on EGD at this point unless Dr. Havery Moros feels strongly about proceeding. Otherwise advised patient to call us if he has any recurrent swallowing problems.   3. Colon cancer screening. On 10 year recall for Aug 2023  4. CAD s/p prior PCI, NSTEMI in Dec 2019. PTCA unsuccessful, he has a chronically occluded right PDA. Saw Cardiology yesterday in follow up, given advanced kidney disease no further revascularization attempts recommended.   5. CKD 3  HPI:     Patient is a 68 yo male with history of CKD3, DM2 and significant CAD. He is known to Dr. Havery Moros and was last seen early January for evaluation of dysphagia.  Due to significant cardiac disease including NSTEMI in December 2019,  a barium swallow was  scheduled in lieu of EGD. 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 No stricture seen. Based on findings the plan was for EGD with dilation if Cardiology felt it reasonable to hold anticoagulant. However, a couple of weeks later patient was treated by ED for multifocal PNA. Less than a week later  On 12/01/18 he was admitted for syncope and acute on chronic anemia.   12/01/18 - admission for symptomatic anemia / dark stool. Reported passage of a black stool in the hospital , flushed before staff could observe it. Iron studies c/w anemia of chronic disease. His hgb responded to a unit of PRBCs. Hemoccults ordered but never collected. . As of 12/12/18 hgb was stable at 8.6. He isn't really taking iron as it exacerbates his constipation.    Patient here for hospital follow up. Brillinta has been discontinued. Stools are brown. He takes linzess as needed. Patient has no GI complaints. He is no longer having dysphagia. Can eat bread, meat, etc without any difficulty. Has been able to gain back a few pounds.   Review of systems:     No chest pain, no SOB, no fevers, no urinary sx   Past Medical History:  Diagnosis Date  . Anemia   . CKD (chronic kidney disease) stage 3, GFR 30-59 ml/min (HCC)   . Coronary artery disease   . Diabetic peripheral neuropathy ([place])   . GERD (gastroesophageal reflux disease)   .  Hyperlipidemia   . Hypertension   . Hypertensive crisis 10/16/2018  . Noncompliance with medication regimen   . Osteoarthritis    "legs, back" (10/16/2018)  . Pneumonia 12/20/2015   "real bad; I died and they had to bring me back" (10/16/2018)  . Seasonal allergies   . Type II diabetes mellitus ([place])     Patient's surgical history, family medical history, social history, medications and allergies were all reviewed in Epic   Serum creatinine: 2.21 mg/dL (H) 12/11/18 0425 Estimated  creatinine clearance: 37.7 mL/min (A)  Current Outpatient Medications  Medication Sig Dispense Refill  . acetaminophen (TYLENOL) 500 MG tablet Take 2 tablets (1,000 mg total) by mouth 2 (two) times daily as needed for moderate pain. 30 tablet 0  . amLODipine (NORVASC) 10 MG tablet Take 1 tablet (10 mg total) by mouth daily. 90 tablet 3  . aspirin EC 81 MG tablet Take 1 tablet (81 mg total) by mouth daily. 90 tablet 2  . Blood Glucose Monitoring Suppl (ACCU-CHEK GUIDE ME) w/Device KIT 1 Device by Does not apply route 4 (four) times daily -  before meals and at bedtime. 1 kit 0  . cholecalciferol (VITAMIN D) 25 MCG (1000 UT) tablet Take 1,000 Units by mouth daily.     . Continuous Blood Gluc Receiver (FREESTYLE LIBRE READER) DEVI 1 Device by Does not apply route as directed. 1 Device 0  . Continuous Blood Gluc Sensor (FREESTYLE LIBRE 14 DAY SENSOR) MISC 1 each by Does not apply route daily. 2 each 11  . Cyanocobalamin 2500 MCG CHEW Chew 2,500 mcg by mouth daily.    . feeding supplement, GLUCERNA SHAKE, (GLUCERNA SHAKE) LIQD Take 237 mLs by mouth 3 (three) times daily between meals.  0  . furosemide (LASIX) 20 MG tablet TAKE 2 TABLETS BY MOUTH IN THE MORNING AND ADDITIONAL 1 TABLET IN AFTERNOON,IF PERSISTENT LEG EDEMA 90 tablet 0  . gabapentin (NEURONTIN) 600 MG tablet TAKE 2 TABLETS BY MOUTH 3 TIMES A DAY 180 tablet 0  . Insulin Pen Needle (BD PEN NEEDLE NANO U/F) 32G X 4 MM MISC USE AS DIRECTED FOR TRESIBA INJECTIONS ONCE DAILY 90 each 1  . linaclotide (LINZESS) 145 MCG CAPS capsule Take 1 capsule (145 mcg total) by mouth daily as needed (constipation). (Patient taking differently: Take 145 mcg by mouth daily as needed (for constipation). ) 30 capsule 0  . methocarbamol (ROBAXIN) 500 MG tablet TAKE 1 TABLET BY MOUTH TWICE DAILY AS NEEDED FOR MUSCLE SPASM (Patient taking differently: Take 500 mg by mouth 2 (two) times daily as needed for muscle spasms. ) 60 tablet 1  . Metoprolol Tartrate 75 MG TABS  Take 75 mg by mouth 2 (two) times daily. 60 tablet 3  . montelukast (SINGULAIR) 10 MG tablet Take 1 tablet (10 mg total) by mouth at bedtime. (Patient taking differently: Take 10 mg by mouth every evening. ) 90 tablet 3  . omeprazole (PRILOSEC) 20 MG capsule Take 20 mg by mouth every evening.     . repaglinide (PRANDIN) 0.5 MG tablet Take 1 tablet (0.5 mg total) by mouth 2 (two) times daily before a meal. 60 tablet 11  . rosuvastatin (CRESTOR) 20 MG tablet Take 1 tablet (20 mg total) by mouth daily at 6 PM. 90 tablet 3  . sildenafil (VIAGRA) 50 MG tablet Take 1 tablet (50 mg total) by mouth daily as needed for erectile dysfunction. 30 tablet 2  . vitamin E (VITAMIN E) 1000 UNIT capsule Take 1,000 Units by  mouth daily.      No current facility-administered medications for this visit.     Physical Exam:     BP (!) 160/80   Pulse 79   Ht _0  (1.905 m)   Wt 210 lb 6.4 oz (95.4 kg)   SpO2 98%   BMI 26.30 kg/m   GENERAL:  Pleasant male in NAD PSYCH: : Cooperative, normal affect EENT:  conjunctiva pink, mucous membranes moist, neck supple without masses CARDIAC:  RRR, no peripheral edema PULM: Normal respiratory effort, lungs CTA bilaterally, no wheezing ABDOMEN:  Nondistended, soft, nontender. No obvious masses, no hepatomegaly,  normal bowel sounds SKIN:  turgor, no lesions seen Musculoskeletal:  Normal muscle tone, normal strength NEURO: Alert and oriented x 3, no focal neurologic deficits   Tye Savoy , NP 12/30/2018, 3:03 PM

## 2018-12-31 NOTE — Progress Notes (Signed)
Agree with assessment with the following thoughts. It appears the patient reportedly had a dark stool with associated anemia and needed a transfusion. Unclear if he truly had an upper GI bleed but symptoms are concerning for it. While he has had Brilinta held, I'm assuming cardiology would want to resume this if possible given he had a recent PCI. From the bleeding perspective, it may be good to touch base with Dr. Virgina Jock, his cardiologist about this case and his desire to resume Brilinta soon. If okay from their perspective we could do an EGD in the near future and if the exam is negative they may want to resume Brilinta.  Paula let's touch base about this patient, I will send a message to Dr. Virgina Jock.

## 2018-12-31 NOTE — Progress Notes (Signed)
Beth please tell the patient that his hemoglobin is improving.   I spoke with Dr. Havery Moros and he may still want to  proceed with an upper endoscopy since patient is off [place].  I know that his swallowing has improved but the barium swallow in January was abnormal there is still a question of whether not he had a GI bleed in the hospital recently.  Dr. Havery Moros will be touching base with patient's Cardiologist and we will let patient know. Thanks

## 2019-01-01 ENCOUNTER — Telehealth: Payer: Self-pay

## 2019-01-01 NOTE — Progress Notes (Signed)
I communicated with Dr. Virgina Jock. The patient has had anemia with concern for possible GI bleeding in the setting of Brilinta. Of note, no stent was placed during recent cardiac cath, he has a severe occlusion of an artery not amenable to PCI, but EF normal. Their plan was to continue Brilinta and aspirin ideally long term if the patient tolerated it. With the anemia and question of GI bleeding while on Brilinta with need to continue it in the future, endoscopic workup at this time is reasonable. I would recommend doing both an EGD and colonoscopy to evaluate this at one time in order to only sedate him once, as his last colonoscopy was 6 years ago. If these look okay may resume Brilinta and consider a capsule pending his course.   Beth can you help coordinate EGD and colonoscopy in first available opening at the [facility]. Thanks. He can continue his aspirin during this time.   Nicholaus Bloom

## 2019-01-01 NOTE — Telephone Encounter (Signed)
Thanks [employer]. If he wishes to do these sooner so he can be cleared to resume Brilinta I am happy to add him in if any openings available. Thanks

## 2019-01-01 NOTE — Telephone Encounter (Signed)
Progress Notes by Yetta Flock, MD at 12/30/2018 2:30 PM  Author: Yetta Flock, MD Author Type: Physician Filed: 01/01/2019 7:55 AM  Note Status: Signed Cosign: Cosign Not Required Encounter Date: 12/30/2018  Editor: Yetta Flock, MD (Physician)    I communicated with Dr. Virgina Jock. The patient has had anemia with concern for possible GI bleeding in the setting of Brilinta. Of note, no stent was placed during recent cardiac cath, he has a severe occlusion of an artery not amenable to PCI, but EF normal. Their plan was to continue Brilinta and aspirin ideally long term if the patient tolerated it. With the anemia and question of GI bleeding while on Brilinta with need to continue it in the future, endoscopic workup at this time is reasonable. I would recommend doing both an EGD and colonoscopy to evaluate this at one time in order to only sedate him once, as his last colonoscopy was 6 years ago. If these look okay may resume Brilinta and consider a capsule pending his course.   Beth can you help coordinate EGD and colonoscopy in first available opening at the [facility]. Thanks. He can continue his aspirin during this time.       Patient contacted. He says he is not on Brilinta and is agreeable to having both procedures done.  Previsit on 01/20/19 EGD and colon on 01/28/19

## 2019-01-02 NOTE — Telephone Encounter (Signed)
No earlier openings available at this time.

## 2019-01-12 ENCOUNTER — Encounter: Payer: Self-pay | Admitting: Family Medicine

## 2019-01-12 ENCOUNTER — Other Ambulatory Visit: Payer: Self-pay

## 2019-01-12 ENCOUNTER — Ambulatory Visit (INDEPENDENT_AMBULATORY_CARE_PROVIDER_SITE_OTHER): Payer: BC Managed Care – PPO | Admitting: Family Medicine

## 2019-01-12 VITALS — BP 140/74 | HR 74 | Temp 98.0°F | Wt 210.0 lb

## 2019-01-12 DIAGNOSIS — N183 Chronic kidney disease, stage 3 unspecified: Secondary | ICD-10-CM

## 2019-01-12 DIAGNOSIS — E1122 Type 2 diabetes mellitus with diabetic chronic kidney disease: Secondary | ICD-10-CM

## 2019-01-12 DIAGNOSIS — I10 Essential (primary) hypertension: Secondary | ICD-10-CM | POA: Diagnosis not present

## 2019-01-12 DIAGNOSIS — Z794 Long term (current) use of insulin: Secondary | ICD-10-CM

## 2019-01-12 MED ORDER — METHOCARBAMOL 500 MG PO TABS: ORAL_TABLET | ORAL | 1 refills | Status: DC

## 2019-01-12 NOTE — Patient Instructions (Signed)
Managing Your Hypertension Hypertension is commonly called high blood pressure. This is when the force of your blood pressing against the walls of your arteries is too strong. Arteries are blood vessels that carry blood from your heart throughout your body. Hypertension forces the heart to work harder to pump blood, and may cause the arteries to become narrow or stiff. Having untreated or uncontrolled hypertension can cause heart attack, stroke, kidney disease, and other problems. What are blood pressure readings? A blood pressure reading consists of a higher number over a lower number. Ideally, your blood pressure should be below 120/80. The first ("top") number is called the systolic pressure. It is a measure of the pressure in your arteries as your heart beats. The second ("bottom") number is called the diastolic pressure. It is a measure of the pressure in your arteries as the heart relaxes. What does my blood pressure reading mean? Blood pressure is classified into four stages. Based on your blood pressure reading, your health care provider may use the following stages to determine what type of treatment you need, if any. Systolic pressure and diastolic pressure are measured in a unit called mm Hg. Normal  Systolic pressure: below 078.  Diastolic pressure: below 80. Elevated  Systolic pressure: 675-449.  Diastolic pressure: below 80. Hypertension stage 1  Systolic pressure: 201-007.  Diastolic pressure: 12-19. Hypertension stage 2  Systolic pressure: 758 or above.  Diastolic pressure: 90 or above. What health risks are associated with hypertension? Managing your hypertension is an important responsibility. Uncontrolled hypertension can lead to:  A heart attack.  A stroke.  A weakened blood vessel (aneurysm).  Heart failure.  Kidney damage.  Eye damage.  Metabolic syndrome.  Memory and concentration problems. What changes can I make to manage my  hypertension? Hypertension can be managed by making lifestyle changes and possibly by taking medicines. Your health care provider will help you make a plan to bring your blood pressure within a normal range. Eating and drinking   Eat a diet that is high in fiber and potassium, and low in salt (sodium), added sugar, and fat. An example eating plan is called the DASH (Dietary Approaches to Stop Hypertension) diet. To eat this way: ? Eat plenty of fresh fruits and vegetables. Try to fill half of your plate at each meal with fruits and vegetables. ? Eat whole grains, such as whole wheat pasta, brown rice, or whole grain bread. Fill about one quarter of your plate with whole grains. ? Eat low-fat diary products. ? Avoid fatty cuts of meat, processed or cured meats, and poultry with skin. Fill about one quarter of your plate with lean proteins such as fish, chicken without skin, beans, eggs, and tofu. ? Avoid premade and processed foods. These tend to be higher in sodium, added sugar, and fat.  Reduce your daily sodium intake. Most people with hypertension should eat less than 1,500 mg of sodium a day.  Limit alcohol intake to no more than 1 drink a day for nonpregnant women and 2 drinks a day for men. One drink equals 12 oz of beer, 5 oz of wine, or 1 oz of hard liquor. Lifestyle  Work with your health care provider to maintain a healthy body weight, or to lose weight. Ask what an ideal weight is for you.  Get at least 30 minutes of exercise that causes your heart to beat faster (aerobic exercise) most days of the week. Activities may include walking, swimming, or biking.  Include exercise  to strengthen your muscles (resistance exercise), such as weight lifting, as part of your weekly exercise routine. Try to do these types of exercises for 30 minutes at least 3 days a week.  Do not use any products that contain nicotine or tobacco, such as cigarettes and e-cigarettes. If you need help quitting,  ask your health care provider.  Control any long-term (chronic) conditions you have, such as high cholesterol or diabetes. Monitoring  Monitor your blood pressure at home as told by your health care provider. Your personal target blood pressure may vary depending on your medical conditions, your age, and other factors.  Have your blood pressure checked regularly, as often as told by your health care provider. Working with your health care provider  Review all the medicines you take with your health care provider because there may be side effects or interactions.  Talk with your health care provider about your diet, exercise habits, and other lifestyle factors that may be contributing to hypertension.  Visit your health care provider regularly. Your health care provider can help you create and adjust your plan for managing hypertension. Will I need medicine to control my blood pressure? Your health care provider may prescribe medicine if lifestyle changes are not enough to get your blood pressure under control, and if:  Your systolic blood pressure is 130 or higher.  Your diastolic blood pressure is 80 or higher. Take medicines only as told by your health care provider. Follow the directions carefully. Blood pressure medicines must be taken as prescribed. The medicine does not work as well when you skip doses. Skipping doses also puts you at risk for problems. Contact a health care provider if:  You think you are having a reaction to medicines you have taken.  You have repeated (recurrent) headaches.  You feel dizzy.  You have swelling in your ankles.  You have trouble with your vision. Get help right away if:  You develop a severe headache or confusion.  You have unusual weakness or numbness, or you feel faint.  You have severe pain in your chest or abdomen.  You vomit repeatedly.  You have trouble breathing. Summary  Hypertension is when the force of blood pumping  through your arteries is too strong. If this condition is not controlled, it may put you at risk for serious complications.  Your personal target blood pressure may vary depending on your medical conditions, your age, and other factors. For most people, a normal blood pressure is less than 120/80.  Hypertension is managed by lifestyle changes, medicines, or both. Lifestyle changes include weight loss, eating a healthy, low-sodium diet, exercising more, and limiting alcohol. This information is not intended to replace advice given to you by your health care provider. Make sure you discuss any questions you have with your health care provider. Document Released: 07/09/2012 Document Revised: 09/12/2016 Document Reviewed: 09/12/2016 Elsevier Interactive Patient Education  2019 [employer].  Diabetes Mellitus and Nutrition, Adult When you have diabetes (diabetes mellitus), it is very important to have healthy eating habits because your blood sugar (glucose) levels are greatly affected by what you eat and drink. Eating healthy foods in the appropriate amounts, at about the same times every day, can help you:  Control your blood glucose.  Lower your risk of heart disease.  Improve your blood pressure.  Reach or maintain a healthy weight. Every person with diabetes is different, and each person has different needs for a meal plan. Your health care provider may recommend  that you work with a diet and nutrition specialist (dietitian) to make a meal plan that is best for you. Your meal plan may vary depending on factors such as:  The calories you need.  The medicines you take.  Your weight.  Your blood glucose, blood pressure, and cholesterol levels.  Your activity level.  Other health conditions you have, such as heart or kidney disease. How do carbohydrates affect me? Carbohydrates, also called carbs, affect your blood glucose level more than any other type of food. Eating carbs naturally  raises the amount of glucose in your blood. Carb counting is a method for keeping track of how many carbs you eat. Counting carbs is important to keep your blood glucose at a healthy level, especially if you use insulin or take certain oral diabetes medicines. It is important to know how many carbs you can safely have in each meal. This is different for every person. Your dietitian can help you calculate how many carbs you should have at each meal and for each snack. Foods that contain carbs include:  Bread, cereal, rice, pasta, and crackers.  Potatoes and corn.  Peas, beans, and lentils.  Milk and yogurt.  Fruit and juice.  Desserts, such as cakes, cookies, ice cream, and candy. How does alcohol affect me? Alcohol can cause a sudden decrease in blood glucose (hypoglycemia), especially if you use insulin or take certain oral diabetes medicines. Hypoglycemia can be a life-threatening condition. Symptoms of hypoglycemia (sleepiness, dizziness, and confusion) are similar to symptoms of having too much alcohol. If your health care provider says that alcohol is safe for you, follow these guidelines:  Limit alcohol intake to no more than 1 drink per day for nonpregnant women and 2 drinks per day for men. One drink equals 12 oz of beer, 5 oz of wine, or 1 oz of hard liquor.  Do not drink on an empty stomach.  Keep yourself hydrated with water, diet soda, or unsweetened iced tea.  Keep in mind that regular soda, juice, and other mixers may contain a lot of sugar and must be counted as carbs. What are tips for following this plan?  Reading food labels  Start by checking the serving size on the "Nutrition Facts" label of packaged foods and drinks. The amount of calories, carbs, fats, and other nutrients listed on the label is based on one serving of the item. Many items contain more than one serving per package.  Check the total grams (g) of carbs in one serving. You can calculate the number  of servings of carbs in one serving by dividing the total carbs by 15. For example, if a food has 30 g of total carbs, it would be equal to 2 servings of carbs.  Check the number of grams (g) of saturated and trans fats in one serving. Choose foods that have low or no amount of these fats.  Check the number of milligrams (mg) of salt (sodium) in one serving. Most people should limit total sodium intake to less than 2,300 mg per day.  Always check the nutrition information of foods labeled as "low-fat" or "nonfat". These foods may be higher in added sugar or refined carbs and should be avoided.  Talk to your dietitian to identify your daily goals for nutrients listed on the label. Shopping  Avoid buying canned, premade, or processed foods. These foods tend to be high in fat, sodium, and added sugar.  Shop around the outside edge of the grocery store.  This includes fresh fruits and vegetables, bulk grains, fresh meats, and fresh dairy. Cooking  Use low-heat cooking methods, such as baking, instead of high-heat cooking methods like deep frying.  Cook using healthy oils, such as olive, canola, or sunflower oil.  Avoid cooking with butter, cream, or high-fat meats. Meal planning  Eat meals and snacks regularly, preferably at the same times every day. Avoid going long periods of time without eating.  Eat foods high in fiber, such as fresh fruits, vegetables, beans, and whole grains. Talk to your dietitian about how many servings of carbs you can eat at each meal.  Eat 4-6 ounces (oz) of lean protein each day, such as lean meat, chicken, fish, eggs, or tofu. One oz of lean protein is equal to: ? 1 oz of meat, chicken, or fish. ? 1 egg. ?  cup of tofu.  Eat some foods each day that contain healthy fats, such as avocado, nuts, seeds, and fish. Lifestyle  Check your blood glucose regularly.  Exercise regularly as told by your health care provider. This may include: ? 150 minutes of  moderate-intensity or vigorous-intensity exercise each week. This could be brisk walking, biking, or water aerobics. ? Stretching and doing strength exercises, such as yoga or weightlifting, at least 2 times a week.  Take medicines as told by your health care provider.  Do not use any products that contain nicotine or tobacco, such as cigarettes and e-cigarettes. If you need help quitting, ask your health care provider.  Work with a Social worker or diabetes educator to identify strategies to manage stress and any emotional and social challenges. Questions to ask a health care provider  Do I need to meet with a diabetes educator?  Do I need to meet with a dietitian?  What number can I call if I have questions?  When are the best times to check my blood glucose? Where to find more information:  American Diabetes Association: diabetes.org  Academy of Nutrition and Dietetics: www.eatright.[employer] of Diabetes and Digestive and Kidney Diseases (NIH): [employer] Summary  A healthy meal plan will help you control your blood glucose and maintain a healthy lifestyle.  Working with a diet and nutrition specialist (dietitian) can help you make a meal plan that is best for you.  Keep in mind that carbohydrates (carbs) and alcohol have immediate effects on your blood glucose levels. It is important to count carbs and to use alcohol carefully. This information is not intended to replace advice given to you by your health care provider. Make sure you discuss any questions you have with your health care provider. Document Released: 07/12/2005 Document Revised: 05/15/2017 Document Reviewed: 11/19/2016 Elsevier Interactive Patient Education  2019 [employer].

## 2019-01-12 NOTE — Progress Notes (Signed)
Subjective:    Patient ID: William Webb [facility], male    DOB: 1951-09-08, 68 y.o.   MRN: [id number]  No chief complaint on file.   HPI Patient was seen today for follow-up.  States has been doing well since his daughter went back to [place].  Pt states he is back in his apt and the issues with mold have been resolved.  Pt states HH's last day was Feb 05, 2023.  Pt states HH PT comes late and has only been staying 15 mins.  Pt states he has yet to get the [facility] meter that he requested.  Pt states he started eating better.  Pt states he also needs to get cataract surgery.  Pt was given viagra by another provider.  Past Medical History:  Diagnosis Date  . Anemia   . CKD (chronic kidney disease) stage 3, GFR 30-59 ml/min (HCC)   . Coronary artery disease   . Diabetic peripheral neuropathy ([place])   . GERD (gastroesophageal reflux disease)   . Hyperlipidemia   . Hypertension   . Hypertensive crisis 10/16/2018  . Noncompliance with medication regimen   . Osteoarthritis    "legs, back" (10/16/2018)  . Pneumonia 02/05/16   "real bad; I died and they had to bring me back" (10/16/2018)  . Seasonal allergies   . Type II diabetes mellitus (HCC)     No Known Allergies  ROS General: Denies fever, chills, night sweats, changes in weight, changes in appetite HEENT: Denies headaches, ear pain, changes in vision, rhinorrhea, sore throat CV: Denies CP, palpitations, SOB, orthopnea Pulm: Denies SOB, cough, wheezing GI: Denies abdominal pain, nausea, vomiting, diarrhea, constipation GU: Denies dysuria, hematuria, frequency, vaginal discharge Msk: Denies muscle cramps, joint pains Neuro: Denies weakness, numbness, tingling Skin: Denies rashes, bruising Psych: Denies depression, anxiety, hallucinations    Objective:    Blood pressure 140/74, pulse 74, temperature 98 F (36.7 C), temperature source Oral, weight 210 lb (95.3 kg), SpO2 98 %.  Gen. Pleasant, well-nourished, in no distress,  normal affect   HEENT: [place]/AT, face symmetric, no scleral icterus, PERRLA, nares patent without drainage Lungs: no accessory muscle use, CTAB, no wheezes or rales Cardiovascular: RRR, no m/r/g, no peripheral edema Neuro:  A&Ox3, CN II-XII intact, normal gait Skin:  Warm, no lesions/ rash  Wt Readings from Last 3 Encounters:  12/30/18 210 lb 6.4 oz (95.4 kg)  12/29/18 201 lb (91.2 kg)  12/24/18 207 lb 9.6 oz (94.2 kg)    Lab Results  Component Value Date   WBC 5.0 12/30/2018   HGB 9.9 (L) 12/30/2018   HCT 29.2 (L) 12/30/2018   PLT 201.0 12/30/2018   GLUCOSE 186 (H) 12/11/2018   CHOL 162 03/06/2018   TRIG 95.0 03/06/2018   HDL 37.60 (L) 03/06/2018   LDLCALC 105 (H) 03/06/2018   ALT 10 12/11/2018   AST 15 12/11/2018   NA 139 12/11/2018   K 4.4 12/11/2018   CL 107 12/11/2018   CREATININE 2.21 (H) 12/11/2018   BUN 32 (H) 12/11/2018   CO2 22 12/11/2018   INR 1.07 11/19/2017   HGBA1C 7.2 (A) 12/24/2018    Assessment/Plan:  Essential hypertension -elevated.  Will recheck -continue metoprolol tartrate 75 mg twice daily, Norvasc 10 mg, Lasix 40 mg -We will contact home health again in regards to medication management. -continue lifestyle modifications  Type 2 diabetes mellitus with stage 3 chronic kidney disease, with long-term current use of insulin (HCC) -Hemoglobin A1c 7.2% on 12/24/2018 -Continue current medications including Repaglinide -Continue  checking FSBS regularly and keeping a log to bring with him to clinic -Discussed the importance of lifestyle modifications  F/u prn in the next 3-4 months  [name], MD

## 2019-01-14 ENCOUNTER — Other Ambulatory Visit: Payer: Self-pay | Admitting: Family Medicine

## 2019-01-15 ENCOUNTER — Encounter: Payer: Self-pay | Admitting: Family Medicine

## 2019-01-15 ENCOUNTER — Other Ambulatory Visit: Payer: Self-pay | Admitting: Cardiology

## 2019-01-16 ENCOUNTER — Telehealth (HOSPITAL_COMMUNITY): Payer: Self-pay

## 2019-01-16 NOTE — Telephone Encounter (Signed)
Pt insurance is active and benefits verified through [employer] $10.00, DED 0/0 met, out of pocket $3,400/0 met, co-insurance 0%. no pre-authorization required. Passport, 01/16/2019 @ 11:23am, REF# [id number]  Will contact patient to see if he is interested in the Cardiac Rehab Program. If interested, patient will need to complete follow up appt. Once completed, patient will be contacted for scheduling upon review by the RN Navigator. Tedra Senegal. Support Rep II

## 2019-01-16 NOTE — Telephone Encounter (Signed)
Called and spoke with pt in regards to CR, adv we have recv'd the pt referral. And at this time we are not scheduling due to the COVID-19. Once we have resume scheduling we will contact the pt. She/He verbalized understanding. °Gloria W. Support Rep II °

## 2019-01-18 ENCOUNTER — Encounter: Payer: Self-pay | Admitting: *Deleted

## 2019-01-18 ENCOUNTER — Telehealth: Payer: Self-pay | Admitting: *Deleted

## 2019-01-18 NOTE — Telephone Encounter (Signed)
Covid-19 travel screening questions  Have you traveled in the last 14 days? no If yes where?  Do you now or have you had a fever in the last 14 days? no  Do you have any respiratory symptoms of shortness of breath or cough now or in the last 14 days? no  Do you have a medical history of Congestive Heart Failure? no  Do you have a medical history of lung disease? no  Do you have any family members or close contacts with diagnosed or suspected Covid-19? no       

## 2019-01-19 ENCOUNTER — Encounter: Payer: Self-pay | Admitting: Gastroenterology

## 2019-01-19 ENCOUNTER — Other Ambulatory Visit: Payer: Self-pay

## 2019-01-19 ENCOUNTER — Ambulatory Visit (AMBULATORY_SURGERY_CENTER): Payer: Self-pay

## 2019-01-19 VITALS — Temp 98.3°F | Ht 75.0 in | Wt 219.0 lb

## 2019-01-19 DIAGNOSIS — D649 Anemia, unspecified: Secondary | ICD-10-CM

## 2019-01-19 MED ORDER — NA SULFATE-K SULFATE-MG SULF 17.5-3.13-1.6 GM/177ML PO SOLN: 1.0000 | Freq: Once | ORAL | 0 refills | Status: AC

## 2019-01-19 NOTE — Progress Notes (Signed)
Denies allergies to eggs or soy products. Denies complication of anesthesia or sedation. Denies use of weight loss medication. Denies use of O2.   Emmi instructions declined.  

## 2019-01-21 ENCOUNTER — Other Ambulatory Visit: Payer: Self-pay | Admitting: Family Medicine

## 2019-01-22 ENCOUNTER — Other Ambulatory Visit: Payer: Self-pay | Admitting: Family Medicine

## 2019-01-26 NOTE — Telephone Encounter (Signed)
Pt does not need Aspirin, gets OTC. States that he needs refill on his Gabapentin

## 2019-01-28 ENCOUNTER — Encounter: Payer: BLUE CROSS/BLUE SHIELD | Admitting: Gastroenterology

## 2019-01-30 ENCOUNTER — Telehealth: Payer: Self-pay | Admitting: Family Medicine

## 2019-01-30 NOTE — Telephone Encounter (Signed)
Copied from [place] [id number]. Topic: Quick Communication - Home Health Verbal Orders >> Jan 30, 2019 12:17 PM Rutherford Nail, [place] wrote: Caller/Agency: Almyra Free, RN Case Manager with [facility] Number: [phone] (Can leave a voicemail) Requesting OT/PT/Skilled Nursing/Social Work/Speech Therapy: Nursing Frequency:  2x a week for 2 weeks 1x a week for 4 weeks 2 PRN

## 2019-02-02 NOTE — Telephone Encounter (Signed)
Ok.  What exactly is a clear nutrition list?  Pt is followed by Endo.  Pt was taken off Imdur by Cardiology at the beginning of March so he could take viagra.

## 2019-02-02 NOTE — Telephone Encounter (Signed)
Per Almyra Free, please change to 2x a week for 3wks 1x a week for 4 wks and 2 PRN. Also needs updated medlist to clarify whether he is on Imdur. Fax:[phone]. Also wants to know if Dr. Volanda Napoleon can provide a clear nutrition list to manage diabetes?

## 2019-02-02 NOTE — Telephone Encounter (Signed)
Please Advise

## 2019-02-03 NOTE — Telephone Encounter (Signed)
Spoke with Almyra Free with [facility] verbalized understanding of Dr Volanda Napoleon advice Sharlyne Pacas orders

## 2019-02-04 ENCOUNTER — Encounter (INDEPENDENT_AMBULATORY_CARE_PROVIDER_SITE_OTHER): Payer: Self-pay | Admitting: Ophthalmology

## 2019-02-06 ENCOUNTER — Telehealth: Payer: Self-pay | Admitting: Family Medicine

## 2019-02-06 NOTE — Telephone Encounter (Signed)
Copied from [place] [id number]. Topic: Quick Communication - See Telephone Encounter >> Feb 06, 2019  2:54 PM Blase Mess A wrote: CRM for notification. See Telephone encounter for: 02/06/19. Almyra Free from [facility] is calling to notify Dr. Volanda Napoleon that the patient missed his visit today.  He is visiting with family in [place].  She will try again next week. Thank you

## 2019-02-09 NOTE — Telephone Encounter (Signed)
Ok

## 2019-02-10 ENCOUNTER — Telehealth: Payer: Self-pay | Admitting: Family Medicine

## 2019-02-10 NOTE — Telephone Encounter (Unsigned)
Copied from [place] [id number]. Topic: Quick Communication - Home Health Verbal Orders >> Feb 10, 2019  2:41 PM Carolyn Stare wrote: Vinnie Level with [facility] call to say pt missed last Friday 02/06/2019 and she has a new frequency   [phone]    Frequency has been changed :  1 x 5

## 2019-02-11 NOTE — Telephone Encounter (Signed)
FYI

## 2019-02-11 NOTE — Telephone Encounter (Signed)
Left a VM for William Webb with [facility] regarding approval for new frequency for pt missed visits

## 2019-02-11 NOTE — Telephone Encounter (Signed)
ok 

## 2019-02-18 ENCOUNTER — Telehealth (HOSPITAL_COMMUNITY): Payer: Self-pay | Admitting: *Deleted

## 2019-02-18 NOTE — Telephone Encounter (Signed)
Called and spoke to pt regarding Cardiac Rehab continued closure due to adherence of national recommendation for Covid -19 in group setting.  Pt verbalized understanding and remains interested in participating.  Pt admits reluctance to walk outside due to virus concerns and pollen. Will send pt educational information on warm up, cool down, band and weight exercises.  Pt appreciative. Cherre Huger, BSN Cardiac and Training and development officer

## 2019-02-20 ENCOUNTER — Telehealth: Payer: Self-pay | Admitting: Family Medicine

## 2019-02-20 NOTE — Telephone Encounter (Signed)
Copied from [place] [phone]. Topic: Quick Communication - See Telephone Encounter >> Feb 20, 2019  3:12 PM Robina Ade, Helene Kelp D wrote: CRM for notification. See Telephone encounter for: 02/20/19. Almyra Free with [facility] home [facility] called and wanted to let Dr. Volanda Napoleon that she went to visit patient but he wasn't there again today.If any question she can be reached at [phone].

## 2019-02-24 ENCOUNTER — Ambulatory Visit: Payer: BLUE CROSS/BLUE SHIELD | Admitting: Endocrinology

## 2019-02-24 ENCOUNTER — Other Ambulatory Visit: Payer: Self-pay

## 2019-02-24 ENCOUNTER — Other Ambulatory Visit: Payer: Self-pay | Admitting: Cardiology

## 2019-02-24 DIAGNOSIS — J302 Other seasonal allergic rhinitis: Secondary | ICD-10-CM

## 2019-02-24 MED ORDER — MONTELUKAST SODIUM 10 MG PO TABS: 10.0000 mg | ORAL_TABLET | Freq: Every day | ORAL | 3 refills | Status: DC

## 2019-02-24 NOTE — Telephone Encounter (Signed)
Ok

## 2019-02-24 NOTE — Telephone Encounter (Signed)
FYI

## 2019-02-27 ENCOUNTER — Telehealth: Payer: Self-pay | Admitting: Family Medicine

## 2019-02-27 NOTE — Telephone Encounter (Signed)
Noted  

## 2019-02-27 NOTE — Telephone Encounter (Signed)
FYI

## 2019-02-27 NOTE — Telephone Encounter (Signed)
Copied from [place] [phone]. Topic: Quick Communication - See Telephone Encounter >> Feb 27, 2019 12:26 PM Rutherford Nail, [place] wrote: CRM for notification. See Telephone encounter for: 02/27/19. Almyra Free, Case Manager, with [facility] calling and states that she spoke with the patient last night and he does not want Home health services any longer. States that it had been a few weeks since she had seen him.   CB#: [id number]

## 2019-03-05 ENCOUNTER — Encounter: Payer: Self-pay | Admitting: Endocrinology

## 2019-03-05 ENCOUNTER — Other Ambulatory Visit: Payer: Self-pay

## 2019-03-05 ENCOUNTER — Ambulatory Visit (INDEPENDENT_AMBULATORY_CARE_PROVIDER_SITE_OTHER): Payer: Medicare HMO | Admitting: Endocrinology

## 2019-03-05 VITALS — BP 150/70 | HR 75 | Ht 75.0 in | Wt 211.2 lb

## 2019-03-05 DIAGNOSIS — Z794 Long term (current) use of insulin: Secondary | ICD-10-CM

## 2019-03-05 DIAGNOSIS — N183 Chronic kidney disease, stage 3 unspecified: Secondary | ICD-10-CM

## 2019-03-05 DIAGNOSIS — E1122 Type 2 diabetes mellitus with diabetic chronic kidney disease: Secondary | ICD-10-CM | POA: Diagnosis not present

## 2019-03-05 DIAGNOSIS — E113599 Type 2 diabetes mellitus with proliferative diabetic retinopathy without macular edema, unspecified eye: Secondary | ICD-10-CM | POA: Diagnosis not present

## 2019-03-05 DIAGNOSIS — J449 Chronic obstructive pulmonary disease, unspecified: Secondary | ICD-10-CM | POA: Diagnosis not present

## 2019-03-05 LAB — POCT GLYCOSYLATED HEMOGLOBIN (HGB A1C): Hemoglobin A1C: 7.8 % — AB (ref 4.0–5.6)

## 2019-03-05 MED ORDER — TADALAFIL 20 MG PO TABS: 20.0000 mg | ORAL_TABLET | Freq: Every day | ORAL | 0 refills | Status: DC | PRN

## 2019-03-05 MED ORDER — REPAGLINIDE 0.5 MG PO TABS: 0.5000 mg | ORAL_TABLET | Freq: Three times a day (TID) | ORAL | 3 refills | Status: DC

## 2019-03-05 NOTE — Patient Instructions (Addendum)
Your blood pressure is high today.  Please see your primary care provider soon, to have it rechecked.  I have sent a prescription to your pharmacy, to increase the repaglinide to 3 times a day (just before each meal).  Skip it if the blood sugar is below 100 check your blood sugar once a day.  vary the time of day when you check, between before the 3 meals, and at bedtime.  also check if you have symptoms of your blood sugar being too high or too low.  please keep a record of the readings and bring it to your next appointment here (or you can bring the meter itself).  You can write it on any piece of paper.  please call us sooner if your blood sugar goes below 70, or if you have a lot of readings over 200. I have also sent a prescription to your pharmacy, to change viagra to cialis (does not interact with the repaglinide). Please come back for a follow-up appointment in 2-3 months.

## 2019-03-05 NOTE — Progress Notes (Signed)
Subjective:    Patient ID: William Webb [facility], male    DOB: 05/05/1951, 68 y.o.   MRN: [id number]  HPI Pt returns for f/u of diabetes mellitus: DM type: 2 Dx'ed: 3888 Complications: painful PN, CAD, PDR, and renal insuff.  Therapy: repaglinide  DKA: never Severe hypoglycemia: never Pancreatitis: never Pancreatic imaging: never Other: he took insulin 2012-2020; he intermittently takes prednisone for COPD; lean body habitus raises the possibility he is developing type 1; edema limits rx options.  Interval history: No recent blood transfusion.  He takes fe tabs.  no cbg record, but states cbg's vary from 110-200's.   Past Medical History:  Diagnosis Date  . Allergy   . Anemia   . Blood transfusion without reported diagnosis   . Cataract   . CHF (congestive heart failure) ([place])   . CKD (chronic kidney disease) stage 3, GFR 30-59 ml/min (HCC)   . Coronary artery disease   . Diabetic peripheral neuropathy ([place])   . GERD (gastroesophageal reflux disease)   . Hyperlipidemia   . Hypertension   . Hypertensive crisis 10/16/2018  . Myocardial infarction ([place])   . Noncompliance with medication regimen   . Osteoarthritis    "legs, back" (10/16/2018)  . Osteoporosis   . Pneumonia 2015/12/21   "real bad; I died and they had to bring me back" (10/16/2018)  . Seasonal allergies   . Type II diabetes mellitus ([place])     Past Surgical History:  Procedure Laterality Date  . CARDIAC CATHETERIZATION  10/20/2018  . CORONARY BALLOON ANGIOPLASTY N/A 10/20/2018   Procedure: CORONARY BALLOON ANGIOPLASTY;  Surgeon: Nigel Mormon, MD;  Location: [place] [facility];  Service: Cardiovascular;  Laterality: N/A;  . JOINT REPLACEMENT    . LEFT HEART CATH AND CORONARY ANGIOGRAPHY N/A 10/20/2018   Procedure: LEFT HEART CATH AND CORONARY ANGIOGRAPHY;  Surgeon: Nigel Mormon, MD;  Location: [place] [facility];  Service: Cardiovascular;  Laterality: N/A;  . TOTAL KNEE ARTHROPLASTY Right      Social History   Socioeconomic History  . Marital status: Legally Separated    Spouse name: Not on file  . Number of children: Not on file  . Years of education: Not on file  . Highest education level: Not on file  Occupational History  . Not on file  Social Needs  . Financial resource strain: Not on file  . Food insecurity:    Worry: Not on file    Inability: Not on file  . Transportation needs:    Medical: Not on file    Non-medical: Not on file  Tobacco Use  . Smoking status: Former Smoker    Packs/day: 0.33    Years: 20.00    Pack years: 6.60    Types: Cigarettes    Last attempt to quit: 1985    Years since quitting: 35.3  . Smokeless tobacco: Never Used  Substance and Sexual Activity  . Alcohol use: Yes    Comment: 10/16/2018 "couple drinks/year"  . Drug use: Never  . Sexual activity: Not Currently  Lifestyle  . Physical activity:    Days per week: Not on file    Minutes per session: Not on file  . Stress: Not on file  Relationships  . Social connections:    Talks on phone: Not on file    Gets together: Not on file    Attends religious service: Not on file    Active member of club or organization: Not on file  Attends meetings of clubs or organizations: Not on file    Relationship status: Not on file  . Intimate partner violence:    Fear of current or ex partner: Not on file    Emotionally abused: Not on file    Physically abused: Not on file    Forced sexual activity: Not on file  Other Topics Concern  . Not on file  Social History Narrative  . Not on file    Current Outpatient Medications on File Prior to Visit  Medication Sig Dispense Refill  . acetaminophen (TYLENOL) 500 MG tablet Take 2 tablets (1,000 mg total) by mouth 2 (two) times daily as needed for moderate pain. 30 tablet 0  . amLODipine (NORVASC) 10 MG tablet Take 1 tablet (10 mg total) by mouth daily. 90 tablet 3  . ASPIRIN LOW DOSE 81 MG EC tablet TAKE 1 TABLET BY MOUTH EVERY DAY 90  tablet 3  . Blood Glucose Monitoring Suppl (ACCU-CHEK GUIDE ME) w/Device KIT 1 Device by Does not apply route 4 (four) times daily -  before meals and at bedtime. 1 kit 0  . cholecalciferol (VITAMIN D) 25 MCG (1000 UT) tablet Take 1,000 Units by mouth daily.     . Continuous Blood Gluc Receiver (FREESTYLE LIBRE READER) DEVI 1 Device by Does not apply route as directed. 1 Device 0  . Continuous Blood Gluc Sensor (FREESTYLE LIBRE 14 DAY SENSOR) MISC 1 each by Does not apply route daily. 2 each 11  . Cyanocobalamin 2500 MCG CHEW Chew 2,500 mcg by mouth daily.    . feeding supplement, GLUCERNA SHAKE, (GLUCERNA SHAKE) LIQD Take 237 mLs by mouth 3 (three) times daily between meals.  0  . furosemide (LASIX) 20 MG tablet TAKE 2 TABLETS BY MOUTH IN THE MORNING AND ADDITIONAL 1 TABLET IN AFTERNOON,IF PERSISTENT LEG EDEMA 90 tablet 0  . gabapentin (NEURONTIN) 600 MG tablet TAKE 2 TABLETS BY MOUTH 3 TIMES A DAY 180 tablet 3  . linaclotide (LINZESS) 145 MCG CAPS capsule Take 1 capsule (145 mcg total) by mouth daily as needed (constipation). (Patient taking differently: Take 145 mcg by mouth daily as needed (for constipation). ) 30 capsule 0  . methocarbamol (ROBAXIN) 500 MG tablet TAKE 1 TABLET BY MOUTH TWICE DAILY AS NEEDED FOR MUSCLE SPASM 120 tablet 1  . metoprolol tartrate (LOPRESSOR) 50 MG tablet TAKE 1 TABLET BY MOUTH TWICE A DAY 60 tablet 1  . Metoprolol Tartrate 75 MG TABS Take 75 mg by mouth 2 (two) times daily. 60 tablet 3  . montelukast (SINGULAIR) 10 MG tablet Take 1 tablet (10 mg total) by mouth at bedtime. 90 tablet 3  . omeprazole (PRILOSEC) 20 MG capsule Take 20 mg by mouth every evening.     . rosuvastatin (CRESTOR) 20 MG tablet TAKE 1 TABLET (20 MG TOTAL) BY MOUTH DAILY AT 6 PM. 90 tablet 1  . vitamin E (VITAMIN E) 1000 UNIT capsule Take 1,000 Units by mouth daily.      No current facility-administered medications on file prior to visit.     No Known Allergies  Family History  Problem  Relation Age of Onset  . Hypertension Mother   . Diabetes Mother   . Hyperlipidemia Mother   . Hypertension Father   . Hypertension Sister   . Cancer Sister   . Colon cancer Brother   . Esophageal cancer Neg Hx   . Stomach cancer Neg Hx   . Rectal cancer Neg Hx     BP Marland Kitchen)  150/70 (BP Location: Left Arm, Patient Position: Sitting, Cuff Size: Large)   Pulse 75   Ht _0  (1.905 m)   Wt 211 lb 3.2 oz (95.8 kg)   SpO2 95%   BMI 26.40 kg/m    Review of Systems He denies hypoglycemia    Objective:   Physical Exam VITAL SIGNS:  See vs page GENERAL: no distress Pulses: dorsalis pedis intact bilat.   MSK: no deformity of the feet CV: 1+ bilat leg edema Skin:  no ulcer on the feet.  normal color and temp on the feet. Neuro: sensation is intact to touch on the feet, but decreased from normal.   Ext: There is bilateral onychomycosis of the toenails    Lab Results  Component Value Date   HGBA1C 7.8 (A) 03/05/2019    Lab Results  Component Value Date   CREATININE 2.21 (H) 12/11/2018   BUN 32 (H) 12/11/2018   NA 139 12/11/2018   K 4.4 12/11/2018   CL 107 12/11/2018   CO2 22 12/11/2018       Assessment & Plan:  Type 2 DM, with PDR: he needs increased rx.  renal failure: This limits rx options HTN: is noted today ED: there is an interaction between viagra and repaglinide.   Edema: This limits rx options  Patient Instructions  Your blood pressure is high today.  Please see your primary care provider soon, to have it rechecked.  I have sent a prescription to your pharmacy, to increase the repaglinide to 3 times a day (just before each meal).  Skip it if the blood sugar is below 100 check your blood sugar once a day.  vary the time of day when you check, between before the 3 meals, and at bedtime.  also check if you have symptoms of your blood sugar being too high or too low.  please keep a record of the readings and bring it to your next appointment here (or you can bring  the meter itself).  You can write it on any piece of paper.  please call us sooner if your blood sugar goes below 70, or if you have a lot of readings over 200. I have also sent a prescription to your pharmacy, to change viagra to cialis (does not interact with the repaglinide). Please come back for a follow-up appointment in 2-3 months.

## 2019-03-20 IMAGING — CR DG CHEST 2V
2 series · 2 of 2 positions shown · non-contrast
Comparison: 11/25/2018

CLINICAL DATA: Heartburn, weakness, multiple falls, and nausea for
1 day. Former smoker. History of diabetes and hypertension.

EXAM:
CHEST - 2 VIEW

[chest lat]
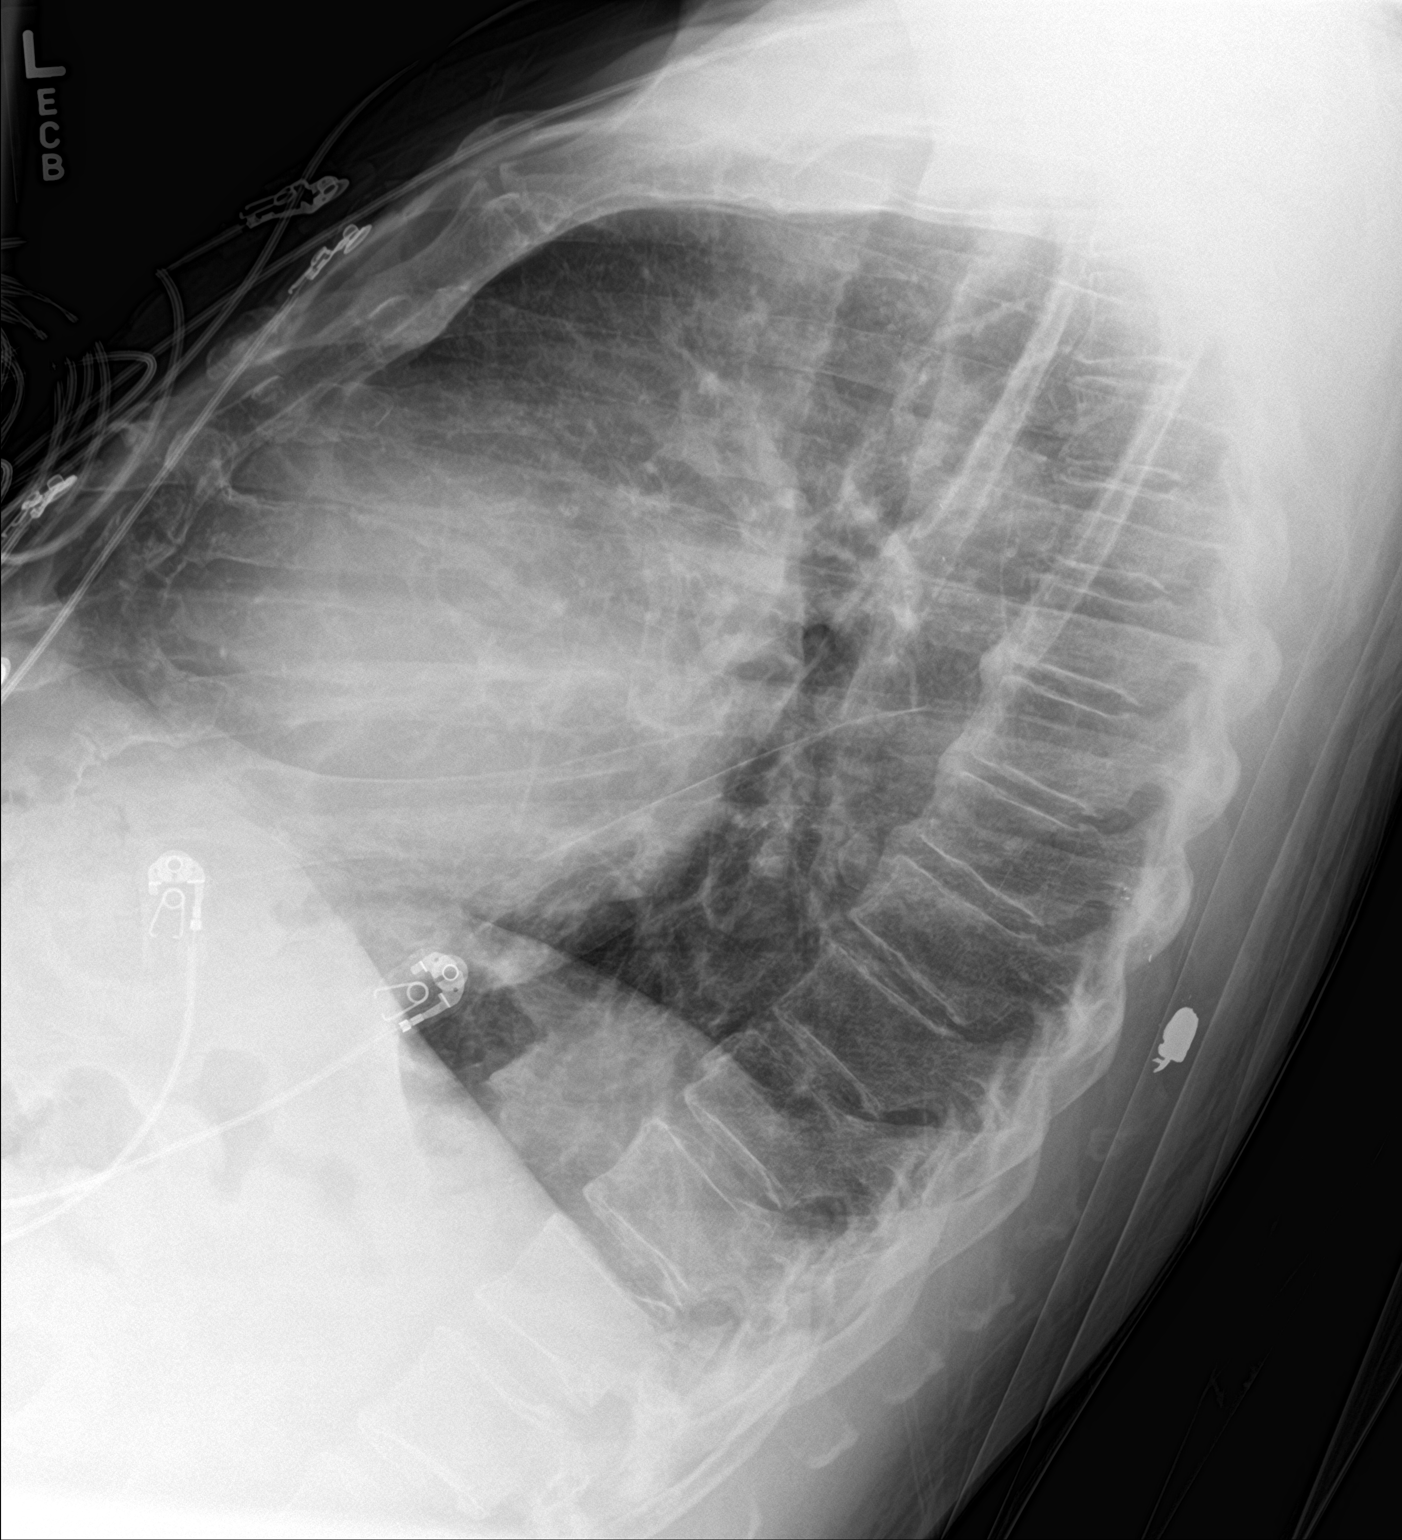

[chest ap]
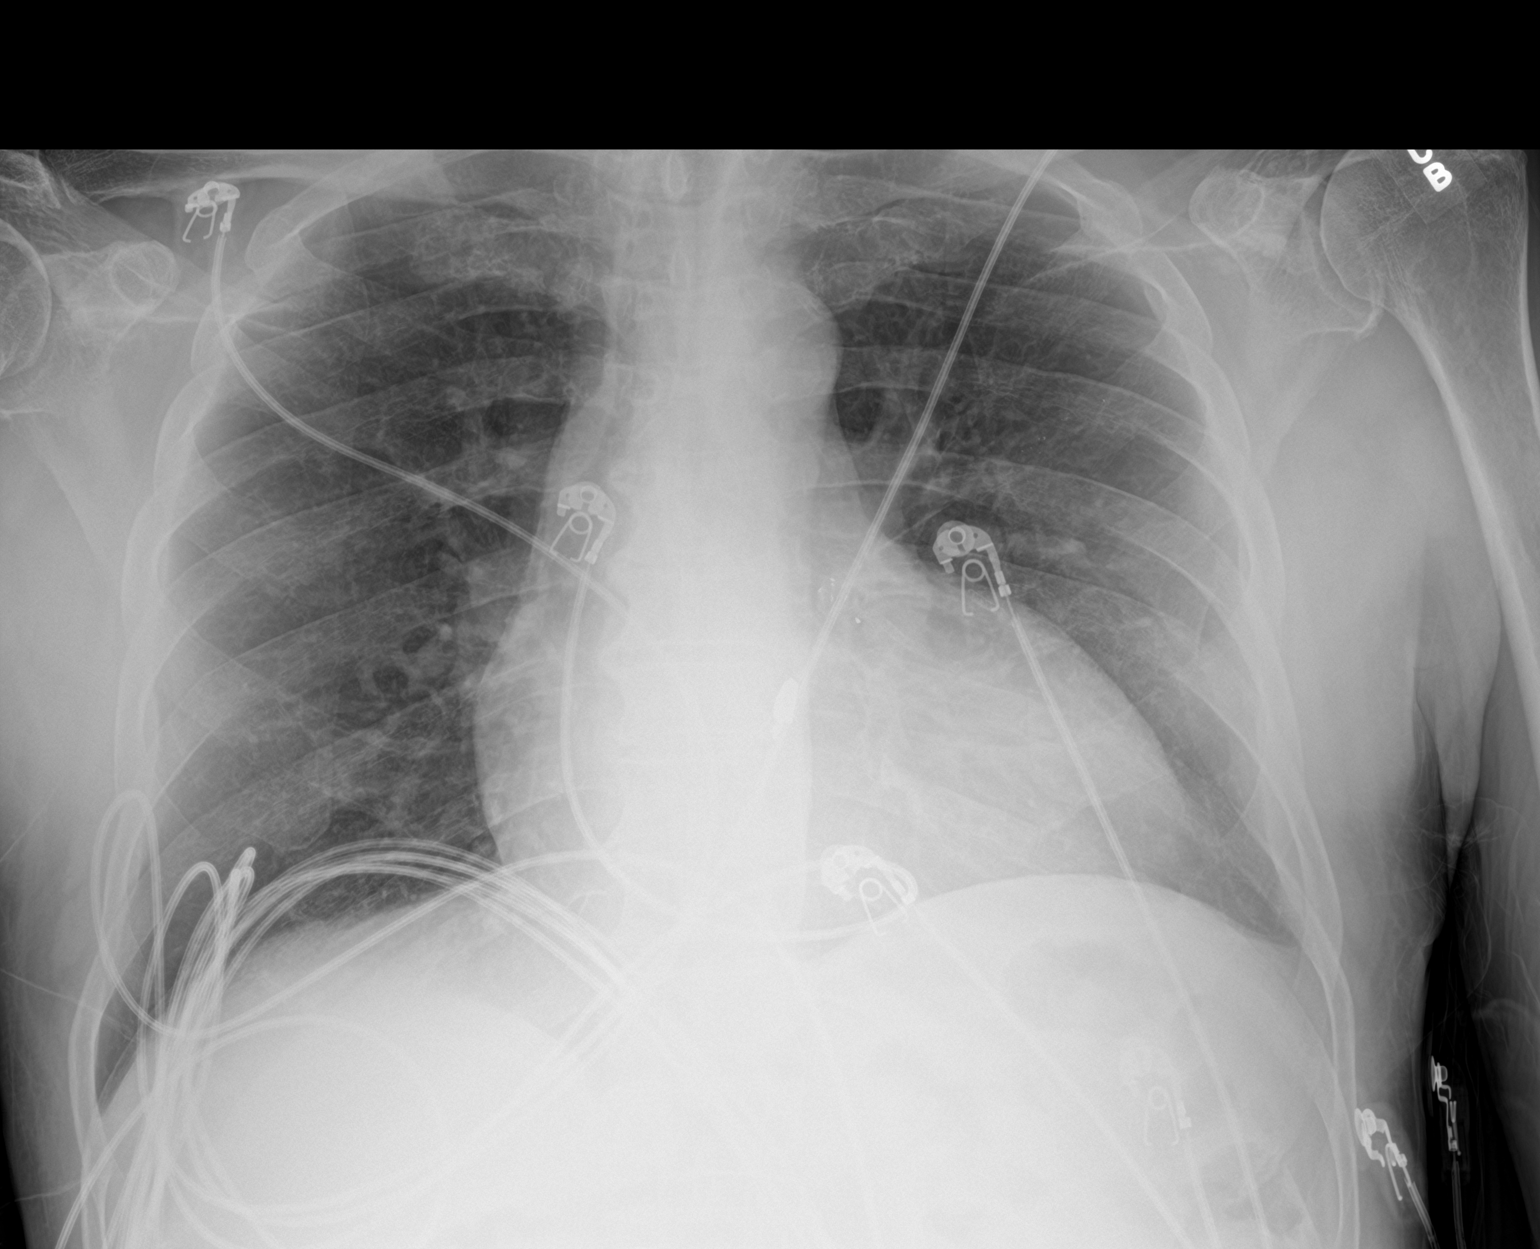

[2 of 2 positions shown; findings below may reference images not displayed]

FINDINGS: Shallow inspiration. Cardiac enlargement. Chronic blunting of left
costophrenic angle suggesting pleural thickening. Infiltration
previously seen in the right lung has mostly resolved. No developing
consolidation. No pneumothorax. Bronchiectasis demonstrated in the
right mid lung. Soft tissue metallic density posterior to the
thoracic spine. Degenerative changes in the spine.
IMPRESSION: Cardiac enlargement. Chronic blunting of left costophrenic angle. No
evidence of active pulmonary disease.

## 2019-03-21 ENCOUNTER — Other Ambulatory Visit: Payer: Self-pay | Admitting: Cardiology
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
## 2019-04-01 ENCOUNTER — Ambulatory Visit: Payer: Medicare HMO | Admitting: Cardiology

## 2019-04-02 ENCOUNTER — Ambulatory Visit (INDEPENDENT_AMBULATORY_CARE_PROVIDER_SITE_OTHER): Payer: BC Managed Care – PPO | Admitting: Cardiology

## 2019-04-02 ENCOUNTER — Other Ambulatory Visit: Payer: Self-pay

## 2019-04-02 ENCOUNTER — Encounter: Payer: Self-pay | Admitting: Cardiology

## 2019-04-02 ENCOUNTER — Telehealth: Payer: Self-pay

## 2019-04-02 VITALS — BP 190/95 | HR 76 | Temp 98.7°F | Ht 74.0 in | Wt 211.0 lb

## 2019-04-02 DIAGNOSIS — I6523 Occlusion and stenosis of bilateral carotid arteries: Secondary | ICD-10-CM | POA: Diagnosis not present

## 2019-04-02 DIAGNOSIS — I2510 Atherosclerotic heart disease of native coronary artery without angina pectoris: Secondary | ICD-10-CM

## 2019-04-02 DIAGNOSIS — I10 Essential (primary) hypertension: Secondary | ICD-10-CM

## 2019-04-02 MED ORDER — CARVEDILOL 12.5 MG PO TABS: 12.5000 mg | ORAL_TABLET | Freq: Two times a day (BID) | ORAL | 2 refills | Status: DC

## 2019-04-02 NOTE — Progress Notes (Signed)
Patient is here for follow up visit.  Subjective:   Carlen Fils [facility], male    DOB: [birth year], 68 y.o.   MRN: [id number]   Chief Complaint  Patient presents with   Coronary Artery Disease   Hypertension   Follow-up    42mo    68y/o [place] male with uncontrolled hypertension, type 2 DM, hyperlipidemia, CKD stage III/V,CAD s/p prior PCI, NSTEMI in 09/2018, attempted but unsuccessful PTCA to chronically occluded Rt PDA.  He has no new complaints today. However, his blood pressure remains uncontrolled. He denies chest pain, shortness of breath, palpitations, leg edema, orthopnea, PND, TIA/syncope. He does not see a nephrologist.   Past Medical History:  Diagnosis Date   Allergy    Anemia    Blood transfusion without reported diagnosis    Cataract    CHF (congestive heart failure) (HCC)    CKD (chronic kidney disease) stage 3, GFR 30-59 ml/min (HCC)    Coronary artery disease    Diabetic peripheral neuropathy (HCC)    GERD (gastroesophageal reflux disease)    Hyperlipidemia    Hypertension    Hypertensive crisis 10/16/2018   Myocardial infarction ([facility]    Noncompliance with medication regimen    Osteoarthritis    "legs, back" (10/16/2018)   Osteoporosis    Pneumonia 203-10-2015  "real bad; I died and they had to bring me back" (10/16/2018)   Seasonal allergies    [place]      Past Surgical History:  Procedure Laterality Date   CARDIAC CATHETERIZATION  10/20/2018   CORONARY BALLOON ANGIOPLASTY N/A 10/20/2018   Procedure: CORONARY BALLOON ANGIOPLASTY;  Surgeon: PNigel Mormon MD;  [facility];  Service: Cardiovascular;  Laterality: N/A;   JOINT REPLACEMENT     LEFT HEART CATH AND CORONARY ANGIOGRAPHY N/A 10/20/2018   Procedure: LEFT HEART CATH AND CORONARY ANGIOGRAPHY;  Surgeon: PNigel Mormon MD;  [facility];  Service: Cardiovascular;  Laterality: N/A;    TOTAL KNEE ARTHROPLASTY Right      Social History   Socioeconomic History   Marital status: Legally Separated    Spouse name: Not on file   Number of children: 4   Years of education: Not on file   Highest education level: Not on file  Occupational History   Not on file  Social Needs   Financial resource strain: Not on file   Food insecurity:    Worry: Not on file    Inability: Not on file   Transportation needs:    Medical: Not on file    Non-medical: Not on file  Tobacco Use   Smoking status: Former Smoker    Packs/day: 0.33    Years: 20.00    Pack years: 6.60    Types: Cigarettes    Last attempt to quit: 1985    Years since quitting: 35.4   Smokeless tobacco: Never Used  Substance and Sexual Activity   Alcohol use: Yes    Comment: 10/16/2018 "couple drinks/year"   Drug use: Never   Sexual activity: Not Currently  Lifestyle   Physical activity:    Days per week: Not on file    Minutes per session: Not on file   Stress: Not on file  Relationships   Social connections:    Talks on phone: Not on file    Gets together: Not on file    Attends religious service: Not on file  Active member of club or organization: Not on file    Attends meetings of clubs or organizations: Not on file    Relationship status: Not on file   Intimate partner violence:    Fear of current or ex partner: Not on file    Emotionally abused: Not on file    Physically abused: Not on file    Forced sexual activity: Not on file  Other Topics Concern   Not on file  Social History Narrative   Not on file     Current Outpatient Medications on File Prior to Visit  Medication Sig Dispense Refill   acetaminophen (TYLENOL) 500 MG tablet Take 2 tablets (1,000 mg total) by mouth 2 (two) times daily as needed for moderate pain. 30 tablet 0   amLODipine (NORVASC) 10 MG tablet Take 1 tablet (10 mg total) by mouth daily. 90 tablet 3   ASPIRIN LOW DOSE 81 MG EC tablet TAKE 1  TABLET BY MOUTH EVERY DAY 90 tablet 3   cholecalciferol (VITAMIN D) 25 MCG (1000 UT) tablet Take 1,000 Units by mouth daily.      Cyanocobalamin 2500 MCG CHEW Chew 2,500 mcg by mouth daily.     feeding supplement, GLUCERNA SHAKE, (GLUCERNA SHAKE) LIQD Take 237 mLs by mouth 3 (three) times daily between meals.  0   furosemide (LASIX) 20 MG tablet TAKE 2 TABLETS BY MOUTH IN THE MORNING AND ADDITIONAL 1 TABLET IN AFTERNOON,IF PERSISTENT LEG EDEMA 90 tablet 0   gabapentin (NEURONTIN) 600 MG tablet TAKE 2 TABLETS BY MOUTH 3 TIMES A DAY 180 tablet 3   linaclotide (LINZESS) 145 MCG CAPS capsule Take 1 capsule (145 mcg total) by mouth daily as needed (constipation). (Patient taking differently: Take 145 mcg by mouth daily as needed (for constipation). ) 30 capsule 0   methocarbamol (ROBAXIN) 500 MG tablet TAKE 1 TABLET BY MOUTH TWICE DAILY AS NEEDED FOR MUSCLE SPASM 120 tablet 1   metoprolol tartrate (LOPRESSOR) 50 MG tablet Take 1 tablet (50 mg total) by mouth 3 (three) times daily. 180 tablet 2   montelukast (SINGULAIR) 10 MG tablet Take 1 tablet (10 mg total) by mouth at bedtime. 90 tablet 3   omeprazole (PRILOSEC) 20 MG capsule Take 20 mg by mouth every evening.      repaglinide (PRANDIN) 0.5 MG tablet Take 1 tablet (0.5 mg total) by mouth 3 (three) times daily before meals. 270 tablet 3   rosuvastatin (CRESTOR) 20 MG tablet TAKE 1 TABLET (20 MG TOTAL) BY MOUTH DAILY AT 6 PM. 90 tablet 1   tadalafil (ADCIRCA/CIALIS) 20 MG tablet Take 1 tablet (20 mg total) by mouth daily as needed for erectile dysfunction. 10 tablet 0   vitamin E (VITAMIN E) 1000 UNIT capsule Take 1,000 Units by mouth daily.      Blood Glucose Monitoring Suppl (ACCU-CHEK GUIDE ME) w/Device KIT 1 Device by Does not apply route 4 (four) times daily -  before meals and at bedtime. 1 kit 0   Continuous Blood Gluc Receiver (FREESTYLE LIBRE READER) DEVI 1 Device by Does not apply route as directed. 1 Device 0   Continuous  Blood Gluc Sensor (FREESTYLE LIBRE 14 DAY SENSOR) MISC 1 each by Does not apply route daily. 2 each 11   No current facility-administered medications on file prior to visit.     Cardiovascular studies:  Coronary angiogram 10/20/2018: LM: Normal LAD: Distal 70% diffuse disease.        Ostial 90% stenosis in Diag1. Patent prox  Diag 1 stent LCx: Large OM1 with patent prior stent RCA: 100% occluded RPDA        Partially successful PTCA attempt        100%--99% stenosis        TIMI flow 0-I  LVEDP 16 mmHg  Echocardiogram 10/17/2018:  Left ventricle: The cavity size was normal. Wall thickness was   increased in a pattern of severe LVH. Systolic function was   normal. The estimated ejection fraction was in the range of 50%   to 55%. Wall motion was normal; there were no regional wall   motion abnormalities. Doppler parameters are consistent with   abnormal left ventricular relaxation (grade 1 diastolic   dysfunction). Doppler parameters are consistent with high   ventricular filling pressure. - Left atrium: The atrium was moderately dilated. - Right atrium: The atrium was mildly dilated.  Impressions:  - Low normal to mildly reduced LV systolic function; EF 50; severe   LVH; mild diastolic dysfunction; biatrial enlargement.  Labs: Results for TITO, AUSMUS (MRN [id number]) as of 04/02/2019 14:59  Ref. Range 12/10/2018 11:57 12/11/2018 04:25  COMPREHENSIVE METABOLIC PANEL Unknown Rpt (A) Rpt (A)  Sodium Latest Ref Range: 135 - 145 mmol/L 139 139  Potassium Latest Ref Range: 3.5 - 5.1 mmol/L 5.1 4.4  Chloride Latest Ref Range: 98 - 111 mmol/L 105 107  CO2 Latest Ref Range: 22 - 32 mmol/L 21 (L) 22  Glucose Latest Ref Range: 70 - 99 mg/dL 177 (H) 186 (H)  Mean Plasma Glucose Latest Units: mg/dL  163  BUN Latest Ref Range: 8 - 23 mg/dL 33 (H) 32 (H)  Creatinine Latest Ref Range: 0.61 - 1.24 mg/dL 2.33 (H) 2.21 (H)  Calcium Latest Ref Range: 8.9 - 10.3 mg/dL 8.0 (L) 7.8  (L)  Anion gap Latest Ref Range: 5 - _0 Alkaline Phosphatase Latest Ref Range: 38 - 126 U/L 76 75  Albumin Latest Ref Range: 3.5 - 5.0 g/dL 2.0 (L) 1.9 (L)  Lipase Latest Ref Range: 11 - 51 U/L 22   AST Latest Ref Range: 15 - 41 U/L 16 15  ALT Latest Ref Range: 0 - 44 U/L 10 10  Total Protein Latest Ref Range: 6.5 - 8.1 g/dL 5.9 (L) 6.2 (L)  Total Bilirubin Latest Ref Range: 0.3 - 1.2 mg/dL 0.8 0.8  GFR, Est Non African American Latest Ref Range: >60 mL/min 27 (L) 29 (L)  GFR, Est African American Latest Ref Range: >60 mL/min 32 (L) 34 (L)  Troponin I Latest Ref Range: <0.03 ng/mL 0.04 [facility])    Results for ESSAM, LOWDERMILK (MRN [id number]) as of 04/02/2019 14:59  Ref. Range 12/11/2018 04:25 12/11/2018 16:50 12/12/2018 07:30 12/30/2018 15:30  HCT Latest Ref Range: 39.0 - 52.0 % 26.9 (L) 26.9 (L) 27.3 (L) 29.2 (L)   Results for VAL, SCHIAVO (MRN [id number]) as of 04/02/2019 14:59  Ref. Range 03/05/2019 14:03  Hemoglobin A1C Latest Ref Range: 4.0 - 5.6 % 7.8 (A)   Review of Systems  Constitution: Negative for decreased appetite, malaise/fatigue, weight gain and weight loss.  HENT: Negative for congestion.   Eyes: Negative for visual disturbance.  Cardiovascular: Negative for chest pain, dyspnea on exertion, leg swelling, palpitations and syncope.  Respiratory: Negative for shortness of breath.   Endocrine: Negative for cold intolerance.  Hematologic/Lymphatic: Does not bruise/bleed easily.  Skin: Negative for itching and rash.  Musculoskeletal: Negative for myalgias.  Gastrointestinal: Negative for abdominal pain, nausea and vomiting.  Genitourinary:  Negative for dysuria.       Erectile dysfunction  Neurological: Negative for dizziness and weakness.  Psychiatric/Behavioral: The patient is not nervous/anxious.   All other systems reviewed and are negative.      Objective:    Vitals:   04/02/19 1449  BP: (!) 190/95  Pulse: 76  Temp: 98.7 F (37.1 C)  SpO2: 97%       Physical Exam  Constitutional: He is oriented to person, place, and time. He appears well-developed and well-nourished. No distress.  HENT:  Head: Normocephalic and atraumatic.  Eyes: Pupils are equal, round, and reactive to light. Conjunctivae are normal.  Neck: No JVD present.  Cardiovascular: Normal rate and regular rhythm.  No murmur heard. Pulses:      Dorsalis pedis pulses are 0 on the left side.       Posterior tibial pulses are 1+ on the right side and 1+ on the left side.  Pulmonary/Chest: Effort normal and breath sounds normal. He has no wheezes. He has no rales.  Abdominal: Soft. Bowel sounds are normal. There is no rebound.  Musculoskeletal:        General: No edema.  Lymphadenopathy:    He has no cervical adenopathy.  Neurological: He is alert and oriented to person, place, and time. No cranial nerve deficit.  Skin: Skin is warm and dry.  Psychiatric: He has a normal mood and affect.  Nursing note and vitals reviewed.       Assessment & Recommendations:   68 y/o [place] American male with uncontrolled hypertension, type 2 DM, hyperlipidemia, CKD stage III/V.  CAD: CAD s/p prior PCI, NSTEMI in 09/2018, attempted but unsuccessful. PTCA to chronically occluded Rt PDA Stable without angina symptoms. Given his advanced CKD, I do not recommend further revascualrization attempts. Brilinta stopped due to concern for upper GI bleed. Continue Aspirin, if possible.  Given his wishes to use PDE5 inhibitor for ED, I have stopped hi Imdur and hydralazine.   Hypertension: Switch metoprolol 75 bid to coreg 12.5 mg bid.  Mild bilateral carotid stenosis: Continue medical management  Advanced CKD: Recommend seeing nephrology.    Nigel Mormon, MD [facility] [place] Pager: [phone] Office: [phone] If no answer Cell [phone]

## 2019-04-02 NOTE — Telephone Encounter (Signed)
[facility] faxed a notification of pt request to discontinue the services with [facility], pt form has been signed faxed to [facility]

## 2019-04-03 NOTE — Progress Notes (Addendum)
[facility] Note  04/06/2019     CHIEF COMPLAINT Patient presents for Retina Evaluation   HISTORY OF PRESENT ILLNESS: William Webb is a 68 y.o. male who presents to the clinic today for:   HPI    Retina Evaluation    In both eyes.  This started years ago.  Duration of years.  Associated Symptoms Flashes and Floaters.  Context:  distance vision and near vision.  I, the attending physician,  performed the HPI with the patient and updated documentation appropriately.          Comments    Last HgA1c: 7.8 Last BS: 168 last night Patient states his vision is terrible while driving--misses exits and street signs.  Patient complains of increased glare while driving at night and does not drive when it's raining due to glare.  Patient states fine print is getting more difficult to see as well.  Patient denies eye pain or discomfort.  Patient complains of intermittent floaters and flashes of light in both eyes.          Last edited by [name], MD on 04/06/2019  1:01 PM. (History)    pt states he was sent here by his PCP, Dr. [name], for DM exam, pt states he just moved back here from [facility], pt states he's never had any eye sx, but he has had metal removed from his eye a couple times, pt states his endocrinologist took him off of insulin, he states his blood pressure was 128/88 last time he checked it  Referring physician: Billie Ruddy, MD [place], [place] [id number]  HISTORICAL INFORMATION:   Selected notes from the [facility] Referred by Dr. [name] for DM exam LEE:  Ocular Hx-cataracts OU PMH-DM (A1c: 7.8 [05.07.20], takes repaglinide, tresiba), CHF,CKD, CAD, HLD, HTN, MI   CURRENT MEDICATIONS: No current outpatient medications on file. (Ophthalmic Drugs)   No current facility-administered medications for this visit.  (Ophthalmic Drugs)   Current Outpatient Medications (Other)   Medication Sig  . acetaminophen (TYLENOL) 500 MG tablet Take 2 tablets (1,000 mg total) by mouth 2 (two) times daily as needed for moderate pain.  Marland Kitchen amLODipine (NORVASC) 10 MG tablet Take 1 tablet (10 mg total) by mouth daily.  . ASPIRIN LOW DOSE 81 MG EC tablet TAKE 1 TABLET BY MOUTH EVERY DAY  . Blood Glucose Monitoring Suppl (ACCU-CHEK GUIDE ME) w/Device KIT 1 Device by Does not apply route 4 (four) times daily -  before meals and at bedtime.  . carvedilol (COREG) 12.5 MG tablet Take 1 tablet (12.5 mg total) by mouth 2 (two) times daily.  . cholecalciferol (VITAMIN D) 25 MCG (1000 UT) tablet Take 1,000 Units by mouth daily.   . Continuous Blood Gluc Receiver (FREESTYLE LIBRE READER) DEVI 1 Device by Does not apply route as directed.  . Continuous Blood Gluc Sensor (FREESTYLE LIBRE 14 DAY SENSOR) MISC 1 each by Does not apply route daily.  . Cyanocobalamin 2500 MCG CHEW Chew 2,500 mcg by mouth daily.  . feeding supplement, GLUCERNA SHAKE, (GLUCERNA SHAKE) LIQD Take 237 mLs by mouth 3 (three) times daily between meals.  . furosemide (LASIX) 20 MG tablet TAKE 2 TABLETS BY MOUTH IN THE MORNING AND ADDITIONAL 1 TABLET IN AFTERNOON,IF PERSISTENT LEG EDEMA  . gabapentin (NEURONTIN) 600 MG tablet TAKE 2 TABLETS BY MOUTH 3 TIMES A DAY  . linaclotide (LINZESS) 145 MCG CAPS capsule Take 1 capsule (145  mcg total) by mouth daily as needed (constipation). (Patient taking differently: Take 145 mcg by mouth daily as needed (for constipation). )  . methocarbamol (ROBAXIN) 500 MG tablet TAKE 1 TABLET BY MOUTH TWICE DAILY AS NEEDED FOR MUSCLE SPASM  . montelukast (SINGULAIR) 10 MG tablet Take 1 tablet (10 mg total) by mouth at bedtime.  Marland Kitchen omeprazole (PRILOSEC) 20 MG capsule Take 20 mg by mouth every evening.   . repaglinide (PRANDIN) 0.5 MG tablet Take 1 tablet (0.5 mg total) by mouth 3 (three) times daily before meals.  . rosuvastatin (CRESTOR) 20 MG tablet TAKE 1 TABLET (20 MG TOTAL) BY MOUTH DAILY AT 6 PM.   . tadalafil (ADCIRCA/CIALIS) 20 MG tablet Take 1 tablet (20 mg total) by mouth daily as needed for erectile dysfunction.  . vitamin E (VITAMIN E) 1000 UNIT capsule Take 1,000 Units by mouth daily.    No current facility-administered medications for this visit.  (Other)      REVIEW OF SYSTEMS: ROS    Positive for: Endocrine, Cardiovascular, Eyes   Negative for: Constitutional, Gastrointestinal, Neurological, Skin, Genitourinary, Musculoskeletal, HENT, Respiratory, Psychiatric, Allergic/Imm, Heme/Lymph   Last edited by Doneen Poisson on 04/06/2019 12:30 PM. (History)       ALLERGIES No Known Allergies  PAST MEDICAL HISTORY Past Medical History:  Diagnosis Date  . Allergy   . Anemia   . Blood transfusion without reported diagnosis   . Cataract   . CHF (congestive heart failure) ([place])   . CKD (chronic kidney disease) stage 3, GFR 30-59 ml/min (HCC)   . Coronary artery disease   . Diabetic peripheral neuropathy ([place])   . GERD (gastroesophageal reflux disease)   . Hyperlipidemia   . Hypertension   . Hypertensive crisis 10/16/2018  . Myocardial infarction ([place])   . Noncompliance with medication regimen   . Osteoarthritis    "legs, back" (10/16/2018)  . Osteoporosis   . Pneumonia January 20, 2016   "real bad; I died and they had to bring me back" (10/16/2018)  . Seasonal allergies   . Type II diabetes mellitus ([place])    Past Surgical History:  Procedure Laterality Date  . CARDIAC CATHETERIZATION  10/20/2018  . CORONARY BALLOON ANGIOPLASTY N/A 10/20/2018   Procedure: CORONARY BALLOON ANGIOPLASTY;  Surgeon: Nigel Mormon, MD;  Location: [facility];  Service: Cardiovascular;  Laterality: N/A;  . JOINT REPLACEMENT    . LEFT HEART CATH AND CORONARY ANGIOGRAPHY N/A 10/20/2018   Procedure: LEFT HEART CATH AND CORONARY ANGIOGRAPHY;  Surgeon: Nigel Mormon, MD;  Location: [facility];  Service: Cardiovascular;  Laterality: N/A;  . TOTAL KNEE ARTHROPLASTY Right      FAMILY HISTORY Family History  Problem Relation Age of Onset  . Hypertension Mother   . Diabetes Mother   . Hyperlipidemia Mother   . Hypertension Father   . Hypertension Sister   . Cancer Sister   . Colon cancer Brother   . Esophageal cancer Neg Hx   . Stomach cancer Neg Hx   . Rectal cancer Neg Hx     SOCIAL HISTORY Social History   Tobacco Use  . Smoking status: Former Smoker    Packs/day: 0.33    Years: 20.00    Pack years: 6.60    Types: Cigarettes    Last attempt to quit: 1985    Years since quitting: 35.4  . Smokeless tobacco: Never Used  Substance Use Topics  . Alcohol use: Yes    Comment: 10/16/2018 "couple drinks/year"  .  Drug use: Never         OPHTHALMIC EXAM:  Base Eye Exam    Visual Acuity (Snellen - Linear)      Right Left   Dist [place] 20/50 +1 20/40 -1   Dist ph [place] 20/30 +1 20/25 -1       Tonometry (Tonopen, 12:42 PM)      Right Left   Pressure 17 20       Pupils      Dark Light Shape React APD   Right 3 2 Round Brisk 0   Left 3 2 Round Brisk 0       Visual Fields      Left Right    Full Full       Extraocular Movement      Right Left    Full Full       Neuro/Psych    Oriented x3:  Yes   Mood/Affect:  Normal       Dilation    Both eyes:  1.0% Mydriacyl, 2.5% Phenylephrine @ 12:42 PM        Slit Lamp and Fundus Exam    Slit Lamp Exam      Right Left   Lids/Lashes Dermatochalasis - upper lid, Meibomian gland dysfunction Dermatochalasis - upper lid, Meibomian gland dysfunction   Conjunctiva/Sclera Mild Melanosis, mild Conjunctivochalasis, 0.7 mm lesion temporal conj Mild Melanosis, inferior Conjunctivochalasis   Cornea 1+ Punctate epithelial erosions, 1+ pigment guttata 2-3+pigmented gutta1+ Punctate epithelial erosions, 1+ endo pigment   Anterior Chamber moderate depth, narrow angles moderate depth, narrow angles   Iris Round and dilated, No NVI Round and dilated, No NVI   Lens 2+ Nuclear sclerosis, 3+ Cortical  cataract 2-3+ Nuclear sclerosis, 3+ Cortical cataract   Vitreous Mild Vitreous syneresis Mild Vitreous syneresis       Fundus Exam      Right Left   Disc Pink and Sharp, +cupping, temporal PPA Sharp rim, mild pallor, +cupping, mild temporal PPA   C/D Ratio 0.7 0.6   Macula Flat, Blunted foveal reflex, mild MA/exudate temporal macula Flat, Blunted foveal reflex, mild RPE mottling, mild MA/exudate temporal macula   Vessels Vascular attenuation, mild Tortuousity Vascular attenuation, mild Tortuousity   Periphery Attached, scattered IRH/exudate, scattered RPE changes   Attached, scattered IRH/exudate, scattered RPE changes          Refraction    Wearing Rx      Sphere   Right OTC readers   Left OTC readers       Manifest Refraction      Sphere Cylinder Dist VA   Right +1.25 Sphere 20/30-2   Left +1.25 Sphere 20/25-1          IMAGING AND PROCEDURES  Imaging and Procedures for '@TODAY'$ @  OCT, Retina - OU - Both Eyes       Right Eye Quality was good. Central Foveal Thickness: 218. Progression has no prior data. Findings include normal foveal contour, no SRF, intraretinal fluid, intraretinal hyper-reflective material (Cluster of IRF/IRHM temporal macula).   Left Eye Quality was good. Central Foveal Thickness: 248. Progression has no prior data. Findings include abnormal foveal contour, vitreous traction, intraretinal fluid, intraretinal hyper-reflective material, no SRF (Clusters of IRF and IRHM temporal macula, non-central, mild VMT).   Notes *Images captured and stored on drive  Diagnosis / Impression:  Non-central DME OU Clusters of IRF/IRHM temporal macula OU   Clinical management:  See below  Abbreviations: NFP - Normal foveal profile. CME - cystoid  macular edema. PED - pigment epithelial detachment. IRF - intraretinal fluid. SRF - subretinal fluid. EZ - ellipsoid zone. ERM - epiretinal membrane. ORA - outer retinal atrophy. ORT - outer retinal tubulation. SRHM -  subretinal hyper-reflective material        Fluorescein Angiography Optos (Transit OD)       Right Eye   Progression has no prior data. Early phase findings include vascular perfusion defect, microaneurysm. Mid/Late phase findings include microaneurysm, vascular perfusion defect, leakage (+IRMA).   Left Eye   Progression has no prior data. Early phase findings include microaneurysm, vascular perfusion defect, retinal neovascularization. Mid/Late phase findings include microaneurysm, leakage, retinal neovascularization, vascular perfusion defect.   Notes **Images stored on drive**  Impression: OD: Severe NPDR with capillary nonperfusion; late leaking MA and IRMA; no NV OS: PDR with capillary nonperfusion; late leaking MA; +NVE inf nasal quads                 ASSESSMENT/PLAN:    ICD-10-CM   1. 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 Retinal edema H35.81 OCT, Retina - OU - Both Eyes  4. Vitreomacular traction, left H43.822   5. Essential hypertension I10   6. Hypertensive retinopathy of both eyes H35.033 Fluorescein Angiography Optos (Transit OD)  7. Combined forms of age-related cataract of both eyes H25.813   8. Glaucoma suspect of both eyes H40.003     1-3. PDR w/ macular edema OS        Severe nonproliferative diabetic retinopathy w/ DME OD - The incidence, risk factors for progression, natural history and treatment options for diabetic retinopathy  were discussed with patient.   - The need for close monitoring of blood glucose, blood pressure, and serum lipids, avoiding cigarette or any type of tobacco, and the need for long term follow up was also discussed  with patient. - exam shows scattered IRH/DBH OU - FA shows capillary nonperfusion OU; late leaking MA OU; +NVE inf nasal quads OS only (OD no NV) - OCT shows noncentral diabetic macular edema, both eyes The natural history, pathology, and characteristics of diabetic macular edema discussed with patient.  A generalized discussion of the major clinical trials concerning treatment of diabetic macular edema (ETDRS, DCT, SCORE, RISE / RIDE, and ongoing DRCR net studies) was completed.  This discussion included mention of the various approaches to treating diabetic macular edema (observation, laser photocoagulation, anti-VEGF injections with lucentis / Avastin / Eylea, steroid injections with Kenalog / Ozurdex, and intraocular surgery with vitrectomy).  The goal hemoglobin A1C of 6-7 was discussed, as well as importance of smoking cessation and hypertension control.  Need for ongoing treatment and monitoring were specifically discussed with reference to chronic nature of diabetic macular edema. - recommend holding off on DME treatment for now - recommend PRP OS for PDR OS - pt wishes to proceed -- scheduled for 6.30.20 - f/u in 3 wks for DFE/OCT/PRP OS  4. VMT OS - mild traction centrally with minimal distortion of fovea - no IRF/SRF  5,6. Hypertensive retinopathy OU  - discussed importance of tight BP control  - monitor  7. Mixed form age related cataract OU  - The symptoms of cataract, surgical options, and treatments and risks were discussed with patient.  -  discussed diagnosis and progression  - pt reports significant glare symptoms  - will refer to [facility] for cat eval  8. Glaucoma Suspect  - borderline IOPs OU (17, 20)  - significant cupping and pallor of disc OU  - will refer to [facility] Aver for glaucoma eval / management    Ophthalmic Meds Ordered this visit:  No orders of the defined types were placed in this encounter.      Return for f/u 6-8 weeks NPDR OU, DFE,  OCT.  There are no Patient Instructions on file for this visit.   Explained the diagnoses, plan, and follow up with the patient and they expressed understanding.  Patient expressed understanding of the importance of proper follow up care.   This document serves as a record of services personally performed by Gardiner Sleeper, MD, PhD. It was created on their behalf by Ernest Mallick, OA, an ophthalmic assistant. The creation of this record is the provider's dictation and/or activities during the visit.    Electronically signed by: Ernest Mallick, OA  06.05.2020 4:25 PM    Gardiner Sleeper, M.D., Ph.D. Diseases & Surgery of the Retina and Vitreous [facility]  I have reviewed the above documentation for accuracy and completeness, and I agree with the above. Gardiner Sleeper, M.D., Ph.D. 04/06/19 4:25 PM    Abbreviations: M myopia (nearsighted); A astigmatism; H hyperopia (farsighted); P presbyopia; Mrx spectacle prescription;  CTL contact lenses; OD right eye; OS left eye; OU both eyes  XT exotropia; ET esotropia; PEK punctate epithelial keratitis; PEE punctate epithelial erosions; DES dry eye syndrome; MGD meibomian gland dysfunction; ATs artificial tears; PFAT's preservative free artificial tears; McCoy nuclear sclerotic cataract; PSC posterior subcapsular cataract; ERM epi-retinal membrane; PVD posterior vitreous detachment; RD retinal detachment; DM diabetes mellitus; DR diabetic retinopathy; NPDR non-proliferative diabetic retinopathy; PDR proliferative diabetic retinopathy; CSME clinically significant macular edema; DME diabetic macular edema; dbh dot blot hemorrhages; CWS cotton wool spot; POAG primary open angle glaucoma; C/D cup-to-disc ratio; HVF humphrey visual field; GVF goldmann visual field; OCT optical coherence tomography; IOP intraocular pressure; BRVO Branch retinal vein occlusion; CRVO central retinal vein occlusion; CRAO central retinal artery occlusion; BRAO  branch retinal artery occlusion; RT retinal tear; SB scleral buckle; PPV pars plana vitrectomy; VH Vitreous hemorrhage; PRP panretinal laser photocoagulation; IVK intravitreal kenalog; VMT vitreomacular traction; MH Macular hole;  NVD neovascularization of the disc; NVE neovascularization elsewhere; AREDS age related eye disease study; ARMD age related macular degeneration; POAG primary open angle glaucoma; EBMD epithelial/anterior basement membrane dystrophy; ACIOL anterior chamber intraocular lens; IOL intraocular lens; PCIOL posterior chamber intraocular lens; Phaco/IOL phacoemulsification with intraocular lens placement; [place] photorefractive keratectomy; LASIK laser assisted in situ keratomileusis; HTN hypertension; DM diabetes mellitus; COPD chronic obstructive pulmonary disease

## 2019-04-05 ENCOUNTER — Encounter: Payer: Self-pay | Admitting: Cardiology

## 2019-04-05 DIAGNOSIS — I2510 Atherosclerotic heart disease of native coronary artery without angina pectoris: Secondary | ICD-10-CM | POA: Insufficient documentation

## 2019-04-06 ENCOUNTER — Encounter (INDEPENDENT_AMBULATORY_CARE_PROVIDER_SITE_OTHER): Payer: Self-pay | Admitting: Ophthalmology

## 2019-04-06 ENCOUNTER — Ambulatory Visit (INDEPENDENT_AMBULATORY_CARE_PROVIDER_SITE_OTHER): Payer: BC Managed Care – PPO | Admitting: Ophthalmology

## 2019-04-06 ENCOUNTER — Other Ambulatory Visit: Payer: Self-pay

## 2019-04-06 DIAGNOSIS — I10 Essential (primary) hypertension: Secondary | ICD-10-CM

## 2019-04-06 DIAGNOSIS — H40003 Preglaucoma, unspecified, bilateral: Secondary | ICD-10-CM

## 2019-04-06 DIAGNOSIS — H35033 Hypertensive retinopathy, bilateral: Secondary | ICD-10-CM | POA: Diagnosis not present

## 2019-04-06 DIAGNOSIS — H43822 Vitreomacular adhesion, left eye: Secondary | ICD-10-CM

## 2019-04-06 DIAGNOSIS — H25813 Combined forms of age-related cataract, bilateral: Secondary | ICD-10-CM

## 2019-04-06 DIAGNOSIS — E113411 Type 2 diabetes mellitus with severe nonproliferative diabetic retinopathy with macular edema, right eye: Secondary | ICD-10-CM | POA: Diagnosis not present

## 2019-04-06 DIAGNOSIS — E113512 Type 2 diabetes mellitus with proliferative diabetic retinopathy with macular edema, left eye: Secondary | ICD-10-CM

## 2019-04-06 DIAGNOSIS — H3581 Retinal edema: Secondary | ICD-10-CM

## 2019-04-08 ENCOUNTER — Ambulatory Visit: Payer: Medicare HMO | Admitting: Family Medicine

## 2019-04-15 ENCOUNTER — Ambulatory Visit: Payer: Medicare HMO | Admitting: Family Medicine

## 2019-04-18 ENCOUNTER — Other Ambulatory Visit: Payer: Self-pay | Admitting: Cardiology

## 2019-04-20 ENCOUNTER — Telehealth: Payer: Self-pay | Admitting: Endocrinology

## 2019-04-20 ENCOUNTER — Telehealth: Payer: Self-pay | Admitting: Hematology

## 2019-04-20 ENCOUNTER — Other Ambulatory Visit: Payer: Self-pay

## 2019-04-20 DIAGNOSIS — E1122 Type 2 diabetes mellitus with diabetic chronic kidney disease: Secondary | ICD-10-CM

## 2019-04-20 DIAGNOSIS — N183 Chronic kidney disease, stage 3 unspecified: Secondary | ICD-10-CM

## 2019-04-20 MED ORDER — FREESTYLE LIBRE 14 DAY SENSOR MISC: 1.0000 | Freq: Every day | 11 refills | Status: DC

## 2019-04-20 NOTE — Telephone Encounter (Signed)
MEDICATION: Free Style Libre  PHARMACY:  [facility]  IS THIS A 90 DAY SUPPLY : unk  IS PATIENT OUT OF MEDICATION: restart RX  IF NOT; HOW MUCH IS LEFT: none  LAST APPOINTMENT DATE: @5 /04/2019  NEXT APPOINTMENT DATE:@7 /05/2019  DO WE HAVE YOUR PERMISSION TO LEAVE A DETAILED MESSAGE: per patient ouldl ike FreeStyle Libre RX restarted since his insurance is active again  OTHER COMMENTS:    **Let patient know to contact pharmacy at the end of the day to make sure medication is ready. **  ** Please notify patient to allow 48-72 hours to process**  **Encourage patient to contact the pharmacy for refills or they can request refills through [facility]**

## 2019-04-20 NOTE — Telephone Encounter (Signed)
Continuous Blood Gluc Sensor (FREESTYLE LIBRE 14 DAY SENSOR) MISC 2 each 11 04/20/2019    Sig - Route: 1 each by Does not apply route daily. - Does not apply   Sent to pharmacy as: Continuous Blood Gluc Sensor (FREESTYLE LIBRE Schlusser) Misc   E-Prescribing Status: Receipt confirmed by pharmacy (04/20/2019 11:31 AM EDT)

## 2019-04-20 NOTE — Telephone Encounter (Signed)
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 I spoke to the pt's daughter and scheduled him to see Dr. Irene Limbo on 6/25 at [facility] to arrive 20 minutes early.

## 2019-04-21 ENCOUNTER — Other Ambulatory Visit: Payer: Self-pay

## 2019-04-21 DIAGNOSIS — N183 Chronic kidney disease, stage 3 unspecified: Secondary | ICD-10-CM

## 2019-04-21 DIAGNOSIS — E1122 Type 2 diabetes mellitus with diabetic chronic kidney disease: Secondary | ICD-10-CM

## 2019-04-21 MED ORDER — DEXCOM G6 SENSOR MISC: 1.0000 | 2 refills | Status: DC

## 2019-04-21 MED ORDER — DEXCOM G6 TRANSMITTER MISC: 1.0000 | 0 refills | Status: DC

## 2019-04-21 NOTE — Telephone Encounter (Signed)
Please fill

## 2019-04-22 ENCOUNTER — Telehealth: Payer: Self-pay

## 2019-04-22 NOTE — Progress Notes (Signed)
HEMATOLOGY/ONCOLOGY CONSULTATION NOTE  Date of Service: 04/23/2019  Patient Care Team: Billie Ruddy, MD as PCP - General (Family Medicine)  Dr. Hart Robinsons in GI Dr. Christinia Gully in Pulmonology Dr. Benito Mccreedy [facility] in Endocrinology Dr. Vernell Leep in Cardiology Dr. Harrie Jeans in Nephrology  CHIEF COMPLAINTS/PURPOSE OF CONSULTATION:  Monoclonal Paraproteinemia  HISTORY OF PRESENTING ILLNESS:   William Webb is a wonderful 68 y.o. male who has been referred to [place] by Dr. 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 The pt reports that he is doing well overall.   The pt reports that he first found out he had CKD when he had pneumonia in December 2019. The pt notes that his last several years working in a [employer] have been very hard, and during that time he was not able to prioritize his health. He notes that he has been having difficulty adjusting to the recommended diet per nephrology. He had a NSTEMI and cardiac cath in December 2019 as well. He endorses a history of DM type II, is insulin dependent and notes that he takes 6 pills per day of Neurontin for his diabetic neuropathy.   The pt denies any fevers or chills. He notes that he has had some night sweats for several years.  Most recent lab results (04/09/19) of CBC w/diff and CMP is as follows: all values are WNL except for RBC at 3.88, HGB at 11.2, HCT at 33.7%, Glucose at 221, BUN at 32, Creatinine at 2.87, GFR at 25, Calcium at 8.3, Albumin at 3.2. 04/09/19 Vitamin D at 15.8 04/09/19 Ferritin at 333 04/09/19 SPEP revealed all values WNL except for Total Protein at 5.6g, Albumin at 2.8g, and M Protein at 0.3g. 04/09/19 SFLC revealed Kappa at 466.6, Lambda at 44.8, K:L ratio at 10.42  On review of systems, pt reports good energy levels, occasional leg swelling, and denies fevers, chills, abdominal pains, and any other symptoms.   On PMHx the pt  reports CKD stage III, HLD, DM type II, NSTEMI in December 2019, neuropathy. On Social Hx the pt reports working in [employer] for the last 36 years. He denies consuming any alcohol. On Family Hx the pt reports HTN, DM type II, CKD.  MEDICAL HISTORY:  Past Medical History:  Diagnosis Date  . Allergy   . Anemia   . Blood transfusion without reported diagnosis   . Cataract   . CHF (congestive heart failure) ([place])   . CKD (chronic kidney disease) stage 3, GFR 30-59 ml/min (HCC)   . Coronary artery disease   . Diabetic peripheral neuropathy ([place])   . GERD (gastroesophageal reflux disease)   . Hyperlipidemia   . Hypertension   . Hypertensive crisis 10/16/2018  . Myocardial infarction ([place])   . Noncompliance with medication regimen   . Osteoarthritis    "legs, back" (10/16/2018)  . Osteoporosis   . Pneumonia 12-Jan-2016   "real bad; I died and they had to bring me back" (10/16/2018)  . Seasonal allergies   . Type II diabetes mellitus ([place])     SURGICAL HISTORY: Past Surgical History:  Procedure Laterality Date  . CARDIAC CATHETERIZATION  10/20/2018  . CORONARY BALLOON ANGIOPLASTY N/A 10/20/2018   Procedure: CORONARY BALLOON ANGIOPLASTY;  Surgeon: Nigel Mormon, MD;  Location: [facility];  Service: Cardiovascular;  Laterality: N/A;  . JOINT REPLACEMENT    . LEFT HEART CATH AND CORONARY ANGIOGRAPHY N/A 10/20/2018   Procedure: LEFT  HEART CATH AND CORONARY ANGIOGRAPHY;  Surgeon: Nigel Mormon, MD;  Location: [facility];  Service: Cardiovascular;  Laterality: N/A;  . TOTAL KNEE ARTHROPLASTY Right     SOCIAL HISTORY: Social History   Socioeconomic History  . Marital status: Legally Separated    Spouse name: Not on file  . Number of children: 4  . Years of education: Not on file  . Highest education level: Not on file  Occupational History  . Not on file  Social Needs  . Financial resource strain: Not on file  . Food insecurity    Worry: Not on file     Inability: Not on file  . Transportation needs    Medical: Not on file    Non-medical: Not on file  Tobacco Use  . Smoking status: Former Smoker    Packs/day: 0.33    Years: 20.00    Pack years: 6.60    Types: Cigarettes    Quit date: 1985    Years since quitting: 35.5  . Smokeless tobacco: Never Used  Substance and Sexual Activity  . Alcohol use: Yes    Comment: 10/16/2018 "couple drinks/year"  . Drug use: Never  . Sexual activity: Not Currently  Lifestyle  . Physical activity    Days per week: Not on file    Minutes per session: Not on file  . Stress: Not on file  Relationships  . Social Herbalist on phone: Not on file    Gets together: Not on file    Attends religious service: Not on file    Active member of club or organization: Not on file    Attends meetings of clubs or organizations: Not on file    Relationship status: Not on file  . Intimate partner violence    Fear of current or ex partner: Not on file    Emotionally abused: Not on file    Physically abused: Not on file    Forced sexual activity: Not on file  Other Topics Concern  . Not on file  Social History Narrative  . Not on file    FAMILY HISTORY: Family History  Problem Relation Age of Onset  . Hypertension Mother   . Diabetes Mother   . Hyperlipidemia Mother   . Hypertension Father   . Hypertension Sister   . Cancer Sister   . Colon cancer Brother   . Esophageal cancer Neg Hx   . Stomach cancer Neg Hx   . Rectal cancer Neg Hx     ALLERGIES:  has No Known Allergies.  MEDICATIONS:  Current Outpatient Medications  Medication Sig Dispense Refill  . acetaminophen (TYLENOL) 500 MG tablet Take 2 tablets (1,000 mg total) by mouth 2 (two) times daily as needed for moderate pain. 30 tablet 0  . amLODipine (NORVASC) 10 MG tablet Take 1 tablet (10 mg total) by mouth daily. 90 tablet 3  . ASPIRIN LOW DOSE 81 MG EC tablet TAKE 1 TABLET BY MOUTH EVERY DAY 90 tablet 3  . Blood Glucose  Monitoring Suppl (ACCU-CHEK GUIDE ME) w/Device KIT 1 Device by Does not apply route 4 (four) times daily -  before meals and at bedtime. 1 kit 0  . carvedilol (COREG) 12.5 MG tablet Take 1 tablet (12.5 mg total) by mouth 2 (two) times daily. 60 tablet 2  . cholecalciferol (VITAMIN D) 25 MCG (1000 UT) tablet Take 1,000 Units by mouth daily.     . Continuous Blood Gluc Sensor (DEXCOM  G6 SENSOR) MISC 1 each by Does not apply route every other day for 10 days. Use with transmitter to monitor glucose levels 4-5 times per day. Change sensor every 10 days; E11.29 3 each 2  . Continuous Blood Gluc Transmit (DEXCOM G6 TRANSMITTER) MISC 1 each by Does not apply route See admin instructions. Use transmitter to monitor glucose levels 4-5 times per day; E11.29 1 each 0  . Cyanocobalamin 2500 MCG CHEW Chew 2,500 mcg by mouth daily.    . feeding supplement, GLUCERNA SHAKE, (GLUCERNA SHAKE) LIQD Take 237 mLs by mouth 3 (three) times daily between meals.  0  . furosemide (LASIX) 20 MG tablet TAKE 2 TABLETS BY MOUTH IN THE MORNING AND ADDITIONAL 1 TABLET IN AFTERNOON,IF PERSISTENT LEG EDEMA 90 tablet 0  . gabapentin (NEURONTIN) 600 MG tablet TAKE 2 TABLETS BY MOUTH 3 TIMES A DAY 180 tablet 3  . linaclotide (LINZESS) 145 MCG CAPS capsule Take 1 capsule (145 mcg total) by mouth daily as needed (constipation). (Patient taking differently: Take 145 mcg by mouth daily as needed (for constipation). ) 30 capsule 0  . methocarbamol (ROBAXIN) 500 MG tablet TAKE 1 TABLET BY MOUTH TWICE DAILY AS NEEDED FOR MUSCLE SPASM 120 tablet 1  . montelukast (SINGULAIR) 10 MG tablet Take 1 tablet (10 mg total) by mouth at bedtime. 90 tablet 3  . omeprazole (PRILOSEC) 20 MG capsule Take 20 mg by mouth every evening.     . repaglinide (PRANDIN) 0.5 MG tablet Take 1 tablet (0.5 mg total) by mouth 3 (three) times daily before meals. 270 tablet 3  . rosuvastatin (CRESTOR) 20 MG tablet TAKE 1 TABLET (20 MG TOTAL) BY MOUTH DAILY AT 6 PM. 90 tablet 1   . tadalafil (ADCIRCA/CIALIS) 20 MG tablet Take 1 tablet (20 mg total) by mouth daily as needed for erectile dysfunction. 10 tablet 0  . vitamin E (VITAMIN E) 1000 UNIT capsule Take 1,000 Units by mouth daily.      No current facility-administered medications for this visit.     REVIEW OF SYSTEMS:    10 Point review of Systems was done is negative except as noted above.  PHYSICAL EXAMINATION:  . Vitals:   04/23/19 1115  BP: 122/74  Pulse: 75  Resp: 18  Temp: 98.9 F (37.2 C)  SpO2: 100%   Filed Weights   04/23/19 1115  Weight: 214 lb 6.4 oz (97.3 kg)   .Body mass index is 27.53 kg/m.  GENERAL:alert, in no acute distress and comfortable SKIN: no acute rashes, no significant lesions EYES: conjunctiva are pink and non-injected, sclera anicteric OROPHARYNX: MMM, no exudates, no oropharyngeal erythema or ulceration NECK: supple, no JVD LYMPH:  no palpable lymphadenopathy in the cervical, axillary or inguinal regions LUNGS: clear to auscultation b/l with normal respiratory effort HEART: regular rate & rhythm ABDOMEN:  normoactive bowel sounds , non tender, not distended. Extremity: trace pedal edema PSYCH: alert & oriented x 3 with fluent speech NEURO: no focal motor/sensory deficits  LABORATORY DATA:  I have reviewed the data as listed  . CBC Latest Ref Rng & Units 04/23/2019 12/30/2018 12/12/2018  WBC 4.0 - 10.5 K/uL 3.7(L) 5.0 5.3  Hemoglobin 13.0 - 17.0 g/dL 11.0(L) 9.9(L) 8.6(L)  Hematocrit 39.0 - 52.0 % 32.4(L) 29.2(L) 27.3(L)  Platelets 150 - 400 K/uL 187 201.0 225    . CMP Latest Ref Rng & Units 04/23/2019 12/11/2018 12/10/2018  Glucose 70 - 99 mg/dL 233(H) 186(H) 177(H)  BUN 8 - 23 mg/dL 32(H) 32(H) 33(H)  Creatinine 0.61 - 1.24 mg/dL 3.06(HH) 2.21(H) 2.33(H)  Sodium 135 - 145 mmol/L 139 139 139  Potassium 3.5 - 5.1 mmol/L 4.5 4.4 5.1  Chloride 98 - 111 mmol/L 104 107 105  CO2 22 - 32 mmol/L 27 22 21(L)  Calcium 8.9 - 10.3 mg/dL 8.3(L) 7.8(L) 8.0(L)  Total  Protein 6.5 - 8.1 g/dL 6.5 6.2(L) 5.9(L)  Total Bilirubin 0.3 - 1.2 mg/dL 0.3 0.8 0.8  Alkaline Phos 38 - 126 U/L 98 75 76  AST 15 - 41 U/L 11(L) 15 16  ALT 0 - 44 U/L 8 10 10     Recent Creatinine Trend:     04/09/19 CBC w/diff, CMP, Ferritin, SPEP, and SFLC:            RADIOGRAPHIC STUDIES: I have personally reviewed the radiological images as listed and agreed with the findings in the report. No results found.  ASSESSMENT & PLAN:  68 y.o. male with  1. Monoclonal Paraproteinemia PLAN -Discussed patient's most recent labs from 04/09/19, mild anemia with HGB at 11.2 and MCV at 87. PLT and WBC are WNL. Creatinine at 2.87, Calcium at 8.3. Vitamin D deficient at 15.8. M Protein at 0.3g with IgG kappa specificity, and Kappa light chains at 466.6 with K:L ratio of 10.42. About 1.3g of Total Protein in urine per day. -Reviewed pt's creatinine trend, which has slowly increased over time, and do feel that his other risk factors are contributing to the slow progression. -Per nephrology note- Proteinuria thought to be secondary to DM. His CKD is thought to be secondary to HTN, DM, some element of acute kidney injury after STEMI and cardiac cath in 2019, also hx of NSAID use. -Discussed the primary concern to rule out a plasma cell disorder. Discussed that his M Protein of 0.3g is not overtly concerning, however his free Kappa light chains is a little elevated at 466.6 with an abnormal ratio of 10.42. -Discussed that indications for a BM Bx, which the pt does prefer to have for conclusive evaluation and characterization -Will order labs today -Will order Bone survey -Will order BM Bx -Will see the pt back in 3 weeks  2.  Patient Active Problem List   Diagnosis Date Noted  . Coronary artery disease involving native coronary artery of native heart 04/05/2019  . Acute on chronic respiratory failure with hypoxia ([place]) 12/10/2018  . Healthcare-associated pneumonia 12/10/2018  .  Hemoptysis 12/10/2018  . Sepsis ([place]) 12/10/2018  . Acute respiratory failure with hypoxia ([place]) 12/10/2018  . Malnutrition of moderate degree 12/02/2018  . CAD (coronary artery disease) 12/01/2018  . Syncope 12/01/2018  . DOE (dyspnea on exertion) 11/18/2018  . NSTEMI (non-ST elevated myocardial infarction) ([place]) 10/16/2018  . Hypertensive crisis 10/16/2018  . Hypertension   . Hyperlipidemia   . Diabetes mellitus with renal complications ([place])   . Noncompliance with medication regimen   . Chronic pain syndrome 05/29/2018  . Type 2 diabetes mellitus with stage 3 chronic kidney disease, with long-term current use of insulin ([place]) 04/07/2018  . CKD (chronic kidney disease) stage 3, GFR 30-59 ml/min (HCC) 03/11/2018  . Accident due to mechanical fall without injury 11/20/2017  . AKI (acute kidney injury) ([place]) 11/20/2017  . Normocytic normochromic anemia 11/20/2017  . Hypoglycemia 11/19/2017  . Essential hypertension 11/19/2017   -continue f/u with PCP   -Labs today -Whole body bone survey in 1 week -Ct bone marrow aspiration and biopsy in 7-10days -Telephone visit with Dr Irene Limbo in 3 weeks  . Orders  Placed This Encounter  Procedures  . DG Bone Survey Met    Standing Status:   Future    Number of Occurrences:   1    Standing Expiration Date:   06/22/2020    Order Specific Question:   Reason for Exam (SYMPTOM  OR DIAGNOSIS REQUIRED)    Answer:   staging myeloma    Order Specific Question:   Preferred imaging location?    Answer:   [facility]  . CT Biopsy    Standing Status:   Future    Standing Expiration Date:   04/22/2020    Order Specific Question:   Lab orders requested (DO NOT place separate lab orders, these will be automatically ordered during procedure specimen collection):    Answer:   Surgical Pathology    Comments:   flwo cytometry, myeloma FISH panel    Order Specific Question:   Reason for Exam (SYMPTOM  OR DIAGNOSIS REQUIRED)    Answer:   unilateral  bone marrow aspiration and biopsy-patient with monoclonal paraproteinemia and Acute on CKD r/o myeloma and other plasma cell dyscrasias    Order Specific Question:   Preferred location?    Answer:   [facility]    Order Specific Question:   Radiology Contrast Protocol - do NOT remove file path    Answer:   \\[email or web address]  . CT BONE MARROW BIOPSY & ASPIRATION    Standing Status:   Future    Standing Expiration Date:   07/23/2020    Order Specific Question:   Reason for Exam (SYMPTOM  OR DIAGNOSIS REQUIRED)    Answer:   patient with monoclonal paraproteinemia and Acute on CKD r/o myeloma and other plasma cell dyscrasias    Order Specific Question:   Preferred location?    Answer:   [facility]    Order Specific Question:   Radiology Contrast Protocol - do NOT remove file path    Answer:   \\[email or web address]  . CBC with Differential/Platelet    Standing Status:   Future    Number of Occurrences:   1    Standing Expiration Date:   05/27/2020  . CMP ([place] only)    Standing Status:   Future    Number of Occurrences:   1    Standing Expiration Date:   04/22/2020  . Multiple Myeloma Panel (SPEP&IFE w/QIG)    Standing Status:   Future    Number of Occurrences:   1    Standing Expiration Date:   04/22/2020  . Kappa/lambda light chains    Standing Status:   Future    Number of Occurrences:   1    Standing Expiration Date:   05/27/2020  . Sedimentation rate    Standing Status:   Future    Number of Occurrences:   1    Standing Expiration Date:   04/22/2020  . Lactate dehydrogenase    Standing Status:   Future    Number of Occurrences:   1    Standing Expiration Date:   04/22/2020  . Beta 2 microglobulin, serum    Standing Status:   Future    Number of Occurrences:   1    Standing Expiration Date:   04/22/2020     All of the patients questions were answered with apparent satisfaction. The patient knows to call  the clinic with any problems, questions or concerns.  The total time spent in the appt was 45 minutes and more than 50%  was on counseling and direct patient cares.    Sullivan Lone MD MS AAHIVMS [facility] Hematology/Oncology Physician [facility]  (Office):       [phone] (Work cell):  [phone] (Fax):           [phone]  04/23/2019 12:16 PM  I, Baldwin Jamaica, am acting as a scribe for Dr. Sullivan Lone.   .I have reviewed the above documentation for accuracy and completeness, and I agree with the above. Brunetta Genera MD

## 2019-04-22 NOTE — Telephone Encounter (Signed)
Left vm for pt regarding prescreening questions for appointment on 6/25.   ° °

## 2019-04-23 ENCOUNTER — Telehealth: Payer: Self-pay | Admitting: Hematology

## 2019-04-23 ENCOUNTER — Other Ambulatory Visit: Payer: Self-pay

## 2019-04-23 ENCOUNTER — Inpatient Hospital Stay: Payer: BC Managed Care – PPO

## 2019-04-23 ENCOUNTER — Inpatient Hospital Stay: Payer: BC Managed Care – PPO | Attending: Hematology | Admitting: Hematology

## 2019-04-23 VITALS — BP 122/74 | HR 75 | Temp 98.9°F | Resp 18 | Ht 74.0 in | Wt 214.4 lb

## 2019-04-23 DIAGNOSIS — D472 Monoclonal gammopathy: Secondary | ICD-10-CM

## 2019-04-23 DIAGNOSIS — Z87891 Personal history of nicotine dependence: Secondary | ICD-10-CM | POA: Diagnosis not present

## 2019-04-23 DIAGNOSIS — Z7982 Long term (current) use of aspirin: Secondary | ICD-10-CM

## 2019-04-23 DIAGNOSIS — E1140 Type 2 diabetes mellitus with diabetic neuropathy, unspecified: Secondary | ICD-10-CM | POA: Diagnosis not present

## 2019-04-23 DIAGNOSIS — Z79891 Long term (current) use of opiate analgesic: Secondary | ICD-10-CM

## 2019-04-23 DIAGNOSIS — K219 Gastro-esophageal reflux disease without esophagitis: Secondary | ICD-10-CM | POA: Diagnosis not present

## 2019-04-23 DIAGNOSIS — Z8249 Family history of ischemic heart disease and other diseases of the circulatory system: Secondary | ICD-10-CM

## 2019-04-23 DIAGNOSIS — E1122 Type 2 diabetes mellitus with diabetic chronic kidney disease: Secondary | ICD-10-CM | POA: Diagnosis not present

## 2019-04-23 DIAGNOSIS — N183 Chronic kidney disease, stage 3 (moderate): Secondary | ICD-10-CM | POA: Diagnosis not present

## 2019-04-23 DIAGNOSIS — I129 Hypertensive chronic kidney disease with stage 1 through stage 4 chronic kidney disease, or unspecified chronic kidney disease: Secondary | ICD-10-CM | POA: Insufficient documentation

## 2019-04-23 LAB — CMP (CANCER CENTER ONLY)
ALT: 8 U/L (ref 0–44)
AST: 11 U/L — ABNORMAL LOW (ref 15–41)
Albumin: 2.4 g/dL — ABNORMAL LOW (ref 3.5–5.0)
Alkaline Phosphatase: 98 U/L (ref 38–126)
Anion gap: 8 (ref 5–15)
BUN: 32 mg/dL — ABNORMAL HIGH (ref 8–23)
CO2: 27 mmol/L (ref 22–32)
Calcium: 8.3 mg/dL — ABNORMAL LOW (ref 8.9–10.3)
Chloride: 104 mmol/L (ref 98–111)
Creatinine: 3.06 mg/dL (ref 0.61–1.24)
GFR, Est AFR Am: 23 mL/min — ABNORMAL LOW (ref >60)
GFR, Estimated: 20 mL/min — ABNORMAL LOW (ref >60)
Glucose, Bld: 233 mg/dL — ABNORMAL HIGH (ref 70–99)
Potassium: 4.5 mmol/L (ref 3.5–5.1)
Sodium: 139 mmol/L (ref 135–145)
Total Bilirubin: 0.3 mg/dL (ref 0.3–1.2)
Total Protein: 6.5 g/dL (ref 6.5–8.1)

## 2019-04-23 LAB — CBC WITH DIFFERENTIAL/PLATELET
Abs Immature Granulocytes: 0.01 10*3/uL (ref 0.00–0.07)
Basophils Absolute: 0 10*3/uL (ref 0.0–0.1)
Basophils Relative: 1 %
Eosinophils Absolute: 0.1 10*3/uL (ref 0.0–0.5)
Eosinophils Relative: 2 %
HCT: 32.4 % — ABNORMAL LOW (ref 39.0–52.0)
Hemoglobin: 11 g/dL — ABNORMAL LOW (ref 13.0–17.0)
Immature Granulocytes: 0 %
Lymphocytes Relative: 23 %
Lymphs Abs: 0.9 10*3/uL (ref 0.7–4.0)
MCH: 29.2 pg (ref 26.0–34.0)
MCHC: 34 g/dL (ref 30.0–36.0)
MCV: 85.9 fL (ref 80.0–100.0)
Monocytes Absolute: 0.2 10*3/uL (ref 0.1–1.0)
Monocytes Relative: 6 %
Neutro Abs: 2.5 10*3/uL (ref 1.7–7.7)
Neutrophils Relative %: 68 %
Platelets: 187 10*3/uL (ref 150–400)
RBC: 3.77 MIL/uL — ABNORMAL LOW (ref 4.22–5.81)
RDW: 13.1 % (ref 11.5–15.5)
WBC: 3.7 10*3/uL — ABNORMAL LOW (ref 4.0–10.5)
nRBC: 0 % (ref 0.0–0.2)

## 2019-04-23 LAB — SEDIMENTATION RATE: Sed Rate: 84 mm/hr — ABNORMAL HIGH (ref 0–16)

## 2019-04-23 LAB — LACTATE DEHYDROGENASE: LDH: 219 U/L — ABNORMAL HIGH (ref 98–192)

## 2019-04-23 NOTE — Progress Notes (Signed)
Notified by lab that Cr 3.06. Dr.Kale made aware.

## 2019-04-23 NOTE — Telephone Encounter (Signed)
Scheduled appt per 6/25 los. Printed calendar and avs.

## 2019-04-23 NOTE — Patient Instructions (Signed)
Discussed that we are evaluating your abnormal protein for a plasma cell disorder, or to rule out having a plasma cell disorder.  We will evaluate you with: Blood tests today. Bone marrow biopsy. Whole body Xray.

## 2019-04-24 ENCOUNTER — Ambulatory Visit (HOSPITAL_COMMUNITY)
Admission: RE | Admit: 2019-04-24 | Discharge: 2019-04-24 | Disposition: A | Discharge status: Home / Self Care | Payer: BC Managed Care – PPO | Source: Ambulatory Visit | Attending: Hematology | Admitting: Hematology

## 2019-04-24 DIAGNOSIS — D472 Monoclonal gammopathy: Secondary | ICD-10-CM

## 2019-04-24 DIAGNOSIS — C9000 Multiple myeloma not having achieved remission: Secondary | ICD-10-CM | POA: Diagnosis not present

## 2019-04-24 LAB — BETA 2 MICROGLOBULIN, SERUM: Beta-2 Microglobulin: 7.8 mg/L — ABNORMAL HIGH (ref 0.6–2.4)

## 2019-04-24 LAB — KAPPA/LAMBDA LIGHT CHAINS
Kappa free light chain: 502.8 mg/L — ABNORMAL HIGH (ref 3.3–19.4)
Kappa, lambda light chain ratio: 10.22 — ABNORMAL HIGH (ref 0.26–1.65)
Lambda free light chains: 49.2 mg/L — ABNORMAL HIGH (ref 5.7–26.3)

## 2019-04-26 NOTE — Progress Notes (Signed)
[facility] Note  04/28/2019     CHIEF COMPLAINT Patient presents for Retina Follow Up   HISTORY OF PRESENT ILLNESS: William Webb is a 68 y.o. male who presents to the clinic today for:   HPI    Retina Follow Up    Patient presents with  Diabetic Retinopathy.  In left eye.  This started 3 weeks ago.  Severity is moderate.  I, the attending physician,  performed the HPI with the patient and updated documentation appropriately.          Comments    Patient here for 3 weeks retina follow up for PDR OS. Patient states vision doing the same, cloudy. On occasion sees like a movie screen. Stopped driving at night and when it rains. Sometimes eyes dry. Feel sore sometimes.        Last edited by [name], MD on 04/28/2019  5:22 PM. (History)    Pt states he saw Dr. [name] recently who has scheduled him for surgery for cataracts and glaucoma.  Referring physician: Billie Ruddy, MD [place],  [place] [address]  HISTORICAL INFORMATION:   Selected notes from the MEDICAL RECORD NUMBER Referred by Dr. [name] for DM exam LEE:  Ocular Hx-cataracts OU PMH-DM (A1c: 7.8 [05.07.20], takes repaglinide, tresiba), CHF,CKD, CAD, HLD, HTN, MI   CURRENT MEDICATIONS: Current Outpatient Medications (Ophthalmic Drugs)  Medication Sig  . prednisoLONE acetate (PRED FORTE) 1 % ophthalmic suspension 1 drop 4 times daily for 7 days in the left eye then stop   No current facility-administered medications for this visit.  (Ophthalmic Drugs)   Current Outpatient Medications (William)  Medication Sig  . acetaminophen (TYLENOL) 500 MG tablet Take 2 tablets (1,000 mg total) by mouth 2 (two) times daily as needed for moderate pain.  Marland Kitchen amLODipine (NORVASC) 10 MG tablet Take 1 tablet (10 mg total) by mouth daily.  . ASPIRIN LOW DOSE 81 MG EC tablet TAKE 1 TABLET BY MOUTH EVERY DAY  . Blood Glucose Monitoring Suppl (ACCU-CHEK GUIDE ME)  w/Device KIT 1 Device by Does not apply route 4 (four) times daily -  before meals and at bedtime.  . carvedilol (COREG) 12.5 MG tablet Take 1 tablet (12.5 mg total) by mouth 2 (two) times daily.  . cholecalciferol (VITAMIN D) 25 MCG (1000 UT) tablet Take 1,000 Units by mouth daily.   . Continuous Blood Gluc Sensor (DEXCOM G6 SENSOR) MISC 1 each by Does not apply route every William day for 10 days. Use with transmitter to monitor glucose levels 4-5 times per day. Change sensor every 10 days; E11.29  . Continuous Blood Gluc Transmit (DEXCOM G6 TRANSMITTER) MISC 1 each by Does not apply route See admin instructions. Use transmitter to monitor glucose levels 4-5 times per day; E11.29  . Cyanocobalamin 2500 MCG CHEW Chew 2,500 mcg by mouth daily.  . feeding supplement, GLUCERNA SHAKE, (GLUCERNA SHAKE) LIQD Take 237 mLs by mouth 3 (three) times daily between meals.  . furosemide (LASIX) 20 MG tablet TAKE 2 TABLETS BY MOUTH IN THE MORNING AND ADDITIONAL 1 TABLET IN AFTERNOON,IF PERSISTENT LEG EDEMA  . gabapentin (NEURONTIN) 600 MG tablet TAKE 2 TABLETS BY MOUTH 3 TIMES A DAY  . linaclotide (LINZESS) 145 MCG CAPS capsule Take 1 capsule (145 mcg total) by mouth daily as needed (constipation). (Patient taking differently: Take 145 mcg by mouth daily as needed (for constipation). )  . methocarbamol (ROBAXIN) 500 MG tablet TAKE 1  TABLET BY MOUTH TWICE DAILY AS NEEDED FOR MUSCLE SPASM  . montelukast (SINGULAIR) 10 MG tablet Take 1 tablet (10 mg total) by mouth at bedtime.  Marland Kitchen omeprazole (PRILOSEC) 20 MG capsule Take 20 mg by mouth every evening.   . repaglinide (PRANDIN) 0.5 MG tablet Take 1 tablet (0.5 mg total) by mouth 3 (three) times daily before meals.  . rosuvastatin (CRESTOR) 20 MG tablet TAKE 1 TABLET (20 MG TOTAL) BY MOUTH DAILY AT 6 PM.  . tadalafil (ADCIRCA/CIALIS) 20 MG tablet Take 1 tablet (20 mg total) by mouth daily as needed for erectile dysfunction.  . vitamin E (VITAMIN E) 1000 UNIT capsule Take  1,000 Units by mouth daily.    No current facility-administered medications for this visit.  (William)      REVIEW OF SYSTEMS: ROS    Positive for: Endocrine, Cardiovascular, Eyes   Negative for: Constitutional, Gastrointestinal, Neurological, Skin, Genitourinary, Musculoskeletal, HENT, Respiratory, Psychiatric, Allergic/Imm, Heme/Lymph   Last edited by Theodore Demark on 04/28/2019  9:39 AM. (History)       ALLERGIES No Known Allergies  PAST MEDICAL HISTORY Past Medical History:  Diagnosis Date  . Allergy   . Anemia   . Blood transfusion without reported diagnosis   . Cataract   . CHF (congestive heart failure) ([place])   . CKD (chronic kidney disease) stage 3, GFR 30-59 ml/min ([facility])   . Coronary artery disease   . Diabetic peripheral neuropathy ([place])   . GERD (gastroesophageal reflux disease)   . Hyperlipidemia   . Hypertension   . Hypertensive crisis 10/16/2018  . Myocardial infarction ([place])   . Noncompliance with medication regimen   . Osteoarthritis    "legs, back" (10/16/2018)  . Osteoporosis   . Pneumonia 12-Dec-2015   "real bad; I died and they had to bring me back" (10/16/2018)  . Seasonal allergies   . Type II diabetes mellitus ([place])    Past Surgical History:  Procedure Laterality Date  . CARDIAC CATHETERIZATION  10/20/2018  . CORONARY BALLOON ANGIOPLASTY N/A 10/20/2018   Procedure: CORONARY BALLOON ANGIOPLASTY;  Surgeon: Nigel Mormon, MD;  Location: [facility];  Service: Cardiovascular;  Laterality: N/A;  . JOINT REPLACEMENT    . LEFT HEART CATH AND CORONARY ANGIOGRAPHY N/A 10/20/2018   Procedure: LEFT HEART CATH AND CORONARY ANGIOGRAPHY;  Surgeon: Nigel Mormon, MD;  Location: [facility];  Service: Cardiovascular;  Laterality: N/A;  . TOTAL KNEE ARTHROPLASTY Right     FAMILY HISTORY Family History  Problem Relation Age of Onset  . Hypertension Mother   . Diabetes Mother   . Hyperlipidemia Mother   . Hypertension Father   .  Hypertension Sister   . Cancer Sister   . Colon cancer Brother   . Esophageal cancer Neg Hx   . Stomach cancer Neg Hx   . Rectal cancer Neg Hx     SOCIAL HISTORY Social History   Tobacco Use  . Smoking status: Former Smoker    Packs/day: 0.33    Years: 20.00    Pack years: 6.60    Types: Cigarettes    Quit date: 1985    Years since quitting: 35.5  . Smokeless tobacco: Never Used  Substance Use Topics  . Alcohol use: Yes    Comment: 10/16/2018 "couple drinks/year"  . Drug use: Never         OPHTHALMIC EXAM:  Base Eye Exam    Visual Acuity (Snellen - Linear)  Right Left   Dist [place] 20/40 -2 20/30   Dist ph [place] 20/25 -1 20/25 -1       Tonometry (Tonopen, 9:33 AM)      Right Left   Pressure 19 18       Pupils      Dark Light Shape React APD   Right 3 2 Round Brisk None   Left 3 2 Round Brisk None       Visual Fields (Counting fingers)      Left Right    Full Full       Extraocular Movement      Right Left    Full, Ortho Full, Ortho       Neuro/Psych    Oriented x3: Yes   Mood/Affect: Normal       Dilation    Both eyes: 1.0% Mydriacyl, 2.5% Phenylephrine @ 9:33 AM        Slit Lamp and Fundus Exam    Slit Lamp Exam      Right Left   Lids/Lashes Dermatochalasis - upper lid, Meibomian gland dysfunction Dermatochalasis - upper lid, Meibomian gland dysfunction   Conjunctiva/Sclera Mild Melanosis, mild Conjunctivochalasis, 0.7 mm lesion temporal conj Mild Melanosis, inferior Conjunctivochalasis   Cornea 1+ Punctate epithelial erosions, 1+ pigment guttata 2-3+pigmented gutta1+ Punctate epithelial erosions, 1+ endo pigment   Anterior Chamber moderate depth, narrow angles moderate depth, narrow angles   Iris Round and dilated, No NVI Round and dilated, No NVI   Lens 2+ Nuclear sclerosis, 3+ Cortical cataract 2-3+ Nuclear sclerosis, 3+ Cortical cataract   Vitreous Mild Vitreous syneresis Mild Vitreous syneresis       Fundus Exam      Right Left    Disc Pink and Sharp, +cupping, temporal PPA Sharp rim, mild pallor, +cupping, mild temporal PPA   C/D Ratio 0.7 0.6   Macula Flat, Blunted foveal reflex, mild MA/exudate temporal macula Flat, Blunted foveal reflex, mild RPE mottling, mild MA/exudate temporal macula   Vessels Vascular attenuation, mild Tortuousity Vascular attenuation, mild Tortuousity   Periphery Attached, scattered IRH/exudate, scattered RPE changes   Attached, scattered IRH/exudate, scattered RPE changes          Refraction    Wearing Rx      Sphere   Right OTC readers   Left OTC readers          IMAGING AND PROCEDURES  Imaging and Procedures for _0 @  OCT, Retina - OU - Both Eyes       Right Eye Quality was good. Central Foveal Thickness: 219. Progression has been stable. Findings include normal foveal contour, no SRF, intraretinal fluid, intraretinal hyper-reflective material (Cluster of IRF/IRHM temporal macula).   Left Eye Quality was good. Central Foveal Thickness: 243. Progression has been stable. Findings include abnormal foveal contour, vitreous traction, intraretinal fluid, intraretinal hyper-reflective material, no SRF (Clusters of IRF and IRHM temporal macula, non-central, mild VMT).   Notes *Images captured and stored on drive  Diagnosis / Impression:  Non-central DME OU Clusters of IRF/IRHM temporal macula OU   Clinical management:  See below  Abbreviations: NFP - Normal foveal profile. CME - cystoid macular edema. PED - pigment epithelial detachment. IRF - intraretinal fluid. SRF - subretinal fluid. EZ - ellipsoid zone. ERM - epiretinal membrane. ORA - outer retinal atrophy. ORT - outer retinal tubulation. SRHM - subretinal hyper-reflective material        Panretinal Photocoagulation - OS - Left Eye       LASER PROCEDURE NOTE  Diagnosis:   Proliferative Diabetic Retinopathy, LEFT EYE  Procedure:  Pan-retinal photocoagulation using slit lamp laser, LEFT EYE  Anesthesia:   Topical  Surgeon: [name], MD, PhD   Informed consent obtained, operative eye marked, and time out performed prior to initiation of laser.   Lumenis [id number] slit lamp laser Pattern: 3x3 square Power: 300 mW Duration: 30 msec  Spot size: 200 microns  # spots: [phone] spots   Complications: None.  Notes: significant cortical cataract obscuring view and preventing laser up take in far periphery and scattered focal areas  RTC: 6 wks  Patient tolerated the procedure well and received written and verbal post-procedure care information/education.                  ASSESSMENT/PLAN:    ICD-10-CM   1. Proliferative diabetic retinopathy of left eye with macular edema associated with type 2 diabetes mellitus ([place])  I96.7893 Panretinal Photocoagulation - OS - Left Eye  2. 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 Retinal edema  H35.81 OCT, Retina - OU - Both Eyes  4. Vitreomacular traction, left  H43.822   5. Essential hypertension  I10   6. Hypertensive retinopathy of both eyes  H35.033   7. Combined forms of age-related cataract of both eyes  H25.813   8. Glaucoma suspect of both eyes  H40.003     1-3. PDR w/ macular edema OS        Severe nonproliferative diabetic retinopathy w/ DME OD - The incidence, risk factors for progression, natural history and treatment options for diabetic retinopathy  were discussed with patient.   - The need for close monitoring of blood glucose, blood pressure, and serum lipids, avoiding cigarette or any type of tobacco, and the need for long term follow up was also discussed with patient. - exam shows scattered IRH/DBH OU - FA shows capillary nonperfusion OU; late leaking MA OU; +NVE inf nasal quads OS only (OD no NV) - OCT shows noncentral diabetic macular edema, both eyes - recommend holding off on DME treatment for now - recommend PRP OS for  PDR OS today 6.30.20 - RBA of procedure discussed, questions answered - informed consent obtained and signed - see procedure note - f/u in 6 wks for DFE/OCT/likely PRP OD  4. VMT OS - mild traction centrally with minimal distortion of fovea - no IRF/SRF  5,6. Hypertensive retinopathy OU  - discussed importance of tight BP control  - monitor  7. Mixed form age related cataract OU  - The symptoms of cataract, surgical options, and treatments and risks were discussed with patient.  - discussed diagnosis and progression  - pt reports significant glare symptoms  - referred to and now under the expert management of Dr. Midge Aver  8. Glaucoma Suspect  - borderline IOPs OU (17, 20)  - significant cupping and pallor of disc OU  - now under the expert management of Dr. Midge Aver    Ophthalmic Meds Ordered this visit:  Meds ordered this encounter  Medications  . prednisoLONE acetate (PRED FORTE) 1 % ophthalmic suspension    Sig: 1 drop 4 times daily for 7 days in the left eye then stop    Dispense:  10 mL    Refill:  0       Return in about 6 weeks (around 06/09/2019) for Dilated Exam, OCT.  There are no Patient Instructions on file  for this visit.   Explained the diagnoses, plan, and follow up with the patient and they expressed understanding.  Patient expressed understanding of the importance of proper follow up care.   This document serves as a record of services personally performed by  G. , MD, PhD. It was created on their behalf by Amanda Brown, OA, an ophthalmic assistant. The creation of this record is the provider's dictation and/or activities during the visit.    Electronically signed by: Amanda Brown, OA 06.28.2020 5:27 PM     G. , M.D., Ph.D. Diseases & Surgery of the Retina and Vitreous [facility]  I have reviewed the above documentation for accuracy and completeness, and I agree with the above.  G. ,  M.D., Ph.D. 04/28/19 5:27 PM   Abbreviations: M myopia (nearsighted); A astigmatism; H hyperopia (farsighted); P presbyopia; Mrx spectacle prescription;  CTL contact lenses; OD right eye; OS left eye; OU both eyes  XT exotropia; ET esotropia; PEK punctate epithelial keratitis; PEE punctate epithelial erosions; DES dry eye syndrome; MGD meibomian gland dysfunction; ATs artificial tears; PFAT's preservative free artificial tears; NSC nuclear sclerotic cataract; PSC posterior subcapsular cataract; ERM epi-retinal membrane; PVD posterior vitreous detachment; RD retinal detachment; DM diabetes mellitus; DR diabetic retinopathy; NPDR non-proliferative diabetic retinopathy; PDR proliferative diabetic retinopathy; CSME clinically significant macular edema; DME diabetic macular edema; dbh dot blot hemorrhages; CWS cotton wool spot; POAG primary open angle glaucoma; C/D cup-to-disc ratio; HVF humphrey visual field; GVF goldmann visual field; OCT optical coherence tomography; IOP intraocular pressure; BRVO Branch retinal vein occlusion; CRVO central retinal vein occlusion; CRAO central retinal artery occlusion; BRAO branch retinal artery occlusion; RT retinal tear; SB scleral buckle; PPV pars plana vitrectomy; VH Vitreous hemorrhage; PRP panretinal laser photocoagulation; IVK intravitreal kenalog; VMT vitreomacular traction; MH Macular hole;  NVD neovascularization of the disc; NVE neovascularization elsewhere; AREDS age related eye disease study; ARMD age related macular degeneration; POAG primary open angle glaucoma; EBMD epithelial/anterior basement membrane dystrophy; ACIOL anterior chamber intraocular lens; IOL intraocular lens; PCIOL posterior chamber intraocular lens; Phaco/IOL phacoemulsification with intraocular lens placement; PRK photorefractive keratectomy; LASIK laser assisted in situ keratomileusis; HTN hypertension; DM diabetes mellitus; COPD chronic obstructive pulmonary disease  

## 2019-04-27 DIAGNOSIS — H25813 Combined forms of age-related cataract, bilateral: Secondary | ICD-10-CM | POA: Diagnosis not present

## 2019-04-27 DIAGNOSIS — E113491 Type 2 diabetes mellitus with severe nonproliferative diabetic retinopathy without macular edema, right eye: Secondary | ICD-10-CM | POA: Diagnosis not present

## 2019-04-27 DIAGNOSIS — E113592 Type 2 diabetes mellitus with proliferative diabetic retinopathy without macular edema, left eye: Secondary | ICD-10-CM | POA: Diagnosis not present

## 2019-04-27 DIAGNOSIS — H40023 Open angle with borderline findings, high risk, bilateral: Secondary | ICD-10-CM | POA: Diagnosis not present

## 2019-04-27 DIAGNOSIS — H5703 Miosis: Secondary | ICD-10-CM | POA: Diagnosis not present

## 2019-04-27 LAB — MULTIPLE MYELOMA PANEL, SERUM
Albumin SerPl Elph-Mcnc: 2.6 g/dL — ABNORMAL LOW (ref 2.9–4.4)
Albumin/Glob SerPl: 0.9 (ref 0.7–1.7)
Alpha 1: 0.2 g/dL (ref 0.0–0.4)
Alpha2 Glob SerPl Elph-Mcnc: 0.9 g/dL (ref 0.4–1.0)
B-Globulin SerPl Elph-Mcnc: 1 g/dL (ref 0.7–1.3)
Gamma Glob SerPl Elph-Mcnc: 0.8 g/dL (ref 0.4–1.8)
Globulin, Total: 2.9 g/dL (ref 2.2–3.9)
IgA: 276 mg/dL (ref 61–437)
IgG (Immunoglobin G), Serum: 925 mg/dL (ref 603–1613)
IgM (Immunoglobulin M), Srm: 96 mg/dL (ref 20–172)
M Protein SerPl Elph-Mcnc: 0.3 g/dL — ABNORMAL HIGH
Total Protein ELP: 5.5 g/dL — ABNORMAL LOW (ref 6.0–8.5)

## 2019-04-28 ENCOUNTER — Other Ambulatory Visit: Payer: Self-pay

## 2019-04-28 ENCOUNTER — Encounter (INDEPENDENT_AMBULATORY_CARE_PROVIDER_SITE_OTHER): Payer: Self-pay | Admitting: Ophthalmology

## 2019-04-28 ENCOUNTER — Ambulatory Visit (INDEPENDENT_AMBULATORY_CARE_PROVIDER_SITE_OTHER): Payer: BC Managed Care – PPO | Admitting: Ophthalmology

## 2019-04-28 ENCOUNTER — Other Ambulatory Visit: Payer: Self-pay | Admitting: Radiology

## 2019-04-28 DIAGNOSIS — H3581 Retinal edema: Secondary | ICD-10-CM | POA: Diagnosis not present

## 2019-04-28 DIAGNOSIS — E113512 Type 2 diabetes mellitus with proliferative diabetic retinopathy with macular edema, left eye: Secondary | ICD-10-CM | POA: Diagnosis not present

## 2019-04-28 DIAGNOSIS — I10 Essential (primary) hypertension: Secondary | ICD-10-CM

## 2019-04-28 DIAGNOSIS — H35033 Hypertensive retinopathy, bilateral: Secondary | ICD-10-CM

## 2019-04-28 DIAGNOSIS — H25813 Combined forms of age-related cataract, bilateral: Secondary | ICD-10-CM

## 2019-04-28 DIAGNOSIS — H40003 Preglaucoma, unspecified, bilateral: Secondary | ICD-10-CM

## 2019-04-28 DIAGNOSIS — H43822 Vitreomacular adhesion, left eye: Secondary | ICD-10-CM

## 2019-04-28 DIAGNOSIS — E113411 Type 2 diabetes mellitus with severe nonproliferative diabetic retinopathy with macular edema, right eye: Secondary | ICD-10-CM

## 2019-04-28 MED ORDER — PREDNISOLONE ACETATE 1 % OP SUSP: OPHTHALMIC | 0 refills | Status: DC

## 2019-04-29 ENCOUNTER — Other Ambulatory Visit: Payer: Self-pay | Admitting: Radiology

## 2019-04-30 ENCOUNTER — Other Ambulatory Visit: Payer: Self-pay

## 2019-04-30 ENCOUNTER — Ambulatory Visit (HOSPITAL_COMMUNITY)
Admission: RE | Admit: 2019-04-30 | Discharge: 2019-04-30 | Disposition: A | Discharge status: Home / Self Care | Payer: BC Managed Care – PPO | Source: Ambulatory Visit | Attending: Hematology | Admitting: Hematology

## 2019-04-30 ENCOUNTER — Ambulatory Visit (HOSPITAL_COMMUNITY)
Admission: RE | Admit: 2019-04-30 | Discharge: 2019-04-30 | Disposition: A | Discharge status: Home / Self Care | Payer: BC Managed Care – PPO | Source: Ambulatory Visit | Attending: Urology | Admitting: Urology

## 2019-04-30 ENCOUNTER — Encounter (HOSPITAL_COMMUNITY): Payer: Self-pay

## 2019-04-30 DIAGNOSIS — E1122 Type 2 diabetes mellitus with diabetic chronic kidney disease: Secondary | ICD-10-CM | POA: Diagnosis not present

## 2019-04-30 DIAGNOSIS — E1142 Type 2 diabetes mellitus with diabetic polyneuropathy: Secondary | ICD-10-CM | POA: Insufficient documentation

## 2019-04-30 DIAGNOSIS — D472 Monoclonal gammopathy: Secondary | ICD-10-CM

## 2019-04-30 DIAGNOSIS — D649 Anemia, unspecified: Secondary | ICD-10-CM | POA: Insufficient documentation

## 2019-04-30 DIAGNOSIS — I130 Hypertensive heart and chronic kidney disease with heart failure and stage 1 through stage 4 chronic kidney disease, or unspecified chronic kidney disease: Secondary | ICD-10-CM | POA: Diagnosis not present

## 2019-04-30 DIAGNOSIS — I509 Heart failure, unspecified: Secondary | ICD-10-CM | POA: Diagnosis not present

## 2019-04-30 DIAGNOSIS — D72822 Plasmacytosis: Secondary | ICD-10-CM | POA: Diagnosis not present

## 2019-04-30 DIAGNOSIS — N183 Chronic kidney disease, stage 3 (moderate): Secondary | ICD-10-CM | POA: Diagnosis not present

## 2019-04-30 DIAGNOSIS — M1990 Unspecified osteoarthritis, unspecified site: Secondary | ICD-10-CM | POA: Diagnosis present

## 2019-04-30 DIAGNOSIS — M810 Age-related osteoporosis without current pathological fracture: Secondary | ICD-10-CM | POA: Diagnosis not present

## 2019-04-30 LAB — CBC WITH DIFFERENTIAL/PLATELET
Abs Immature Granulocytes: 0.01 10*3/uL (ref 0.00–0.07)
Basophils Absolute: 0 10*3/uL (ref 0.0–0.1)
Basophils Relative: 1 %
Eosinophils Absolute: 0.1 10*3/uL (ref 0.0–0.5)
Eosinophils Relative: 1 %
HCT: 32.4 % — ABNORMAL LOW (ref 39.0–52.0)
Hemoglobin: 10.7 g/dL — ABNORMAL LOW (ref 13.0–17.0)
Immature Granulocytes: 0 %
Lymphocytes Relative: 21 %
Lymphs Abs: 0.8 10*3/uL (ref 0.7–4.0)
MCH: 28.8 pg (ref 26.0–34.0)
MCHC: 33 g/dL (ref 30.0–36.0)
MCV: 87.3 fL (ref 80.0–100.0)
Monocytes Absolute: 0.3 10*3/uL (ref 0.1–1.0)
Monocytes Relative: 7 %
Neutro Abs: 2.8 10*3/uL (ref 1.7–7.7)
Neutrophils Relative %: 70 %
Platelets: 177 10*3/uL (ref 150–400)
RBC: 3.71 MIL/uL — ABNORMAL LOW (ref 4.22–5.81)
RDW: 13.1 % (ref 11.5–15.5)
WBC: 4 10*3/uL (ref 4.0–10.5)
nRBC: 0 % (ref 0.0–0.2)

## 2019-04-30 LAB — BASIC METABOLIC PANEL
Anion gap: 6 (ref 5–15)
BUN: 35 mg/dL — ABNORMAL HIGH (ref 8–23)
CO2: 27 mmol/L (ref 22–32)
Calcium: 8.3 mg/dL — ABNORMAL LOW (ref 8.9–10.3)
Chloride: 104 mmol/L (ref 98–111)
Creatinine, Ser: 2.8 mg/dL — ABNORMAL HIGH (ref 0.61–1.24)
GFR calc Af Amer: 26 mL/min — ABNORMAL LOW (ref >60)
GFR calc non Af Amer: 22 mL/min — ABNORMAL LOW (ref >60)
Glucose, Bld: 253 mg/dL — ABNORMAL HIGH (ref 70–99)
Potassium: 4.4 mmol/L (ref 3.5–5.1)
Sodium: 137 mmol/L (ref 135–145)

## 2019-04-30 LAB — PROTIME-INR
INR: 0.9 (ref 0.8–1.2)
Prothrombin Time: 12.4 seconds (ref 11.4–15.2)

## 2019-04-30 LAB — GLUCOSE, CAPILLARY: Glucose-Capillary: 221 mg/dL — ABNORMAL HIGH (ref 70–99)

## 2019-04-30 MED ORDER — MIDAZOLAM HCL 2 MG/2ML IJ SOLN
INTRAMUSCULAR | Status: AC
  Filled 2019-04-30: qty 4

## 2019-04-30 MED ORDER — SODIUM CHLORIDE 0.9 % IV SOLN
INTRAVENOUS | Status: DC
  Administered 2019-04-30: 08:00:00 via INTRAVENOUS

## 2019-04-30 MED ORDER — FLUMAZENIL 0.5 MG/5ML IV SOLN
INTRAVENOUS | Status: AC
  Filled 2019-04-30: qty 5

## 2019-04-30 MED ORDER — NALOXONE HCL 0.4 MG/ML IJ SOLN
INTRAMUSCULAR | Status: AC
  Filled 2019-04-30: qty 1

## 2019-04-30 MED ORDER — FENTANYL CITRATE (PF) 100 MCG/2ML IJ SOLN
INTRAMUSCULAR | Status: AC
  Filled 2019-04-30: qty 2

## 2019-04-30 MED ORDER — LIDOCAINE-EPINEPHRINE (PF) 2 %-1:200000 IJ SOLN
INTRAMUSCULAR | Status: AC | PRN
  Administered 2019-04-30: 10 mL

## 2019-04-30 MED ORDER — FENTANYL CITRATE (PF) 100 MCG/2ML IJ SOLN
INTRAMUSCULAR | Status: AC | PRN
  Administered 2019-04-30 (×2): 50 ug via INTRAVENOUS

## 2019-04-30 MED ORDER — MIDAZOLAM HCL 2 MG/2ML IJ SOLN
INTRAMUSCULAR | Status: AC | PRN
  Administered 2019-04-30 (×2): 1 mg via INTRAVENOUS

## 2019-04-30 NOTE — Procedures (Signed)
Pre-procedure Diagnosis: Monoclonal paraproteinemia Post-procedure Diagnosis: Same  Technically successful CT guided bone marrow aspiration and biopsy of left iliac crest.   Complications: None Immediate  EBL: None  Signed: Sandi Mariscal Pager: [phone] 04/30/2019, 9:26 AM
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
## 2019-04-30 NOTE — Discharge Instructions (Signed)
Moderate Conscious Sedation, Adult, Care After These instructions provide you with information about caring for yourself after your procedure. Your health care provider may also give you more specific instructions. Your treatment has been planned according to current medical practices, but problems sometimes occur. Call your health care provider if you have any problems or questions after your procedure. What can I expect after the procedure? After your procedure, it is common:  To feel sleepy for several hours.  To feel clumsy and have poor balance for several hours.  To have poor judgment for several hours.  To vomit if you eat too soon. Follow these instructions at home: For at least 24 hours after the procedure:   Do not: ? Participate in activities where you could fall or become injured. ? Drive. ? Use heavy machinery. ? Drink alcohol. ? Take sleeping pills or medicines that cause drowsiness. ? Make important decisions or sign legal documents. ? Take care of children on your own.  Rest. Eating and drinking  Follow the diet recommended by your health care provider.  If you vomit: ? Drink water, juice, or soup when you can drink without vomiting. ? Make sure you have little or no nausea before eating solid foods. General instructions  Have a responsible adult stay with you until you are awake and alert.  Take over-the-counter and prescription medicines only as told by your health care provider.  If you smoke, do not smoke without supervision.  Keep all follow-up visits as told by your health care provider. This is important. Contact a health care provider if:  You keep feeling nauseous or you keep vomiting.  You feel light-headed.  You develop a rash.  You have a fever. Get help right away if:  You have trouble breathing. This information is not intended to replace advice given to you by your health care provider. Make sure you discuss any questions you have  with your health care provider. Document Released: 08/05/2013 Document Revised: 09/27/2017 Document Reviewed: 02/04/2016 Elsevier Patient Education  2020 [employer].   Bone Marrow Aspiration and Bone Marrow Biopsy, Adult, Care After This sheet gives you information about how to care for yourself after your procedure. Your health care provider may also give you more specific instructions. If you have problems or questions, contact your health care provider. What can I expect after the procedure? After the procedure, it is common to have:  Mild pain and tenderness.  Swelling.  Bruising. Follow these instructions at home: Puncture site care      Follow instructions from your health care provider about how to take care of the puncture site. Make sure you: ? Wash your hands with soap and water before you change your bandage (dressing). If soap and water are not available, use hand sanitizer. ? Change your dressing as told by your health care provider.  You may shower and charge your dressing tomorrow.  Check your puncture siteevery day for signs of infection. Check for: ? More redness, swelling, or pain. ? More fluid or blood. ? Warmth. ? Pus or a bad smell. General instructions  Take over-the-counter and prescription medicines only as told by your health care provider.  Do not take baths, swim, or use a hot tub until your health care provider approves. Ask if you can take a shower or have a sponge bath.  Return to your normal activities as told by your health care provider. Ask your health care provider what activities are safe for you.  Do not drive for 24 hours if you were given a medicine to help you relax (sedative) during your procedure.  Keep all follow-up visits as told by your health care provider. This is important. Contact a health care provider if:  Your pain is not controlled with medicine. Get help right away if:  You have a fever.  You have more redness,  swelling, or pain around the puncture site.  You have more fluid or blood coming from the puncture site.  Your puncture site feels warm to the touch.  You have pus or a bad smell coming from the puncture site. These symptoms may represent a serious problem that is an emergency. Do not wait to see if the symptoms will go away. Get medical help right away. Call your local emergency services (911 in the [place] Do not drive yourself to the hospital. Summary  After the procedure, it is common to have mild pain, tenderness, swelling, and bruising.  Follow instructions from your health care provider about how to take care of the puncture site.  Get help right away if you have any symptoms of infection or if you have more blood or fluid coming from the puncture site. This information is not intended to replace advice given to you by your health care provider. Make sure you discuss any questions you have with your health care provider. Document Released: 05/04/2005 Document Revised: 01/28/2018 Document Reviewed: 03/28/2016 Elsevier Patient Education  2020 [employer].

## 2019-05-06 ENCOUNTER — Ambulatory Visit (INDEPENDENT_AMBULATORY_CARE_PROVIDER_SITE_OTHER): Payer: BC Managed Care – PPO | Admitting: Endocrinology

## 2019-05-06 ENCOUNTER — Encounter: Payer: Self-pay | Admitting: Endocrinology

## 2019-05-06 ENCOUNTER — Other Ambulatory Visit: Payer: Self-pay

## 2019-05-06 VITALS — BP 150/72 | HR 77 | Ht 74.0 in | Wt 212.4 lb

## 2019-05-06 DIAGNOSIS — N529 Male erectile dysfunction, unspecified: Secondary | ICD-10-CM | POA: Insufficient documentation

## 2019-05-06 DIAGNOSIS — E1122 Type 2 diabetes mellitus with diabetic chronic kidney disease: Secondary | ICD-10-CM | POA: Diagnosis not present

## 2019-05-06 DIAGNOSIS — N183 Chronic kidney disease, stage 3 unspecified: Secondary | ICD-10-CM

## 2019-05-06 DIAGNOSIS — Z794 Long term (current) use of insulin: Secondary | ICD-10-CM

## 2019-05-06 LAB — BASIC METABOLIC PANEL
BUN: 33 mg/dL — ABNORMAL HIGH (ref 6–23)
CO2: 26 mEq/L (ref 19–32)
Calcium: 7.7 mg/dL — ABNORMAL LOW (ref 8.4–10.5)
Chloride: 104 mEq/L (ref 96–112)
Creatinine, Ser: 3.01 mg/dL — ABNORMAL HIGH (ref 0.40–1.50)
GFR: 25.1 mL/min — ABNORMAL LOW (ref >60.00)
Glucose, Bld: 300 mg/dL — ABNORMAL HIGH (ref 70–99)
Potassium: 4.5 mEq/L (ref 3.5–5.1)
Sodium: 138 mEq/L (ref 135–145)

## 2019-05-06 LAB — POCT GLYCOSYLATED HEMOGLOBIN (HGB A1C): Hemoglobin A1C: 9.8 % — AB (ref 4.0–5.6)

## 2019-05-06 LAB — T4, FREE: Free T4: 0.71 ng/dL (ref 0.60–1.60)

## 2019-05-06 LAB — TSH: TSH: 2.02 u[IU]/mL (ref 0.35–4.50)

## 2019-05-06 MED ORDER — DEXCOM G6 TRANSMITTER MISC: 1.0000 | Freq: Once | 0 refills | Status: DC

## 2019-05-06 MED ORDER — REPAGLINIDE 1 MG PO TABS: 1.0000 mg | ORAL_TABLET | Freq: Three times a day (TID) | ORAL | 11 refills | Status: DC

## 2019-05-06 MED ORDER — DEXCOM G6 SENSOR MISC: 1.0000 | 2 refills | Status: DC

## 2019-05-06 NOTE — Patient Instructions (Addendum)
Your blood pressure is high today.  Please see your primary care provider soon, to have it rechecked.  I have sent a prescription to your pharmacy, to increase the repaglinide to 1 mg 3 times a day (just before each meal).  Please call if blood sugar is often over 200, so we can add another medication check your blood sugar once a day.  vary the time of day when you check, between before the 3 meals, and at bedtime.  also check if you have symptoms of your blood sugar being too high or too low.  please keep a record of the readings and bring it to your next appointment here (or you can bring the meter itself).  You can write it on any piece of paper.  please call us sooner if your blood sugar goes below 70, or if you have a lot of readings over 200. Please come back for a follow-up appointment in 2 months.

## 2019-05-06 NOTE — Progress Notes (Signed)
Subjective:    Patient ID: [facility], male    DOB: [birth year], 68 y.o.   MRN: [id number]  HPI Pt returns for f/u of diabetes mellitus: DM type: 2 Dx'ed: 8786 Complications: painful PN, CAD, PDR, and renal insuff.  Therapy: repaglinide  DKA: never Severe hypoglycemia: never Pancreatitis: never Pancreatic imaging: never Other: he took insulin 2012-2020; he intermittently takes prednisone for COPD; lean body habitus raises the possibility he is developing type 1; edema limits rx options.   Interval history: he takes repaglinide as rx'ed.  no cbg record, but states cbg's vary from 110-200's.  pt states he feels well in general.   Past Medical History:  Diagnosis Date  . Allergy   . Anemia   . Blood transfusion without reported diagnosis   . Cataract   . CHF (congestive heart failure) ([place])   . CKD (chronic kidney disease) stage 3, GFR 30-59 ml/min ([facility])   . Coronary artery disease   . Diabetic peripheral neuropathy ([place])   . GERD (gastroesophageal reflux disease)   . Hyperlipidemia   . Hypertension   . Hypertensive crisis 10/16/2018  . Myocardial infarction ([place])   . Noncompliance with medication regimen   . Osteoarthritis    "legs, back" (10/16/2018)  . Osteoporosis   . Pneumonia 12-29-2015   "real bad; I died and they had to bring me back" (10/16/2018)  . Seasonal allergies   . Type II diabetes mellitus ([place])     Past Surgical History:  Procedure Laterality Date  . CARDIAC CATHETERIZATION  10/20/2018  . CORONARY BALLOON ANGIOPLASTY N/A 10/20/2018   Procedure: CORONARY BALLOON ANGIOPLASTY;  Surgeon: Nigel Mormon, MD;  Location: [facility];  Service: Cardiovascular;  Laterality: N/A;  . JOINT REPLACEMENT    . LEFT HEART CATH AND CORONARY ANGIOGRAPHY N/A 10/20/2018   Procedure: LEFT HEART CATH AND CORONARY ANGIOGRAPHY;  Surgeon: Nigel Mormon, MD;  Location: [facility];  Service: Cardiovascular;  Laterality: N/A;  . TOTAL KNEE  ARTHROPLASTY Right     Social History   Socioeconomic History  . Marital status: Legally Separated    Spouse name: Not on file  . Number of children: 4  . Years of education: Not on file  . Highest education level: Not on file  Occupational History  . Not on file  Social Needs  . Financial resource strain: Not on file  . Food insecurity    Worry: Not on file    Inability: Not on file  . Transportation needs    Medical: Not on file    Non-medical: Not on file  Tobacco Use  . Smoking status: Former Smoker    Packs/day: 0.33    Years: 20.00    Pack years: 6.60    Types: Cigarettes    Quit date: 1985    Years since quitting: 35.5  . Smokeless tobacco: Never Used  Substance and Sexual Activity  . Alcohol use: Yes    Comment: 10/16/2018 "couple drinks/year"  . Drug use: Never  . Sexual activity: Not Currently  Lifestyle  . Physical activity    Days per week: Not on file    Minutes per session: Not on file  . Stress: Not on file  Relationships  . Social Herbalist on phone: Not on file    Gets together: Not on file    Attends religious service: Not on file    Active member of club or organization: Not on file  Attends meetings of clubs or organizations: Not on file    Relationship status: Not on file  . Intimate partner violence    Fear of current or ex partner: Not on file    Emotionally abused: Not on file    Physically abused: Not on file    Forced sexual activity: Not on file  Other Topics Concern  . Not on file  Social History Narrative  . Not on file    Current Outpatient Medications on File Prior to Visit  Medication Sig Dispense Refill  . acetaminophen (TYLENOL) 500 MG tablet Take 2 tablets (1,000 mg total) by mouth 2 (two) times daily as needed for moderate pain. 30 tablet 0  . amLODipine (NORVASC) 10 MG tablet Take 1 tablet (10 mg total) by mouth daily. 90 tablet 3  . ASPIRIN LOW DOSE 81 MG EC tablet TAKE 1 TABLET BY MOUTH EVERY DAY 90  tablet 3  . Blood Glucose Monitoring Suppl (ACCU-CHEK GUIDE ME) w/Device KIT 1 Device by Does not apply route 4 (four) times daily -  before meals and at bedtime. 1 kit 0  . carvedilol (COREG) 12.5 MG tablet Take 1 tablet (12.5 mg total) by mouth 2 (two) times daily. 60 tablet 2  . cholecalciferol (VITAMIN D) 25 MCG (1000 UT) tablet Take 1,000 Units by mouth daily.     . Cyanocobalamin 2500 MCG CHEW Chew 2,500 mcg by mouth daily.    . feeding supplement, GLUCERNA SHAKE, (GLUCERNA SHAKE) LIQD Take 237 mLs by mouth 3 (three) times daily between meals.  0  . furosemide (LASIX) 20 MG tablet TAKE 2 TABLETS BY MOUTH IN THE MORNING AND ADDITIONAL 1 TABLET IN AFTERNOON,IF PERSISTENT LEG EDEMA 90 tablet 0  . gabapentin (NEURONTIN) 600 MG tablet TAKE 2 TABLETS BY MOUTH 3 TIMES A DAY 180 tablet 3  . linaclotide (LINZESS) 145 MCG CAPS capsule Take 1 capsule (145 mcg total) by mouth daily as needed (constipation). (Patient taking differently: Take 145 mcg by mouth daily as needed (for constipation). ) 30 capsule 0  . methocarbamol (ROBAXIN) 500 MG tablet TAKE 1 TABLET BY MOUTH TWICE DAILY AS NEEDED FOR MUSCLE SPASM 120 tablet 1  . montelukast (SINGULAIR) 10 MG tablet Take 1 tablet (10 mg total) by mouth at bedtime. 90 tablet 3  . omeprazole (PRILOSEC) 20 MG capsule Take 20 mg by mouth every evening.     . prednisoLONE acetate (PRED FORTE) 1 % ophthalmic suspension 1 drop 4 times daily for 7 days in the left eye then stop 10 mL 0  . rosuvastatin (CRESTOR) 20 MG tablet TAKE 1 TABLET (20 MG TOTAL) BY MOUTH DAILY AT 6 PM. 90 tablet 1  . tadalafil (ADCIRCA/CIALIS) 20 MG tablet Take 1 tablet (20 mg total) by mouth daily as needed for erectile dysfunction. 10 tablet 0  . vitamin E (VITAMIN E) 1000 UNIT capsule Take 1,000 Units by mouth daily.      No current facility-administered medications on file prior to visit.     No Known Allergies  Family History  Problem Relation Age of Onset  . Hypertension Mother   .  Diabetes Mother   . Hyperlipidemia Mother   . Hypertension Father   . Hypertension Sister   . Cancer Sister   . Colon cancer Brother   . Esophageal cancer Neg Hx   . Stomach cancer Neg Hx   . Rectal cancer Neg Hx     BP (!) 150/72 (BP Location: Right Arm, Patient Position: Sitting, Cuff  Size: Normal)   Pulse 77   Ht 6' 2"  (1.88 m)   Wt 212 lb 6.4 oz (96.3 kg)   SpO2 93%   BMI 27.27 kg/m   Review of Systems He denies hypoglycemia.  ED persists    Objective:   Physical Exam VITAL SIGNS:  See vs page GENERAL: no distress Pulses: dorsalis pedis intact bilat.   MSK: no deformity of the feet CV: 2+ bilat leg edema.  Skin:  no ulcer on the feet.  normal color and temp on the feet.   Neuro: sensation is intact to touch on the feet, but decreased from normal.   Ext: There is bilateral onychomycosis of the toenails.  Right great toenail has a contusion, but no swell/drainage/erythema.    Lab Results  Component Value Date   HGBA1C 9.8 (A) 05/06/2019   Lab Results  Component Value Date   CREATININE 2.80 (H) 04/30/2019   BUN 35 (H) 04/30/2019   NA 137 04/30/2019   K 4.4 04/30/2019   CL 104 04/30/2019   CO2 27 04/30/2019       Assessment & Plan:  HTN: is noted today Type 2 DM, with PDR: worse. Renal failure: This limits rx options   Patient Instructions  Your blood pressure is high today.  Please see your primary care provider soon, to have it rechecked.  I have sent a prescription to your pharmacy, to increase the repaglinide to 1 mg 3 times a day (just before each meal).  Please call if blood sugar is often over 200, so we can add another medication check your blood sugar once a day.  vary the time of day when you check, between before the 3 meals, and at bedtime.  also check if you have symptoms of your blood sugar being too high or too low.  please keep a record of the readings and bring it to your next appointment here (or you can bring the meter itself).  You can  write it on any piece of paper.  please call us sooner if your blood sugar goes below 70, or if you have a lot of readings over 200. Please come back for a follow-up appointment in 2 months.

## 2019-05-09 LAB — TESTOSTERONE,FREE AND TOTAL
Testosterone, Free: 7.9 pg/mL (ref 6.6–18.1)
Testosterone: 463 ng/dL (ref 264–916)

## 2019-05-12 ENCOUNTER — Encounter (HOSPITAL_COMMUNITY): Payer: Self-pay | Admitting: Hematology

## 2019-05-14 ENCOUNTER — Telehealth: Payer: Self-pay | Admitting: Endocrinology

## 2019-05-14 ENCOUNTER — Other Ambulatory Visit: Payer: Self-pay | Admitting: Cardiology

## 2019-05-14 ENCOUNTER — Other Ambulatory Visit: Payer: Self-pay | Admitting: Family Medicine

## 2019-05-14 NOTE — Telephone Encounter (Signed)
MEDICATION: Free Style Libre (whole new RX for set up)  PHARMACY:  [facility] [address] [facility]   IS PATIENT OUT OF MEDICATION:   IF NOT; HOW MUCH IS LEFT:   LAST APPOINTMENT DATE: @7 /05/2019  NEXT APPOINTMENT DATE:@9 /06/2019  DO WE HAVE YOUR PERMISSION TO LEAVE A DETAILED MESSAGE: yes  OTHER COMMENTS:    **Let patient know to contact pharmacy at the end of the day to make sure medication is ready. **  ** Please notify patient to allow 48-72 hours to process**  **Encourage patient to contact the pharmacy for refills or they can request refills through [facility]**

## 2019-05-14 NOTE — Telephone Encounter (Signed)
Please fill

## 2019-05-14 NOTE — Telephone Encounter (Signed)
Received notification from [facility] stating that they will not be managing Dexcom. Pt will likely need to choose a DME company to manage. Provided William Webb with the information I received. States she will inform the pt and take care of processing necessary paperwork.

## 2019-05-14 NOTE — Telephone Encounter (Signed)
Called [facility] to request CMN. Was placed on hold for greater than 15 minutes. Faxed a cover sheet requesting they fax a CMN for Dr. Loanne Drilling to complete. Confirmation received. Will await CMN.

## 2019-05-14 NOTE — Telephone Encounter (Signed)
Called pt for clarification. A Rx was not sent for Freestyle, but rather Dexcom. States he was informed by [facility] that a CMN needed to be completed. Advised [facility] generally sends this form for completion. Advised I would call [facility] and advised they fax the form for completion. Verbalized acceptance and understanding.

## 2019-05-15 ENCOUNTER — Telehealth: Payer: Self-pay | Admitting: Dietician

## 2019-05-15 ENCOUNTER — Other Ambulatory Visit: Payer: Self-pay

## 2019-05-15 NOTE — Telephone Encounter (Signed)
Called patient earlier today to discuss his needs. He had a [employer] when he lived in [place] but the prescription has been denied [facility] the local [facility] since [facility]. He just got a new insurance card and does not think this info is [facility] the [facility] locally. Insurance:  [employer], Member [phone], Group [id number] [phone].  He also has Medicare- [employer] (data in epic). The [employer] is through his wife's insurance. He would like a Dexcom.  Researched and conferred with Vaughan Basta.   Patient is not on 3 daily injections of insulin and therefore the Dexcom would not be covered. He does check his BG 5 times daily.  Attempted to call regarding this but his voicemail is not set up.   Will have MA run this through his insurance again using new insurance info.  (He has an appointment with Dr. Loanne Drilling 7/21 at 2:45.)   Medicare will not cover the [facility] without insulin but the [facility].  Antonieta Iba, RD, LDN, CDE

## 2019-05-18 ENCOUNTER — Other Ambulatory Visit: Payer: Self-pay

## 2019-05-18 ENCOUNTER — Inpatient Hospital Stay: Payer: BC Managed Care – PPO | Attending: Hematology | Admitting: Hematology

## 2019-05-18 VITALS — BP 109/68 | HR 79 | Temp 97.5°F | Resp 18 | Ht 74.0 in | Wt 210.9 lb

## 2019-05-18 DIAGNOSIS — E1122 Type 2 diabetes mellitus with diabetic chronic kidney disease: Secondary | ICD-10-CM | POA: Diagnosis not present

## 2019-05-18 DIAGNOSIS — Z7982 Long term (current) use of aspirin: Secondary | ICD-10-CM | POA: Diagnosis not present

## 2019-05-18 DIAGNOSIS — K219 Gastro-esophageal reflux disease without esophagitis: Secondary | ICD-10-CM

## 2019-05-18 DIAGNOSIS — E1140 Type 2 diabetes mellitus with diabetic neuropathy, unspecified: Secondary | ICD-10-CM | POA: Insufficient documentation

## 2019-05-18 DIAGNOSIS — E785 Hyperlipidemia, unspecified: Secondary | ICD-10-CM

## 2019-05-18 DIAGNOSIS — I129 Hypertensive chronic kidney disease with stage 1 through stage 4 chronic kidney disease, or unspecified chronic kidney disease: Secondary | ICD-10-CM | POA: Diagnosis not present

## 2019-05-18 DIAGNOSIS — Z8249 Family history of ischemic heart disease and other diseases of the circulatory system: Secondary | ICD-10-CM | POA: Diagnosis not present

## 2019-05-18 DIAGNOSIS — Z87891 Personal history of nicotine dependence: Secondary | ICD-10-CM | POA: Insufficient documentation

## 2019-05-18 DIAGNOSIS — N183 Chronic kidney disease, stage 3 (moderate): Secondary | ICD-10-CM | POA: Diagnosis not present

## 2019-05-18 DIAGNOSIS — D472 Monoclonal gammopathy: Secondary | ICD-10-CM | POA: Insufficient documentation

## 2019-05-18 DIAGNOSIS — Z79891 Long term (current) use of opiate analgesic: Secondary | ICD-10-CM
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
## 2019-05-18 NOTE — Telephone Encounter (Signed)
Per our discussion, I am forwarding this correspondence to you for follow up with the pt.  Thank you, Ildefonso Keaney

## 2019-05-19 ENCOUNTER — Telehealth: Payer: Self-pay | Admitting: Hematology

## 2019-05-19 ENCOUNTER — Encounter: Payer: Self-pay | Admitting: Endocrinology

## 2019-05-19 ENCOUNTER — Telehealth: Payer: Self-pay | Admitting: Nutrition

## 2019-05-19 ENCOUNTER — Ambulatory Visit (INDEPENDENT_AMBULATORY_CARE_PROVIDER_SITE_OTHER): Payer: BC Managed Care – PPO | Admitting: Endocrinology

## 2019-05-19 VITALS — BP 112/60 | HR 80 | Ht 74.0 in | Wt 210.6 lb

## 2019-05-19 DIAGNOSIS — Z794 Long term (current) use of insulin: Secondary | ICD-10-CM | POA: Diagnosis not present

## 2019-05-19 DIAGNOSIS — N183 Chronic kidney disease, stage 3 unspecified: Secondary | ICD-10-CM

## 2019-05-19 DIAGNOSIS — E1122 Type 2 diabetes mellitus with diabetic chronic kidney disease: Secondary | ICD-10-CM

## 2019-05-19 MED ORDER — FREESTYLE LIBRE 14 DAY READER DEVI: 1.0000 | Freq: Once | 0 refills | Status: AC

## 2019-05-19 MED ORDER — FREESTYLE LIBRE 14 DAY SENSOR MISC: 1.0000 | 3 refills | Status: DC

## 2019-05-19 MED ORDER — NOVOLOG FLEXPEN 100 UNIT/ML ~~LOC~~ SOPN: 3.0000 [IU] | PEN_INJECTOR | Freq: Three times a day (TID) | SUBCUTANEOUS | 11 refills | Status: DC

## 2019-05-19 NOTE — Telephone Encounter (Signed)
Tried to call patient but voice mail is not set up and no answer.  His Dexcom is denied because he is not taking insulin before meals and testing 4X/day.  Will try him later

## 2019-05-19 NOTE — Patient Instructions (Addendum)
I have sent a prescription to your pharmacy, to add Novolog.   Please continue the same repaglinide for now, but we'll stop this when your blood sugar improves.  Also, I have sent a prescription to your pharmacy, for the [facility] continuous glucose monitor.  check your blood sugar once a day.  vary the time of day when you check, between before the 3 meals, and at bedtime.  also check if you have symptoms of your blood sugar being too high or too low.  please keep a record of the readings and bring it to your next appointment here (or you can bring the meter itself).  You can write it on any piece of paper.  please call us sooner if your blood sugar goes below 70, or if you have a lot of readings over 200. Please come back for a follow-up appointment in 1 month.

## 2019-05-19 NOTE — Telephone Encounter (Signed)
Scheduled appt per 7/20 los. ° °Printed and mailed appt calendar. °

## 2019-05-19 NOTE — Progress Notes (Signed)
Subjective:    Patient ID: William Webb [facility], male    DOB: 1951/08/28, 68 y.o.   MRN: [id number]  HPI Pt returns for f/u of diabetes mellitus: DM type: 2 Dx'ed: 2924 Complications: painful PN, CAD, PDR, and renal insuff.  Therapy: repaglinide  DKA: never Severe hypoglycemia: never Pancreatitis: never Pancreatic imaging: never Other: he took insulin 2012-2020; he intermittently takes prednisone for COPD; lean body habitus raises the possibility he is developing type 1; edema and renal failure limit rx options.   Interval history: he takes repaglinide as rx'ed.  no cbg record, but states cbg's vary from 138-200's.  pt states he feels well in general.   Past Medical History:  Diagnosis Date  . Allergy   . Anemia   . Blood transfusion without reported diagnosis   . Cataract   . CHF (congestive heart failure) ([place])   . CKD (chronic kidney disease) stage 3, GFR 30-59 ml/min (HCC)   . Coronary artery disease   . Diabetic peripheral neuropathy ([place])   . GERD (gastroesophageal reflux disease)   . Hyperlipidemia   . Hypertension   . Hypertensive crisis 10/16/2018  . Myocardial infarction ([place])   . Noncompliance with medication regimen   . Osteoarthritis    "legs, back" (10/16/2018)  . Osteoporosis   . Pneumonia 2016-01-20   "real bad; I died and they had to bring me back" (10/16/2018)  . Seasonal allergies   . Type II diabetes mellitus ([place])     Past Surgical History:  Procedure Laterality Date  . CARDIAC CATHETERIZATION  10/20/2018  . CORONARY BALLOON ANGIOPLASTY N/A 10/20/2018   Procedure: CORONARY BALLOON ANGIOPLASTY;  Surgeon: Nigel Mormon, MD;  Location: [facility];  Service: Cardiovascular;  Laterality: N/A;  . JOINT REPLACEMENT    . LEFT HEART CATH AND CORONARY ANGIOGRAPHY N/A 10/20/2018   Procedure: LEFT HEART CATH AND CORONARY ANGIOGRAPHY;  Surgeon: Nigel Mormon, MD;  Location: [facility];  Service: Cardiovascular;  Laterality: N/A;  .  TOTAL KNEE ARTHROPLASTY Right     Social History   Socioeconomic History  . Marital status: Legally Separated    Spouse name: Not on file  . Number of children: 4  . Years of education: Not on file  . Highest education level: Not on file  Occupational History  . Not on file  Social Needs  . Financial resource strain: Not on file  . Food insecurity    Worry: Not on file    Inability: Not on file  . Transportation needs    Medical: Not on file    Non-medical: Not on file  Tobacco Use  . Smoking status: Former Smoker    Packs/day: 0.33    Years: 20.00    Pack years: 6.60    Types: Cigarettes    Quit date: 1985    Years since quitting: 35.5  . Smokeless tobacco: Never Used  Substance and Sexual Activity  . Alcohol use: Yes    Comment: 10/16/2018 "couple drinks/year"  . Drug use: Never  . Sexual activity: Not Currently  Lifestyle  . Physical activity    Days per week: Not on file    Minutes per session: Not on file  . Stress: Not on file  Relationships  . Social Herbalist on phone: Not on file    Gets together: Not on file    Attends religious service: Not on file    Active member of club or organization: Not  on file    Attends meetings of clubs or organizations: Not on file    Relationship status: Not on file  . Intimate partner violence    Fear of current or ex partner: Not on file    Emotionally abused: Not on file    Physically abused: Not on file    Forced sexual activity: Not on file  Other Topics Concern  . Not on file  Social History Narrative  . Not on file    Current Outpatient Medications on File Prior to Visit  Medication Sig Dispense Refill  . acetaminophen (TYLENOL) 500 MG tablet Take 2 tablets (1,000 mg total) by mouth 2 (two) times daily as needed for moderate pain. 30 tablet 0  . amLODipine (NORVASC) 10 MG tablet Take 1 tablet (10 mg total) by mouth daily. 90 tablet 3  . ASPIRIN LOW DOSE 81 MG EC tablet TAKE 1 TABLET BY MOUTH EVERY  DAY 90 tablet 3  . Blood Glucose Monitoring Suppl (ACCU-CHEK GUIDE ME) w/Device KIT 1 Device by Does not apply route 4 (four) times daily -  before meals and at bedtime. 1 kit 0  . carvedilol (COREG) 12.5 MG tablet Take 1 tablet (12.5 mg total) by mouth 2 (two) times daily. 60 tablet 2  . cholecalciferol (VITAMIN D) 25 MCG (1000 UT) tablet Take 1,000 Units by mouth daily.     . Cyanocobalamin 2500 MCG CHEW Chew 2,500 mcg by mouth daily.    . feeding supplement, GLUCERNA SHAKE, (GLUCERNA SHAKE) LIQD Take 237 mLs by mouth 3 (three) times daily between meals.  0  . furosemide (LASIX) 20 MG tablet TAKE 2 TABLETS BY MOUTH IN THE MORNING AND ADDITIONAL 1 TABLET IN AFTERNOON,IF PERSISTENT LEG EDEMA 90 tablet 0  . gabapentin (NEURONTIN) 600 MG tablet TAKE 2 TABLETS BY MOUTH 3 TIMES A DAY 180 tablet 3  . linaclotide (LINZESS) 145 MCG CAPS capsule Take 1 capsule (145 mcg total) by mouth daily as needed (constipation). (Patient taking differently: Take 145 mcg by mouth daily as needed (for constipation). ) 30 capsule 0  . methocarbamol (ROBAXIN) 500 MG tablet TAKE 1 TABLET BY MOUTH TWICE DAILY AS NEEDED FOR MUSCLE SPASM 120 tablet 1  . montelukast (SINGULAIR) 10 MG tablet Take 1 tablet (10 mg total) by mouth at bedtime. 90 tablet 3  . omeprazole (PRILOSEC) 20 MG capsule Take 20 mg by mouth every evening.     . prednisoLONE acetate (PRED FORTE) 1 % ophthalmic suspension 1 drop 4 times daily for 7 days in the left eye then stop 10 mL 0  . repaglinide (PRANDIN) 1 MG tablet Take 1 tablet (1 mg total) by mouth 3 (three) times daily before meals. 90 tablet 11  . rosuvastatin (CRESTOR) 20 MG tablet TAKE 1 TABLET (20 MG TOTAL) BY MOUTH DAILY AT 6 PM. 90 tablet 1  . tadalafil (ADCIRCA/CIALIS) 20 MG tablet Take 1 tablet (20 mg total) by mouth daily as needed for erectile dysfunction. 10 tablet 0  . vitamin E (VITAMIN E) 1000 UNIT capsule Take 1,000 Units by mouth daily.      No current facility-administered medications  on file prior to visit.     No Known Allergies  Family History  Problem Relation Age of Onset  . Hypertension Mother   . Diabetes Mother   . Hyperlipidemia Mother   . Hypertension Father   . Hypertension Sister   . Cancer Sister   . Colon cancer Brother   . Esophageal cancer Neg Hx   .  Stomach cancer Neg Hx   . Rectal cancer Neg Hx     BP 112/60 (BP Location: Left Arm, Patient Position: Sitting, Cuff Size: Normal)   Pulse 80   Ht 6' 2"  (1.88 m)   Wt 210 lb 9.6 oz (95.5 kg)   SpO2 94%   BMI 27.04 kg/m    Review of Systems He denies hypoglycemia.      Objective:   Physical Exam VITAL SIGNS:  See vs page GENERAL: no distress Pulses: dorsalis pedis intact bilat.   MSK: no deformity of the feet CV: 1+ bilat leg edema Skin:  no ulcer on the feet.  normal color and temp on the feet.  Neuro: sensation is intact to touch on the feet, but decreased from normal.  Ext: there is bilateral onychomycosis of the toenails.    Lab Results  Component Value Date   HGBA1C 9.8 (A) 05/06/2019   Lab Results  Component Value Date   CREATININE 3.01 (H) 05/06/2019   BUN 33 (H) 05/06/2019   NA 138 05/06/2019   K 4.5 05/06/2019   CL 104 05/06/2019   CO2 26 05/06/2019       Assessment & Plan:  Type 2 DM, with PAD: he needs increased rx Edema: This limits rx options Renal failure: in this setting, he may not need basal insulin.  Patient Instructions  I have sent a prescription to your pharmacy, to add Novolog.   Please continue the same repaglinide for now, but we'll stop this when your blood sugar improves.  Also, I have sent a prescription to your pharmacy, for the [facility] continuous glucose monitor.  check your blood sugar once a day.  vary the time of day when you check, between before the 3 meals, and at bedtime.  also check if you have symptoms of your blood sugar being too high or too low.  please keep a record of the readings and bring it to your next appointment  here (or you can bring the meter itself).  You can write it on any piece of paper.  please call us sooner if your blood sugar goes below 70, or if you have a lot of readings over 200. Please come back for a follow-up appointment in 1 month.

## 2019-05-20 ENCOUNTER — Telehealth: Payer: Self-pay

## 2019-05-20 NOTE — Telephone Encounter (Signed)
Following message sent to Dr. Loanne Drilling and myself from Leonia Reader, RN:      Dr. Loanne Drilling,  Patient's wife says that [facility] told her that if you can write a letter of medical necessity, that he has difficulty getting a drop of blood, he has neuropathy of the hands and it is very difficult--"he sometimes starts to cry, because of the difficulty of getting enough blood and he ends up wasting so many strips". If we send this to the insurance, they may pay for this. He is not on insulin, but is still testing QID    Per Leonia Reader, RN, if Dr. Loanne Drilling is willing to write the letter, she will submit to [facility] per conversation she had with pt's wife.

## 2019-05-20 NOTE — Telephone Encounter (Signed)
Talked with patient on 05/19/19.  Told him that Medicare will not pay for CGM unless he is on insulin.  He says his wife called, and they said it would be covered.  Message sent to Ammie to send script for Dexcom to a internet DME provider

## 2019-05-21 DIAGNOSIS — H268 Other specified cataract: Secondary | ICD-10-CM | POA: Diagnosis not present

## 2019-05-21 HISTORY — PX: CATARACT EXTRACTION: SUR2

## 2019-05-24 ENCOUNTER — Other Ambulatory Visit: Payer: Self-pay | Admitting: Cardiology

## 2019-05-24 DIAGNOSIS — I10 Essential (primary) hypertension: Secondary | ICD-10-CM

## 2019-05-25 NOTE — Progress Notes (Signed)
[facility] Note  05/26/2019     CHIEF COMPLAINT Patient presents for Retina Follow Up   HISTORY OF PRESENT ILLNESS: William Webb is a 68 y.o. male who presents to the clinic today for:   HPI    Retina Follow Up    Patient presents with  Diabetic Retinopathy.  In both eyes.  Severity is moderate.  Duration of 4 weeks.  Since onset it is stable.  I, the attending physician,  performed the HPI with the patient and updated documentation appropriately.          Comments    Patient states vision blurry OD, seems pretty clear OS. Using a blue top and orange top drop from Dr. [name]. Unsure of gtt names and directions. BS was 139 this am. Last a1c was higher than 7, but unsure of exact number. Patient's doctor considering patient for dialysis.       Last edited by [name], MD on 05/26/2019  1:59 PM. (History)    Pt states he had cataract surgery OD with Dr. [name] last week.  Referring physician: Billie Ruddy, MD [place],  [place] [id number]  HISTORICAL INFORMATION:   Selected notes from the MEDICAL RECORD NUMBER Referred by Dr. [name] for DM exam LEE:  Ocular Hx-cataracts OU PMH-DM (A1c: 7.8 [05.07.20], takes repaglinide, tresiba), CHF,CKD, CAD, HLD, HTN, MI   CURRENT MEDICATIONS: Current Outpatient Medications (Ophthalmic Drugs)  Medication Sig  . prednisoLONE acetate (PRED FORTE) 1 % ophthalmic suspension 1 drop 4 times daily for 7 days in the left eye then stop (Patient not taking: Reported on 05/26/2019)   No current facility-administered medications for this visit.  (Ophthalmic Drugs)   Current Outpatient Medications (Other)  Medication Sig  . acetaminophen (TYLENOL) 500 MG tablet Take 2 tablets (1,000 mg total) by mouth 2 (two) times daily as needed for moderate pain.  Marland Kitchen amLODipine (NORVASC) 10 MG tablet Take 1 tablet (10 mg total) by mouth daily.  . ASPIRIN LOW DOSE 81 MG EC tablet TAKE 1 TABLET BY  MOUTH EVERY DAY  . Blood Glucose Monitoring Suppl (ACCU-CHEK GUIDE ME) w/Device KIT 1 Device by Does not apply route 4 (four) times daily -  before meals and at bedtime.  . carvedilol (COREG) 12.5 MG tablet TAKE 1 TABLET (12.5 MG TOTAL) BY MOUTH 2 (TWO) TIMES DAILY.  . cholecalciferol (VITAMIN D) 25 MCG (1000 UT) tablet Take 1,000 Units by mouth daily.   . Continuous Blood Gluc Sensor (FREESTYLE LIBRE 14 DAY SENSOR) MISC 1 Device by Does not apply route every 14 (fourteen) days.  . Cyanocobalamin 2500 MCG CHEW Chew 2,500 mcg by mouth daily.  . feeding supplement, GLUCERNA SHAKE, (GLUCERNA SHAKE) LIQD Take 237 mLs by mouth 3 (three) times daily between meals.  . furosemide (LASIX) 20 MG tablet TAKE 2 TABLETS BY MOUTH IN THE MORNING AND ADDITIONAL 1 TABLET IN AFTERNOON,IF PERSISTENT LEG EDEMA  . gabapentin (NEURONTIN) 600 MG tablet TAKE 2 TABLETS BY MOUTH 3 TIMES A DAY  . insulin aspart (NOVOLOG FLEXPEN) 100 UNIT/ML FlexPen Inject 3 Units into the skin 3 (three) times daily with meals.  Marland Kitchen linaclotide (LINZESS) 145 MCG CAPS capsule Take 1 capsule (145 mcg total) by mouth daily as needed (constipation). (Patient taking differently: Take 145 mcg by mouth daily as needed (for constipation). )  . methocarbamol (ROBAXIN) 500 MG tablet TAKE 1 TABLET BY MOUTH TWICE DAILY AS NEEDED FOR MUSCLE SPASM  . montelukast (  SINGULAIR) 10 MG tablet Take 1 tablet (10 mg total) by mouth at bedtime.  Marland Kitchen omeprazole (PRILOSEC) 20 MG capsule Take 20 mg by mouth every evening.   . repaglinide (PRANDIN) 1 MG tablet Take 1 tablet (1 mg total) by mouth 3 (three) times daily before meals.  . rosuvastatin (CRESTOR) 20 MG tablet TAKE 1 TABLET (20 MG TOTAL) BY MOUTH DAILY AT 6 PM.  . tadalafil (ADCIRCA/CIALIS) 20 MG tablet Take 1 tablet (20 mg total) by mouth daily as needed for erectile dysfunction.  . vitamin E (VITAMIN E) 1000 UNIT capsule Take 1,000 Units by mouth daily.    No current facility-administered medications for this  visit.  (Other)      REVIEW OF SYSTEMS: ROS    Positive for: Endocrine, Cardiovascular, Eyes   Negative for: Constitutional, Gastrointestinal, Neurological, Skin, Genitourinary, Musculoskeletal, HENT, Respiratory, Psychiatric, Allergic/Imm, Heme/Lymph   Last edited by Roselee Nova D on 05/26/2019  1:27 PM. (History)       ALLERGIES No Known Allergies  PAST MEDICAL HISTORY Past Medical History:  Diagnosis Date  . Allergy   . Anemia   . Blood transfusion without reported diagnosis   . Cataract   . CHF (congestive heart failure) ([place])   . CKD (chronic kidney disease) stage 3, GFR 30-59 ml/min (HCC)   . Coronary artery disease   . Diabetic peripheral neuropathy ([place])   . GERD (gastroesophageal reflux disease)   . Hyperlipidemia   . Hypertension   . Hypertensive crisis 10/16/2018  . Myocardial infarction ([place])   . Noncompliance with medication regimen   . Osteoarthritis    "legs, back" (10/16/2018)  . Osteoporosis   . Pneumonia 01-24-16   "real bad; I died and they had to bring me back" (10/16/2018)  . Seasonal allergies   . Type II diabetes mellitus ([place])    Past Surgical History:  Procedure Laterality Date  . CARDIAC CATHETERIZATION  10/20/2018  . CORONARY BALLOON ANGIOPLASTY N/A 10/20/2018   Procedure: CORONARY BALLOON ANGIOPLASTY;  Surgeon: Nigel Mormon, MD;  Location: [facility];  Service: Cardiovascular;  Laterality: N/A;  . JOINT REPLACEMENT    . LEFT HEART CATH AND CORONARY ANGIOGRAPHY N/A 10/20/2018   Procedure: LEFT HEART CATH AND CORONARY ANGIOGRAPHY;  Surgeon: Nigel Mormon, MD;  Location: [facility];  Service: Cardiovascular;  Laterality: N/A;  . TOTAL KNEE ARTHROPLASTY Right     FAMILY HISTORY Family History  Problem Relation Age of Onset  . Hypertension Mother   . Diabetes Mother   . Hyperlipidemia Mother   . Hypertension Father   . Hypertension Sister   . Cancer Sister   . Colon cancer Brother   . Esophageal cancer  Neg Hx   . Stomach cancer Neg Hx   . Rectal cancer Neg Hx     SOCIAL HISTORY Social History   Tobacco Use  . Smoking status: Former Smoker    Packs/day: 0.33    Years: 20.00    Pack years: 6.60    Types: Cigarettes    Quit date: 1985    Years since quitting: 35.5  . Smokeless tobacco: Never Used  Substance Use Topics  . Alcohol use: Yes    Comment: 10/16/2018 "couple drinks/year"  . Drug use: Never         OPHTHALMIC EXAM:  Base Eye Exam    Visual Acuity (Snellen - Linear)      Right Left   Dist [place] 20/40 -2 20/50   Dist ph  [place] 20/30 -2 20/30 -2   Correction: Glasses       Tonometry (Tonopen, 1:33 PM)      Right Left   Pressure 14 14       Pupils      Dark Light Shape React APD   Right 3 2 Round Brisk None   Left 3 2 Round Brisk None       Visual Fields      Left Right    Full Full       Extraocular Movement      Right Left    Full, Ortho Full, Ortho       Neuro/Psych    Oriented x3: Yes   Mood/Affect: Normal       Dilation    Both eyes: 1.0% Mydriacyl, 2.5% Phenylephrine @ 1:33 PM        Slit Lamp and Fundus Exam    Slit Lamp Exam      Right Left   Lids/Lashes Dermatochalasis - upper lid, Meibomian gland dysfunction Dermatochalasis - upper lid, Meibomian gland dysfunction   Conjunctiva/Sclera Mild Melanosis, mild Conjunctivochalasis, 0.7 mm lesion temporal conj Mild Melanosis, inferior Conjunctivochalasis   Cornea 1+ Punctate epithelial erosions, 3+ pigmented guttata, 1+ central haze and edema, 1-2+ Descemet's folds, well healed temporal cataract wound with tr edema surrounding 3-4+pigmented gutta1+ Punctate epithelial erosions, 1+ endo pigment   Anterior Chamber moderate depth, narrow angles moderate depth, narrow angles   Iris moderatetly dilated to 4.5 mm, No NVI Round and dilated, No NVI   Lens PC IOL well centered 2-3+ Nuclear sclerosis, 3+ Cortical cataract   Vitreous Mild Vitreous syneresis Mild Vitreous syneresis       Fundus Exam       Right Left   Disc Pink and Sharp, +cupping, temporal PPA Sharp rim, mild pallor, +cupping, mild temporal PPA   C/D Ratio 0.7 0.6   Macula Flat, Blunted foveal reflex, mild MA/exudate and cystic changes temporal macula Flat, Blunted foveal reflex, mild RPE mottling, mild MA/exudate temporal macula   Vessels Vascular attenuation, mild Tortuousity Vascular attenuation, mild Tortuousity, Copper wiring   Periphery Attached, scattered IRH/exudate, scattered RPE changes   Attached, scattered IRH/exudate, scattered RPE changes, 360 PRP light pigment changes          Refraction    Wearing Rx      Sphere   Right OTC readers   Left OTC readers          IMAGING AND PROCEDURES  Imaging and Procedures for '@TODAY'$ @  OCT, Retina - OU - Both Eyes       Right Eye Quality was good. Central Foveal Thickness: 217. Progression has been stable. Findings include normal foveal contour, no SRF, intraretinal fluid, intraretinal hyper-reflective material (Cluster of IRF/IRHM temporal macula--stable to slightly improved).   Left Eye Quality was good. Central Foveal Thickness: 252. Progression has been stable. Findings include abnormal foveal contour, vitreous traction, intraretinal fluid, intraretinal hyper-reflective material, no SRF (Clusters of IRF and IRHM temporal macula, non-central, mild VMT--stable from prior).   Notes *Images captured and stored on drive  Diagnosis / Impression:  Non-central DME OU Clusters of IRF/IRHM temporal macula OU--stable from prior   Clinical management:  See below  Abbreviations: NFP - Normal foveal profile. CME - cystoid macular edema. PED - pigment epithelial detachment. IRF - intraretinal fluid. SRF - subretinal fluid. EZ - ellipsoid zone. ERM - epiretinal membrane. ORA - outer retinal atrophy. ORT - outer retinal tubulation. SRHM - subretinal hyper-reflective material  ASSESSMENT/PLAN:    ICD-10-CM   1. 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 Retinal edema  H35.81 OCT, Retina - OU - Both Eyes  4. Vitreomacular traction, left  H43.822   5. Essential hypertension  I10   6. Hypertensive retinopathy of both eyes  H35.033   7. Pseudophakia  Z96.1   8. Combined forms of age-related cataract of left eye  H25.812   9. Glaucoma suspect of both eyes  H40.003     1-3. PDR w/ macular edema OS        Severe nonproliferative diabetic retinopathy w/ DME OD - The incidence, risk factors for progression, natural history and treatment options for diabetic retinopathy  were discussed with patient.   - The need for close monitoring of blood glucose, blood pressure, and serum lipids, avoiding cigarette or any type of tobacco, and the need for long term follow up was also discussed with patient. - exam shows scattered IRH/DBH OU - FA shows capillary nonperfusion OU; late leaking MA OU; +NVE inf nasal quads OS only (OD no NV) - OCT shows Cluster of IRF/IRHM temporal macula OU--stable to slightly improved OD, stable from prior OS - recommend holding off on DME treatment for now - s/p PRP OS (06.30.20) -- good peripheral laser in place - no PRP OD today due to corneal edema - patient is s/p CE w/ IOL OD--POV w/ Dr. [name] on 08.04.20 - f/u 4 weeks -- DFE/OCT possible focal laser vs PRP OD  4. VMT OS - mild traction centrally with minimal distortion of fovea - no IRF/SRF  5,6. Hypertensive retinopathy OU  - discussed importance of tight BP control  - monitor  7. Pseudophakia OD  - s/p CE/IOL OD 7.23.20 w/ expert surgeon, Dr. Midge Aver  - IOL in excellent position, mild corneal edema OD  - post op drops per Dr. [name]  - schedule for POV on 8.4.2020  8. Mixed form age related cataract OS  - The symptoms  of cataract, surgical options, and treatments and risks were discussed with patient.  - discussed diagnosis and progression  - pt reports significant glare symptoms  - under the expert management of Dr. Midge Aver  9. Glaucoma Suspect  - IOP 14 OU today  - significant cupping and pallor of disc OU  - under the expert management of Dr. Midge Aver   Ophthalmic Meds Ordered this visit:  No orders of the defined types were placed in this encounter.      Return 4 weeks, for PRP OD.  There are no Patient Instructions on file for this visit.   Explained the diagnoses, plan, and follow up with the patient and they expressed understanding.  Patient expressed understanding of the importance of proper follow up care.   This document serves as a record of services personally performed by Gardiner Sleeper, MD, PhD. It was created on their behalf by Ernest Mallick, OA, an ophthalmic assistant. The creation of this record is the provider's dictation and/or activities during the visit.    Electronically signed by: Ernest Mallick, OA  07.27.2020 2:54 PM     Gardiner Sleeper, M.D., Ph.D. Diseases & Surgery of the Retina and Vitreous [facility]  I have reviewed the above documentation for  accuracy and completeness, and I agree with the above. Gardiner Sleeper, M.D., Ph.D. 05/26/19 2:54 PM    Abbreviations: M myopia (nearsighted); A astigmatism; H hyperopia (farsighted); P presbyopia; Mrx spectacle prescription;  CTL contact lenses; OD right eye; OS left eye; OU both eyes  XT exotropia; ET esotropia; PEK punctate epithelial keratitis; PEE punctate epithelial erosions; DES dry eye syndrome; MGD meibomian gland dysfunction; ATs artificial tears; PFAT's preservative free artificial tears; [place] nuclear sclerotic cataract; PSC posterior subcapsular cataract; ERM epi-retinal membrane; PVD posterior vitreous detachment; RD retinal detachment; DM diabetes mellitus; DR diabetic  retinopathy; NPDR non-proliferative diabetic retinopathy; PDR proliferative diabetic retinopathy; CSME clinically significant macular edema; DME diabetic macular edema; dbh dot blot hemorrhages; CWS cotton wool spot; POAG primary open angle glaucoma; C/D cup-to-disc ratio; HVF humphrey visual field; GVF goldmann visual field; OCT optical coherence tomography; IOP intraocular pressure; BRVO Branch retinal vein occlusion; CRVO central retinal vein occlusion; CRAO central retinal artery occlusion; BRAO branch retinal artery occlusion; RT retinal tear; SB scleral buckle; PPV pars plana vitrectomy; VH Vitreous hemorrhage; PRP panretinal laser photocoagulation; IVK intravitreal kenalog; VMT vitreomacular traction; MH Macular hole;  NVD neovascularization of the disc; NVE neovascularization elsewhere; AREDS age related eye disease study; ARMD age related macular degeneration; POAG primary open angle glaucoma; EBMD epithelial/anterior basement membrane dystrophy; ACIOL anterior chamber intraocular lens; IOL intraocular lens; PCIOL posterior chamber intraocular lens; Phaco/IOL phacoemulsification with intraocular lens placement; [place] photorefractive keratectomy; LASIK laser assisted in situ keratomileusis; HTN hypertension; DM diabetes mellitus; COPD chronic obstructive pulmonary disease

## 2019-05-26 ENCOUNTER — Other Ambulatory Visit: Payer: Self-pay

## 2019-05-26 ENCOUNTER — Encounter (INDEPENDENT_AMBULATORY_CARE_PROVIDER_SITE_OTHER): Payer: Self-pay | Admitting: Ophthalmology

## 2019-05-26 ENCOUNTER — Ambulatory Visit (INDEPENDENT_AMBULATORY_CARE_PROVIDER_SITE_OTHER): Payer: BC Managed Care – PPO | Admitting: Ophthalmology

## 2019-05-26 DIAGNOSIS — H40003 Preglaucoma, unspecified, bilateral: Secondary | ICD-10-CM

## 2019-05-26 DIAGNOSIS — E113411 Type 2 diabetes mellitus with severe nonproliferative diabetic retinopathy with macular edema, right eye: Secondary | ICD-10-CM

## 2019-05-26 DIAGNOSIS — E113512 Type 2 diabetes mellitus with proliferative diabetic retinopathy with macular edema, left eye: Secondary | ICD-10-CM | POA: Diagnosis not present

## 2019-05-26 DIAGNOSIS — H3581 Retinal edema: Secondary | ICD-10-CM | POA: Diagnosis not present

## 2019-05-26 DIAGNOSIS — H43822 Vitreomacular adhesion, left eye: Secondary | ICD-10-CM

## 2019-05-26 DIAGNOSIS — Z961 Presence of intraocular lens: Secondary | ICD-10-CM

## 2019-05-26 DIAGNOSIS — I10 Essential (primary) hypertension: Secondary | ICD-10-CM

## 2019-05-26 DIAGNOSIS — H25812 Combined forms of age-related cataract, left eye: Secondary | ICD-10-CM

## 2019-05-26 DIAGNOSIS — H35033 Hypertensive retinopathy, bilateral: Secondary | ICD-10-CM

## 2019-05-27 ENCOUNTER — Encounter (INDEPENDENT_AMBULATORY_CARE_PROVIDER_SITE_OTHER): Payer: Medicare HMO | Admitting: Ophthalmology

## 2019-06-02 ENCOUNTER — Telehealth: Payer: Self-pay | Admitting: Endocrinology

## 2019-06-02 ENCOUNTER — Other Ambulatory Visit: Payer: Self-pay

## 2019-06-02 DIAGNOSIS — N183 Chronic kidney disease, stage 3 unspecified: Secondary | ICD-10-CM

## 2019-06-02 NOTE — Telephone Encounter (Signed)
Please contact pt as we informed him that this would happen since he did not meet criteria

## 2019-06-02 NOTE — Telephone Encounter (Signed)
Patient's wife and daughter request a new RX for a Freestyle Libre 14 day Monitor/Device system that goes with sensors  sent to: [id number] [address], [place] [phone] (Phone) [phone] (Fax)

## 2019-06-02 NOTE — Telephone Encounter (Signed)
Daughter called back to advise that [facility] and the insurance states that the [employer] needs a PA for medical necessity. Number for PA is [phone]

## 2019-06-03 ENCOUNTER — Telehealth (HOSPITAL_COMMUNITY): Payer: Self-pay | Admitting: Rehabilitation

## 2019-06-03 NOTE — Telephone Encounter (Signed)

## 2019-06-04 ENCOUNTER — Other Ambulatory Visit: Payer: Self-pay

## 2019-06-04 ENCOUNTER — Encounter: Payer: Self-pay | Admitting: *Deleted

## 2019-06-04 ENCOUNTER — Ambulatory Visit (HOSPITAL_COMMUNITY)
Admission: RE | Admit: 2019-06-04 | Discharge: 2019-06-04 | Disposition: A | Discharge status: Home / Self Care | Payer: BC Managed Care – PPO | Source: Ambulatory Visit | Attending: Family | Admitting: Family

## 2019-06-04 ENCOUNTER — Ambulatory Visit (INDEPENDENT_AMBULATORY_CARE_PROVIDER_SITE_OTHER)
Admission: RE | Admit: 2019-06-04 | Discharge: 2019-06-04 | Disposition: A | Discharge status: Home / Self Care | Payer: BC Managed Care – PPO | Source: Ambulatory Visit | Attending: Family | Admitting: Family

## 2019-06-04 ENCOUNTER — Ambulatory Visit (INDEPENDENT_AMBULATORY_CARE_PROVIDER_SITE_OTHER): Payer: BC Managed Care – PPO | Admitting: Vascular Surgery

## 2019-06-04 ENCOUNTER — Other Ambulatory Visit: Payer: Self-pay | Admitting: *Deleted

## 2019-06-04 ENCOUNTER — Encounter: Payer: Self-pay | Admitting: Vascular Surgery

## 2019-06-04 VITALS — BP 167/79 | HR 69 | Temp 97.6°F | Resp 20 | Ht 74.0 in | Wt 215.0 lb

## 2019-06-04 DIAGNOSIS — N183 Chronic kidney disease, stage 3 unspecified: Secondary | ICD-10-CM

## 2019-06-04 DIAGNOSIS — N185 Chronic kidney disease, stage 5: Secondary | ICD-10-CM

## 2019-06-04 NOTE — H&P (View-Only) (Signed)
Referring Physician: Dr William Webb  Patient name: William Webb [facility] MRN: [id number] DOB: [birth year] Sex: male  REASON FOR CONSULT: Hemodialysis access  HPI: William Webb is a 68 y.o. male, referred by Dr. Royce Webb for hemodialysis access.  The patient has had no prior access procedures.  He is not currently on hemodialysis.  He is CKD5.  He is right-handed.  Other medical problems include congestive heart failure, coronary artery disease, peripheral neuropathy, hypertension, hyperlipidemia all of which are currently stable.  Past Medical History:  Diagnosis Date  . Allergy   . Anemia   . Blood transfusion without reported diagnosis   . Cataract   . CHF (congestive heart failure) ([place])   . CKD (chronic kidney disease) stage 3, GFR 30-59 ml/min (HCC)   . Coronary artery disease   . Diabetic peripheral neuropathy (William Webb)   . GERD (gastroesophageal reflux disease)   . Hyperlipidemia   . Hypertension   . Hypertensive crisis 10/16/2018  . Myocardial infarction ([place])   . Noncompliance with medication regimen   . Osteoarthritis    "legs, back" (10/16/2018)  . Osteoporosis   . Pneumonia 12/16/2015   "real bad; I died and they had to bring me back" (10/16/2018)  . Seasonal allergies   . Type II diabetes mellitus ([place])    Past Surgical History:  Procedure Laterality Date  . CARDIAC CATHETERIZATION  10/20/2018  . CORONARY BALLOON ANGIOPLASTY N/A 10/20/2018   Procedure: CORONARY BALLOON ANGIOPLASTY;  Surgeon: William Mormon, MD;  Location: [facility];  Service: Cardiovascular;  Laterality: N/A;  . EYE SURGERY    . JOINT REPLACEMENT    . LEFT HEART CATH AND CORONARY ANGIOGRAPHY N/A 10/20/2018   Procedure: LEFT HEART CATH AND CORONARY ANGIOGRAPHY;  Surgeon: William Mormon, MD;  Location: [facility];  Service: Cardiovascular;  Laterality: N/A;  . TOTAL KNEE ARTHROPLASTY Right     Family History  Problem Relation Age of Onset  . Hypertension Mother    . Diabetes Mother   . Hyperlipidemia Mother   . Hypertension Father   . Hypertension Sister   . Cancer Sister   . Colon cancer Brother   . Esophageal cancer Neg Hx   . Stomach cancer Neg Hx   . Rectal cancer Neg Hx     SOCIAL HISTORY: Social History   Socioeconomic History  . Marital status: Legally Separated    Spouse name: Not on file  . Number of children: 4  . Years of education: Not on file  . Highest education level: Not on file  Occupational History  . Not on file  Social Needs  . Financial resource strain: Not on file  . Food insecurity    Worry: Not on file    Inability: Not on file  . Transportation needs    Medical: Not on file    Non-medical: Not on file  Tobacco Use  . Smoking status: Former Smoker    Packs/day: 0.33    Years: 20.00    Pack years: 6.60    Types: Cigarettes    Quit date: 1985    Years since quitting: 35.6  . Smokeless tobacco: Never Used  Substance and Sexual Activity  . Alcohol use: Yes    Comment: 10/16/2018 "couple drinks/year"  . Drug use: Never  . Sexual activity: Not Currently  Lifestyle  . Physical activity    Days per week: Not on file    Minutes per session: Not on file  .  Stress: Not on file  Relationships  . Social Herbalist on phone: Not on file    Gets together: Not on file    Attends religious service: Not on file    Active member of club or organization: Not on file    Attends meetings of clubs or organizations: Not on file    Relationship status: Not on file  . Intimate partner violence    Fear of current or ex partner: Not on file    Emotionally abused: Not on file    Physically abused: Not on file    Forced sexual activity: Not on file  Other Topics Concern  . Not on file  Social History Narrative  . Not on file    No Known Allergies  Current Outpatient Medications  Medication Sig Dispense Refill  . acetaminophen (TYLENOL) 500 MG tablet Take 2 tablets (1,000 mg total) by mouth 2 (two)  times daily as needed for moderate pain. 30 tablet 0  . amLODipine (NORVASC) 10 MG tablet Take 1 tablet (10 mg total) by mouth daily. 90 tablet 3  . ASPIRIN LOW DOSE 81 MG EC tablet TAKE 1 TABLET BY MOUTH EVERY DAY (Patient taking differently: Take 81 mg by mouth daily. ) 90 tablet 3  . Blood Glucose Monitoring Suppl (ACCU-CHEK GUIDE ME) w/Device KIT 1 Device by Does not apply route 4 (four) times daily -  before meals and at bedtime. 1 kit 0  . carvedilol (COREG) 12.5 MG tablet TAKE 1 TABLET (12.5 MG TOTAL) BY MOUTH 2 (TWO) TIMES DAILY. 180 tablet 1  . cholecalciferol (VITAMIN D) 25 MCG (1000 UT) tablet Take 1,000 Units by mouth daily.     . Continuous Blood Gluc Sensor (FREESTYLE LIBRE 14 DAY SENSOR) MISC 1 Device by Does not apply route every 14 (fourteen) days. 6 each 3  . Cyanocobalamin 2500 MCG CHEW Chew 2,500 mcg by mouth daily.    . feeding supplement, GLUCERNA SHAKE, (GLUCERNA SHAKE) LIQD Take 237 mLs by mouth 3 (three) times daily between meals.  0  . furosemide (LASIX) 20 MG tablet TAKE 2 TABLETS BY MOUTH IN THE MORNING AND ADDITIONAL 1 TABLET IN AFTERNOON,IF PERSISTENT LEG EDEMA (Patient taking differently: Take 20-40 mg by mouth See admin instructions. Take 40 mg by mouth in the morning, may take an additional 20 mg in the afternoon for persistent edema) 90 tablet 0  . gabapentin (NEURONTIN) 600 MG tablet TAKE 2 TABLETS BY MOUTH 3 TIMES A DAY (Patient taking differently: Take 600 mg by mouth 3 (three) times daily. ) 180 tablet 3  . insulin aspart (NOVOLOG FLEXPEN) 100 UNIT/ML FlexPen Inject 3 Units into the skin 3 (three) times daily with meals. 15 mL 11  . linaclotide (LINZESS) 145 MCG CAPS capsule Take 1 capsule (145 mcg total) by mouth daily as needed (constipation). (Patient taking differently: Take 145 mcg by mouth daily before breakfast. ) 30 capsule 0  . methocarbamol (ROBAXIN) 500 MG tablet TAKE 1 TABLET BY MOUTH TWICE DAILY AS NEEDED FOR MUSCLE SPASM (Patient taking differently:  Take 500 mg by mouth 2 (two) times daily as needed for muscle spasms. ) 120 tablet 1  . montelukast (SINGULAIR) 10 MG tablet Take 1 tablet (10 mg total) by mouth at bedtime. 90 tablet 3  . omeprazole (PRILOSEC) 20 MG capsule Take 20 mg by mouth every evening.     . prednisoLONE acetate (PRED FORTE) 1 % ophthalmic suspension 1 drop 4 times daily for 7 days in  the left eye then stop (Patient not taking: Reported on 06/04/2019) 10 mL 0  . repaglinide (PRANDIN) 1 MG tablet Take 1 tablet (1 mg total) by mouth 3 (three) times daily before meals. 90 tablet 11  . rosuvastatin (CRESTOR) 20 MG tablet TAKE 1 TABLET (20 MG TOTAL) BY MOUTH DAILY AT 6 PM. (Patient taking differently: Take 20 mg by mouth every evening. ) 90 tablet 1  . tadalafil (ADCIRCA/CIALIS) 20 MG tablet Take 1 tablet (20 mg total) by mouth daily as needed for erectile dysfunction. 10 tablet 0  . vitamin E (VITAMIN E) 1000 UNIT capsule Take 1,000 Units by mouth daily.     . Vitamin D, Ergocalciferol, (DRISDOL) 1.25 MG (50000 UT) CAPS capsule Take 50,000 Units by mouth every Tuesday.     No current facility-administered medications for this visit.     ROS:   General:  No weight loss, Fever, chills  HEENT: No recent headaches, no nasal bleeding, no visual changes, no sore throat  Neurologic: No dizziness, blackouts, seizures. No recent symptoms of stroke or mini- stroke. No recent episodes of slurred speech, or temporary blindness.  Cardiac: No recent episodes of chest pain/pressure, no shortness of breath at rest.  No shortness of breath with exertion.  Denies history of atrial fibrillation or irregular heartbeat  Vascular: No history of rest pain in feet.  No history of claudication.  No history of non-healing ulcer, No history of DVT   Pulmonary: No home oxygen, no productive cough, no hemoptysis,  No asthma or wheezing  Musculoskeletal:  _0  Arthritis, _1  Low back pain,  _2  Joint pain  Hematologic:No history of hypercoagulable  state.  No history of easy bleeding.  No history of anemia  Gastrointestinal: No hematochezia or melena,  No gastroesophageal reflux, no trouble swallowing  Urinary: _3  chronic Kidney disease, _4  on HD - _5  MWF or _6  TTHS, _7  Burning with urination, _8  Frequent urination, _9  Difficulty urinating;   Skin: No rashes  Psychological: No history of anxiety,  No history of depression   Physical Examination  Vitals:   06/04/19 1106  BP: (!) 167/79  Pulse: 69  Resp: 20  Temp: 97.6 F (36.4 C)  SpO2: 99%  Weight: 215 lb (97.5 kg)  Height: _10  (1.88 m)    Body mass index is 27.6 kg/m.  General:  Alert and oriented, no acute distress HEENT: Normal Neck: No JVD Pulmonary: Clear to auscultation bilaterally Cardiac: Regular Rate and Rhythm  Skin: No rash Extremity Pulses:  2+ radial, brachial pulses bilaterally Musculoskeletal: No deformity or edema  Neurologic: Upper and lower extremity motor 5/5 and symmetric  DATA:  Patient had a vein mapping ultrasound today which showed cephalic vein is less than 2 mm bilaterally.  Basilic vein was 3 to 4 mm bilaterally.  Normal upper extremity arterial duplex exam  ASSESSMENT: Patient needs long-term hemodialysis access.  Best vein currently would be basilic vein.   PLAN: Left basilic vein transposition fistula scheduled for Monday, June 08, 2019.  Risk benefits possible complications of procedure details including but not limited to bleeding infection ischemic steal non-maturation of the fistula.  He understands and agrees to proceed.   Ruta Hinds, MD [facility] Office: [phone] Pager: [phone]

## 2019-06-04 NOTE — Progress Notes (Signed)
Referring Physician: Dr Harrie Jeans  Patient name: William Webb [facility] MRN: [id number] DOB: [birth year] Sex: male  REASON FOR CONSULT: Hemodialysis access  HPI: William Webb is a 68 y.o. male, referred by [name] for hemodialysis access.  The patient has had no prior access procedures.  He is not currently on hemodialysis.  He is CKD5.  He is right-handed.  Other medical problems include congestive heart failure, coronary artery disease, peripheral neuropathy, hypertension, hyperlipidemia all of which are currently stable.  Past Medical History:  Diagnosis Date  . Allergy   . Anemia   . Blood transfusion without reported diagnosis   . Cataract   . CHF (congestive heart failure) ([place])   . CKD (chronic kidney disease) stage 3, GFR 30-59 ml/min (HCC)   . Coronary artery disease   . Diabetic peripheral neuropathy ([place])   . GERD (gastroesophageal reflux disease)   . Hyperlipidemia   . Hypertension   . Hypertensive crisis 10/16/2018  . Myocardial infarction ([place])   . Noncompliance with medication regimen   . Osteoarthritis    "legs, back" (10/16/2018)  . Osteoporosis   . Pneumonia 12/16/2015   "real bad; I died and they had to bring me back" (10/16/2018)  . Seasonal allergies   . Type II diabetes mellitus ([place])    Past Surgical History:  Procedure Laterality Date  . CARDIAC CATHETERIZATION  10/20/2018  . CORONARY BALLOON ANGIOPLASTY N/A 10/20/2018   Procedure: CORONARY BALLOON ANGIOPLASTY;  Surgeon: Nigel Mormon, MD;  Location: [facility];  Service: Cardiovascular;  Laterality: N/A;  . EYE SURGERY    . JOINT REPLACEMENT    . LEFT HEART CATH AND CORONARY ANGIOGRAPHY N/A 10/20/2018   Procedure: LEFT HEART CATH AND CORONARY ANGIOGRAPHY;  Surgeon: Nigel Mormon, MD;  Location: [facility];  Service: Cardiovascular;  Laterality: N/A;  . TOTAL KNEE ARTHROPLASTY Right     Family History  Problem Relation Age of Onset  . Hypertension Mother    . Diabetes Mother   . Hyperlipidemia Mother   . Hypertension Father   . Hypertension Sister   . Cancer Sister   . Colon cancer Brother   . Esophageal cancer Neg Hx   . Stomach cancer Neg Hx   . Rectal cancer Neg Hx     SOCIAL HISTORY: Social History   Socioeconomic History  . Marital status: Legally Separated    Spouse name: Not on file  . Number of children: 4  . Years of education: Not on file  . Highest education level: Not on file  Occupational History  . Not on file  Social Needs  . Financial resource strain: Not on file  . Food insecurity    Worry: Not on file    Inability: Not on file  . Transportation needs    Medical: Not on file    Non-medical: Not on file  Tobacco Use  . Smoking status: Former Smoker    Packs/day: 0.33    Years: 20.00    Pack years: 6.60    Types: Cigarettes    Quit date: 1985    Years since quitting: 35.6  . Smokeless tobacco: Never Used  Substance and Sexual Activity  . Alcohol use: Yes    Comment: 10/16/2018 "couple drinks/year"  . Drug use: Never  . Sexual activity: Not Currently  Lifestyle  . Physical activity    Days per week: Not on file    Minutes per session: Not on file  .  Stress: Not on file  Relationships  . Social Herbalist on phone: Not on file    Gets together: Not on file    Attends religious service: Not on file    Active member of club or organization: Not on file    Attends meetings of clubs or organizations: Not on file    Relationship status: Not on file  . Intimate partner violence    Fear of current or ex partner: Not on file    Emotionally abused: Not on file    Physically abused: Not on file    Forced sexual activity: Not on file  Other Topics Concern  . Not on file  Social History Narrative  . Not on file    No Known Allergies  Current Outpatient Medications  Medication Sig Dispense Refill  . acetaminophen (TYLENOL) 500 MG tablet Take 2 tablets (1,000 mg total) by mouth 2 (two)  times daily as needed for moderate pain. 30 tablet 0  . amLODipine (NORVASC) 10 MG tablet Take 1 tablet (10 mg total) by mouth daily. 90 tablet 3  . ASPIRIN LOW DOSE 81 MG EC tablet TAKE 1 TABLET BY MOUTH EVERY DAY (Patient taking differently: Take 81 mg by mouth daily. ) 90 tablet 3  . Blood Glucose Monitoring Suppl (ACCU-CHEK GUIDE ME) w/Device KIT 1 Device by Does not apply route 4 (four) times daily -  before meals and at bedtime. 1 kit 0  . carvedilol (COREG) 12.5 MG tablet TAKE 1 TABLET (12.5 MG TOTAL) BY MOUTH 2 (TWO) TIMES DAILY. 180 tablet 1  . cholecalciferol (VITAMIN D) 25 MCG (1000 UT) tablet Take 1,000 Units by mouth daily.     . Continuous Blood Gluc Sensor (FREESTYLE LIBRE 14 DAY SENSOR) MISC 1 Device by Does not apply route every 14 (fourteen) days. 6 each 3  . Cyanocobalamin 2500 MCG CHEW Chew 2,500 mcg by mouth daily.    . feeding supplement, GLUCERNA SHAKE, (GLUCERNA SHAKE) LIQD Take 237 mLs by mouth 3 (three) times daily between meals.  0  . furosemide (LASIX) 20 MG tablet TAKE 2 TABLETS BY MOUTH IN THE MORNING AND ADDITIONAL 1 TABLET IN AFTERNOON,IF PERSISTENT LEG EDEMA (Patient taking differently: Take 20-40 mg by mouth See admin instructions. Take 40 mg by mouth in the morning, may take an additional 20 mg in the afternoon for persistent edema) 90 tablet 0  . gabapentin (NEURONTIN) 600 MG tablet TAKE 2 TABLETS BY MOUTH 3 TIMES A DAY (Patient taking differently: Take 600 mg by mouth 3 (three) times daily. ) 180 tablet 3  . insulin aspart (NOVOLOG FLEXPEN) 100 UNIT/ML FlexPen Inject 3 Units into the skin 3 (three) times daily with meals. 15 mL 11  . linaclotide (LINZESS) 145 MCG CAPS capsule Take 1 capsule (145 mcg total) by mouth daily as needed (constipation). (Patient taking differently: Take 145 mcg by mouth daily before breakfast. ) 30 capsule 0  . methocarbamol (ROBAXIN) 500 MG tablet TAKE 1 TABLET BY MOUTH TWICE DAILY AS NEEDED FOR MUSCLE SPASM (Patient taking differently:  Take 500 mg by mouth 2 (two) times daily as needed for muscle spasms. ) 120 tablet 1  . montelukast (SINGULAIR) 10 MG tablet Take 1 tablet (10 mg total) by mouth at bedtime. 90 tablet 3  . omeprazole (PRILOSEC) 20 MG capsule Take 20 mg by mouth every evening.     . prednisoLONE acetate (PRED FORTE) 1 % ophthalmic suspension 1 drop 4 times daily for 7 days in  the left eye then stop (Patient not taking: Reported on 06/04/2019) 10 mL 0  . repaglinide (PRANDIN) 1 MG tablet Take 1 tablet (1 mg total) by mouth 3 (three) times daily before meals. 90 tablet 11  . rosuvastatin (CRESTOR) 20 MG tablet TAKE 1 TABLET (20 MG TOTAL) BY MOUTH DAILY AT 6 PM. (Patient taking differently: Take 20 mg by mouth every evening. ) 90 tablet 1  . tadalafil (ADCIRCA/CIALIS) 20 MG tablet Take 1 tablet (20 mg total) by mouth daily as needed for erectile dysfunction. 10 tablet 0  . vitamin E (VITAMIN E) 1000 UNIT capsule Take 1,000 Units by mouth daily.     . Vitamin D, Ergocalciferol, (DRISDOL) 1.25 MG (50000 UT) CAPS capsule Take 50,000 Units by mouth every Tuesday.     No current facility-administered medications for this visit.     ROS:   General:  No weight loss, Fever, chills  HEENT: No recent headaches, no nasal bleeding, no visual changes, no sore throat  Neurologic: No dizziness, blackouts, seizures. No recent symptoms of stroke or mini- stroke. No recent episodes of slurred speech, or temporary blindness.  Cardiac: No recent episodes of chest pain/pressure, no shortness of breath at rest.  No shortness of breath with exertion.  Denies history of atrial fibrillation or irregular heartbeat  Vascular: No history of rest pain in feet.  No history of claudication.  No history of non-healing ulcer, No history of DVT   Pulmonary: No home oxygen, no productive cough, no hemoptysis,  No asthma or wheezing  Musculoskeletal:  _0  Arthritis, _1  Low back pain,  _2  Joint pain  Hematologic:No history of hypercoagulable  state.  No history of easy bleeding.  No history of anemia  Gastrointestinal: No hematochezia or melena,  No gastroesophageal reflux, no trouble swallowing  Urinary: _3  chronic Kidney disease, _4  on HD - _5  MWF or _6  TTHS, _7  Burning with urination, _8  Frequent urination, _9  Difficulty urinating;   Skin: No rashes  Psychological: No history of anxiety,  No history of depression   Physical Examination  Vitals:   06/04/19 1106  BP: (!) 167/79  Pulse: 69  Resp: 20  Temp: 97.6 F (36.4 C)  SpO2: 99%  Weight: 215 lb (97.5 kg)  Height: _10  (1.88 m)    Body mass index is 27.6 kg/m.  General:  Alert and oriented, no acute distress HEENT: Normal Neck: No JVD Pulmonary: Clear to auscultation bilaterally Cardiac: Regular Rate and Rhythm  Skin: No rash Extremity Pulses:  2+ radial, brachial pulses bilaterally Musculoskeletal: No deformity or edema  Neurologic: Upper and lower extremity motor 5/5 and symmetric  DATA:  Patient had a vein mapping ultrasound today which showed cephalic vein is less than 2 mm bilaterally.  Basilic vein was 3 to 4 mm bilaterally.  Normal upper extremity arterial duplex exam  ASSESSMENT: Patient needs long-term hemodialysis access.  Best vein currently would be basilic vein.   PLAN: Left basilic vein transposition fistula scheduled for Monday, June 08, 2019.  Risk benefits possible complications of procedure details including but not limited to bleeding infection ischemic steal non-maturation of the fistula.  He understands and agrees to proceed.   Ruta Hinds, MD [facility] Office: [phone] Pager: [phone]

## 2019-06-05 ENCOUNTER — Other Ambulatory Visit: Payer: Self-pay

## 2019-06-05 ENCOUNTER — Encounter (HOSPITAL_COMMUNITY): Payer: Self-pay | Admitting: *Deleted

## 2019-06-05 ENCOUNTER — Other Ambulatory Visit (HOSPITAL_COMMUNITY)
Admission: RE | Admit: 2019-06-05 | Discharge: 2019-06-05 | Disposition: A | Discharge status: Home / Self Care | Payer: BC Managed Care – PPO | Source: Ambulatory Visit | Attending: Vascular Surgery | Admitting: Vascular Surgery

## 2019-06-05 ENCOUNTER — Telehealth: Payer: Self-pay | Admitting: *Deleted

## 2019-06-05 DIAGNOSIS — Z01812 Encounter for preprocedural laboratory examination: Secondary | ICD-10-CM | POA: Diagnosis present

## 2019-06-05 DIAGNOSIS — Z20828 Contact with and (suspected) exposure to other viral communicable diseases: Secondary | ICD-10-CM | POA: Insufficient documentation

## 2019-06-05 LAB — SARS CORONAVIRUS 2 (TAT 6-24 HRS): SARS Coronavirus 2: NEGATIVE

## 2019-06-05 NOTE — Progress Notes (Signed)
Pt denies SOB and chest pain. Pt stated that he is under the care of Dr. Virgina Jock, Cardiology and Dr. [name], PCP. Pt made aware to stop taking vitamins, fish oil and herbal medications. Do not take any NSAIDs ie: Ibuprofen, Advil, Naproxen (Aleve), Motrin, BC and Goody Powder. Pt made aware to hold Prandin on DOS . Pt made aware to check BG every 2 hours prior to arrival to hospital on DOS. Pt made aware to treat a BG < 70 with 4 glucose tabs wait 15 minutes after intervention to recheck BG, if BG remains < 70, call Short Stay unit to speak with a nurse. Pt made aware that if BG is > 220 to take 1 unit of Novolog insulin on DOS.  Pt tested for COVID-19 on 06/05/19 and results are pending; pt reminded to quarantine.   Coronavirus Screening  Have you experienced the following symptoms:  Cough yes/no: No Fever (>100.40F)  yes/no: No Runny nose yes/no: No Sore throat yes/no: No Difficulty breathing/shortness of breath  yes/no: No  Have you or a family member traveled in the last 14 days and where? yes/no: No  Pt reminded that hospital visitation restrictions are in effect and the importance of the restrictions.   Pt verbalized understanding of all pre-op instructions.  PA, Anesthesiology, asked to review pt cardiac history; see note.

## 2019-06-05 NOTE — Telephone Encounter (Signed)
Arrival to [facility] admitting time change for 06/08/2019 surgery called to patient. Instructed to arrive at 11:00 am all other instructions unchanged and reminded to get nasal swab testing today. Verbalized understanding

## 2019-06-05 NOTE — Anesthesia Preprocedure Evaluation (Addendum)
Anesthesia Evaluation  Patient identified by MRN, date of birth, ID band Patient awake    Reviewed: Allergy & Precautions, NPO status , Patient's Chart, lab work & pertinent test results, reviewed documented beta blocker date and time   Airway Mallampati: III  TM Distance: >3 FB Neck ROM: Full    Dental  (+) Dental Advisory Given, Missing   Pulmonary former smoker,    Pulmonary exam normal breath sounds clear to auscultation       Cardiovascular hypertension, Pt. on home beta blockers and Pt. on medications + CAD, + Past MI, + Cardiac Stents and +CHF  Normal cardiovascular exam Rhythm:Regular Rate:Normal     Neuro/Psych  Neuromuscular disease negative psych ROS   GI/Hepatic Neg liver ROS, GERD  Medicated and Controlled,  Endo/Other  diabetes, Type 2, Insulin Dependent, Oral Hypoglycemic Agents  Renal/GU CRFRenal disease     Musculoskeletal  (+) Arthritis ,   Abdominal   Peds  Hematology  (+) Blood dyscrasia, anemia ,   Anesthesia Other Findings Day of surgery medications reviewed with the patient.  Reproductive/Obstetrics                            Anesthesia Physical Anesthesia Plan  ASA: III  Anesthesia Plan: General   Post-op Pain Management:    Induction: Intravenous  PONV Risk Score and Plan: 3 and Dexamethasone and Ondansetron  Airway Management Planned: LMA  Additional Equipment:   Intra-op Plan:   Post-operative Plan: Extubation in OR  Informed Consent: I have reviewed the patients History and Physical, chart, labs and discussed the procedure including the risks, benefits and alternatives for the proposed anesthesia with the patient or authorized representative who has indicated his/her understanding and acceptance.     Dental advisory given  Plan Discussed with: CRNA  Anesthesia Plan Comments:        Anesthesia Quick Evaluation

## 2019-06-05 NOTE — Progress Notes (Signed)
Anesthesia Chart Review: Kathleene Hazel   Case: [id number] Date/Time: 06/08/19 1445   Procedure: BASILIC VEIN TRANSPOSITION LEFT ARM (Left )   Anesthesia type: General   Pre-op diagnosis: chronic kidney disease   Location: [facility] OR ROOM 106 / [facility] [place] [facility]   Surgeon: Elam Dutch, MD      DISCUSSION: Patient is a 68 year old male scheduled for the above procedure. He is not yet on hemodialysis.   History includes former smoker (quit 1985), HTN, HLD, CKD III/IV, DM2 (with neuropathy, nephropathy), CAD (NSTEMI 10/16/18, s/p unsuccessful PTCA of occluded RCA, patent previously place pDI stent 10/20/18), CHF, GERD, anemia, monoclonal paraproteinemia, medication noncompliance.   He was last seen by cardiologist Dr. Virgina Jock on 03/31/19. Patient, "Stable without angina symptoms. Given his advanced CKD, I do not recommend further revascualrization attempts. Brilinta stopped due to concern for upper GI bleed. Continue Aspirin, if possible."  He is for labs on the day of surgery.    PROVIDERS: Billie Ruddy, MD is PCP  Vernell Leep, MD is cardiologist Renato Shin, MD is endocrinologist Sullivan Lone, MD is HEM-ONC Harrie Jeans, MD is nephrologist   LABS: He is for ISTAT4 or equivalent on the day of surgery. A1c 9.8% on 05/06/19.   IMAGES: DG Bone survey 04/24/19: IMPRESSION: 1. No  worrisome lytic bone lesion. 2. Sclerotic focus in the right iliac wing presumably benign bone island in the absence of a history of primary carcinoma. 3. Incidental findings as above.  CXR 12/10/18: IMPRESSION: Patchy RIGHT lung infiltrate has largely cleared, although mild increased markings are seen bilaterally, which could represent vascular congestion. Cardiomegaly.   EKG: 12/11/18: Sinus rhythm with Premature atrial complexes Left ventricular hypertrophy Nonspecific T wave abnormality Prolonged QT Abnormal ECG Confirmed by Thompson Grayer ([id number]) on 12/11/2018 10:17:54 PM   CV: Carotid  artery duplex 12/22/2018: Mild heterogeneous plaque in the bilateral carotid artery bulb (<50%).  Mild stenosis in the left external carotid artery (<50%). Antegrade right vertebral artery flow. Antegrade left vertebral artery flow. Follow up when clinically indicated.  Cardiac cath 10/20/2018 Virgina Jock, Manish, MD): LM: Normal LAD: Distal 70% diffuse disease.        Ostial 90% stenosis in Diag1. Patent prox Diag 1 stent LCx: Large OM1 with patent prior stent RCA: 100% occluded RPDA        Partially successful PTCA attempt        100%--99% stenosis        TIMI flow 0-I LVEDP 16 mmHg Recommendation: - At this point, chances of successful PCI without large amount of contrast were low. Thus, I decided to stop the procedure. Fortunately, his LVEF is normal with normal wall motion. Continue medical management with DAPT (prefer aspirin and Brilinta) and aggressive hypertension and diabetes management.  - In future, if he has recurrent angina symptoms, could then consider planned intervention to RCA without angiogram of left sided vessels (to minimze contrast). He does not have robust collaterals for retrograde revascularization, thus antegrade approach will still be his best chance.  Echo 10/17/18: Study Conclusions - Left ventricle: The cavity size was normal. Wall thickness was   increased in a pattern of severe LVH. Systolic function was   normal. The estimated ejection fraction was in the range of 50%   to 55%. Wall motion was normal; there were no regional wall   motion abnormalities. Doppler parameters are consistent with   abnormal left ventricular relaxation (grade 1 diastolic   dysfunction). Doppler parameters are consistent with high  ventricular filling pressure. - Left atrium: The atrium was moderately dilated. - Right atrium: The atrium was mildly dilated. Impressions: - Low normal to mildly reduced LV systolic function; EF 50; severe   LVH; mild diastolic dysfunction;  biatrial enlargement.   Past Medical History:  Diagnosis Date  . Allergy   . Anemia   . Blood transfusion without reported diagnosis   . Cataract   . CHF (congestive heart failure) ([place])   . CKD (chronic kidney disease) stage 3, GFR 30-59 ml/min (H[facility])   . Coronary artery disease   . Diabetic peripheral neuropathy ([place])   . GERD (gastroesophageal reflux disease)   . Hyperlipidemia   . Hypertension   . Hypertensive crisis 10/16/2018  . Myocardial infarction ([place])   . Noncompliance with medication regimen   . Osteoarthritis    "legs, back" (10/16/2018)  . Osteoporosis   . Pneumonia 2015/12/01   "real bad; I died and they had to bring me back" (10/16/2018)  . Seasonal allergies   . Type II diabetes mellitus ([place])     Past Surgical History:  Procedure Laterality Date  . CARDIAC CATHETERIZATION  10/20/2018  . CORONARY BALLOON ANGIOPLASTY N/A 10/20/2018   Procedure: CORONARY BALLOON ANGIOPLASTY;  Surgeon: Nigel Mormon, MD;  Location: [facility];  Service: Cardiovascular;  Laterality: N/A;  . EYE SURGERY    . JOINT REPLACEMENT    . LEFT HEART CATH AND CORONARY ANGIOGRAPHY N/A 10/20/2018   Procedure: LEFT HEART CATH AND CORONARY ANGIOGRAPHY;  Surgeon: Nigel Mormon, MD;  Location: [facility];  Service: Cardiovascular;  Laterality: N/A;  . TOTAL KNEE ARTHROPLASTY Right     MEDICATIONS: . acetaminophen (TYLENOL) 500 MG tablet  . amLODipine (NORVASC) 10 MG tablet  . ASPIRIN LOW DOSE 81 MG EC tablet  . Blood Glucose Monitoring Suppl (ACCU-CHEK GUIDE ME) w/Device KIT  . carvedilol (COREG) 12.5 MG tablet  . cholecalciferol (VITAMIN D) 25 MCG (1000 UT) tablet  . Continuous Blood Gluc Sensor (FREESTYLE LIBRE 14 DAY SENSOR) MISC  . Cyanocobalamin 2500 MCG CHEW  . feeding supplement, GLUCERNA SHAKE, (GLUCERNA SHAKE) LIQD  . furosemide (LASIX) 20 MG tablet  . gabapentin (NEURONTIN) 600 MG tablet  . insulin aspart (NOVOLOG FLEXPEN) 100 UNIT/ML FlexPen  .  linaclotide (LINZESS) 145 MCG CAPS capsule  . methocarbamol (ROBAXIN) 500 MG tablet  . montelukast (SINGULAIR) 10 MG tablet  . omeprazole (PRILOSEC) 20 MG capsule  . prednisoLONE acetate (PRED FORTE) 1 % ophthalmic suspension  . repaglinide (PRANDIN) 1 MG tablet  . rosuvastatin (CRESTOR) 20 MG tablet  . tadalafil (ADCIRCA/CIALIS) 20 MG tablet  . Vitamin D, Ergocalciferol, (DRISDOL) 1.25 MG (50000 UT) CAPS capsule  . vitamin E (VITAMIN E) 1000 UNIT capsule   No current facility-administered medications for this encounter.     Myra Gianotti, PA-C Surgical Short Stay/Anesthesiology [facility] Phone [phone] [facility] Phone [phone] 06/05/2019 10:39 AM

## 2019-06-08 ENCOUNTER — Encounter (HOSPITAL_COMMUNITY): Payer: Self-pay

## 2019-06-08 ENCOUNTER — Encounter (HOSPITAL_COMMUNITY)
Admission: RE | Disposition: A | Discharge status: Home / Self Care | Payer: Self-pay | Source: Home / Self Care | Attending: Vascular Surgery

## 2019-06-08 ENCOUNTER — Observation Stay (HOSPITAL_COMMUNITY)
Admission: RE | Admit: 2019-06-08 | Discharge: 2019-06-09 | Disposition: A | Discharge status: Home / Self Care | Payer: BC Managed Care – PPO | Attending: Vascular Surgery | Admitting: Vascular Surgery

## 2019-06-08 ENCOUNTER — Ambulatory Visit (HOSPITAL_COMMUNITY): Payer: BC Managed Care – PPO | Admitting: Certified Registered"

## 2019-06-08 ENCOUNTER — Ambulatory Visit (HOSPITAL_COMMUNITY): Payer: BC Managed Care – PPO | Admitting: Vascular Surgery

## 2019-06-08 ENCOUNTER — Other Ambulatory Visit: Payer: Self-pay

## 2019-06-08 DIAGNOSIS — Z87891 Personal history of nicotine dependence: Secondary | ICD-10-CM | POA: Insufficient documentation

## 2019-06-08 DIAGNOSIS — K219 Gastro-esophageal reflux disease without esophagitis: Secondary | ICD-10-CM | POA: Diagnosis not present

## 2019-06-08 DIAGNOSIS — E785 Hyperlipidemia, unspecified: Secondary | ICD-10-CM | POA: Diagnosis not present

## 2019-06-08 DIAGNOSIS — Z79899 Other long term (current) drug therapy: Secondary | ICD-10-CM | POA: Insufficient documentation

## 2019-06-08 DIAGNOSIS — N185 Chronic kidney disease, stage 5: Secondary | ICD-10-CM

## 2019-06-08 DIAGNOSIS — I132 Hypertensive heart and chronic kidney disease with heart failure and with stage 5 chronic kidney disease, or end stage renal disease: Secondary | ICD-10-CM | POA: Diagnosis not present

## 2019-06-08 DIAGNOSIS — Z7982 Long term (current) use of aspirin: Secondary | ICD-10-CM | POA: Insufficient documentation

## 2019-06-08 DIAGNOSIS — E1122 Type 2 diabetes mellitus with diabetic chronic kidney disease: Secondary | ICD-10-CM | POA: Diagnosis not present

## 2019-06-08 DIAGNOSIS — I2510 Atherosclerotic heart disease of native coronary artery without angina pectoris: Secondary | ICD-10-CM | POA: Insufficient documentation

## 2019-06-08 DIAGNOSIS — Z794 Long term (current) use of insulin: Secondary | ICD-10-CM | POA: Diagnosis not present

## 2019-06-08 DIAGNOSIS — N189 Chronic kidney disease, unspecified: Secondary | ICD-10-CM | POA: Diagnosis present

## 2019-06-08 DIAGNOSIS — E1142 Type 2 diabetes mellitus with diabetic polyneuropathy: Secondary | ICD-10-CM | POA: Diagnosis not present

## 2019-06-08 DIAGNOSIS — Z955 Presence of coronary angioplasty implant and graft: Secondary | ICD-10-CM | POA: Diagnosis not present

## 2019-06-08 DIAGNOSIS — Z96651 Presence of right artificial knee joint: Secondary | ICD-10-CM | POA: Insufficient documentation

## 2019-06-08 DIAGNOSIS — I252 Old myocardial infarction: Secondary | ICD-10-CM | POA: Insufficient documentation

## 2019-06-08 DIAGNOSIS — I509 Heart failure, unspecified: Secondary | ICD-10-CM | POA: Insufficient documentation

## 2019-06-08 HISTORY — PX: BASCILIC VEIN TRANSPOSITION: SHX5742

## 2019-06-08 HISTORY — DX: Unspecified firearm discharge, undetermined intent, initial encounter: Y24.9XXA

## 2019-06-08 HISTORY — DX: Accidental discharge from unspecified firearms or gun, initial encounter: W34.00XA

## 2019-06-08 LAB — GLUCOSE, CAPILLARY
Glucose-Capillary: 160 mg/dL — ABNORMAL HIGH (ref 70–99)
Glucose-Capillary: 147 mg/dL — ABNORMAL HIGH (ref 70–99)
Glucose-Capillary: 151 mg/dL — ABNORMAL HIGH (ref 70–99)
Glucose-Capillary: 303 mg/dL — ABNORMAL HIGH (ref 70–99)

## 2019-06-08 LAB — POCT I-STAT 4, (NA,K, GLUC, HGB,HCT)
Glucose, Bld: 168 mg/dL — ABNORMAL HIGH (ref 70–99)
HCT: 27 % — ABNORMAL LOW (ref 39.0–52.0)
Hemoglobin: 9.2 g/dL — ABNORMAL LOW (ref 13.0–17.0)
Potassium: 5 mmol/L (ref 3.5–5.1)
Sodium: 138 mmol/L (ref 135–145)

## 2019-06-08 SURGERY — TRANSPOSITION, VEIN, BASILIC
Anesthesia: General | Site: Arm Upper | Laterality: Left

## 2019-06-08 MED ORDER — DEXAMETHASONE SODIUM PHOSPHATE 10 MG/ML IJ SOLN
INTRAMUSCULAR | Status: AC
  Filled 2019-06-08: qty 1

## 2019-06-08 MED ORDER — ONDANSETRON HCL 4 MG/2ML IJ SOLN: 4.0000 mg | Freq: Four times a day (QID) | INTRAMUSCULAR | Status: DC | PRN

## 2019-06-08 MED ORDER — LABETALOL HCL 5 MG/ML IV SOLN: 10.0000 mg | INTRAVENOUS | Status: DC | PRN

## 2019-06-08 MED ORDER — ONDANSETRON HCL 4 MG/2ML IJ SOLN
INTRAMUSCULAR | Status: AC
  Filled 2019-06-08: qty 2

## 2019-06-08 MED ORDER — PHENYLEPHRINE 40 MCG/ML (10ML) SYRINGE FOR IV PUSH (FOR BLOOD PRESSURE SUPPORT)
PREFILLED_SYRINGE | INTRAVENOUS | Status: AC
  Filled 2019-06-08: qty 10

## 2019-06-08 MED ORDER — GABAPENTIN 600 MG PO TABS
600.0000 mg | ORAL_TABLET | Freq: Three times a day (TID) | ORAL | Status: DC
  Administered 2019-06-08 – 2019-06-09 (×2): 600 mg via ORAL
  Filled 2019-06-08 (×2): qty 1

## 2019-06-08 MED ORDER — METOPROLOL TARTRATE 5 MG/5ML IV SOLN: 2.0000 mg | INTRAVENOUS | Status: DC | PRN

## 2019-06-08 MED ORDER — HYDRALAZINE HCL 20 MG/ML IJ SOLN
10.0000 mg | Freq: Once | INTRAMUSCULAR | Status: AC
  Administered 2019-06-08: 10 mg via INTRAVENOUS

## 2019-06-08 MED ORDER — ACETAMINOPHEN 500 MG PO TABS
1000.0000 mg | ORAL_TABLET | Freq: Once | ORAL | Status: AC
  Administered 2019-06-08: 1000 mg via ORAL
  Filled 2019-06-08: qty 2

## 2019-06-08 MED ORDER — ONDANSETRON HCL 4 MG/2ML IJ SOLN: 4.0000 mg | Freq: Once | INTRAMUSCULAR | Status: DC | PRN

## 2019-06-08 MED ORDER — GUAIFENESIN-DM 100-10 MG/5ML PO SYRP: 15.0000 mL | ORAL_SOLUTION | ORAL | Status: DC | PRN

## 2019-06-08 MED ORDER — CARVEDILOL 12.5 MG PO TABS
12.5000 mg | ORAL_TABLET | Freq: Two times a day (BID) | ORAL | Status: DC
  Administered 2019-06-08 – 2019-06-09 (×2): 12.5 mg via ORAL
  Filled 2019-06-08 (×2): qty 1

## 2019-06-08 MED ORDER — SODIUM CHLORIDE 0.9 % IV SOLN
INTRAVENOUS | Status: DC | PRN
  Administered 2019-06-08: 50 ug/min via INTRAVENOUS

## 2019-06-08 MED ORDER — POTASSIUM CHLORIDE CRYS ER 20 MEQ PO TBCR: 20.0000 meq | EXTENDED_RELEASE_TABLET | Freq: Once | ORAL | Status: DC

## 2019-06-08 MED ORDER — ACETAMINOPHEN 325 MG PO TABS: 325.0000 mg | ORAL_TABLET | ORAL | Status: DC | PRN

## 2019-06-08 MED ORDER — ROSUVASTATIN CALCIUM 20 MG PO TABS
20.0000 mg | ORAL_TABLET | Freq: Every evening | ORAL | Status: DC
  Administered 2019-06-08: 20 mg via ORAL
  Filled 2019-06-08: qty 1

## 2019-06-08 MED ORDER — FUROSEMIDE 20 MG PO TABS
40.0000 mg | ORAL_TABLET | Freq: Every day | ORAL | Status: DC
  Administered 2019-06-08 – 2019-06-09 (×2): 40 mg via ORAL
  Filled 2019-06-08 (×2): qty 2

## 2019-06-08 MED ORDER — MORPHINE SULFATE (PF) 2 MG/ML IV SOLN: 1.0000 mg | INTRAVENOUS | Status: DC | PRN

## 2019-06-08 MED ORDER — PHENOL 1.4 % MT LIQD: 1.0000 | OROMUCOSAL | Status: DC | PRN

## 2019-06-08 MED ORDER — FENTANYL CITRATE (PF) 100 MCG/2ML IJ SOLN: 25.0000 ug | INTRAMUSCULAR | Status: DC | PRN

## 2019-06-08 MED ORDER — 0.9 % SODIUM CHLORIDE (POUR BTL) OPTIME
TOPICAL | Status: DC | PRN
  Administered 2019-06-08: 1000 mL

## 2019-06-08 MED ORDER — PHENYLEPHRINE 40 MCG/ML (10ML) SYRINGE FOR IV PUSH (FOR BLOOD PRESSURE SUPPORT)
PREFILLED_SYRINGE | INTRAVENOUS | Status: DC | PRN
  Administered 2019-06-08 (×2): 40 ug via INTRAVENOUS
  Administered 2019-06-08: 80 ug via INTRAVENOUS

## 2019-06-08 MED ORDER — HYDRALAZINE HCL 20 MG/ML IJ SOLN: 5.0000 mg | INTRAMUSCULAR | Status: DC | PRN

## 2019-06-08 MED ORDER — FENTANYL CITRATE (PF) 250 MCG/5ML IJ SOLN
INTRAMUSCULAR | Status: AC
  Filled 2019-06-08: qty 5

## 2019-06-08 MED ORDER — HEPARIN SODIUM (PORCINE) 1000 UNIT/ML IJ SOLN
INTRAMUSCULAR | Status: DC | PRN
  Administered 2019-06-08: 3000 [IU] via INTRAVENOUS

## 2019-06-08 MED ORDER — LINACLOTIDE 145 MCG PO CAPS: 145.0000 ug | ORAL_CAPSULE | Freq: Every day | ORAL | Status: DC

## 2019-06-08 MED ORDER — PHENYLEPHRINE HCL (PRESSORS) 10 MG/ML IV SOLN
INTRAVENOUS | Status: AC
  Filled 2019-06-08: qty 1

## 2019-06-08 MED ORDER — METHOCARBAMOL 500 MG PO TABS: 500.0000 mg | ORAL_TABLET | Freq: Two times a day (BID) | ORAL | Status: DC | PRN

## 2019-06-08 MED ORDER — AMLODIPINE BESYLATE 5 MG PO TABS
10.0000 mg | ORAL_TABLET | Freq: Every day | ORAL | Status: DC
  Administered 2019-06-08: 10 mg via ORAL
  Filled 2019-06-08: qty 2

## 2019-06-08 MED ORDER — BISACODYL 10 MG RE SUPP: 10.0000 mg | Freq: Every day | RECTAL | Status: DC | PRN

## 2019-06-08 MED ORDER — EPHEDRINE SULFATE-NACL 50-0.9 MG/10ML-% IV SOSY
PREFILLED_SYRINGE | INTRAVENOUS | Status: DC | PRN
  Administered 2019-06-08: 10 mg via INTRAVENOUS
  Administered 2019-06-08: 5 mg via INTRAVENOUS
  Administered 2019-06-08: 10 mg via INTRAVENOUS

## 2019-06-08 MED ORDER — SODIUM CHLORIDE 0.9 % IV SOLN
INTRAVENOUS | Status: AC
  Filled 2019-06-08: qty 1.2

## 2019-06-08 MED ORDER — OXYCODONE-ACETAMINOPHEN 5-325 MG PO TABS: 1.0000 | ORAL_TABLET | Freq: Four times a day (QID) | ORAL | 0 refills | Status: DC | PRN

## 2019-06-08 MED ORDER — ACETAMINOPHEN 325 MG RE SUPP
325.0000 mg | RECTAL | Status: DC | PRN
  Filled 2019-06-08: qty 2

## 2019-06-08 MED ORDER — MONTELUKAST SODIUM 10 MG PO TABS
10.0000 mg | ORAL_TABLET | Freq: Every day | ORAL | Status: DC
  Administered 2019-06-08: 10 mg via ORAL
  Filled 2019-06-08: qty 1

## 2019-06-08 MED ORDER — LIDOCAINE HCL (PF) 1 % IJ SOLN
INTRAMUSCULAR | Status: AC
  Filled 2019-06-08: qty 30

## 2019-06-08 MED ORDER — LIDOCAINE 2% (20 MG/ML) 5 ML SYRINGE
INTRAMUSCULAR | Status: AC
  Filled 2019-06-08: qty 5

## 2019-06-08 MED ORDER — GLUCERNA SHAKE PO LIQD
237.0000 mL | Freq: Three times a day (TID) | ORAL | Status: DC
  Administered 2019-06-08: 237 mL via ORAL

## 2019-06-08 MED ORDER — HEPARIN SODIUM (PORCINE) 1000 UNIT/ML IJ SOLN
INTRAMUSCULAR | Status: AC
  Filled 2019-06-08: qty 2

## 2019-06-08 MED ORDER — PROPOFOL 10 MG/ML IV BOLUS
INTRAVENOUS | Status: AC
  Filled 2019-06-08: qty 20

## 2019-06-08 MED ORDER — HYDRALAZINE HCL 20 MG/ML IJ SOLN
INTRAMUSCULAR | Status: AC
  Administered 2019-06-08: 10 mg via INTRAVENOUS
  Filled 2019-06-08: qty 1

## 2019-06-08 MED ORDER — DEXAMETHASONE SODIUM PHOSPHATE 10 MG/ML IJ SOLN
INTRAMUSCULAR | Status: DC | PRN
  Administered 2019-06-08: 5 mg via INTRAVENOUS

## 2019-06-08 MED ORDER — PHENYLEPHRINE HCL (PRESSORS) 10 MG/ML IV SOLN
INTRAVENOUS | Status: AC
  Filled 2019-06-08: qty 2

## 2019-06-08 MED ORDER — AMLODIPINE BESYLATE 10 MG PO TABS
10.0000 mg | ORAL_TABLET | Freq: Every day | ORAL | Status: DC
  Administered 2019-06-08 – 2019-06-09 (×2): 10 mg via ORAL
  Filled 2019-06-08 (×2): qty 1

## 2019-06-08 MED ORDER — HEPARIN SODIUM (PORCINE) 1000 UNIT/ML IJ SOLN
INTRAMUSCULAR | Status: AC
  Filled 2019-06-08: qty 1

## 2019-06-08 MED ORDER — LIDOCAINE HCL (CARDIAC) PF 100 MG/5ML IV SOSY
PREFILLED_SYRINGE | INTRAVENOUS | Status: DC | PRN
  Administered 2019-06-08: 60 mg via INTRAVENOUS

## 2019-06-08 MED ORDER — EPHEDRINE 5 MG/ML INJ
INTRAVENOUS | Status: AC
  Filled 2019-06-08: qty 10

## 2019-06-08 MED ORDER — POLYETHYLENE GLYCOL 3350 17 G PO PACK: 17.0000 g | PACK | Freq: Every day | ORAL | Status: DC | PRN

## 2019-06-08 MED ORDER — CEFAZOLIN SODIUM-DEXTROSE 2-4 GM/100ML-% IV SOLN
2.0000 g | INTRAVENOUS | Status: AC
  Administered 2019-06-08: 2 g via INTRAVENOUS
  Filled 2019-06-08: qty 100

## 2019-06-08 MED ORDER — ONDANSETRON HCL 4 MG/2ML IJ SOLN
INTRAMUSCULAR | Status: DC | PRN
  Administered 2019-06-08: 4 mg via INTRAVENOUS

## 2019-06-08 MED ORDER — SODIUM CHLORIDE 0.9 % IV SOLN
INTRAVENOUS | Status: DC
  Administered 2019-06-08: 12:00:00 via INTRAVENOUS

## 2019-06-08 MED ORDER — PANTOPRAZOLE SODIUM 40 MG PO TBEC
40.0000 mg | DELAYED_RELEASE_TABLET | Freq: Every day | ORAL | Status: DC
  Administered 2019-06-08: 40 mg via ORAL
  Filled 2019-06-08: qty 1

## 2019-06-08 MED ORDER — ALUM & MAG HYDROXIDE-SIMETH 200-200-20 MG/5ML PO SUSP: 15.0000 mL | ORAL | Status: DC | PRN

## 2019-06-08 MED ORDER — INSULIN ASPART 100 UNIT/ML ~~LOC~~ SOLN
0.0000 [IU] | Freq: Three times a day (TID) | SUBCUTANEOUS | Status: DC
  Administered 2019-06-09: 3 [IU] via SUBCUTANEOUS

## 2019-06-08 MED ORDER — FENTANYL CITRATE (PF) 250 MCG/5ML IJ SOLN
INTRAMUSCULAR | Status: DC | PRN
  Administered 2019-06-08: 50 ug via INTRAVENOUS

## 2019-06-08 MED ORDER — REPAGLINIDE 1 MG PO TABS
1.0000 mg | ORAL_TABLET | Freq: Three times a day (TID) | ORAL | Status: DC
  Administered 2019-06-09: 1 mg via ORAL
  Filled 2019-06-08 (×2): qty 1

## 2019-06-08 MED ORDER — OXYCODONE-ACETAMINOPHEN 5-325 MG PO TABS: 1.0000 | ORAL_TABLET | Freq: Four times a day (QID) | ORAL | Status: DC | PRN

## 2019-06-08 MED ORDER — PROPOFOL 10 MG/ML IV BOLUS
INTRAVENOUS | Status: DC | PRN
  Administered 2019-06-08: 140 mg via INTRAVENOUS

## 2019-06-08 MED ORDER — ASPIRIN EC 81 MG PO TBEC
81.0000 mg | DELAYED_RELEASE_TABLET | Freq: Every day | ORAL | Status: DC
  Administered 2019-06-09: 81 mg via ORAL
  Filled 2019-06-08 (×4): qty 1

## 2019-06-08 MED ORDER — SODIUM CHLORIDE 0.9 % IV SOLN
INTRAVENOUS | Status: DC | PRN
  Administered 2019-06-08: 500 mL

## 2019-06-08 SURGICAL SUPPLY — 36 items
ARMBAND PINK RESTRICT EXTREMIT (MISCELLANEOUS) ×3 IMPLANT
CANISTER SUCT 3000ML PPV (MISCELLANEOUS) ×3 IMPLANT
CANNULA VESSEL 3MM 2 BLNT TIP (CANNULA) ×3 IMPLANT
CLIP VESOCCLUDE MED 24/CT (CLIP) ×3 IMPLANT
CLIP VESOCCLUDE SM WIDE 24/CT (CLIP) ×3 IMPLANT
COVER PROBE W GEL 5X96 (DRAPES) IMPLANT
COVER WAND RF STERILE (DRAPES) ×3 IMPLANT
DECANTER SPIKE VIAL GLASS SM (MISCELLANEOUS) ×3 IMPLANT
DERMABOND ADVANCED (GAUZE/BANDAGES/DRESSINGS) ×2
DERMABOND ADVANCED .7 DNX12 (GAUZE/BANDAGES/DRESSINGS) ×1 IMPLANT
DRAIN PENROSE 1/4X12 LTX STRL (WOUND CARE) IMPLANT
ELECT REM PT RETURN 9FT ADLT (ELECTROSURGICAL) ×3
ELECTRODE REM PT RTRN 9FT ADLT (ELECTROSURGICAL) ×1 IMPLANT
GLOVE BIO SURGEON STRL SZ7.5 (GLOVE) ×3 IMPLANT
GOWN STRL REUS W/ TWL LRG LVL3 (GOWN DISPOSABLE) ×3 IMPLANT
GOWN STRL REUS W/TWL LRG LVL3 (GOWN DISPOSABLE) ×6
HEMOSTAT SPONGE AVITENE ULTRA (HEMOSTASIS) IMPLANT
KIT BASIN OR (CUSTOM PROCEDURE TRAY) ×3 IMPLANT
KIT TURNOVER KIT B (KITS) ×3 IMPLANT
LOOP VESSEL MINI RED (MISCELLANEOUS) IMPLANT
NS IRRIG 1000ML POUR BTL (IV SOLUTION) ×3 IMPLANT
PACK CV ACCESS (CUSTOM PROCEDURE TRAY) ×3 IMPLANT
PAD ARMBOARD 7.5X6 YLW CONV (MISCELLANEOUS) ×6 IMPLANT
SUT MNCRL AB 4-0 PS2 18 (SUTURE) ×3 IMPLANT
SUT PROLENE 6 0 BV (SUTURE) ×3 IMPLANT
SUT PROLENE 7 0 BV 1 (SUTURE) ×3 IMPLANT
SUT SILK 2 0 SH (SUTURE) IMPLANT
SUT SILK 3 0 (SUTURE) ×2
SUT SILK 3-0 18XBRD TIE 12 (SUTURE) ×1 IMPLANT
SUT VIC AB 3-0 SH 27 (SUTURE) ×4
SUT VIC AB 3-0 SH 27X BRD (SUTURE) ×2 IMPLANT
SUT VIC AB 4-0 PS2 18 (SUTURE) ×3 IMPLANT
SUT VICRYL 4-0 PS2 18IN ABS (SUTURE) ×3 IMPLANT
TOWEL GREEN STERILE (TOWEL DISPOSABLE) ×3 IMPLANT
UNDERPAD 30X30 (UNDERPADS AND DIAPERS) ×3 IMPLANT
WATER STERILE IRR 1000ML POUR (IV SOLUTION) ×3 IMPLANT

## 2019-06-08 NOTE — Anesthesia Procedure Notes (Signed)
Procedure Name: LMA Insertion Date/Time: 06/08/2019 3:49 PM Performed by: Teressa Lower., CRNA Pre-anesthesia Checklist: Patient identified, Emergency Drugs available, Suction available, Patient being monitored and Timeout performed Patient Re-evaluated:Patient Re-evaluated prior to induction Oxygen Delivery Method: Circle system utilized Preoxygenation: Pre-oxygenation with 100% oxygen Induction Type: IV induction LMA: LMA inserted LMA Size: 5.0 Number of attempts: 1 Placement Confirmation: positive ETCO2 and breath sounds checked- equal and bilateral Tube secured with: Tape Dental Injury: Teeth and Oropharynx as per pre-operative assessment

## 2019-06-08 NOTE — Op Note (Signed)
Procedure: Left arm first stage basilic vein transposition fistula  Preoperative diagnosis: CKD5  Postoperative diagnosis: Same  Anesthesia: General  Assistant: Gerri Lins, PA-C  Operative findings: #1 3 mm basilic vein  Operative details: After team informed consent, the patient was taken the operating.  The patient placed supine position operating table.  After induction general anesthesia patient's entire left upper extremities prepped and draped in usual sterile fashion.  Ultrasound was used to identify the patient's left basilic vein.  Longitudinal incision was made over this near to the medial epicondyle.  Incision was carried down through subcutaneous tissues down the level of the vein.  The vein was of reasonable but not robust quality.  It was about 3 mm in diameter.  It was dissected free circumferentially and small side branches ligated and divided tween silk ties.  It was mobilized over about 7 cm.  Next an additional longitudinal incision was made over the brachial artery near the antecubital region.  Incision was carried on through subcutaneous tissues down the level of the brachial artery.  Several small tributary adjacent veins were ligated and divided tween silk ties.  Vesseloops were placed proximally distally on the artery.  Patient was given 3000 units of intravenous heparin.  The vein was transected distally and ligated with a 2-0 silk tie.  It was then swung over to the artery by transposing it under the subcutaneous skin bridge tunnel to the artery.  Longitude opening was made in the brachial artery and the vein sewn end of vein to side of artery using a running 6-0 Prolene suture.  Despite completion anastomosis it was 4 by backbled and thoroughly flushed reanastomosed was secured Vesseloops released there is a palpable thrill in the fistula immediately.  Hemostasis was obtained.  Subcutaneous tissues of both incisions were reapproximated using running 3-0 Vicryl suture.   Skin of both incisions was closed with a 4-0 Vicryl subcuticular stitch.  Dermabond was applied.  Patient tolerated the procedure well and there were no complications.  The instrument sponge and needle counts correct the end of the case.  The patient was taken the recovery room in stable condition.  Ruta Hinds, MD [facility] Office: [phone] Pager: [phone]

## 2019-06-08 NOTE — Anesthesia Postprocedure Evaluation (Signed)
Anesthesia Post Note  Patient: William Webb [facility]  Procedure(s) Performed: BASILIC VEIN TRANSPOSITION LEFT ARM Stage 1 (Left Arm Upper)     Patient location during evaluation: PACU Anesthesia Type: General Level of consciousness: awake and alert Pain management: pain level controlled Vital Signs Assessment: post-procedure vital signs reviewed and stable Respiratory status: spontaneous breathing, nonlabored ventilation, respiratory function stable and patient connected to nasal cannula oxygen Cardiovascular status: blood pressure returned to baseline and stable Postop Assessment: no apparent nausea or vomiting Anesthetic complications: no    Last Vitals:  Vitals:   06/08/19 1720 06/08/19 1735  BP: (!) 156/88   Pulse: 71   Resp: 14   Temp:  (!) 36.4 C  SpO2: 97%     Last Pain:  Vitals:   06/08/19 1735  TempSrc:   PainSc: 0-No pain                 Catalina Gravel

## 2019-06-08 NOTE — Transfer of Care (Signed)
Immediate Anesthesia Transfer of Care Note  Patient: William Webb [facility]  Procedure(s) Performed: BASILIC VEIN TRANSPOSITION LEFT ARM Stage 1 (Left Arm Upper)  Patient Location: PACU  Anesthesia Type:General  Level of Consciousness: awake, alert  and oriented  Airway & Oxygen Therapy: Patient Spontanous Breathing and Patient connected to face mask oxygen  Post-op Assessment: Report given to RN and Post -op Vital signs reviewed and stable  Post vital signs: Reviewed and stable  Last Vitals:  Vitals Value Taken Time  BP 170/85 06/08/19 1704  Temp    Pulse 71 06/08/19 1705  Resp 13 06/08/19 1705  SpO2 100 % 06/08/19 1705  Vitals shown include unvalidated device data.  Last Pain:  Vitals:   06/08/19 1213  TempSrc:   PainSc: 0-No pain         Complications: No apparent anesthesia complications

## 2019-06-08 NOTE — Discharge Instructions (Signed)
° °  [facility] [place] ° °Discharge Instructions ° °AV Fistula or Graft Surgery for Dialysis Access ° °Please refer to the following instructions for your post-procedure care. Your surgeon or physician assistant will discuss any changes with you. ° °Activity ° °You may drive the day following your surgery, if you are comfortable and no longer taking prescription pain medication. Resume full activity as the soreness in your incision resolves. ° °Bathing/Showering ° °You may shower after you go home. Keep your incision dry for 48 hours. Do not soak in a bathtub, hot tub, or swim until the incision heals completely. You may not shower if you have a hemodialysis catheter. ° °Incision Care ° °Clean your incision with mild soap and water after 48 hours. Pat the area dry with a clean towel. You do not need a bandage unless otherwise instructed. Do not apply any ointments or creams to your incision. You may have skin glue on your incision. Do not peel it off. It will come off on its own in about one week. Your arm may swell a bit after surgery. To reduce swelling use pillows to elevate your arm so it is above your heart. Your doctor will tell you if you need to lightly wrap your arm with an ACE bandage. ° °Diet ° °Resume your normal diet. There are not special food restrictions following this procedure. In order to heal from your surgery, it is CRITICAL to get adequate nutrition. Your body requires vitamins, minerals, and protein. Vegetables are the best source of vitamins and minerals. Vegetables also provide the perfect balance of protein. Processed food has little nutritional value, so try to avoid this. ° °Medications ° °Resume taking all of your medications. If your incision is causing pain, you may take over-the counter pain relievers such as acetaminophen (Tylenol). If you were prescribed a stronger pain medication, please be aware these medications can cause nausea and constipation. Prevent  nausea by taking the medication with a snack or meal. Avoid constipation by drinking plenty of fluids and eating foods with high amount of fiber, such as fruits, vegetables, and grains. Do not take Tylenol if you are taking prescription pain medications. ° ° ° ° °Follow up °Your surgeon may want to see you in the office following your access surgery. If so, this will be arranged at the time of your surgery. ° °Please call us immediately for any of the following conditions: ° °Increased pain, redness, drainage (pus) from your incision site °Fever of 101 degrees or higher °Severe or worsening pain at your incision site °Hand pain or numbness. ° °Reduce your risk of vascular disease: ° °Stop smoking. If you would like help, call [facility] at 1-800-QUIT-NOW (1-800-784-8669) or [place] at 336-586-4000 ° °Manage your cholesterol °Maintain a desired weight °Control your diabetes °Keep your blood pressure down ° °Dialysis ° °It will take several weeks to several months for your new dialysis access to be ready for use. Your surgeon will determine when it is OK to use it. Your nephrologist will continue to direct your dialysis. You can continue to use your Permcath until your new access is ready for use. ° °If you have any questions, please call the office at [phone]. ° °

## 2019-06-08 NOTE — Progress Notes (Signed)
Pt's BP @ 12:30 - 216/102  Dr. Gifford Shave notified. Give pt's home dose of Amlodipine, per Dr. Gifford Shave.   Amlodipine 10mg 

## 2019-06-08 NOTE — Interval H&P Note (Signed)
History and Physical Interval Note:  06/08/2019 2:42 PM  William Webb  has presented today for surgery, with the diagnosis of chronic kidney disease.  The various methods of treatment have been discussed with the patient and family. After consideration of risks, benefits and other options for treatment, the patient has consented to  Procedure(s): BASILIC VEIN TRANSPOSITION LEFT ARM (Left) as a surgical intervention.  The patient's history has been reviewed, patient examined, no change in status, stable for surgery.  I have reviewed the patient's chart and labs.  Questions were answered to the patient's satisfaction.     Ruta Hinds

## 2019-06-09 ENCOUNTER — Encounter (HOSPITAL_COMMUNITY): Payer: Self-pay | Admitting: Vascular Surgery

## 2019-06-09 ENCOUNTER — Encounter (INDEPENDENT_AMBULATORY_CARE_PROVIDER_SITE_OTHER): Payer: BC Managed Care – PPO | Admitting: Ophthalmology

## 2019-06-09 DIAGNOSIS — I132 Hypertensive heart and chronic kidney disease with heart failure and with stage 5 chronic kidney disease, or end stage renal disease: Secondary | ICD-10-CM | POA: Diagnosis not present

## 2019-06-09 LAB — GLUCOSE, CAPILLARY: Glucose-Capillary: 240 mg/dL — ABNORMAL HIGH (ref 70–99)

## 2019-06-09 NOTE — Plan of Care (Signed)
  Problem: Education: Goal: Knowledge of General Education information will improve Description: Including pain rating scale, medication(s)/side effects and non-pharmacologic comfort measures Outcome: Adequate for Discharge   Problem: Health Behavior/Discharge Planning: Goal: Ability to manage health-related needs will improve Outcome: Adequate for Discharge   Problem: Clinical Measurements: Goal: Will remain free from infection Outcome: Adequate for Discharge   Problem: Safety: Goal: Ability to remain free from injury will improve Outcome: Adequate for Discharge   Problem: Skin Integrity: Goal: Risk for impaired skin integrity will decrease Outcome: Adequate for Discharge

## 2019-06-09 NOTE — Progress Notes (Signed)
Patient provided with verbal discharge instructions. Paper copy of AVS provided to patient. No further questions from patient. Patient IV removed per orders. Pt VSS at discharge. Patient d/c by RN via wheelchair through front entrance to private vehicle.

## 2019-06-09 NOTE — Progress Notes (Signed)
Left arm fistula with excellent thrill.  No pain or numbness in left hand with palpable left radial pulse.   No evidence of steal sx.  Incisions look good.   D/c home and f/u with Dr. Oneida Alar in 4-6 weeks for duplex.  Leontine Locket, [facility] 06/09/2019 6:33 AM

## 2019-06-10 NOTE — Telephone Encounter (Signed)
Form from [place] filled out and placed on Dr. Cordelia Pen desk for [employer] for [employer] sensors.
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
## 2019-06-19 ENCOUNTER — Telehealth: Payer: Self-pay | Admitting: *Deleted

## 2019-06-19 ENCOUNTER — Ambulatory Visit (INDEPENDENT_AMBULATORY_CARE_PROVIDER_SITE_OTHER): Payer: Self-pay | Admitting: Physician Assistant

## 2019-06-19 ENCOUNTER — Other Ambulatory Visit: Payer: Self-pay

## 2019-06-19 VITALS — BP 180/85 | HR 78 | Temp 97.9°F | Resp 14 | Ht 75.0 in | Wt 218.0 lb

## 2019-06-19 DIAGNOSIS — N185 Chronic kidney disease, stage 5: Secondary | ICD-10-CM

## 2019-06-19 NOTE — Telephone Encounter (Signed)
Called today requesting to be seen due to prolonged swelling and drainage from left arm. S/P first stage BVT 06/08/2019. Denies any heat or redness, fever or chills. Claims fluid is clear. Instructed to keep arm elevated above level of heart and keep clean and covered. Appt today with PA given.

## 2019-06-19 NOTE — Progress Notes (Signed)
POST OPERATIVE OFFICE NOTE    CC:  F/u for surgery  HPI:  This is a 68 y.o. male who is s/p left UE first stage basilic fistula creation 06/08/19.  He called today stating he has had edema and drainage from the incision site.  He denise pain, loss of motor or sensation.    No Known Allergies  Current Outpatient Medications  Medication Sig Dispense Refill  . acetaminophen (TYLENOL) 500 MG tablet Take 2 tablets (1,000 mg total) by mouth 2 (two) times daily as needed for moderate pain. 30 tablet 0  . amLODipine (NORVASC) 10 MG tablet Take 1 tablet (10 mg total) by mouth daily. 90 tablet 3  . ASPIRIN LOW DOSE 81 MG EC tablet TAKE 1 TABLET BY MOUTH EVERY DAY (Patient taking differently: Take 81 mg by mouth daily. ) 90 tablet 3  . Blood Glucose Monitoring Suppl (ACCU-CHEK GUIDE ME) w/Device KIT 1 Device by Does not apply route 4 (four) times daily -  before meals and at bedtime. 1 kit 0  . carvedilol (COREG) 12.5 MG tablet TAKE 1 TABLET (12.5 MG TOTAL) BY MOUTH 2 (TWO) TIMES DAILY. 180 tablet 1  . cholecalciferol (VITAMIN D) 25 MCG (1000 UT) tablet Take 1,000 Units by mouth daily.     . Continuous Blood Gluc Sensor (FREESTYLE LIBRE 14 DAY SENSOR) MISC 1 Device by Does not apply route every 14 (fourteen) days. 6 each 3  . Cyanocobalamin 2500 MCG CHEW Chew 2,500 mcg by mouth daily.    . feeding supplement, GLUCERNA SHAKE, (GLUCERNA SHAKE) LIQD Take 237 mLs by mouth 3 (three) times daily between meals.  0  . furosemide (LASIX) 20 MG tablet TAKE 2 TABLETS BY MOUTH IN THE MORNING AND ADDITIONAL 1 TABLET IN AFTERNOON,IF PERSISTENT LEG EDEMA (Patient taking differently: Take 20-40 mg by mouth See admin instructions. Take 40 mg by mouth in the morning, may take an additional 20 mg in the afternoon for persistent edema) 90 tablet 0  . gabapentin (NEURONTIN) 600 MG tablet TAKE 2 TABLETS BY MOUTH 3 TIMES A DAY (Patient taking differently: Take 600 mg by mouth 3 (three) times daily. ) 180 tablet 3  . insulin  aspart (NOVOLOG FLEXPEN) 100 UNIT/ML FlexPen Inject 3 Units into the skin 3 (three) times daily with meals. 15 mL 11  . linaclotide (LINZESS) 145 MCG CAPS capsule Take 1 capsule (145 mcg total) by mouth daily as needed (constipation). (Patient taking differently: Take 145 mcg by mouth daily before breakfast. ) 30 capsule 0  . methocarbamol (ROBAXIN) 500 MG tablet TAKE 1 TABLET BY MOUTH TWICE DAILY AS NEEDED FOR MUSCLE SPASM (Patient taking differently: Take 500 mg by mouth 2 (two) times daily as needed for muscle spasms. ) 120 tablet 1  . montelukast (SINGULAIR) 10 MG tablet Take 1 tablet (10 mg total) by mouth at bedtime. 90 tablet 3  . omeprazole (PRILOSEC) 20 MG capsule Take 20 mg by mouth every evening.     . repaglinide (PRANDIN) 1 MG tablet Take 1 tablet (1 mg total) by mouth 3 (three) times daily before meals. 90 tablet 11  . rosuvastatin (CRESTOR) 20 MG tablet TAKE 1 TABLET (20 MG TOTAL) BY MOUTH DAILY AT 6 PM. (Patient taking differently: Take 20 mg by mouth every evening. ) 90 tablet 1  . tadalafil (ADCIRCA/CIALIS) 20 MG tablet Take 1 tablet (20 mg total) by mouth daily as needed for erectile dysfunction. 10 tablet 0  . Vitamin D, Ergocalciferol, (DRISDOL) 1.25 MG (50000 UT) CAPS  capsule Take 50,000 Units by mouth every Tuesday.    . vitamin E (VITAMIN E) 1000 UNIT capsule Take 1,000 Units by mouth daily.      No current facility-administered medications for this visit.      ROS:  See HPI  Physical Exam:    Incision:  Left Ac area incision at anastomosis with localized minimal edema and clear drainer.  No erythema and no purulent drainage.   Extremities:  Motor, sensation and grip 5/5 equal B UE.  Assessment/Plan:  This is a 68 y.o. male who is s/p: First stage basilic transposition with incisional lyphocele drainage.    No symptoms of steal, no fever or chills reported.  He is currently not on HD.  Dry dressings as needed until the drainage stops.  He will f/u in 2 weeks for  incisional check.  If he develops fever or chills, erythema or purulent drainage he will call sooner.    Roxy Horseman PA-C [facility] [phone]  Clinic MD:  Donzetta Matters

## 2019-06-22 ENCOUNTER — Other Ambulatory Visit: Payer: Self-pay

## 2019-06-22 NOTE — Progress Notes (Addendum)
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       Dilation    Both eyes: 1.0% Mydriacyl, 2.5% Phenylephrine @ 1:10 PM        Slit Lamp and Fundus Exam    Slit Lamp Exam      Right Left   Lids/Lashes Dermatochalasis - upper lid, Meibomian gland dysfunction Dermatochalasis - upper lid, Meibomian gland dysfunction   Conjunctiva/Sclera Mild Melanosis, mild Conjunctivochalasis, 0.7 mm lesion temporal conj Mild Melanosis, inferior Conjunctivochalasis   Cornea 1+ Punctate epithelial erosions, 3+ pigmented guttata, 1+ central haze and edema, 1+ Descemet's folds, well healed temporal cataract wound with tr edema surrounding 3+pigmented gutta, 1+ Punctate epithelial erosions, 1+ endo pigment   Anterior Chamber moderate depth, narrow angles, 0.5+ cell/pigment moderate depth, narrow angles   Iris  moderatetly dilated, No NVI Round and dilated, No NVI   Lens PC IOL in good position 2-3+ Nuclear sclerosis, 3+ Cortical cataract   Vitreous Mild Vitreous syneresis Mild Vitreous syneresis       Fundus Exam      Right Left   Disc Mild pallor, Sharp rim, +cupping, temporal PPA Sharp rim, mild pallor, +cupping, mild temporal PPA   C/D Ratio 0.6 0.6   Macula Flat, Blunted foveal reflex, mild MA/exudate and cystic changes temporal macula Flat, Blunted foveal reflex, mild RPE mottling, mild MA/exudate temporal macula   Vessels Vascular attenuation, Tortuous, mild AV crossing changes Vascular attenuation, mild Tortuousity, Copper wiring   Periphery Attached, scattered IRH/exudate, scattered RPE changes   Attached, scattered IRH/exudate, scattered RPE changes, 360 PRP--light pigment changes          Refraction    Manifest Refraction      Sphere Cylinder Axis Dist VA   Right +0.75 +0.25 090 20/30   Left -0.50 +1.50 090 20/40  IMAGING AND PROCEDURES  Imaging and Procedures for _0 @  OCT, Retina - OU - Both Eyes       Right Eye Quality was good. Central Foveal Thickness: 230. Progression has worsened. Findings include no SRF, intraretinal fluid, intraretinal hyper-reflective material, abnormal foveal contour (Cluster of IRF/IRHM temporal macula--stable to slightly increased; new area of IRF nasal fovea).   Left Eye Quality was good. Central Foveal Thickness: 251. Progression has been stable. Findings include abnormal foveal contour, vitreous traction, intraretinal fluid, intraretinal hyper-reflective material, no SRF (Persistent IRF and IRHM temporal macula, non-central, mild VMT--stable from prior).   Notes *Images captured and stored on drive  Diagnosis / Impression:  Non-central DME OU OD: Cluster of IRF/IRHM temporal macula--stable to slightly increased; new area of IRF nasal fovea OS: Persistent IRF and IRHM temporal macula, non-central, mild VMT--stable from  prior   Clinical management:  See below  Abbreviations: NFP - Normal foveal profile. CME - cystoid macular edema. PED - pigment epithelial detachment. IRF - intraretinal fluid. SRF - subretinal fluid. EZ - ellipsoid zone. ERM - epiretinal membrane. ORA - outer retinal atrophy. ORT - outer retinal tubulation. SRHM - subretinal hyper-reflective material        Intravitreal Injection, Pharmacologic Agent - OD - Right Eye       Time Out 06/23/2019. 2:12 PM. Confirmed correct patient, procedure, site, and patient consented.   Anesthesia Topical anesthesia was used. Anesthetic medications included Lidocaine 2%, Proparacaine 0.5%.   Procedure Preparation included 5% betadine to ocular surface, eyelid speculum. A supplied needle was used.   Injection:  1.25 mg Bevacizumab (AVASTIN) SOLN   NDC: 42595-638-75, Lot: [id number] , Expiration date: 08/12/2019   Route: Intravitreal, Site: Right Eye, Waste: 0 mL  Post-op Post injection exam found visual acuity of at least counting fingers. The patient tolerated the procedure well. There were no complications. The patient received written and verbal post procedure care education.                 ASSESSMENT/PLAN:    ICD-10-CM   1. 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 Retinal edema  H35.81 OCT, Retina - OU - Both Eyes  4. Vitreomacular traction, left  H43.822   5. Essential hypertension  I10   6. Hypertensive retinopathy of both eyes  H35.033   7. Pseudophakia  Z96.1   8. Combined forms of age-related cataract of left eye  H25.812   9. Glaucoma suspect of both eyes  H40.003   10. 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 PDR w/ macular edema OS        Severe nonproliferative diabetic retinopathy w/ DME OD - The incidence, risk factors for progression, natural history and treatment options for diabetic retinopathy  were discussed with patient.   - The need for close monitoring of blood glucose, blood pressure, and serum lipids, avoiding cigarette or any type of tobacco, and the need for long term follow up was also discussed with patient. - exam shows scattered IRH/DBH OU - FA shows capillary nonperfusion OU; late leaking MA OU; +NVE inf nasal quads OS only (OD no NV) - OCT shows Cluster of IRF/IRHM temporal macula OU--stable to slightly improved  OD, stable from prior OS - recommend today, 08.25.2020 - pt wishes to proceed - RBA of procedure discussed, questions answered - informed consent obtained and signed - see procedure note - s/p PRP OS (06.30.20) -- good peripheral laser in place - no PRP OD today due to corneal edema - patient is s/p CE w/ IOL OD--POV w/ Dr. Katy Webb on 08.04.20 - f/u 4 weeks --   4. VMT OS - mild traction centrally with minimal distortion of fovea - no IRF/SRF  5,6. Hypertensive retinopathy OU  - discussed importance of tight BP control  - monitor  7. Pseudophakia OD  - s/p CE/IOL OD 7.23.20 w/ expert surgeon, Dr. Midge Aver  - IOL in excellent position, mild corneal edema OD  - post op drops per Dr. Katy Webb  - schedule for POV on 8.4.2020  8. 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  - discussed diagnosis and progression  - pt reports significant glare symptoms  - under the expert management of Dr. Midge Aver  9. Glaucoma Suspect  - IOP 14 OU today  - significant cupping and pallor of disc OU  - under the expert management of Dr. Midge Aver   Ophthalmic Meds Ordered this visit:  Meds ordered this encounter  Medications  . Bevacizumab (AVASTIN) SOLN 1.25 mg        Return in about 4 weeks (around 07/21/2019) for f/u PDR OS, DFE, OCT.  There are no Patient Instructions on file for this visit.   Explained the diagnoses, plan, and follow up with the patient and they expressed understanding.  Patient expressed understanding of the importance of proper follow up care.   This document serves as a record of services personally performed by Gardiner Sleeper, MD, PhD. It was created on their behalf by Ernest Mallick, OA, an ophthalmic assistant. The creation of this record is the provider's dictation and/or activities during the visit.    Electronically signed by: Ernest Mallick, OA  08.24.2020 11:59 PM      Gardiner Sleeper, M.D., Ph.D. Diseases & Surgery of the Retina and Vitreous [facility]: M myopia (nearsighted); A astigmatism; H hyperopia (farsighted); P presbyopia; Mrx spectacle prescription;  CTL contact lenses; OD right eye; OS left eye; OU both eyes  XT exotropia; ET esotropia; PEK punctate epithelial keratitis; PEE punctate epithelial erosions; DES dry eye syndrome; MGD meibomian gland dysfunction; ATs artificial tears; PFAT's preservative free artificial tears; [place] nuclear sclerotic cataract; PSC posterior subcapsular cataract; ERM epi-retinal membrane; PVD posterior vitreous detachment; RD retinal detachment; DM diabetes mellitus; DR diabetic retinopathy; NPDR non-proliferative diabetic retinopathy; PDR proliferative diabetic retinopathy; CSME clinically significant macular edema; DME diabetic macular edema; dbh dot blot hemorrhages; CWS cotton wool spot; POAG primary open angle glaucoma; C/D cup-to-disc ratio; HVF humphrey visual field; GVF goldmann visual field; OCT optical coherence tomography; IOP intraocular pressure; BRVO Branch retinal vein occlusion; CRVO central retinal vein occlusion; CRAO central retinal artery occlusion; BRAO branch retinal artery occlusion; RT retinal tear; SB scleral buckle;  PPV pars plana vitrectomy; VH Vitreous hemorrhage; PRP panretinal laser photocoagulation; IVK intravitreal kenalog; VMT vitreomacular traction; MH Macular hole;  NVD neovascularization of the disc; NVE neovascularization elsewhere; AREDS age related eye disease study; ARMD age related macular degeneration; POAG primary open angle glaucoma; EBMD epithelial/anterior basement membrane dystrophy; ACIOL anterior chamber intraocular lens; IOL intraocular lens; PCIOL posterior  chamber intraocular lens; Phaco/IOL phacoemulsification with intraocular lens placement; [place] photorefractive keratectomy; LASIK laser assisted in situ keratomileusis; HTN hypertension; DM diabetes mellitus; COPD chronic obstructive pulmonary disease

## 2019-06-23 ENCOUNTER — Encounter (INDEPENDENT_AMBULATORY_CARE_PROVIDER_SITE_OTHER): Payer: Self-pay | Admitting: Ophthalmology

## 2019-06-23 ENCOUNTER — Ambulatory Visit (INDEPENDENT_AMBULATORY_CARE_PROVIDER_SITE_OTHER): Payer: BC Managed Care – PPO | Admitting: Ophthalmology

## 2019-06-23 DIAGNOSIS — H43822 Vitreomacular adhesion, left eye: Secondary | ICD-10-CM

## 2019-06-23 DIAGNOSIS — E113512 Type 2 diabetes mellitus with proliferative diabetic retinopathy with macular edema, left eye: Secondary | ICD-10-CM

## 2019-06-23 DIAGNOSIS — E113411 Type 2 diabetes mellitus with severe nonproliferative diabetic retinopathy with macular edema, right eye: Secondary | ICD-10-CM | POA: Diagnosis not present

## 2019-06-23 DIAGNOSIS — H40003 Preglaucoma, unspecified, bilateral: Secondary | ICD-10-CM

## 2019-06-23 DIAGNOSIS — H1851 Endothelial corneal dystrophy: Secondary | ICD-10-CM

## 2019-06-23 DIAGNOSIS — H18519 Endothelial corneal dystrophy, unspecified eye: Secondary | ICD-10-CM

## 2019-06-23 DIAGNOSIS — I10 Essential (primary) hypertension: Secondary | ICD-10-CM

## 2019-06-23 DIAGNOSIS — H35033 Hypertensive retinopathy, bilateral: Secondary | ICD-10-CM

## 2019-06-23 DIAGNOSIS — H3581 Retinal edema: Secondary | ICD-10-CM | POA: Diagnosis not present

## 2019-06-23 DIAGNOSIS — Z961 Presence of intraocular lens: Secondary | ICD-10-CM

## 2019-06-23 DIAGNOSIS — H25812 Combined forms of age-related cataract, left eye: Secondary | ICD-10-CM

## 2019-06-23 MED ORDER — BEVACIZUMAB CHEMO INJECTION 1.25MG/0.05ML SYRINGE FOR KALEIDOSCOPE
1.2500 mg | INTRAVITREAL | Status: AC | PRN
  Administered 2019-06-23: 1.25 mg via INTRAVITREAL

## 2019-06-24 ENCOUNTER — Ambulatory Visit (INDEPENDENT_AMBULATORY_CARE_PROVIDER_SITE_OTHER): Payer: BC Managed Care – PPO | Admitting: Endocrinology

## 2019-06-24 ENCOUNTER — Encounter: Payer: Self-pay | Admitting: Endocrinology

## 2019-06-24 ENCOUNTER — Other Ambulatory Visit: Payer: Self-pay

## 2019-06-24 VITALS — BP 138/60 | HR 74 | Ht 75.0 in | Wt 218.2 lb

## 2019-06-24 DIAGNOSIS — E1122 Type 2 diabetes mellitus with diabetic chronic kidney disease: Secondary | ICD-10-CM | POA: Diagnosis not present

## 2019-06-24 DIAGNOSIS — Z794 Long term (current) use of insulin: Secondary | ICD-10-CM | POA: Diagnosis not present

## 2019-06-24 DIAGNOSIS — N183 Chronic kidney disease, stage 3 unspecified: Secondary | ICD-10-CM

## 2019-06-24 LAB — POCT GLYCOSYLATED HEMOGLOBIN (HGB A1C): Hemoglobin A1C: 8.5 % — AB (ref 4.0–5.6)

## 2019-06-24 NOTE — Patient Instructions (Addendum)
Please continue the same Novolog.  Please stop taking the repaglinide. Also, I have sent a prescription to your pharmacy, for the [facility] continuous glucose monitor.  check your blood sugar once a day.  vary the time of day when you check, between before the 3 meals, and at bedtime.  also check if you have symptoms of your blood sugar being too high or too low.  please keep a record of the readings and bring it to your next appointment here (or you can bring the meter itself).  You can write it on any piece of paper.  please call us sooner if your blood sugar goes below 70, or if you have a lot of readings over 200. Please come back for a follow-up appointment in 1 month.
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
## 2019-07-07 ENCOUNTER — Telehealth: Payer: BC Managed Care – PPO | Admitting: Family Medicine

## 2019-07-07 ENCOUNTER — Ambulatory Visit (INDEPENDENT_AMBULATORY_CARE_PROVIDER_SITE_OTHER): Payer: Self-pay | Admitting: Physician Assistant

## 2019-07-07 ENCOUNTER — Other Ambulatory Visit: Payer: Self-pay

## 2019-07-07 VITALS — BP 156/73 | HR 79 | Temp 97.9°F | Resp 14 | Ht 75.0 in | Wt 220.0 lb

## 2019-07-07 DIAGNOSIS — N185 Chronic kidney disease, stage 5: Secondary | ICD-10-CM

## 2019-07-07 NOTE — Progress Notes (Signed)
POST OPERATIVE OFFICE NOTE    CC:  F/u for surgery  HPI:  This is a 68 y.o. male who is s/p first stage stage basilic av fistula creation on 06/08/2019.    He was last seen on 06/19/2019 for edema and drainage from the incision site.  He is here today for f/u evaluation  of the fistula.  He denise pain, loss of motor or sensation.  He denise drainage and fever.  No new complaints.    No Known Allergies  Current Outpatient Medications  Medication Sig Dispense Refill  . acetaminophen (TYLENOL) 500 MG tablet Take 2 tablets (1,000 mg total) by mouth 2 (two) times daily as needed for moderate pain. 30 tablet 0  . amLODipine (NORVASC) 10 MG tablet Take 1 tablet (10 mg total) by mouth daily. 90 tablet 3  . ASPIRIN LOW DOSE 81 MG EC tablet TAKE 1 TABLET BY MOUTH EVERY DAY (Patient taking differently: Take 81 mg by mouth daily. ) 90 tablet 3  . Blood Glucose Monitoring Suppl (ACCU-CHEK GUIDE ME) w/Device KIT 1 Device by Does not apply route 4 (four) times daily -  before meals and at bedtime. 1 kit 0  . carvedilol (COREG) 12.5 MG tablet TAKE 1 TABLET (12.5 MG TOTAL) BY MOUTH 2 (TWO) TIMES DAILY. 180 tablet 1  . cholecalciferol (VITAMIN D) 25 MCG (1000 UT) tablet Take 1,000 Units by mouth daily.     . Cyanocobalamin 2500 MCG CHEW Chew 2,500 mcg by mouth daily.    . feeding supplement, GLUCERNA SHAKE, (GLUCERNA SHAKE) LIQD Take 237 mLs by mouth 3 (three) times daily between meals.  0  . furosemide (LASIX) 20 MG tablet TAKE 2 TABLETS BY MOUTH IN THE MORNING AND ADDITIONAL 1 TABLET IN AFTERNOON,IF PERSISTENT LEG EDEMA (Patient taking differently: Take 20-40 mg by mouth See admin instructions. Take 40 mg by mouth in the morning, may take an additional 20 mg in the afternoon for persistent edema) 90 tablet 0  . gabapentin (NEURONTIN) 600 MG tablet TAKE 2 TABLETS BY MOUTH 3 TIMES A DAY (Patient taking differently: Take 600 mg by mouth 3 (three) times daily. ) 180 tablet 3  . insulin aspart (NOVOLOG FLEXPEN)  100 UNIT/ML FlexPen Inject 3 Units into the skin 3 (three) times daily with meals. 15 mL 11  . linaclotide (LINZESS) 145 MCG CAPS capsule Take 1 capsule (145 mcg total) by mouth daily as needed (constipation). (Patient taking differently: Take 145 mcg by mouth daily before breakfast. ) 30 capsule 0  . losartan (COZAAR) 25 MG tablet Take 25 mg by mouth daily.    . methocarbamol (ROBAXIN) 500 MG tablet TAKE 1 TABLET BY MOUTH TWICE DAILY AS NEEDED FOR MUSCLE SPASM (Patient taking differently: Take 500 mg by mouth 2 (two) times daily as needed for muscle spasms. ) 120 tablet 1  . montelukast (SINGULAIR) 10 MG tablet Take 1 tablet (10 mg total) by mouth at bedtime. 90 tablet 3  . omeprazole (PRILOSEC) 20 MG capsule Take 20 mg by mouth every evening.     . rosuvastatin (CRESTOR) 20 MG tablet TAKE 1 TABLET (20 MG TOTAL) BY MOUTH DAILY AT 6 PM. (Patient taking differently: Take 20 mg by mouth every evening. ) 90 tablet 1  . tadalafil (ADCIRCA/CIALIS) 20 MG tablet Take 1 tablet (20 mg total) by mouth daily as needed for erectile dysfunction. 10 tablet 0  . Vitamin D, Ergocalciferol, (DRISDOL) 1.25 MG (50000 UT) CAPS capsule Take 50,000 Units by mouth every Tuesday.    Marland Kitchen  vitamin E (VITAMIN E) 1000 UNIT capsule Take 1,000 Units by mouth daily.      No current facility-administered medications for this visit.      ROS:  See HPI  Physical Exam:   Incision:  Healed incisions, pitting edema at anastomosis incision. Extremities:  Left UE sensation and motor intact.  Full range of motion intact. Palpable thrill in fistula.    Assessment/Plan:  This is a 68 y.o. male who is s/p: S/P first stage basilic av fistula creation.  He has a well healed incision at this point and is basically asymptomatic.  He will f/u for fistula duplex in 1-2 weeks.  He is not currently on HD, so if he needs more healing time before we move to the second stage basilic transposition we have time.     Roxy Horseman PA-C  [facility] [phone]  Clinic MD:  Early

## 2019-07-08 ENCOUNTER — Ambulatory Visit: Payer: BC Managed Care – PPO | Admitting: Endocrinology

## 2019-07-09 ENCOUNTER — Encounter: Payer: Self-pay | Admitting: Cardiology

## 2019-07-09 ENCOUNTER — Other Ambulatory Visit: Payer: Self-pay

## 2019-07-09 ENCOUNTER — Ambulatory Visit (INDEPENDENT_AMBULATORY_CARE_PROVIDER_SITE_OTHER): Payer: BC Managed Care – PPO | Admitting: Cardiology

## 2019-07-09 VITALS — BP 177/76 | HR 79 | Temp 97.5°F | Ht 75.0 in | Wt 223.0 lb

## 2019-07-09 DIAGNOSIS — I4819 Other persistent atrial fibrillation: Secondary | ICD-10-CM

## 2019-07-09 DIAGNOSIS — I10 Essential (primary) hypertension: Secondary | ICD-10-CM | POA: Diagnosis not present

## 2019-07-09 DIAGNOSIS — Z8719 Personal history of other diseases of the digestive system: Secondary | ICD-10-CM

## 2019-07-09 DIAGNOSIS — I2510 Atherosclerotic heart disease of native coronary artery without angina pectoris: Secondary | ICD-10-CM

## 2019-07-09 MED ORDER — APIXABAN 5 MG PO TABS: 5.0000 mg | ORAL_TABLET | Freq: Two times a day (BID) | ORAL | 2 refills | Status: DC

## 2019-07-09 MED ORDER — CARVEDILOL 25 MG PO TABS: 25.0000 mg | ORAL_TABLET | Freq: Two times a day (BID) | ORAL | 1 refills | Status: DC
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
## 2019-07-10 ENCOUNTER — Other Ambulatory Visit: Payer: Self-pay

## 2019-07-10 ENCOUNTER — Encounter: Payer: Self-pay | Admitting: Cardiology

## 2019-07-10 DIAGNOSIS — Z8719 Personal history of other diseases of the digestive system: Secondary | ICD-10-CM | POA: Insufficient documentation

## 2019-07-10 DIAGNOSIS — I4819 Other persistent atrial fibrillation: Secondary | ICD-10-CM

## 2019-07-10 DIAGNOSIS — N185 Chronic kidney disease, stage 5: Secondary | ICD-10-CM

## 2019-07-10 HISTORY — DX: Other persistent atrial fibrillation: I48.19

## 2019-07-12 ENCOUNTER — Other Ambulatory Visit: Payer: Self-pay | Admitting: Family Medicine

## 2019-07-13 NOTE — Progress Notes (Deleted)
POST OPERATIVE OFFICE NOTE    CC:  F/u for surgery  HPI:  This is a 68 y.o. 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 Oneida Alar.  The pt does *** have evidence of steal.    No Known Allergies  Current Outpatient Medications  Medication Sig Dispense Refill  . acetaminophen (TYLENOL) 500 MG tablet Take 2 tablets (1,000 mg total) by mouth 2 (two) times daily as needed for moderate pain. 30 tablet 0  . amLODipine (NORVASC) 10 MG tablet Take 1 tablet (10 mg total) by mouth daily. 90 tablet 3  . apixaban (ELIQUIS) 5 MG TABS tablet Take 1 tablet (5 mg total) by mouth 2 (two) times daily. 60 tablet 2  . Blood Glucose Monitoring Suppl (ACCU-CHEK GUIDE ME) w/Device KIT 1 Device by Does not apply route 4 (four) times daily -  before meals and at bedtime. 1 kit 0  . carvedilol (COREG) 25 MG tablet Take 1 tablet (25 mg total) by mouth 2 (two) times daily. 180 tablet 1  . cholecalciferol (VITAMIN D) 25 MCG (1000 UT) tablet Take 1,000 Units by mouth daily.     . Cyanocobalamin 2500 MCG CHEW Chew 2,500 mcg by mouth daily.    . feeding supplement, GLUCERNA SHAKE, (GLUCERNA SHAKE) LIQD Take 237 mLs by mouth 3 (three) times daily between meals.  0  . furosemide (LASIX) 20 MG tablet Take 20 mg by mouth 2 (two) times daily.    Marland Kitchen gabapentin (NEURONTIN) 600 MG tablet TAKE 2 TABLETS BY MOUTH 3 TIMES A DAY (Patient taking differently: Take 600 mg by mouth 3 (three) times daily. ) 180 tablet 3  . insulin aspart (NOVOLOG FLEXPEN) 100 UNIT/ML FlexPen Inject 3 Units into the skin 3 (three) times daily with meals. (Patient taking differently: Inject 3 Units into the skin as needed. ) 15 mL 11  . linaclotide (LINZESS) 145 MCG CAPS capsule Take 1 capsule (145 mcg total) by mouth daily as needed (constipation). (Patient taking differently: Take 145 mcg by mouth daily before breakfast. ) 30 capsule 0  . losartan (COZAAR) 25 MG tablet Take 25 mg by mouth daily.    . methocarbamol (ROBAXIN) 500 MG tablet TAKE 1  TABLET BY MOUTH TWICE DAILY AS NEEDED FOR MUSCLE SPASM (Patient taking differently: Take 500 mg by mouth 2 (two) times daily as needed for muscle spasms. ) 120 tablet 1  . montelukast (SINGULAIR) 10 MG tablet Take 1 tablet (10 mg total) by mouth at bedtime. 90 tablet 3  . omeprazole (PRILOSEC) 20 MG capsule Take 20 mg by mouth every evening.     . rosuvastatin (CRESTOR) 20 MG tablet TAKE 1 TABLET (20 MG TOTAL) BY MOUTH DAILY AT 6 PM. (Patient taking differently: Take 20 mg by mouth every evening. ) 90 tablet 1  . tadalafil (ADCIRCA/CIALIS) 20 MG tablet Take 1 tablet (20 mg total) by mouth daily as needed for erectile dysfunction. 10 tablet 0  . Vitamin D, Ergocalciferol, (DRISDOL) 1.25 MG (50000 UT) CAPS capsule Take 50,000 Units by mouth every Tuesday.    . vitamin E (VITAMIN E) 1000 UNIT capsule Take 1,000 Units by mouth daily.      No current facility-administered medications for this visit.      ROS:  See HPI  Physical Exam:  ***  Incision:  *** Extremities:  *** Neuro: *** Abdomen:  ***  Dialysis duplex 07/14/2019: ***  Assessment/Plan:  This is a 68 y.o. male who is s/p: 1st stage left  BVT by Dr. Oneida Alar on 06/08/2019 here for follow up.  -***   Leontine Locket, PA-C [facility] [phone]  Clinic MD:  Scot Dock (on call MD)

## 2019-07-14 ENCOUNTER — Inpatient Hospital Stay (HOSPITAL_COMMUNITY): Admission: RE | Admit: 2019-07-14 | Payer: BC Managed Care – PPO | Source: Ambulatory Visit

## 2019-07-15 ENCOUNTER — Telehealth: Payer: Self-pay

## 2019-07-15 NOTE — Telephone Encounter (Signed)
Patient scheduled for EGD/Colon in the [facility] on 08/05/19 at 11:00am. Pre-visit scheduled on 07/28/19 patient to arrive at 2:15pm. Email sent to Cardiologist (Dr. Robbi Garter clearance to hold Eliquis for 4 days before procedure. Patient in agreement of the above appts.

## 2019-07-16 ENCOUNTER — Telehealth: Payer: Self-pay

## 2019-07-16 NOTE — Telephone Encounter (Signed)
-----   Message from Yetta Flock, MD sent at 07/16/2019  7:55 AM EDT ----- Sherlynn Stalls with the patient's GFR, his Eliquis needs to be held for 4 days at least or we can't do any polypectomy, etc, if anything is found. If he is not cleared to hold the Eliquis for 4 days, not sure if we can proceed at this time, would need to see the patient back in clinic and postpone his procedures.   Can you clarify with Dr. Virgina Jock, we need 4 days of it being held or we can't proceed. Thanks  ----- Message ----- From: Hughie Closs, RN Sent: 07/15/2019   4:28 PM EDT To: Yetta Flock, MD  Please see below ----- Message ----- From: Nigel Mormon, MD Sent: 07/15/2019   2:21 PM EDT To: Hughie Closs, RN  Preferably, recommend holding eliquis 2 days before and resume when safe.   Regards, Dr. Virgina Jock ----- Message ----- From: Hughie Closs, RN Sent: 07/15/2019   1:45 PM EDT To: Nigel Mormon, MD  Dear Dr. Virgina Jock:  Patient has an EGD/Colonoscopy scheduled with [facility] on 08/05/19  and Dr. Havery Moros is requesting clearance for patient to hold his Eliquis for 4 days before. Please advise  Thank-you, Dr. Doyne Keel office [phone]

## 2019-07-16 NOTE — Telephone Encounter (Signed)
Left message for Dr. Bonney Roussel nurse to please call me back to discuss possible hold of Eliquis for 4 days instead of his recommended 2 days (per Dr. Havery Moros)

## 2019-07-17 ENCOUNTER — Telehealth: Payer: Self-pay

## 2019-07-17 NOTE — Telephone Encounter (Signed)
EGD/Colon cancelled secondary to unable to get approval to hold Eliquis 4 days. Office visit scheduled with Dr. Havery Moros to reassess plan. Patient notified. Message to Dr. Bonney Roussel office that we would not be able to do patient's EGD/Colonoscopy right now.

## 2019-07-20 ENCOUNTER — Other Ambulatory Visit (HOSPITAL_COMMUNITY): Payer: Self-pay | Admitting: Nephrology

## 2019-07-20 ENCOUNTER — Telehealth: Payer: Self-pay

## 2019-07-20 DIAGNOSIS — R809 Proteinuria, unspecified: Secondary | ICD-10-CM

## 2019-07-20 DIAGNOSIS — R319 Hematuria, unspecified: Secondary | ICD-10-CM

## 2019-07-20 NOTE — Telephone Encounter (Signed)
See phone note

## 2019-07-20 NOTE — Progress Notes (Signed)
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 RPE changes, 360 PRP -- light pigment changes            IMAGING AND PROCEDURES  Imaging and Procedures for @TODAY @  OCT, Retina - OU - Both Eyes       Right Eye Quality was good. Central Foveal Thickness: 261. Progression has improved. Findings include no SRF, intraretinal fluid, intraretinal hyper-reflective material, abnormal foveal contour (Mild interval improvement in IRF temporal macula).   Left Eye Quality was good. Central Foveal Thickness: 258. Progression has been stable. Findings include abnormal foveal contour, vitreous traction, intraretinal fluid, intraretinal hyper-reflective material, no SRF (Mild cystic changes temporal macula).   Notes *Images captured and stored on drive  Diagnosis / Impression:  Non-central DME OU OD: Mild interval improvement  in IRF temporal macula OS: Mild cystic changes temporal macula   Clinical management:  See below  Abbreviations: NFP - Normal foveal profile. CME - cystoid macular edema. PED - pigment epithelial detachment. IRF - intraretinal fluid. SRF - subretinal fluid. EZ - ellipsoid zone. ERM - epiretinal membrane. ORA - outer retinal atrophy. ORT - outer retinal tubulation. SRHM - subretinal hyper-reflective material        Intravitreal Injection, Pharmacologic Agent - OD - Right Eye       Time Out 07/21/2019. 3:15 PM. Confirmed correct patient, procedure, site, and patient consented.   Anesthesia Topical anesthesia was used. Anesthetic medications included Lidocaine 2%, Proparacaine 0.5%.   Procedure Preparation included 5% betadine to ocular surface, eyelid speculum. A 30 gauge needle was used.   Injection:  1.25 mg Bevacizumab (AVASTIN) SOLN   NDC: 5009604-540-98Lot: [id number] , Expiration date: 10/15/2019  Route: Intravitreal, Site: Right Eye, Waste: 0 mL  Post-op Post injection exam found visual acuity of at least counting fingers. The patient tolerated the procedure well. There were no complications. The patient received written and verbal post procedure care education.                 ASSESSMENT/PLAN:    ICD-10-CM   1. 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 Retinal edema  H35.81 OCT, Retina - OU - Both Eyes  4. Vitreomacular traction, left  H43.822   5. Essential hypertension  I10   6. Hypertensive retinopathy of both eyes  H35.033   7. Pseudophakia  Z96.1   8. Combined forms of age-related cataract of left eye  H25.812   9. Glaucoma suspect  of both eyes  H40.003     1-3. PDR w/ macular edema OS        Severe nonproliferative diabetic retinopathy w/ DME OD - s/p PRP OS (06.30.20) -- light peripheral laser changes in place - s/p IVA OD #1 (08.25.20) - exam shows scattered IRH/DBH and exudates OU - FA shows capillary nonperfusion OU; late leaking MA OU; +NVE inf nasal quads OS only (OD no NV) - OCT shows Cluster of IRF/IRHM temporal macula OU--stable to slightly improved OD, stable from prior OS - recommend IVA OD #2 today, 09.22.2020 - pt wishes to proceed - RBA of procedure discussed, questions answered - informed consent obtained and signed - see procedure note - f/u 4 weeks -- DFE/OCT/possible injection OD +/- PRP OD  4. VMT OS - mild traction centrally with minimal distortion of fovea - no IRF/SRF  5,6. Hypertensive retinopathy OU  - discussed importance of tight BP control  - monitor  7. Pseudophakia OD  - s/p CE/IOL OD 7.23.20 w/ expert surgeon, Dr. Midge Aver  - IOL in excellent position, mild corneal edema improved  - post op drops per Dr. [name]  8. 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  - discussed diagnosis and progression  - pt reports significant glare symptoms  - under the expert management of Dr. Midge Aver  9. Glaucoma Suspect  - IOP 14 OD, 12 OS today  - significant cupping and pallor of disc OU  - under the expert management of Dr. Midge Aver   Ophthalmic Meds Ordered this visit:  Meds ordered this encounter  Medications  . Bevacizumab (AVASTIN) SOLN 1.25 mg       Return in about 4 weeks (around 08/18/2019) for f/u PDR OS, DFE, OCT.  There are no Patient Instructions on file for this visit.   Explained the diagnoses, plan, and follow up with the patient and they expressed understanding.  Patient expressed understanding of the importance of proper follow up care.   This document  serves as a record of services personally performed by Gardiner Sleeper, MD, PhD. It was created on their behalf by Ernest Mallick, OA, an ophthalmic assistant. The creation of this record is the provider's dictation and/or activities during the visit.    Electronically signed by: Ernest Mallick, OA  09.21.2020 4:51 PM  Gardiner Sleeper, M.D., Ph.D. Diseases & Surgery of the Retina and Vitreous [facility]  I have reviewed the above documentation for accuracy and completeness, and I agree with the above. Gardiner Sleeper, M.D., Ph.D. 07/21/19 4:51 PM    Abbreviations: M myopia (nearsighted); A astigmatism; H hyperopia (farsighted); P presbyopia; Mrx spectacle prescription;  CTL contact lenses; OD right eye; OS left eye; OU both eyes  XT exotropia; ET esotropia; PEK punctate epithelial keratitis; PEE punctate epithelial erosions; DES dry eye syndrome; MGD meibomian gland dysfunction; ATs artificial tears; PFAT's preservative free artificial tears; [place] nuclear sclerotic cataract; PSC posterior subcapsular cataract; ERM epi-retinal membrane; PVD posterior vitreous detachment; RD retinal detachment; DM diabetes mellitus; DR diabetic retinopathy; NPDR non-proliferative diabetic retinopathy; PDR proliferative diabetic retinopathy; CSME clinically significant macular edema; DME diabetic macular edema; dbh dot blot hemorrhages; CWS cotton wool spot; POAG primary open angle glaucoma; C/D cup-to-disc ratio; HVF humphrey visual field; GVF goldmann visual field; OCT optical coherence tomography; IOP intraocular pressure; BRVO Branch retinal vein occlusion; CRVO central retinal vein occlusion; CRAO central retinal artery occlusion; BRAO branch retinal artery occlusion; RT retinal tear; SB scleral buckle; PPV pars plana vitrectomy; VH Vitreous hemorrhage; PRP panretinal laser photocoagulation; IVK intravitreal kenalog; VMT vitreomacular traction; MH Macular hole;  NVD neovascularization of the  disc; NVE neovascularization elsewhere; AREDS age related eye disease study; ARMD age related macular degeneration; POAG primary open angle glaucoma; EBMD epithelial/anterior basement membrane dystrophy; ACIOL anterior chamber intraocular lens; IOL intraocular lens; PCIOL posterior chamber intraocular lens; Phaco/IOL phacoemulsification with intraocular lens placement; [place] photorefractive keratectomy; LASIK laser assisted in situ keratomileusis; HTN hypertension; DM diabetes mellitus; COPD chronic obstructive pulmonary disease

## 2019-07-20 NOTE — Telephone Encounter (Signed)
Aware. 

## 2019-07-20 NOTE — Telephone Encounter (Signed)
Telephone encounter:  Reason for call: radiology request for Eliquis to be stopped priot to kidney biospy, please be on the lookout for a staff message   Usual provider: MP  Last office visit: 07/09/19   Next office visit: 08/20/19 2  Last hospitalization:n/a   Current Outpatient Medications on File Prior to Visit  Medication Sig Dispense Refill  . acetaminophen (TYLENOL) 500 MG tablet Take 2 tablets (1,000 mg total) by mouth 2 (two) times daily as needed for moderate pain. 30 tablet 0  . amLODipine (NORVASC) 10 MG tablet Take 1 tablet (10 mg total) by mouth daily. 90 tablet 3  . apixaban (ELIQUIS) 5 MG TABS tablet Take 1 tablet (5 mg total) by mouth 2 (two) times daily. 60 tablet 2  . Blood Glucose Monitoring Suppl (ACCU-CHEK GUIDE ME) w/Device KIT 1 Device by Does not apply route 4 (four) times daily -  before meals and at bedtime. 1 kit 0  . carvedilol (COREG) 25 MG tablet Take 1 tablet (25 mg total) by mouth 2 (two) times daily. 180 tablet 1  . cholecalciferol (VITAMIN D) 25 MCG (1000 UT) tablet Take 1,000 Units by mouth daily.     . Cyanocobalamin 2500 MCG CHEW Chew 2,500 mcg by mouth daily.    . feeding supplement, GLUCERNA SHAKE, (GLUCERNA SHAKE) LIQD Take 237 mLs by mouth 3 (three) times daily between meals.  0  . furosemide (LASIX) 20 MG tablet Take 20 mg by mouth 2 (two) times daily.    Marland Kitchen gabapentin (NEURONTIN) 600 MG tablet TAKE 2 TABLETS BY MOUTH 3 TIMES A DAY (Patient taking differently: Take 600 mg by mouth 3 (three) times daily. ) 180 tablet 3  . insulin aspart (NOVOLOG FLEXPEN) 100 UNIT/ML FlexPen Inject 3 Units into the skin 3 (three) times daily with meals. (Patient taking differently: Inject 3 Units into the skin as needed. ) 15 mL 11  . linaclotide (LINZESS) 145 MCG CAPS capsule Take 1 capsule (145 mcg total) by mouth daily as needed (constipation). (Patient taking differently: Take 145 mcg by mouth daily before breakfast. ) 30 capsule 0  . losartan (COZAAR) 25 MG tablet  Take 25 mg by mouth daily.    . methocarbamol (ROBAXIN) 500 MG tablet TAKE 1 TABLET BY MOUTH TWICE DAILY AS NEEDED FOR MUSCLE SPASM (Patient taking differently: Take 500 mg by mouth 2 (two) times daily as needed for muscle spasms. ) 120 tablet 1  . montelukast (SINGULAIR) 10 MG tablet Take 1 tablet (10 mg total) by mouth at bedtime. 90 tablet 3  . omeprazole (PRILOSEC) 20 MG capsule Take 20 mg by mouth every evening.     . rosuvastatin (CRESTOR) 20 MG tablet TAKE 1 TABLET (20 MG TOTAL) BY MOUTH DAILY AT 6 PM. 90 tablet 1  . tadalafil (ADCIRCA/CIALIS) 20 MG tablet Take 1 tablet (20 mg total) by mouth daily as needed for erectile dysfunction. 10 tablet 0  . Vitamin D, Ergocalciferol, (DRISDOL) 1.25 MG (50000 UT) CAPS capsule Take 50,000 Units by mouth every Tuesday.    . vitamin E (VITAMIN E) 1000 UNIT capsule Take 1,000 Units by mouth daily.      No current facility-administered medications on file prior to visit.

## 2019-07-20 NOTE — Telephone Encounter (Signed)
-----   Message from Yetta Flock, MD sent at 07/16/2019  7:55 AM EDT ----- Sherlynn Stalls with the patient's GFR, his Eliquis needs to be held for 4 days at least or we can't do any polypectomy, etc, if anything is found. If he is not cleared to hold the Eliquis for 4 days, not sure if we can proceed at this time, would need to see the patient back in clinic and postpone his procedures.   Can you clarify with Dr. Virgina Jock, we need 4 days of it being held or we can't proceed. Thanks  ----- Message ----- From: Hughie Closs, RN Sent: 07/15/2019   4:28 PM EDT To: Yetta Flock, MD  Please see below ----- Message ----- From: Nigel Mormon, MD Sent: 07/15/2019   2:21 PM EDT To: Hughie Closs, RN  Preferably, recommend holding eliquis 2 days before and resume when safe.   Regards, Dr. Virgina Jock ----- Message ----- From: Hughie Closs, RN Sent: 07/15/2019   1:45 PM EDT To: Nigel Mormon, MD  Dear Dr. Virgina Jock:  Patient has an EGD/Colonoscopy scheduled with [facility] on 08/05/19  and Dr. Havery Moros is requesting clearance for patient to hold his Eliquis for 4 days before. Please advise  Thank-you, Dr. Doyne Keel office [phone]

## 2019-07-20 NOTE — Telephone Encounter (Signed)
-----   Message from Yetta Flock, MD sent at 07/16/2019 11:47 AM EDT ----- Unfortunately William Webb is cleared renally, with his GFR being so low, per ASGE guidelines on bleeding for endoscopy he needs to hold it for 4 days. Bleeding risk with polypectomy will be too high if he only holds it 2 days. If they cannot give clearance for that, we can't proceed. If that is the case we need to book him in clinic and please let him know we cannot proceed if we can't hold for 4 days. Thanks ----- Message ----- From: Hughie Closs, RN Sent: 07/16/2019  10:04 AM EDT To: Yetta Flock, MD  Magda Paganini (nurse from Dr. Bonney Roussel office) called back and said he will not give clearance for patient to stop his William Webb for more than 2 days, because he has not been on it long enough and he feels 2 days should be enough for It to clear his system. Please advise.  Sherlynn Stalls ----- Message ----- From: Yetta Flock, MD Sent: 07/16/2019   7:55 AM EDT To: Hughie Closs, RN  Anjelique Makar with the patient's GFR, his William Webb needs to be held for 4 days at least or we can't do any polypectomy, etc, if anything is found. If he is not cleared to hold the William Webb for 4 days, not sure if we can proceed at this time, would need to see the patient back in clinic and postpone his procedures.   Can you clarify with Dr. Virgina Jock, we need 4 days of it being held or we can't proceed. Thanks  ----- Message ----- From: Hughie Closs, RN Sent: 07/15/2019   4:28 PM EDT To: Yetta Flock, MD  Please see below ----- Message ----- From: Nigel Mormon, MD Sent: 07/15/2019   2:21 PM EDT To: Hughie Closs, RN  Preferably, recommend holding William Webb 2 days before and resume when safe.   Regards, Dr. Virgina Jock ----- Message ----- From: Hughie Closs, RN Sent: 07/15/2019   1:45 PM EDT To: Nigel Mormon, MD  Dear Dr. Virgina Jock:  Patient has an EGD/Colonoscopy scheduled with [facility] on 08/05/19  and Dr.  Havery Moros is requesting clearance for patient to hold his William Webb for 4 days before. Please advise  Thank-you, Dr. Doyne Keel office [phone]

## 2019-07-21 ENCOUNTER — Ambulatory Visit (INDEPENDENT_AMBULATORY_CARE_PROVIDER_SITE_OTHER): Payer: Medicare HMO | Admitting: Ophthalmology

## 2019-07-21 ENCOUNTER — Other Ambulatory Visit: Payer: Self-pay

## 2019-07-21 ENCOUNTER — Telehealth (HOSPITAL_COMMUNITY): Payer: Self-pay

## 2019-07-21 ENCOUNTER — Encounter (INDEPENDENT_AMBULATORY_CARE_PROVIDER_SITE_OTHER): Payer: Self-pay | Admitting: Ophthalmology

## 2019-07-21 ENCOUNTER — Telehealth: Payer: Self-pay

## 2019-07-21 DIAGNOSIS — Z961 Presence of intraocular lens: Secondary | ICD-10-CM

## 2019-07-21 DIAGNOSIS — E113512 Type 2 diabetes mellitus with proliferative diabetic retinopathy with macular edema, left eye: Secondary | ICD-10-CM | POA: Diagnosis not present

## 2019-07-21 DIAGNOSIS — H43822 Vitreomacular adhesion, left eye: Secondary | ICD-10-CM | POA: Diagnosis not present

## 2019-07-21 DIAGNOSIS — I10 Essential (primary) hypertension: Secondary | ICD-10-CM | POA: Diagnosis not present

## 2019-07-21 DIAGNOSIS — H25812 Combined forms of age-related cataract, left eye: Secondary | ICD-10-CM | POA: Diagnosis not present

## 2019-07-21 DIAGNOSIS — E113411 Type 2 diabetes mellitus with severe nonproliferative diabetic retinopathy with macular edema, right eye: Secondary | ICD-10-CM | POA: Diagnosis not present

## 2019-07-21 DIAGNOSIS — H3581 Retinal edema: Secondary | ICD-10-CM | POA: Diagnosis not present

## 2019-07-21 DIAGNOSIS — H40003 Preglaucoma, unspecified, bilateral: Secondary | ICD-10-CM | POA: Diagnosis not present

## 2019-07-21 DIAGNOSIS — H35033 Hypertensive retinopathy, bilateral: Secondary | ICD-10-CM

## 2019-07-21 MED ORDER — BEVACIZUMAB CHEMO INJECTION 1.25MG/0.05ML SYRINGE FOR KALEIDOSCOPE
1.2500 mg | INTRAVITREAL | Status: AC | PRN
  Administered 2019-07-21: 1.25 mg via INTRAVITREAL

## 2019-07-21 NOTE — Telephone Encounter (Signed)
Patient scheduled for EGD/Colon in [place] on 08/18/19 at 2:00pm. Pre-visit scheduled 08/11/19 at 4:00pm (patient to arrive at 3:45pm) patient notified to Hold Eliquis 4 days before.

## 2019-07-21 NOTE — Telephone Encounter (Signed)
Thanks

## 2019-07-21 NOTE — Telephone Encounter (Signed)
-----   Message from Yetta Flock, MD sent at 07/20/2019  5:38 PM EDT ----- Thanks Sherlynn Stalls. I understand he is ideally recommending 2 day hold but given his renal disease he needs to be off it for 4 days for his procedure. I think we should proceed for a 4 day hold based on the note provided. Thanks  ----- Message ----- From: Hughie Closs, RN Sent: 07/20/2019   2:37 PM EDT To: Yetta Flock, MD   ----- Message ----- From: Nigel Mormon, MD Sent: 07/17/2019   2:13 PM EDT To: Hughie Closs, RN  Recommendation, in general, is to hold Eliquis 2 days before any invasive procedure including colonoscopy.  I do not think this recommendation would necessarily change anytime later.  If EGD/colonoscopy is clinically indicated and Eliquis must be held for 4 days, I would suggest you proceed with the same at this time.  He probably has had A. fib for a while and I do not expect this to change in the near future.  Thanks MJP  ----- Message ----- From: Hughie Closs, RN Sent: 07/17/2019   1:28 PM EDT To: Nigel Mormon, MD  Dr. Virgina Jock, Thank-you for your review of our request to hold patient's Eliquis. Just wanted to keep you in the loop that we will put patient's EGD/Colonoscopy on hold for right now, secondary to not being able to hold the Eliquis for 4 days. We will check with you again in the future to see where patient is at in relation to his Eliquis. Thanks again, Dr. Doyne Keel office

## 2019-07-21 NOTE — Telephone Encounter (Signed)
-----   Message from Jillyn Hidden sent at 07/21/2019  1:28 PM EDT ----- Regarding: FW: US Biopsy  ----- Message ----- From: Nigel Mormon, MD Sent: 07/21/2019   1:12 PM EDT To: Jillyn Hidden Subject: RE: US Biopsy                                  Okay to proceed on 10/5.   [name] requested if Eliquis could be held longer, given scar tissue and risk for bleeding. While not optimal to hold longer than 2 days, we discused risks and benefits and agreed that eliquis could be held 5 days before the procedure.  Thanks MJP   ----- Message ----- From: Garth Bigness D Sent: 07/20/2019  10:38 AM EDT To: Nigel Mormon, MD Subject: US Biopsy                                      Dr. Virgina Jock, I have received an order from [name] at [facility] for Mr [place] to have a Kidney Biopsy done. I see that you just recently placed him on Eliquis on 07/09/2019. I just need to know if it is okay to hold his Eliquis for 2 days prior to his biopsy? Patient stated to me that you might want him to stay on his meds for a month before any major procedures that involve holding his Eliquis. If so that is fine to, right now I have patient scheduled for his Biopsy on 08/03/2019, pending the okay to hold his Eliquis or I can move his biopsy to after 08/08/2019 so that he is on his meds for at least a month before coming off for 2days. Please let me know how you feel about those two ideas and which one would work better for the patient and you.   Thanks Aniceto Boss

## 2019-07-27 ENCOUNTER — Other Ambulatory Visit: Payer: Self-pay

## 2019-07-29 ENCOUNTER — Encounter: Payer: Self-pay | Admitting: Endocrinology

## 2019-07-29 ENCOUNTER — Other Ambulatory Visit: Payer: Self-pay

## 2019-07-29 ENCOUNTER — Ambulatory Visit (INDEPENDENT_AMBULATORY_CARE_PROVIDER_SITE_OTHER): Payer: BC Managed Care – PPO | Admitting: Endocrinology

## 2019-07-29 VITALS — BP 138/72 | HR 75 | Ht 75.0 in | Wt 228.6 lb

## 2019-07-29 DIAGNOSIS — N183 Chronic kidney disease, stage 3 unspecified: Secondary | ICD-10-CM

## 2019-07-29 DIAGNOSIS — Z23 Encounter for immunization: Secondary | ICD-10-CM | POA: Diagnosis not present

## 2019-07-29 DIAGNOSIS — Z794 Long term (current) use of insulin: Secondary | ICD-10-CM | POA: Diagnosis not present

## 2019-07-29 DIAGNOSIS — E1122 Type 2 diabetes mellitus with diabetic chronic kidney disease: Secondary | ICD-10-CM | POA: Diagnosis not present

## 2019-07-29 MED ORDER — FREESTYLE LIBRE 14 DAY SENSOR MISC: 1.0000 | 3 refills | Status: DC

## 2019-07-29 MED ORDER — FREESTYLE LIBRE 14 DAY READER DEVI: 1.0000 | Freq: Once | 0 refills | Status: AC
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
## 2019-07-29 NOTE — Patient Instructions (Addendum)
A different type of diabetes blood test is requested for you today.  We'll let you know about the results.  Also, I have sent a prescription to your pharmacy, for the [facility] continuous glucose monitor.   check your blood sugar once a day.  vary the time of day when you check, between before the 3 meals, and at bedtime.  also check if you have symptoms of your blood sugar being too high or too low.  please keep a record of the readings and bring it to your next appointment here (or you can bring the meter itself).  You can write it on any piece of paper.  please call us sooner if your blood sugar goes below 70, or if you have a lot of readings over 200. Please see a foot specialist.  you will receive a phone call, about a day and time for an appointment.   Please come back for a follow-up appointment in 2 months

## 2019-07-30 ENCOUNTER — Encounter (HOSPITAL_COMMUNITY): Payer: Self-pay

## 2019-07-30 ENCOUNTER — Ambulatory Visit (HOSPITAL_COMMUNITY): Payer: BC Managed Care – PPO | Attending: Vascular Surgery

## 2019-07-30 ENCOUNTER — Other Ambulatory Visit: Payer: Self-pay | Admitting: Radiology

## 2019-07-31 ENCOUNTER — Other Ambulatory Visit: Payer: Self-pay | Admitting: Student

## 2019-07-31 ENCOUNTER — Telehealth: Payer: Self-pay | Admitting: *Deleted

## 2019-07-31 NOTE — Telephone Encounter (Signed)
William Webb  This patient has an  extensive complex history, including non stemi 09/2018. Using anticoagulants instead of therapeutics related to difficulty with cath.Patient is scheduled for egd/colon on 10/20 Please review and advise if patient is appropriate for anesthesia at [facility] Thank you as always Santiago Glad

## 2019-08-01 NOTE — Telephone Encounter (Signed)
William Webb,  This pt is not having anginal symptoms, he is cleared for anesthetic care at [facility].  Thanks,  Osvaldo Angst

## 2019-08-03 ENCOUNTER — Encounter (HOSPITAL_COMMUNITY): Payer: Self-pay

## 2019-08-03 ENCOUNTER — Ambulatory Visit (HOSPITAL_COMMUNITY)
Admission: RE | Admit: 2019-08-03 | Discharge: 2019-08-03 | Disposition: A | Discharge status: Home / Self Care | Payer: BC Managed Care – PPO | Source: Ambulatory Visit | Attending: Nephrology | Admitting: Nephrology

## 2019-08-03 ENCOUNTER — Other Ambulatory Visit: Payer: Self-pay

## 2019-08-03 ENCOUNTER — Other Ambulatory Visit: Payer: Self-pay | Admitting: Radiology

## 2019-08-03 DIAGNOSIS — Z794 Long term (current) use of insulin: Secondary | ICD-10-CM | POA: Diagnosis not present

## 2019-08-03 DIAGNOSIS — Z7901 Long term (current) use of anticoagulants: Secondary | ICD-10-CM | POA: Insufficient documentation

## 2019-08-03 DIAGNOSIS — R319 Hematuria, unspecified: Secondary | ICD-10-CM | POA: Diagnosis not present

## 2019-08-03 DIAGNOSIS — I2510 Atherosclerotic heart disease of native coronary artery without angina pectoris: Secondary | ICD-10-CM | POA: Diagnosis not present

## 2019-08-03 DIAGNOSIS — E11319 Type 2 diabetes mellitus with unspecified diabetic retinopathy without macular edema: Secondary | ICD-10-CM | POA: Insufficient documentation

## 2019-08-03 DIAGNOSIS — K219 Gastro-esophageal reflux disease without esophagitis: Secondary | ICD-10-CM | POA: Diagnosis not present

## 2019-08-03 DIAGNOSIS — E785 Hyperlipidemia, unspecified: Secondary | ICD-10-CM | POA: Diagnosis not present

## 2019-08-03 DIAGNOSIS — I130 Hypertensive heart and chronic kidney disease with heart failure and stage 1 through stage 4 chronic kidney disease, or unspecified chronic kidney disease: Secondary | ICD-10-CM | POA: Diagnosis not present

## 2019-08-03 DIAGNOSIS — M810 Age-related osteoporosis without current pathological fracture: Secondary | ICD-10-CM | POA: Insufficient documentation

## 2019-08-03 DIAGNOSIS — N183 Chronic kidney disease, stage 3 unspecified: Secondary | ICD-10-CM | POA: Insufficient documentation

## 2019-08-03 DIAGNOSIS — D649 Anemia, unspecified: Secondary | ICD-10-CM | POA: Diagnosis not present

## 2019-08-03 DIAGNOSIS — I509 Heart failure, unspecified: Secondary | ICD-10-CM | POA: Diagnosis not present

## 2019-08-03 DIAGNOSIS — R3129 Other microscopic hematuria: Secondary | ICD-10-CM | POA: Diagnosis not present

## 2019-08-03 DIAGNOSIS — I252 Old myocardial infarction: Secondary | ICD-10-CM | POA: Insufficient documentation

## 2019-08-03 DIAGNOSIS — M158 Other polyosteoarthritis: Secondary | ICD-10-CM | POA: Insufficient documentation

## 2019-08-03 DIAGNOSIS — Z79899 Other long term (current) drug therapy: Secondary | ICD-10-CM | POA: Diagnosis not present

## 2019-08-03 DIAGNOSIS — R809 Proteinuria, unspecified: Secondary | ICD-10-CM | POA: Diagnosis not present

## 2019-08-03 DIAGNOSIS — Z87891 Personal history of nicotine dependence: Secondary | ICD-10-CM | POA: Diagnosis not present

## 2019-08-03 DIAGNOSIS — E1122 Type 2 diabetes mellitus with diabetic chronic kidney disease: Secondary | ICD-10-CM | POA: Diagnosis not present

## 2019-08-03 DIAGNOSIS — E1142 Type 2 diabetes mellitus with diabetic polyneuropathy: Secondary | ICD-10-CM | POA: Diagnosis not present

## 2019-08-03 LAB — CBC
HCT: 31.6 % — ABNORMAL LOW (ref 39.0–52.0)
Hemoglobin: 9.9 g/dL — ABNORMAL LOW (ref 13.0–17.0)
MCH: 28.2 pg (ref 26.0–34.0)
MCHC: 31.3 g/dL (ref 30.0–36.0)
MCV: 90 fL (ref 80.0–100.0)
Platelets: 207 10*3/uL (ref 150–400)
RBC: 3.51 MIL/uL — ABNORMAL LOW (ref 4.22–5.81)
RDW: 13.2 % (ref 11.5–15.5)
WBC: 3.4 10*3/uL — ABNORMAL LOW (ref 4.0–10.5)
nRBC: 0 % (ref 0.0–0.2)

## 2019-08-03 LAB — GLUCOSE, CAPILLARY: Glucose-Capillary: 185 mg/dL — ABNORMAL HIGH (ref 70–99)

## 2019-08-03 LAB — PROTIME-INR
INR: 1.1 (ref 0.8–1.2)
Prothrombin Time: 13.8 seconds (ref 11.4–15.2)

## 2019-08-03 LAB — FRUCTOSAMINE: Fructosamine: 257 umol/L (ref 205–285)

## 2019-08-03 MED ORDER — FENTANYL CITRATE (PF) 100 MCG/2ML IJ SOLN
INTRAMUSCULAR | Status: AC
  Filled 2019-08-03: qty 2

## 2019-08-03 MED ORDER — MIDAZOLAM HCL 2 MG/2ML IJ SOLN
INTRAMUSCULAR | Status: AC
  Filled 2019-08-03: qty 2

## 2019-08-03 MED ORDER — GELATIN ABSORBABLE 12-7 MM EX MISC
CUTANEOUS | Status: AC
  Filled 2019-08-03: qty 1

## 2019-08-03 MED ORDER — MIDAZOLAM HCL 2 MG/2ML IJ SOLN
INTRAMUSCULAR | Status: AC | PRN
  Administered 2019-08-03: 0.5 mg via INTRAVENOUS
  Administered 2019-08-03: 1 mg via INTRAVENOUS
  Administered 2019-08-03: 0.5 mg via INTRAVENOUS

## 2019-08-03 MED ORDER — HYDRALAZINE HCL 20 MG/ML IJ SOLN
INTRAMUSCULAR | Status: AC | PRN
  Administered 2019-08-03: 10 mg via INTRAVENOUS

## 2019-08-03 MED ORDER — FENTANYL CITRATE (PF) 100 MCG/2ML IJ SOLN
INTRAMUSCULAR | Status: AC | PRN
  Administered 2019-08-03 (×2): 25 ug via INTRAVENOUS
  Administered 2019-08-03: 50 ug via INTRAVENOUS

## 2019-08-03 MED ORDER — SODIUM CHLORIDE 0.9 % IV SOLN: INTRAVENOUS | Status: DC

## 2019-08-03 MED ORDER — HYDRALAZINE HCL 20 MG/ML IJ SOLN
INTRAMUSCULAR | Status: AC
  Filled 2019-08-03: qty 1

## 2019-08-03 MED ORDER — HYDROCODONE-ACETAMINOPHEN 5-325 MG PO TABS: 1.0000 | ORAL_TABLET | ORAL | Status: DC | PRN

## 2019-08-03 MED ORDER — LIDOCAINE HCL (PF) 1 % IJ SOLN
INTRAMUSCULAR | Status: AC
  Filled 2019-08-03: qty 30

## 2019-08-03 NOTE — Discharge Instructions (Addendum)
Percutaneous Kidney Biopsy  A kidney biopsy is a procedure to remove small pieces of tissue from a kidney. In a percutaneous biopsy, the tissue is removed using a needle that is inserted through the skin. This procedure is done so that the tissue can be examined under a microscope and checked for disease or infection. Tell a health care provider about:  Any allergies you have.  All medicines you are taking, including vitamins, herbs, eye drops, creams, and over-the-counter medicines.  Any problems you or family members have had with anesthetic medicines.  Any blood disorders you have.  Any surgeries you have had.  Any medical conditions you have.  Whether you are pregnant or may be pregnant. What are the risks? Generally, this is a safe procedure. However, problems may occur, including:  Infection.  Bleeding.  Allergic reactions to medicines.  Damage to other structures or organs.  Swelling from a collection of clotted blood outside a blood vessel (hematoma).  Blood in the urine (hematuria). What happens before the procedure?  Follow instructions from your health care provider about eating or drinking restrictions.  Ask your health care provider about: ? Changing or stopping your regular medicines. This is especially important if you are taking diabetes medicines or blood thinners. ? Taking medicines such as aspirin and ibuprofen. These medicines can thin your blood. Do not take these medicines before your procedure if your health care provider instructs you not to.  You may be given antibiotic medicine to help prevent infection.  You will have blood and urine samples taken. This is to make sure that you do not have a condition where you should not have a biopsy.  Plan to have someone take you home from the hospital or clinic.  Ask your health care provider how your biopsy site will be marked or identified. What happens during the procedure?  To lower your risk of  infection: ? Your health care team will wash or sanitize their hands. ? Your skin will be washed with soap.  An IV tube will be inserted into one of your veins.  You will be given one or more of the following: ? A medicine to help you relax (sedative). ? A medicine to numb the area (local anesthetic).  You will lie on your abdomen. A firm pillow will be placed under your body to help push the kidneys closer to the surface of the skin. If you have a transplanted kidney, you will lie on your back.  The health care provider will mark the area where the needle will enter your skin.  An imaging test--such as an ultrasound, X-ray, CT scan, or MRI--will be used to locate the kidney. These images will also help the health care provider to guide the biopsy needle into the kidney.  You will be asked to hold your breath and stay still while the health care provider inserts the needle and removes the kidney tissue. ? You will need to hold your breath and stay still for 30-45 seconds. ? During the biopsy, you may hear a popping sound from the needle. ? You may also feel some pressure from the area where the needle is being inserted.  The needle may be inserted and removed 3 or 4 times to make sure that enough tissue is taken for testing.  A bandage (dressing) may be placed over the spot where the needle entered your skin (biopsy site). The procedure may vary among health care providers and hospitals. What happens after the procedure?  Your blood pressure, heart rate, breathing rate, and blood oxygen level will be monitored until the medicines you were given have worn off.  You will need to lie on your back for 6-8 hours.  You may have some pain or soreness near the biopsy site.  You may have pink or cloudy urine from small amounts of blood. This is normal.  You may have grogginess or fatigue if you were given a sedative.  Do not drive for 24 hours if you were given a sedative.  It is up to  you to get the results of your procedure. Ask your health care provider, or the department performing the procedure, when your results will be ready. This information is not intended to replace advice given to you by your health care provider. Make sure you discuss any questions you have with your health care provider. Document Released: 08/25/2004 Document Revised: 09/27/2017 Document Reviewed: 07/27/2016 Elsevier Patient Education  [place]. Moderate Conscious Sedation, Adult, Care After These instructions provide you with information about caring for yourself after your procedure. Your health care provider may also give you more specific instructions. Your treatment has been planned according to current medical practices, but problems sometimes occur. Call your health care provider if you have any problems or questions after your procedure. What can I expect after the procedure? After your procedure, it is common:  To feel sleepy for several hours.  To feel clumsy and have poor balance for several hours.  To have poor judgment for several hours.  To vomit if you eat too soon. Follow these instructions at home: For at least 24 hours after the procedure:   Do not: ? Participate in activities where you could fall or become injured. ? Drive. ? Use heavy machinery. ? Drink alcohol. ? Take sleeping pills or medicines that cause drowsiness. ? Make important decisions or sign legal documents. ? Take care of children on your own.  Rest. Eating and drinking  Follow the diet recommended by your health care provider.  If you vomit: ? Drink water, juice, or soup when you can drink without vomiting. ? Make sure you have little or no nausea before eating solid foods. General instructions  Have a responsible adult stay with you until you are awake and alert.  Take over-the-counter and prescription medicines only as told by your health care provider.  If you smoke, do not smoke  without supervision.  Keep all follow-up visits as told by your health care provider. This is important. Contact a health care provider if:  You keep feeling nauseous or you keep vomiting.  You feel light-headed.  You develop a rash.  You have a fever. Get help right away if:  You have trouble breathing. This information is not intended to replace advice given to you by your health care provider. Make sure you discuss any questions you have with your health care provider. Document Released: 08/05/2013 Document Revised: 09/27/2017 Document Reviewed: 02/04/2016 Elsevier Patient Education  2020 [employer].

## 2019-08-03 NOTE — Sedation Documentation (Signed)
Called to give report. Nurse unavailable. Will call back 

## 2019-08-03 NOTE — Progress Notes (Signed)
Attempted to call William Webb message left to call back.

## 2019-08-03 NOTE — Progress Notes (Signed)
Discharge instructions reviewed with pt and his wife (via telephone) both voice understanding.  

## 2019-08-03 NOTE — Procedures (Signed)
Interventional Radiology Procedure Note  Procedure: Random renal biopsy, RIGHT  Complications: None immediate  Estimated Blood Loss: None  Recommendations: - Bedrest x 4 hrs - DC home  Signed,  Criselda Peaches, MD
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
## 2019-08-03 NOTE — Progress Notes (Signed)
Ambulated to bathroom and hallway  tol well.

## 2019-08-05 ENCOUNTER — Encounter: Payer: BC Managed Care – PPO | Admitting: Gastroenterology

## 2019-08-11 ENCOUNTER — Ambulatory Visit (AMBULATORY_SURGERY_CENTER): Payer: Self-pay

## 2019-08-11 ENCOUNTER — Other Ambulatory Visit: Payer: Self-pay

## 2019-08-11 ENCOUNTER — Telehealth: Payer: Self-pay

## 2019-08-11 VITALS — Temp 96.6°F | Ht 75.0 in | Wt 218.0 lb

## 2019-08-11 DIAGNOSIS — D508 Other iron deficiency anemias: Secondary | ICD-10-CM

## 2019-08-11 MED ORDER — NA SULFATE-K SULFATE-MG SULF 17.5-3.13-1.6 GM/177ML PO SOLN: 1.0000 | Freq: Once | ORAL | 0 refills | Status: AC

## 2019-08-11 NOTE — Telephone Encounter (Signed)
Company: [employer]  Document: [facility] CGM Other records requested: None  All above requested information has been faxed successfully to [employer] listed above. Documents and fax confirmation have been placed in the faxed file for future reference.

## 2019-08-11 NOTE — Progress Notes (Signed)
Denies allergies to eggs or soy products. Denies complication of anesthesia or sedation. Denies use of weight loss medication. Denies use of O2.   Emmi instructions given for colonoscopy.  Patient is scheduled for his double procedure on 08/18/19. Patient declined Covid Testing.

## 2019-08-13 ENCOUNTER — Encounter (HOSPITAL_COMMUNITY): Payer: Self-pay | Admitting: Nephrology

## 2019-08-13 LAB — SURGICAL PATHOLOGY

## 2019-08-17 ENCOUNTER — Telehealth: Payer: Self-pay

## 2019-08-17 NOTE — Telephone Encounter (Signed)
Covid-19 screening questions   Do you now or have you had a fever in the last 14 days? NO   Do you have any respiratory symptoms of shortness of breath or cough now or in the last 14 days? NO  Do you have any family members or close contacts with diagnosed or suspected Covid-19 in the past 14 days? NO  Have you been tested for Covid-19 and found to be positive? NO        

## 2019-08-18 ENCOUNTER — Other Ambulatory Visit: Payer: Self-pay

## 2019-08-18 ENCOUNTER — Ambulatory Visit (AMBULATORY_SURGERY_CENTER): Payer: BC Managed Care – PPO | Admitting: Gastroenterology

## 2019-08-18 ENCOUNTER — Encounter: Payer: Self-pay | Admitting: Gastroenterology

## 2019-08-18 VITALS — BP 179/99 | HR 81 | Temp 98.7°F | Resp 15 | Ht 75.0 in | Wt 218.0 lb

## 2019-08-18 DIAGNOSIS — D509 Iron deficiency anemia, unspecified: Secondary | ICD-10-CM | POA: Diagnosis not present

## 2019-08-18 DIAGNOSIS — D649 Anemia, unspecified: Secondary | ICD-10-CM | POA: Diagnosis not present

## 2019-08-18 DIAGNOSIS — K922 Gastrointestinal hemorrhage, unspecified: Secondary | ICD-10-CM

## 2019-08-18 DIAGNOSIS — R1310 Dysphagia, unspecified: Secondary | ICD-10-CM

## 2019-08-18 DIAGNOSIS — N183 Chronic kidney disease, stage 3 unspecified: Secondary | ICD-10-CM | POA: Diagnosis not present

## 2019-08-18 DIAGNOSIS — K648 Other hemorrhoids: Secondary | ICD-10-CM | POA: Diagnosis not present

## 2019-08-18 DIAGNOSIS — I129 Hypertensive chronic kidney disease with stage 1 through stage 4 chronic kidney disease, or unspecified chronic kidney disease: Secondary | ICD-10-CM | POA: Diagnosis not present

## 2019-08-18 DIAGNOSIS — E1122 Type 2 diabetes mellitus with diabetic chronic kidney disease: Secondary | ICD-10-CM | POA: Diagnosis not present

## 2019-08-18 DIAGNOSIS — I2510 Atherosclerotic heart disease of native coronary artery without angina pectoris: Secondary | ICD-10-CM | POA: Diagnosis not present

## 2019-08-18 HISTORY — PX: ESOPHAGOGASTRODUODENOSCOPY ENDOSCOPY: SHX5814

## 2019-08-18 MED ORDER — SODIUM CHLORIDE 0.9 % IV SOLN: 500.0000 mL | Freq: Once | INTRAVENOUS | Status: DC

## 2019-08-18 NOTE — Progress Notes (Signed)
Called to room to assist during endoscopic procedure.  Patient ID and intended procedure confirmed with present staff. Received instructions for my participation in the procedure from the performing physician.  

## 2019-08-18 NOTE — Op Note (Signed)
[place] Patient Name: William Webb Procedure Date: 08/18/2019 2:16 PM MRN: [id number] Endoscopist: Remo Lipps P. Havery Moros , MD Age: 68 Referring MD:  Date of Birth: July 09, 1951 Gender: Male Account #: [id number] Procedure:                Upper GI endoscopy Indications:              history of acute on chronic anemia leading to blood                            tranfusion several months ago while on Brilinta,                            rule out GI tract blood loss, now on Eliquis,                            history of dysphagia - patient reports resolution                            of dysphagia, no symptoms currently. Medicines:                Monitored Anesthesia Care Procedure:                Pre-Anesthesia Assessment:                           - Prior to the procedure, a History and Physical                            was performed, and patient medications and                            allergies were reviewed. The patient's tolerance of                            previous anesthesia was also reviewed. The risks                            and benefits of the procedure and the sedation                            options and risks were discussed with the patient.                            All questions were answered, and informed consent                            was obtained. Prior Anticoagulants: The patient has                            taken Eliquis (apixaban), last dose was 4 days                            prior to procedure. ASA Grade Assessment: III - A  patient with severe systemic disease. After                            reviewing the risks and benefits, the patient was                            deemed in satisfactory condition to undergo the                            procedure.                           After obtaining informed consent, the endoscope was                            passed under direct vision. Throughout the                          procedure, the patient's blood pressure, pulse, and                            oxygen saturations were monitored continuously. The                            Endoscope was introduced through the mouth, and                            advanced to the second part of duodenum. The upper                            GI endoscopy was accomplished without difficulty.                            The patient tolerated the procedure well. Scope In: Scope Out: Findings:                 Esophagogastric landmarks were identified: the                            Z-line was found at 42 cm, the gastroesophageal                            junction was found at 42 cm and the upper extent of                            the gastric folds was found at 43 cm from the                            incisors. 1cm hiatal hernia.                           The exam of the esophagus was otherwise normal. No                            stricture /  stenosis appreciated.                           The entire examined stomach was normal.                           The ampulla was slightly prominent. I suspect                            normal variant, biopsies were taken with a cold                            forceps for histology to ensure no adenomatous                            changes.                           The exam of the duodenum was otherwise normal. Complications:            No immediate complications. Estimated blood loss:                            Minimal. Estimated Blood Loss:     Estimated blood loss was minimal. Impression:               - Esophagogastric landmarks identified.                           - 1cm hiatal hernia                           - Normal esophagus otherwise - no overt stenosis                           - Normal stomach.                           - Prominent ampulla, suspect normal varient.                            Biopsied.                           - Normal duodenum  otherwise                           No cause for anemia / GI tract blood loss on EGD.                            Colonoscopy negative. If worsening anemia with                            concern for overt blood loss, consider capsule                            endoscopy Recommendation:           -  Patient has a contact number available for                            emergencies. The signs and symptoms of potential                            delayed complications were discussed with the                            patient. Return to normal activities tomorrow.                            Written discharge instructions were provided to the                            patient.                           - Resume previous diet.                           - Continue present medications.                           - Resume Eliquis tonight                           - Await pathology results. Remo Lipps P. Armbruster, MD 08/18/2019 3:01:51 PM This report has been signed electronically.

## 2019-08-18 NOTE — Progress Notes (Signed)
[facility] Temp-June  Pt's states no medical or surgical changes since previsit or office visit.

## 2019-08-18 NOTE — Op Note (Signed)
William Webb Procedure Date: 08/18/2019 2:15 PM MRN: [id number] Endoscopist: Remo Lipps P. Havery Moros , MD Age: 68 Referring MD:  Date of Birth: 08/17/1951 Gender: Male Account #: [id number] Procedure:                Colonoscopy Indications:              anemia, history of possible gastrointestinal                            bleeding previously while on Brilinta, now on                            Eliquis - stable Hgb, no overt bleeding Medicines:                Monitored Anesthesia Care Procedure:                Pre-Anesthesia Assessment:                           - Prior to the procedure, a History and Physical                            was performed, and patient medications and                            allergies were reviewed. The patient's tolerance of                            previous anesthesia was also reviewed. The risks                            and benefits of the procedure and the sedation                            options and risks were discussed with the patient.                            All questions were answered, and informed consent                            was obtained. Prior Anticoagulants: The patient has                            taken Eliquis (apixaban), last dose was 4 days                            prior to procedure. ASA Grade Assessment: III - A                            patient with severe systemic disease. After                            reviewing the risks and benefits, the patient was  deemed in satisfactory condition to undergo the                            procedure.                           After obtaining informed consent, the colonoscope                            was passed under direct vision. Throughout the                            procedure, the patient's blood pressure, pulse, and                            oxygen saturations were monitored continuously. The                      Colonoscope was introduced through the anus and                            advanced to the the terminal ileum, with                            identification of the appendiceal orifice and IC                            valve. The colonoscopy was performed without                            difficulty. The patient tolerated the procedure                            well. The quality of the bowel preparation was                            good. The terminal ileum, ileocecal valve,                            appendiceal orifice, and rectum were photographed. Scope In: 2:32:26 PM Scope Out: 3:55:97 PM Scope Withdrawal Time: 0 hours 13 minutes 22 seconds  Total Procedure Duration: 0 hours 16 minutes 46 seconds  Findings:                 Hemorrhoids were found on perianal exam.                           The terminal ileum appeared normal.                           Internal hemorrhoids were found during                            retroflexion. The hemorrhoids were moderate.  The exam was otherwise without abnormality. No                            polyps or pathology to cause bleeding. Slight                            barotrauma was noted in the right colon with                            sufflation. Complications:            No immediate complications. Estimated blood loss:                            None. Estimated Blood Loss:     Estimated blood loss: none. Impression:               - Hemorrhoids found on perianal exam.                           - The examined portion of the ileum was normal.                           - Internal hemorrhoids.                           - The examination was otherwise normal.                           - No cause for anemia / GI bleeding on this exam. Recommendation:           - Patient has a contact number available for                            emergencies. The signs and symptoms of potential                             delayed complications were discussed with the                            patient. Return to normal activities tomorrow.                            Written discharge instructions were provided to the                            patient.                           - Resume previous diet.                           - Continue present medications.                           - Resume Eliquis today                           -  No further colonoscopy screening is recommended                            given negative exam today (would not be due for                            another 10 years, at which time he will be 68 years                            old)                           - If patient has recurrent anemia with symptoms                            concerning for bleeding, would recommend capsule                            endoscopy William Klang P. Maxamillion Banas, MD 08/18/2019 2:55:26 PM This report has been signed electronically.

## 2019-08-18 NOTE — Progress Notes (Signed)
Report to PACU, RN, vss, BBS= Clear.  

## 2019-08-18 NOTE — Patient Instructions (Signed)
Please see handout given to you on Hemorrhoids. You may resume you Eliquis today. Biopsy results will be back in 1-2 weeks.   YOU HAD AN ENDOSCOPIC PROCEDURE TODAY AT [facility]:   Refer to the procedure report that was given to you for any specific questions about what was found during the examination.  If the procedure report does not answer your questions, please call your gastroenterologist to clarify.  If you requested that your care partner not be given the details of your procedure findings, then the procedure report has been included in a sealed envelope for you to review at your convenience later.  YOU SHOULD EXPECT: Some feelings of bloating in the abdomen. Passage of more gas than usual.  Walking can help get rid of the air that was put into your GI tract during the procedure and reduce the bloating. If you had a lower endoscopy (such as a colonoscopy or flexible sigmoidoscopy) you may notice spotting of blood in your stool or on the toilet paper. If you underwent a bowel prep for your procedure, you may not have a normal bowel movement for a few days.  Please Note:  You might notice some irritation and congestion in your nose or some drainage.  This is from the oxygen used during your procedure.  There is no need for concern and it should clear up in a day or so.  SYMPTOMS TO REPORT IMMEDIATELY:   Following lower endoscopy (colonoscopy or flexible sigmoidoscopy):  Excessive amounts of blood in the stool  Significant tenderness or worsening of abdominal pains  Swelling of the abdomen that is new, acute  Fever of 100F or higher   Following upper endoscopy (EGD)  Vomiting of blood or coffee ground material  New chest pain or pain under the shoulder blades  Painful or persistently difficult swallowing  New shortness of breath  Fever of 100F or higher  Black, tarry-looking stools  For urgent or emergent issues, a gastroenterologist can be reached at any hour by  calling [phone].   DIET:  We do recommend a small meal at first, but then you may proceed to your regular diet.  Drink plenty of fluids but you should avoid alcoholic beverages for 24 hours.  ACTIVITY:  You should plan to take it easy for the rest of today and you should NOT DRIVE or use heavy machinery until tomorrow (because of the sedation medicines used during the test).    FOLLOW UP: Our staff will call the number listed on your records 48-72 hours following your procedure to check on you and address any questions or concerns that you may have regarding the information given to you following your procedure. If we do not reach you, we will leave a message.  We will attempt to reach you two times.  During this call, we will ask if you have developed any symptoms of COVID 19. If you develop any symptoms (ie: fever, flu-like symptoms, shortness of breath, cough etc.) before then, please call [phone].  If you test positive for Covid 19 in the 2 weeks post procedure, please call and report this information to [place].    If any biopsies were taken you will be contacted by phone or by letter within the next 1-3 weeks.  Please call us at [phone] if you have not heard about the biopsies in 3 weeks.    SIGNATURES/CONFIDENTIALITY: You and/or your care partner have signed paperwork which will be entered into your electronic  medical record.  These signatures attest to the fact that that the information above on your After Visit Summary has been reviewed and is understood.  Full responsibility of the confidentiality of this discharge information lies with you and/or your care-partner.  Thank you for letting us take care of your healthcare needs today.

## 2019-08-19 ENCOUNTER — Encounter (INDEPENDENT_AMBULATORY_CARE_PROVIDER_SITE_OTHER): Payer: BC Managed Care – PPO | Admitting: Ophthalmology
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
## 2019-08-20 ENCOUNTER — Ambulatory Visit (INDEPENDENT_AMBULATORY_CARE_PROVIDER_SITE_OTHER): Payer: BC Managed Care – PPO | Admitting: Cardiology

## 2019-08-20 ENCOUNTER — Telehealth (HOSPITAL_COMMUNITY): Payer: Self-pay

## 2019-08-20 ENCOUNTER — Encounter: Payer: Self-pay | Admitting: Cardiology

## 2019-08-20 ENCOUNTER — Telehealth: Payer: Self-pay

## 2019-08-20 ENCOUNTER — Other Ambulatory Visit: Payer: Self-pay

## 2019-08-20 VITALS — BP 154/86 | HR 74 | Temp 95.9°F | Ht 75.0 in | Wt 212.0 lb

## 2019-08-20 DIAGNOSIS — I10 Essential (primary) hypertension: Secondary | ICD-10-CM

## 2019-08-20 DIAGNOSIS — N184 Chronic kidney disease, stage 4 (severe): Secondary | ICD-10-CM

## 2019-08-20 DIAGNOSIS — I2510 Atherosclerotic heart disease of native coronary artery without angina pectoris: Secondary | ICD-10-CM

## 2019-08-20 DIAGNOSIS — I129 Hypertensive chronic kidney disease with stage 1 through stage 4 chronic kidney disease, or unspecified chronic kidney disease: Secondary | ICD-10-CM | POA: Diagnosis not present

## 2019-08-20 DIAGNOSIS — I4819 Other persistent atrial fibrillation: Secondary | ICD-10-CM | POA: Diagnosis not present

## 2019-08-20 NOTE — Telephone Encounter (Signed)

## 2019-08-20 NOTE — Telephone Encounter (Signed)
  Follow up Call-  Call back number 08/18/2019  Post procedure Call Back phone  # [phone]  Permission to leave phone message Yes  Some recent data might be hidden     Patient questions:  Do you have a fever, pain , or abdominal swelling? No. Pain Score  0 *  Have you tolerated food without any problems? Yes.    Have you been able to return to your normal activities? Yes.    Do you have any questions about your discharge instructions: Diet   No. Medications  No. Follow up visit  No.  Do you have questions or concerns about your Care? No.  Actions: * If pain score is 4 or above: No action needed, pain <4.  1. Have you developed a fever since your procedure? no  2.   Have you had an respiratory symptoms (SOB or cough) since your procedure? no  3.   Have you tested positive for COVID 19 since your procedure no  4.   Have you had any family members/close contacts diagnosed with the COVID 19 since your procedure?  no   If yes to any of these questions please route to Joylene John, RN and Alphonsa Gin, Therapist, sports.

## 2019-08-21 ENCOUNTER — Ambulatory Visit: Payer: BC Managed Care – PPO | Admitting: Gastroenterology

## 2019-08-21 ENCOUNTER — Inpatient Hospital Stay (HOSPITAL_COMMUNITY): Admission: RE | Admit: 2019-08-21 | Payer: BC Managed Care – PPO | Source: Ambulatory Visit
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
## 2019-08-26 ENCOUNTER — Encounter (INDEPENDENT_AMBULATORY_CARE_PROVIDER_SITE_OTHER): Payer: Self-pay | Admitting: Ophthalmology

## 2019-08-26 ENCOUNTER — Ambulatory Visit (INDEPENDENT_AMBULATORY_CARE_PROVIDER_SITE_OTHER): Payer: BC Managed Care – PPO | Admitting: Ophthalmology

## 2019-08-26 DIAGNOSIS — Z961 Presence of intraocular lens: Secondary | ICD-10-CM | POA: Diagnosis not present

## 2019-08-26 DIAGNOSIS — H25812 Combined forms of age-related cataract, left eye: Secondary | ICD-10-CM

## 2019-08-26 DIAGNOSIS — H40003 Preglaucoma, unspecified, bilateral: Secondary | ICD-10-CM | POA: Diagnosis not present

## 2019-08-26 DIAGNOSIS — E113411 Type 2 diabetes mellitus with severe nonproliferative diabetic retinopathy with macular edema, right eye: Secondary | ICD-10-CM

## 2019-08-26 DIAGNOSIS — E113512 Type 2 diabetes mellitus with proliferative diabetic retinopathy with macular edema, left eye: Secondary | ICD-10-CM

## 2019-08-26 DIAGNOSIS — H3581 Retinal edema: Secondary | ICD-10-CM

## 2019-08-26 DIAGNOSIS — H43822 Vitreomacular adhesion, left eye: Secondary | ICD-10-CM | POA: Diagnosis not present

## 2019-08-26 DIAGNOSIS — H35033 Hypertensive retinopathy, bilateral: Secondary | ICD-10-CM

## 2019-08-26 DIAGNOSIS — I10 Essential (primary) hypertension: Secondary | ICD-10-CM | POA: Diagnosis not present

## 2019-08-26 DIAGNOSIS — H18519 Endothelial corneal dystrophy, unspecified eye: Secondary | ICD-10-CM

## 2019-08-26 MED ORDER — BEVACIZUMAB CHEMO INJECTION 1.25MG/0.05ML SYRINGE FOR KALEIDOSCOPE
1.2500 mg | INTRAVITREAL | Status: AC | PRN
  Administered 2019-08-26: 1.25 mg via INTRAVITREAL

## 2019-09-01 ENCOUNTER — Telehealth: Payer: Self-pay | Admitting: Endocrinology

## 2019-09-01 DIAGNOSIS — Z794 Long term (current) use of insulin: Secondary | ICD-10-CM

## 2019-09-01 NOTE — Telephone Encounter (Signed)
All information about referrals is placed in the referral under the notes section-this was sent on 10/19 and again today-[facility] will reach out to schedule their patient

## 2019-09-01 NOTE — Telephone Encounter (Signed)
Ok, I placed referral 

## 2019-09-01 NOTE — Telephone Encounter (Signed)
Patient's daughter [place] ph# [phone] called on behalf of patient re: Does Dr. Loanne Drilling have a diabetic foot Doctor that he would recommend patient to see. Please call the contact above to advise.

## 2019-09-01 NOTE — Telephone Encounter (Signed)
Referral placed today. Please call pt/daughter with update on status of referral once you have worked on it.

## 2019-09-01 NOTE — Telephone Encounter (Signed)
Please advise 

## 2019-09-09 ENCOUNTER — Ambulatory Visit: Payer: BC Managed Care – PPO | Admitting: Podiatry

## 2019-09-10 ENCOUNTER — Telehealth: Payer: Self-pay | Admitting: Hematology

## 2019-09-10 NOTE — Telephone Encounter (Signed)
[place] PAL 11/20 moved f/u to 12/4. Confirmed with patient. Also confirmed 11/13 lab.

## 2019-09-11 ENCOUNTER — Inpatient Hospital Stay: Payer: BC Managed Care – PPO | Attending: Hematology

## 2019-09-11 ENCOUNTER — Other Ambulatory Visit: Payer: Self-pay

## 2019-09-11 DIAGNOSIS — D472 Monoclonal gammopathy: Secondary | ICD-10-CM | POA: Diagnosis not present

## 2019-09-11 LAB — CBC WITH DIFFERENTIAL/PLATELET
Abs Immature Granulocytes: 0.01 10*3/uL (ref 0.00–0.07)
Basophils Absolute: 0 10*3/uL (ref 0.0–0.1)
Basophils Relative: 0 %
Eosinophils Absolute: 0.1 10*3/uL (ref 0.0–0.5)
Eosinophils Relative: 2 %
HCT: 27.8 % — ABNORMAL LOW (ref 39.0–52.0)
Hemoglobin: 8.9 g/dL — ABNORMAL LOW (ref 13.0–17.0)
Immature Granulocytes: 0 %
Lymphocytes Relative: 18 %
Lymphs Abs: 0.7 10*3/uL (ref 0.7–4.0)
MCH: 27.8 pg (ref 26.0–34.0)
MCHC: 32 g/dL (ref 30.0–36.0)
MCV: 86.9 fL (ref 80.0–100.0)
Monocytes Absolute: 0.2 10*3/uL (ref 0.1–1.0)
Monocytes Relative: 6 %
Neutro Abs: 2.8 10*3/uL (ref 1.7–7.7)
Neutrophils Relative %: 74 %
Platelets: 177 10*3/uL (ref 150–400)
RBC: 3.2 MIL/uL — ABNORMAL LOW (ref 4.22–5.81)
RDW: 13 % (ref 11.5–15.5)
WBC: 3.9 10*3/uL — ABNORMAL LOW (ref 4.0–10.5)
nRBC: 0 % (ref 0.0–0.2)

## 2019-09-11 LAB — CMP (CANCER CENTER ONLY)
ALT: 9 U/L (ref 0–44)
AST: 8 U/L — ABNORMAL LOW (ref 15–41)
Albumin: 2.4 g/dL — ABNORMAL LOW (ref 3.5–5.0)
Alkaline Phosphatase: 109 U/L (ref 38–126)
Anion gap: 9 (ref 5–15)
BUN: 26 mg/dL — ABNORMAL HIGH (ref 8–23)
CO2: 24 mmol/L (ref 22–32)
Calcium: 7.8 mg/dL — ABNORMAL LOW (ref 8.9–10.3)
Chloride: 106 mmol/L (ref 98–111)
Creatinine: 2.73 mg/dL — ABNORMAL HIGH (ref 0.61–1.24)
GFR, Est AFR Am: 26 mL/min — ABNORMAL LOW (ref >60)
GFR, Estimated: 23 mL/min — ABNORMAL LOW (ref >60)
Glucose, Bld: 176 mg/dL — ABNORMAL HIGH (ref 70–99)
Potassium: 4.3 mmol/L (ref 3.5–5.1)
Sodium: 139 mmol/L (ref 135–145)
Total Bilirubin: 0.5 mg/dL (ref 0.3–1.2)
Total Protein: 5.8 g/dL — ABNORMAL LOW (ref 6.5–8.1)

## 2019-09-14 LAB — KAPPA/LAMBDA LIGHT CHAINS
Kappa free light chain: 452.9 mg/L — ABNORMAL HIGH (ref 3.3–19.4)
Kappa, lambda light chain ratio: 9.22 — ABNORMAL HIGH (ref 0.26–1.65)
Lambda free light chains: 49.1 mg/L — ABNORMAL HIGH (ref 5.7–26.3)

## 2019-09-15 LAB — MULTIPLE MYELOMA PANEL, SERUM
Albumin SerPl Elph-Mcnc: 2.5 g/dL — ABNORMAL LOW (ref 2.9–4.4)
Albumin/Glob SerPl: 0.9 (ref 0.7–1.7)
Alpha 1: 0.2 g/dL (ref 0.0–0.4)
Alpha2 Glob SerPl Elph-Mcnc: 0.8 g/dL (ref 0.4–1.0)
B-Globulin SerPl Elph-Mcnc: 0.9 g/dL (ref 0.7–1.3)
Gamma Glob SerPl Elph-Mcnc: 0.8 g/dL (ref 0.4–1.8)
Globulin, Total: 2.8 g/dL (ref 2.2–3.9)
IgA: 248 mg/dL (ref 61–437)
IgG (Immunoglobin G), Serum: 965 mg/dL (ref 603–1613)
IgM (Immunoglobulin M), Srm: 72 mg/dL (ref 20–172)
M Protein SerPl Elph-Mcnc: 0.3 g/dL — ABNORMAL HIGH
Total Protein ELP: 5.3 g/dL — ABNORMAL LOW (ref 6.0–8.5)

## 2019-09-17 ENCOUNTER — Ambulatory Visit (INDEPENDENT_AMBULATORY_CARE_PROVIDER_SITE_OTHER): Payer: BC Managed Care – PPO | Admitting: Podiatry

## 2019-09-17 ENCOUNTER — Encounter: Payer: Self-pay | Admitting: Podiatry

## 2019-09-17 ENCOUNTER — Other Ambulatory Visit: Payer: Self-pay

## 2019-09-17 DIAGNOSIS — M79675 Pain in left toe(s): Secondary | ICD-10-CM

## 2019-09-17 DIAGNOSIS — R600 Localized edema: Secondary | ICD-10-CM

## 2019-09-17 DIAGNOSIS — B351 Tinea unguium: Secondary | ICD-10-CM | POA: Diagnosis not present

## 2019-09-17 DIAGNOSIS — M79674 Pain in right toe(s): Secondary | ICD-10-CM | POA: Diagnosis not present

## 2019-09-17 NOTE — Progress Notes (Signed)
   Subjective:    Patient ID: William Webb [facility], male    DOB: 27-Dec-1950, 68 y.o.   MRN: [id number]  HPI    Review of Systems  All other systems reviewed and are negative.      Objective:   Physical Exam        Assessment & Plan:

## 2019-09-18 ENCOUNTER — Ambulatory Visit: Payer: BC Managed Care – PPO | Admitting: Hematology

## 2019-09-21 ENCOUNTER — Other Ambulatory Visit: Payer: Self-pay

## 2019-09-21 NOTE — Progress Notes (Signed)
Subjective:   Patient ID: William Webb, male   DOB: 68 y.o.   MRN: [id number]   HPI Patient presents with severe elongation of nailbeds 1-5 both feet that are very thick and dystrophic and patient cannot cut.  Patient also is concerned about swelling in the ankle region bilateral stating it is been there for a fairly long time.  Patient does not smoke and is not significantly active at the current time   Review of Systems  All other systems reviewed and are negative.       Objective:  Physical Exam Vitals signs and nursing note reviewed.  Constitutional:      Appearance: He is well-developed.  Pulmonary:     Effort: Pulmonary effort is normal.  Musculoskeletal: Normal range of motion.  Skin:    General: Skin is warm.  Neurological:     Mental Status: He is alert.     Neurovascular status was found to be intact range of motion reduced subtalar midtarsal joint.  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  Debrided nailbeds 1-5 both feet with no iatrogenic bleeding which could be repeated as needed and patient will be seen back 3 months for routine or earlier if needed

## 2019-09-23 ENCOUNTER — Encounter (INDEPENDENT_AMBULATORY_CARE_PROVIDER_SITE_OTHER): Payer: Medicare HMO | Admitting: Ophthalmology

## 2019-09-23 ENCOUNTER — Other Ambulatory Visit: Payer: Self-pay

## 2019-09-23 ENCOUNTER — Ambulatory Visit (INDEPENDENT_AMBULATORY_CARE_PROVIDER_SITE_OTHER): Payer: BC Managed Care – PPO | Admitting: Endocrinology

## 2019-09-23 ENCOUNTER — Encounter: Payer: Self-pay | Admitting: Endocrinology

## 2019-09-23 VITALS — BP 142/80 | HR 75 | Ht 75.0 in | Wt 222.0 lb

## 2019-09-23 DIAGNOSIS — N1831 Chronic kidney disease, stage 3a: Secondary | ICD-10-CM | POA: Diagnosis not present

## 2019-09-23 DIAGNOSIS — Z794 Long term (current) use of insulin: Secondary | ICD-10-CM | POA: Diagnosis not present

## 2019-09-23 DIAGNOSIS — E1121 Type 2 diabetes mellitus with diabetic nephropathy: Secondary | ICD-10-CM | POA: Diagnosis not present

## 2019-09-23 LAB — POCT GLYCOSYLATED HEMOGLOBIN (HGB A1C): Hemoglobin A1C: 8 % — AB (ref 4.0–5.6)

## 2019-09-23 LAB — BASIC METABOLIC PANEL
BUN/Creatinine Ratio: 10 (ref 10–24)
BUN: 27 mg/dL (ref 8–27)
CO2: 26 mmol/L (ref 20–29)
Calcium: 7.8 mg/dL — ABNORMAL LOW (ref 8.6–10.2)
Chloride: 105 mmol/L (ref 96–106)
Creatinine, Ser: 2.72 mg/dL — ABNORMAL HIGH (ref 0.76–1.27)
GFR calc Af Amer: 26 mL/min/{1.73_m2} — ABNORMAL LOW (ref >59)
GFR calc non Af Amer: 23 mL/min/{1.73_m2} — ABNORMAL LOW (ref >59)
Glucose: 177 mg/dL — ABNORMAL HIGH (ref 65–99)
Potassium: 4.5 mmol/L (ref 3.5–5.2)
Sodium: 140 mmol/L (ref 134–144)
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
## 2019-09-23 NOTE — Patient Instructions (Addendum)
Your blood pressure is high today.  Please see your primary care provider soon, to have it rechecked.   Please continue the same insulin.  You should take no matter what your blood sugar is. Please come back for a follow-up appointment in 2 months.

## 2019-09-25 ENCOUNTER — Other Ambulatory Visit (HOSPITAL_COMMUNITY)
Admission: RE | Admit: 2019-09-25 | Discharge: 2019-09-25 | Disposition: A | Discharge status: Home / Self Care | Payer: BC Managed Care – PPO | Source: Ambulatory Visit | Attending: Cardiology | Admitting: Cardiology

## 2019-09-25 DIAGNOSIS — Z01812 Encounter for preprocedural laboratory examination: Secondary | ICD-10-CM | POA: Diagnosis present

## 2019-09-25 DIAGNOSIS — Z20828 Contact with and (suspected) exposure to other viral communicable diseases: Secondary | ICD-10-CM | POA: Diagnosis not present

## 2019-09-25 LAB — SARS CORONAVIRUS 2 (TAT 6-24 HRS): SARS Coronavirus 2: NEGATIVE

## 2019-09-29 ENCOUNTER — Ambulatory Visit (HOSPITAL_COMMUNITY)
Admission: RE | Admit: 2019-09-29 | Discharge: 2019-09-29 | Disposition: A | Discharge status: Home / Self Care | Payer: BC Managed Care – PPO | Attending: Cardiology | Admitting: Cardiology

## 2019-09-29 ENCOUNTER — Other Ambulatory Visit: Payer: Self-pay

## 2019-09-29 ENCOUNTER — Ambulatory Visit (HOSPITAL_COMMUNITY): Payer: BC Managed Care – PPO

## 2019-09-29 ENCOUNTER — Encounter (HOSPITAL_COMMUNITY): Payer: Self-pay | Admitting: Emergency Medicine

## 2019-09-29 ENCOUNTER — Encounter (HOSPITAL_COMMUNITY)
Admission: RE | Disposition: A | Discharge status: Home / Self Care | Payer: Self-pay | Source: Home / Self Care | Attending: Cardiology

## 2019-09-29 DIAGNOSIS — E1136 Type 2 diabetes mellitus with diabetic cataract: Secondary | ICD-10-CM | POA: Diagnosis not present

## 2019-09-29 DIAGNOSIS — I252 Old myocardial infarction: Secondary | ICD-10-CM | POA: Insufficient documentation

## 2019-09-29 DIAGNOSIS — I509 Heart failure, unspecified: Secondary | ICD-10-CM | POA: Insufficient documentation

## 2019-09-29 DIAGNOSIS — E1122 Type 2 diabetes mellitus with diabetic chronic kidney disease: Secondary | ICD-10-CM | POA: Insufficient documentation

## 2019-09-29 DIAGNOSIS — E11319 Type 2 diabetes mellitus with unspecified diabetic retinopathy without macular edema: Secondary | ICD-10-CM | POA: Diagnosis not present

## 2019-09-29 DIAGNOSIS — M1990 Unspecified osteoarthritis, unspecified site: Secondary | ICD-10-CM | POA: Insufficient documentation

## 2019-09-29 DIAGNOSIS — N183 Chronic kidney disease, stage 3 unspecified: Secondary | ICD-10-CM | POA: Insufficient documentation

## 2019-09-29 DIAGNOSIS — I130 Hypertensive heart and chronic kidney disease with heart failure and stage 1 through stage 4 chronic kidney disease, or unspecified chronic kidney disease: Secondary | ICD-10-CM | POA: Insufficient documentation

## 2019-09-29 DIAGNOSIS — I491 Atrial premature depolarization: Secondary | ICD-10-CM | POA: Diagnosis not present

## 2019-09-29 DIAGNOSIS — K219 Gastro-esophageal reflux disease without esophagitis: Secondary | ICD-10-CM | POA: Diagnosis not present

## 2019-09-29 DIAGNOSIS — I4811 Longstanding persistent atrial fibrillation: Secondary | ICD-10-CM

## 2019-09-29 DIAGNOSIS — Z87891 Personal history of nicotine dependence: Secondary | ICD-10-CM | POA: Insufficient documentation

## 2019-09-29 DIAGNOSIS — H269 Unspecified cataract: Secondary | ICD-10-CM | POA: Diagnosis not present

## 2019-09-29 DIAGNOSIS — E785 Hyperlipidemia, unspecified: Secondary | ICD-10-CM | POA: Diagnosis not present

## 2019-09-29 DIAGNOSIS — Z7901 Long term (current) use of anticoagulants: Secondary | ICD-10-CM | POA: Insufficient documentation

## 2019-09-29 DIAGNOSIS — I2510 Atherosclerotic heart disease of native coronary artery without angina pectoris: Secondary | ICD-10-CM | POA: Diagnosis not present

## 2019-09-29 DIAGNOSIS — I6523 Occlusion and stenosis of bilateral carotid arteries: Secondary | ICD-10-CM | POA: Insufficient documentation

## 2019-09-29 DIAGNOSIS — I4819 Other persistent atrial fibrillation: Secondary | ICD-10-CM | POA: Insufficient documentation

## 2019-09-29 DIAGNOSIS — Z79899 Other long term (current) drug therapy: Secondary | ICD-10-CM | POA: Insufficient documentation

## 2019-09-29 DIAGNOSIS — Z794 Long term (current) use of insulin: Secondary | ICD-10-CM | POA: Insufficient documentation

## 2019-09-29 DIAGNOSIS — N1831 Chronic kidney disease, stage 3a: Secondary | ICD-10-CM | POA: Diagnosis not present

## 2019-09-29 DIAGNOSIS — E1142 Type 2 diabetes mellitus with diabetic polyneuropathy: Secondary | ICD-10-CM | POA: Diagnosis not present

## 2019-09-29 HISTORY — PX: CARDIOVERSION: SHX1299

## 2019-09-29 LAB — GLUCOSE, CAPILLARY: Glucose-Capillary: 145 mg/dL — ABNORMAL HIGH (ref 70–99)

## 2019-09-29 SURGERY — CARDIOVERSION
Anesthesia: General

## 2019-09-29 MED ORDER — HYDRALAZINE HCL 20 MG/ML IJ SOLN
INTRAMUSCULAR | Status: AC
  Filled 2019-09-29: qty 1

## 2019-09-29 MED ORDER — LIDOCAINE 2% (20 MG/ML) 5 ML SYRINGE
INTRAMUSCULAR | Status: DC | PRN
  Administered 2019-09-29: 20 mg via INTRAVENOUS

## 2019-09-29 MED ORDER — HYDRALAZINE HCL 20 MG/ML IJ SOLN
10.0000 mg | Freq: Once | INTRAMUSCULAR | Status: AC
  Administered 2019-09-29: 10 mg via INTRAVENOUS

## 2019-09-29 MED ORDER — PROPOFOL 10 MG/ML IV BOLUS
INTRAVENOUS | Status: DC | PRN
  Administered 2019-09-29: 60 mg via INTRAVENOUS

## 2019-09-29 MED ORDER — SODIUM CHLORIDE 0.9 % IV SOLN
INTRAVENOUS | Status: DC | PRN
  Administered 2019-09-29: 13:00:00 via INTRAVENOUS
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
## 2019-09-29 NOTE — CV Procedure (Signed)
Direct current cardioversion:  Indication symptomatic: Symptomatic atrial fibrillation  Procedure: Under deep sedation administered and monitored by anesthesiology, synchronized direct current cardioversion performed. Patient was delivered with 120 Joules of electricity X 1 with success to NSR. Patient tolerated the procedure well. No immediate complication noted.   Arneshia Ade J Amedio Bowlby, MD [facility] Pager: [phone] Office: [phone] If no answer Cell [phone]    

## 2019-09-29 NOTE — Anesthesia Preprocedure Evaluation (Signed)
Anesthesia Evaluation  Patient identified by MRN, date of birth, ID band Patient awake    Reviewed: Allergy & Precautions, NPO status , Patient's Chart, lab work & pertinent test results  Airway Mallampati: II  TM Distance: >3 FB     Dental   Pulmonary former smoker,    breath sounds clear to auscultation       Cardiovascular hypertension, Pt. on medications and Pt. on home beta blockers + CAD, + Past MI, +CHF and + DOE   Rhythm:Irregular Rate:Normal     Neuro/Psych  Neuromuscular disease    GI/Hepatic Neg liver ROS, GERD  ,  Endo/Other  diabetes, Type 2  Renal/GU CRFRenal disease     Musculoskeletal  (+) Arthritis ,   Abdominal   Peds  Hematology   Anesthesia Other Findings   Reproductive/Obstetrics                             Anesthesia Physical Anesthesia Plan  ASA: III  Anesthesia Plan: General   Post-op Pain Management:    Induction: Intravenous  PONV Risk Score and Plan: 2 and Treatment may vary due to age or medical condition  Airway Management Planned: Natural Airway and Mask  Additional Equipment:   Intra-op Plan:   Post-operative Plan:   Informed Consent: I have reviewed the patients History and Physical, chart, labs and discussed the procedure including the risks, benefits and alternatives for the proposed anesthesia with the patient or authorized representative who has indicated his/her understanding and acceptance.       Plan Discussed with:   Anesthesia Plan Comments:         Anesthesia Quick Evaluation

## 2019-09-29 NOTE — Interval H&P Note (Signed)
History and Physical Interval Note:  09/29/2019 12:15 PM  William Webb  has presented today for surgery, with the diagnosis of coronary artery disease, atrial fibrillation, hypertension.  The various methods of treatment have been discussed with the patient and family. After consideration of risks, benefits and other options for treatment, the patient has consented to  Procedure(s): CARDIOVERSION (N/A) as a surgical intervention.  The patient's history has been reviewed, patient examined, no change in status, stable for surgery.  I have reviewed the patient's chart and labs.  Questions were answered to the patient's satisfaction.     [place]

## 2019-09-29 NOTE — Transfer of Care (Signed)
Immediate Anesthesia Transfer of Care Note  Patient: William Webb [facility]  Procedure(s) Performed: CARDIOVERSION (N/A )  Patient Location: PACU and Endoscopy Unit  Anesthesia Type:General  Level of Consciousness: patient cooperative and responds to stimulation  Airway & Oxygen Therapy: Patient Spontanous Breathing  Post-op Assessment: Report given to RN and Post -op Vital signs reviewed and stable  Post vital signs: Reviewed and stable  Last Vitals:  Vitals Value Taken Time  BP 193/80 09/29/19 1308  Temp 36.5 C 09/29/19 1308  Pulse 64 09/29/19 1308  Resp 13 09/29/19 1308  SpO2 99 % 09/29/19 1308    Last Pain:  Vitals:   09/29/19 1308  TempSrc: Temporal  PainSc: 0-No pain         Complications: No apparent anesthesia complications

## 2019-09-30 NOTE — Anesthesia Postprocedure Evaluation (Signed)
Anesthesia Post Note  Patient: William Webb [facility]  Procedure(s) Performed: CARDIOVERSION (N/A )     Patient location during evaluation: PACU Anesthesia Type: General Level of consciousness: awake and alert Pain management: pain level controlled Vital Signs Assessment: post-procedure vital signs reviewed and stable Respiratory status: spontaneous breathing, nonlabored ventilation, respiratory function stable and patient connected to nasal cannula oxygen Cardiovascular status: blood pressure returned to baseline and stable Postop Assessment: no apparent nausea or vomiting Anesthetic complications: no    Last Vitals:  Vitals:   09/29/19 1331 09/29/19 1340  BP: (!) 178/78 (!) 172/94  Pulse: 68 67  Resp: 10 13  Temp:    SpO2: 96% 96%    Last Pain:  Vitals:   09/29/19 1308  TempSrc: Temporal  PainSc: 0-No pain                 Tiajuana Amass

## 2019-10-02 ENCOUNTER — Inpatient Hospital Stay: Payer: BC Managed Care – PPO | Attending: Hematology | Admitting: Hematology

## 2019-10-09 ENCOUNTER — Encounter: Payer: Self-pay | Admitting: Cardiology

## 2019-10-09 ENCOUNTER — Ambulatory Visit (INDEPENDENT_AMBULATORY_CARE_PROVIDER_SITE_OTHER): Payer: BC Managed Care – PPO | Admitting: Cardiology

## 2019-10-09 ENCOUNTER — Other Ambulatory Visit: Payer: Self-pay

## 2019-10-09 VITALS — BP 185/94 | HR 72 | Ht 75.0 in | Wt 225.0 lb

## 2019-10-09 DIAGNOSIS — I4819 Other persistent atrial fibrillation: Secondary | ICD-10-CM | POA: Diagnosis not present

## 2019-10-09 DIAGNOSIS — I2510 Atherosclerotic heart disease of native coronary artery without angina pectoris: Secondary | ICD-10-CM

## 2019-10-09 DIAGNOSIS — I10 Essential (primary) hypertension: Secondary | ICD-10-CM
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
## 2019-10-10 ENCOUNTER — Encounter: Payer: Self-pay | Admitting: Cardiology

## 2019-10-12 ENCOUNTER — Encounter (INDEPENDENT_AMBULATORY_CARE_PROVIDER_SITE_OTHER): Payer: BC Managed Care – PPO | Admitting: Ophthalmology

## 2019-10-12 DIAGNOSIS — E113411 Type 2 diabetes mellitus with severe nonproliferative diabetic retinopathy with macular edema, right eye: Secondary | ICD-10-CM

## 2019-10-12 DIAGNOSIS — H25812 Combined forms of age-related cataract, left eye: Secondary | ICD-10-CM

## 2019-10-12 DIAGNOSIS — I10 Essential (primary) hypertension: Secondary | ICD-10-CM

## 2019-10-12 DIAGNOSIS — E113512 Type 2 diabetes mellitus with proliferative diabetic retinopathy with macular edema, left eye: Secondary | ICD-10-CM

## 2019-10-12 DIAGNOSIS — H25813 Combined forms of age-related cataract, bilateral: Secondary | ICD-10-CM

## 2019-10-12 DIAGNOSIS — H35033 Hypertensive retinopathy, bilateral: Secondary | ICD-10-CM

## 2019-10-12 DIAGNOSIS — H43822 Vitreomacular adhesion, left eye: Secondary | ICD-10-CM

## 2019-10-12 DIAGNOSIS — H40003 Preglaucoma, unspecified, bilateral: Secondary | ICD-10-CM

## 2019-10-12 DIAGNOSIS — H3581 Retinal edema: Secondary | ICD-10-CM

## 2019-10-12 DIAGNOSIS — Z961 Presence of intraocular lens: Secondary | ICD-10-CM

## 2019-10-28 ENCOUNTER — Telehealth: Payer: Self-pay

## 2019-10-28 DIAGNOSIS — D649 Anemia, unspecified: Secondary | ICD-10-CM

## 2019-10-28 NOTE — Telephone Encounter (Signed)
Dr. Havery Moros - You wanted this pt to have CBC in January to monitor anemia.  His EGD/Colon in Oct 2020 were normal. I see he had a CBC last month on 11-13 with worsening anemia, Hgb of 8.9. He saw cardiology, Dr. Virgina Jock on 10-09-19.  Would you like him to go for CBC now?  You had also mentioned you may consider possible Capsule endo if anemia persisted.  Please advise.

## 2019-10-28 NOTE — Telephone Encounter (Signed)
-----   Message from Roetta Sessions, [place] sent at 08/25/2019  1:46 PM EDT ----- Regarding: cbc due in Jan 2021 PT due for cbc in Jan 2021 for anemia

## 2019-10-28 NOTE — Telephone Encounter (Signed)
Called and spoke to pt. Scheduled him for office visit on 1-7 with Dr. Havery Moros.  He agreed to go to the lab for CBC on Monday or Tuesday, 1-4 or 5 prior to appt.  CBC order placed.

## 2019-10-28 NOTE — Telephone Encounter (Signed)
Thanks Jan. I think he may have a multifactorial anemia. I would like to see him back in the office for follow up. He should also have a repeat CBC in the next few weeks. Thanks

## 2019-11-03 ENCOUNTER — Other Ambulatory Visit: Payer: Self-pay | Admitting: Cardiology

## 2019-11-03 DIAGNOSIS — I4819 Other persistent atrial fibrillation: Secondary | ICD-10-CM

## 2019-11-04 ENCOUNTER — Other Ambulatory Visit: Payer: Self-pay

## 2019-11-04 ENCOUNTER — Ambulatory Visit (HOSPITAL_COMMUNITY)
Admission: RE | Admit: 2019-11-04 | Discharge: 2019-11-04 | Disposition: A | Discharge status: Home / Self Care | Payer: BC Managed Care – PPO | Source: Ambulatory Visit | Attending: Vascular Surgery | Admitting: Vascular Surgery

## 2019-11-04 ENCOUNTER — Ambulatory Visit (INDEPENDENT_AMBULATORY_CARE_PROVIDER_SITE_OTHER): Payer: BC Managed Care – PPO | Admitting: Physician Assistant

## 2019-11-04 VITALS — BP 177/82 | HR 69 | Temp 98.8°F | Resp 18 | Ht 75.0 in | Wt 225.8 lb

## 2019-11-04 DIAGNOSIS — N185 Chronic kidney disease, stage 5: Secondary | ICD-10-CM | POA: Diagnosis not present
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
## 2019-11-05 ENCOUNTER — Encounter: Payer: Self-pay | Admitting: Gastroenterology

## 2019-11-05 ENCOUNTER — Ambulatory Visit (INDEPENDENT_AMBULATORY_CARE_PROVIDER_SITE_OTHER): Payer: BC Managed Care – PPO | Admitting: Gastroenterology

## 2019-11-05 ENCOUNTER — Other Ambulatory Visit (INDEPENDENT_AMBULATORY_CARE_PROVIDER_SITE_OTHER): Payer: BC Managed Care – PPO

## 2019-11-05 VITALS — BP 170/60 | HR 71 | Temp 97.0°F | Ht 75.0 in | Wt 227.0 lb

## 2019-11-05 DIAGNOSIS — Z7901 Long term (current) use of anticoagulants: Secondary | ICD-10-CM

## 2019-11-05 DIAGNOSIS — K219 Gastro-esophageal reflux disease without esophagitis: Secondary | ICD-10-CM

## 2019-11-05 DIAGNOSIS — D649 Anemia, unspecified: Secondary | ICD-10-CM

## 2019-11-05 DIAGNOSIS — R195 Other fecal abnormalities: Secondary | ICD-10-CM | POA: Diagnosis not present

## 2019-11-05 LAB — CBC WITH DIFFERENTIAL/PLATELET
Basophils Absolute: 0 10*3/uL (ref 0.0–0.1)
Basophils Relative: 0.6 % (ref 0.0–3.0)
Eosinophils Absolute: 0.1 10*3/uL (ref 0.0–0.7)
Eosinophils Relative: 2.4 % (ref 0.0–5.0)
HCT: 28 % — ABNORMAL LOW (ref 39.0–52.0)
Hemoglobin: 9 g/dL — ABNORMAL LOW (ref 13.0–17.0)
Lymphocytes Relative: 13.8 % (ref 12.0–46.0)
Lymphs Abs: 0.5 10*3/uL — ABNORMAL LOW (ref 0.7–4.0)
MCHC: 32.3 g/dL (ref 30.0–36.0)
MCV: 85.4 fl (ref 78.0–100.0)
Monocytes Absolute: 0.4 10*3/uL (ref 0.1–1.0)
Monocytes Relative: 9.5 % (ref 3.0–12.0)
Neutro Abs: 2.7 10*3/uL (ref 1.4–7.7)
Neutrophils Relative %: 73.7 % (ref 43.0–77.0)
Platelets: 185 10*3/uL (ref 150.0–400.0)
RBC: 3.28 Mil/uL — ABNORMAL LOW (ref 4.22–5.81)
RDW: 16.7 % — ABNORMAL HIGH (ref 11.5–15.5)
WBC: 3.7 10*3/uL — ABNORMAL LOW (ref 4.0–10.5)
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
## 2019-11-05 NOTE — Patient Instructions (Addendum)
If you are age 70 or older, your body mass index should be between 23-30. Your Body mass index is 28.37 kg/m. If this is out of the aforementioned range listed, please consider follow up with your Primary Care Provider.  If you are age 70 or younger, your body mass index should be between 19-25. Your Body mass index is 28.37 kg/m. If this is out of the aformentioned range listed, please consider follow up with your Primary Care Provider.   Your provider has requested that you go to the basement level for lab work before leaving today. Press "B" on the elevator. The lab is located at the first door on the left as you exit the elevator.  Please follow up with Dr Irene Limbo at Hematology.  You can reach them at [phone].  Due to recent changes in healthcare laws, you may see the results of your imaging and laboratory studies on MyChart before your provider has had a chance to review them.  We understand that in some cases there may be results that are confusing or concerning to you. Not all laboratory results come back in the same time frame and the provider may be waiting for multiple results in order to interpret others.  Please give [place] 48 hours in order for your provider to thoroughly review all the results before contacting the office for clarification of your results.   Thank you for entrusting me with your care and for choosing [facility], Dr. [place] Cellar

## 2019-11-11 ENCOUNTER — Telehealth: Payer: Self-pay | Admitting: Hematology

## 2019-11-11 ENCOUNTER — Telehealth: Payer: Self-pay

## 2019-11-11 NOTE — Telephone Encounter (Signed)
-----   Message from Roetta Sessions, CMA sent at 11/05/2019  6:07 PM EST -----  ----- Message ----- From: Stevan Born, [place]: 11/05/2019   5:09 PM EST To: Roetta Sessions, CMA  Did patient make an appointment with Hematology/ Dr Irene Limbo?  Thank you, Elmyra Ricks

## 2019-11-11 NOTE — Telephone Encounter (Signed)
Dr. Havery Moros saw pt on 11-05-19. Pt missed his appt with Dr. Irene Limbo, Hematology in Dec 2020. Patient was advised to call dr. [name] office and reschedule his appointment.  Called their office today and pt does not have an appt.  They indicated they will call the pt to get him rescheduled with Dr. Irene Limbo.

## 2019-11-11 NOTE — Telephone Encounter (Signed)
Scheduled per 1/13 sch msg. Called and spoke with pt, confirmed 2/2 appt

## 2019-11-12 DIAGNOSIS — N184 Chronic kidney disease, stage 4 (severe): Secondary | ICD-10-CM | POA: Diagnosis not present

## 2019-11-13 ENCOUNTER — Other Ambulatory Visit (HOSPITAL_COMMUNITY)
Admission: RE | Admit: 2019-11-13 | Discharge: 2019-11-13 | Disposition: A | Discharge status: Home / Self Care | Payer: BC Managed Care – PPO | Source: Ambulatory Visit | Attending: Vascular Surgery | Admitting: Vascular Surgery

## 2019-11-13 DIAGNOSIS — Z01812 Encounter for preprocedural laboratory examination: Secondary | ICD-10-CM | POA: Insufficient documentation

## 2019-11-13 DIAGNOSIS — Z20822 Contact with and (suspected) exposure to covid-19: Secondary | ICD-10-CM | POA: Insufficient documentation

## 2019-11-14 LAB — NOVEL CORONAVIRUS, NAA (HOSP ORDER, SEND-OUT TO REF LAB; TAT 18-24 HRS): SARS-CoV-2, NAA: NOT DETECTED

## 2019-11-16 ENCOUNTER — Encounter (HOSPITAL_COMMUNITY): Payer: Self-pay | Admitting: Vascular Surgery

## 2019-11-16 ENCOUNTER — Other Ambulatory Visit: Payer: Self-pay

## 2019-11-16 NOTE — Progress Notes (Addendum)
Mr [place] denies chest pain. Mr [place] states he gets short of breath when he walks, patient has sent a message to kidney doctor.  Mr [place] tested negative for Covid 11/13/2019 and has been in quarantine since that time.  Mr [place] has type II diabetes. Patient is on Sliding Scale of Humalog 3 times a day. Mr [place] reports that CBGs run in the 100's- 125.  Last A1C was 8.0 08/2019. Patient's endocrinologist is Dr Renato Shin. I instructed patient to check CBG after awaking and every 2 hours until arrival  to the hospital.  I Instructed patient if CBG is less than 70 to drink 1/2 cup of a clear juice. Recheck CBG in 15 minutes then call pre- op desk at [phone] for further instructions.  PCP is Dr. Donnie Coffin, I sent Stop [place] [facility] assessment to Dr Marlou Sa through staff note.

## 2019-11-16 NOTE — Progress Notes (Signed)
   11/16/19 1621  OBSTRUCTIVE SLEEP APNEA  Have you ever been diagnosed with sleep apnea through a sleep study? No  Do you snore loudly (loud enough to be heard through closed doors)?  1  Do you often feel tired, fatigued, or sleepy during the daytime (such as falling asleep during driving or talking to someone)? 1  Has anyone observed you stop breathing during your sleep? 1  Do you have, or are you being treated for high blood pressure? 1  BMI more than 35 kg/m2? 0  Age > 50 (1-yes) 1  Neck circumference greater than:Male 16 inches or larger, Male 17inches or larger? 1  Male Gender (Yes=1) 1  Obstructive Sleep Apnea Score 7

## 2019-11-16 NOTE — Addendum Note (Signed)
Addended by: York Cerise C on: 11/16/2019 04:22 PM   Modules accepted: Orders, SmartSet

## 2019-11-17 ENCOUNTER — Other Ambulatory Visit: Payer: Self-pay

## 2019-11-17 ENCOUNTER — Ambulatory Visit (HOSPITAL_COMMUNITY)
Admission: RE | Admit: 2019-11-17 | Payer: BC Managed Care – PPO | Source: Home / Self Care | Admitting: Vascular Surgery

## 2019-11-17 HISTORY — DX: Fibromyalgia: M79.7

## 2019-11-17 HISTORY — DX: Dyspnea, unspecified: R06.00

## 2019-11-17 SURGERY — TRANSPOSITION, VEIN, BASILIC
Anesthesia: Monitor Anesthesia Care

## 2019-11-17 NOTE — Progress Notes (Signed)
Orders still in - requisition signed.   William Webb [name]

## 2019-11-24 ENCOUNTER — Other Ambulatory Visit: Payer: Self-pay

## 2019-11-24 DIAGNOSIS — E559 Vitamin D deficiency, unspecified: Secondary | ICD-10-CM | POA: Diagnosis not present

## 2019-11-24 DIAGNOSIS — I129 Hypertensive chronic kidney disease with stage 1 through stage 4 chronic kidney disease, or unspecified chronic kidney disease: Secondary | ICD-10-CM | POA: Diagnosis not present

## 2019-11-24 DIAGNOSIS — N184 Chronic kidney disease, stage 4 (severe): Secondary | ICD-10-CM | POA: Diagnosis not present

## 2019-11-24 DIAGNOSIS — E1122 Type 2 diabetes mellitus with diabetic chronic kidney disease: Secondary | ICD-10-CM | POA: Diagnosis not present

## 2019-11-24 DIAGNOSIS — I2510 Atherosclerotic heart disease of native coronary artery without angina pectoris: Secondary | ICD-10-CM | POA: Diagnosis not present

## 2019-11-24 DIAGNOSIS — D631 Anemia in chronic kidney disease: Secondary | ICD-10-CM | POA: Diagnosis not present

## 2019-11-24 DIAGNOSIS — D472 Monoclonal gammopathy: Secondary | ICD-10-CM | POA: Diagnosis not present

## 2019-11-24 DIAGNOSIS — R809 Proteinuria, unspecified: Secondary | ICD-10-CM | POA: Diagnosis not present

## 2019-11-26 ENCOUNTER — Encounter: Payer: Self-pay | Admitting: Endocrinology

## 2019-11-26 ENCOUNTER — Ambulatory Visit (INDEPENDENT_AMBULATORY_CARE_PROVIDER_SITE_OTHER): Payer: BC Managed Care – PPO | Admitting: Endocrinology

## 2019-11-26 ENCOUNTER — Other Ambulatory Visit: Payer: Self-pay

## 2019-11-26 DIAGNOSIS — E1121 Type 2 diabetes mellitus with diabetic nephropathy: Secondary | ICD-10-CM

## 2019-11-26 DIAGNOSIS — N1831 Chronic kidney disease, stage 3a: Secondary | ICD-10-CM | POA: Diagnosis not present

## 2019-11-26 DIAGNOSIS — Z794 Long term (current) use of insulin: Secondary | ICD-10-CM | POA: Diagnosis not present

## 2019-11-26 DIAGNOSIS — E1122 Type 2 diabetes mellitus with diabetic chronic kidney disease: Secondary | ICD-10-CM
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
## 2019-11-26 NOTE — Patient Instructions (Addendum)
Please continue the same insulin.   Please come back for a follow-up appointment in 2 months.

## 2019-11-27 ENCOUNTER — Encounter (HOSPITAL_COMMUNITY)
Admission: RE | Admit: 2019-11-27 | Discharge: 2019-11-27 | Disposition: A | Discharge status: Home / Self Care | Payer: BC Managed Care – PPO | Source: Ambulatory Visit | Attending: Nephrology | Admitting: Nephrology

## 2019-11-27 VITALS — BP 129/92 | HR 70 | Temp 97.0°F | Resp 18

## 2019-11-27 DIAGNOSIS — N1831 Chronic kidney disease, stage 3a: Secondary | ICD-10-CM | POA: Diagnosis not present

## 2019-11-27 LAB — POCT HEMOGLOBIN-HEMACUE: Hemoglobin: 7.7 g/dL — ABNORMAL LOW (ref 13.0–17.0)

## 2019-11-27 MED ORDER — EPOETIN ALFA-EPBX 10000 UNIT/ML IJ SOLN: 20000.0000 [IU] | Freq: Once | INTRAMUSCULAR | Status: DC

## 2019-11-27 MED ORDER — EPOETIN ALFA-EPBX 10000 UNIT/ML IJ SOLN: 10000.0000 [IU] | INTRAMUSCULAR | Status: DC

## 2019-11-27 MED ORDER — EPOETIN ALFA-EPBX 10000 UNIT/ML IJ SOLN
INTRAMUSCULAR | Status: AC
  Administered 2019-11-27: 20000 [IU]
  Filled 2019-11-27: qty 1

## 2019-11-27 MED ORDER — EPOETIN ALFA-EPBX 10000 UNIT/ML IJ SOLN
INTRAMUSCULAR | Status: AC
  Filled 2019-11-27: qty 1

## 2019-11-27 NOTE — Discharge Instructions (Signed)

## 2019-11-27 NOTE — Progress Notes (Signed)
Pt here for first retacrit injection.  C/O of nosebleed (on Eliquis) and just being tired as if having run a marathon, legs heavy.  HGB today was 7.7 via hemocue.  Dr Royce Macadamia notified.  Orders received to give a one time dose of Retacrit 20000 units.  Discussed change with patient and instructed him to call for any changes or increased symptoms

## 2019-12-01 ENCOUNTER — Inpatient Hospital Stay: Payer: BC Managed Care – PPO | Attending: Hematology | Admitting: Hematology

## 2019-12-02 ENCOUNTER — Other Ambulatory Visit: Payer: Self-pay

## 2019-12-02 ENCOUNTER — Encounter (HOSPITAL_COMMUNITY): Payer: Self-pay | Admitting: Vascular Surgery

## 2019-12-02 ENCOUNTER — Encounter (HOSPITAL_COMMUNITY): Payer: Self-pay
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
## 2019-12-02 NOTE — Progress Notes (Signed)
   12/02/19 1403  [place]  Have you ever been diagnosed with sleep apnea through a sleep study? No  Do you snore loudly (loud enough to be heard through closed doors)?  1  Do you often feel tired, fatigued, or sleepy during the daytime (such as falling asleep during driving or talking to someone)? 0  Has anyone observed you stop breathing during your sleep? 1  Do you have, or are you being treated for high blood pressure? 1  BMI more than 35 kg/m2? 0  Age > 50 (1-yes) 1  Neck circumference greater than:Male 16 inches or larger, Male 17inches or larger?  (assess dos)  Male Gender (Yes=1) 1  Obstructive Sleep Apnea Score 5
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
## 2019-12-02 NOTE — Progress Notes (Signed)
Pt denies any acute cardiopulmonary issues. Pt stated that he is under the care of Dr. Virgina Jock, Cardiology and Dr. Donavan Burnet, PCP. Larene Beach, CMA, clarified that pt is to stop Eliquis 3 days prior to surgery ( pt was previously instructed).  Pt made aware to stop taking vitamins, fish oil and herbal medications. Do not take any NSAIDs ie: Ibuprofen, Advil, Naproxen (Aleve), Motrin, BC and Goody Powder. Pt stated that a stress test was attempted but not completed in [place].  Pt made aware to check BG every 2 hours prior to arrival to hospital on DOS. Pt made aware to treat a CBG < 70 with 4 glucose tabs wait 15 minutes after intervention to recheck CBG, if CBG remains < 70, call Short Stay unit to speak with a nurse. Pt made aware that if CBG is > 220 to take 1/2 of correction dose of Novolog insulin.  Pt reminded to quarantine. Pt verbalized understanding of all pre-op instructions. PA, Anesthesiology, asked to review pt history.

## 2019-12-03 ENCOUNTER — Ambulatory Visit: Payer: BC Managed Care – PPO | Admitting: Vascular Surgery

## 2019-12-04 ENCOUNTER — Other Ambulatory Visit (HOSPITAL_COMMUNITY): Payer: BC Managed Care – PPO

## 2019-12-04 ENCOUNTER — Other Ambulatory Visit (HOSPITAL_COMMUNITY): Payer: Self-pay

## 2019-12-05 ENCOUNTER — Other Ambulatory Visit (HOSPITAL_COMMUNITY)
Admission: RE | Admit: 2019-12-05 | Discharge: 2019-12-05 | Disposition: A | Discharge status: Home / Self Care | Payer: BC Managed Care – PPO | Source: Ambulatory Visit | Attending: Vascular Surgery | Admitting: Vascular Surgery

## 2019-12-05 DIAGNOSIS — Z01812 Encounter for preprocedural laboratory examination: Secondary | ICD-10-CM | POA: Insufficient documentation

## 2019-12-05 DIAGNOSIS — Z20822 Contact with and (suspected) exposure to covid-19: Secondary | ICD-10-CM | POA: Insufficient documentation

## 2019-12-05 LAB — SARS CORONAVIRUS 2 (TAT 6-24 HRS): SARS Coronavirus 2: NEGATIVE

## 2019-12-07 ENCOUNTER — Ambulatory Visit (HOSPITAL_COMMUNITY): Payer: BC Managed Care – PPO

## 2019-12-07 ENCOUNTER — Inpatient Hospital Stay (HOSPITAL_COMMUNITY): Payer: BC Managed Care – PPO

## 2019-12-07 ENCOUNTER — Inpatient Hospital Stay (HOSPITAL_COMMUNITY): Payer: BC Managed Care – PPO | Admitting: Anesthesiology

## 2019-12-07 ENCOUNTER — Other Ambulatory Visit: Payer: Self-pay

## 2019-12-07 ENCOUNTER — Inpatient Hospital Stay (HOSPITAL_COMMUNITY)
Admission: RE | Admit: 2019-12-07 | Discharge: 2019-12-11 | DRG: 673 | Disposition: A | Discharge status: Home Health | Payer: BC Managed Care – PPO | Attending: Vascular Surgery | Admitting: Vascular Surgery

## 2019-12-07 ENCOUNTER — Ambulatory Visit (HOSPITAL_COMMUNITY): Payer: BC Managed Care – PPO | Admitting: Vascular Surgery

## 2019-12-07 ENCOUNTER — Encounter (HOSPITAL_COMMUNITY): Payer: Self-pay | Admitting: Vascular Surgery

## 2019-12-07 ENCOUNTER — Encounter (HOSPITAL_COMMUNITY)
Admission: RE | Disposition: A | Discharge status: Home Health | Payer: Self-pay | Source: Home / Self Care | Attending: Vascular Surgery

## 2019-12-07 ENCOUNTER — Telehealth: Payer: Self-pay | Admitting: Hematology

## 2019-12-07 DIAGNOSIS — J988 Other specified respiratory disorders: Secondary | ICD-10-CM

## 2019-12-07 DIAGNOSIS — N185 Chronic kidney disease, stage 5: Secondary | ICD-10-CM

## 2019-12-07 DIAGNOSIS — I959 Hypotension, unspecified: Secondary | ICD-10-CM | POA: Diagnosis not present

## 2019-12-07 DIAGNOSIS — I4819 Other persistent atrial fibrillation: Secondary | ICD-10-CM | POA: Diagnosis not present

## 2019-12-07 DIAGNOSIS — E1122 Type 2 diabetes mellitus with diabetic chronic kidney disease: Principal | ICD-10-CM | POA: Diagnosis present

## 2019-12-07 DIAGNOSIS — Z87891 Personal history of nicotine dependence: Secondary | ICD-10-CM

## 2019-12-07 DIAGNOSIS — D509 Iron deficiency anemia, unspecified: Secondary | ICD-10-CM | POA: Diagnosis present

## 2019-12-07 DIAGNOSIS — J302 Other seasonal allergic rhinitis: Secondary | ICD-10-CM | POA: Diagnosis present

## 2019-12-07 DIAGNOSIS — Z9841 Cataract extraction status, right eye: Secondary | ICD-10-CM

## 2019-12-07 DIAGNOSIS — G9341 Metabolic encephalopathy: Secondary | ICD-10-CM

## 2019-12-07 DIAGNOSIS — N186 End stage renal disease: Secondary | ICD-10-CM | POA: Diagnosis present

## 2019-12-07 DIAGNOSIS — Z96651 Presence of right artificial knee joint: Secondary | ICD-10-CM | POA: Diagnosis present

## 2019-12-07 DIAGNOSIS — Z833 Family history of diabetes mellitus: Secondary | ICD-10-CM

## 2019-12-07 DIAGNOSIS — D631 Anemia in chronic kidney disease: Secondary | ICD-10-CM | POA: Diagnosis present

## 2019-12-07 DIAGNOSIS — G92 Toxic encephalopathy: Secondary | ICD-10-CM | POA: Diagnosis not present

## 2019-12-07 DIAGNOSIS — Z79899 Other long term (current) drug therapy: Secondary | ICD-10-CM

## 2019-12-07 DIAGNOSIS — R29818 Other symptoms and signs involving the nervous system: Secondary | ICD-10-CM | POA: Diagnosis not present

## 2019-12-07 DIAGNOSIS — G9340 Encephalopathy, unspecified: Secondary | ICD-10-CM | POA: Diagnosis not present

## 2019-12-07 DIAGNOSIS — Z8349 Family history of other endocrine, nutritional and metabolic diseases: Secondary | ICD-10-CM

## 2019-12-07 DIAGNOSIS — J9690 Respiratory failure, unspecified, unspecified whether with hypoxia or hypercapnia: Secondary | ICD-10-CM | POA: Diagnosis present

## 2019-12-07 DIAGNOSIS — E113592 Type 2 diabetes mellitus with proliferative diabetic retinopathy without macular edema, left eye: Secondary | ICD-10-CM | POA: Diagnosis present

## 2019-12-07 DIAGNOSIS — J9601 Acute respiratory failure with hypoxia: Secondary | ICD-10-CM | POA: Diagnosis not present

## 2019-12-07 DIAGNOSIS — T411X5A Adverse effect of intravenous anesthetics, initial encounter: Secondary | ICD-10-CM | POA: Diagnosis not present

## 2019-12-07 DIAGNOSIS — I4821 Permanent atrial fibrillation: Secondary | ICD-10-CM | POA: Diagnosis present

## 2019-12-07 DIAGNOSIS — Z800 Family history of malignant neoplasm of digestive organs: Secondary | ICD-10-CM

## 2019-12-07 DIAGNOSIS — Y832 Surgical operation with anastomosis, bypass or graft as the cause of abnormal reaction of the patient, or of later complication, without mention of misadventure at the time of the procedure: Secondary | ICD-10-CM | POA: Diagnosis not present

## 2019-12-07 DIAGNOSIS — I132 Hypertensive heart and chronic kidney disease with heart failure and with stage 5 chronic kidney disease, or end stage renal disease: Secondary | ICD-10-CM | POA: Diagnosis present

## 2019-12-07 DIAGNOSIS — Y712 Prosthetic and other implants, materials and accessory cardiovascular devices associated with adverse incidents: Secondary | ICD-10-CM | POA: Diagnosis not present

## 2019-12-07 DIAGNOSIS — H409 Unspecified glaucoma: Secondary | ICD-10-CM | POA: Diagnosis present

## 2019-12-07 DIAGNOSIS — K219 Gastro-esophageal reflux disease without esophagitis: Secondary | ICD-10-CM | POA: Diagnosis present

## 2019-12-07 DIAGNOSIS — E1165 Type 2 diabetes mellitus with hyperglycemia: Secondary | ICD-10-CM | POA: Diagnosis present

## 2019-12-07 DIAGNOSIS — I2510 Atherosclerotic heart disease of native coronary artery without angina pectoris: Secondary | ICD-10-CM | POA: Diagnosis present

## 2019-12-07 DIAGNOSIS — M797 Fibromyalgia: Secondary | ICD-10-CM | POA: Diagnosis present

## 2019-12-07 DIAGNOSIS — I252 Old myocardial infarction: Secondary | ICD-10-CM | POA: Diagnosis not present

## 2019-12-07 DIAGNOSIS — R410 Disorientation, unspecified: Secondary | ICD-10-CM

## 2019-12-07 DIAGNOSIS — Y92230 Patient room in hospital as the place of occurrence of the external cause: Secondary | ICD-10-CM | POA: Diagnosis not present

## 2019-12-07 DIAGNOSIS — E1142 Type 2 diabetes mellitus with diabetic polyneuropathy: Secondary | ICD-10-CM | POA: Diagnosis present

## 2019-12-07 DIAGNOSIS — M810 Age-related osteoporosis without current pathological fracture: Secondary | ICD-10-CM | POA: Diagnosis present

## 2019-12-07 DIAGNOSIS — I472 Ventricular tachycardia: Secondary | ICD-10-CM | POA: Diagnosis not present

## 2019-12-07 DIAGNOSIS — Z8249 Family history of ischemic heart disease and other diseases of the circulatory system: Secondary | ICD-10-CM

## 2019-12-07 DIAGNOSIS — E785 Hyperlipidemia, unspecified: Secondary | ICD-10-CM | POA: Diagnosis present

## 2019-12-07 DIAGNOSIS — J9602 Acute respiratory failure with hypercapnia: Secondary | ICD-10-CM | POA: Diagnosis not present

## 2019-12-07 DIAGNOSIS — N170 Acute kidney failure with tubular necrosis: Secondary | ICD-10-CM | POA: Diagnosis not present

## 2019-12-07 DIAGNOSIS — Z9889 Other specified postprocedural states: Secondary | ICD-10-CM

## 2019-12-07 DIAGNOSIS — I5032 Chronic diastolic (congestive) heart failure: Secondary | ICD-10-CM | POA: Diagnosis present

## 2019-12-07 DIAGNOSIS — N189 Chronic kidney disease, unspecified: Secondary | ICD-10-CM

## 2019-12-07 DIAGNOSIS — Z7901 Long term (current) use of anticoagulants: Secondary | ICD-10-CM

## 2019-12-07 DIAGNOSIS — R4182 Altered mental status, unspecified: Secondary | ICD-10-CM

## 2019-12-07 DIAGNOSIS — Z955 Presence of coronary angioplasty implant and graft: Secondary | ICD-10-CM

## 2019-12-07 DIAGNOSIS — Z794 Long term (current) use of insulin: Secondary | ICD-10-CM

## 2019-12-07 DIAGNOSIS — L7634 Postprocedural seroma of skin and subcutaneous tissue following other procedure: Secondary | ICD-10-CM | POA: Diagnosis not present

## 2019-12-07 DIAGNOSIS — N184 Chronic kidney disease, stage 4 (severe): Secondary | ICD-10-CM | POA: Diagnosis not present

## 2019-12-07 HISTORY — PX: RADIOLOGY WITH ANESTHESIA: SHX6223

## 2019-12-07 HISTORY — PX: BASCILIC VEIN TRANSPOSITION: SHX5742

## 2019-12-07 LAB — FERRITIN: Ferritin: 98 ng/mL (ref 24–336)

## 2019-12-07 LAB — BASIC METABOLIC PANEL
Anion gap: 10 (ref 5–15)
BUN: 28 mg/dL — ABNORMAL HIGH (ref 8–23)
CO2: 21 mmol/L — ABNORMAL LOW (ref 22–32)
Calcium: 7.6 mg/dL — ABNORMAL LOW (ref 8.9–10.3)
Chloride: 105 mmol/L (ref 98–111)
Creatinine, Ser: 3 mg/dL — ABNORMAL HIGH (ref 0.61–1.24)
GFR calc Af Amer: 23 mL/min — ABNORMAL LOW (ref >60)
GFR calc non Af Amer: 20 mL/min — ABNORMAL LOW (ref >60)
Glucose, Bld: 161 mg/dL — ABNORMAL HIGH (ref 70–99)
Potassium: 4.2 mmol/L (ref 3.5–5.1)
Sodium: 136 mmol/L (ref 135–145)

## 2019-12-07 LAB — IRON AND TIBC
Iron: 32 ug/dL — ABNORMAL LOW (ref 45–182)
Saturation Ratios: 14 % — ABNORMAL LOW (ref 17.9–39.5)
TIBC: 225 ug/dL — ABNORMAL LOW (ref 250–450)
UIBC: 193 ug/dL

## 2019-12-07 LAB — POCT I-STAT 7, (LYTES, BLD GAS, ICA,H+H)
Bicarbonate: 24.3 mmol/L (ref 20.0–28.0)
Bicarbonate: 25.1 mmol/L (ref 20.0–28.0)
Calcium, Ion: 1.09 mmol/L — ABNORMAL LOW (ref 1.15–1.40)
Calcium, Ion: 1.08 mmol/L — ABNORMAL LOW (ref 1.15–1.40)
HCT: 24 % — ABNORMAL LOW (ref 39.0–52.0)
HCT: 23 % — ABNORMAL LOW (ref 39.0–52.0)
Hemoglobin: 8.2 g/dL — ABNORMAL LOW (ref 13.0–17.0)
Hemoglobin: 7.8 g/dL — ABNORMAL LOW (ref 13.0–17.0)
O2 Saturation: 93 %
O2 Saturation: 92 %
Patient temperature: 36.8
Patient temperature: 98.1
Potassium: 4.4 mmol/L (ref 3.5–5.1)
Potassium: 4.4 mmol/L (ref 3.5–5.1)
Sodium: 142 mmol/L (ref 135–145)
Sodium: 141 mmol/L (ref 135–145)
TCO2: 25 mmol/L (ref 22–32)
TCO2: 26 mmol/L (ref 22–32)
pCO2 arterial: 37 mmHg (ref 32.0–48.0)
pCO2 arterial: 41.7 mmHg (ref 32.0–48.0)
pH, Arterial: 7.425 (ref 7.350–7.450)
pH, Arterial: 7.387 (ref 7.350–7.450)
pO2, Arterial: 63 mmHg — ABNORMAL LOW (ref 83.0–108.0)
pO2, Arterial: 65 mmHg — ABNORMAL LOW (ref 83.0–108.0)

## 2019-12-07 LAB — HEPATIC FUNCTION PANEL
ALT: 16 U/L (ref 0–44)
AST: 18 U/L (ref 15–41)
Albumin: 2.4 g/dL — ABNORMAL LOW (ref 3.5–5.0)
Alkaline Phosphatase: 116 U/L (ref 38–126)
Bilirubin, Direct: 0.2 mg/dL (ref 0.0–0.2)
Indirect Bilirubin: 0.6 mg/dL (ref 0.3–0.9)
Total Bilirubin: 0.8 mg/dL (ref 0.3–1.2)
Total Protein: 5.9 g/dL — ABNORMAL LOW (ref 6.5–8.1)

## 2019-12-07 LAB — GLUCOSE, CAPILLARY
Glucose-Capillary: 149 mg/dL — ABNORMAL HIGH (ref 70–99)
Glucose-Capillary: 177 mg/dL — ABNORMAL HIGH (ref 70–99)
Glucose-Capillary: 156 mg/dL — ABNORMAL HIGH (ref 70–99)
Glucose-Capillary: 149 mg/dL — ABNORMAL HIGH (ref 70–99)
Glucose-Capillary: 147 mg/dL — ABNORMAL HIGH (ref 70–99)
Glucose-Capillary: 131 mg/dL — ABNORMAL HIGH (ref 70–99)
Glucose-Capillary: 92 mg/dL (ref 70–99)

## 2019-12-07 LAB — AMMONIA: Ammonia: 20 umol/L (ref 9–35)

## 2019-12-07 LAB — TROPONIN I (HIGH SENSITIVITY)
Troponin I (High Sensitivity): 33 ng/L — ABNORMAL HIGH (ref <18)
Troponin I (High Sensitivity): 29 ng/L — ABNORMAL HIGH (ref <18)

## 2019-12-07 LAB — POCT I-STAT, CHEM 8
BUN: 27 mg/dL — ABNORMAL HIGH (ref 8–23)
Calcium, Ion: 0.85 mmol/L — CL (ref 1.15–1.40)
Chloride: 110 mmol/L (ref 98–111)
Creatinine, Ser: 2.8 mg/dL — ABNORMAL HIGH (ref 0.61–1.24)
Glucose, Bld: 153 mg/dL — ABNORMAL HIGH (ref 70–99)
HCT: 23 % — ABNORMAL LOW (ref 39.0–52.0)
Hemoglobin: 7.8 g/dL — ABNORMAL LOW (ref 13.0–17.0)
Potassium: 4 mmol/L (ref 3.5–5.1)
Sodium: 139 mmol/L (ref 135–145)
TCO2: 23 mmol/L (ref 22–32)

## 2019-12-07 LAB — CBC
HCT: 24.5 % — ABNORMAL LOW (ref 39.0–52.0)
Hemoglobin: 7.4 g/dL — ABNORMAL LOW (ref 13.0–17.0)
MCH: 27.1 pg (ref 26.0–34.0)
MCHC: 30.2 g/dL (ref 30.0–36.0)
MCV: 89.7 fL (ref 80.0–100.0)
Platelets: 165 10*3/uL (ref 150–400)
RBC: 2.73 MIL/uL — ABNORMAL LOW (ref 4.22–5.81)
RDW: 16.2 % — ABNORMAL HIGH (ref 11.5–15.5)
WBC: 5 10*3/uL (ref 4.0–10.5)
nRBC: 0 % (ref 0.0–0.2)

## 2019-12-07 LAB — CREATININE, SERUM
Creatinine, Ser: 2.92 mg/dL — ABNORMAL HIGH (ref 0.61–1.24)
GFR calc Af Amer: 24 mL/min — ABNORMAL LOW (ref >60)
GFR calc non Af Amer: 21 mL/min — ABNORMAL LOW (ref >60)

## 2019-12-07 LAB — MRSA PCR SCREENING: MRSA by PCR: NEGATIVE

## 2019-12-07 LAB — TSH: TSH: 3.589 u[IU]/mL (ref 0.350–4.500)

## 2019-12-07 SURGERY — TRANSPOSITION, VEIN, BASILIC
Anesthesia: General | Site: Arm Upper

## 2019-12-07 SURGERY — IR WITH ANESTHESIA
Anesthesia: General

## 2019-12-07 MED ORDER — CEFAZOLIN SODIUM-DEXTROSE 2-4 GM/100ML-% IV SOLN
2.0000 g | INTRAVENOUS | Status: AC
  Administered 2019-12-07: 2 g via INTRAVENOUS
  Filled 2019-12-07: qty 100

## 2019-12-07 MED ORDER — HEPARIN SODIUM (PORCINE) 1000 UNIT/ML IJ SOLN
INTRAMUSCULAR | Status: AC
  Filled 2019-12-07: qty 1

## 2019-12-07 MED ORDER — ALBUMIN HUMAN 5 % IV SOLN: INTRAVENOUS | Status: DC | PRN

## 2019-12-07 MED ORDER — LIDOCAINE 2% (20 MG/ML) 5 ML SYRINGE
INTRAMUSCULAR | Status: DC | PRN
  Administered 2019-12-07: 60 mg via INTRAVENOUS

## 2019-12-07 MED ORDER — PROPOFOL 10 MG/ML IV BOLUS
INTRAVENOUS | Status: DC | PRN
  Administered 2019-12-07 (×9): 20 mg via INTRAVENOUS

## 2019-12-07 MED ORDER — ONDANSETRON HCL 4 MG/2ML IJ SOLN
INTRAMUSCULAR | Status: DC | PRN
  Administered 2019-12-07: 4 mg via INTRAVENOUS

## 2019-12-07 MED ORDER — FENTANYL CITRATE (PF) 250 MCG/5ML IJ SOLN
INTRAMUSCULAR | Status: AC
  Filled 2019-12-07: qty 5

## 2019-12-07 MED ORDER — INSULIN ASPART 100 UNIT/ML ~~LOC~~ SOLN
0.0000 [IU] | SUBCUTANEOUS | Status: DC
  Administered 2019-12-07 (×2): 1 [IU] via SUBCUTANEOUS

## 2019-12-07 MED ORDER — PHENYLEPHRINE 40 MCG/ML (10ML) SYRINGE FOR IV PUSH (FOR BLOOD PRESSURE SUPPORT)
PREFILLED_SYRINGE | INTRAVENOUS | Status: AC
  Filled 2019-12-07: qty 10

## 2019-12-07 MED ORDER — EPHEDRINE 5 MG/ML INJ
INTRAVENOUS | Status: AC
  Filled 2019-12-07: qty 10

## 2019-12-07 MED ORDER — PROTAMINE SULFATE 10 MG/ML IV SOLN
INTRAVENOUS | Status: DC | PRN
  Administered 2019-12-07 (×5): 10 mg via INTRAVENOUS

## 2019-12-07 MED ORDER — SODIUM CHLORIDE 0.9 % IV SOLN
250.0000 mL | INTRAVENOUS | Status: DC | PRN
  Administered 2019-12-11: 250 mL via INTRAVENOUS

## 2019-12-07 MED ORDER — PHENYLEPHRINE HCL-NACL 10-0.9 MG/250ML-% IV SOLN
INTRAVENOUS | Status: DC | PRN
  Administered 2019-12-07: 25 ug/min via INTRAVENOUS

## 2019-12-07 MED ORDER — PROPOFOL 500 MG/50ML IV EMUL
INTRAVENOUS | Status: DC | PRN
  Administered 2019-12-07: 25 ug/kg/min via INTRAVENOUS

## 2019-12-07 MED ORDER — ONDANSETRON HCL 4 MG/2ML IJ SOLN
INTRAMUSCULAR | Status: AC
  Filled 2019-12-07: qty 2

## 2019-12-07 MED ORDER — SODIUM CHLORIDE 0.9% FLUSH
3.0000 mL | INTRAVENOUS | Status: DC | PRN
  Administered 2019-12-07: 22:00:00 3 mL via INTRAVENOUS

## 2019-12-07 MED ORDER — VASOPRESSIN 20 UNIT/ML IV SOLN
INTRAVENOUS | Status: DC | PRN
  Administered 2019-12-07: 2 [IU] via INTRAVENOUS
  Administered 2019-12-07 (×2): 1 [IU] via INTRAVENOUS

## 2019-12-07 MED ORDER — 0.9 % SODIUM CHLORIDE (POUR BTL) OPTIME
TOPICAL | Status: DC | PRN
  Administered 2019-12-07: 08:00:00 1000 mL

## 2019-12-07 MED ORDER — PROPOFOL 10 MG/ML IV BOLUS
INTRAVENOUS | Status: DC | PRN
  Administered 2019-12-07: 110 mg via INTRAVENOUS
  Administered 2019-12-07: 30 mg via INTRAVENOUS
  Administered 2019-12-07 (×3): 20 mg via INTRAVENOUS

## 2019-12-07 MED ORDER — SODIUM CHLORIDE 0.9 % IV SOLN
INTRAVENOUS | Status: DC | PRN
  Administered 2019-12-07: 500 mL

## 2019-12-07 MED ORDER — MIDAZOLAM HCL 2 MG/2ML IJ SOLN
1.0000 mg | INTRAMUSCULAR | Status: DC | PRN
  Administered 2019-12-08: 1 mg via INTRAVENOUS
  Filled 2019-12-07: qty 2

## 2019-12-07 MED ORDER — ORAL CARE MOUTH RINSE
15.0000 mL | OROMUCOSAL | Status: DC
  Administered 2019-12-07 – 2019-12-08 (×7): 15 mL via OROMUCOSAL

## 2019-12-07 MED ORDER — PHENYLEPHRINE HCL (PRESSORS) 10 MG/ML IV SOLN
INTRAVENOUS | Status: DC | PRN
  Administered 2019-12-07: 50 ug via INTRAVENOUS
  Administered 2019-12-07: 150 ug via INTRAVENOUS
  Administered 2019-12-07 (×2): 120 ug via INTRAVENOUS

## 2019-12-07 MED ORDER — PROPOFOL 10 MG/ML IV BOLUS
INTRAVENOUS | Status: AC
  Filled 2019-12-07: qty 20

## 2019-12-07 MED ORDER — CHLORHEXIDINE GLUCONATE 0.12% ORAL RINSE (MEDLINE KIT)
15.0000 mL | Freq: Two times a day (BID) | OROMUCOSAL | Status: DC
  Administered 2019-12-07 – 2019-12-08 (×2): 15 mL via OROMUCOSAL

## 2019-12-07 MED ORDER — SODIUM CHLORIDE 0.9 % IV SOLN
INTRAVENOUS | Status: AC
  Filled 2019-12-07: qty 1.2

## 2019-12-07 MED ORDER — SODIUM CHLORIDE 0.9% FLUSH
3.0000 mL | Freq: Two times a day (BID) | INTRAVENOUS | Status: DC
  Administered 2019-12-08 – 2019-12-11 (×7): 3 mL via INTRAVENOUS

## 2019-12-07 MED ORDER — LORAZEPAM 2 MG/ML IJ SOLN
INTRAMUSCULAR | Status: AC
  Filled 2019-12-07: qty 1

## 2019-12-07 MED ORDER — PROPOFOL 1000 MG/100ML IV EMUL
0.0000 ug/kg/min | INTRAVENOUS | Status: DC
  Administered 2019-12-07: 18:00:00 25 ug/kg/min via INTRAVENOUS
  Administered 2019-12-08: 06:00:00 40 ug/kg/min via INTRAVENOUS
  Administered 2019-12-08: 02:00:00 20 ug/kg/min via INTRAVENOUS
  Filled 2019-12-07 (×3): qty 100

## 2019-12-07 MED ORDER — LIDOCAINE 2% (20 MG/ML) 5 ML SYRINGE
INTRAMUSCULAR | Status: AC
  Filled 2019-12-07: qty 5

## 2019-12-07 MED ORDER — EPHEDRINE SULFATE 50 MG/ML IJ SOLN
INTRAMUSCULAR | Status: DC | PRN
  Administered 2019-12-07: 10 mg via INTRAVENOUS

## 2019-12-07 MED ORDER — VASOPRESSIN 20 UNIT/ML IV SOLN
INTRAVENOUS | Status: AC
  Filled 2019-12-07: qty 1

## 2019-12-07 MED ORDER — HALOPERIDOL LACTATE 5 MG/ML IJ SOLN
INTRAMUSCULAR | Status: AC
  Filled 2019-12-07: qty 1

## 2019-12-07 MED ORDER — HYDRALAZINE HCL 20 MG/ML IJ SOLN
10.0000 mg | INTRAMUSCULAR | Status: DC | PRN
  Administered 2019-12-07: 10 mg via INTRAVENOUS
  Administered 2019-12-07 – 2019-12-10 (×5): 20 mg via INTRAVENOUS
  Filled 2019-12-07 (×6): qty 1

## 2019-12-07 MED ORDER — LIDOCAINE HCL (PF) 1 % IJ SOLN
INTRAMUSCULAR | Status: AC
  Filled 2019-12-07: qty 30

## 2019-12-07 MED ORDER — SUCCINYLCHOLINE CHLORIDE 20 MG/ML IJ SOLN
INTRAMUSCULAR | Status: DC | PRN
  Administered 2019-12-07: 120 mg via INTRAVENOUS

## 2019-12-07 MED ORDER — CHLORHEXIDINE GLUCONATE 4 % EX LIQD: 60.0000 mL | Freq: Once | CUTANEOUS | Status: DC

## 2019-12-07 MED ORDER — FENTANYL CITRATE (PF) 100 MCG/2ML IJ SOLN
INTRAMUSCULAR | Status: DC | PRN
  Administered 2019-12-07 (×5): 50 ug via INTRAVENOUS

## 2019-12-07 MED ORDER — ATROPINE SULFATE 0.4 MG/ML IJ SOLN
INTRAMUSCULAR | Status: DC | PRN
  Administered 2019-12-07 (×2): .2 mg via INTRAVENOUS

## 2019-12-07 MED ORDER — ALBUTEROL SULFATE HFA 108 (90 BASE) MCG/ACT IN AERS
INHALATION_SPRAY | RESPIRATORY_TRACT | Status: DC | PRN
  Administered 2019-12-07: 2 via RESPIRATORY_TRACT

## 2019-12-07 MED ORDER — HEPARIN SODIUM (PORCINE) 5000 UNIT/ML IJ SOLN
5000.0000 [IU] | Freq: Three times a day (TID) | INTRAMUSCULAR | Status: DC
  Administered 2019-12-07 – 2019-12-08 (×3): 5000 [IU] via SUBCUTANEOUS
  Filled 2019-12-07 (×3): qty 1

## 2019-12-07 MED ORDER — CHLORHEXIDINE GLUCONATE CLOTH 2 % EX PADS
6.0000 | MEDICATED_PAD | Freq: Every day | CUTANEOUS | Status: DC
  Administered 2019-12-07 – 2019-12-10 (×4): 6 via TOPICAL

## 2019-12-07 MED ORDER — SODIUM CHLORIDE 0.9 % IV SOLN: INTRAVENOUS | Status: DC

## 2019-12-07 MED ORDER — PROTAMINE SULFATE 10 MG/ML IV SOLN
INTRAVENOUS | Status: AC
  Filled 2019-12-07: qty 5

## 2019-12-07 MED ORDER — FENTANYL CITRATE (PF) 100 MCG/2ML IJ SOLN
25.0000 ug | INTRAMUSCULAR | Status: DC | PRN
  Administered 2019-12-08: 100 ug via INTRAVENOUS
  Filled 2019-12-07: qty 2

## 2019-12-07 MED ORDER — HEPARIN SODIUM (PORCINE) 1000 UNIT/ML IJ SOLN
INTRAMUSCULAR | Status: DC | PRN
  Administered 2019-12-07: 5000 [IU] via INTRAVENOUS

## 2019-12-07 MED ORDER — PANTOPRAZOLE SODIUM 40 MG IV SOLR
40.0000 mg | Freq: Every day | INTRAVENOUS | Status: AC
  Administered 2019-12-07 – 2019-12-09 (×3): 40 mg via INTRAVENOUS
  Filled 2019-12-07 (×3): qty 40

## 2019-12-07 MED ORDER — HYDROCODONE-ACETAMINOPHEN 5-325 MG PO TABS: 1.0000 | ORAL_TABLET | Freq: Four times a day (QID) | ORAL | 0 refills | Status: DC | PRN

## 2019-12-07 SURGICAL SUPPLY — 65 items
ARMBAND PINK RESTRICT EXTREMIT (MISCELLANEOUS) ×3 IMPLANT
BAG DECANTER FOR FLEXI CONT (MISCELLANEOUS) IMPLANT
BIOPATCH RED 1 DISK 7.0 (GAUZE/BANDAGES/DRESSINGS) ×3 IMPLANT
CANISTER SUCT 3000ML PPV (MISCELLANEOUS) ×3 IMPLANT
CANNULA VESSEL 3MM 2 BLNT TIP (CANNULA) ×6 IMPLANT
CATH PALINDROME RT-P 15FX19CM (CATHETERS) IMPLANT
CATH PALINDROME RT-P 15FX23CM (CATHETERS) IMPLANT
CATH PALINDROME RT-P 15FX28CM (CATHETERS) IMPLANT
CATH PALINDROME RT-P 15FX55CM (CATHETERS) IMPLANT
CATH STRAIGHT 5FR 65CM (CATHETERS) IMPLANT
CHLORAPREP W/TINT 26 (MISCELLANEOUS) ×3 IMPLANT
CLIP VESOCCLUDE MED 24/CT (CLIP) ×3 IMPLANT
CLIP VESOCCLUDE SM WIDE 24/CT (CLIP) ×3 IMPLANT
COVER PROBE W GEL 5X96 (DRAPES) ×3 IMPLANT
COVER SURGICAL LIGHT HANDLE (MISCELLANEOUS) ×3 IMPLANT
COVER WAND RF STERILE (DRAPES) IMPLANT
DECANTER SPIKE VIAL GLASS SM (MISCELLANEOUS) ×3 IMPLANT
DERMABOND ADVANCED (GAUZE/BANDAGES/DRESSINGS) ×2
DERMABOND ADVANCED .7 DNX12 (GAUZE/BANDAGES/DRESSINGS) ×4 IMPLANT
DRAIN PENROSE 1/4X12 LTX STRL (WOUND CARE) IMPLANT
DRAPE C-ARM 42X72 X-RAY (DRAPES) ×3 IMPLANT
DRAPE CHEST BREAST 15X10 FENES (DRAPES) ×3 IMPLANT
ELECT REM PT RETURN 9FT ADLT (ELECTROSURGICAL) ×3
ELECTRODE REM PT RTRN 9FT ADLT (ELECTROSURGICAL) ×2 IMPLANT
GAUZE 4X4 16PLY RFD (DISPOSABLE) ×3 IMPLANT
GAUZE SPONGE 4X4 16PLY XRAY LF (GAUZE/BANDAGES/DRESSINGS) ×3 IMPLANT
GLOVE BIO SURGEON STRL SZ 6 (GLOVE) ×6 IMPLANT
GLOVE BIO SURGEON STRL SZ 6.5 (GLOVE) ×3 IMPLANT
GLOVE BIO SURGEON STRL SZ7.5 (GLOVE) ×6 IMPLANT
GLOVE BIOGEL PI IND STRL 6 (GLOVE) ×2 IMPLANT
GLOVE BIOGEL PI IND STRL 6.5 (GLOVE) ×4 IMPLANT
GLOVE BIOGEL PI IND STRL 7.0 (GLOVE) ×2 IMPLANT
GLOVE BIOGEL PI INDICATOR 6 (GLOVE) ×1
GLOVE BIOGEL PI INDICATOR 6.5 (GLOVE) ×2
GLOVE BIOGEL PI INDICATOR 7.0 (GLOVE) ×1
GOWN STRL REUS W/ TWL LRG LVL3 (GOWN DISPOSABLE) ×10 IMPLANT
GOWN STRL REUS W/TWL LRG LVL3 (GOWN DISPOSABLE) ×5
HEMOSTAT SPONGE AVITENE ULTRA (HEMOSTASIS) IMPLANT
KIT BASIN OR (CUSTOM PROCEDURE TRAY) ×3 IMPLANT
KIT TURNOVER KIT B (KITS) ×3 IMPLANT
LOOP VESSEL MINI RED (MISCELLANEOUS) IMPLANT
NEEDLE 18GX1X1/2 (RX/OR ONLY) (NEEDLE) ×3 IMPLANT
NEEDLE HYPO 25GX1X1/2 BEV (NEEDLE) ×3 IMPLANT
NS IRRIG 1000ML POUR BTL (IV SOLUTION) ×3 IMPLANT
PACK CV ACCESS (CUSTOM PROCEDURE TRAY) ×3 IMPLANT
PACK SURGICAL SETUP 50X90 (CUSTOM PROCEDURE TRAY) ×3 IMPLANT
PAD ARMBOARD 7.5X6 YLW CONV (MISCELLANEOUS) ×6 IMPLANT
SET MICROPUNCTURE 5F STIFF (MISCELLANEOUS) IMPLANT
SUT ETHILON 3 0 PS 1 (SUTURE) ×3 IMPLANT
SUT PROLENE 6 0 CC (SUTURE) ×6 IMPLANT
SUT PROLENE 7 0 BV 1 (SUTURE) ×3 IMPLANT
SUT SILK 2 0 SH (SUTURE) ×3 IMPLANT
SUT VIC AB 3-0 SH 27 (SUTURE) ×3
SUT VIC AB 3-0 SH 27X BRD (SUTURE) ×6 IMPLANT
SUT VICRYL 4-0 PS2 18IN ABS (SUTURE) ×15 IMPLANT
SYR 10ML LL (SYRINGE) ×3 IMPLANT
SYR 20ML LL LF (SYRINGE) ×6 IMPLANT
SYR 5ML LL (SYRINGE) ×3 IMPLANT
SYR CONTROL 10ML LL (SYRINGE) ×3 IMPLANT
TAPE UMBILICAL COTTON 1/8X30 (MISCELLANEOUS) ×3 IMPLANT
TOWEL GREEN STERILE (TOWEL DISPOSABLE) ×3 IMPLANT
TOWEL GREEN STERILE FF (TOWEL DISPOSABLE) ×3 IMPLANT
UNDERPAD 30X30 (UNDERPADS AND DIAPERS) ×3 IMPLANT
WATER STERILE IRR 1000ML POUR (IV SOLUTION) ×3 IMPLANT
WIRE AMPLATZ SS-J .035X180CM (WIRE) IMPLANT

## 2019-12-07 NOTE — Progress Notes (Signed)
Updated pt wife Reino Bellis and daughter [place] [phone].  They stated they are his medical decision makers.  I will update them at the primary family communication and they will update pt sister and brother.   Ruta Hinds, MD [facility] Office: [phone]
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
## 2019-12-07 NOTE — Transfer of Care (Signed)
Immediate Anesthesia Transfer of Care Note  Patient: William Webb [facility]  Procedure(s) Performed: [place] LEFT ARM (Left Arm Upper)  Patient Location: PACU  Anesthesia Type:General  Level of Consciousness: drowsy  Airway & Oxygen Therapy: Patient Spontanous Breathing and Patient connected to face mask oxygen  Post-op Assessment: Report given to RN and Post -op Vital signs reviewed and stable  Post vital signs: Reviewed and stable  Last Vitals:  Vitals Value Taken Time  BP 160/81 12/07/19 1108  Temp    Pulse 65 12/07/19 1115  Resp 19 12/07/19 1115  SpO2 91 % 12/07/19 1115  Vitals shown include unvalidated device data.  Last Pain:  Vitals:   12/07/19 1108  PainSc: (P) Asleep      Patients Stated Pain Goal: 4 (06/77/03 4035)  Complications: No apparent anesthesia complications

## 2019-12-07 NOTE — Telephone Encounter (Signed)
Patients voicemail has not been setup I will mail schedule
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
## 2019-12-07 NOTE — Progress Notes (Signed)
Pt not following commands in PACU.  A code stroke was called.  Head CT was negative.  Pt to MRI and now IR.    I called pt sister Stevan Born [phone] as well as his brother Vivian Okelley [phone] and updated her that pt has probably had a perioperative stroke.  I will call them back with an update after pt is out of IR.  Ruta Hinds, MD [facility] [place] Office: [phone]

## 2019-12-07 NOTE — Progress Notes (Signed)
EEG complete - results pending 

## 2019-12-07 NOTE — Transfer of Care (Signed)
Immediate Anesthesia Transfer of Care Note  Patient: William Webb [facility]  Procedure(s) Performed: IR WITH ANESTHESIA (N/A )  Patient Location: ICU  Anesthesia Type:General  Level of Consciousness: sedated and Patient remains intubated per anesthesia plan  Airway & Oxygen Therapy: Patient remains intubated per anesthesia plan and Patient placed on Ventilator (see vital sign flow sheet for setting)  Post-op Assessment: Report given to RN and Post -op Vital signs reviewed and stable  Post vital signs: Reviewed and stable  Last Vitals:  Vitals Value Taken Time  BP 135/74 12/07/19 1535  Temp    Pulse 65 12/07/19 1545  Resp 26 12/07/19 1545  SpO2 89 % 12/07/19 1545  Vitals shown include unvalidated device data.  Last Pain:  Vitals:   12/07/19 1108  PainSc: Asleep      Patients Stated Pain Goal: 4 (32/76/14 7092)  Complications: No apparent anesthesia complications

## 2019-12-07 NOTE — Progress Notes (Addendum)
Patient was very difficult to arouse for about 1 hour and 30 minutes in PACU. Dr. Ermalene Postin aware. Patient opened his eyes around 12:23 and repeatedly turned his head back and forth. Patient is not following commands, not speaking, and staring blankly. Patient is voluntarily moving all extremities. No facial droop noted. Blood pressure increased and oxygen demand increased from simple mask of 6 L to simple mask 10 L after becoming alert in PACU Dr. Ermalene Postin aware and at bedside. Neurology paged and at bedside. Dr. Oneida Alar and [place], [place] aware of patient's status and that neurology is at bedside for consult.

## 2019-12-07 NOTE — Anesthesia Procedure Notes (Signed)
Procedure Name: Intubation Date/Time: 12/07/2019 8:30 AM Performed by: Oleta Mouse, MD Pre-anesthesia Checklist: Patient identified, Emergency Drugs available, Suction available and Patient being monitored Patient Re-evaluated:Patient Re-evaluated prior to induction Oxygen Delivery Method: Circle System Utilized Preoxygenation: Pre-oxygenation with 100% oxygen Induction Type: IV induction Ventilation: Mask ventilation without difficulty and Oral airway inserted - appropriate to patient size Laryngoscope Size: Mac and 4 Grade View: Grade III Tube type: Oral Number of attempts: 1 Airway Equipment and Method: Stylet and Oral airway Placement Confirmation: ETT inserted through vocal cords under direct vision,  positive ETCO2 and breath sounds checked- equal and bilateral Secured at: 22 cm Tube secured with: Tape Dental Injury: Teeth and Oropharynx as per pre-operative assessment

## 2019-12-07 NOTE — Progress Notes (Signed)
Patient transported to CT with Neurology PA Shanon Brow, [place], Neurology RN, and PACU RN Suszanne Conners, RN.
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
## 2019-12-07 NOTE — Anesthesia Procedure Notes (Signed)
Procedure Name: Intubation Date/Time: 12/07/2019 1:45 PM Performed by: Trinna Post., CRNA Pre-anesthesia Checklist: Patient identified, Emergency Drugs available, Suction available and Patient being monitored Patient Re-evaluated:Patient Re-evaluated prior to induction Oxygen Delivery Method: Ambu bag Preoxygenation: Pre-oxygenation with 100% oxygen Induction Type: IV induction Ventilation: Mask ventilation without difficulty Laryngoscope Size: Glidescope and 4 Grade View: Grade I Tube type: Subglottic suction tube Tube size: 7.5 mm Number of attempts: 1 Airway Equipment and Method: Stylet and Video-laryngoscopy Placement Confirmation: ETT inserted through vocal cords under direct vision,  positive ETCO2 and breath sounds checked- equal and bilateral Secured at: 22 cm Tube secured with: Tape Dental Injury: Teeth and Oropharynx as per pre-operative assessment

## 2019-12-07 NOTE — Code Documentation (Signed)
69 yo male coming from PACU where he was Post-op after AV Fistula placed in the left upper arm. Pt came in for an outpatient procedure and was LKW at 0730. Pt was started on Moderate Sedation at 0730. At 0830, pt was noted to be intubated by Anesthesia. Surgery was complete and patient sent to PACU around 1000. RN noted that patient was not recovering well. He continued to have altered mental status with weakness in bilaterally R >L. Anesthesia called and activated a Code Stroke. Stroke Team met patient at the bedside and noted that patient would not follow commands, head moved side to side and patient's eyes rocking and back and forth. No facial droop noted. Bilateral weakness with R>L on arms and legs. Pt taken down to CT. CT Head hard to complete due to patient not sitting still. 2 mg of Ativan given at 1313. Pt unable to sit still initially and then CT able to be completed. CT completed - no hemorrhage noted. Pt is not a tPA candidate due to being outside the window. CTA ordered, but patient did not have access. CRNA called and came down to attempt access. Unsuccessful. MD Ermalene Postin called for reintubation due to patient being unable to hold his airway. INtubated in CT at 1345. Pt transferred to MRI at 1405 for MRI/MRA to be completed. Waiting for results for patient treatment plan.
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
## 2019-12-07 NOTE — Procedures (Addendum)
Patient Name: William Webb [facility]  MRN: [id number]  Epilepsy Attending: Lora Havens  Referring Physician/Provider: Dr Karena Addison Aroor Date: 12/07/2019 Duration: 22.26 mins  Patient history: 69 year old male who presented for Revision ofleft basilic vein transposition fistula.  Status post surgery he was noted to not wake up as expected, appeared agitated, and had constant head movement in which se would turn his head left to right. EEG to evaluate for seizure  Level of alertness: comatose/sedated  AEDs during EEG study: Propofol  Technical aspects: This EEG study was done with scalp electrodes positioned according to the 10-20 International system of electrode placement. Electrical activity was acquired at a sampling rate of 500Hz  and reviewed with a high frequency filter of 70Hz  and a low frequency filter of 1Hz . EEG data were recorded continuously and digitally stored.   DESCRIPTION: EEG showed continuous generalized 2-3hz  low amplitude slowing. EEG was reactive to tactile stimulation. Hyperventilation and photic stimulation were not performed.  Of note, study was technically difficult due to significant myogenic artifact because of patient clenching ET tube.   ABNORMALITY - Continuous slow, generalized   IMPRESSION: This technically difficult study is suggestive of severe diffuse encephalopathy, non specific to etiology but could be secondary to sedation. No seizures or epileptiform discharges were seen throughout the recording.  Jisel Fleet Barbra Sarks

## 2019-12-07 NOTE — Progress Notes (Signed)
Noted that pt has a sensor on his Rt upper back of arm - placed consult to Diabetes Coordinator. Call back immediately and advised to remove sensor since pt is intubated and on sedation. No insulin pump noted. Sensor removed without incident - placed 2x2 over site where it was removed. No bleeding or drainage noted.

## 2019-12-07 NOTE — Progress Notes (Signed)
Updated Dr. Oneida Alar on patients location and status. Patient to go to 4N ICU RM28. Currently in MRI.  Rowe Pavy, RN

## 2019-12-07 NOTE — Anesthesia Procedure Notes (Signed)
Procedure Name: LMA Insertion Date/Time: 12/07/2019 7:46 AM Performed by: Alain Marion, CRNA Pre-anesthesia Checklist: Patient identified, Emergency Drugs available, Suction available and Patient being monitored Patient Re-evaluated:Patient Re-evaluated prior to induction Oxygen Delivery Method: Circle System Utilized Preoxygenation: Pre-oxygenation with 100% oxygen Induction Type: IV induction Ventilation: Mask ventilation without difficulty LMA: LMA inserted LMA Size: 5.0 Number of attempts: 1 Airway Equipment and Method: Bite block Placement Confirmation: positive ETCO2 Tube secured with: Tape Dental Injury: Teeth and Oropharynx as per pre-operative assessment

## 2019-12-07 NOTE — Op Note (Signed)
Procedure: Revision of left basilic vein transposition fistula ( 2nd stage)  Preoperative diagnosis: End-stage renal disease  Postoperative diagnosis: Same  Anesthesia: Gen.  Asst.: Corrina Baglia PA-C.  Operative findings: 4-6 mm basilic vein  Operative details: After obtaining informed consent, the patient was taken to the operating room. The patient was placed in supine position on the operating room table. After induction of general anesthesia the patient's entire left upper extremity was prepped and draped in usual sterile fashion. A longitudinal incision was made through a pre-existing scar near the right antecubital area. The incision was carried down through the subcutaneous tissues down to the level of the previous arterial anastomosis for the fistula. The fistula at this level was approximately 3 mm in diameter. There is an easily palpable thrill within it.  At this point in the case the patient experienced some oxygen desaturation and hypotension as well as bradycardia.  The anesthesia team converted him from a laryngeal mask to an endotracheal tube.  This improved over the next few minutes.  I then proceeded to completely mobilize the basilic vein all the way up to the level of the axilla through several skip incisions. The vein was approximate 6 mm in diameter at the level of the axilla. The initial intent was to only superficial was the vein. However, there were several sensory nerves tethering the vein in order to bring this vein closer to the surface I found that it was going to be impossible to do this without transecting the fistula and then re-anastomosing it. The patient was given 5000 units of intravenous heparin. Vessel loops were used to control the artery proximally and distally after appropriate circulation time of the heparin. The fistula was then disconnected right at the level of the anastomosis. There was some intimal hyperplasia within this. This was debrided away with  scissors. The vein was gently distended with heparinized saline and marked for orientation. It was then brought to a more superficial position with a tunneler over the left biceps muscle. The vein was also removed from its tethered segment below the sensory nerves. An end-to-side anastomosis was then created using a running 6-0 Prolene suture. Just prior completion of the anastomosis the artery was fore bled and backbled and thoroughly flushed.   The anastomosis was secured, vessel loops released, and there was good flow into the fistula immediately. Hemostasis was obtained.  Subcutaneous layers were closed with running 3-0 Vicryl suture.  All other incisions were closed with a 4-0 Vicryl subcuticular stitch in the skin. Dermabond was applied all incisions. Patient tolerated the procedure well and there were no complications.  Instrument sponge and needle counts were correct at the end of the case. Patient had a palpable radial pulse at the end of the case.   Ruta Hinds, MD [facility] [place] Office: [phone] Pager: [phone]
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
## 2019-12-08 LAB — CBC
HCT: 25.8 % — ABNORMAL LOW (ref 39.0–52.0)
Hemoglobin: 7.8 g/dL — ABNORMAL LOW (ref 13.0–17.0)
MCH: 27.1 pg (ref 26.0–34.0)
MCHC: 30.2 g/dL (ref 30.0–36.0)
MCV: 89.6 fL (ref 80.0–100.0)
Platelets: 169 10*3/uL (ref 150–400)
RBC: 2.88 MIL/uL — ABNORMAL LOW (ref 4.22–5.81)
RDW: 16.2 % — ABNORMAL HIGH (ref 11.5–15.5)
WBC: 5.2 10*3/uL (ref 4.0–10.5)
nRBC: 0 % (ref 0.0–0.2)

## 2019-12-08 LAB — GLUCOSE, CAPILLARY
Glucose-Capillary: 101 mg/dL — ABNORMAL HIGH (ref 70–99)
Glucose-Capillary: 109 mg/dL — ABNORMAL HIGH (ref 70–99)
Glucose-Capillary: 94 mg/dL (ref 70–99)
Glucose-Capillary: 104 mg/dL — ABNORMAL HIGH (ref 70–99)
Glucose-Capillary: 134 mg/dL — ABNORMAL HIGH (ref 70–99)
Glucose-Capillary: 153 mg/dL — ABNORMAL HIGH (ref 70–99)
Glucose-Capillary: 151 mg/dL — ABNORMAL HIGH (ref 70–99)

## 2019-12-08 LAB — RENAL FUNCTION PANEL
Albumin: 2.1 g/dL — ABNORMAL LOW (ref 3.5–5.0)
Anion gap: 11 (ref 5–15)
BUN: 27 mg/dL — ABNORMAL HIGH (ref 8–23)
CO2: 22 mmol/L (ref 22–32)
Calcium: 7.9 mg/dL — ABNORMAL LOW (ref 8.9–10.3)
Chloride: 108 mmol/L (ref 98–111)
Creatinine, Ser: 2.81 mg/dL — ABNORMAL HIGH (ref 0.61–1.24)
GFR calc Af Amer: 25 mL/min — ABNORMAL LOW (ref >60)
GFR calc non Af Amer: 22 mL/min — ABNORMAL LOW (ref >60)
Glucose, Bld: 107 mg/dL — ABNORMAL HIGH (ref 70–99)
Phosphorus: 3.4 mg/dL (ref 2.5–4.6)
Potassium: 3.8 mmol/L (ref 3.5–5.1)
Sodium: 141 mmol/L (ref 135–145)

## 2019-12-08 LAB — TRIGLYCERIDES: Triglycerides: 251 mg/dL — ABNORMAL HIGH (ref <150)

## 2019-12-08 LAB — HEMOGLOBIN A1C
Hgb A1c MFr Bld: 7.7 % — ABNORMAL HIGH (ref 4.8–5.6)
Mean Plasma Glucose: 174 mg/dL

## 2019-12-08 MED ORDER — APIXABAN 5 MG PO TABS: 5.0000 mg | ORAL_TABLET | Freq: Two times a day (BID) | ORAL | Status: DC

## 2019-12-08 MED ORDER — INSULIN ASPART 100 UNIT/ML ~~LOC~~ SOLN
0.0000 [IU] | Freq: Three times a day (TID) | SUBCUTANEOUS | Status: DC
  Administered 2019-12-09: 2 [IU] via SUBCUTANEOUS
  Administered 2019-12-10 (×2): 1 [IU] via SUBCUTANEOUS
  Administered 2019-12-11: 2 [IU] via SUBCUTANEOUS

## 2019-12-08 MED ORDER — CARVEDILOL 25 MG PO TABS
25.0000 mg | ORAL_TABLET | Freq: Two times a day (BID) | ORAL | Status: DC
  Administered 2019-12-08 – 2019-12-11 (×7): 25 mg via ORAL
  Filled 2019-12-08 (×7): qty 1

## 2019-12-08 MED ORDER — AMLODIPINE BESYLATE 10 MG PO TABS
10.0000 mg | ORAL_TABLET | Freq: Every day | ORAL | Status: DC
  Administered 2019-12-08 – 2019-12-11 (×4): 10 mg via ORAL
  Filled 2019-12-08 (×4): qty 1

## 2019-12-08 MED ORDER — SODIUM CHLORIDE 0.9 % IV SOLN
510.0000 mg | INTRAVENOUS | Status: AC
  Administered 2019-12-08 – 2019-12-11 (×2): 510 mg via INTRAVENOUS
  Filled 2019-12-08 (×2): qty 17

## 2019-12-08 MED ORDER — APIXABAN 5 MG PO TABS
5.0000 mg | ORAL_TABLET | Freq: Two times a day (BID) | ORAL | Status: DC
  Administered 2019-12-08 – 2019-12-11 (×7): 5 mg via ORAL
  Filled 2019-12-08 (×7): qty 1

## 2019-12-08 MED ORDER — INSULIN ASPART 100 UNIT/ML ~~LOC~~ SOLN: 0.0000 [IU] | Freq: Every day | SUBCUTANEOUS | Status: DC

## 2019-12-08 MED ORDER — ROSUVASTATIN CALCIUM 20 MG PO TABS
20.0000 mg | ORAL_TABLET | Freq: Every day | ORAL | Status: DC
  Administered 2019-12-08 – 2019-12-10 (×3): 20 mg via ORAL
  Filled 2019-12-08 (×3): qty 1

## 2019-12-08 NOTE — Procedures (Signed)
Extubation Procedure Note  Patient Details:   Name: William Webb DOB: [birth year] MRN: [id number]   Airway Documentation:    Vent end date: 12/08/19 Vent end time: 0954   Evaluation  O2 sats: stable throughout Complications: No apparent complications Patient did tolerate procedure well. Bilateral Breath Sounds: Diminished, Rhonchi   Yes   Pt extubated to 2L N/C.  No stridor noted.  RN @ bedside.  Donnetta Hail 12/08/2019, 9:54 AM

## 2019-12-08 NOTE — Progress Notes (Signed)
NAME:  William Webb, MRN:  [id number], DOB:  Mar 27, 1951, LOS: 1 ADMISSION DATE:  12/07/2019, CONSULTATION DATE:  12/07/2019 REFERRING MD:  Dr. Oneida Alar, VVS, CHIEF COMPLAINT:  Altered mental status  Brief History   69 yo male former smoker with hx of ESRD presented for revision of Lt basilic vein transposition fistula.  He was noted to have hypoxic and hypotensive during the procedure for about 7 to 10 minutes.  He was able to eventually complete the procedure.  He was extubated in PACU, but then had altered mental status with Rt > Lt sided weakness.  There was concern for stroke and neurology consulted.  There was concern for possible seizure also.  He required reintubation.  Past Medical History  ESRD, HTN, HLD, CAD s/p stent, DM type II, Persistent a fib, Glaucoma, GERD, Fibromyalgia, DM retinopathy, DM neuropathy  Significant Hospital Events   2/08 admit 2/09 extubated  Consults:  Neurology Nephrology  Procedures:  ETT 2/08 >> 2/09  Significant Diagnostic Tests:  CT head 2/08 >> no acute findings MRI brain 2/08 >> mild chronic small vessel ischemic disease EEG 2/08 >> generalized slowing  Micro Data:  SARS CoV2 TAT 2/06 >> negative  Antimicrobials:    Interim history/subjective:  Pressure support.  Objective   Blood pressure (!) 165/77, pulse 78, temperature 97.8 F (36.6 C), temperature source Axillary, resp. rate 20, height 6\' 3"  (1.905 m), weight 100.6 kg, SpO2 100 %.    Vent Mode: PRVC FiO2 (%):  [40 %] 40 % Set Rate:  [18 bmp] 18 bmp Vt Set:  [670 mL] 670 mL PEEP:  [5 cmH20] 5 cmH20 Plateau Pressure:  [17 cmH20-30 cmH20] 17 cmH20   Intake/Output Summary (Last 24 hours) at 12/08/2019 0911 Last data filed at 12/08/2019 0900 Gross per 24 hour  Intake 1154.04 ml  Output 500 ml  Net 654.04 ml   Filed Weights   12/07/19 0613 12/08/19 0500  Weight: 102.1 kg 100.6 kg    Examination:  General - more alert with WUA Eyes - pupils reactive ENT - ETT in  place Cardiac - regular rate/rhythm, no murmur Chest - equal breath sounds b/l, no wheezing or rales Abdomen - soft, non tender, + bowel sounds Extremities - Lt arm fistula Skin - venous stasis changes Neuro - follows simple commands with [facility] Problem list     Assessment & Plan:   Acute hypoxic respiratory failure with compromised airway. - extubated 2/09 - goal SpO2 > 92%  Altered mental status. - likely from residual effects of anesthesia - monitor mental status after extubation  ESRD s/p fistula. - per renal and VVS  DM type II poorly controlled with neuropathy and retinopathy. - SSI - hold outpt neurotin  Hx of CAD, HTN, HLD, Permanent A fib. - resumoe outpt norvasc, coreg, eliquis, crestor - hold cozaar, lasix for now  Anemia of chronic disease and iron deficiency. - f/u CBC - continue feraheme - transfuse for Hb < 7 or significant bleeding   Best practice:  Diet: advanced diet after extubation DVT prophylaxis: eliquis GI prophylaxis: Protonix Mobility: bed rest Code Status: full code Disposition: ICU  Labs    CMP Latest Ref Rng & Units 12/08/2019 12/07/2019 12/07/2019  Glucose 70 - 99 mg/dL 107(H) - 161(H)  BUN 8 - 23 mg/dL 27(H) - 28(H)  Creatinine 0.61 - 1.24 mg/dL 2.81(H) - 3.00(H)  Sodium 135 - 145 mmol/L 141 141 136  Potassium 3.5 - 5.1 mmol/L 3.8 4.4 4.2  Chloride 98 - 111 mmol/L 108 - 105  CO2 22 - 32 mmol/L 22 - 21(L)  Calcium 8.9 - 10.3 mg/dL 7.9(L) - 7.6(L)  Total Protein 6.5 - 8.1 g/dL - - -  Total Bilirubin 0.3 - 1.2 mg/dL - - -  Alkaline Phos 38 - 126 U/L - - -  AST 15 - 41 U/L - - -  ALT 0 - 44 U/L - - -    CBC Latest Ref Rng & Units 12/08/2019 12/07/2019 12/07/2019  WBC 4.0 - 10.5 K/uL 5.2 - 5.0  Hemoglobin 13.0 - 17.0 g/dL 7.8(L) 7.8(L) 7.4(L)  Hematocrit 39.0 - 52.0 % 25.8(L) 23.0(L) 24.5(L)  Platelets 150 - 400 K/uL 169 - 165    ABG    Component Value Date/Time   PHART 7.387 12/07/2019 1734   PCO2ART 41.7  12/07/2019 1734   PO2ART 65.0 (L) 12/07/2019 1734   HCO3 25.1 12/07/2019 1734   TCO2 26 12/07/2019 1734   O2SAT 92.0 12/07/2019 1734    CBG (last 3)  Recent Labs    12/08/19 0114 12/08/19 0322 12/08/19 0740  GLUCAP 101* 109* 94    CC time 31 minutes  Chesley Mires, MD [place] 12/08/2019, 9:17 AM

## 2019-12-08 NOTE — Care Management (Deleted)
Pt deemed stable for discharge today.  CM reviewed chart for TOC orders/needs/consults - none determined.  Discharge order signed - CM signing off

## 2019-12-08 NOTE — Progress Notes (Addendum)
  Postoperative hemodialysis access     Date of Surgery:  12/07/2019 Surgeon: Oneida Alar  Subjective:  Intubated; when propofol weaned, pt is agitated.  He does awakes and will follow some commands   PHYSICAL EXAMINATION:  Vitals:   12/08/19 0700 12/08/19 0818  BP:  (!) 173/70  Pulse:  78  Resp:  18  Temp: 97.8 F (36.6 C)   SpO2:  100%    Incision is clean and dry Unable to determine sensation left hand.  Motor is in tact as he squeezes left hand.  There is  Thrill  The left radial pulse is palpable.  The fistula is palpable  Neuro:  Squeezes left hand but not the right.  He wiggles toes on both feet to command.     ASSESSMENT/PLAN:  William Webb is a 69 y.o. year old male who is s/p left 1st stage BVT yesterday with acute encephalopathy  -pt remains intubated-per RN, pt is agitated with weaning diprivan.  Plan is for extubation today per CCM -pt is following some commands.  He will squeeze left hand but not right.  He does wiggle toes on the right foot.   Will be able to better determine once he is less agitated.  -graft/fistula is patent -pt has palpable left radial pulse but unable to determine steal since he is intubated.  -appreciate neuro and CCM assistance with pt    Leontine Locket, PA-C [facility] [phone]   Pt awake following commands moving all extremities extubated + thrill in fistula Hopefully out of ICU tomorrow if continues to improve OOB PT OT Appreciate Neuro CCM input Ok to restart eliquis from my standpoint  Updated wife Reino Bellis by phone [phone] am  Ruta Hinds, MD [facility] [place] Office: [phone]

## 2019-12-08 NOTE — Progress Notes (Signed)
Patient ID: William Webb [facility], male   DOB: 26-Mar-1951, 69 y.o.   MRN: [id number] [facility] Progress Note   Assessment/ Plan:   1.  Altered level of consciousness/acute encephalopathy:  Without clear evidence of CVA or seizure, awaiting additional input from neurology.  Troponin level mildly elevated but appears to be trending down. 2.  Chronic kidney disease stage IV: High risk for ATN/AKI given transient hypotension and hypoxemia.  Slight rise of creatinine to as high as 3.0 noted yesterday evening however labs this morning show creatinine back down to 2.8 with 500 cc urine output recorded.  We will continue to follow closely with periodic labs continue to support blood pressure. 3.  Acute hypoxic respiratory failure: No evidence of florid pulmonary edema/pleural effusion.  On ventilator support at this time and will follow CCM recommendations. 4.  Anemia of chronic kidney disease: Possibly compounded by some surgical losses, iron stores low without acute indications for PRBC transfusion.  Will give IV iron.  Subjective:   Without acute events noted overnight, EEG consistent with severe diffuse encephalopathy, MRI/MRA negative for acute intracranial pathology.   Objective:   BP (!) 181/80   Pulse 71   Temp 98.5 F (36.9 C) (Oral)   Resp 18   Ht 6' 3" (1.905 m)   Wt 100.6 kg   SpO2 100%   BMI 27.72 kg/m   Intake/Output Summary (Last 24 hours) at 12/08/2019 0648 Last data filed at 12/08/2019 0600 Gross per 24 hour  Intake 1241.9 ml  Output 500 ml  Net 741.9 ml   Weight change: -1.459 kg  Physical Exam: Gen: Intubated, sedated on propofol CVS: Pulse regular rhythm, normal rate, S1 and S2 normal Resp: Anteriorly clear to auscultation, no rales/rhonchi Abd: Soft, flat, nontender Ext: 1-2+ lower extremity edema with trace-1+ upper extremity edema.  Palpable thrill over left upper arm brachiobasilic fistula with some postoperative edema  Imaging: MR ANGIO HEAD WO  CONTRAST  Result Date: 12/07/2019 CLINICAL DATA:  Stroke follow-up. Altered mental status following surgery. EXAM: MRI HEAD WITHOUT CONTRAST MRA HEAD WITHOUT CONTRAST MRA NECK WITHOUT CONTRAST TECHNIQUE: Multiplanar, multiecho pulse sequences of the brain and surrounding structures were obtained without intravenous contrast. Angiographic images of the Circle of Willis were obtained using MRA technique without intravenous contrast. Angiographic images of the neck were obtained using MRA technique without intravenous contrast. Carotid stenosis measurements (when applicable) are obtained utilizing NASCET criteria, using the distal internal carotid diameter as the denominator. COMPARISON:  Head CT 12/27/2019 FINDINGS: MRI HEAD FINDINGS Brain: There is no evidence of acute infarct, mass, midline shift, or extra-axial fluid collection. Small foci of T2 hyperintensity in the cerebral white matter bilaterally are nonspecific but compatible with mild chronic small vessel ischemic disease. 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 A single chronic microhemorrhage is noted in the left cerebellum. There is mild cerebral atrophy. Vascular: Major intracranial vascular flow voids are preserved. Skull and upper cervical spine: Unremarkable bone marrow signal. Sinuses/Orbits: Right cataract extraction. Prominent right sphenoid sinus mucosal thickening. Moderate bilateral ethmoid air cell mucosal thickening. Clear mastoid air cells. Other: None. MRA HEAD FINDINGS The intracranial vertebral arteries are widely patent to the basilar. Patent P ICAs are seen bilaterally although the origin was not imaged on the left. Patent AICAs and SCA is are also seen bilaterally. The basilar artery is patent with mild stenosis versus artifact in its midportion. There is a small right posterior communicating artery. Both PCAs are patent without evidence  of significant proximal stenosis. The internal carotid arteries are  patent from skull base to carotid termini with mild supraclinoid stenosis on the left. ACAs and MCAs are patent without evidence of proximal branch occlusion or significant proximal stenosis. No aneurysm is identified. MRA NECK FINDINGS Assessment is limited by mild motion artifact and noncontrast technique. The aortic arch and proximal common carotid arteries were not included. The included portions of the common carotid and cervical internal carotid arteries are patent without evidence of significant stenosis. The vertebral arteries are patent and codominant with antegrade flow bilaterally. The right vertebral artery origin is suboptimally evaluated due to motion artifact, however the remainder of the vertebral arteries are widely patent bilaterally without evidence of significant stenosis or dissection. IMPRESSION: 1. No acute intracranial abnormality. 2. Mild chronic small vessel ischemic disease. 3. Evidence of mild intracranial atherosclerosis without large vessel occlusion or flow limiting proximal stenosis. 4. Widely patent cervical carotid and vertebral arteries. Electronically Signed   By: Logan Bores M.D.   On: 12/07/2019 17:46   MR ANGIO NECK WO CONTRAST  Result Date: 12/07/2019 CLINICAL DATA:  Stroke follow-up. Altered mental status following surgery. EXAM: MRI HEAD WITHOUT CONTRAST MRA HEAD WITHOUT CONTRAST MRA NECK WITHOUT CONTRAST TECHNIQUE: Multiplanar, multiecho pulse sequences of the brain and surrounding structures were obtained without intravenous contrast. Angiographic images of the Circle of Willis were obtained using MRA technique without intravenous contrast. Angiographic images of the neck were obtained using MRA technique without intravenous contrast. Carotid stenosis measurements (when applicable) are obtained utilizing NASCET criteria, using the distal internal carotid diameter as the denominator. COMPARISON:  Head CT 12/27/2019 FINDINGS: MRI HEAD FINDINGS Brain: There is no evidence  of acute infarct, mass, midline shift, or extra-axial fluid collection. Small foci of T2 hyperintensity in the cerebral white matter bilaterally are nonspecific but compatible with mild chronic small vessel ischemic disease. 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 A single chronic microhemorrhage is noted in the left cerebellum. There is mild cerebral atrophy. Vascular: Major intracranial vascular flow voids are preserved. Skull and upper cervical spine: Unremarkable bone marrow signal. Sinuses/Orbits: Right cataract extraction. Prominent right sphenoid sinus mucosal thickening. Moderate bilateral ethmoid air cell mucosal thickening. Clear mastoid air cells. Other: None. MRA HEAD FINDINGS The intracranial vertebral arteries are widely patent to the basilar. Patent P ICAs are seen bilaterally although the origin was not imaged on the left. Patent AICAs and SCA is are also seen bilaterally. The basilar artery is patent with mild stenosis versus artifact in its midportion. There is a small right posterior communicating artery. Both PCAs are patent without evidence of significant proximal stenosis. The internal carotid arteries are patent from skull base to carotid termini with mild supraclinoid stenosis on the left. ACAs and MCAs are patent without evidence of proximal branch occlusion or significant proximal stenosis. No aneurysm is identified. MRA NECK FINDINGS Assessment is limited by mild motion artifact and noncontrast technique. The aortic arch and proximal common carotid arteries were not included. The included portions of the common carotid and cervical internal carotid arteries are patent without evidence of significant stenosis. The vertebral arteries are patent and codominant with antegrade flow bilaterally. The right vertebral artery origin is suboptimally evaluated due to motion artifact, however the remainder of the vertebral arteries are widely patent bilaterally without  evidence of significant stenosis or dissection. IMPRESSION: 1. No acute intracranial abnormality. 2. Mild chronic small vessel ischemic disease. 3. Evidence of mild intracranial atherosclerosis without large vessel  occlusion or flow limiting proximal stenosis. 4. Widely patent cervical carotid and vertebral arteries. Electronically Signed   By: Logan Bores M.D.   On: 12/07/2019 17:46   MR BRAIN WO CONTRAST  Result Date: 12/07/2019 CLINICAL DATA:  Stroke follow-up. Altered mental status following surgery. EXAM: MRI HEAD WITHOUT CONTRAST MRA HEAD WITHOUT CONTRAST MRA NECK WITHOUT CONTRAST TECHNIQUE: Multiplanar, multiecho pulse sequences of the brain and surrounding structures were obtained without intravenous contrast. Angiographic images of the Circle of Willis were obtained using MRA technique without intravenous contrast. Angiographic images of the neck were obtained using MRA technique without intravenous contrast. Carotid stenosis measurements (when applicable) are obtained utilizing NASCET criteria, using the distal internal carotid diameter as the denominator. COMPARISON:  Head CT 12/27/2019 FINDINGS: MRI HEAD FINDINGS Brain: There is no evidence of acute infarct, mass, midline shift, or extra-axial fluid collection. Small foci of T2 hyperintensity in the cerebral white matter bilaterally are nonspecific but compatible with mild chronic small vessel ischemic disease. 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 A single chronic microhemorrhage is noted in the left cerebellum. There is mild cerebral atrophy. Vascular: Major intracranial vascular flow voids are preserved. Skull and upper cervical spine: Unremarkable bone marrow signal. Sinuses/Orbits: Right cataract extraction. Prominent right sphenoid sinus mucosal thickening. Moderate bilateral ethmoid air cell mucosal thickening. Clear mastoid air cells. Other: None. MRA HEAD FINDINGS The intracranial vertebral arteries are  widely patent to the basilar. Patent P ICAs are seen bilaterally although the origin was not imaged on the left. Patent AICAs and SCA is are also seen bilaterally. The basilar artery is patent with mild stenosis versus artifact in its midportion. There is a small right posterior communicating artery. Both PCAs are patent without evidence of significant proximal stenosis. The internal carotid arteries are patent from skull base to carotid termini with mild supraclinoid stenosis on the left. ACAs and MCAs are patent without evidence of proximal branch occlusion or significant proximal stenosis. No aneurysm is identified. MRA NECK FINDINGS Assessment is limited by mild motion artifact and noncontrast technique. The aortic arch and proximal common carotid arteries were not included. The included portions of the common carotid and cervical internal carotid arteries are patent without evidence of significant stenosis. The vertebral arteries are patent and codominant with antegrade flow bilaterally. The right vertebral artery origin is suboptimally evaluated due to motion artifact, however the remainder of the vertebral arteries are widely patent bilaterally without evidence of significant stenosis or dissection. IMPRESSION: 1. No acute intracranial abnormality. 2. Mild chronic small vessel ischemic disease. 3. Evidence of mild intracranial atherosclerosis without large vessel occlusion or flow limiting proximal stenosis. 4. Widely patent cervical carotid and vertebral arteries. Electronically Signed   By: Logan Bores M.D.   On: 12/07/2019 17:46   DG Chest Port 1 View  Result Date: 12/07/2019 CLINICAL DATA:  Endotracheal tube placement EXAM: PORTABLE CHEST 1 VIEW COMPARISON:  Radiograph 12/10/2018 FINDINGS: Endotracheal tube 3.2 cm from carina. Bilateral pleural effusions. No overt pulmonary edema. No pneumothorax. IMPRESSION: Endotracheal tube in good position. Bilateral pleural effusions. Electronically Signed   By:  Suzy Bouchard M.D.   On: 12/07/2019 16:48   CT HEAD CODE STROKE WO CONTRAST`  Result Date: 12/07/2019 CLINICAL DATA:  Code stroke.  Right arm weakness. EXAM: CT HEAD WITHOUT CONTRAST TECHNIQUE: Contiguous axial images were obtained from the base of the skull through the vertex without intravenous contrast. COMPARISON:  10/29/2018 FINDINGS: Brain: There is no evidence of acute infarct, intracranial  hemorrhage, mass, midline shift, or extra-axial fluid collection. Mild cerebral atrophy is unchanged. Scattered hypodensities in the cerebral white matter similar to the prior study and nonspecific but compatible with mild chronic small vessel ischemic disease. Vascular: Calcified atherosclerosis at the skull base. No hyperdense vessel. Skull: No fracture suspicious osseous lesion. Sinuses/Orbits: Extensive right sphenoid sinus mucosal thickening which is partly polypoid. Mild bilateral ethmoid sinus mucosal thickening. Clear mastoid air cells. Right cataract extraction. Other: None. ASPECTS [facility] Stroke Program Early CT Score) - Ganglionic level infarction (caudate, lentiform nuclei, internal capsule, insula, M1-M3 cortex): 7 - Supraganglionic infarction (M4-M6 cortex): 3 Total score (0-10 with 10 being normal): 10 IMPRESSION: 1. No evidence of acute intracranial abnormality. 2. ASPECTS is 10. These results were communicated to Dr. Lorraine Lax at 1:37 pm on 12/07/2019 by text page via the [facility] messaging system. Electronically Signed   By: Logan Bores M.D.   On: 12/07/2019 13:37    Labs: BMET Recent Labs  Lab 12/07/19 0706 12/07/19 1253 12/07/19 1619 12/07/19 1700 12/07/19 1734 12/08/19 0214  NA 139 142  --  136 141 141  K 4.0 4.4  --  4.2 4.4 3.8  CL 110  --   --  105  --  108  CO2  --   --   --  21*  --  22  GLUCOSE 153*  --   --  161*  --  107*  BUN 27*  --   --  28*  --  27*  CREATININE 2.80*  --  2.92* 3.00*  --  2.81*  CALCIUM  --   --   --  7.6*  --  7.9*  PHOS  --   --   --   --   --  3.4    CBC Recent Labs  Lab 12/07/19 1253 12/07/19 1619 12/07/19 1734 12/08/19 0214  WBC  --  5.0  --  5.2  HGB 8.2* 7.4* 7.8* 7.8*  HCT 24.0* 24.5* 23.0* 25.8*  MCV  --  89.7  --  89.6  PLT  --  165  --  169    Medications:    . chlorhexidine gluconate (MEDLINE KIT)  15 mL Mouth Rinse BID  . Chlorhexidine Gluconate Cloth  6 each Topical Daily  . heparin  5,000 Units Subcutaneous Q8H  . insulin aspart  0-9 Units Subcutaneous Q4H  . mouth rinse  15 mL Mouth Rinse 10 times per day  . pantoprazole (PROTONIX) IV  40 mg Intravenous QHS  . sodium chloride flush  3 mL Intravenous Q12H   Elmarie Shiley, MD 12/08/2019, 6:48 AM

## 2019-12-08 NOTE — Progress Notes (Signed)
Reason for consult:   Subjective: Patient intubated, moving all 4 extremities.  Not following any commands.  Apparently overnight patient did follow simple commands according to night shift nurse.   ROS:  Unable to obtain due to poor mental status  Examination  Vital signs in last 24 hours: Temp:  [97.8 F (36.6 C)-98.5 F (36.9 C)] 97.8 F (36.6 C) (02/09 0700) Pulse Rate:  [63-85] 78 (02/09 0818) Resp:  [12-22] 18 (02/09 0818) BP: (150-222)/(70-93) 173/70 (02/09 0818) SpO2:  [91 %-100 %] 100 % (02/09 0818) FiO2 (%):  [40 %] 40 % (02/09 0818) Weight:  [100.6 kg] 100.6 kg (02/09 0500)  General: lying in bed, intubated CVS: pulse-normal rate and rhythm RS: breathing comfortably Extremities: normal   Neuro: MS: Not following any commands, not tracking examiner CN: pupils equal and reactive,  EOMI, gag reflex intact Motor: Moving all 4 extremities with good strength, antigravity Reflexes: , plantars: flexor Coordination: normal Gait: not tested  Basic Metabolic Panel: Recent Labs  Lab 12/07/19 0706 12/07/19 1253 12/07/19 1619 12/07/19 1700 12/07/19 1734 12/08/19 0214  NA 139 142  --  136 141 141  K 4.0 4.4  --  4.2 4.4 3.8  CL 110  --   --  105  --  108  CO2  --   --   --  21*  --  22  GLUCOSE 153*  --   --  161*  --  107*  BUN 27*  --   --  28*  --  27*  CREATININE 2.80*  --  2.92* 3.00*  --  2.81*  CALCIUM  --   --   --  7.6*  --  7.9*  PHOS  --   --   --   --   --  3.4    CBC: Recent Labs  Lab 12/07/19 0706 12/07/19 1253 12/07/19 1619 12/07/19 1734 12/08/19 0214  WBC  --   --  5.0  --  5.2  HGB 7.8* 8.2* 7.4* 7.8* 7.8*  HCT 23.0* 24.0* 24.5* 23.0* 25.8*  MCV  --   --  89.7  --  89.6  PLT  --   --  165  --  169     Coagulation Studies: No results for input(s): LABPROT, INR in the last 72 hours.  Imaging Reviewed:     ASSESSMENT AND PLAN  Acute encephalopathy : Likely adverse reaction to anesthesia Acute stroke ruled out, EEG negative  for seizures  Recommendations Wean sedation as tolerated Resume Eliquis for secondary stroke prevention in the setting of atrial fibrillation   Juliane Guest [facility] Pager Number [phone] For questions after 7pm please refer to AMION to reach the Neurologist on call

## 2019-12-08 NOTE — Evaluation (Signed)
Physical Therapy Evaluation Patient Details Name: William Webb [facility] MRN: [id number] DOB: 1951-07-16 Today's Date: 12/08/2019   History of Present Illness  Pt is 69 yo male with hx of ESRD presented for revision of Lt basilic vein transposition fistula.  He was noted to have hypoxic and hypotensive during the procedure but was able to be extubated in PACU, but then had altered mental status with Rt > Lt sided weakness and required reintubation.  There was concern of seizure or CVA but on 2/8 MRI negative and EEG showed generalized slowing.  Pt was extubated in morning on 2/9.  Clinical Impression   Pt admitted with above diagnosis. Pt lethargic and required increased time to respond during PT eval.  He required min-mod A for transfers and ambulated 6' with RW.  Pt had some dizziness with transfers requiring increased time.  He did have decrease in BP with initial transfer (152/67 to 123/60) but recovered with increased time.  Pt lives alone and required mod A for transfers, so at this time recommending SNF; however, pt just extubated this am and this was first time OOB - will see how he progresses.  Pt currently with functional limitations due to the deficits listed below (see PT Problem List). Pt will benefit from skilled PT to increase their independence and safety with mobility to allow discharge to the venue listed below.       Follow Up Recommendations SNF;Supervision/Assistance - 24 hour    Equipment Recommendations  (TBD next venue)    Recommendations for Other Services       Precautions / Restrictions Precautions Precautions: Fall      Mobility  Bed Mobility Overal bed mobility: Needs Assistance Bed Mobility: Supine to Sit     Supine to sit: Mod assist;HOB elevated     General bed mobility comments: assist for legs and to boost trunk; increased time  Transfers Overall transfer level: Needs assistance Equipment used: Rolling walker (2 wheeled) Transfers: Sit to/from  [employer] Sit to Stand: Mod assist;Min assist   Squat pivot transfers: Mod assist     General transfer comment: Sit to stand from elevated bed with min A; from low chair mod A with 2 attempts and use of momentum; cues for hand placement  Ambulation/Gait Ambulation/Gait assistance: Min assist Gait Distance (Feet): 6 Feet Assistive device: Rolling walker (2 wheeled) Gait Pattern/deviations: Decreased stride length Gait velocity: decreased   General Gait Details: unsteady, reports of mild dizziness and increased time to respond; unsafe to ambulate further  Stairs            Wheelchair Mobility    Modified Rankin (Stroke Patients Only)       Balance Overall balance assessment: Needs assistance Sitting-balance support: Bilateral upper extremity supported;Feet supported Sitting balance-Leahy Scale: Fair     Standing balance support: Bilateral upper extremity supported;During functional activity Standing balance-Leahy Scale: Poor                               Pertinent Vitals/Pain Pain Assessment: No/denies pain    Home Living Family/patient expects to be discharged to:: Private residence Living Arrangements: Alone Available Help at Discharge: Family;Available PRN/intermittently Type of Home: Apartment Home Access: Level entry     Home Layout: One level Home Equipment: Grab bars - tub/shower      Prior Function Level of Independence: Independent         Comments: Reports independent with driving, adls, and  IADLs.  Does report if walking at store has to hold onto a cart, otherwise no AD.     Hand Dominance        Extremity/Trunk Assessment   Upper Extremity Assessment Upper Extremity Assessment: LUE deficits/detail;RUE deficits/detail RUE Deficits / Details: ROM WFL, slow to move, MMT elbow and hand 5/5 and shoulder 4/5 RUE Coordination: decreased gross motor LUE Deficits / Details: Limited tested due to surgical site  in upper arm. Demonstrating at least 3/5 in hand and elbow and 1/5 in shoulder. LUE Coordination: decreased gross motor    Lower Extremity Assessment Lower Extremity Assessment: LLE deficits/detail;RLE deficits/detail RLE Deficits / Details: ROM WFL, MMT 5/5; required increased time RLE Coordination: decreased gross motor LLE Deficits / Details: ROM WFL, MMT 5/5; required increased time       Communication   Communication: No difficulties  Cognition Arousal/Alertness: Awake/alert Behavior During Therapy: Flat affect Overall Cognitive Status: Within Functional Limits for tasks assessed                                 General Comments: Pt quite and at times required increased time to respond.      General Comments General comments (skin integrity, edema, etc.): Pt had removed O2 to bridge of nose at arrival and sats 99% with activity sats dropping to 90% on RA.  Placed back on 2 LPM with sats 99% after therapy.  HR ranged from 70-82 bpm.  BP in supine 157/69, sitting 152/67, after transfer to chair 123/60.  Pt required rest breaks after each transfer for dizziness to ease.  At end of therapy sitting in chair, no dizziness, and eating dinner.    Exercises     Assessment/Plan    PT Assessment Patient needs continued PT services  PT Problem List Decreased strength;Decreased mobility;Decreased safety awareness;Decreased range of motion;Decreased coordination;Decreased activity tolerance;Decreased cognition;Cardiopulmonary status limiting activity;Decreased balance;Decreased knowledge of use of DME       PT Treatment Interventions DME instruction;Therapeutic activities;Gait training;Therapeutic exercise;Patient/family education;Functional mobility training;Balance training    PT Goals (Current goals can be found in the Care Plan section)  Acute Rehab PT Goals Patient Stated Goal: return home but aggreable to rehab if needed PT Goal Formulation: With patient Time For  Goal Achievement: 12/22/19 Potential to Achieve Goals: Good    Frequency Min 3X/week   Barriers to discharge Decreased caregiver support      Co-evaluation               AM-PAC PT "6 Clicks" Mobility  Outcome Measure Help needed turning from your back to your side while in a flat bed without using bedrails?: A Little Help needed moving from lying on your back to sitting on the side of a flat bed without using bedrails?: A Lot Help needed moving to and from a bed to a chair (including a wheelchair)?: A Lot Help needed standing up from a chair using your arms (e.g., wheelchair or bedside chair)?: A Lot Help needed to walk in hospital room?: A Little Help needed climbing 3-5 steps with a railing? : A Lot 6 Click Score: 14    End of Session Equipment Utilized During Treatment: Gait belt;Oxygen Activity Tolerance: Patient tolerated treatment well Patient left: in chair;with call bell/phone within reach Nurse Communication: Mobility status(on whiteboard) PT Visit Diagnosis: Unsteadiness on feet (R26.81);Muscle weakness (generalized) (M62.81)    Time: 3716-9678 PT Time Calculation (min) (ACUTE ONLY): 30 min  Charges:   PT Evaluation $PT Eval Moderate Complexity: 1 Mod          Maggie Font, PT Acute Rehab Services Pager [phone] [facility] [phone] [facility] [phone]   Karlton Lemon 12/08/2019, 5:50 PM

## 2019-12-09 DIAGNOSIS — J9601 Acute respiratory failure with hypoxia: Secondary | ICD-10-CM

## 2019-12-09 DIAGNOSIS — I4819 Other persistent atrial fibrillation: Secondary | ICD-10-CM

## 2019-12-09 LAB — RENAL FUNCTION PANEL
Albumin: 2 g/dL — ABNORMAL LOW (ref 3.5–5.0)
Anion gap: 10 (ref 5–15)
BUN: 27 mg/dL — ABNORMAL HIGH (ref 8–23)
CO2: 22 mmol/L (ref 22–32)
Calcium: 7.9 mg/dL — ABNORMAL LOW (ref 8.9–10.3)
Chloride: 109 mmol/L (ref 98–111)
Creatinine, Ser: 2.77 mg/dL — ABNORMAL HIGH (ref 0.61–1.24)
GFR calc Af Amer: 26 mL/min — ABNORMAL LOW (ref >60)
GFR calc non Af Amer: 22 mL/min — ABNORMAL LOW (ref >60)
Glucose, Bld: 144 mg/dL — ABNORMAL HIGH (ref 70–99)
Phosphorus: 4.1 mg/dL (ref 2.5–4.6)
Potassium: 4 mmol/L (ref 3.5–5.1)
Sodium: 141 mmol/L (ref 135–145)

## 2019-12-09 LAB — GLUCOSE, CAPILLARY
Glucose-Capillary: 117 mg/dL — ABNORMAL HIGH (ref 70–99)
Glucose-Capillary: 165 mg/dL — ABNORMAL HIGH (ref 70–99)
Glucose-Capillary: 158 mg/dL — ABNORMAL HIGH (ref 70–99)

## 2019-12-09 LAB — CBC
HCT: 26.4 % — ABNORMAL LOW (ref 39.0–52.0)
Hemoglobin: 7.8 g/dL — ABNORMAL LOW (ref 13.0–17.0)
MCH: 26.7 pg (ref 26.0–34.0)
MCHC: 29.5 g/dL — ABNORMAL LOW (ref 30.0–36.0)
MCV: 90.4 fL (ref 80.0–100.0)
Platelets: 173 10*3/uL (ref 150–400)
RBC: 2.92 MIL/uL — ABNORMAL LOW (ref 4.22–5.81)
RDW: 16.9 % — ABNORMAL HIGH (ref 11.5–15.5)
WBC: 5.9 10*3/uL (ref 4.0–10.5)
nRBC: 0 % (ref 0.0–0.2)

## 2019-12-09 LAB — MAGNESIUM: Magnesium: 2 mg/dL (ref 1.7–2.4)

## 2019-12-09 MED ORDER — FUROSEMIDE 20 MG PO TABS
20.0000 mg | ORAL_TABLET | Freq: Two times a day (BID) | ORAL | Status: DC
  Administered 2019-12-09 – 2019-12-11 (×5): 20 mg via ORAL
  Filled 2019-12-09 (×5): qty 1

## 2019-12-09 NOTE — Progress Notes (Addendum)
NAME:  William Webb, MRN:  [id number], DOB:  1951/01/12, LOS: 2 ADMISSION DATE:  12/07/2019, CONSULTATION DATE:  12/07/2019 REFERRING MD:  Dr. Oneida Alar, [facility], CHIEF COMPLAINT:  Altered mental status  Brief History   69 yo male former smoker with hx of ESRD presented for revision of Lt basilic vein transposition fistula.  He was noted to have hypoxic and hypotensive during the procedure for about 7 to 10 minutes.  He was able to eventually complete the procedure.  He was extubated in PACU, but then had altered mental status with Rt > Lt sided weakness.  There was concern for stroke vs. seizure and neurology consulted.  He required reintubation. Workup was negative for CVA/seizure, and patient was extubated 2/9.    Past Medical History  ESRD, HTN, HLD, CAD s/p stent, DM type II, Persistent a fib, Glaucoma, GERD, Fibromyalgia, DM retinopathy, DM neuropathy  Significant Hospital Events   2/08 admit 2/09 extubated  Consults:  Neurology Nephrology  Procedures:  ETT 2/08 >> 2/09  Significant Diagnostic Tests:  CT head 2/08 >> no acute findings MRI brain 2/08 >> mild chronic small vessel ischemic disease EEG 2/08 >> generalized slowing  Micro Data:  SARS CoV2 TAT 2/06 >> negative  Antimicrobials:    Interim history/subjective:  Patient had 14 beat run of vtach o/n.  No chest pain or SOB.  K+ and Mg+ WNL.  Objective   Blood pressure (!) 173/76, pulse 78, temperature 98.1 F (36.7 C), temperature source Axillary, resp. rate 18, height 6\' 3"  (1.905 m), weight 97.3 kg, SpO2 96 %.        Intake/Output Summary (Last 24 hours) at 12/09/2019 1006 Last data filed at 12/09/2019 0800 Gross per 24 hour  Intake 796 ml  Output 1200 ml  Net -404 ml   Filed Weights   12/07/19 0613 12/08/19 0500 12/09/19 0432  Weight: 102.1 kg 100.6 kg 97.3 kg    Examination:  General - sitting up in bed eating breakfast Cardiac - RRR, systolic murmur Pulm - anteriorly clear to auscultation Abd -  non-distended Extremities - Lt arm fistula with palpable thrill Skin - venous stasis changes Neuro - alert and oriented to person and place but not time   Resolved Hospital Problem list     Assessment & Plan:   Acute hypoxic respiratory failure with compromised airway. - extubated 2/09 - goal SpO2 > 92%  Altered mental status. Likely from residual effects of anesthesia.  Workup negative for CVA/seizure.  Patient is alert and oriented to person and place today. - CTM  ESRD s/p fistula. - per renal and VVS  DM type II poorly controlled with neuropathy and retinopathy. - SSI - hold outpt neurontin  Hx of CAD, HTN, HLD, Permanent A fib. - cont outpt norvasc, coreg, eliquis, crestor - restart lasix - hold cozaar for now  Anemia of chronic disease and iron deficiency. - f/u CBC - continue feraheme - transfuse for Hb < 7 or significant bleeding   Best practice:  Diet: advanced diet DVT prophylaxis: eliquis GI prophylaxis: Protonix Mobility: bed rest Code Status: full code Disposition: ICU  Labs    CMP Latest Ref Rng & Units 12/09/2019 12/08/2019 12/07/2019  Glucose 70 - 99 mg/dL 144(H) 107(H) -  BUN 8 - 23 mg/dL 27(H) 27(H) -  Creatinine 0.61 - 1.24 mg/dL 2.77(H) 2.81(H) -  Sodium 135 - 145 mmol/L 141 141 141  Potassium 3.5 - 5.1 mmol/L 4.0 3.8 4.4  Chloride 98 - 111 mmol/L 109  108 -  CO2 22 - 32 mmol/L 22 22 -  Calcium 8.9 - 10.3 mg/dL 7.9(L) 7.9(L) -  Total Protein 6.5 - 8.1 g/dL - - -  Total Bilirubin 0.3 - 1.2 mg/dL - - -  Alkaline Phos 38 - 126 U/L - - -  AST 15 - 41 U/L - - -  ALT 0 - 44 U/L - - -    CBC Latest Ref Rng & Units 12/09/2019 12/08/2019 12/07/2019  WBC 4.0 - 10.5 K/uL 5.9 5.2 -  Hemoglobin 13.0 - 17.0 g/dL 7.8(L) 7.8(L) 7.8(L)  Hematocrit 39.0 - 52.0 % 26.4(L) 25.8(L) 23.0(L)  Platelets 150 - 400 K/uL 173 169 -    ABG    Component Value Date/Time   PHART 7.387 12/07/2019 1734   PCO2ART 41.7 12/07/2019 1734   PO2ART 65.0 (L) 12/07/2019 1734    HCO3 25.1 12/07/2019 1734   TCO2 26 12/07/2019 1734   O2SAT 92.0 12/07/2019 1734    CBG (last 3)  Recent Labs    12/08/19 2001 12/08/19 2143 12/09/19 0751  GLUCAP 153* 151* 117*    CC time 40 minutes  Mallie Darting, MS4

## 2019-12-09 NOTE — Progress Notes (Signed)
[facility] Progress Note Patient Name: William Webb [facility] DOB: February 16, 1951 MRN: [id number]   Date of Service  12/09/2019  HPI/Events of Note  Pt had a 14 beat run of wide complex tachycardia with spontaneous termination. Pt was hemodynamically stable.  eICU Interventions  Will check BMET, Mg++, PHOS        [name] [name] 12/09/2019, 2:46 AM

## 2019-12-09 NOTE — Progress Notes (Signed)
Pt had a 14 beat run of Vtach. Pt denies chest pain and SOB. VS stable. Elink made aware. Will continue to monitor.

## 2019-12-09 NOTE — Progress Notes (Signed)
Patient ID: William Webb [facility], male   DOB: 1951-03-12, 69 y.o.   MRN: [id number] [facility] Progress Note   Assessment/ Plan:   1.  Altered level of consciousness/acute encephalopathy:  Suspected to have been adverse reaction to anesthesia with work-up negative for CVA/seizure.  Extubated yesterday with mental status back to baseline. 2.  Chronic kidney disease stage IV: Renal function remains stable at his baseline creatinine of around 2.7 without acute electrolyte abnormality or indications for dialysis.  His peak creatinine was 3.0 likely arising from renal hypoperfusion injury from sustained hypotension.  Left revised brachiobasilic fistula status post transposition is patent. 3.  Acute hypoxic respiratory failure:  Extubated and without indications of pulmonary edema although he has edema over the extremities, will resume furosemide. 4.  Anemia of chronic kidney disease: Possibly compounded by some surgical losses, iron stores low without acute indications for PRBC transfusion.  Getting IV iron.  Subjective:   Extubated yesterday morning without problems.  Noted to have 14 beat run of ventricular tachycardia overnight-hemodynamically stable and otherwise asymptomatic.  Birthday today.   Objective:   BP (!) 163/71   Pulse 77   Temp 99.3 F (37.4 C) (Oral)   Resp 18   Ht 6\' 3"  (1.905 m)   Wt 97.3 kg   SpO2 95%   BMI 26.81 kg/m   Intake/Output Summary (Last 24 hours) at 12/09/2019 0644 Last data filed at 12/09/2019 0400 Gross per 24 hour  Intake 844.03 ml  Output 1600 ml  Net -755.97 ml   Weight change: -3.3 kg  Physical Exam: Gen: Resting comfortably in bed, awake/alert CVS: Pulse regular rhythm, normal rate, S1 and S2 normal Resp: Anteriorly clear to auscultation, no rales/rhonchi Abd: Soft, flat, nontender Ext: 1-2+ lower extremity edema with trace-1+ upper extremity edema.  Palpable thrill over left upper arm brachiobasilic fistula with some postoperative  edema  Imaging: MR ANGIO HEAD WO CONTRAST  Result Date: 12/07/2019 CLINICAL DATA:  Stroke follow-up. Altered mental status following surgery. EXAM: MRI HEAD WITHOUT CONTRAST MRA HEAD WITHOUT CONTRAST MRA NECK WITHOUT CONTRAST TECHNIQUE: Multiplanar, multiecho pulse sequences of the brain and surrounding structures were obtained without intravenous contrast. Angiographic images of the Circle of Willis were obtained using MRA technique without intravenous contrast. Angiographic images of the neck were obtained using MRA technique without intravenous contrast. Carotid stenosis measurements (when applicable) are obtained utilizing NASCET criteria, using the distal internal carotid diameter as the denominator. COMPARISON:  Head CT 12/27/2019 FINDINGS: MRI HEAD FINDINGS Brain: There is no evidence of acute infarct, mass, midline shift, or extra-axial fluid collection. Small foci of T2 hyperintensity in the cerebral white matter bilaterally are nonspecific but compatible with mild chronic small vessel ischemic disease. 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 A single chronic microhemorrhage is noted in the left cerebellum. There is mild cerebral atrophy. Vascular: Major intracranial vascular flow voids are preserved. Skull and upper cervical spine: Unremarkable bone marrow signal. Sinuses/Orbits: Right cataract extraction. Prominent right sphenoid sinus mucosal thickening. Moderate bilateral ethmoid air cell mucosal thickening. Clear mastoid air cells. Other: None. MRA HEAD FINDINGS The intracranial vertebral arteries are widely patent to the basilar. Patent P ICAs are seen bilaterally although the origin was not imaged on the left. Patent AICAs and SCA is are also seen bilaterally. The basilar artery is patent with mild stenosis versus artifact in its midportion. There is a small right posterior communicating artery. Both PCAs are patent without evidence of significant proximal  stenosis.  The internal carotid arteries are patent from skull base to carotid termini with mild supraclinoid stenosis on the left. ACAs and MCAs are patent without evidence of proximal branch occlusion or significant proximal stenosis. No aneurysm is identified. MRA NECK FINDINGS Assessment is limited by mild motion artifact and noncontrast technique. The aortic arch and proximal common carotid arteries were not included. The included portions of the common carotid and cervical internal carotid arteries are patent without evidence of significant stenosis. The vertebral arteries are patent and codominant with antegrade flow bilaterally. The right vertebral artery origin is suboptimally evaluated due to motion artifact, however the remainder of the vertebral arteries are widely patent bilaterally without evidence of significant stenosis or dissection. IMPRESSION: 1. No acute intracranial abnormality. 2. Mild chronic small vessel ischemic disease. 3. Evidence of mild intracranial atherosclerosis without large vessel occlusion or flow limiting proximal stenosis. 4. Widely patent cervical carotid and vertebral arteries. Electronically Signed   By: Logan Bores M.D.   On: 12/07/2019 17:46   MR ANGIO NECK WO CONTRAST  Result Date: 12/07/2019 CLINICAL DATA:  Stroke follow-up. Altered mental status following surgery. EXAM: MRI HEAD WITHOUT CONTRAST MRA HEAD WITHOUT CONTRAST MRA NECK WITHOUT CONTRAST TECHNIQUE: Multiplanar, multiecho pulse sequences of the brain and surrounding structures were obtained without intravenous contrast. Angiographic images of the Circle of Willis were obtained using MRA technique without intravenous contrast. Angiographic images of the neck were obtained using MRA technique without intravenous contrast. Carotid stenosis measurements (when applicable) are obtained utilizing NASCET criteria, using the distal internal carotid diameter as the denominator. COMPARISON:  Head CT 12/27/2019 FINDINGS:  MRI HEAD FINDINGS Brain: There is no evidence of acute infarct, mass, midline shift, or extra-axial fluid collection. Small foci of T2 hyperintensity in the cerebral white matter bilaterally are nonspecific but compatible with mild chronic small vessel ischemic disease. 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 A single chronic microhemorrhage is noted in the left cerebellum. There is mild cerebral atrophy. Vascular: Major intracranial vascular flow voids are preserved. Skull and upper cervical spine: Unremarkable bone marrow signal. Sinuses/Orbits: Right cataract extraction. Prominent right sphenoid sinus mucosal thickening. Moderate bilateral ethmoid air cell mucosal thickening. Clear mastoid air cells. Other: None. MRA HEAD FINDINGS The intracranial vertebral arteries are widely patent to the basilar. Patent P ICAs are seen bilaterally although the origin was not imaged on the left. Patent AICAs and SCA is are also seen bilaterally. The basilar artery is patent with mild stenosis versus artifact in its midportion. There is a small right posterior communicating artery. Both PCAs are patent without evidence of significant proximal stenosis. The internal carotid arteries are patent from skull base to carotid termini with mild supraclinoid stenosis on the left. ACAs and MCAs are patent without evidence of proximal branch occlusion or significant proximal stenosis. No aneurysm is identified. MRA NECK FINDINGS Assessment is limited by mild motion artifact and noncontrast technique. The aortic arch and proximal common carotid arteries were not included. The included portions of the common carotid and cervical internal carotid arteries are patent without evidence of significant stenosis. The vertebral arteries are patent and codominant with antegrade flow bilaterally. The right vertebral artery origin is suboptimally evaluated due to motion artifact, however the remainder of the vertebral  arteries are widely patent bilaterally without evidence of significant stenosis or dissection. IMPRESSION: 1. No acute intracranial abnormality. 2. Mild chronic small vessel ischemic disease. 3. Evidence of mild intracranial atherosclerosis without large vessel occlusion or flow limiting proximal  stenosis. 4. Widely patent cervical carotid and vertebral arteries. Electronically Signed   By: Logan Bores M.D.   On: 12/07/2019 17:46   MR BRAIN WO CONTRAST  Result Date: 12/07/2019 CLINICAL DATA:  Stroke follow-up. Altered mental status following surgery. EXAM: MRI HEAD WITHOUT CONTRAST MRA HEAD WITHOUT CONTRAST MRA NECK WITHOUT CONTRAST TECHNIQUE: Multiplanar, multiecho pulse sequences of the brain and surrounding structures were obtained without intravenous contrast. Angiographic images of the Circle of Willis were obtained using MRA technique without intravenous contrast. Angiographic images of the neck were obtained using MRA technique without intravenous contrast. Carotid stenosis measurements (when applicable) are obtained utilizing NASCET criteria, using the distal internal carotid diameter as the denominator. COMPARISON:  Head CT 12/27/2019 FINDINGS: MRI HEAD FINDINGS Brain: There is no evidence of acute infarct, mass, midline shift, or extra-axial fluid collection. Small foci of T2 hyperintensity in the cerebral white matter bilaterally are nonspecific but compatible with mild chronic small vessel ischemic disease. 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 A single chronic microhemorrhage is noted in the left cerebellum. There is mild cerebral atrophy. Vascular: Major intracranial vascular flow voids are preserved. Skull and upper cervical spine: Unremarkable bone marrow signal. Sinuses/Orbits: Right cataract extraction. Prominent right sphenoid sinus mucosal thickening. Moderate bilateral ethmoid air cell mucosal thickening. Clear mastoid air cells. Other: None. MRA HEAD  FINDINGS The intracranial vertebral arteries are widely patent to the basilar. Patent P ICAs are seen bilaterally although the origin was not imaged on the left. Patent AICAs and SCA is are also seen bilaterally. The basilar artery is patent with mild stenosis versus artifact in its midportion. There is a small right posterior communicating artery. Both PCAs are patent without evidence of significant proximal stenosis. The internal carotid arteries are patent from skull base to carotid termini with mild supraclinoid stenosis on the left. ACAs and MCAs are patent without evidence of proximal branch occlusion or significant proximal stenosis. No aneurysm is identified. MRA NECK FINDINGS Assessment is limited by mild motion artifact and noncontrast technique. The aortic arch and proximal common carotid arteries were not included. The included portions of the common carotid and cervical internal carotid arteries are patent without evidence of significant stenosis. The vertebral arteries are patent and codominant with antegrade flow bilaterally. The right vertebral artery origin is suboptimally evaluated due to motion artifact, however the remainder of the vertebral arteries are widely patent bilaterally without evidence of significant stenosis or dissection. IMPRESSION: 1. No acute intracranial abnormality. 2. Mild chronic small vessel ischemic disease. 3. Evidence of mild intracranial atherosclerosis without large vessel occlusion or flow limiting proximal stenosis. 4. Widely patent cervical carotid and vertebral arteries. Electronically Signed   By: Logan Bores M.D.   On: 12/07/2019 17:46   DG Chest Port 1 View  Result Date: 12/07/2019 CLINICAL DATA:  Endotracheal tube placement EXAM: PORTABLE CHEST 1 VIEW COMPARISON:  Radiograph 12/10/2018 FINDINGS: Endotracheal tube 3.2 cm from carina. Bilateral pleural effusions. No overt pulmonary edema. No pneumothorax. IMPRESSION: Endotracheal tube in good position.  Bilateral pleural effusions. Electronically Signed   By: Suzy Bouchard M.D.   On: 12/07/2019 16:48   CT HEAD CODE STROKE WO CONTRAST`  Result Date: 12/07/2019 CLINICAL DATA:  Code stroke.  Right arm weakness. EXAM: CT HEAD WITHOUT CONTRAST TECHNIQUE: Contiguous axial images were obtained from the base of the skull through the vertex without intravenous contrast. COMPARISON:  10/29/2018 FINDINGS: Brain: There is no evidence of acute infarct, intracranial hemorrhage, mass, midline shift, or  extra-axial fluid collection. Mild cerebral atrophy is unchanged. Scattered hypodensities in the cerebral white matter similar to the prior study and nonspecific but compatible with mild chronic small vessel ischemic disease. Vascular: Calcified atherosclerosis at the skull base. No hyperdense vessel. Skull: No fracture suspicious osseous lesion. Sinuses/Orbits: Extensive right sphenoid sinus mucosal thickening which is partly polypoid. Mild bilateral ethmoid sinus mucosal thickening. Clear mastoid air cells. Right cataract extraction. Other: None. ASPECTS [facility] Stroke Program Early CT Score) - Ganglionic level infarction (caudate, lentiform nuclei, internal capsule, insula, M1-M3 cortex): 7 - Supraganglionic infarction (M4-M6 cortex): 3 Total score (0-10 with 10 being normal): 10 IMPRESSION: 1. No evidence of acute intracranial abnormality. 2. ASPECTS is 10. These results were communicated to Dr. Lorraine Lax at 1:37 pm on 12/07/2019 by text page via the [facility] messaging system. Electronically Signed   By: Logan Bores M.D.   On: 12/07/2019 13:37    Labs: BMET Recent Labs  Lab 12/07/19 0706 12/07/19 1253 12/07/19 1619 12/07/19 1700 12/07/19 1734 12/08/19 0214 12/09/19 0358  NA 139 142  --  136 141 141 141  K 4.0 4.4  --  4.2 4.4 3.8 4.0  CL 110  --   --  105  --  108 109  CO2  --   --   --  21*  --  22 22  GLUCOSE 153*  --   --  161*  --  107* 144*  BUN 27*  --   --  28*  --  27* 27*  CREATININE 2.80*  --  2.92*  3.00*  --  2.81* 2.77*  CALCIUM  --   --   --  7.6*  --  7.9* 7.9*  PHOS  --   --   --   --   --  3.4 4.1   CBC Recent Labs  Lab 12/07/19 1619 12/07/19 1734 12/08/19 0214 12/09/19 0358  WBC 5.0  --  5.2 5.9  HGB 7.4* 7.8* 7.8* 7.8*  HCT 24.5* 23.0* 25.8* 26.4*  MCV 89.7  --  89.6 90.4  PLT 165  --  169 173    Medications:    . amLODipine  10 mg Oral Daily  . apixaban  5 mg Oral BID  . carvedilol  25 mg Oral BID  . Chlorhexidine Gluconate Cloth  6 each Topical Daily  . insulin aspart  0-5 Units Subcutaneous QHS  . insulin aspart  0-9 Units Subcutaneous TID WC  . pantoprazole (PROTONIX) IV  40 mg Intravenous QHS  . rosuvastatin  20 mg Oral q1800  . sodium chloride flush  3 mL Intravenous Q12H   Elmarie Shiley, MD 12/09/2019, 6:44 AM

## 2019-12-09 NOTE — Progress Notes (Signed)
Reason for consult: Encephalopathy   Subjective: Patient was extubated, comfortably sitting in bed and having his dinner.  Still slightly confused, unable to state his age.  Not oriented to surroundings.    ROS: negative except above   Examination  Vital signs in last 24 hours: Temp:  [98.1 F (36.7 C)-99.3 F (37.4 C)] 98.7 F (37.1 C) (02/10 1542) Pulse Rate:  [72-86] 74 (02/10 1600) Resp:  [15-27] 15 (02/10 1600) BP: (123-178)/(60-90) 158/73 (02/10 1600) SpO2:  [89 %-100 %] 95 % (02/10 1600) Weight:  [97.3 kg] 97.3 kg (02/10 0432)  General: Sitting in bed CVS: pulse-normal rate and rhythm RS: breathing comfortably Extremities: normal   Neuro: MS: Alert, oriented to himself, month. Confused to hospitalization CN: pupils equal and reactive,  EOMI, face symmetric, tongue midline, normal sensation over face, Motor: 5/5 strength in all 4 extremities Reflexes: 2+ bilaterally over patella, biceps, plantars: flexor Coordination: normal Gait: not tested  Basic Metabolic Panel: Recent Labs  Lab 12/07/19 0706 12/07/19 0706 12/07/19 1253 12/07/19 1619 12/07/19 1700 12/07/19 1734 12/08/19 0214 12/09/19 0358  NA 139   < > 142  --  136 141 141 141  K 4.0   < > 4.4  --  4.2 4.4 3.8 4.0  CL 110  --   --   --  105  --  108 109  CO2  --   --   --   --  21*  --  22 22  GLUCOSE 153*  --   --   --  161*  --  107* 144*  BUN 27*  --   --   --  28*  --  27* 27*  CREATININE 2.80*  --   --  2.92* 3.00*  --  2.81* 2.77*  CALCIUM  --   --   --   --  7.6*  --  7.9* 7.9*  MG  --   --   --   --   --   --   --  2.0  PHOS  --   --   --   --   --   --  3.4 4.1   < > = values in this interval not displayed.    CBC: Recent Labs  Lab 12/07/19 1253 12/07/19 1619 12/07/19 1734 12/08/19 0214 12/09/19 0358  WBC  --  5.0  --  5.2 5.9  HGB 8.2* 7.4* 7.8* 7.8* 7.8*  HCT 24.0* 24.5* 23.0* 25.8* 26.4*  MCV  --  89.7  --  89.6 90.4  PLT  --  165  --  169 173     Coagulation Studies: No  results for input(s): LABPROT, INR in the last 72 hours.  Imaging Reviewed:     ASSESSMENT AND PLAN  Acute encephalopathy secondary to anesthesia -Patient clinically improving -MRI brain and EEG negative  Neurology will be available as needed.    Karena Addison Ezabella Teska [facility] Pager Number [phone] For questions after 7pm please refer to AMION to reach the Neurologist on call

## 2019-12-09 NOTE — Progress Notes (Signed)
Patient received to room 4E17. VSS. Monitor on CCMD notified. CHG bath completed. Patient alert and oriented to room and call light. Call bell in reach.  Paulene Floor, RN

## 2019-12-09 NOTE — Progress Notes (Signed)
[facility]  Subjective  -patient feels okay just tired  Objective (!) 173/76 78 98.1 F (36.7 C) (Axillary) 18 96%  Intake/Output Summary (Last 24 hours) at 12/09/2019 2527 Last data filed at 12/09/2019 0800 Gross per 24 hour  Intake 801.89 ml  Output 1600 ml  Net -798.11 ml   Left upper extremity: Healing incisions palpable thrill in fistula no numbness or tingling in hands  Neuro: Alert oriented x3 upper extremity lower extremity motor function 5/5  Assessment/Planning: Status post second stage basilic transposition.  Patient with sustained altered consciousness after anesthesia.  This now seems to be resolved.  Patient can be transferred out to 4 E. today.  Most likely discharge home tomorrow if renal function is stable.  Appreciate input from critical care and nephrology service.  Ruta Hinds 12/09/2019 9:29 AM --  Laboratory Lab Results: Recent Labs    12/08/19 0214 12/09/19 0358  WBC 5.2 5.9  HGB 7.8* 7.8*  HCT 25.8* 26.4*  PLT 169 173   BMET Recent Labs    12/08/19 0214 12/09/19 0358  NA 141 141  K 3.8 4.0  CL 108 109  CO2 22 22  GLUCOSE 107* 144*  BUN 27* 27*  CREATININE 2.81* 2.77*  CALCIUM 7.9* 7.9*    COAG Lab Results  Component Value Date   INR 1.1 08/03/2019   INR 0.9 04/30/2019   INR 1.07 11/19/2017   No results found for: PTT

## 2019-12-10 ENCOUNTER — Encounter: Payer: Self-pay | Admitting: Anesthesiology

## 2019-12-10 LAB — CBC
HCT: 24.3 % — ABNORMAL LOW (ref 39.0–52.0)
Hemoglobin: 7.4 g/dL — ABNORMAL LOW (ref 13.0–17.0)
MCH: 27 pg (ref 26.0–34.0)
MCHC: 30.5 g/dL (ref 30.0–36.0)
MCV: 88.7 fL (ref 80.0–100.0)
Platelets: 171 10*3/uL (ref 150–400)
RBC: 2.74 MIL/uL — ABNORMAL LOW (ref 4.22–5.81)
RDW: 16.6 % — ABNORMAL HIGH (ref 11.5–15.5)
WBC: 4.8 10*3/uL (ref 4.0–10.5)
nRBC: 0 % (ref 0.0–0.2)

## 2019-12-10 LAB — GLUCOSE, CAPILLARY
Glucose-Capillary: 122 mg/dL — ABNORMAL HIGH (ref 70–99)
Glucose-Capillary: 147 mg/dL — ABNORMAL HIGH (ref 70–99)
Glucose-Capillary: 115 mg/dL — ABNORMAL HIGH (ref 70–99)
Glucose-Capillary: 121 mg/dL — ABNORMAL HIGH (ref 70–99)

## 2019-12-10 LAB — RENAL FUNCTION PANEL
Albumin: 2 g/dL — ABNORMAL LOW (ref 3.5–5.0)
Anion gap: 11 (ref 5–15)
BUN: 27 mg/dL — ABNORMAL HIGH (ref 8–23)
CO2: 22 mmol/L (ref 22–32)
Calcium: 8.1 mg/dL — ABNORMAL LOW (ref 8.9–10.3)
Chloride: 108 mmol/L (ref 98–111)
Creatinine, Ser: 2.72 mg/dL — ABNORMAL HIGH (ref 0.61–1.24)
GFR calc Af Amer: 26 mL/min — ABNORMAL LOW (ref >60)
GFR calc non Af Amer: 23 mL/min — ABNORMAL LOW (ref >60)
Glucose, Bld: 139 mg/dL — ABNORMAL HIGH (ref 70–99)
Phosphorus: 3.6 mg/dL (ref 2.5–4.6)
Potassium: 3.8 mmol/L (ref 3.5–5.1)
Sodium: 141 mmol/L (ref 135–145)

## 2019-12-10 MED ORDER — HYDRALAZINE HCL 20 MG/ML IJ SOLN
10.0000 mg | INTRAMUSCULAR | Status: DC | PRN
  Administered 2019-12-10 (×2): 20 mg via INTRAVENOUS
  Filled 2019-12-10 (×3): qty 1

## 2019-12-10 NOTE — Anesthesia Postprocedure Evaluation (Addendum)
Anesthesia Post Note  Patient: William Webb [facility]  Procedure(s) Performed: BASCILIC VEIN TRANSPOSITION LEFT ARM (Left Arm Upper)     Patient location during evaluation: PACU Anesthesia Type: General Level of consciousness: obtunded/minimal responses Pain management: pain level controlled Vital Signs Assessment: post-procedure vital signs reviewed and stable Cardiovascular status: stable and blood pressure returned to baseline Postop Assessment: no apparent nausea or vomiting Anesthetic complications: no Comments: Patient slow to arouse. Once more awake patient noncooperative, unable to follow commands and neurologic concerns for stroke/seizure prompted code stroke evaluation. Later intubated during CT imaging for failure to protect airway and underwent MRI without evidence of stroke (per Neuro), sent to ICU intubated for further management and evaluation.     Last Vitals:  Vitals:   12/10/19 0759 12/10/19 1015  BP: (!) 156/68 (!) 161/71  Pulse: 85   Resp: 20 18  Temp: 37 C   SpO2: 97%     Last Pain:  Vitals:   12/10/19 0759  TempSrc: Oral  PainSc: 0-No pain                 William Webb

## 2019-12-10 NOTE — Progress Notes (Signed)
Patient ID: William Webb [facility], male   DOB: [birth year], 68 y.o.   MRN: [id number] Stable overall.  Alert and oriented.  Reports minimal discomfort in his left arm.  No steal symptoms.  Excellent thrill.  Stable for discharge today

## 2019-12-10 NOTE — Anesthesia Postprocedure Evaluation (Signed)
Anesthesia Post Note  Patient: William Webb [facility]  Procedure(s) Performed: IR WITH ANESTHESIA (N/A )     Patient location during evaluation: SICU Anesthesia Type: General Level of consciousness: sedated Pain management: pain level controlled Vital Signs Assessment: post-procedure vital signs reviewed and stable Respiratory status: patient remains intubated per anesthesia plan Cardiovascular status: stable Postop Assessment: no apparent nausea or vomiting Anesthetic complications: no    Last Vitals:  Vitals:   12/10/19 0759 12/10/19 1015  BP: (!) 156/68 (!) 161/71  Pulse: 85   Resp: 20 18  Temp: 37 C   SpO2: 97%     Last Pain:  Vitals:   12/10/19 0759  TempSrc: Oral  PainSc: 0-No pain                 William Webb

## 2019-12-10 NOTE — TOC Initial Note (Signed)
Transition of Care [facility]) - Initial/Assessment Note    Patient Details  Name: William Webb MRN: [id number] Date of Birth: 1951-10-16  Transition of Care [facility]) CM/SW Contact:    Vinie Sill, [place] Phone Number: 12/10/2019, 4:10 PM  Clinical Narrative:                  CSW spoke with patient's spouse, William Webb. CSW introduced self and explained role. CSW discussed PT recommendations of ST rehab at [facility]. Patient spouse declined SNF. CSW was informed patient is expected to discharge home with his cousin, William Webb. She advised William Webb or his other cousin William Webb wil pick up the patient. She states William Webb address is [place] [address]. However, they are in the process of looking for another place. Patient's daughters(2) is expected to come over the weekend to assist with the patient's care.  Patient's family inquired about [place] services. CSW directed them to review Medicare.gov website to review for ratings and [place] choices.   Family states no there questions at this time. Will need to follow up with family for [facility] choice.   Patient will discharge home. Please contact patient's wife, William Webb, [phone] when patient is ready for discharge. She will coordinate transportation with family.   Thurmond Butts, MSW, [place] Clinical Social Worker   Thurmond Butts, MSW, [place] Clinical Social Worker    Expected Discharge Plan: [place] Barriers to Discharge: Continued Medical Work up   Patient Goals and CMS Choice        Expected Discharge Plan and Services Expected Discharge Plan: [place] In-house Referral: Clinical Social Work     Living arrangements for the past 2 months: Single Family Home Expected Discharge Date: 12/10/19                                    Prior Living Arrangements/Services Living arrangements for the past 2 months: Single Family Home Lives with:: Self Patient language and need for  interpreter reviewed:: No Do you feel safe going back to the place where you live?: Yes(going back with family support)      Need for Family Participation in Patient Care: Yes (Comment) Care giver support system in place?: Yes (comment)   Criminal Activity/Legal Involvement Pertinent to Current Situation/Hospitalization: No - Comment as needed  Activities of Daily Living Home Assistive Devices/Equipment: Cane (specify quad or straight), CBG Meter, Blood pressure cuff ADL Screening (condition at time of admission) Patient's cognitive ability adequate to safely complete daily activities?: Yes Is the patient deaf or have difficulty hearing?: No Does the patient have difficulty seeing, even when wearing glasses/contacts?: No Does the patient have difficulty concentrating, remembering, or making decisions?: No Patient able to express need for assistance with ADLs?: Yes Does the patient have difficulty dressing or bathing?: No Independently performs ADLs?: Yes (appropriate for developmental age) Does the patient have difficulty walking or climbing stairs?: Yes Weakness of Legs: Both Weakness of Arms/Hands: Both  Permission Sought/Granted Permission sought to share information with : Family Supports, Customer service manager, Case Optician, dispensing granted to share information with : Yes, Verbal Permission Granted  Share Information with NAME: Dereck Agerton  Permission granted to share info w AGENCY: [place] granted to share info w Relationship: wife  Permission granted to share info w Contact Information: [phone]  Emotional Assessment       Orientation: :  Oriented to Place, Oriented to Self Alcohol / Substance Use: Not Applicable Psych Involvement: No (comment)  Admission diagnosis:  Status post vascular surgery [Z98.890] Respiratory failure ([place]) [J96.90] Patient Active Problem List   Diagnosis Date Noted  . Status post vascular surgery  12/07/2019  . Respiratory failure ([place]) 12/07/2019  . H/O: GI bleed 07/10/2019  . CKD (chronic kidney disease) 06/08/2019  . Erectile dysfunction 05/06/2019  . Coronary artery disease involving native coronary artery of native heart without angina pectoris 04/05/2019  . Acute on chronic respiratory failure with hypoxia ([place]) 12/10/2018  . Healthcare-associated pneumonia 12/10/2018  . Hemoptysis 12/10/2018  . Sepsis ([place]) 12/10/2018  . Acute respiratory failure with hypoxia ([place]) 12/10/2018  . Malnutrition of moderate degree 12/02/2018  . CAD (coronary artery disease) 12/01/2018  . DOE (dyspnea on exertion) 11/18/2018  . Hypertension   . Hyperlipidemia   . Diabetes mellitus with renal complications ([place])   . Noncompliance with medication regimen   . Chronic pain syndrome 05/29/2018  . Type 2 diabetes mellitus with stage 3 chronic kidney disease, with long-term current use of insulin ([place]) 04/07/2018  . Stage 3a chronic kidney disease 03/11/2018  . Accident due to mechanical fall without injury 11/20/2017  . AKI (acute kidney injury) ([place]) 11/20/2017  . Normocytic normochromic anemia 11/20/2017  . Hypoglycemia 11/19/2017  . Essential hypertension 11/19/2017   PCP:  Alroy Dust, L.Marlou Sa, MD Pharmacy:   [id number] [address] [place] [address] Phone: [phone] Fax: [phone]     Social Determinants of Health (SDOH) Interventions    Readmission Risk Interventions No flowsheet data found.

## 2019-12-10 NOTE — Progress Notes (Signed)
Wife was concerned about patient leaving today. Spoke to [place] and patient can DC tomorrow 12/10/19. Paulene Floor, RN

## 2019-12-10 NOTE — Progress Notes (Signed)
Patient ID: William Webb [facility], male   DOB: [birth year], 69 y.o.   MRN: [id number] William Webb [facility] Progress Note   Assessment/ Plan:   1.  Altered level of consciousness/acute encephalopathy:  Suspected to have been adverse reaction to anesthesia with work-up negative for CVA/seizure.  Mental status improving gradually. 2.  Chronic kidney disease stage IV: Renal function remains stable at his baseline creatinine of around 2.7 without acute electrolyte abnormality or indications for dialysis.  His peak creatinine was 3.0 likely arising from renal hypoperfusion injury from sustained hypotension.  Left revised brachiobasilic fistula status post transposition is patent.  Stable enough to discharge from renal standpoint. 3.  Acute hypoxic respiratory failure:  Extubated and without indications of pulmonary edema, he is back on his usual dose of furosemide. 4.  Anemia of chronic kidney disease: Possibly compounded by some surgical losses, iron stores low without acute indications for PRBC transfusion.  Getting IV iron.  He is stable for discharge today from a renal standpoint to follow-up with [name] as an outpatient.  Subjective:   Transferred out of ICU yesterday, without any problems overnight.   Objective:   BP (!) 156/68 (BP Location: Right Arm)   Pulse 85   Temp 98.6 F (37 C) (Oral)   Resp 20   Ht 6\' 3"  (1.905 m)   Wt 99.4 kg   SpO2 97%   BMI 27.39 kg/m   Intake/Output Summary (Last 24 hours) at 12/10/2019 0910 Last data filed at 12/10/2019 0600 Gross per 24 hour  Intake 360 ml  Output 150 ml  Net 210 ml   Weight change: 2.1 kg  Physical Exam: Gen: Resting comfortably in bed, awake/alert CVS: Pulse regular rhythm, normal rate, S1 and S2 normal Resp: Anteriorly clear to auscultation, no rales/rhonchi Abd: Soft, flat, nontender Ext: 1-2+ lower extremity edema with trace-1+ upper extremity edema.  Palpable thrill over left upper arm brachiobasilic fistula with  some postoperative edema  Imaging: No results found.  Labs: BMET Recent Labs  Lab 12/07/19 0706 12/07/19 1253 12/07/19 1619 12/07/19 1700 12/07/19 1734 12/08/19 0214 12/09/19 0358 12/10/19 0337  NA 139 142  --  136 141 141 141 141  K 4.0 4.4  --  4.2 4.4 3.8 4.0 3.8  CL 110  --   --  105  --  108 109 108  CO2  --   --   --  21*  --  22 22 22   GLUCOSE 153*  --   --  161*  --  107* 144* 139*  BUN 27*  --   --  28*  --  27* 27* 27*  CREATININE 2.80*  --  2.92* 3.00*  --  2.81* 2.77* 2.72*  CALCIUM  --   --   --  7.6*  --  7.9* 7.9* 8.1*  PHOS  --   --   --   --   --  3.4 4.1 3.6   CBC Recent Labs  Lab 12/07/19 1619 12/07/19 1619 12/07/19 1734 12/08/19 0214 12/09/19 0358 12/10/19 0337  WBC 5.0  --   --  5.2 5.9 4.8  HGB 7.4*   < > 7.8* 7.8* 7.8* 7.4*  HCT 24.5*   < > 23.0* 25.8* 26.4* 24.3*  MCV 89.7  --   --  89.6 90.4 88.7  PLT 165  --   --  169 173 171   < > = values in this interval not displayed.    Medications:    .  amLODipine  10 mg Oral Daily  . apixaban  5 mg Oral BID  . carvedilol  25 mg Oral BID  . Chlorhexidine Gluconate Cloth  6 each Topical Daily  . furosemide  20 mg Oral BID  . insulin aspart  0-5 Units Subcutaneous QHS  . insulin aspart  0-9 Units Subcutaneous TID WC  . rosuvastatin  20 mg Oral q1800  . sodium chloride flush  3 mL Intravenous Q12H   Elmarie Shiley, MD 12/10/2019, 9:10 AM
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
## 2019-12-10 NOTE — Progress Notes (Signed)
Physical Therapy Treatment Patient Details Name: Randon Somera MRN: [id number] DOB: Feb 16, 1951 Today's Date: 12/10/2019    History of Present Illness Pt is a 69 y.o. male admitted 12/07/19 for revision of LUE fistula. Pt found to be hypoxic and hypotensive during procedure, extubated in PACU post-op, but then had AMS with R>L-side weakness, required reintubation. MRI 2/8 negative for acute abnormality. EEG suggestive of severe diffuse encephalopathy. Pt extubated 2/9. PMH includes ESRD, DM2, afib, OA, osteoporosis, glaucoma, diabetic retinopathy, CKD, CHF, CAD.   PT Comments    Pt progressing well with mobility. Trialled gait training with and without RW; pt's stability improved with RW, mobilizing at [place]. Educ re: therex (HEP handout provided), edema control, activity recommendations, importance of mobility. Pt declining follow-up PT services (SNF and [facility]), reports he plans to d/c to cousin's home for assist at d/c and daughters flying in to visit as well. RN to verify information to ensure safe d/c plans.     Follow Up Recommendations  SNF;Supervision for mobility/OOB(pt declined SNF & HHPT)     Equipment Recommendations  Rolling walker with 5" wheels    Recommendations for Other Services       Precautions / Restrictions Precautions Precautions: Fall Restrictions Weight Bearing Restrictions: No    Mobility  Bed Mobility Overal bed mobility: Independent                Transfers Overall transfer level: Needs assistance Equipment used: None;Rolling walker (2 wheeled) Transfers: Sit to/from Stand Sit to Stand: Supervision         General transfer comment: Trialled standing with and without RW, able to perform both at supervision-level; reliant on momentum and UE support to power into standing  Ambulation/Gait Ambulation/Gait assistance: Min guard;Supervision Gait Distance (Feet): 60 Feet Assistive device: None;Rolling walker (2 wheeled) Gait  Pattern/deviations: Step-through pattern;Decreased stride length;Trunk flexed   Gait velocity interpretation: 1.31 - 2.62 ft/sec, indicative of limited community ambulator General Gait Details: Initial gait trial without DME, min guard for balance due to stiff gait pattern and fall risk; additional gait trial with RW at supervision-level, noted stability improvements and pt reports feeling more confident with RW   Stairs             Wheelchair Mobility    Modified Rankin (Stroke Patients Only)       Balance Overall balance assessment: Needs assistance Sitting-balance support: Feet supported;No upper extremity supported Sitting balance-Leahy Scale: Fair       Standing balance-Leahy Scale: Fair Standing balance comment: Can static stand and ambulate without UE support                            Cognition Arousal/Alertness: Awake/alert Behavior During Therapy: Flat affect Overall Cognitive Status: No family/caregiver present to determine baseline cognitive functioning                                 General Comments: WFL for simple tasks; apparent slowed processing, at times taking increased time to respond      Exercises Other Exercises Other Exercises: Pt declining SNF and HHPT - HEP handout provided ([employer] Access Code: [email or web address]) including SLR, supine bridges, LAQ, seated hip flex, partial squats w/ counter support    General Comments        Pertinent Vitals/Pain Pain Assessment: Faces Pain Location: RUE > L hip Pain Descriptors / Indicators: Sore;Guarding Pain Intervention(s): Monitored  during session    Home Living                      Prior Function            PT Goals (current goals can now be found in the care plan section) Progress towards PT goals: Progressing toward goals    Frequency           PT Plan Discharge plan needs to be updated    Co-evaluation              AM-PAC PT "6 Clicks"  Mobility   Outcome Measure  Help needed turning from your back to your side while in a flat bed without using bedrails?: None Help needed moving from lying on your back to sitting on the side of a flat bed without using bedrails?: None Help needed moving to and from a bed to a chair (including a wheelchair)?: A Little Help needed standing up from a chair using your arms (e.g., wheelchair or bedside chair)?: A Little Help needed to walk in hospital room?: A Little Help needed climbing 3-5 steps with a railing? : A Little 6 Click Score: 20    End of Session   Activity Tolerance: Patient tolerated treatment well Patient left: in bed;with call bell/phone within reach;with bed alarm set Nurse Communication: Mobility status PT Visit Diagnosis: Unsteadiness on feet (R26.81);Muscle weakness (generalized) (M62.81)     Time: 9323-5573 PT Time Calculation (min) (ACUTE ONLY): 19 min  Charges:  $Therapeutic Exercise: 8-22 mins                    Mabeline Caras, PT, DPT Acute Rehabilitation Services  Pager [phone] Office [place] 12/10/2019, 12:29 PM

## 2019-12-11 ENCOUNTER — Encounter (HOSPITAL_COMMUNITY): Payer: Self-pay | Admitting: Vascular Surgery

## 2019-12-11 ENCOUNTER — Encounter (HOSPITAL_COMMUNITY): Payer: BC Managed Care – PPO

## 2019-12-11 LAB — RENAL FUNCTION PANEL
Albumin: 1.9 g/dL — ABNORMAL LOW (ref 3.5–5.0)
Anion gap: 7 (ref 5–15)
BUN: 28 mg/dL — ABNORMAL HIGH (ref 8–23)
CO2: 23 mmol/L (ref 22–32)
Calcium: 7.9 mg/dL — ABNORMAL LOW (ref 8.9–10.3)
Chloride: 110 mmol/L (ref 98–111)
Creatinine, Ser: 2.68 mg/dL — ABNORMAL HIGH (ref 0.61–1.24)
GFR calc Af Amer: 27 mL/min — ABNORMAL LOW (ref >60)
GFR calc non Af Amer: 23 mL/min — ABNORMAL LOW (ref >60)
Glucose, Bld: 151 mg/dL — ABNORMAL HIGH (ref 70–99)
Phosphorus: 3.6 mg/dL (ref 2.5–4.6)
Potassium: 4.2 mmol/L (ref 3.5–5.1)
Sodium: 140 mmol/L (ref 135–145)

## 2019-12-11 LAB — GLUCOSE, CAPILLARY
Glucose-Capillary: 139 mg/dL — ABNORMAL HIGH (ref 70–99)
Glucose-Capillary: 184 mg/dL — ABNORMAL HIGH (ref 70–99)

## 2019-12-11 NOTE — Plan of Care (Signed)
Continue to monitor

## 2019-12-11 NOTE — Progress Notes (Signed)
PT Cancellation Note  Patient Details Name: William Webb [facility] MRN: [id number] DOB: February 10, 1951   Cancelled Treatment:    Reason Eval/Treat Not Completed: Patient declined, no reason specified Pt refusing OOB mobility. "I am good, I got up already," declining despite explanation of importance of mobility. "I am ready to go home."   Raynelle Fujikawa A Olie Dibert 12/11/2019, 8:23 AM  Marisa Severin, PT, DPT Acute Rehabilitation Services Pager [phone] Office [phone]

## 2019-12-11 NOTE — Discharge Instructions (Signed)
[facility]  Discharge Instructions  AV Fistula or Graft Surgery for Dialysis Access  Please refer to the following instructions for your post-procedure care. Your surgeon or physician assistant will discuss any changes with you.  Activity  You may drive the day following your surgery, if you are comfortable and no longer taking prescription pain medication. Resume full activity as the soreness in your incision resolves.  Bathing/Showering  You may shower after you go home. Keep your incision dry for 48 hours. Do not soak in a bathtub, hot tub, or swim until the incision heals completely. You may not shower if you have a hemodialysis catheter.  Incision Care  Clean your incision with mild soap and water after 48 hours. Pat the area dry with a clean towel. You do not need a bandage unless otherwise instructed. Do not apply any ointments or creams to your incision. You may have skin glue on your incision. Do not peel it off. It will come off on its own in about one week. Your arm may swell a bit after surgery. To reduce swelling use pillows to elevate your arm so it is above your heart. Your doctor will tell you if you need to lightly wrap your arm with an ACE bandage.  Diet  Resume your normal diet. There are not special food restrictions following this procedure. In order to heal from your surgery, it is CRITICAL to get adequate nutrition. Your body requires vitamins, minerals, and protein. Vegetables are the best source of vitamins and minerals. Vegetables also provide the perfect balance of protein. Processed food has little nutritional value, so try to avoid this.  Medications  Resume taking all of your medications. If your incision is causing pain, you may take over-the counter pain relievers such as acetaminophen (Tylenol). If you were prescribed a stronger pain medication, please be aware these medications can cause nausea and constipation. Prevent  nausea by taking the medication with a snack or meal. Avoid constipation by drinking plenty of fluids and eating foods with high amount of fiber, such as fruits, vegetables, and grains. Do not take Tylenol if you are taking prescription pain medications.  Do Not restart taking your Eliquis until 12/08/2019   Follow up Your surgeon may want to see you in the office following your access surgery. If so, this will be arranged at the time of your surgery.  Please call us immediately for any of the following conditions:  Increased pain, redness, drainage (pus) from your incision site Fever of 101 degrees or higher Severe or worsening pain at your incision site Hand pain or numbness.  Reduce your risk of vascular disease:  Stop smoking. If you would like help, call [facility] at 1-800-QUIT-NOW 929-573-5168) or [place] at [place] your cholesterol Maintain a desired weight Control your diabetes Keep your blood pressure down  Dialysis  It will take several weeks to several months for your new dialysis access to be ready for use. Your surgeon will determine when it is OK to use it. Your nephrologist will continue to direct your dialysis. You can continue to use your Permcath until your new access is ready for use.  If you have any questions, please call the office at [phone].  ==============================================================================================  Information on my medicine - ELIQUIS (apixaban)  Why was Eliquis prescribed for you? Eliquis was prescribed for you to reduce the risk of a blood clot forming that can cause a stroke if you have a  medical condition called atrial fibrillation (a type of irregular heartbeat).  What do You need to know about Eliquis ? Take your Eliquis TWICE DAILY - one tablet in the morning and one tablet in the evening with or without food. If you have difficulty swallowing the tablet whole please discuss with  your pharmacist how to take the medication safely.  Take Eliquis exactly as prescribed by your doctor and DO NOT stop taking Eliquis without talking to the doctor who prescribed the medication.  Stopping may increase your risk of developing a stroke.  Refill your prescription before you run out.  After discharge, you should have regular check-up appointments with your healthcare provider that is prescribing your Eliquis.  In the future your dose may need to be changed if your kidney function or weight changes by a significant amount or as you get older.  What do you do if you miss a dose? If you miss a dose, take it as soon as you remember on the same day and resume taking twice daily.  Do not take more than one dose of ELIQUIS at the same time to make up a missed dose.  Important Safety Information A possible side effect of Eliquis is bleeding. You should call your healthcare provider right away if you experience any of the following: ? Bleeding from an injury or your nose that does not stop. ? Unusual colored urine (red or dark brown) or unusual colored stools (red or black). ? Unusual bruising for unknown reasons. ? A serious fall or if you hit your head (even if there is no bleeding).  Some medicines may interact with Eliquis and might increase your risk of bleeding or clotting while on Eliquis. To help avoid this, consult your healthcare provider or pharmacist prior to using any new prescription or non-prescription medications, including herbals, vitamins, non-steroidal anti-inflammatory drugs (NSAIDs) and supplements.  This website has more information on Eliquis (apixaban): http://www.eliquis.com/eliquis/home

## 2019-12-11 NOTE — TOC Transition Note (Signed)
Transition of Care [facility]) - CM/SW Discharge Note Marvetta Gibbons RN, BSN Transitions of Care Unit 4E- RN Case Manager [phone]   Patient Details  Name: William Webb [facility] MRN: [id number] Date of Birth: 1951/07/06  Transition of Care [facility]) CM/SW Contact:  Dawayne Patricia, RN Phone Number: 12/11/2019, 3:51 PM   Clinical Narrative:    Pt stable for transition home, f/u done with wife- Reino Bellis regarding transition plan- choice offered for [facility] agency Per CMS guidelines from medicare.gov website with star ratings (copy placed in shadow chart)- per Reino Bellis she does not have a preference for [facility] agency as long as they are in network with insurance- per wife- pt will need RW for home. Order has been placed- call made to [facility] with [employer] for DME need- RW to be delivered to room prior to discharge- per Reino Bellis - pt's cousin [name] Worthy to come pick pt up and transport home- pt to stay with other cousin Jaquelyn Bitter until daughter arrives on Sunday. [phone])- per Reino Bellis- daughter Shelbie Hutching will be here on Sunday and will be staying with pt at an Airbnb- locally - the address there will be [address] [place], [place] contact # [phone]- they will start at this address on Sunday 12/13/19 Call made to [facility] with Alvis Lemmings for [facility] referral- referral has been accepted for HHPT/aide-  Bedside RN to call cousin [name] for transport once DME has been delivered[employer] [phone]  Final next level of care: [place] Barriers to Discharge: Barriers Resolved   Patient Goals and CMS Choice Patient states their goals for this hospitalization and ongoing recovery are:: return home with family CMS Medicare.gov Compare Post Acute Care list provided to:: Patient Represenative (must comment)(wife- Harriet) Choice offered to / list presented to : Spouse  Discharge Placement             Home with [facility]          Discharge Plan and Services In-house  Referral: Clinical Social Work Discharge Planning Services: CM Consult Post Acute Care Choice: Home Health, Durable Medical Equipment          DME Arranged: [place] rolling DME Agency: [employer] Date DME Agency Contacted: 12/11/19 Time DME Agency Contacted: 55 Representative spoke with at DME Agency: [place]: PT, Nurse's Aide [place] Agency: [place] Date [place]: 12/11/19 Time [place]: 1430 Representative spoke with at [place]: [place] ([place]) Interventions     Readmission Risk Interventions Readmission Risk Prevention Plan 12/11/2019  Transportation Screening Complete  PCP or Specialist Appt within 5-7 Days Complete  Home Care Screening Complete  Medication Review (RN CM) Complete  Some recent data might be hidden

## 2019-12-11 NOTE — Progress Notes (Addendum)
  Progress Note    12/11/2019 7:26 AM 4 Days Post-Op  Subjective:  No complaints over night. Some swelling of right arm where IV is. No pain in left arm. No steal symptoms. Remains still somewhat confused about how he got to the hospital and the surgery he had   Vitals:   12/10/19 1958 12/11/19 0423  BP: (!) 161/71 (!) 164/72  Pulse: 77 75  Resp: 18 15  Temp: 99.5 F (37.5 C) 99.3 F (37.4 C)  SpO2: 97% 95%   Physical Exam: General: Looks well, not in discomfort Lungs: non labored Incisions:  Left arm incisions, intact, some clear fluid drainage from one of the more proximal incisions, small seroma present, non tender, no erythema. Palpable thrill in fistula. No numbness, tingling or coldness of hands Extremities: right forearm edematous, mildly tender where IV is. Normal strength and motor of bilateral upper extremities Abdomen:  Soft, nontender, not distended Neurologic: Alert and oriented  CBC    Component Value Date/Time   WBC 4.8 12/10/2019 0337   RBC 2.74 (L) 12/10/2019 0337   HGB 7.4 (L) 12/10/2019 0337   HCT 24.3 (L) 12/10/2019 0337   PLT 171 12/10/2019 0337   MCV 88.7 12/10/2019 0337   MCH 27.0 12/10/2019 0337   MCHC 30.5 12/10/2019 0337   RDW 16.6 (H) 12/10/2019 0337   LYMPHSABS 0.5 (L) 11/05/2019 1715   MONOABS 0.4 11/05/2019 1715   EOSABS 0.1 11/05/2019 1715   BASOSABS 0.0 11/05/2019 1715    BMET    Component Value Date/Time   NA 140 12/11/2019 0225   NA 140 09/22/2019 1507   K 4.2 12/11/2019 0225   CL 110 12/11/2019 0225   CO2 23 12/11/2019 0225   GLUCOSE 151 (H) 12/11/2019 0225   BUN 28 (H) 12/11/2019 0225   BUN 27 09/22/2019 1507   CREATININE 2.68 (H) 12/11/2019 0225   CREATININE 2.73 (H) 09/11/2019 1029   CALCIUM 7.9 (L) 12/11/2019 0225   GFRNONAA 23 (L) 12/11/2019 0225   GFRNONAA 23 (L) 09/11/2019 1029   GFRAA 27 (L) 12/11/2019 0225   GFRAA 26 (L) 09/11/2019 1029    INR    Component Value Date/Time   INR 1.1 08/03/2019 0610      Intake/Output Summary (Last 24 hours) at 12/11/2019 0726 Last data filed at 12/11/2019 0600 Gross per 24 hour  Intake 1040 ml  Output 725 ml  Net 315 ml     Assessment/Plan:  69 y.o. male is s/p  Second stage basilic transposition 4 Days Post-Op. Still some mild confusion but overall improved. Otherwise doing well. Okay to d/c home today with [facility]. He will follow up in the office in 3 weeks  DVT prophylaxis: Apixiban    Karoline Caldwell, PA-C [facility] [phone] 12/11/2019 7:26 AM   I have examined the patient, reviewed and agree with above.  Stable for discharge from vascular standpoint.  Excellent thrill.  Wounds are healing  Curt Jews, MD 12/11/2019 1:48 PM

## 2019-12-11 NOTE — Progress Notes (Addendum)
IV's discontinued at this time. Patient tolerated well. Spoke with patient's cousin, [name] who will be patient's transportation home to let him know patient was ready.   1730: Discharge instructions reviewed with patient and patient's cousin [name]. All questions answered.

## 2019-12-11 NOTE — Plan of Care (Signed)
Discharged to home

## 2019-12-14 ENCOUNTER — Other Ambulatory Visit: Payer: Self-pay

## 2019-12-14 DIAGNOSIS — J9690 Respiratory failure, unspecified, unspecified whether with hypoxia or hypercapnia: Secondary | ICD-10-CM | POA: Diagnosis not present

## 2019-12-14 NOTE — Patient Outreach (Signed)
[place] [facility]) Care Management  12/14/2019  [place] [birth year] [id number]   EMMI- Discharge RED ON EMMI ALERT Day # 1 Date: 12/13/19 Red Alert Reason:  Read discharge papers? No  Scheduled follow-up? No  Unfilled prescriptions? Yes  Other questions/problems? Yes    Outreach attempt: spoke with daughter Shelbie Hutching.  She is able to verify HIPAA.  She states that her dad is doing good.  She reports that they have been in contact with the physicians and waiting for call back for follow up appointments.  She states that home health to come by today.  Addressed red alerts.  She states no problems to either of those.  She and her sister are working to care for their dad and declines any further nurse follow up.      Plan: RN CM will close case.    Jone Baseman, RN, MSN [facility] Care Management Care Management Coordinator Direct Line [phone] Toll Free: [phone]  Fax: [phone]

## 2019-12-22 ENCOUNTER — Telehealth: Payer: Self-pay | Admitting: Endocrinology

## 2019-12-22 ENCOUNTER — Telehealth: Payer: Self-pay | Admitting: *Deleted

## 2019-12-22 NOTE — Telephone Encounter (Signed)
I reviewed pt's clinicals of 09/17/2019 and Dr. Paulla Dolly at that time felt the pain was due to the edema and recommended compression. I spoke with [place] and informed her of the clinical review and recommended that due to the continuation of the pain and the length of time between office visit pt would benefit from being reevaluated. [place] states she has be continuing the compression and would like to get pt in to be evaluated. I transferred to schedulers.

## 2019-12-22 NOTE — Telephone Encounter (Signed)
Please review and advise.

## 2019-12-22 NOTE — Telephone Encounter (Signed)
Please ask primary care provider about this.

## 2019-12-22 NOTE — Telephone Encounter (Signed)
Patients daughter called stating patient has been experiencing swollen ankles/feet and patient started complaining about this yesterday and patient could not sleep last night. Daughter did call patient's diabetic foot dr and left a message with them but she said she is "covering her bases" and calling all diabetes drs and is requesting to be called back at ph# [phone] regarding this matter.

## 2019-12-22 NOTE — Telephone Encounter (Signed)
Dtr returned call. Informed of Dr. Cordelia Pen response below. Verbalized acceptance and understanding.

## 2019-12-22 NOTE — Telephone Encounter (Signed)
Called [place] [phone] and LVM requesting returned.

## 2019-12-22 NOTE — Telephone Encounter (Signed)
Patient daughter called with questions on incision care. Reviewed information from discharge instructions with daughter. She verbalized understanding of all instructions.

## 2019-12-22 NOTE — Telephone Encounter (Signed)
Pt's dtr, [place] request information on how to care for pt's foot and ankle pain.

## 2019-12-24 ENCOUNTER — Ambulatory Visit: Payer: BC Managed Care – PPO | Admitting: Vascular Surgery

## 2019-12-31 ENCOUNTER — Ambulatory Visit (INDEPENDENT_AMBULATORY_CARE_PROVIDER_SITE_OTHER): Payer: Self-pay | Admitting: Physician Assistant

## 2019-12-31 ENCOUNTER — Other Ambulatory Visit: Payer: Self-pay

## 2019-12-31 VITALS — BP 126/69 | HR 62 | Temp 97.2°F | Resp 18 | Ht 75.0 in | Wt 182.0 lb

## 2019-12-31 DIAGNOSIS — N185 Chronic kidney disease, stage 5: Secondary | ICD-10-CM

## 2019-12-31 NOTE — Progress Notes (Signed)
    Postoperative Access Visit   History of Present Illness   William Webb is a 69 y.o. year old male who presents for postoperative follow-up for: left second stage basilic vein transposition by Dr. Oneida Alar (Date: 12/07/19).  The patient's wounds are healed.  The patient denies steal symptoms.  The patient is able to complete their activities of daily living.  He is not yet on HD.  CKD managed by [name].   Physical Examination   Vitals:   12/31/19 1413  BP: 126/69  Pulse: 62  Resp: 18  Temp: (!) 97.2 F (36.2 C)  TempSrc: Temporal  SpO2: 98%  Weight: 182 lb (82.6 kg)  Height: 6\' 3"  (1.905 m)   Body mass index is 22.75 kg/m.  left arm Incisions are healed, palpable radial pulse, hand grip is 5/5, sensation in digits is intact, palpable thrill, bruit can be auscultated     Medical Decision Making   William Webb is a 69 y.o. year old male who presents s/p left second stage basilic vein transposition   Patent L arm basilic fistula without signs or symptoms of steal syndrome  The patient's access will be ready for use 01/11/20  The patient may follow up on a prn basis   Dagoberto Ligas PA-C [facility] [place] Office: [phone]  Clinic MD: Oneida Alar

## 2020-01-01 ENCOUNTER — Encounter (HOSPITAL_COMMUNITY): Payer: BC Managed Care – PPO

## 2020-01-04 ENCOUNTER — Other Ambulatory Visit: Payer: Self-pay | Admitting: Family Medicine

## 2020-01-04 NOTE — Telephone Encounter (Signed)
Spoke with pt state that he has a new PCP, advised pt to update his pharmacy about his Rx refills since he is no longer under Dr Volanda Napoleon care, pt verbalized understanding state that he would call his PCP office

## 2020-01-05 ENCOUNTER — Other Ambulatory Visit: Payer: Self-pay

## 2020-01-05 DIAGNOSIS — D472 Monoclonal gammopathy: Secondary | ICD-10-CM

## 2020-01-06 ENCOUNTER — Inpatient Hospital Stay: Payer: BC Managed Care – PPO | Attending: Hematology

## 2020-01-06 ENCOUNTER — Other Ambulatory Visit: Payer: Self-pay

## 2020-01-06 ENCOUNTER — Inpatient Hospital Stay (HOSPITAL_BASED_OUTPATIENT_CLINIC_OR_DEPARTMENT_OTHER): Payer: BC Managed Care – PPO | Admitting: Hematology

## 2020-01-06 VITALS — BP 147/71 | HR 64 | Temp 99.1°F | Resp 17 | Ht 75.0 in | Wt 184.5 lb

## 2020-01-06 DIAGNOSIS — D472 Monoclonal gammopathy: Secondary | ICD-10-CM

## 2020-01-06 LAB — CMP (CANCER CENTER ONLY)
ALT: 12 U/L (ref 0–44)
AST: 13 U/L — ABNORMAL LOW (ref 15–41)
Albumin: 2.5 g/dL — ABNORMAL LOW (ref 3.5–5.0)
Alkaline Phosphatase: 98 U/L (ref 38–126)
Anion gap: 5 (ref 5–15)
BUN: 43 mg/dL — ABNORMAL HIGH (ref 8–23)
CO2: 24 mmol/L (ref 22–32)
Calcium: 8 mg/dL — ABNORMAL LOW (ref 8.9–10.3)
Chloride: 108 mmol/L (ref 98–111)
Creatinine: 2.9 mg/dL — ABNORMAL HIGH (ref 0.61–1.24)
GFR, Est AFR Am: 24 mL/min — ABNORMAL LOW (ref >60)
GFR, Estimated: 21 mL/min — ABNORMAL LOW (ref >60)
Glucose, Bld: 173 mg/dL — ABNORMAL HIGH (ref 70–99)
Potassium: 4.8 mmol/L (ref 3.5–5.1)
Sodium: 137 mmol/L (ref 135–145)
Total Bilirubin: 0.4 mg/dL (ref 0.3–1.2)
Total Protein: 6.3 g/dL — ABNORMAL LOW (ref 6.5–8.1)

## 2020-01-06 LAB — CBC WITH DIFFERENTIAL (CANCER CENTER ONLY)
Abs Immature Granulocytes: 0.01 10*3/uL (ref 0.00–0.07)
Basophils Absolute: 0 10*3/uL (ref 0.0–0.1)
Basophils Relative: 1 %
Eosinophils Absolute: 0.1 10*3/uL (ref 0.0–0.5)
Eosinophils Relative: 4 %
HCT: 29.6 % — ABNORMAL LOW (ref 39.0–52.0)
Hemoglobin: 9.7 g/dL — ABNORMAL LOW (ref 13.0–17.0)
Immature Granulocytes: 0 %
Lymphocytes Relative: 22 %
Lymphs Abs: 0.7 10*3/uL (ref 0.7–4.0)
MCH: 27.9 pg (ref 26.0–34.0)
MCHC: 32.8 g/dL (ref 30.0–36.0)
MCV: 85.1 fL (ref 80.0–100.0)
Monocytes Absolute: 0.2 10*3/uL (ref 0.1–1.0)
Monocytes Relative: 8 %
Neutro Abs: 2.1 10*3/uL (ref 1.7–7.7)
Neutrophils Relative %: 65 %
Platelet Count: 152 10*3/uL (ref 150–400)
RBC: 3.48 MIL/uL — ABNORMAL LOW (ref 4.22–5.81)
RDW: 15.7 % — ABNORMAL HIGH (ref 11.5–15.5)
WBC Count: 3.1 10*3/uL — ABNORMAL LOW (ref 4.0–10.5)
nRBC: 0 % (ref 0.0–0.2)

## 2020-01-06 NOTE — Progress Notes (Signed)
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 disease involving native coronary artery of native heart without angina pectoris 04/05/2019  . Acute on chronic respiratory failure with hypoxia ([place]) 12/10/2018  . Healthcare-associated pneumonia 12/10/2018  . Hemoptysis 12/10/2018  . Sepsis ([place]) 12/10/2018  . Acute respiratory failure with hypoxia ([place]) 12/10/2018  . Malnutrition of moderate degree 12/02/2018  . CAD (coronary artery disease) 12/01/2018  . DOE (dyspnea on exertion) 11/18/2018  . Hypertension   . Hyperlipidemia   . Diabetes mellitus with renal complications ([place])   . Noncompliance with medication regimen   . Chronic pain syndrome 05/29/2018  . Type 2 diabetes mellitus with stage 3 chronic kidney disease, with long-term current use  of insulin ([place]) 04/07/2018  . Stage 3a chronic kidney disease 03/11/2018  . Accident due to mechanical fall without injury 11/20/2017  . AKI (acute kidney injury) ([place]) 11/20/2017  . Normocytic normochromic anemia 11/20/2017  . Hypoglycemia 11/19/2017  . Essential hypertension 11/19/2017   -continue f/u with PCP  FOLLOW UP: RTC  with Dr. Irene Limbo as needed     The total time spent in the appt was 15 minutes and more than 50% was on counseling and direct patient cares.  All of the patient's questions were answered with apparent satisfaction. The patient knows to call the clinic with any problems, questions or concerns.    Sullivan Lone MD [place] AAHIVMS [facility] Hematology/Oncology Physician [facility]  (Office):       [phone] (Work cell):  [phone] (Fax):           [phone]  01/06/2020 4:49 PM  I, Yevette Edwards, am acting as a scribe for Dr. Sullivan Lone.   .I have reviewed the above documentation for accuracy and completeness, and I agree with the above. Brunetta Genera MD

## 2020-01-07 ENCOUNTER — Encounter: Payer: Self-pay | Admitting: Podiatry

## 2020-01-07 ENCOUNTER — Ambulatory Visit (INDEPENDENT_AMBULATORY_CARE_PROVIDER_SITE_OTHER): Payer: BC Managed Care – PPO | Admitting: Podiatry

## 2020-01-07 VITALS — Temp 97.5°F

## 2020-01-07 DIAGNOSIS — B351 Tinea unguium: Secondary | ICD-10-CM | POA: Diagnosis not present

## 2020-01-07 DIAGNOSIS — L97521 Non-pressure chronic ulcer of other part of left foot limited to breakdown of skin: Secondary | ICD-10-CM

## 2020-01-07 DIAGNOSIS — M79675 Pain in left toe(s): Secondary | ICD-10-CM | POA: Diagnosis not present

## 2020-01-07 DIAGNOSIS — M79674 Pain in right toe(s): Secondary | ICD-10-CM

## 2020-01-08 ENCOUNTER — Other Ambulatory Visit: Payer: Self-pay

## 2020-01-08 ENCOUNTER — Ambulatory Visit: Payer: BC Managed Care – PPO | Admitting: Cardiology

## 2020-01-08 ENCOUNTER — Encounter: Payer: Self-pay | Admitting: Cardiology

## 2020-01-08 VITALS — BP 151/78 | HR 62 | Ht 75.0 in | Wt 187.0 lb

## 2020-01-08 DIAGNOSIS — I4819 Other persistent atrial fibrillation: Secondary | ICD-10-CM

## 2020-01-08 DIAGNOSIS — I2510 Atherosclerotic heart disease of native coronary artery without angina pectoris: Secondary | ICD-10-CM

## 2020-01-08 DIAGNOSIS — L97521 Non-pressure chronic ulcer of other part of left foot limited to breakdown of skin: Secondary | ICD-10-CM | POA: Insufficient documentation

## 2020-01-08 DIAGNOSIS — I10 Essential (primary) hypertension: Secondary | ICD-10-CM

## 2020-01-08 DIAGNOSIS — N185 Chronic kidney disease, stage 5: Secondary | ICD-10-CM

## 2020-01-08 NOTE — Progress Notes (Signed)
Patient is here for follow up visit.  Subjective:   William Webb [facility], male    DOB: [birth year], 69 y.o.   MRN: [id number]   Chief Complaint  Patient presents with  . Coronary Artery Disease  . Hypertension  . Atrial Fibrillation   69 y/o William Webb male with uncontrolled hypertension, type 2 DM, hyperlipidemia, advanced CKD stage, persistent Afib, now in sinus rhythm s/p cardioversion.  Patient recently developing blister on his left foot second toe.  He saw podiatrist.  This was thought to be healing well.  He denies any sensation in his foot.  He does not have any pain.  Blood pressure is better controlled.    Current Outpatient Medications on File Prior to Visit  Medication Sig Dispense Refill  . acetaminophen (TYLENOL) 650 MG CR tablet Take 1,300 mg by mouth every 8 (eight) hours as needed for pain.    Marland Kitchen amLODipine (NORVASC) 10 MG tablet Take 1 tablet (10 mg total) by mouth daily. 90 tablet 3  . Blood Glucose Monitoring Suppl (ACCU-CHEK GUIDE ME) w/Device KIT 1 Device by Does not apply route 4 (four) times daily -  before meals and at bedtime. 1 kit 0  . Camphor-Eucalyptus-Menthol (VICKS VAPORUB EX) Apply 1 application topically daily as needed (congestion).    . carvedilol (COREG) 25 MG tablet Take 1 tablet (25 mg total) by mouth 2 (two) times daily. 180 tablet 1  . cholecalciferol (VITAMIN D) 25 MCG (1000 UT) tablet Take 1,000 Units by mouth daily.     . Continuous Blood Gluc Sensor (FREESTYLE LIBRE 14 DAY SENSOR) MISC 1 Device by Does not apply route every 14 (fourteen) days. 6 each 3  . Cyanocobalamin 2500 MCG CHEW Chew 2,500 mcg by mouth daily.    Marland Kitchen ELIQUIS 5 MG TABS tablet TAKE 1 TABLET BY MOUTH TWICE A DAY (Patient taking differently: Take 5 mg by mouth 2 (two) times daily. ) 60 tablet 6  . feeding supplement, GLUCERNA SHAKE, (GLUCERNA SHAKE) LIQD Take 237 mLs by mouth 3 (three) times daily between meals. (Patient taking differently: Take 237 mLs by mouth  daily. )  0  . furosemide (LASIX) 20 MG tablet Take 20 mg by mouth 2 (two) times daily.     Marland Kitchen gabapentin (NEURONTIN) 600 MG tablet TAKE 2 TABLETS BY MOUTH 3 TIMES A DAY (Patient taking differently: Take 300 mg by mouth daily. ) 180 tablet 3  . hydrALAZINE (APRESOLINE) 25 MG tablet SMARTSIG:3 Tablet(s) By Mouth Every Other Day PRN    . HYDROcodone-acetaminophen (NORCO/VICODIN) 5-325 MG tablet Take 1 tablet by mouth every 6 (six) hours as needed for moderate pain. 8 tablet 0  . insulin aspart (NOVOLOG FLEXPEN) 100 UNIT/ML FlexPen Inject 3 Units into the skin 3 (three) times daily with meals. (Patient taking differently: Inject 1-3 Units into the skin 3 (three) times daily as needed for high blood sugar. Monitor blood sugar free style libre range is on the meter) 15 mL 11  . linaclotide (LINZESS) 145 MCG CAPS capsule Take 1 capsule (145 mcg total) by mouth daily as needed (constipation). 30 capsule 0  . methocarbamol (ROBAXIN) 500 MG tablet TAKE 1 TABLET BY MOUTH TWICE DAILY AS NEEDED FOR MUSCLE SPASM (Patient taking differently: Take 500 mg by mouth 2 (two) times daily as needed for muscle spasms. ) 120 tablet 1  . montelukast (SINGULAIR) 10 MG tablet Take 1 tablet (10 mg total) by mouth at bedtime. 90 tablet 3  . omeprazole (PRILOSEC) 20 MG  capsule Take 20 mg by mouth daily.     . rosuvastatin (CRESTOR) 20 MG tablet TAKE 1 TABLET (20 MG TOTAL) BY MOUTH DAILY AT 6 PM. 90 tablet 1  . tadalafil (ADCIRCA/CIALIS) 20 MG tablet Take 1 tablet (20 mg total) by mouth daily as needed for erectile dysfunction. 10 tablet 0  . tamsulosin (FLOMAX) 0.4 MG CAPS capsule Take 0.4 mg by mouth at bedtime.    . vitamin E (VITAMIN E) 1000 UNIT capsule Take 1,000 Units by mouth daily.      No current facility-administered medications on file prior to visit.    Cardiovascular studies:  EKG 01/08/2020: Sinus rhythm 66 bpm. Left ventricular hypertrophy. ST-T changes related to LVH.  Coronary angiogram 10/20/2018: LM:  Normal LAD: Distal 70% diffuse disease.        Ostial 90% stenosis in Diag1. Patent prox Diag 1 stent LCx: Large OM1 with patent prior stent RCA: 100% occluded RPDA        Partially successful PTCA attempt        100%--99% stenosis        TIMI flow 0-I  LVEDP 16 mmHg  Echocardiogram 10/17/2018:  Left ventricle: The cavity size was normal. Wall thickness was   increased in a pattern of severe LVH. Systolic function was   normal. The estimated ejection fraction was in the range of 50%   to 55%. Wall motion was normal; there were no regional wall   motion abnormalities. Doppler parameters are consistent with   abnormal left ventricular relaxation (grade 1 diastolic   dysfunction). Doppler parameters are consistent with high   ventricular filling pressure. - Left atrium: The atrium was moderately dilated. - Right atrium: The atrium was mildly dilated.  Impressions:  - Low normal to mildly reduced LV systolic function; EF 50; severe   LVH; mild diastolic dysfunction; biatrial enlargement.  Labs: 12/11/2019: Glucose 151. BUN/Cr 28/2.6. HbA1C 7.7%.   11//2020: Glucose 177, BUN/Cr 27/2.72. EGFR 23. Na/K 140/4.5. Albumin 2.4 low. Rest of the CMP normal H/H 8.9/27.8. MCV 86. Platelets 177 HbA1C 8.0%  02/2018: Chol 162, TG 95, HDL 37, LDL 105  Review of Systems  Constitution: Negative for malaise/fatigue.  Cardiovascular: Positive for leg swelling (Improved). Negative for chest pain, dyspnea on exertion, palpitations and syncope.       LE ulcer   Respiratory: Negative for shortness of breath.   Genitourinary:       Erectile dysfunction  All other systems reviewed and are negative.      Objective:    Vitals:   01/08/20 1430  BP: (!) 150/79  Pulse: 66  SpO2: 96%     Physical Exam  Constitutional: No distress.  Neck: No JVD present.  Cardiovascular: Normal rate. An irregularly irregular rhythm present. Exam reveals decreased pulses.  Murmur (Flow murmur likely due  to LUE AV fistula) heard. Pulses:      Femoral pulses are 2+ on the right side and 2+ on the left side.      Popliteal pulses are 2+ on the right side and 1+ on the left side.       Dorsalis pedis pulses are 0 on the left side.       Posterior tibial pulses are 0 on the right side and 0 on the left side.  LUE AV fistula  Superficial ulcer LLE 2nd toe  Pulmonary/Chest: Effort normal and breath sounds normal. He has no wheezes. He has no rales.  Musculoskeletal:  General: No edema.  Nursing note and vitals reviewed.       Assessment & Recommendations:   69 y/o William Webb male with uncontrolled hypertension, type 2 DM, hyperlipidemia, advanced CKD stage, persistent Afib, now in sinus rhythm s/p cardioversion.   LE ulcer: Superficial, healing well. I will obtain lower extremity duplex for baseline evaluation.  I cautioned him regarding complaints of poor wound healing in the setting of PAD and diabetes.  He knows to monitor his wounds every day. Continue current medical therapy. Continue Crestor.  Check lipid panel.  Atrial fibrillation: Persistent, now in sinus rhythm s/p cardioversion 09/29/2019. CHA2DS2VASc score 4, annual stroke risk 5%. Continue eliquis 5 mg bid.   CAD: CAD s/p prior PCI, NSTEMI in 09/2018, attempted but unsuccessful. PTCA to chronically occluded Rt PDA Stable without angina symptoms. Given his advanced CKD, I do not recommend further revascualrization attempts.  Hypertension: Better control.  He is currently taking hydralazine 25 mg half tablet 3 times a day.  Increase to 1 tablet 3 times a day.  Continue carvedilol and amlodipine.  Mild bilateral carotid stenosis: Continue medical management  F/u in 4 weeks  Kinslee Dalpe Esther Hardy, MD [facility] [place] Pager: [phone] Office: [phone] If no answer Cell [phone]

## 2020-01-08 NOTE — Progress Notes (Signed)
Subjective:   Patient ID: William Webb, male   DOB: 69 y.o.   MRN: [id number]   HPI Patient presents stating he just got to the hospital was concerned about some discoloration on his left second toe and third and fourth toes and he has nails that cannot be taken care of   ROS      Objective:  Physical Exam  Neurovascular status diminished but no change from previous visit with patient having some crusted tissue on top of his left second toe with slight breakdown but localized with coolness to the digits.  He has thick yellow brittle nailbeds 1-5 both feet that he cannot cut     Assessment:  Patient with poor circulation poor health who has stress on the second digit with what appears to be a low-grade localized ulcer but no indications of gangrene of the digit currently with thick yellow brittle nailbeds 1-5 both feet     Plan:  H&P reviewed condition discussing this and I am hopeful will heal uneventfully and I gave instructions on soaks and open toed shoes.  Debrided nailbeds 1-5 both feet and patient will continue with routine care and was strongly encouraged if any changes in the second digit were to occur he is to let us know immediately and it is possible amputation will be necessary which I explained today

## 2020-01-11 ENCOUNTER — Ambulatory Visit (HOSPITAL_COMMUNITY)
Admission: RE | Admit: 2020-01-11 | Discharge: 2020-01-11 | Disposition: A | Discharge status: Home / Self Care | Payer: BC Managed Care – PPO | Source: Ambulatory Visit | Attending: Nephrology | Admitting: Nephrology

## 2020-01-11 VITALS — BP 144/71 | HR 67 | Temp 98.7°F | Resp 20

## 2020-01-11 DIAGNOSIS — N1831 Chronic kidney disease, stage 3a: Secondary | ICD-10-CM | POA: Insufficient documentation

## 2020-01-11 LAB — IRON AND TIBC
Iron: 50 ug/dL (ref 45–182)
Saturation Ratios: 22 % (ref 17.9–39.5)
TIBC: 228 ug/dL — ABNORMAL LOW (ref 250–450)
UIBC: 178 ug/dL

## 2020-01-11 LAB — FERRITIN: Ferritin: 420 ng/mL — ABNORMAL HIGH (ref 24–336)

## 2020-01-11 MED ORDER — EPOETIN ALFA-EPBX 10000 UNIT/ML IJ SOLN: 10000.0000 [IU] | INTRAMUSCULAR | Status: DC

## 2020-01-11 MED ORDER — EPOETIN ALFA-EPBX 10000 UNIT/ML IJ SOLN
INTRAMUSCULAR | Status: AC
  Administered 2020-01-11: 10000 [IU] via SUBCUTANEOUS
  Filled 2020-01-11: qty 1

## 2020-01-12 LAB — POCT HEMOGLOBIN-HEMACUE: Hemoglobin: 9.5 g/dL — ABNORMAL LOW (ref 13.0–17.0)

## 2020-01-13 ENCOUNTER — Other Ambulatory Visit: Payer: Self-pay | Admitting: Family Medicine

## 2020-01-15 ENCOUNTER — Encounter (HOSPITAL_COMMUNITY): Payer: BC Managed Care – PPO

## 2020-01-15 ENCOUNTER — Telehealth: Payer: Self-pay | Admitting: Endocrinology

## 2020-01-15 NOTE — Telephone Encounter (Signed)
Patient's daughter called asking if there was any way the patient could switch to another device rather than the [facility] - they keep having issues with the Freestyle.

## 2020-01-15 NOTE — Telephone Encounter (Signed)
Please call dtr to troubleshoot issues. In addition, pt insurance dictates what device is covered.

## 2020-01-18 ENCOUNTER — Ambulatory Visit: Payer: BC Managed Care – PPO

## 2020-01-18 ENCOUNTER — Other Ambulatory Visit: Payer: Self-pay

## 2020-01-18 DIAGNOSIS — L97521 Non-pressure chronic ulcer of other part of left foot limited to breakdown of skin: Secondary | ICD-10-CM

## 2020-01-18 DIAGNOSIS — I739 Peripheral vascular disease, unspecified: Secondary | ICD-10-CM

## 2020-01-20 NOTE — Telephone Encounter (Signed)
daughter

## 2020-01-25 ENCOUNTER — Other Ambulatory Visit: Payer: Self-pay

## 2020-01-25 ENCOUNTER — Ambulatory Visit (HOSPITAL_COMMUNITY)
Admission: RE | Admit: 2020-01-25 | Discharge: 2020-01-25 | Disposition: A | Discharge status: Home / Self Care | Payer: BC Managed Care – PPO | Source: Ambulatory Visit | Attending: Nephrology | Admitting: Nephrology

## 2020-01-25 ENCOUNTER — Telehealth: Payer: Self-pay

## 2020-01-25 VITALS — BP 133/67 | HR 67 | Temp 94.4°F | Resp 20

## 2020-01-25 DIAGNOSIS — N1831 Chronic kidney disease, stage 3a: Secondary | ICD-10-CM

## 2020-01-25 LAB — POCT HEMOGLOBIN-HEMACUE: Hemoglobin: 9.7 g/dL — ABNORMAL LOW (ref 13.0–17.0)

## 2020-01-25 MED ORDER — EPOETIN ALFA-EPBX 10000 UNIT/ML IJ SOLN
INTRAMUSCULAR | Status: AC
  Administered 2020-01-25: 10000 [IU] via SUBCUTANEOUS
  Filled 2020-01-25: qty 1

## 2020-01-25 MED ORDER — EPOETIN ALFA-EPBX 10000 UNIT/ML IJ SOLN: 10000.0000 [IU] | INTRAMUSCULAR | Status: DC

## 2020-01-25 NOTE — Telephone Encounter (Signed)
Called and spoke with patient regarding his ultrasound results. He also requested a reminder call for his upcoming appt.

## 2020-01-25 NOTE — Telephone Encounter (Signed)
-----   Message from Nigel Mormon, MD sent at 01/24/2020  9:48 AM EDT ----- Hi Mr. [place],  As I expected, leg artery ultrasound does show reduced circulation in your legs. How is the skin breakdown on your foot? We may need further workup to improve leg circulation.  Regards, Dr. Virgina Jock

## 2020-01-26 NOTE — Telephone Encounter (Signed)
Continuation of note on 3/24.  Spoke with daughter and gave her the telephone number to call for Dexcom to see if her father's insurance will pay for this, and what it will cost.  She had no final questions.

## 2020-01-26 NOTE — Telephone Encounter (Signed)
Patient reports that he has heard nothing from dexcom.Shari Heritage faxed again to [place] for insurance inquiry

## 2020-02-04 NOTE — Telephone Encounter (Signed)
Please see below.

## 2020-02-04 NOTE — Telephone Encounter (Signed)
Daughter called today to find out where we are with her dad receiving the Dexcom- I read the notes from Vaughan Basta S to her and advised her to contact insurance to see if the Dexcom will be covered-daughter stated she will have her mother reach out to the insurance company to find out if Dexcom is covered but she would like to know what else can be done to speed this process along-please contact wife to f/u on this

## 2020-02-05 ENCOUNTER — Other Ambulatory Visit: Payer: Self-pay

## 2020-02-05 ENCOUNTER — Ambulatory Visit: Payer: BC Managed Care – PPO | Admitting: Cardiology

## 2020-02-05 ENCOUNTER — Encounter: Payer: Self-pay | Admitting: Cardiology

## 2020-02-05 VITALS — BP 160/72 | HR 66 | Temp 97.3°F | Ht 75.0 in | Wt 194.5 lb

## 2020-02-05 DIAGNOSIS — I4819 Other persistent atrial fibrillation: Secondary | ICD-10-CM

## 2020-02-05 DIAGNOSIS — N185 Chronic kidney disease, stage 5: Secondary | ICD-10-CM

## 2020-02-05 DIAGNOSIS — I2510 Atherosclerotic heart disease of native coronary artery without angina pectoris: Secondary | ICD-10-CM

## 2020-02-05 DIAGNOSIS — L97521 Non-pressure chronic ulcer of other part of left foot limited to breakdown of skin: Secondary | ICD-10-CM

## 2020-02-05 NOTE — Progress Notes (Signed)
Patient is here for follow up visit.  Subjective:   Raiyan Dalesandro [facility], male    DOB: 1951/01/05, 69 y.o.   MRN: [id number]   Chief Complaint  Patient presents with  . Atrial Fibrillation  . Coronary Artery Disease   69 y/o [place] male with uncontrolled hypertension, type 2 DM, hyperlipidemia, advanced CKD stage, persistent Afib, now in sinus rhythm s/p cardioversion.  Patient recently developing blister on his left foot second toe. This is currently healing well.  He saw podiatrist.  This was thought to be healing well.  He denies any sensation in his foot.  He does not have any pain.  Recent ABI results discussed with the patient, details below.  Blood pressure elevated today. He has f/u with nephrologist next week.   Current Outpatient Medications on File Prior to Visit  Medication Sig Dispense Refill  . acetaminophen (TYLENOL) 650 MG CR tablet Take 1,300 mg by mouth every 8 (eight) hours as needed for pain.    Marland Kitchen amLODipine (NORVASC) 10 MG tablet Take 1 tablet (10 mg total) by mouth daily. 90 tablet 3  . Blood Glucose Monitoring Suppl (ACCU-CHEK GUIDE ME) w/Device KIT 1 Device by Does not apply route 4 (four) times daily -  before meals and at bedtime. 1 kit 0  . Camphor-Eucalyptus-Menthol (VICKS VAPORUB EX) Apply 1 application topically daily as needed (congestion).    . cholecalciferol (VITAMIN D) 25 MCG (1000 UT) tablet Take 1,000 Units by mouth daily.     . Continuous Blood Gluc Sensor (FREESTYLE LIBRE 14 DAY SENSOR) MISC 1 Device by Does not apply route every 14 (fourteen) days. 6 each 3  . Cyanocobalamin 2500 MCG CHEW Chew 2,500 mcg by mouth daily.    Marland Kitchen ELIQUIS 5 MG TABS tablet TAKE 1 TABLET BY MOUTH TWICE A DAY (Patient taking differently: Take 5 mg by mouth 2 (two) times daily. ) 60 tablet 6  . feeding supplement, GLUCERNA SHAKE, (GLUCERNA SHAKE) LIQD Take 237 mLs by mouth 3 (three) times daily between meals. (Patient taking differently: Take 237 mLs by mouth  daily. )  0  . hydrALAZINE (APRESOLINE) 25 MG tablet SMARTSIG:3 Tablet(s) By Mouth Every Other Day PRN    . HYDROcodone-acetaminophen (NORCO/VICODIN) 5-325 MG tablet Take 1 tablet by mouth every 6 (six) hours as needed for moderate pain. 8 tablet 0  . insulin aspart (NOVOLOG FLEXPEN) 100 UNIT/ML FlexPen Inject 3 Units into the skin 3 (three) times daily with meals. (Patient taking differently: Inject 1-3 Units into the skin 3 (three) times daily as needed for high blood sugar. Monitor blood sugar free style libre range is on the meter) 15 mL 11  . linaclotide (LINZESS) 145 MCG CAPS capsule Take 1 capsule (145 mcg total) by mouth daily as needed (constipation). 30 capsule 0  . methocarbamol (ROBAXIN) 500 MG tablet TAKE 1 TABLET BY MOUTH TWICE DAILY AS NEEDED FOR MUSCLE SPASM (Patient taking differently: Take 500 mg by mouth 2 (two) times daily as needed for muscle spasms. ) 120 tablet 1  . montelukast (SINGULAIR) 10 MG tablet Take 1 tablet (10 mg total) by mouth at bedtime. 90 tablet 3  . omeprazole (PRILOSEC) 20 MG capsule Take 20 mg by mouth daily.     . rosuvastatin (CRESTOR) 20 MG tablet TAKE 1 TABLET (20 MG TOTAL) BY MOUTH DAILY AT 6 PM. 90 tablet 1  . tadalafil (ADCIRCA/CIALIS) 20 MG tablet Take 1 tablet (20 mg total) by mouth daily as needed for erectile dysfunction.  10 tablet 0  . tamsulosin (FLOMAX) 0.4 MG CAPS capsule Take 0.4 mg by mouth at bedtime.    . vitamin E (VITAMIN E) 1000 UNIT capsule Take 1,000 Units by mouth daily.     . carvedilol (COREG) 25 MG tablet Take 1 tablet (25 mg total) by mouth 2 (two) times daily. 180 tablet 1   No current facility-administered medications on file prior to visit.    Cardiovascular studies:  Lower Extremity Arterial Duplex 01/18/2020:  No hemodynamically significant stenosis noted in the bilateral common and  superficial femoral arteries.  Bilateral anterior and post tibial spectral waveform demonstrates a  monophasic flow pattern suggestive of  severe diffuse small vessel disease.  This exam reveals normal perfusion of the right and left lower extremity  (ABI 0.97).   The ABI may be falsely elevated in a patient with arteriosclerosis.  Consider further work up for CLI if clinically indicated.  EKG 01/08/2020: Sinus rhythm 66 bpm. Left ventricular hypertrophy. ST-T changes related to LVH.  Coronary angiogram 10/20/2018: LM: Normal LAD: Distal 70% diffuse disease.        Ostial 90% stenosis in Diag1. Patent prox Diag 1 stent LCx: Large OM1 with patent prior stent RCA: 100% occluded RPDA        Partially successful PTCA attempt        100%--99% stenosis        TIMI flow 0-I  LVEDP 16 mmHg  Echocardiogram 10/17/2018:  Left ventricle: The cavity size was normal. Wall thickness was   increased in a pattern of severe LVH. Systolic function was   normal. The estimated ejection fraction was in the range of 50%   to 55%. Wall motion was normal; there were no regional wall   motion abnormalities. Doppler parameters are consistent with   abnormal left ventricular relaxation (grade 1 diastolic   dysfunction). Doppler parameters are consistent with high   ventricular filling pressure. - Left atrium: The atrium was moderately dilated. - Right atrium: The atrium was mildly dilated.  Impressions:  - Low normal to mildly reduced LV systolic function; EF 50; severe   LVH; mild diastolic dysfunction; biatrial enlargement.  Labs: 12/11/2019: Glucose 151. BUN/Cr 28/2.6. HbA1C 7.7%.   11//2020: Glucose 177, BUN/Cr 27/2.72. EGFR 23. Na/K 140/4.5. Albumin 2.4 low. Rest of the CMP normal H/H 8.9/27.8. MCV 86. Platelets 177 HbA1C 8.0%   02/2018: Chol 162, TG 95, HDL 37, LDL 105  Review of Systems  Constitution: Negative for malaise/fatigue.  Cardiovascular: Positive for leg swelling (Improved). Negative for chest pain, dyspnea on exertion, palpitations and syncope.       LE ulcer   Respiratory: Negative for shortness of  breath.   Genitourinary:       Erectile dysfunction  All other systems reviewed and are negative.      Objective:    Vitals:   02/05/20 1342  BP: (!) 160/72  Pulse: 66  Temp: (!) 97.3 F (36.3 C)  SpO2: 96%     Physical Exam  Constitutional: No distress.  Neck: No JVD present.  Cardiovascular: Normal rate. An irregularly irregular rhythm present. Exam reveals decreased pulses.  Murmur (Flow murmur likely due to LUE AV fistula) heard. Pulses:      Femoral pulses are 2+ on the right side and 2+ on the left side.      Popliteal pulses are 2+ on the right side and 1+ on the left side.       Dorsalis pedis pulses are 0 on the  left side.       Posterior tibial pulses are 0 on the right side and 0 on the left side.  LUE AV fistula  Superficial ulcer LLE 2nd toe  Pulmonary/Chest: Effort normal and breath sounds normal. He has no wheezes. He has no rales.  Musculoskeletal:        General: Edema (1+b/l) present.  Nursing note and vitals reviewed.       Assessment & Recommendations:   69 y/o [place] male with uncontrolled hypertension, type 2 DM, hyperlipidemia, advanced CKD stage, persistent Afib, now in sinus rhythm s/p cardioversion.   LE ulcer: Superficial, healing well. He likely has severe PAD< especially below the knee. His ulcer has healed well, he does not have any claudication. He has advanced CKD, but not on dialysis yet. CO2 angiogram will likely be inadequate to evaluate and intervene on below the knee disease. In order to preserve his renal function, I would hold off contrast angiogram unless he develps critical limb ischemia (nonhealing ulcer, resting pain, gangrene). I counseled patient to check for any injuries on his feet on a daily basis. If any new wounds appears, he should contact us immediately.  Continue current medical therapy, including, statin Not on statin due to ongoing use of eliquis and prior h/o GI bleeding.   Atrial  fibrillation: Persistent, now in sinus rhythm s/p cardioversion 09/29/2019. CHA2DS2VASc score 4, annual stroke risk 5%. Continue eliquis 5 mg bid.   CAD: CAD s/p prior PCI, NSTEMI in 09/2018, attempted but unsuccessful. PTCA to chronically occluded Rt PDA Stable without angina symptoms. Given his advanced CKD, I do not recommend further revascualrization attempts.  Hypertension: Advanced CKD. Defer management to nephrology.   Mild bilateral carotid stenosis: Continue medical management  F/u in 6 weeks  Daleysa Kristiansen Esther Hardy, MD [facility] Pager: [phone] Office: [phone] If no answer Cell [phone]

## 2020-02-08 ENCOUNTER — Encounter (HOSPITAL_COMMUNITY): Payer: BC Managed Care – PPO

## 2020-02-16 ENCOUNTER — Ambulatory Visit (HOSPITAL_COMMUNITY)
Admission: RE | Admit: 2020-02-16 | Discharge: 2020-02-16 | Disposition: A | Discharge status: Home / Self Care | Payer: BC Managed Care – PPO | Source: Ambulatory Visit | Attending: Nephrology | Admitting: Nephrology

## 2020-02-16 ENCOUNTER — Other Ambulatory Visit: Payer: Self-pay

## 2020-02-16 VITALS — BP 156/72 | HR 63 | Temp 95.3°F | Resp 20

## 2020-02-16 DIAGNOSIS — N1831 Chronic kidney disease, stage 3a: Secondary | ICD-10-CM | POA: Insufficient documentation

## 2020-02-16 LAB — IRON AND TIBC
Iron: 60 ug/dL (ref 45–182)
Saturation Ratios: 30 % (ref 17.9–39.5)
TIBC: 202 ug/dL — ABNORMAL LOW (ref 250–450)
UIBC: 142 ug/dL

## 2020-02-16 LAB — POCT HEMOGLOBIN-HEMACUE: Hemoglobin: 9.9 g/dL — ABNORMAL LOW (ref 13.0–17.0)

## 2020-02-16 LAB — FERRITIN: Ferritin: 292 ng/mL (ref 24–336)

## 2020-02-16 MED ORDER — EPOETIN ALFA-EPBX 10000 UNIT/ML IJ SOLN: 10000.0000 [IU] | INTRAMUSCULAR | Status: DC

## 2020-02-16 MED ORDER — EPOETIN ALFA-EPBX 10000 UNIT/ML IJ SOLN
INTRAMUSCULAR | Status: AC
  Administered 2020-02-16: 10000 [IU] via SUBCUTANEOUS
  Filled 2020-02-16: qty 1

## 2020-02-22 ENCOUNTER — Encounter (HOSPITAL_COMMUNITY): Payer: BC Managed Care – PPO

## 2020-03-01 ENCOUNTER — Other Ambulatory Visit: Payer: Self-pay

## 2020-03-01 ENCOUNTER — Ambulatory Visit (HOSPITAL_COMMUNITY)
Admission: RE | Admit: 2020-03-01 | Discharge: 2020-03-01 | Disposition: A | Discharge status: Home / Self Care | Payer: BC Managed Care – PPO | Source: Ambulatory Visit | Attending: Nephrology | Admitting: Nephrology

## 2020-03-01 VITALS — BP 134/57 | HR 61

## 2020-03-01 DIAGNOSIS — N1831 Chronic kidney disease, stage 3a: Secondary | ICD-10-CM | POA: Diagnosis not present

## 2020-03-01 LAB — POCT HEMOGLOBIN-HEMACUE: Hemoglobin: 9.3 g/dL — ABNORMAL LOW (ref 13.0–17.0)

## 2020-03-01 MED ORDER — EPOETIN ALFA-EPBX 10000 UNIT/ML IJ SOLN: 10000.0000 [IU] | INTRAMUSCULAR | Status: DC

## 2020-03-01 MED ORDER — EPOETIN ALFA-EPBX 10000 UNIT/ML IJ SOLN
INTRAMUSCULAR | Status: AC
  Administered 2020-03-01: 10000 [IU]
  Filled 2020-03-01: qty 1

## 2020-03-09 NOTE — Telephone Encounter (Signed)
Patient reports that he has still not heard/received anything from [facility].  He also notes that he has moved.  He was given the phone number to [place] to check status of order, and to update them on his new address.  He says his daughter will do this.

## 2020-03-15 ENCOUNTER — Other Ambulatory Visit: Payer: Self-pay

## 2020-03-15 ENCOUNTER — Ambulatory Visit (HOSPITAL_COMMUNITY)
Admission: RE | Admit: 2020-03-15 | Discharge: 2020-03-15 | Disposition: A | Discharge status: Home / Self Care | Payer: BC Managed Care – PPO | Source: Ambulatory Visit | Attending: Nephrology | Admitting: Nephrology

## 2020-03-15 VITALS — BP 154/73 | HR 66 | Temp 97.0°F | Resp 20

## 2020-03-15 DIAGNOSIS — N1831 Chronic kidney disease, stage 3a: Secondary | ICD-10-CM | POA: Insufficient documentation

## 2020-03-15 LAB — IRON AND TIBC
Iron: 60 ug/dL (ref 45–182)
Saturation Ratios: 26 % (ref 17.9–39.5)
TIBC: 231 ug/dL — ABNORMAL LOW (ref 250–450)
UIBC: 171 ug/dL

## 2020-03-15 LAB — FERRITIN: Ferritin: 183 ng/mL (ref 24–336)

## 2020-03-15 LAB — POCT HEMOGLOBIN-HEMACUE: Hemoglobin: 11 g/dL — ABNORMAL LOW (ref 13.0–17.0)

## 2020-03-15 MED ORDER — EPOETIN ALFA-EPBX 10000 UNIT/ML IJ SOLN: 20000.0000 [IU] | INTRAMUSCULAR | Status: DC

## 2020-03-15 MED ORDER — EPOETIN ALFA-EPBX 10000 UNIT/ML IJ SOLN
INTRAMUSCULAR | Status: AC
  Administered 2020-03-15: 20000 [IU] via SUBCUTANEOUS
  Filled 2020-03-15: qty 2

## 2020-03-16 NOTE — Progress Notes (Signed)
Patient is here for follow up visit.  Subjective:   William Webb [facility], male    DOB: 1951/09/15, 69 y.o.   MRN: [id number]   Chief Complaint  Patient presents with  . PAD  . Coronary Artery Disease  . Atrial Fibrillation  . Hypertension  . Follow-up    6week   HPI  69 y.o. African American male with uncontrolled hypertension, type 2 DM, hyperlipidemia, advanced CKD stage, persistent Afib, now in sinus rhythm s/p cardioversion  Blood pressure improved, but remains suboptimal. He has regular follow up with nephrology. Toe blisters have healed. He does not have lifestyle limiting claudication.  Current Outpatient Medications on File Prior to Visit  Medication Sig Dispense Refill  . acetaminophen (TYLENOL) 650 MG CR tablet Take 1,300 mg by mouth every 8 (eight) hours as needed for pain.    Marland Kitchen amLODipine (NORVASC) 10 MG tablet Take 1 tablet (10 mg total) by mouth daily. 90 tablet 3  . Blood Glucose Monitoring Suppl (ACCU-CHEK GUIDE ME) w/Device KIT 1 Device by Does not apply route 4 (four) times daily -  before meals and at bedtime. 1 kit 0  . Camphor-Eucalyptus-Menthol (VICKS VAPORUB EX) Apply 1 application topically daily as needed (congestion).    . carvedilol (COREG) 25 MG tablet Take 1 tablet (25 mg total) by mouth 2 (two) times daily. 180 tablet 1  . cholecalciferol (VITAMIN D) 25 MCG (1000 UT) tablet Take 1,000 Units by mouth daily.     . Continuous Blood Gluc Sensor (FREESTYLE LIBRE 14 DAY SENSOR) MISC 1 Device by Does not apply route every 14 (fourteen) days. 6 each 3  . Cyanocobalamin 2500 MCG CHEW Chew 2,500 mcg by mouth daily.    Marland Kitchen ELIQUIS 5 MG TABS tablet TAKE 1 TABLET BY MOUTH TWICE A DAY (Patient taking differently: Take 5 mg by mouth 2 (two) times daily. ) 60 tablet 6  . feeding supplement, GLUCERNA SHAKE, (GLUCERNA SHAKE) LIQD Take 237 mLs by mouth 3 (three) times daily between meals. (Patient taking differently: Take 237 mLs by mouth daily. )  0  . furosemide  (LASIX) 40 MG tablet Take 40 mg by mouth every other day.    . furosemide (LASIX) 80 MG tablet Take 80 mg by mouth every other day.    . gabapentin (NEURONTIN) 300 MG capsule Take 300 mg by mouth at bedtime.    . hydrALAZINE (APRESOLINE) 25 MG tablet Take 25 mg by mouth daily.     Marland Kitchen HYDROcodone-acetaminophen (NORCO/VICODIN) 5-325 MG tablet Take 1 tablet by mouth every 6 (six) hours as needed for moderate pain. 8 tablet 0  . insulin aspart (NOVOLOG FLEXPEN) 100 UNIT/ML FlexPen Inject 3 Units into the skin 3 (three) times daily with meals. (Patient taking differently: Inject 1-3 Units into the skin 3 (three) times daily as needed for high blood sugar. Monitor blood sugar free style libre range is on the meter) 15 mL 11  . linaclotide (LINZESS) 145 MCG CAPS capsule Take 1 capsule (145 mcg total) by mouth daily as needed (constipation). 30 capsule 0  . methocarbamol (ROBAXIN) 500 MG tablet TAKE 1 TABLET BY MOUTH TWICE DAILY AS NEEDED FOR MUSCLE SPASM (Patient taking differently: Take 500 mg by mouth 2 (two) times daily as needed for muscle spasms. ) 120 tablet 1  . montelukast (SINGULAIR) 10 MG tablet Take 1 tablet (10 mg total) by mouth at bedtime. 90 tablet 3  . omeprazole (PRILOSEC) 20 MG capsule Take 20 mg by mouth daily.     Marland Kitchen  rosuvastatin (CRESTOR) 20 MG tablet TAKE 1 TABLET (20 MG TOTAL) BY MOUTH DAILY AT 6 PM. 90 tablet 1  . tadalafil (ADCIRCA/CIALIS) 20 MG tablet Take 1 tablet (20 mg total) by mouth daily as needed for erectile dysfunction. 10 tablet 0  . tamsulosin (FLOMAX) 0.4 MG CAPS capsule Take 0.4 mg by mouth at bedtime.    . vitamin E (VITAMIN E) 1000 UNIT capsule Take 1,000 Units by mouth daily.      No current facility-administered medications on file prior to visit.    Cardiovascular studies:  Lower Extremity Arterial Duplex 01/18/2020:  No hemodynamically significant stenosis noted in the bilateral common and  superficial femoral arteries.  Bilateral anterior and post tibial  spectral waveform demonstrates a  monophasic flow pattern suggestive of severe diffuse small vessel disease.  This exam reveals normal perfusion of the right and left lower extremity  (ABI 0.97).   The ABI may be falsely elevated in a patient with arteriosclerosis.  Consider further work up for CLI if clinically indicated.  EKG 01/08/2020: Sinus rhythm 66 bpm. Left ventricular hypertrophy. ST-T changes related to LVH.  Coronary angiogram 10/20/2018: LM: Normal LAD: Distal 70% diffuse disease.        Ostial 90% stenosis in Diag1. Patent prox Diag 1 stent LCx: Large OM1 with patent prior stent RCA: 100% occluded RPDA        Partially successful PTCA attempt        100%--99% stenosis        TIMI flow 0-I  LVEDP 16 mmHg  Echocardiogram 10/17/2018: Left ventricle: The cavity size was normal. Wall thickness was increased in a pattern of severe LVH. Systolic function was normal. The estimated ejection fraction was in the range of 50% to 55%. Wall motion was normal; there were no regional wall motion abnormalities. Doppler parameters are consistent with anormal left ventricular relaxation (grade 1 diastolic dysfunction). Doppler parameters are consistent with high ventricular filling pressure. - Left atrium: The atrium was moderately dilated. - Right atrium: The atrium was mildly dilated.  Impressions: - Low normal to mildly reduced LV systolic function; EF 50; severe LVH; mild diastolic dysfunction; biatrial enlargement.  Recent Labs: 03/01/2020: Hemoglobin 11  Iron 60, TIBC 231.  01/06/2020: Glucose 173, BUN/Cr 43/2.9.  Na/K 137/4.8. Rest of the CMP normal  12/11/2019: Glucose 151. BUN/Cr 28/2.6. HbA1C 7.7%.   11//2020: Glucose 177, BUN/Cr 27/2.72. EGFR 23. Na/K 140/4.5. Albumin 2.4 low. Rest of the CMP normal H/H 8.9/27.8. MCV 86. Platelets 177 HbA1C 8.0%   02/2018: Chol 162, TG 95, HDL 37, LDL 105  Review of Systems  Constitution: Negative for malaise/fatigue.    Cardiovascular: Positive for leg swelling (Improved). Negative for chest pain, dyspnea on exertion, palpitations and syncope.       LE ulcer   Respiratory: Negative for shortness of breath.   Genitourinary:       Erectile dysfunction  All other systems reviewed and are negative.      Objective:    Vitals:   03/18/20 1427  BP: (!) 157/80  Pulse: 74  SpO2: 92%     Physical Exam  Constitutional: No distress.  Neck: No JVD present.  Cardiovascular: Normal rate. An irregularly irregular rhythm present. Exam reveals decreased pulses.  Murmur (Flow murmur likely due to LUE AV fistula) heard. Pulses:      Femoral pulses are 2+ on the right side and 2+ on the left side.      Popliteal pulses are 2+ on the right side  and 1+ on the left side.       Dorsalis pedis pulses are 0 on the left side.       Posterior tibial pulses are 0 on the right side and 0 on the left side.  LUE AV fistula  Superficial ulcer LLE 2nd toe  Pulmonary/Chest: Effort normal and breath sounds normal. He has no wheezes. He has no rales.  Musculoskeletal:        General: Edema (1+b/l) present.  Nursing note and vitals reviewed.       Assessment & Recommendations:   69 y.o. African American male with uncontrolled hypertension, type 2 DM, hyperlipidemia, advanced CKD stage, persistent Afib, now in sinus rhythm s/p cardioversion, severe PAD.   PAD: Toe ulcer healed well.  He likely has severe PAD< especially below the knee. His ulcer has healed well, he does not have any claudication. He has advanced CKD, but not on dialysis yet. CO2 angiogram will likely be inadequate to evaluate and intervene on below the knee disease. In order to preserve his renal function, I would hold off contrast angiogram unless he develps critical limb ischemia (nonhealing ulcer, resting pain, gangrene). I counseled patient to check for any injuries on his feet on a daily basis. If any new wounds appears, he should contact us  immediately.  Continue current medical therapy, including, statin Not on statin due to ongoing use of eliquis and prior h/o GI bleeding.   Atrial fibrillation: Persistent, now in sinus rhythm s/p cardioversion 09/29/2019. CHA2DS2VASc score 4, annual stroke risk 5%. Continue eliquis 5 mg bid.   CAD: CAD s/p prior PCI, NSTEMI in 09/2018, attempted but unsuccessful. PTCA to chronically occluded Rt PDA Stable without angina symptoms. Given his advanced CKD, I do not recommend further revascualrization attempts.  Hypertension: Switch hydralazine to Bidil 20-37.5 mg tid, since he is not using cialis anymore.  Mild bilateral carotid stenosis: Continue medical management  F/u in 3 months  [place], MD [facility] Pager: [phone] Office: [phone] If no answer Cell [phone]

## 2020-03-18 ENCOUNTER — Ambulatory Visit: Payer: BC Managed Care – PPO | Admitting: Cardiology

## 2020-03-18 ENCOUNTER — Other Ambulatory Visit: Payer: Self-pay

## 2020-03-18 ENCOUNTER — Encounter: Payer: Self-pay | Admitting: Cardiology

## 2020-03-18 VITALS — BP 157/80 | HR 74 | Ht 75.0 in | Wt 188.9 lb

## 2020-03-18 DIAGNOSIS — I739 Peripheral vascular disease, unspecified: Secondary | ICD-10-CM

## 2020-03-18 DIAGNOSIS — I10 Essential (primary) hypertension: Secondary | ICD-10-CM

## 2020-03-18 DIAGNOSIS — I2510 Atherosclerotic heart disease of native coronary artery without angina pectoris: Secondary | ICD-10-CM

## 2020-03-18 MED ORDER — BIDIL 20-37.5 MG PO TABS: 1.0000 | ORAL_TABLET | Freq: Three times a day (TID) | ORAL | 3 refills | Status: DC

## 2020-03-19 ENCOUNTER — Encounter: Payer: Self-pay | Admitting: Cardiology

## 2020-03-19 DIAGNOSIS — I739 Peripheral vascular disease, unspecified: Secondary | ICD-10-CM | POA: Insufficient documentation

## 2020-03-21 ENCOUNTER — Other Ambulatory Visit: Payer: Self-pay

## 2020-03-21 DIAGNOSIS — I10 Essential (primary) hypertension: Secondary | ICD-10-CM

## 2020-03-29 ENCOUNTER — Ambulatory Visit (HOSPITAL_COMMUNITY)
Admission: RE | Admit: 2020-03-29 | Discharge: 2020-03-29 | Disposition: A | Discharge status: Home / Self Care | Payer: BC Managed Care – PPO | Source: Ambulatory Visit | Attending: Nephrology | Admitting: Nephrology

## 2020-03-29 ENCOUNTER — Other Ambulatory Visit: Payer: Self-pay

## 2020-03-29 VITALS — BP 178/82 | HR 70 | Temp 97.0°F | Resp 20

## 2020-03-29 DIAGNOSIS — N1831 Chronic kidney disease, stage 3a: Secondary | ICD-10-CM | POA: Diagnosis not present

## 2020-03-29 LAB — POCT HEMOGLOBIN-HEMACUE: Hemoglobin: 11.6 g/dL — ABNORMAL LOW (ref 13.0–17.0)

## 2020-03-29 MED ORDER — EPOETIN ALFA-EPBX 10000 UNIT/ML IJ SOLN
INTRAMUSCULAR | Status: AC
  Administered 2020-03-29: 20000 [IU]
  Filled 2020-03-29: qty 2

## 2020-03-29 MED ORDER — EPOETIN ALFA-EPBX 10000 UNIT/ML IJ SOLN: 20000.0000 [IU] | INTRAMUSCULAR | Status: DC

## 2020-04-12 ENCOUNTER — Other Ambulatory Visit: Payer: Self-pay

## 2020-04-12 ENCOUNTER — Encounter (HOSPITAL_COMMUNITY)
Admission: RE | Admit: 2020-04-12 | Discharge: 2020-04-12 | Disposition: A | Discharge status: Home / Self Care | Payer: BC Managed Care – PPO | Source: Ambulatory Visit | Attending: Nephrology | Admitting: Nephrology

## 2020-04-12 VITALS — BP 111/47 | HR 60 | Temp 95.0°F

## 2020-04-12 DIAGNOSIS — N1831 Chronic kidney disease, stage 3a: Secondary | ICD-10-CM

## 2020-04-12 LAB — IRON AND TIBC
Iron: 52 ug/dL (ref 45–182)
Saturation Ratios: 24 % (ref 17.9–39.5)
TIBC: 221 ug/dL — ABNORMAL LOW (ref 250–450)
UIBC: 169 ug/dL

## 2020-04-12 LAB — FERRITIN: Ferritin: 163 ng/mL (ref 24–336)

## 2020-04-12 LAB — POCT HEMOGLOBIN-HEMACUE: Hemoglobin: 10.2 g/dL — ABNORMAL LOW (ref 13.0–17.0)

## 2020-04-12 MED ORDER — EPOETIN ALFA-EPBX 10000 UNIT/ML IJ SOLN
INTRAMUSCULAR | Status: AC
  Filled 2020-04-12: qty 2

## 2020-04-12 MED ORDER — EPOETIN ALFA-EPBX 10000 UNIT/ML IJ SOLN
10000.0000 [IU] | INTRAMUSCULAR | Status: DC
  Administered 2020-04-12: 10000 [IU] via SUBCUTANEOUS

## 2020-04-15 DIAGNOSIS — I129 Hypertensive chronic kidney disease with stage 1 through stage 4 chronic kidney disease, or unspecified chronic kidney disease: Secondary | ICD-10-CM | POA: Diagnosis not present

## 2020-04-15 DIAGNOSIS — Z9181 History of falling: Secondary | ICD-10-CM | POA: Diagnosis not present

## 2020-04-15 DIAGNOSIS — E1140 Type 2 diabetes mellitus with diabetic neuropathy, unspecified: Secondary | ICD-10-CM | POA: Diagnosis not present

## 2020-04-15 DIAGNOSIS — R634 Abnormal weight loss: Secondary | ICD-10-CM | POA: Diagnosis not present

## 2020-04-15 DIAGNOSIS — E1122 Type 2 diabetes mellitus with diabetic chronic kidney disease: Secondary | ICD-10-CM | POA: Diagnosis not present

## 2020-04-15 DIAGNOSIS — L720 Epidermal cyst: Secondary | ICD-10-CM | POA: Diagnosis not present

## 2020-04-15 DIAGNOSIS — E7800 Pure hypercholesterolemia, unspecified: Secondary | ICD-10-CM | POA: Diagnosis not present

## 2020-04-15 DIAGNOSIS — N184 Chronic kidney disease, stage 4 (severe): Secondary | ICD-10-CM | POA: Diagnosis not present

## 2020-04-15 DIAGNOSIS — D631 Anemia in chronic kidney disease: Secondary | ICD-10-CM | POA: Diagnosis not present

## 2020-04-21 ENCOUNTER — Other Ambulatory Visit: Payer: Self-pay | Admitting: Family Medicine

## 2020-04-21 DIAGNOSIS — E7800 Pure hypercholesterolemia, unspecified: Secondary | ICD-10-CM | POA: Diagnosis not present

## 2020-04-21 DIAGNOSIS — R634 Abnormal weight loss: Secondary | ICD-10-CM | POA: Diagnosis not present

## 2020-04-21 DIAGNOSIS — E1122 Type 2 diabetes mellitus with diabetic chronic kidney disease: Secondary | ICD-10-CM | POA: Diagnosis not present

## 2020-04-21 DIAGNOSIS — E1140 Type 2 diabetes mellitus with diabetic neuropathy, unspecified: Secondary | ICD-10-CM | POA: Diagnosis not present

## 2020-04-21 DIAGNOSIS — D631 Anemia in chronic kidney disease: Secondary | ICD-10-CM | POA: Diagnosis not present

## 2020-04-21 DIAGNOSIS — J302 Other seasonal allergic rhinitis: Secondary | ICD-10-CM

## 2020-04-21 DIAGNOSIS — Z9181 History of falling: Secondary | ICD-10-CM | POA: Diagnosis not present

## 2020-04-21 DIAGNOSIS — L720 Epidermal cyst: Secondary | ICD-10-CM | POA: Diagnosis not present

## 2020-04-21 DIAGNOSIS — N184 Chronic kidney disease, stage 4 (severe): Secondary | ICD-10-CM | POA: Diagnosis not present

## 2020-04-21 DIAGNOSIS — I129 Hypertensive chronic kidney disease with stage 1 through stage 4 chronic kidney disease, or unspecified chronic kidney disease: Secondary | ICD-10-CM | POA: Diagnosis not present

## 2020-04-22 DIAGNOSIS — D631 Anemia in chronic kidney disease: Secondary | ICD-10-CM | POA: Diagnosis not present

## 2020-04-22 DIAGNOSIS — Z9181 History of falling: Secondary | ICD-10-CM | POA: Diagnosis not present

## 2020-04-22 DIAGNOSIS — R634 Abnormal weight loss: Secondary | ICD-10-CM | POA: Diagnosis not present

## 2020-04-22 DIAGNOSIS — I129 Hypertensive chronic kidney disease with stage 1 through stage 4 chronic kidney disease, or unspecified chronic kidney disease: Secondary | ICD-10-CM | POA: Diagnosis not present

## 2020-04-22 DIAGNOSIS — L720 Epidermal cyst: Secondary | ICD-10-CM | POA: Diagnosis not present

## 2020-04-22 DIAGNOSIS — E1140 Type 2 diabetes mellitus with diabetic neuropathy, unspecified: Secondary | ICD-10-CM | POA: Diagnosis not present

## 2020-04-22 DIAGNOSIS — E1122 Type 2 diabetes mellitus with diabetic chronic kidney disease: Secondary | ICD-10-CM | POA: Diagnosis not present

## 2020-04-22 DIAGNOSIS — N184 Chronic kidney disease, stage 4 (severe): Secondary | ICD-10-CM | POA: Diagnosis not present

## 2020-04-22 DIAGNOSIS — E7800 Pure hypercholesterolemia, unspecified: Secondary | ICD-10-CM | POA: Diagnosis not present

## 2020-04-25 DIAGNOSIS — E7800 Pure hypercholesterolemia, unspecified: Secondary | ICD-10-CM | POA: Diagnosis not present

## 2020-04-25 DIAGNOSIS — D631 Anemia in chronic kidney disease: Secondary | ICD-10-CM | POA: Diagnosis not present

## 2020-04-25 DIAGNOSIS — L720 Epidermal cyst: Secondary | ICD-10-CM | POA: Diagnosis not present

## 2020-04-25 DIAGNOSIS — R634 Abnormal weight loss: Secondary | ICD-10-CM | POA: Diagnosis not present

## 2020-04-25 DIAGNOSIS — N184 Chronic kidney disease, stage 4 (severe): Secondary | ICD-10-CM | POA: Diagnosis not present

## 2020-04-25 DIAGNOSIS — I129 Hypertensive chronic kidney disease with stage 1 through stage 4 chronic kidney disease, or unspecified chronic kidney disease: Secondary | ICD-10-CM | POA: Diagnosis not present

## 2020-04-25 DIAGNOSIS — E1122 Type 2 diabetes mellitus with diabetic chronic kidney disease: Secondary | ICD-10-CM | POA: Diagnosis not present

## 2020-04-25 DIAGNOSIS — Z9181 History of falling: Secondary | ICD-10-CM | POA: Diagnosis not present

## 2020-04-25 DIAGNOSIS — E1140 Type 2 diabetes mellitus with diabetic neuropathy, unspecified: Secondary | ICD-10-CM | POA: Diagnosis not present

## 2020-04-26 ENCOUNTER — Encounter (HOSPITAL_COMMUNITY)
Admission: RE | Admit: 2020-04-26 | Discharge: 2020-04-26 | Disposition: A | Discharge status: Home / Self Care | Payer: BC Managed Care – PPO | Source: Ambulatory Visit | Attending: Nephrology | Admitting: Nephrology

## 2020-04-26 ENCOUNTER — Other Ambulatory Visit: Payer: Self-pay

## 2020-04-26 VITALS — BP 147/78 | HR 66 | Temp 97.4°F

## 2020-04-26 DIAGNOSIS — N1831 Chronic kidney disease, stage 3a: Secondary | ICD-10-CM

## 2020-04-26 LAB — POCT HEMOGLOBIN-HEMACUE: Hemoglobin: 10.5 g/dL — ABNORMAL LOW (ref 13.0–17.0)

## 2020-04-26 MED ORDER — EPOETIN ALFA-EPBX 10000 UNIT/ML IJ SOLN: 10000.0000 [IU] | INTRAMUSCULAR | Status: DC

## 2020-04-26 MED ORDER — EPOETIN ALFA-EPBX 10000 UNIT/ML IJ SOLN
INTRAMUSCULAR | Status: AC
  Administered 2020-04-26: 10000 [IU] via SUBCUTANEOUS
  Filled 2020-04-26: qty 1

## 2020-04-29 DIAGNOSIS — Z9181 History of falling: Secondary | ICD-10-CM | POA: Diagnosis not present

## 2020-04-29 DIAGNOSIS — N184 Chronic kidney disease, stage 4 (severe): Secondary | ICD-10-CM | POA: Diagnosis not present

## 2020-04-29 DIAGNOSIS — E7800 Pure hypercholesterolemia, unspecified: Secondary | ICD-10-CM | POA: Diagnosis not present

## 2020-04-29 DIAGNOSIS — E1140 Type 2 diabetes mellitus with diabetic neuropathy, unspecified: Secondary | ICD-10-CM | POA: Diagnosis not present

## 2020-04-29 DIAGNOSIS — R634 Abnormal weight loss: Secondary | ICD-10-CM | POA: Diagnosis not present

## 2020-04-29 DIAGNOSIS — L720 Epidermal cyst: Secondary | ICD-10-CM | POA: Diagnosis not present

## 2020-04-29 DIAGNOSIS — E1122 Type 2 diabetes mellitus with diabetic chronic kidney disease: Secondary | ICD-10-CM | POA: Diagnosis not present

## 2020-04-29 DIAGNOSIS — D631 Anemia in chronic kidney disease: Secondary | ICD-10-CM | POA: Diagnosis not present

## 2020-04-29 DIAGNOSIS — I129 Hypertensive chronic kidney disease with stage 1 through stage 4 chronic kidney disease, or unspecified chronic kidney disease: Secondary | ICD-10-CM | POA: Diagnosis not present

## 2020-05-04 DIAGNOSIS — Z9181 History of falling: Secondary | ICD-10-CM | POA: Diagnosis not present

## 2020-05-04 DIAGNOSIS — D631 Anemia in chronic kidney disease: Secondary | ICD-10-CM | POA: Diagnosis not present

## 2020-05-04 DIAGNOSIS — R634 Abnormal weight loss: Secondary | ICD-10-CM | POA: Diagnosis not present

## 2020-05-04 DIAGNOSIS — N184 Chronic kidney disease, stage 4 (severe): Secondary | ICD-10-CM | POA: Diagnosis not present

## 2020-05-04 DIAGNOSIS — E7800 Pure hypercholesterolemia, unspecified: Secondary | ICD-10-CM | POA: Diagnosis not present

## 2020-05-04 DIAGNOSIS — L720 Epidermal cyst: Secondary | ICD-10-CM | POA: Diagnosis not present

## 2020-05-04 DIAGNOSIS — E1122 Type 2 diabetes mellitus with diabetic chronic kidney disease: Secondary | ICD-10-CM | POA: Diagnosis not present

## 2020-05-04 DIAGNOSIS — E1140 Type 2 diabetes mellitus with diabetic neuropathy, unspecified: Secondary | ICD-10-CM | POA: Diagnosis not present

## 2020-05-04 DIAGNOSIS — I129 Hypertensive chronic kidney disease with stage 1 through stage 4 chronic kidney disease, or unspecified chronic kidney disease: Secondary | ICD-10-CM | POA: Diagnosis not present

## 2020-05-09 DIAGNOSIS — D631 Anemia in chronic kidney disease: Secondary | ICD-10-CM | POA: Diagnosis not present

## 2020-05-09 DIAGNOSIS — Z9181 History of falling: Secondary | ICD-10-CM | POA: Diagnosis not present

## 2020-05-09 DIAGNOSIS — E1122 Type 2 diabetes mellitus with diabetic chronic kidney disease: Secondary | ICD-10-CM | POA: Diagnosis not present

## 2020-05-09 DIAGNOSIS — I129 Hypertensive chronic kidney disease with stage 1 through stage 4 chronic kidney disease, or unspecified chronic kidney disease: Secondary | ICD-10-CM | POA: Diagnosis not present

## 2020-05-09 DIAGNOSIS — L720 Epidermal cyst: Secondary | ICD-10-CM | POA: Diagnosis not present

## 2020-05-09 DIAGNOSIS — E7800 Pure hypercholesterolemia, unspecified: Secondary | ICD-10-CM | POA: Diagnosis not present

## 2020-05-09 DIAGNOSIS — E1140 Type 2 diabetes mellitus with diabetic neuropathy, unspecified: Secondary | ICD-10-CM | POA: Diagnosis not present

## 2020-05-09 DIAGNOSIS — N184 Chronic kidney disease, stage 4 (severe): Secondary | ICD-10-CM | POA: Diagnosis not present

## 2020-05-09 DIAGNOSIS — R634 Abnormal weight loss: Secondary | ICD-10-CM | POA: Diagnosis not present

## 2020-05-10 ENCOUNTER — Other Ambulatory Visit: Payer: Self-pay

## 2020-05-10 ENCOUNTER — Encounter (HOSPITAL_COMMUNITY)
Admission: RE | Admit: 2020-05-10 | Discharge: 2020-05-10 | Disposition: A | Discharge status: Home / Self Care | Payer: BC Managed Care – PPO | Source: Ambulatory Visit | Attending: Nephrology | Admitting: Nephrology

## 2020-05-10 VITALS — BP 121/61 | HR 60 | Temp 97.1°F | Resp 20

## 2020-05-10 DIAGNOSIS — N1831 Chronic kidney disease, stage 3a: Secondary | ICD-10-CM | POA: Insufficient documentation

## 2020-05-10 LAB — IRON AND TIBC
Iron: 55 ug/dL (ref 45–182)
Saturation Ratios: 25 % (ref 17.9–39.5)
TIBC: 220 ug/dL — ABNORMAL LOW (ref 250–450)
UIBC: 165 ug/dL

## 2020-05-10 LAB — FERRITIN: Ferritin: 147 ng/mL (ref 24–336)

## 2020-05-10 LAB — POCT HEMOGLOBIN-HEMACUE: Hemoglobin: 10.8 g/dL — ABNORMAL LOW (ref 13.0–17.0)

## 2020-05-10 MED ORDER — EPOETIN ALFA-EPBX 10000 UNIT/ML IJ SOLN
INTRAMUSCULAR | Status: AC
  Administered 2020-05-10: 10000 [IU] via SUBCUTANEOUS
  Filled 2020-05-10: qty 1

## 2020-05-10 MED ORDER — EPOETIN ALFA-EPBX 10000 UNIT/ML IJ SOLN: 10000.0000 [IU] | INTRAMUSCULAR | Status: DC

## 2020-05-15 DIAGNOSIS — R634 Abnormal weight loss: Secondary | ICD-10-CM | POA: Diagnosis not present

## 2020-05-15 DIAGNOSIS — E1140 Type 2 diabetes mellitus with diabetic neuropathy, unspecified: Secondary | ICD-10-CM | POA: Diagnosis not present

## 2020-05-15 DIAGNOSIS — D631 Anemia in chronic kidney disease: Secondary | ICD-10-CM | POA: Diagnosis not present

## 2020-05-15 DIAGNOSIS — E7800 Pure hypercholesterolemia, unspecified: Secondary | ICD-10-CM | POA: Diagnosis not present

## 2020-05-15 DIAGNOSIS — L720 Epidermal cyst: Secondary | ICD-10-CM | POA: Diagnosis not present

## 2020-05-15 DIAGNOSIS — N184 Chronic kidney disease, stage 4 (severe): Secondary | ICD-10-CM | POA: Diagnosis not present

## 2020-05-15 DIAGNOSIS — E1122 Type 2 diabetes mellitus with diabetic chronic kidney disease: Secondary | ICD-10-CM | POA: Diagnosis not present

## 2020-05-15 DIAGNOSIS — I129 Hypertensive chronic kidney disease with stage 1 through stage 4 chronic kidney disease, or unspecified chronic kidney disease: Secondary | ICD-10-CM | POA: Diagnosis not present

## 2020-05-15 DIAGNOSIS — Z9181 History of falling: Secondary | ICD-10-CM | POA: Diagnosis not present

## 2020-05-16 DIAGNOSIS — E1140 Type 2 diabetes mellitus with diabetic neuropathy, unspecified: Secondary | ICD-10-CM | POA: Diagnosis not present

## 2020-05-16 DIAGNOSIS — E7800 Pure hypercholesterolemia, unspecified: Secondary | ICD-10-CM | POA: Diagnosis not present

## 2020-05-16 DIAGNOSIS — I129 Hypertensive chronic kidney disease with stage 1 through stage 4 chronic kidney disease, or unspecified chronic kidney disease: Secondary | ICD-10-CM | POA: Diagnosis not present

## 2020-05-16 DIAGNOSIS — D631 Anemia in chronic kidney disease: Secondary | ICD-10-CM | POA: Diagnosis not present

## 2020-05-16 DIAGNOSIS — L720 Epidermal cyst: Secondary | ICD-10-CM | POA: Diagnosis not present

## 2020-05-16 DIAGNOSIS — Z9181 History of falling: Secondary | ICD-10-CM | POA: Diagnosis not present

## 2020-05-16 DIAGNOSIS — R634 Abnormal weight loss: Secondary | ICD-10-CM | POA: Diagnosis not present

## 2020-05-16 DIAGNOSIS — N184 Chronic kidney disease, stage 4 (severe): Secondary | ICD-10-CM | POA: Diagnosis not present

## 2020-05-16 DIAGNOSIS — E1122 Type 2 diabetes mellitus with diabetic chronic kidney disease: Secondary | ICD-10-CM | POA: Diagnosis not present

## 2020-05-24 ENCOUNTER — Other Ambulatory Visit: Payer: Self-pay

## 2020-05-24 ENCOUNTER — Encounter (HOSPITAL_COMMUNITY)
Admission: RE | Admit: 2020-05-24 | Discharge: 2020-05-24 | Disposition: A | Discharge status: Home / Self Care | Payer: BC Managed Care – PPO | Source: Ambulatory Visit | Attending: Nephrology | Admitting: Nephrology

## 2020-05-24 VITALS — BP 139/72 | HR 59 | Temp 97.3°F | Resp 20

## 2020-05-24 DIAGNOSIS — N1831 Chronic kidney disease, stage 3a: Secondary | ICD-10-CM

## 2020-05-24 LAB — POCT HEMOGLOBIN-HEMACUE: Hemoglobin: 10.5 g/dL — ABNORMAL LOW (ref 13.0–17.0)

## 2020-05-24 MED ORDER — EPOETIN ALFA-EPBX 10000 UNIT/ML IJ SOLN
INTRAMUSCULAR | Status: AC
  Filled 2020-05-24: qty 1

## 2020-05-24 MED ORDER — EPOETIN ALFA-EPBX 10000 UNIT/ML IJ SOLN
10000.0000 [IU] | INTRAMUSCULAR | Status: DC
  Administered 2020-05-24: 10000 [IU] via SUBCUTANEOUS

## 2020-05-26 ENCOUNTER — Other Ambulatory Visit: Payer: Self-pay | Admitting: Family Medicine

## 2020-05-26 DIAGNOSIS — L720 Epidermal cyst: Secondary | ICD-10-CM | POA: Diagnosis not present

## 2020-05-26 DIAGNOSIS — N184 Chronic kidney disease, stage 4 (severe): Secondary | ICD-10-CM | POA: Diagnosis not present

## 2020-05-26 DIAGNOSIS — R634 Abnormal weight loss: Secondary | ICD-10-CM | POA: Diagnosis not present

## 2020-05-26 DIAGNOSIS — I129 Hypertensive chronic kidney disease with stage 1 through stage 4 chronic kidney disease, or unspecified chronic kidney disease: Secondary | ICD-10-CM | POA: Diagnosis not present

## 2020-05-26 DIAGNOSIS — E1140 Type 2 diabetes mellitus with diabetic neuropathy, unspecified: Secondary | ICD-10-CM | POA: Diagnosis not present

## 2020-05-26 DIAGNOSIS — E7800 Pure hypercholesterolemia, unspecified: Secondary | ICD-10-CM | POA: Diagnosis not present

## 2020-05-26 DIAGNOSIS — J302 Other seasonal allergic rhinitis: Secondary | ICD-10-CM

## 2020-05-26 DIAGNOSIS — E1122 Type 2 diabetes mellitus with diabetic chronic kidney disease: Secondary | ICD-10-CM | POA: Diagnosis not present

## 2020-05-26 DIAGNOSIS — D631 Anemia in chronic kidney disease: Secondary | ICD-10-CM | POA: Diagnosis not present

## 2020-05-26 DIAGNOSIS — Z9181 History of falling: Secondary | ICD-10-CM | POA: Diagnosis not present

## 2020-05-31 DIAGNOSIS — E1140 Type 2 diabetes mellitus with diabetic neuropathy, unspecified: Secondary | ICD-10-CM | POA: Diagnosis not present

## 2020-05-31 DIAGNOSIS — I129 Hypertensive chronic kidney disease with stage 1 through stage 4 chronic kidney disease, or unspecified chronic kidney disease: Secondary | ICD-10-CM | POA: Diagnosis not present

## 2020-05-31 DIAGNOSIS — E7800 Pure hypercholesterolemia, unspecified: Secondary | ICD-10-CM | POA: Diagnosis not present

## 2020-05-31 DIAGNOSIS — E1122 Type 2 diabetes mellitus with diabetic chronic kidney disease: Secondary | ICD-10-CM | POA: Diagnosis not present

## 2020-05-31 DIAGNOSIS — L720 Epidermal cyst: Secondary | ICD-10-CM | POA: Diagnosis not present

## 2020-05-31 DIAGNOSIS — Z9181 History of falling: Secondary | ICD-10-CM | POA: Diagnosis not present

## 2020-05-31 DIAGNOSIS — R634 Abnormal weight loss: Secondary | ICD-10-CM | POA: Diagnosis not present

## 2020-05-31 DIAGNOSIS — N184 Chronic kidney disease, stage 4 (severe): Secondary | ICD-10-CM | POA: Diagnosis not present

## 2020-05-31 DIAGNOSIS — D631 Anemia in chronic kidney disease: Secondary | ICD-10-CM | POA: Diagnosis not present

## 2020-06-07 ENCOUNTER — Other Ambulatory Visit: Payer: Self-pay

## 2020-06-07 ENCOUNTER — Encounter (HOSPITAL_COMMUNITY)
Admission: RE | Admit: 2020-06-07 | Discharge: 2020-06-07 | Disposition: A | Discharge status: Home / Self Care | Payer: BC Managed Care – PPO | Source: Ambulatory Visit | Attending: Nephrology | Admitting: Nephrology

## 2020-06-07 VITALS — BP 160/71 | HR 65 | Temp 97.6°F | Resp 20

## 2020-06-07 DIAGNOSIS — N1831 Chronic kidney disease, stage 3a: Secondary | ICD-10-CM | POA: Diagnosis present

## 2020-06-07 LAB — IRON AND TIBC
Iron: 61 ug/dL (ref 45–182)
Saturation Ratios: 25 % (ref 17.9–39.5)
TIBC: 244 ug/dL — ABNORMAL LOW (ref 250–450)
UIBC: 183 ug/dL

## 2020-06-07 LAB — FERRITIN: Ferritin: 137 ng/mL (ref 24–336)

## 2020-06-07 LAB — POCT HEMOGLOBIN-HEMACUE: Hemoglobin: 10.4 g/dL — ABNORMAL LOW (ref 13.0–17.0)

## 2020-06-07 MED ORDER — EPOETIN ALFA-EPBX 10000 UNIT/ML IJ SOLN
10000.0000 [IU] | INTRAMUSCULAR | Status: DC
  Administered 2020-06-07: 10000 [IU] via SUBCUTANEOUS

## 2020-06-07 MED ORDER — EPOETIN ALFA-EPBX 10000 UNIT/ML IJ SOLN
INTRAMUSCULAR | Status: AC
  Filled 2020-06-07: qty 1

## 2020-06-13 DIAGNOSIS — N184 Chronic kidney disease, stage 4 (severe): Secondary | ICD-10-CM | POA: Diagnosis not present

## 2020-06-13 DIAGNOSIS — Z9181 History of falling: Secondary | ICD-10-CM | POA: Diagnosis not present

## 2020-06-13 DIAGNOSIS — I129 Hypertensive chronic kidney disease with stage 1 through stage 4 chronic kidney disease, or unspecified chronic kidney disease: Secondary | ICD-10-CM | POA: Diagnosis not present

## 2020-06-13 DIAGNOSIS — L720 Epidermal cyst: Secondary | ICD-10-CM | POA: Diagnosis not present

## 2020-06-13 DIAGNOSIS — R634 Abnormal weight loss: Secondary | ICD-10-CM | POA: Diagnosis not present

## 2020-06-13 DIAGNOSIS — E1122 Type 2 diabetes mellitus with diabetic chronic kidney disease: Secondary | ICD-10-CM | POA: Diagnosis not present

## 2020-06-13 DIAGNOSIS — D631 Anemia in chronic kidney disease: Secondary | ICD-10-CM | POA: Diagnosis not present

## 2020-06-13 DIAGNOSIS — E7800 Pure hypercholesterolemia, unspecified: Secondary | ICD-10-CM | POA: Diagnosis not present

## 2020-06-13 DIAGNOSIS — E1140 Type 2 diabetes mellitus with diabetic neuropathy, unspecified: Secondary | ICD-10-CM | POA: Diagnosis not present

## 2020-06-14 DIAGNOSIS — E1140 Type 2 diabetes mellitus with diabetic neuropathy, unspecified: Secondary | ICD-10-CM | POA: Diagnosis not present

## 2020-06-14 DIAGNOSIS — L720 Epidermal cyst: Secondary | ICD-10-CM | POA: Diagnosis not present

## 2020-06-14 DIAGNOSIS — R634 Abnormal weight loss: Secondary | ICD-10-CM | POA: Diagnosis not present

## 2020-06-14 DIAGNOSIS — N184 Chronic kidney disease, stage 4 (severe): Secondary | ICD-10-CM | POA: Diagnosis not present

## 2020-06-14 DIAGNOSIS — E7800 Pure hypercholesterolemia, unspecified: Secondary | ICD-10-CM | POA: Diagnosis not present

## 2020-06-14 DIAGNOSIS — D631 Anemia in chronic kidney disease: Secondary | ICD-10-CM | POA: Diagnosis not present

## 2020-06-14 DIAGNOSIS — I130 Hypertensive heart and chronic kidney disease with heart failure and stage 1 through stage 4 chronic kidney disease, or unspecified chronic kidney disease: Secondary | ICD-10-CM | POA: Diagnosis not present

## 2020-06-14 DIAGNOSIS — E1122 Type 2 diabetes mellitus with diabetic chronic kidney disease: Secondary | ICD-10-CM | POA: Diagnosis not present

## 2020-06-14 DIAGNOSIS — I509 Heart failure, unspecified: Secondary | ICD-10-CM | POA: Diagnosis not present

## 2020-06-20 ENCOUNTER — Ambulatory Visit: Payer: BC Managed Care – PPO | Admitting: Cardiology

## 2020-06-20 NOTE — Progress Notes (Deleted)
Patient is here for follow up visit.  Subjective:   [facility], male    DOB: 06-29-1951, 69 y.o.   MRN: [id number]  *** No chief complaint on file.  HPI  69 y.o. African American male with uncontrolled hypertension, type 2 DM, hyperlipidemia, advanced CKD stage, persistent Afib, now in sinus rhythm s/p cardioversion  Blood pressure improved, but remains suboptimal. He has regular follow up with nephrology. Toe blisters have healed. He does not have lifestyle limiting claudication.  Current Outpatient Medications on File Prior to Visit  Medication Sig Dispense Refill  . acetaminophen (TYLENOL) 650 MG CR tablet Take 1,300 mg by mouth every 8 (eight) hours as needed for pain.    Marland Kitchen amLODipine (NORVASC) 10 MG tablet Take 1 tablet (10 mg total) by mouth daily. 90 tablet 3  . Blood Glucose Monitoring Suppl (ACCU-CHEK GUIDE ME) w/Device KIT 1 Device by Does not apply route 4 (four) times daily -  before meals and at bedtime. 1 kit 0  . Camphor-Eucalyptus-Menthol (VICKS VAPORUB EX) Apply 1 application topically daily as needed (congestion).    . carvedilol (COREG) 25 MG tablet Take 1 tablet (25 mg total) by mouth 2 (two) times daily. 180 tablet 1  . cholecalciferol (VITAMIN D) 25 MCG (1000 UT) tablet Take 1,000 Units by mouth daily.     . Continuous Blood Gluc Sensor (FREESTYLE LIBRE 14 DAY SENSOR) MISC 1 Device by Does not apply route every 14 (fourteen) days. 6 each 3  . Cyanocobalamin 2500 MCG CHEW Chew 2,500 mcg by mouth daily.    Marland Kitchen ELIQUIS 5 MG TABS tablet TAKE 1 TABLET BY MOUTH TWICE A DAY (Patient taking differently: Take 5 mg by mouth 2 (two) times daily. ) 60 tablet 6  . feeding supplement, GLUCERNA SHAKE, (GLUCERNA SHAKE) LIQD Take 237 mLs by mouth 3 (three) times daily between meals. (Patient taking differently: Take 237 mLs by mouth daily. )  0  . furosemide (LASIX) 40 MG tablet Take 40 mg by mouth every other day.    . furosemide (LASIX) 80 MG tablet Take 80 mg by  mouth every other day.    . gabapentin (NEURONTIN) 300 MG capsule Take 300 mg by mouth 2 (two) times daily.     Marland Kitchen HYDROcodone-acetaminophen (NORCO/VICODIN) 5-325 MG tablet Take 1 tablet by mouth every 6 (six) hours as needed for moderate pain. 8 tablet 0  . insulin aspart (NOVOLOG FLEXPEN) 100 UNIT/ML FlexPen Inject 3 Units into the skin 3 (three) times daily with meals. (Patient taking differently: Inject 1-3 Units into the skin 3 (three) times daily as needed for high blood sugar. Monitor blood sugar free style libre range is on the meter) 15 mL 11  . isosorbide-hydrALAZINE (BIDIL) 20-37.5 MG tablet Take 1 tablet by mouth 3 (three) times daily. 90 tablet 3  . linaclotide (LINZESS) 145 MCG CAPS capsule Take 1 capsule (145 mcg total) by mouth daily as needed (constipation). 30 capsule 0  . methocarbamol (ROBAXIN) 500 MG tablet TAKE 1 TABLET BY MOUTH TWICE DAILY AS NEEDED FOR MUSCLE SPASM (Patient taking differently: Take 500 mg by mouth 2 (two) times daily as needed for muscle spasms. ) 120 tablet 1  . montelukast (SINGULAIR) 10 MG tablet Take 1 tablet (10 mg total) by mouth at bedtime. 90 tablet 3  . omeprazole (PRILOSEC) 20 MG capsule Take 20 mg by mouth daily.     . rosuvastatin (CRESTOR) 20 MG tablet TAKE 1 TABLET (20 MG TOTAL) BY MOUTH DAILY  AT 6 PM. 90 tablet 1  . tamsulosin (FLOMAX) 0.4 MG CAPS capsule Take 0.4 mg by mouth at bedtime.    . vitamin E (VITAMIN E) 1000 UNIT capsule Take 1,000 Units by mouth daily.      No current facility-administered medications on file prior to visit.    Cardiovascular studies:  Lower Extremity Arterial Duplex 01/18/2020:  No hemodynamically significant stenosis noted in the bilateral common and  superficial femoral arteries.  Bilateral anterior and post tibial spectral waveform demonstrates a  monophasic flow pattern suggestive of severe diffuse small vessel disease.  This exam reveals normal perfusion of the right and left lower extremity  (ABI 0.97).    The ABI may be falsely elevated in a patient with arteriosclerosis.  Consider further work up for CLI if clinically indicated.  EKG 01/08/2020: Sinus rhythm 66 bpm. Left ventricular hypertrophy. ST-T changes related to LVH.  Coronary angiogram 10/20/2018: LM: Normal LAD: Distal 70% diffuse disease.        Ostial 90% stenosis in Diag1. Patent prox Diag 1 stent LCx: Large OM1 with patent prior stent RCA: 100% occluded RPDA        Partially successful PTCA attempt        100%--99% stenosis        TIMI flow 0-I  LVEDP 16 mmHg  Echocardiogram 10/17/2018: Left ventricle: The cavity size was normal. Wall thickness was increased in a pattern of severe LVH. Systolic function was normal. The estimated ejection fraction was in the range of 50% to 55%. Wall motion was normal; there were no regional wall motion abnormalities. Doppler parameters are consistent with anormal left ventricular relaxation (grade 1 diastolic dysfunction). Doppler parameters are consistent with high ventricular filling pressure. - Left atrium: The atrium was moderately dilated. - Right atrium: The atrium was mildly dilated.  Impressions: - Low normal to mildly reduced LV systolic function; EF 50; severe LVH; mild diastolic dysfunction; biatrial enlargement.  Recent Labs: 03/01/2020: Hemoglobin 11  Iron 60, TIBC 231.  01/06/2020: Glucose 173, BUN/Cr 43/2.9.  Na/K 137/4.8. Rest of the CMP normal  12/11/2019: Glucose 151. BUN/Cr 28/2.6. HbA1C 7.7%.   11//2020: Glucose 177, BUN/Cr 27/2.72. EGFR 23. Na/K 140/4.5. Albumin 2.4 low. Rest of the CMP normal H/H 8.9/27.8. MCV 86. Platelets 177 HbA1C 8.0%   02/2018: Chol 162, TG 95, HDL 37, LDL 105  Review of Systems  Constitutional: Negative for malaise/fatigue.  Cardiovascular: Positive for leg swelling (Improved). Negative for chest pain, dyspnea on exertion, palpitations and syncope.       LE ulcer   Respiratory: Negative for shortness of breath.     Genitourinary:       Erectile dysfunction  All other systems reviewed and are negative.      Objective:   *** There were no vitals filed for this visit.   Physical Exam Vitals and nursing note reviewed.  Constitutional:      General: He is not in acute distress. Neck:     Vascular: No JVD.  Cardiovascular:     Rate and Rhythm: Normal rate. Rhythm irregularly irregular.     Pulses: Decreased pulses.          Femoral pulses are 2+ on the right side and 2+ on the left side.      Popliteal pulses are 2+ on the right side and 1+ on the left side.       Dorsalis pedis pulses are 0 on the left side.       Posterior tibial  pulses are 0 on the right side and 0 on the left side.     Heart sounds: Murmur (Flow murmur likely due to LUE AV fistula) heard.      Comments: LUE AV fistula  Superficial ulcer LLE 2nd toe Pulmonary:     Effort: Pulmonary effort is normal.     Breath sounds: Normal breath sounds. No wheezing or rales.         Assessment & Recommendations:   69 y.o. African American male with uncontrolled hypertension, type 2 DM, hyperlipidemia, advanced CKD stage, persistent Afib, now in sinus rhythm s/p cardioversion, severe PAD.   PAD: Toe ulcer healed well.  He likely has severe PAD< especially below the knee. His ulcer has healed well, he does not have any claudication. He has advanced CKD, but not on dialysis yet. CO2 angiogram will likely be inadequate to evaluate and intervene on below the knee disease. In order to preserve his renal function, I would hold off contrast angiogram unless he develps critical limb ischemia (nonhealing ulcer, resting pain, gangrene). I counseled patient to check for any injuries on his feet on a daily basis. If any new wounds appears, he should contact us immediately.  Continue current medical therapy, including, statin Not on statin due to ongoing use of eliquis and prior h/o GI bleeding.   Atrial fibrillation: Persistent, now in  sinus rhythm s/p cardioversion 09/29/2019. CHA2DS2VASc score 4, annual stroke risk 5%. Continue eliquis 5 mg bid.   CAD: CAD s/p prior PCI, NSTEMI in 09/2018, attempted but unsuccessful. PTCA to chronically occluded Rt PDA Stable without angina symptoms. Given his advanced CKD, I do not recommend further revascualrization attempts.  Hypertension: Switch hydralazine to Bidil 20-37.5 mg tid, since he is not using cialis anymore.  Mild bilateral carotid stenosis: Continue medical management  F/u in *** months  Marquez Ceesay [place], MD [facility] Pager: [phone] Office: [phone] If no answer Cell [phone]

## 2020-06-21 ENCOUNTER — Inpatient Hospital Stay (HOSPITAL_COMMUNITY): Admission: RE | Admit: 2020-06-21 | Payer: BC Managed Care – PPO | Source: Ambulatory Visit

## 2020-06-21 DIAGNOSIS — L720 Epidermal cyst: Secondary | ICD-10-CM | POA: Diagnosis not present

## 2020-06-21 DIAGNOSIS — D631 Anemia in chronic kidney disease: Secondary | ICD-10-CM | POA: Diagnosis not present

## 2020-06-21 DIAGNOSIS — E1122 Type 2 diabetes mellitus with diabetic chronic kidney disease: Secondary | ICD-10-CM | POA: Diagnosis not present

## 2020-06-21 DIAGNOSIS — I130 Hypertensive heart and chronic kidney disease with heart failure and stage 1 through stage 4 chronic kidney disease, or unspecified chronic kidney disease: Secondary | ICD-10-CM | POA: Diagnosis not present

## 2020-06-21 DIAGNOSIS — N184 Chronic kidney disease, stage 4 (severe): Secondary | ICD-10-CM | POA: Diagnosis not present

## 2020-06-21 DIAGNOSIS — R634 Abnormal weight loss: Secondary | ICD-10-CM | POA: Diagnosis not present

## 2020-06-21 DIAGNOSIS — E7800 Pure hypercholesterolemia, unspecified: Secondary | ICD-10-CM | POA: Diagnosis not present

## 2020-06-21 DIAGNOSIS — E1140 Type 2 diabetes mellitus with diabetic neuropathy, unspecified: Secondary | ICD-10-CM | POA: Diagnosis not present

## 2020-06-21 DIAGNOSIS — I509 Heart failure, unspecified: Secondary | ICD-10-CM | POA: Diagnosis not present

## 2020-06-28 DIAGNOSIS — E1140 Type 2 diabetes mellitus with diabetic neuropathy, unspecified: Secondary | ICD-10-CM | POA: Diagnosis not present

## 2020-06-28 DIAGNOSIS — N184 Chronic kidney disease, stage 4 (severe): Secondary | ICD-10-CM | POA: Diagnosis not present

## 2020-06-28 DIAGNOSIS — R634 Abnormal weight loss: Secondary | ICD-10-CM | POA: Diagnosis not present

## 2020-06-28 DIAGNOSIS — D631 Anemia in chronic kidney disease: Secondary | ICD-10-CM | POA: Diagnosis not present

## 2020-06-28 DIAGNOSIS — I509 Heart failure, unspecified: Secondary | ICD-10-CM | POA: Diagnosis not present

## 2020-06-28 DIAGNOSIS — L720 Epidermal cyst: Secondary | ICD-10-CM | POA: Diagnosis not present

## 2020-06-28 DIAGNOSIS — I130 Hypertensive heart and chronic kidney disease with heart failure and stage 1 through stage 4 chronic kidney disease, or unspecified chronic kidney disease: Secondary | ICD-10-CM | POA: Diagnosis not present

## 2020-06-28 DIAGNOSIS — E7800 Pure hypercholesterolemia, unspecified: Secondary | ICD-10-CM | POA: Diagnosis not present

## 2020-06-28 DIAGNOSIS — E1122 Type 2 diabetes mellitus with diabetic chronic kidney disease: Secondary | ICD-10-CM | POA: Diagnosis not present

## 2020-07-01 ENCOUNTER — Other Ambulatory Visit: Payer: Self-pay | Admitting: Cardiology

## 2020-07-01 DIAGNOSIS — I4819 Other persistent atrial fibrillation: Secondary | ICD-10-CM

## 2020-07-05 ENCOUNTER — Encounter (HOSPITAL_COMMUNITY): Payer: BC Managed Care – PPO

## 2020-07-05 DIAGNOSIS — R634 Abnormal weight loss: Secondary | ICD-10-CM | POA: Diagnosis not present

## 2020-07-05 DIAGNOSIS — L720 Epidermal cyst: Secondary | ICD-10-CM | POA: Diagnosis not present

## 2020-07-05 DIAGNOSIS — I130 Hypertensive heart and chronic kidney disease with heart failure and stage 1 through stage 4 chronic kidney disease, or unspecified chronic kidney disease: Secondary | ICD-10-CM | POA: Diagnosis not present

## 2020-07-05 DIAGNOSIS — E1140 Type 2 diabetes mellitus with diabetic neuropathy, unspecified: Secondary | ICD-10-CM | POA: Diagnosis not present

## 2020-07-05 DIAGNOSIS — E1122 Type 2 diabetes mellitus with diabetic chronic kidney disease: Secondary | ICD-10-CM | POA: Diagnosis not present

## 2020-07-05 DIAGNOSIS — N184 Chronic kidney disease, stage 4 (severe): Secondary | ICD-10-CM | POA: Diagnosis not present

## 2020-07-05 DIAGNOSIS — I509 Heart failure, unspecified: Secondary | ICD-10-CM | POA: Diagnosis not present

## 2020-07-05 DIAGNOSIS — E7800 Pure hypercholesterolemia, unspecified: Secondary | ICD-10-CM | POA: Diagnosis not present

## 2020-07-05 DIAGNOSIS — D631 Anemia in chronic kidney disease: Secondary | ICD-10-CM | POA: Diagnosis not present

## 2020-07-07 NOTE — Progress Notes (Signed)
No show

## 2020-07-08 ENCOUNTER — Ambulatory Visit: Payer: BC Managed Care – PPO | Admitting: Cardiology

## 2020-07-11 DIAGNOSIS — E1140 Type 2 diabetes mellitus with diabetic neuropathy, unspecified: Secondary | ICD-10-CM | POA: Diagnosis not present

## 2020-07-11 DIAGNOSIS — N184 Chronic kidney disease, stage 4 (severe): Secondary | ICD-10-CM | POA: Diagnosis not present

## 2020-07-11 DIAGNOSIS — L720 Epidermal cyst: Secondary | ICD-10-CM | POA: Diagnosis not present

## 2020-07-11 DIAGNOSIS — R634 Abnormal weight loss: Secondary | ICD-10-CM | POA: Diagnosis not present

## 2020-07-11 DIAGNOSIS — E7800 Pure hypercholesterolemia, unspecified: Secondary | ICD-10-CM | POA: Diagnosis not present

## 2020-07-11 DIAGNOSIS — D631 Anemia in chronic kidney disease: Secondary | ICD-10-CM | POA: Diagnosis not present

## 2020-07-11 DIAGNOSIS — I509 Heart failure, unspecified: Secondary | ICD-10-CM | POA: Diagnosis not present

## 2020-07-11 DIAGNOSIS — E1122 Type 2 diabetes mellitus with diabetic chronic kidney disease: Secondary | ICD-10-CM | POA: Diagnosis not present

## 2020-07-11 DIAGNOSIS — I130 Hypertensive heart and chronic kidney disease with heart failure and stage 1 through stage 4 chronic kidney disease, or unspecified chronic kidney disease: Secondary | ICD-10-CM | POA: Diagnosis not present

## 2020-07-14 DIAGNOSIS — E1122 Type 2 diabetes mellitus with diabetic chronic kidney disease: Secondary | ICD-10-CM | POA: Diagnosis not present

## 2020-07-14 DIAGNOSIS — I509 Heart failure, unspecified: Secondary | ICD-10-CM | POA: Diagnosis not present

## 2020-07-14 DIAGNOSIS — D631 Anemia in chronic kidney disease: Secondary | ICD-10-CM | POA: Diagnosis not present

## 2020-07-14 DIAGNOSIS — E7800 Pure hypercholesterolemia, unspecified: Secondary | ICD-10-CM | POA: Diagnosis not present

## 2020-07-14 DIAGNOSIS — E1140 Type 2 diabetes mellitus with diabetic neuropathy, unspecified: Secondary | ICD-10-CM | POA: Diagnosis not present

## 2020-07-14 DIAGNOSIS — R634 Abnormal weight loss: Secondary | ICD-10-CM | POA: Diagnosis not present

## 2020-07-14 DIAGNOSIS — N184 Chronic kidney disease, stage 4 (severe): Secondary | ICD-10-CM | POA: Diagnosis not present

## 2020-07-14 DIAGNOSIS — L720 Epidermal cyst: Secondary | ICD-10-CM | POA: Diagnosis not present

## 2020-07-14 DIAGNOSIS — I130 Hypertensive heart and chronic kidney disease with heart failure and stage 1 through stage 4 chronic kidney disease, or unspecified chronic kidney disease: Secondary | ICD-10-CM | POA: Diagnosis not present

## 2020-07-19 DIAGNOSIS — R634 Abnormal weight loss: Secondary | ICD-10-CM | POA: Diagnosis not present

## 2020-07-19 DIAGNOSIS — L720 Epidermal cyst: Secondary | ICD-10-CM | POA: Diagnosis not present

## 2020-07-19 DIAGNOSIS — E1122 Type 2 diabetes mellitus with diabetic chronic kidney disease: Secondary | ICD-10-CM | POA: Diagnosis not present

## 2020-07-19 DIAGNOSIS — D631 Anemia in chronic kidney disease: Secondary | ICD-10-CM | POA: Diagnosis not present

## 2020-07-19 DIAGNOSIS — E1140 Type 2 diabetes mellitus with diabetic neuropathy, unspecified: Secondary | ICD-10-CM | POA: Diagnosis not present

## 2020-07-19 DIAGNOSIS — E7800 Pure hypercholesterolemia, unspecified: Secondary | ICD-10-CM | POA: Diagnosis not present

## 2020-07-19 DIAGNOSIS — I130 Hypertensive heart and chronic kidney disease with heart failure and stage 1 through stage 4 chronic kidney disease, or unspecified chronic kidney disease: Secondary | ICD-10-CM | POA: Diagnosis not present

## 2020-07-19 DIAGNOSIS — N184 Chronic kidney disease, stage 4 (severe): Secondary | ICD-10-CM | POA: Diagnosis not present

## 2020-07-19 DIAGNOSIS — I509 Heart failure, unspecified: Secondary | ICD-10-CM | POA: Diagnosis not present

## 2020-07-19 NOTE — Progress Notes (Signed)
No show

## 2020-07-20 ENCOUNTER — Ambulatory Visit: Payer: BC Managed Care – PPO | Admitting: Cardiology

## 2020-07-25 DIAGNOSIS — D631 Anemia in chronic kidney disease: Secondary | ICD-10-CM | POA: Diagnosis not present

## 2020-07-25 DIAGNOSIS — E1140 Type 2 diabetes mellitus with diabetic neuropathy, unspecified: Secondary | ICD-10-CM | POA: Diagnosis not present

## 2020-07-25 DIAGNOSIS — L720 Epidermal cyst: Secondary | ICD-10-CM | POA: Diagnosis not present

## 2020-07-25 DIAGNOSIS — I130 Hypertensive heart and chronic kidney disease with heart failure and stage 1 through stage 4 chronic kidney disease, or unspecified chronic kidney disease: Secondary | ICD-10-CM | POA: Diagnosis not present

## 2020-07-25 DIAGNOSIS — E7800 Pure hypercholesterolemia, unspecified: Secondary | ICD-10-CM | POA: Diagnosis not present

## 2020-07-25 DIAGNOSIS — N184 Chronic kidney disease, stage 4 (severe): Secondary | ICD-10-CM | POA: Diagnosis not present

## 2020-07-25 DIAGNOSIS — E1122 Type 2 diabetes mellitus with diabetic chronic kidney disease: Secondary | ICD-10-CM | POA: Diagnosis not present

## 2020-07-25 DIAGNOSIS — I509 Heart failure, unspecified: Secondary | ICD-10-CM | POA: Diagnosis not present

## 2020-07-25 DIAGNOSIS — R634 Abnormal weight loss: Secondary | ICD-10-CM | POA: Diagnosis not present

## 2020-07-31 ENCOUNTER — Emergency Department (HOSPITAL_COMMUNITY): Payer: BC Managed Care – PPO

## 2020-07-31 ENCOUNTER — Encounter (HOSPITAL_COMMUNITY): Payer: Self-pay

## 2020-07-31 ENCOUNTER — Inpatient Hospital Stay (HOSPITAL_COMMUNITY)
Admission: EM | Admit: 2020-07-31 | Discharge: 2020-08-02 | DRG: 683 | Disposition: A | Discharge status: Home / Self Care | Payer: BC Managed Care – PPO | Attending: Internal Medicine | Admitting: Internal Medicine

## 2020-07-31 DIAGNOSIS — E1129 Type 2 diabetes mellitus with other diabetic kidney complication: Secondary | ICD-10-CM | POA: Diagnosis present

## 2020-07-31 DIAGNOSIS — I130 Hypertensive heart and chronic kidney disease with heart failure and stage 1 through stage 4 chronic kidney disease, or unspecified chronic kidney disease: Secondary | ICD-10-CM | POA: Diagnosis present

## 2020-07-31 DIAGNOSIS — I252 Old myocardial infarction: Secondary | ICD-10-CM

## 2020-07-31 DIAGNOSIS — I4819 Other persistent atrial fibrillation: Secondary | ICD-10-CM | POA: Diagnosis present

## 2020-07-31 DIAGNOSIS — N189 Chronic kidney disease, unspecified: Secondary | ICD-10-CM | POA: Diagnosis present

## 2020-07-31 DIAGNOSIS — D638 Anemia in other chronic diseases classified elsewhere: Secondary | ICD-10-CM | POA: Diagnosis present

## 2020-07-31 DIAGNOSIS — R112 Nausea with vomiting, unspecified: Secondary | ICD-10-CM | POA: Diagnosis present

## 2020-07-31 DIAGNOSIS — K219 Gastro-esophageal reflux disease without esophagitis: Secondary | ICD-10-CM | POA: Diagnosis present

## 2020-07-31 DIAGNOSIS — N400 Enlarged prostate without lower urinary tract symptoms: Secondary | ICD-10-CM | POA: Diagnosis present

## 2020-07-31 DIAGNOSIS — Z7901 Long term (current) use of anticoagulants: Secondary | ICD-10-CM

## 2020-07-31 DIAGNOSIS — E1136 Type 2 diabetes mellitus with diabetic cataract: Secondary | ICD-10-CM | POA: Diagnosis present

## 2020-07-31 DIAGNOSIS — N179 Acute kidney failure, unspecified: Secondary | ICD-10-CM | POA: Diagnosis not present

## 2020-07-31 DIAGNOSIS — I5032 Chronic diastolic (congestive) heart failure: Secondary | ICD-10-CM | POA: Diagnosis present

## 2020-07-31 DIAGNOSIS — I480 Paroxysmal atrial fibrillation: Secondary | ICD-10-CM | POA: Diagnosis present

## 2020-07-31 DIAGNOSIS — Z96651 Presence of right artificial knee joint: Secondary | ICD-10-CM | POA: Diagnosis present

## 2020-07-31 DIAGNOSIS — R1310 Dysphagia, unspecified: Secondary | ICD-10-CM | POA: Diagnosis present

## 2020-07-31 DIAGNOSIS — Z79899 Other long term (current) drug therapy: Secondary | ICD-10-CM

## 2020-07-31 DIAGNOSIS — M797 Fibromyalgia: Secondary | ICD-10-CM | POA: Diagnosis present

## 2020-07-31 DIAGNOSIS — D631 Anemia in chronic kidney disease: Secondary | ICD-10-CM | POA: Diagnosis present

## 2020-07-31 DIAGNOSIS — Z87891 Personal history of nicotine dependence: Secondary | ICD-10-CM

## 2020-07-31 DIAGNOSIS — E861 Hypovolemia: Secondary | ICD-10-CM | POA: Diagnosis present

## 2020-07-31 DIAGNOSIS — I152 Hypertension secondary to endocrine disorders: Secondary | ICD-10-CM | POA: Diagnosis present

## 2020-07-31 DIAGNOSIS — E785 Hyperlipidemia, unspecified: Secondary | ICD-10-CM | POA: Diagnosis present

## 2020-07-31 DIAGNOSIS — E1142 Type 2 diabetes mellitus with diabetic polyneuropathy: Secondary | ICD-10-CM | POA: Diagnosis present

## 2020-07-31 DIAGNOSIS — E1169 Type 2 diabetes mellitus with other specified complication: Secondary | ICD-10-CM | POA: Diagnosis present

## 2020-07-31 DIAGNOSIS — D649 Anemia, unspecified: Secondary | ICD-10-CM | POA: Diagnosis present

## 2020-07-31 DIAGNOSIS — E1122 Type 2 diabetes mellitus with diabetic chronic kidney disease: Secondary | ICD-10-CM | POA: Diagnosis present

## 2020-07-31 DIAGNOSIS — I2510 Atherosclerotic heart disease of native coronary artery without angina pectoris: Secondary | ICD-10-CM | POA: Diagnosis present

## 2020-07-31 DIAGNOSIS — N184 Chronic kidney disease, stage 4 (severe): Secondary | ICD-10-CM | POA: Diagnosis present

## 2020-07-31 DIAGNOSIS — Z794 Long term (current) use of insulin: Secondary | ICD-10-CM

## 2020-07-31 DIAGNOSIS — E860 Dehydration: Secondary | ICD-10-CM | POA: Diagnosis present

## 2020-07-31 DIAGNOSIS — Z20822 Contact with and (suspected) exposure to covid-19: Secondary | ICD-10-CM | POA: Diagnosis present

## 2020-07-31 HISTORY — DX: Nausea with vomiting, unspecified: R11.2

## 2020-07-31 LAB — COMPREHENSIVE METABOLIC PANEL
ALT: 8 U/L (ref 0–44)
AST: 12 U/L — ABNORMAL LOW (ref 15–41)
Albumin: 2.3 g/dL — ABNORMAL LOW (ref 3.5–5.0)
Alkaline Phosphatase: 99 U/L (ref 38–126)
Anion gap: 12 (ref 5–15)
BUN: 45 mg/dL — ABNORMAL HIGH (ref 8–23)
CO2: 22 mmol/L (ref 22–32)
Calcium: 8 mg/dL — ABNORMAL LOW (ref 8.9–10.3)
Chloride: 101 mmol/L (ref 98–111)
Creatinine, Ser: 4.75 mg/dL — ABNORMAL HIGH (ref 0.61–1.24)
GFR calc Af Amer: 13 mL/min — ABNORMAL LOW (ref >60)
GFR calc non Af Amer: 12 mL/min — ABNORMAL LOW (ref >60)
Glucose, Bld: 164 mg/dL — ABNORMAL HIGH (ref 70–99)
Potassium: 4 mmol/L (ref 3.5–5.1)
Sodium: 135 mmol/L (ref 135–145)
Total Bilirubin: 0.8 mg/dL (ref 0.3–1.2)
Total Protein: 6.1 g/dL — ABNORMAL LOW (ref 6.5–8.1)

## 2020-07-31 LAB — CBC
HCT: 28.8 % — ABNORMAL LOW (ref 39.0–52.0)
Hemoglobin: 9.6 g/dL — ABNORMAL LOW (ref 13.0–17.0)
MCH: 28.8 pg (ref 26.0–34.0)
MCHC: 33.3 g/dL (ref 30.0–36.0)
MCV: 86.5 fL (ref 80.0–100.0)
Platelets: 206 10*3/uL (ref 150–400)
RBC: 3.33 MIL/uL — ABNORMAL LOW (ref 4.22–5.81)
RDW: 13.7 % (ref 11.5–15.5)
WBC: 3.9 10*3/uL — ABNORMAL LOW (ref 4.0–10.5)
nRBC: 0 % (ref 0.0–0.2)

## 2020-07-31 LAB — RESPIRATORY PANEL BY RT PCR (FLU A&B, COVID)
Influenza A by PCR: NEGATIVE
Influenza B by PCR: NEGATIVE
SARS Coronavirus 2 by RT PCR: NEGATIVE

## 2020-07-31 LAB — TROPONIN I (HIGH SENSITIVITY): Troponin I (High Sensitivity): 64 ng/L — ABNORMAL HIGH (ref <18)

## 2020-07-31 LAB — BRAIN NATRIURETIC PEPTIDE: B Natriuretic Peptide: 2422.3 pg/mL — ABNORMAL HIGH (ref 0.0–100.0)

## 2020-07-31 LAB — LIPASE, BLOOD: Lipase: 20 U/L (ref 11–51)

## 2020-07-31 MED ORDER — CARVEDILOL 12.5 MG PO TABS
25.0000 mg | ORAL_TABLET | Freq: Once | ORAL | Status: AC
  Administered 2020-07-31: 25 mg via ORAL
  Filled 2020-07-31: qty 2

## 2020-07-31 MED ORDER — MONTELUKAST SODIUM 10 MG PO TABS
10.0000 mg | ORAL_TABLET | Freq: Every day | ORAL | Status: DC
  Administered 2020-08-01: 10 mg via ORAL
  Filled 2020-07-31 (×3): qty 1

## 2020-07-31 MED ORDER — ISOSORBIDE MONONITRATE ER 30 MG PO TB24
30.0000 mg | ORAL_TABLET | Freq: Every day | ORAL | Status: DC
  Administered 2020-08-01 – 2020-08-02 (×2): 30 mg via ORAL
  Filled 2020-07-31 (×2): qty 1

## 2020-07-31 MED ORDER — HYDRALAZINE HCL 25 MG PO TABS
12.5000 mg | ORAL_TABLET | Freq: Three times a day (TID) | ORAL | Status: DC
  Administered 2020-08-01 – 2020-08-02 (×5): 12.5 mg via ORAL
  Filled 2020-07-31 (×5): qty 1

## 2020-07-31 MED ORDER — HYDRALAZINE HCL 25 MG PO TABS: 12.5000 mg | ORAL_TABLET | Freq: Three times a day (TID) | ORAL | Status: DC

## 2020-07-31 MED ORDER — SODIUM CHLORIDE 0.9 % IV SOLN: INTRAVENOUS | Status: AC

## 2020-07-31 MED ORDER — APIXABAN 5 MG PO TABS
5.0000 mg | ORAL_TABLET | Freq: Two times a day (BID) | ORAL | Status: DC
  Administered 2020-08-01 – 2020-08-02 (×3): 5 mg via ORAL
  Filled 2020-07-31 (×4): qty 1

## 2020-07-31 MED ORDER — AMLODIPINE BESYLATE 10 MG PO TABS
10.0000 mg | ORAL_TABLET | Freq: Every day | ORAL | Status: DC
  Administered 2020-08-01 – 2020-08-02 (×2): 10 mg via ORAL
  Filled 2020-07-31 (×2): qty 1

## 2020-07-31 MED ORDER — CARVEDILOL 12.5 MG PO TABS: 25.0000 mg | ORAL_TABLET | Freq: Two times a day (BID) | ORAL | Status: DC

## 2020-07-31 MED ORDER — ONDANSETRON HCL 4 MG/2ML IJ SOLN: 4.0000 mg | Freq: Four times a day (QID) | INTRAMUSCULAR | Status: DC | PRN

## 2020-07-31 MED ORDER — ACETAMINOPHEN 325 MG PO TABS: 650.0000 mg | ORAL_TABLET | Freq: Four times a day (QID) | ORAL | Status: DC | PRN

## 2020-07-31 MED ORDER — LACTATED RINGERS IV BOLUS
1000.0000 mL | Freq: Once | INTRAVENOUS | Status: AC
  Administered 2020-07-31: 1000 mL via INTRAVENOUS

## 2020-07-31 MED ORDER — CARVEDILOL 25 MG PO TABS
25.0000 mg | ORAL_TABLET | Freq: Two times a day (BID) | ORAL | Status: DC
  Administered 2020-08-01 – 2020-08-02 (×3): 25 mg via ORAL
  Filled 2020-07-31 (×3): qty 1

## 2020-07-31 MED ORDER — ONDANSETRON HCL 4 MG PO TABS: 4.0000 mg | ORAL_TABLET | Freq: Four times a day (QID) | ORAL | Status: DC | PRN

## 2020-07-31 MED ORDER — HYDRALAZINE HCL 25 MG PO TABS
12.5000 mg | ORAL_TABLET | Freq: Once | ORAL | Status: AC
  Administered 2020-07-31: 12.5 mg via ORAL
  Filled 2020-07-31: qty 1

## 2020-07-31 MED ORDER — ROSUVASTATIN CALCIUM 20 MG PO TABS
20.0000 mg | ORAL_TABLET | Freq: Every day | ORAL | Status: DC
  Administered 2020-08-01: 20 mg via ORAL
  Filled 2020-07-31: qty 1

## 2020-07-31 MED ORDER — INSULIN ASPART 100 UNIT/ML ~~LOC~~ SOLN
0.0000 [IU] | Freq: Three times a day (TID) | SUBCUTANEOUS | Status: DC
  Administered 2020-08-01 – 2020-08-02 (×4): 1 [IU] via SUBCUTANEOUS

## 2020-07-31 MED ORDER — ACETAMINOPHEN 650 MG RE SUPP: 650.0000 mg | Freq: Four times a day (QID) | RECTAL | Status: DC | PRN

## 2020-07-31 MED ORDER — ONDANSETRON 4 MG PO TBDP
4.0000 mg | ORAL_TABLET | Freq: Once | ORAL | Status: AC
  Administered 2020-07-31: 4 mg via ORAL
  Filled 2020-07-31: qty 1

## 2020-07-31 MED ORDER — TAMSULOSIN HCL 0.4 MG PO CAPS
0.4000 mg | ORAL_CAPSULE | Freq: Every day | ORAL | Status: DC
  Administered 2020-08-01: 0.4 mg via ORAL
  Filled 2020-07-31 (×2): qty 1

## 2020-07-31 MED ORDER — GABAPENTIN 600 MG PO TABS
300.0000 mg | ORAL_TABLET | Freq: Three times a day (TID) | ORAL | Status: DC
  Administered 2020-08-01: 300 mg via ORAL
  Filled 2020-07-31: qty 0.5
  Filled 2020-07-31 (×2): qty 1

## 2020-07-31 MED ORDER — SODIUM CHLORIDE 0.9% FLUSH
3.0000 mL | Freq: Two times a day (BID) | INTRAVENOUS | Status: DC
  Administered 2020-07-31 – 2020-08-02 (×4): 3 mL via INTRAVENOUS

## 2020-07-31 NOTE — ED Triage Notes (Signed)
Patient complains of 4 days of unable to swallow any solids, liquids passing with no difficulty. States that he thinks he needs esophagus stretched again. Alert and oriented, NAD
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
## 2020-07-31 NOTE — ED Notes (Signed)
Liquid dinner tray ordered

## 2020-08-01 ENCOUNTER — Observation Stay (HOSPITAL_COMMUNITY): Payer: BC Managed Care – PPO

## 2020-08-01 DIAGNOSIS — E1169 Type 2 diabetes mellitus with other specified complication: Secondary | ICD-10-CM

## 2020-08-01 DIAGNOSIS — E861 Hypovolemia: Secondary | ICD-10-CM | POA: Diagnosis present

## 2020-08-01 DIAGNOSIS — I2510 Atherosclerotic heart disease of native coronary artery without angina pectoris: Secondary | ICD-10-CM

## 2020-08-01 DIAGNOSIS — N179 Acute kidney failure, unspecified: Secondary | ICD-10-CM | POA: Diagnosis present

## 2020-08-01 DIAGNOSIS — M797 Fibromyalgia: Secondary | ICD-10-CM | POA: Diagnosis present

## 2020-08-01 DIAGNOSIS — E1159 Type 2 diabetes mellitus with other circulatory complications: Secondary | ICD-10-CM

## 2020-08-01 DIAGNOSIS — E785 Hyperlipidemia, unspecified: Secondary | ICD-10-CM | POA: Diagnosis present

## 2020-08-01 DIAGNOSIS — K219 Gastro-esophageal reflux disease without esophagitis: Secondary | ICD-10-CM | POA: Diagnosis present

## 2020-08-01 DIAGNOSIS — N184 Chronic kidney disease, stage 4 (severe): Secondary | ICD-10-CM | POA: Diagnosis present

## 2020-08-01 DIAGNOSIS — I152 Hypertension secondary to endocrine disorders: Secondary | ICD-10-CM

## 2020-08-01 DIAGNOSIS — Z20822 Contact with and (suspected) exposure to covid-19: Secondary | ICD-10-CM | POA: Diagnosis present

## 2020-08-01 DIAGNOSIS — I252 Old myocardial infarction: Secondary | ICD-10-CM | POA: Diagnosis not present

## 2020-08-01 DIAGNOSIS — Z794 Long term (current) use of insulin: Secondary | ICD-10-CM

## 2020-08-01 DIAGNOSIS — N400 Enlarged prostate without lower urinary tract symptoms: Secondary | ICD-10-CM | POA: Diagnosis present

## 2020-08-01 DIAGNOSIS — Z7901 Long term (current) use of anticoagulants: Secondary | ICD-10-CM | POA: Diagnosis not present

## 2020-08-01 DIAGNOSIS — D638 Anemia in other chronic diseases classified elsewhere: Secondary | ICD-10-CM | POA: Diagnosis not present

## 2020-08-01 DIAGNOSIS — Z79899 Other long term (current) drug therapy: Secondary | ICD-10-CM | POA: Diagnosis not present

## 2020-08-01 DIAGNOSIS — E1122 Type 2 diabetes mellitus with diabetic chronic kidney disease: Secondary | ICD-10-CM

## 2020-08-01 DIAGNOSIS — E1142 Type 2 diabetes mellitus with diabetic polyneuropathy: Secondary | ICD-10-CM | POA: Diagnosis present

## 2020-08-01 DIAGNOSIS — N1831 Chronic kidney disease, stage 3a: Secondary | ICD-10-CM

## 2020-08-01 DIAGNOSIS — E860 Dehydration: Secondary | ICD-10-CM | POA: Diagnosis present

## 2020-08-01 DIAGNOSIS — D631 Anemia in chronic kidney disease: Secondary | ICD-10-CM | POA: Diagnosis present

## 2020-08-01 DIAGNOSIS — R1310 Dysphagia, unspecified: Secondary | ICD-10-CM | POA: Diagnosis present

## 2020-08-01 DIAGNOSIS — Z87891 Personal history of nicotine dependence: Secondary | ICD-10-CM | POA: Diagnosis not present

## 2020-08-01 DIAGNOSIS — I4819 Other persistent atrial fibrillation: Secondary | ICD-10-CM | POA: Diagnosis present

## 2020-08-01 DIAGNOSIS — I130 Hypertensive heart and chronic kidney disease with heart failure and stage 1 through stage 4 chronic kidney disease, or unspecified chronic kidney disease: Secondary | ICD-10-CM | POA: Diagnosis present

## 2020-08-01 DIAGNOSIS — I5032 Chronic diastolic (congestive) heart failure: Secondary | ICD-10-CM | POA: Diagnosis present

## 2020-08-01 LAB — CBC
HCT: 26.5 % — ABNORMAL LOW (ref 39.0–52.0)
Hemoglobin: 8.8 g/dL — ABNORMAL LOW (ref 13.0–17.0)
MCH: 29.1 pg (ref 26.0–34.0)
MCHC: 33.2 g/dL (ref 30.0–36.0)
MCV: 87.7 fL (ref 80.0–100.0)
Platelets: 177 10*3/uL (ref 150–400)
RBC: 3.02 MIL/uL — ABNORMAL LOW (ref 4.22–5.81)
RDW: 13.7 % (ref 11.5–15.5)
WBC: 3.8 10*3/uL — ABNORMAL LOW (ref 4.0–10.5)
nRBC: 0 % (ref 0.0–0.2)

## 2020-08-01 LAB — BASIC METABOLIC PANEL
Anion gap: 9 (ref 5–15)
BUN: 42 mg/dL — ABNORMAL HIGH (ref 8–23)
CO2: 22 mmol/L (ref 22–32)
Calcium: 7.7 mg/dL — ABNORMAL LOW (ref 8.9–10.3)
Chloride: 105 mmol/L (ref 98–111)
Creatinine, Ser: 4.34 mg/dL — ABNORMAL HIGH (ref 0.61–1.24)
GFR calc Af Amer: 15 mL/min — ABNORMAL LOW (ref >60)
GFR calc non Af Amer: 13 mL/min — ABNORMAL LOW (ref >60)
Glucose, Bld: 127 mg/dL — ABNORMAL HIGH (ref 70–99)
Potassium: 3.9 mmol/L (ref 3.5–5.1)
Sodium: 136 mmol/L (ref 135–145)

## 2020-08-01 LAB — GLUCOSE, CAPILLARY
Glucose-Capillary: 118 mg/dL — ABNORMAL HIGH (ref 70–99)
Glucose-Capillary: 124 mg/dL — ABNORMAL HIGH (ref 70–99)
Glucose-Capillary: 136 mg/dL — ABNORMAL HIGH (ref 70–99)
Glucose-Capillary: 119 mg/dL — ABNORMAL HIGH (ref 70–99)

## 2020-08-01 LAB — HIV ANTIBODY (ROUTINE TESTING W REFLEX): HIV Screen 4th Generation wRfx: NONREACTIVE

## 2020-08-01 LAB — HEMOGLOBIN A1C
Hgb A1c MFr Bld: 7.3 % — ABNORMAL HIGH (ref 4.8–5.6)
Mean Plasma Glucose: 162.81 mg/dL

## 2020-08-01 MED ORDER — DARBEPOETIN ALFA 25 MCG/0.42ML IJ SOSY
25.0000 ug | PREFILLED_SYRINGE | INTRAMUSCULAR | Status: DC
  Filled 2020-08-01: qty 0.42

## 2020-08-01 MED ORDER — CLONIDINE HCL 0.1 MG PO TABS: 0.1000 mg | ORAL_TABLET | Freq: Two times a day (BID) | ORAL | Status: DC

## 2020-08-01 MED ORDER — GABAPENTIN 600 MG PO TABS
300.0000 mg | ORAL_TABLET | Freq: Two times a day (BID) | ORAL | Status: DC
  Administered 2020-08-01 – 2020-08-02 (×3): 300 mg via ORAL
  Filled 2020-08-01 (×2): qty 1

## 2020-08-01 MED ORDER — CLONIDINE HCL 0.1 MG PO TABS
0.1000 mg | ORAL_TABLET | Freq: Three times a day (TID) | ORAL | Status: DC
  Administered 2020-08-01 – 2020-08-02 (×3): 0.1 mg via ORAL
  Filled 2020-08-01 (×3): qty 1

## 2020-08-01 MED ORDER — NITROGLYCERIN 0.4 MG SL SUBL: 0.4000 mg | SUBLINGUAL_TABLET | SUBLINGUAL | Status: DC | PRN

## 2020-08-01 NOTE — Progress Notes (Signed)
Due to AKI, ok to reduce gabapentin to 300mg  PO BID per Dr. Wynelle Cleveland.  Onnie Boer, PharmD, BCIDP, AAHIVP, CPP Infectious Disease Pharmacist 08/01/2020 3:07 PM

## 2020-08-01 NOTE — Progress Notes (Signed)
Pt concerned about cbg dropping given, pt on clear liquid diet d/t difficulty swallowing juice and broth provided to the patient. Ambulated to the bathroom stand by assist. Educated pt on MD to round and decided plan of care.
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
## 2020-08-02 DIAGNOSIS — E860 Dehydration: Secondary | ICD-10-CM

## 2020-08-02 DIAGNOSIS — I480 Paroxysmal atrial fibrillation: Secondary | ICD-10-CM

## 2020-08-02 LAB — BASIC METABOLIC PANEL
Anion gap: 9 (ref 5–15)
BUN: 35 mg/dL — ABNORMAL HIGH (ref 8–23)
CO2: 24 mmol/L (ref 22–32)
Calcium: 7.4 mg/dL — ABNORMAL LOW (ref 8.9–10.3)
Chloride: 104 mmol/L (ref 98–111)
Creatinine, Ser: 4.1 mg/dL — ABNORMAL HIGH (ref 0.61–1.24)
GFR calc Af Amer: 16 mL/min — ABNORMAL LOW (ref >60)
GFR calc non Af Amer: 14 mL/min — ABNORMAL LOW (ref >60)
Glucose, Bld: 155 mg/dL — ABNORMAL HIGH (ref 70–99)
Potassium: 3.6 mmol/L (ref 3.5–5.1)
Sodium: 137 mmol/L (ref 135–145)

## 2020-08-02 LAB — GLUCOSE, CAPILLARY
Glucose-Capillary: 150 mg/dL — ABNORMAL HIGH (ref 70–99)
Glucose-Capillary: 135 mg/dL — ABNORMAL HIGH (ref 70–99)

## 2020-08-02 MED ORDER — ISOSORBIDE MONONITRATE ER 60 MG PO TB24
60.0000 mg | ORAL_TABLET | Freq: Every day | ORAL | 0 refills | Status: DC
Start: 2020-08-03 — End: 2024-01-23

## 2020-08-02 MED ORDER — HYDRALAZINE HCL 20 MG/ML IJ SOLN
10.0000 mg | Freq: Once | INTRAMUSCULAR | Status: AC
  Administered 2020-08-02: 10 mg via INTRAVENOUS
  Filled 2020-08-02: qty 1

## 2020-08-02 MED ORDER — GABAPENTIN 600 MG PO TABS: 300.0000 mg | ORAL_TABLET | Freq: Two times a day (BID) | ORAL | 0 refills | Status: DC

## 2020-08-02 MED ORDER — ONDANSETRON HCL 4 MG PO TABS: 4.0000 mg | ORAL_TABLET | Freq: Three times a day (TID) | ORAL | 0 refills | Status: DC | PRN

## 2020-08-02 MED ORDER — ISOSORBIDE MONONITRATE ER 60 MG PO TB24: 60.0000 mg | ORAL_TABLET | Freq: Every day | ORAL | Status: DC

## 2020-08-02 MED ORDER — CLONIDINE HCL 0.1 MG PO TABS: 0.1000 mg | ORAL_TABLET | Freq: Three times a day (TID) | ORAL | 0 refills | Status: DC

## 2020-08-02 NOTE — Discharge Instructions (Signed)
Please weigh yourself daily and take this information to you doctor.  Please check you BP daily and take to doctor.     Information on my medicine - ELIQUIS (apixaban)  This medication education was reviewed with me or my healthcare representative as part of my discharge preparation.  The pharmacist that spoke with me during my hospital stay was:  Onnie Boer, RPH-CPP  Why was Eliquis prescribed for you? Eliquis was prescribed for you to reduce the risk of a blood clot forming that can cause a stroke if you have a medical condition called atrial fibrillation (a type of irregular heartbeat).  What do You need to know about Eliquis ? Take your Eliquis TWICE DAILY - one tablet in the morning and one tablet in the evening with or without food. If you have difficulty swallowing the tablet whole please discuss with your pharmacist how to take the medication safely.  Take Eliquis exactly as prescribed by your doctor and DO NOT stop taking Eliquis without talking to the doctor who prescribed the medication.  Stopping may increase your risk of developing a stroke.  Refill your prescription before you run out.  After discharge, you should have regular check-up appointments with your healthcare provider that is prescribing your Eliquis.  In the future your dose may need to be changed if your kidney function or weight changes by a significant amount or as you get older.  What do you do if you miss a dose? If you miss a dose, take it as soon as you remember on the same day and resume taking twice daily.  Do not take more than one dose of ELIQUIS at the same time to make up a missed dose.  Important Safety Information A possible side effect of Eliquis is bleeding. You should call your healthcare provider right away if you experience any of the following: ? Bleeding from an injury or your nose that does not stop. ? Unusual colored urine (red or dark brown) or unusual colored stools (red or  black). ? Unusual bruising for unknown reasons. ? A serious fall or if you hit your head (even if there is no bleeding).  Some medicines may interact with Eliquis and might increase your risk of bleeding or clotting while on Eliquis. To help avoid this, consult your healthcare provider or pharmacist prior to using any new prescription or non-prescription medications, including herbals, vitamins, non-steroidal anti-inflammatory drugs (NSAIDs) and supplements.  This website has more information on Eliquis (apixaban): http://www.eliquis.com/eliquis/home
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
## 2020-08-02 NOTE — Progress Notes (Signed)
[facility] notified that patient BP 192/85 scheduled BP medications given recheck 178/87 Asymptomatic. Arthor Captain LPN
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
## 2020-08-02 NOTE — Progress Notes (Signed)
[facility] f/u BP post Hydralazine 149/69 hr 68.

## 2020-08-04 ENCOUNTER — Other Ambulatory Visit: Payer: Self-pay

## 2020-08-04 NOTE — Patient Outreach (Signed)
[place] [facility]) Care Management  08/04/2020  [place] December 04, 1950 [id number]      Transition of Care Referral  Referral Date: 08/04/2020 Referral Source: [facility] Discharge Report Date of Discharge: 08/02/2020 Facility: [place]   Referral received. Transition of care calls being completed via EMMI-automated calls. William Webb CM will outreach patient for any red flags received.      Plan: William Webb CM will close case at this time.   William Montgomery, William Webb,William Webb,William Webb [place] Management Telephonic Care Management Coordinator Direct Phone: [phone] Toll Free: [phone] Fax: [phone]

## 2020-08-09 ENCOUNTER — Other Ambulatory Visit: Payer: Self-pay

## 2020-08-09 NOTE — Patient Outreach (Signed)
[place] [facility]) Care Management  08/09/2020  [place] Dec 02, 1950 [id number]    EMMI-General Discharge RED ON EMMI ALERT Day # 4 Date: 08/08/2020 Red Alert Reason: "Other questions/problems? Yes"   Outreach attempt # 1 to patient. No answer after multiple rings and unable to leave message.       Plan: RN CM will make outreach attempt to patient within 3-4 business days. RN CM will send unsuccessful outreach letter to patient.  Enzo Montgomery, RN,BSN,CCM [place] Management Telephonic Care Management Coordinator Direct Phone: [phone] Toll Free: [phone] Fax: [phone]

## 2020-08-10 ENCOUNTER — Other Ambulatory Visit: Payer: Self-pay

## 2020-08-10 NOTE — Patient Outreach (Signed)
[place] [facility]) Care Management  08/10/2020  [place] 06-22-1951 [id number]   EMMI-General Discharge RED ON EMMI ALERT Day # 4 Date: 08/08/2020 Red Alert Reason: "Other questions/problems? Yes   Outreach attempt #2 to patient. Spoke with patient. Reviewed and addressed red alert. He reports that he fell yesterday while in the bathroom getting dressed. The door was nearby adn helped him "catch his fall." RN CM reviewed fall and safety measures. He admits that he think he tripped on bathroom rugs. Encouraged patient to remove loose rugs. He was not using cane at the time. Reinforced importance of using DME at all times. He lives alone but state she hs supportive niece who is a CNA who visits him often. He goes for MD follow up appt tomorrow. He confirms he hs all his meds and no issues regarding them. He denies any RN CM needs or concerns at this time. Patient has completed post discharge automated calls.      Plan: RN CM will close case at this time.   Enzo Montgomery, RN,BSN,CCM [place] Management Telephonic Care Management Coordinator Direct Phone: [phone] Toll Free: [phone] Fax: [phone]

## 2020-08-11 ENCOUNTER — Encounter: Payer: Self-pay | Admitting: Cardiology

## 2020-08-11 ENCOUNTER — Ambulatory Visit: Payer: BC Managed Care – PPO | Admitting: Cardiology

## 2020-08-11 ENCOUNTER — Other Ambulatory Visit: Payer: Self-pay

## 2020-08-11 VITALS — BP 178/84 | HR 65 | Resp 16 | Ht 75.0 in | Wt 208.0 lb

## 2020-08-11 DIAGNOSIS — I2510 Atherosclerotic heart disease of native coronary artery without angina pectoris: Secondary | ICD-10-CM

## 2020-08-11 DIAGNOSIS — I10 Essential (primary) hypertension: Secondary | ICD-10-CM

## 2020-08-11 DIAGNOSIS — N184 Chronic kidney disease, stage 4 (severe): Secondary | ICD-10-CM | POA: Insufficient documentation

## 2020-08-11 DIAGNOSIS — I739 Peripheral vascular disease, unspecified: Secondary | ICD-10-CM

## 2020-08-11 NOTE — Progress Notes (Signed)
Patient is here for follow up visit.  Subjective:   William Webb [facility], male    DOB: [birth year], 69 y.o.   MRN: [id number]   Chief Complaint  Patient presents with  . Hypertension  . Congestive Heart Failure  . Follow-up    3 month   HPI  69 y.o. African American male with uncontrolled hypertension, type 2 DM, hyperlipidemia, advanced CKD stage, persistent Afib, now in sinus rhythm s/p cardioversion  Patient was recently hospitalized with nausea, vomiting. He was found to have AKI/CKD. He was recommended to use lasix only prn.  He is also found to have dysphagia, which has improved with use of Imdur.  Patient is now also on clonidine for hypertension.  Blood pressure remains elevated.  Today, he tells me that he "falls a lot", whenever he closes his eyes.  He has appointments coming up with his PCP and nephrologist in the coming week.  Today, he prefers not to take his shoes and socks off, because "they are difficulty".  However, He denies having any open ulcers or wounds.  He does not have any resting pain.   Current Outpatient Medications on File Prior to Visit  Medication Sig Dispense Refill  . acetaminophen (TYLENOL) 500 MG tablet Take 1,000 mg by mouth every 6 (six) hours as needed for headache (pain).    Marland Kitchen amLODipine (NORVASC) 10 MG tablet Take 1 tablet (10 mg total) by mouth daily. 90 tablet 3  . Blood Glucose Monitoring Suppl (ACCU-CHEK GUIDE ME) w/Device KIT 1 Device by Does not apply route 4 (four) times daily -  before meals and at bedtime. 1 kit 0  . carvedilol (COREG) 25 MG tablet Take 1 tablet (25 mg total) by mouth 2 (two) times daily. 180 tablet 1  . Cholecalciferol (VITAMIN D3) 50 MCG (2000 UT) TABS Take 2,000 Units by mouth daily.    . cloNIDine (CATAPRES) 0.1 MG tablet Take 1 tablet (0.1 mg total) by mouth 3 (three) times daily. 60 tablet 0  . Continuous Blood Gluc Sensor (FREESTYLE LIBRE 14 DAY SENSOR) MISC 1 Device by Does not apply route every 14  (fourteen) days. 6 each 3  . Cyanocobalamin 2500 MCG TABS Take 2,500 mcg by mouth daily. Vitamin b12    . Dextromethorphan-guaiFENesin (CORICIDIN HBP CONGESTION/COUGH) 10-200 MG CAPS Take 2 capsules by mouth daily as needed (cough/congestion).    Marland Kitchen ELIQUIS 5 MG TABS tablet TAKE 1 TABLET BY MOUTH TWICE A DAY (Patient taking differently: Take 5 mg by mouth 2 (two) times daily. ) 60 tablet 3  . Ensure (ENSURE) Take 237 mLs by mouth 2 (two) times daily.    Marland Kitchen gabapentin (NEURONTIN) 600 MG tablet Take 0.5 tablets (300 mg total) by mouth 2 (two) times daily. 60 tablet 0  . glucose 4 GM chewable tablet Chew 1 tablet by mouth as needed for low blood sugar.    . hydrALAZINE (APRESOLINE) 25 MG tablet Take 12.5 mg by mouth 3 (three) times daily.    . insulin aspart (NOVOLOG FLEXPEN) 100 UNIT/ML FlexPen Inject 3 Units into the skin 3 (three) times daily with meals. (Patient taking differently: Inject 3-4 Units into the skin 2 (two) times daily as needed for high blood sugar (CBG >140). Monitor blood sugar free style libre range is on the meter) 15 mL 11  . isosorbide mononitrate (IMDUR) 60 MG 24 hr tablet Take 1 tablet (60 mg total) by mouth daily. 30 tablet 0  . linaclotide (LINZESS) 145 MCG CAPS capsule  Take 1 capsule (145 mcg total) by mouth daily as needed (constipation). 30 capsule 0  . montelukast (SINGULAIR) 10 MG tablet Take 1 tablet (10 mg total) by mouth at bedtime. 90 tablet 3  . omeprazole (PRILOSEC OTC) 20 MG tablet Take 20 mg by mouth daily.    . ondansetron (ZOFRAN) 4 MG tablet Take 1 tablet (4 mg total) by mouth every 8 (eight) hours as needed for nausea. 20 tablet 0  . rosuvastatin (CRESTOR) 20 MG tablet TAKE 1 TABLET (20 MG TOTAL) BY MOUTH DAILY AT 6 PM. 90 tablet 1  . tamsulosin (FLOMAX) 0.4 MG CAPS capsule Take 0.4 mg by mouth at bedtime.    . vitamin E (VITAMIN E) 1000 UNIT capsule Take 1,000 Units by mouth daily.      No current facility-administered medications on file prior to visit.     Cardiovascular studies:  Lower Extremity Arterial Duplex 01/18/2020:  No hemodynamically significant stenosis noted in the bilateral common and  superficial femoral arteries.  Bilateral anterior and post tibial spectral waveform demonstrates a  monophasic flow pattern suggestive of severe diffuse small vessel disease.  This exam reveals normal perfusion of the right and left lower extremity  (ABI 0.97).   The ABI may be falsely elevated in a patient with arteriosclerosis.  Consider further work up for CLI if clinically indicated.  EKG 01/08/2020: Sinus rhythm 66 bpm. Left ventricular hypertrophy. ST-T changes related to LVH.  Coronary angiogram 10/20/2018: LM: Normal LAD: Distal 70% diffuse disease.        Ostial 90% stenosis in Diag1. Patent prox Diag 1 stent LCx: Large OM1 with patent prior stent RCA: 100% occluded RPDA        Partially successful PTCA attempt        100%--99% stenosis        TIMI flow 0-I  LVEDP 16 mmHg  Echocardiogram 10/17/2018: Left ventricle: The cavity size was normal. Wall thickness was increased in a pattern of severe LVH. Systolic function was normal. The estimated ejection fraction was in the range of 50% to 55%. Wall motion was normal; there were no regional wall motion abnormalities. Doppler parameters are consistent with anormal left ventricular relaxation (grade 1 diastolic dysfunction). Doppler parameters are consistent with high ventricular filling pressure. - Left atrium: The atrium was moderately dilated. - Right atrium: The atrium was mildly dilated.  Impressions: - Low normal to mildly reduced LV systolic function; EF 50; severe LVH; mild diastolic dysfunction; biatrial enlargement.  Recent Labs: 08/02/2020: Glucose 155, BUN/Cr 35/4.1. EGFR 16. Na/K 137/3.6.  H/H 8.8/26.5. MCV 87. Platelets 177 HbA1C 7.3%   03/01/2020: Hemoglobin 11  Iron 60, TIBC 231.  01/06/2020: Glucose 173, BUN/Cr 43/2.9.  Na/K 137/4.8. Rest of the  CMP normal  12/11/2019: Glucose 151. BUN/Cr 28/2.6. HbA1C 7.7%.   11//2020: Glucose 177, BUN/Cr 27/2.72. EGFR 23. Na/K 140/4.5. Albumin 2.4 low. Rest of the CMP normal H/H 8.9/27.8. MCV 86. Platelets 177 HbA1C 8.0%   02/2018: Chol 162, TG 95, HDL 37, LDL 105  Review of Systems  Constitutional: Negative for malaise/fatigue.  Cardiovascular: Positive for leg swelling (Improved). Negative for chest pain, dyspnea on exertion, palpitations and syncope.       LE ulcer   Respiratory: Negative for shortness of breath.   Genitourinary:       Erectile dysfunction  All other systems reviewed and are negative.      Objective:    Vitals:   08/11/20 1401  BP: (!) 178/84  Pulse: 65  Resp: 16  SpO2: 95%     Physical Exam Vitals and nursing note reviewed.  Constitutional:      General: He is not in acute distress. Neck:     Vascular: No JVD.  Cardiovascular:     Rate and Rhythm: Normal rate. Rhythm irregularly irregular.     Heart sounds: Murmur (Flow murmur likely due to LUE AV fistula) heard.      Comments: Not performed today, as per patient's wishes. Pulmonary:     Effort: Pulmonary effort is normal.     Breath sounds: Normal breath sounds. No wheezing or rales.         Assessment & Recommendations:   69 y.o. African American male with uncontrolled hypertension, type 2 DM, hyperlipidemia, advanced CKD stage, persistent Afib, now in sinus rhythm s/p cardioversion, severe PAD.    PAD: Toe ulcer healed well.  He likely has severe PAD, especially below the knee. His ulcer has healed well, he does not have any claudication. He has advanced CKD, but not on dialysis yet. CO2 angiogram will likely be inadequate to evaluate and intervene on below the knee disease. In order to preserve his renal function, I would hold off contrast angiogram unless he develps critical limb ischemia (nonhealing ulcer, resting pain, gangrene). I counseled patient to check for any injuries on his  feet on a daily basis. If any new wounds appears, he should contact us immediately.  Continue current medical therapy, including, statin Not on Aspirin due to ongoing use of eliquis and prior h/o GI bleeding.   Atrial fibrillation: Persistent, now in sinus rhythm s/p cardioversion 09/29/2019. CHA2DS2VASc score 4, annual stroke risk 5%. Continue eliquis 5 mg bid.   CAD: CAD s/p prior PCI, NSTEMI in 09/2018, attempted but unsuccessful. PTCA to chronically occluded Rt PDA Stable without angina symptoms. Given his advanced CKD, I do not recommend further revascualrization attempts.  Hypertension: Uncontrolled, primarily related to advanced CKD.  Management per nephrology.    Mild bilateral carotid stenosis: Continue medical management  F/u in 3 months  [place], MD [facility] [place] Pager: [phone] Office: [phone] If no answer Cell [phone]

## 2020-08-18 ENCOUNTER — Ambulatory Visit: Payer: Self-pay | Admitting: Cardiology

## 2020-08-24 ENCOUNTER — Telehealth: Payer: Self-pay

## 2020-08-24 NOTE — Telephone Encounter (Signed)
Ok  Keep me posted

## 2020-08-24 NOTE — Telephone Encounter (Signed)
Pt called to inform us that Eliquis is too expensive. Informed pt to try to apply for patient assistance.

## 2020-08-26 ENCOUNTER — Other Ambulatory Visit: Payer: Self-pay

## 2020-08-26 ENCOUNTER — Ambulatory Visit (HOSPITAL_COMMUNITY)
Admission: RE | Admit: 2020-08-26 | Discharge: 2020-08-26 | Disposition: A | Discharge status: Home / Self Care | Payer: BC Managed Care – PPO | Source: Ambulatory Visit | Attending: Nephrology | Admitting: Nephrology

## 2020-08-26 VITALS — BP 160/64 | HR 68 | Temp 98.7°F | Resp 20

## 2020-08-26 DIAGNOSIS — D631 Anemia in chronic kidney disease: Secondary | ICD-10-CM | POA: Insufficient documentation

## 2020-08-26 DIAGNOSIS — N1831 Chronic kidney disease, stage 3a: Secondary | ICD-10-CM | POA: Insufficient documentation

## 2020-08-26 LAB — IRON AND TIBC
Iron: 45 ug/dL (ref 45–182)
Saturation Ratios: 24 % (ref 17.9–39.5)
TIBC: 186 ug/dL — ABNORMAL LOW (ref 250–450)
UIBC: 141 ug/dL

## 2020-08-26 LAB — FERRITIN: Ferritin: 113 ng/mL (ref 24–336)

## 2020-08-26 MED ORDER — EPOETIN ALFA-EPBX 10000 UNIT/ML IJ SOLN
INTRAMUSCULAR | Status: AC
  Administered 2020-08-26: 10000 [IU] via SUBCUTANEOUS
  Filled 2020-08-26: qty 1

## 2020-08-26 MED ORDER — EPOETIN ALFA-EPBX 10000 UNIT/ML IJ SOLN: 10000.0000 [IU] | INTRAMUSCULAR | Status: DC

## 2020-08-29 LAB — POCT HEMOGLOBIN-HEMACUE: Hemoglobin: 8.8 g/dL — ABNORMAL LOW (ref 13.0–17.0)

## 2020-08-31 ENCOUNTER — Telehealth: Payer: Self-pay

## 2020-08-31 ENCOUNTER — Other Ambulatory Visit: Payer: Self-pay | Admitting: Endocrinology

## 2020-08-31 NOTE — Telephone Encounter (Signed)
  LAST APPOINTMENT DATE: 11/26/19  NEXT APPOINTMENT DATE:@Visit  date not found  MEDICATION: NOVOLOG FLEXPEN 100 UNIT/ML FlexPen  PHARMACY:  [id number] - [place], [place] - [place] (Ph: [phone])  COMMENTS: Patient states he has possibly has 2 weeks left, and did not want to schedule an appointment at this time.    Please advise    .

## 2020-08-31 NOTE — Telephone Encounter (Signed)
Refill has been sent.  Patient needs follow up.

## 2020-09-01 ENCOUNTER — Telehealth: Payer: Self-pay

## 2020-09-01 NOTE — Telephone Encounter (Signed)
Patient assistance was faxed for eliquis

## 2020-09-09 ENCOUNTER — Inpatient Hospital Stay (HOSPITAL_COMMUNITY): Admission: RE | Admit: 2020-09-09 | Payer: BC Managed Care – PPO | Source: Ambulatory Visit

## 2020-09-15 ENCOUNTER — Observation Stay (HOSPITAL_COMMUNITY): Payer: BC Managed Care – PPO

## 2020-09-15 ENCOUNTER — Telehealth: Payer: Self-pay

## 2020-09-15 ENCOUNTER — Inpatient Hospital Stay (HOSPITAL_COMMUNITY)
Admission: EM | Admit: 2020-09-15 | Discharge: 2020-09-17 | DRG: 312 | Disposition: A | Discharge status: Home / Self Care | Payer: BC Managed Care – PPO | Attending: Internal Medicine | Admitting: Internal Medicine

## 2020-09-15 ENCOUNTER — Other Ambulatory Visit: Payer: Self-pay

## 2020-09-15 ENCOUNTER — Ambulatory Visit (HOSPITAL_COMMUNITY)
Admission: RE | Admit: 2020-09-15 | Discharge: 2020-09-15 | Disposition: A | Discharge status: Home / Self Care | Payer: BC Managed Care – PPO | Source: Ambulatory Visit | Attending: Nephrology | Admitting: Nephrology

## 2020-09-15 ENCOUNTER — Encounter (HOSPITAL_COMMUNITY): Payer: Self-pay | Admitting: Emergency Medicine

## 2020-09-15 ENCOUNTER — Emergency Department (HOSPITAL_COMMUNITY): Payer: BC Managed Care – PPO

## 2020-09-15 VITALS — BP 131/72 | HR 74 | Temp 97.1°F | Resp 20

## 2020-09-15 DIAGNOSIS — R4020 Unspecified coma: Secondary | ICD-10-CM | POA: Diagnosis present

## 2020-09-15 DIAGNOSIS — I130 Hypertensive heart and chronic kidney disease with heart failure and stage 1 through stage 4 chronic kidney disease, or unspecified chronic kidney disease: Secondary | ICD-10-CM | POA: Diagnosis present

## 2020-09-15 DIAGNOSIS — N1831 Chronic kidney disease, stage 3a: Secondary | ICD-10-CM

## 2020-09-15 DIAGNOSIS — D631 Anemia in chronic kidney disease: Secondary | ICD-10-CM | POA: Diagnosis present

## 2020-09-15 DIAGNOSIS — E1142 Type 2 diabetes mellitus with diabetic polyneuropathy: Secondary | ICD-10-CM | POA: Diagnosis present

## 2020-09-15 DIAGNOSIS — N184 Chronic kidney disease, stage 4 (severe): Secondary | ICD-10-CM | POA: Diagnosis present

## 2020-09-15 DIAGNOSIS — I313 Pericardial effusion (noninflammatory): Secondary | ICD-10-CM | POA: Diagnosis present

## 2020-09-15 DIAGNOSIS — Z7902 Long term (current) use of antithrombotics/antiplatelets: Secondary | ICD-10-CM

## 2020-09-15 DIAGNOSIS — E1136 Type 2 diabetes mellitus with diabetic cataract: Secondary | ICD-10-CM | POA: Diagnosis present

## 2020-09-15 DIAGNOSIS — I2510 Atherosclerotic heart disease of native coronary artery without angina pectoris: Secondary | ICD-10-CM | POA: Diagnosis present

## 2020-09-15 DIAGNOSIS — Z833 Family history of diabetes mellitus: Secondary | ICD-10-CM

## 2020-09-15 DIAGNOSIS — E785 Hyperlipidemia, unspecified: Secondary | ICD-10-CM | POA: Diagnosis present

## 2020-09-15 DIAGNOSIS — Z8249 Family history of ischemic heart disease and other diseases of the circulatory system: Secondary | ICD-10-CM

## 2020-09-15 DIAGNOSIS — E1122 Type 2 diabetes mellitus with diabetic chronic kidney disease: Secondary | ICD-10-CM | POA: Diagnosis present

## 2020-09-15 DIAGNOSIS — E11319 Type 2 diabetes mellitus with unspecified diabetic retinopathy without macular edema: Secondary | ICD-10-CM | POA: Diagnosis present

## 2020-09-15 DIAGNOSIS — Z87891 Personal history of nicotine dependence: Secondary | ICD-10-CM

## 2020-09-15 DIAGNOSIS — I480 Paroxysmal atrial fibrillation: Secondary | ICD-10-CM | POA: Diagnosis present

## 2020-09-15 DIAGNOSIS — Z794 Long term (current) use of insulin: Secondary | ICD-10-CM

## 2020-09-15 DIAGNOSIS — K219 Gastro-esophageal reflux disease without esophagitis: Secondary | ICD-10-CM | POA: Diagnosis present

## 2020-09-15 DIAGNOSIS — R0602 Shortness of breath: Secondary | ICD-10-CM

## 2020-09-15 DIAGNOSIS — Z83438 Family history of other disorder of lipoprotein metabolism and other lipidemia: Secondary | ICD-10-CM

## 2020-09-15 DIAGNOSIS — I361 Nonrheumatic tricuspid (valve) insufficiency: Secondary | ICD-10-CM | POA: Diagnosis present

## 2020-09-15 DIAGNOSIS — Z96651 Presence of right artificial knee joint: Secondary | ICD-10-CM | POA: Diagnosis present

## 2020-09-15 DIAGNOSIS — R55 Syncope and collapse: Secondary | ICD-10-CM | POA: Diagnosis not present

## 2020-09-15 DIAGNOSIS — J90 Pleural effusion, not elsewhere classified: Secondary | ICD-10-CM

## 2020-09-15 DIAGNOSIS — Z79899 Other long term (current) drug therapy: Secondary | ICD-10-CM

## 2020-09-15 DIAGNOSIS — M797 Fibromyalgia: Secondary | ICD-10-CM | POA: Diagnosis present

## 2020-09-15 DIAGNOSIS — N400 Enlarged prostate without lower urinary tract symptoms: Secondary | ICD-10-CM | POA: Diagnosis present

## 2020-09-15 DIAGNOSIS — Z20822 Contact with and (suspected) exposure to covid-19: Secondary | ICD-10-CM | POA: Diagnosis present

## 2020-09-15 DIAGNOSIS — Z9841 Cataract extraction status, right eye: Secondary | ICD-10-CM

## 2020-09-15 DIAGNOSIS — I252 Old myocardial infarction: Secondary | ICD-10-CM

## 2020-09-15 DIAGNOSIS — I5032 Chronic diastolic (congestive) heart failure: Secondary | ICD-10-CM | POA: Diagnosis present

## 2020-09-15 DIAGNOSIS — E1151 Type 2 diabetes mellitus with diabetic peripheral angiopathy without gangrene: Secondary | ICD-10-CM | POA: Diagnosis present

## 2020-09-15 DIAGNOSIS — M810 Age-related osteoporosis without current pathological fracture: Secondary | ICD-10-CM | POA: Diagnosis present

## 2020-09-15 DIAGNOSIS — Z800 Family history of malignant neoplasm of digestive organs: Secondary | ICD-10-CM

## 2020-09-15 DIAGNOSIS — J918 Pleural effusion in other conditions classified elsewhere: Secondary | ICD-10-CM | POA: Diagnosis present

## 2020-09-15 DIAGNOSIS — I272 Pulmonary hypertension, unspecified: Secondary | ICD-10-CM | POA: Diagnosis present

## 2020-09-15 LAB — CBC WITH DIFFERENTIAL/PLATELET
Abs Immature Granulocytes: 0.03 10*3/uL (ref 0.00–0.07)
Basophils Absolute: 0 10*3/uL (ref 0.0–0.1)
Basophils Relative: 1 %
Eosinophils Absolute: 0.1 10*3/uL (ref 0.0–0.5)
Eosinophils Relative: 3 %
HCT: 29 % — ABNORMAL LOW (ref 39.0–52.0)
Hemoglobin: 9.3 g/dL — ABNORMAL LOW (ref 13.0–17.0)
Immature Granulocytes: 1 %
Lymphocytes Relative: 14 %
Lymphs Abs: 0.5 10*3/uL — ABNORMAL LOW (ref 0.7–4.0)
MCH: 29.4 pg (ref 26.0–34.0)
MCHC: 32.1 g/dL (ref 30.0–36.0)
MCV: 91.8 fL (ref 80.0–100.0)
Monocytes Absolute: 0.3 10*3/uL (ref 0.1–1.0)
Monocytes Relative: 7 %
Neutro Abs: 2.8 10*3/uL (ref 1.7–7.7)
Neutrophils Relative %: 74 %
Platelets: 173 10*3/uL (ref 150–400)
RBC: 3.16 MIL/uL — ABNORMAL LOW (ref 4.22–5.81)
RDW: 13.7 % (ref 11.5–15.5)
WBC: 3.7 10*3/uL — ABNORMAL LOW (ref 4.0–10.5)
nRBC: 0 % (ref 0.0–0.2)

## 2020-09-15 LAB — BASIC METABOLIC PANEL
Anion gap: 7 (ref 5–15)
BUN: 33 mg/dL — ABNORMAL HIGH (ref 8–23)
CO2: 22 mmol/L (ref 22–32)
Calcium: 8 mg/dL — ABNORMAL LOW (ref 8.9–10.3)
Chloride: 108 mmol/L (ref 98–111)
Creatinine, Ser: 4.08 mg/dL — ABNORMAL HIGH (ref 0.61–1.24)
GFR, Estimated: 15 mL/min — ABNORMAL LOW (ref >60)
Glucose, Bld: 130 mg/dL — ABNORMAL HIGH (ref 70–99)
Potassium: 4.8 mmol/L (ref 3.5–5.1)
Sodium: 137 mmol/L (ref 135–145)

## 2020-09-15 LAB — ECHOCARDIOGRAM COMPLETE
AR max vel: 1.24 cm2
AV Area VTI: 1.31 cm2
AV Area mean vel: 1.19 cm2
AV Mean grad: 11.3 mmHg
AV Peak grad: 20.7 mmHg
Ao pk vel: 2.28 m/s
Area-P 1/2: 3.91 cm2
Calc EF: 50.5 %
Height: 75 in
S' Lateral: 3.6 cm
Single Plane A2C EF: 56.4 %
Single Plane A4C EF: 43.3 %
Weight: 3216 oz

## 2020-09-15 LAB — GLUCOSE, CAPILLARY
Glucose-Capillary: 215 mg/dL — ABNORMAL HIGH (ref 70–99)
Glucose-Capillary: 228 mg/dL — ABNORMAL HIGH (ref 70–99)

## 2020-09-15 LAB — RESPIRATORY PANEL BY RT PCR (FLU A&B, COVID)
Influenza A by PCR: NEGATIVE
Influenza B by PCR: NEGATIVE
SARS Coronavirus 2 by RT PCR: NEGATIVE

## 2020-09-15 LAB — CBG MONITORING, ED: Glucose-Capillary: 115 mg/dL — ABNORMAL HIGH (ref 70–99)

## 2020-09-15 LAB — POCT HEMOGLOBIN-HEMACUE: Hemoglobin: 9.7 g/dL — ABNORMAL LOW (ref 13.0–17.0)

## 2020-09-15 MED ORDER — GABAPENTIN 300 MG PO CAPS
300.0000 mg | ORAL_CAPSULE | Freq: Three times a day (TID) | ORAL | Status: DC
  Administered 2020-09-15 – 2020-09-17 (×7): 300 mg via ORAL
  Filled 2020-09-15 (×7): qty 1

## 2020-09-15 MED ORDER — APIXABAN 5 MG PO TABS
5.0000 mg | ORAL_TABLET | Freq: Two times a day (BID) | ORAL | Status: DC
  Administered 2020-09-15: 5 mg via ORAL
  Filled 2020-09-15: qty 1

## 2020-09-15 MED ORDER — HYDRALAZINE HCL 25 MG PO TABS
12.5000 mg | ORAL_TABLET | Freq: Three times a day (TID) | ORAL | Status: DC
  Filled 2020-09-15: qty 1

## 2020-09-15 MED ORDER — VITAMIN D-3 125 MCG (5000 UT) PO TABS: 5000.0000 ug | ORAL_TABLET | Freq: Every day | ORAL | Status: DC

## 2020-09-15 MED ORDER — ACETAMINOPHEN 325 MG PO TABS
650.0000 mg | ORAL_TABLET | Freq: Three times a day (TID) | ORAL | Status: DC | PRN
  Administered 2020-09-16 – 2020-09-17 (×2): 650 mg via ORAL
  Filled 2020-09-15 (×2): qty 2

## 2020-09-15 MED ORDER — PANTOPRAZOLE SODIUM 40 MG PO TBEC
40.0000 mg | DELAYED_RELEASE_TABLET | Freq: Every day | ORAL | Status: DC
  Administered 2020-09-15 – 2020-09-17 (×2): 40 mg via ORAL
  Filled 2020-09-15 (×3): qty 1

## 2020-09-15 MED ORDER — HYDRALAZINE HCL 25 MG PO TABS
25.0000 mg | ORAL_TABLET | Freq: Three times a day (TID) | ORAL | Status: DC
  Administered 2020-09-15 – 2020-09-17 (×7): 25 mg via ORAL
  Filled 2020-09-15 (×6): qty 1

## 2020-09-15 MED ORDER — INSULIN ASPART 100 UNIT/ML ~~LOC~~ SOLN
0.0000 [IU] | Freq: Every day | SUBCUTANEOUS | Status: DC
  Administered 2020-09-15: 2 [IU] via SUBCUTANEOUS

## 2020-09-15 MED ORDER — GUAIFENESIN-DM 100-10 MG/5ML PO SYRP: 5.0000 mL | ORAL_SOLUTION | Freq: Every day | ORAL | Status: DC | PRN

## 2020-09-15 MED ORDER — SODIUM CHLORIDE 0.9% FLUSH
3.0000 mL | Freq: Two times a day (BID) | INTRAVENOUS | Status: DC
  Administered 2020-09-15 – 2020-09-17 (×5): 3 mL via INTRAVENOUS

## 2020-09-15 MED ORDER — INSULIN ASPART 100 UNIT/ML ~~LOC~~ SOLN
0.0000 [IU] | Freq: Three times a day (TID) | SUBCUTANEOUS | Status: DC
  Administered 2020-09-15: 3 [IU] via SUBCUTANEOUS
  Administered 2020-09-16: 2 [IU] via SUBCUTANEOUS
  Administered 2020-09-16 – 2020-09-17 (×2): 1 [IU] via SUBCUTANEOUS

## 2020-09-15 MED ORDER — GABAPENTIN 600 MG PO TABS
300.0000 mg | ORAL_TABLET | Freq: Three times a day (TID) | ORAL | Status: DC
  Filled 2020-09-15: qty 0.5
  Filled 2020-09-15: qty 1
  Filled 2020-09-15: qty 0.5

## 2020-09-15 MED ORDER — ONDANSETRON HCL 4 MG PO TABS: 4.0000 mg | ORAL_TABLET | Freq: Three times a day (TID) | ORAL | Status: DC | PRN

## 2020-09-15 MED ORDER — HYDRALAZINE HCL 25 MG PO TABS: 25.0000 mg | ORAL_TABLET | Freq: Four times a day (QID) | ORAL | Status: DC | PRN

## 2020-09-15 MED ORDER — VITAMIN B-12 1000 MCG PO TABS
5000.0000 ug | ORAL_TABLET | Freq: Every day | ORAL | Status: DC
  Administered 2020-09-16 – 2020-09-17 (×2): 5000 ug via ORAL
  Filled 2020-09-15 (×2): qty 5

## 2020-09-15 MED ORDER — CLONIDINE HCL 0.1 MG PO TABS
0.1000 mg | ORAL_TABLET | Freq: Three times a day (TID) | ORAL | Status: DC
  Filled 2020-09-15: qty 1

## 2020-09-15 MED ORDER — CARVEDILOL 25 MG PO TABS
25.0000 mg | ORAL_TABLET | Freq: Two times a day (BID) | ORAL | Status: DC
  Administered 2020-09-15 – 2020-09-17 (×4): 25 mg via ORAL
  Filled 2020-09-15 (×4): qty 1

## 2020-09-15 MED ORDER — INSULIN ASPART 100 UNIT/ML ~~LOC~~ SOLN: 0.0000 [IU] | Freq: Three times a day (TID) | SUBCUTANEOUS | Status: DC

## 2020-09-15 MED ORDER — CALCITRIOL 0.25 MCG PO CAPS
0.2500 ug | ORAL_CAPSULE | ORAL | Status: DC
  Administered 2020-09-16: 0.25 ug via ORAL
  Filled 2020-09-15: qty 1

## 2020-09-15 MED ORDER — ISOSORBIDE MONONITRATE ER 60 MG PO TB24
60.0000 mg | ORAL_TABLET | Freq: Every day | ORAL | Status: DC
  Administered 2020-09-16 – 2020-09-17 (×2): 60 mg via ORAL
  Filled 2020-09-15 (×2): qty 1

## 2020-09-15 MED ORDER — ROSUVASTATIN CALCIUM 20 MG PO TABS
20.0000 mg | ORAL_TABLET | Freq: Every day | ORAL | Status: DC
  Administered 2020-09-15 – 2020-09-17 (×3): 20 mg via ORAL
  Filled 2020-09-15 (×3): qty 1

## 2020-09-15 MED ORDER — EPOETIN ALFA-EPBX 10000 UNIT/ML IJ SOLN
INTRAMUSCULAR | Status: AC
  Administered 2020-09-15: 20000 [IU] via SUBCUTANEOUS
  Filled 2020-09-15: qty 2

## 2020-09-15 MED ORDER — DEXTROMETHORPHAN-GUAIFENESIN 10-200 MG PO CAPS: 2.0000 | ORAL_CAPSULE | Freq: Every day | ORAL | Status: DC | PRN

## 2020-09-15 MED ORDER — EPOETIN ALFA-EPBX 10000 UNIT/ML IJ SOLN: 20000.0000 [IU] | INTRAMUSCULAR | Status: DC

## 2020-09-15 MED ORDER — VITAMIN E 45 MG (100 UNIT) PO CAPS
1000.0000 [IU] | ORAL_CAPSULE | Freq: Every day | ORAL | Status: DC
  Administered 2020-09-15 – 2020-09-17 (×3): 1000 [IU] via ORAL
  Filled 2020-09-15 (×4): qty 10

## 2020-09-15 MED ORDER — FUROSEMIDE 80 MG PO TABS
80.0000 mg | ORAL_TABLET | Freq: Every day | ORAL | Status: DC
  Administered 2020-09-16 – 2020-09-17 (×2): 80 mg via ORAL
  Filled 2020-09-15 (×2): qty 1

## 2020-09-15 MED ORDER — LINACLOTIDE 145 MCG PO CAPS
145.0000 ug | ORAL_CAPSULE | Freq: Every day | ORAL | Status: DC | PRN
  Filled 2020-09-15: qty 1

## 2020-09-15 MED ORDER — MONTELUKAST SODIUM 10 MG PO TABS
10.0000 mg | ORAL_TABLET | Freq: Every day | ORAL | Status: DC
  Administered 2020-09-15 – 2020-09-16 (×2): 10 mg via ORAL
  Filled 2020-09-15 (×3): qty 1

## 2020-09-15 MED ORDER — OMEPRAZOLE MAGNESIUM 20 MG PO TBEC: 20.0000 mg | DELAYED_RELEASE_TABLET | Freq: Every day | ORAL | Status: DC

## 2020-09-15 MED ORDER — VITAMIN D 25 MCG (1000 UNIT) PO TABS
5000.0000 [IU] | ORAL_TABLET | Freq: Every day | ORAL | Status: DC
  Administered 2020-09-15 – 2020-09-17 (×3): 5000 [IU] via ORAL
  Filled 2020-09-15 (×3): qty 5

## 2020-09-15 MED ORDER — AMLODIPINE BESYLATE 10 MG PO TABS
10.0000 mg | ORAL_TABLET | Freq: Every day | ORAL | Status: DC
  Administered 2020-09-16 – 2020-09-17 (×2): 10 mg via ORAL
  Filled 2020-09-15 (×2): qty 1

## 2020-09-15 MED ORDER — TAMSULOSIN HCL 0.4 MG PO CAPS
0.4000 mg | ORAL_CAPSULE | Freq: Every day | ORAL | Status: DC
  Administered 2020-09-15 – 2020-09-16 (×2): 0.4 mg via ORAL
  Filled 2020-09-15 (×2): qty 1
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
## 2020-09-15 NOTE — Progress Notes (Signed)
Pt reported he fell in parking lot while getting out of his vehicle and hit his right knee. Right knee unremarkable, no abrasions or bleeding noted.  Pt instructed to go to emergency room for further evaluation, he refused.

## 2020-09-15 NOTE — ED Triage Notes (Signed)
Patient arrives to ED with complaints of mechanical fall while getting into his car this morning. Pt fell backwards, hitting the back of his head. Pt states brief LOC. Pt states that he's on elquis.
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
## 2020-09-15 NOTE — Care Management (Addendum)
09-15-20 1554 Case Manager received a consult regarding medications being costly for this patient.  Case Manager is unable to assist with co pays for Medicare patients. Case Manager did discuss with the patient that he may need a new Part D Plan that is affordable and he is aware that open enrollment ends on Dec 7th. We discussed that he could always reach out to the company for the Eliquis patient assistance. Patient asked Case manager to call his daughter Shelbie Hutching. Case Manager did call Aisya and discussed with her regarding the above information. She will assist with the patient assistance application and she is also checking to see if the patient has long term care insurance for personal care services. Patient will need PT/OT to evaluate him and make recommendations. Patient has used [facility] in the past and daughter would like to use them again. Case Manager will continue to follow for additional toc needs. Bethena Roys, RN,BSN Case Manager

## 2020-09-15 NOTE — Progress Notes (Signed)
  Echocardiogram 2D Echocardiogram has been performed.  William Webb 09/15/2020, 5:43 PM

## 2020-09-16 ENCOUNTER — Observation Stay (HOSPITAL_COMMUNITY): Payer: BC Managed Care – PPO

## 2020-09-16 ENCOUNTER — Inpatient Hospital Stay (HOSPITAL_COMMUNITY): Payer: BC Managed Care – PPO

## 2020-09-16 DIAGNOSIS — I361 Nonrheumatic tricuspid (valve) insufficiency: Secondary | ICD-10-CM | POA: Diagnosis present

## 2020-09-16 DIAGNOSIS — Z20822 Contact with and (suspected) exposure to covid-19: Secondary | ICD-10-CM | POA: Diagnosis present

## 2020-09-16 DIAGNOSIS — I130 Hypertensive heart and chronic kidney disease with heart failure and stage 1 through stage 4 chronic kidney disease, or unspecified chronic kidney disease: Secondary | ICD-10-CM | POA: Diagnosis present

## 2020-09-16 DIAGNOSIS — E1122 Type 2 diabetes mellitus with diabetic chronic kidney disease: Secondary | ICD-10-CM | POA: Diagnosis present

## 2020-09-16 DIAGNOSIS — N400 Enlarged prostate without lower urinary tract symptoms: Secondary | ICD-10-CM | POA: Diagnosis present

## 2020-09-16 DIAGNOSIS — M810 Age-related osteoporosis without current pathological fracture: Secondary | ICD-10-CM | POA: Diagnosis present

## 2020-09-16 DIAGNOSIS — Z96651 Presence of right artificial knee joint: Secondary | ICD-10-CM | POA: Diagnosis present

## 2020-09-16 DIAGNOSIS — M797 Fibromyalgia: Secondary | ICD-10-CM | POA: Diagnosis present

## 2020-09-16 DIAGNOSIS — I5032 Chronic diastolic (congestive) heart failure: Secondary | ICD-10-CM | POA: Diagnosis present

## 2020-09-16 DIAGNOSIS — N184 Chronic kidney disease, stage 4 (severe): Secondary | ICD-10-CM | POA: Diagnosis present

## 2020-09-16 DIAGNOSIS — R55 Syncope and collapse: Secondary | ICD-10-CM | POA: Diagnosis present

## 2020-09-16 DIAGNOSIS — R4020 Unspecified coma: Secondary | ICD-10-CM | POA: Diagnosis present

## 2020-09-16 DIAGNOSIS — E11319 Type 2 diabetes mellitus with unspecified diabetic retinopathy without macular edema: Secondary | ICD-10-CM | POA: Diagnosis present

## 2020-09-16 DIAGNOSIS — J918 Pleural effusion in other conditions classified elsewhere: Secondary | ICD-10-CM | POA: Diagnosis present

## 2020-09-16 DIAGNOSIS — I313 Pericardial effusion (noninflammatory): Secondary | ICD-10-CM | POA: Diagnosis present

## 2020-09-16 DIAGNOSIS — E1151 Type 2 diabetes mellitus with diabetic peripheral angiopathy without gangrene: Secondary | ICD-10-CM | POA: Diagnosis present

## 2020-09-16 DIAGNOSIS — I2510 Atherosclerotic heart disease of native coronary artery without angina pectoris: Secondary | ICD-10-CM | POA: Diagnosis present

## 2020-09-16 DIAGNOSIS — D631 Anemia in chronic kidney disease: Secondary | ICD-10-CM | POA: Diagnosis present

## 2020-09-16 DIAGNOSIS — E1136 Type 2 diabetes mellitus with diabetic cataract: Secondary | ICD-10-CM | POA: Diagnosis present

## 2020-09-16 DIAGNOSIS — E785 Hyperlipidemia, unspecified: Secondary | ICD-10-CM | POA: Diagnosis present

## 2020-09-16 DIAGNOSIS — I252 Old myocardial infarction: Secondary | ICD-10-CM | POA: Diagnosis not present

## 2020-09-16 DIAGNOSIS — I272 Pulmonary hypertension, unspecified: Secondary | ICD-10-CM | POA: Diagnosis present

## 2020-09-16 DIAGNOSIS — I480 Paroxysmal atrial fibrillation: Secondary | ICD-10-CM | POA: Diagnosis present

## 2020-09-16 DIAGNOSIS — K219 Gastro-esophageal reflux disease without esophagitis: Secondary | ICD-10-CM | POA: Diagnosis present

## 2020-09-16 DIAGNOSIS — E1142 Type 2 diabetes mellitus with diabetic polyneuropathy: Secondary | ICD-10-CM | POA: Diagnosis present

## 2020-09-16 HISTORY — PX: IR THORACENTESIS ASP PLEURAL SPACE W/IMG GUIDE: IMG5380

## 2020-09-16 LAB — BODY FLUID CELL COUNT WITH DIFFERENTIAL
Eos, Fluid: 0 %
Lymphs, Fluid: 41 %
Monocyte-Macrophage-Serous Fluid: 54 % (ref 50–90)
Neutrophil Count, Fluid: 5 % (ref 0–25)
Total Nucleated Cell Count, Fluid: 36 cu mm (ref 0–1000)

## 2020-09-16 LAB — GRAM STAIN

## 2020-09-16 LAB — BASIC METABOLIC PANEL
Anion gap: 6 (ref 5–15)
BUN: 36 mg/dL — ABNORMAL HIGH (ref 8–23)
CO2: 23 mmol/L (ref 22–32)
Calcium: 7.7 mg/dL — ABNORMAL LOW (ref 8.9–10.3)
Chloride: 108 mmol/L (ref 98–111)
Creatinine, Ser: 4.06 mg/dL — ABNORMAL HIGH (ref 0.61–1.24)
GFR, Estimated: 15 mL/min — ABNORMAL LOW (ref >60)
Glucose, Bld: 152 mg/dL — ABNORMAL HIGH (ref 70–99)
Potassium: 4.6 mmol/L (ref 3.5–5.1)
Sodium: 137 mmol/L (ref 135–145)

## 2020-09-16 LAB — GLUCOSE, CAPILLARY
Glucose-Capillary: 143 mg/dL — ABNORMAL HIGH (ref 70–99)
Glucose-Capillary: 166 mg/dL — ABNORMAL HIGH (ref 70–99)
Glucose-Capillary: 128 mg/dL — ABNORMAL HIGH (ref 70–99)
Glucose-Capillary: 171 mg/dL — ABNORMAL HIGH (ref 70–99)
Glucose-Capillary: 180 mg/dL — ABNORMAL HIGH (ref 70–99)

## 2020-09-16 LAB — PROTEIN, PLEURAL OR PERITONEAL FLUID: Total protein, fluid: <3 g/dL

## 2020-09-16 LAB — LACTATE DEHYDROGENASE, PLEURAL OR PERITONEAL FLUID: LD, Fluid: 40 U/L — ABNORMAL HIGH (ref 3–23)

## 2020-09-16 LAB — GLUCOSE, PLEURAL OR PERITONEAL FLUID: Glucose, Fluid: 159 mg/dL

## 2020-09-16 MED ORDER — VITAMIN E 45 MG (100 UNIT) PO CAPS: 1000.0000 [IU] | ORAL_CAPSULE | Freq: Every day | ORAL | Status: DC

## 2020-09-16 MED ORDER — APIXABAN 5 MG PO TABS
5.0000 mg | ORAL_TABLET | Freq: Two times a day (BID) | ORAL | Status: DC
  Administered 2020-09-17: 5 mg via ORAL
  Filled 2020-09-16: qty 1

## 2020-09-16 MED ORDER — LIDOCAINE HCL 1 % IJ SOLN
INTRAMUSCULAR | Status: AC
  Filled 2020-09-16: qty 20

## 2020-09-16 MED ORDER — LIDOCAINE HCL (PF) 1 % IJ SOLN
INTRAMUSCULAR | Status: DC | PRN
  Administered 2020-09-16: 10 mL

## 2020-09-16 NOTE — Evaluation (Signed)
Physical Therapy Evaluation Patient Details Name: [facility] MRN: [id number] DOB: 1951/10/25 Today's Date: 09/16/2020   History of Present Illness  Pt is 69 y.o. male with medical history significant of PAF on Eliquis, IDDM, diabetic neuropathy, chronic diastolic CHF, CKD stage IV, refractory HTN, PVD, presented with recurrent syncopes. Pt with mechanical fall while getting into his car this morning. Pt fell backwards, hitting the back of his head. Pt states brief LOC.   Clinical Impression  Pt participated in evaluation after syncopal episode causing mechanical fall posteriorly. Pt PLOF is I, but is now demonstrate deficits in balance and gait. Pt was limited in participation in session due to pain in neck and head from fall. Pt will benefit from continued PT to address deficits in balance and gait to maximize I with functional mobility prior to discharge. Recommend OPPT follow up    Follow Up Recommendations Outpatient PT    Equipment Recommendations  None recommended by PT    Recommendations for Other Services       Precautions / Restrictions Precautions Precautions: Fall Restrictions Weight Bearing Restrictions: No      Mobility  Bed Mobility Overal bed mobility: Modified Independent                  Transfers Overall transfer level: Needs assistance Equipment used: None Transfers: Sit to/from Stand Sit to Stand: Min guard            Ambulation/Gait Ambulation/Gait assistance: Min guard Gait Distance (Feet): 2 Feet Assistive device: None       General Gait Details: pt with shuffling gait pattern and reaching out for chair, min guard needed for balance  Stairs            Wheelchair Mobility    Modified Rankin (Stroke Patients Only)       Balance Overall balance assessment: Needs assistance Sitting-balance support: Feet supported Sitting balance-Leahy Scale: Good     Standing balance support: No upper extremity  supported Standing balance-Leahy Scale: Fair                               Pertinent Vitals/Pain Pain Assessment: Faces Faces Pain Scale: Hurts even more Pain Location: neck Pain Descriptors / Indicators: Guarding;Grimacing    Home Living Family/patient expects to be discharged to:: Private residence Living Arrangements: Alone Available Help at Discharge: Family;Available PRN/intermittently Type of Home: Apartment Home Access: Level entry     Home Layout: One level Home Equipment: Grab bars - tub/shower;Shower seat;Walker - 2 wheels;Cane - single point;Bedside commode      Prior Function Level of Independence: Independent         Comments: Reports independent with driving, adls, and IADLs.     Hand Dominance        Extremity/Trunk Assessment   Upper Extremity Assessment Upper Extremity Assessment: Overall WFL for tasks assessed    Lower Extremity Assessment Lower Extremity Assessment: Overall WFL for tasks assessed       Communication   Communication: No difficulties  Cognition Arousal/Alertness: Awake/alert Behavior During Therapy: WFL for tasks assessed/performed Overall Cognitive Status: Within Functional Limits for tasks assessed                                        General Comments General comments (skin integrity, edema, etc.): VSS    Exercises  Assessment/Plan    PT Assessment Patient needs continued PT services  PT Problem List Decreased balance       PT Treatment Interventions Balance training;Gait training;Patient/family education    PT Goals (Current goals can be found in the Care Plan section)  Acute Rehab PT Goals Patient Stated Goal: I want to go home PT Goal Formulation: With patient Time For Goal Achievement: 09/30/20 Potential to Achieve Goals: Good    Frequency Min 3X/week   Barriers to discharge        Co-evaluation               AM-PAC PT "6 Clicks" Mobility  Outcome  Measure Help needed turning from your back to your side while in a flat bed without using bedrails?: None Help needed moving from lying on your back to sitting on the side of a flat bed without using bedrails?: None Help needed moving to and from a bed to a chair (including a wheelchair)?: A Little Help needed standing up from a chair using your arms (e.g., wheelchair or bedside chair)?: A Little Help needed to walk in hospital room?: A Little Help needed climbing 3-5 steps with a railing? : A Little 6 Click Score: 20    End of Session Equipment Utilized During Treatment: Gait belt Activity Tolerance: Patient tolerated treatment well Patient left: in chair;with chair alarm set;with call bell/phone within reach Nurse Communication: Mobility status PT Visit Diagnosis: Unsteadiness on feet (R26.81)    Time: 3546-5681 PT Time Calculation (min) (ACUTE ONLY): 14 min   Charges:   PT Evaluation $PT Eval Low Complexity: 1 Low          Lyanne Co, DPT Acute Rehabilitation Services [phone]  [place] 09/16/2020, 12:39 PM

## 2020-09-16 NOTE — Procedures (Signed)
PROCEDURE SUMMARY:  Successful image-guided right thoracentesis. Yielded 1.6 liters of clear, light yellow fluid. Patient tolerated procedure well. EBL: Zero No immediate complications.  Specimen was sent for labs. Post procedure CXR shows no pneumothorax.  Please see imaging section of Epic for full dictation.  Joaquim Nam PA-C 09/16/2020 2:02 PM
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
## 2020-09-16 NOTE — Progress Notes (Signed)
Nutrition Brief Note  Patient identified on the Malnutrition Screening Tool (MST) Report. Usual weights reviewed. No significant weight changes noted over the past year.   Patient reports that he is unsure if he has lost weight, maybe a few pounds. He has a good appetite and has been eating 100% of all meals. Currently on a renal diet.   Body mass index is 25.12 kg/m. Patient meets criteria for normal weight based on current BMI.   Labs and medications reviewed.   RD attached "Heart Healthy Carbohydrate Consistent Nutrition Therapy" handout from the Academy of [employer] to d/c instructions.  No further nutrition interventions warranted at this time. If nutrition issues arise, please consult RD.   Lucas Mallow, RD, LDN, CNSC Please refer to [facility] for contact information.

## 2020-09-16 NOTE — Consult Note (Signed)
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 to 60%. The  left ventricle has normal function. The left ventricle has no regional  wall motion abnormalities. There is severe left ventricular hypertrophy.  Left ventricular diastolic parameters  are consistent with Grade II diastolic dysfunction (pseudonormalization).  Elevated left atrial pressure.  2. Right ventricular systolic function is normal. The right ventricular  size is normal. There is moderately elevated pulmonary artery systolic  pressure at 47 mmHg  3. Left atrial size was severely dilated.  4. Right atrial size was mildly dilated.  5. A small pericardial effusion is present. The pericardial effusion is  posterior to the left ventricle.  6. Tricuspid valve regurgitation is moderate to severe.  7. The aortic valve is tricuspid. There is mild calcification of the  aortic valve. Aortic valve regurgitation is not visualized. Mild aortic  valve stenosis. Aortic valve area, by VTI measures 1.31 cm. Aortic  valve  mean gradient measures 11.3 mmHg.  Aortic valve Vmax measures 2.28 m/s.  8. Compared to previous study in 2019, tricuspid regurgitation has  increased from trace. Small pericardial effusion is new.     Assessment & Recommendations:  69 y.o. African American male with hypertension, hyperlipidemia, type 2 DM, CAD, PAD, advanced CKD stage, h/o cardioversion for persistent Afib, admitted with fall.   Fall: Likely multifactorial, combination of vertigo, neuropathy. Cardiac telemetry shows no arrhythmia. He does have new pulmonary hypertension which could cause syncope in severe cases, but he likely lost consciousness after the fall, rather than syncope leading to his fall. Will consider outpatient workup for PH. Agree with holding clonidine in the setting of recurrent falls.   Pleural and pericardial effusion: Likely due to combination of HFpEF and advanced CKD. Continue home dose diuretics.   Atrial fibrillation: Persistent, now in sinus rhythm s/p cardioversion 09/29/2019. CHA2DS2VASc score 4, annual stroke risk 5%. Currently on eliquis 5 mg bid. He is concerned about the cost. May consider switching to outpatient, if he does not qualify for patient assistance through manufacturer.   Will arrange outpatient follow up.    Nigel Mormon, MD Pager: [phone] Office: [phone]

## 2020-09-16 NOTE — Discharge Instructions (Signed)
Heart Healthy, Consistent Carbohydrate Nutrition Therapy   A heart-healthy and consistent carbohydrate diet is recommended to manage heart disease and diabetes. To follow a heart-healthy and consistent carbohydrate diet, . Eat a balanced diet with whole grains, fruits and vegetables, and lean protein sources.  . Choose heart-healthy unsaturated fats. Limit saturated fats, trans fats, and cholesterol intake. Eat more plant-based or vegetarian meals using beans and soy foods for protein.  . Eat whole, unprocessed foods to limit the amount of sodium (salt) you eat.  . Choose a consistent amount of carbohydrate at each meal and snack. Limit refined carbohydrates especially sugar, sweets and sugar-sweetened beverages.  . If you drink alcohol, do so in moderation: one serving per day (women) and two servings per day (men). o One serving is equivalent to 12 ounces beer, 5 ounces wine, or 1.5 ounces distilled spirits  Tips Tips for Choosing Heart-Healthy Fats Choose lean protein and low-fat dairy foods to reduce saturated fat intake. . Saturated fat is usually found in animal-based protein and is associated with certain health risks. Saturated fat is the biggest contributor to raise low-density lipoprotein (LDL) cholesterol levels. Research shows that limiting saturated fat lowers unhealthy cholesterol levels. Eat no more than 7% of your total calories each day from saturated fat. Ask your RDN to help you determine how much saturated fat is right for you. . There are many foods that do not contain large amounts of saturated fats. Swapping these foods to replace foods high in saturated fats will help you limit the saturated fat you eat and improve your cholesterol levels. You can also try eating more plant-based or vegetarian meals. Instead of. Try:  Whole milk, cheese, yogurt, and ice cream 1% or skim milk, low-fat cheese, non-fat yogurt, and low-fat ice cream  Fatty, marbled beef and pork Lean beef, pork,  or venison  Poultry with skin Poultry without skin  Butter, stick margarine Reduced-fat, whipped, or liquid spreads  Coconut oil, palm oil Liquid vegetable oils: corn, canola, olive, soybean and safflower oils   Avoid foods that contain trans fats. . Trans fats increase levels of LDL-cholesterol. Hydrogenated fat in processed foods is the main source of trans fats in foods.  . Trans fats can be found in stick margarine, shortening, processed sweets, baked goods, some fried foods, and packaged foods made with hydrogenated oils. Avoid foods with "partially hydrogenated oil" on the ingredient list such as: cookies, pastries, baked goods, biscuits, crackers, microwave popcorn, and frozen dinners. Choose foods with heart healthy fats. . Polyunsaturated and monounsaturated fat are unsaturated fats that may help lower your blood cholesterol level when used in place of saturated fat in your diet. . Ask your RDN about taking a dietary supplement with plant sterols and stanols to help lower your cholesterol level. Marland Kitchen Research shows that substituting saturated fats with unsaturated fats is beneficial to cholesterol levels. Try these easy swaps: Instead of. Try:  Butter, stick margarine, or solid shortening Reduced-fat, whipped, or liquid spreads  Beef, pork, or poultry with skin Fish and seafood  Chips, crackers, snack foods Raw or unsalted nuts and seeds or nut butters Hummus with vegetables Avocado on toast  Coconut oil, palm oil Liquid vegetable oils: corn, canola, olive, soybean and safflower oils  Limit the amount of cholesterol you eat to less than 200 milligrams per day. . Cholesterol is a substance carried through the bloodstream via lipoproteins, which are known as "transporters" of fat. Some body functions need cholesterol to work properly, but too much  cholesterol in the bloodstream can damage arteries and build up blood vessel linings (which can lead to heart attack and stroke). You should eat  less than 200 milligrams cholesterol per day. Marland Kitchen People respond differently to eating cholesterol. There is no test available right now that can figure out which people will respond more to dietary cholesterol and which will respond less. For individuals with high intake of dietary cholesterol, different types of increase (none, small, moderate, large) in LDL-cholesterol levels are all possible.  . Food sources of cholesterol include egg yolks and organ meats such as liver, gizzards. Limit egg yolks to two to four per week and avoid organ meats like liver and gizzards to control cholesterol intake. Tips for Choosing Heart-Healthy Carbohydrates Consume a consistent amount of carbohydrate . It is important to eat foods with carbohydrates in moderation because they impact your blood glucose level. Carbohydrates can be found in many foods such as: . Grains (breads, crackers, rice, pasta, and cereals)  . Starchy Vegetables (potatoes, corn, and peas)  . Beans and legumes  . Milk, soy milk, and yogurt  . Fruit and fruit juice  . Sweets (cakes, cookies, ice cream, jam and jelly) . Your RDN will help you set a goal for how many carbohydrate servings to eat at your meals and snacks. For many adults, eating 3 to 5 servings of carbohydrate foods at each meal and 1 or 2 carbohydrate servings for each snack works well.  . Check your blood glucose level regularly. It can tell you if you need to adjust when you eat carbohydrates. . Choose foods rich in viscous (soluble) fiber . Viscous, or soluble, is found in the walls of plant cells. Viscous fiber is found only in plant-based foods. Eating foods with fiber helps to lower your unhealthy cholesterol and keep your blood glucose in range  . Rich sources of viscous fiber include vegetables (asparagus, Brussels sprouts, sweet potatoes, turnips) fruit (apricots, mangoes, oranges), legumes, and whole grains (barley, oats, and oat bran).  . As you increase your fiber  intake gradually, also increase the amount of water you drink. This will help prevent constipation.  . If you have difficulty achieving this goal, ask your RDN about fiber laxatives. Choose fiber supplements made with viscous fibers such as psyllium seed husks or methylcellulose to help lower unhealthy cholesterol.  . Limit refined carbohydrates  . There are three types of carbohydrates: starches, sugar, and fiber. Some carbohydrates occur naturally in food, like the starches in rice or corn or the sugars in fruits and milk. Refined carbohydrates--foods with high amounts of simple sugars--can raise triglyceride levels. High triglyceride levels are associated with coronary heart disease. . Some examples of refined carbohydrate foods are table sugar, sweets, and beverages sweetened with added sugar. Tips for Reducing Sodium (Salt) Although sodium is important for your body to function, too much sodium can be harmful for people with high blood pressure. As sodium and fluid buildup in your tissues and bloodstream, your blood pressure increases. High blood pressure may cause damage to other organs and increase your risk for a stroke. Even if you take a pill for blood pressure or a water pill (diuretic) to remove fluid, it is still important to have less salt in your diet. Ask your doctor and RDN what amount of sodium is right for you. Marland Kitchen Avoid processed foods. Eat more fresh foods.  . Fresh fruits and vegetables are naturally low in sodium, as well as frozen vegetables and fruits that have  no added juices or sauces.  . Fresh meats are lower in sodium than processed meats, such as bacon, sausage, and hotdogs. Read the nutrition label or ask your butcher to help you find a fresh meat that is low in sodium. . Eat less salt--at the table and when cooking.  . A single teaspoon of table salt has 2,300 mg of sodium.  . Leave the salt out of recipes for pasta, casseroles, and soups.  . Ask your RDN how to cook your  favorite recipes without sodium . Be a Paramedic.  . Look for food packages that say "salt-free" or "sodium-free." These items contain less than 5 milligrams of sodium per serving.  Marland Kitchen "Very low-sodium" products contain less than 35 milligrams of sodium per serving.  Marland Kitchen "Low-sodium" products contain less than 140 milligrams of sodium per serving.  . Beware for "Unsalted" or "No Added Salt" products. These items may still be high in sodium. Check the nutrition label. . Add flavors to your food without adding sodium.  . Try lemon juice, lime juice, fruit juice or vinegar.  . Dry or fresh herbs add flavor. Try basil, bay leaf, dill, rosemary, parsley, sage, dry mustard, nutmeg, thyme, and paprika.  . Pepper, red pepper flakes, and cayenne pepper can add spice t your meals without adding sodium. Hot sauce contains sodium, but if you use just a drop or two, it will not add up to much.  Sharyn Lull a sodium-free seasoning blend or make your own at home. Additional Lifestyle Tips Achieve and maintain a healthy weight. . Talk with your RDN or your doctor about what is a healthy weight for you. . Set goals to reach and maintain that weight.  . To lose weight, reduce your calorie intake along with increasing your physical activity. A weight loss of 10 to 15 pounds could reduce LDL-cholesterol by 5 milligrams per deciliter. Participate in physical activity. . Talk with your health care team to find out what types of physical activity are best for you. Set a plan to get about 30 minutes of exercise on most days.  Foods Recommended Food Group Foods Recommended  Grains Whole grain breads and cereals, including whole wheat, barley, rye, buckwheat, corn, teff, quinoa, millet, amaranth, brown or wild rice, sorghum, and oats Pasta, especially whole wheat or other whole grain types  [employer], quinoa or wild rice Whole grain crackers, bread, rolls, pitas Home-made bread with reduced-sodium baking soda  Protein  Foods Lean cuts of beef and pork (loin, leg, round, extra lean hamburger)  Skinless Cytogeneticist and other wild game Dried beans and peas Nuts and nut butters Meat alternatives made with soy or textured vegetable protein  Egg whites or egg substitute Cold cuts made with lean meat or soy protein  Dairy Nonfat (skim), low-fat, or 1%-fat milk  Nonfat or low-fat yogurt or cottage cheese Fat-free and low-fat cheese  Vegetables Fresh, frozen, or canned vegetables without added fat or salt   Fruits Fresh, frozen, canned, or dried fruit   Oils Unsaturated oils (corn, olive, peanut, soy, sunflower, canola)  Soft or liquid margarines and vegetable oil spreads  Salad dressings Seeds and nuts  Avocado   Foods Not Recommended Food Group Foods Not Recommended  Grains Breads or crackers topped with salt Cereals (hot or cold) with more than 300 mg sodium per serving Biscuits, cornbread, and other "quick" breads prepared with baking soda Bread crumbs or stuffing mix from a store High-fat bakery products, such as  doughnuts, biscuits, croissants, danish pastries, pies, cookies Instant cooking foods to which you add hot water and stir--potatoes, noodles, rice, etc. Packaged starchy foods--seasoned noodle or rice dishes, stuffing mix, macaroni and cheese dinner Snacks made with partially hydrogenated oils, including chips, cheese puffs, snack mixes, regular crackers, butter-flavored popcorn  Protein Foods Higher-fat cuts of meats (ribs, t-bone steak, regular hamburger) Bacon, sausage, or hot dogs Cold cuts, such as salami or bologna, deli meats, cured meats, corned beef Organ meats (liver, brains, gizzards, sweetbreads) Poultry with skin Fried or smoked meat, poultry, and fish Whole eggs and egg yolks (more than 2-4 per week) Salted legumes, nuts, seeds, or nut/seed butters Meat alternatives with high levels of sodium (>300 mg per serving) or saturated fat (>5 g per serving)  Dairy Whole  milk,?2% fat milk, buttermilk Whole milk yogurt or ice cream Cream Half-&-half Cream cheese Sour cream Cheese  Vegetables Canned or frozen vegetables with salt, fresh vegetables prepared with salt, butter, cheese, or cream sauce Fried vegetables Pickled vegetables such as olives, pickles, or sauerkraut  Fruits Fried fruits Fruits served with butter or cream  Oils Butter, stick margarine, shortening Partially hydrogenated oils or trans fats Tropical oils (coconut, palm, palm kernel oils)  Other Candy, sugar sweetened soft drinks and desserts Salt, sea salt, garlic salt, and seasoning mixes containing salt Bouillon cubes Ketchup, barbecue sauce, Worcestershire sauce, soy sauce, teriyaki sauce Miso Salsa Pickles, olives, relish   Heart Healthy Consistent Carbohydrate Vegetarian (Lacto-Ovo) Sample 1-Day Menu  Breakfast 1 cup oatmeal, cooked (2 carbohydrate servings)   cup blueberries (1 carbohydrate serving)  11 almonds, without salt  1 cup 1% milk (1 carbohydrate serving)  1 cup coffee  Morning Snack 1 cup fat-free plain yogurt (1 carbohydrate serving)  Lunch 1 whole wheat bun (1 carbohydrate servings)  1 black bean burger (1 carbohydrate servings)  1 slice cheddar cheese, low sodium  2 slices tomatoes  2 leaves lettuce  1 teaspoon mustard  1 small pear (1 carbohydrate servings)  1 cup green tea, unsweetened  Afternoon Snack 1/3 cup trail mix with nuts, seeds, and raisins, without salt (1 carbohydrate servinga)  Evening Meal  cup meatless chicken  2/3 cup brown rice, cooked (2 carbohydrate servings)  1 cup broccoli, cooked (2/3 carbohydrate serving)   cup carrots, cooked (1/3 carbohydrate serving)  2 teaspoons olive oil  1 teaspoon balsamic vinegar  1 whole wheat dinner roll (1 carbohydrate serving)  1 teaspoon margarine, soft, tub  1 cup 1% milk (1 carbohydrate serving)  Evening Snack 1 extra small banana (1 carbohydrate serving)  1 tablespoon peanut butter    Heart Healthy Consistent Carbohydrate Vegan Sample 1-Day Menu  Breakfast 1 cup oatmeal, cooked (2 carbohydrate servings)   cup blueberries (1 carbohydrate serving)  11 almonds, without salt  1 cup soymilk fortified with calcium, vitamin B12, and vitamin D  1 cup coffee  Morning Snack 6 ounces soy yogurt (1 carbohydrate servings)  Lunch 1 whole wheat bun(1 carbohydrate servings)  1 black bean burger (1 carbohydrate serving)  2 slices tomatoes  2 leaves lettuce  1 teaspoon mustard  1 small pear (1 carbohydrate servings)  1 cup green tea, unsweetened  Afternoon Snack 1/3 cup trail mix with nuts, seeds, and raisins, without salt (1 carbohydrate servings)  Evening Meal  cup meatless chicken  2/3 cup brown rice, cooked (2 carbohydrate servings)  1 cup broccoli, cooked (2/3 carbohydrate serving)   cup carrots, cooked (1/3 carbohydrate serving)  2 teaspoons olive oil  1  teaspoon balsamic vinegar  1 whole wheat dinner roll (1 carbohydrate serving)  1 teaspoon margarine, soft, tub  1 cup soymilk fortified with calcium, vitamin B12, and vitamin D  Evening Snack 1 extra small banana (1 carbohydrate serving)  1 tablespoon peanut butter    Heart Healthy Consistent Carbohydrate Sample 1-Day Menu  Breakfast 1 cup cooked oatmeal (2 carbohydrate servings)  3/4 cup blueberries (1 carbohydrate serving)  1 ounce almonds  1 cup skim milk (1 carbohydrate serving)  1 cup coffee  Morning Snack 1 cup sugar-free nonfat yogurt (1 carbohydrate serving)  Lunch 2 slices whole-wheat bread (2 carbohydrate servings)  2 ounces lean [place] breast  1 ounce low-fat Swiss cheese  1 teaspoon mustard  1 slice tomato  1 lettuce leaf  1 small pear (1 carbohydrate serving)  1 cup skim milk (1 carbohydrate serving)  Afternoon Snack 1 ounce trail mix with unsalted nuts, seeds, and raisins (1 carbohydrate serving)  Evening Meal 3 ounces salmon  2/3 cup cooked brown rice (2 carbohydrate servings)  1 teaspoon  soft margarine  1 cup cooked broccoli with 1/2 cup cooked carrots (1 carbohydrate serving  Carrots, cooked, boiled, drained, without salt  1 cup lettuce  1 teaspoon olive oil with vinegar for dressing  1 small whole grain roll (1 carbohydrate serving)  1 teaspoon soft margarine  1 cup unsweetened tea  Evening Snack 1 extra-small banana (1 carbohydrate serving)  Copyright 2020  [employer]. All rights reserved.  Information on my medicine - ELIQUIS (apixaban)  Why was Eliquis prescribed for you? Eliquis was prescribed for you to reduce the risk of a blood clot forming that can cause a stroke if you have a medical condition called atrial fibrillation (a type of irregular heartbeat).  What do You need to know about Eliquis ? Take your Eliquis TWICE DAILY - one tablet in the morning and one tablet in the evening with or without food. If you have difficulty swallowing the tablet whole please discuss with your pharmacist how to take the medication safely.  Take Eliquis exactly as prescribed by your doctor and DO NOT stop taking Eliquis without talking to the doctor who prescribed the medication.  Stopping may increase your risk of developing a stroke.  Refill your prescription before you run out.  After discharge, you should have regular check-up appointments with your healthcare provider that is prescribing your Eliquis.  In the future your dose may need to be changed if your kidney function or weight changes by a significant amount or as you get older.  What do you do if you miss a dose? If you miss a dose, take it as soon as you remember on the same day and resume taking twice daily.  Do not take more than one dose of ELIQUIS at the same time to make up a missed dose.  Important Safety Information A possible side effect of Eliquis is bleeding. You should call your healthcare provider right away if you experience any of the following: ? Bleeding from an injury  or your nose that does not stop. ? Unusual colored urine (red or dark brown) or unusual colored stools (red or black). ? Unusual bruising for unknown reasons. ? A serious fall or if you hit your head (even if there is no bleeding).  Some medicines may interact with Eliquis and might increase your risk of bleeding or clotting while on Eliquis. To help avoid this, consult your healthcare provider or pharmacist prior to  using any new prescription or non-prescription medications, including herbals, vitamins, non-steroidal anti-inflammatory drugs (NSAIDs) and supplements.  This website has more information on Eliquis (apixaban): http://www.eliquis.com/eliquis/home

## 2020-09-16 NOTE — Plan of Care (Signed)
  Problem: Education: Goal: Knowledge of General Education information will improve Description: Including pain rating scale, medication(s)/side effects and non-pharmacologic comfort measures Outcome: Progressing   Problem: Clinical Measurements: Goal: Will remain free from infection Outcome: Progressing Goal: Diagnostic test results will improve Outcome: Progressing Goal: Respiratory complications will improve Outcome: Progressing   

## 2020-09-17 ENCOUNTER — Inpatient Hospital Stay (HOSPITAL_COMMUNITY): Payer: BC Managed Care – PPO

## 2020-09-17 DIAGNOSIS — R55 Syncope and collapse: Secondary | ICD-10-CM | POA: Diagnosis not present

## 2020-09-17 LAB — BASIC METABOLIC PANEL
Anion gap: 8 (ref 5–15)
BUN: 35 mg/dL — ABNORMAL HIGH (ref 8–23)
CO2: 23 mmol/L (ref 22–32)
Calcium: 7.9 mg/dL — ABNORMAL LOW (ref 8.9–10.3)
Chloride: 106 mmol/L (ref 98–111)
Creatinine, Ser: 4.03 mg/dL — ABNORMAL HIGH (ref 0.61–1.24)
GFR, Estimated: 15 mL/min — ABNORMAL LOW (ref >60)
Glucose, Bld: 133 mg/dL — ABNORMAL HIGH (ref 70–99)
Potassium: 4.2 mmol/L (ref 3.5–5.1)
Sodium: 137 mmol/L (ref 135–145)

## 2020-09-17 LAB — D-DIMER, QUANTITATIVE: D-Dimer, Quant: 1.44 ug/mL-FEU — ABNORMAL HIGH (ref 0.00–0.50)

## 2020-09-17 LAB — GLUCOSE, CAPILLARY
Glucose-Capillary: 110 mg/dL — ABNORMAL HIGH (ref 70–99)
Glucose-Capillary: 134 mg/dL — ABNORMAL HIGH (ref 70–99)
Glucose-Capillary: 185 mg/dL — ABNORMAL HIGH (ref 70–99)

## 2020-09-17 MED ORDER — TECHNETIUM TO 99M ALBUMIN AGGREGATED
3.9500 | Freq: Once | INTRAVENOUS | Status: AC | PRN
  Administered 2020-09-17: 3.95 via INTRAVENOUS

## 2020-09-17 NOTE — Plan of Care (Signed)
  Problem: Clinical Measurements: Goal: Ability to maintain clinical measurements within normal limits will improve Outcome: Progressing Goal: Respiratory complications will improve Outcome: Progressing Goal: Cardiovascular complication will be avoided Outcome: Progressing   

## 2020-09-18 NOTE — Discharge Summary (Signed)
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 section 360bbb-3(b)(1), unless the authorization is terminated or revoked sooner.     Influenza A by PCR NEGATIVE NEGATIVE Final   Influenza B by PCR NEGATIVE NEGATIVE Final    Comment: (NOTE) The Xpert Xpress SARS-CoV-2/FLU/RSV assay is intended as an aid in  the diagnosis of influenza from Nasopharyngeal swab specimens and  should not be used as a sole basis for treatment. Nasal washings and  aspirates are unacceptable for Xpert Xpress SARS-CoV-2/FLU/RSV  testing.  Fact Sheet for Patients: PinkCheek.be  Fact Sheet for Healthcare Providers: GravelBags.it  This test is not yet approved or cleared by the [place] FDA and  has been authorized for detection and/or diagnosis of SARS-CoV-2 by  FDA under an Emergency Use Authorization (EUA). This EUA will remain  in  effect (meaning this test can be used) for the duration of the  Covid-19 declaration under Section 564(b)(1) of the Act, 21  U.S.C. section 360bbb-3(b)(1), unless the authorization is  terminated or revoked. Performed at [facility], [place] [address] [place], [place] [address]   Gram stain     Status: None   Collection Time: 09/16/20  1:51 PM   Specimen: Lung, Right; Pleural Fluid  Result Value Ref Range Status   Specimen Description PLEURAL FLUID RIGHT LUNG  Final   Special Requests NONE  Final   Gram Stain   Final    WBC PRESENT, PREDOMINANTLY MONONUCLEAR NO ORGANISMS SEEN CYTOSPIN SMEAR Performed at [facility], [place] [address], [place], [place] [address]    Report Status 09/16/2020 FINAL  Final  Culture, body fluid-bottle     Status: None (Preliminary result)   Collection Time: 09/16/20  1:51 PM   Specimen: Pleura  Result Value Ref Range Status   Specimen Description PLEURAL FLUID RIGHT LUNG  Final   Special Requests NONE  Final   Culture   Final    NO GROWTH < 24 HOURS Performed at [facility], [place] [address], [place], [place] [address]    Report Status PENDING  Incomplete     Labs: BNP (last 3 results) Recent Labs    07/31/20 1739  BNP 3,149.7*   Basic Metabolic Panel: Recent Labs  Lab 09/15/20 0956 09/16/20 0248 09/17/20 0223  NA 137 137 137  K 4.8 4.6 4.2  CL 108 108 106  CO2 _0 GLUCOSE 130* 152* 133*  BUN 33* 36* 35*  CREATININE 4.08* 4.06* 4.03*  CALCIUM 8.0* 7.7* 7.9*   Liver Function Tests: No results for input(s): AST,  ALT, ALKPHOS, BILITOT, PROT, ALBUMIN in the last 168 hours. No results for input(s): LIPASE, AMYLASE in the last 168 hours. No results for input(s): AMMONIA in the last 168 hours. CBC: Recent Labs  Lab 09/15/20 0915 09/15/20 0956  WBC  --  3.7*  NEUTROABS  --  2.8  HGB 9.7* 9.3*  HCT  --  29.0*  MCV  --  91.8  PLT  --  173   Cardiac Enzymes: No results for input(s): CKTOTAL, CKMB, CKMBINDEX,  TROPONINI in the last 168 hours. BNP: Invalid input(s): POCBNP CBG: Recent Labs  Lab 09/16/20 1610 09/16/20 2128 09/17/20 0755 09/17/20 1128 09/17/20 1640  GLUCAP 171* 180* 110* 134* 185*   D-Dimer Recent Labs    09/17/20 0812  DDIMER 1.44*   Hgb A1c No results for input(s): HGBA1C in the last 72 hours. Lipid Profile No results for input(s): CHOL, HDL, LDLCALC, TRIG, CHOLHDL, LDLDIRECT in the last 72 hours. Thyroid function studies No results for input(s): TSH, T4TOTAL, T3FREE, THYROIDAB in the last 72 hours.  Invalid input(s): FREET3 Anemia work up No results for input(s): VITAMINB12, FOLATE, FERRITIN, TIBC, IRON, RETICCTPCT in the last 72 hours. Urinalysis    Component Value Date/Time   COLORURINE YELLOW 12/10/2018 1353   APPEARANCEUR HAZY (A) 12/10/2018 1353   LABSPEC 1.013 12/10/2018 1353   PHURINE 5.0 12/10/2018 1353   GLUCOSEU 50 (A) 12/10/2018 1353   HGBUR NEGATIVE 12/10/2018 1353   BILIRUBINUR NEGATIVE 12/10/2018 1353   KETONESUR NEGATIVE 12/10/2018 1353   PROTEINUR 100 (A) 12/10/2018 1353   NITRITE NEGATIVE 12/10/2018 1353   LEUKOCYTESUR NEGATIVE 12/10/2018 1353   Sepsis Labs Invalid input(s): PROCALCITONIN,  WBC,  LACTICIDVEN Microbiology Recent Results (from the past 240 hour(s))  Respiratory Panel by RT PCR (Flu A&B, Covid) - Nasopharyngeal Swab     Status: None   Collection Time: 09/15/20 12:37 PM   Specimen: Nasopharyngeal Swab; Nasopharyngeal(NP) swabs in vial transport medium  Result Value Ref Range Status   SARS Coronavirus 2 by RT PCR NEGATIVE NEGATIVE Final    Comment: (NOTE) SARS-CoV-2 target nucleic acids are NOT DETECTED.  The SARS-CoV-2 RNA is generally detectable in upper respiratoy specimens during the acute phase of infection. The lowest concentration of SARS-CoV-2 viral copies this assay can detect is 131 copies/mL. A negative result does not preclude SARS-Cov-2 infection and should not be used as the sole basis for treatment  or other patient management decisions. A negative result may occur with  improper specimen collection/handling, submission of specimen other than nasopharyngeal swab, presence of viral mutation(s) within the areas targeted by this assay, and inadequate number of viral copies (<131 copies/mL). A negative result must be combined with clinical observations, patient history, and epidemiological information. The expected result is Negative.  Fact Sheet for Patients:  PinkCheek.be  Fact Sheet for Healthcare Providers:  GravelBags.it  This test is no t yet approved or cleared by the [place] FDA and  has been authorized for detection and/or diagnosis of SARS-CoV-2 by FDA under an Emergency Use Authorization (EUA). This EUA will remain  in effect (meaning this test can be used) for the duration of the COVID-19 declaration under Section 564(b)(1) of the Act, 21 U.S.C. section 360bbb-3(b)(1), unless the authorization is terminated or revoked sooner.     Influenza A by PCR NEGATIVE NEGATIVE Final   Influenza B by PCR NEGATIVE NEGATIVE Final    Comment: (NOTE) The Xpert Xpress SARS-CoV-2/FLU/RSV assay is intended as an aid in  the diagnosis of influenza from  Nasopharyngeal swab specimens and  should not be used as a sole basis for treatment. Nasal washings and  aspirates are unacceptable for Xpert Xpress SARS-CoV-2/FLU/RSV  testing.  Fact Sheet for Patients: PinkCheek.be  Fact Sheet for Healthcare Providers: GravelBags.it  This test is not yet approved or cleared by the [place] FDA and  has been authorized for detection and/or diagnosis of SARS-CoV-2 by  FDA under an Emergency Use Authorization (EUA). This EUA will remain  in effect (meaning this test can be used) for the duration of the  Covid-19 declaration under Section 564(b)(1) of the Act, 21  U.S.C.  section 360bbb-3(b)(1), unless the authorization is  terminated or revoked. Performed at [facility], [place] [address], [place], [place] [address]   Gram stain     Status: None   Collection Time: 09/16/20  1:51 PM   Specimen: Lung, Right; Pleural Fluid  Result Value Ref Range Status   Specimen Description PLEURAL FLUID RIGHT LUNG  Final   Special Requests NONE  Final   Gram Stain   Final    WBC PRESENT, PREDOMINANTLY MONONUCLEAR NO ORGANISMS SEEN CYTOSPIN SMEAR Performed at [facility], [place] [address] [place], [place] [address]    Report Status 09/16/2020 FINAL  Final  Culture, body fluid-bottle     Status: None (Preliminary result)   Collection Time: 09/16/20  1:51 PM   Specimen: Pleura  Result Value Ref Range Status   Specimen Description PLEURAL FLUID RIGHT LUNG  Final   Special Requests NONE  Final   Culture   Final    NO GROWTH < 24 HOURS Performed at [facility], [place] [address], [place], [place] [address]    Report Status PENDING  Incomplete     Patient was seen and examined on the day of discharge and was found to be in stable condition. Time coordinating discharge: 25 minutes including assessment and coordination of care, as well as examination of the patient.   SIGNED:  Dessa Phi, DO [facility] 09/18/2020, 8:03 AM

## 2020-09-19 ENCOUNTER — Encounter: Payer: Self-pay | Admitting: Cardiology

## 2020-09-19 ENCOUNTER — Ambulatory Visit: Payer: BC Managed Care – PPO | Admitting: Cardiology

## 2020-09-19 ENCOUNTER — Other Ambulatory Visit: Payer: Self-pay

## 2020-09-19 ENCOUNTER — Ambulatory Visit: Payer: BC Managed Care – PPO

## 2020-09-19 VITALS — BP 129/62 | HR 66 | Resp 16 | Ht 75.0 in | Wt 188.0 lb

## 2020-09-19 DIAGNOSIS — W19XXXD Unspecified fall, subsequent encounter: Secondary | ICD-10-CM

## 2020-09-19 DIAGNOSIS — R4020 Unspecified coma: Secondary | ICD-10-CM

## 2020-09-19 LAB — PATHOLOGIST SMEAR REVIEW

## 2020-09-19 NOTE — Progress Notes (Signed)
Patient is here for follow up visit.  Subjective:   William Webb [facility], male    DOB: 02/08/1951, 69 y.o.   MRN: [id number]   Chief Complaint  Patient presents with  . Fall   HPI  69 y.o.African American male with hypertension, hyperlipidemia, type 2 DM, CAD, PAD, advanced CKD stage, h/o cardioversion for persistent Afib, fall (08/2020), small pericardial effusion, pulmonary hypertension   Patient was hospitalized in 08/2020, my consult HPI below.  Patient has experienced worsening dyspnea with minimal exertion caused few days.  He was in a hurry to drive himself to nephrology office.  He felt sudden weakness and blurry vision.  He tried to grab the car door handle, but lost balance and fell backwards and hit his head. He lost consciousness after falling.  He reportedly lost consciousness for less than a minute.  He was able to regain consciousness and drive himself to the ED. He denies any preceding chest pain or shortness of breath, palpitations, vision changes. While in the hospital, he was found to have bilateral pleural effusions, for which he underwent thoracentesis.  Echocardiogram showed severe LVH, moderate to severe TR, moderate pulmonary hypertension, small pericardial effusion, mod PH. Fortunately, he did not sustain any major injuries.  Patient had thoracentesis with improvement in pleural effusion., Echocardiogram also showed mild pericardial effusion. He has felt better since hospital discharge, but still has occasional orthopnea. He has not had any recurrent fall/syncope.   He has had episodes of forgetfulness with his medications. He has felt generalized weakness, limiting his ability to perform certain ADL's. He contemplates changes and adjustments in his life with regards to the above.   On a separate note, he states that eliquis is very expensive.   Current Outpatient Medications on File Prior to Visit  Medication Sig Dispense Refill  . acetaminophen (TYLENOL)  650 MG CR tablet Take 1,300 mg by mouth every 8 (eight) hours as needed for pain.    Marland Kitchen amLODipine (NORVASC) 10 MG tablet Take 1 tablet (10 mg total) by mouth daily. 90 tablet 3  . Blood Glucose Monitoring Suppl (ACCU-CHEK GUIDE ME) w/Device KIT 1 Device by Does not apply route 4 (four) times daily -  before meals and at bedtime. 1 kit 0  . calcitRIOL (ROCALTROL) 0.25 MCG capsule Take 0.25 mcg by mouth every Monday, Wednesday, and Friday.    . carvedilol (COREG) 25 MG tablet Take 1 tablet (25 mg total) by mouth 2 (two) times daily. 180 tablet 1  . Cholecalciferol (VITAMIN D-3) 125 MCG (5000 UT) TABS Take 5,000 mcg by mouth daily.    . Continuous Blood Gluc Sensor (FREESTYLE LIBRE 14 DAY SENSOR) MISC 1 Device by Does not apply route every 14 (fourteen) days. 6 each 3  . Cyanocobalamin 2500 MCG TABS Take 5,000 mcg by mouth daily. Vitamin b12     . Dextromethorphan-guaiFENesin (CORICIDIN HBP CONGESTION/COUGH) 10-200 MG CAPS Take 2 capsules by mouth daily as needed (cough/congestion).    Marland Kitchen ELIQUIS 5 MG TABS tablet TAKE 1 TABLET BY MOUTH TWICE A DAY (Patient taking differently: Take 5 mg by mouth 2 (two) times daily. ) 60 tablet 3  . Ensure (ENSURE) Take 237 mLs by mouth daily before lunch.     . furosemide (LASIX) 80 MG tablet Take 80 mg by mouth See admin instructions. Take 80 mg by mouth in the morning and additional 80 mg before bedtime as needed for swelling of the ankles    . gabapentin (NEURONTIN) 600 MG  tablet Take 0.5 tablets (300 mg total) by mouth 2 (two) times daily. (Patient taking differently: Take 300 mg by mouth 3 (three) times daily. ) 60 tablet 0  . glucose 4 GM chewable tablet Chew 1 tablet by mouth as needed for low blood sugar.    . hydrALAZINE (APRESOLINE) 25 MG tablet Take 25 mg by mouth 3 (three) times daily.     . isosorbide mononitrate (IMDUR) 60 MG 24 hr tablet Take 1 tablet (60 mg total) by mouth daily. 30 tablet 0  . linaclotide (LINZESS) 145 MCG CAPS capsule Take 1 capsule  (145 mcg total) by mouth daily as needed (constipation). 30 capsule 0  . montelukast (SINGULAIR) 10 MG tablet Take 1 tablet (10 mg total) by mouth at bedtime. 90 tablet 3  . NOVOLOG FLEXPEN 100 UNIT/ML FlexPen INJECT 3 UNITS INTO THE SKIN 3 (THREE) TIMES DAILY WITH MEALS. (Patient taking differently: Inject 3-5 Units into the skin 3 (three) times daily with meals. ) 15 mL 11  . omeprazole (PRILOSEC OTC) 20 MG tablet Take 20 mg by mouth daily before breakfast.     . ondansetron (ZOFRAN) 4 MG tablet Take 1 tablet (4 mg total) by mouth every 8 (eight) hours as needed for nausea. 20 tablet 0  . rosuvastatin (CRESTOR) 20 MG tablet TAKE 1 TABLET (20 MG TOTAL) BY MOUTH DAILY AT 6 PM. (Patient taking differently: Take 20 mg by mouth at bedtime. ) 90 tablet 1  . tamsulosin (FLOMAX) 0.4 MG CAPS capsule Take 0.4 mg by mouth at bedtime.    Sallye Lat (VISINE ADVANCED RELIEF) 0.05-0.1-1-1 % SOLN Place 1 drop into both eyes 3 (three) times daily as needed.    . vitamin E (VITAMIN E) 1000 UNIT capsule Take 1,000 Units by mouth daily.      No current facility-administered medications on file prior to visit.    Cardiovascular studies:  Lower Extremity Arterial Duplex 01/18/2020:  No hemodynamically significant stenosis noted in the bilateral common and  superficial femoral arteries.  Bilateral anterior and post tibial spectral waveform demonstrates a  monophasic flow pattern suggestive of severe diffuse small vessel disease.  This exam reveals normal perfusion of the right and left lower extremity  (ABI 0.97).   The ABI may be falsely elevated in a patient with arteriosclerosis.  Consider further work up for CLI if clinically indicated.  EKG 01/08/2020: Sinus rhythm 66 bpm. Left ventricular hypertrophy. ST-T changes related to LVH.  Coronary angiogram 10/20/2018: LM: Normal LAD: Distal 70% diffuse disease.        Ostial 90% stenosis in Diag1. Patent prox Diag 1 stent LCx: Large  OM1 with patent prior stent RCA: 100% occluded RPDA        Partially successful PTCA attempt        100%--99% stenosis        TIMI flow 0-I  LVEDP 16 mmHg  Echocardiogram 10/17/2018: Left ventricle: The cavity size was normal. Wall thickness was increased in a pattern of severe LVH. Systolic function was normal. The estimated ejection fraction was in the range of 50% to 55%. Wall motion was normal; there were no regional wall motion abnormalities. Doppler parameters are consistent with anormal left ventricular relaxation (grade 1 diastolic dysfunction). Doppler parameters are consistent with high ventricular filling pressure. - Left atrium: The atrium was moderately dilated. - Right atrium: The atrium was mildly dilated.  Impressions: - Low normal to mildly reduced LV systolic function; EF 50; severe LVH; mild diastolic dysfunction; biatrial enlargement.  Recent Labs: 08/02/2020: Glucose 155, BUN/Cr 35/4.1. EGFR 16. Na/K 137/3.6.  H/H 8.8/26.5. MCV 87. Platelets 177 HbA1C 7.3%   03/01/2020: Hemoglobin 11  Iron 60, TIBC 231.  01/06/2020: Glucose 173, BUN/Cr 43/2.9.  Na/K 137/4.8. Rest of the CMP normal  12/11/2019: Glucose 151. BUN/Cr 28/2.6. HbA1C 7.7%.   11//2020: Glucose 177, BUN/Cr 27/2.72. EGFR 23. Na/K 140/4.5. Albumin 2.4 low. Rest of the CMP normal H/H 8.9/27.8. MCV 86. Platelets 177 HbA1C 8.0%   02/2018: Chol 162, TG 95, HDL 37, LDL 105  Review of Systems  Constitutional: Negative for malaise/fatigue.  Cardiovascular: Positive for leg swelling (Improved). Negative for chest pain, dyspnea on exertion, palpitations and syncope.       LE ulcer   Respiratory: Negative for shortness of breath.   Genitourinary:       Erectile dysfunction  All other systems reviewed and are negative.      Objective:    Vitals:   09/19/20 1544  BP: 129/62  Pulse: 66  Resp: 16  SpO2: 95%     Physical Exam Vitals and nursing note reviewed.  Constitutional:       General: He is not in acute distress. Neck:     Vascular: No JVD.  Cardiovascular:     Rate and Rhythm: Normal rate. Rhythm irregularly irregular.     Heart sounds: Murmur (Flow murmur likely due to LUE AV fistula) heard.      Comments: Not performed today, as per patient's wishes. Pulmonary:     Effort: Pulmonary effort is normal.     Breath sounds: Normal breath sounds. No wheezing or rales.         Assessment & Recommendations:   69 y.o.African American male with hypertension, hyperlipidemia, type 2 DM, CAD, PAD, advanced CKD stage, h/o cardioversion for persistent Afib, fall (08/2020), small pericardial effusion, pulmonary hypertension   Fall/loss of conscisouness: Likely multifactorial, combination of vertigo, neuropathy.  Will place on 2 week cardiac telemetry.  Pulmonary hypertension: Unlikely PAH. Will repeat echocardiogram in 4 weeks to re-evalaute pulmonary pressures and pericardial effusion.  PAD: Toe ulcer healed well.  He likely has severe PAD, especially below the knee. His ulcer has healed well, he does not have any claudication. He has advanced CKD, but not on dialysis yet. CO2 angiogram will likely be inadequate to evaluate and intervene on below the knee disease. In order to preserve his renal function, I would hold off contrast angiogram unless he develps critical limb ischemia (nonhealing ulcer, resting pain, gangrene). I counseled patient to check for any injuries on his feet on a daily basis. If any new wounds appears, he should contact us immediately.  Continue current medical therapy, including, statin Not on Aspirin due to ongoing use of eliquis and prior h/o GI bleeding.   Atrial fibrillation: Persistent, now in sinus rhythm s/p cardioversion 09/29/2019. CHA2DS2VASc score 4, annual stroke risk 5%. Continue eliquis 5 mg bid.  Patient is applying for patient assistance through manufacturer.   CAD: CAD s/p prior PCI, NSTEMI in 09/2018, attempted but  unsuccessful. PTCA to chronically occluded Rt PDA Stable without angina symptoms. Given his advanced CKD, I do not recommend further revascualrization attempts.  Hypertension: Uncontrolled, primarily related to advanced CKD.  Management per nephrology.    CKD: Advanced, still makes urine. Okay to increase lasix from 80 mg bid to 80 mg tid prn  Mild bilateral carotid stenosis: Continue medical management  F/u in 4 weeks  Hayli Milligan Esther Hardy, MD [facility] Cardiovascular. [place] Pager: [phone] Office:  [phone] If no answer Cell [phone]

## 2020-09-20 LAB — PH, BODY FLUID: pH, Body Fluid: 7.6

## 2020-09-20 LAB — CYTOLOGY - NON PAP

## 2020-09-21 LAB — CULTURE, BODY FLUID W GRAM STAIN -BOTTLE: Culture: NO GROWTH

## 2020-09-23 ENCOUNTER — Encounter (HOSPITAL_COMMUNITY): Payer: BC Managed Care – PPO

## 2020-09-28 ENCOUNTER — Other Ambulatory Visit (HOSPITAL_COMMUNITY): Payer: Self-pay | Admitting: *Deleted

## 2020-09-28 NOTE — Telephone Encounter (Signed)
error 

## 2020-09-29 ENCOUNTER — Other Ambulatory Visit: Payer: Self-pay

## 2020-09-29 ENCOUNTER — Encounter (HOSPITAL_COMMUNITY)
Admission: RE | Admit: 2020-09-29 | Discharge: 2020-09-29 | Disposition: A | Discharge status: Home / Self Care | Payer: BC Managed Care – PPO | Source: Ambulatory Visit | Attending: Nephrology | Admitting: Nephrology

## 2020-09-29 ENCOUNTER — Telehealth: Payer: Self-pay

## 2020-09-29 VITALS — BP 179/79 | HR 73 | Temp 97.6°F | Resp 20

## 2020-09-29 DIAGNOSIS — N1831 Chronic kidney disease, stage 3a: Secondary | ICD-10-CM

## 2020-09-29 LAB — POCT HEMOGLOBIN-HEMACUE: Hemoglobin: 9.7 g/dL — ABNORMAL LOW (ref 13.0–17.0)

## 2020-09-29 MED ORDER — SODIUM CHLORIDE 0.9 % IV SOLN
510.0000 mg | Freq: Once | INTRAVENOUS | Status: AC
  Administered 2020-09-29: 510 mg via INTRAVENOUS
  Filled 2020-09-29: qty 17

## 2020-09-29 MED ORDER — CLONIDINE HCL 0.1 MG PO TABS: 0.1000 mg | ORAL_TABLET | Freq: Once | ORAL | Status: DC | PRN

## 2020-09-29 MED ORDER — EPOETIN ALFA-EPBX 10000 UNIT/ML IJ SOLN: 20000.0000 [IU] | INTRAMUSCULAR | Status: DC

## 2020-09-29 MED ORDER — EPOETIN ALFA-EPBX 10000 UNIT/ML IJ SOLN
INTRAMUSCULAR | Status: AC
  Administered 2020-09-29: 20000 [IU] via SUBCUTANEOUS
  Filled 2020-09-29: qty 2

## 2020-09-29 NOTE — Discharge Instructions (Signed)

## 2020-09-29 NOTE — Telephone Encounter (Signed)
irhythm requested office not regarding pt's syncope/fall. I faxed last OV [phone]

## 2020-10-04 ENCOUNTER — Other Ambulatory Visit: Payer: Self-pay

## 2020-10-04 NOTE — Patient Outreach (Signed)
[facility]) Care Management  10/04/2020  [place] [birth year] [id number]   Telephone Screen  Referral Date: 10/04/2020 Referral Source: MD Office Referral Reason: " medication mgmt" Insurance: [facility]   Outreach attempt # 1 to patient. No answer after several rings and unable to leave message.     Plan: RN CM will make outreach attempt to patient within 3-4 business days. RN CM will send unsuccessful outreach letter to patient.   Enzo Montgomery, RN,BSN,CCM [place] Management Telephonic Care Management Coordinator Direct Phone: [phone] Toll Free: [phone] Fax: [phone]

## 2020-10-05 ENCOUNTER — Other Ambulatory Visit: Payer: Self-pay

## 2020-10-05 DIAGNOSIS — N2581 Secondary hyperparathyroidism of renal origin: Secondary | ICD-10-CM | POA: Diagnosis not present

## 2020-10-05 NOTE — Patient Outreach (Signed)
[place] [facility]) Care Management  10/05/2020  [place] Dec 18, 1950 [id number]   Telephone Screen  Referral Date: 10/04/2020 Referral Source: MD Office Referral Reason: " medication mgmt" Insurance: [facility]     Outreach attempt # 2 to patient. No answer after multiple rings and unable to leave message.       Plan:  RN CM will make outreach attempt to patient within 3-4 business days.   Enzo Montgomery, RN,BSN,CCM [place] Management Telephonic Care Management Coordinator Direct Phone: [phone] Toll Free: [phone] Fax: [phone]

## 2020-10-07 ENCOUNTER — Other Ambulatory Visit: Payer: Self-pay

## 2020-10-07 NOTE — Patient Outreach (Signed)
[facility]) Care Management  10/07/2020  Muhammad Vacca [facility] 24-Jul-1951 [id number]   Telephone Screen  Referral Date:10/04/2020 Referral Source:MD Office Referral Reason:" medication mgmt" Insurance:[employer]    Outreach attempt # 3 to patient. Spoke with patient and reviewed and discussed referral reason and source. Patient reports he gets confused regarding his meds often especially when MDs make med changes. He lives alone and is trying to fill med planner on his own. He does not feel like he is doing it correctly. He voices concerns with being able to afford his meds as well. He states that his Eliquis and Novolog are very expensive and he is unsure of how he will continue to be able to afford the,. Patient repots that his meds are on different refill scheduled at pharmacy which is confusing for him and makes him have to go for multiple trips to pharmacy per month. RN CM  discussed referral to [facility] for further assistance with med issues and patient gave verbal consent. He states that he is supposed to be getting "someone to come into the home to help him with this as well" but unable to provide further info and details related to this. He thinks possibly a Higher education careers adviser. Advised patient that [facility] does not make home visits and he voiced understanding. He does not feel like he needs a nurse to call him. He reported his only issue is someone who can physically help him manage his meds.        Plan: RN CM completed [place] dept referral for further assistant with med issues. Patient made aware that someone from pharmacy team would be contacting him.   Enzo Montgomery, RN,BSN,CCM [place] Management Telephonic Care Management Coordinator Direct Phone: [phone] Toll Free: [phone] Fax: [phone]

## 2020-10-07 NOTE — Patient Outreach (Addendum)
[facility]) Care Management  10/07/2020  [place] [facility] [birth year] [id number]   Referral received from [facility] for medication assistance.  CMA has forwarded referral to [employer] at [facility] for follow up.  Ina Homes [facility] Management Assistant [phone]

## 2020-10-13 ENCOUNTER — Other Ambulatory Visit: Payer: Self-pay

## 2020-10-13 ENCOUNTER — Encounter (HOSPITAL_COMMUNITY)
Admission: RE | Admit: 2020-10-13 | Discharge: 2020-10-13 | Disposition: A | Discharge status: Home / Self Care | Payer: BC Managed Care – PPO | Source: Ambulatory Visit | Attending: Nephrology | Admitting: Nephrology

## 2020-10-13 VITALS — BP 158/76 | HR 85 | Temp 97.8°F | Resp 20

## 2020-10-13 DIAGNOSIS — N1831 Chronic kidney disease, stage 3a: Secondary | ICD-10-CM | POA: Diagnosis not present

## 2020-10-13 LAB — FERRITIN: Ferritin: 341 ng/mL — ABNORMAL HIGH (ref 24–336)

## 2020-10-13 LAB — IRON AND TIBC
Iron: 66 ug/dL (ref 45–182)
Saturation Ratios: 30 % (ref 17.9–39.5)
TIBC: 220 ug/dL — ABNORMAL LOW (ref 250–450)
UIBC: 154 ug/dL

## 2020-10-13 LAB — POCT HEMOGLOBIN-HEMACUE: Hemoglobin: 10 g/dL — ABNORMAL LOW (ref 13.0–17.0)

## 2020-10-13 MED ORDER — EPOETIN ALFA-EPBX 10000 UNIT/ML IJ SOLN
INTRAMUSCULAR | Status: AC
  Filled 2020-10-13: qty 2

## 2020-10-13 MED ORDER — EPOETIN ALFA-EPBX 10000 UNIT/ML IJ SOLN
20000.0000 [IU] | INTRAMUSCULAR | Status: DC
  Administered 2020-10-13: 20000 [IU] via SUBCUTANEOUS

## 2020-10-13 MED ORDER — CLONIDINE HCL 0.1 MG PO TABS: 0.1000 mg | ORAL_TABLET | Freq: Once | ORAL | Status: DC | PRN

## 2020-10-18 DIAGNOSIS — H5703 Miosis: Secondary | ICD-10-CM | POA: Diagnosis not present

## 2020-10-18 DIAGNOSIS — H18513 Endothelial corneal dystrophy, bilateral: Secondary | ICD-10-CM | POA: Diagnosis not present

## 2020-10-18 DIAGNOSIS — E113491 Type 2 diabetes mellitus with severe nonproliferative diabetic retinopathy without macular edema, right eye: Secondary | ICD-10-CM | POA: Diagnosis not present

## 2020-10-18 DIAGNOSIS — H25812 Combined forms of age-related cataract, left eye: Secondary | ICD-10-CM | POA: Diagnosis not present

## 2020-10-18 DIAGNOSIS — H4312 Vitreous hemorrhage, left eye: Secondary | ICD-10-CM | POA: Diagnosis not present

## 2020-10-18 DIAGNOSIS — H40023 Open angle with borderline findings, high risk, bilateral: Secondary | ICD-10-CM | POA: Diagnosis not present

## 2020-10-18 DIAGNOSIS — Z961 Presence of intraocular lens: Secondary | ICD-10-CM | POA: Diagnosis not present

## 2020-10-18 DIAGNOSIS — E113592 Type 2 diabetes mellitus with proliferative diabetic retinopathy without macular edema, left eye: Secondary | ICD-10-CM | POA: Diagnosis not present

## 2020-10-19 ENCOUNTER — Ambulatory Visit (INDEPENDENT_AMBULATORY_CARE_PROVIDER_SITE_OTHER): Payer: BC Managed Care – PPO | Admitting: Ophthalmology

## 2020-10-19 ENCOUNTER — Encounter (INDEPENDENT_AMBULATORY_CARE_PROVIDER_SITE_OTHER): Payer: Self-pay | Admitting: Ophthalmology

## 2020-10-19 ENCOUNTER — Other Ambulatory Visit: Payer: Self-pay

## 2020-10-19 ENCOUNTER — Telehealth: Payer: Self-pay | Admitting: Internal Medicine

## 2020-10-19 DIAGNOSIS — E113411 Type 2 diabetes mellitus with severe nonproliferative diabetic retinopathy with macular edema, right eye: Secondary | ICD-10-CM

## 2020-10-19 DIAGNOSIS — H4312 Vitreous hemorrhage, left eye: Secondary | ICD-10-CM | POA: Diagnosis not present

## 2020-10-19 DIAGNOSIS — H3581 Retinal edema: Secondary | ICD-10-CM

## 2020-10-19 DIAGNOSIS — H40003 Preglaucoma, unspecified, bilateral: Secondary | ICD-10-CM

## 2020-10-19 DIAGNOSIS — Z961 Presence of intraocular lens: Secondary | ICD-10-CM | POA: Diagnosis not present

## 2020-10-19 DIAGNOSIS — I10 Essential (primary) hypertension: Secondary | ICD-10-CM | POA: Diagnosis not present

## 2020-10-19 DIAGNOSIS — H43822 Vitreomacular adhesion, left eye: Secondary | ICD-10-CM

## 2020-10-19 DIAGNOSIS — H25812 Combined forms of age-related cataract, left eye: Secondary | ICD-10-CM

## 2020-10-19 DIAGNOSIS — E113512 Type 2 diabetes mellitus with proliferative diabetic retinopathy with macular edema, left eye: Secondary | ICD-10-CM

## 2020-10-19 DIAGNOSIS — H35033 Hypertensive retinopathy, bilateral: Secondary | ICD-10-CM

## 2020-10-19 DIAGNOSIS — H18513 Endothelial corneal dystrophy, bilateral: Secondary | ICD-10-CM
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
## 2020-10-20 DIAGNOSIS — H4312 Vitreous hemorrhage, left eye: Secondary | ICD-10-CM | POA: Diagnosis not present

## 2020-10-20 DIAGNOSIS — E113512 Type 2 diabetes mellitus with proliferative diabetic retinopathy with macular edema, left eye: Secondary | ICD-10-CM

## 2020-10-20 MED ORDER — BEVACIZUMAB CHEMO INJECTION 1.25MG/0.05ML SYRINGE FOR KALEIDOSCOPE
1.2500 mg | INTRAVITREAL | Status: AC | PRN
  Administered 2020-10-20: 1.25 mg via INTRAVITREAL

## 2020-10-27 ENCOUNTER — Other Ambulatory Visit: Payer: Self-pay

## 2020-10-27 ENCOUNTER — Encounter (HOSPITAL_COMMUNITY)
Admission: RE | Admit: 2020-10-27 | Discharge: 2020-10-27 | Disposition: A | Discharge status: Home / Self Care | Payer: BC Managed Care – PPO | Source: Ambulatory Visit | Attending: Nephrology | Admitting: Nephrology

## 2020-10-27 VITALS — BP 175/84 | HR 85 | Temp 97.5°F | Resp 20

## 2020-10-27 DIAGNOSIS — N1831 Chronic kidney disease, stage 3a: Secondary | ICD-10-CM | POA: Diagnosis not present

## 2020-10-27 MED ORDER — EPOETIN ALFA-EPBX 10000 UNIT/ML IJ SOLN
20000.0000 [IU] | INTRAMUSCULAR | Status: DC
  Administered 2020-10-27: 20000 [IU] via SUBCUTANEOUS

## 2020-10-27 MED ORDER — EPOETIN ALFA-EPBX 10000 UNIT/ML IJ SOLN
INTRAMUSCULAR | Status: AC
  Filled 2020-10-27: qty 2

## 2020-10-31 ENCOUNTER — Ambulatory Visit: Payer: BC Managed Care – PPO | Admitting: Cardiology

## 2020-10-31 LAB — POCT HEMOGLOBIN-HEMACUE: Hemoglobin: 11.1 g/dL — ABNORMAL LOW (ref 13.0–17.0)

## 2020-10-31 NOTE — Progress Notes (Signed)
Rescheduled

## 2020-11-02 ENCOUNTER — Ambulatory Visit: Payer: BC Managed Care – PPO | Admitting: Cardiology

## 2020-11-02 ENCOUNTER — Encounter (INDEPENDENT_AMBULATORY_CARE_PROVIDER_SITE_OTHER): Payer: Self-pay | Admitting: Ophthalmology

## 2020-11-02 ENCOUNTER — Encounter: Payer: Self-pay | Admitting: Cardiology

## 2020-11-02 ENCOUNTER — Ambulatory Visit (INDEPENDENT_AMBULATORY_CARE_PROVIDER_SITE_OTHER): Payer: BC Managed Care – PPO | Admitting: Ophthalmology

## 2020-11-02 ENCOUNTER — Other Ambulatory Visit: Payer: Self-pay

## 2020-11-02 VITALS — BP 170/92 | HR 74 | Resp 16 | Ht 75.0 in | Wt 195.6 lb

## 2020-11-02 DIAGNOSIS — E113411 Type 2 diabetes mellitus with severe nonproliferative diabetic retinopathy with macular edema, right eye: Secondary | ICD-10-CM | POA: Diagnosis not present

## 2020-11-02 DIAGNOSIS — I313 Pericardial effusion (noninflammatory): Secondary | ICD-10-CM | POA: Insufficient documentation

## 2020-11-02 DIAGNOSIS — H25812 Combined forms of age-related cataract, left eye: Secondary | ICD-10-CM

## 2020-11-02 DIAGNOSIS — Z961 Presence of intraocular lens: Secondary | ICD-10-CM | POA: Diagnosis not present

## 2020-11-02 DIAGNOSIS — H43822 Vitreomacular adhesion, left eye: Secondary | ICD-10-CM | POA: Diagnosis not present

## 2020-11-02 DIAGNOSIS — H3581 Retinal edema: Secondary | ICD-10-CM

## 2020-11-02 DIAGNOSIS — I10 Essential (primary) hypertension: Secondary | ICD-10-CM

## 2020-11-02 DIAGNOSIS — E113512 Type 2 diabetes mellitus with proliferative diabetic retinopathy with macular edema, left eye: Secondary | ICD-10-CM

## 2020-11-02 DIAGNOSIS — I2510 Atherosclerotic heart disease of native coronary artery without angina pectoris: Secondary | ICD-10-CM

## 2020-11-02 DIAGNOSIS — I3139 Other pericardial effusion (noninflammatory): Secondary | ICD-10-CM

## 2020-11-02 DIAGNOSIS — H18513 Endothelial corneal dystrophy, bilateral: Secondary | ICD-10-CM

## 2020-11-02 DIAGNOSIS — H35033 Hypertensive retinopathy, bilateral: Secondary | ICD-10-CM | POA: Diagnosis not present

## 2020-11-02 DIAGNOSIS — H40003 Preglaucoma, unspecified, bilateral: Secondary | ICD-10-CM

## 2020-11-02 DIAGNOSIS — I739 Peripheral vascular disease, unspecified: Secondary | ICD-10-CM | POA: Diagnosis not present

## 2020-11-02 DIAGNOSIS — H4312 Vitreous hemorrhage, left eye: Secondary | ICD-10-CM
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
## 2020-11-02 NOTE — Progress Notes (Signed)
Patient is here for follow up visit.  Subjective:   William Webb, male    DOB: 01-07-1951, 70 y.o.   MRN: [id number]   Chief Complaint  Patient presents with  . Atrial Fibrillation  . Hypertension  . Follow-up    4 week   HPI  70 y.o.African American male with hypertension, hyperlipidemia, type 2 DM, CAD, PAD, advanced CKD stage, h/o cardioversion for persistent Afib, fall (08/2020), small pericardial effusion, pulmonary hypertension   Patient has not had any recurrent syncope episodes since November 2021.  Recent cardiac monitor results reviewed with the patient, details below.  Blood pressure is elevated today, but generally better controlled at home.  He is following closely with nephrology regarding his advanced CKD.  They have recently increase his hydralazine to 50 mg 3 times daily.   Current Outpatient Medications on File Prior to Visit  Medication Sig Dispense Refill  . acetaminophen (TYLENOL) 650 MG CR tablet Take 1,300 mg by mouth every 8 (eight) hours as needed for pain.    . Alcohol Swabs (B-D SINGLE USE SWABS REGULAR) PADS     . amLODipine (NORVASC) 10 MG tablet Take 1 tablet (10 mg total) by mouth daily. 90 tablet 3  . Blood Glucose Monitoring Suppl (ACCU-CHEK GUIDE ME) w/Device KIT 1 Device by Does not apply route 4 (four) times daily -  before meals and at bedtime. 1 kit 0  . calcitRIOL (ROCALTROL) 0.25 MCG capsule Take 0.25 mcg by mouth every Monday, Wednesday, and Friday.    . carvedilol (COREG) 25 MG tablet Take 1 tablet (25 mg total) by mouth 2 (two) times daily. 180 tablet 1  . Cholecalciferol (VITAMIN D-3) 125 MCG (5000 UT) TABS Take 5,000 mcg by mouth daily.    . Continuous Blood Gluc Sensor (FREESTYLE LIBRE 14 DAY SENSOR) MISC 1 Device by Does not apply route every 14 (fourteen) days. 6 each 3  . Cyanocobalamin 2500 MCG TABS Take 5,000 mcg by mouth daily. Vitamin b12    . Dextromethorphan-guaiFENesin (CORICIDIN HBP CONGESTION/COUGH) 10-200 MG  CAPS Take 2 capsules by mouth daily as needed (cough/congestion).    Marland Kitchen ELIQUIS 5 MG TABS tablet TAKE 1 TABLET BY MOUTH TWICE A DAY (Patient taking differently: Take 5 mg by mouth 2 (two) times daily.) 60 tablet 3  . Ensure (ENSURE) Take 237 mLs by mouth daily before lunch.     . furosemide (LASIX) 80 MG tablet Take 80 mg by mouth See admin instructions. Take 80 mg by mouth twice a day and additional 80 mg before bedtime as needed for swelling of the ankles   Patient states taking 56m 3x/day    . gabapentin (NEURONTIN) 600 MG tablet Take 0.5 tablets (300 mg total) by mouth 2 (two) times daily. (Patient taking differently: Take 300 mg by mouth 3 (three) times daily.) 60 tablet 0  . glucose 4 GM chewable tablet Chew 1 tablet by mouth as needed for low blood sugar.    . hydrALAZINE (APRESOLINE) 25 MG tablet Take 25 mg by mouth 3 (three) times daily. Patient states taking 524m3x/day    . hydrALAZINE (APRESOLINE) 50 MG tablet     . isosorbide mononitrate (IMDUR) 60 MG 24 hr tablet Take 1 tablet (60 mg total) by mouth daily. 30 tablet 0  . linaclotide (LINZESS) 145 MCG CAPS capsule Take 1 capsule (145 mcg total) by mouth daily as needed (constipation). 30 capsule 0  . methocarbamol (ROBAXIN) 500 MG tablet     .  montelukast (SINGULAIR) 10 MG tablet Take 1 tablet (10 mg total) by mouth at bedtime. 90 tablet 3  . NOVOLOG FLEXPEN 100 UNIT/ML FlexPen INJECT 3 UNITS INTO THE SKIN 3 (THREE) TIMES DAILY WITH MEALS. (Patient taking differently: Inject 3-5 Units into the skin 3 (three) times daily with meals. As needed) 15 mL 11  . omeprazole (PRILOSEC OTC) 20 MG tablet Take 20 mg by mouth daily before breakfast.     . omeprazole (PRILOSEC) 20 MG capsule     . ondansetron (ZOFRAN) 4 MG tablet Take 1 tablet (4 mg total) by mouth every 8 (eight) hours as needed for nausea. 20 tablet 0  . rosuvastatin (CRESTOR) 20 MG tablet TAKE 1 TABLET (20 MG TOTAL) BY MOUTH DAILY AT 6 PM. (Patient taking differently: Take 20 mg  by mouth at bedtime.) 90 tablet 1  . tadalafil (CIALIS) 20 MG tablet     . tamsulosin (FLOMAX) 0.4 MG CAPS capsule Take 0.4 mg by mouth at bedtime.    Sallye Lat (VISINE ADVANCED RELIEF) 0.05-0.1-1-1 % SOLN Place 1 drop into both eyes 3 (three) times daily as needed. Taking as needed per pt    . vitamin E (VITAMIN E) 1000 UNIT capsule Take 1,000 Units by mouth daily.      No current facility-administered medications on file prior to visit.    Cardiovascular studies:  Mobile cardiac telemetry 13 days 09/19/2020 - 10/03/2020: Dominant rhythm: Sinus. HR 58-95 bpm. Avg HR 71 bpm, while in sinus rhythm. 33 episodes of SVT, fastest at 132 bpm for 5 beats, longest for 20 beats at 113 bpm. 4,4% isolated SVE, <1% couplet/triplets. 11 episodes of NSVT, fastest at 222 bpm for 7 beats, longest for 19 beats at 145 bpm. <1% isolated VE, couplet/triplets. No atrial fibrillation/atrial flutter//high grade AV block, sinus pause >3sec noted. 0 patient triggered events.     Lower Extremity Arterial Duplex 01/18/2020:  No hemodynamically significant stenosis noted in the bilateral common and  superficial femoral arteries.  Bilateral anterior and post tibial spectral waveform demonstrates a  monophasic flow pattern suggestive of severe diffuse small vessel disease.  This exam reveals normal perfusion of the right and left lower extremity  (ABI 0.97).   The ABI may be falsely elevated in a patient with arteriosclerosis.  Consider further work up for CLI if clinically indicated.  EKG 01/08/2020: Sinus rhythm 66 bpm. Left ventricular hypertrophy. ST-T changes related to LVH.  Coronary angiogram 10/20/2018: LM: Normal LAD: Distal 70% diffuse disease.        Ostial 90% stenosis in Diag1. Patent prox Diag 1 stent LCx: Large OM1 with patent prior stent RCA: 100% occluded RPDA        Partially successful PTCA attempt        100%--99% stenosis        TIMI flow 0-I  LVEDP 16  mmHg  Echocardiogram 10/17/2018: Left ventricle: The cavity size was normal. Wall thickness was increased in a pattern of severe LVH. Systolic function was normal. The estimated ejection fraction was in the range of 50% to 55%. Wall motion was normal; there were no regional wall motion abnormalities. Doppler parameters are consistent with anormal left ventricular relaxation (grade 1 diastolic dysfunction). Doppler parameters are consistent with high ventricular filling pressure. - Left atrium: The atrium was moderately dilated. - Right atrium: The atrium was mildly dilated.  Impressions: - Low normal to mildly reduced LV systolic function; EF 50; severe LVH; mild diastolic dysfunction; biatrial enlargement.  Recent Labs: 10/27/2020: Hb  11.1  09/17/2020: Glucose 137, BUN/Cr 35/4.03. EGFR 15. Na/K 137/4.2.   08/02/2020: Glucose 155, BUN/Cr 35/4.1. EGFR 16. Na/K 137/3.6.  H/H 8.8/26.5. MCV 87. Platelets 177 HbA1C 7.3%   03/01/2020: Hemoglobin 11  Iron 60, TIBC 231.  01/06/2020: Glucose 173, BUN/Cr 43/2.9.  Na/K 137/4.8. Rest of the CMP normal  12/11/2019: Glucose 151. BUN/Cr 28/2.6. HbA1C 7.7%.   11//2020: Glucose 177, BUN/Cr 27/2.72. EGFR 23. Na/K 140/4.5. Albumin 2.4 low. Rest of the CMP normal H/H 8.9/27.8. MCV 86. Platelets 177 HbA1C 8.0%   02/2018: Chol 162, TG 95, HDL 37, LDL 105  Review of Systems  Constitutional: Negative for malaise/fatigue.  Cardiovascular: Positive for leg swelling (Improved). Negative for chest pain, dyspnea on exertion, palpitations and syncope.       LE ulcer   Respiratory: Negative for shortness of breath.   Genitourinary:       Erectile dysfunction  All other systems reviewed and are negative.      Objective:    Vitals:   11/02/20 1046 11/02/20 1101  BP: (!) 190/97 (!) 170/92  Pulse: 74   Resp:    SpO2: 97%      Physical Exam Vitals and nursing note reviewed.  Constitutional:      General: He is not in acute  distress. Neck:     Vascular: No JVD.  Cardiovascular:     Rate and Rhythm: Normal rate. Rhythm irregularly irregular.     Pulses:          Dorsalis pedis pulses are 0 on the right side and 0 on the left side.       Posterior tibial pulses are 0 on the right side and 0 on the left side.     Heart sounds: Murmur (Flow murmur likely due to LUE AV fistula) heard.      Comments: No ulcer, gangrene Pulmonary:     Effort: Pulmonary effort is normal.     Breath sounds: Normal breath sounds. No wheezing or rales.         Assessment & Recommendations:   70 y.o.African American male with hypertension, hyperlipidemia, type 2 DM, CAD, PAD, advanced CKD stage, h/o cardioversion for persistent Afib, fall (08/2020), small pericardial effusion, pulmonary hypertension   Fall/loss of conscisouness: Likely multifactorial, combination of vertigo, neuropathy.  Episodes of SVT up to 20 beats, NSVT up to 19 beats (Nov-Dec 2021) No malignant arhythmia. Continue current medications, including carvedilol 25 mg twice daily  Pulmonary hypertension, pericardial effusion: Seen on echocardiogram 08/2021 Unlikely PAH. Will repeat echocardiogram  PAD: He likely has severe PAD, especially below the knee. His ulcer has healed well, he does not have any claudication. He has advanced CKD, but not on dialysis yet. CO2 angiogram will likely be inadequate to evaluate and intervene on below the knee disease. In order to preserve his renal function, I would hold off contrast angiogram unless he develps critical limb ischemia (nonhealing ulcer, resting pain, gangrene). I counseled patient to check for any injuries on his feet on a daily basis. If any new wounds appears, he should contact us immediately.  Continue current medical therapy, including, statin Not on Aspirin due to ongoing use of eliquis and prior h/o GI bleeding.   Atrial fibrillation: Persistent, now in sinus rhythm s/p cardioversion  09/29/2019. CHA2DS2VASc score 4, annual stroke risk 5%. Continue eliquis 5 mg bid.  We will assist with application for patient assistance through manufacturer.   CAD: CAD s/p prior PCI, NSTEMI in 09/2018, attempted but unsuccessful. PTCA  to chronically occluded Rt PDA Stable without angina symptoms. Given his advanced CKD, I do not recommend further revascualrization attempts.  Hypertension: Uncontrolled, primarily related to advanced CKD.  Management per nephrology.    CKD:  Continue follow-up with nephrology  Mild bilateral carotid stenosis: Continue medical management  F/u in 3 months   Jakaree Pickard Esther Hardy, MD [facility]. [place] Pager: [phone] Office: [phone] If no answer Cell [phone]

## 2020-11-03 ENCOUNTER — Encounter (INDEPENDENT_AMBULATORY_CARE_PROVIDER_SITE_OTHER): Payer: Medicare HMO | Admitting: Ophthalmology

## 2020-11-04 ENCOUNTER — Ambulatory Visit: Payer: BC Managed Care – PPO | Admitting: Cardiology

## 2020-11-08 ENCOUNTER — Ambulatory Visit: Payer: BC Managed Care – PPO

## 2020-11-08 ENCOUNTER — Other Ambulatory Visit: Payer: Self-pay

## 2020-11-08 DIAGNOSIS — I313 Pericardial effusion (noninflammatory): Secondary | ICD-10-CM

## 2020-11-08 DIAGNOSIS — I3139 Other pericardial effusion (noninflammatory): Secondary | ICD-10-CM

## 2020-11-09 ENCOUNTER — Ambulatory Visit: Payer: BC Managed Care – PPO | Admitting: Cardiology

## 2020-11-10 ENCOUNTER — Other Ambulatory Visit: Payer: Self-pay

## 2020-11-10 ENCOUNTER — Encounter (HOSPITAL_COMMUNITY)
Admission: RE | Admit: 2020-11-10 | Discharge: 2020-11-10 | Disposition: A | Discharge status: Home / Self Care | Payer: BC Managed Care – PPO | Source: Ambulatory Visit | Attending: Nephrology | Admitting: Nephrology

## 2020-11-10 VITALS — BP 154/87 | HR 83 | Temp 97.8°F | Resp 20

## 2020-11-10 DIAGNOSIS — D631 Anemia in chronic kidney disease: Secondary | ICD-10-CM | POA: Diagnosis not present

## 2020-11-10 DIAGNOSIS — R260 Ataxic gait: Secondary | ICD-10-CM | POA: Diagnosis not present

## 2020-11-10 DIAGNOSIS — N1831 Chronic kidney disease, stage 3a: Secondary | ICD-10-CM | POA: Insufficient documentation

## 2020-11-10 LAB — POCT HEMOGLOBIN-HEMACUE: Hemoglobin: 11.2 g/dL — ABNORMAL LOW (ref 13.0–17.0)

## 2020-11-10 LAB — IRON AND TIBC
Iron: 41 ug/dL — ABNORMAL LOW (ref 45–182)
Saturation Ratios: 20 % (ref 17.9–39.5)
TIBC: 207 ug/dL — ABNORMAL LOW (ref 250–450)
UIBC: 166 ug/dL

## 2020-11-10 LAB — FERRITIN: Ferritin: 148 ng/mL (ref 24–336)

## 2020-11-10 MED ORDER — EPOETIN ALFA-EPBX 10000 UNIT/ML IJ SOLN
20000.0000 [IU] | INTRAMUSCULAR | Status: DC
  Administered 2020-11-10: 20000 [IU] via SUBCUTANEOUS

## 2020-11-10 MED ORDER — EPOETIN ALFA-EPBX 10000 UNIT/ML IJ SOLN
INTRAMUSCULAR | Status: AC
  Filled 2020-11-10: qty 2

## 2020-11-10 MED ORDER — CLONIDINE HCL 0.1 MG PO TABS: 0.1000 mg | ORAL_TABLET | Freq: Once | ORAL | Status: DC | PRN

## 2020-11-17 ENCOUNTER — Other Ambulatory Visit: Payer: Self-pay

## 2020-11-18 ENCOUNTER — Ambulatory Visit: Payer: BC Managed Care – PPO | Admitting: Internal Medicine

## 2020-11-18 ENCOUNTER — Encounter (INDEPENDENT_AMBULATORY_CARE_PROVIDER_SITE_OTHER): Payer: Medicare HMO | Admitting: Ophthalmology
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
## 2020-11-21 ENCOUNTER — Encounter (INDEPENDENT_AMBULATORY_CARE_PROVIDER_SITE_OTHER): Payer: Medicare HMO | Admitting: Ophthalmology

## 2020-11-21 ENCOUNTER — Ambulatory Visit: Payer: BC Managed Care – PPO | Admitting: Endocrinology

## 2020-11-22 DIAGNOSIS — Z961 Presence of intraocular lens: Secondary | ICD-10-CM | POA: Diagnosis not present

## 2020-11-22 DIAGNOSIS — H25812 Combined forms of age-related cataract, left eye: Secondary | ICD-10-CM | POA: Diagnosis not present

## 2020-11-22 DIAGNOSIS — E113491 Type 2 diabetes mellitus with severe nonproliferative diabetic retinopathy without macular edema, right eye: Secondary | ICD-10-CM | POA: Diagnosis not present

## 2020-11-22 DIAGNOSIS — E113592 Type 2 diabetes mellitus with proliferative diabetic retinopathy without macular edema, left eye: Secondary | ICD-10-CM | POA: Diagnosis not present

## 2020-11-22 DIAGNOSIS — H5703 Miosis: Secondary | ICD-10-CM | POA: Diagnosis not present

## 2020-11-22 DIAGNOSIS — H4312 Vitreous hemorrhage, left eye: Secondary | ICD-10-CM | POA: Diagnosis not present

## 2020-11-22 DIAGNOSIS — H40023 Open angle with borderline findings, high risk, bilateral: Secondary | ICD-10-CM | POA: Diagnosis not present

## 2020-11-22 DIAGNOSIS — H18513 Endothelial corneal dystrophy, bilateral: Secondary | ICD-10-CM | POA: Diagnosis not present

## 2020-11-23 ENCOUNTER — Ambulatory Visit (INDEPENDENT_AMBULATORY_CARE_PROVIDER_SITE_OTHER): Payer: BC Managed Care – PPO | Admitting: Ophthalmology

## 2020-11-23 ENCOUNTER — Other Ambulatory Visit: Payer: Self-pay

## 2020-11-23 DIAGNOSIS — H35033 Hypertensive retinopathy, bilateral: Secondary | ICD-10-CM

## 2020-11-23 DIAGNOSIS — E113512 Type 2 diabetes mellitus with proliferative diabetic retinopathy with macular edema, left eye: Secondary | ICD-10-CM | POA: Diagnosis not present

## 2020-11-23 DIAGNOSIS — H18513 Endothelial corneal dystrophy, bilateral: Secondary | ICD-10-CM

## 2020-11-23 DIAGNOSIS — H43822 Vitreomacular adhesion, left eye: Secondary | ICD-10-CM | POA: Diagnosis not present

## 2020-11-23 DIAGNOSIS — H40003 Preglaucoma, unspecified, bilateral: Secondary | ICD-10-CM

## 2020-11-23 DIAGNOSIS — H3581 Retinal edema: Secondary | ICD-10-CM

## 2020-11-23 DIAGNOSIS — H25812 Combined forms of age-related cataract, left eye: Secondary | ICD-10-CM

## 2020-11-23 DIAGNOSIS — E113411 Type 2 diabetes mellitus with severe nonproliferative diabetic retinopathy with macular edema, right eye: Secondary | ICD-10-CM

## 2020-11-23 DIAGNOSIS — Z961 Presence of intraocular lens: Secondary | ICD-10-CM | POA: Diagnosis not present

## 2020-11-23 DIAGNOSIS — H4312 Vitreous hemorrhage, left eye: Secondary | ICD-10-CM

## 2020-11-23 DIAGNOSIS — I10 Essential (primary) hypertension: Secondary | ICD-10-CM | POA: Diagnosis not present
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
## 2020-11-24 ENCOUNTER — Encounter (HOSPITAL_COMMUNITY)
Admission: RE | Admit: 2020-11-24 | Discharge: 2020-11-24 | Disposition: A | Discharge status: Home / Self Care | Payer: BC Managed Care – PPO | Source: Ambulatory Visit | Attending: Nephrology | Admitting: Nephrology

## 2020-11-24 VITALS — BP 180/83 | HR 74 | Temp 97.5°F | Resp 18

## 2020-11-24 DIAGNOSIS — D631 Anemia in chronic kidney disease: Secondary | ICD-10-CM | POA: Diagnosis not present

## 2020-11-24 DIAGNOSIS — N1831 Chronic kidney disease, stage 3a: Secondary | ICD-10-CM | POA: Diagnosis not present

## 2020-11-24 LAB — POCT HEMOGLOBIN-HEMACUE: Hemoglobin: 10.7 g/dL — ABNORMAL LOW (ref 13.0–17.0)

## 2020-11-24 MED ORDER — EPOETIN ALFA-EPBX 10000 UNIT/ML IJ SOLN
INTRAMUSCULAR | Status: AC
  Filled 2020-11-24: qty 1

## 2020-11-24 MED ORDER — EPOETIN ALFA-EPBX 10000 UNIT/ML IJ SOLN
20000.0000 [IU] | INTRAMUSCULAR | Status: DC
  Administered 2020-11-24: 10000 [IU] via SUBCUTANEOUS

## 2020-11-26 ENCOUNTER — Encounter (INDEPENDENT_AMBULATORY_CARE_PROVIDER_SITE_OTHER): Payer: Self-pay | Admitting: Ophthalmology

## 2020-11-27 DIAGNOSIS — H4312 Vitreous hemorrhage, left eye: Secondary | ICD-10-CM

## 2020-11-27 DIAGNOSIS — H35033 Hypertensive retinopathy, bilateral: Secondary | ICD-10-CM | POA: Diagnosis not present

## 2020-11-27 DIAGNOSIS — E113512 Type 2 diabetes mellitus with proliferative diabetic retinopathy with macular edema, left eye: Secondary | ICD-10-CM

## 2020-11-27 DIAGNOSIS — E113411 Type 2 diabetes mellitus with severe nonproliferative diabetic retinopathy with macular edema, right eye: Secondary | ICD-10-CM | POA: Diagnosis not present

## 2020-11-27 DIAGNOSIS — H25812 Combined forms of age-related cataract, left eye: Secondary | ICD-10-CM | POA: Diagnosis not present

## 2020-11-27 DIAGNOSIS — I10 Essential (primary) hypertension: Secondary | ICD-10-CM | POA: Diagnosis not present

## 2020-11-27 DIAGNOSIS — Z961 Presence of intraocular lens: Secondary | ICD-10-CM | POA: Diagnosis not present

## 2020-11-27 DIAGNOSIS — H3581 Retinal edema: Secondary | ICD-10-CM | POA: Diagnosis not present

## 2020-11-27 DIAGNOSIS — H43822 Vitreomacular adhesion, left eye: Secondary | ICD-10-CM | POA: Diagnosis not present

## 2020-11-27 MED ORDER — BEVACIZUMAB CHEMO INJECTION 1.25MG/0.05ML SYRINGE FOR KALEIDOSCOPE
1.2500 mg | INTRAVITREAL | Status: AC | PRN
Start: 2020-11-27 — End: 2020-11-27
  Administered 2020-11-27: 1.25 mg via INTRAVITREAL

## 2020-12-01 ENCOUNTER — Telehealth: Payer: Self-pay

## 2020-12-01 NOTE — Telephone Encounter (Signed)
We spoke about patient yesterday regarding him needing a clearance for his Dentist to treat him I called the office yesterday they need on paper from you clearing him to be treated assistant said patient is getting a couple extractions done and he might need pre medications and they're just concern about his eliquis.   The office is [place] [phone] Fax [phone]

## 2020-12-01 NOTE — Telephone Encounter (Signed)
Preop letter was filled out and fax

## 2020-12-01 NOTE — Telephone Encounter (Signed)
If eliquis interruption required, recommend holding 2 days before procedure. BG, can you give me a preop letter please? Thanks

## 2020-12-02 ENCOUNTER — Other Ambulatory Visit: Payer: Self-pay

## 2020-12-02 ENCOUNTER — Encounter: Payer: Self-pay | Admitting: Internal Medicine

## 2020-12-02 ENCOUNTER — Ambulatory Visit (INDEPENDENT_AMBULATORY_CARE_PROVIDER_SITE_OTHER): Payer: BC Managed Care – PPO

## 2020-12-02 ENCOUNTER — Ambulatory Visit (INDEPENDENT_AMBULATORY_CARE_PROVIDER_SITE_OTHER): Payer: BC Managed Care – PPO | Admitting: Internal Medicine

## 2020-12-02 DIAGNOSIS — R0600 Dyspnea, unspecified: Secondary | ICD-10-CM

## 2020-12-02 DIAGNOSIS — J811 Chronic pulmonary edema: Secondary | ICD-10-CM | POA: Diagnosis not present

## 2020-12-02 DIAGNOSIS — J90 Pleural effusion, not elsewhere classified: Secondary | ICD-10-CM

## 2020-12-02 DIAGNOSIS — J9811 Atelectasis: Secondary | ICD-10-CM | POA: Diagnosis not present

## 2020-12-02 DIAGNOSIS — R0602 Shortness of breath: Secondary | ICD-10-CM | POA: Diagnosis not present

## 2020-12-02 DIAGNOSIS — R0609 Other forms of dyspnea: Secondary | ICD-10-CM

## 2020-12-02 NOTE — Patient Instructions (Addendum)
Please remember to go to the  x-ray department  for your tests - we will call you with the results when they are available to decide the next step    Late  Add:  F/u pulmonary prn

## 2020-12-02 NOTE — Progress Notes (Signed)
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 Patient instructed to follow-up with his primary care provider for blood work. 2. Cardiology to arrange outpatient follow-up. 3. Patient to call [facility] for outpatient management of his chronic kidney disease   DISCHARGE CONDITION: fair  Diet recommendation: Modified carbohydrate       Filed Weights   10/18/18 0456 10/19/18 0500 10/21/18 0530  Weight: 95.3 kg 95.6 kg 98.5 kg    INITIAL HISTORY: 70 year old African-American malewith a past medical history of insulin-dependent diabetes with episodes of hypoglycemia, chronic kidney disease stage III,essential hypertension, presented with chest pain. Hospitalized for further management.   Reason for Visit:Chest pain,NSTEMI  Consultants:Cardiology. Nephrology.  Procedures:  Transthoracic echocardiogram Study Conclusions  - Left ventricle: The cavity size was normal. Wall thickness was increased in a pattern of severe LVH. Systolic function was normal. The estimated ejection fraction was in the range of 50% to 55%. Wall motion was normal; there were no regional wall motion abnormalities. Doppler parameters are consistent with abnormal left ventricular relaxation (grade 1 diastolic dysfunction). Doppler parameters are  consistent with high ventricular filling pressure. - Left atrium: The atrium was moderately dilated. - Right atrium: The atrium was mildly dilated.  Impressions:  - Low normal to mildly reduced LV systolic function; EF 50; severe LVH; mild diastolic dysfunction; biatrial enlargement.  Cardiac catheterization Conclusion   LM: Normal LAD: Distal 70% diffuse disease. Ostial 90% stenosis in D1. Patent prox Diag 1 stent LCx: Large OM1 with patent prior stent RCA: 100% occluded RPDA Partially successful PTCA attempt 100%--99% stenosis TIMI flow 0-I  LVEDP 16 mmHg  Recommendation: At this point, chances of successful PCI without large amount of contrast were low. Thus, I decided to stop the procedure. Fortunately, his LVEF is normal with normal wall motion. Continue medical management with DAPT (prefer aspirin and Brilinta) and aggressive hypertension and diabetes management.     HOSPITAL COURSE:   NSTEMI Seen by cardiology. Underwent cardiac catheterization 12/23. No intervention performed. Symptomatic and medical management for now. Patient to be continued on dual antiplatelet treatment with aspirin and Brilinta. He was on Plavix previously. Statin. Nitrates. Patient on metoprolol.  Cardiology to arrange outpatient follow-up.  Chronic kidney disease stage III This is secondary to diabetic nephropathy and microvascular disease.  Nephrology was consulted due to need to do cardiac catheterization with exposure to contrast.Patient was given Mucomyst and IV fluids to protect his kidneys due to contrast needed for the catheterization.   Creatinine noted to be slightly higher today.  Discussed with Dr. Justin Mend who is okay with patient being discharged.  He will need to have blood work done in 1 week.  He has good urine output.    Insulin-dependent diabetes mellitus Patient was placed on Lantus in the hospital.  He was continued on NovoLog.   However he was found to have episodes of hypoglycemia.  His HbA1c was 7.1 in October.  Rechecked during this hospitalization and noted to be 6.6.  He is requiring lower doses of his insulin.  He will be asked to discontinue NovoLog completely.  His dose of Tyler Aas will be reduced significantly.  He will be asked not to take his insulin if his CBG is less than 150.  He will need to follow-up with his primary care provider for further management.    Essential hypertension Patient was taken off of valsartan due to his elevated creatinine.  Hydralazine was added.  Continue with the beta-blocker and nitrates and amlodipine.  Blood pressure is better.  Will need close monitoring in the outpatient setting.      11/18/2018  Pulmonary/ 1st office eval/William Webb  Chief Complaint  Patient presents with  . Consult    SOB with exertion, dry cough  Dyspnea:  MMRC3 = can't walk 100 yards even at a slow pace at a flat grade s stopping due to sob   Cough: resolved  Sleep: on horizontal bed with 2 pillows SABA use: none rec Pulmonary follow up is as needed  - your lungs appear to be in very good shape!   11/19/ 2021   R t centesis x 1.6 liters = transudative    12/02/2020  f/u ov/William Webb re: R effusion / gradually worse x 2 weeks Chief Complaint  Patient presents with  . Follow-up    Pt has been noticing that he has blood in his sputum in the mornings.  None today but he can tell that it is going to happen.  He has to keep clearing his throat.   Pt stated that he feels that the fluid is coming back in his lungs again.    Dyspnea: walking s cane/ doing therapy but riding scooter at stores Cough: some in am / somewhat bloody x months on eliquis with freq throat clearing Sleeping: bed is flat / sleeps on 3 pillows  SABA use: none 02: none    No obvious day to day or daytime variability or assoc excess/ purulent sputum or mucus plugs or hemoptysis or cp or chest tightness, subjective wheeze or overt sinus or  hb symptoms.   Sleeping  without nocturnal  or early am exacerbation  of respiratory  c/o's or need for noct saba. Also denies any obvious fluctuation of symptoms with weather or environmental changes or other aggravating or alleviating factors except as outlined above   No unusual exposure hx or h/o childhood pna/ asthma or knowledge of premature birth.  Current Allergies, Complete Past Medical History, Past Surgical History, Family History, and Social History were reviewed in [employer] record.  ROS  The following are not active complaints unless bolded Hoarseness, sore throat, dysphagia, dental problems, itching, sneezing,  nasal congestion or discharge of excess mucus or purulent secretions, ear ache,   fever, chills, sweats, unintended wt loss or wt gain, classically pleuritic or exertional cp,  orthopnea pnd or arm/hand swelling  or leg swelling, presyncope, palpitations, abdominal pain, anorexia, nausea, vomiting, diarrhea  or change in bowel habits or change in bladder habits, change in stools or change in urine, dysuria, hematuria,  rash, arthralgias, visual complaints, headache, numbness, weakness or ataxia or problems with walking or coordination,  change in mood or  memory.        Current Meds  Medication Sig  . acetaminophen (TYLENOL) 650 MG CR tablet Take 1,300 mg by mouth every 8 (eight) hours as needed for pain.  . Alcohol Swabs (B-D SINGLE USE SWABS  REGULAR) PADS   . amLODipine (NORVASC) 10 MG tablet Take 1 tablet (10 mg total) by mouth daily.  . Blood Glucose Monitoring Suppl (ACCU-CHEK GUIDE ME) w/Device KIT 1 Device by Does not apply route 4 (four) times daily -  before meals and at bedtime.  . calcitRIOL (ROCALTROL) 0.25 MCG capsule Take 0.25 mcg by mouth every Monday, Wednesday, and Friday.  . Cholecalciferol (VITAMIN D-3) 125 MCG (5000 UT) TABS Take 5,000 mcg by mouth daily.  . Continuous Blood Gluc Sensor (FREESTYLE LIBRE 14 DAY SENSOR) MISC 1  Device by Does not apply route every 14 (fourteen) days.  . Cyanocobalamin 2500 MCG TABS Take 5,000 mcg by mouth daily. Vitamin b12  . Dextromethorphan-guaiFENesin (CORICIDIN HBP CONGESTION/COUGH) 10-200 MG CAPS Take 2 capsules by mouth daily as needed (cough/congestion).  Marland Kitchen ELIQUIS 5 MG TABS tablet TAKE 1 TABLET BY MOUTH TWICE A DAY (Patient taking differently: Take 5 mg by mouth 2 (two) times daily.)  . Ensure (ENSURE) Take 237 mLs by mouth daily before lunch.   . furosemide (LASIX) 80 MG tablet Take 80 mg by mouth See admin instructions. Take 80 mg by mouth twice a day and additional 80 mg before bedtime as needed for swelling of the ankles   Patient states taking $RemoveBefor'80mg'SYzVDXRjCRqV$  3x/day  . gabapentin (NEURONTIN) 600 MG tablet Take 0.5 tablets (300 mg total) by mouth 2 (two) times daily. (Patient taking differently: Take 300 mg by mouth 3 (three) times daily.)  . glucose 4 GM chewable tablet Chew 1 tablet by mouth as needed for low blood sugar.  . hydrALAZINE (APRESOLINE) 25 MG tablet Take 25 mg by mouth 3 (three) times daily. Patient states taking $RemoveBefor'50mg'PHGywXEAjgEg$  3x/day  . hydrALAZINE (APRESOLINE) 50 MG tablet   . isosorbide mononitrate (IMDUR) 60 MG 24 hr tablet Take 1 tablet (60 mg total) by mouth daily.  Marland Kitchen linaclotide (LINZESS) 145 MCG CAPS capsule Take 1 capsule (145 mcg total) by mouth daily as needed (constipation).  . methocarbamol (ROBAXIN) 500 MG tablet   . montelukast (SINGULAIR) 10 MG tablet Take 1 tablet (10 mg total) by mouth at bedtime.  Marland Kitchen NOVOLOG FLEXPEN 100 UNIT/ML FlexPen INJECT 3 UNITS INTO THE SKIN 3 (THREE) TIMES DAILY WITH MEALS. (Patient taking differently: Inject 3-5 Units into the skin 3 (three) times daily with meals. As needed)  . omeprazole (PRILOSEC) 20 MG capsule   . ondansetron (ZOFRAN) 4 MG tablet Take 1 tablet (4 mg total) by mouth every 8 (eight) hours as needed for nausea.  . rosuvastatin (CRESTOR) 20 MG tablet TAKE 1 TABLET (20 MG TOTAL) BY MOUTH DAILY AT 6 PM. (Patient taking  differently: Take 20 mg by mouth at bedtime.)  . tadalafil (CIALIS) 20 MG tablet   . tamsulosin (FLOMAX) 0.4 MG CAPS capsule Take 0.4 mg by mouth at bedtime.  Sallye Lat (VISINE ADVANCED RELIEF) 0.05-0.1-1-1 % SOLN Place 1 drop into both eyes 3 (three) times daily as needed. Taking as needed per pt  . vitamin E (VITAMIN E) 1000 UNIT capsule Take 1,000 Units by mouth daily.               Past Medical History:  Diagnosis Date  . Anemia   . CKD (chronic kidney disease) stage 3, GFR 30-59 ml/min (HCC)   . Coronary artery disease   . Diabetic peripheral neuropathy ([place])   . GERD (gastroesophageal reflux disease)   . Hyperlipidemia   . Hypertension   . Hypertensive crisis 10/16/2018  . Noncompliance with medication regimen   .  Osteoarthritis    "legs, back" (10/16/2018)  . Pneumonia Jan 02, 2016   "real bad; I died and they had to bring me back" (10/16/2018)  . Seasonal allergies   . Type II diabetes mellitus (HCC)        Objective:     Wt Readings from Last 3 Encounters:  12/02/20 201 lb 4 oz (91.3 kg)  11/02/20 195 lb 9.6 oz (88.7 kg)  09/19/20 188 lb (85.3 kg)     Vital signs reviewed  12/02/2020  - Note at rest 02 sats  97% on RA and bp 148/92    General appearance:    Chronically ill pleasant amb bm nad     HEENT : pt wearing mask not removed for exam due to covid -19 concerns.    NECK :  without JVD/Nodes/TM/ nl carotid upstrokes bilaterally   LUNGS: no acc muscle use,  Nl contour chest which is clear to A and P bilaterally without cough on insp or exp maneuvers   CV:  RRR  no s3 or 2/6 SEM s  increase in P2, and no edema   ABD:  soft and nontender with nl inspiratory excursion in the supine position. No bruits or organomegaly appreciated, bowel sounds nl  MS:  Nl gait/ ext warm without deformities, calf tenderness, cyanosis or clubbing No obvious joint restrictions   SKIN: warm and dry without lesions    NEURO:  alert, approp, nl sensorium  with  no motor or cerebellar deficits apparent.    CXR PA and Lateral:   12/02/2020 :    I personally reviewed images and agree with radiology impression as follows:   1. Constellation of findings are favored to reflect pulmonary edema with scattered areas of atelectasis. Infection could present similarly. 2. Small bilateral pleural effusions.       Assessment

## 2020-12-04 ENCOUNTER — Encounter: Payer: Self-pay | Admitting: Internal Medicine

## 2020-12-04 NOTE — Assessment & Plan Note (Addendum)
11/19/ 2021   R t centesis x 1.6 liters transudative - no significant reaccumulation 12/02/2020 > f/u prn    See doe re monitor for reacummulation risk

## 2020-12-04 NOTE — Assessment & Plan Note (Signed)
Quit smoking 1985 Onset 2017  - Spirometry 11/18/2018  FEV1 2.6 (88%)  Ratio 80 s curvature s prior rx - 11/18/2018   Walked RA  2 laps @  approx 249ft each @ slow pace  stopped due to  End of study, fatigue but no sob or desat   - Echo 09/15/20  1. Left ventricular ejection fraction, by estimation, is 55 to 60%. The  left ventricle has normal function. The left ventricle has no regional  wall motion abnormalities. There is severe left ventricular hypertrophy.  Left ventricular diastolic parameters  are consistent with Grade II diastolic dysfunction (pseudonormalization).  Elevated left atrial pressure.  2. Right ventricular systolic function is normal. The right ventricular  size is normal. There is moderately elevated pulmonary artery systolic  pressure at 47 mmHg  3. Left atrial size was severely dilated.  4. Right atrial size was mildly dilated.  5. A small pericardial effusion is present. The pericardial effusion is  posterior to the left ventricle.  6. Tricuspid valve regurgitation is moderate to severe.  7. The aortic valve is tricuspid. There is mild calcification of the  aortic valve. Aortic valve regurgitation is not visualized. Mild aortic  valve stenosis. Aortic valve area, by VTI measures 1.31 cm. Aortic valve  mean gradient measures 11.3 mmHg.  Aortic valve Vmax measures 2.28 m/s.  8. Compared to previous study in 2019, tricuspid regurgitation has  increased from trace. Small pericardial effusion is new.    Relatively well compensated diastolic dysfunction with no significant reaccumulation of pulmonary edema or effusion on present rx.  Will need to keep close eye on bp and volume status per cards/renal as planned.   Advised for worsening orthopnea despite max medical rx may need to revisit Repeat R T centesis but nothing further needed for now.          Each maintenance medication was reviewed in detail including emphasizing most importantly the difference  between maintenance and prns and under what circumstances the prns are to be triggered using an action plan format where appropriate.  Total time for H and P, chart review, counseling, reviewing   and generating customized AVS unique to this office visit / same day charting  > 30 min

## 2020-12-07 NOTE — Progress Notes (Signed)
Attempted to call patient to go over xray results per Dr Melvyn Novas and was unable to reach patient by phone or leave message due to no voicemail picking up.

## 2020-12-08 ENCOUNTER — Other Ambulatory Visit: Payer: Self-pay

## 2020-12-08 ENCOUNTER — Encounter (HOSPITAL_COMMUNITY)
Admission: RE | Admit: 2020-12-08 | Discharge: 2020-12-08 | Disposition: A | Discharge status: Home / Self Care | Payer: BC Managed Care – PPO | Source: Ambulatory Visit | Attending: Nephrology | Admitting: Nephrology

## 2020-12-08 ENCOUNTER — Telehealth: Payer: Self-pay | Admitting: Internal Medicine

## 2020-12-08 VITALS — BP 162/84 | HR 74 | Temp 97.3°F | Resp 18

## 2020-12-08 DIAGNOSIS — N1831 Chronic kidney disease, stage 3a: Secondary | ICD-10-CM | POA: Insufficient documentation

## 2020-12-08 DIAGNOSIS — D631 Anemia in chronic kidney disease: Secondary | ICD-10-CM | POA: Insufficient documentation

## 2020-12-08 DIAGNOSIS — R260 Ataxic gait: Secondary | ICD-10-CM | POA: Diagnosis not present

## 2020-12-08 LAB — FERRITIN: Ferritin: 150 ng/mL (ref 24–336)

## 2020-12-08 LAB — IRON AND TIBC
Iron: 59 ug/dL (ref 45–182)
Saturation Ratios: 26 % (ref 17.9–39.5)
TIBC: 225 ug/dL — ABNORMAL LOW (ref 250–450)
UIBC: 166 ug/dL

## 2020-12-08 LAB — POCT HEMOGLOBIN-HEMACUE: Hemoglobin: 11.3 g/dL — ABNORMAL LOW (ref 13.0–17.0)

## 2020-12-08 MED ORDER — EPOETIN ALFA-EPBX 10000 UNIT/ML IJ SOLN: 10000.0000 [IU] | INTRAMUSCULAR | Status: DC

## 2020-12-08 MED ORDER — EPOETIN ALFA-EPBX 10000 UNIT/ML IJ SOLN
INTRAMUSCULAR | Status: AC
  Administered 2020-12-08: 10000 [IU] via SUBCUTANEOUS
  Filled 2020-12-08: qty 1

## 2020-12-08 NOTE — Telephone Encounter (Signed)
Call returned to patient, confirmed DOB. Patient is requesting chest x-ray results. Made aware of results per MD Wert. Voiced understanding.  Nothing further needed at this time.

## 2020-12-13 ENCOUNTER — Telehealth: Payer: Self-pay

## 2020-12-13 ENCOUNTER — Encounter: Payer: Self-pay | Admitting: *Deleted

## 2020-12-13 NOTE — Progress Notes (Signed)
Letter mailed to the pt. 
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
## 2020-12-21 ENCOUNTER — Ambulatory Visit (INDEPENDENT_AMBULATORY_CARE_PROVIDER_SITE_OTHER): Payer: Medicare HMO | Admitting: Ophthalmology

## 2020-12-21 ENCOUNTER — Encounter (INDEPENDENT_AMBULATORY_CARE_PROVIDER_SITE_OTHER): Payer: Self-pay | Admitting: Ophthalmology

## 2020-12-21 ENCOUNTER — Other Ambulatory Visit: Payer: Self-pay

## 2020-12-21 DIAGNOSIS — H35033 Hypertensive retinopathy, bilateral: Secondary | ICD-10-CM | POA: Diagnosis not present

## 2020-12-21 DIAGNOSIS — E113411 Type 2 diabetes mellitus with severe nonproliferative diabetic retinopathy with macular edema, right eye: Secondary | ICD-10-CM

## 2020-12-21 DIAGNOSIS — I10 Essential (primary) hypertension: Secondary | ICD-10-CM | POA: Diagnosis not present

## 2020-12-21 DIAGNOSIS — H4312 Vitreous hemorrhage, left eye: Secondary | ICD-10-CM | POA: Diagnosis not present

## 2020-12-21 DIAGNOSIS — H25812 Combined forms of age-related cataract, left eye: Secondary | ICD-10-CM

## 2020-12-21 DIAGNOSIS — H43822 Vitreomacular adhesion, left eye: Secondary | ICD-10-CM | POA: Diagnosis not present

## 2020-12-21 DIAGNOSIS — Z961 Presence of intraocular lens: Secondary | ICD-10-CM | POA: Diagnosis not present

## 2020-12-21 DIAGNOSIS — E113512 Type 2 diabetes mellitus with proliferative diabetic retinopathy with macular edema, left eye: Secondary | ICD-10-CM

## 2020-12-21 DIAGNOSIS — H18513 Endothelial corneal dystrophy, bilateral: Secondary | ICD-10-CM

## 2020-12-21 DIAGNOSIS — H3581 Retinal edema: Secondary | ICD-10-CM

## 2020-12-21 DIAGNOSIS — H40003 Preglaucoma, unspecified, bilateral: Secondary | ICD-10-CM

## 2020-12-21 MED ORDER — BEVACIZUMAB CHEMO INJECTION 1.25MG/0.05ML SYRINGE FOR KALEIDOSCOPE
1.2500 mg | INTRAVITREAL | Status: AC | PRN
Start: 2020-12-21 — End: 2020-12-21
  Administered 2020-12-21: 1.25 mg via INTRAVITREAL

## 2020-12-22 ENCOUNTER — Encounter (HOSPITAL_COMMUNITY)
Admission: RE | Admit: 2020-12-22 | Discharge: 2020-12-22 | Disposition: A | Discharge status: Home / Self Care | Payer: BC Managed Care – PPO | Source: Ambulatory Visit | Attending: Nephrology | Admitting: Nephrology

## 2020-12-22 VITALS — BP 146/76 | HR 89 | Temp 97.0°F | Resp 18

## 2020-12-22 DIAGNOSIS — D631 Anemia in chronic kidney disease: Secondary | ICD-10-CM | POA: Diagnosis not present

## 2020-12-22 DIAGNOSIS — N1831 Chronic kidney disease, stage 3a: Secondary | ICD-10-CM

## 2020-12-22 LAB — POCT HEMOGLOBIN-HEMACUE: Hemoglobin: 11.8 g/dL — ABNORMAL LOW (ref 13.0–17.0)

## 2020-12-22 MED ORDER — EPOETIN ALFA-EPBX 10000 UNIT/ML IJ SOLN
10000.0000 [IU] | INTRAMUSCULAR | Status: DC
  Administered 2020-12-22: 10000 [IU] via SUBCUTANEOUS

## 2020-12-22 MED ORDER — EPOETIN ALFA-EPBX 10000 UNIT/ML IJ SOLN
INTRAMUSCULAR | Status: AC
  Filled 2020-12-22: qty 1

## 2020-12-27 DIAGNOSIS — R260 Ataxic gait: Secondary | ICD-10-CM | POA: Diagnosis not present

## 2021-01-01 DIAGNOSIS — H2512 Age-related nuclear cataract, left eye: Secondary | ICD-10-CM | POA: Diagnosis not present

## 2021-01-02 NOTE — Telephone Encounter (Signed)
Error

## 2021-01-03 DIAGNOSIS — R260 Ataxic gait: Secondary | ICD-10-CM | POA: Diagnosis not present

## 2021-01-04 ENCOUNTER — Other Ambulatory Visit: Payer: Self-pay

## 2021-01-04 ENCOUNTER — Encounter (INDEPENDENT_AMBULATORY_CARE_PROVIDER_SITE_OTHER): Payer: Self-pay | Admitting: Ophthalmology

## 2021-01-04 ENCOUNTER — Ambulatory Visit (INDEPENDENT_AMBULATORY_CARE_PROVIDER_SITE_OTHER): Payer: Medicare HMO | Admitting: Ophthalmology

## 2021-01-04 DIAGNOSIS — Z961 Presence of intraocular lens: Secondary | ICD-10-CM

## 2021-01-04 DIAGNOSIS — H43822 Vitreomacular adhesion, left eye: Secondary | ICD-10-CM | POA: Diagnosis not present

## 2021-01-04 DIAGNOSIS — H4312 Vitreous hemorrhage, left eye: Secondary | ICD-10-CM | POA: Diagnosis not present

## 2021-01-04 DIAGNOSIS — H25812 Combined forms of age-related cataract, left eye: Secondary | ICD-10-CM | POA: Diagnosis not present

## 2021-01-04 DIAGNOSIS — H3581 Retinal edema: Secondary | ICD-10-CM | POA: Diagnosis not present

## 2021-01-04 DIAGNOSIS — E113411 Type 2 diabetes mellitus with severe nonproliferative diabetic retinopathy with macular edema, right eye: Secondary | ICD-10-CM | POA: Diagnosis not present

## 2021-01-04 DIAGNOSIS — E113512 Type 2 diabetes mellitus with proliferative diabetic retinopathy with macular edema, left eye: Secondary | ICD-10-CM | POA: Diagnosis not present

## 2021-01-04 DIAGNOSIS — H40003 Preglaucoma, unspecified, bilateral: Secondary | ICD-10-CM

## 2021-01-04 DIAGNOSIS — H35033 Hypertensive retinopathy, bilateral: Secondary | ICD-10-CM

## 2021-01-04 DIAGNOSIS — I10 Essential (primary) hypertension: Secondary | ICD-10-CM

## 2021-01-04 DIAGNOSIS — H18513 Endothelial corneal dystrophy, bilateral: Secondary | ICD-10-CM
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
## 2021-01-05 DIAGNOSIS — H2512 Age-related nuclear cataract, left eye: Secondary | ICD-10-CM | POA: Diagnosis not present

## 2021-01-10 DIAGNOSIS — N2581 Secondary hyperparathyroidism of renal origin: Secondary | ICD-10-CM | POA: Diagnosis not present

## 2021-01-10 DIAGNOSIS — R260 Ataxic gait: Secondary | ICD-10-CM | POA: Diagnosis not present

## 2021-01-10 DIAGNOSIS — E1129 Type 2 diabetes mellitus with other diabetic kidney complication: Secondary | ICD-10-CM | POA: Diagnosis not present

## 2021-01-10 DIAGNOSIS — I129 Hypertensive chronic kidney disease with stage 1 through stage 4 chronic kidney disease, or unspecified chronic kidney disease: Secondary | ICD-10-CM | POA: Diagnosis not present

## 2021-01-10 DIAGNOSIS — I2510 Atherosclerotic heart disease of native coronary artery without angina pectoris: Secondary | ICD-10-CM | POA: Diagnosis not present

## 2021-01-10 DIAGNOSIS — E1122 Type 2 diabetes mellitus with diabetic chronic kidney disease: Secondary | ICD-10-CM | POA: Diagnosis not present

## 2021-01-10 DIAGNOSIS — R809 Proteinuria, unspecified: Secondary | ICD-10-CM | POA: Diagnosis not present

## 2021-01-10 DIAGNOSIS — N184 Chronic kidney disease, stage 4 (severe): Secondary | ICD-10-CM | POA: Diagnosis not present

## 2021-01-10 DIAGNOSIS — D472 Monoclonal gammopathy: Secondary | ICD-10-CM | POA: Diagnosis not present

## 2021-01-10 DIAGNOSIS — D631 Anemia in chronic kidney disease: Secondary | ICD-10-CM | POA: Diagnosis not present
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
## 2021-01-18 ENCOUNTER — Ambulatory Visit (INDEPENDENT_AMBULATORY_CARE_PROVIDER_SITE_OTHER): Payer: Medicare HMO | Admitting: Ophthalmology

## 2021-01-18 ENCOUNTER — Encounter (INDEPENDENT_AMBULATORY_CARE_PROVIDER_SITE_OTHER): Payer: Self-pay | Admitting: Ophthalmology

## 2021-01-18 ENCOUNTER — Other Ambulatory Visit: Payer: Self-pay

## 2021-01-18 DIAGNOSIS — H40003 Preglaucoma, unspecified, bilateral: Secondary | ICD-10-CM

## 2021-01-18 DIAGNOSIS — E113411 Type 2 diabetes mellitus with severe nonproliferative diabetic retinopathy with macular edema, right eye: Secondary | ICD-10-CM | POA: Diagnosis not present

## 2021-01-18 DIAGNOSIS — Z961 Presence of intraocular lens: Secondary | ICD-10-CM

## 2021-01-18 DIAGNOSIS — H4312 Vitreous hemorrhage, left eye: Secondary | ICD-10-CM | POA: Diagnosis not present

## 2021-01-18 DIAGNOSIS — H25812 Combined forms of age-related cataract, left eye: Secondary | ICD-10-CM

## 2021-01-18 DIAGNOSIS — H18513 Endothelial corneal dystrophy, bilateral: Secondary | ICD-10-CM

## 2021-01-18 DIAGNOSIS — E113512 Type 2 diabetes mellitus with proliferative diabetic retinopathy with macular edema, left eye: Secondary | ICD-10-CM

## 2021-01-18 DIAGNOSIS — H35033 Hypertensive retinopathy, bilateral: Secondary | ICD-10-CM

## 2021-01-18 DIAGNOSIS — H43822 Vitreomacular adhesion, left eye: Secondary | ICD-10-CM

## 2021-01-18 DIAGNOSIS — H3581 Retinal edema: Secondary | ICD-10-CM | POA: Diagnosis not present

## 2021-01-18 DIAGNOSIS — I10 Essential (primary) hypertension: Secondary | ICD-10-CM

## 2021-01-18 MED ORDER — BEVACIZUMAB CHEMO INJECTION 1.25MG/0.05ML SYRINGE FOR KALEIDOSCOPE
1.2500 mg | INTRAVITREAL | Status: AC | PRN
  Administered 2021-01-18: 1.25 mg via INTRAVITREAL

## 2021-01-19 ENCOUNTER — Encounter (HOSPITAL_COMMUNITY)
Admission: RE | Admit: 2021-01-19 | Discharge: 2021-01-19 | Disposition: A | Discharge status: Home / Self Care | Payer: BC Managed Care – PPO | Source: Ambulatory Visit | Attending: Nephrology | Admitting: Nephrology

## 2021-01-19 VITALS — BP 160/80 | HR 86 | Resp 18

## 2021-01-19 DIAGNOSIS — D631 Anemia in chronic kidney disease: Secondary | ICD-10-CM | POA: Diagnosis not present

## 2021-01-19 DIAGNOSIS — N1831 Chronic kidney disease, stage 3a: Secondary | ICD-10-CM | POA: Diagnosis not present

## 2021-01-19 LAB — IRON AND TIBC
Iron: 64 ug/dL (ref 45–182)
Saturation Ratios: 30 % (ref 17.9–39.5)
TIBC: 213 ug/dL — ABNORMAL LOW (ref 250–450)
UIBC: 149 ug/dL

## 2021-01-19 LAB — POCT HEMOGLOBIN-HEMACUE: Hemoglobin: 11.1 g/dL — ABNORMAL LOW (ref 13.0–17.0)

## 2021-01-19 LAB — FERRITIN: Ferritin: 254 ng/mL (ref 24–336)

## 2021-01-19 MED ORDER — EPOETIN ALFA-EPBX 10000 UNIT/ML IJ SOLN
10000.0000 [IU] | INTRAMUSCULAR | Status: DC
  Administered 2021-01-19: 10000 [IU] via SUBCUTANEOUS

## 2021-01-19 MED ORDER — EPOETIN ALFA-EPBX 10000 UNIT/ML IJ SOLN
INTRAMUSCULAR | Status: AC
  Filled 2021-01-19: qty 1

## 2021-01-20 ENCOUNTER — Ambulatory Visit: Payer: BC Managed Care – PPO | Admitting: Endocrinology

## 2021-01-30 ENCOUNTER — Ambulatory Visit: Payer: BC Managed Care – PPO | Admitting: Endocrinology

## 2021-01-31 ENCOUNTER — Ambulatory Visit (INDEPENDENT_AMBULATORY_CARE_PROVIDER_SITE_OTHER): Payer: BC Managed Care – PPO | Admitting: Endocrinology

## 2021-01-31 ENCOUNTER — Telehealth: Payer: Self-pay | Admitting: Endocrinology

## 2021-01-31 ENCOUNTER — Other Ambulatory Visit: Payer: Self-pay | Admitting: Endocrinology

## 2021-01-31 ENCOUNTER — Other Ambulatory Visit: Payer: Self-pay

## 2021-01-31 VITALS — BP 172/74 | HR 92 | Ht 75.0 in | Wt 207.2 lb

## 2021-01-31 DIAGNOSIS — E1122 Type 2 diabetes mellitus with diabetic chronic kidney disease: Secondary | ICD-10-CM

## 2021-01-31 DIAGNOSIS — Z794 Long term (current) use of insulin: Secondary | ICD-10-CM | POA: Diagnosis not present

## 2021-01-31 DIAGNOSIS — N1831 Chronic kidney disease, stage 3a: Secondary | ICD-10-CM | POA: Diagnosis not present

## 2021-01-31 LAB — POCT GLYCOSYLATED HEMOGLOBIN (HGB A1C): Hemoglobin A1C: 6.3 % — AB (ref 4.0–5.6)

## 2021-01-31 LAB — TSH: TSH: 2.25 u[IU]/mL (ref 0.35–4.50)

## 2021-01-31 MED ORDER — DEXCOM G6 RECEIVER DEVI: 1.0000 | Freq: Once | 1 refills | Status: AC

## 2021-01-31 MED ORDER — DEXCOM G6 TRANSMITTER MISC: 1.0000 | Freq: Once | 1 refills | Status: AC

## 2021-01-31 MED ORDER — DEXCOM G6 SENSOR MISC: 1.0000 | 3 refills | Status: DC

## 2021-01-31 MED ORDER — SILDENAFIL CITRATE 100 MG PO TABS: 100.0000 mg | ORAL_TABLET | Freq: Every day | ORAL | 11 refills | Status: DC | PRN

## 2021-01-31 MED ORDER — FREESTYLE LIBRE 14 DAY SENSOR MISC: 1.0000 | 3 refills | Status: DC
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
## 2021-01-31 NOTE — Patient Instructions (Addendum)
Your blood pressure is high today.  Please see your primary care provider soon, to have it rechecked Please continue the same insulin.   I have sent a prescription to your pharmacy, for the continuous glucose monitor Blood tests are requested for you today.  We'll let you know about the results.  Please come back for a follow-up appointment in 3 months.

## 2021-01-31 NOTE — Telephone Encounter (Signed)
Patient went to pharmacy after visit with provider today and was advised by the pharmacy that his insurance will not cover the [facility] but will cover the Dexcom.  Patient is requesting that we send Dexcom set up pharmacy for him.  Call back # [phone]

## 2021-02-01 ENCOUNTER — Emergency Department (HOSPITAL_COMMUNITY): Payer: BC Managed Care – PPO

## 2021-02-01 ENCOUNTER — Encounter: Payer: Self-pay | Admitting: Cardiology

## 2021-02-01 ENCOUNTER — Inpatient Hospital Stay (HOSPITAL_COMMUNITY)
Admission: EM | Admit: 2021-02-01 | Discharge: 2021-02-09 | DRG: 193 | Disposition: A | Discharge status: Home Health | Payer: BC Managed Care – PPO | Attending: Internal Medicine | Admitting: Internal Medicine

## 2021-02-01 ENCOUNTER — Ambulatory Visit: Payer: BC Managed Care – PPO | Admitting: Cardiology

## 2021-02-01 ENCOUNTER — Other Ambulatory Visit: Payer: Self-pay

## 2021-02-01 VITALS — BP 196/99 | HR 92 | Temp 98.4°F | Resp 16 | Ht 75.0 in | Wt 206.0 lb

## 2021-02-01 DIAGNOSIS — I5032 Chronic diastolic (congestive) heart failure: Secondary | ICD-10-CM | POA: Diagnosis not present

## 2021-02-01 DIAGNOSIS — G894 Chronic pain syndrome: Secondary | ICD-10-CM | POA: Diagnosis present

## 2021-02-01 DIAGNOSIS — Z96651 Presence of right artificial knee joint: Secondary | ICD-10-CM | POA: Diagnosis present

## 2021-02-01 DIAGNOSIS — R0602 Shortness of breath: Secondary | ICD-10-CM | POA: Diagnosis not present

## 2021-02-01 DIAGNOSIS — J189 Pneumonia, unspecified organism: Secondary | ICD-10-CM | POA: Diagnosis not present

## 2021-02-01 DIAGNOSIS — I10 Essential (primary) hypertension: Secondary | ICD-10-CM

## 2021-02-01 DIAGNOSIS — Z7901 Long term (current) use of anticoagulants: Secondary | ICD-10-CM

## 2021-02-01 DIAGNOSIS — R918 Other nonspecific abnormal finding of lung field: Secondary | ICD-10-CM | POA: Diagnosis not present

## 2021-02-01 DIAGNOSIS — J181 Lobar pneumonia, unspecified organism: Principal | ICD-10-CM | POA: Diagnosis present

## 2021-02-01 DIAGNOSIS — M810 Age-related osteoporosis without current pathological fracture: Secondary | ICD-10-CM | POA: Diagnosis present

## 2021-02-01 DIAGNOSIS — I132 Hypertensive heart and chronic kidney disease with heart failure and with stage 5 chronic kidney disease, or end stage renal disease: Secondary | ICD-10-CM | POA: Diagnosis present

## 2021-02-01 DIAGNOSIS — J9601 Acute respiratory failure with hypoxia: Secondary | ICD-10-CM | POA: Diagnosis present

## 2021-02-01 DIAGNOSIS — I2510 Atherosclerotic heart disease of native coronary artery without angina pectoris: Secondary | ICD-10-CM | POA: Diagnosis present

## 2021-02-01 DIAGNOSIS — J440 Chronic obstructive pulmonary disease with acute lower respiratory infection: Secondary | ICD-10-CM | POA: Diagnosis not present

## 2021-02-01 DIAGNOSIS — I739 Peripheral vascular disease, unspecified: Secondary | ICD-10-CM

## 2021-02-01 DIAGNOSIS — I252 Old myocardial infarction: Secondary | ICD-10-CM | POA: Diagnosis not present

## 2021-02-01 DIAGNOSIS — I509 Heart failure, unspecified: Secondary | ICD-10-CM | POA: Diagnosis not present

## 2021-02-01 DIAGNOSIS — M7989 Other specified soft tissue disorders: Secondary | ICD-10-CM | POA: Diagnosis present

## 2021-02-01 DIAGNOSIS — Z20822 Contact with and (suspected) exposure to covid-19: Secondary | ICD-10-CM | POA: Diagnosis not present

## 2021-02-01 DIAGNOSIS — Z83438 Family history of other disorder of lipoprotein metabolism and other lipidemia: Secondary | ICD-10-CM

## 2021-02-01 DIAGNOSIS — N189 Chronic kidney disease, unspecified: Secondary | ICD-10-CM | POA: Diagnosis not present

## 2021-02-01 DIAGNOSIS — R0902 Hypoxemia: Secondary | ICD-10-CM | POA: Diagnosis not present

## 2021-02-01 DIAGNOSIS — E785 Hyperlipidemia, unspecified: Secondary | ICD-10-CM | POA: Diagnosis present

## 2021-02-01 DIAGNOSIS — H409 Unspecified glaucoma: Secondary | ICD-10-CM | POA: Diagnosis present

## 2021-02-01 DIAGNOSIS — Z8249 Family history of ischemic heart disease and other diseases of the circulatory system: Secondary | ICD-10-CM

## 2021-02-01 DIAGNOSIS — N184 Chronic kidney disease, stage 4 (severe): Secondary | ICD-10-CM

## 2021-02-01 DIAGNOSIS — E1122 Type 2 diabetes mellitus with diabetic chronic kidney disease: Secondary | ICD-10-CM | POA: Diagnosis present

## 2021-02-01 DIAGNOSIS — J90 Pleural effusion, not elsewhere classified: Secondary | ICD-10-CM

## 2021-02-01 DIAGNOSIS — E875 Hyperkalemia: Secondary | ICD-10-CM | POA: Diagnosis not present

## 2021-02-01 DIAGNOSIS — I480 Paroxysmal atrial fibrillation: Secondary | ICD-10-CM | POA: Diagnosis present

## 2021-02-01 DIAGNOSIS — R609 Edema, unspecified: Secondary | ICD-10-CM | POA: Diagnosis not present

## 2021-02-01 DIAGNOSIS — I130 Hypertensive heart and chronic kidney disease with heart failure and stage 1 through stage 4 chronic kidney disease, or unspecified chronic kidney disease: Secondary | ICD-10-CM | POA: Diagnosis not present

## 2021-02-01 DIAGNOSIS — Z833 Family history of diabetes mellitus: Secondary | ICD-10-CM

## 2021-02-01 DIAGNOSIS — R140 Abdominal distension (gaseous): Secondary | ICD-10-CM | POA: Diagnosis not present

## 2021-02-01 DIAGNOSIS — Z87891 Personal history of nicotine dependence: Secondary | ICD-10-CM

## 2021-02-01 DIAGNOSIS — D631 Anemia in chronic kidney disease: Secondary | ICD-10-CM | POA: Diagnosis present

## 2021-02-01 DIAGNOSIS — M797 Fibromyalgia: Secondary | ICD-10-CM | POA: Diagnosis present

## 2021-02-01 DIAGNOSIS — K219 Gastro-esophageal reflux disease without esophagitis: Secondary | ICD-10-CM | POA: Diagnosis present

## 2021-02-01 DIAGNOSIS — R001 Bradycardia, unspecified: Secondary | ICD-10-CM | POA: Diagnosis not present

## 2021-02-01 DIAGNOSIS — N183 Chronic kidney disease, stage 3 unspecified: Secondary | ICD-10-CM

## 2021-02-01 DIAGNOSIS — Z794 Long term (current) use of insulin: Secondary | ICD-10-CM

## 2021-02-01 DIAGNOSIS — N186 End stage renal disease: Secondary | ICD-10-CM | POA: Diagnosis present

## 2021-02-01 DIAGNOSIS — N179 Acute kidney failure, unspecified: Secondary | ICD-10-CM | POA: Diagnosis present

## 2021-02-01 DIAGNOSIS — I5030 Unspecified diastolic (congestive) heart failure: Secondary | ICD-10-CM | POA: Diagnosis not present

## 2021-02-01 DIAGNOSIS — Z79899 Other long term (current) drug therapy: Secondary | ICD-10-CM

## 2021-02-01 LAB — BRAIN NATRIURETIC PEPTIDE: B Natriuretic Peptide: 2255.6 pg/mL — ABNORMAL HIGH (ref 0.0–100.0)

## 2021-02-01 LAB — RESP PANEL BY RT-PCR (FLU A&B, COVID) ARPGX2
Influenza A by PCR: NEGATIVE
Influenza B by PCR: NEGATIVE
SARS Coronavirus 2 by RT PCR: NEGATIVE

## 2021-02-01 LAB — CBC WITH DIFFERENTIAL/PLATELET
Abs Immature Granulocytes: 0.03 10*3/uL (ref 0.00–0.07)
Basophils Absolute: 0 10*3/uL (ref 0.0–0.1)
Basophils Relative: 0 %
Eosinophils Absolute: 0.1 10*3/uL (ref 0.0–0.5)
Eosinophils Relative: 1 %
HCT: 28.9 % — ABNORMAL LOW (ref 39.0–52.0)
Hemoglobin: 9.2 g/dL — ABNORMAL LOW (ref 13.0–17.0)
Immature Granulocytes: 0 %
Lymphocytes Relative: 4 %
Lymphs Abs: 0.3 10*3/uL — ABNORMAL LOW (ref 0.7–4.0)
MCH: 28.8 pg (ref 26.0–34.0)
MCHC: 31.8 g/dL (ref 30.0–36.0)
MCV: 90.6 fL (ref 80.0–100.0)
Monocytes Absolute: 0.5 10*3/uL (ref 0.1–1.0)
Monocytes Relative: 6 %
Neutro Abs: 7.4 10*3/uL (ref 1.7–7.7)
Neutrophils Relative %: 89 %
Platelets: 159 10*3/uL (ref 150–400)
RBC: 3.19 MIL/uL — ABNORMAL LOW (ref 4.22–5.81)
RDW: 15.9 % — ABNORMAL HIGH (ref 11.5–15.5)
WBC: 8.3 10*3/uL (ref 4.0–10.5)
nRBC: 0 % (ref 0.0–0.2)

## 2021-02-01 LAB — HEPATIC FUNCTION PANEL
ALT: 13 U/L (ref 0–44)
AST: 17 U/L (ref 15–41)
Albumin: 2.4 g/dL — ABNORMAL LOW (ref 3.5–5.0)
Alkaline Phosphatase: 76 U/L (ref 38–126)
Bilirubin, Direct: 0.1 mg/dL (ref 0.0–0.2)
Indirect Bilirubin: 0.6 mg/dL (ref 0.3–0.9)
Total Bilirubin: 0.7 mg/dL (ref 0.3–1.2)
Total Protein: 5.8 g/dL — ABNORMAL LOW (ref 6.5–8.1)

## 2021-02-01 LAB — CBG MONITORING, ED
Glucose-Capillary: 108 mg/dL — ABNORMAL HIGH (ref 70–99)
Glucose-Capillary: 183 mg/dL — ABNORMAL HIGH (ref 70–99)

## 2021-02-01 LAB — BASIC METABOLIC PANEL
Anion gap: 7 (ref 5–15)
BUN: 63 mg/dL — ABNORMAL HIGH (ref 8–23)
CO2: 22 mmol/L (ref 22–32)
Calcium: 7.7 mg/dL — ABNORMAL LOW (ref 8.9–10.3)
Chloride: 108 mmol/L (ref 98–111)
Creatinine, Ser: 5.49 mg/dL — ABNORMAL HIGH (ref 0.61–1.24)
GFR, Estimated: 10 mL/min — ABNORMAL LOW (ref >60)
Glucose, Bld: 125 mg/dL — ABNORMAL HIGH (ref 70–99)
Potassium: 5.1 mmol/L (ref 3.5–5.1)
Sodium: 137 mmol/L (ref 135–145)

## 2021-02-01 LAB — GLUCOSE, CAPILLARY: Glucose-Capillary: 110 mg/dL — ABNORMAL HIGH (ref 70–99)

## 2021-02-01 MED ORDER — NAPHAZOLINE-GLYCERIN 0.012-0.2 % OP SOLN
1.0000 [drp] | Freq: Four times a day (QID) | OPHTHALMIC | Status: DC | PRN
  Filled 2021-02-01: qty 15

## 2021-02-01 MED ORDER — ONDANSETRON HCL 4 MG PO TABS
4.0000 mg | ORAL_TABLET | Freq: Three times a day (TID) | ORAL | Status: DC | PRN
  Administered 2021-02-06: 4 mg via ORAL
  Filled 2021-02-01: qty 1

## 2021-02-01 MED ORDER — ORAL CARE MOUTH RINSE
15.0000 mL | Freq: Two times a day (BID) | OROMUCOSAL | Status: DC
  Administered 2021-02-01 – 2021-02-09 (×16): 15 mL via OROMUCOSAL

## 2021-02-01 MED ORDER — GABAPENTIN 600 MG PO TABS
300.0000 mg | ORAL_TABLET | Freq: Two times a day (BID) | ORAL | Status: DC
  Administered 2021-02-02 – 2021-02-09 (×15): 300 mg via ORAL
  Filled 2021-02-01: qty 1
  Filled 2021-02-01: qty 0.5
  Filled 2021-02-01 (×14): qty 1

## 2021-02-01 MED ORDER — ACETAMINOPHEN 325 MG PO TABS: 650.0000 mg | ORAL_TABLET | Freq: Three times a day (TID) | ORAL | Status: DC | PRN

## 2021-02-01 MED ORDER — PREDNISOLONE ACETATE 1 % OP SUSP
1.0000 [drp] | Freq: Every day | OPHTHALMIC | Status: DC | PRN
  Filled 2021-02-01: qty 5

## 2021-02-01 MED ORDER — KETOROLAC TROMETHAMINE 0.5 % OP SOLN
1.0000 [drp] | Freq: Four times a day (QID) | OPHTHALMIC | Status: DC | PRN
  Administered 2021-02-03 – 2021-02-05 (×2): 1 [drp] via OPHTHALMIC
  Filled 2021-02-01: qty 5

## 2021-02-01 MED ORDER — GUAIFENESIN ER 600 MG PO TB12
1200.0000 mg | ORAL_TABLET | Freq: Two times a day (BID) | ORAL | Status: DC
  Administered 2021-02-01 – 2021-02-09 (×16): 1200 mg via ORAL
  Filled 2021-02-01 (×16): qty 2

## 2021-02-01 MED ORDER — PANTOPRAZOLE SODIUM 40 MG PO TBEC
40.0000 mg | DELAYED_RELEASE_TABLET | Freq: Every day | ORAL | Status: DC
  Administered 2021-02-02 – 2021-02-09 (×8): 40 mg via ORAL
  Filled 2021-02-01 (×8): qty 1

## 2021-02-01 MED ORDER — ISOSORBIDE MONONITRATE ER 60 MG PO TB24
60.0000 mg | ORAL_TABLET | Freq: Every day | ORAL | Status: DC
  Administered 2021-02-02 – 2021-02-09 (×8): 60 mg via ORAL
  Filled 2021-02-01 (×8): qty 1

## 2021-02-01 MED ORDER — AMLODIPINE BESYLATE 10 MG PO TABS
10.0000 mg | ORAL_TABLET | Freq: Every day | ORAL | Status: DC
  Administered 2021-02-02 – 2021-02-04 (×3): 10 mg via ORAL
  Filled 2021-02-01: qty 1
  Filled 2021-02-01: qty 2
  Filled 2021-02-01: qty 1

## 2021-02-01 MED ORDER — MONTELUKAST SODIUM 10 MG PO TABS
10.0000 mg | ORAL_TABLET | Freq: Every day | ORAL | Status: DC
  Administered 2021-02-01 – 2021-02-08 (×8): 10 mg via ORAL
  Filled 2021-02-01 (×9): qty 1

## 2021-02-01 MED ORDER — CARVEDILOL 25 MG PO TABS
25.0000 mg | ORAL_TABLET | Freq: Two times a day (BID) | ORAL | Status: DC
  Administered 2021-02-01 – 2021-02-09 (×13): 25 mg via ORAL
  Filled 2021-02-01 (×9): qty 1
  Filled 2021-02-01: qty 4
  Filled 2021-02-01: qty 2
  Filled 2021-02-01 (×5): qty 1

## 2021-02-01 MED ORDER — IPRATROPIUM BROMIDE 0.02 % IN SOLN
0.5000 mg | Freq: Four times a day (QID) | RESPIRATORY_TRACT | Status: DC
  Administered 2021-02-01: 0.5 mg via RESPIRATORY_TRACT
  Filled 2021-02-01: qty 2.5

## 2021-02-01 MED ORDER — IPRATROPIUM BROMIDE 0.02 % IN SOLN
0.5000 mg | Freq: Two times a day (BID) | RESPIRATORY_TRACT | Status: DC
  Administered 2021-02-02 – 2021-02-04 (×6): 0.5 mg via RESPIRATORY_TRACT
  Filled 2021-02-01 (×7): qty 2.5

## 2021-02-01 MED ORDER — VITAMIN D 25 MCG (1000 UNIT) PO TABS
2000.0000 [IU] | ORAL_TABLET | Freq: Every day | ORAL | Status: DC
  Administered 2021-02-02 – 2021-02-09 (×8): 2000 [IU] via ORAL
  Filled 2021-02-01 (×8): qty 2

## 2021-02-01 MED ORDER — LINACLOTIDE 145 MCG PO CAPS
145.0000 ug | ORAL_CAPSULE | Freq: Every day | ORAL | Status: DC | PRN
  Filled 2021-02-01: qty 1

## 2021-02-01 MED ORDER — SODIUM CHLORIDE 0.9 % IV SOLN
1.0000 g | Freq: Once | INTRAVENOUS | Status: AC
  Administered 2021-02-01: 1 g via INTRAVENOUS
  Filled 2021-02-01: qty 10

## 2021-02-01 MED ORDER — OFLOXACIN 0.3 % OP SOLN
1.0000 [drp] | Freq: Every day | OPHTHALMIC | Status: DC
  Administered 2021-02-02 – 2021-02-09 (×8): 1 [drp] via OPHTHALMIC
  Filled 2021-02-01: qty 5

## 2021-02-01 MED ORDER — INSULIN ASPART 100 UNIT/ML ~~LOC~~ SOLN
0.0000 [IU] | Freq: Three times a day (TID) | SUBCUTANEOUS | Status: DC
  Administered 2021-02-01 – 2021-02-02 (×2): 2 [IU] via SUBCUTANEOUS
  Administered 2021-02-03: 1 [IU] via SUBCUTANEOUS
  Administered 2021-02-03: 2 [IU] via SUBCUTANEOUS
  Administered 2021-02-04 – 2021-02-05 (×4): 1 [IU] via SUBCUTANEOUS
  Administered 2021-02-05: 2 [IU] via SUBCUTANEOUS
  Administered 2021-02-05: 1 [IU] via SUBCUTANEOUS
  Administered 2021-02-06: 3 [IU] via SUBCUTANEOUS
  Administered 2021-02-06 – 2021-02-08 (×2): 1 [IU] via SUBCUTANEOUS
  Administered 2021-02-08: 2 [IU] via SUBCUTANEOUS
  Administered 2021-02-09: 1 [IU] via SUBCUTANEOUS
  Administered 2021-02-09: 2 [IU] via SUBCUTANEOUS
  Administered 2021-02-09: 5 [IU] via SUBCUTANEOUS

## 2021-02-01 MED ORDER — VITAMIN B-12 1000 MCG PO TABS
2000.0000 ug | ORAL_TABLET | Freq: Every day | ORAL | Status: DC
  Administered 2021-02-02 – 2021-02-09 (×8): 2000 ug via ORAL
  Filled 2021-02-01 (×8): qty 2

## 2021-02-01 MED ORDER — METHOCARBAMOL 500 MG PO TABS
500.0000 mg | ORAL_TABLET | Freq: Three times a day (TID) | ORAL | Status: DC | PRN
  Administered 2021-02-06: 500 mg via ORAL
  Filled 2021-02-01: qty 1

## 2021-02-01 MED ORDER — SODIUM CHLORIDE 0.9 % IV SOLN
2.0000 g | INTRAVENOUS | Status: AC
  Administered 2021-02-02 – 2021-02-05 (×4): 2 g via INTRAVENOUS
  Filled 2021-02-01 (×4): qty 20

## 2021-02-01 MED ORDER — HYDRALAZINE HCL 25 MG PO TABS: 50.0000 mg | ORAL_TABLET | Freq: Three times a day (TID) | ORAL | Status: DC

## 2021-02-01 MED ORDER — APIXABAN 5 MG PO TABS
5.0000 mg | ORAL_TABLET | Freq: Two times a day (BID) | ORAL | Status: DC
  Administered 2021-02-01 – 2021-02-09 (×15): 5 mg via ORAL
  Filled 2021-02-01 (×15): qty 1

## 2021-02-01 MED ORDER — SODIUM CHLORIDE 0.9 % IV SOLN
500.0000 mg | Freq: Once | INTRAVENOUS | Status: AC
  Administered 2021-02-01: 500 mg via INTRAVENOUS
  Filled 2021-02-01: qty 500

## 2021-02-01 MED ORDER — TAMSULOSIN HCL 0.4 MG PO CAPS
0.4000 mg | ORAL_CAPSULE | Freq: Every day | ORAL | Status: DC
  Administered 2021-02-01 – 2021-02-08 (×8): 0.4 mg via ORAL
  Filled 2021-02-01 (×9): qty 1

## 2021-02-01 MED ORDER — HYDRALAZINE HCL 50 MG PO TABS
100.0000 mg | ORAL_TABLET | Freq: Three times a day (TID) | ORAL | Status: DC
  Administered 2021-02-01 – 2021-02-09 (×22): 100 mg via ORAL
  Filled 2021-02-01 (×17): qty 2
  Filled 2021-02-01: qty 4
  Filled 2021-02-01 (×5): qty 2

## 2021-02-01 MED ORDER — ROSUVASTATIN CALCIUM 20 MG PO TABS
20.0000 mg | ORAL_TABLET | Freq: Every day | ORAL | Status: DC
  Administered 2021-02-01 – 2021-02-06 (×6): 20 mg via ORAL
  Filled 2021-02-01 (×6): qty 1

## 2021-02-01 MED ORDER — CALCITRIOL 0.25 MCG PO CAPS
0.2500 ug | ORAL_CAPSULE | ORAL | Status: DC
  Administered 2021-02-03 – 2021-02-08 (×3): 0.25 ug via ORAL
  Filled 2021-02-01 (×4): qty 1

## 2021-02-01 MED ORDER — AZITHROMYCIN 500 MG PO TABS
500.0000 mg | ORAL_TABLET | Freq: Every day | ORAL | Status: AC
  Administered 2021-02-02 – 2021-02-06 (×5): 500 mg via ORAL
  Filled 2021-02-01 (×5): qty 1

## 2021-02-01 MED ORDER — FUROSEMIDE 80 MG PO TABS
80.0000 mg | ORAL_TABLET | Freq: Three times a day (TID) | ORAL | Status: DC
  Administered 2021-02-01 – 2021-02-02 (×3): 80 mg via ORAL
  Filled 2021-02-01: qty 1
  Filled 2021-02-01: qty 4
  Filled 2021-02-01: qty 1
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
## 2021-02-01 NOTE — ED Notes (Signed)
Unable to obtain blood at this time. 

## 2021-02-01 NOTE — Plan of Care (Signed)
  Problem: Activity: Goal: Ability to tolerate increased activity will improve Outcome: Progressing   Problem: Clinical Measurements: Goal: Ability to maintain a body temperature in the normal range will improve Outcome: Progressing   Problem: Respiratory: Goal: Ability to maintain adequate ventilation will improve Outcome: Progressing Goal: Ability to maintain a clear airway will improve Outcome: Progressing   

## 2021-02-01 NOTE — ED Triage Notes (Signed)
Emergency Medicine Provider Triage Evaluation Note  William Webb [place] , a 70 y.o. male  was evaluated in triage.  Pt complains of SHOB, sent by cardiology office, O2 sat 85%, fistula but not on dialysis. Reports cough with pink frothy sputum x 1 event upon waking today, none since, edema, weakness. Is on eliquis.  Review of Systems  Positive: Shortness of breath, left abdominal pain (twinge), edema Negative: Chest pain, vomiting, fever  Physical Exam  There were no vitals taken for this visit. Gen:   Awake, no distress   HEENT:  Atraumatic  Resp:  Normal effort  Cardiac:  Normal rate  Abd:   Nondistended, mild TTP left mid abdomen MSK:   Moves extremities without difficulty , pitting edema bilateral lower extremities  Neuro:  Speech clear   Medical Decision Making  Medically screening exam initiated at 12:55 PM.  Appropriate orders placed.  William Webb [facility] was informed that the remainder of the evaluation will be completed by another provider, this initial triage assessment does not replace that evaluation, and the importance of remaining in the ED until their evaluation is complete.  Clinical Impression     William Learn, PA-C 02/01/21 1259

## 2021-02-01 NOTE — ED Triage Notes (Signed)
Pt BIB EMS from PCP due to sob,cp. Pt on RA was 85%. Pt is having swelling in legs and abd. Pt has fisula in left arm but is not on dialysis.

## 2021-02-01 NOTE — Progress Notes (Signed)
Patient is here for follow up visit.  Subjective:   William Webb [facility], male    DOB: 1951/07/15, 70 y.o.   MRN: [id number]   Chief Complaint  Patient presents with  . Dizziness  . Chest Pain  . Hypertension  . Follow-up    3 month  . Coronary Artery Disease  . PAD   HPI  70 y.o.African American male with hypertension, hyperlipidemia, type 2 DM, CAD, PAD, advanced CKD stage, h/o cardioversion for persistent Afib, fall (08/2020), small pericardial effusion, pulmonary hypertension   Patient was here for routine office visit today.  He has had subjective fever and chills, along with shortness of breath for last 2 3 days.  He has also had increasing orthopnea.  He was very dizzy and unstable on his feet in the office this morning.  He had left sided burning sensation on lower chest/upper abdomen for 2 hours, which has since resolved.    Current Outpatient Medications on File Prior to Visit  Medication Sig Dispense Refill  . acetaminophen (TYLENOL) 650 MG CR tablet Take 1,300 mg by mouth every 8 (eight) hours as needed for pain.    . Alcohol Swabs (B-D SINGLE USE SWABS REGULAR) PADS     . amLODipine (NORVASC) 10 MG tablet Take 1 tablet (10 mg total) by mouth daily. 90 tablet 3  . Blood Glucose Monitoring Suppl (ACCU-CHEK GUIDE ME) w/Device KIT 1 Device by Does not apply route 4 (four) times daily -  before meals and at bedtime. 1 kit 0  . calcitRIOL (ROCALTROL) 0.25 MCG capsule Take 0.25 mcg by mouth every Monday, Wednesday, and Friday.    . Cholecalciferol (VITAMIN D-3) 125 MCG (5000 UT) TABS Take 5,000 mcg by mouth daily.    . Continuous Blood Gluc Sensor (DEXCOM G6 SENSOR) MISC 1 Device by Does not apply route See admin instructions. Change every 10 days 9 each 3  . Cyanocobalamin 2500 MCG TABS Take 5,000 mcg by mouth daily. Vitamin b12    . Dextromethorphan-guaiFENesin (CORICIDIN HBP CONGESTION/COUGH) 10-200 MG CAPS Take 2 capsules by mouth daily as needed  (cough/congestion).    Marland Kitchen ELIQUIS 5 MG TABS tablet TAKE 1 TABLET BY MOUTH TWICE A DAY (Patient taking differently: Take 5 mg by mouth 2 (two) times daily.) 60 tablet 3  . Ensure (ENSURE) Take 237 mLs by mouth daily before lunch.     . furosemide (LASIX) 80 MG tablet Take 80 mg by mouth See admin instructions. Take 80 mg by mouth twice a day and additional 80 mg before bedtime as needed for swelling of the ankles   Patient states taking 71m 3x/day    . gabapentin (NEURONTIN) 600 MG tablet Take 0.5 tablets (300 mg total) by mouth 2 (two) times daily. (Patient taking differently: Take 300 mg by mouth 3 (three) times daily.) 60 tablet 0  . glucose 4 GM chewable tablet Chew 1 tablet by mouth as needed for low blood sugar.    . hydrALAZINE (APRESOLINE) 50 MG tablet     . isosorbide mononitrate (IMDUR) 60 MG 24 hr tablet Take 1 tablet (60 mg total) by mouth daily. 30 tablet 0  . ketorolac (ACULAR) 0.5 % ophthalmic solution daily as needed.    . linaclotide (LINZESS) 145 MCG CAPS capsule Take 1 capsule (145 mcg total) by mouth daily as needed (constipation). 30 capsule 0  . methocarbamol (ROBAXIN) 500 MG tablet     . montelukast (SINGULAIR) 10 MG tablet Take 1 tablet (10 mg total)  by mouth at bedtime. 90 tablet 3  . NOVOLOG FLEXPEN 100 UNIT/ML FlexPen INJECT 3 UNITS INTO THE SKIN 3 (THREE) TIMES DAILY WITH MEALS. (Patient taking differently: Inject 3-5 Units into the skin 3 (three) times daily with meals. As needed) 15 mL 11  . ofloxacin (OCUFLOX) 0.3 % ophthalmic solution daily as needed.    Marland Kitchen omeprazole (PRILOSEC) 20 MG capsule     . ondansetron (ZOFRAN) 4 MG tablet Take 1 tablet (4 mg total) by mouth every 8 (eight) hours as needed for nausea. 20 tablet 0  . prednisoLONE acetate (PRED FORTE) 1 % ophthalmic suspension daily as needed.    . rosuvastatin (CRESTOR) 20 MG tablet TAKE 1 TABLET (20 MG TOTAL) BY MOUTH DAILY AT 6 PM. (Patient taking differently: Take 20 mg by mouth at bedtime.) 90 tablet 1  .  sildenafil (VIAGRA) 100 MG tablet Take 1 tablet (100 mg total) by mouth daily as needed for erectile dysfunction. 10 tablet 11  . tamsulosin (FLOMAX) 0.4 MG CAPS capsule Take 0.4 mg by mouth at bedtime.    Sallye Lat (VISINE ADVANCED RELIEF) 0.05-0.1-1-1 % SOLN Place 1 drop into both eyes 3 (three) times daily as needed. Taking as needed per pt    . vitamin E (VITAMIN E) 1000 UNIT capsule Take 1,000 Units by mouth daily.     . carvedilol (COREG) 25 MG tablet Take 1 tablet (25 mg total) by mouth 2 (two) times daily. 180 tablet 1   No current facility-administered medications on file prior to visit.    Cardiovascular studies:  EKG 02/01/2021: Sinus rhythm 91 bpm Normal EKG  Mobile cardiac telemetry 13 days 09/19/2020 - 10/03/2020: Dominant rhythm: Sinus. HR 58-95 bpm. Avg HR 71 bpm, while in sinus rhythm. 33 episodes of SVT, fastest at 132 bpm for 5 beats, longest for 20 beats at 113 bpm. 4,4% isolated SVE, <1% couplet/triplets. 11 episodes of NSVT, fastest at 222 bpm for 7 beats, longest for 19 beats at 145 bpm. <1% isolated VE, couplet/triplets. No atrial fibrillation/atrial flutter//high grade AV block, sinus pause >3sec noted. 0 patient triggered events.     Lower Extremity Arterial Duplex 01/18/2020:  No hemodynamically significant stenosis noted in the bilateral common and  superficial femoral arteries.  Bilateral anterior and post tibial spectral waveform demonstrates a  monophasic flow pattern suggestive of severe diffuse small vessel disease.  This exam reveals normal perfusion of the right and left lower extremity  (ABI 0.97).   The ABI may be falsely elevated in a patient with arteriosclerosis.  Consider further work up for CLI if clinically indicated.  EKG 01/08/2020: Sinus rhythm 66 bpm. Left ventricular hypertrophy. ST-T changes related to LVH.  Coronary angiogram 10/20/2018: LM: Normal LAD: Distal 70% diffuse disease.        Ostial 90%  stenosis in Diag1. Patent prox Diag 1 stent LCx: Large OM1 with patent prior stent RCA: 100% occluded RPDA        Partially successful PTCA attempt        100%--99% stenosis        TIMI flow 0-I  LVEDP 16 mmHg  Echocardiogram 10/17/2018: Left ventricle: The cavity size was normal. Wall thickness was increased in a pattern of severe LVH. Systolic function was normal. The estimated ejection fraction was in the range of 50% to 55%. Wall motion was normal; there were no regional wall motion abnormalities. Doppler parameters are consistent with anormal left ventricular relaxation (grade 1 diastolic dysfunction). Doppler parameters are consistent with high ventricular  filling pressure. - Left atrium: The atrium was moderately dilated. - Right atrium: The atrium was mildly dilated.  Impressions: - Low normal to mildly reduced LV systolic function; EF 50; severe LVH; mild diastolic dysfunction; biatrial enlargement.  Recent Labs: 01/19/2021: Hb 11.1 HbA1C 6.3% TSH 2.2  10/27/2020: Hb 11.1  09/17/2020: Glucose 137, BUN/Cr 35/4.03. EGFR 15. Na/K 137/4.2.   08/02/2020: Glucose 155, BUN/Cr 35/4.1. EGFR 16. Na/K 137/3.6.  H/H 8.8/26.5. MCV 87. Platelets 177 HbA1C 7.3%   03/01/2020: Hemoglobin 11  Iron 60, TIBC 231.  01/06/2020: Glucose 173, BUN/Cr 43/2.9.  Na/K 137/4.8. Rest of the CMP normal  12/11/2019: Glucose 151. BUN/Cr 28/2.6. HbA1C 7.7%.   11//2020: Glucose 177, BUN/Cr 27/2.72. EGFR 23. Na/K 140/4.5. Albumin 2.4 low. Rest of the CMP normal H/H 8.9/27.8. MCV 86. Platelets 177 HbA1C 8.0%   02/2018: Chol 162, TG 95, HDL 37, LDL 105  Review of Systems  Constitutional: Negative for malaise/fatigue.  Cardiovascular: Positive for chest pain (Now resolved), dyspnea on exertion and leg swelling (Improved). Negative for palpitations and syncope.       LE ulcer   Respiratory: Positive for shortness of breath.   Genitourinary:       Erectile dysfunction  All other systems  reviewed and are negative.      Objective:    Vitals:   02/01/21 1129 02/01/21 1130  BP:    Pulse:    Resp:    Temp:    SpO2: 97% 90%   Vitals with BMI 02/01/2021 01/31/2021 01/19/2021  Height 6' 3"  6' 3"  -  Weight 206 lbs 207 lbs 3 oz -  BMI 34.91 79.1 -  Systolic 505 697 948  Diastolic 99 74 80  Pulse 92 92 86      Physical Exam Vitals and nursing note reviewed.  Constitutional:      General: He is not in acute distress. Neck:     Vascular: No JVD.  Cardiovascular:     Rate and Rhythm: Normal rate. Rhythm irregularly irregular.     Pulses:          Dorsalis pedis pulses are 0 on the right side and 0 on the left side.       Posterior tibial pulses are 0 on the right side and 0 on the left side.     Heart sounds: Murmur (Flow murmur likely due to LUE AV fistula) heard.      Comments: No ulcer, gangrene Pulmonary:     Effort: Pulmonary effort is normal.     Breath sounds: Normal breath sounds. No wheezing or rales.  Musculoskeletal:     Right lower leg: Edema (2+) present.     Left lower leg: Edema (2+) present.         Assessment & Recommendations:   70 y.o.African American male with hypertension, hyperlipidemia, type 2 DM, CAD, PAD, advanced CKD stage, h/o cardioversion for persistent Afib, fall (08/2020), small pericardial effusion, pulmonary hypertension   Hypoxia: Oxygen saturation 84% on room air, increased to 90% on 4 L oxygen.  Coupled with subjective fever, chills, as well as worsening dyspnea, I recommend emergency evaluation for other differential diagnoses, including but not limited to, acute on chronic HFpEF, pneumonia, hypertensive urgency etc EKG normal without any ischemic changes.  Suspicion for ACS syndrome.  PAD: He likely has severe PAD, especially below the knee. His ulcer has healed well, he does not have any claudication. He has advanced CKD, but not on dialysis yet. CO2 angiogram will likely be  inadequate to evaluate and intervene on  below the knee disease. In order to preserve his renal function, I would hold off contrast angiogram unless he develps critical limb ischemia (nonhealing ulcer, resting pain, gangrene). I counseled patient to check for any injuries on his feet on a daily basis. If any new wounds appears, he should contact us immediately.  Continue current medical therapy, including, statin Not on Aspirin due to ongoing use of eliquis and prior h/o GI bleeding.   Atrial fibrillation: H/o persistent, now in sinus rhythm s/p cardioversion 09/29/2019. CHA2DS2VASc score 4, annual stroke risk 5%. Continue eliquis 5 mg bid.  We will assist with application for patient assistance through manufacturer.   CAD: CAD s/p prior PCI, NSTEMI in 09/2018, attempted but unsuccessful. PTCA to chronically occluded Rt PDA Stable without angina symptoms. Given his advanced CKD, I do not recommend further revascualrization attempts.  Hypertension: Uncontrolled, primarily related to advanced CKD.  Management per nephrology.    CKD: Continue follow-up with nephrology  Mild bilateral carotid stenosis: Continue medical management  F/u in 4 weeks  Marcas Bowsher Esther Hardy, MD [facility] [place] Pager: [phone] Office: [phone] If no answer Cell [phone]

## 2021-02-01 NOTE — Telephone Encounter (Signed)
Please Advise

## 2021-02-01 NOTE — ED Notes (Signed)
Medications not verified by pharmacy at this time.
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
## 2021-02-02 ENCOUNTER — Inpatient Hospital Stay (HOSPITAL_COMMUNITY): Payer: BC Managed Care – PPO

## 2021-02-02 DIAGNOSIS — N179 Acute kidney failure, unspecified: Secondary | ICD-10-CM

## 2021-02-02 DIAGNOSIS — N189 Chronic kidney disease, unspecified: Secondary | ICD-10-CM

## 2021-02-02 LAB — GLUCOSE, CAPILLARY
Glucose-Capillary: 85 mg/dL (ref 70–99)
Glucose-Capillary: 119 mg/dL — ABNORMAL HIGH (ref 70–99)
Glucose-Capillary: 164 mg/dL — ABNORMAL HIGH (ref 70–99)
Glucose-Capillary: 169 mg/dL — ABNORMAL HIGH (ref 70–99)

## 2021-02-02 LAB — CBC
HCT: 23.5 % — ABNORMAL LOW (ref 39.0–52.0)
Hemoglobin: 7.5 g/dL — ABNORMAL LOW (ref 13.0–17.0)
MCH: 28.8 pg (ref 26.0–34.0)
MCHC: 31.9 g/dL (ref 30.0–36.0)
MCV: 90.4 fL (ref 80.0–100.0)
Platelets: 132 10*3/uL — ABNORMAL LOW (ref 150–400)
RBC: 2.6 MIL/uL — ABNORMAL LOW (ref 4.22–5.81)
RDW: 15.9 % — ABNORMAL HIGH (ref 11.5–15.5)
WBC: 4.8 10*3/uL (ref 4.0–10.5)
nRBC: 0 % (ref 0.0–0.2)

## 2021-02-02 LAB — BASIC METABOLIC PANEL
Anion gap: 6 (ref 5–15)
BUN: 65 mg/dL — ABNORMAL HIGH (ref 8–23)
CO2: 24 mmol/L (ref 22–32)
Calcium: 7.2 mg/dL — ABNORMAL LOW (ref 8.9–10.3)
Chloride: 107 mmol/L (ref 98–111)
Creatinine, Ser: 5.8 mg/dL — ABNORMAL HIGH (ref 0.61–1.24)
GFR, Estimated: 10 mL/min — ABNORMAL LOW (ref >60)
Glucose, Bld: 93 mg/dL (ref 70–99)
Potassium: 5.1 mmol/L (ref 3.5–5.1)
Sodium: 137 mmol/L (ref 135–145)

## 2021-02-02 LAB — FRUCTOSAMINE: Fructosamine: 254 umol/L (ref 205–285)

## 2021-02-02 LAB — HIV ANTIBODY (ROUTINE TESTING W REFLEX): HIV Screen 4th Generation wRfx: NONREACTIVE

## 2021-02-02 MED ORDER — DEXCOM G6 TRANSMITTER MISC: 1.0000 | Freq: Once | 1 refills | Status: AC

## 2021-02-02 MED ORDER — ENSURE ENLIVE PO LIQD
237.0000 mL | Freq: Two times a day (BID) | ORAL | Status: DC
  Administered 2021-02-02 – 2021-02-08 (×12): 237 mL via ORAL

## 2021-02-02 MED ORDER — SODIUM ZIRCONIUM CYCLOSILICATE 10 G PO PACK
10.0000 g | PACK | Freq: Once | ORAL | Status: AC
  Administered 2021-02-02: 10 g via ORAL
  Filled 2021-02-02: qty 1

## 2021-02-02 MED ORDER — DEXCOM G6 RECEIVER DEVI: 1.0000 | Freq: Once | 1 refills | Status: AC

## 2021-02-02 MED ORDER — DEXCOM G6 SENSOR MISC: 1.0000 | 3 refills | Status: DC

## 2021-02-02 NOTE — Progress Notes (Signed)
Initial Nutrition Assessment  DOCUMENTATION CODES:   Not applicable  INTERVENTION:    Ensure Enlive po BID, each supplement provides 350 kcal and 20 grams of protein  NUTRITION DIAGNOSIS:   Increased nutrient needs related to chronic illness (CHF, CKD) as evidenced by estimated needs.  GOAL:   Patient will meet greater than or equal to 90% of their needs  MONITOR:   PO intake,Supplement acceptance  REASON FOR ASSESSMENT:   Malnutrition Screening Tool    ASSESSMENT:   70 yo male admitted with cough, SOB r/t CAP. PMH includes CKD stage 4, chronic anemia, HTN, CHF, PAF, DM, HLD, CAD.   Currently on a renal carbohydrate modified diet with 1200 ml fluid restriction.  Meal intakes: 100% of breakfast today  Ensure once daily listed in home medications.   Patient reports that 1.5 months ago he lost from 225 lbs down to 177 lb within a month. During that time he was sleeping more than eating. He is now up to ~207-210 lbs which is where he would like to stay. He drinks Ensure at home 1-2 times per day. He has a good appetite now, and would like to continue the Ensure BID because this is what has been helping him maintain his weight.   Weight history reviewed and no significant weight loss noted. Current weight is up to 101.5 kg from 86.5 kg 6 months ago. Suspect patient's weight changes have been related to volume status with hx of CHF and CKD.   Labs reviewed.  CBG: 85 this morning  Medications reviewed and include calcitriol, vitamin D3, Lasix, Novolog SSI, Flomax, vitamin B-12, IV Rocephin.  NUTRITION - FOCUSED PHYSICAL EXAM:  unable to complete  Diet Order:   Diet Order            Diet renal/carb modified with fluid restriction Diet-HS Snack? Nothing; Fluid restriction: 1200 mL Fluid; Room service appropriate? Yes; Fluid consistency: Thin  Diet effective now                 EDUCATION NEEDS:   Not appropriate for education at this time  Skin:  Skin Assessment:  Reviewed RN Assessment  Last BM:  no BM documented  Height:   Ht Readings from Last 1 Encounters:  02/01/21 6\' 3"  (1.905 m)    Weight:   Wt Readings from Last 1 Encounters:  02/01/21 101.5 kg    Ideal Body Weight:  89.1 kg  BMI:  Body mass index is 27.97 kg/m.  Estimated Nutritional Needs:   Kcal:  2050-2250  Protein:  105-120 gm  Fluid:  ~2 L   Lucas Mallow, RD, LDN, CNSC Please refer to Amion for contact information.

## 2021-02-02 NOTE — Plan of Care (Signed)
  Problem: Activity: Goal: Ability to tolerate increased activity will improve Outcome: Progressing   Problem: Clinical Measurements: Goal: Ability to maintain a body temperature in the normal range will improve Outcome: Progressing   Problem: Respiratory: Goal: Ability to maintain adequate ventilation will improve Outcome: Progressing Goal: Ability to maintain a clear airway will improve Outcome: Progressing   
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
## 2021-02-02 NOTE — Telephone Encounter (Signed)
OK, I have sent a prescription to your pharmacy.  

## 2021-02-03 ENCOUNTER — Inpatient Hospital Stay (HOSPITAL_COMMUNITY): Payer: BC Managed Care – PPO

## 2021-02-03 DIAGNOSIS — J90 Pleural effusion, not elsewhere classified: Secondary | ICD-10-CM

## 2021-02-03 DIAGNOSIS — R001 Bradycardia, unspecified: Secondary | ICD-10-CM | POA: Diagnosis not present

## 2021-02-03 HISTORY — PX: IR THORACENTESIS ASP PLEURAL SPACE W/IMG GUIDE: IMG5380

## 2021-02-03 LAB — CBC
HCT: 22.4 % — ABNORMAL LOW (ref 39.0–52.0)
Hemoglobin: 7.2 g/dL — ABNORMAL LOW (ref 13.0–17.0)
MCH: 29 pg (ref 26.0–34.0)
MCHC: 32.1 g/dL (ref 30.0–36.0)
MCV: 90.3 fL (ref 80.0–100.0)
Platelets: 143 10*3/uL — ABNORMAL LOW (ref 150–400)
RBC: 2.48 MIL/uL — ABNORMAL LOW (ref 4.22–5.81)
RDW: 15.8 % — ABNORMAL HIGH (ref 11.5–15.5)
WBC: 3.8 10*3/uL — ABNORMAL LOW (ref 4.0–10.5)
nRBC: 0 % (ref 0.0–0.2)

## 2021-02-03 LAB — BODY FLUID CELL COUNT WITH DIFFERENTIAL
Eos, Fluid: 0 %
Lymphs, Fluid: 48 %
Monocyte-Macrophage-Serous Fluid: 32 % — ABNORMAL LOW (ref 50–90)
Neutrophil Count, Fluid: 20 % (ref 0–25)
Total Nucleated Cell Count, Fluid: 30 cu mm (ref 0–1000)

## 2021-02-03 LAB — GLUCOSE, CAPILLARY
Glucose-Capillary: 126 mg/dL — ABNORMAL HIGH (ref 70–99)
Glucose-Capillary: 109 mg/dL — ABNORMAL HIGH (ref 70–99)
Glucose-Capillary: 197 mg/dL — ABNORMAL HIGH (ref 70–99)
Glucose-Capillary: 124 mg/dL — ABNORMAL HIGH (ref 70–99)

## 2021-02-03 LAB — BASIC METABOLIC PANEL
Anion gap: 7 (ref 5–15)
BUN: 68 mg/dL — ABNORMAL HIGH (ref 8–23)
CO2: 24 mmol/L (ref 22–32)
Calcium: 7.4 mg/dL — ABNORMAL LOW (ref 8.9–10.3)
Chloride: 104 mmol/L (ref 98–111)
Creatinine, Ser: 6.05 mg/dL — ABNORMAL HIGH (ref 0.61–1.24)
GFR, Estimated: 9 mL/min — ABNORMAL LOW (ref >60)
Glucose, Bld: 152 mg/dL — ABNORMAL HIGH (ref 70–99)
Potassium: 4.7 mmol/L (ref 3.5–5.1)
Sodium: 135 mmol/L (ref 135–145)

## 2021-02-03 LAB — BRAIN NATRIURETIC PEPTIDE: B Natriuretic Peptide: 1353.5 pg/mL — ABNORMAL HIGH (ref 0.0–100.0)

## 2021-02-03 LAB — PROTEIN, PLEURAL OR PERITONEAL FLUID: Total protein, fluid: <3 g/dL

## 2021-02-03 LAB — GRAM STAIN

## 2021-02-03 LAB — LACTATE DEHYDROGENASE, PLEURAL OR PERITONEAL FLUID: LD, Fluid: 36 U/L — ABNORMAL HIGH (ref 3–23)

## 2021-02-03 LAB — STREP PNEUMONIAE URINARY ANTIGEN: Strep Pneumo Urinary Antigen: NEGATIVE

## 2021-02-03 MED ORDER — LIDOCAINE HCL 1 % IJ SOLN
INTRAMUSCULAR | Status: AC
  Filled 2021-02-03: qty 20

## 2021-02-03 MED ORDER — FUROSEMIDE 40 MG PO TABS
40.0000 mg | ORAL_TABLET | Freq: Every day | ORAL | Status: DC
  Administered 2021-02-04 – 2021-02-05 (×2): 40 mg via ORAL
  Filled 2021-02-03 (×2): qty 1
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
## 2021-02-04 LAB — CBC
HCT: 22 % — ABNORMAL LOW (ref 39.0–52.0)
Hemoglobin: 7.2 g/dL — ABNORMAL LOW (ref 13.0–17.0)
MCH: 29.5 pg (ref 26.0–34.0)
MCHC: 32.7 g/dL (ref 30.0–36.0)
MCV: 90.2 fL (ref 80.0–100.0)
Platelets: 161 10*3/uL (ref 150–400)
RBC: 2.44 MIL/uL — ABNORMAL LOW (ref 4.22–5.81)
RDW: 15.7 % — ABNORMAL HIGH (ref 11.5–15.5)
WBC: 4.4 10*3/uL (ref 4.0–10.5)
nRBC: 0 % (ref 0.0–0.2)

## 2021-02-04 LAB — BASIC METABOLIC PANEL
Anion gap: 9 (ref 5–15)
BUN: 70 mg/dL — ABNORMAL HIGH (ref 8–23)
CO2: 21 mmol/L — ABNORMAL LOW (ref 22–32)
Calcium: 7.6 mg/dL — ABNORMAL LOW (ref 8.9–10.3)
Chloride: 105 mmol/L (ref 98–111)
Creatinine, Ser: 5.7 mg/dL — ABNORMAL HIGH (ref 0.61–1.24)
GFR, Estimated: 10 mL/min — ABNORMAL LOW (ref >60)
Glucose, Bld: 146 mg/dL — ABNORMAL HIGH (ref 70–99)
Potassium: 4.6 mmol/L (ref 3.5–5.1)
Sodium: 135 mmol/L (ref 135–145)

## 2021-02-04 LAB — GLUCOSE, CAPILLARY
Glucose-Capillary: 124 mg/dL — ABNORMAL HIGH (ref 70–99)
Glucose-Capillary: 156 mg/dL — ABNORMAL HIGH (ref 70–99)
Glucose-Capillary: 150 mg/dL — ABNORMAL HIGH (ref 70–99)
Glucose-Capillary: 120 mg/dL — ABNORMAL HIGH (ref 70–99)

## 2021-02-04 LAB — URINALYSIS, ROUTINE W REFLEX MICROSCOPIC
Bilirubin Urine: NEGATIVE
Glucose, UA: 50 mg/dL — AB
Hgb urine dipstick: NEGATIVE
Ketones, ur: NEGATIVE mg/dL
Leukocytes,Ua: NEGATIVE
Nitrite: NEGATIVE
Protein, ur: >=300 mg/dL — AB
Specific Gravity, Urine: 1.012 (ref 1.005–1.030)
pH: 5 (ref 5.0–8.0)

## 2021-02-04 LAB — PROTEIN, TOTAL: Total Protein: 5.3 g/dL — ABNORMAL LOW (ref 6.5–8.1)

## 2021-02-04 LAB — LEGIONELLA PNEUMOPHILA SEROGP 1 UR AG: L. pneumophila Serogp 1 Ur Ag: NEGATIVE

## 2021-02-04 LAB — LACTATE DEHYDROGENASE: LDH: 189 U/L (ref 98–192)

## 2021-02-04 MED ORDER — POLYETHYLENE GLYCOL 3350 17 G PO PACK
34.0000 g | PACK | Freq: Once | ORAL | Status: AC
  Administered 2021-02-04: 34 g via ORAL
  Filled 2021-02-04: qty 2

## 2021-02-04 MED ORDER — POLYETHYLENE GLYCOL 3350 17 G PO PACK
17.0000 g | PACK | Freq: Once | ORAL | Status: AC
  Administered 2021-02-04: 17 g via ORAL
  Filled 2021-02-04: qty 1

## 2021-02-04 MED ORDER — BISACODYL 5 MG PO TBEC
10.0000 mg | DELAYED_RELEASE_TABLET | Freq: Once | ORAL | Status: AC
  Administered 2021-02-04: 10 mg via ORAL
  Filled 2021-02-04: qty 2

## 2021-02-04 NOTE — Progress Notes (Signed)
PT Cancellation Note  Patient Details Name: William Webb [facility] MRN: [id number] DOB: 1951/08/22   Cancelled Treatment:    Reason Eval/Treat Not Completed: Medical issues which prohibited therapy. Patient with BP of 133/100 and is trying to have BM. Sitting on bedside commode. Will re-attempt later when patient available.     Husain Costabile 02/04/2021, 2:31 PM
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
## 2021-02-05 ENCOUNTER — Encounter (HOSPITAL_COMMUNITY): Payer: Self-pay | Admitting: Internal Medicine

## 2021-02-05 LAB — BASIC METABOLIC PANEL
Anion gap: 9 (ref 5–15)
BUN: 79 mg/dL — ABNORMAL HIGH (ref 8–23)
CO2: 21 mmol/L — ABNORMAL LOW (ref 22–32)
Calcium: 7.8 mg/dL — ABNORMAL LOW (ref 8.9–10.3)
Chloride: 105 mmol/L (ref 98–111)
Creatinine, Ser: 5.91 mg/dL — ABNORMAL HIGH (ref 0.61–1.24)
GFR, Estimated: 10 mL/min — ABNORMAL LOW (ref >60)
Glucose, Bld: 122 mg/dL — ABNORMAL HIGH (ref 70–99)
Potassium: 5.4 mmol/L — ABNORMAL HIGH (ref 3.5–5.1)
Sodium: 135 mmol/L (ref 135–145)

## 2021-02-05 LAB — GLUCOSE, CAPILLARY
Glucose-Capillary: 121 mg/dL — ABNORMAL HIGH (ref 70–99)
Glucose-Capillary: 132 mg/dL — ABNORMAL HIGH (ref 70–99)
Glucose-Capillary: 151 mg/dL — ABNORMAL HIGH (ref 70–99)
Glucose-Capillary: 123 mg/dL — ABNORMAL HIGH (ref 70–99)

## 2021-02-05 LAB — IRON AND TIBC
Iron: 38 ug/dL — ABNORMAL LOW (ref 45–182)
Saturation Ratios: 19 % (ref 17.9–39.5)
TIBC: 197 ug/dL — ABNORMAL LOW (ref 250–450)
UIBC: 159 ug/dL

## 2021-02-05 LAB — CBC
HCT: 23.4 % — ABNORMAL LOW (ref 39.0–52.0)
Hemoglobin: 7.4 g/dL — ABNORMAL LOW (ref 13.0–17.0)
MCH: 29 pg (ref 26.0–34.0)
MCHC: 31.6 g/dL (ref 30.0–36.0)
MCV: 91.8 fL (ref 80.0–100.0)
Platelets: 179 10*3/uL (ref 150–400)
RBC: 2.55 MIL/uL — ABNORMAL LOW (ref 4.22–5.81)
RDW: 15.7 % — ABNORMAL HIGH (ref 11.5–15.5)
WBC: 4.7 10*3/uL (ref 4.0–10.5)
nRBC: 0 % (ref 0.0–0.2)

## 2021-02-05 MED ORDER — DARBEPOETIN ALFA 100 MCG/0.5ML IJ SOSY
100.0000 ug | PREFILLED_SYRINGE | INTRAMUSCULAR | Status: DC
Start: 2021-02-05 — End: 2021-02-08
  Administered 2021-02-05: 100 ug via SUBCUTANEOUS
  Filled 2021-02-05: qty 0.5

## 2021-02-05 MED ORDER — POLYETHYLENE GLYCOL 3350 17 G PO PACK
34.0000 g | PACK | Freq: Once | ORAL | Status: AC
  Administered 2021-02-05: 34 g via ORAL
  Filled 2021-02-05: qty 2

## 2021-02-05 MED ORDER — SODIUM ZIRCONIUM CYCLOSILICATE 10 G PO PACK
10.0000 g | PACK | Freq: Once | ORAL | Status: AC
  Administered 2021-02-05: 10 g via ORAL
  Filled 2021-02-05: qty 1

## 2021-02-05 MED ORDER — BISACODYL 5 MG PO TBEC: 5.0000 mg | DELAYED_RELEASE_TABLET | Freq: Every day | ORAL | Status: DC | PRN

## 2021-02-05 MED ORDER — IPRATROPIUM BROMIDE 0.02 % IN SOLN: 0.5000 mg | Freq: Four times a day (QID) | RESPIRATORY_TRACT | Status: DC | PRN

## 2021-02-05 NOTE — Evaluation (Signed)
Physical Therapy Evaluation Patient Details Name: William Webb [facility] MRN: [id number] DOB: [birth year] Today's Date: 02/05/2021   History of Present Illness  William Webb is a 70 y.o. male presented with acute onset of cough and shortness of breath; with work-up significant for pneumonia, recurrent right pleural effusion, and worsening renal function, Creatinine 5.4 compared to 4.1-4.3 baseline; PMH of  significant of chronic diastolic CHF, CKD stage IV, HTN, IDDM, PAF on Eliquis, DM neuropathy  Clinical Impression   Pt admitted with above diagnosis. Lives alone in a single level apartmetn; completely independent, including driving prior to admission; Presents to PT with generalized weakness, O2 sat decr with ambulation on room air; I anticipate good progress as his medical status improves;  Pt currently with functional limitations due to the deficits listed below (see PT Problem List). Pt will benefit from skilled PT to increase their independence and safety with mobility to allow discharge to the venue listed below.       Follow Up Recommendations Home health PT    Equipment Recommendations  None recommended by PT (well-equipped)    Recommendations for Other Services       Precautions / Restrictions Precautions Precautions: Fall Precaution Comments: Mild fall risk; tends to reach out for UE support      Mobility  Bed Mobility Overal bed mobility: Modified Independent             General bed mobility comments: Slow moving, but no assist needed    Transfers Overall transfer level: Needs assistance Equipment used: None Transfers: Sit to/from Stand Sit to Stand: Min guard         General transfer comment: Noteable use of UEs to push up and steady himself  Ambulation/Gait Ambulation/Gait assistance: Min guard (with and without physical contact) Gait Distance (Feet): 150 Feet Assistive device: None (but tending to reach out for hallwya rail  or desk) Gait  Pattern/deviations: Step-through pattern Gait velocity: slowed   General Gait Details: Cues to self-monitor for activity tolerance  Stairs            Wheelchair Mobility    Modified Rankin (Stroke Patients Only)       Balance Overall balance assessment: Mild deficits observed, not formally tested                                           Pertinent Vitals/Pain Pain Assessment: No/denies pain (No pain during session, but pt reports neuropathic pain in feet and OA pain at times)    Home Living Family/patient expects to be discharged to:: Private residence Living Arrangements: Alone Available Help at Discharge: Family;Available PRN/intermittently Type of Home: Apartment Home Access: Stairs to enter   Entrance Stairs-Number of Steps: 1 Home Layout: One level Home Equipment: Grab bars - tub/shower;Shower seat;Walker - 2 wheels;Cane - single point;Bedside commode      Prior Function Level of Independence: Independent         Comments: Reports independent with driving, adls, and IADLs.     Hand Dominance        Extremity/Trunk Assessment   Upper Extremity Assessment Upper Extremity Assessment: Overall WFL for tasks assessed    Lower Extremity Assessment Lower Extremity Assessment: Generalized weakness       Communication   Communication: No difficulties  Cognition Arousal/Alertness: Awake/alert Behavior During Therapy: WFL for tasks assessed/performed Overall Cognitive Status: Within Functional Limits for tasks  assessed                                        General Comments General comments (skin integrity, edema, etc.): See other PT note of this date    Exercises     Assessment/Plan    PT Assessment Patient needs continued PT services  PT Problem List Decreased strength;Decreased activity tolerance;Decreased balance;Decreased mobility;Cardiopulmonary status limiting activity;Decreased knowledge of  precautions       PT Treatment Interventions      PT Goals (Current goals can be found in the Care Plan section)  Acute Rehab PT Goals Patient Stated Goal: Wants to get fully well before getting home PT Goal Formulation: With patient Time For Goal Achievement: 02/19/21 Potential to Achieve Goals: Good    Frequency Min 3X/week   Barriers to discharge        Co-evaluation               AM-PAC PT "6 Clicks" Mobility  Outcome Measure Help needed turning from your back to your side while in a flat bed without using bedrails?: None Help needed moving from lying on your back to sitting on the side of a flat bed without using bedrails?: None Help needed moving to and from a bed to a chair (including a wheelchair)?: None Help needed standing up from a chair using your arms (e.g., wheelchair or bedside chair)?: A Little Help needed to walk in hospital room?: A Little Help needed climbing 3-5 steps with a railing? : A Little 6 Click Score: 21    End of Session Equipment Utilized During Treatment: Gait belt;Oxygen Activity Tolerance: Patient tolerated treatment well Patient left: in bed;with call bell/phone within reach;with bed alarm set Nurse Communication: Mobility status PT Visit Diagnosis: Other abnormalities of gait and mobility (R26.89)    Time: 2778-2423 PT Time Calculation (min) (ACUTE ONLY): 35 min   Charges:   PT Evaluation $PT Eval Moderate Complexity: 1 Mod PT Treatments $Gait Training: 8-22 mins        Roney Marion, [place] Pager [phone] [facility] 02/05/2021, 12:30 PM

## 2021-02-05 NOTE — Progress Notes (Signed)
[facility] Progress Note    Assessment/ Plan:   1.  AKI on CKD IV: pt's baseline Cr is 4.0.  Now up to 6 in the setting of pneumonia and a little bit of volume overload.  Doesn't appear to have been hypotensive.  Likely septic method of injury.  Plan: - supportive care - daily weights, I/O - started Lasix back on 4/9- 40 mg PO daily, home dose is 80 TID  - no indication for dialysis yet- discussed that pt may need it if things don't turn around--> has AVF ready for use - follow closely  2.  R pleural effusion - thoracentesis today--> had 2.2L out! - fluid studies appear transudative, gm stain few WBCs, culture negative  3.  DCHF: - small amount of volume on board - Lasix as above  4.  HTN: - home meds - reasonably well controlled  5.  Dispo: admitted  Subjective:    Seen in room.  Cr up to 5.9, Lasix 40 resumed yesterday.  No complaints.     Objective:   BP (!) 146/66 (BP Location: Right Arm)   Pulse 66   Temp 98.1 F (36.7 C) (Oral)   Resp 18   Ht 6\' 3"  (1.905 m)   Wt 95.3 kg   SpO2 95%   BMI 26.26 kg/m   Intake/Output Summary (Last 24 hours) at 02/05/2021 1138 Last data filed at 02/05/2021 0900 Gross per 24 hour  Intake 120 ml  Output 525 ml  Net -405 ml   Weight change: -1.799 kg  Physical Exam: NAD, sitting in bed, NAD HEENT EOMI PERRL  NECK no JVD PULM better air movement today CV RRR ABD soft EXT trace LE edema NEURO AAO x 3 nonfocal ACCESS: L AVF + T/B  Imaging: DG Chest 1 View  Result Date: 02/03/2021 CLINICAL DATA:  Status post right thoracentesis. EXAM: CHEST  1 VIEW COMPARISON:  February 01, 2021. FINDINGS: Stable cardiomediastinal silhouette. No pneumothorax is noted. Minimal right pleural effusion may be present. Improved left lung opacity is noted. IMPRESSION: No pneumothorax is noted status post right thoracentesis. Minimal right pleural effusion is noted. Electronically Signed   By: Marijo Conception M.D.   On: 02/03/2021  12:59   US RENAL  Result Date: 02/03/2021 CLINICAL DATA:  Acute renal injury with stage IV chronic renal disease. EXAM: RENAL / URINARY TRACT ULTRASOUND COMPLETE COMPARISON:  None. FINDINGS: Right Kidney: Renal measurements: 10.7 cm x 5.0 cm x 7.1 cm = volume: 195.7 mL. Diffusely increased echogenicity of the renal parenchyma is noted. 1.4 cm x 0.9 cm x 1.4 cm and 1.3 cm x 1.1 cm x 1.3 cm anechoic areas are seen within the upper pole of the right kidney. No abnormal flow is seen within this region on color Doppler evaluation. No hydronephrosis is visualized. Left Kidney: Renal measurements: 10.6 cm x 5.7 cm x 5.3 cm = volume: 168.1 mL. Diffusely increased echogenicity of the renal parenchyma is noted. No mass or hydronephrosis visualized. Bladder: Poorly distended and subsequently limited in evaluation. Other: The prostate gland measures 4.2 cm x 3.3 cm x 5.8 cm (42.2 cc). IMPRESSION: 1. Diffusely increased echogenicity of the renal parenchyma, which is likely secondary to medical renal disease. 2. Multiple simple cyst within the right kidney. Electronically Signed   By: Virgina Norfolk M.D.   On: 02/03/2021 21:59   IR THORACENTESIS ASP PLEURAL SPACE W/IMG GUIDE  Result Date: 02/03/2021 INDICATION: Congestive heart failure with right pleural effusion. Request for diagnostic  and therapeutic thoracentesis. EXAM: ULTRASOUND GUIDED RIGHT THORACENTESIS MEDICATIONS: 1% lidocaine 10 mL COMPLICATIONS: None immediate. PROCEDURE: An ultrasound guided thoracentesis was thoroughly discussed with the patient and questions answered. The benefits, risks, alternatives and complications were also discussed. The patient understands and wishes to proceed with the procedure. Written consent was obtained. Ultrasound was performed to localize and mark an adequate pocket of fluid in the right chest. The area was then prepped and draped in the normal sterile fashion. 1% Lidocaine was used for local anesthesia. Under ultrasound  guidance a 6 Fr Safe-T-Centesis catheter was introduced. Thoracentesis was performed. The catheter was removed and a dressing applied. FINDINGS: A total of approximately 2.2 L of clear yellow fluid was removed. Samples were sent to the laboratory as requested by the clinical team. IMPRESSION: Successful ultrasound guided right thoracentesis yielding 2.2 L of pleural fluid. No pneumothorax on post-procedure chest x-ray. Read by: Gareth Eagle, PA-C Electronically Signed   By: Corrie Mckusick D.O.   On: 02/03/2021 13:27    Labs: BMET Recent Labs  Lab 02/01/21 1300 02/02/21 0720 02/03/21 0147 02/04/21 0026 02/05/21 0653  NA 137 137 135 135 135  K 5.1 5.1 4.7 4.6 5.4*  CL 108 107 104 105 105  CO2 22 24 24  21* 21*  GLUCOSE 125* 93 152* 146* 122*  BUN 63* 65* 68* 70* 79*  CREATININE 5.49* 5.80* 6.05* 5.70* 5.91*  CALCIUM 7.7* 7.2* 7.4* 7.6* 7.8*   CBC Recent Labs  Lab 02/01/21 1300 02/02/21 0720 02/03/21 0147 02/04/21 0026 02/05/21 0653  WBC 8.3 4.8 3.8* 4.4 4.7  NEUTROABS 7.4  --   --   --   --   HGB 9.2* 7.5* 7.2* 7.2* 7.4*  HCT 28.9* 23.5* 22.4* 22.0* 23.4*  MCV 90.6 90.4 90.3 90.2 91.8  PLT 159 132* 143* 161 179    Medications:    . apixaban  5 mg Oral BID  . azithromycin  500 mg Oral Daily  . calcitRIOL  0.25 mcg Oral Q M,W,F  . carvedilol  25 mg Oral BID  . cholecalciferol  2,000 Units Oral Daily  . darbepoetin (ARANESP) injection - NON-DIALYSIS  100 mcg Subcutaneous Q Sun-1800  . feeding supplement  237 mL Oral BID BM  . furosemide  40 mg Oral Daily  . gabapentin  300 mg Oral BID  . guaiFENesin  1,200 mg Oral BID  . hydrALAZINE  100 mg Oral Q8H  . insulin aspart  0-9 Units Subcutaneous TID WC  . isosorbide mononitrate  60 mg Oral Daily  . mouth rinse  15 mL Mouth Rinse BID  . montelukast  10 mg Oral QHS  . ofloxacin  1 drop Both Eyes Daily  . pantoprazole  40 mg Oral Daily  . rosuvastatin  20 mg Oral q1800  . tamsulosin  0.4 mg Oral QHS  . vitamin B-12  2,000  mcg Oral Daily      Madelon Lips, MD 02/05/2021, 11:38 AM
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
## 2021-02-05 NOTE — Progress Notes (Signed)
Physical Therapy Treatment Note  SATURATION QUALIFICATIONS: (This note is used to comply with regulatory documentation for home oxygen)  Patient Saturations on Room Air at Rest = 90%  Patient Saturations on Room Air while Ambulating = 85%  Patient Saturations on 1 Liters of oxygen while Ambulating = 90%  Please briefly explain why patient needs home oxygen: Patient requires supplemental oxygen to maintain oxygen saturations at acceptable, safe levels with physical activity.  (full PT Evaluation note to follow)  Roney Marion, [place] Pager [phone] Office [phone]

## 2021-02-06 ENCOUNTER — Telehealth: Payer: Self-pay | Admitting: Podiatry

## 2021-02-06 LAB — CBC
HCT: 23.1 % — ABNORMAL LOW (ref 39.0–52.0)
Hemoglobin: 7.4 g/dL — ABNORMAL LOW (ref 13.0–17.0)
MCH: 28.7 pg (ref 26.0–34.0)
MCHC: 32 g/dL (ref 30.0–36.0)
MCV: 89.5 fL (ref 80.0–100.0)
Platelets: 181 10*3/uL (ref 150–400)
RBC: 2.58 MIL/uL — ABNORMAL LOW (ref 4.22–5.81)
RDW: 15.7 % — ABNORMAL HIGH (ref 11.5–15.5)
WBC: 4.9 10*3/uL (ref 4.0–10.5)
nRBC: 0 % (ref 0.0–0.2)

## 2021-02-06 LAB — BASIC METABOLIC PANEL
Anion gap: 7 (ref 5–15)
BUN: 81 mg/dL — ABNORMAL HIGH (ref 8–23)
CO2: 23 mmol/L (ref 22–32)
Calcium: 7.7 mg/dL — ABNORMAL LOW (ref 8.9–10.3)
Chloride: 106 mmol/L (ref 98–111)
Creatinine, Ser: 6.4 mg/dL — ABNORMAL HIGH (ref 0.61–1.24)
GFR, Estimated: 9 mL/min — ABNORMAL LOW (ref >60)
Glucose, Bld: 139 mg/dL — ABNORMAL HIGH (ref 70–99)
Potassium: 5.3 mmol/L — ABNORMAL HIGH (ref 3.5–5.1)
Sodium: 136 mmol/L (ref 135–145)

## 2021-02-06 LAB — GLUCOSE, CAPILLARY
Glucose-Capillary: 223 mg/dL — ABNORMAL HIGH (ref 70–99)
Glucose-Capillary: 121 mg/dL — ABNORMAL HIGH (ref 70–99)
Glucose-Capillary: 87 mg/dL (ref 70–99)
Glucose-Capillary: 100 mg/dL — ABNORMAL HIGH (ref 70–99)

## 2021-02-06 LAB — OCCULT BLOOD X 1 CARD TO LAB, STOOL: Fecal Occult Bld: NEGATIVE

## 2021-02-06 LAB — CYTOLOGY - NON PAP

## 2021-02-06 MED ORDER — POLYETHYLENE GLYCOL 3350 17 G PO PACK
34.0000 g | PACK | Freq: Every day | ORAL | Status: DC
  Administered 2021-02-06 – 2021-02-08 (×2): 34 g via ORAL
  Filled 2021-02-06 (×4): qty 2

## 2021-02-06 MED ORDER — SODIUM ZIRCONIUM CYCLOSILICATE 10 G PO PACK
10.0000 g | PACK | Freq: Every day | ORAL | Status: DC
  Administered 2021-02-06 – 2021-02-07 (×2): 10 g via ORAL
  Filled 2021-02-06 (×2): qty 1

## 2021-02-06 MED ORDER — FUROSEMIDE 80 MG PO TABS
80.0000 mg | ORAL_TABLET | Freq: Two times a day (BID) | ORAL | Status: DC
  Administered 2021-02-06: 80 mg via ORAL
  Filled 2021-02-06: qty 1

## 2021-02-06 MED ORDER — BISACODYL 5 MG PO TBEC
10.0000 mg | DELAYED_RELEASE_TABLET | Freq: Once | ORAL | Status: AC
  Administered 2021-02-06: 10 mg via ORAL
  Filled 2021-02-06: qty 2

## 2021-02-06 MED ORDER — LINACLOTIDE 145 MCG PO CAPS
145.0000 ug | ORAL_CAPSULE | Freq: Every day | ORAL | Status: DC
  Administered 2021-02-06 – 2021-02-09 (×4): 145 ug via ORAL
  Filled 2021-02-06 (×4): qty 1

## 2021-02-06 MED ORDER — BISACODYL 10 MG RE SUPP: 10.0000 mg | Freq: Every day | RECTAL | Status: DC | PRN

## 2021-02-06 NOTE — Progress Notes (Signed)
PT Cancellation Note  Patient Details Name: Elizardo Chilson [facility] MRN: [id number] DOB: 1951-05-06   Cancelled Treatment:    Reason Eval/Treat Not Completed: Other (comment). Pt on bsc.    Shary Decamp [facility] 02/06/2021, 5:19 PM [place] Pager [phone] Office [phone]

## 2021-02-06 NOTE — Progress Notes (Addendum)
[facility] Progress Note     Assessment/ Plan:   1. AKI on CKD IV: pt's baseline Cr is 4.0. Now up to 6 in the setting of pneumonia and a little bit of volume overload. Doesn't appear to have been hypotensive. Likely septic method of injury.  Plan: - supportive care - daily weights, I/O - started Lasix back on 4/9- 40 mg PO daily, home dose is 80 TID  -> incr to 80mg  BID - no indication for dialysis yet- discussed that pt may need it if things don't turn around--> has AVF (lt BBT)  ready for use.  - follow closely -> he wants to avoid starting RRT but his sats dropped (only after sitting after walking) + nausea. We will see what his labs show today (just drawn now) and I notified him that we will discuss pending results.  2. R pleural effusion - thoracentesis 4/10 --> had 2.2L out! - fluid studies appear transudative, gm stain few WBCs, culture negative  3. DCHF: - small amount of volume on board - Lasix as above -> not great output and 1-2+ edema in LE  4. HTN: - home meds - reasonably well controlled  5. Anemia: iron deficient but on abx and would avoid IV iron. Transfuse as needed.   6. Dispo: admitted  Subjective:   Seen in room.  Cr up to 5.9 yest, Lasix 40 resumed 4/9  Complains of constipation, decr sats sitting after walking, mild nausea.   Objective:   BP (!) 136/51   Pulse 61   Temp 98.6 F (37 C) (Oral)   Resp 18   Ht 6\' 3"  (1.905 m)   Wt 99.3 kg   SpO2 96%   BMI 27.36 kg/m   Intake/Output Summary (Last 24 hours) at 02/06/2021 0812 Last data filed at 02/06/2021 0547 Gross per 24 hour  Intake 219.15 ml  Output 625 ml  Net -405.85 ml   Weight change: 3.999 kg  Physical Exam: NAD, sitting in bed, NAD HEENT EOMI PERRL  NECK no JVD PULM better air movement today CV RRR ABD soft EXT 1+ LE edema NEURO AAO x 3 nonfocal ACCESS: L BBT + T/B  Imaging: No results found.  Labs: BMET Recent Labs  Lab 02/01/21 1300  02/02/21 0720 02/03/21 0147 02/04/21 0026 02/05/21 0653  NA 137 137 135 135 135  K 5.1 5.1 4.7 4.6 5.4*  CL 108 107 104 105 105  CO2 22 24 24  21* 21*  GLUCOSE 125* 93 152* 146* 122*  BUN 63* 65* 68* 70* 79*  CREATININE 5.49* 5.80* 6.05* 5.70* 5.91*  CALCIUM 7.7* 7.2* 7.4* 7.6* 7.8*   CBC Recent Labs  Lab 02/01/21 1300 02/02/21 0720 02/03/21 0147 02/04/21 0026 02/05/21 0653  WBC 8.3 4.8 3.8* 4.4 4.7  NEUTROABS 7.4  --   --   --   --   HGB 9.2* 7.5* 7.2* 7.2* 7.4*  HCT 28.9* 23.5* 22.4* 22.0* 23.4*  MCV 90.6 90.4 90.3 90.2 91.8  PLT 159 132* 143* 161 179    Medications:    . apixaban  5 mg Oral BID  . azithromycin  500 mg Oral Daily  . bisacodyl  10 mg Oral Once  . calcitRIOL  0.25 mcg Oral Q M,W,F  . carvedilol  25 mg Oral BID  . cholecalciferol  2,000 Units Oral Daily  . darbepoetin (ARANESP) injection - NON-DIALYSIS  100 mcg Subcutaneous Q Sun-1800  . feeding supplement  237 mL Oral BID BM  .  furosemide  40 mg Oral Daily  . gabapentin  300 mg Oral BID  . guaiFENesin  1,200 mg Oral BID  . hydrALAZINE  100 mg Oral Q8H  . insulin aspart  0-9 Units Subcutaneous TID WC  . isosorbide mononitrate  60 mg Oral Daily  . linaclotide  145 mcg Oral QAC breakfast  . mouth rinse  15 mL Mouth Rinse BID  . montelukast  10 mg Oral QHS  . ofloxacin  1 drop Both Eyes Daily  . pantoprazole  40 mg Oral Daily  . polyethylene glycol  34 g Oral Daily  . rosuvastatin  20 mg Oral q1800  . tamsulosin  0.4 mg Oral QHS  . vitamin B-12  2,000 mcg Oral Daily      Otelia Santee, MD 02/06/2021, 8:12 AM
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
## 2021-02-06 NOTE — Telephone Encounter (Signed)
Diabetic patient, cancelled appointment currently at [facility], will call back to reschedule one discharged

## 2021-02-07 LAB — EXPECTORATED SPUTUM ASSESSMENT W GRAM STAIN, RFLX TO RESP C

## 2021-02-07 LAB — GLUCOSE, CAPILLARY
Glucose-Capillary: 90 mg/dL (ref 70–99)
Glucose-Capillary: 137 mg/dL — ABNORMAL HIGH (ref 70–99)
Glucose-Capillary: 106 mg/dL — ABNORMAL HIGH (ref 70–99)
Glucose-Capillary: 159 mg/dL — ABNORMAL HIGH (ref 70–99)

## 2021-02-07 LAB — CBC
HCT: 22.4 % — ABNORMAL LOW (ref 39.0–52.0)
Hemoglobin: 7.4 g/dL — ABNORMAL LOW (ref 13.0–17.0)
MCH: 30.3 pg (ref 26.0–34.0)
MCHC: 33 g/dL (ref 30.0–36.0)
MCV: 91.8 fL (ref 80.0–100.0)
Platelets: 196 10*3/uL (ref 150–400)
RBC: 2.44 MIL/uL — ABNORMAL LOW (ref 4.22–5.81)
RDW: 15.6 % — ABNORMAL HIGH (ref 11.5–15.5)
WBC: 4.8 10*3/uL (ref 4.0–10.5)
nRBC: 0 % (ref 0.0–0.2)

## 2021-02-07 LAB — BASIC METABOLIC PANEL
Anion gap: 9 (ref 5–15)
BUN: 81 mg/dL — ABNORMAL HIGH (ref 8–23)
CO2: 20 mmol/L — ABNORMAL LOW (ref 22–32)
Calcium: 7.7 mg/dL — ABNORMAL LOW (ref 8.9–10.3)
Chloride: 105 mmol/L (ref 98–111)
Creatinine, Ser: 6.59 mg/dL — ABNORMAL HIGH (ref 0.61–1.24)
GFR, Estimated: 8 mL/min — ABNORMAL LOW (ref >60)
Glucose, Bld: 95 mg/dL (ref 70–99)
Potassium: 5 mmol/L (ref 3.5–5.1)
Sodium: 134 mmol/L — ABNORMAL LOW (ref 135–145)

## 2021-02-07 LAB — HEPATITIS B SURFACE ANTIGEN: Hepatitis B Surface Ag: NONREACTIVE

## 2021-02-07 LAB — HEPATITIS B SURFACE ANTIBODY,QUALITATIVE: Hep B S Ab: REACTIVE — AB

## 2021-02-07 MED ORDER — FUROSEMIDE 80 MG PO TABS
80.0000 mg | ORAL_TABLET | Freq: Three times a day (TID) | ORAL | Status: DC
  Administered 2021-02-07 – 2021-02-09 (×8): 80 mg via ORAL
  Filled 2021-02-07 (×8): qty 1

## 2021-02-07 MED ORDER — CHLORHEXIDINE GLUCONATE CLOTH 2 % EX PADS
6.0000 | MEDICATED_PAD | Freq: Every day | CUTANEOUS | Status: DC
  Administered 2021-02-07 – 2021-02-09 (×3): 6 via TOPICAL

## 2021-02-07 MED ORDER — ROSUVASTATIN CALCIUM 5 MG PO TABS
10.0000 mg | ORAL_TABLET | Freq: Every day | ORAL | Status: DC
  Administered 2021-02-07 – 2021-02-09 (×3): 10 mg via ORAL
  Filled 2021-02-07 (×3): qty 2
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
## 2021-02-07 NOTE — Progress Notes (Addendum)
[facility] Progress Note     Assessment/ Plan:   1. AKI on CKD IV: pt's baseline Cr is 4.0. Now up to 6 in the setting of pneumonia and a little bit of volume overload. Doesn't appear to have been hypotensive. Likely septic method of injury.  Plan: - supportive care - daily weights, I/O -started Lasix back on 4/9- 40 mg PO daily, home dose is 80 TID  -> incr to 80mg  BID -> would increase back to home dose of 80mg  TID  - progressive decline in renal function and requiring O2 and finally he is willing to start dialysis today tnanks to d/w Dr. Waldron [facility].  - Fortunately he has a nice AVF (lt BBT)  ready for use; plan on HD today and will start the CLIP process. Renal navigator aware and working on finding him a center.  2. R pleural effusion - thoracentesis 4/10 --> had 2.2L out! - fluid studies appear transudative, gm stainfew WBCs, culture negative  3. DCHF: - small amount of volume on board -Lasix as above -> not great output and 1-2+ edema in LE  4. HTN: - home meds - reasonably well controlled  5. Anemia: iron deficient but on abx and would avoid IV iron. Transfuse as needed.   6. Dispo: admitted  Subjective:   Seen in room.Cr continues to increase. Decr sats  after walking, mild nausea intermittently.   Objective:   BP (!) 154/66   Pulse 65   Temp 98 F (36.7 C) (Axillary)   Resp 19   Ht 6\' 3"  (1.905 m)   Wt 99.3 kg   SpO2 95%   BMI 27.36 kg/m   Intake/Output Summary (Last 24 hours) at 02/07/2021 0704 Last data filed at 02/07/2021 0541 Gross per 24 hour  Intake 477 ml  Output 802 ml  Net -325 ml   Weight change: 0 kg  Physical Exam: NAD, sitting in bed, NAD HEENT EOMI   NECK no JVD PULM better air movement today CV RRR ABD soft EXT 1+ LE edema NEURO AAO x 3 nonfocal ACCESS: L BBT + T/B  Imaging: No results found.  Labs: BMET Recent Labs  Lab 02/01/21 1300 02/02/21 0720 02/03/21 0147 02/04/21 0026  02/05/21 0653 02/06/21 0820 02/07/21 0151  NA 137 137 135 135 135 136 134*  K 5.1 5.1 4.7 4.6 5.4* 5.3* 5.0  CL 108 107 104 105 105 106 105  CO2 22 24 24  21* 21* 23 20*  GLUCOSE 125* 93 152* 146* 122* 139* 95  BUN 63* 65* 68* 70* 79* 81* 81*  CREATININE 5.49* 5.80* 6.05* 5.70* 5.91* 6.40* 6.59*  CALCIUM 7.7* 7.2* 7.4* 7.6* 7.8* 7.7* 7.7*   CBC Recent Labs  Lab 02/01/21 1300 02/02/21 0720 02/04/21 0026 02/05/21 0653 02/06/21 0820 02/07/21 0151  WBC 8.3   < > 4.4 4.7 4.9 4.8  NEUTROABS 7.4  --   --   --   --   --   HGB 9.2*   < > 7.2* 7.4* 7.4* 7.4*  HCT 28.9*   < > 22.0* 23.4* 23.1* 22.4*  MCV 90.6   < > 90.2 91.8 89.5 91.8  PLT 159   < > 161 179 181 196   < > = values in this interval not displayed.    Medications:    . apixaban  5 mg Oral BID  . calcitRIOL  0.25 mcg Oral Q M,W,F  . carvedilol  25 mg Oral BID  . cholecalciferol  2,000 Units  Oral Daily  . darbepoetin (ARANESP) injection - NON-DIALYSIS  100 mcg Subcutaneous Q Sun-1800  . feeding supplement  237 mL Oral BID BM  . furosemide  80 mg Oral BID  . gabapentin  300 mg Oral BID  . guaiFENesin  1,200 mg Oral BID  . hydrALAZINE  100 mg Oral Q8H  . insulin aspart  0-9 Units Subcutaneous TID WC  . isosorbide mononitrate  60 mg Oral Daily  . linaclotide  145 mcg Oral QAC breakfast  . mouth rinse  15 mL Mouth Rinse BID  . montelukast  10 mg Oral QHS  . ofloxacin  1 drop Both Eyes Daily  . pantoprazole  40 mg Oral Daily  . polyethylene glycol  34 g Oral Daily  . rosuvastatin  20 mg Oral q1800  . sodium zirconium cyclosilicate  10 g Oral Daily  . tamsulosin  0.4 mg Oral QHS  . vitamin B-12  2,000 mcg Oral Daily      Otelia Santee, MD 02/07/2021, 7:04 AM

## 2021-02-07 NOTE — Progress Notes (Addendum)
Physical Therapy Treatment Patient Details Name: William Webb [facility] MRN: [id number] DOB: 09/26/1951 Today's Date: 02/07/2021    History of Present Illness William Webb is a 70 y.o. male presented with acute onset of cough and shortness of breath; with work-up significant for pneumonia, recurrent right pleural effusion, and worsening renal function, Creatinine 5.4 compared to 4.1-4.3 baseline; PMH of  significant of chronic diastolic CHF, CKD stage IV, HTN, IDDM, PAF on Eliquis, DM neuropathy    PT Comments    Pt in bed reporting that he is agreeable to HD, but lamenting the fact that his health had become so poor. Pt limited in safe mobility today by increased O2 demand, and incontinence of bowels, in presence of generalized weakness and decreased balance. Pt mod I for bed mobility, min guard for transfers and min A for ambulation in hallway. D/c plans remain appropriate. PT will continue to follow acutely.    Follow Up Recommendations  Home health PT     Equipment Recommendations  None recommended by PT (well-equipped)       Precautions / Restrictions Precautions Precautions: Fall Precaution Comments: Mild fall risk; tends to reach out for UE support Restrictions Weight Bearing Restrictions: No    Mobility  Bed Mobility Overal bed mobility: Modified Independent             General bed mobility comments: Slow moving, but no assist needed    Transfers Overall transfer level: Needs assistance Equipment used: None Transfers: Sit to/from Stand Sit to Stand: Min guard         General transfer comment: Noteable use of UEs to push up and steady himself  Ambulation/Gait Ambulation/Gait assistance: Min guard;Min assist Gait Distance (Feet): 80 Feet Assistive device: None (but tending to reach out for hallwya rail  or desk) Gait Pattern/deviations: Step-through pattern Gait velocity: slowed Gait velocity interpretation: 1.31 - 2.62 ft/sec, indicative of  limited community ambulator General Gait Details: pt with use of hand rail for steadying, after ambulation of approximately 40 feet pt became incontinent of his bowels, provided min A for steadying for increased velocity return to room       Balance Overall balance assessment: Mild deficits observed, not formally tested                                          Cognition Arousal/Alertness: Awake/alert Behavior During Therapy: WFL for tasks assessed/performed Overall Cognitive Status: Within Functional Limits for tasks assessed                                           General Comments General comments (skin integrity, edema, etc.): Pt on 1L of O2 via [place] with SaO2 94%O2, desturation with RA and ambulation, walking O2 sat note provided.      Pertinent Vitals/Pain Pain Assessment: No/denies pain           PT Goals (current goals can now be found in the care plan section) Acute Rehab PT Goals Patient Stated Goal: Wants to get fully well before getting home PT Goal Formulation: With patient Time For Goal Achievement: 02/19/21 Potential to Achieve Goals: Good Progress towards PT goals: Progressing toward goals    Frequency    Min 3X/week      PT Plan Current plan remains appropriate  AM-PAC PT "6 Clicks" Mobility   Outcome Measure  Help needed turning from your back to your side while in a flat bed without using bedrails?: None Help needed moving from lying on your back to sitting on the side of a flat bed without using bedrails?: None Help needed moving to and from a bed to a chair (including a wheelchair)?: None Help needed standing up from a chair using your arms (e.g., wheelchair or bedside chair)?: A Little Help needed to walk in hospital room?: A Little Help needed climbing 3-5 steps with a railing? : A Little 6 Click Score: 21    End of Session Equipment Utilized During Treatment: Gait belt;Oxygen Activity Tolerance:  Patient tolerated treatment well Patient left: in bed;with call bell/phone within reach;with bed alarm set Nurse Communication: Mobility status PT Visit Diagnosis: Other abnormalities of gait and mobility (R26.89)     Time: 6184-8592 PT Time Calculation (min) (ACUTE ONLY): 30 min  Charges:  $Gait Training: 8-22 mins $Self Care/Home Management: 8-22                     Dhara Schepp B. Migdalia Dk PT, DPT Acute Rehabilitation Services Pager [phone] Office [phone]    [place] 02/07/2021, 1:04 PM

## 2021-02-07 NOTE — Progress Notes (Signed)
SATURATION QUALIFICATIONS: (This note is used to comply with regulatory documentation for home oxygen)  Patient Saturations on Room Air at Rest = 87%  Patient Saturations on Room Air while Ambulating = 82%  Patient Saturations on 3 Liters of oxygen while Ambulating = 91%  Please briefly explain why patient needs home oxygen:  Pt requires 3L O2 via [place] to maintain SaO2 above 90%O2 for safe ambulation.

## 2021-02-07 NOTE — Progress Notes (Signed)
Renal Navigator submitted outpatient HD referral to [facility] to request seat at [facility] clinic per request of Dr. Lurlean Horns. Navigator notes that patient followed with [name] at [facility] and already has permanent access placed. Per notes, his first HD is scheduled for today and AVF will be tried.  Navigator attempted to meet with patient to discuss referral and ensure transportation to/from outpatient HD, but patient was asleep, sitting up in recliner and did not awaken to voice or knocking at door. Navigator will attempt again and follow up with seat schedule.  Alphonzo Cruise, [place] Renal Navigator [phone]
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
## 2021-02-08 ENCOUNTER — Encounter (INDEPENDENT_AMBULATORY_CARE_PROVIDER_SITE_OTHER): Payer: Medicare HMO | Admitting: Ophthalmology

## 2021-02-08 DIAGNOSIS — E113512 Type 2 diabetes mellitus with proliferative diabetic retinopathy with macular edema, left eye: Secondary | ICD-10-CM

## 2021-02-08 DIAGNOSIS — E113411 Type 2 diabetes mellitus with severe nonproliferative diabetic retinopathy with macular edema, right eye: Secondary | ICD-10-CM

## 2021-02-08 DIAGNOSIS — Z961 Presence of intraocular lens: Secondary | ICD-10-CM

## 2021-02-08 DIAGNOSIS — H18513 Endothelial corneal dystrophy, bilateral: Secondary | ICD-10-CM

## 2021-02-08 DIAGNOSIS — I10 Essential (primary) hypertension: Secondary | ICD-10-CM

## 2021-02-08 DIAGNOSIS — H35033 Hypertensive retinopathy, bilateral: Secondary | ICD-10-CM

## 2021-02-08 DIAGNOSIS — H43822 Vitreomacular adhesion, left eye: Secondary | ICD-10-CM

## 2021-02-08 DIAGNOSIS — H3581 Retinal edema: Secondary | ICD-10-CM

## 2021-02-08 DIAGNOSIS — N186 End stage renal disease: Secondary | ICD-10-CM

## 2021-02-08 DIAGNOSIS — H40003 Preglaucoma, unspecified, bilateral: Secondary | ICD-10-CM

## 2021-02-08 DIAGNOSIS — H4312 Vitreous hemorrhage, left eye: Secondary | ICD-10-CM

## 2021-02-08 LAB — GLUCOSE, CAPILLARY
Glucose-Capillary: 117 mg/dL — ABNORMAL HIGH (ref 70–99)
Glucose-Capillary: 149 mg/dL — ABNORMAL HIGH (ref 70–99)
Glucose-Capillary: 160 mg/dL — ABNORMAL HIGH (ref 70–99)
Glucose-Capillary: 138 mg/dL — ABNORMAL HIGH (ref 70–99)

## 2021-02-08 LAB — BASIC METABOLIC PANEL
Anion gap: 7 (ref 5–15)
BUN: 52 mg/dL — ABNORMAL HIGH (ref 8–23)
CO2: 25 mmol/L (ref 22–32)
Calcium: 7.6 mg/dL — ABNORMAL LOW (ref 8.9–10.3)
Chloride: 104 mmol/L (ref 98–111)
Creatinine, Ser: 5.07 mg/dL — ABNORMAL HIGH (ref 0.61–1.24)
GFR, Estimated: 11 mL/min — ABNORMAL LOW (ref >60)
Glucose, Bld: 149 mg/dL — ABNORMAL HIGH (ref 70–99)
Potassium: 4.1 mmol/L (ref 3.5–5.1)
Sodium: 136 mmol/L (ref 135–145)

## 2021-02-08 LAB — CULTURE, BODY FLUID W GRAM STAIN -BOTTLE: Culture: NO GROWTH

## 2021-02-08 LAB — CBC
HCT: 21.9 % — ABNORMAL LOW (ref 39.0–52.0)
Hemoglobin: 7 g/dL — ABNORMAL LOW (ref 13.0–17.0)
MCH: 28.9 pg (ref 26.0–34.0)
MCHC: 32 g/dL (ref 30.0–36.0)
MCV: 90.5 fL (ref 80.0–100.0)
Platelets: 191 10*3/uL (ref 150–400)
RBC: 2.42 MIL/uL — ABNORMAL LOW (ref 4.22–5.81)
RDW: 15.3 % (ref 11.5–15.5)
WBC: 4.6 10*3/uL (ref 4.0–10.5)
nRBC: 0 % (ref 0.0–0.2)

## 2021-02-08 LAB — HEPATITIS B SURFACE ANTIBODY, QUANTITATIVE: Hep B S AB Quant (Post): >1000 m[IU]/mL (ref >9.9)

## 2021-02-08 MED ORDER — DARBEPOETIN ALFA 200 MCG/0.4ML IJ SOSY: 200.0000 ug | PREFILLED_SYRINGE | INTRAMUSCULAR | Status: DC

## 2021-02-08 MED ORDER — SODIUM CHLORIDE 0.9 % IV SOLN
125.0000 mg | INTRAVENOUS | Status: DC
  Filled 2021-02-08 (×2): qty 10

## 2021-02-08 MED ORDER — DARBEPOETIN ALFA 100 MCG/0.5ML IJ SOSY
100.0000 ug | PREFILLED_SYRINGE | Freq: Once | INTRAMUSCULAR | Status: DC
  Filled 2021-02-08: qty 0.5

## 2021-02-08 MED ORDER — DARBEPOETIN ALFA 100 MCG/0.5ML IJ SOSY: 100.0000 ug | PREFILLED_SYRINGE | Freq: Once | INTRAMUSCULAR | Status: AC

## 2021-02-08 NOTE — Progress Notes (Signed)
Renal Navigator met with patient at bedside to introduce self and discuss outpatient HD referral. Patient was pleasant and welcoming. He is understanding of need to continue HD post hospitalization and asked what is needed of him. We discussed what to expect and Navigator obtained copy of insurance cards/faxed to [facility]. Patient states his wife is still working (travels to [place] working in [place] and cannot leave work due to a blackout with her company. He tells her he is fine, but she is understandably very upset that she cannot be here with him right now). 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 He talked about having a forced retirement two years ago because of getting sick with pneumonia and then finding out about many other chronic illnesses. He states he has a huge and close-knit extended family. He is one of 13 children, but states all but 2 siblings have passed away at this time. He has two daughters, who he sounds to be very close with. One lives locally, and the other is planning to travel here soon to be with him, though he tells her not to-"I'm fine." Navigator encouraged him to allow his family to support him, especially now while he is adjusting to HD treatments.  Patient knows that he will not be strong enough to drive immediately following discharge, but asks Navigator if he will ever be able to drive again. Navigator told patient that lots of HD patients are able to drive again, but agrees that he should not drive right away. He understands that he needs to see how he feels while adjusting to HD out of the hospital. He sounds like he understands how important this is. He thinks he has supplemental transportation benefits through [facility] encouraged him to call to check. Navigator educated him on [employer] for transportation, which he is interested in. Navigator will assist him in applying.   Alphonzo Cruise, [place] Renal  Navigator [phone]

## 2021-02-08 NOTE — Progress Notes (Signed)
Patient has been accepted at [facility] outpatient HD clinic on a TTS schedule with a seat time of 12:00pm. He needs to arrive at 11:40am. We will tentatively plan for Saturday, 02/11/21 for his first day at the clinic, therefore, patient will need to go to the clinic Friday, 02/10/21 to complete paperwork in order to start on a Saturday.  Navigator will follow up with patient tomorrow to discuss.   Alphonzo Cruise, [place] Renal Navigator [phone]

## 2021-02-08 NOTE — Progress Notes (Signed)
Renal Navigator attempted to follow up with patient to discuss outpatient HD referral. He was on the [facility], so Navigator offered to return later. Navigator came back about 30 minutes later and he stated that [facility] needed to be emptied. He did not want to talk in his room until this had been done.   Alphonzo Cruise, [place] Renal Navigator [phone]

## 2021-02-08 NOTE — Progress Notes (Addendum)
[facility] Progress Note     Assessment/ Plan:   1. AKI on CKD IV: pt's baseline Cr is 4.0. Now up to 6 in the setting of pneumonia and a little bit of volume overload. Doesn't appear to have been hypotensive. Likely septic method of injury.  Plan: - supportive care - daily weights, I/O - On Lasix home dose  - progressive decline in renal function and requiring O2 and finally was  willing to start dialysis on 4/12 -> tolerated tx and able to net UF 1.5L.  - will give him a break today and plan on hd thur + fri. - Already started the CLIP process. Renal navigator aware and working on finding him a center.  2. R pleural effusion - thoracentesis 4/10 --> had 2.2L out! - fluid studies appear transudative, gm stainfew WBCs, culture negative  3. DCHF: - small amount of volume on board - UF + Lasix; once UOP decr can stop the Lasix  4. HTN: - home meds - reasonably well controlled  5. Anemia: iron deficient and off  abx  - start  IV iron protocol on hd; on Aranesp 140mcg weekly already. Last given sun; would like to give 182mcg tomorrow and then change to 229mcg qthur).  - Transfuse as needed.   6. Dispo: admitted  Subjective:   Seen in room. Tolerated HD yest.  Decr sats  after walking, no further  nausea    Objective:   BP 115/73   Pulse 65   Temp 98.1 F (36.7 C) (Axillary)   Resp 17   Ht 6\' 3"  (1.905 m)   Wt 93.1 kg   SpO2 94%   BMI 25.65 kg/m   Intake/Output Summary (Last 24 hours) at 02/08/2021 0716 Last data filed at 02/08/2021 8502 Gross per 24 hour  Intake --  Output 2200 ml  Net -2200 ml   Weight change: -5.6 kg  Physical Exam: NAD, sitting in bed, NAD HEENT EOMI   NECK no JVD PULM better air movement today CV RRR ABD soft EXT 1+ LE edema NEURO AAO x 3 nonfocal ACCESS: L BBT + T/B  Imaging: No results found.  Labs: BMET Recent Labs  Lab 02/02/21 0720 02/03/21 0147 02/04/21 0026 02/05/21 0653  02/06/21 0820 02/07/21 0151 02/08/21 0217  NA 137 135 135 135 136 134* 136  K 5.1 4.7 4.6 5.4* 5.3* 5.0 4.1  CL 107 104 105 105 106 105 104  CO2 24 24 21* 21* 23 20* 25  GLUCOSE 93 152* 146* 122* 139* 95 149*  BUN 65* 68* 70* 79* 81* 81* 52*  CREATININE 5.80* 6.05* 5.70* 5.91* 6.40* 6.59* 5.07*  CALCIUM 7.2* 7.4* 7.6* 7.8* 7.7* 7.7* 7.6*   CBC Recent Labs  Lab 02/01/21 1300 02/02/21 0720 02/05/21 0653 02/06/21 0820 02/07/21 0151 02/08/21 0217  WBC 8.3   < > 4.7 4.9 4.8 4.6  NEUTROABS 7.4  --   --   --   --   --   HGB 9.2*   < > 7.4* 7.4* 7.4* 7.0*  HCT 28.9*   < > 23.4* 23.1* 22.4* 21.9*  MCV 90.6   < > 91.8 89.5 91.8 90.5  PLT 159   < > 179 181 196 191   < > = values in this interval not displayed.    Medications:    . apixaban  5 mg Oral BID  . calcitRIOL  0.25 mcg Oral Q M,W,F  . carvedilol  25 mg Oral BID  .  Chlorhexidine Gluconate Cloth  6 each Topical Q0600  . cholecalciferol  2,000 Units Oral Daily  . darbepoetin (ARANESP) injection - NON-DIALYSIS  100 mcg Subcutaneous Q Sun-1800  . feeding supplement  237 mL Oral BID BM  . furosemide  80 mg Oral TID  . gabapentin  300 mg Oral BID  . guaiFENesin  1,200 mg Oral BID  . hydrALAZINE  100 mg Oral Q8H  . insulin aspart  0-9 Units Subcutaneous TID WC  . isosorbide mononitrate  60 mg Oral Daily  . linaclotide  145 mcg Oral QAC breakfast  . mouth rinse  15 mL Mouth Rinse BID  . montelukast  10 mg Oral QHS  . ofloxacin  1 drop Both Eyes Daily  . pantoprazole  40 mg Oral Daily  . polyethylene glycol  34 g Oral Daily  . rosuvastatin  10 mg Oral q1800  . tamsulosin  0.4 mg Oral QHS  . vitamin B-12  2,000 mcg Oral Daily      Otelia Santee, MD 02/08/2021, 7:16 AM

## 2021-02-08 NOTE — Telephone Encounter (Deleted)
error 

## 2021-02-08 NOTE — Progress Notes (Signed)
PROGRESS NOTE        PATIENT DETAILS Name: William Webb [facility] Age: 70 y.o. Sex: male Date of Birth: August 20, 1951 Admit Date: 02/01/2021 Admitting Physician Lequita Halt, MD EQA:[id number], L.Marlou Sa, MD  Brief Narrative: Patient is a 70 y.o. male with history of chronic diastolic heart failure, CKD stage IV, HTN, IDDM-2, PAF on Eliquis, presenting with cough/shortness of breath-found to have PNA and thought progression of CKD stage IV to ESRD.  See below for further details.  Significant events: 4/6>> admit to [facility]-PNA-progression to ESRD.  Significant studies: 4/7>> CT chest: Nodular consolidation-left upper lobe, moderate right pleural effusion, small pericardial effusion, small mediastinal adenopathy 4/8>> renal ultrasound: Medical renal disease-no hydronephrosis  Antimicrobial therapy: Rocephin: 4/7>> 4/10 Zithromax: 4/7>> 4/11  Microbiology data: 4/8>> right pleural fluid: No growth 4/8>> sputum culture: Staph epidermidis  Pathology: 4/8>> pleural fluid: Reactive mesothelial cells.  Procedures : 4/8>> right-sided thoracocentesis:  Consults: Nephrology, IR  DVT Prophylaxis : apixaban (ELIQUIS) tablet 5 mg    Subjective: Denies any chest pain or shortness of breath.  Inquiring when he can be discharged  Assessment/Plan: Left lobar PNA: Improved-completed course of antimicrobial therapy.  Culture data as above.    Right-sided pleural effusion: Transudative-likely due to CKD.  Cytology negative.  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  Hyperkalemia: Resolved  Chronic normocytic anemia: Secondary to ESRD-brown stools-no evidence of blood loss.  IV iron/Aranesp per nephrology.  Will transfuse if hemoglobin < 7  Chronic diastolic heart failure: Volume management with hemodialysis-although remains on Lasix-reasonably well compensated.  PAF: Rate controlled with Coreg-on  Eliquis  HTN: Controlled-continue Eliquis, Lasix, Imdur  HLD: Continue statin  DM-2 (A1c 6.3 on 4/5): CBGs controlled with SSI   Recent Labs    02/07/21 2033 02/08/21 0807 02/08/21 1155  GLUCAP 159* 117* 149*   BPH: On Flomax   Diet: Diet Order            Diet renal/carb modified with fluid restriction Diet-HS Snack? Nothing; Fluid restriction: 1200 mL Fluid; Room service appropriate? Yes; Fluid consistency: Thin  Diet effective now                  Code Status: Full code   Family Communication: None at bedside   Disposition Plan: Status is: Inpatient  Remains inpatient appropriate because:Inpatient level of care appropriate due to severity of illness   Dispo: The patient is from: Home              Anticipated d/c is to: Home              Patient currently is not medically stable to d/c.   Difficult to place patient No   Barriers to Discharge: New ESRD-needs outpatient HD arranged.  Antimicrobial agents: Anti-infectives (From admission, onward)   Start     Dose/Rate Route Frequency Ordered Stop   02/02/21 1500  azithromycin (ZITHROMAX) tablet 500 mg        500 mg Oral Daily 02/01/21 1653 02/06/21 0842   02/02/21 1430  cefTRIAXone (ROCEPHIN) 2 g in sodium chloride 0.9 % 100 mL IVPB        2 g 200 mL/hr over 30 Minutes Intravenous Every 24 hours 02/01/21 1653 02/05/21 1430   02/01/21 1400  cefTRIAXone (ROCEPHIN) 1 g in sodium chloride 0.9 % 100 mL IVPB  1 g 200 mL/hr over 30 Minutes Intravenous  Once 02/01/21 1354 02/01/21 1511   02/01/21 1400  azithromycin (ZITHROMAX) 500 mg in sodium chloride 0.9 % 250 mL IVPB        500 mg 250 mL/hr over 60 Minutes Intravenous  Once 02/01/21 1354 02/01/21 1703       Time spent: 25- minutes-Greater than 50% of this time was spent in counseling, explanation of diagnosis, planning of further management, and coordination of care.  MEDICATIONS: Scheduled Meds: . apixaban  5 mg Oral BID  . calcitRIOL  0.25  mcg Oral Q M,W,F  . carvedilol  25 mg Oral BID  . Chlorhexidine Gluconate Cloth  6 each Topical Q0600  . cholecalciferol  2,000 Units Oral Daily  . [START ON 02/09/2021] darbepoetin (ARANESP) injection - NON-DIALYSIS  100 mcg Subcutaneous Once  . [START ON 02/12/2021] darbepoetin (ARANESP) injection - NON-DIALYSIS  200 mcg Subcutaneous Q Sun-1800  . feeding supplement  237 mL Oral BID BM  . furosemide  80 mg Oral TID  . gabapentin  300 mg Oral BID  . guaiFENesin  1,200 mg Oral BID  . hydrALAZINE  100 mg Oral Q8H  . insulin aspart  0-9 Units Subcutaneous TID WC  . isosorbide mononitrate  60 mg Oral Daily  . linaclotide  145 mcg Oral QAC breakfast  . mouth rinse  15 mL Mouth Rinse BID  . montelukast  10 mg Oral QHS  . ofloxacin  1 drop Both Eyes Daily  . pantoprazole  40 mg Oral Daily  . polyethylene glycol  34 g Oral Daily  . rosuvastatin  10 mg Oral q1800  . tamsulosin  0.4 mg Oral QHS  . vitamin B-12  2,000 mcg Oral Daily   Continuous Infusions: . [START ON 02/09/2021] ferric gluconate (FERRLECIT/NULECIT) IV     PRN Meds:.acetaminophen, bisacodyl, bisacodyl, ipratropium, ketorolac, methocarbamol, naphazoline-glycerin, ondansetron, prednisoLONE acetate   PHYSICAL EXAM: Vital signs: Vitals:   02/08/21 0422 02/08/21 0618 02/08/21 0756 02/08/21 1153  BP:  115/73  (!) 85/66  Pulse:    60  Resp:    18  Temp:    97.6 F (36.4 C)  TempSrc:    Oral  SpO2:   92% 92%  Weight: 93.1 kg     Height:       Filed Weights   02/07/21 1340 02/07/21 1644 02/08/21 0422  Weight: 93.7 kg 92.2 kg 93.1 kg   Body mass index is 25.65 kg/m.   Gen Exam:Alert awake-not in any distress HEENT:atraumatic, normocephalic Chest: B/L clear to auscultation anteriorly CVS:S1S2 regular Abdomen:soft non tender, non distended Extremities:trace edema Neurology: Non focal Skin: no rash  I have personally reviewed following labs and imaging studies  LABORATORY DATA: CBC: Recent Labs  Lab  02/04/21 0026 02/05/21 0653 02/06/21 0820 02/07/21 0151 02/08/21 0217  WBC 4.4 4.7 4.9 4.8 4.6  HGB 7.2* 7.4* 7.4* 7.4* 7.0*  HCT 22.0* 23.4* 23.1* 22.4* 21.9*  MCV 90.2 91.8 89.5 91.8 90.5  PLT 161 179 181 196 443    Basic Metabolic Panel: Recent Labs  Lab 02/04/21 0026 02/05/21 0653 02/06/21 0820 02/07/21 0151 02/08/21 0217  NA 135 135 136 134* 136  K 4.6 5.4* 5.3* 5.0 4.1  CL 105 105 106 105 104  CO2 21* 21* 23 20* 25  GLUCOSE 146* 122* 139* 95 149*  BUN 70* 79* 81* 81* 52*  CREATININE 5.70* 5.91* 6.40* 6.59* 5.07*  CALCIUM 7.6* 7.8* 7.7* 7.7* 7.6*    GFR: Estimated  Creatinine Clearance: 16 mL/min (A) (by C-G formula based on SCr of 5.07 mg/dL (H)).  Liver Function Tests: Recent Labs  Lab 02/01/21 1355 02/04/21 0835  AST 17  --   ALT 13  --   ALKPHOS 76  --   BILITOT 0.7  --   PROT 5.8* 5.3*  ALBUMIN 2.4*  --    No results for input(s): LIPASE, AMYLASE in the last 168 hours. No results for input(s): AMMONIA in the last 168 hours.  Coagulation Profile: No results for input(s): INR, PROTIME in the last 168 hours.  Cardiac Enzymes: No results for input(s): CKTOTAL, CKMB, CKMBINDEX, TROPONINI in the last 168 hours.  BNP (last 3 results) No results for input(s): PROBNP in the last 8760 hours.  Lipid Profile: No results for input(s): CHOL, HDL, LDLCALC, TRIG, CHOLHDL, LDLDIRECT in the last 72 hours.  Thyroid Function Tests: No results for input(s): TSH, T4TOTAL, FREET4, T3FREE, THYROIDAB in the last 72 hours.  Anemia Panel: No results for input(s): VITAMINB12, FOLATE, FERRITIN, TIBC, IRON, RETICCTPCT in the last 72 hours.  Urine analysis:    Component Value Date/Time   COLORURINE YELLOW 02/04/2021 0918   APPEARANCEUR CLEAR 02/04/2021 0918   LABSPEC 1.012 02/04/2021 0918   PHURINE 5.0 02/04/2021 0918   GLUCOSEU 50 (A) 02/04/2021 0918   HGBUR NEGATIVE 02/04/2021 0918   BILIRUBINUR NEGATIVE 02/04/2021 0918   KETONESUR NEGATIVE 02/04/2021 0918    PROTEINUR >=300 (A) 02/04/2021 0918   NITRITE NEGATIVE 02/04/2021 0918   LEUKOCYTESUR NEGATIVE 02/04/2021 0918    Sepsis Labs: Lactic Acid, Venous    Component Value Date/Time   LATICACIDVEN 0.7 12/10/2018 1134    MICROBIOLOGY: Recent Results (from the past 240 hour(s))  Resp Panel by RT-PCR (Flu A&B, Covid) Nasopharyngeal Swab     Status: None   Collection Time: 02/01/21  1:56 PM   Specimen: Nasopharyngeal Swab; Nasopharyngeal(NP) swabs in vial transport medium  Result Value Ref Range Status   SARS Coronavirus 2 by RT PCR NEGATIVE NEGATIVE Final    Comment: (NOTE) SARS-CoV-2 target nucleic acids are NOT DETECTED.  The SARS-CoV-2 RNA is generally detectable in upper respiratory specimens during the acute phase of infection. The lowest concentration of SARS-CoV-2 viral copies this assay can detect is 138 copies/mL. A negative result does not preclude SARS-Cov-2 infection and should not be used as the sole basis for treatment or other patient management decisions. A negative result may occur with  improper specimen collection/handling, submission of specimen other than nasopharyngeal swab, presence of viral mutation(s) within the areas targeted by this assay, and inadequate number of viral copies(<138 copies/mL). A negative result must be combined with clinical observations, patient history, and epidemiological information. The expected result is Negative.  Fact Sheet for Patients:  EntrepreneurPulse.com.au  Fact Sheet for Healthcare Providers:  IncredibleEmployment.be  This test is no t yet approved or cleared by the [place] FDA and  has been authorized for detection and/or diagnosis of SARS-CoV-2 by FDA under an Emergency Use Authorization (EUA). This EUA will remain  in effect (meaning this test can be used) for the duration of the COVID-19 declaration under Section 564(b)(1) of the Act, 21 U.S.C.section 360bbb-3(b)(1), unless  the authorization is terminated  or revoked sooner.       Influenza A by PCR NEGATIVE NEGATIVE Final   Influenza B by PCR NEGATIVE NEGATIVE Final    Comment: (NOTE) The Xpert Xpress SARS-CoV-2/FLU/RSV plus assay is intended as an aid in the diagnosis of influenza from Nasopharyngeal swab specimens  and should not be used as a sole basis for treatment. Nasal washings and aspirates are unacceptable for Xpert Xpress SARS-CoV-2/FLU/RSV testing.  Fact Sheet for Patients: EntrepreneurPulse.com.au  Fact Sheet for Healthcare Providers: IncredibleEmployment.be  This test is not yet approved or cleared by the [place] FDA and has been authorized for detection and/or diagnosis of SARS-CoV-2 by FDA under an Emergency Use Authorization (EUA). This EUA will remain in effect (meaning this test can be used) for the duration of the COVID-19 declaration under Section 564(b)(1) of the Act, 21 U.S.C. section 360bbb-3(b)(1), unless the authorization is terminated or revoked.  Performed at [facility], [place] [address], [place], [place] [address]   Culture, body fluid w Gram Stain-bottle     Status: None   Collection Time: 02/03/21 12:22 PM   Specimen: Fluid  Result Value Ref Range Status   Specimen Description FLUID RIGHT PLEURAL  Final   Special Requests BOTTLES DRAWN AEROBIC AND ANAEROBIC 10CC  Final   Culture   Final    NO GROWTH 5 DAYS Performed at [facility], [place] [address] [place], [place] [address]    Report Status 02/08/2021 FINAL  Final  Gram stain     Status: None   Collection Time: 02/03/21 12:22 PM   Specimen: Fluid  Result Value Ref Range Status   Specimen Description FLUID RIGHT PLEURAL  Final   Special Requests NONE  Final   Gram Stain   Final    FEW WBC PRESENT, PREDOMINANTLY MONONUCLEAR NO ORGANISMS SEEN Performed at [facility], [place] [address] [place], [place] [address]    Report Status 02/03/2021 FINAL   Final  Expectorated Sputum Assessment w Gram Stain, Rflx to Resp Cult     Status: None   Collection Time: 02/03/21  3:03 PM   Specimen: Expectorated Sputum  Result Value Ref Range Status   Specimen Description EXPECTORATED SPUTUM  Final   Special Requests NONE  Final   Sputum evaluation   Final    THIS SPECIMEN IS ACCEPTABLE FOR SPUTUM CULTURE Performed at [facility], [place] [address] [place], [place] [address]    Report Status 02/07/2021 FINAL  Final  Culture, Respiratory w Gram Stain     Status: None (Preliminary result)   Collection Time: 02/03/21  3:03 PM  Result Value Ref Range Status   Specimen Description EXPECTORATED SPUTUM  Final   Special Requests NONE Reflexed from [id number]  Final   Gram Stain   Final    FEW WBC PRESENT, PREDOMINANTLY PMN FEW GRAM POSITIVE COCCI RARE GRAM POSITIVE RODS Performed at [facility], [place] [address] [place], [place] [address]    Culture MODERATE STAPHYLOCOCCUS EPIDERMIDIS  Final   Report Status PENDING  Incomplete    RADIOLOGY STUDIES/RESULTS: No results found.   LOS: 7 days   Oren Binet, MD  [facility]    To contact the attending provider between 7A-7P or the covering provider during after hours 7P-7A, please log into the web site [email or web address] and access using universal [place] password for that web site. If you do not have the password, please call the hospital operator.  02/08/2021, 1:13 PM

## 2021-02-09 ENCOUNTER — Ambulatory Visit: Payer: Medicare HMO | Admitting: Podiatry

## 2021-02-09 LAB — RENAL FUNCTION PANEL
Albumin: 1.9 g/dL — ABNORMAL LOW (ref 3.5–5.0)
Anion gap: 5 (ref 5–15)
BUN: 55 mg/dL — ABNORMAL HIGH (ref 8–23)
CO2: 27 mmol/L (ref 22–32)
Calcium: 7.6 mg/dL — ABNORMAL LOW (ref 8.9–10.3)
Chloride: 102 mmol/L (ref 98–111)
Creatinine, Ser: 5.6 mg/dL — ABNORMAL HIGH (ref 0.61–1.24)
GFR, Estimated: 10 mL/min — ABNORMAL LOW (ref >60)
Glucose, Bld: 153 mg/dL — ABNORMAL HIGH (ref 70–99)
Phosphorus: 5 mg/dL — ABNORMAL HIGH (ref 2.5–4.6)
Potassium: 4.1 mmol/L (ref 3.5–5.1)
Sodium: 134 mmol/L — ABNORMAL LOW (ref 135–145)

## 2021-02-09 LAB — GLUCOSE, CAPILLARY
Glucose-Capillary: 154 mg/dL — ABNORMAL HIGH (ref 70–99)
Glucose-Capillary: 125 mg/dL — ABNORMAL HIGH (ref 70–99)
Glucose-Capillary: 153 mg/dL — ABNORMAL HIGH (ref 70–99)

## 2021-02-09 LAB — CBC
HCT: 21.7 % — ABNORMAL LOW (ref 39.0–52.0)
Hemoglobin: 7 g/dL — ABNORMAL LOW (ref 13.0–17.0)
MCH: 28.9 pg (ref 26.0–34.0)
MCHC: 32.3 g/dL (ref 30.0–36.0)
MCV: 89.7 fL (ref 80.0–100.0)
Platelets: 195 10*3/uL (ref 150–400)
RBC: 2.42 MIL/uL — ABNORMAL LOW (ref 4.22–5.81)
RDW: 15 % (ref 11.5–15.5)
WBC: 4.6 10*3/uL (ref 4.0–10.5)
nRBC: 0 % (ref 0.0–0.2)

## 2021-02-09 LAB — PREPARE RBC (CROSSMATCH)

## 2021-02-09 MED ORDER — DARBEPOETIN ALFA 100 MCG/0.5ML IJ SOSY
PREFILLED_SYRINGE | INTRAMUSCULAR | Status: AC
  Filled 2021-02-09: qty 0.5

## 2021-02-09 MED ORDER — DARBEPOETIN ALFA 100 MCG/0.5ML IJ SOSY
PREFILLED_SYRINGE | INTRAMUSCULAR | Status: AC
  Administered 2021-02-09: 100 ug via SUBCUTANEOUS
  Filled 2021-02-09: qty 0.5

## 2021-02-09 MED ORDER — SODIUM CHLORIDE 0.9% IV SOLUTION: Freq: Once | INTRAVENOUS | Status: DC

## 2021-02-09 NOTE — TOC Transition Note (Signed)
Transition of Care [facility]) - CM/SW Discharge Note   Patient Details  Name: William Webb MRN: [id number] Date of Birth: 05-24-1951  Transition of Care [facility]) CM/SW Contact:  Ninfa Meeker, RN Phone Number: 02/09/2021, 3:54 PM   Clinical Narrative:  Case manager spoke with patient concerning need for Home Health therapy and oxygen. Discussed choice for [place]. Patient has no preference, referral was called to Adela Lank, [facility] Liaison. CM explained to patient start of care will be next week, he understands, requests that therapy is opposite his dialysis days. Oxygen will be delivered to patient by [employer].  Final next level of care: [place] Barriers to Discharge: No Barriers Identified   Patient Goals and CMS Choice        Discharge Placement                       Discharge Plan and Services   Discharge Planning Services: CM Consult Post Acute Care Choice: Home Health,Durable Medical Equipment          DME Arranged: Oxygen DME Agency: [facility] Date DME Agency Contacted: 02/09/21 Time DME Agency Contacted: [phone]   [place]: PT,OT [place]: [place] Date HH Agency Contacted: 02/09/21 Time [place]: [phone] Representative spoke with at [place]: [place] ([place]) Interventions     Readmission Risk Interventions Readmission Risk Prevention Plan 12/11/2019  Transportation Screening Complete  PCP or Specialist Appt within 5-7 Days Complete  Home Care Screening Complete  Medication Review (RN CM) Complete  Some recent data might be hidden

## 2021-02-09 NOTE — Progress Notes (Signed)
PT Cancellation Note  Patient Details Name: William Webb [facility] MRN: [id number] DOB: 1951-07-27   Cancelled Treatment:    Reason Eval/Treat Not Completed: (P) Patient at procedure or test/unavailable Pt is off floor for HD. PT will follow back for treatment this afternoon as able.   Dejan Angert B. Migdalia Dk PT, DPT Acute Rehabilitation Services Pager [phone] Office [phone]    [place] 02/09/2021, 9:13 AM

## 2021-02-09 NOTE — Progress Notes (Signed)
[facility] Progress Note     Assessment/ Plan:   1. AKI on CKD IV: pt's baseline Cr is 4.0. Now up to 6 in the setting of pneumonia and a little bit of volume overload. Doesn't appear to have been hypotensive. Likely septic method of injury.  - On Lasix home dose  - progressive decline in renal function and requiring O2 and finally was  willing to start dialysis on 4/12 -> tolerated tx and able to net UF 1.5L.   - plan on  hd today and then can get outpt hd on sat but needs to go to [facility] unit on Fri ~1140am to complete paperwork  as a new pt   2. R pleural effusion - thoracentesis 4/10 --> had 2.2L out! - fluid studies appear transudative, gm stainfew WBCs, culture negative  3. DCHF: - small amount of volume on board - UF + Lasix; once UOP decr can stop the Lasix  4. HTN: - home meds - reasonably well controlled  5. Anemia: iron deficient and off  abx  - start  IV iron protocol on hd; on Aranesp 199mcg weekly already. Last given sun; would like to give 116mcg tomorrow and then change to 255mcg qthur).  - Transfuse 1 unit today with hd.   6. Dispo: admitted  Subjective:   Seen in room. Tolerated HD Tues. No further  nausea    Objective:   BP (!) 148/72 (BP Location: Left Leg)   Pulse 73   Temp 97.8 F (36.6 C) (Oral)   Resp 18   Ht 6\' 3"  (1.905 m)   Wt 92.2 kg   SpO2 91%   BMI 25.41 kg/m   Intake/Output Summary (Last 24 hours) at 02/09/2021 0701 Last data filed at 02/08/2021 1700 Gross per 24 hour  Intake --  Output 400 ml  Net -400 ml   Weight change: -1.5 kg  Physical Exam: NAD, sitting in bed, NAD HEENT EOMI   NECK no JVD PULM better air movement today CV RRR ABD soft EXT 1+ LE edema NEURO AAO x 3 nonfocal ACCESS: L BBT + T/B  Imaging: No results found.  Labs: BMET Recent Labs  Lab 02/03/21 0147 02/04/21 0026 02/05/21 0653 02/06/21 0820 02/07/21 0151 02/08/21 0217 02/09/21 0111  NA 135 135 135  136 134* 136 134*  K 4.7 4.6 5.4* 5.3* 5.0 4.1 4.1  CL 104 105 105 106 105 104 102  CO2 24 21* 21* 23 20* 25 27  GLUCOSE 152* 146* 122* 139* 95 149* 153*  BUN 68* 70* 79* 81* 81* 52* 55*  CREATININE 6.05* 5.70* 5.91* 6.40* 6.59* 5.07* 5.60*  CALCIUM 7.4* 7.6* 7.8* 7.7* 7.7* 7.6* 7.6*  PHOS  --   --   --   --   --   --  5.0*   CBC Recent Labs  Lab 02/06/21 0820 02/07/21 0151 02/08/21 0217 02/09/21 0111  WBC 4.9 4.8 4.6 4.6  HGB 7.4* 7.4* 7.0* 7.0*  HCT 23.1* 22.4* 21.9* 21.7*  MCV 89.5 91.8 90.5 89.7  PLT 181 196 191 195    Medications:    . sodium chloride   Intravenous Once  . apixaban  5 mg Oral BID  . calcitRIOL  0.25 mcg Oral Q M,W,F  . carvedilol  25 mg Oral BID  . Chlorhexidine Gluconate Cloth  6 each Topical Q0600  . cholecalciferol  2,000 Units Oral Daily  . darbepoetin (ARANESP) injection - NON-DIALYSIS  100 mcg Subcutaneous Once  . [  START ON 02/12/2021] darbepoetin (ARANESP) injection - NON-DIALYSIS  200 mcg Subcutaneous Q Sun-1800  . feeding supplement  237 mL Oral BID BM  . furosemide  80 mg Oral TID  . gabapentin  300 mg Oral BID  . guaiFENesin  1,200 mg Oral BID  . hydrALAZINE  100 mg Oral Q8H  . insulin aspart  0-9 Units Subcutaneous TID WC  . isosorbide mononitrate  60 mg Oral Daily  . linaclotide  145 mcg Oral QAC breakfast  . mouth rinse  15 mL Mouth Rinse BID  . montelukast  10 mg Oral QHS  . ofloxacin  1 drop Both Eyes Daily  . pantoprazole  40 mg Oral Daily  . polyethylene glycol  34 g Oral Daily  . rosuvastatin  10 mg Oral q1800  . tamsulosin  0.4 mg Oral QHS  . vitamin B-12  2,000 mcg Oral Daily      Otelia Santee, MD 02/09/2021, 7:01 AM

## 2021-02-09 NOTE — Progress Notes (Signed)
Rounded on patient today in correlation to transition to outpatient HD. Ordered Kidney Failure book. Patient educated at the bedside regarding care of AV fistula site care, assessment of thrill daily and proper medication administration on HD days.  Patient also educated on the importance of adhering to scheduled dialysis treatments, the effects of fluid overload, hyperkalemia and hyperphosphatemia. Patient capable of re-verbalizing via teach back method. Also educated patient on services available through the interdisciplinary team in the clinic setting. Patient particularly interested in the clinic dietician as he verbalizes this has been a problem for him throughout his health history. Patient with no further questions at this time. Handouts and contact information provided to patient for any further assistance. Patient plan to discharge home this evening.  Dorthey Sawyer, RN  Dialysis Nurse Coordinator Phone: [phone]

## 2021-02-09 NOTE — Progress Notes (Signed)
Patient discharged home. Discharge instructions explained, patient verbalizes understanding.  Patient discharged with home oxygen.

## 2021-02-09 NOTE — Progress Notes (Addendum)
[facility] application completed with patient and submitted for pre-certification. Patient aware of plan to discharge this afternoon, sign paperwork at [facility] tomorrow, start first treatment at [facility] on Saturday, 4/16 with an 11:40am arrival. Patient can repeat back all instructions to demonstrate understanding and is very appreciative of support and assistance of Navigator. He has diet questions. We briefly discussed this, mostly fluid restrictions, and Navigator requests that HD RN Coordinator speak with patient prior to discharge today.  Patient states that his niece or nephew can take him to the clinic tomorrow for paperwork signing and that his daughter is coming in to town for the weekend and can take him to his first treatment Saturday. [facility] will be available to him for treatment Tuesday if needed and he understands how to call to schedule. Navigator asked Renal PA to send orders.   William Webb, [place] [facility] Navigator [phone]

## 2021-02-09 NOTE — Discharge Summary (Addendum)
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 NEGATIVE NEGATIVE Final    Comment: (NOTE) SARS-CoV-2 target nucleic acids are NOT DETECTED.  The SARS-CoV-2 RNA is generally detectable in upper respiratory specimens during the acute phase of infection. The lowest concentration of SARS-CoV-2 viral copies this assay can detect is 138 copies/mL. A negative result does not preclude SARS-Cov-2 infection and should not be used as the sole basis for treatment or other patient management decisions. A negative result may occur with  improper specimen collection/handling, submission of specimen other than nasopharyngeal swab, presence of viral mutation(s) within the areas targeted by this assay, and inadequate number of viral copies(<138 copies/mL). A negative result must be combined with clinical observations, patient history, and epidemiological information. The expected result is Negative.  Fact Sheet for Patients:   EntrepreneurPulse.com.au  Fact Sheet for Healthcare Providers:  IncredibleEmployment.be  This test is no t yet approved or cleared by the [place] FDA and  has been authorized for detection and/or diagnosis of SARS-CoV-2  by FDA under an Emergency Use Authorization (EUA). This EUA will remain  in effect (meaning this test can be used) for the duration of the COVID-19 declaration under Section 564(b)(1) of the Act, 21 U.S.C.section 360bbb-3(b)(1), unless the authorization is terminated  or revoked sooner.       Influenza A by PCR NEGATIVE NEGATIVE Final   Influenza B by PCR NEGATIVE NEGATIVE Final    Comment: (NOTE) The Xpert Xpress SARS-CoV-2/FLU/RSV plus assay is intended as an aid in the diagnosis of influenza from Nasopharyngeal swab specimens and should not be used as a sole basis for treatment. Nasal washings and aspirates are unacceptable for Xpert Xpress SARS-CoV-2/FLU/RSV testing.  Fact Sheet for Patients: EntrepreneurPulse.com.au  Fact Sheet for Healthcare Providers: IncredibleEmployment.be  This test is not yet approved or cleared by the [place] FDA and has been authorized for detection and/or diagnosis of SARS-CoV-2 by FDA under an Emergency Use Authorization (EUA). This EUA will remain in effect (meaning this test can be used) for the duration of the COVID-19 declaration under Section 564(b)(1) of the Act, 21 U.S.C. section 360bbb-3(b)(1), unless the authorization is terminated or revoked.  Performed at [facility], [place] [address] [place], [place] [address]   Culture, body fluid w Gram Stain-bottle     Status: None   Collection Time: 02/03/21 12:22 PM   Specimen: Fluid  Result Value Ref Range Status   Specimen Description FLUID RIGHT PLEURAL  Final   Special Requests BOTTLES DRAWN AEROBIC AND ANAEROBIC 10CC  Final   Culture   Final    NO GROWTH 5 DAYS Performed at  [facility], [place] [address] [place], [place] [address]    Report Status 02/08/2021 FINAL  Final  Gram stain     Status: None   Collection Time: 02/03/21 12:22 PM   Specimen: Fluid  Result Value Ref Range Status   Specimen Description FLUID RIGHT PLEURAL  Final   Special Requests NONE  Final   Gram Stain   Final    FEW WBC PRESENT, PREDOMINANTLY MONONUCLEAR NO ORGANISMS SEEN Performed at [facility], [place] [address] [place], [place] [address]    Report Status 02/03/2021 FINAL  Final  Expectorated Sputum Assessment w Gram Stain, Rflx to Resp Cult     Status: None   Collection Time: 02/03/21  3:03 PM   Specimen: Expectorated Sputum  Result Value Ref Range Status   Specimen Description EXPECTORATED SPUTUM  Final   Special Requests NONE  Final   Sputum evaluation   Final    THIS SPECIMEN IS ACCEPTABLE FOR SPUTUM CULTURE Performed at [facility], [place] [address], [place], [place] [address]    Report Status 02/07/2021 FINAL  Final  Culture, Respiratory w Gram Stain     Status: None (Preliminary result)   Collection Time: 02/03/21  3:03 PM  Result Value Ref Range Status   Specimen Description EXPECTORATED SPUTUM  Final   Special Requests NONE Reflexed from [id number]  Final   Gram Stain   Final    FEW WBC PRESENT, PREDOMINANTLY PMN FEW GRAM POSITIVE COCCI RARE GRAM POSITIVE RODS    Culture   Final    MODERATE STAPHYLOCOCCUS EPIDERMIDIS CULTURE REINCUBATED FOR BETTER GROWTH Performed at [facility], [place] [address] [place], [place] [address]    Report Status PENDING  Incomplete    FURTHER DISCHARGE INSTRUCTIONS:  Get Medicines reviewed and adjusted: Please take all your medications with you for your next  visit with your Primary MD  Laboratory/radiological data: Please request your Primary MD to go over all hospital tests and procedure/radiological results at the follow up, please ask your Primary MD to get all Hospital records sent to his/her  office.  In some cases, they will be blood work, cultures and biopsy results pending at the time of your discharge. Please request that your primary care M.D. goes through all the records of your hospital data and follows up on these results.  Also Note the following: If you experience worsening of your admission symptoms, develop shortness of breath, life threatening emergency, suicidal or homicidal thoughts you must seek medical attention immediately by calling 911 or calling your MD immediately  if symptoms less severe.  You must read complete instructions/literature along with all the possible adverse reactions/side effects for all the Medicines you take and that have been prescribed to you. Take any new Medicines after you have completely understood and accpet all the possible adverse reactions/side effects.   Do not drive when taking Pain medications or sleeping medications (Benzodaizepines)  Do not take more than prescribed Pain, Sleep and Anxiety Medications. It is not advisable to combine anxiety,sleep and pain medications without talking with your primary care practitioner  Special Instructions: If you have smoked or chewed Tobacco  in the last 2 yrs please stop smoking, stop any regular Alcohol  and or any Recreational drug use.  Wear Seat belts while driving.  Please note: You were cared for by a hospitalist during your hospital stay. Once you are discharged, your primary care physician will handle any further medical issues. Please note that NO REFILLS for any discharge medications will be authorized once you are discharged, as it is imperative that you return to your primary care physician (or establish a relationship with a primary care physician if you do not have one) for your post hospital discharge needs so that they can reassess your need for medications and monitor your lab values.  Total Time spent coordinating discharge including counseling, education and face to face time  equals 35 minutes.  SignedOren Binet 02/09/2021 3:13 PM

## 2021-02-09 NOTE — Progress Notes (Signed)
SATURATION QUALIFICATIONS: (This note is used to comply with regulatory documentation for home oxygen)  Patient Saturations on Room Air at Rest = 93%  Patient Saturations on Room Air while Ambulating = 87%  Patient Saturations on 2 Liters of oxygen while Ambulating = 95%  Please briefly explain why patient needs home oxygen:

## 2021-02-10 LAB — CULTURE, RESPIRATORY W GRAM STAIN: Culture: NORMAL

## 2021-02-11 ENCOUNTER — Other Ambulatory Visit: Payer: Self-pay | Admitting: Family Medicine

## 2021-02-11 DIAGNOSIS — J302 Other seasonal allergic rhinitis: Secondary | ICD-10-CM

## 2021-02-11 LAB — BPAM RBC
Blood Product Expiration Date: 202205082359
ISSUE DATE / TIME: 202204141038
Unit Type and Rh: 6200

## 2021-02-11 LAB — TYPE AND SCREEN
ABO/RH(D): A POS
Antibody Screen: NEGATIVE
Unit division: 0

## 2021-02-11 NOTE — TOC Initial Note (Signed)
Transition of Care Contact from [place]  Date of Discharge: 02/09/2021 Date of Contact: 02/11/2021 Method: Phone Spoke to: Patient  Patient contacted to discuss transition of care from recent inpatient hospitalization. 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  Medications were reviewed with patient's daughter. She reports patient has all his medications and has made follow-up appointment with PCP. Also reports potable O2 was delivered.  Tobie Poet, NP

## 2021-02-13 ENCOUNTER — Other Ambulatory Visit: Payer: Self-pay

## 2021-02-13 DIAGNOSIS — J302 Other seasonal allergic rhinitis: Secondary | ICD-10-CM

## 2021-02-13 MED ORDER — MONTELUKAST SODIUM 10 MG PO TABS: ORAL_TABLET | ORAL | 0 refills | Status: DC

## 2021-02-16 ENCOUNTER — Encounter (HOSPITAL_COMMUNITY): Payer: BC Managed Care – PPO

## 2021-02-16 ENCOUNTER — Other Ambulatory Visit: Payer: Self-pay | Admitting: Family Medicine

## 2021-02-16 DIAGNOSIS — I132 Hypertensive heart and chronic kidney disease with heart failure and with stage 5 chronic kidney disease, or end stage renal disease: Secondary | ICD-10-CM | POA: Diagnosis not present

## 2021-02-16 DIAGNOSIS — N186 End stage renal disease: Secondary | ICD-10-CM | POA: Diagnosis not present

## 2021-02-16 DIAGNOSIS — J302 Other seasonal allergic rhinitis: Secondary | ICD-10-CM

## 2021-02-16 DIAGNOSIS — I5032 Chronic diastolic (congestive) heart failure: Secondary | ICD-10-CM | POA: Diagnosis not present

## 2021-02-16 DIAGNOSIS — I2510 Atherosclerotic heart disease of native coronary artery without angina pectoris: Secondary | ICD-10-CM | POA: Diagnosis not present

## 2021-02-16 DIAGNOSIS — E1122 Type 2 diabetes mellitus with diabetic chronic kidney disease: Secondary | ICD-10-CM | POA: Diagnosis not present

## 2021-02-16 DIAGNOSIS — J9601 Acute respiratory failure with hypoxia: Secondary | ICD-10-CM | POA: Diagnosis not present

## 2021-02-16 DIAGNOSIS — D631 Anemia in chronic kidney disease: Secondary | ICD-10-CM | POA: Diagnosis not present

## 2021-02-16 DIAGNOSIS — E1140 Type 2 diabetes mellitus with diabetic neuropathy, unspecified: Secondary | ICD-10-CM | POA: Diagnosis not present

## 2021-02-16 DIAGNOSIS — I252 Old myocardial infarction: Secondary | ICD-10-CM | POA: Diagnosis not present

## 2021-02-21 DIAGNOSIS — E1140 Type 2 diabetes mellitus with diabetic neuropathy, unspecified: Secondary | ICD-10-CM | POA: Diagnosis not present

## 2021-02-21 DIAGNOSIS — E1122 Type 2 diabetes mellitus with diabetic chronic kidney disease: Secondary | ICD-10-CM | POA: Diagnosis not present

## 2021-02-21 DIAGNOSIS — I252 Old myocardial infarction: Secondary | ICD-10-CM | POA: Diagnosis not present

## 2021-02-21 DIAGNOSIS — N186 End stage renal disease: Secondary | ICD-10-CM | POA: Diagnosis not present

## 2021-02-21 DIAGNOSIS — I5032 Chronic diastolic (congestive) heart failure: Secondary | ICD-10-CM | POA: Diagnosis not present

## 2021-02-21 DIAGNOSIS — J9601 Acute respiratory failure with hypoxia: Secondary | ICD-10-CM | POA: Diagnosis not present

## 2021-02-21 DIAGNOSIS — I132 Hypertensive heart and chronic kidney disease with heart failure and with stage 5 chronic kidney disease, or end stage renal disease: Secondary | ICD-10-CM | POA: Diagnosis not present

## 2021-02-21 DIAGNOSIS — D631 Anemia in chronic kidney disease: Secondary | ICD-10-CM | POA: Diagnosis not present

## 2021-02-21 DIAGNOSIS — I2510 Atherosclerotic heart disease of native coronary artery without angina pectoris: Secondary | ICD-10-CM | POA: Diagnosis not present

## 2021-02-23 ENCOUNTER — Other Ambulatory Visit: Payer: Self-pay | Admitting: Family Medicine

## 2021-02-23 ENCOUNTER — Ambulatory Visit
Admission: RE | Admit: 2021-02-23 | Discharge: 2021-02-23 | Disposition: A | Discharge status: Home / Self Care | Payer: BC Managed Care – PPO | Source: Ambulatory Visit | Attending: Family Medicine | Admitting: Family Medicine

## 2021-02-23 DIAGNOSIS — J90 Pleural effusion, not elsewhere classified: Secondary | ICD-10-CM

## 2021-02-23 DIAGNOSIS — R0602 Shortness of breath: Secondary | ICD-10-CM | POA: Diagnosis not present

## 2021-02-24 ENCOUNTER — Telehealth: Payer: Self-pay | Admitting: Student

## 2021-02-24 DIAGNOSIS — R001 Bradycardia, unspecified: Secondary | ICD-10-CM

## 2021-02-24 MED ORDER — CARVEDILOL 6.25 MG PO TABS: 6.2500 mg | ORAL_TABLET | Freq: Two times a day (BID) | ORAL | 3 refills | Status: DC

## 2021-02-24 NOTE — Telephone Encounter (Signed)
ON-CALL CARDIOLOGY 02/24/21  Patient's name: William Webb [facility].   MRN: [id number].    DOB: 1951-02-09 Primary care provider: Alroy Dust, L.Marlou Sa, MD. Primary cardiologist: Dr. Jorge Ny regarding this patient's care today: Patient called concerned because while at dialysis his heart rate was noted to be 40-45 bpm and patient was experiencing shortness of breath and dizziness.  He states this has been occurring over the last few weeks.  He is presently taking carvedilol 12.5 mg twice daily.  Denies chest pain, syncope, near syncope.  Impression:   ICD-10-CM   1. Symptomatic bradycardia  R00.1     Meds ordered this encounter  Medications  . carvedilol (COREG) 6.25 MG tablet    Sig: Take 1 tablet (6.25 mg total) by mouth 2 (two) times daily.    Dispense:  60 tablet    Refill:  3    No orders of the defined types were placed in this encounter.   Recommendations: Advised patient to reduce carvedilol to 6.25 mg twice daily.  Continue to monitor symptoms and notify our office if symptoms do not improve.  Notably he has an appointment scheduled with Dr. Virgina Jock in the office next week, advised him to be sure to keep this appointment.  Telephone encounter total time: 9 minutes     Alethia Berthold, PA-C 02/24/2021, 5:31 PM Office: [phone]

## 2021-02-25 DIAGNOSIS — N186 End stage renal disease: Secondary | ICD-10-CM | POA: Diagnosis not present

## 2021-02-25 DIAGNOSIS — E1129 Type 2 diabetes mellitus with other diabetic kidney complication: Secondary | ICD-10-CM | POA: Diagnosis not present

## 2021-02-25 DIAGNOSIS — Z992 Dependence on renal dialysis: Secondary | ICD-10-CM | POA: Diagnosis not present

## 2021-02-27 ENCOUNTER — Other Ambulatory Visit (HOSPITAL_COMMUNITY): Payer: Self-pay | Admitting: Family Medicine

## 2021-02-27 ENCOUNTER — Other Ambulatory Visit: Payer: Self-pay | Admitting: Family Medicine

## 2021-02-27 DIAGNOSIS — J90 Pleural effusion, not elsewhere classified: Secondary | ICD-10-CM

## 2021-02-28 DIAGNOSIS — I2510 Atherosclerotic heart disease of native coronary artery without angina pectoris: Secondary | ICD-10-CM | POA: Diagnosis not present

## 2021-02-28 DIAGNOSIS — I252 Old myocardial infarction: Secondary | ICD-10-CM | POA: Diagnosis not present

## 2021-02-28 DIAGNOSIS — D631 Anemia in chronic kidney disease: Secondary | ICD-10-CM | POA: Diagnosis not present

## 2021-02-28 DIAGNOSIS — E1122 Type 2 diabetes mellitus with diabetic chronic kidney disease: Secondary | ICD-10-CM | POA: Diagnosis not present

## 2021-02-28 DIAGNOSIS — N186 End stage renal disease: Secondary | ICD-10-CM | POA: Diagnosis not present

## 2021-02-28 DIAGNOSIS — I5032 Chronic diastolic (congestive) heart failure: Secondary | ICD-10-CM | POA: Diagnosis not present

## 2021-02-28 DIAGNOSIS — E1140 Type 2 diabetes mellitus with diabetic neuropathy, unspecified: Secondary | ICD-10-CM | POA: Diagnosis not present

## 2021-02-28 DIAGNOSIS — J9601 Acute respiratory failure with hypoxia: Secondary | ICD-10-CM | POA: Diagnosis not present

## 2021-02-28 DIAGNOSIS — I132 Hypertensive heart and chronic kidney disease with heart failure and with stage 5 chronic kidney disease, or end stage renal disease: Secondary | ICD-10-CM | POA: Diagnosis not present

## 2021-03-01 DIAGNOSIS — I4819 Other persistent atrial fibrillation: Secondary | ICD-10-CM | POA: Diagnosis not present

## 2021-03-02 ENCOUNTER — Other Ambulatory Visit: Payer: Self-pay

## 2021-03-02 ENCOUNTER — Encounter: Payer: Self-pay | Admitting: Cardiology

## 2021-03-02 ENCOUNTER — Ambulatory Visit: Payer: BC Managed Care – PPO | Admitting: Cardiology

## 2021-03-02 VITALS — BP 166/76 | HR 55 | Temp 98.0°F | Resp 17 | Ht 75.0 in | Wt 198.0 lb

## 2021-03-02 DIAGNOSIS — I10 Essential (primary) hypertension: Secondary | ICD-10-CM

## 2021-03-02 DIAGNOSIS — Z992 Dependence on renal dialysis: Secondary | ICD-10-CM | POA: Diagnosis not present

## 2021-03-02 DIAGNOSIS — I2510 Atherosclerotic heart disease of native coronary artery without angina pectoris: Secondary | ICD-10-CM | POA: Diagnosis not present

## 2021-03-02 DIAGNOSIS — N186 End stage renal disease: Secondary | ICD-10-CM | POA: Diagnosis not present

## 2021-03-02 DIAGNOSIS — I739 Peripheral vascular disease, unspecified: Secondary | ICD-10-CM | POA: Diagnosis not present

## 2021-03-02 NOTE — Progress Notes (Signed)
Patient is here for follow up visit.  Subjective:   William Webb, male    DOB: 10/18/1951, 70 y.o.   MRN: [id number]   Chief Complaint  Patient presents with  . Coronary Artery Disease  . Hypertension  . Hospitalization Follow-up    Low oxygen   HPI  70 y.o.African American male with hypertension, hyperlipidemia, type 2 DM, CAD, PAD, ESRD no won HD, h/o cardioversion for persistent Afib, fall (08/2020), small pericardial effusion, pulmonary hypertension  Patient was recently hospitalized with hypoxia, treated for pneumonia. During this hospitalization, his renal function was also noted to further deteriorate from baseline CKD IV/V. He already had a LUE AVF. He was started on hemodialysis, tolerating well. Blood pressure control improving. He was hypoxic on discharge with recent pneumonia, underlying pulmonary hypertension. He has a portable oxygen cylinder, but is not working well. He is going to call help line number. O2 sats currently 90% on room air.   Patient was here for routine office visit today.  He has had subjective fever and chills, along with shortness of breath for last 2 3 days.  He has also had increasing orthopnea.  He was very dizzy and unstable on his feet in the office this morning.  He had left sided burning sensation on lower chest/upper abdomen for 2 hours, which has since resolved.    Current Outpatient Medications on File Prior to Visit  Medication Sig Dispense Refill  . acetaminophen (TYLENOL) 650 MG CR tablet Take 1,300 mg by mouth every 8 (eight) hours as needed for pain.    . Alcohol Swabs (B-D SINGLE USE SWABS REGULAR) PADS     . Blood Glucose Monitoring Suppl (ACCU-CHEK GUIDE ME) w/Device KIT 1 Device by Does not apply route 4 (four) times daily -  before meals and at bedtime. 1 kit 0  . calcitRIOL (ROCALTROL) 0.25 MCG capsule Take 0.25 mcg by mouth every Monday, Wednesday, and Friday.    . carvedilol (COREG) 6.25 MG tablet Take 1 tablet  (6.25 mg total) by mouth 2 (two) times daily. 60 tablet 3  . Cholecalciferol (VITAMIN D-3) 125 MCG (5000 UT) TABS Take 5,000 mcg by mouth daily.    . Continuous Blood Gluc Sensor (DEXCOM G6 SENSOR) MISC 1 Device by Does not apply route See admin instructions. Change every 10 days 9 each 3  . Continuous Blood Gluc Sensor (DEXCOM G6 SENSOR) MISC 1 Device by Does not apply route See admin instructions. Change every 10 days 9 each 3  . Cyanocobalamin 2500 MCG TABS Take 5,000 mcg by mouth daily. Vitamin b12    . Dextromethorphan-guaiFENesin (CORICIDIN HBP CONGESTION/COUGH) 10-200 MG CAPS Take 2 capsules by mouth daily as needed (cough/congestion).    Marland Kitchen ELIQUIS 5 MG TABS tablet TAKE 1 TABLET BY MOUTH TWICE A DAY (Patient taking differently: Take 5 mg by mouth 2 (two) times daily.) 60 tablet 3  . furosemide (LASIX) 80 MG tablet Take 80 mg by mouth See admin instructions. Take 80 mg by mouth twice a day and additional 80 mg before bedtime as needed for swelling of the ankles   Patient states taking 36m 3x/day    . gabapentin (NEURONTIN) 600 MG tablet Take 0.5 tablets (300 mg total) by mouth 2 (two) times daily. (Patient taking differently: Take 600 mg by mouth 2 (two) times daily.) 60 tablet 0  . glucose 4 GM chewable tablet Chew 1 tablet by mouth as needed for low blood sugar.    . hydrALAZINE (APRESOLINE)  100 MG tablet Take 100 mg by mouth 3 (three) times daily.    . isosorbide mononitrate (IMDUR) 60 MG 24 hr tablet Take 1 tablet (60 mg total) by mouth daily. 30 tablet 0  . ketorolac (ACULAR) 0.5 % ophthalmic solution Place 1 drop into both eyes daily as needed (inflamation).    Marland Kitchen linaclotide (LINZESS) 145 MCG CAPS capsule Take 1 capsule (145 mcg total) by mouth daily as needed (constipation). 30 capsule 0  . methocarbamol (ROBAXIN) 500 MG tablet Take 500 mg by mouth daily.    . montelukast (SINGULAIR) 10 MG tablet TAKE 1 TABLET BY MOUTH EVERYDAY AT BEDTIME 90 tablet 0  . NOVOLOG FLEXPEN 100 UNIT/ML  FlexPen INJECT 3 UNITS INTO THE SKIN 3 (THREE) TIMES DAILY WITH MEALS. (Patient taking differently: Inject 3-5 Units into the skin 3 (three) times daily with meals. As needed) 15 mL 11  . ofloxacin (OCUFLOX) 0.3 % ophthalmic solution Place 1 drop into both eyes daily.    Marland Kitchen omeprazole (PRILOSEC) 20 MG capsule Take 20 mg by mouth daily.    . ondansetron (ZOFRAN) 4 MG tablet Take 1 tablet (4 mg total) by mouth every 8 (eight) hours as needed for nausea. 20 tablet 0  . prednisoLONE acetate (PRED FORTE) 1 % ophthalmic suspension Place 1 drop into both eyes daily as needed (inflammation).    . rosuvastatin (CRESTOR) 20 MG tablet TAKE 1 TABLET (20 MG TOTAL) BY MOUTH DAILY AT 6 PM. (Patient taking differently: Take 20 mg by mouth at bedtime.) 90 tablet 1  . sevelamer carbonate (RENVELA) 800 MG tablet Take 800 mg by mouth 3 (three) times daily.    . sildenafil (VIAGRA) 100 MG tablet Take 1 tablet (100 mg total) by mouth daily as needed for erectile dysfunction. 10 tablet 11  . tamsulosin (FLOMAX) 0.4 MG CAPS capsule Take 0.4 mg by mouth at bedtime.    Sallye Lat (VISINE ADVANCED RELIEF) 0.05-0.1-1-1 % SOLN Place 1 drop into both eyes 3 (three) times daily as needed. Taking as needed per pt    . vitamin E (VITAMIN E) 1000 UNIT capsule Take 1,000 Units by mouth daily.      No current facility-administered medications on file prior to visit.    Cardiovascular studies:  EKG 02/01/2021: Sinus rhythm 91 bpm Normal EKG  Mobile cardiac telemetry 13 days 09/19/2020 - 10/03/2020: Dominant rhythm: Sinus. HR 58-95 bpm. Avg HR 71 bpm, while in sinus rhythm. 33 episodes of SVT, fastest at 132 bpm for 5 beats, longest for 20 beats at 113 bpm. 4,4% isolated SVE, <1% couplet/triplets. 11 episodes of NSVT, fastest at 222 bpm for 7 beats, longest for 19 beats at 145 bpm. <1% isolated VE, couplet/triplets. No atrial fibrillation/atrial flutter//high grade AV block, sinus pause >3sec noted. 0  patient triggered events.     Lower Extremity Arterial Duplex 01/18/2020:  No hemodynamically significant stenosis noted in the bilateral common and  superficial femoral arteries.  Bilateral anterior and post tibial spectral waveform demonstrates a  monophasic flow pattern suggestive of severe diffuse small vessel disease.  This exam reveals normal perfusion of the right and left lower extremity  (ABI 0.97).   The ABI may be falsely elevated in a patient with arteriosclerosis.  Consider further work up for CLI if clinically indicated.  EKG 01/08/2020: Sinus rhythm 66 bpm. Left ventricular hypertrophy. ST-T changes related to LVH.  Coronary angiogram 10/20/2018: LM: Normal LAD: Distal 70% diffuse disease.        Ostial 90% stenosis in  Diag1. Patent prox Diag 1 stent LCx: Large OM1 with patent prior stent RCA: 100% occluded RPDA        Partially successful PTCA attempt        100%--99% stenosis        TIMI flow 0-I  LVEDP 16 mmHg  Echocardiogram 10/17/2018: Left ventricle: The cavity size was normal. Wall thickness was increased in a pattern of severe LVH. Systolic function was normal. The estimated ejection fraction was in the range of 50% to 55%. Wall motion was normal; there were no regional wall motion abnormalities. Doppler parameters are consistent with anormal left ventricular relaxation (grade 1 diastolic dysfunction). Doppler parameters are consistent with high ventricular filling pressure. - Left atrium: The atrium was moderately dilated. - Right atrium: The atrium was mildly dilated.  Impressions: - Low normal to mildly reduced LV systolic function; EF 50; severe LVH; mild diastolic dysfunction; biatrial enlargement.  Recent Labs: 01/19/2021: Hb 11.1 HbA1C 6.3% TSH 2.2  10/27/2020: Hb 11.1  09/17/2020: Glucose 137, BUN/Cr 35/4.03. EGFR 15. Na/K 137/4.2.   08/02/2020: Glucose 155, BUN/Cr 35/4.1. EGFR 16. Na/K 137/3.6.  H/H 8.8/26.5. MCV 87. Platelets  177 HbA1C 7.3%   03/01/2020: Hemoglobin 11  Iron 60, TIBC 231.  01/06/2020: Glucose 173, BUN/Cr 43/2.9.  Na/K 137/4.8. Rest of the CMP normal  12/11/2019: Glucose 151. BUN/Cr 28/2.6. HbA1C 7.7%.   11//2020: Glucose 177, BUN/Cr 27/2.72. EGFR 23. Na/K 140/4.5. Albumin 2.4 low. Rest of the CMP normal H/H 8.9/27.8. MCV 86. Platelets 177 HbA1C 8.0%   02/2018: Chol 162, TG 95, HDL 37, LDL 105  Review of Systems  Constitutional: Negative for malaise/fatigue.  Cardiovascular: Positive for chest pain (Now resolved), dyspnea on exertion and leg swelling (Improved). Negative for palpitations and syncope.       LE ulcer   Respiratory: Positive for shortness of breath.   Genitourinary:       Erectile dysfunction  All other systems reviewed and are negative.      Objective:    Vitals:   03/02/21 1149  BP: (!) 166/76  Pulse: (!) 55  Resp: 17  Temp: 98 F (36.7 C)  SpO2: 90%      Physical Exam Vitals and nursing note reviewed.  Constitutional:      General: He is not in acute distress. Neck:     Vascular: No JVD.  Cardiovascular:     Rate and Rhythm: Normal rate. Rhythm irregularly irregular.     Pulses:          Dorsalis pedis pulses are 0 on the right side and 0 on the left side.       Posterior tibial pulses are 0 on the right side and 0 on the left side.     Heart sounds: Murmur (Flow murmur likely due to LUE AV fistula) heard.      Comments: No ulcer, gangrene Pulmonary:     Effort: Pulmonary effort is normal.     Breath sounds: Normal breath sounds. No wheezing or rales.  Musculoskeletal:     Right lower leg: No edema    Left lower leg: No edema        Assessment & Recommendations:   70 y.o.African American male with hypertension, hyperlipidemia, type 2 DM, CAD, PAD, ESRD no won HD, h/o cardioversion for persistent Afib, fall (08/2020), small pericardial effusion, pulmonary hypertension  PAD: Severe PAD without critical limb ischemia.  Continue  current medical therapy, including, statin Not on Aspirin due to ongoing use of eliquis and  prior h/o GI bleeding.   Atrial fibrillation: H/o persistent, now in sinus rhythm s/p cardioversion 09/29/2019. CHA2DS2VASc score 4, annual stroke risk 5%. Continue eliquis 5 mg bid.   CAD: CAD s/p prior PCI, NSTEMI in 09/2018, attempted but unsuccessful. PTCA to chronically occluded Rt PDA Stable without angina symptoms.   Hypertension: Uncontrolled, primarily related to advanced CKD. Improving since being on dialysis.  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 Has portable oxygen cylinder at home, needs to troubleshoot with the company since it is not working.   CKD: Continue follow-up with nephrology  Mild bilateral carotid stenosis: Continue medical management  F/u in 3 months  William Wimmer Esther Hardy, MD [facility] Pager: [phone] Office: [phone] If no answer Cell [phone]

## 2021-03-03 ENCOUNTER — Encounter: Payer: Self-pay | Admitting: Cardiology
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
## 2021-03-07 ENCOUNTER — Encounter (INDEPENDENT_AMBULATORY_CARE_PROVIDER_SITE_OTHER): Payer: Medicare HMO | Admitting: Ophthalmology

## 2021-03-07 ENCOUNTER — Other Ambulatory Visit (HOSPITAL_COMMUNITY)
Admission: RE | Admit: 2021-03-07 | Discharge: 2021-03-07 | Disposition: A | Discharge status: Home / Self Care | Payer: BC Managed Care – PPO | Source: Ambulatory Visit | Attending: Family Medicine | Admitting: Family Medicine

## 2021-03-07 ENCOUNTER — Telehealth: Payer: Self-pay

## 2021-03-07 DIAGNOSIS — H43822 Vitreomacular adhesion, left eye: Secondary | ICD-10-CM

## 2021-03-07 DIAGNOSIS — Z20822 Contact with and (suspected) exposure to covid-19: Secondary | ICD-10-CM | POA: Diagnosis not present

## 2021-03-07 DIAGNOSIS — E1140 Type 2 diabetes mellitus with diabetic neuropathy, unspecified: Secondary | ICD-10-CM | POA: Diagnosis not present

## 2021-03-07 DIAGNOSIS — I132 Hypertensive heart and chronic kidney disease with heart failure and with stage 5 chronic kidney disease, or end stage renal disease: Secondary | ICD-10-CM | POA: Diagnosis not present

## 2021-03-07 DIAGNOSIS — N186 End stage renal disease: Secondary | ICD-10-CM | POA: Diagnosis not present

## 2021-03-07 DIAGNOSIS — D631 Anemia in chronic kidney disease: Secondary | ICD-10-CM | POA: Diagnosis not present

## 2021-03-07 DIAGNOSIS — H4312 Vitreous hemorrhage, left eye: Secondary | ICD-10-CM

## 2021-03-07 DIAGNOSIS — E113512 Type 2 diabetes mellitus with proliferative diabetic retinopathy with macular edema, left eye: Secondary | ICD-10-CM

## 2021-03-07 DIAGNOSIS — H25813 Combined forms of age-related cataract, bilateral: Secondary | ICD-10-CM

## 2021-03-07 DIAGNOSIS — Z961 Presence of intraocular lens: Secondary | ICD-10-CM

## 2021-03-07 DIAGNOSIS — I252 Old myocardial infarction: Secondary | ICD-10-CM | POA: Diagnosis not present

## 2021-03-07 DIAGNOSIS — H18513 Endothelial corneal dystrophy, bilateral: Secondary | ICD-10-CM

## 2021-03-07 DIAGNOSIS — H3581 Retinal edema: Secondary | ICD-10-CM

## 2021-03-07 DIAGNOSIS — Z01812 Encounter for preprocedural laboratory examination: Secondary | ICD-10-CM | POA: Diagnosis present

## 2021-03-07 DIAGNOSIS — I10 Essential (primary) hypertension: Secondary | ICD-10-CM

## 2021-03-07 DIAGNOSIS — I5032 Chronic diastolic (congestive) heart failure: Secondary | ICD-10-CM | POA: Diagnosis not present

## 2021-03-07 DIAGNOSIS — I2510 Atherosclerotic heart disease of native coronary artery without angina pectoris: Secondary | ICD-10-CM | POA: Diagnosis not present

## 2021-03-07 DIAGNOSIS — E113411 Type 2 diabetes mellitus with severe nonproliferative diabetic retinopathy with macular edema, right eye: Secondary | ICD-10-CM

## 2021-03-07 DIAGNOSIS — H40003 Preglaucoma, unspecified, bilateral: Secondary | ICD-10-CM

## 2021-03-07 DIAGNOSIS — J9601 Acute respiratory failure with hypoxia: Secondary | ICD-10-CM | POA: Diagnosis not present

## 2021-03-07 DIAGNOSIS — H25812 Combined forms of age-related cataract, left eye: Secondary | ICD-10-CM

## 2021-03-07 DIAGNOSIS — E1122 Type 2 diabetes mellitus with diabetic chronic kidney disease: Secondary | ICD-10-CM | POA: Diagnosis not present

## 2021-03-07 DIAGNOSIS — H35033 Hypertensive retinopathy, bilateral: Secondary | ICD-10-CM

## 2021-03-07 LAB — SARS CORONAVIRUS 2 (TAT 6-24 HRS): SARS Coronavirus 2: NEGATIVE

## 2021-03-07 NOTE — Telephone Encounter (Signed)
Inform the front stuff to schedule pt for a 6 min walk

## 2021-03-07 NOTE — Telephone Encounter (Signed)
Will need to perform 6 min walk test in office to document oxygen sat and order oxygen.  Thanks MJP

## 2021-03-09 ENCOUNTER — Other Ambulatory Visit (HOSPITAL_COMMUNITY): Payer: Self-pay | Admitting: Family Medicine

## 2021-03-09 ENCOUNTER — Ambulatory Visit (HOSPITAL_COMMUNITY)
Admission: RE | Admit: 2021-03-09 | Discharge: 2021-03-09 | Disposition: A | Discharge status: Home / Self Care | Payer: BC Managed Care – PPO | Source: Ambulatory Visit | Attending: Family Medicine | Admitting: Family Medicine

## 2021-03-09 ENCOUNTER — Other Ambulatory Visit: Payer: Self-pay

## 2021-03-09 DIAGNOSIS — J90 Pleural effusion, not elsewhere classified: Secondary | ICD-10-CM | POA: Insufficient documentation

## 2021-03-09 HISTORY — PX: IR THORACENTESIS ASP PLEURAL SPACE W/IMG GUIDE: IMG5380

## 2021-03-09 MED ORDER — LIDOCAINE HCL 1 % IJ SOLN
INTRAMUSCULAR | Status: AC
  Filled 2021-03-09: qty 20

## 2021-03-09 NOTE — Procedures (Signed)
PROCEDURE SUMMARY:  Successful image-guided right thoracentesis. Yielded 1.5 L of clear amber fluid. Pt tolerated procedure well. No immediate complications. EBL = less than 1 cc   Specimen was not sent for labs. CXR ordered.  Please see imaging section of Epic for full dictation.  Armando Gang Jaylean Buenaventura PA-C 03/09/2021 3:19 PM

## 2021-03-15 ENCOUNTER — Other Ambulatory Visit: Payer: Self-pay

## 2021-03-15 DIAGNOSIS — J189 Pneumonia, unspecified organism: Secondary | ICD-10-CM | POA: Diagnosis not present

## 2021-03-15 DIAGNOSIS — J9690 Respiratory failure, unspecified, unspecified whether with hypoxia or hypercapnia: Secondary | ICD-10-CM | POA: Diagnosis not present

## 2021-03-15 DIAGNOSIS — I509 Heart failure, unspecified: Secondary | ICD-10-CM

## 2021-03-16 ENCOUNTER — Other Ambulatory Visit: Payer: Self-pay

## 2021-03-16 ENCOUNTER — Ambulatory Visit: Payer: BC Managed Care – PPO | Admitting: Cardiology

## 2021-03-16 DIAGNOSIS — N186 End stage renal disease: Secondary | ICD-10-CM | POA: Diagnosis not present

## 2021-03-16 DIAGNOSIS — I132 Hypertensive heart and chronic kidney disease with heart failure and with stage 5 chronic kidney disease, or end stage renal disease: Secondary | ICD-10-CM | POA: Diagnosis not present

## 2021-03-16 DIAGNOSIS — I5032 Chronic diastolic (congestive) heart failure: Secondary | ICD-10-CM | POA: Diagnosis not present

## 2021-03-16 DIAGNOSIS — E1140 Type 2 diabetes mellitus with diabetic neuropathy, unspecified: Secondary | ICD-10-CM | POA: Diagnosis not present

## 2021-03-16 DIAGNOSIS — I2510 Atherosclerotic heart disease of native coronary artery without angina pectoris: Secondary | ICD-10-CM | POA: Diagnosis not present

## 2021-03-16 DIAGNOSIS — J9601 Acute respiratory failure with hypoxia: Secondary | ICD-10-CM | POA: Diagnosis not present

## 2021-03-16 DIAGNOSIS — D631 Anemia in chronic kidney disease: Secondary | ICD-10-CM | POA: Diagnosis not present

## 2021-03-16 DIAGNOSIS — I509 Heart failure, unspecified: Secondary | ICD-10-CM

## 2021-03-16 DIAGNOSIS — E1122 Type 2 diabetes mellitus with diabetic chronic kidney disease: Secondary | ICD-10-CM | POA: Diagnosis not present

## 2021-03-16 DIAGNOSIS — I252 Old myocardial infarction: Secondary | ICD-10-CM | POA: Diagnosis not present

## 2021-03-17 NOTE — Progress Notes (Signed)
Six Minute Walk   Medications taken before test (dose and time) --  Supplemental oxygen during test? No  Lap distance in meters  20 meters  Laps Completed 7  Laps Completed  --  Partial lap (in meters) --  Baseline BP (sitting) --  Baseline Heartrate 82  Baseline Dyspnea (Borg Scale) --  Baseline Fatigue (Borg Scale) --  Baseline SPO2 92 %  Interval Oxygen Saturation and HR    2 Minute Oxygen Saturation % 91 %  2 Minute HR 86  4 Minute Oxygen Saturation % 91 %  4 Minute HR 89  6 Minute Oxygen Saturation % 90 %  6 Minute HR 90  End of Test Values   BP (sitting) --  Heartrate 90  Dyspnea (Borg Scale) --  Fatigue (Borg Scale) --  SPO2 90 %  2 Minutes Post Walk Values   BP (sitting) --  Heartrate --  SPO2 --  Stopped or paused before six minutes? Yes  Reason: had leg pain  Other Symptoms at end of exercise: Hip pain; Leg pain

## 2021-03-21 DIAGNOSIS — D631 Anemia in chronic kidney disease: Secondary | ICD-10-CM | POA: Diagnosis not present

## 2021-03-21 DIAGNOSIS — J9601 Acute respiratory failure with hypoxia: Secondary | ICD-10-CM | POA: Diagnosis not present

## 2021-03-21 DIAGNOSIS — N186 End stage renal disease: Secondary | ICD-10-CM | POA: Diagnosis not present

## 2021-03-21 DIAGNOSIS — I5032 Chronic diastolic (congestive) heart failure: Secondary | ICD-10-CM | POA: Diagnosis not present

## 2021-03-21 DIAGNOSIS — E1140 Type 2 diabetes mellitus with diabetic neuropathy, unspecified: Secondary | ICD-10-CM | POA: Diagnosis not present

## 2021-03-21 DIAGNOSIS — E1122 Type 2 diabetes mellitus with diabetic chronic kidney disease: Secondary | ICD-10-CM | POA: Diagnosis not present

## 2021-03-21 DIAGNOSIS — I132 Hypertensive heart and chronic kidney disease with heart failure and with stage 5 chronic kidney disease, or end stage renal disease: Secondary | ICD-10-CM | POA: Diagnosis not present

## 2021-03-21 DIAGNOSIS — I252 Old myocardial infarction: Secondary | ICD-10-CM | POA: Diagnosis not present

## 2021-03-21 DIAGNOSIS — I2510 Atherosclerotic heart disease of native coronary artery without angina pectoris: Secondary | ICD-10-CM | POA: Diagnosis not present

## 2021-04-01 DIAGNOSIS — I4819 Other persistent atrial fibrillation: Secondary | ICD-10-CM | POA: Diagnosis not present

## 2021-04-15 DIAGNOSIS — J9690 Respiratory failure, unspecified, unspecified whether with hypoxia or hypercapnia: Secondary | ICD-10-CM | POA: Diagnosis not present

## 2021-04-15 DIAGNOSIS — J189 Pneumonia, unspecified organism: Secondary | ICD-10-CM | POA: Diagnosis not present

## 2021-04-18 ENCOUNTER — Encounter (INDEPENDENT_AMBULATORY_CARE_PROVIDER_SITE_OTHER): Payer: BC Managed Care – PPO | Admitting: Ophthalmology

## 2021-05-01 DIAGNOSIS — I4819 Other persistent atrial fibrillation: Secondary | ICD-10-CM | POA: Diagnosis not present

## 2021-05-04 ENCOUNTER — Ambulatory Visit: Payer: BC Managed Care – PPO | Admitting: Endocrinology

## 2021-05-15 DIAGNOSIS — J9690 Respiratory failure, unspecified, unspecified whether with hypoxia or hypercapnia: Secondary | ICD-10-CM | POA: Diagnosis not present

## 2021-05-15 DIAGNOSIS — J189 Pneumonia, unspecified organism: Secondary | ICD-10-CM | POA: Diagnosis not present

## 2021-06-01 DIAGNOSIS — I4819 Other persistent atrial fibrillation: Secondary | ICD-10-CM | POA: Diagnosis not present

## 2021-06-02 ENCOUNTER — Ambulatory Visit: Payer: BC Managed Care – PPO | Admitting: Cardiology

## 2021-06-02 NOTE — Progress Notes (Deleted)
Patient is here for follow up visit.  Subjective:   William Webb [facility], male    DOB: June 15, 1951, 70 y.o.   MRN: [id number]   No chief complaint on file.  HPI  70 y.o. African American male with hypertension, hyperlipidemia, type 2 DM, CAD, PAD, ESRD no won HD, h/o cardioversion for persistent Afib, fall (08/2020), small pericardial effusion, pulmonary hypertension  Patient was recently hospitalized with hypoxia, treated for pneumonia. During this hospitalization, his renal function was also noted to further deteriorate from baseline CKD IV/V. He already had a LUE AVF. He was started on hemodialysis, tolerating well. Blood pressure control improving. He was hypoxic on discharge with recent pneumonia, underlying pulmonary hypertension. He has a portable oxygen cylinder, but is not working well. He is going to call help line number. O2 sats currently 90% on room air.   Patient was here for routine office visit today.  He has had subjective fever and chills, along with shortness of breath for last 2 3 days.  He has also had increasing orthopnea.  He was very dizzy and unstable on his feet in the office this morning.  He had left sided burning sensation on lower chest/upper abdomen for 2 hours, which has since resolved.     Current Outpatient Medications on File Prior to Visit  Medication Sig Dispense Refill   acetaminophen (TYLENOL) 650 MG CR tablet Take 1,300 mg by mouth every 8 (eight) hours as needed for pain.     Alcohol Swabs (B-D SINGLE USE SWABS REGULAR) PADS      Blood Glucose Monitoring Suppl (ACCU-CHEK GUIDE ME) w/Device KIT 1 Device by Does not apply route 4 (four) times daily -  before meals and at bedtime. 1 kit 0   calcitRIOL (ROCALTROL) 0.25 MCG capsule Take 0.25 mcg by mouth every Monday, Wednesday, and Friday.     carvedilol (COREG) 6.25 MG tablet Take 1 tablet (6.25 mg total) by mouth 2 (two) times daily. 60 tablet 3   Cholecalciferol (VITAMIN D-3) 125 MCG (5000 UT)  TABS Take 5,000 mcg by mouth daily.     Continuous Blood Gluc Sensor (DEXCOM G6 SENSOR) MISC 1 Device by Does not apply route See admin instructions. Change every 10 days 9 each 3   Continuous Blood Gluc Sensor (DEXCOM G6 SENSOR) MISC 1 Device by Does not apply route See admin instructions. Change every 10 days 9 each 3   Cyanocobalamin 2500 MCG TABS Take 5,000 mcg by mouth daily. Vitamin b12     Dextromethorphan-guaiFENesin (CORICIDIN HBP CONGESTION/COUGH) 10-200 MG CAPS Take 2 capsules by mouth daily as needed (cough/congestion).     ELIQUIS 5 MG TABS tablet TAKE 1 TABLET BY MOUTH TWICE A DAY (Patient taking differently: Take 5 mg by mouth 2 (two) times daily.) 60 tablet 3   furosemide (LASIX) 80 MG tablet Take 80 mg by mouth See admin instructions. Take 80 mg by mouth twice a day and additional 80 mg before bedtime as needed for swelling of the ankles   Patient states taking 4m 3x/day     gabapentin (NEURONTIN) 600 MG tablet Take 0.5 tablets (300 mg total) by mouth 2 (two) times daily. (Patient taking differently: Take 600 mg by mouth 2 (two) times daily.) 60 tablet 0   glucose 4 GM chewable tablet Chew 1 tablet by mouth as needed for low blood sugar.     hydrALAZINE (APRESOLINE) 100 MG tablet Take 100 mg by mouth 3 (three) times daily.     isosorbide  mononitrate (IMDUR) 60 MG 24 hr tablet Take 1 tablet (60 mg total) by mouth daily. 30 tablet 0   ketorolac (ACULAR) 0.5 % ophthalmic solution Place 1 drop into both eyes daily as needed (inflamation).     linaclotide (LINZESS) 145 MCG CAPS capsule Take 1 capsule (145 mcg total) by mouth daily as needed (constipation). 30 capsule 0   methocarbamol (ROBAXIN) 500 MG tablet Take 500 mg by mouth daily.     montelukast (SINGULAIR) 10 MG tablet TAKE 1 TABLET BY MOUTH EVERYDAY AT BEDTIME 90 tablet 0   NOVOLOG FLEXPEN 100 UNIT/ML FlexPen INJECT 3 UNITS INTO THE SKIN 3 (THREE) TIMES DAILY WITH MEALS. (Patient taking differently: Inject 3-5 Units into the  skin 3 (three) times daily with meals. As needed) 15 mL 11   ofloxacin (OCUFLOX) 0.3 % ophthalmic solution Place 1 drop into both eyes daily.     omeprazole (PRILOSEC) 20 MG capsule Take 20 mg by mouth daily.     ondansetron (ZOFRAN) 4 MG tablet Take 1 tablet (4 mg total) by mouth every 8 (eight) hours as needed for nausea. 20 tablet 0   prednisoLONE acetate (PRED FORTE) 1 % ophthalmic suspension Place 1 drop into both eyes daily as needed (inflammation).     rosuvastatin (CRESTOR) 20 MG tablet TAKE 1 TABLET (20 MG TOTAL) BY MOUTH DAILY AT 6 PM. (Patient taking differently: Take 20 mg by mouth at bedtime.) 90 tablet 1   sevelamer carbonate (RENVELA) 800 MG tablet Take 800 mg by mouth 3 (three) times daily.     sildenafil (VIAGRA) 100 MG tablet Take 1 tablet (100 mg total) by mouth daily as needed for erectile dysfunction. 10 tablet 11   tamsulosin (FLOMAX) 0.4 MG CAPS capsule Take 0.4 mg by mouth at bedtime.     Tetrahydroz-Dextran-PEG-Povid (VISINE ADVANCED RELIEF) 0.05-0.1-1-1 % SOLN Place 1 drop into both eyes 3 (three) times daily as needed. Taking as needed per pt     vitamin E (VITAMIN E) 1000 UNIT capsule Take 1,000 Units by mouth daily.      No current facility-administered medications on file prior to visit.    Cardiovascular studies:  EKG 02/01/2021: Sinus rhythm 91 bpm Normal EKG  Mobile cardiac telemetry 13 days 09/19/2020 - 10/03/2020: Dominant rhythm: Sinus. HR 58-95 bpm. Avg HR 71 bpm, while in sinus rhythm. 33 episodes of SVT, fastest at 132 bpm for 5 beats, longest for 20 beats at 113 bpm. 4,4% isolated SVE, <1% couplet/triplets. 11 episodes of NSVT, fastest at 222 bpm for 7 beats, longest for 19 beats at 145 bpm. <1% isolated VE, couplet/triplets. No atrial fibrillation/atrial flutter//high grade AV block, sinus pause >3sec noted. 0 patient triggered events.      Lower Extremity Arterial Duplex 01/18/2020:  No hemodynamically significant stenosis noted in the  bilateral common and  superficial femoral arteries.  Bilateral anterior and post tibial spectral waveform demonstrates a  monophasic flow pattern suggestive of severe diffuse small vessel disease.  This exam reveals normal perfusion of the right and left lower extremity  (ABI 0.97).    The ABI may be falsely elevated in a patient with arteriosclerosis.  Consider further work up for CLI if clinically indicated.  EKG 01/08/2020: Sinus rhythm 66 bpm. Left ventricular hypertrophy. ST-T changes related to LVH.  Coronary angiogram 10/20/2018: LM: Normal LAD: Distal 70% diffuse disease.        Ostial 90% stenosis in Diag1. Patent prox Diag 1 stent LCx: Large OM1 with patent prior stent RCA: 100%  occluded RPDA        Partially successful PTCA attempt        100%--99% stenosis        TIMI flow 0-I  LVEDP 16 mmHg  Echocardiogram 10/17/2018: Left ventricle: The cavity size was normal. Wall thickness was increased in a pattern of severe LVH. Systolic function was normal. The estimated ejection fraction was in the range of 50% to 55%. Wall motion was normal; there were no regional wall motion abnormalities. Doppler parameters are consistent with anormal left ventricular relaxation (grade 1 diastolic dysfunction). Doppler parameters are consistent with high ventricular filling pressure. - Left atrium: The atrium was moderately dilated. - Right atrium: The atrium was mildly dilated.   Impressions: - Low normal to mildly reduced LV systolic function; EF 50; severe LVH; mild diastolic dysfunction; biatrial enlargement.  Recent Labs: 01/19/2021: Hb 11.1 HbA1C 6.3% TSH 2.2  10/27/2020: Hb 11.1  09/17/2020: Glucose 137, BUN/Cr 35/4.03. EGFR 15. Na/K 137/4.2.   08/02/2020: Glucose 155, BUN/Cr 35/4.1. EGFR 16. Na/K 137/3.6.  H/H 8.8/26.5. MCV 87. Platelets 177 HbA1C 7.3%   03/01/2020: Hemoglobin 11  Iron 60, TIBC 231.  01/06/2020: Glucose 173, BUN/Cr 43/2.9.  Na/K 137/4.8. Rest of  the CMP normal  12/11/2019: Glucose 151. BUN/Cr 28/2.6. HbA1C 7.7%.   11//2020: Glucose 177, BUN/Cr 27/2.72. EGFR 23. Na/K 140/4.5. Albumin 2.4 low. Rest of the CMP normal H/H 8.9/27.8. MCV 86. Platelets 177 HbA1C 8.0%   02/2018: Chol 162, TG 95, HDL 37, LDL 105  Review of Systems  Constitutional: Negative for malaise/fatigue.  Cardiovascular: Positive for chest pain (Now resolved), dyspnea on exertion and leg swelling (Improved). Negative for palpitations and syncope.       LE ulcer   Respiratory: Positive for shortness of breath.   Genitourinary:       Erectile dysfunction  All other systems reviewed and are negative.      Objective:    There were no vitals filed for this visit.     Physical Exam Vitals and nursing note reviewed.  Constitutional:      General: He is not in acute distress. Neck:     Vascular: No JVD.  Cardiovascular:     Rate and Rhythm: Normal rate. Rhythm irregularly irregular.     Pulses:          Dorsalis pedis pulses are 0 on the right side and 0 on the left side.       Posterior tibial pulses are 0 on the right side and 0 on the left side.     Heart sounds: Murmur (Flow murmur likely due to LUE AV fistula) heard.      Comments: No ulcer, gangrene Pulmonary:     Effort: Pulmonary effort is normal.     Breath sounds: Normal breath sounds. No wheezing or rales.  Musculoskeletal:     Right lower leg: No edema    Left lower leg: No edema        Assessment & Recommendations:   70 y.o. African American male with hypertension, hyperlipidemia, type 2 DM, CAD, PAD, ESRD no won HD, h/o cardioversion for persistent Afib, fall (08/2020), small pericardial effusion, pulmonary hypertension  PAD: Severe PAD without critical limb ischemia.  Continue current medical therapy, including, statin Not on Aspirin due to ongoing use of eliquis and prior h/o GI bleeding.   Atrial fibrillation: H/o persistent, now in sinus rhythm s/p cardioversion  09/29/2019. CHA2DS2VASc score 4, annual stroke risk 5%. Continue eliquis 5 mg bid.  CAD: CAD s/p prior PCI, NSTEMI in 09/2018, attempted but unsuccessful. PTCA to chronically occluded Rt PDA Stable without angina symptoms.   Hypertension: Uncontrolled, primarily related to advanced CKD. Improving since being on dialysis.  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 Has portable oxygen cylinder at home, needs to troubleshoot with the company since it is not working.   CKD: Continue follow-up with nephrology  Mild bilateral carotid stenosis: Continue medical management  F/u in 3 months  Shamecka Hocutt Esther Hardy, MD [facility] [place] Pager: [phone] Office: [phone] If no answer Cell [phone]

## 2021-06-15 DIAGNOSIS — J9690 Respiratory failure, unspecified, unspecified whether with hypoxia or hypercapnia: Secondary | ICD-10-CM | POA: Diagnosis not present

## 2021-06-15 DIAGNOSIS — J189 Pneumonia, unspecified organism: Secondary | ICD-10-CM | POA: Diagnosis not present

## 2021-07-31 ENCOUNTER — Other Ambulatory Visit: Payer: Self-pay | Admitting: Nurse Practitioner

## 2021-07-31 DIAGNOSIS — N1330 Unspecified hydronephrosis: Secondary | ICD-10-CM

## 2021-08-02 NOTE — Progress Notes (Signed)
No show

## 2021-08-03 ENCOUNTER — Ambulatory Visit: Payer: BC Managed Care – PPO | Admitting: Cardiology

## 2021-08-10 ENCOUNTER — Ambulatory Visit
Admission: RE | Admit: 2021-08-10 | Discharge: 2021-08-10 | Disposition: A | Discharge status: Home / Self Care | Payer: Medicare HMO | Source: Ambulatory Visit | Attending: Nurse Practitioner | Admitting: Nurse Practitioner

## 2021-08-10 DIAGNOSIS — N1330 Unspecified hydronephrosis: Secondary | ICD-10-CM

## 2021-08-10 DIAGNOSIS — N281 Cyst of kidney, acquired: Secondary | ICD-10-CM | POA: Diagnosis not present

## 2021-10-19 ENCOUNTER — Encounter: Payer: Self-pay | Admitting: Cardiology

## 2021-10-19 ENCOUNTER — Other Ambulatory Visit: Payer: Self-pay

## 2021-10-19 ENCOUNTER — Ambulatory Visit: Payer: BC Managed Care – PPO | Admitting: Cardiology

## 2021-10-19 VITALS — BP 183/91 | HR 68 | Temp 97.7°F | Resp 16 | Ht 75.0 in | Wt 191.0 lb

## 2021-10-19 DIAGNOSIS — I739 Peripheral vascular disease, unspecified: Secondary | ICD-10-CM

## 2021-10-19 DIAGNOSIS — I2510 Atherosclerotic heart disease of native coronary artery without angina pectoris: Secondary | ICD-10-CM

## 2021-10-19 DIAGNOSIS — N186 End stage renal disease: Secondary | ICD-10-CM | POA: Insufficient documentation

## 2021-10-19 DIAGNOSIS — I10 Essential (primary) hypertension: Secondary | ICD-10-CM

## 2021-10-19 DIAGNOSIS — Z992 Dependence on renal dialysis: Secondary | ICD-10-CM

## 2021-10-19 NOTE — Progress Notes (Signed)
Patient is here for follow up visit.  Subjective:   William Webb [facility], male    DOB: 1951-06-16, 70 y.o.   MRN: [id number]   Chief Complaint  Patient presents with   Congestive Heart Failure   Follow-up    3 month   HPI  70 y.o. African American male with hypertension, hyperlipidemia, type 2 DM, CAD, PAD, ESRD now on HD, h/o cardioversion for persistent Afib, fall (08/2020), small pericardial effusion, pulmonary hypertension  Patient states that he is "feeling great".  Dialysis is going well.  Diabetes is now under better control.  His blood pressure is usually "too low".  It is elevated today, but reportedly normal on all his dialysis sessions.   Current Outpatient Medications on File Prior to Visit  Medication Sig Dispense Refill   acetaminophen (TYLENOL) 650 MG CR tablet Take 1,300 mg by mouth every 8 (eight) hours as needed for pain.     Alcohol Swabs (B-D SINGLE USE SWABS REGULAR) PADS      Blood Glucose Monitoring Suppl (ACCU-CHEK GUIDE ME) w/Device KIT 1 Device by Does not apply route 4 (four) times daily -  before meals and at bedtime. 1 kit 0   calcitRIOL (ROCALTROL) 0.25 MCG capsule Take 0.25 mcg by mouth every Monday, Wednesday, and Friday.     carvedilol (COREG) 6.25 MG tablet Take 1 tablet (6.25 mg total) by mouth 2 (two) times daily. 60 tablet 3   Cholecalciferol (VITAMIN D-3) 125 MCG (5000 UT) TABS Take 5,000 mcg by mouth daily.     Continuous Blood Gluc Sensor (DEXCOM G6 SENSOR) MISC 1 Device by Does not apply route See admin instructions. Change every 10 days 9 each 3   Continuous Blood Gluc Sensor (DEXCOM G6 SENSOR) MISC 1 Device by Does not apply route See admin instructions. Change every 10 days 9 each 3   Cyanocobalamin 2500 MCG TABS Take 5,000 mcg by mouth daily. Vitamin b12     Dextromethorphan-guaiFENesin (CORICIDIN HBP CONGESTION/COUGH) 10-200 MG CAPS Take 2 capsules by mouth daily as needed (cough/congestion).     ELIQUIS 5 MG TABS tablet TAKE 1  TABLET BY MOUTH TWICE A DAY (Patient taking differently: Take 5 mg by mouth 2 (two) times daily.) 60 tablet 3   furosemide (LASIX) 80 MG tablet Take 80 mg by mouth See admin instructions. Take 80 mg by mouth twice a day and additional 80 mg before bedtime as needed for swelling of the ankles   Patient states taking 42m 3x/day     gabapentin (NEURONTIN) 600 MG tablet Take 0.5 tablets (300 mg total) by mouth 2 (two) times daily. (Patient taking differently: Take 600 mg by mouth 2 (two) times daily.) 60 tablet 0   glucose 4 GM chewable tablet Chew 1 tablet by mouth as needed for low blood sugar.     hydrALAZINE (APRESOLINE) 100 MG tablet Take 100 mg by mouth 3 (three) times daily.     isosorbide mononitrate (IMDUR) 60 MG 24 hr tablet Take 1 tablet (60 mg total) by mouth daily. 30 tablet 0   ketorolac (ACULAR) 0.5 % ophthalmic solution Place 1 drop into both eyes daily as needed (inflamation).     linaclotide (LINZESS) 145 MCG CAPS capsule Take 1 capsule (145 mcg total) by mouth daily as needed (constipation). 30 capsule 0   methocarbamol (ROBAXIN) 500 MG tablet Take 500 mg by mouth daily.     montelukast (SINGULAIR) 10 MG tablet TAKE 1 TABLET BY MOUTH EVERYDAY AT BEDTIME 90 tablet  0   NOVOLOG FLEXPEN 100 UNIT/ML FlexPen INJECT 3 UNITS INTO THE SKIN 3 (THREE) TIMES DAILY WITH MEALS. (Patient taking differently: Inject 3-5 Units into the skin 3 (three) times daily with meals. As needed) 15 mL 11   ofloxacin (OCUFLOX) 0.3 % ophthalmic solution Place 1 drop into both eyes daily.     omeprazole (PRILOSEC) 20 MG capsule Take 20 mg by mouth daily.     ondansetron (ZOFRAN) 4 MG tablet Take 1 tablet (4 mg total) by mouth every 8 (eight) hours as needed for nausea. 20 tablet 0   prednisoLONE acetate (PRED FORTE) 1 % ophthalmic suspension Place 1 drop into both eyes daily as needed (inflammation).     rosuvastatin (CRESTOR) 20 MG tablet TAKE 1 TABLET (20 MG TOTAL) BY MOUTH DAILY AT 6 PM. (Patient taking  differently: Take 20 mg by mouth at bedtime.) 90 tablet 1   sevelamer carbonate (RENVELA) 800 MG tablet Take 800 mg by mouth 3 (three) times daily.     sildenafil (VIAGRA) 100 MG tablet Take 1 tablet (100 mg total) by mouth daily as needed for erectile dysfunction. 10 tablet 11   tamsulosin (FLOMAX) 0.4 MG CAPS capsule Take 0.4 mg by mouth at bedtime.     Tetrahydroz-Dextran-PEG-Povid (VISINE ADVANCED RELIEF) 0.05-0.1-1-1 % SOLN Place 1 drop into both eyes 3 (three) times daily as needed. Taking as needed per pt     vitamin E (VITAMIN E) 1000 UNIT capsule Take 1,000 Units by mouth daily.      No current facility-administered medications on file prior to visit.    Cardiovascular studies:  EKG 02/01/2021: Sinus rhythm 91 bpm Normal EKG  Mobile cardiac telemetry 13 days 09/19/2020 - 10/03/2020: Dominant rhythm: Sinus. HR 58-95 bpm. Avg HR 71 bpm, while in sinus rhythm. 33 episodes of SVT, fastest at 132 bpm for 5 beats, longest for 20 beats at 113 bpm. 4,4% isolated SVE, <1% couplet/triplets. 11 episodes of NSVT, fastest at 222 bpm for 7 beats, longest for 19 beats at 145 bpm. <1% isolated VE, couplet/triplets. No atrial fibrillation/atrial flutter//high grade AV block, sinus pause >3sec noted. 0 patient triggered events.      Lower Extremity Arterial Duplex 01/18/2020:  No hemodynamically significant stenosis noted in the bilateral common and  superficial femoral arteries.  Bilateral anterior and post tibial spectral waveform demonstrates a  monophasic flow pattern suggestive of severe diffuse small vessel disease.  This exam reveals normal perfusion of the right and left lower extremity  (ABI 0.97).    The ABI may be falsely elevated in a patient with arteriosclerosis.  Consider further work up for CLI if clinically indicated.  EKG 01/08/2020: Sinus rhythm 66 bpm. Left ventricular hypertrophy. ST-T changes related to LVH.  Coronary angiogram 10/20/2018: LM: Normal LAD: Distal  70% diffuse disease.        Ostial 90% stenosis in Diag1. Patent prox Diag 1 stent LCx: Large OM1 with patent prior stent RCA: 100% occluded RPDA        Partially successful PTCA attempt        100%--99% stenosis        TIMI flow 0-I  LVEDP 16 mmHg  Echocardiogram 10/17/2018: Left ventricle: The cavity size was normal. Wall thickness was increased in a pattern of severe LVH. Systolic function was normal. The estimated ejection fraction was in the range of 50% to 55%. Wall motion was normal; there were no regional wall motion abnormalities. Doppler parameters are consistent with anormal left ventricular relaxation (grade 1  diastolic dysfunction). Doppler parameters are consistent with high ventricular filling pressure. - Left atrium: The atrium was moderately dilated. - Right atrium: The atrium was mildly dilated.   Impressions: - Low normal to mildly reduced LV systolic function; EF 50; severe LVH; mild diastolic dysfunction; biatrial enlargement.  Recent Labs: 01/19/2021: Hb 11.1 HbA1C 6.3% TSH 2.2  10/27/2020: Hb 11.1  09/17/2020: Glucose 137, BUN/Cr 35/4.03. EGFR 15. Na/K 137/4.2.   08/02/2020: Glucose 155, BUN/Cr 35/4.1. EGFR 16. Na/K 137/3.6.  H/H 8.8/26.5. MCV 87. Platelets 177 HbA1C 7.3%   03/01/2020: Hemoglobin 11  Iron 60, TIBC 231.  01/06/2020: Glucose 173, BUN/Cr 43/2.9.  Na/K 137/4.8. Rest of the CMP normal  12/11/2019: Glucose 151. BUN/Cr 28/2.6. HbA1C 7.7%.   11//2020: Glucose 177, BUN/Cr 27/2.72. EGFR 23. Na/K 140/4.5. Albumin 2.4 low. Rest of the CMP normal H/H 8.9/27.8. MCV 86. Platelets 177 HbA1C 8.0%   02/2018: Chol 162, TG 95, HDL 37, LDL 105  Review of Systems  Constitutional: Negative for malaise/fatigue.  Cardiovascular: Positive for chest pain (Now resolved), dyspnea on exertion and leg swelling (Improved). Negative for palpitations and syncope.       LE ulcer   Respiratory: Positive for shortness of breath.   Genitourinary:        Erectile dysfunction  All other systems reviewed and are negative.      Objective:    Vitals:   10/19/21 1112 10/19/21 1120  BP: (!) 160/60 (!) 183/91  Pulse: 67 68  Resp: 16   Temp: 97.7 F (36.5 C)   SpO2: 96%        Physical Exam Vitals and nursing note reviewed.  Constitutional:      General: He is not in acute distress. Neck:     Vascular: No JVD.  Cardiovascular:     Rate and Rhythm: Normal rate. Rhythm irregularly irregular.     Pulses:          Dorsalis pedis pulses are 0 on the right side and 0 on the left side.       Posterior tibial pulses are 0 on the right side and 0 on the left side.     Heart sounds: Murmur (Flow murmur likely due to LUE AV fistula) heard.      Comments: No ulcer, gangrene Pulmonary:     Effort: Pulmonary effort is normal.     Breath sounds: Normal breath sounds. No wheezing or rales.  Musculoskeletal:     Right lower leg: No edema    Left lower leg: No edema        Assessment & Recommendations:   70 y.o. African American male with hypertension, hyperlipidemia, type 2 DM, CAD, PAD, ESRD now On HD, h/o cardioversion for persistent Afib, fall (08/2020), small pericardial effusion, pulmonary hypertension  PAD: Severe PAD without critical limb ischemia.  Continue current medical therapy, including, statin Not on Aspirin due to ongoing use of eliquis and prior h/o GI bleeding.   Atrial fibrillation: H/o persistent, now in sinus rhythm s/p cardioversion 09/29/2019. CHA2DS2VASc score 4, annual stroke risk 5%. Continue eliquis 5 mg bid.   CAD: CAD s/p prior PCI, NSTEMI in 09/2018, attempted but unsuccessful. PTCA to chronically occluded Rt PDA Stable without angina symptoms.   Hypertension: Blood pressure elevated today, but reportedly normal to low at his dialysis sessions.  No changes made today.  Continue follow-up with PCP and nephrology.    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 Has portable oxygen cylinder at home,  needs to troubleshoot with  the company since it is not working.   CKD: Continue follow-up with nephrology  Mild bilateral carotid stenosis: Continue medical management  F/u in 6 months  Randie Tallarico Esther Hardy, MD [facility] [place] Pager: [phone] Office: [phone] If no answer Cell [phone]

## 2021-10-30 DIAGNOSIS — N2581 Secondary hyperparathyroidism of renal origin: Secondary | ICD-10-CM | POA: Diagnosis not present

## 2021-10-30 DIAGNOSIS — N186 End stage renal disease: Secondary | ICD-10-CM | POA: Diagnosis not present

## 2021-10-30 DIAGNOSIS — Z992 Dependence on renal dialysis: Secondary | ICD-10-CM | POA: Diagnosis not present

## 2021-11-01 DIAGNOSIS — N2581 Secondary hyperparathyroidism of renal origin: Secondary | ICD-10-CM | POA: Diagnosis not present

## 2021-11-01 DIAGNOSIS — Z992 Dependence on renal dialysis: Secondary | ICD-10-CM | POA: Diagnosis not present

## 2021-11-01 DIAGNOSIS — I4819 Other persistent atrial fibrillation: Secondary | ICD-10-CM | POA: Diagnosis not present

## 2021-11-01 DIAGNOSIS — N186 End stage renal disease: Secondary | ICD-10-CM | POA: Diagnosis not present

## 2021-11-03 DIAGNOSIS — N186 End stage renal disease: Secondary | ICD-10-CM | POA: Diagnosis not present

## 2021-11-03 DIAGNOSIS — N2581 Secondary hyperparathyroidism of renal origin: Secondary | ICD-10-CM | POA: Diagnosis not present

## 2021-11-03 DIAGNOSIS — Z992 Dependence on renal dialysis: Secondary | ICD-10-CM | POA: Diagnosis not present

## 2021-11-06 DIAGNOSIS — N2581 Secondary hyperparathyroidism of renal origin: Secondary | ICD-10-CM | POA: Diagnosis not present

## 2021-11-06 DIAGNOSIS — Z992 Dependence on renal dialysis: Secondary | ICD-10-CM | POA: Diagnosis not present

## 2021-11-06 DIAGNOSIS — N186 End stage renal disease: Secondary | ICD-10-CM | POA: Diagnosis not present

## 2021-11-08 DIAGNOSIS — N2581 Secondary hyperparathyroidism of renal origin: Secondary | ICD-10-CM | POA: Diagnosis not present

## 2021-11-08 DIAGNOSIS — Z992 Dependence on renal dialysis: Secondary | ICD-10-CM | POA: Diagnosis not present

## 2021-11-08 DIAGNOSIS — N186 End stage renal disease: Secondary | ICD-10-CM | POA: Diagnosis not present

## 2021-11-09 DIAGNOSIS — C678 Malignant neoplasm of overlapping sites of bladder: Secondary | ICD-10-CM | POA: Diagnosis not present

## 2021-11-10 DIAGNOSIS — Z992 Dependence on renal dialysis: Secondary | ICD-10-CM | POA: Diagnosis not present

## 2021-11-10 DIAGNOSIS — N186 End stage renal disease: Secondary | ICD-10-CM | POA: Diagnosis not present

## 2021-11-10 DIAGNOSIS — N2581 Secondary hyperparathyroidism of renal origin: Secondary | ICD-10-CM | POA: Diagnosis not present

## 2021-11-13 DIAGNOSIS — N2581 Secondary hyperparathyroidism of renal origin: Secondary | ICD-10-CM | POA: Diagnosis not present

## 2021-11-13 DIAGNOSIS — Z992 Dependence on renal dialysis: Secondary | ICD-10-CM | POA: Diagnosis not present

## 2021-11-13 DIAGNOSIS — N186 End stage renal disease: Secondary | ICD-10-CM | POA: Diagnosis not present

## 2021-11-15 DIAGNOSIS — N186 End stage renal disease: Secondary | ICD-10-CM | POA: Diagnosis not present

## 2021-11-15 DIAGNOSIS — Z992 Dependence on renal dialysis: Secondary | ICD-10-CM | POA: Diagnosis not present

## 2021-11-15 DIAGNOSIS — N2581 Secondary hyperparathyroidism of renal origin: Secondary | ICD-10-CM | POA: Diagnosis not present

## 2021-11-17 DIAGNOSIS — N2581 Secondary hyperparathyroidism of renal origin: Secondary | ICD-10-CM | POA: Diagnosis not present

## 2021-11-17 DIAGNOSIS — Z992 Dependence on renal dialysis: Secondary | ICD-10-CM | POA: Diagnosis not present

## 2021-11-17 DIAGNOSIS — N186 End stage renal disease: Secondary | ICD-10-CM | POA: Diagnosis not present

## 2021-11-20 DIAGNOSIS — N2581 Secondary hyperparathyroidism of renal origin: Secondary | ICD-10-CM | POA: Diagnosis not present

## 2021-11-20 DIAGNOSIS — N186 End stage renal disease: Secondary | ICD-10-CM | POA: Diagnosis not present

## 2021-11-20 DIAGNOSIS — Z992 Dependence on renal dialysis: Secondary | ICD-10-CM | POA: Diagnosis not present

## 2021-11-22 DIAGNOSIS — N2581 Secondary hyperparathyroidism of renal origin: Secondary | ICD-10-CM | POA: Diagnosis not present

## 2021-11-22 DIAGNOSIS — Z992 Dependence on renal dialysis: Secondary | ICD-10-CM | POA: Diagnosis not present

## 2021-11-22 DIAGNOSIS — N186 End stage renal disease: Secondary | ICD-10-CM | POA: Diagnosis not present

## 2021-11-24 DIAGNOSIS — N186 End stage renal disease: Secondary | ICD-10-CM | POA: Diagnosis not present

## 2021-11-24 DIAGNOSIS — Z992 Dependence on renal dialysis: Secondary | ICD-10-CM | POA: Diagnosis not present

## 2021-11-24 DIAGNOSIS — N2581 Secondary hyperparathyroidism of renal origin: Secondary | ICD-10-CM | POA: Diagnosis not present

## 2021-11-27 DIAGNOSIS — N2581 Secondary hyperparathyroidism of renal origin: Secondary | ICD-10-CM | POA: Diagnosis not present

## 2021-11-27 DIAGNOSIS — N186 End stage renal disease: Secondary | ICD-10-CM | POA: Diagnosis not present

## 2021-11-27 DIAGNOSIS — Z992 Dependence on renal dialysis: Secondary | ICD-10-CM | POA: Diagnosis not present

## 2021-11-28 ENCOUNTER — Other Ambulatory Visit: Payer: Self-pay | Admitting: Urology

## 2021-11-28 ENCOUNTER — Encounter (HOSPITAL_COMMUNITY): Payer: Self-pay

## 2021-11-28 DIAGNOSIS — N186 End stage renal disease: Secondary | ICD-10-CM | POA: Diagnosis not present

## 2021-11-28 DIAGNOSIS — Z992 Dependence on renal dialysis: Secondary | ICD-10-CM | POA: Diagnosis not present

## 2021-11-28 DIAGNOSIS — E1129 Type 2 diabetes mellitus with other diabetic kidney complication: Secondary | ICD-10-CM | POA: Diagnosis not present

## 2021-11-29 DIAGNOSIS — Z992 Dependence on renal dialysis: Secondary | ICD-10-CM | POA: Diagnosis not present

## 2021-11-29 DIAGNOSIS — N186 End stage renal disease: Secondary | ICD-10-CM | POA: Diagnosis not present

## 2021-11-29 DIAGNOSIS — N2581 Secondary hyperparathyroidism of renal origin: Secondary | ICD-10-CM | POA: Diagnosis not present

## 2021-11-29 NOTE — Patient Instructions (Addendum)
DUE TO COVID-19 ONLY ONE VISITOR IS ALLOWED TO COME WITH YOU AND STAY IN THE WAITING ROOM ONLY DURING PRE OP AND PROCEDURE.   **NO VISITORS ARE ALLOWED IN THE SHORT STAY AREA OR RECOVERY ROOM!!**   Your procedure is scheduled on: Tuesday, Feb. 7, 2023   Report to [facility] Main Entrance    Report to admitting at 10:00 AM   Call this number if you have problems the morning of surgery [phone]   Do not eat food :After Midnight.   May have liquids until 9:15 AM   day of surgery  CLEAR LIQUID DIET  Foods Allowed                                                                     Foods Excluded  Water, Black Coffee and tea, regular and decaf                             liquids that you cannot  Plain Jell-O in any flavor  (No red)                                           see through such as: Fruit ices (not with fruit pulp)                                     milk, soups, orange juice              Iced Popsicles (No red)                                    All solid food                                   Apple juices Sports drinks like Gatorade (No red) Lightly seasoned clear broth or consume(fat free) Sugar  Sample Menu Breakfast                                Lunch                                     Supper Cranberry juice                    Beef broth                            Chicken broth Jell-O                                     Grape juice  Apple juice Coffee or tea                        Jell-O                                      Popsicle                                                Coffee or tea                        Coffee or tea     FOLLOW BOWEL PREP INSTRUCTIONS YOU RECEIVED FROM YOUR SURGEON'S OFFICE!!!     Oral Hygiene is also important to reduce your risk of infection.                                    Remember - BRUSH YOUR TEETH THE MORNING OF SURGERY WITH YOUR REGULAR TOOTHPASTE   Do NOT smoke after Midnight   Take  these medicines the morning of surgery with A SIP OF WATER: Carvedilol, Gabapantin, Methocarbamol, Silodosin, Sevelamer Carbonate, Hydralazine, Isosrbide, Omeprazole   Use eyedrops per normal routine   DO NOT TAKE SILDENAFIL 24 HOURS PRIOR TO PROCEDURE!!   TAKE USUAL DOSE OF NOVOLOG THE DAY BEFORE SURGERY, DO NOT TAKE BEDTIME DOSE! (IF BLOOD SUGAR IS GREATER THAN 220 THE DAY OF PROCEDURE YOU MAY TAKE HALF 1/2 THE USUAL CORRECTION DOSE)   DO NOT TAKE ANY ORAL DIABETIC MEDICATIONS DAY OF YOUR SURGERY                              You may not have any metal on your body including jewelry, and body piercing             Do not wear lotions, powders, perfumes/cologne, or deodorant             Men may shave face and neck.   Do not bring valuables to the hospital. [place].   Contacts, dentures or bridgework may not be worn into surgery.   Bring small overnight bag day of surgery.    Patients discharged on the day of surgery will not be allowed to drive home.  Someone needs to stay with you for the first 24 hours after anesthesia.   Special Instructions: Bring a copy of your healthcare power of attorney and living will documents         the day of surgery if you haven't scanned them before.              Please read over the following fact sheets you were given: IF [place] [phone]     How to Manage Your Diabetes Before and After Surgery  Why is it important to control my blood sugar before and after surgery? Improving blood sugar levels before and after surgery helps healing and can limit problems. A way of improving blood sugar control is eating  a healthy diet by:  Eating less sugar and carbohydrates  Increasing activity/exercise  Talking with your doctor about reaching your blood sugar goals High blood sugars (greater than 180 mg/dL) can raise your risk of infections and slow  your recovery, so you will need to focus on controlling your diabetes during the weeks before surgery. Make sure that the doctor who takes care of your diabetes knows about your planned surgery including the date and location.  How do I manage my blood sugar before surgery? Check your blood sugar at least 4 times a day, starting 2 days before surgery, to make sure that the level is not too high or low. Check your blood sugar the morning of your surgery when you wake up and every 2 hours until you get to the Short Stay unit. If your blood sugar is less than 70 mg/dL, you will need to treat for low blood sugar: Do not take insulin. Treat a low blood sugar (less than 70 mg/dL) with  cup of clear juice (cranberry or apple), 4 glucose tablets, OR glucose gel. Recheck blood sugar in 15 minutes after treatment (to make sure it is greater than 70 mg/dL). If your blood sugar is not greater than 70 mg/dL on recheck, call [phone] for further instructions. Report your blood sugar to the short stay nurse when you get to Short Stay.  If you are admitted to the hospital after surgery: Your blood sugar will be checked by the staff and you will probably be given insulin after surgery (instead of oral diabetes medicines) to make sure you have good blood sugar levels. The goal for blood sugar control after surgery is 80-180 mg/dL.   WHAT DO I DO ABOUT MY DIABETES MEDICATION?  Do not take oral diabetes medicines (pills) the morning of surgery.  If your CBG is greater than 220 mg/dL, you may take  of your sliding scale  (correction) dose of insulin.    [place] - Preparing for Surgery Before surgery, you can play an important role.  Because skin is not sterile, your skin needs to be as free of germs as possible.  You can reduce the number of germs on your skin by washing with CHG (chlorahexidine gluconate) soap before surgery.  CHG is an antiseptic cleaner which kills germs and bonds with the skin to  continue killing germs even after washing. Please DO NOT use if you have an allergy to CHG or antibacterial soaps.  If your skin becomes reddened/irritated stop using the CHG and inform your nurse when you arrive at Short Stay. Do not shave (including legs and underarms) for at least 48 hours prior to the first CHG shower.  You may shave your face/neck.  Please follow these instructions carefully:  1.  Shower with CHG Soap the night before surgery and the  morning of surgery.  2.  If you choose to wash your hair, wash your hair first as usual with your normal  shampoo.  3.  After you shampoo, rinse your hair and body thoroughly to remove the shampoo.                             4.  Use CHG as you would any other liquid soap.  You can apply chg directly to the skin and wash.  Gently with a scrungie or clean washcloth.  5.  Apply the CHG Soap to your body ONLY FROM THE NECK DOWN.  Do   not use on face/ open                           Wound or open sores. Avoid contact with eyes, ears mouth and   genitals (private parts).                       Wash face,  Genitals (private parts) with your normal soap.             6.  Wash thoroughly, paying special attention to the area where your    surgery  will be performed.  7.  Thoroughly rinse your body with warm water from the neck down.  8.  DO NOT shower/wash with your normal soap after using and rinsing off the CHG Soap.                9.  Pat yourself dry with a clean towel.            10.  Wear clean pajamas.            11.  Place clean sheets on your bed the night of your first shower and do not  sleep with pets. Day of Surgery : Do not apply any lotions/deodorants the morning of surgery.  Please wear clean clothes to the hospital/surgery center.  FAILURE TO FOLLOW THESE INSTRUCTIONS MAY RESULT IN THE CANCELLATION OF YOUR SURGERY  PATIENT SIGNATURE_________________________________  NURSE  SIGNATURE__________________________________  ________________________________________________________________________

## 2021-11-29 NOTE — Progress Notes (Signed)
Sent message, via epic in basket, requesting orders in epic from surgeon.  

## 2021-11-30 ENCOUNTER — Encounter (HOSPITAL_COMMUNITY)
Admission: RE | Admit: 2021-11-30 | Discharge: 2021-11-30 | Disposition: A | Discharge status: Home / Self Care | Payer: Medicare HMO | Source: Ambulatory Visit | Attending: Urology | Admitting: Urology

## 2021-11-30 ENCOUNTER — Encounter (HOSPITAL_COMMUNITY): Payer: Self-pay

## 2021-11-30 ENCOUNTER — Other Ambulatory Visit: Payer: Self-pay

## 2021-11-30 DIAGNOSIS — Z9889 Other specified postprocedural states: Secondary | ICD-10-CM

## 2021-11-30 DIAGNOSIS — E1122 Type 2 diabetes mellitus with diabetic chronic kidney disease: Secondary | ICD-10-CM

## 2021-11-30 DIAGNOSIS — Z794 Long term (current) use of insulin: Secondary | ICD-10-CM

## 2021-11-30 NOTE — Progress Notes (Signed)
Pt. Instructed to call med tech [phone] to update medication list. Pt. VU

## 2021-11-30 NOTE — Progress Notes (Addendum)
PCP - L. Donnie Coffin  Cardiologist - M. Patwarhan 11-27-21 clearance in chart LOV 10-19-21 epic  PPM/ICD -  Device Orders -  Rep Notified -   Chest x-ray - 03-09-21 epic EKG - 02-02-21 epic Stress Test -  ECHO - 11-08-20 epic Cardiac Cath - Greater than 2 years epic  Sleep Study -  CPAP -   Fasting Blood Sugar -  Checks Blood Sugar _____ times a day  Blood Thinner Instructions:elequis Hold 2 days Aspirin Instructions:  ERAS Protcol - PRE-SURGERY Ensure or G2-   COVID TEST- N/A COVID vaccine -yes + booster x2 Pfizer  Activity-- Completes ADL's with no SOB  Anesthesia review: CAD, CHF, MI,HTN,DM,CKD111, PAF, Dialysis M,W,F, o2 PRN  Patient denies shortness of breath, fever, cough and chest pain at PAT appointment   All instructions explained to the patient, with a verbal understanding of the material. Patient agrees to go over the instructions while at home for a better understanding. Patient also instructed to self quarantine after being tested for COVID-19. The opportunity to ask questions was provided.

## 2021-12-01 DIAGNOSIS — N186 End stage renal disease: Secondary | ICD-10-CM | POA: Diagnosis not present

## 2021-12-01 DIAGNOSIS — N2581 Secondary hyperparathyroidism of renal origin: Secondary | ICD-10-CM | POA: Diagnosis not present

## 2021-12-01 DIAGNOSIS — Z992 Dependence on renal dialysis: Secondary | ICD-10-CM | POA: Diagnosis not present
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
## 2021-12-01 NOTE — Anesthesia Preprocedure Evaluation (Addendum)
Anesthesia Evaluation  Patient identified by MRN, date of birth, ID band Patient awake    Reviewed: Allergy & Precautions, NPO status , Patient's Chart, lab work & pertinent test results  Airway Mallampati: II  TM Distance: >3 FB Neck ROM: Full    Dental  (+) Missing, Poor Dentition   Pulmonary former smoker,  pHTN   Pulmonary exam normal        Cardiovascular hypertension, Pt. on medications and Pt. on home beta blockers + CAD, + Past MI, + Cardiac Stents, + Peripheral Vascular Disease (on Eliquis) and +CHF  + dysrhythmias Atrial Fibrillation  Rhythm:Regular Rate:Normal     Neuro/Psych negative neurological ROS  negative psych ROS   GI/Hepatic GERD  Medicated,  Endo/Other  diabetes, Type 2, Insulin Dependent  Renal/GU CRF and DialysisRenal disease Bladder dysfunction  Bladder Ca    Musculoskeletal  (+) Arthritis , Fibromyalgia -  Abdominal Normal abdominal exam  (+)   Peds  Hematology  (+) Blood dyscrasia, anemia ,   Anesthesia Other Findings   Reproductive/Obstetrics                           Anesthesia Physical Anesthesia Plan  ASA: 3  Anesthesia Plan: General   Post-op Pain Management:    Induction: Intravenous  PONV Risk Score and Plan: 2 and Ondansetron, Dexamethasone and Treatment may vary due to age or medical condition  Airway Management Planned: Mask and LMA  Additional Equipment: None  Intra-op Plan:   Post-operative Plan: Extubation in OR  Informed Consent: I have reviewed the patients History and Physical, chart, labs and discussed the procedure including the risks, benefits and alternatives for the proposed anesthesia with the patient or authorized representative who has indicated his/her understanding and acceptance.     Dental advisory given  Plan Discussed with: CRNA  Anesthesia Plan Comments: (See APP note by Durel Salts, FNP   Lab Results       Component                Value               Date                      WBC                      5.2                 12/05/2021                HGB                      8.3 (L)             12/05/2021                HCT                      27.1 (L)            12/05/2021                MCV                      102.3 (H)           12/05/2021  PLT                      199                 12/05/2021           Lab Results      Component                Value               Date                      NA                       138                 12/05/2021                K                        3.5                 12/05/2021                CO2                      29                  12/05/2021                GLUCOSE                  131 (H)             12/05/2021                BUN                      21                  12/05/2021                CREATININE               5.09 (H)            12/05/2021                CALCIUM                  8.1 (L)             12/05/2021                GFRNONAA                 11 (L)              12/05/2021           Echo 11/12/20:  - Left ventricle cavity is normal in size. Severe concentric hypertrophy of the left ventricle. Normal global wall motion. Normal LV systolic function with EF 54%. Indeterminate diastolic filling pattern.  - Left atrial cavity is severely dilated.  - Mild to moderate mitral regurgitation.  - Mild tricuspid regurgitation.  - Small circumferential pericardial effusion. No hemodynamic compromise.  - No evidence of pulmonary hypertension.  - Unlike previous study on 09/15/2020, pulmonary hypertension is now absent.  -  Pericardial effusion is stable  Cardiac event monitor 09/19/20:  - Dominant rhythm: Sinus. - 33 episodes of SVT, fastest at 132 bpm for 5 beats, longest for 20 beats at 113 bpm. - 11 episodes of NSVT, fastest at 222 bpm for 7 beats, longest for 19 beats at 145 bpm. - No atrial fibrillation/atrial flutter//high  grade AV block, sinus pause >3sec noted.)       Anesthesia Quick Evaluation

## 2021-12-02 DIAGNOSIS — I4819 Other persistent atrial fibrillation: Secondary | ICD-10-CM | POA: Diagnosis not present

## 2021-12-04 DIAGNOSIS — Z992 Dependence on renal dialysis: Secondary | ICD-10-CM | POA: Diagnosis not present

## 2021-12-04 DIAGNOSIS — N186 End stage renal disease: Secondary | ICD-10-CM | POA: Diagnosis not present

## 2021-12-04 DIAGNOSIS — N2581 Secondary hyperparathyroidism of renal origin: Secondary | ICD-10-CM | POA: Diagnosis not present

## 2021-12-04 NOTE — H&P (Signed)
Patient with history of BPH and end-stage renal disease. On hemodialysis 3 times a week. Patient makes very little urine perhaps a cup of urine per week by his report. He has seen some gross hematuria over the last few weeks has been throughout the stream by his report but he makes very little urine. Does have prior smoking history 1/2 pack/day for 15 years. No prior history of stones. Did have recent ultrasound which showed evidence of medical renal disease but no significant lesions otherwise. Main urinary symptom are weak stream and feelings of incomplete emptying.  Patient was unable to get a voided urine specimen today.  Post void residual today was 11 cc.  -11/09/21-patient with history of gross painless hematuria. On hemodialysis. On undergone noncontrasted CT scan showed some nonobstructing punctate bilateral renal calculi. Now here for follow-up cystoscopy to assess bladder. CT report as below  Cystoscopy is performed today shows bilobar prostatic hypertrophy with approximate 2 and half centimeter prostatic urethral length. Bladder is inspected and there is on the posterior dome region a raised papillary lesion with a couple of other smaller papillary lesion surrounding it probably 1 to 2 cm in surface area size. No other obvious lesions noted.   CLINICAL DATA: Gross hematuria   EXAM:  CT ABDOMEN AND PELVIS WITHOUT CONTRAST   TECHNIQUE:  Multidetector CT imaging of the abdomen and pelvis was performed  following the standard protocol without IV contrast.   COMPARISON: None.   FINDINGS:  Lower chest: No acute abnormality. Coronary artery calcifications.   Hepatobiliary: No solid liver abnormality is seen. Small gallstones  in the dependent gallbladder. No gallbladder wall thickening, or  biliary dilatation.   Pancreas: Unremarkable. No pancreatic ductal dilatation or  surrounding inflammatory changes.   Spleen: Normal in size without significant abnormality.   Adrenals/Urinary  Tract: Benign, fatty attenuation nodule of the  right adrenal gland. Punctuate nonobstructive calculus of the  lateral midportion of the right kidney. Additional bilateral renal  vascular calcifications without clear evidence of additional  calculi. No ureteral calculi or hydronephrosis. Fluid attenuation  cyst of the superior pole of the right kidney. Bladder is  unremarkable.   Stomach/Bowel: Stomach is within normal limits. Appendix appears  normal. No evidence of bowel wall thickening, distention, or  inflammatory changes.   Vascular/Lymphatic: Aortic atherosclerosis. No enlarged abdominal or  pelvic lymph nodes.   Reproductive: No mass or other significant abnormality.   Other: No abdominal wall hernia or abnormality. No abdominopelvic  ascites.   Musculoskeletal: No acute or significant osseous findings.   IMPRESSION:  1. Punctuate nonobstructive calculus of the lateral midportion of  the right kidney. Additional bilateral renal vascular calcifications  without clear evidence of additional calculi. No ureteral calculi or  hydronephrosis.  2. Cholelithiasis.  3. Coronary artery disease.   Aortic Atherosclerosis (ICD10-I70.0).    Electronically Signed  By: Delanna Ahmadi M.D.  On: 09/29/2021 10:16      ALLERGIES: None   MEDICATIONS: Silodosin 8 mg capsule 1 capsule PO Daily  Amlodipine Besylate 10 mg tablet 1 tablet PO Daily  Calcitriol 0.25 mcg capsule 1 capsule PO Daily  Carvedilol 25 mg tablet 1 tablet PO Daily  Eliquis 5 mg tablet 1 tablet PO Daily  Furosemide 80 mg tablet 1 tablet PO Daily  Gabapentin 600 mg tablet 1 tablet PO Daily  Hydralazine Hcl 50 mg tablet 1 tablet PO Daily  Isosorbide Mononitrate Er 60 mg tablet, extended release 24 hr 1 tablet PO Daily  Lidocaine-Prilocaine 2.5 %-2.5 %  cream 1 PO Daily  Linzess 145 mcg capsule 1 capsule PO Daily  Montelukast Sodium 10 mg tablet 1 tablet PO Daily  Novolog Flexpen 100 unit/ml (3 ml) insulin pen 1 PO  Daily  Rosuvastatin Calcium 20 mg tablet 1 tablet PO Daily  Tamsulosin Hcl 0.4 mg capsule 1 capsule PO Daily     GU PSH: No GU PSH    NON-GU PSH: Coronary Artery Bypass Grafting - 2019 Glaucoma Surgery - 2020 Knee replacement, Right     GU PMH: Gross hematuria - 09/28/2021, - 09/14/2021 BPH w/LUTS - 09/14/2021 Weak Urinary Stream - 09/14/2021    NON-GU PMH: Arthritis DVT, History Heart disease, unspecified Hypercholesterolemia Hypertension Other heart failure Stroke/TIA    FAMILY HISTORY: Anemia - Runs in Family Diabetes - Runs in Family Heart Disease - Runs in Family Kidney Cancer - Runs in Family Kidney Failure - Runs in Family pancreatic cancer - Runs in Family   SOCIAL HISTORY: Marital Status: Divorced Ethnicity: Not Hispanic Or Latino; Race: Black or African American Current Smoking Status: Patient does not smoke anymore. Has not smoked since 10/30/1983. Smoked for 20 years. Smoked 1 pack per day.   Tobacco Use Assessment Completed: Used Tobacco in last 30 days? Has never drank.  Does not use drugs. Drinks 1 caffeinated drink per day. Has had a blood transfusion.    REVIEW OF SYSTEMS:    GU Review Male:   Patient denies frequent urination, hard to postpone urination, burning/ pain with urination, get up at night to urinate, leakage of urine, stream starts and stops, trouble starting your stream, have to strain to urinate , erection problems, and penile pain.  Gastrointestinal (Upper):   Patient reports nausea. Patient denies vomiting and indigestion/ heartburn.  Gastrointestinal (Lower):   Patient reports diarrhea and constipation.   Constitutional:   Patient reports fever and fatigue. Patient denies night sweats and weight loss.  Skin:   Patient denies skin rash/ lesion and itching.  Eyes:   Patient denies blurred vision and double vision.  Ears/ Nose/ Throat:   Patient denies sore throat and sinus problems.  Hematologic/Lymphatic:   Patient denies swollen  glands and easy bruising.  Cardiovascular:   Patient denies leg swelling and chest pains.  Respiratory:   Patient denies cough and shortness of breath.  Endocrine:   Patient denies excessive thirst.  Musculoskeletal:   Patient reports back pain and joint pain.   Neurological:   Patient denies headaches and dizziness.  Psychologic:   Patient denies depression and anxiety.   VITAL SIGNS: None   GU PHYSICAL EXAMINATION:    Penis: Circumcised, no warts, no cracks. No dorsal Peyronie's plaques, no left corporal Peyronie's plaques, no right corporal Peyronie's plaques, no scarring, no warts. No balanitis, no meatal stenosis.   MULTI-SYSTEM PHYSICAL EXAMINATION:    Constitutional: Well-nourished. No physical deformities. Normally developed. Good grooming.  Neck: Neck symmetrical, not swollen. Normal tracheal position.  Respiratory: No labored breathing, no use of accessory muscles.   Cardiovascular: Normal temperature, normal extremity pulses, no swelling, no varicosities.  Lymphatic: No enlargement of neck, axillae, groin.  Skin: No paleness, no jaundice, no cyanosis. No lesion, no ulcer, no rash.  Neurologic / Psychiatric: Oriented to time, oriented to place, oriented to person. No depression, no anxiety, no agitation.  Eyes: Normal conjunctivae. Normal eyelids.  Ears, Nose, Mouth, and Throat: Left ear no scars, no lesions, no masses. Right ear no scars, no lesions, no masses. Nose no scars, no lesions, no masses. Normal hearing.  Normal lips.  Musculoskeletal: Normal gait and station of head and neck.     Complexity of Data:  Records Review:   Previous Doctor Records, Previous Patient Records  Urine Test Review:   Urinalysis  X-Ray Review: C.T. Abdomen/Pelvis: Reviewed Films. Reviewed Report. Discussed With Patient.     PROCEDURES:         Flexible Cystoscopy - 52000  Risks, benefits, and some of the potential complications of the procedure were discussed at length with the patient  including infection, bleeding, voiding discomfort, urinary retention, fever, chills, sepsis, and others. All questions were answered. Informed consent was obtained. Antibiotic prophylaxis was given. Sterile technique and intraurethral analgesia were used.  Meatus:  Normal size. Normal location. Normal condition.  Urethra:  No strictures.  External Sphincter:  Normal.  Verumontanum:  Normal.  Prostate:  Non-obstructing. No hyperplasia.  Bladder Neck:  Non-obstructing.  Ureteral Orifices:  Normal location. Normal size. Normal shape. Effluxed clear urine.  Bladder:  Cystoscopy is performed today shows bilobar prostatic hypertrophy with approximate 2 and half centimeter prostatic urethral length. Bladder is inspected and there is on the posterior dome region a raised papillary lesion with a couple of other smaller papillary lesion surrounding it probably 1 to 2 cm in surface area size. No other obvious      The lower urinary tract was carefully examined. The procedure was well-tolerated and without complications. Antibiotic instructions were given. Instructions were given to call the office immediately for bloody urine, difficulty urinating, urinary retention, painful or frequent urination, fever, chills, nausea, vomiting or other illness. The patient stated that he understood these instructions and would comply with them.   ASSESSMENT:      ICD-10 Details  1 GU:   Bladder Cancer overlapping sites - V40.9 Acute, Complicated Injury   PLAN:           Document Letter(s):  Created for Patient: Clinical Summary         Notes:   I discussed cystoscopic findings with the patient. Recommended cystoscopy TURBT and retrograde pyelogram. Patient will need cardiac clearance to come off his Eliquis.  TURBT consent: I have discussed with the patient the risks, benefits of TURBT which include but are not limited to: Bleeding, infection, damage to the bladder with potential perforation of the bladder, damage to  surrounding organs, possible need for further procedures including open repair and catheterization, possibility of nonhealing area within the bladder, urgency, frequency which may be refractory to medications. I pointed out that in some occasions after resection of the bladder tumor, mitomycin-C chemotherapy may be instilled into the bladder. The risks associated with this therapy include but are not limited to: Refractory or new onset urgency, frequency, dysuria, infrequently severe systemic side effects secondary to mitomycin-C. After full discussion of the risks, benefits and alternatives, the patient has consented to the above procedure and desires to proceed.

## 2021-12-05 ENCOUNTER — Ambulatory Visit (HOSPITAL_COMMUNITY)
Admission: RE | Admit: 2021-12-05 | Discharge: 2021-12-05 | Disposition: A | Discharge status: Home / Self Care | Payer: Medicare HMO | Source: Ambulatory Visit | Attending: Urology | Admitting: Urology

## 2021-12-05 ENCOUNTER — Ambulatory Visit (HOSPITAL_COMMUNITY): Payer: Medicare HMO | Admitting: Emergency Medicine

## 2021-12-05 ENCOUNTER — Encounter (HOSPITAL_COMMUNITY): Payer: Self-pay

## 2021-12-05 ENCOUNTER — Encounter (HOSPITAL_COMMUNITY): Payer: Self-pay | Admitting: Urology

## 2021-12-05 ENCOUNTER — Ambulatory Visit (HOSPITAL_COMMUNITY): Payer: Medicare HMO

## 2021-12-05 ENCOUNTER — Encounter (HOSPITAL_COMMUNITY)
Admission: RE | Disposition: A | Discharge status: Home / Self Care | Payer: Self-pay | Source: Ambulatory Visit | Attending: Urology

## 2021-12-05 ENCOUNTER — Emergency Department (HOSPITAL_COMMUNITY)
Admission: EM | Admit: 2021-12-05 | Discharge: 2021-12-05 | Disposition: A | Discharge status: Home / Self Care | Payer: Medicare HMO | Source: Home / Self Care | Attending: Emergency Medicine | Admitting: Emergency Medicine

## 2021-12-05 ENCOUNTER — Ambulatory Visit (HOSPITAL_COMMUNITY): Payer: Medicare HMO | Admitting: Anesthesiology

## 2021-12-05 ENCOUNTER — Other Ambulatory Visit: Payer: Self-pay

## 2021-12-05 ENCOUNTER — Emergency Department (HOSPITAL_COMMUNITY): Payer: Medicare HMO

## 2021-12-05 DIAGNOSIS — E1122 Type 2 diabetes mellitus with diabetic chronic kidney disease: Secondary | ICD-10-CM

## 2021-12-05 DIAGNOSIS — S0990XA Unspecified injury of head, initial encounter: Secondary | ICD-10-CM | POA: Insufficient documentation

## 2021-12-05 DIAGNOSIS — Z87891 Personal history of nicotine dependence: Secondary | ICD-10-CM | POA: Diagnosis not present

## 2021-12-05 DIAGNOSIS — Z7901 Long term (current) use of anticoagulants: Secondary | ICD-10-CM | POA: Insufficient documentation

## 2021-12-05 DIAGNOSIS — D090 Carcinoma in situ of bladder: Secondary | ICD-10-CM | POA: Diagnosis not present

## 2021-12-05 DIAGNOSIS — N189 Chronic kidney disease, unspecified: Secondary | ICD-10-CM | POA: Diagnosis not present

## 2021-12-05 DIAGNOSIS — Z992 Dependence on renal dialysis: Secondary | ICD-10-CM | POA: Diagnosis not present

## 2021-12-05 DIAGNOSIS — N1831 Chronic kidney disease, stage 3a: Secondary | ICD-10-CM

## 2021-12-05 DIAGNOSIS — Z794 Long term (current) use of insulin: Secondary | ICD-10-CM | POA: Diagnosis not present

## 2021-12-05 DIAGNOSIS — I2510 Atherosclerotic heart disease of native coronary artery without angina pectoris: Secondary | ICD-10-CM | POA: Diagnosis not present

## 2021-12-05 DIAGNOSIS — I132 Hypertensive heart and chronic kidney disease with heart failure and with stage 5 chronic kidney disease, or end stage renal disease: Secondary | ICD-10-CM | POA: Diagnosis not present

## 2021-12-05 DIAGNOSIS — N400 Enlarged prostate without lower urinary tract symptoms: Secondary | ICD-10-CM | POA: Diagnosis not present

## 2021-12-05 DIAGNOSIS — N186 End stage renal disease: Secondary | ICD-10-CM

## 2021-12-05 DIAGNOSIS — Z79899 Other long term (current) drug therapy: Secondary | ICD-10-CM | POA: Insufficient documentation

## 2021-12-05 DIAGNOSIS — W19XXXA Unspecified fall, initial encounter: Secondary | ICD-10-CM | POA: Diagnosis not present

## 2021-12-05 DIAGNOSIS — I959 Hypotension, unspecified: Secondary | ICD-10-CM | POA: Diagnosis not present

## 2021-12-05 DIAGNOSIS — W01198A Fall on same level from slipping, tripping and stumbling with subsequent striking against other object, initial encounter: Secondary | ICD-10-CM | POA: Insufficient documentation

## 2021-12-05 DIAGNOSIS — C679 Malignant neoplasm of bladder, unspecified: Secondary | ICD-10-CM | POA: Insufficient documentation

## 2021-12-05 DIAGNOSIS — R310 Gross hematuria: Secondary | ICD-10-CM | POA: Insufficient documentation

## 2021-12-05 DIAGNOSIS — D638 Anemia in other chronic diseases classified elsewhere: Secondary | ICD-10-CM

## 2021-12-05 DIAGNOSIS — C678 Malignant neoplasm of overlapping sites of bladder: Secondary | ICD-10-CM | POA: Diagnosis not present

## 2021-12-05 DIAGNOSIS — I4819 Other persistent atrial fibrillation: Secondary | ICD-10-CM

## 2021-12-05 DIAGNOSIS — I120 Hypertensive chronic kidney disease with stage 5 chronic kidney disease or end stage renal disease: Secondary | ICD-10-CM | POA: Diagnosis not present

## 2021-12-05 DIAGNOSIS — I509 Heart failure, unspecified: Secondary | ICD-10-CM | POA: Diagnosis not present

## 2021-12-05 HISTORY — PX: TRANSURETHRAL RESECTION OF BLADDER TUMOR: SHX2575

## 2021-12-05 HISTORY — PX: CYSTOSCOPY W/ RETROGRADES: SHX1426

## 2021-12-05 LAB — CBC
HCT: 27.1 % — ABNORMAL LOW (ref 39.0–52.0)
Hemoglobin: 8.3 g/dL — ABNORMAL LOW (ref 13.0–17.0)
MCH: 31.3 pg (ref 26.0–34.0)
MCHC: 30.6 g/dL (ref 30.0–36.0)
MCV: 102.3 fL — ABNORMAL HIGH (ref 80.0–100.0)
Platelets: 199 10*3/uL (ref 150–400)
RBC: 2.65 MIL/uL — ABNORMAL LOW (ref 4.22–5.81)
RDW: 16.4 % — ABNORMAL HIGH (ref 11.5–15.5)
WBC: 5.2 10*3/uL (ref 4.0–10.5)
nRBC: 0 % (ref 0.0–0.2)

## 2021-12-05 LAB — GLUCOSE, CAPILLARY: Glucose-Capillary: 123 mg/dL — ABNORMAL HIGH (ref 70–99)

## 2021-12-05 LAB — BASIC METABOLIC PANEL
Anion gap: 13 (ref 5–15)
BUN: 21 mg/dL (ref 8–23)
CO2: 29 mmol/L (ref 22–32)
Calcium: 8.1 mg/dL — ABNORMAL LOW (ref 8.9–10.3)
Chloride: 96 mmol/L — ABNORMAL LOW (ref 98–111)
Creatinine, Ser: 5.09 mg/dL — ABNORMAL HIGH (ref 0.61–1.24)
GFR, Estimated: 11 mL/min — ABNORMAL LOW (ref >60)
Glucose, Bld: 131 mg/dL — ABNORMAL HIGH (ref 70–99)
Potassium: 3.5 mmol/L (ref 3.5–5.1)
Sodium: 138 mmol/L (ref 135–145)

## 2021-12-05 LAB — HEMOGLOBIN A1C
Hgb A1c MFr Bld: 7.1 % — ABNORMAL HIGH (ref 4.8–5.6)
Mean Plasma Glucose: 157.07 mg/dL

## 2021-12-05 SURGERY — TURBT (TRANSURETHRAL RESECTION OF BLADDER TUMOR)
Anesthesia: General | Site: Ureter

## 2021-12-05 MED ORDER — SODIUM CHLORIDE 0.9 % IV SOLN: INTRAVENOUS | Status: DC

## 2021-12-05 MED ORDER — BELLADONNA ALKALOIDS-OPIUM 16.2-60 MG RE SUPP
1.0000 | Freq: Once | RECTAL | Status: AC
  Administered 2021-12-05: 1 via RECTAL
  Filled 2021-12-05: qty 1

## 2021-12-05 MED ORDER — PROPOFOL 10 MG/ML IV BOLUS
INTRAVENOUS | Status: AC
  Filled 2021-12-05: qty 20

## 2021-12-05 MED ORDER — FENTANYL CITRATE (PF) 100 MCG/2ML IJ SOLN
INTRAMUSCULAR | Status: AC
  Filled 2021-12-05: qty 2

## 2021-12-05 MED ORDER — CEFAZOLIN SODIUM-DEXTROSE 2-4 GM/100ML-% IV SOLN
2.0000 g | INTRAVENOUS | Status: AC
  Administered 2021-12-05: 2 g via INTRAVENOUS
  Filled 2021-12-05: qty 100

## 2021-12-05 MED ORDER — TRAMADOL HCL 50 MG PO TABS: 50.0000 mg | ORAL_TABLET | Freq: Four times a day (QID) | ORAL | 0 refills | Status: DC | PRN

## 2021-12-05 MED ORDER — LIDOCAINE HCL (CARDIAC) PF 100 MG/5ML IV SOSY
PREFILLED_SYRINGE | INTRAVENOUS | Status: DC | PRN
  Administered 2021-12-05: 50 mg via INTRAVENOUS

## 2021-12-05 MED ORDER — ONDANSETRON HCL 4 MG/2ML IJ SOLN
INTRAMUSCULAR | Status: DC | PRN
  Administered 2021-12-05: 4 mg via INTRAVENOUS

## 2021-12-05 MED ORDER — ACETAMINOPHEN 10 MG/ML IV SOLN: 1000.0000 mg | Freq: Once | INTRAVENOUS | Status: DC | PRN

## 2021-12-05 MED ORDER — ORAL CARE MOUTH RINSE: 15.0000 mL | Freq: Once | OROMUCOSAL | Status: AC

## 2021-12-05 MED ORDER — FENTANYL CITRATE (PF) 100 MCG/2ML IJ SOLN
INTRAMUSCULAR | Status: DC | PRN
  Administered 2021-12-05 (×2): 50 ug via INTRAVENOUS

## 2021-12-05 MED ORDER — ONDANSETRON HCL 4 MG/2ML IJ SOLN
INTRAMUSCULAR | Status: AC
  Filled 2021-12-05: qty 2

## 2021-12-05 MED ORDER — PROPOFOL 10 MG/ML IV BOLUS
INTRAVENOUS | Status: DC | PRN
  Administered 2021-12-05: 160 mg via INTRAVENOUS

## 2021-12-05 MED ORDER — IOPAMIDOL (ISOVUE-300) INJECTION 61%
INTRAVENOUS | Status: DC | PRN
  Administered 2021-12-05: 7 mL

## 2021-12-05 MED ORDER — FENTANYL CITRATE PF 50 MCG/ML IJ SOSY: 25.0000 ug | PREFILLED_SYRINGE | INTRAMUSCULAR | Status: DC | PRN

## 2021-12-05 MED ORDER — CHLORHEXIDINE GLUCONATE 0.12 % MT SOLN
15.0000 mL | Freq: Once | OROMUCOSAL | Status: AC
  Administered 2021-12-05: 15 mL via OROMUCOSAL

## 2021-12-05 MED ORDER — LIDOCAINE HCL (PF) 2 % IJ SOLN
INTRAMUSCULAR | Status: AC
  Filled 2021-12-05: qty 5

## 2021-12-05 MED ORDER — LACTATED RINGERS IV SOLN: INTRAVENOUS | Status: DC

## 2021-12-05 MED ORDER — STERILE WATER FOR IRRIGATION IR SOLN
Status: DC | PRN
  Administered 2021-12-05: 3000 mL

## 2021-12-05 MED ORDER — FENTANYL CITRATE PF 50 MCG/ML IJ SOSY
PREFILLED_SYRINGE | INTRAMUSCULAR | Status: AC
  Filled 2021-12-05: qty 3

## 2021-12-05 MED ORDER — TRAMADOL HCL 50 MG PO TABS
50.0000 mg | ORAL_TABLET | Freq: Four times a day (QID) | ORAL | 0 refills | Status: DC | PRN
Start: 2021-12-05 — End: 2022-03-13

## 2021-12-05 SURGICAL SUPPLY — 24 items
BAG COUNTER SPONGE SURGICOUNT (BAG) IMPLANT
BAG DRN RND TRDRP ANRFLXCHMBR (UROLOGICAL SUPPLIES)
BAG SPNG CNTER NS LX DISP (BAG)
BAG URINE DRAIN 2000ML AR STRL (UROLOGICAL SUPPLIES) IMPLANT
BAG URO CATCHER STRL LF (MISCELLANEOUS) ×3 IMPLANT
CATH URET 5FR 28IN CONE TIP (BALLOONS) ×1
CATH URET 5FR 70CM CONE TIP (BALLOONS) IMPLANT
CATH URETL OPEN END 6FR 70 (CATHETERS) IMPLANT
CLOTH BEACON ORANGE TIMEOUT ST (SAFETY) ×3 IMPLANT
DRAPE FOOT SWITCH (DRAPES) ×3 IMPLANT
ELECT REM PT RETURN 15FT ADLT (MISCELLANEOUS) IMPLANT
GLOVE SURG ENC TEXT LTX SZ7.5 (GLOVE) ×3 IMPLANT
GOWN STRL REUS W/TWL LRG LVL3 (GOWN DISPOSABLE) ×3 IMPLANT
GOWN STRL REUS W/TWL XL LVL3 (GOWN DISPOSABLE) ×3 IMPLANT
GUIDEWIRE STR DUAL SENSOR (WIRE) ×3 IMPLANT
KIT TURNOVER KIT A (KITS) IMPLANT
LOOP CUT BIPOLAR 24F LRG (ELECTROSURGICAL) IMPLANT
MANIFOLD NEPTUNE II (INSTRUMENTS) ×3 IMPLANT
NS IRRIG 1000ML POUR BTL (IV SOLUTION) IMPLANT
PACK CYSTO (CUSTOM PROCEDURE TRAY) ×3 IMPLANT
SYR TOOMEY IRRIG 70ML (MISCELLANEOUS)
SYRINGE TOOMEY IRRIG 70ML (MISCELLANEOUS) IMPLANT
TUBING CONNECTING 10 (TUBING) ×3 IMPLANT
TUBING UROLOGY SET (TUBING) ×3 IMPLANT

## 2021-12-05 NOTE — ED Provider Notes (Signed)
[facility] EMERGENCY DEPARTMENT Provider Note   CSN: [id number] Arrival date & time: 12/05/21  2028     History  Chief Complaint  Patient presents with   [place] is a 71 y.o. male.   Fall Pertinent negatives include no headaches and no shortness of breath. Patient presents after fall.  Hit his head on the way down and then on the floor.  Is on Eliquis.  History of end-stage renal disease.  States he has not had the Eliquis since Saturday, and today is Tuesday.  He had a bladder procedure done today.  Last dialysis done yesterday.  No loss conscious.  No numbness weakness.  Feels at baseline now     Home Medications Prior to Admission medications   Medication Sig Start Date End Date Taking? Authorizing Provider  acetaminophen (TYLENOL) 650 MG CR tablet Take 1,300 mg by mouth at bedtime.    [provider]  Alcohol Swabs (B-D SINGLE USE SWABS REGULAR) PADS  04/22/20   [provider]  B Complex-C-Folic Acid (DIALYVITE 409) 0.8 MG TABS Take 1 tablet by mouth every evening.    [provider]  Blood Glucose Monitoring Suppl (ACCU-CHEK GUIDE ME) w/Device KIT 1 Device by Does not apply route 4 (four) times daily -  before meals and at bedtime. 11/19/18   Billie Ruddy, MD  Calcium Carbonate-Simethicone (ALKA-SELTZER HEARTBURN + GAS) 750-80 MG CHEW Chew 1 tablet by mouth 3 (three) times daily as needed (indigestion/heartburn.).    [provider]  carvedilol (COREG) 25 MG tablet Take 25 mg by mouth 2 (two) times daily. 09/04/21   [provider]  carvedilol (COREG) 6.25 MG tablet Take 1 tablet (6.25 mg total) by mouth 2 (two) times daily. Patient not taking: Reported on 12/04/2021 02/24/21   Cant[place], Anderson Malta C, PA-C  Continuous Blood Gluc Sensor (DEXCOM G6 SENSOR) MISC 1 Device by Does not apply route See admin instructions. Change every 10 days 01/31/21   Renato Shin, MD  Continuous Blood Gluc Sensor  (DEXCOM G6 SENSOR) MISC 1 Device by Does not apply route See admin instructions. Change every 10 days 02/02/21   Renato Shin, MD  Cyanocobalamin (VITAMIN B-12) 5000 MCG TBDP Take 5,000 mcg by mouth in the morning.    [provider]  Dextromethorphan-guaiFENesin (CORICIDIN HBP CONGESTION/COUGH) 10-200 MG CAPS Take 2 capsules by mouth daily as needed (cough/congestion).    [provider]  furosemide (LASIX) 80 MG tablet Take 80 mg by mouth See admin instructions. Take 80 mg by mouth twice a day and additional 80 mg before bedtime as needed for swelling of the ankles    [provider]  gabapentin (NEURONTIN) 600 MG tablet Take 0.5 tablets (300 mg total) by mouth 2 (two) times daily. Patient taking differently: Take 600 mg by mouth 2 (two) times daily. 08/02/20   Debbe Odea, MD  gabapentin (NEURONTIN) 600 MG tablet Take 300 mg by mouth in the morning, at noon, and at bedtime.    [provider]  glucose 4 GM chewable tablet Chew 1 tablet by mouth as needed for low blood sugar.    [provider]  hydrALAZINE (APRESOLINE) 50 MG tablet Take 50 mg by mouth 2 (two) times daily. 11/10/21   [provider]  isosorbide mononitrate (IMDUR) 60 MG 24 hr tablet Take 1 tablet (60 mg total) by mouth daily. Patient taking differently: Take 60 mg by mouth at bedtime. 08/03/20   Rizwan,  Saima, MD  ketorolac (ACULAR) 0.5 % ophthalmic solution Place 1 drop into both eyes daily as needed (inflamation). 11/22/20   [provider]  Lidocaine 4 % AERO Apply 1 spray topically as needed (prior to port being accessed.).    [provider]  lidocaine-prilocaine (EMLA) cream 1 application as needed (prior to port being accessed). 07/05/21   [provider]  linaclotide (LINZESS) 145 MCG CAPS capsule Take 1 capsule (145 mcg total) by mouth daily as needed (constipation). 12/04/18   Geradine Girt, DO  methocarbamol (ROBAXIN) 500 MG tablet Take 500 mg by  mouth daily as needed for muscle spasms.    [provider]  montelukast (SINGULAIR) 10 MG tablet TAKE 1 TABLET BY MOUTH EVERYDAY AT BEDTIME 02/13/21   Billie Ruddy, MD  NOVOLOG FLEXPEN 100 UNIT/ML FlexPen INJECT 3 UNITS INTO THE SKIN 3 (THREE) TIMES DAILY WITH MEALS. Patient taking differently: Inject 3-5 Units into the skin 3 (three) times daily as needed for high blood sugar. 08/31/20   Renato Shin, MD  ofloxacin (OCUFLOX) 0.3 % ophthalmic solution Place 1 drop into both eyes daily as needed (irritation.).    [provider]  omeprazole (PRILOSEC) 20 MG capsule Take 20 mg by mouth in the morning.    [provider]  ondansetron (ZOFRAN) 4 MG tablet Take 1 tablet (4 mg total) by mouth every 8 (eight) hours as needed for nausea. 08/02/20   Debbe Odea, MD  prednisoLONE acetate (PRED FORTE) 1 % ophthalmic suspension Place 1 drop into both eyes daily as needed (inflammation). 11/22/20   [provider]  rosuvastatin (CRESTOR) 20 MG tablet TAKE 1 TABLET (20 MG TOTAL) BY MOUTH DAILY AT 6 PM. Patient taking differently: Take 20 mg by mouth at bedtime. 07/13/19   Billie Ruddy, MD  sevelamer carbonate (RENVELA) 800 MG tablet Take 800 mg by mouth with breakfast, with lunch, and with evening meal. 01/10/21   [provider]  sildenafil (VIAGRA) 100 MG tablet Take 1 tablet (100 mg total) by mouth daily as needed for erectile dysfunction. 01/31/21   Renato Shin, MD  silodosin (RAPAFLO) 8 MG CAPS capsule Take 8 mg by mouth in the morning. 09/14/21   [provider]  tamsulosin (FLOMAX) 0.4 MG CAPS capsule Take 0.4 mg by mouth at bedtime. 11/16/19   [provider]  traMADol (ULTRAM) 50 MG tablet Take 1 tablet (50 mg total) by mouth every 6 (six) hours as needed. 12/05/21 12/05/22  [name], MD  traMADol (ULTRAM) 50 MG tablet Take 1 tablet (50 mg total) by mouth every 6 (six) hours as needed. 12/05/21 12/05/22  [name], MD  vitamin E  (VITAMIN E) 1000 UNIT capsule Take 1,000 Units by mouth daily.     [provider]      Allergies    Patient has no known allergies.    Review of Systems   Review of Systems  Constitutional:  Negative for appetite change.  Respiratory:  Negative for shortness of breath.   Musculoskeletal:  Negative for back pain.  Neurological:  Negative for seizures and headaches.   Physical Exam Updated Vital Signs BP 132/78 (BP Location: Right Arm)    Pulse 74    Temp 98.5 F (36.9 C) (Oral)    SpO2 100%  Physical Exam Vitals and nursing note reviewed.  HENT:     Head: Atraumatic.  Eyes:     Pupils: Pupils are equal, round, and reactive to light.  Abdominal:     Tenderness: There is no abdominal tenderness.  Musculoskeletal:     Cervical back: Neck supple. No tenderness.     Comments: Dialysis access left upper arm.  Neurological:     Mental Status: He is alert.    ED Results / Procedures / Treatments   Labs (all labs ordered are listed, but only abnormal results are displayed) Labs Reviewed - No data to display  EKG None  Radiology CT HEAD WO CONTRAST (5MM)  Result Date: 12/05/2021 CLINICAL DATA:  Head trauma, moderate-severe.  Fall, hit head EXAM: CT HEAD WITHOUT CONTRAST TECHNIQUE: Contiguous axial images were obtained from the base of the skull through the vertex without intravenous contrast. RADIATION DOSE REDUCTION: This exam was performed according to the departmental dose-optimization program which includes automated exposure control, adjustment of the mA and/or kV according to patient size and/or use of iterative reconstruction technique. COMPARISON:  09/15/2020 FINDINGS: Brain: No acute intracranial abnormality. Specifically, no hemorrhage, hydrocephalus, mass lesion, acute infarction, or significant intracranial injury. Vascular: No hyperdense vessel or unexpected calcification. Skull: No acute calvarial abnormality. Sinuses/Orbits: No acute findings. Other: None  IMPRESSION: No acute intracranial abnormality. Electronically Signed   By: Rolm Baptise M.D.   On: 12/05/2021 21:18   DG C-Arm 1-60 Min-No Report  Result Date: 12/05/2021 Fluoroscopy was utilized by the requesting physician.  No radiographic interpretation.    Procedures Procedures    Medications Ordered in ED Medications - No data to display  ED Course/ Medical Decision Making/ A&P                           Medical Decision Making Problems Addressed: End stage renal disease on dialysis [facility]): chronic illness or injury Fall, initial encounter: acute illness or injury Minor head injury, initial encounter: acute illness or injury  Amount and/or Complexity of Data Reviewed External Data Reviewed: labs.    Details: Reviewed lab work from earlier today. Radiology: ordered.  Risk Decision regarding hospitalization.  Patient presents after fall with head injury.  Hit head.  Is on anticoagulation although has not had it for a couple days now due to procedure.  Head CT done due to possible longer effective time of the anticoagulation due to his end-stage renal disease.  However level 2 trauma downgraded since he has been off it for couple days.  Head CT done and reassuring.  Reviewed labs from earlier today that showed renal failure but otherwise reassuring.  Appears stable for discharge home. I dependently interpreted the CT did not show intracranial hemorrhage.       Final Clinical Impression(s) / ED Diagnoses Final diagnoses:  Fall, initial encounter  Minor head injury, initial encounter  End stage renal disease on dialysis [facility])    Rx / DC Orders ED Discharge Orders     None         Davonna Belling, MD 12/05/21 2132

## 2021-12-05 NOTE — ED Triage Notes (Signed)
Pt had bladder surgery today, was at home and was sitting in chair, went to get up and lost balance falling and hitting his head on counter, Pt is on eliquis but has not been taking it for past few days due to surgery

## 2021-12-05 NOTE — Op Note (Signed)
Preoperative diagnosis:  1.  Bladder cancer  Postoperative diagnosis: 1.  Bladder cancer  Procedure(s): 1.  Cystoscopy, bilateral retrograde pyelogram with intraoperative interpretation, transurethral section of bladder tumor (medium size)  Surgeon: Dr. Harold Barban  Anesthesia: General  Complications: None  EBL: Minimal  Specimens: Bladder tumor  Disposition of specimens: To pathology  Intraoperative findings: Patient had several exophytic tumors and a patch along the right lateral floor of the bladder total surface area was about 2-1/2 to 3 cm after removal of the tumor and fulguration.  Erythematous area left side of the bladder wall also cauterized approximately 1 cm in size.  Bilateral retrogrades were normal without evidence of filling defect  Indication: Patient is a 71 year old African-American male history of gross painless hematuria.  He is found on office cystoscopy to have what appears to be exophytic papillary tumor in the right floor the bladder consisting of several small clusters of tumors.  Presents at this time to get cystoscopy retrogrades and TURBT  Description of procedure:  After obtaining informed consent the patient was taken to major cystoscopy suite and placed under general anesthesia.  He is placed in the dorsolithotomy position genitalia prepped and draped in usual sterile fashion.  Proper pause and timeout was performed.  21 [place] scope was advanced in the bladder without difficulty.  Patient did have some median lobe hypertrophy which made passage into the bladder somewhat difficult but I was able to advance into the bladder.  Cystoscope was utilized to visualize the bladder with 30 and 70 degree lenses with above-noted findings.  The ureteral orifice ease were well away from the tumor area.  In order to preserve pathologic integrity it was felt that the tumor would be removed utilizing rigid biopsy forceps.  Therefore rigid biopsy forceps were utilized to  remove the majority of the tumor mass with several bites of tissue.  The remaining small areas of tumor were able to be scraped from the bladder wall utilizing the biopsy forceps jaws.  No remaining tumor was noted.  Bugbee electrode was utilized to cauterize the base of the resected area and an area circumferential to this to ensure complete resection.  There was a small erythematous flat area which I felt was probably inflammatory in nature in the left lateral bladder wall and I Bugbee cauterized this.  Attention was then directed towards retrograde pyelograms.  A 6 French cone-tip catheter utilized to perform retrograde pyelograms.  Retrogrades were performed and revealed prompt filling of both upper tracts without evidence of filling defect or hydronephrosis.  Both upper tracts emptied out properly upon removal of the retrograde catheter.  Bladder was emptied and the areas of resection were reinspected with good hemostasis.  Scope was removed and procedure terminated.  He was awakened from anesthesia and taken back to recovery in stable condition.  No immediate complication from the procedure.  BNO suppositories placed termination case for post operative analgesia.     Bladder cancer

## 2021-12-05 NOTE — Interval H&P Note (Signed)
History and Physical Interval Note:  12/05/2021 11:18 AM  William Webb  has presented today for surgery, with the diagnosis of BLADDER CANCER, HEMATURIA.  The various methods of treatment have been discussed with the patient and family. After consideration of risks, benefits and other options for treatment, the patient has consented to  Procedure(s) with comments: TRANSURETHRAL RESECTION OF BLADDER TUMOR (TURBT) (N/A) - 45 MINS CYSTOSCOPY WITH RETROGRADE PYELOGRAM (Bilateral) as a surgical intervention.  The patient's history has been reviewed, patient examined, no change in status, stable for surgery.  I have reviewed the patient's chart and labs.  Questions were answered to the patient's satisfaction.     [name]

## 2021-12-05 NOTE — Anesthesia Procedure Notes (Signed)
Procedure Name: LMA Insertion Date/Time: 12/05/2021 12:30 PM Performed by: Jonna Munro, CRNA Pre-anesthesia Checklist: Patient identified, Emergency Drugs available, Suction available, Patient being monitored and Timeout performed Patient Re-evaluated:Patient Re-evaluated prior to induction Oxygen Delivery Method: Circle system utilized Preoxygenation: Pre-oxygenation with 100% oxygen Induction Type: IV induction LMA: LMA inserted LMA Size: 5.0 Number of attempts: 1 Placement Confirmation: positive ETCO2, CO2 detector and breath sounds checked- equal and bilateral Tube secured with: Tape Dental Injury: Teeth and Oropharynx as per pre-operative assessment

## 2021-12-06 ENCOUNTER — Encounter (HOSPITAL_COMMUNITY): Payer: Self-pay | Admitting: Urology

## 2021-12-06 DIAGNOSIS — N186 End stage renal disease: Secondary | ICD-10-CM | POA: Diagnosis not present

## 2021-12-06 DIAGNOSIS — N400 Enlarged prostate without lower urinary tract symptoms: Secondary | ICD-10-CM | POA: Diagnosis not present

## 2021-12-06 DIAGNOSIS — Z992 Dependence on renal dialysis: Secondary | ICD-10-CM | POA: Diagnosis not present

## 2021-12-06 DIAGNOSIS — C679 Malignant neoplasm of bladder, unspecified: Secondary | ICD-10-CM | POA: Diagnosis not present

## 2021-12-06 DIAGNOSIS — R310 Gross hematuria: Secondary | ICD-10-CM | POA: Diagnosis not present

## 2021-12-06 DIAGNOSIS — Z87891 Personal history of nicotine dependence: Secondary | ICD-10-CM | POA: Diagnosis not present

## 2021-12-06 DIAGNOSIS — N2581 Secondary hyperparathyroidism of renal origin: Secondary | ICD-10-CM | POA: Diagnosis not present

## 2021-12-06 LAB — SURGICAL PATHOLOGY

## 2021-12-06 NOTE — Anesthesia Postprocedure Evaluation (Signed)
Anesthesia Post Note  Patient: William Webb [facility]  Procedure(s) Performed: TRANSURETHRAL RESECTION OF BLADDER TUMOR (Bladder) CYSTOSCOPY WITH RETROGRADE PYELOGRAM WITH OPERATIVE INTERPRETATION (Bilateral: Ureter)     Patient location during evaluation: PACU Anesthesia Type: General Level of consciousness: awake and alert Pain management: pain level controlled Vital Signs Assessment: post-procedure vital signs reviewed and stable Respiratory status: spontaneous breathing, nonlabored ventilation, respiratory function stable and patient connected to nasal cannula oxygen Cardiovascular status: blood pressure returned to baseline and stable Postop Assessment: no apparent nausea or vomiting Anesthetic complications: no   No notable events documented.  Last Vitals:  Vitals:   12/05/21 1357 12/05/21 1408  BP: (!) 172/82 139/74  Pulse: 74 90  Resp: 14 15  Temp: 37.1 C   SpO2: 99% (P) 94%    Last Pain:  Vitals:   12/05/21 1408  TempSrc:   PainSc: 0-No pain                 Belenda Cruise P Kyzer Blowe

## 2021-12-06 NOTE — Transfer of Care (Signed)
Immediate Anesthesia Transfer of Care Note  Patient: William Webb [facility]  Procedure(s) Performed: TRANSURETHRAL RESECTION OF BLADDER TUMOR (Bladder) CYSTOSCOPY WITH RETROGRADE PYELOGRAM WITH OPERATIVE INTERPRETATION (Bilateral: Ureter)  Patient Location: PACU  Anesthesia Type:General  Level of Consciousness: awake, alert , oriented and patient cooperative  Airway & Oxygen Therapy: Patient Spontanous Breathing and Patient connected to face mask oxygen  Post-op Assessment: Report given to RN, Post -op Vital signs reviewed and stable and Patient moving all extremities X 4  Post vital signs: Reviewed and stable  Last Vitals:  Vitals Value Taken Time  BP 139/74 12/05/21 1408  Temp 37.1 C 12/05/21 1357  Pulse 86 12/05/21 1410  Resp 15 12/05/21 1408  SpO2 92 % 12/05/21 1410  Vitals shown include unvalidated device data.  Last Pain:  Vitals:   12/05/21 1408  TempSrc:   PainSc: 0-No pain      Patients Stated Pain Goal: 3 (79/02/40 9735)  Complications: No notable events documented.

## 2021-12-08 DIAGNOSIS — Z992 Dependence on renal dialysis: Secondary | ICD-10-CM | POA: Diagnosis not present

## 2021-12-08 DIAGNOSIS — N2581 Secondary hyperparathyroidism of renal origin: Secondary | ICD-10-CM | POA: Diagnosis not present

## 2021-12-08 DIAGNOSIS — N186 End stage renal disease: Secondary | ICD-10-CM | POA: Diagnosis not present

## 2021-12-11 DIAGNOSIS — N186 End stage renal disease: Secondary | ICD-10-CM | POA: Diagnosis not present

## 2021-12-11 DIAGNOSIS — N2581 Secondary hyperparathyroidism of renal origin: Secondary | ICD-10-CM | POA: Diagnosis not present

## 2021-12-11 DIAGNOSIS — Z992 Dependence on renal dialysis: Secondary | ICD-10-CM | POA: Diagnosis not present

## 2021-12-13 DIAGNOSIS — N186 End stage renal disease: Secondary | ICD-10-CM | POA: Diagnosis not present

## 2021-12-13 DIAGNOSIS — N2581 Secondary hyperparathyroidism of renal origin: Secondary | ICD-10-CM | POA: Diagnosis not present

## 2021-12-13 DIAGNOSIS — Z992 Dependence on renal dialysis: Secondary | ICD-10-CM | POA: Diagnosis not present

## 2021-12-14 DIAGNOSIS — C678 Malignant neoplasm of overlapping sites of bladder: Secondary | ICD-10-CM | POA: Diagnosis not present

## 2021-12-14 NOTE — Telephone Encounter (Signed)
This encounter was created in error - please disregard.

## 2021-12-15 DIAGNOSIS — N186 End stage renal disease: Secondary | ICD-10-CM | POA: Diagnosis not present

## 2021-12-15 DIAGNOSIS — Z992 Dependence on renal dialysis: Secondary | ICD-10-CM | POA: Diagnosis not present

## 2021-12-15 DIAGNOSIS — N2581 Secondary hyperparathyroidism of renal origin: Secondary | ICD-10-CM | POA: Diagnosis not present

## 2021-12-18 DIAGNOSIS — N186 End stage renal disease: Secondary | ICD-10-CM | POA: Diagnosis not present

## 2021-12-18 DIAGNOSIS — N2581 Secondary hyperparathyroidism of renal origin: Secondary | ICD-10-CM | POA: Diagnosis not present

## 2021-12-18 DIAGNOSIS — Z992 Dependence on renal dialysis: Secondary | ICD-10-CM | POA: Diagnosis not present

## 2021-12-20 DIAGNOSIS — N2581 Secondary hyperparathyroidism of renal origin: Secondary | ICD-10-CM | POA: Diagnosis not present

## 2021-12-20 DIAGNOSIS — Z992 Dependence on renal dialysis: Secondary | ICD-10-CM | POA: Diagnosis not present

## 2021-12-20 DIAGNOSIS — N186 End stage renal disease: Secondary | ICD-10-CM | POA: Diagnosis not present

## 2021-12-22 DIAGNOSIS — Z992 Dependence on renal dialysis: Secondary | ICD-10-CM | POA: Diagnosis not present

## 2021-12-22 DIAGNOSIS — N186 End stage renal disease: Secondary | ICD-10-CM | POA: Diagnosis not present

## 2021-12-22 DIAGNOSIS — N2581 Secondary hyperparathyroidism of renal origin: Secondary | ICD-10-CM | POA: Diagnosis not present

## 2021-12-25 DIAGNOSIS — N2581 Secondary hyperparathyroidism of renal origin: Secondary | ICD-10-CM | POA: Diagnosis not present

## 2021-12-25 DIAGNOSIS — N186 End stage renal disease: Secondary | ICD-10-CM | POA: Diagnosis not present

## 2021-12-25 DIAGNOSIS — Z992 Dependence on renal dialysis: Secondary | ICD-10-CM | POA: Diagnosis not present

## 2021-12-26 DIAGNOSIS — N186 End stage renal disease: Secondary | ICD-10-CM | POA: Diagnosis not present

## 2021-12-26 DIAGNOSIS — E1129 Type 2 diabetes mellitus with other diabetic kidney complication: Secondary | ICD-10-CM | POA: Diagnosis not present

## 2021-12-26 DIAGNOSIS — Z992 Dependence on renal dialysis: Secondary | ICD-10-CM | POA: Diagnosis not present

## 2021-12-27 DIAGNOSIS — Z992 Dependence on renal dialysis: Secondary | ICD-10-CM | POA: Diagnosis not present

## 2021-12-27 DIAGNOSIS — N2581 Secondary hyperparathyroidism of renal origin: Secondary | ICD-10-CM | POA: Diagnosis not present

## 2021-12-27 DIAGNOSIS — N186 End stage renal disease: Secondary | ICD-10-CM | POA: Diagnosis not present

## 2021-12-29 DIAGNOSIS — N186 End stage renal disease: Secondary | ICD-10-CM | POA: Diagnosis not present

## 2021-12-29 DIAGNOSIS — N2581 Secondary hyperparathyroidism of renal origin: Secondary | ICD-10-CM | POA: Diagnosis not present

## 2021-12-29 DIAGNOSIS — Z992 Dependence on renal dialysis: Secondary | ICD-10-CM | POA: Diagnosis not present

## 2021-12-30 DIAGNOSIS — I4819 Other persistent atrial fibrillation: Secondary | ICD-10-CM | POA: Diagnosis not present

## 2022-01-01 DIAGNOSIS — N2581 Secondary hyperparathyroidism of renal origin: Secondary | ICD-10-CM | POA: Diagnosis not present

## 2022-01-01 DIAGNOSIS — Z992 Dependence on renal dialysis: Secondary | ICD-10-CM | POA: Diagnosis not present

## 2022-01-01 DIAGNOSIS — N186 End stage renal disease: Secondary | ICD-10-CM | POA: Diagnosis not present

## 2022-01-03 DIAGNOSIS — N2581 Secondary hyperparathyroidism of renal origin: Secondary | ICD-10-CM | POA: Diagnosis not present

## 2022-01-03 DIAGNOSIS — N186 End stage renal disease: Secondary | ICD-10-CM | POA: Diagnosis not present

## 2022-01-03 DIAGNOSIS — Z992 Dependence on renal dialysis: Secondary | ICD-10-CM | POA: Diagnosis not present

## 2022-01-05 DIAGNOSIS — N2581 Secondary hyperparathyroidism of renal origin: Secondary | ICD-10-CM | POA: Diagnosis not present

## 2022-01-05 DIAGNOSIS — Z992 Dependence on renal dialysis: Secondary | ICD-10-CM | POA: Diagnosis not present

## 2022-01-05 DIAGNOSIS — N186 End stage renal disease: Secondary | ICD-10-CM | POA: Diagnosis not present

## 2022-01-09 DIAGNOSIS — N2581 Secondary hyperparathyroidism of renal origin: Secondary | ICD-10-CM | POA: Diagnosis not present

## 2022-01-09 DIAGNOSIS — Z992 Dependence on renal dialysis: Secondary | ICD-10-CM | POA: Diagnosis not present

## 2022-01-09 DIAGNOSIS — N186 End stage renal disease: Secondary | ICD-10-CM | POA: Diagnosis not present

## 2022-01-10 DIAGNOSIS — N2581 Secondary hyperparathyroidism of renal origin: Secondary | ICD-10-CM | POA: Diagnosis not present

## 2022-01-10 DIAGNOSIS — N186 End stage renal disease: Secondary | ICD-10-CM | POA: Diagnosis not present

## 2022-01-10 DIAGNOSIS — Z992 Dependence on renal dialysis: Secondary | ICD-10-CM | POA: Diagnosis not present

## 2022-01-12 DIAGNOSIS — N186 End stage renal disease: Secondary | ICD-10-CM | POA: Diagnosis not present

## 2022-01-12 DIAGNOSIS — N2581 Secondary hyperparathyroidism of renal origin: Secondary | ICD-10-CM | POA: Diagnosis not present

## 2022-01-12 DIAGNOSIS — Z992 Dependence on renal dialysis: Secondary | ICD-10-CM | POA: Diagnosis not present

## 2022-01-15 DIAGNOSIS — N186 End stage renal disease: Secondary | ICD-10-CM | POA: Diagnosis not present

## 2022-01-15 DIAGNOSIS — Z992 Dependence on renal dialysis: Secondary | ICD-10-CM | POA: Diagnosis not present

## 2022-01-15 DIAGNOSIS — N2581 Secondary hyperparathyroidism of renal origin: Secondary | ICD-10-CM | POA: Diagnosis not present

## 2022-01-17 DIAGNOSIS — N186 End stage renal disease: Secondary | ICD-10-CM | POA: Diagnosis not present

## 2022-01-17 DIAGNOSIS — N2581 Secondary hyperparathyroidism of renal origin: Secondary | ICD-10-CM | POA: Diagnosis not present

## 2022-01-17 DIAGNOSIS — Z992 Dependence on renal dialysis: Secondary | ICD-10-CM | POA: Diagnosis not present

## 2022-01-19 DIAGNOSIS — N186 End stage renal disease: Secondary | ICD-10-CM | POA: Diagnosis not present

## 2022-01-19 DIAGNOSIS — N2581 Secondary hyperparathyroidism of renal origin: Secondary | ICD-10-CM | POA: Diagnosis not present

## 2022-01-19 DIAGNOSIS — Z992 Dependence on renal dialysis: Secondary | ICD-10-CM | POA: Diagnosis not present

## 2022-01-22 DIAGNOSIS — N2581 Secondary hyperparathyroidism of renal origin: Secondary | ICD-10-CM | POA: Diagnosis not present

## 2022-01-22 DIAGNOSIS — N186 End stage renal disease: Secondary | ICD-10-CM | POA: Diagnosis not present

## 2022-01-22 DIAGNOSIS — Z992 Dependence on renal dialysis: Secondary | ICD-10-CM | POA: Diagnosis not present

## 2022-01-24 DIAGNOSIS — Z992 Dependence on renal dialysis: Secondary | ICD-10-CM | POA: Diagnosis not present

## 2022-01-24 DIAGNOSIS — N2581 Secondary hyperparathyroidism of renal origin: Secondary | ICD-10-CM | POA: Diagnosis not present

## 2022-01-24 DIAGNOSIS — N186 End stage renal disease: Secondary | ICD-10-CM | POA: Diagnosis not present

## 2022-01-26 DIAGNOSIS — N2581 Secondary hyperparathyroidism of renal origin: Secondary | ICD-10-CM | POA: Diagnosis not present

## 2022-01-26 DIAGNOSIS — E1129 Type 2 diabetes mellitus with other diabetic kidney complication: Secondary | ICD-10-CM | POA: Diagnosis not present

## 2022-01-26 DIAGNOSIS — N186 End stage renal disease: Secondary | ICD-10-CM | POA: Diagnosis not present

## 2022-01-26 DIAGNOSIS — Z992 Dependence on renal dialysis: Secondary | ICD-10-CM | POA: Diagnosis not present

## 2022-01-29 DIAGNOSIS — N186 End stage renal disease: Secondary | ICD-10-CM | POA: Diagnosis not present

## 2022-01-29 DIAGNOSIS — Z992 Dependence on renal dialysis: Secondary | ICD-10-CM | POA: Diagnosis not present

## 2022-01-29 DIAGNOSIS — N2581 Secondary hyperparathyroidism of renal origin: Secondary | ICD-10-CM | POA: Diagnosis not present

## 2022-01-31 DIAGNOSIS — N186 End stage renal disease: Secondary | ICD-10-CM | POA: Diagnosis not present

## 2022-01-31 DIAGNOSIS — Z992 Dependence on renal dialysis: Secondary | ICD-10-CM | POA: Diagnosis not present

## 2022-01-31 DIAGNOSIS — N2581 Secondary hyperparathyroidism of renal origin: Secondary | ICD-10-CM | POA: Diagnosis not present

## 2022-02-02 DIAGNOSIS — Z992 Dependence on renal dialysis: Secondary | ICD-10-CM | POA: Diagnosis not present

## 2022-02-02 DIAGNOSIS — N186 End stage renal disease: Secondary | ICD-10-CM | POA: Diagnosis not present

## 2022-02-02 DIAGNOSIS — N2581 Secondary hyperparathyroidism of renal origin: Secondary | ICD-10-CM | POA: Diagnosis not present

## 2022-02-05 DIAGNOSIS — N2581 Secondary hyperparathyroidism of renal origin: Secondary | ICD-10-CM | POA: Diagnosis not present

## 2022-02-05 DIAGNOSIS — N186 End stage renal disease: Secondary | ICD-10-CM | POA: Diagnosis not present

## 2022-02-05 DIAGNOSIS — Z992 Dependence on renal dialysis: Secondary | ICD-10-CM | POA: Diagnosis not present

## 2022-02-07 DIAGNOSIS — N2581 Secondary hyperparathyroidism of renal origin: Secondary | ICD-10-CM | POA: Diagnosis not present

## 2022-02-07 DIAGNOSIS — N186 End stage renal disease: Secondary | ICD-10-CM | POA: Diagnosis not present

## 2022-02-07 DIAGNOSIS — Z992 Dependence on renal dialysis: Secondary | ICD-10-CM | POA: Diagnosis not present

## 2022-02-09 DIAGNOSIS — N186 End stage renal disease: Secondary | ICD-10-CM | POA: Diagnosis not present

## 2022-02-09 DIAGNOSIS — Z992 Dependence on renal dialysis: Secondary | ICD-10-CM | POA: Diagnosis not present

## 2022-02-09 DIAGNOSIS — N2581 Secondary hyperparathyroidism of renal origin: Secondary | ICD-10-CM | POA: Diagnosis not present

## 2022-02-12 DIAGNOSIS — N186 End stage renal disease: Secondary | ICD-10-CM | POA: Diagnosis not present

## 2022-02-12 DIAGNOSIS — N2581 Secondary hyperparathyroidism of renal origin: Secondary | ICD-10-CM | POA: Diagnosis not present

## 2022-02-12 DIAGNOSIS — Z992 Dependence on renal dialysis: Secondary | ICD-10-CM | POA: Diagnosis not present

## 2022-02-14 DIAGNOSIS — N186 End stage renal disease: Secondary | ICD-10-CM | POA: Diagnosis not present

## 2022-02-14 DIAGNOSIS — N2581 Secondary hyperparathyroidism of renal origin: Secondary | ICD-10-CM | POA: Diagnosis not present

## 2022-02-14 DIAGNOSIS — Z992 Dependence on renal dialysis: Secondary | ICD-10-CM | POA: Diagnosis not present

## 2022-02-16 DIAGNOSIS — N186 End stage renal disease: Secondary | ICD-10-CM | POA: Diagnosis not present

## 2022-02-16 DIAGNOSIS — Z992 Dependence on renal dialysis: Secondary | ICD-10-CM | POA: Diagnosis not present

## 2022-02-16 DIAGNOSIS — N2581 Secondary hyperparathyroidism of renal origin: Secondary | ICD-10-CM | POA: Diagnosis not present

## 2022-02-19 DIAGNOSIS — Z992 Dependence on renal dialysis: Secondary | ICD-10-CM | POA: Diagnosis not present

## 2022-02-19 DIAGNOSIS — N186 End stage renal disease: Secondary | ICD-10-CM | POA: Diagnosis not present

## 2022-02-19 DIAGNOSIS — N2581 Secondary hyperparathyroidism of renal origin: Secondary | ICD-10-CM | POA: Diagnosis not present

## 2022-02-21 DIAGNOSIS — Z992 Dependence on renal dialysis: Secondary | ICD-10-CM | POA: Diagnosis not present

## 2022-02-21 DIAGNOSIS — N2581 Secondary hyperparathyroidism of renal origin: Secondary | ICD-10-CM | POA: Diagnosis not present

## 2022-02-21 DIAGNOSIS — N186 End stage renal disease: Secondary | ICD-10-CM | POA: Diagnosis not present

## 2022-02-22 ENCOUNTER — Other Ambulatory Visit: Payer: Self-pay

## 2022-02-22 ENCOUNTER — Encounter (HOSPITAL_COMMUNITY): Payer: Self-pay

## 2022-02-22 MED ORDER — APIXABAN 5 MG PO TABS: 5.0000 mg | ORAL_TABLET | Freq: Two times a day (BID) | ORAL | 3 refills | Status: DC

## 2022-02-23 DIAGNOSIS — Z992 Dependence on renal dialysis: Secondary | ICD-10-CM | POA: Diagnosis not present

## 2022-02-23 DIAGNOSIS — N186 End stage renal disease: Secondary | ICD-10-CM | POA: Diagnosis not present

## 2022-02-23 DIAGNOSIS — N2581 Secondary hyperparathyroidism of renal origin: Secondary | ICD-10-CM | POA: Diagnosis not present

## 2022-02-25 DIAGNOSIS — Z992 Dependence on renal dialysis: Secondary | ICD-10-CM | POA: Diagnosis not present

## 2022-02-25 DIAGNOSIS — N186 End stage renal disease: Secondary | ICD-10-CM | POA: Diagnosis not present

## 2022-02-25 DIAGNOSIS — E1129 Type 2 diabetes mellitus with other diabetic kidney complication: Secondary | ICD-10-CM | POA: Diagnosis not present

## 2022-02-26 DIAGNOSIS — N186 End stage renal disease: Secondary | ICD-10-CM | POA: Diagnosis not present

## 2022-02-26 DIAGNOSIS — Z992 Dependence on renal dialysis: Secondary | ICD-10-CM | POA: Diagnosis not present

## 2022-02-26 DIAGNOSIS — N2581 Secondary hyperparathyroidism of renal origin: Secondary | ICD-10-CM | POA: Diagnosis not present

## 2022-02-28 DIAGNOSIS — Z992 Dependence on renal dialysis: Secondary | ICD-10-CM | POA: Diagnosis not present

## 2022-02-28 DIAGNOSIS — N2581 Secondary hyperparathyroidism of renal origin: Secondary | ICD-10-CM | POA: Diagnosis not present

## 2022-02-28 DIAGNOSIS — N186 End stage renal disease: Secondary | ICD-10-CM | POA: Diagnosis not present

## 2022-03-01 ENCOUNTER — Ambulatory Visit: Payer: Medicare HMO | Admitting: Podiatry

## 2022-03-01 ENCOUNTER — Ambulatory Visit (INDEPENDENT_AMBULATORY_CARE_PROVIDER_SITE_OTHER): Payer: Medicare HMO

## 2022-03-01 ENCOUNTER — Encounter: Payer: Self-pay | Admitting: Podiatry

## 2022-03-01 DIAGNOSIS — E11621 Type 2 diabetes mellitus with foot ulcer: Secondary | ICD-10-CM | POA: Diagnosis not present

## 2022-03-01 DIAGNOSIS — B351 Tinea unguium: Secondary | ICD-10-CM | POA: Diagnosis not present

## 2022-03-01 DIAGNOSIS — L97512 Non-pressure chronic ulcer of other part of right foot with fat layer exposed: Secondary | ICD-10-CM

## 2022-03-01 DIAGNOSIS — M79674 Pain in right toe(s): Secondary | ICD-10-CM

## 2022-03-01 DIAGNOSIS — M79675 Pain in left toe(s): Secondary | ICD-10-CM

## 2022-03-01 MED ORDER — DOXYCYCLINE HYCLATE 100 MG PO TABS: 100.0000 mg | ORAL_TABLET | Freq: Two times a day (BID) | ORAL | 1 refills | Status: DC

## 2022-03-01 NOTE — Progress Notes (Signed)
Subjective:  ? ?Patient ID: William Webb, male   DOB: 71 y.o.   MRN: [id number]  ? ?HPI ?Patient presents after not being seen for several years with a breakdown of tissue on the right second toe admitting he traumatized in the last couple weeks and patient does do dialysis and is seen by his kidney doctor for his circulation.  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 ? ?ROS ? ? ?   ?Objective:  ?Physical Exam  ?Traumatic ulceration of the right distal second toe localized with no proximal edema erythema drainage noted currently with very poor circulation noted and quite a bit of calcification on the x-ray.  Has thick yellow brittle nailbeds 1-5 both feet that are high risk and he cannot take care of ? ?   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 ?   ?Plan:  ?Reviewed his vascular disease and he is followed by the kidney doctor and I do not think at this point given his calcification that revascularization is an alternative.  Hopefully this will heal and we will start him on doxycycline he will had an open toed shoe dispensed surgical today and he will do soaks.  He will be seen back he understands he is at high risk and the chances for amputation if it does not heal for the second toe are high.  Encouraged to call with any questions or issues which may come up ? ?X-rays indicate there may be some lysis of the distal phalanx digit to right and he does have significant calcification dorsalis pedis artery ?   ? ? ?

## 2022-03-02 DIAGNOSIS — Z992 Dependence on renal dialysis: Secondary | ICD-10-CM | POA: Diagnosis not present

## 2022-03-02 DIAGNOSIS — N2581 Secondary hyperparathyroidism of renal origin: Secondary | ICD-10-CM | POA: Diagnosis not present

## 2022-03-02 DIAGNOSIS — N186 End stage renal disease: Secondary | ICD-10-CM | POA: Diagnosis not present

## 2022-03-05 DIAGNOSIS — N186 End stage renal disease: Secondary | ICD-10-CM | POA: Diagnosis not present

## 2022-03-05 DIAGNOSIS — N2581 Secondary hyperparathyroidism of renal origin: Secondary | ICD-10-CM | POA: Diagnosis not present

## 2022-03-05 DIAGNOSIS — Z992 Dependence on renal dialysis: Secondary | ICD-10-CM | POA: Diagnosis not present

## 2022-03-07 DIAGNOSIS — N2581 Secondary hyperparathyroidism of renal origin: Secondary | ICD-10-CM | POA: Diagnosis not present

## 2022-03-07 DIAGNOSIS — Z992 Dependence on renal dialysis: Secondary | ICD-10-CM | POA: Diagnosis not present

## 2022-03-07 DIAGNOSIS — N186 End stage renal disease: Secondary | ICD-10-CM | POA: Diagnosis not present

## 2022-03-09 DIAGNOSIS — I10 Essential (primary) hypertension: Secondary | ICD-10-CM | POA: Diagnosis not present

## 2022-03-09 DIAGNOSIS — R55 Syncope and collapse: Secondary | ICD-10-CM | POA: Diagnosis not present

## 2022-03-09 DIAGNOSIS — R4020 Unspecified coma: Secondary | ICD-10-CM | POA: Diagnosis not present

## 2022-03-09 DIAGNOSIS — Z992 Dependence on renal dialysis: Secondary | ICD-10-CM | POA: Diagnosis not present

## 2022-03-09 DIAGNOSIS — N186 End stage renal disease: Secondary | ICD-10-CM | POA: Diagnosis not present

## 2022-03-09 DIAGNOSIS — N2581 Secondary hyperparathyroidism of renal origin: Secondary | ICD-10-CM | POA: Diagnosis not present

## 2022-03-09 DIAGNOSIS — I959 Hypotension, unspecified: Secondary | ICD-10-CM | POA: Diagnosis not present

## 2022-03-12 ENCOUNTER — Encounter (HOSPITAL_COMMUNITY): Payer: Self-pay

## 2022-03-12 ENCOUNTER — Emergency Department (HOSPITAL_COMMUNITY): Payer: Medicare HMO

## 2022-03-12 ENCOUNTER — Observation Stay (HOSPITAL_COMMUNITY): Payer: Medicare HMO

## 2022-03-12 ENCOUNTER — Other Ambulatory Visit: Payer: Self-pay

## 2022-03-12 ENCOUNTER — Inpatient Hospital Stay (HOSPITAL_COMMUNITY)
Admission: EM | Admit: 2022-03-12 | Discharge: 2022-03-20 | DRG: 616 | Disposition: A | Discharge status: Home Health | Payer: Medicare HMO | Attending: Internal Medicine | Admitting: Internal Medicine

## 2022-03-12 DIAGNOSIS — E1152 Type 2 diabetes mellitus with diabetic peripheral angiopathy with gangrene: Secondary | ICD-10-CM | POA: Diagnosis present

## 2022-03-12 DIAGNOSIS — E11319 Type 2 diabetes mellitus with unspecified diabetic retinopathy without macular edema: Secondary | ICD-10-CM | POA: Diagnosis present

## 2022-03-12 DIAGNOSIS — R9431 Abnormal electrocardiogram [ECG] [EKG]: Secondary | ICD-10-CM | POA: Diagnosis not present

## 2022-03-12 DIAGNOSIS — Z833 Family history of diabetes mellitus: Secondary | ICD-10-CM

## 2022-03-12 DIAGNOSIS — D631 Anemia in chronic kidney disease: Secondary | ICD-10-CM | POA: Diagnosis present

## 2022-03-12 DIAGNOSIS — Z7901 Long term (current) use of anticoagulants: Secondary | ICD-10-CM

## 2022-03-12 DIAGNOSIS — M86171 Other acute osteomyelitis, right ankle and foot: Secondary | ICD-10-CM | POA: Diagnosis present

## 2022-03-12 DIAGNOSIS — I70221 Atherosclerosis of native arteries of extremities with rest pain, right leg: Secondary | ICD-10-CM | POA: Diagnosis present

## 2022-03-12 DIAGNOSIS — I3139 Other pericardial effusion (noninflammatory): Secondary | ICD-10-CM | POA: Diagnosis not present

## 2022-03-12 DIAGNOSIS — I252 Old myocardial infarction: Secondary | ICD-10-CM

## 2022-03-12 DIAGNOSIS — G9340 Encephalopathy, unspecified: Secondary | ICD-10-CM | POA: Diagnosis not present

## 2022-03-12 DIAGNOSIS — I272 Pulmonary hypertension, unspecified: Secondary | ICD-10-CM | POA: Diagnosis present

## 2022-03-12 DIAGNOSIS — R778 Other specified abnormalities of plasma proteins: Secondary | ICD-10-CM | POA: Diagnosis not present

## 2022-03-12 DIAGNOSIS — Z20822 Contact with and (suspected) exposure to covid-19: Secondary | ICD-10-CM | POA: Diagnosis present

## 2022-03-12 DIAGNOSIS — M19071 Primary osteoarthritis, right ankle and foot: Secondary | ICD-10-CM | POA: Diagnosis not present

## 2022-03-12 DIAGNOSIS — E1151 Type 2 diabetes mellitus with diabetic peripheral angiopathy without gangrene: Secondary | ICD-10-CM | POA: Diagnosis present

## 2022-03-12 DIAGNOSIS — E1142 Type 2 diabetes mellitus with diabetic polyneuropathy: Secondary | ICD-10-CM | POA: Diagnosis present

## 2022-03-12 DIAGNOSIS — M7989 Other specified soft tissue disorders: Secondary | ICD-10-CM | POA: Diagnosis not present

## 2022-03-12 DIAGNOSIS — D72829 Elevated white blood cell count, unspecified: Secondary | ICD-10-CM | POA: Diagnosis present

## 2022-03-12 DIAGNOSIS — I10 Essential (primary) hypertension: Secondary | ICD-10-CM | POA: Diagnosis not present

## 2022-03-12 DIAGNOSIS — R072 Precordial pain: Secondary | ICD-10-CM | POA: Diagnosis not present

## 2022-03-12 DIAGNOSIS — L97519 Non-pressure chronic ulcer of other part of right foot with unspecified severity: Secondary | ICD-10-CM | POA: Diagnosis present

## 2022-03-12 DIAGNOSIS — Z7902 Long term (current) use of antithrombotics/antiplatelets: Secondary | ICD-10-CM

## 2022-03-12 DIAGNOSIS — Z83438 Family history of other disorder of lipoprotein metabolism and other lipidemia: Secondary | ICD-10-CM

## 2022-03-12 DIAGNOSIS — R400 Somnolence: Secondary | ICD-10-CM | POA: Diagnosis not present

## 2022-03-12 DIAGNOSIS — I4819 Other persistent atrial fibrillation: Secondary | ICD-10-CM | POA: Diagnosis present

## 2022-03-12 DIAGNOSIS — I160 Hypertensive urgency: Secondary | ICD-10-CM | POA: Diagnosis present

## 2022-03-12 DIAGNOSIS — M868X7 Other osteomyelitis, ankle and foot: Secondary | ICD-10-CM | POA: Diagnosis not present

## 2022-03-12 DIAGNOSIS — N186 End stage renal disease: Secondary | ICD-10-CM | POA: Diagnosis present

## 2022-03-12 DIAGNOSIS — N250 Renal osteodystrophy: Secondary | ICD-10-CM | POA: Diagnosis not present

## 2022-03-12 DIAGNOSIS — M869 Osteomyelitis, unspecified: Secondary | ICD-10-CM | POA: Diagnosis present

## 2022-03-12 DIAGNOSIS — A419 Sepsis, unspecified organism: Principal | ICD-10-CM

## 2022-03-12 DIAGNOSIS — Z992 Dependence on renal dialysis: Secondary | ICD-10-CM

## 2022-03-12 DIAGNOSIS — R0602 Shortness of breath: Secondary | ICD-10-CM | POA: Diagnosis not present

## 2022-03-12 DIAGNOSIS — Z96651 Presence of right artificial knee joint: Secondary | ICD-10-CM | POA: Diagnosis present

## 2022-03-12 DIAGNOSIS — R195 Other fecal abnormalities: Secondary | ICD-10-CM | POA: Diagnosis present

## 2022-03-12 DIAGNOSIS — I2510 Atherosclerotic heart disease of native coronary artery without angina pectoris: Secondary | ICD-10-CM | POA: Diagnosis present

## 2022-03-12 DIAGNOSIS — M797 Fibromyalgia: Secondary | ICD-10-CM | POA: Diagnosis present

## 2022-03-12 DIAGNOSIS — Z8249 Family history of ischemic heart disease and other diseases of the circulatory system: Secondary | ICD-10-CM

## 2022-03-12 DIAGNOSIS — B351 Tinea unguium: Secondary | ICD-10-CM | POA: Diagnosis present

## 2022-03-12 DIAGNOSIS — E876 Hypokalemia: Secondary | ICD-10-CM | POA: Diagnosis present

## 2022-03-12 DIAGNOSIS — R7989 Other specified abnormal findings of blood chemistry: Secondary | ICD-10-CM | POA: Diagnosis not present

## 2022-03-12 DIAGNOSIS — Z23 Encounter for immunization: Secondary | ICD-10-CM

## 2022-03-12 DIAGNOSIS — R6510 Systemic inflammatory response syndrome (SIRS) of non-infectious origin without acute organ dysfunction: Secondary | ICD-10-CM | POA: Diagnosis present

## 2022-03-12 DIAGNOSIS — E1169 Type 2 diabetes mellitus with other specified complication: Secondary | ICD-10-CM | POA: Diagnosis present

## 2022-03-12 DIAGNOSIS — M86671 Other chronic osteomyelitis, right ankle and foot: Secondary | ICD-10-CM | POA: Diagnosis not present

## 2022-03-12 DIAGNOSIS — I878 Other specified disorders of veins: Secondary | ICD-10-CM | POA: Diagnosis present

## 2022-03-12 DIAGNOSIS — E785 Hyperlipidemia, unspecified: Secondary | ICD-10-CM | POA: Diagnosis not present

## 2022-03-12 DIAGNOSIS — R410 Disorientation, unspecified: Secondary | ICD-10-CM | POA: Diagnosis not present

## 2022-03-12 DIAGNOSIS — N2581 Secondary hyperparathyroidism of renal origin: Secondary | ICD-10-CM | POA: Diagnosis not present

## 2022-03-12 DIAGNOSIS — I96 Gangrene, not elsewhere classified: Secondary | ICD-10-CM | POA: Diagnosis not present

## 2022-03-12 DIAGNOSIS — I080 Rheumatic disorders of both mitral and aortic valves: Secondary | ICD-10-CM | POA: Diagnosis not present

## 2022-03-12 DIAGNOSIS — Z9841 Cataract extraction status, right eye: Secondary | ICD-10-CM

## 2022-03-12 DIAGNOSIS — I739 Peripheral vascular disease, unspecified: Secondary | ICD-10-CM | POA: Diagnosis not present

## 2022-03-12 DIAGNOSIS — R531 Weakness: Secondary | ICD-10-CM | POA: Diagnosis not present

## 2022-03-12 DIAGNOSIS — E1129 Type 2 diabetes mellitus with other diabetic kidney complication: Secondary | ICD-10-CM | POA: Diagnosis present

## 2022-03-12 DIAGNOSIS — Z8674 Personal history of sudden cardiac arrest: Secondary | ICD-10-CM

## 2022-03-12 DIAGNOSIS — I132 Hypertensive heart and chronic kidney disease with heart failure and with stage 5 chronic kidney disease, or end stage renal disease: Secondary | ICD-10-CM | POA: Diagnosis present

## 2022-03-12 DIAGNOSIS — Z87891 Personal history of nicotine dependence: Secondary | ICD-10-CM

## 2022-03-12 DIAGNOSIS — Z794 Long term (current) use of insulin: Secondary | ICD-10-CM

## 2022-03-12 DIAGNOSIS — Z800 Family history of malignant neoplasm of digestive organs: Secondary | ICD-10-CM

## 2022-03-12 DIAGNOSIS — E1122 Type 2 diabetes mellitus with diabetic chronic kidney disease: Secondary | ICD-10-CM | POA: Diagnosis present

## 2022-03-12 DIAGNOSIS — I4891 Unspecified atrial fibrillation: Secondary | ICD-10-CM | POA: Diagnosis not present

## 2022-03-12 DIAGNOSIS — E11621 Type 2 diabetes mellitus with foot ulcer: Secondary | ICD-10-CM | POA: Diagnosis present

## 2022-03-12 DIAGNOSIS — L0390 Cellulitis, unspecified: Secondary | ICD-10-CM | POA: Diagnosis not present

## 2022-03-12 DIAGNOSIS — M898X9 Other specified disorders of bone, unspecified site: Secondary | ICD-10-CM | POA: Diagnosis present

## 2022-03-12 DIAGNOSIS — Z955 Presence of coronary angioplasty implant and graft: Secondary | ICD-10-CM

## 2022-03-12 DIAGNOSIS — I509 Heart failure, unspecified: Secondary | ICD-10-CM | POA: Diagnosis present

## 2022-03-12 DIAGNOSIS — I493 Ventricular premature depolarization: Secondary | ICD-10-CM | POA: Diagnosis present

## 2022-03-12 DIAGNOSIS — M79671 Pain in right foot: Secondary | ICD-10-CM | POA: Diagnosis not present

## 2022-03-12 DIAGNOSIS — Z89421 Acquired absence of other right toe(s): Secondary | ICD-10-CM | POA: Diagnosis not present

## 2022-03-12 DIAGNOSIS — Z79899 Other long term (current) drug therapy: Secondary | ICD-10-CM

## 2022-03-12 DIAGNOSIS — G9341 Metabolic encephalopathy: Secondary | ICD-10-CM | POA: Diagnosis present

## 2022-03-12 DIAGNOSIS — I120 Hypertensive chronic kidney disease with stage 5 chronic kidney disease or end stage renal disease: Secondary | ICD-10-CM | POA: Diagnosis not present

## 2022-03-12 HISTORY — DX: End stage renal disease: N18.6

## 2022-03-12 HISTORY — DX: End stage renal disease: Z99.2

## 2022-03-12 LAB — COMPREHENSIVE METABOLIC PANEL
ALT: 11 U/L (ref 0–44)
AST: 15 U/L (ref 15–41)
Albumin: 3.5 g/dL (ref 3.5–5.0)
Alkaline Phosphatase: 98 U/L (ref 38–126)
Anion gap: 15 (ref 5–15)
BUN: 23 mg/dL (ref 8–23)
CO2: 29 mmol/L (ref 22–32)
Calcium: 8 mg/dL — ABNORMAL LOW (ref 8.9–10.3)
Chloride: 94 mmol/L — ABNORMAL LOW (ref 98–111)
Creatinine, Ser: 5.65 mg/dL — ABNORMAL HIGH (ref 0.61–1.24)
GFR, Estimated: 10 mL/min — ABNORMAL LOW (ref >60)
Glucose, Bld: 100 mg/dL — ABNORMAL HIGH (ref 70–99)
Potassium: 3.6 mmol/L (ref 3.5–5.1)
Sodium: 138 mmol/L (ref 135–145)
Total Bilirubin: 1.3 mg/dL — ABNORMAL HIGH (ref 0.3–1.2)
Total Protein: 8.2 g/dL — ABNORMAL HIGH (ref 6.5–8.1)

## 2022-03-12 LAB — CBC WITH DIFFERENTIAL/PLATELET
Abs Immature Granulocytes: 0.04 10*3/uL (ref 0.00–0.07)
Basophils Absolute: 0 10*3/uL (ref 0.0–0.1)
Basophils Relative: 0 %
Eosinophils Absolute: 0 10*3/uL (ref 0.0–0.5)
Eosinophils Relative: 0 %
HCT: 36.7 % — ABNORMAL LOW (ref 39.0–52.0)
Hemoglobin: 12 g/dL — ABNORMAL LOW (ref 13.0–17.0)
Immature Granulocytes: 0 %
Lymphocytes Relative: 5 %
Lymphs Abs: 0.5 10*3/uL — ABNORMAL LOW (ref 0.7–4.0)
MCH: 30.7 pg (ref 26.0–34.0)
MCHC: 32.7 g/dL (ref 30.0–36.0)
MCV: 93.9 fL (ref 80.0–100.0)
Monocytes Absolute: 0.7 10*3/uL (ref 0.1–1.0)
Monocytes Relative: 7 %
Neutro Abs: 9.1 10*3/uL — ABNORMAL HIGH (ref 1.7–7.7)
Neutrophils Relative %: 88 %
Platelets: 190 10*3/uL (ref 150–400)
RBC: 3.91 MIL/uL — ABNORMAL LOW (ref 4.22–5.81)
RDW: 15.3 % (ref 11.5–15.5)
WBC: 10.5 10*3/uL (ref 4.0–10.5)
nRBC: 0 % (ref 0.0–0.2)

## 2022-03-12 LAB — I-STAT CHEM 8, ED
BUN: 22 mg/dL (ref 8–23)
Calcium, Ion: 0.91 mmol/L — ABNORMAL LOW (ref 1.15–1.40)
Chloride: 93 mmol/L — ABNORMAL LOW (ref 98–111)
Creatinine, Ser: 6.3 mg/dL — ABNORMAL HIGH (ref 0.61–1.24)
Glucose, Bld: 98 mg/dL (ref 70–99)
HCT: 39 % (ref 39.0–52.0)
Hemoglobin: 13.3 g/dL (ref 13.0–17.0)
Potassium: 3.6 mmol/L (ref 3.5–5.1)
Sodium: 138 mmol/L (ref 135–145)
TCO2: 31 mmol/L (ref 22–32)

## 2022-03-12 LAB — PROTIME-INR
INR: 1.3 — ABNORMAL HIGH (ref 0.8–1.2)
Prothrombin Time: 16.3 seconds — ABNORMAL HIGH (ref 11.4–15.2)

## 2022-03-12 LAB — CBG MONITORING, ED
Glucose-Capillary: 107 mg/dL — ABNORMAL HIGH (ref 70–99)
Glucose-Capillary: 107 mg/dL — ABNORMAL HIGH (ref 70–99)

## 2022-03-12 LAB — LACTIC ACID, PLASMA: Lactic Acid, Venous: 1.1 mmol/L (ref 0.5–1.9)

## 2022-03-12 LAB — TROPONIN I (HIGH SENSITIVITY): Troponin I (High Sensitivity): 107 ng/L (ref <18)

## 2022-03-12 LAB — APTT: aPTT: 35 seconds (ref 24–36)

## 2022-03-12 MED ORDER — VANCOMYCIN HCL IN DEXTROSE 1-5 GM/200ML-% IV SOLN
1000.0000 mg | Freq: Once | INTRAVENOUS | Status: AC
  Administered 2022-03-13: 1000 mg via INTRAVENOUS
  Filled 2022-03-12: qty 200

## 2022-03-12 MED ORDER — ACETAMINOPHEN 325 MG PO TABS
650.0000 mg | ORAL_TABLET | Freq: Once | ORAL | Status: AC
  Administered 2022-03-12: 650 mg via ORAL
  Filled 2022-03-12: qty 2

## 2022-03-12 MED ORDER — ASPIRIN 325 MG PO TABS
325.0000 mg | ORAL_TABLET | Freq: Once | ORAL | Status: AC
  Administered 2022-03-12: 325 mg via ORAL
  Filled 2022-03-12: qty 1

## 2022-03-12 MED ORDER — CEFEPIME HCL 2 G IV SOLR
2.0000 g | INTRAVENOUS | Status: DC
  Administered 2022-03-12: 2 g via INTRAVENOUS
  Filled 2022-03-12: qty 12.5

## 2022-03-12 NOTE — Progress Notes (Addendum)
Pharmacy Antibiotic Note ? ?William Webb is a 71 y.o. male admitted on 03/12/2022 with  hx of ESRD on HD (last HD was today), here presenting with confusion and fever. .  Pharmacy has been consulted to dose cefepime for bacteremia. ? ?Plan: ?Cefepime 2gm IV q24h ?Follow renal function, cultures and clinical course ? ?Height: '6\' 3"'$  (190.5 cm) ?Weight: 83.9 kg (185 lb) ?IBW/kg (Calculated) : 84.5 ? ?Temp (24hrs), Avg:101.6 ?F (38.7 ?C), Min:101.6 ?F (38.7 ?C), Max:101.6 ?F (38.7 ?C) ? ?Recent Labs  ?Lab 03/12/22 ?2202 03/12/22 ?2216  ?WBC 10.5  --   ?CREATININE 5.65* 6.30*  ?LATICACIDVEN 1.1  --   ?  ?Estimated Creatinine Clearance: 12.6 mL/min (A) (by C-G formula based on SCr of 6.3 mg/dL (H)).   ? ?No Known Allergies ? ?Antimicrobials this admission: ?5/16 cefepime >> ?5/16 vanc x1 ? ?Dose adjustments this admission: ? ? ?Microbiology results: ?5/15 BCx:  ?5/15 UCx:  ? ?Thank you for allowing pharmacy to be a part of this patient?s care. ? ?Dolly Rias RPh ?03/12/2022, 11:01 PM ? ?Pharmacy consulted to dose vancomycin for osteomyelitis ? ?Pt received vancomycin 1gm IV x 1 in ED ?Due to renal function would get level in ~ 48 hours prior to dosing again ? ?Dolly Rias RPh ?03/13/2022, 3:42 AM ? ? ?

## 2022-03-12 NOTE — ED Provider Triage Note (Signed)
Emergency Medicine Provider Triage Evaluation Note ? ?William Webb [place] , a 71 y.o. male  was evaluated in triage.  Pt complains of altered mental status.  Patient is here with his niece who provides history.  Patient went to dialysis today from 12 until 430, patient's family was unable to locate him after his dialysis until about 30 minutes prior to arrival.  Patient attends dialysis in [place], was located sitting outside a restaurant in his car where bystanders stated that he had been for about an hour, and the vehicle unresponsive.  Patient was found to be clammy.  States that he does have black stools for the past week and is on Eliquis for his heart.  Patient is found to be febrile at triage with temp of 101.6.  Also states on Friday he had to be resuscitated at dialysis, states a big girl did compressions on him and he refused transport to the ER that day. ? ?Review of Systems  ?Positive: Black stools, altered mental status ?Negative: Cough, CP, SHOB ? ?Physical Exam  ?BP (!) 185/84 (BP Location: Right Arm)   Pulse 91   Temp (!) 101.6 ?F (38.7 ?C) (Oral)   Resp 18   Ht '6\' 3"'$  (1.905 m)   Wt 83.9 kg   SpO2 93%   BMI 23.12 kg/m?  ?Gen:   Awake, no distress   ?Resp:  Normal effort  ?MSK:   Moves extremities without difficulty, no numbness  ?Other:   ? ?Medical Decision Making  ?Medically screening exam initiated at 9:33 PM.  Appropriate orders placed.  Franko Hilliker [facility] was informed that the remainder of the evaluation will be completed by another provider, this initial triage assessment does not replace that evaluation, and the importance of remaining in the ED until their evaluation is complete. ? ? ?  ?Tacy Learn, PA-C ?03/12/22 2133 ? ?

## 2022-03-12 NOTE — ED Triage Notes (Signed)
Pt had dialysis today around 430 pm, pt has been missing from his family. Pt was found far from his home in a parking lot sweating and confused. Pt states that he had to get compressions at dialysis on Friday but refused to go to the hospital.  ?

## 2022-03-12 NOTE — ED Notes (Signed)
TO CT

## 2022-03-12 NOTE — ED Provider Notes (Signed)
?[facility] DEPT ?Provider Note ? ? ?CSN: [id number] ?Arrival date & time: 03/12/22  2109 ? ?  ? ?History ? ?Chief Complaint  ?Patient presents with  ? Altered Mental Status  ? Fever  ? Weakness  ? ? ?William Webb is a 71 y.o. male hx of ESRD on HD (last HD was today), here presenting with confusion and fever.  Patient went to dialysis 2 days ago and apparently passed out and the nurse there did chest compressions.  He refused transport at that time.  He was not feeling great all weekend and has been in bed.  He did go to dialysis and finished dialysis and was driving home.  He then was very confused and ended up in the restaurant on the other side of town.  Family found him and made him come to the ER. ? ?The history is provided by the patient.  ? ?  ? ?Home Medications ?Prior to Admission medications   ?Medication Sig Start Date End Date Taking? Authorizing Provider  ?acetaminophen (TYLENOL) 650 MG CR tablet Take 1,300 mg by mouth at bedtime.    [provider]  ?Alcohol Swabs (B-D SINGLE USE SWABS REGULAR) PADS  04/22/20   [provider]  ?apixaban (ELIQUIS) 5 MG TABS tablet Take 1 tablet (5 mg total) by mouth 2 (two) times daily. 02/22/22   Patwardhan, Reynold Bowen, MD  ?B Complex-C-Folic Acid (DIALYVITE 169) 0.8 MG TABS Take 1 tablet by mouth every evening.    [provider]  ?Blood Glucose Monitoring Suppl (ACCU-CHEK GUIDE ME) w/Device KIT 1 Device by Does not apply route 4 (four) times daily -  before meals and at bedtime. 11/19/18   Billie Ruddy, MD  ?Calcium Carbonate-Simethicone (ALKA-SELTZER HEARTBURN + GAS) 750-80 MG CHEW Chew 1 tablet by mouth 3 (three) times daily as needed (indigestion/heartburn.).    [provider]  ?carvedilol (COREG) 25 MG tablet Take 25 mg by mouth 2 (two) times daily. 09/04/21   [provider]  ?carvedilol (COREG) 6.25 MG tablet Take 1 tablet (6.25 mg total) by mouth 2 (two) times daily. ?Patient  not taking: Reported on 12/04/2021 02/24/21   Alethia Berthold, PA-C  ?Continuous Blood Gluc Sensor (DEXCOM G6 SENSOR) MISC 1 Device by Does not apply route See admin instructions. Change every 10 days 01/31/21   Renato Shin, MD  ?Continuous Blood Gluc Sensor (DEXCOM G6 SENSOR) MISC 1 Device by Does not apply route See admin instructions. Change every 10 days 02/02/21   Renato Shin, MD  ?Cyanocobalamin (VITAMIN B-12) 5000 MCG TBDP Take 5,000 mcg by mouth in the morning.    [provider]  ?Dextromethorphan-guaiFENesin (CORICIDIN HBP CONGESTION/COUGH) 10-200 MG CAPS Take 2 capsules by mouth daily as needed (cough/congestion).    [provider]  ?doxycycline (VIBRA-TABS) 100 MG tablet Take 1 tablet (100 mg total) by mouth 2 (two) times daily. 03/01/22   Wallene Huh, DPM  ?furosemide (LASIX) 80 MG tablet Take 80 mg by mouth See admin instructions. Take 80 mg by mouth twice a day and additional 80 mg before bedtime as needed for swelling of the ankles    [provider]  ?gabapentin (NEURONTIN) 600 MG tablet Take 0.5 tablets (300 mg total) by mouth 2 (two) times daily. ?Patient taking differently: Take 600 mg by mouth 2 (two) times daily. 08/02/20   Debbe Odea, MD  ?gabapentin (NEURONTIN) 600 MG tablet Take 300 mg by mouth in the morning, at noon, and at  bedtime.    [provider]  ?glucose 4 GM chewable tablet Chew 1 tablet by mouth as needed for low blood sugar.    [provider]  ?hydrALAZINE (APRESOLINE) 50 MG tablet Take 50 mg by mouth 2 (two) times daily. 11/10/21   [provider]  ?isosorbide mononitrate (IMDUR) 60 MG 24 hr tablet Take 1 tablet (60 mg total) by mouth daily. ?Patient taking differently: Take 60 mg by mouth at bedtime. 08/03/20   Debbe Odea, MD  ?ketorolac (ACULAR) 0.5 % ophthalmic solution Place 1 drop into both eyes daily as needed (inflamation). 11/22/20   [provider]  ?Lidocaine 4 % AERO Apply 1 spray topically as  needed (prior to port being accessed.).    [provider]  ?lidocaine-prilocaine (EMLA) cream 1 application as needed (prior to port being accessed). 07/05/21   [provider]  ?linaclotide Rolan Lipa) 145 MCG CAPS capsule Take 1 capsule (145 mcg total) by mouth daily as needed (constipation). 12/04/18   Geradine Girt, DO  ?methocarbamol (ROBAXIN) 500 MG tablet Take 500 mg by mouth daily as needed for muscle spasms.    [provider]  ?montelukast (SINGULAIR) 10 MG tablet TAKE 1 TABLET BY MOUTH EVERYDAY AT BEDTIME 02/13/21   Billie Ruddy, MD  ?NOVOLOG FLEXPEN 100 UNIT/ML FlexPen INJECT 3 UNITS INTO THE SKIN 3 (THREE) TIMES DAILY WITH MEALS. ?Patient taking differently: Inject 3-5 Units into the skin 3 (three) times daily as needed for high blood sugar. 08/31/20   Renato Shin, MD  ?ofloxacin (OCUFLOX) 0.3 % ophthalmic solution Place 1 drop into both eyes daily as needed (irritation.).    [provider]  ?omeprazole (PRILOSEC) 20 MG capsule Take 20 mg by mouth in the morning.    [provider]  ?ondansetron (ZOFRAN) 4 MG tablet Take 1 tablet (4 mg total) by mouth every 8 (eight) hours as needed for nausea. 08/02/20   Debbe Odea, MD  ?prednisoLONE acetate (PRED FORTE) 1 % ophthalmic suspension Place 1 drop into both eyes daily as needed (inflammation). 11/22/20   [provider]  ?rosuvastatin (CRESTOR) 20 MG tablet TAKE 1 TABLET (20 MG TOTAL) BY MOUTH DAILY AT 6 PM. ?Patient taking differently: Take 20 mg by mouth at bedtime. 07/13/19   Billie Ruddy, MD  ?sevelamer carbonate (RENVELA) 800 MG tablet Take 800 mg by mouth with breakfast, with lunch, and with evening meal. 01/10/21   [provider]  ?sildenafil (VIAGRA) 100 MG tablet Take 1 tablet (100 mg total) by mouth daily as needed for erectile dysfunction. 01/31/21   Renato Shin, MD  ?silodosin (RAPAFLO) 8 MG CAPS capsule Take 8 mg by mouth in the morning. 09/14/21   [provider]   ?tamsulosin (FLOMAX) 0.4 MG CAPS capsule Take 0.4 mg by mouth at bedtime. 11/16/19   [provider]  ?traMADol (ULTRAM) 50 MG tablet Take 1 tablet (50 mg total) by mouth every 6 (six) hours as needed. 12/05/21 12/05/22  [name], MD  ?traMADol (ULTRAM) 50 MG tablet Take 1 tablet (50 mg total) by mouth every 6 (six) hours as needed. 12/05/21 12/05/22  [name], MD  ?vitamin E (VITAMIN E) 1000 UNIT capsule Take 1,000 Units by mouth daily.     [provider]  ?   ? ?Allergies    ?Patient has no known allergies.   ? ?Review of Systems   ?Review of Systems  ?All other systems reviewed and are negative. ? ?Physical Exam ?Updated  Vital Signs ?BP (!) 200/94   Pulse 94   Temp (!) 101.6 ?F (38.7 ?C) (Oral)   Resp 20   Ht 6' 3"  (1.905 m)   Wt 83.9 kg   SpO2 100%   BMI 23.12 kg/m?  ?Physical Exam ?Vitals and nursing note reviewed.  ?Constitutional:   ?   Comments: Slightly confused and chronically ill  ?HENT:  ?   Head: Normocephalic.  ?   Nose: Nose normal.  ?   Mouth/Throat:  ?   Mouth: Mucous membranes are moist.  ?Eyes:  ?   Extraocular Movements: Extraocular movements intact.  ?   Pupils: Pupils are equal, round, and reactive to light.  ?Neck:  ?   Comments: No meningeal sign  ?Cardiovascular:  ?   Rate and Rhythm: Normal rate and regular rhythm.  ?   Pulses: Normal pulses.  ?   Heart sounds: Normal heart sounds.  ?Pulmonary:  ?   Effort: Pulmonary effort is normal.  ?   Breath sounds: Normal breath sounds.  ?Abdominal:  ?   General: Abdomen is flat.  ?   Palpations: Abdomen is soft.  ?Musculoskeletal:     ?   General: Normal range of motion.  ?   Cervical back: Normal range of motion and neck supple.  ?Skin: ?   General: Skin is warm.  ?   Capillary Refill: Capillary refill takes less than 2 seconds.  ?Neurological:  ?   Mental Status: He is alert.  ?   Comments: ANO x3.  Patient is moving all extremities normal strength bilateral  ?Psychiatric:     ?   Mood and Affect: Mood normal.      ?   Behavior: Behavior normal.  ? ? ?ED Results / Procedures / Treatments   ?Labs ?(all labs ordered are listed, but only abnormal results are displayed) ?Labs Reviewed  ?COMPREHENSIVE METABOLIC PANEL - Abnormal;

## 2022-03-13 ENCOUNTER — Inpatient Hospital Stay (HOSPITAL_COMMUNITY): Payer: Medicare HMO

## 2022-03-13 ENCOUNTER — Other Ambulatory Visit: Payer: Self-pay

## 2022-03-13 ENCOUNTER — Encounter (HOSPITAL_COMMUNITY): Payer: Self-pay | Admitting: Internal Medicine

## 2022-03-13 DIAGNOSIS — I96 Gangrene, not elsewhere classified: Secondary | ICD-10-CM | POA: Diagnosis not present

## 2022-03-13 DIAGNOSIS — N250 Renal osteodystrophy: Secondary | ICD-10-CM | POA: Diagnosis not present

## 2022-03-13 DIAGNOSIS — Z20822 Contact with and (suspected) exposure to covid-19: Secondary | ICD-10-CM | POA: Diagnosis not present

## 2022-03-13 DIAGNOSIS — I70221 Atherosclerosis of native arteries of extremities with rest pain, right leg: Secondary | ICD-10-CM | POA: Diagnosis present

## 2022-03-13 DIAGNOSIS — Z992 Dependence on renal dialysis: Secondary | ICD-10-CM | POA: Diagnosis not present

## 2022-03-13 DIAGNOSIS — Z89421 Acquired absence of other right toe(s): Secondary | ICD-10-CM | POA: Diagnosis not present

## 2022-03-13 DIAGNOSIS — I739 Peripheral vascular disease, unspecified: Secondary | ICD-10-CM | POA: Diagnosis not present

## 2022-03-13 DIAGNOSIS — I120 Hypertensive chronic kidney disease with stage 5 chronic kidney disease or end stage renal disease: Secondary | ICD-10-CM | POA: Diagnosis not present

## 2022-03-13 DIAGNOSIS — M869 Osteomyelitis, unspecified: Secondary | ICD-10-CM | POA: Diagnosis present

## 2022-03-13 DIAGNOSIS — Z8674 Personal history of sudden cardiac arrest: Secondary | ICD-10-CM | POA: Diagnosis not present

## 2022-03-13 DIAGNOSIS — D631 Anemia in chronic kidney disease: Secondary | ICD-10-CM | POA: Diagnosis not present

## 2022-03-13 DIAGNOSIS — R778 Other specified abnormalities of plasma proteins: Secondary | ICD-10-CM

## 2022-03-13 DIAGNOSIS — L0390 Cellulitis, unspecified: Secondary | ICD-10-CM | POA: Diagnosis not present

## 2022-03-13 DIAGNOSIS — Z23 Encounter for immunization: Secondary | ICD-10-CM | POA: Diagnosis not present

## 2022-03-13 DIAGNOSIS — N186 End stage renal disease: Secondary | ICD-10-CM | POA: Diagnosis not present

## 2022-03-13 DIAGNOSIS — M86671 Other chronic osteomyelitis, right ankle and foot: Secondary | ICD-10-CM | POA: Diagnosis not present

## 2022-03-13 DIAGNOSIS — I4891 Unspecified atrial fibrillation: Secondary | ICD-10-CM | POA: Diagnosis not present

## 2022-03-13 DIAGNOSIS — E1151 Type 2 diabetes mellitus with diabetic peripheral angiopathy without gangrene: Secondary | ICD-10-CM | POA: Diagnosis not present

## 2022-03-13 DIAGNOSIS — R6510 Systemic inflammatory response syndrome (SIRS) of non-infectious origin without acute organ dysfunction: Secondary | ICD-10-CM | POA: Diagnosis not present

## 2022-03-13 DIAGNOSIS — Z7902 Long term (current) use of antithrombotics/antiplatelets: Secondary | ICD-10-CM | POA: Diagnosis not present

## 2022-03-13 DIAGNOSIS — E1169 Type 2 diabetes mellitus with other specified complication: Secondary | ICD-10-CM | POA: Diagnosis present

## 2022-03-13 DIAGNOSIS — E1122 Type 2 diabetes mellitus with diabetic chronic kidney disease: Secondary | ICD-10-CM | POA: Diagnosis present

## 2022-03-13 DIAGNOSIS — I160 Hypertensive urgency: Secondary | ICD-10-CM | POA: Diagnosis present

## 2022-03-13 DIAGNOSIS — G9341 Metabolic encephalopathy: Secondary | ICD-10-CM | POA: Diagnosis not present

## 2022-03-13 DIAGNOSIS — E11621 Type 2 diabetes mellitus with foot ulcer: Secondary | ICD-10-CM | POA: Diagnosis present

## 2022-03-13 DIAGNOSIS — E11319 Type 2 diabetes mellitus with unspecified diabetic retinopathy without macular edema: Secondary | ICD-10-CM | POA: Diagnosis present

## 2022-03-13 DIAGNOSIS — I132 Hypertensive heart and chronic kidney disease with heart failure and with stage 5 chronic kidney disease, or end stage renal disease: Secondary | ICD-10-CM | POA: Diagnosis not present

## 2022-03-13 DIAGNOSIS — E1152 Type 2 diabetes mellitus with diabetic peripheral angiopathy with gangrene: Secondary | ICD-10-CM | POA: Diagnosis present

## 2022-03-13 DIAGNOSIS — M19071 Primary osteoarthritis, right ankle and foot: Secondary | ICD-10-CM | POA: Diagnosis not present

## 2022-03-13 DIAGNOSIS — I2510 Atherosclerotic heart disease of native coronary artery without angina pectoris: Secondary | ICD-10-CM | POA: Diagnosis present

## 2022-03-13 DIAGNOSIS — L97519 Non-pressure chronic ulcer of other part of right foot with unspecified severity: Secondary | ICD-10-CM | POA: Diagnosis not present

## 2022-03-13 DIAGNOSIS — E1142 Type 2 diabetes mellitus with diabetic polyneuropathy: Secondary | ICD-10-CM | POA: Diagnosis present

## 2022-03-13 DIAGNOSIS — I272 Pulmonary hypertension, unspecified: Secondary | ICD-10-CM | POA: Diagnosis not present

## 2022-03-13 DIAGNOSIS — I4819 Other persistent atrial fibrillation: Secondary | ICD-10-CM | POA: Diagnosis present

## 2022-03-13 DIAGNOSIS — M86171 Other acute osteomyelitis, right ankle and foot: Secondary | ICD-10-CM | POA: Diagnosis not present

## 2022-03-13 DIAGNOSIS — M898X9 Other specified disorders of bone, unspecified site: Secondary | ICD-10-CM | POA: Diagnosis present

## 2022-03-13 DIAGNOSIS — M868X7 Other osteomyelitis, ankle and foot: Secondary | ICD-10-CM | POA: Diagnosis not present

## 2022-03-13 LAB — CBC
HCT: 34.5 % — ABNORMAL LOW (ref 39.0–52.0)
Hemoglobin: 11.3 g/dL — ABNORMAL LOW (ref 13.0–17.0)
MCH: 30.5 pg (ref 26.0–34.0)
MCHC: 32.8 g/dL (ref 30.0–36.0)
MCV: 93.2 fL (ref 80.0–100.0)
Platelets: 168 10*3/uL (ref 150–400)
RBC: 3.7 MIL/uL — ABNORMAL LOW (ref 4.22–5.81)
RDW: 15.2 % (ref 11.5–15.5)
WBC: 8.6 10*3/uL (ref 4.0–10.5)
nRBC: 0 % (ref 0.0–0.2)

## 2022-03-13 LAB — COMPREHENSIVE METABOLIC PANEL
ALT: 10 U/L (ref 0–44)
AST: 18 U/L (ref 15–41)
Albumin: 3.1 g/dL — ABNORMAL LOW (ref 3.5–5.0)
Alkaline Phosphatase: 83 U/L (ref 38–126)
Anion gap: 11 (ref 5–15)
BUN: 28 mg/dL — ABNORMAL HIGH (ref 8–23)
CO2: 31 mmol/L (ref 22–32)
Calcium: 7.6 mg/dL — ABNORMAL LOW (ref 8.9–10.3)
Chloride: 94 mmol/L — ABNORMAL LOW (ref 98–111)
Creatinine, Ser: 6.43 mg/dL — ABNORMAL HIGH (ref 0.61–1.24)
GFR, Estimated: 9 mL/min — ABNORMAL LOW (ref >60)
Glucose, Bld: 182 mg/dL — ABNORMAL HIGH (ref 70–99)
Potassium: 3.4 mmol/L — ABNORMAL LOW (ref 3.5–5.1)
Sodium: 136 mmol/L (ref 135–145)
Total Bilirubin: 0.8 mg/dL (ref 0.3–1.2)
Total Protein: 7.3 g/dL (ref 6.5–8.1)

## 2022-03-13 LAB — RESP PANEL BY RT-PCR (FLU A&B, COVID) ARPGX2
Influenza A by PCR: NEGATIVE
Influenza B by PCR: NEGATIVE
SARS Coronavirus 2 by RT PCR: NEGATIVE

## 2022-03-13 LAB — CBG MONITORING, ED
Glucose-Capillary: 139 mg/dL — ABNORMAL HIGH (ref 70–99)
Glucose-Capillary: 197 mg/dL — ABNORMAL HIGH (ref 70–99)
Glucose-Capillary: 144 mg/dL — ABNORMAL HIGH (ref 70–99)
Glucose-Capillary: 113 mg/dL — ABNORMAL HIGH (ref 70–99)
Glucose-Capillary: 102 mg/dL — ABNORMAL HIGH (ref 70–99)

## 2022-03-13 LAB — PHOSPHORUS: Phosphorus: 4 mg/dL (ref 2.5–4.6)

## 2022-03-13 LAB — TROPONIN I (HIGH SENSITIVITY): Troponin I (High Sensitivity): 117 ng/L (ref <18)

## 2022-03-13 LAB — APTT: aPTT: 60 seconds — ABNORMAL HIGH (ref 24–36)

## 2022-03-13 LAB — HEPARIN LEVEL (UNFRACTIONATED): Heparin Unfractionated: >1.1 IU/mL — ABNORMAL HIGH (ref 0.30–0.70)

## 2022-03-13 MED ORDER — CARVEDILOL 12.5 MG PO TABS
12.5000 mg | ORAL_TABLET | Freq: Two times a day (BID) | ORAL | Status: DC
  Administered 2022-03-13 – 2022-03-20 (×15): 12.5 mg via ORAL
  Filled 2022-03-13 (×16): qty 1

## 2022-03-13 MED ORDER — SEVELAMER CARBONATE 800 MG PO TABS
800.0000 mg | ORAL_TABLET | Freq: Three times a day (TID) | ORAL | Status: DC
  Administered 2022-03-13 – 2022-03-20 (×15): 800 mg via ORAL
  Filled 2022-03-13 (×19): qty 1

## 2022-03-13 MED ORDER — CEFEPIME HCL 1 G IJ SOLR
1.0000 g | INTRAMUSCULAR | Status: DC
  Administered 2022-03-14 – 2022-03-18 (×6): 1 g via INTRAVENOUS
  Filled 2022-03-13 (×8): qty 10

## 2022-03-13 MED ORDER — HEPARIN SODIUM (PORCINE) 1000 UNIT/ML DIALYSIS
2500.0000 [IU] | INTRAMUSCULAR | Status: DC | PRN
  Filled 2022-03-13: qty 3

## 2022-03-13 MED ORDER — ACETAMINOPHEN 325 MG PO TABS
650.0000 mg | ORAL_TABLET | Freq: Four times a day (QID) | ORAL | Status: DC | PRN
  Administered 2022-03-13 – 2022-03-14 (×2): 650 mg via ORAL
  Filled 2022-03-13 (×2): qty 2

## 2022-03-13 MED ORDER — ISOSORBIDE MONONITRATE ER 60 MG PO TB24
60.0000 mg | ORAL_TABLET | Freq: Every day | ORAL | Status: DC
  Administered 2022-03-13 – 2022-03-19 (×7): 60 mg via ORAL
  Filled 2022-03-13 (×8): qty 1

## 2022-03-13 MED ORDER — VITAMIN B-12 1000 MCG PO TABS
5000.0000 ug | ORAL_TABLET | Freq: Every day | ORAL | Status: DC
  Administered 2022-03-14 – 2022-03-20 (×6): 5000 ug via ORAL
  Filled 2022-03-13 (×8): qty 5

## 2022-03-13 MED ORDER — HEPARIN (PORCINE) 25000 UT/250ML-% IV SOLN
1250.0000 [IU]/h | INTRAVENOUS | Status: DC
Start: 2022-03-13 — End: 2022-03-14
  Administered 2022-03-13: 1100 [IU]/h via INTRAVENOUS
  Administered 2022-03-14: 1250 [IU]/h via INTRAVENOUS
  Filled 2022-03-13 (×2): qty 250

## 2022-03-13 MED ORDER — CHLORHEXIDINE GLUCONATE CLOTH 2 % EX PADS
6.0000 | MEDICATED_PAD | Freq: Every day | CUTANEOUS | Status: DC
  Administered 2022-03-14 – 2022-03-18 (×5): 6 via TOPICAL

## 2022-03-13 MED ORDER — LABETALOL HCL 5 MG/ML IV SOLN
10.0000 mg | Freq: Once | INTRAVENOUS | Status: AC
  Administered 2022-03-13: 10 mg via INTRAVENOUS
  Filled 2022-03-13: qty 4

## 2022-03-13 MED ORDER — ACETAMINOPHEN 650 MG RE SUPP: 650.0000 mg | Freq: Four times a day (QID) | RECTAL | Status: DC | PRN

## 2022-03-13 MED ORDER — DOXERCALCIFEROL 4 MCG/2ML IV SOLN
3.0000 ug | INTRAVENOUS | Status: DC
  Administered 2022-03-14 – 2022-03-19 (×3): 3 ug via INTRAVENOUS
  Filled 2022-03-13 (×3): qty 2

## 2022-03-13 MED ORDER — SUCROFERRIC OXYHYDROXIDE 500 MG PO CHEW
500.0000 mg | CHEWABLE_TABLET | Freq: Three times a day (TID) | ORAL | Status: DC
  Administered 2022-03-17 – 2022-03-19 (×6): 500 mg via ORAL
  Filled 2022-03-13 (×23): qty 1

## 2022-03-13 MED ORDER — LABETALOL HCL 5 MG/ML IV SOLN
10.0000 mg | INTRAVENOUS | Status: DC | PRN
Start: 2022-03-13 — End: 2022-03-14
  Administered 2022-03-13: 10 mg via INTRAVENOUS
  Filled 2022-03-13: qty 4

## 2022-03-13 MED ORDER — TAMSULOSIN HCL 0.4 MG PO CAPS
0.4000 mg | ORAL_CAPSULE | Freq: Every day | ORAL | Status: DC
  Administered 2022-03-13 – 2022-03-19 (×7): 0.4 mg via ORAL
  Filled 2022-03-13 (×7): qty 1

## 2022-03-13 MED ORDER — VANCOMYCIN HCL IN DEXTROSE 1-5 GM/200ML-% IV SOLN
1000.0000 mg | INTRAVENOUS | Status: DC
  Administered 2022-03-14 – 2022-03-17 (×2): 1000 mg via INTRAVENOUS
  Filled 2022-03-13 (×5): qty 200

## 2022-03-13 MED ORDER — HYDRALAZINE HCL 50 MG PO TABS
50.0000 mg | ORAL_TABLET | Freq: Two times a day (BID) | ORAL | Status: DC
  Administered 2022-03-13 – 2022-03-17 (×8): 50 mg via ORAL
  Filled 2022-03-13 (×3): qty 1
  Filled 2022-03-13: qty 2
  Filled 2022-03-13 (×3): qty 1
  Filled 2022-03-13: qty 2
  Filled 2022-03-13 (×3): qty 1

## 2022-03-13 MED ORDER — FUROSEMIDE 40 MG PO TABS
80.0000 mg | ORAL_TABLET | Freq: Two times a day (BID) | ORAL | Status: DC
  Administered 2022-03-13 – 2022-03-20 (×12): 80 mg via ORAL
  Filled 2022-03-13 (×16): qty 2

## 2022-03-13 MED ORDER — VANCOMYCIN VARIABLE DOSE PER UNSTABLE RENAL FUNCTION (PHARMACIST DOSING): Status: DC

## 2022-03-13 MED ORDER — INSULIN ASPART 100 UNIT/ML IJ SOLN
0.0000 [IU] | Freq: Three times a day (TID) | INTRAMUSCULAR | Status: DC
  Filled 2022-03-13: qty 0.06

## 2022-03-13 MED ORDER — HEPARIN BOLUS VIA INFUSION
1200.0000 [IU] | Freq: Once | INTRAVENOUS | Status: AC
  Administered 2022-03-13: 1200 [IU] via INTRAVENOUS
  Filled 2022-03-13: qty 1200

## 2022-03-13 MED ORDER — GABAPENTIN 300 MG PO CAPS
300.0000 mg | ORAL_CAPSULE | Freq: Two times a day (BID) | ORAL | Status: DC
  Administered 2022-03-13 – 2022-03-20 (×15): 300 mg via ORAL
  Filled 2022-03-13 (×16): qty 1

## 2022-03-13 NOTE — Progress Notes (Signed)
Pharmacy Antibiotic Note ? ?William Webb is a 71 y.o. male admitted on 03/12/2022 with  hx of ESRD on HD (last HD was today), here presenting with confusion and fever. Pharmacy has been consulted to dose cefepime and vancomycin. Imaging concerning for osteomyelitis of the second toe of right foot. ? ?Plan: ?Change cefepime 2 g to 1 g IV q24h ? ?Start vancomycin 1 g IV MWF with dialysis ? ?Pharmacy to continue to follow cultures, clinical progress and dialysis schedule for antibiotic dosage adjustments or de-escalation as indicated. ? ?Height: '6\' 3"'$  (190.5 cm) ?Weight: 83.9 kg (185 lb) ?IBW/kg (Calculated) : 84.5 ? ?Temp (24hrs), Avg:99.9 ?F (37.7 ?C), Min:98.6 ?F (37 ?C), Max:101.6 ?F (38.7 ?C) ? ?Recent Labs  ?Lab 03/12/22 ?2202 03/12/22 ?2216 03/13/22 ?0336  ?WBC 10.5  --  8.6  ?CREATININE 5.65* 6.30* 6.43*  ?LATICACIDVEN 1.1  --   --   ? ?  ?Estimated Creatinine Clearance: 12.3 mL/min (A) (by C-G formula based on SCr of 6.43 mg/dL (H)).   ? ?No Known Allergies ? ?Antimicrobials this admission: ?5/15 cefepime >> ?5/16 vancomycin >> ? ?Dose adjustments this admission: ?5/16 cefepime 2 g > 1 g q24h ? ?Microbiology results: ?5/15 BCx: ngtd ? ?Thank you for allowing pharmacy to be a part of this patient?s care. ? ?Tawnya Crook, PharmD, BCPS ?Clinical Pharmacist ?03/13/2022 9:46 AM ? ? ? ? ?
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
## 2022-03-13 NOTE — ED Notes (Signed)
Transport called in to [facility] at this time.  ?

## 2022-03-13 NOTE — Progress Notes (Signed)
ABI's have been completed. ?Preliminary results can be found in CV Proc through chart review.  ? ?03/13/22 10:57 AM ?William Webb RVT   ?
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
## 2022-03-13 NOTE — ED Notes (Signed)
MD is aware of blood pressure. Medications given. BP cuff changed.  ?
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
## 2022-03-13 NOTE — Plan of Care (Signed)

## 2022-03-13 NOTE — ED Notes (Signed)
Pt ambulated to bathroom. Reported that he tried to have a bowel movement but was not successful. ?

## 2022-03-13 NOTE — ED Notes (Signed)
[facility] at bedside 

## 2022-03-13 NOTE — Progress Notes (Signed)
Received patient from [facility] via EMS.  Patient is able to stand up and walk to the bed with minimal assistance.  Heparin drip running at 12.5 ml/hr.  Alert and oriented x 4.  Denies pain at this time.  Noted right 2nd toe with gauze with old serous sanguinous drainage.  Also noted a darkened/callous area on left middle heel, measured 2cm x 2cm, intact, patient stated he is unaware of that spot.  Oriented to room and unit routine, call bell within reach.  Needs addressed. ?

## 2022-03-14 ENCOUNTER — Encounter (HOSPITAL_COMMUNITY)
Admission: EM | Disposition: A | Discharge status: Home Health | Payer: Self-pay | Source: Home / Self Care | Attending: Internal Medicine

## 2022-03-14 DIAGNOSIS — M86171 Other acute osteomyelitis, right ankle and foot: Secondary | ICD-10-CM

## 2022-03-14 DIAGNOSIS — I70221 Atherosclerosis of native arteries of extremities with rest pain, right leg: Secondary | ICD-10-CM

## 2022-03-14 HISTORY — PX: PERIPHERAL VASCULAR ATHERECTOMY: CATH118256

## 2022-03-14 HISTORY — PX: ABDOMINAL AORTOGRAM W/LOWER EXTREMITY: CATH118223

## 2022-03-14 LAB — BASIC METABOLIC PANEL
Anion gap: 12 (ref 5–15)
BUN: 38 mg/dL — ABNORMAL HIGH (ref 8–23)
CO2: 28 mmol/L (ref 22–32)
Calcium: 8 mg/dL — ABNORMAL LOW (ref 8.9–10.3)
Chloride: 94 mmol/L — ABNORMAL LOW (ref 98–111)
Creatinine, Ser: 8.11 mg/dL — ABNORMAL HIGH (ref 0.61–1.24)
GFR, Estimated: 6 mL/min — ABNORMAL LOW (ref >60)
Glucose, Bld: 115 mg/dL — ABNORMAL HIGH (ref 70–99)
Potassium: 3.8 mmol/L (ref 3.5–5.1)
Sodium: 134 mmol/L — ABNORMAL LOW (ref 135–145)

## 2022-03-14 LAB — HEPATITIS B SURFACE ANTIGEN: Hepatitis B Surface Ag: NONREACTIVE

## 2022-03-14 LAB — POCT ACTIVATED CLOTTING TIME
Activated Clotting Time: 287 seconds
Activated Clotting Time: 287 seconds
Activated Clotting Time: 299 seconds
Activated Clotting Time: 179 seconds

## 2022-03-14 LAB — CBC
HCT: 29.8 % — ABNORMAL LOW (ref 39.0–52.0)
Hemoglobin: 9.7 g/dL — ABNORMAL LOW (ref 13.0–17.0)
MCH: 30 pg (ref 26.0–34.0)
MCHC: 32.6 g/dL (ref 30.0–36.0)
MCV: 92.3 fL (ref 80.0–100.0)
Platelets: 175 10*3/uL (ref 150–400)
RBC: 3.23 MIL/uL — ABNORMAL LOW (ref 4.22–5.81)
RDW: 15 % (ref 11.5–15.5)
WBC: 6.3 10*3/uL (ref 4.0–10.5)
nRBC: 0 % (ref 0.0–0.2)

## 2022-03-14 LAB — GLUCOSE, CAPILLARY
Glucose-Capillary: 111 mg/dL — ABNORMAL HIGH (ref 70–99)
Glucose-Capillary: 112 mg/dL — ABNORMAL HIGH (ref 70–99)
Glucose-Capillary: 103 mg/dL — ABNORMAL HIGH (ref 70–99)

## 2022-03-14 LAB — HEPATITIS B SURFACE ANTIBODY,QUALITATIVE: Hep B S Ab: REACTIVE — AB

## 2022-03-14 LAB — HEPARIN LEVEL (UNFRACTIONATED): Heparin Unfractionated: 0.64 IU/mL (ref 0.30–0.70)

## 2022-03-14 LAB — MRSA NEXT GEN BY PCR, NASAL: MRSA by PCR Next Gen: NOT DETECTED

## 2022-03-14 LAB — APTT: aPTT: 71 seconds — ABNORMAL HIGH (ref 24–36)

## 2022-03-14 SURGERY — ABDOMINAL AORTOGRAM W/LOWER EXTREMITY
Anesthesia: LOCAL

## 2022-03-14 MED ORDER — VIPERSLIDE LUBRICANT OPTIME: TOPICAL | Status: DC | PRN

## 2022-03-14 MED ORDER — LIDOCAINE HCL (PF) 1 % IJ SOLN
INTRAMUSCULAR | Status: DC | PRN
  Administered 2022-03-14: 20 mL

## 2022-03-14 MED ORDER — CLOPIDOGREL BISULFATE 75 MG PO TABS
75.0000 mg | ORAL_TABLET | Freq: Every day | ORAL | Status: DC
  Administered 2022-03-15 – 2022-03-20 (×5): 75 mg via ORAL
  Filled 2022-03-14 (×5): qty 1

## 2022-03-14 MED ORDER — HYDRALAZINE HCL 20 MG/ML IJ SOLN
INTRAMUSCULAR | Status: DC | PRN
  Administered 2022-03-14 (×3): 10 mg via INTRAVENOUS

## 2022-03-14 MED ORDER — FENTANYL CITRATE (PF) 100 MCG/2ML IJ SOLN
INTRAMUSCULAR | Status: DC | PRN
Start: 2022-03-14 — End: 2022-03-14
  Administered 2022-03-14 (×3): 25 ug via INTRAVENOUS

## 2022-03-14 MED ORDER — SODIUM CHLORIDE 0.9% FLUSH: 3.0000 mL | INTRAVENOUS | Status: DC | PRN

## 2022-03-14 MED ORDER — LIDOCAINE HCL (PF) 1 % IJ SOLN
INTRAMUSCULAR | Status: AC
  Filled 2022-03-14: qty 30

## 2022-03-14 MED ORDER — HYDRALAZINE HCL 20 MG/ML IJ SOLN
INTRAMUSCULAR | Status: AC
  Filled 2022-03-14: qty 1

## 2022-03-14 MED ORDER — LABETALOL HCL 5 MG/ML IV SOLN
10.0000 mg | INTRAVENOUS | Status: AC | PRN
  Administered 2022-03-16 – 2022-03-18 (×4): 10 mg via INTRAVENOUS
  Filled 2022-03-14 (×4): qty 4

## 2022-03-14 MED ORDER — HEPARIN (PORCINE) IN NACL 1000-0.9 UT/500ML-% IV SOLN
INTRAVENOUS | Status: DC | PRN
  Administered 2022-03-14 (×2): 500 mL

## 2022-03-14 MED ORDER — SODIUM CHLORIDE 0.9% FLUSH
3.0000 mL | Freq: Two times a day (BID) | INTRAVENOUS | Status: DC
  Administered 2022-03-14 – 2022-03-20 (×9): 3 mL via INTRAVENOUS

## 2022-03-14 MED ORDER — SODIUM CHLORIDE 0.9 % IV SOLN
250.0000 mL | INTRAVENOUS | Status: DC | PRN
  Administered 2022-03-16: 250 mL via INTRAVENOUS

## 2022-03-14 MED ORDER — HEPARIN SODIUM (PORCINE) 1000 UNIT/ML IJ SOLN
INTRAMUSCULAR | Status: AC
  Filled 2022-03-14: qty 10

## 2022-03-14 MED ORDER — SODIUM CHLORIDE 0.9% FLUSH
3.0000 mL | Freq: Two times a day (BID) | INTRAVENOUS | Status: DC
  Administered 2022-03-14 – 2022-03-20 (×10): 3 mL via INTRAVENOUS

## 2022-03-14 MED ORDER — HYDRALAZINE HCL 20 MG/ML IJ SOLN
5.0000 mg | INTRAMUSCULAR | Status: AC | PRN
  Administered 2022-03-16 (×2): 5 mg via INTRAVENOUS
  Filled 2022-03-14 (×2): qty 1

## 2022-03-14 MED ORDER — HEPARIN SODIUM (PORCINE) 1000 UNIT/ML IJ SOLN
INTRAMUSCULAR | Status: DC | PRN
  Administered 2022-03-14: 9000 [IU] via INTRAVENOUS
  Administered 2022-03-14: 3000 [IU] via INTRAVENOUS
  Administered 2022-03-14: 4000 [IU] via INTRAVENOUS

## 2022-03-14 MED ORDER — CLOPIDOGREL BISULFATE 300 MG PO TABS
ORAL_TABLET | ORAL | Status: AC
  Filled 2022-03-14: qty 1

## 2022-03-14 MED ORDER — HEPARIN (PORCINE) IN NACL 1000-0.9 UT/500ML-% IV SOLN
INTRAVENOUS | Status: AC
  Filled 2022-03-14: qty 1000

## 2022-03-14 MED ORDER — ACETAMINOPHEN 325 MG PO TABS
650.0000 mg | ORAL_TABLET | ORAL | Status: DC | PRN
  Administered 2022-03-15 – 2022-03-20 (×8): 650 mg via ORAL
  Filled 2022-03-14 (×8): qty 2

## 2022-03-14 MED ORDER — HEPARIN (PORCINE) 25000 UT/250ML-% IV SOLN
1350.0000 [IU]/h | INTRAVENOUS | Status: AC
  Administered 2022-03-15 – 2022-03-16 (×2): 1250 [IU]/h via INTRAVENOUS
  Administered 2022-03-17: 1300 [IU]/h via INTRAVENOUS
  Administered 2022-03-18: 1350 [IU]/h via INTRAVENOUS
  Filled 2022-03-14 (×5): qty 250

## 2022-03-14 MED ORDER — CLOPIDOGREL BISULFATE 300 MG PO TABS
ORAL_TABLET | ORAL | Status: DC | PRN
  Administered 2022-03-14: 300 mg via ORAL

## 2022-03-14 MED ORDER — MIDAZOLAM HCL 2 MG/2ML IJ SOLN
INTRAMUSCULAR | Status: AC
  Filled 2022-03-14: qty 2

## 2022-03-14 MED ORDER — MIDAZOLAM HCL 2 MG/2ML IJ SOLN
INTRAMUSCULAR | Status: DC | PRN
  Administered 2022-03-14: 1 mg via INTRAVENOUS

## 2022-03-14 MED ORDER — IODIXANOL 320 MG/ML IV SOLN
INTRAVENOUS | Status: DC | PRN
  Administered 2022-03-14: 135 mL

## 2022-03-14 MED ORDER — SODIUM CHLORIDE 0.9 % IV SOLN
250.0000 mL | INTRAVENOUS | Status: DC | PRN
  Administered 2022-03-17: 250 mL via INTRAVENOUS

## 2022-03-14 MED ORDER — NITROGLYCERIN 1 MG/10 ML FOR IR/CATH LAB
INTRA_ARTERIAL | Status: DC | PRN
  Administered 2022-03-14: 200 ug via INTRA_ARTERIAL

## 2022-03-14 MED ORDER — ONDANSETRON HCL 4 MG/2ML IJ SOLN
4.0000 mg | Freq: Four times a day (QID) | INTRAMUSCULAR | Status: DC | PRN
  Administered 2022-03-14 – 2022-03-20 (×5): 4 mg via INTRAVENOUS
  Filled 2022-03-14 (×5): qty 2

## 2022-03-14 MED ORDER — FENTANYL CITRATE (PF) 100 MCG/2ML IJ SOLN
INTRAMUSCULAR | Status: AC
  Filled 2022-03-14: qty 2

## 2022-03-14 SURGICAL SUPPLY — 32 items
BAG SNAP BAND KOVER 36X36 (MISCELLANEOUS) ×1 IMPLANT
BALLN STERLING OTW 3X60X150 (BALLOONS) ×3
BALLOON STERLING OTW 3X60X150 (BALLOONS) IMPLANT
CATH ANGIO 5F PIGTAIL 65CM (CATHETERS) ×1 IMPLANT
CATH CROSS OVER TEMPO 5F (CATHETERS) ×1 IMPLANT
CATH CXI 2.3F 150 ANG 2 (CATHETERS) ×1 IMPLANT
CATH CXI 2.3F 150 ST (CATHETERS) ×1 IMPLANT
CATH STRAIGHT 5FR 65CM (CATHETERS) ×1 IMPLANT
COVER DOME SNAP 22 D (MISCELLANEOUS) ×1 IMPLANT
CROWN STEALTH MICRO-30 1.25MM (CATHETERS) ×1 IMPLANT
DEVICE EMBOSHIELD NAV6 2.5-4.8 (FILTER) ×1 IMPLANT
GLIDEWIRE ADV .035X260CM (WIRE) ×1 IMPLANT
GUIDEWIRE ANGLED .035X150CM (WIRE) ×1 IMPLANT
KIT ANGIASSIST CO2 SYSTEM (KITS) ×1 IMPLANT
KIT ENCORE 26 ADVANTAGE (KITS) ×1 IMPLANT
KIT MICROPUNCTURE NIT STIFF (SHEATH) ×1 IMPLANT
KIT PV (KITS) ×3 IMPLANT
LUBRICANT VIPERSLIDE CORONARY (MISCELLANEOUS) ×1 IMPLANT
SHEATH PINNACLE 5F 10CM (SHEATH) ×1 IMPLANT
SHEATH PINNACLE 6F 10CM (SHEATH) ×1 IMPLANT
SHEATH PINNACLE ST 6F 65CM (SHEATH) ×1 IMPLANT
SHEATH PROBE COVER 6X72 (BAG) ×1 IMPLANT
STOPCOCK MORSE 400PSI 3WAY (MISCELLANEOUS) ×1 IMPLANT
SYR MEDRAD MARK 7 150ML (SYRINGE) ×3 IMPLANT
TAPE SHOOT N SEE (TAPE) ×1 IMPLANT
TRANSDUCER W/STOPCOCK (MISCELLANEOUS) ×3 IMPLANT
TRAY PV CATH (CUSTOM PROCEDURE TRAY) ×3 IMPLANT
TUBING CIL FLEX 10 FLL-RA (TUBING) ×1 IMPLANT
WIRE HI TORQ COMMND ES.014X300 (WIRE) ×1 IMPLANT
WIRE HI TORQ VERSACORE-J 145CM (WIRE) ×1 IMPLANT
WIRE SPARTACORE .014X300CM (WIRE) ×1 IMPLANT
WIRE VIPER ADVANCE .017X335CM (WIRE) ×1 IMPLANT
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
## 2022-03-14 NOTE — Consult Note (Signed)
[facility] CM Inpatient Consult ? ? ?03/14/2022 ? ?[place] ?08/15/1951 ?[id number] ? ?[place] [facility] CM) ?  ?Patient chart has been reviewed with noted high risk score for unplanned readmissions.  Patient assessed for community [place] [facility] follow up needs. Per review, patient currently in dialysis treatment area. ? ?Plan: Will continue to follow for progression and disposition plans. ? ?Of note, [facility] Care Management services does not replace or interfere with any services that are arranged by inpatient case management or social work.  ?  ?Netta Cedars, MSN, RN ?[facility] Liaison ?Phone [phone] ?Toll free office [phone] ?

## 2022-03-14 NOTE — Hospital Course (Addendum)
71 y.o. male with history of ESRD on hemodialysis, CAD, peripheral artery disease, diabetes mellitus, atrial fibrillation was brought to the ER after patient was found missing by patient's family.  Patient states he was driving out of the dialysis and was trying to get some food.  He does not remember being confused. Patient's family found patient in the driving lot sitting inside the car and was brought to the ER. During last dialysis session prior to 5/15 he briefly had a CODE and was briefly resuscitated at the time patient refused to come to the ER.  High sensitive troponin was 107, 117.  EKG shows normal sinus rhythm.  Had blood cultures drawn.  X-rays of the right foot was done because there was some discharge coming from the right second toe which shows features concerning for osteomyelitis.  Antibiotics have been started.  Patient was admitted for further work-up.  Cardiology podiatry and nephrology consulted. Had successful right TP trunk and medial RT common peroneal artery revascularization by Dr. Virgina Jock 5/17  Underwent right second digit partial amputation 5/19.  Postop patient was doing well had episode of chest pain likely from chest compressions for the past week.  EKG nonischemic.  Underwent dialysis per nephrology.  At this time we will discharge home.  Eliquis was resumed heparin discontinued.  Wound culture came back with multiple organism no Staph aureus no group A strep no anaerobes.

## 2022-03-14 NOTE — Progress Notes (Signed)
Pt had orders for IV Heparin continuous was not running when arriving to unit for  Dr. Earnie Larsson paged for clarification. MD confirmed should be stopped to resume a 0800 on 5/18.  ?

## 2022-03-14 NOTE — Progress Notes (Signed)
?PROGRESS NOTE ?[facility] Webb [facility]  YKZ:[id number] DOB: 1951/04/06 DOA: 03/12/2022 ?PCP: Collene Leyden, MD  ? ?Brief Narrative/Hospital Course: ? 71 y.o. male with history of ESRD on hemodialysis, CAD, peripheral artery disease, diabetes mellitus, atrial fibrillation was brought to the ER after patient was found missing by patient's family.  Patient states he was driving out of the dialysis and was trying to get some food.  He does not remember being confused. Patient's family found patient in the driving lot sitting inside the car and was brought to the ER. ?During last dialysis session prior to 5/15 he briefly had a CODE and was briefly resuscitated at the time patient refused to come to the ER.  High sensitive troponin was 107, 117.  EKG shows normal sinus rhythm.  Had blood cultures drawn.  X-rays of the right foot was done because there was some discharge coming from the right second toe which shows features concerning for osteomyelitis.  Antibiotics have been started.  Patient was admitted for further work-up.  Cardiology podiatry and nephrology consulted  ?  ?Subjective: ?Seen and examined this morning in dialysis denies any complaint.  Resting comfortably. ?  ?Assessment and Plan: ?Principal Problem: ?  Acute osteomyelitis of toe, right ([place]) ?Active Problems: ?  Diabetes mellitus with renal complications ([place]) ?  Persistent atrial fibrillation ([place]) ?  ESRD (end stage renal disease) on dialysis [facility]) ?  SIRS (systemic inflammatory response syndrome) (HCC) ?  Osteomyelitis ([place]) ?  ?Acute osteomyelitis of second toe of the right foot on empiric antibiotics, podiatry consulted Dr. Blenda Mounts will see the patient, continue vancomycin, cefepime.  Cardiology planning for peripheral angiogram ABI -Abnormal likely not accurate. ? ?Elevated troponin ?History of CAD ?Recent brief cardiac arrest in HD 5/12:  ?Given patient's complicated cardiac history , ?cardiac event in dialysis cardiology consulted, continue with  patient's current Coreg. ? ?Acute metabolic encephalopathy likely from osteomyelitis/infection in the setting of multiple comorbidities.  At this time has returned to baseline.  Continue supportive care ? ?ESRD on HD MWF: Nephrology on board for dialysis, getting dialysis today. ?MBD CKD: Calcium post stable at goal continue binders ?Anemia of ESRD: Hemoglobin is stable.  Monitor ESA not due until 5/24 ?Recent Labs  ?Lab 03/12/22 ?2202 03/12/22 ?2216 03/13/22 ?9390 03/14/22 ?0458  ?HGB 12.0* 13.3 11.3* 9.7*  ?HCT 36.7* 39.0 34.5* 29.8*  ?  ?Diabetes mellitus with renal complications, stable/controlled glucose level, keep on SSI ?Recent Labs  ?Lab 03/13/22 ?0801 03/13/22 ?1151 03/13/22 ?1645 03/14/22 ?0726 03/14/22 ?1239  ?GLUCAP 144* 113* 102* 111* 112*  ?  ?Persistent atrial fibrillation: Rate controlled , cont Coreg and Eliquis ? ?Hypertension urgency ?Hypertension blood pressure controlled continue Imdur hydralazine Lasix ? ?DVT prophylaxis:   Eliquis on hold. SCD ?Code Status:   Code Status: Full Code ?Family Communication: plan of care discussed with patient at bedside. ?Patient status is: Inpatient because of ongoing management of osteomyelitis ?Level of care: Telemetry Medical  ? ?Dispo: The patient is from: home ?           Anticipated disposition: Home 2-3 days in ? ?Mobility Assessment (last 72 hours)   ? ? Mobility Assessment   ? ? [place] Name 03/13/22 1850  ?  ?  ?  ?  ? Does patient have an order for bedrest or is patient medically unstable No - Continue assessment      ? What is the highest level of mobility based on the progressive mobility assessment? Level 5 (Walks with assist in room/hall) -  Balance while stepping forward/back and can walk in room with assist - Complete      ? ?  ?  ? ?  ?  ? ?Objective: ?Vitals last 24 hrs: ?Vitals:  ? 03/14/22 1130 03/14/22 1200 03/14/22 1205 03/14/22 1241  ?BP: (!) 184/77 (!) 154/94  103/85  ?Pulse: 91 92 96 94  ?Resp: '14 17 15 19  '$ ?Temp:    99.4 ?F (37.4 ?C)   ?TempSrc:    Oral  ?SpO2:    92%  ?Weight:      ?Height:      ? ?Weight change:  ? ?Physical Examination: ?General exam: alert awake,older than stated age, weak appearing. ?HEENT:Oral mucosa moist, Ear/Nose WNL grossly, dentition normal. ?Respiratory system: bilaterally diminished BS, no use of accessory muscle ?Cardiovascular system: S1 & S2 +, No JVD. ?Gastrointestinal system: Abdomen soft,NT,ND, BS+ ?Nervous System:Alert, awake, moving extremities and grossly nonfocal ?Extremities: LE edema chronic appearing skin changes, heel wound on the left foot and wound on the right second toe ?Skin: No rashes,no icterus. ?MSK: Normal muscle bulk,tone, power ? ?Medications reviewed:  ?Scheduled Meds: ? carvedilol  12.5 mg Oral BID  ? Chlorhexidine Gluconate Cloth  6 each Topical Q0600  ? doxercalciferol  3 mcg Intravenous Q M,W,F-HD  ? furosemide  80 mg Oral BID  ? gabapentin  300 mg Oral BID  ? hydrALAZINE  50 mg Oral BID  ? insulin aspart  0-6 Units Subcutaneous TID WC  ? isosorbide mononitrate  60 mg Oral QHS  ? sevelamer carbonate  800 mg Oral TID with meals  ? sodium chloride flush  3 mL Intravenous Q12H  ? sucroferric oxyhydroxide  500 mg Oral TID  ? tamsulosin  0.4 mg Oral QHS  ? vitamin B-12  5,000 mcg Oral Daily  ? ?Continuous Infusions: ? sodium chloride    ? ceFEPime (MAXIPIME) IV 1 g (03/14/22 0015)  ? heparin 1,250 Units/hr (03/14/22 0021)  ? vancomycin Stopped (03/14/22 1213)  ? ? ?  ?Diet Order   ? ?       ?  Diet NPO time specified  Diet effective now       ?  ? ?  ?  ? ?  ?  ? ?  ?  ?  ? ? ?Intake/Output Summary (Last 24 hours) at 03/14/2022 1310 ?Last data filed at 03/14/2022 1205 ?Gross per 24 hour  ?Intake 12.5 ml  ?Output 1000 ml  ?Net -987.5 ml  ? ?Net IO Since Admission: -987.5 mL [03/14/22 1310]  ?Wt Readings from Last 3 Encounters:  ?03/14/22 85.4 kg  ?12/05/21 84.3 kg  ?10/19/21 86.6 kg  ?  ? ?Unresulted Labs (From admission, onward)  ? ?  Start     Ordered  ? 03/15/22 0500  Heparin level  (unfractionated)  Daily,   R     ?Question:  Specimen collection method  Answer:  Lab=Lab collect  ? 03/14/22 1011  ? 03/15/22 0500  APTT  Daily,   R     ?Question:  Specimen collection method  Answer:  Lab=Lab collect  ? 03/14/22 1011  ? 03/14/22 0500  CBC  Daily,   R     ? 03/13/22 0322  ? 03/14/22 0001  Hepatitis B surface antibody,quantitative  Once,   R       ? 03/14/22 0001  ? 03/12/22 2134  Urinalysis, Routine w reflex microscopic  (Undifferentiated presentation (screening labs and basic nursing orders))  ONCE - URGENT,   URGENT       ?  Mar 20, 2022 2134  ? 03-20-22 2134  Urine Culture  (Undifferentiated presentation (screening labs and basic nursing orders))  ONCE - URGENT,   URGENT       ?Question:  Indication  Answer:  Sepsis  ? Mar 20, 2022 2134  ? ?  ?  ? ?  ?Data Reviewed: I have personally reviewed following labs and imaging studies ?CBC: ?Recent Labs  ?Lab Mar 20, 2022 ?2202 2022-03-20 ?2216 03/13/22 ?3220 03/14/22 ?0458  ?WBC 10.5  --  8.6 6.3  ?NEUTROABS 9.1*  --   --   --   ?HGB 12.0* 13.3 11.3* 9.7*  ?HCT 36.7* 39.0 34.5* 29.8*  ?MCV 93.9  --  93.2 92.3  ?PLT 190  --  168 175  ? ?Basic Metabolic Panel: ?Recent Labs  ?Lab Mar 20, 2022 ?2202 2022-03-20 ?2216 03/13/22 ?0336 03/13/22 ?2211 03/14/22 ?0458  ?NA 138 138 136  --  134*  ?K 3.6 3.6 3.4*  --  3.8  ?CL 94* 93* 94*  --  94*  ?CO2 29  --  31  --  28  ?GLUCOSE 100* 98 182*  --  115*  ?BUN 23 22 28*  --  38*  ?CREATININE 5.65* 6.30* 6.43*  --  8.11*  ?CALCIUM 8.0*  --  7.6*  --  8.0*  ?PHOS  --   --   --  4.0  --   ? ?GFR: ?Estimated Creatinine Clearance: 9.8 mL/min (A) (by C-G formula based on SCr of 8.11 mg/dL (H)). ?Liver Function Tests: ?Recent Labs  ?Lab 03-20-2022 ?2202 03/13/22 ?0336  ?AST 15 18  ?ALT 11 10  ?ALKPHOS 98 83  ?BILITOT 1.3* 0.8  ?PROT 8.2* 7.3  ?ALBUMIN 3.5 3.1*  ? ?No results for input(s): LIPASE, AMYLASE in the last 168 hours. ?No results for input(s): AMMONIA in the last 168 hours. ?Coagulation Profile: ?Recent Labs  ?Lab 2022/03/20 ?2202  ?INR  1.3*  ? ?BNP (last 3 results) ?No results for input(s): PROBNP in the last 8760 hours. ?HbA1C: ?No results for input(s): HGBA1C in the last 72 hours. ?CBG: ?Recent Labs  ?Lab 03/13/22 ?0801 03/13/22 ?1151 03/13/22 ?2542
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
## 2022-03-14 NOTE — Progress Notes (Signed)
Pt receives out-pt HD at [facility] on MWF. Pt arrives at 12:00-12:15 for 12:30 chair time. Will assist as needed.  ? ?Melven Sartorius ?Renal Navigator ?[phone]  ?

## 2022-03-14 NOTE — Progress Notes (Signed)
31f sheath aspirated and removed from left femoral artery. Manual pressure applied for 20 minutes. Site level 0 , no s+s of hematoma. Tegaderm dressing applied. Bedrest instructions given.  ? ?Left dp an pt pulses present with doppler. ? ?Right dp present with doppler, right pt absent. ? ?Bedrest begins at 23:30:00 ?
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
## 2022-03-14 NOTE — CV Procedure (Signed)
Successful Rt TP trunk and mid Rt common peroneal artery revascularization. ?Full report to follow. ? ? ?Nigel Mormon, MD ?Pager: [phone] ?Office: [phone] ? ?

## 2022-03-14 NOTE — Interval H&P Note (Signed)
History and Physical Interval Note: ? ?03/14/2022 ?3:33 PM ? ?William Webb [facility]  has presented today for surgery, with the diagnosis of critical limb ischemia.  The various methods of treatment have been discussed with the patient and family. After consideration of risks, benefits and other options for treatment, the patient has consented to  Procedure(s): ?ABDOMINAL AORTOGRAM W/LOWER EXTREMITY (N/A) as a surgical intervention.  The patient's history has been reviewed, patient examined, no change in status, stable for surgery.  I have reviewed the patient's chart and labs.  Questions were answered to the patient's satisfaction.   ? ? ?[place] ? ? ?
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
## 2022-03-14 NOTE — Progress Notes (Signed)
removed 1048ms net fluid.  pre bp 147/66 post bp 193/111 pre weight standing 85.4kg edw 85.5kg called nephrology whom stated remove 1 to 2 liters.  patient stated clinic removed too much fluid and he coded in dialysis on f2024/04/02  post weight 84.0kg.  2 bandages to lua avf no bleeding dressing cdi.  gave hectorol and vancomycin as ordered. ?

## 2022-03-15 ENCOUNTER — Inpatient Hospital Stay (HOSPITAL_COMMUNITY): Payer: Medicare HMO

## 2022-03-15 ENCOUNTER — Other Ambulatory Visit (HOSPITAL_COMMUNITY): Payer: Medicare HMO

## 2022-03-15 ENCOUNTER — Encounter (HOSPITAL_COMMUNITY): Payer: Self-pay | Admitting: Cardiology

## 2022-03-15 DIAGNOSIS — M86171 Other acute osteomyelitis, right ankle and foot: Secondary | ICD-10-CM | POA: Diagnosis not present

## 2022-03-15 LAB — CBC
HCT: 31.3 % — ABNORMAL LOW (ref 39.0–52.0)
Hemoglobin: 10.3 g/dL — ABNORMAL LOW (ref 13.0–17.0)
MCH: 30.4 pg (ref 26.0–34.0)
MCHC: 32.9 g/dL (ref 30.0–36.0)
MCV: 92.3 fL (ref 80.0–100.0)
Platelets: 170 10*3/uL (ref 150–400)
RBC: 3.39 MIL/uL — ABNORMAL LOW (ref 4.22–5.81)
RDW: 15.2 % (ref 11.5–15.5)
WBC: 5.8 10*3/uL (ref 4.0–10.5)
nRBC: 0 % (ref 0.0–0.2)

## 2022-03-15 LAB — ECHOCARDIOGRAM COMPLETE
AR max vel: 1.2 cm2
AV Area VTI: 1.21 cm2
AV Area mean vel: 1.13 cm2
AV Mean grad: 17 mmHg
AV Peak grad: 27.6 mmHg
Ao pk vel: 2.63 m/s
Area-P 1/2: 3.15 cm2
Height: 75 in
S' Lateral: 3.3 cm
Weight: 3022.95 oz

## 2022-03-15 LAB — BASIC METABOLIC PANEL
Anion gap: 8 (ref 5–15)
BUN: 20 mg/dL (ref 8–23)
CO2: 30 mmol/L (ref 22–32)
Calcium: 8.1 mg/dL — ABNORMAL LOW (ref 8.9–10.3)
Chloride: 98 mmol/L (ref 98–111)
Creatinine, Ser: 5.4 mg/dL — ABNORMAL HIGH (ref 0.61–1.24)
GFR, Estimated: 11 mL/min — ABNORMAL LOW (ref >60)
Glucose, Bld: 128 mg/dL — ABNORMAL HIGH (ref 70–99)
Potassium: 3.5 mmol/L (ref 3.5–5.1)
Sodium: 136 mmol/L (ref 135–145)

## 2022-03-15 LAB — LIPID PANEL
Cholesterol: 139 mg/dL (ref 0–200)
HDL: 27 mg/dL — ABNORMAL LOW (ref >40)
LDL Cholesterol: 87 mg/dL (ref 0–99)
Total CHOL/HDL Ratio: 5.1 RATIO
Triglycerides: 127 mg/dL (ref <150)
VLDL: 25 mg/dL (ref 0–40)

## 2022-03-15 LAB — GLUCOSE, CAPILLARY
Glucose-Capillary: 113 mg/dL — ABNORMAL HIGH (ref 70–99)
Glucose-Capillary: 128 mg/dL — ABNORMAL HIGH (ref 70–99)
Glucose-Capillary: 134 mg/dL — ABNORMAL HIGH (ref 70–99)
Glucose-Capillary: 153 mg/dL — ABNORMAL HIGH (ref 70–99)

## 2022-03-15 LAB — APTT: aPTT: 66 seconds — ABNORMAL HIGH (ref 24–36)

## 2022-03-15 LAB — HEPARIN LEVEL (UNFRACTIONATED): Heparin Unfractionated: 0.49 IU/mL (ref 0.30–0.70)

## 2022-03-15 LAB — HEPATITIS B SURFACE ANTIBODY, QUANTITATIVE: Hep B S AB Quant (Post): >1000 m[IU]/mL (ref >9.9)

## 2022-03-15 MED ORDER — ALUM & MAG HYDROXIDE-SIMETH 200-200-20 MG/5ML PO SUSP
30.0000 mL | ORAL | Status: DC | PRN
Start: 2022-03-15 — End: 2022-03-20

## 2022-03-15 NOTE — Progress Notes (Signed)
PROGRESS NOTE [facility]  SJG:[id number] DOB: 04-20-1951 DOA: 03/12/2022 PCP: Collene Leyden, MD   Brief Narrative/Hospital Course:  71 y.o. male with history of ESRD on hemodialysis, CAD, peripheral artery disease, diabetes mellitus, atrial fibrillation was brought to the ER after patient was found missing by patient's family.  Patient states he was driving out of the dialysis and was trying to get some food.  He does not remember being confused. Patient's family found patient in the driving lot sitting inside the car and was brought to the ER. During last dialysis session prior to 5/15 he briefly had a CODE and was briefly resuscitated at the time patient refused to come to the ER.  High sensitive troponin was 107, 117.  EKG shows normal sinus rhythm.  Had blood cultures drawn.  X-rays of the right foot was done because there was some discharge coming from the right second toe which shows features concerning for osteomyelitis.  Antibiotics have been started.  Patient was admitted for further work-up.  Cardiology podiatry and nephrology consulted    Subjective: Seen and examined.  Resting comfortably.  He is on the phone with his daughter.  Denies any new complaints. Assessment and Plan: Principal Problem:   Acute osteomyelitis of toe, right (HCC) Active Problems:   Diabetes mellitus with renal complications (HCC)   Persistent atrial fibrillation (HCC)   ESRD (end stage renal disease) on dialysis (HCC)   SIRS (systemic inflammatory response syndrome) (HCC)   Osteomyelitis (HCC)   Critical limb ischemia of right lower extremity (HCC)   Acute osteomyelitis of second toe of the right foot on empiric antibiotics PVD: Dr. Blenda Mounts following, he had successful right TP trunk and medial RT common peroneal artery revascularization by Dr. Virgina Jock 5/17.  Plavix started 5/18.  Based upon his revascularization Dr. Blenda Mounts planning for partial second amputation Friday afternoon.  Continue  vancomycin/cefepime for now.  Elevated troponin History of CAD Recent brief cardiac arrest in HD 5/12:  Cardiology following closely statin Plavix due to PVD revascularization see above, continue Imdur, Coreg 12.5, continue as per cardiology.    Acute metabolic encephalopathy likely from osteomyelitis/infection in the setting of multiple comorbidities.  Mental status improved and stable.   ESRD on HD MWF: Nephrology on board for dialysis, last HD 5/17,-?  Repeat HD post catheterization-defer to nephrology, who is following on board. MBD CKD: Calcium post stable at goal continue binders Anemia of ESRD: Hemoglobin overall stable,Monitor. ESA not due until 5/24 Recent Labs  Lab 03/12/22 2202 03/12/22 2216 03/13/22 0336 03/14/22 0458 03/15/22 0130  HGB 12.0* 13.3 11.3* 9.7* 10.3*  HCT 36.7* 39.0 34.5* 29.8* 31.3*    Diabetes mellitus with renal complications, blood sugar is stable/controlled glucose level, keep on SSI Recent Labs  Lab 03/13/22 1645 03/14/22 0726 03/14/22 1239 03/14/22 2149 03/15/22 0611  GLUCAP 102* 111* 112* 103* 113*    Persistent atrial fibrillation: Rate controlled.  Continue Coreg and Eliquis> changed to heparin drip  Hypertension urgency Hypertension: blood pressure well controlled continue Imdur hydralazine Lasix, and Coreg.  DVT prophylaxis: SCD's Start: 03/14/22 1940 heparin drip Code Status:   Code Status: Full Code Family Communication: plan of care discussed with patient at bedside.  Daughter was on the phone listening to the conversation. Patient status is: Inpatient because of ongoing management of osteomyelitis Level of care: Progressive Cardiac   Dispo: The patient is from: home            Anticipated disposition: Home 2-3 days in  Mobility  Assessment (last 72 hours)     Mobility Assessment     Row Name 03/15/22 0738 03/14/22 0800 03/13/22 1850       Does patient have an order for bedrest or is patient medically unstable No - Continue  assessment No - Continue assessment No - Continue assessment     What is the highest level of mobility based on the progressive mobility assessment? Level 5 (Walks with assist in room/hall) - Balance while stepping forward/back and can walk in room with assist - Complete Level 5 (Walks with assist in room/hall) - Balance while stepping forward/back and can walk in room with assist - Complete Level 5 (Walks with assist in room/hall) - Balance while stepping forward/back and can walk in room with assist - Complete               Objective: Vitals last 24 hrs: Vitals:   03/14/22 2208 03/14/22 2357 03/15/22 0342 03/15/22 0738  BP: (!) 147/62 135/61 (!) 129/51 (!) 129/59  Pulse:  69 66 65  Resp:  '20 19 13  '$ Temp:  98.5 F (36.9 C) 98.5 F (36.9 C) 99.2 F (37.3 C)  TempSrc:  Oral Oral Oral  SpO2:  97% 98% 94%  Weight:      Height:       Weight change:   Physical Examination: General exam: AAox3,older than stated age, weak appearing. HEENT:Oral mucosa moist, Ear/Nose WNL grossly, dentition normal. Respiratory system: bilaterally clear ,no use of accessory muscle Cardiovascular system: S1 & S2 +, No JVD,. Gastrointestinal system: Abdomen soft,NT,ND, BS+ Nervous System:Alert, awake, moving extremities and grossly nonfocal Extremities: edema neg, right second toe with dressing in place, warm perfused bilateral lower extremities  Skin: No rashes,no icterus. MSK: Normal muscle bulk,tone, power   Medications reviewed:  Scheduled Meds:  carvedilol  12.5 mg Oral BID   Chlorhexidine Gluconate Cloth  6 each Topical Q0600   clopidogrel  75 mg Oral Daily   doxercalciferol  3 mcg Intravenous Q M,W,F-HD   furosemide  80 mg Oral BID   gabapentin  300 mg Oral BID   hydrALAZINE  50 mg Oral BID   insulin aspart  0-6 Units Subcutaneous TID WC   isosorbide mononitrate  60 mg Oral QHS   sevelamer carbonate  800 mg Oral TID with meals   sodium chloride flush  3 mL Intravenous Q12H   sodium  chloride flush  3 mL Intravenous Q12H   sucroferric oxyhydroxide  500 mg Oral TID   tamsulosin  0.4 mg Oral QHS   vitamin B-12  5,000 mcg Oral Daily   Continuous Infusions:  sodium chloride     sodium chloride     ceFEPime (MAXIPIME) IV 1 g (03/15/22 0118)   heparin 1,250 Units/hr (03/15/22 0948)   vancomycin Stopped (03/14/22 1213)      Diet Order             Diet NPO time specified  Diet effective midnight           Diet renal/carb modified with fluid restriction Diet-HS Snack? Nothing; Fluid restriction: 1200 mL Fluid; Room service appropriate? Yes; Fluid consistency: Thin  Diet effective now                            Intake/Output Summary (Last 24 hours) at 03/15/2022 1100 Last data filed at 03/15/2022 0800 Gross per 24 hour  Intake 1069.8 ml  Output 1000 ml  Net 69.8 ml  Net IO Since Admission: 82.3 mL [03/15/22 1100]  Wt Readings from Last 3 Encounters:  03/14/22 85.7 kg  12/05/21 84.3 kg  10/19/21 86.6 kg     Unresulted Labs (From admission, onward)     Start     Ordered   03/16/22 0500  Heparin level (unfractionated)  Daily,   R     Question:  Specimen collection method  Answer:  Lab=Lab collect   03/14/22 2242   03/16/22 0500  APTT  Daily,   R     Question:  Specimen collection method  Answer:  Lab=Lab collect   03/14/22 2246   03/15/22 1800  APTT  Once-Timed,   TIMED       Question:  Specimen collection method  Answer:  Lab=Lab collect   03/15/22 0953   03/15/22 1800  Heparin level (unfractionated)  Once-Timed,   TIMED       Question:  Specimen collection method  Answer:  Lab=Lab collect   03/15/22 0953   03/15/22 9563  Basic metabolic panel  Daily,   R     Question:  Specimen collection method  Answer:  Lab=Lab collect   03/14/22 1320   03/14/22 0500  CBC  Daily,   R      03/13/22 0322          Data Reviewed: I have personally reviewed following labs and imaging studies CBC: Recent Labs  Lab 03/12/22 2202 03/12/22 2216  03/13/22 0336 03/14/22 0458 03/15/22 0130  WBC 10.5  --  8.6 6.3 5.8  NEUTROABS 9.1*  --   --   --   --   HGB 12.0* 13.3 11.3* 9.7* 10.3*  HCT 36.7* 39.0 34.5* 29.8* 31.3*  MCV 93.9  --  93.2 92.3 92.3  PLT 190  --  168 175 875   Basic Metabolic Panel: Recent Labs  Lab 03/12/22 2202 03/12/22 2216 03/13/22 0336 03/13/22 2211 03/14/22 0458 03/15/22 0130  NA 138 138 136  --  134* 136  K 3.6 3.6 3.4*  --  3.8 3.5  CL 94* 93* 94*  --  94* 98  CO2 29  --  31  --  28 30  GLUCOSE 100* 98 182*  --  115* 128*  BUN 23 22 28*  --  38* 20  CREATININE 5.65* 6.30* 6.43*  --  8.11* 5.40*  CALCIUM 8.0*  --  7.6*  --  8.0* 8.1*  PHOS  --   --   --  4.0  --   --    GFR: Estimated Creatinine Clearance: 14.8 mL/min (A) (by C-G formula based on SCr of 5.4 mg/dL (H)). Liver Function Tests: Recent Labs  Lab 03/12/22 2202 03/13/22 0336  AST 15 18  ALT 11 10  ALKPHOS 98 83  BILITOT 1.3* 0.8  PROT 8.2* 7.3  ALBUMIN 3.5 3.1*   No results for input(s): LIPASE, AMYLASE in the last 168 hours. No results for input(s): AMMONIA in the last 168 hours. Coagulation Profile: Recent Labs  Lab 03/12/22 2202  INR 1.3*   BNP (last 3 results) No results for input(s): PROBNP in the last 8760 hours. HbA1C: No results for input(s): HGBA1C in the last 72 hours. CBG: Recent Labs  Lab 03/13/22 1645 03/14/22 0726 03/14/22 1239 03/14/22 2149 03/15/22 0611  GLUCAP 102* 111* 112* 103* 113*   Lipid Profile: Recent Labs    03/15/22 0130  CHOL 139  HDL 27*  LDLCALC 87  TRIG 127  CHOLHDL 5.1   Thyroid  Function Tests: No results for input(s): TSH, T4TOTAL, FREET4, T3FREE, THYROIDAB in the last 72 hours. Sepsis Labs: Recent Labs  Lab 03/12/22 2202  LATICACIDVEN 1.1    Recent Results (from the past 240 hour(s))  Blood Culture (routine x 2)     Status: None (Preliminary result)   Collection Time: 03/12/22 10:02 PM   Specimen: BLOOD  Result Value Ref Range Status   Specimen Description    Final    BLOOD BLOOD RIGHT FOREARM Performed at [facility] [address], [place], [place] [address]    Special Requests   Final    BOTTLES DRAWN AEROBIC AND ANAEROBIC Blood Culture adequate volume Performed at [facility] [address] [place], [place] [address]    Culture   Final    NO GROWTH 2 DAYS Performed at [facility] [address], [place], [place] [address]    Report Status PENDING  Incomplete  Resp Panel by RT-PCR (Flu A&B, Covid) Nasopharyngeal Swab     Status: None   Collection Time: 03/12/22 10:53 PM   Specimen: Nasopharyngeal Swab; Nasopharyngeal(NP) swabs in vial transport medium  Result Value Ref Range Status   SARS Coronavirus 2 by RT PCR NEGATIVE NEGATIVE Final    Comment: (NOTE) SARS-CoV-2 target nucleic acids are NOT DETECTED.  The SARS-CoV-2 RNA is generally detectable in upper respiratory specimens during the acute phase of infection. The lowest concentration of SARS-CoV-2 viral copies this assay can detect is 138 copies/mL. A negative result does not preclude SARS-Cov-2 infection and should not be used as the sole basis for treatment or other patient management decisions. A negative result may occur with  improper specimen collection/handling, submission of specimen other than nasopharyngeal swab, presence of viral mutation(s) within the areas targeted by this assay, and inadequate number of viral copies(<138 copies/mL). A negative result must be combined with clinical observations, patient history, and epidemiological information. The expected result is Negative.  Fact Sheet for Patients:  EntrepreneurPulse.com.au  Fact Sheet for Healthcare Providers:  IncredibleEmployment.be  This test is no t yet approved or cleared by the [place] FDA and  has been authorized for detection and/or diagnosis of SARS-CoV-2 by FDA under an Emergency Use Authorization (EUA).  This EUA will remain  in effect (meaning this test can be used) for the duration of the COVID-19 declaration under Section 564(b)(1) of the Act, 21 U.S.C.section 360bbb-3(b)(1), unless the authorization is terminated  or revoked sooner.       Influenza A by PCR NEGATIVE NEGATIVE Final   Influenza B by PCR NEGATIVE NEGATIVE Final    Comment: (NOTE) The Xpert Xpress SARS-CoV-2/FLU/RSV plus assay is intended as an aid in the diagnosis of influenza from Nasopharyngeal swab specimens and should not be used as a sole basis for treatment. Nasal washings and aspirates are unacceptable for Xpert Xpress SARS-CoV-2/FLU/RSV testing.  Fact Sheet for Patients: EntrepreneurPulse.com.au  Fact Sheet for Healthcare Providers: IncredibleEmployment.be  This test is not yet approved or cleared by the [place] FDA and has been authorized for detection and/or diagnosis of SARS-CoV-2 by FDA under an Emergency Use Authorization (EUA). This EUA will remain in effect (meaning this test can be used) for the duration of the COVID-19 declaration under Section 564(b)(1) of the Act, 21 U.S.C. section 360bbb-3(b)(1), unless the authorization is terminated or revoked.  Performed at [facility], [place] [address], [place], [place] [address]   Blood Culture (routine x 2)     Status:  None (Preliminary result)   Collection Time: 03/12/22 11:30 PM   Specimen: BLOOD  Result Value Ref Range Status   Specimen Description   Final    BLOOD BLOOD RIGHT ARM Performed at [facility] [address] [place], [place] [address]    Special Requests   Final    BOTTLES DRAWN AEROBIC AND ANAEROBIC Blood Culture adequate volume Performed at [facility] [address] [place], [place] [address]    Culture   Final    NO GROWTH 2 DAYS Performed at [facility] [address], [place], [place] [address]    Report Status  PENDING  Incomplete  MRSA Next Gen by PCR, Nasal     Status: None   Collection Time: 03/14/22  7:30 PM   Specimen: Nasal Mucosa; Nasal Swab  Result Value Ref Range Status   MRSA by PCR Next Gen NOT DETECTED NOT DETECTED Final    Comment: (NOTE) The GeneXpert MRSA Assay (FDA approved for NASAL specimens only), is one component of a comprehensive MRSA colonization surveillance program. It is not intended to diagnose MRSA infection nor to guide or monitor treatment for MRSA infections. Test performance is not FDA approved in patients less than 47 years old. Performed at [facility], [place] [address], [place], [place] [address]     Antimicrobials: Anti-infectives (From admission, onward)    Start     Dose/Rate Route Frequency Ordered Stop   03/14/22 1200  vancomycin (VANCOCIN) IVPB 1000 mg/200 mL premix        1,000 mg 200 mL/hr over 60 Minutes Intravenous Every M-W-F (Hemodialysis) 03/13/22 0941     03/13/22 2300  ceFEPIme (MAXIPIME) 1 g in sodium chloride 0.9 % 100 mL IVPB        1 g 200 mL/hr over 30 Minutes Intravenous Every 24 hours 03/13/22 0938     03/13/22 0342  vancomycin variable dose per unstable renal function (pharmacist dosing)  Status:  Discontinued         Does not apply See admin instructions 03/13/22 0343 03/13/22 0941   03/13/22 0000  ceFEPIme (MAXIPIME) 2 g in sodium chloride 0.9 % 100 mL IVPB  Status:  Discontinued        2 g 200 mL/hr over 30 Minutes Intravenous Every 24 hours 03/12/22 2256 03/13/22 0938   03/12/22 2300  vancomycin (VANCOCIN) IVPB 1000 mg/200 mL premix        1,000 mg 200 mL/hr over 60 Minutes Intravenous  Once 03/12/22 2253 03/13/22 0132      Culture/Microbiology    Component Value Date/Time   SDES  03/12/2022 2330    BLOOD BLOOD RIGHT ARM Performed at [facility], [place] [address] [place], [place] [address]    [place]  03/12/2022 2330    BOTTLES DRAWN AEROBIC AND ANAEROBIC Blood Culture adequate  volume Performed at [facility] [address] [place], [place] [address]    CULT  03/12/2022 2330    NO GROWTH 2 DAYS Performed at [facility] [address] [place],  [address]    REPTSTATUS PENDING 03/12/2022 2330    Other culture-see note  Radiology Studies: PERIPHERAL VASCULAR CATHETERIZATION  Addendum Date: 03/14/2022     Prox R ATA to Dist R ATA lesion is 100% stenosed.   Mid R TP Trunk to Prox R Peroneal lesion is 80% stenosed.   Mid R Peroneal lesion is 95% stenosed.   Oneita Hurt  PTA to Dist R PTA lesion is 100% stenosed.   Prox R SFA lesion is 30% stenosed.   Mid R SFA to Dist R SFA lesion is 30% stenosed.   Balloon angioplasty was performed.   Balloon angioplasty was performed.   Post intervention, there is a 0% residual stenosis.   Post intervention, there is a 0% residual stenosis. Rt TP trunk 80% stenosis Rt mid common peroneal artery 95% stenosis Prox occlusion of Rt ATA, Rt PT Successful orbital atherectomy and PTA 3.0X60 mm balloon to Rt TP trunk and Rt mid common peroneal artery with 0% residual stenosis Expect improvement in foot circulation and healing post amputation at the level of toe or transmetatarsal amputation. If difficulty healing noted, could then consider revascularization to Rt ATA with retrograde access to Rt DP.    Nigel Mormon, MD Pager: [phone] Office: [phone]  Addendum Date: 03/14/2022     Prox R ATA to Dist R ATA lesion is 100% stenosed.   Mid R TP Trunk to Prox R Peroneal lesion is 80% stenosed.   Mid R Peroneal lesion is 95% stenosed.   Ost R PTA to Dist R PTA lesion is 100% stenosed.   Prox R SFA lesion is 30% stenosed.   Mid R SFA to Dist R SFA lesion is 30% stenosed.   Balloon angioplasty was performed.   Balloon angioplasty was performed.   Post intervention, there is a 0% residual stenosis.   Post intervention, there is a 0% residual stenosis. Rt TP trunk 80% stenosis Rt mid common peroneal artery 95%  stenosis Prox occlusion of Rt ATA, Rt PT Successful orbital atherectomy and PTA 3.0X60 mm balloon to Rt TP trunk and Rt mid common peroneal artery with 0% residual stenosis Expect improvement in foot circulation and healing post amputation at the level of toe or transmetatarsal amputation. If difficulty healing noted, could then consider revascularization to Rt ATA with retrograde access to Rt DP. Nigel Mormon, MD Pager: [phone] Office: [phone]   Result Date: 03/14/2022   Prox R ATA to Dist R ATA lesion is 100% stenosed.   Mid R TP Trunk to Prox R Peroneal lesion is 80% stenosed.   Mid R Peroneal lesion is 95% stenosed.   Ost R PTA to Dist R PTA lesion is 100% stenosed.   Prox R SFA lesion is 30% stenosed.   Mid R SFA to Dist R SFA lesion is 30% stenosed.   Balloon angioplasty was performed.   Balloon angioplasty was performed.   Post intervention, there is a 0% residual stenosis.   Post intervention, there is a 0% residual stenosis.     LOS: 2 days   Antonieta Pert, MD [facility]  03/15/2022, 11:00 AM

## 2022-03-15 NOTE — Progress Notes (Signed)
  Echocardiogram 2D Echocardiogram has been performed.  William Webb 03/15/2022, 12:17 PM

## 2022-03-15 NOTE — Progress Notes (Signed)
Subjective:  Doing well  On bedside commode. Reports black stools, but now new. No loose stools.  Reportedly present since he has been on iron supplements.   Objective:  Vital Signs in the last 24 hours: Temp:  [98.5 F (36.9 C)-99.5 F (37.5 C)] 98.8 F (37.1 C) (05/18 1111) Pulse Rate:  [65-82] 68 (05/18 1111) Resp:  [12-20] 17 (05/18 1430) BP: (129-171)/(51-75) 130/51 (05/18 1430) SpO2:  [94 %-98 %] 94 % (05/18 1111) Weight:  [85.7 kg] 85.7 kg (05/17 1927)  Intake/Output from previous day: 05/17 0701 - 05/18 0700 In: 829.8 [I.V.:246.5; IV Piggyback:583.3] Out: 1000   Physical Exam Vitals and nursing note reviewed.  Constitutional:      General: He is not in acute distress. Neck:     Vascular: No JVD.  Cardiovascular:     Rate and Rhythm: Normal rate and regular rhythm.     Pulses:          Femoral pulses are 3+ on the right side and 3+ on the left side.      Popliteal pulses are 2+ on the right side and 2+ on the left side.       Dorsalis pedis pulses are 0 on the right side and 0 on the left side.       Posterior tibial pulses are 1+ on the right side and 0 on the left side.     Heart sounds: Normal heart sounds. No murmur heard.    Comments: Dressing intact on right foot second toe Pulmonary:     Effort: Pulmonary effort is normal.     Breath sounds: Normal breath sounds. No wheezing or rales.  Musculoskeletal:     Right lower leg: No edema.     Left lower leg: No edema.    Cardiac Studies:  Echocardiogram 03/15/2022:  1. Left ventricular ejection fraction, by estimation, is 55 to 60%. The  left ventricle has normal function. The left ventricle has no regional  wall motion abnormalities. There is moderate left ventricular hypertrophy.  Left ventricular diastolic  parameters are consistent with Grade II diastolic dysfunction  (pseudonormalization). Elevated left atrial pressure.   2. Right ventricular systolic function is normal. The right ventricular  size  is normal.   3. Left atrial size was severely dilated.   4. Right atrial size was moderately dilated.   5. A small pericardial effusion is present. The pericardial effusion is  posterior to the left ventricle.   6. The mitral valve is normal in structure. Mild mitral valve  regurgitation. No evidence of mitral stenosis.   7. The aortic valve is calcified. Aortic valve regurgitation is mild.  Aortic valve area, by VTI measures 1.21 cm. Aortic valve mean gradient  measures 17.0 mmHg. Aortic valve Vmax measures 2.62 m/s.   8. The inferior vena cava is normal in size with greater than 50%  respiratory variability, suggesting right atrial pressure of 3 mmHg.   Comparison(s): A prior study was performed on 09/15/2020. No significant  change from prior study.   PV intervention 03/14/2022: Rt TP trunk 80% stenosis Rt mid common peroneal artery 95% stenosis Prox occlusion of Rt ATA, Rt PT   Successful orbital atherectomy and PTA 3.0X60 mm balloon to Rt TP trunk and Rt mid common peroneal artery with 0% residual stenosis   Expect improvement in foot circulation and healing post amputation at the level of toe or transmetatarsal amputation. If difficulty healing noted, could then consider revascularization to Rt ATA with retrograde access  to Rt DP.                             Assessment & Recommendations:  70 y.o. African American male with hypertension, hyperlipidemia, type 2 DM, CAD, PAD, ESRD now on HD, h/o cardioversion for persistent Afib, small pericardial effusion, pulmonary hypertension, now admitted with fever and confusion, found to have right foot toe osteomyelitis, troponin elevation.   Troponin elevation: Likely secondary to ESRD, hypertension, ?cardiac arrest and chest compression a few days ago His current chest pain is musculoskeletal secondary to recent chest compressions Do not think this is ACS. Does not need systemic heparin for troponin elevation   CLI, Rt  foot osteomyelitis: S/p successful revascularization to Rt TP trunk, Rt common peroneal. This vessel partially collateralizes Rt DP and PT. I reckon this will provide adequate circulation for healing post amputation or Rt second toe. Should he have difficulty healing, we will then consider revascularization or proximally occluded Rt AT using retrograde access through Rt DP. Recommend plavix (along with eliquis for PAF) without Aspirin. Reported dark stools likely secondary to iron supplementation. Monitor for any Hb drop.  LEA duplex ordered.  Echocardiogram with intact LV function. Overall moderate cardiac risk for amputation, not prohibitive. Previous issues with anesthesia, need to be monitored closely.    ?Cardiac arrest: History unclear. Unclear what rhythm he had or whether it was merely hypotension during dialysis in a patient with active osteomyelitis. No arrhythmia on monitor other than occasional PVC. Monitor for now.    Discussed interpretation of tests and management recommendations with the primary team  Will arrange outpatient follow up.    Nigel Mormon, MD Pager: [phone] Office: [phone]

## 2022-03-15 NOTE — Plan of Care (Signed)
Spoke with patient to discuss treatment plan after speaking with Dr. Virgina Jock. Will plan for partial second toe amputation Friday afternoon. Discussed with patient due to skin viability may have to take all of toe and he expressed understanding. Patient to be NPO after midnight tonight.   Lorenda Peck, DPM

## 2022-03-15 NOTE — Progress Notes (Signed)
[facility] Progress Note   Subjective:   Patient seen in room, no c/o's today. 1 L w/ HD yesterday.   Objective Vitals:   03/14/22 2357 03/15/22 0342 03/15/22 0738 03/15/22 1111  BP: 135/61 (!) 129/51 (!) 129/59 (!) 171/68  Pulse: 69 66 65   Resp: '20 19 13 19  '$ Temp: 98.5 F (36.9 C) 98.5 F (36.9 C) 99.2 F (37.3 C)   TempSrc: Oral Oral Oral   SpO2: 97% 98% 94%   Weight:      Height:       Physical Exam General:well appearing male in NAD Heart:RRR, no mrg Lungs:CTAB, nml WOB on RA Abdomen:soft, NTND Extremities:no LE edema Dialysis Access: LU AVF in use     Dialysis Orders: SW MWF  4h  425/ 800  85.5kg  2/2 bath  LUA AVF  Heparin 5000 - last HD 5/15, 87.1 > 85.3kg - last Hb 12.2 on 5/10 - doxercalciferol 3 ug IV tiw    Assessment/ Plan: Osteomyelitis - of the R 2nd toe. Started on IV empiric abx.  ABI abnormal but noted as likely inaccurate due to patient movement. Cardio recommend LE CTA.  Elevated troponin w/ hx CAD - and recent brief arrest on HD last week 5/12. Cardiology consulted. Likley elevated d/t ESRD, pain felt to be MSK from compressions not ACS.  ESRD - on HD MWF. Had HD here yesterday, plan is for next HD tomorrow.  HTN - uncontrolled on admit, better now. Cont home coreg/ hydralazine.  Volume - euvolemic on exam, at dry wt. Try lowering dry wt w/ HD tomorrow.  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 Cont binders ac and IV vdra w/ HD.   Nutrition - Renal diet w/fluid restrictions.   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 On beta blockers    Kelly Splinter, MD 03/15/2022, 11:31 AM  Recent Labs  Lab 03/12/22 2202 03/12/22 2216 03/13/22 0336 03/13/22 2211 03/14/22 0458 03/15/22 0130  HGB 12.0*   < > 11.3*  --  9.7* 10.3*  ALBUMIN 3.5  --  3.1*  --   --   --   CALCIUM 8.0*  --  7.6*  --  8.0* 8.1*  PHOS  --   --   --  4.0  --   --   CREATININE 5.65*   < > 6.43*  --  8.11* 5.40*  K 3.6    < > 3.4*  --  3.8 3.5   < > = values in this interval not displayed.   Inpatient medications:  carvedilol  12.5 mg Oral BID   Chlorhexidine Gluconate Cloth  6 each Topical Q0600   clopidogrel  75 mg Oral Daily   doxercalciferol  3 mcg Intravenous Q M,W,F-HD   furosemide  80 mg Oral BID   gabapentin  300 mg Oral BID   hydrALAZINE  50 mg Oral BID   insulin aspart  0-6 Units Subcutaneous TID WC   isosorbide mononitrate  60 mg Oral QHS   sevelamer carbonate  800 mg Oral TID with meals   sodium chloride flush  3 mL Intravenous Q12H   sodium chloride flush  3 mL Intravenous Q12H   sucroferric oxyhydroxide  500 mg Oral TID   tamsulosin  0.4 mg Oral QHS   vitamin B-12  5,000 mcg Oral Daily    sodium chloride     sodium chloride  ceFEPime (MAXIPIME) IV 1 g (03/15/22 0118)   heparin 1,250 Units/hr (03/15/22 0948)   vancomycin Stopped (03/14/22 1213)   sodium chloride, sodium chloride, acetaminophen, alum & mag hydroxide-simeth, hydrALAZINE, labetalol, ondansetron (ZOFRAN) IV, sodium chloride flush, sodium chloride flush
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
## 2022-03-16 ENCOUNTER — Other Ambulatory Visit: Payer: Self-pay

## 2022-03-16 ENCOUNTER — Inpatient Hospital Stay (HOSPITAL_COMMUNITY): Payer: Medicare HMO | Admitting: Certified Registered"

## 2022-03-16 ENCOUNTER — Encounter (HOSPITAL_COMMUNITY)
Admission: EM | Disposition: A | Discharge status: Home Health | Payer: Self-pay | Source: Home / Self Care | Attending: Internal Medicine

## 2022-03-16 ENCOUNTER — Inpatient Hospital Stay (HOSPITAL_COMMUNITY): Payer: Medicare HMO

## 2022-03-16 ENCOUNTER — Telehealth: Payer: Self-pay | Admitting: *Deleted

## 2022-03-16 ENCOUNTER — Encounter (HOSPITAL_COMMUNITY): Payer: Self-pay | Admitting: Internal Medicine

## 2022-03-16 DIAGNOSIS — E1151 Type 2 diabetes mellitus with diabetic peripheral angiopathy without gangrene: Secondary | ICD-10-CM

## 2022-03-16 DIAGNOSIS — E1169 Type 2 diabetes mellitus with other specified complication: Secondary | ICD-10-CM

## 2022-03-16 DIAGNOSIS — I4891 Unspecified atrial fibrillation: Secondary | ICD-10-CM

## 2022-03-16 DIAGNOSIS — M869 Osteomyelitis, unspecified: Secondary | ICD-10-CM

## 2022-03-16 DIAGNOSIS — M86171 Other acute osteomyelitis, right ankle and foot: Secondary | ICD-10-CM | POA: Diagnosis not present

## 2022-03-16 DIAGNOSIS — M86671 Other chronic osteomyelitis, right ankle and foot: Secondary | ICD-10-CM

## 2022-03-16 HISTORY — PX: AMPUTATION TOE: SHX6595

## 2022-03-16 LAB — GLUCOSE, CAPILLARY
Glucose-Capillary: 122 mg/dL — ABNORMAL HIGH (ref 70–99)
Glucose-Capillary: 101 mg/dL — ABNORMAL HIGH (ref 70–99)
Glucose-Capillary: 98 mg/dL (ref 70–99)
Glucose-Capillary: 105 mg/dL — ABNORMAL HIGH (ref 70–99)
Glucose-Capillary: 106 mg/dL — ABNORMAL HIGH (ref 70–99)
Glucose-Capillary: 102 mg/dL — ABNORMAL HIGH (ref 70–99)
Glucose-Capillary: 94 mg/dL (ref 70–99)
Glucose-Capillary: 96 mg/dL (ref 70–99)
Glucose-Capillary: 96 mg/dL (ref 70–99)
Glucose-Capillary: 102 mg/dL — ABNORMAL HIGH (ref 70–99)
Glucose-Capillary: 95 mg/dL (ref 70–99)
Glucose-Capillary: 109 mg/dL — ABNORMAL HIGH (ref 70–99)
Glucose-Capillary: 143 mg/dL — ABNORMAL HIGH (ref 70–99)

## 2022-03-16 LAB — BASIC METABOLIC PANEL
Anion gap: 13 (ref 5–15)
BUN: 32 mg/dL — ABNORMAL HIGH (ref 8–23)
CO2: 26 mmol/L (ref 22–32)
Calcium: 8.3 mg/dL — ABNORMAL LOW (ref 8.9–10.3)
Chloride: 96 mmol/L — ABNORMAL LOW (ref 98–111)
Creatinine, Ser: 7.04 mg/dL — ABNORMAL HIGH (ref 0.61–1.24)
GFR, Estimated: 8 mL/min — ABNORMAL LOW (ref >60)
Glucose, Bld: 188 mg/dL — ABNORMAL HIGH (ref 70–99)
Potassium: 3.5 mmol/L (ref 3.5–5.1)
Sodium: 135 mmol/L (ref 135–145)

## 2022-03-16 LAB — CBC
HCT: 30.2 % — ABNORMAL LOW (ref 39.0–52.0)
Hemoglobin: 9.8 g/dL — ABNORMAL LOW (ref 13.0–17.0)
MCH: 30 pg (ref 26.0–34.0)
MCHC: 32.5 g/dL (ref 30.0–36.0)
MCV: 92.4 fL (ref 80.0–100.0)
Platelets: 164 10*3/uL (ref 150–400)
RBC: 3.27 MIL/uL — ABNORMAL LOW (ref 4.22–5.81)
RDW: 14.9 % (ref 11.5–15.5)
WBC: 5.4 10*3/uL (ref 4.0–10.5)
nRBC: 0 % (ref 0.0–0.2)

## 2022-03-16 LAB — POCT I-STAT, CHEM 8
BUN: 36 mg/dL — ABNORMAL HIGH (ref 8–23)
Calcium, Ion: 0.99 mmol/L — ABNORMAL LOW (ref 1.15–1.40)
Chloride: 97 mmol/L — ABNORMAL LOW (ref 98–111)
Creatinine, Ser: 8.3 mg/dL — ABNORMAL HIGH (ref 0.61–1.24)
Glucose, Bld: 93 mg/dL (ref 70–99)
HCT: 34 % — ABNORMAL LOW (ref 39.0–52.0)
Hemoglobin: 11.6 g/dL — ABNORMAL LOW (ref 13.0–17.0)
Potassium: 4 mmol/L (ref 3.5–5.1)
Sodium: 135 mmol/L (ref 135–145)
TCO2: 27 mmol/L (ref 22–32)

## 2022-03-16 LAB — HEPARIN LEVEL (UNFRACTIONATED): Heparin Unfractionated: 0.37 IU/mL (ref 0.30–0.70)

## 2022-03-16 SURGERY — AMPUTATION, TOE
Anesthesia: Monitor Anesthesia Care | Site: Toe | Laterality: Right

## 2022-03-16 MED ORDER — PNEUMOCOCCAL 20-VAL CONJ VACC 0.5 ML IM SUSY
0.5000 mL | PREFILLED_SYRINGE | INTRAMUSCULAR | Status: AC
  Administered 2022-03-17: 0.5 mL via INTRAMUSCULAR
  Filled 2022-03-16: qty 0.5

## 2022-03-16 MED ORDER — HEPARIN SODIUM (PORCINE) 1000 UNIT/ML DIALYSIS
2500.0000 [IU] | INTRAMUSCULAR | Status: DC | PRN
  Filled 2022-03-16: qty 3

## 2022-03-16 MED ORDER — FENTANYL CITRATE (PF) 250 MCG/5ML IJ SOLN
INTRAMUSCULAR | Status: AC
  Filled 2022-03-16: qty 5

## 2022-03-16 MED ORDER — CHLORHEXIDINE GLUCONATE CLOTH 2 % EX PADS
6.0000 | MEDICATED_PAD | Freq: Once | CUTANEOUS | Status: AC
  Administered 2022-03-16: 6 via TOPICAL

## 2022-03-16 MED ORDER — LIDOCAINE 2% (20 MG/ML) 5 ML SYRINGE
INTRAMUSCULAR | Status: AC
  Filled 2022-03-16: qty 5

## 2022-03-16 MED ORDER — HYDRALAZINE HCL 20 MG/ML IJ SOLN: 5.0000 mg | INTRAMUSCULAR | Status: DC | PRN

## 2022-03-16 MED ORDER — SODIUM CHLORIDE 0.9 % IV SOLN: INTRAVENOUS | Status: DC

## 2022-03-16 MED ORDER — BUPIVACAINE HCL 0.25 % IJ SOLN
INTRAMUSCULAR | Status: DC | PRN
  Administered 2022-03-16: 3 mL

## 2022-03-16 MED ORDER — BUPIVACAINE HCL (PF) 0.25 % IJ SOLN
INTRAMUSCULAR | Status: AC
  Filled 2022-03-16: qty 10

## 2022-03-16 MED ORDER — LIDOCAINE HCL 1 % IJ SOLN
INTRAMUSCULAR | Status: AC
  Filled 2022-03-16: qty 20

## 2022-03-16 MED ORDER — ORAL CARE MOUTH RINSE: 15.0000 mL | Freq: Once | OROMUCOSAL | Status: DC

## 2022-03-16 MED ORDER — CHLORHEXIDINE GLUCONATE 0.12 % MT SOLN
15.0000 mL | Freq: Once | OROMUCOSAL | Status: AC
  Administered 2022-03-16: 15 mL via OROMUCOSAL

## 2022-03-16 MED ORDER — HYDRALAZINE HCL 20 MG/ML IJ SOLN
5.0000 mg | Freq: Once | INTRAMUSCULAR | Status: AC
  Administered 2022-03-16: 5 mg via INTRAVENOUS

## 2022-03-16 MED ORDER — FENTANYL CITRATE (PF) 100 MCG/2ML IJ SOLN: 25.0000 ug | INTRAMUSCULAR | Status: DC | PRN

## 2022-03-16 MED ORDER — ORAL CARE MOUTH RINSE: 15.0000 mL | Freq: Once | OROMUCOSAL | Status: AC

## 2022-03-16 MED ORDER — INSULIN ASPART 100 UNIT/ML IJ SOLN: 0.0000 [IU] | INTRAMUSCULAR | Status: DC | PRN

## 2022-03-16 MED ORDER — LIDOCAINE HCL (PF) 1 % IJ SOLN
INTRAMUSCULAR | Status: DC | PRN
  Administered 2022-03-16: 3 mL via SUBCUTANEOUS

## 2022-03-16 MED ORDER — HYDRALAZINE HCL 20 MG/ML IJ SOLN
INTRAMUSCULAR | Status: AC
Start: 2022-03-16 — End: 2022-03-17
  Filled 2022-03-16: qty 1

## 2022-03-16 MED ORDER — PROPOFOL 500 MG/50ML IV EMUL
INTRAVENOUS | Status: DC | PRN
Start: 2022-03-16 — End: 2022-03-16
  Administered 2022-03-16: 150 ug/kg/min via INTRAVENOUS

## 2022-03-16 MED ORDER — 0.9 % SODIUM CHLORIDE (POUR BTL) OPTIME
TOPICAL | Status: DC | PRN
  Administered 2022-03-16: 1000 mL

## 2022-03-16 MED ORDER — CHLORHEXIDINE GLUCONATE 0.12 % MT SOLN: 15.0000 mL | Freq: Once | OROMUCOSAL | Status: DC

## 2022-03-16 MED ORDER — ONDANSETRON HCL 4 MG/2ML IJ SOLN
INTRAMUSCULAR | Status: AC
  Filled 2022-03-16: qty 2

## 2022-03-16 SURGICAL SUPPLY — 30 items
BAG COUNTER SPONGE SURGICOUNT (BAG) ×2 IMPLANT
BLADE LONG MED 31X9 (MISCELLANEOUS) ×2 IMPLANT
BNDG ELASTIC 4X5.8 VLCR STR LF (GAUZE/BANDAGES/DRESSINGS) ×2 IMPLANT
BNDG GAUZE ELAST 4 BULKY (GAUZE/BANDAGES/DRESSINGS) ×2 IMPLANT
COVER SURGICAL LIGHT HANDLE (MISCELLANEOUS) ×2 IMPLANT
DRSG ADAPTIC 3X8 NADH LF (GAUZE/BANDAGES/DRESSINGS) ×1 IMPLANT
DRSG EMULSION OIL 3X3 NADH (GAUZE/BANDAGES/DRESSINGS) ×2 IMPLANT
ELECT REM PT RETURN 9FT ADLT (ELECTROSURGICAL) ×2
ELECTRODE REM PT RTRN 9FT ADLT (ELECTROSURGICAL) ×1 IMPLANT
GAUZE SPONGE 4X4 12PLY STRL (GAUZE/BANDAGES/DRESSINGS) ×2 IMPLANT
GLOVE BIO SURGEON STRL SZ7.5 (GLOVE) ×2 IMPLANT
GLOVE BIOGEL PI IND STRL 8 (GLOVE) ×1 IMPLANT
GLOVE BIOGEL PI INDICATOR 8 (GLOVE) ×1
GOWN STRL REUS W/ TWL LRG LVL3 (GOWN DISPOSABLE) ×1 IMPLANT
GOWN STRL REUS W/ TWL XL LVL3 (GOWN DISPOSABLE) ×1 IMPLANT
GOWN STRL REUS W/TWL LRG LVL3 (GOWN DISPOSABLE) ×1
GOWN STRL REUS W/TWL XL LVL3 (GOWN DISPOSABLE) ×1
KIT BASIN OR (CUSTOM PROCEDURE TRAY) ×2 IMPLANT
KIT TURNOVER KIT B (KITS) ×2 IMPLANT
NDL HYPO 25GX1X1/2 BEV (NEEDLE) ×1 IMPLANT
NEEDLE HYPO 25GX1X1/2 BEV (NEEDLE) ×2 IMPLANT
NS IRRIG 1000ML POUR BTL (IV SOLUTION) ×1 IMPLANT
PACK ORTHO EXTREMITY (CUSTOM PROCEDURE TRAY) ×2 IMPLANT
PAD ARMBOARD 7.5X6 YLW CONV (MISCELLANEOUS) ×2 IMPLANT
SOL PREP POV-IOD 4OZ 10% (MISCELLANEOUS) ×2 IMPLANT
SUT ETHILON 3 0 PS 1 (SUTURE) ×2 IMPLANT
SYR CONTROL 10ML LL (SYRINGE) ×2 IMPLANT
TOWEL GREEN STERILE (TOWEL DISPOSABLE) ×2 IMPLANT
TUBE CONNECTING 12X1/4 (SUCTIONS) ×2 IMPLANT
YANKAUER SUCT BULB TIP NO VENT (SUCTIONS) ×2 IMPLANT

## 2022-03-16 NOTE — Plan of Care (Signed)

## 2022-03-16 NOTE — Transfer of Care (Signed)
Immediate Anesthesia Transfer of Care Note  Patient: [facility]  Procedure(s) Performed: AMPUTATION TOE, second (Right: Toe)  Patient Location: PACU  Anesthesia Type:MAC  Level of Consciousness: drowsy and patient cooperative  Airway & Oxygen Therapy: Patient Spontanous Breathing  Post-op Assessment: Report given to RN  Post vital signs: Reviewed and stable  Last Vitals:  Vitals Value Taken Time  BP 134/54 03/16/22 1727  Temp    Pulse 58 03/16/22 1727  Resp 13 03/16/22 1727  SpO2 92 % 03/16/22 1727  Vitals shown include unvalidated device data.  Last Pain:  Vitals:   03/16/22 1621  TempSrc:   PainSc: 0-No pain      Patients Stated Pain Goal: 0 (38/33/38 3291)  Complications: No notable events documented.

## 2022-03-16 NOTE — Anesthesia Procedure Notes (Signed)
Procedure Name: MAC Date/Time: 03/16/2022 4:51 PM Performed by: Barrington Ellison, CRNA Pre-anesthesia Checklist: Patient identified, Emergency Drugs available, Suction available, Patient being monitored and Timeout performed Patient Re-evaluated:Patient Re-evaluated prior to induction Oxygen Delivery Method: Simple face mask
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
## 2022-03-16 NOTE — Progress Notes (Signed)
[facility] Progress Note   Subjective:   pt seen in room. Upset about possibility of doing HD and surgery the same day.   Objective Vitals:   03/16/22 1300 03/16/22 1343 03/16/22 1346 03/16/22 1400  BP: (!) 155/62 (!) 155/64 (!) 155/64 (!) 159/64  Pulse: 63 68  69  Resp:      Temp:      TempSrc:      SpO2: 100% 100%  100%  Weight:      Height:       Physical Exam General:well appearing male in NAD Heart:RRR, no mrg Lungs:CTAB, nml WOB on RA Abdomen:soft, NTND Extremities:no LE edema Dialysis Access: LU AVF in use     Dialysis Orders: SW MWF  4h  425/ 800  85.5kg  2/2 bath  LUA AVF  Heparin 5000 - last HD 5/15, 87.1 > 85.3kg - last Hb 12.2 on 5/10 - doxercalciferol 3 ug IV tiw    Assessment/ Plan: Osteomyelitis - of the R 2nd toe. Started on IV empiric abx.  SP perc revasc of RLE by cardiology on 5/17. Anticipate partial 2nd toe amp today by podiatry.  Elevated troponin - w/ hx CAD, also recent brief arrest on HD last week 5/12. Cardiology consulted. Likley elevated d/t ESRD, pain felt to be MSK from compressions not ACS.  ESRD - on HD MWF. Will postpone HD until tomorrow due to surgery being scheduled for today. Vol/ lytes are okay.  HTN - uncontrolled initially, better now. Cont home coreg/ hydralazine.  Volume - euvolemic on exam, at dry wt. 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 Cont binders ac and IV vdra w/ HD.   Nutrition - Renal diet w/fluid restrictions.   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 On beta blockers    Kelly Splinter, MD 03/16/2022, 2:54 PM  Recent Labs  Lab 03/12/22 2202 03/12/22 2216 03/13/22 0336 03/13/22 2211 03/14/22 0458 03/15/22 0130 03/16/22 0052  HGB 12.0*   < > 11.3*  --    < > 10.3* 9.8*  ALBUMIN 3.5  --  3.1*  --   --   --   --   CALCIUM 8.0*  --  7.6*  --    < > 8.1* 8.3*  PHOS  --   --   --  4.0  --   --   --   CREATININE 5.65*   <  > 6.43*  --    < > 5.40* 7.04*  K 3.6   < > 3.4*  --    < > 3.5 3.5   < > = values in this interval not displayed.    Inpatient medications:  carvedilol  12.5 mg Oral BID   Chlorhexidine Gluconate Cloth  6 each Topical Q0600   clopidogrel  75 mg Oral Daily   doxercalciferol  3 mcg Intravenous Q M,W,F-HD   furosemide  80 mg Oral BID   gabapentin  300 mg Oral BID   hydrALAZINE  50 mg Oral BID   insulin aspart  0-6 Units Subcutaneous TID WC   isosorbide mononitrate  60 mg Oral QHS   [START ON 03/17/2022] pneumococcal 20-valent conjugate vaccine  0.5 mL Intramuscular Tomorrow-1000   sevelamer carbonate  800 mg Oral TID with meals   sodium chloride flush  3 mL Intravenous Q12H   sodium chloride flush  3 mL  Intravenous Q12H   sucroferric oxyhydroxide  500 mg Oral TID   tamsulosin  0.4 mg Oral QHS   vitamin B-12  5,000 mcg Oral Daily    sodium chloride     sodium chloride     ceFEPime (MAXIPIME) IV 200 mL/hr at 03/16/22 0400   heparin 1,300 Units/hr (03/16/22 0817)   vancomycin Stopped (03/14/22 1213)   sodium chloride, sodium chloride, acetaminophen, alum & mag hydroxide-simeth, [START ON 03/17/2022] heparin, labetalol, ondansetron (ZOFRAN) IV, sodium chloride flush, sodium chloride flush

## 2022-03-16 NOTE — Care Management Important Message (Signed)
Important Message  Patient Details  Name: William Webb MRN: [id number] Date of Birth: 12-Oct-1951   Medicare Important Message Given:  Yes     Ky Moskowitz 03/16/2022, 3:30 PM

## 2022-03-16 NOTE — Brief Op Note (Signed)
03/12/2022 - 03/16/2022  4:22 PM  PATIENT:  William Webb  71 y.o. male  PRE-OPERATIVE DIAGNOSIS:  osteomyelities of right second toe  POST-OPERATIVE DIAGNOSIS:  same   PROCEDURE:  Procedure(s) with comments: AMPUTATION TOE, second (Right) - surgical team will do block  SURGEON:  Surgeon(s) and Role:    * Lorenda Peck, DPM - Primary  PHYSICIAN ASSISTANT:   ASSISTANTS: none   ANESTHESIA:   none  EBL:  <10 ml   BLOOD ADMINISTERED:none  DRAINS: none   LOCAL MEDICATIONS USED:  MARCAINE   , LIDOCAINE , and Amount: 6 ml  SPECIMEN:  Source of Specimen:  right second digit for pathology, culture swab of right foot residual wound   DISPOSITION OF SPECIMEN:  PATHOLOGY as above.   COUNTS:  YES  TOURNIQUET:  * No tourniquets in log *  DICTATION: .Note written in [place]: Admit to inpatient   PATIENT DISPOSITION:  PACU - hemodynamically stable.   Delay start of Pharmacological VTE agent (>24hrs) due to surgical blood loss or risk of bleeding: yes

## 2022-03-16 NOTE — Op Note (Signed)
   OPERATIVE REPORT Patient name: William Webb MRN: [id number] DOB: September 13, 1951  DOS:  03/16/22  Preop Dx: Right second digit osteomyelitis Postop Dx: same  Procedure:  1. Right second digit partial amputation.   Surgeon: Lorenda Peck, DPM  Anesthesia: 50-50 mixture of 2% lidocaine plain with 0.5% Marcaine plain totaling 10 infiltrated in the patient's right lower extremity  Hemostasis: N/a  EBL: <10 mL Materials: None Injectables: as above  Pathology: right second digit for pathology and right second toe wound swab for culture.   Condition: The patient tolerated the procedure and anesthesia well. No complications noted or reported   Justification for procedure: The patient is a 71 y.o. male who presents today for right second digit osteomyelitis All conservative modalities of been unsuccessful in providing any sort of satisfactory alleviation of symptoms with the patient. The patient was told benefits as well as possible side effects of the surgery. The patient consented for surgical correction. The patient consent form was reviewed. All patient questions were answered. No guarantees were expressed or implied. The patient and the surgeon both signed the patient consent form with the witness present and placed in the patient's chart.   Procedure in Detail: The patient was brought to the operating room, placed in the operating table in the supine position at which time an aseptic scrub and drape were performed about the patient's respective lower extremity after anesthesia was induced as described above. Attention was then directed to the surgical area where procedure number one commenced.  Procedure #1:  Attention was directed to the right second digit were an incision was preformed encircling the base of the toe. Incision was made down to bone and digit was amputated at the level of the metatarsophalangeal join. After the toe was disarticulated it was passed off to the back  table to be sent to pathology for further evaluation. The extensor and flexor tendons were identified and resected as far proximally as possible. Any necrotic tissue was removed to healthy bleeding tissue. The metatarsal head was identified and noted to be hard. The area was irrigated copiously sterile saline and residual bone was sent to microbiology. Wound was swabbed after irrigation and sent for culture. Any bleeders noted were cauterized as necessary. The skin was re-approximated utilizing 3-0 nylon suture.    Dry sterile compressive dressings were then applied to all previously mentioned incision sites about the patient's lower extremity. All normal neurovascular responses including pink color and warmth returned all the digits of patient's lower extremity.  The patient was then transferred from the operating room to the recovery room having tolerated the procedure and anesthesia well. All vital signs are stable. After a brief stay in the recovery room the patient was transferred back to floor with adequate prescriptions for analgesia.   Patient is ok for discharge from podiatry standpoint. Will keep dressing clean dry and intact until follow-up in outpatient podiatry clinic.     Lorenda Peck, DPM [facility]  Dr. Lorenda Peck, DPM    [address] [place], [place] [address]                Office [phone]  Fax [phone]

## 2022-03-16 NOTE — Progress Notes (Signed)
Attempted bilateral lower extremity arterial duplex, however patient is going to the OR. Will attempt again as schedule permits.  03/16/2022 3:11 PM Kelby Aline., MHA, RVT, RDCS, RDMS

## 2022-03-16 NOTE — TOC Initial Note (Signed)
Transition of Care [facility]) - Initial/Assessment Note    Patient Details  Name: William Webb MRN: [id number] Date of Birth: 04/15/1951  [facility] CM/SW Contact:    Angelita Ingles, RN Phone Number:[phone]  03/16/2022, 11:45 AM  Clinical Narrative:                 TOC acknowledges that patient is high risk for readmission and will need TOC assessment. CM at bedside for assessment patient is in the middle of CHG bath and heading to surgery. TOC will revisit for assessment.          Patient Goals and CMS Choice        Expected Discharge Plan and Services                                                Prior Living Arrangements/Services                       Activities of Daily Living Home Assistive Devices/Equipment: Grab bars in shower, Shower chair with back, Wheelchair, Environmental consultant (specify type), Cane (specify quad or straight) ADL Screening (condition at time of admission) Patient's cognitive ability adequate to safely complete daily activities?: Yes Is the patient deaf or have difficulty hearing?: No Does the patient have difficulty seeing, even when wearing glasses/contacts?: No Does the patient have difficulty concentrating, remembering, or making decisions?: No Patient able to express need for assistance with ADLs?: Yes Does the patient have difficulty dressing or bathing?: No Independently performs ADLs?: Yes (appropriate for developmental age) Communication: Independent Dressing (OT): Independent Grooming: Independent Feeding: Independent Bathing: Independent Toileting: Independent In/Out Bed: Independent Walks in Home: Independent Does the patient have difficulty walking or climbing stairs?: No Weakness of Legs: None Weakness of Arms/Hands: None  Permission Sought/Granted                  Emotional Assessment              Admission diagnosis:  Osteomyelitis ([place]) [M86.9] Encephalopathy  [G93.40] Elevated troponin [R77.8] ESRD (end stage renal disease) on dialysis ([place]) [N18.6, Z99.2] SIRS (systemic inflammatory response syndrome) ([place]) [R65.10] Sepsis, due to unspecified organism, unspecified whether acute organ dysfunction present [facility]) [A41.9] Patient Active Problem List   Diagnosis Date Noted   Critical limb ischemia of right lower extremity ([place])    Acute osteomyelitis of toe, right ([place]) 03/13/2022   Osteomyelitis ([place]) 03/13/2022   Elevated troponin    SIRS (systemic inflammatory response syndrome) ([place]) 03/12/2022   ESRD (end stage renal disease) on dialysis ([place]) 10/19/2021   Community acquired pneumonia 02/01/2021   PNA (pneumonia) 02/01/2021   Hypoxia    Pleural effusion on right 12/02/2020   Pericardial effusion 11/02/2020   Loss of consciousness ([place]) 09/16/2020   Syncope and collapse 09/15/2020   CKD (chronic kidney disease), stage IV ([place]) 08/11/2020   Acute kidney injury superimposed on CKD ([place]) 07/31/2020   Hyperlipidemia associated with type 2 diabetes mellitus ([place]) 07/31/2020   Paroxysmal atrial fibrillation ([place]) 07/31/2020   Anemia of chronic disease 07/31/2020   Nausea and vomiting 07/31/2020   PAD (peripheral artery disease) ([place]) 03/19/2020   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, limited to breakdown of skin ([place]) 01/08/2020   Status post vascular surgery 12/07/2019   Respiratory failure ([place]) 12/07/2019  Persistent atrial fibrillation ([place]) 07/10/2019   H/O: GI bleed 07/10/2019   CKD (chronic kidney disease) stage 5, GFR less than 15 ml/min (HCC) 06/08/2019   Erectile dysfunction 05/06/2019   Coronary artery disease involving native coronary artery of native heart without angina pectoris 04/05/2019   Acute on chronic respiratory failure with hypoxia ([place]) 12/10/2018   Healthcare-associated pneumonia 12/10/2018   Hemoptysis 12/10/2018   Sepsis ([place]) 12/10/2018   Acute respiratory failure with hypoxia ([place]) 12/10/2018    Malnutrition of moderate degree 12/02/2018   CAD (coronary artery disease) 12/01/2018   DOE (dyspnea on exertion) 11/18/2018   Hypertension associated with diabetes ([place])    Hyperlipidemia    Diabetes mellitus with renal complications ([place])    Noncompliance with medication regimen    Chronic pain syndrome 05/29/2018   Type 2 diabetes mellitus with stage 3 chronic kidney disease, with long-term current use of insulin ([place]) 04/07/2018   Stage 3a chronic kidney disease ([place]) 03/11/2018   Fall 11/20/2017   AKI (acute kidney injury) ([place]) 11/20/2017   Normocytic normochromic anemia 11/20/2017   Hypoglycemia 11/19/2017   Essential hypertension 11/19/2017   PCP:  Collene Leyden, MD Pharmacy:   [id number] [place] [place] [id number]: [phone]: [phone]    Social Determinants of Health (SFlasher Interventions    Readmission Risk Interventions    12/11/2019    3:51 PM  Readmission Risk Prevention Plan  Transportation Screening Complete  PCP or Specialist Appt within 5-7 Days Complete  Home Care Screening Complete  Medication Review (RN CM) Complete

## 2022-03-16 NOTE — Telephone Encounter (Signed)
Spoke to his nurse about this and they will keep him on fluids.

## 2022-03-16 NOTE — Progress Notes (Signed)
Orthopedic Tech Progress Note Patient Details:  William Webb [facility] October 24, 1951 [id number]  Ortho Devices Type of Ortho Device: Postop shoe/boot Ortho Device/Splint Location: right Ortho Device/Splint Interventions: Ordered, Application   Post Interventions Patient Tolerated: Well Instructions Provided: Adjustment of device  Charline Bills [name] 03/16/2022, 6:11 PM Delivered and applied post op shoe to check for correct size.

## 2022-03-16 NOTE — Telephone Encounter (Signed)
Patient's daughter has been notified as well.

## 2022-03-16 NOTE — Anesthesia Preprocedure Evaluation (Addendum)
Anesthesia Evaluation  Patient identified by MRN, date of birth, ID band Patient awake    Reviewed: Allergy & Precautions, NPO status , Patient's Chart, lab work & pertinent test results  Airway Mallampati: III  TM Distance: >3 FB Neck ROM: Full    Dental  (+) Dental Advisory Given   Pulmonary pneumonia, former smoker,    breath sounds clear to auscultation       Cardiovascular hypertension, Pt. on home beta blockers and Pt. on medications + CAD, + Past MI, + Peripheral Vascular Disease, +CHF and + DOE  + dysrhythmias Atrial Fibrillation  Rhythm:Regular Rate:Normal  TTE 2023 1. Left ventricular ejection fraction, by estimation, is 55 to 60%. The  left ventricle has normal function. The left ventricle has no regional  wall motion abnormalities. There is moderate left ventricular hypertrophy.  Left ventricular diastolic  parameters are consistent with Grade II diastolic dysfunction  (pseudonormalization). Elevated left atrial pressure.  2. Right ventricular systolic function is normal. The right ventricular  size is normal.  3. Left atrial size was severely dilated.  4. Right atrial size was moderately dilated.  5. A small pericardial effusion is present. The pericardial effusion is  posterior to the left ventricle.  6. The mitral valve is normal in structure. Mild mitral valve  regurgitation. No evidence of mitral stenosis.  7. The aortic valve is calcified. Aortic valve regurgitation is mild.  Aortic valve area, by VTI measures 1.21 cm. Aortic valve mean gradient  measures 17.0 mmHg. Aortic valve Vmax measures 2.62 m/s.  8. The inferior vena cava is normal in size with greater than 50%  respiratory variability, suggesting right atrial pressure of 3 mmHg.   Neuro/Psych negative neurological ROS  negative psych ROS   GI/Hepatic Neg liver ROS, GERD  ,Lab Results      Component                Value               Date                       CREATININE               7.04 (H)            03/16/2022                BUN                      32 (H)              03/16/2022                NA                       135                 03/16/2022                K                        3.5                 03/16/2022                CL  96 (L)              03/16/2022                CO2                      26                  03/16/2022              Endo/Other  diabetes  Renal/GU ESRF and DialysisRenal disease  negative genitourinary   Musculoskeletal  (+) Arthritis , Fibromyalgia -  Abdominal   Peds  Hematology  (+) Blood dyscrasia (on plavix), anemia ,   Anesthesia Other Findings   Reproductive/Obstetrics                         Lab Results  Component Value Date   WBC 5.4 03/16/2022   HGB 9.8 (L) 03/16/2022   HCT 30.2 (L) 03/16/2022   MCV 92.4 03/16/2022   PLT 164 03/16/2022   Lab Results  Component Value Date   CREATININE 7.04 (H) 03/16/2022   BUN 32 (H) 03/16/2022   NA 135 03/16/2022   K 3.5 03/16/2022   CL 96 (L) 03/16/2022   CO2 26 03/16/2022    Anesthesia Physical Anesthesia Plan  ASA: 3  Anesthesia Plan: MAC   Post-op Pain Management: Tylenol PO (pre-op)* and Minimal or no pain anticipated   Induction: Intravenous  PONV Risk Score and Plan: 1 and Propofol infusion and Treatment may vary due to age or medical condition  Airway Management Planned: Natural Airway and Simple Face Mask  Additional Equipment:   Intra-op Plan:   Post-operative Plan:   Informed Consent: I have reviewed the patients History and Physical, chart, labs and discussed the procedure including the risks, benefits and alternatives for the proposed anesthesia with the patient or authorized representative who has indicated his/her understanding and acceptance.     Dental advisory given  Plan Discussed with: CRNA  Anesthesia Plan Comments:         Anesthesia Quick Evaluation

## 2022-03-16 NOTE — Interval H&P Note (Signed)
History and Physical Interval Note:  03/16/2022 4:20 PM  William Webb  has presented today for surgery, with the diagnosis of osteomyelities of right second toe.  The various methods of treatment have been discussed with the patient and family. After consideration of risks, benefits and other options for treatment, the patient has consented to  Procedure(s) with comments: AMPUTATION TOE, second (Right) - surgical team will do block as a surgical intervention.  The patient's history has been reviewed, patient examined, no change in status, stable for surgery.  I have reviewed the patient's chart and labs.  Questions were answered to the patient's satisfaction.     Lorenda Peck

## 2022-03-16 NOTE — Telephone Encounter (Signed)
Patient's daughter is calling with concerns about and his blood sugars is dropping down  into the 90's,if not having anything to eat, usually crashes under 100. Is there something that they can give him to help keep his levels above 100. What can they do? please advise.

## 2022-03-16 NOTE — Progress Notes (Signed)
PROGRESS NOTE William Webb [facility]  VFI:[id number] DOB: Sep 28, 1951 DOA: 03/12/2022 PCP: Collene Leyden, MD   Brief Narrative/Hospital Course:  71 y.o. male with history of ESRD on hemodialysis, CAD, peripheral artery disease, diabetes mellitus, atrial fibrillation was brought to the ER after patient was found missing by patient's family.  Patient states he was driving out of the dialysis and was trying to get some food.  He does not remember being confused. Patient's family found patient in the driving lot sitting inside the car and was brought to the ER. During last dialysis session prior to 5/15 he briefly had a CODE and was briefly resuscitated at the time patient refused to come to the ER.  High sensitive troponin was 107, 117.  EKG shows normal sinus rhythm.  Had blood cultures drawn.  X-rays of the right foot was done because there was some discharge coming from the right second toe which shows features concerning for osteomyelitis.  Antibiotics have been started.  Patient was admitted for further work-up.  Cardiology podiatry and nephrology consulted    Subjective: Seen and examined this morning.  Upset about n.p.o. blood sugar in 100 RN trying to contact surgeon if okay to give juice otherwise can start dextrose ivf per pt request. Overnight patient was afebrile Tmax 99.2 on nasal cannula BP 150s to 190s Labs showed a stable anemia, 9-10 gm, no leukocytosis  Assessment and Plan: Principal Problem:   Acute osteomyelitis of toe, right (HCC) Active Problems:   Diabetes mellitus with renal complications (HCC)   Persistent atrial fibrillation (HCC)   ESRD (end stage renal disease) on dialysis ([place])   SIRS (systemic inflammatory response syndrome) ([place])   Osteomyelitis ([place])   Critical limb ischemia of right lower extremity (HCC)   Acute osteomyelitis of second toe of the right foot on empiric antibiotics PVD: S/P successful right TP trunk and medial RT common peroneal artery  revascularization by Dr. Virgina Jock 5/17.  Plavix started 5/18.Dr. Blenda Mounts planning for partial second amputation Friday afternoon.  For long-term we will continue Eliquis/Plavix without aspirin.Continue vancomycin/cefepime for now.  Elevated troponin History of CAD Recent brief ?cardiac arrest in HD 5/12:  Cardiology following closely-Echo with intact LV function overall moderate cardiac risk for amputation but not prohibitive as per cardiology.  Patient on statin Plavix,Imdur,Coreg 12.5 as per cardio  Acute metabolic encephalopathy likely from osteomyelitis/infection in the setting of multiple comorbidities.  Mental status improved and stable.   ESRD on HD MWF: Nephrology on board for dialysis.  Nephrology following closely for dialysis. MBD CKD: Calcium post stable at goal continue binders Anemia of ESRD: Hemoglobin overall stable,Monitor. ESA not due until 5/24 Recent Labs  Lab 03/12/22 2216 03/13/22 0336 03/14/22 0458 03/15/22 0130 03/16/22 0052  HGB 13.3 11.3* 9.7* 10.3* 9.8*  HCT 39.0 34.5* 29.8* 31.3* 30.2*    Diabetes mellitus with renal complications, blood sugar is stable/controlled glucose level, keep on SSI.  Patient concerned about blood sugar running in 90s to 100 and n.p.o.-adamant on taking juice or Insil canceled the surgery-consider dextrose low rate Recent Labs  Lab 03/15/22 1638 03/15/22 2209 03/16/22 0607 03/16/22 1017 03/16/22 1110  GLUCAP 134* 153* 122* 101* 98    Persistent atrial fibrillation: Rate controlled.  Continue Coreg, Eliquis converted to heparin perioperatively  Hypertension urgency Hypertension: blood pressure remains poorly controlled on Imdur hydralazine Lasix Coreg, will continue to monitor and adjust, cardiology managing appreciate input  DVT prophylaxis: SCD's Start: 03/16/22 1008 SCD's Start: 03/14/22 1940 heparin drip Code Status:  Code Status: Full Code Family Communication: plan of care discussed with patient at bedside.  Daughter  was on the phone listening to the conversation. Patient status is: Inpatient because of ongoing management of osteomyelitis Level of care: Progressive Cardiac   Dispo: The patient is from: home            Anticipated disposition: Home 2-3 days in  Mobility Assessment (last 72 hours)     Mobility Assessment     Row Name 03/16/22 0800 03/16/22 0737 03/15/22 1700 03/15/22 1200 03/15/22 0738   Does patient have an order for bedrest or is patient medically unstable No - Continue assessment No - Continue assessment No - Continue assessment No - Continue assessment No - Continue assessment   What is the highest level of mobility based on the progressive mobility assessment? Level 5 (Walks with assist in room/hall) - Balance while stepping forward/back and can walk in room with assist - Complete Level 5 (Walks with assist in room/hall) - Balance while stepping forward/back and can walk in room with assist - Complete Level 5 (Walks with assist in room/hall) - Balance while stepping forward/back and can walk in room with assist - Complete Level 5 (Walks with assist in room/hall) - Balance while stepping forward/back and can walk in room with assist - Complete Level 5 (Walks with assist in room/hall) - Balance while stepping forward/back and can walk in room with assist - Complete    Row Name 03/14/22 0800 03/13/22 1850         Does patient have an order for bedrest or is patient medically unstable No - Continue assessment No - Continue assessment      What is the highest level of mobility based on the progressive mobility assessment? Level 5 (Walks with assist in room/hall) - Balance while stepping forward/back and can walk in room with assist - Complete Level 5 (Walks with assist in room/hall) - Balance while stepping forward/back and can walk in room with assist - Complete                Objective: Vitals last 24 hrs: Vitals:   03/16/22 0736 03/16/22 0737 03/16/22 0800 03/16/22 1018  BP: (!)  161/64  (!) 160/62 (!) 170/68  Pulse: 64 64 64   Resp:  20    Temp:  98 F (36.7 C)    TempSrc:  Oral    SpO2: 99% 99% 100%   Weight:      Height:       Weight change:   Physical Examination: General exam: AAox3,older than stated age, weak appearing. HEENT:Oral mucosa moist, Ear/Nose WNL grossly, dentition normal. Respiratory system: bilaterally diminished,no use of accessory muscle Cardiovascular system: S1 & S2 +, No JVD,. Gastrointestinal system: Abdomen soft,NT,ND, BS+ Nervous System:Alert, awake, moving extremities and grossly nonfocal Extremities: Mild edema right lower extremity warm and perfused with wound on the right second toe  Skin: No rashes,no icterus. MSK: Normal muscle bulk,tone, power   Medications reviewed:  Scheduled Meds:  carvedilol  12.5 mg Oral BID   Chlorhexidine Gluconate Cloth  6 each Topical Q0600   clopidogrel  75 mg Oral Daily   doxercalciferol  3 mcg Intravenous Q M,W,F-HD   furosemide  80 mg Oral BID   gabapentin  300 mg Oral BID   hydrALAZINE  50 mg Oral BID   insulin aspart  0-6 Units Subcutaneous TID WC   isosorbide mononitrate  60 mg Oral QHS   [START ON 03/17/2022] pneumococcal 20-valent  conjugate vaccine  0.5 mL Intramuscular Tomorrow-1000   sevelamer carbonate  800 mg Oral TID with meals   sodium chloride flush  3 mL Intravenous Q12H   sodium chloride flush  3 mL Intravenous Q12H   sucroferric oxyhydroxide  500 mg Oral TID   tamsulosin  0.4 mg Oral QHS   vitamin B-12  5,000 mcg Oral Daily   Continuous Infusions:  sodium chloride     sodium chloride     ceFEPime (MAXIPIME) IV 200 mL/hr at 03/16/22 0400   heparin 1,300 Units/hr (03/16/22 0817)   vancomycin Stopped (03/14/22 1213)      Diet Order             Diet NPO time specified  Diet effective midnight                            Intake/Output Summary (Last 24 hours) at 03/16/2022 1117 Last data filed at 03/16/2022 0800 Gross per 24 hour  Intake 996.88 ml   Output --  Net 996.88 ml   Net IO Since Admission: 1,079.18 mL [03/16/22 1117]  Wt Readings from Last 3 Encounters:  03/14/22 85.7 kg  12/05/21 84.3 kg  10/19/21 86.6 kg     Unresulted Labs (From admission, onward)     Start     Ordered   03/16/22 0500  Heparin level (unfractionated)  Daily,   R     Question:  Specimen collection method  Answer:  Lab=Lab collect   03/14/22 2242   03/15/22 3790  Basic metabolic panel  Daily,   R     Question:  Specimen collection method  Answer:  Lab=Lab collect   03/14/22 1320   03/14/22 0500  CBC  Daily,   R      03/13/22 0322          Data Reviewed: I have personally reviewed following labs and imaging studies CBC: Recent Labs  Lab 03/12/22 2202 03/12/22 2216 03/13/22 0336 03/14/22 0458 03/15/22 0130 03/16/22 0052  WBC 10.5  --  8.6 6.3 5.8 5.4  NEUTROABS 9.1*  --   --   --   --   --   HGB 12.0* 13.3 11.3* 9.7* 10.3* 9.8*  HCT 36.7* 39.0 34.5* 29.8* 31.3* 30.2*  MCV 93.9  --  93.2 92.3 92.3 92.4  PLT 190  --  168 175 170 240   Basic Metabolic Panel: Recent Labs  Lab 03/12/22 2202 03/12/22 2216 03/13/22 0336 03/13/22 2211 03/14/22 0458 03/15/22 0130 03/16/22 0052  NA 138 138 136  --  134* 136 135  K 3.6 3.6 3.4*  --  3.8 3.5 3.5  CL 94* 93* 94*  --  94* 98 96*  CO2 29  --  31  --  '28 30 26  '$ GLUCOSE 100* 98 182*  --  115* 128* 188*  BUN 23 22 28*  --  38* 20 32*  CREATININE 5.65* 6.30* 6.43*  --  8.11* 5.40* 7.04*  CALCIUM 8.0*  --  7.6*  --  8.0* 8.1* 8.3*  PHOS  --   --   --  4.0  --   --   --    GFR: Estimated Creatinine Clearance: 11.3 mL/min (A) (by C-G formula based on SCr of 7.04 mg/dL (H)). Liver Function Tests: Recent Labs  Lab 03/12/22 2202 03/13/22 0336  AST 15 18  ALT 11 10  ALKPHOS 98 83  BILITOT 1.3* 0.8  PROT 8.2*  7.3  ALBUMIN 3.5 3.1*   No results for input(s): LIPASE, AMYLASE in the last 168 hours. No results for input(s): AMMONIA in the last 168 hours. Coagulation Profile: Recent Labs   Lab 03/12/22 2202  INR 1.3*   BNP (last 3 results) No results for input(s): PROBNP in the last 8760 hours. HbA1C: No results for input(s): HGBA1C in the last 72 hours. CBG: Recent Labs  Lab 03/15/22 1638 03/15/22 2209 03/16/22 0607 03/16/22 1017 03/16/22 1110  GLUCAP 134* 153* 122* 101* 98   Lipid Profile: Recent Labs    03/15/22 0130  CHOL 139  HDL 27*  LDLCALC 87  TRIG 127  CHOLHDL 5.1   Thyroid Function Tests: No results for input(s): TSH, T4TOTAL, FREET4, T3FREE, THYROIDAB in the last 72 hours. Sepsis Labs: Recent Labs  Lab 03/12/22 2202  LATICACIDVEN 1.1    Recent Results (from the past 240 hour(s))  Blood Culture (routine x 2)     Status: None (Preliminary result)   Collection Time: 03/12/22 10:02 PM   Specimen: BLOOD  Result Value Ref Range Status   Specimen Description   Final    BLOOD BLOOD RIGHT FOREARM Performed at [facility] [address], [place], [place] [address]    Special Requests   Final    BOTTLES DRAWN AEROBIC AND ANAEROBIC Blood Culture adequate volume Performed at [facility] [address], [place], [place] [address]    Culture   Final    NO GROWTH 3 DAYS Performed at [facility], [place] [address] [place],  [address]    Report Status PENDING  Incomplete  Resp Panel by RT-PCR (Flu A&B, Covid) Nasopharyngeal Swab     Status: None   Collection Time: 03/12/22 10:53 PM   Specimen: Nasopharyngeal Swab; Nasopharyngeal(NP) swabs in vial transport medium  Result Value Ref Range Status   SARS Coronavirus 2 by RT PCR NEGATIVE NEGATIVE Final    Comment: (NOTE) SARS-CoV-2 target nucleic acids are NOT DETECTED.  The SARS-CoV-2 RNA is generally detectable in upper respiratory specimens during the acute phase of infection. The lowest concentration of SARS-CoV-2 viral copies this assay can detect is 138 copies/mL. A negative result does not preclude SARS-Cov-2 infection and should not  be used as the sole basis for treatment or other patient management decisions. A negative result may occur with  improper specimen collection/handling, submission of specimen other than nasopharyngeal swab, presence of viral mutation(s) within the areas targeted by this assay, and inadequate number of viral copies(<138 copies/mL). A negative result must be combined with clinical observations, patient history, and epidemiological information. The expected result is Negative.  Fact Sheet for Patients:  EntrepreneurPulse.com.au  Fact Sheet for Healthcare Providers:  IncredibleEmployment.be  This test is no t yet approved or cleared by the [place] FDA and  has been authorized for detection and/or diagnosis of SARS-CoV-2 by FDA under an Emergency Use Authorization (EUA). This EUA will remain  in effect (meaning this test can be used) for the duration of the COVID-19 declaration under Section 564(b)(1) of the Act, 21 U.S.C.section 360bbb-3(b)(1), unless the authorization is terminated  or revoked sooner.       Influenza A by PCR NEGATIVE NEGATIVE Final   Influenza B by PCR NEGATIVE NEGATIVE Final    Comment: (NOTE) The Xpert Xpress SARS-CoV-2/FLU/RSV plus assay is intended as an aid in the diagnosis of influenza from Nasopharyngeal swab specimens and should not be used as a sole basis for treatment. Nasal  washings and aspirates are unacceptable for Xpert Xpress SARS-CoV-2/FLU/RSV testing.  Fact Sheet for Patients: EntrepreneurPulse.com.au  Fact Sheet for Healthcare Providers: IncredibleEmployment.be  This test is not yet approved or cleared by the [place] FDA and has been authorized for detection and/or diagnosis of SARS-CoV-2 by FDA under an Emergency Use Authorization (EUA). This EUA will remain in effect (meaning this test can be used) for the duration of the COVID-19 declaration under Section  564(b)(1) of the Act, 21 U.S.C. section 360bbb-3(b)(1), unless the authorization is terminated or revoked.  Performed at [facility], [place] [address], [place], [place] [address]   Blood Culture (routine x 2)     Status: None (Preliminary result)   Collection Time: 03/12/22 11:30 PM   Specimen: BLOOD  Result Value Ref Range Status   Specimen Description   Final    BLOOD BLOOD RIGHT ARM Performed at [facility] [address] [place], [place] [address]    Special Requests   Final    BOTTLES DRAWN AEROBIC AND ANAEROBIC Blood Culture adequate volume Performed at [facility] [address] [place], [place] [address]    Culture   Final    NO GROWTH 3 DAYS Performed at [facility], [place] [address], [place], [place] [address]    Report Status PENDING  Incomplete  MRSA Next Gen by PCR, Nasal     Status: None   Collection Time: 03/14/22  7:30 PM   Specimen: Nasal Mucosa; Nasal Swab  Result Value Ref Range Status   MRSA by PCR Next Gen NOT DETECTED NOT DETECTED Final    Comment: (NOTE) The GeneXpert MRSA Assay (FDA approved for NASAL specimens only), is one component of a comprehensive MRSA colonization surveillance program. It is not intended to diagnose MRSA infection nor to guide or monitor treatment for MRSA infections. Test performance is not FDA approved in patients less than 30 years old. Performed at [facility], [place] [address] [place], [place] [address]     Antimicrobials: Anti-infectives (From admission, onward)    Start     Dose/Rate Route Frequency Ordered Stop   03/14/22 1200  vancomycin (VANCOCIN) IVPB 1000 mg/200 mL premix        1,000 mg 200 mL/hr over 60 Minutes Intravenous Every M-W-F (Hemodialysis) 03/13/22 0941     03/13/22 2300  ceFEPIme (MAXIPIME) 1 g in sodium chloride 0.9 % 100 mL IVPB        1 g 200 mL/hr over 30 Minutes Intravenous Every 24 hours 03/13/22 0938     03/13/22 0342   vancomycin variable dose per unstable renal function (pharmacist dosing)  Status:  Discontinued         Does not apply See admin instructions 03/13/22 0343 03/13/22 0941   03/13/22 0000  ceFEPIme (MAXIPIME) 2 g in sodium chloride 0.9 % 100 mL IVPB  Status:  Discontinued        2 g 200 mL/hr over 30 Minutes Intravenous Every 24 hours 03/12/22 2256 03/13/22 0938   03/12/22 2300  vancomycin (VANCOCIN) IVPB 1000 mg/200 mL premix        1,000 mg 200 mL/hr over 60 Minutes Intravenous  Once 03/12/22 2253 03/13/22 0132      Culture/Microbiology    Component Value Date/Time   SDES  03/12/2022 2330    BLOOD BLOOD RIGHT ARM Performed at [facility], [place] [address], [place], [place] [address]    SPECREQUEST  03/12/2022 2330  BOTTLES DRAWN AEROBIC AND ANAEROBIC Blood Culture adequate volume Performed at [facility] [address] [place], [place] [address]    CULT  03/12/2022 2330    NO GROWTH 3 DAYS Performed at [facility] [address] [place], [place] [address]    REPTSTATUS PENDING 03/12/2022 2330    Other culture-see note  Radiology Studies: PERIPHERAL VASCULAR CATHETERIZATION  Addendum Date: 03/14/2022     Prox R ATA to Dist R ATA lesion is 100% stenosed.   Mid R TP Trunk to Prox R Peroneal lesion is 80% stenosed.   Mid R Peroneal lesion is 95% stenosed.   Ost R PTA to Dist R PTA lesion is 100% stenosed.   Prox R SFA lesion is 30% stenosed.   Mid R SFA to Dist R SFA lesion is 30% stenosed.   Balloon angioplasty was performed.   Balloon angioplasty was performed.   Post intervention, there is a 0% residual stenosis.   Post intervention, there is a 0% residual stenosis. Rt TP trunk 80% stenosis Rt mid common peroneal artery 95% stenosis Prox occlusion of Rt ATA, Rt PT Successful orbital atherectomy and PTA 3.0X60 mm balloon to Rt TP trunk and Rt mid common peroneal artery with 0% residual stenosis Expect improvement in foot circulation  and healing post amputation at the level of toe or transmetatarsal amputation. If difficulty healing noted, could then consider revascularization to Rt ATA with retrograde access to Rt DP.    Nigel Mormon, MD Pager: [phone] Office: [phone]  Addendum Date: 03/14/2022     Prox R ATA to Dist R ATA lesion is 100% stenosed.   Mid R TP Trunk to Prox R Peroneal lesion is 80% stenosed.   Mid R Peroneal lesion is 95% stenosed.   Ost R PTA to Dist R PTA lesion is 100% stenosed.   Prox R SFA lesion is 30% stenosed.   Mid R SFA to Dist R SFA lesion is 30% stenosed.   Balloon angioplasty was performed.   Balloon angioplasty was performed.   Post intervention, there is a 0% residual stenosis.   Post intervention, there is a 0% residual stenosis. Rt TP trunk 80% stenosis Rt mid common peroneal artery 95% stenosis Prox occlusion of Rt ATA, Rt PT Successful orbital atherectomy and PTA 3.0X60 mm balloon to Rt TP trunk and Rt mid common peroneal artery with 0% residual stenosis Expect improvement in foot circulation and healing post amputation at the level of toe or transmetatarsal amputation. If difficulty healing noted, could then consider revascularization to Rt ATA with retrograde access to Rt DP. Nigel Mormon, MD Pager: [phone] Office: [phone]   Result Date: 03/14/2022   Prox R ATA to Dist R ATA lesion is 100% stenosed.   Mid R TP Trunk to Prox R Peroneal lesion is 80% stenosed.   Mid R Peroneal lesion is 95% stenosed.   Ost R PTA to Dist R PTA lesion is 100% stenosed.   Prox R SFA lesion is 30% stenosed.   Mid R SFA to Dist R SFA lesion is 30% stenosed.   Balloon angioplasty was performed.   Balloon angioplasty was performed.   Post intervention, there is a 0% residual stenosis.   Post intervention, there is a 0% residual stenosis.   ECHOCARDIOGRAM COMPLETE  Result Date: 03/15/2022    ECHOCARDIOGRAM REPORT   Patient Name:   William Webb Date of Exam: 03/15/2022 Medical Rec #:   [id number]  Height:       75.0 in Accession #:    [id number]             Weight:       188.9 lb Date of Birth:  1951-05-18              BSA:          2.142 m Patient Age:    89 years               BP:           171/68 mmHg Patient Gender: M                      HR:           74 bpm. Exam Location:  Inpatient Procedure: 2D Echo, Color Doppler and Cardiac Doppler Indications:     Elevated troponin  History:         Patient has prior history of Echocardiogram examinations, most                  recent 11/08/2020. Arrythmias:Atrial Fibrillation; Risk                  Factors:Diabetes, Hypertension, Dyslipidemia and Former Smoker.                  ESRD.  Sonographer:     Clayton Lefort RDCS (AE) Referring Phys:  [id number] [facility] Diagnosing Phys: Vernell Leep MD IMPRESSIONS  1. Left ventricular ejection fraction, by estimation, is 55 to 60%. The left ventricle has normal function. The left ventricle has no regional wall motion abnormalities. There is moderate left ventricular hypertrophy. Left ventricular diastolic parameters are consistent with Grade II diastolic dysfunction (pseudonormalization). Elevated left atrial pressure.  2. Right ventricular systolic function is normal. The right ventricular size is normal.  3. Left atrial size was severely dilated.  4. Right atrial size was moderately dilated.  5. A small pericardial effusion is present. The pericardial effusion is posterior to the left ventricle.  6. The mitral valve is normal in structure. Mild mitral valve regurgitation. No evidence of mitral stenosis.  7. The aortic valve is calcified. Aortic valve regurgitation is mild. Aortic valve area, by VTI measures 1.21 cm. Aortic valve mean gradient measures 17.0 mmHg. Aortic valve Vmax measures 2.62 m/s.  8. The inferior vena cava is normal in size with greater than 50% respiratory variability, suggesting right atrial pressure of 3 mmHg. Comparison(s): A prior study was performed on  09/15/2020. No significant change from prior study. FINDINGS  Left Ventricle: Left ventricular ejection fraction, by estimation, is 55 to 60%. The left ventricle has normal function. The left ventricle has no regional wall motion abnormalities. The left ventricular internal cavity size was normal in size. There is  moderate left ventricular hypertrophy. Left ventricular diastolic parameters are consistent with Grade II diastolic dysfunction (pseudonormalization). Elevated left atrial pressure. Right Ventricle: The right ventricular size is normal. No increase in right ventricular wall thickness. Right ventricular systolic function is normal. Left Atrium: Left atrial size was severely dilated. Right Atrium: Right atrial size was moderately dilated. Pericardium: A small pericardial effusion is present. The pericardial effusion is posterior to the left ventricle. Mitral Valve: The mitral valve is normal in structure. Mild mitral valve regurgitation. No evidence of mitral valve stenosis. Tricuspid Valve: The tricuspid valve is normal in structure. Tricuspid valve regurgitation is not demonstrated. No evidence of tricuspid stenosis. Aortic  Valve: The aortic valve is calcified. Aortic valve regurgitation is mild. Aortic valve mean gradient measures 17.0 mmHg. Aortic valve peak gradient measures 27.6 mmHg. Aortic valve area, by VTI measures 1.21 cm. Pulmonic Valve: The pulmonic valve was normal in structure. Pulmonic valve regurgitation is not visualized. No evidence of pulmonic stenosis. Aorta: The aortic root is normal in size and structure. Venous: The inferior vena cava is normal in size with greater than 50% respiratory variability, suggesting right atrial pressure of 3 mmHg. IAS/Shunts: The interatrial septum was not assessed.  LEFT VENTRICLE PLAX 2D LVIDd:         4.80 cm   Diastology LVIDs:         3.30 cm   LV e' medial:    6.85 cm/s LV PW:         1.50 cm   LV E/e' medial:  18.1 LV IVS:        1.50 cm   LV e'  lateral:   9.90 cm/s LVOT diam:     2.10 cm   LV E/e' lateral: 12.5 LV SV:         74 LV SV Index:   35 LVOT Area:     3.46 cm  RIGHT VENTRICLE             IVC RV Basal diam:  3.60 cm     IVC diam: 1.90 cm RV Mid diam:    2.80 cm RV S prime:     14.40 cm/s TAPSE (M-mode): 2.5 cm LEFT ATRIUM              Index        RIGHT ATRIUM           Index LA diam:        4.40 cm  2.05 cm/m   RA Area:     27.60 cm LA Vol (A2C):   120.0 ml 56.02 ml/m  RA Volume:   91.40 ml  42.67 ml/m LA Vol (A4C):   110.0 ml 51.36 ml/m LA Biplane Vol: 115.0 ml 53.69 ml/m  AORTIC VALVE AV Area (Vmax):    1.20 cm AV Area (Vmean):   1.13 cm AV Area (VTI):     1.21 cm AV Vmax:           262.50 cm/s AV Vmean:          195.000 cm/s AV VTI:            0.615 m AV Peak Grad:      27.6 mmHg AV Mean Grad:      17.0 mmHg LVOT Vmax:         91.10 cm/s LVOT Vmean:        63.900 cm/s LVOT VTI:          0.215 m LVOT/AV VTI ratio: 0.35  AORTA Ao Root diam: 3.20 cm Ao Asc diam:  2.90 cm MITRAL VALVE MV Area (PHT): 3.15 cm     SHUNTS MV Decel Time: 241 msec     Systemic VTI:  0.22 m MV E velocity: 124.00 cm/s  Systemic Diam: 2.10 cm MV A velocity: 77.10 cm/s MV E/A ratio:  1.61 Manish Patwardhan MD Electronically signed by Vernell Leep MD Signature Date/Time: 03/15/2022/3:38:31 PM    Final      LOS: 3 days   Antonieta Pert, MD [facility]  03/16/2022, 11:17 AM

## 2022-03-17 ENCOUNTER — Inpatient Hospital Stay (HOSPITAL_COMMUNITY): Payer: Medicare HMO

## 2022-03-17 DIAGNOSIS — I739 Peripheral vascular disease, unspecified: Secondary | ICD-10-CM | POA: Diagnosis not present

## 2022-03-17 DIAGNOSIS — L0390 Cellulitis, unspecified: Secondary | ICD-10-CM | POA: Diagnosis not present

## 2022-03-17 DIAGNOSIS — M86171 Other acute osteomyelitis, right ankle and foot: Secondary | ICD-10-CM | POA: Diagnosis not present

## 2022-03-17 DIAGNOSIS — R072 Precordial pain: Secondary | ICD-10-CM

## 2022-03-17 LAB — CBC
HCT: 28.2 % — ABNORMAL LOW (ref 39.0–52.0)
Hemoglobin: 9.5 g/dL — ABNORMAL LOW (ref 13.0–17.0)
MCH: 30.6 pg (ref 26.0–34.0)
MCHC: 33.7 g/dL (ref 30.0–36.0)
MCV: 91 fL (ref 80.0–100.0)
Platelets: 170 10*3/uL (ref 150–400)
RBC: 3.1 MIL/uL — ABNORMAL LOW (ref 4.22–5.81)
RDW: 15 % (ref 11.5–15.5)
WBC: 7 10*3/uL (ref 4.0–10.5)
nRBC: 0 % (ref 0.0–0.2)

## 2022-03-17 LAB — GLUCOSE, CAPILLARY
Glucose-Capillary: 123 mg/dL — ABNORMAL HIGH (ref 70–99)
Glucose-Capillary: 102 mg/dL — ABNORMAL HIGH (ref 70–99)
Glucose-Capillary: 100 mg/dL — ABNORMAL HIGH (ref 70–99)
Glucose-Capillary: 123 mg/dL — ABNORMAL HIGH (ref 70–99)

## 2022-03-17 LAB — BASIC METABOLIC PANEL
Anion gap: 13 (ref 5–15)
BUN: 43 mg/dL — ABNORMAL HIGH (ref 8–23)
CO2: 25 mmol/L (ref 22–32)
Calcium: 8.2 mg/dL — ABNORMAL LOW (ref 8.9–10.3)
Chloride: 98 mmol/L (ref 98–111)
Creatinine, Ser: 8.51 mg/dL — ABNORMAL HIGH (ref 0.61–1.24)
GFR, Estimated: 6 mL/min — ABNORMAL LOW (ref >60)
Glucose, Bld: 108 mg/dL — ABNORMAL HIGH (ref 70–99)
Potassium: 4.1 mmol/L (ref 3.5–5.1)
Sodium: 136 mmol/L (ref 135–145)

## 2022-03-17 LAB — HEPARIN LEVEL (UNFRACTIONATED): Heparin Unfractionated: 0.31 IU/mL (ref 0.30–0.70)

## 2022-03-17 MED ORDER — HYDROMORPHONE HCL 1 MG/ML IJ SOLN
0.5000 mg | Freq: Once | INTRAMUSCULAR | Status: DC
  Filled 2022-03-17: qty 0.5

## 2022-03-17 MED ORDER — OXYCODONE HCL 5 MG PO TABS
5.0000 mg | ORAL_TABLET | Freq: Four times a day (QID) | ORAL | Status: AC | PRN
  Administered 2022-03-18 (×3): 5 mg via ORAL
  Filled 2022-03-17 (×2): qty 1

## 2022-03-17 MED ORDER — HYDRALAZINE HCL 50 MG PO TABS
100.0000 mg | ORAL_TABLET | Freq: Three times a day (TID) | ORAL | Status: DC
  Administered 2022-03-17 – 2022-03-20 (×8): 100 mg via ORAL
  Filled 2022-03-17 (×8): qty 2

## 2022-03-17 MED ORDER — POLYETHYLENE GLYCOL 3350 17 G PO PACK: 17.0000 g | PACK | Freq: Every day | ORAL | Status: DC | PRN

## 2022-03-17 NOTE — Progress Notes (Signed)
PROGRESS NOTE Unnamed [facility]  YBO:[id number] DOB: 1950/12/15 DOA: 03/12/2022 PCP: Collene Leyden, MD   Brief Narrative/Hospital Course:  71 y.o. male with history of ESRD on hemodialysis, CAD, peripheral artery disease, diabetes mellitus, atrial fibrillation was brought to the ER after patient was found missing by patient's family.  Patient states he was driving out of the dialysis and was trying to get some food.  He does not remember being confused. Patient's family found patient in the driving lot sitting inside the car and was brought to the ER. During last dialysis session prior to 5/15 he briefly had a CODE and was briefly resuscitated at the time patient refused to come to the ER.  High sensitive troponin was 107, 117.  EKG shows normal sinus rhythm.  Had blood cultures drawn.  X-rays of the right foot was done because there was some discharge coming from the right second toe which shows features concerning for osteomyelitis.  Antibiotics have been started.  Patient was admitted for further work-up.  Cardiology podiatry and nephrology consulted. Had successful right TP trunk and medial RT common peroneal artery revascularization by Dr. Virgina Jock 5/17  Underwent right second digit partial amputation 5/19.   Subjective: Seen and examined this morning in dialysis.  Resting comfortably. Overnight complained of chest pain Had EKG no evidence of acute ischemia. Reports his left-sided chest pain is easing up  Assessment and Plan: Principal Problem:   Acute osteomyelitis of toe, right (HCC) Active Problems:   Diabetes mellitus with renal complications (HCC)   Persistent atrial fibrillation (HCC)   ESRD (end stage renal disease) on dialysis (HCC)   SIRS (systemic inflammatory response syndrome) (HCC)   Osteomyelitis (HCC)   Critical limb ischemia of right lower extremity (HCC)   Acute osteomyelitis of Rt 2nd toe PVD: S/P right TP trunk and medial RT common peroneal artery  revascularization by Dr. Virgina Jock 5/17.  Underwent right second digit partial amputation 5/19.  Continue Plavix.For long-term we will continue Eliquis + Plavix without aspirin.  On vancomycin/cefepime-await further recommendation from podiatry  Elevated troponin History of CAD Recent brief ?cardiac arrest in HD 5/12 Left sided Chest pain 5/20 a.m.:  Cardiology following closely-Echo with intact LV function.  Patient is on statin Plavix,Imdur,Coreg along with heparin drip.  Overnight had chest pain EKG nonischemic-notified Dr. Virgina Jock who is going to assess him this morning   Acute metabolic encephalopathy likely from osteomyelitis/infection in the setting of multiple comorbidities.  Mental status improved and stable.   ESRD on HD MWF: Nephrology on board for dialysis-being planned for today. MBD CKD: Calcium post stable at goal continue binders Anemia of ESRD: Hb overall stable,Monitor. ESA not due until 5/24 Recent Labs  Lab 03/14/22 0458 03/15/22 0130 03/16/22 0052 03/16/22 1637 03/17/22 0739  HGB 9.7* 10.3* 9.8* 11.6* 9.5*  HCT 29.8* 31.3* 30.2* 34.0* 28.2*    Diabetes mellitus with renal complications, blood sugar well controlled.  Keep on SSI.   Recent Labs  Lab 03/16/22 1453 03/16/22 1527 03/16/22 1725 03/16/22 2123 03/17/22 0604  GLUCAP 102* 95 109* 143* 123*    Persistent atrial fibrillation: Rate controlled continue Coreg, Eliquis converted to heparin peri-op  Hypertension urgency Hypertension: BP well controlled.  Cont Imdur hydralazine Lasix  and Coreg, will continue to monitor and adjust, cardiology managing appreciate input  DVT prophylaxis: SCD's Start: 03/14/22 1940 heparin drip Code Status:   Code Status: Full Code Family Communication: plan of care discussed with patient at bedside.  Daughter was on the phone listening  to the conversation. Patient status is: Inpatient because of ongoing management of osteomyelitis Level of care: Progressive Cardiac    Dispo: The patient is from: home            Anticipated disposition: Home 1-2 days  Mobility Assessment (last 72 hours)     Mobility Assessment     Row Name 03/17/22 0800 03/17/22 0700 03/16/22 1955 03/16/22 1843 03/16/22 0800   Does patient have an order for bedrest or is patient medically unstable No - Continue assessment No - Continue assessment No - Continue assessment No - Continue assessment No - Continue assessment   What is the highest level of mobility based on the progressive mobility assessment? -- Level 2 (Chairfast) - Balance while sitting on edge of bed and cannot stand Level 2 (Chairfast) - Balance while sitting on edge of bed and cannot stand Level 2 (Chairfast) - Balance while sitting on edge of bed and cannot stand Level 5 (Walks with assist in room/hall) - Balance while stepping forward/back and can walk in room with assist - Complete   Is the above level different from baseline mobility prior to current illness? -- No - Consider discontinuing PT/OT No - Consider discontinuing PT/OT -- --    Row Name 03/16/22 0737 03/15/22 1700 03/15/22 1200 03/15/22 0738     Does patient have an order for bedrest or is patient medically unstable No - Continue assessment No - Continue assessment No - Continue assessment No - Continue assessment    What is the highest level of mobility based on the progressive mobility assessment? Level 5 (Walks with assist in room/hall) - Balance while stepping forward/back and can walk in room with assist - Complete Level 5 (Walks with assist in room/hall) - Balance while stepping forward/back and can walk in room with assist - Complete Level 5 (Walks with assist in room/hall) - Balance while stepping forward/back and can walk in room with assist - Complete Level 5 (Walks with assist in room/hall) - Balance while stepping forward/back and can walk in room with assist - Complete              Objective: Vitals last 24 hrs: Vitals:   03/17/22 0830  03/17/22 0900 03/17/22 0930 03/17/22 1000  BP: (!) 172/69 (!) 165/75 (!) 187/68 (!) 188/72  Pulse: 73 73 72 71  Resp:  '16 14 18  '$ Temp:      TempSrc:      SpO2:      Weight:      Height:       Weight change:   Physical Examination: General exam: AA OX3,older than stated age, weak appearing. HEENT:Oral mucosa moist, Ear/Nose WNL grossly, dentition normal. Respiratory system: bilaterally diminished,no use of accessory muscle Cardiovascular system: S1 & S2 +, No JVD,. Gastrointestinal system: Abdomen soft,NT,ND, BS+ Nervous System:Alert, awake, moving extremities and grossly nonfocal Extremities: Right foot with dressing in place Skin: No rashes,no icterus. MSK: Normal muscle bulk,tone, power  Medications reviewed:  Scheduled Meds:  carvedilol  12.5 mg Oral BID   Chlorhexidine Gluconate Cloth  6 each Topical Q0600   clopidogrel  75 mg Oral Daily   doxercalciferol  3 mcg Intravenous Q M,W,F-HD   furosemide  80 mg Oral BID   gabapentin  300 mg Oral BID   hydrALAZINE  50 mg Oral BID    HYDROmorphone (DILAUDID) injection  0.5 mg Intravenous Once   insulin aspart  0-6 Units Subcutaneous TID WC   isosorbide mononitrate  60 mg  Oral QHS   pneumococcal 20-valent conjugate vaccine  0.5 mL Intramuscular Tomorrow-1000   sevelamer carbonate  800 mg Oral TID with meals   sodium chloride flush  3 mL Intravenous Q12H   sodium chloride flush  3 mL Intravenous Q12H   sucroferric oxyhydroxide  500 mg Oral TID   tamsulosin  0.4 mg Oral QHS   vitamin B-12  5,000 mcg Oral Daily   Continuous Infusions:  sodium chloride 250 mL (03/16/22 1857)   sodium chloride     ceFEPime (MAXIPIME) IV 1 g (03/16/22 2231)   heparin 1,300 Units/hr (03/17/22 0334)   vancomycin 1,000 mg (03/17/22 1011)    Diet Order             Diet Carb Modified Fluid consistency: Thin; Room service appropriate? Yes  Diet effective now                  Intake/Output Summary (Last 24 hours) at 03/17/2022 1032 Last data  filed at 03/16/2022 1900 Gross per 24 hour  Intake 610 ml  Output 5 ml  Net 605 ml   Net IO Since Admission: 1,684.18 mL [03/17/22 1032]  Wt Readings from Last 3 Encounters:  03/17/22 85.4 kg  12/05/21 84.3 kg  10/19/21 86.6 kg   Unresulted Labs (From admission, onward)     Start     Ordered   03/16/22 0500  Heparin level (unfractionated)  Daily,   R     Question:  Specimen collection method  Answer:  Lab=Lab collect   03/14/22 2242   03/14/22 0500  CBC  Daily,   R      03/13/22 0322          Data Reviewed: I have personally reviewed following labs and imaging studies CBC: Recent Labs  Lab 03/12/22 2202 03/12/22 2216 03/13/22 0336 03/14/22 0458 03/15/22 0130 03/16/22 0052 03/16/22 1637 03/17/22 0739  WBC 10.5  --  8.6 6.3 5.8 5.4  --  7.0  NEUTROABS 9.1*  --   --   --   --   --   --   --   HGB 12.0*   < > 11.3* 9.7* 10.3* 9.8* 11.6* 9.5*  HCT 36.7*   < > 34.5* 29.8* 31.3* 30.2* 34.0* 28.2*  MCV 93.9  --  93.2 92.3 92.3 92.4  --  91.0  PLT 190  --  168 175 170 164  --  170   < > = values in this interval not displayed.   Basic Metabolic Panel: Recent Labs  Lab 03/13/22 0336 03/13/22 2211 03/14/22 0458 03/15/22 0130 03/16/22 0052 03/16/22 1637 03/17/22 0739  NA 136  --  134* 136 135 135 136  K 3.4*  --  3.8 3.5 3.5 4.0 4.1  CL 94*  --  94* 98 96* 97* 98  CO2 31  --  '28 30 26  '$ --  25  GLUCOSE 182*  --  115* 128* 188* 93 108*  BUN 28*  --  38* 20 32* 36* 43*  CREATININE 6.43*  --  8.11* 5.40* 7.04* 8.30* 8.51*  CALCIUM 7.6*  --  8.0* 8.1* 8.3*  --  8.2*  PHOS  --  4.0  --   --   --   --   --    GFR: Estimated Creatinine Clearance: 9.4 mL/min (A) (by C-G formula based on SCr of 8.51 mg/dL (H)). Liver Function Tests: Recent Labs  Lab 03/12/22 2202 03/13/22 0336  AST 15 18  ALT  11 10  ALKPHOS 98 83  BILITOT 1.3* 0.8  PROT 8.2* 7.3  ALBUMIN 3.5 3.1*   No results for input(s): LIPASE, AMYLASE in the last 168 hours. No results for input(s): AMMONIA  in the last 168 hours. Coagulation Profile: Recent Labs  Lab 03/12/22 2202  INR 1.3*   BNP (last 3 results) No results for input(s): PROBNP in the last 8760 hours. HbA1C: No results for input(s): HGBA1C in the last 72 hours. CBG: Recent Labs  Lab 03/16/22 1453 03/16/22 1527 03/16/22 1725 03/16/22 2123 03/17/22 0604  GLUCAP 102* 95 109* 143* 123*   Lipid Profile: Recent Labs    03/15/22 0130  CHOL 139  HDL 27*  LDLCALC 87  TRIG 127  CHOLHDL 5.1   Thyroid Function Tests: No results for input(s): TSH, T4TOTAL, FREET4, T3FREE, THYROIDAB in the last 72 hours. Sepsis Labs: Recent Labs  Lab 03/12/22 2202  LATICACIDVEN 1.1    Recent Results (from the past 240 hour(s))  Blood Culture (routine x 2)     Status: None (Preliminary result)   Collection Time: 03/12/22 10:02 PM   Specimen: BLOOD  Result Value Ref Range Status   Specimen Description   Final    BLOOD BLOOD RIGHT FOREARM Performed at [facility] [address], [place], [place] [address]    Special Requests   Final    BOTTLES DRAWN AEROBIC AND ANAEROBIC Blood Culture adequate volume Performed at [facility] [address] [place], [place] [address]    Culture   Final    NO GROWTH 4 DAYS Performed at [facility], [place] [address], [place], [place] [address]    Report Status PENDING  Incomplete  Resp Panel by RT-PCR (Flu A&B, Covid) Nasopharyngeal Swab     Status: None   Collection Time: 03/12/22 10:53 PM   Specimen: Nasopharyngeal Swab; Nasopharyngeal(NP) swabs in vial transport medium  Result Value Ref Range Status   SARS Coronavirus 2 by RT PCR NEGATIVE NEGATIVE Final    Comment: (NOTE) SARS-CoV-2 target nucleic acids are NOT DETECTED.  The SARS-CoV-2 RNA is generally detectable in upper respiratory specimens during the acute phase of infection. The lowest concentration of SARS-CoV-2 viral copies this assay can detect is 138 copies/mL. A negative  result does not preclude SARS-Cov-2 infection and should not be used as the sole basis for treatment or other patient management decisions. A negative result may occur with  improper specimen collection/handling, submission of specimen other than nasopharyngeal swab, presence of viral mutation(s) within the areas targeted by this assay, and inadequate number of viral copies(<138 copies/mL). A negative result must be combined with clinical observations, patient history, and epidemiological information. The expected result is Negative.  Fact Sheet for Patients:  EntrepreneurPulse.com.au  Fact Sheet for Healthcare Providers:  IncredibleEmployment.be  This test is no t yet approved or cleared by the [place] FDA and  has been authorized for detection and/or diagnosis of SARS-CoV-2 by FDA under an Emergency Use Authorization (EUA). This EUA will remain  in effect (meaning this test can be used) for the duration of the COVID-19 declaration under Section 564(b)(1) of the Act, 21 U.S.C.section 360bbb-3(b)(1), unless the authorization is terminated  or revoked sooner.       Influenza A by PCR NEGATIVE NEGATIVE Final   Influenza B by PCR NEGATIVE NEGATIVE Final    Comment: (NOTE) The Xpert Xpress SARS-CoV-2/FLU/RSV plus assay is intended as an aid in the diagnosis of influenza from Nasopharyngeal swab  specimens and should not be used as a sole basis for treatment. Nasal washings and aspirates are unacceptable for Xpert Xpress SARS-CoV-2/FLU/RSV testing.  Fact Sheet for Patients: EntrepreneurPulse.com.au  Fact Sheet for Healthcare Providers: IncredibleEmployment.be  This test is not yet approved or cleared by the [place] FDA and has been authorized for detection and/or diagnosis of SARS-CoV-2 by FDA under an Emergency Use Authorization (EUA). This EUA will remain in effect (meaning this test can be used)  for the duration of the COVID-19 declaration under Section 564(b)(1) of the Act, 21 U.S.C. section 360bbb-3(b)(1), unless the authorization is terminated or revoked.  Performed at [facility], [place] [address] [place], [place] [address]   Blood Culture (routine x 2)     Status: None (Preliminary result)   Collection Time: 03/12/22 11:30 PM   Specimen: BLOOD  Result Value Ref Range Status   Specimen Description   Final    BLOOD BLOOD RIGHT ARM Performed at [facility] [address], [place], [place] [address]    Special Requests   Final    BOTTLES DRAWN AEROBIC AND ANAEROBIC Blood Culture adequate volume Performed at [facility] [address] [place], [place] [address]    Culture   Final    NO GROWTH 4 DAYS Performed at [facility], [place] [address], [place], [place] [address]    Report Status PENDING  Incomplete  MRSA Next Gen by PCR, Nasal     Status: None   Collection Time: 03/14/22  7:30 PM   Specimen: Nasal Mucosa; Nasal Swab  Result Value Ref Range Status   MRSA by PCR Next Gen NOT DETECTED NOT DETECTED Final    Comment: (NOTE) The GeneXpert MRSA Assay (FDA approved for NASAL specimens only), is one component of a comprehensive MRSA colonization surveillance program. It is not intended to diagnose MRSA infection nor to guide or monitor treatment for MRSA infections. Test performance is not FDA approved in patients less than 82 years old. Performed at [facility], [place] [address] [place], [place] [address]   Aerobic/Anaerobic Culture w Gram Stain (surgical/deep wound)     Status: None (Preliminary result)   Collection Time: 03/16/22  5:14 PM   Specimen: Soft Tissue, Other  Result Value Ref Range Status   Specimen Description WOUND  Final   Special Requests RIGHT FOOT WOUND  Final   Gram Stain   Final    FEW WBC PRESENT, PREDOMINANTLY MONONUCLEAR NO ORGANISMS SEEN    Culture   Final    TOO  YOUNG TO READ Performed at [facility], [place] [address], [place], [place] [address]    Report Status PENDING  Incomplete    Antimicrobials: Anti-infectives (From admission, onward)    Start     Dose/Rate Route Frequency Ordered Stop   03/14/22 1200  vancomycin (VANCOCIN) IVPB 1000 mg/200 mL premix        1,000 mg 200 mL/hr over 60 Minutes Intravenous Every M-W-F (Hemodialysis) 03/13/22 0941     03/13/22 2300  ceFEPIme (MAXIPIME) 1 g in sodium chloride 0.9 % 100 mL IVPB        1 g 200 mL/hr over 30 Minutes Intravenous Every 24 hours 03/13/22 0938     03/13/22 0342  vancomycin variable dose per unstable renal function (pharmacist dosing)  Status:  Discontinued         Does not apply See admin instructions 03/13/22 0343 03/13/22 0941   03/13/22  0000  ceFEPIme (MAXIPIME) 2 g in sodium chloride 0.9 % 100 mL IVPB  Status:  Discontinued        2 g 200 mL/hr over 30 Minutes Intravenous Every 24 hours 03/12/22 2256 03/13/22 0938   03/12/22 2300  vancomycin (VANCOCIN) IVPB 1000 mg/200 mL premix        1,000 mg 200 mL/hr over 60 Minutes Intravenous  Once 03/12/22 2253 03/13/22 0132      Culture/Microbiology    Component Value Date/Time   SDES WOUND 03/16/2022 1714   SPECREQUEST RIGHT FOOT WOUND 03/16/2022 1714   CULT  03/16/2022 1714    TOO YOUNG TO READ Performed at [facility] [address], [place], [place] [address]    REPTSTATUS PENDING 03/16/2022 1714    Other culture-see note  Radiology Studies: DG Foot Complete Right  Result Date: 03/16/2022 CLINICAL DATA:  Status post second toe amputation, initial encounter EXAM: RIGHT FOOT COMPLETE - 3+ VIEW COMPARISON:  03/12/2022 FINDINGS: Second toe has been amputated at the metatarsophalangeal joint. Degenerative changes of the first MTP joint are seen. No acute fracture or dislocation is noted. No other focal abnormality is seen. IMPRESSION: Status post second toe amputation. Electronically Signed   By: Inez Catalina M.D.    On: 03/16/2022 19:32   ECHOCARDIOGRAM COMPLETE  Result Date: 03/15/2022    ECHOCARDIOGRAM REPORT   Patient Name:   INEZ ROSATO Date of Exam: 03/15/2022 Medical Rec #:  [id number]              Height:       75.0 in Accession #:    [id number]             Weight:       188.9 lb Date of Birth:  07-25-1951              BSA:          2.142 m Patient Age:    70 years               BP:           171/68 mmHg Patient Gender: M                      HR:           74 bpm. Exam Location:  Inpatient Procedure: 2D Echo, Color Doppler and Cardiac Doppler Indications:     Elevated troponin  History:         Patient has prior history of Echocardiogram examinations, most                  recent 11/08/2020. Arrythmias:Atrial Fibrillation; Risk                  Factors:Diabetes, Hypertension, Dyslipidemia and Former Smoker.                  ESRD.  Sonographer:     Clayton Lefort RDCS (AE) Referring Phys:  [id number] [facility] PATWARDHAN Diagnosing Phys: Vernell Leep MD IMPRESSIONS  1. Left ventricular ejection fraction, by estimation, is 55 to 60%. The left ventricle has normal function. The left ventricle has no regional wall motion abnormalities. There is moderate left ventricular hypertrophy. Left ventricular diastolic parameters are consistent with Grade II diastolic dysfunction (pseudonormalization). Elevated left atrial pressure.  2. Right ventricular systolic function is normal. The right ventricular size is normal.  3. Left atrial size was severely dilated.  4. Right  atrial size was moderately dilated.  5. A small pericardial effusion is present. The pericardial effusion is posterior to the left ventricle.  6. The mitral valve is normal in structure. Mild mitral valve regurgitation. No evidence of mitral stenosis.  7. The aortic valve is calcified. Aortic valve regurgitation is mild. Aortic valve area, by VTI measures 1.21 cm. Aortic valve mean gradient measures 17.0 mmHg. Aortic valve Vmax measures 2.62 m/s.  8. The  inferior vena cava is normal in size with greater than 50% respiratory variability, suggesting right atrial pressure of 3 mmHg. Comparison(s): A prior study was performed on 09/15/2020. No significant change from prior study. FINDINGS  Left Ventricle: Left ventricular ejection fraction, by estimation, is 55 to 60%. The left ventricle has normal function. The left ventricle has no regional wall motion abnormalities. The left ventricular internal cavity size was normal in size. There is  moderate left ventricular hypertrophy. Left ventricular diastolic parameters are consistent with Grade II diastolic dysfunction (pseudonormalization). Elevated left atrial pressure. Right Ventricle: The right ventricular size is normal. No increase in right ventricular wall thickness. Right ventricular systolic function is normal. Left Atrium: Left atrial size was severely dilated. Right Atrium: Right atrial size was moderately dilated. Pericardium: A small pericardial effusion is present. The pericardial effusion is posterior to the left ventricle. Mitral Valve: The mitral valve is normal in structure. Mild mitral valve regurgitation. No evidence of mitral valve stenosis. Tricuspid Valve: The tricuspid valve is normal in structure. Tricuspid valve regurgitation is not demonstrated. No evidence of tricuspid stenosis. Aortic Valve: The aortic valve is calcified. Aortic valve regurgitation is mild. Aortic valve mean gradient measures 17.0 mmHg. Aortic valve peak gradient measures 27.6 mmHg. Aortic valve area, by VTI measures 1.21 cm. Pulmonic Valve: The pulmonic valve was normal in structure. Pulmonic valve regurgitation is not visualized. No evidence of pulmonic stenosis. Aorta: The aortic root is normal in size and structure. Venous: The inferior vena cava is normal in size with greater than 50% respiratory variability, suggesting right atrial pressure of 3 mmHg. IAS/Shunts: The interatrial septum was not assessed.  LEFT VENTRICLE  PLAX 2D LVIDd:         4.80 cm   Diastology LVIDs:         3.30 cm   LV e' medial:    6.85 cm/s LV PW:         1.50 cm   LV E/e' medial:  18.1 LV IVS:        1.50 cm   LV e' lateral:   9.90 cm/s LVOT diam:     2.10 cm   LV E/e' lateral: 12.5 LV SV:         74 LV SV Index:   35 LVOT Area:     3.46 cm  RIGHT VENTRICLE             IVC RV Basal diam:  3.60 cm     IVC diam: 1.90 cm RV Mid diam:    2.80 cm RV S prime:     14.40 cm/s TAPSE (M-mode): 2.5 cm LEFT ATRIUM              Index        RIGHT ATRIUM           Index LA diam:        4.40 cm  2.05 cm/m   RA Area:     27.60 cm LA Vol (A2C):   120.0 ml 56.02 ml/m  RA Volume:  91.40 ml  42.67 ml/m LA Vol (A4C):   110.0 ml 51.36 ml/m LA Biplane Vol: 115.0 ml 53.69 ml/m  AORTIC VALVE AV Area (Vmax):    1.20 cm AV Area (Vmean):   1.13 cm AV Area (VTI):     1.21 cm AV Vmax:           262.50 cm/s AV Vmean:          195.000 cm/s AV VTI:            0.615 m AV Peak Grad:      27.6 mmHg AV Mean Grad:      17.0 mmHg LVOT Vmax:         91.10 cm/s LVOT Vmean:        63.900 cm/s LVOT VTI:          0.215 m LVOT/AV VTI ratio: 0.35  AORTA Ao Root diam: 3.20 cm Ao Asc diam:  2.90 cm MITRAL VALVE MV Area (PHT): 3.15 cm     SHUNTS MV Decel Time: 241 msec     Systemic VTI:  0.22 m MV E velocity: 124.00 cm/s  Systemic Diam: 2.10 cm MV A velocity: 77.10 cm/s MV E/A ratio:  1.61 Manish Patwardhan MD Electronically signed by Vernell Leep MD Signature Date/Time: 03/15/2022/3:38:31 PM    Final      LOS: 4 days   Antonieta Pert, MD [facility]  03/17/2022, 10:32 AM

## 2022-03-17 NOTE — Progress Notes (Signed)
VASCULAR LAB    Bilateral lower extremity arterial duplex has been performed.  See CV proc for preliminary results.   Lillyann Ahart, RVT 03/17/2022, 4:23 PM

## 2022-03-17 NOTE — Progress Notes (Signed)
   03/17/22 1134  Vitals  BP (!) 180/87  BP Location Right Arm  BP Method Automatic  Patient Position (if appropriate) Lying  Pulse Rate 76  Pulse Rate Source Monitor  Resp 17  During Hemodialysis Assessment  Intra-Hemodialysis Comments Tx completed  Post-Hemodialysis Assessment  Rinseback Volume (mL) 250 mL  KECN 254 V  Dialyzer Clearance Lightly streaked  Duration of HD Treatment -hour(s) 3.5 hour(s)  Hemodialysis Intake (mL) 700 mL  UF Total -Machine (mL) 2700 mL  Net UF (mL) 2000 mL  Tolerated HD Treatment Yes  Post-Hemodialysis Comments tx completed, uf goal met, hypertensive pre and throughout tx. Asymptomatic, denies pain, no distress noted. VSS.  AVG/AVF Arterial Site Held (minutes) 7 minutes  AVG/AVF Venous Site Held (minutes) 5 minutes   UF goal met, no adverse events throughout tx.

## 2022-03-17 NOTE — Progress Notes (Signed)
Pharmacy Antibiotic Note  William Webb is a 71 y.o. male admitted on 03/12/2022 with  hx of ESRD on HD (last HD was today), here presenting with confusion and fever - found to have osteo. 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 Pharmacy has been consulted to dose cefepime and vancomycin - today is Day #5 of abx.  Noted pt off HD schedule with surgery on 5/19 - vanc was given appropriately.   Plan: Cefepime 1 g IV q24h Continue vancomycin 1 g IV MWF with dialysis Pharmacy to continue to follow cultures, clinical progress and dialysis schedule for antibiotic dosage adjustments or de-escalation as indicated Per podiatry, likely home on ~1 week of oral abx depending on cx results   Height: '6\' 3"'$  (190.5 cm) Weight: 83.2 kg (183 lb 6.8 oz) IBW/kg (Calculated) : 84.5  Temp (24hrs), Avg:99.1 F (37.3 C), Min:97.9 F (36.6 C), Max:99.9 F (37.7 C)  Recent Labs  Lab 03/12/22 2202 03/12/22 2216 03/13/22 0336 03/14/22 0458 03/15/22 0130 03/16/22 0052 03/16/22 1637 03/17/22 0739  WBC 10.5  --  8.6 6.3 5.8 5.4  --  7.0  CREATININE 5.65*   < > 6.43* 8.11* 5.40* 7.04* 8.30* 8.51*  LATICACIDVEN 1.1  --   --   --   --   --   --   --    < > = values in this interval not displayed.     Estimated Creatinine Clearance: 9.2 mL/min (A) (by C-G formula based on SCr of 8.51 mg/dL (H)).    No Known Allergies  Antimicrobials this admission: 5/15 cefepime >> 5/16 vancomycin >>  Microbiology results: 5/15 BCx - ngtd 5/17 MRSA PCR - neg 5/19 R foot wound cx -   Thank you for allowing pharmacy to be a part of this patient's care.  Sherlon Handing, PharmD, BCPS Please see amion for complete clinical pharmacist phone list 03/17/2022 3:24 PM

## 2022-03-17 NOTE — Progress Notes (Signed)
Reported crushing chest pain by bedside RN with movement.  Patient was seen by cardiology 2 days ago, for prior chest pain and elevated troponin and felt at that time that his chest pain was musculoskeletal.  Patient is currently on heparin drip for hx of persistent A-fib.  Home Eliquis was converted to heparin drip.  The patient is also on plavix for hx of PAD.  12 lead EKG reviewed and without evidence of acute ischemia.  Serial EKGs in place, q3H x3 occurrences.  2D echo done on 03/15/22 with intact LV function.  IV analgesic ordered.  We will continue to treat and closely monitor as indicated.

## 2022-03-17 NOTE — Progress Notes (Signed)
[place] for heparin Indication: atrial fibrillation  No Known Allergies  Patient Measurements: Height: '6\' 3"'$  (190.5 cm) Weight: 83.2 kg (183 lb 6.8 oz) IBW/kg (Calculated) : 84.5 Heparin Dosing Weight: 83.9kg  Vital Signs: Temp: 99.2 F (37.3 C) (05/20 1230) Temp Source: Oral (05/20 1230) BP: 195/75 (05/20 1230) Pulse Rate: 81 (05/20 1230)  Labs: Recent Labs    03/15/22 0130 03/15/22 1810 03/16/22 0052 03/16/22 1637 03/17/22 0739 03/17/22 0826  HGB 10.3*  --  9.8* 11.6* 9.5*  --   HCT 31.3*  --  30.2* 34.0* 28.2*  --   PLT 170  --  164  --  170  --   APTT  --  66*  --   --   --   --   HEPARINUNFRC  --  0.49 0.37  --   --  0.31  CREATININE 5.40*  --  7.04* 8.30* 8.51*  --      Estimated Creatinine Clearance: 9.2 mL/min (A) (by C-G formula based on SCr of 8.51 mg/dL (H)).   Assessment:  71 y.o. male here presenting with confusion and fever.  Pharmacy consulted to dose heparin for AFib, pt takes eliquis at home.  LD 5/15 @ 0930  Heparin level remains at very low end of therapeutic (0.31) on 1250 units/hr.   Goal of Therapy:  HL 0.3-0.7 units/hr Monitor platelets by anticoagulation protocol: Yes   Plan:  Increase heparin gtt slightly to 1350 units/hr to keep in therapeutic range Daily heparin level, CBC, s/s bleeding  Sherlon Handing, PharmD, BCPS Please see amion for complete clinical pharmacist phone list 03/17/2022 3:05 PM

## 2022-03-17 NOTE — Plan of Care (Signed)
  Problem: Clinical Measurements: Goal: Will remain free from infection Outcome: Progressing Goal: Respiratory complications will improve Outcome: Progressing Goal: Cardiovascular complication will be avoided Outcome: Progressing   

## 2022-03-17 NOTE — Progress Notes (Signed)
Subjective:  Chest discomfort, unchanged since CPR last week at outpatient dialysis center  Objective:  Vital Signs in the last 24 hours: Temp:  [97.9 F (36.6 C)-99.6 F (37.6 C)] 99.6 F (37.6 C) (05/20 0744) Pulse Rate:  [61-77] 71 (05/20 1000) Resp:  [12-22] 18 (05/20 1000) BP: (134-191)/(53-80) 188/72 (05/20 1000) SpO2:  [92 %-100 %] 93 % (05/20 0744) Weight:  [85.4 kg] 85.4 kg (05/20 0744)  Intake/Output from previous day: 05/19 0701 - 05/20 0700 In: 610 [P.O.:360; I.V.:150] Out: 5 [Blood:5]  Physical Exam Vitals and nursing note reviewed.  Constitutional:      General: He is not in acute distress. Neck:     Vascular: No JVD.  Cardiovascular:     Rate and Rhythm: Normal rate and regular rhythm.     Pulses:          Femoral pulses are 3+ on the right side and 3+ on the left side.      Popliteal pulses are 2+ on the right side and 2+ on the left side.       Dorsalis pedis pulses are 0 on the right side and 0 on the left side.       Posterior tibial pulses are 1+ on the right side and 0 on the left side.     Heart sounds: Normal heart sounds. No murmur heard.    Comments: Dressing intact on right foot second toe Pulmonary:     Effort: Pulmonary effort is normal.     Breath sounds: Normal breath sounds. No wheezing or rales.  Musculoskeletal:     Right lower leg: No edema.     Left lower leg: No edema.    Cardiac Studies:  EKG 03/17/2022: Sinus rhythm 78 bpm First-degree AV block LVH No significant ST or ST abnormality  Echocardiogram 03/15/2022:  1. Left ventricular ejection fraction, by estimation, is 55 to 60%. The  left ventricle has normal function. The left ventricle has no regional  wall motion abnormalities. There is moderate left ventricular hypertrophy.  Left ventricular diastolic  parameters are consistent with Grade II diastolic dysfunction  (pseudonormalization). Elevated left atrial pressure.   2. Right ventricular systolic function is normal.  The right ventricular  size is normal.   3. Left atrial size was severely dilated.   4. Right atrial size was moderately dilated.   5. A small pericardial effusion is present. The pericardial effusion is  posterior to the left ventricle.   6. The mitral valve is normal in structure. Mild mitral valve  regurgitation. No evidence of mitral stenosis.   7. The aortic valve is calcified. Aortic valve regurgitation is mild.  Aortic valve area, by VTI measures 1.21 cm. Aortic valve mean gradient  measures 17.0 mmHg. Aortic valve Vmax measures 2.62 m/s.   8. The inferior vena cava is normal in size with greater than 50%  respiratory variability, suggesting right atrial pressure of 3 mmHg.   Comparison(s): A prior study was performed on 09/15/2020. No significant  change from prior study.   PV intervention 03/14/2022: Rt TP trunk 80% stenosis Rt mid common peroneal artery 95% stenosis Prox occlusion of Rt ATA, Rt PT   Successful orbital atherectomy and PTA 3.0X60 mm balloon to Rt TP trunk and Rt mid common peroneal artery with 0% residual stenosis   Expect improvement in foot circulation and healing post amputation at the level of toe or transmetatarsal amputation. If difficulty healing noted, could then consider revascularization to Rt ATA with retrograde  access to Rt DP.                             Assessment & Recommendations:  71 y.o. African American male with hypertension, hyperlipidemia, type 2 DM, CAD, PAD, ESRD now on HD, h/o cardioversion for persistent Afib, small pericardial effusion, pulmonary hypertension, now admitted with fever and confusion, found to have right foot toe osteomyelitis, troponin elevation.  Chest pain: Likely musculoskeletal 2/2 CPR 1 week ago. EKG with no ischemic changes. Do not suspect ACS.     CLI, Rt foot osteomyelitis: S/p successful revascularization to Rt TP trunk, Rt common peroneal. This vessel partially collateralizes Rt DP and PT. I  reckon this will provide adequate circulation for healing post amputation or Rt second toe. Should he have difficulty healing, we will then consider revascularization or proximally occluded Rt AT using retrograde access through Rt DP. Recommend plavix (along with eliquis for PAF) without Aspirin. Reported dark stools likely secondary to iron supplementation. Monitor for any Hb drop.  Now s/p 2nd digit amputation  Hypertension: Uncontrolled. Needs aggressive management.  Consider increasing hydralazine to 100 mg bid.  ?Cardiac arrest: History unclear. Unclear what rhythm he had or whether it was merely hypotension during dialysis in a patient with active osteomyelitis. No arrhythmia on monitor other than occasional PVC. Monitor for now.    Discussed interpretation of tests and management recommendations with the primary team  Outpatient f/u 6/22    Nigel Mormon, MD Pager: [phone] Office: [phone]
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
## 2022-03-17 NOTE — Progress Notes (Signed)
Pt complaining of 8/10 crushing chest pain with movement. Pt denies any SOB or fatigue. Vital signs and EKG was obtained. DR on call was paged and orders for STAT ekg as well at IV dilaudid 0.'5mg'$  was given. Will carry out orders and continue to monitor.   03/17/22 0302  Vitals  Temp 99.2 F (37.3 C)  Temp Source Oral  BP (!) 162/62  MAP (mmHg) 91  BP Location Right Arm  BP Method Automatic  Patient Position (if appropriate) Lying  Pulse Rate 77  Pulse Rate Source Monitor  ECG Heart Rate 77  Resp 14  Level of Consciousness  Level of Consciousness Alert  MEWS COLOR  MEWS Score Color Green  Oxygen Therapy  SpO2 96 %  O2 Device Room Air  MEWS Score  MEWS Temp 0  MEWS Systolic 0  MEWS Pulse 0  MEWS RR 0  MEWS LOC 0  MEWS Score 0

## 2022-03-17 NOTE — Anesthesia Postprocedure Evaluation (Signed)
Anesthesia Post Note  Patient: Rhiley Solem [facility]  Procedure(s) Performed: AMPUTATION TOE, second (Right: Toe)     Patient location during evaluation: PACU Anesthesia Type: MAC Level of consciousness: awake and alert Pain management: pain level controlled Vital Signs Assessment: post-procedure vital signs reviewed and stable Respiratory status: spontaneous breathing, nonlabored ventilation and respiratory function stable Cardiovascular status: stable and blood pressure returned to baseline Anesthetic complications: no   No notable events documented.  Last Vitals:  Vitals:   03/16/22 2342 03/17/22 0302  BP: (!) 134/58 (!) 162/62  Pulse: 63 77  Resp: 12 14  Temp: 36.9 C 37.3 C  SpO2: 95% 96%                   Audry Pili

## 2022-03-17 NOTE — Progress Notes (Signed)
[facility] Progress Note   Subjective:   pt seen in room. Had partial toe amp yesterday. Getting ready for HD this am.   Objective Vitals:   03/17/22 0755 03/17/22 0800 03/17/22 0830 03/17/22 0900  BP: (!) 183/72 (!) 182/71 (!) 172/69 (!) 165/75  Pulse: 69 70 73 73  Resp:    16  Temp:      TempSrc:      SpO2:      Weight:      Height:       Physical Exam General:well appearing male in NAD Heart:RRR, no mrg Lungs:CTAB, nml WOB on RA Abdomen:soft, NTND Extremities:no LE edema Dialysis Access: LU AVF in use   OP HD: SW MWF  4h  425/ 800  85.5kg  2/2 bath  LUA AVF  Heparin 5000 - last HD 5/15, 87.1 > 85.3kg - last Hb 12.2 on 5/10 - doxercalciferol 3 ug IV tiw    Assessment/ Plan: Osteomyelitis - of the R 2nd toe. Getting IV empiric abx and is sp perc revasc of RLE by cardiology on 5/17 and partial 2nd toe amp 5/19 by podiatry.  Elevated troponin - w/ hx CAD, also recent brief arrest on HD last week 5/12. Cardiology consulted > they suspect trop^ is d/t ESRD, pain felt to be MSK from compressions not ACS.  ESRD - on HD MWF. Postponed yesterday's HD to today due to surgery yesterday. HD today in process.  HTN - uncontrolled initially, better now. Cont home coreg/ hydralazine.  Volume - euvolemic on exam, at dry wt. 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 Cont 2 binders ac and IV vdra w/ HD.   Nutrition - Renal diet w/fluid restrictions.   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 On beta blockers    Kelly Splinter, MD 03/17/2022, 9:19 AM  Recent Labs  Lab 03/12/22 2202 03/12/22 2216 03/13/22 0336 03/13/22 2211 03/14/22 0458 03/16/22 0052 03/16/22 1637 03/17/22 0739  HGB 12.0*   < > 11.3*  --    < > 9.8* 11.6* 9.5*  ALBUMIN 3.5  --  3.1*  --   --   --   --   --   CALCIUM 8.0*  --  7.6*  --    < > 8.3*  --  8.2*  PHOS  --   --   --  4.0  --   --   --   --   CREATININE 5.65*   <  > 6.43*  --    < > 7.04* 8.30* 8.51*  K 3.6   < > 3.4*  --    < > 3.5 4.0 4.1   < > = values in this interval not displayed.    Inpatient medications:  carvedilol  12.5 mg Oral BID   Chlorhexidine Gluconate Cloth  6 each Topical Q0600   clopidogrel  75 mg Oral Daily   doxercalciferol  3 mcg Intravenous Q M,W,F-HD   furosemide  80 mg Oral BID   gabapentin  300 mg Oral BID   hydrALAZINE  50 mg Oral BID    HYDROmorphone (DILAUDID) injection  0.5 mg Intravenous Once   insulin aspart  0-6 Units Subcutaneous TID WC   isosorbide mononitrate  60 mg Oral QHS   pneumococcal 20-valent conjugate vaccine  0.5 mL Intramuscular Tomorrow-1000   sevelamer carbonate  800 mg Oral  TID with meals   sodium chloride flush  3 mL Intravenous Q12H   sodium chloride flush  3 mL Intravenous Q12H   sucroferric oxyhydroxide  500 mg Oral TID   tamsulosin  0.4 mg Oral QHS   vitamin B-12  5,000 mcg Oral Daily    sodium chloride 250 mL (03/16/22 1857)   sodium chloride     ceFEPime (MAXIPIME) IV 1 g (03/16/22 2231)   heparin 1,300 Units/hr (03/17/22 0334)   vancomycin Stopped (03/14/22 1213)   sodium chloride, sodium chloride, acetaminophen, alum & mag hydroxide-simeth, heparin, hydrALAZINE, labetalol, ondansetron (ZOFRAN) IV, sodium chloride flush, sodium chloride flush

## 2022-03-18 ENCOUNTER — Encounter (HOSPITAL_COMMUNITY): Payer: Self-pay | Admitting: Podiatry

## 2022-03-18 DIAGNOSIS — M86171 Other acute osteomyelitis, right ankle and foot: Secondary | ICD-10-CM | POA: Diagnosis not present

## 2022-03-18 LAB — GLUCOSE, CAPILLARY
Glucose-Capillary: 109 mg/dL — ABNORMAL HIGH (ref 70–99)
Glucose-Capillary: 107 mg/dL — ABNORMAL HIGH (ref 70–99)
Glucose-Capillary: 114 mg/dL — ABNORMAL HIGH (ref 70–99)
Glucose-Capillary: 114 mg/dL — ABNORMAL HIGH (ref 70–99)

## 2022-03-18 LAB — CBC
HCT: 30 % — ABNORMAL LOW (ref 39.0–52.0)
HCT: 29.4 % — ABNORMAL LOW (ref 39.0–52.0)
Hemoglobin: 9.9 g/dL — ABNORMAL LOW (ref 13.0–17.0)
Hemoglobin: 9.6 g/dL — ABNORMAL LOW (ref 13.0–17.0)
MCH: 30 pg (ref 26.0–34.0)
MCH: 29.7 pg (ref 26.0–34.0)
MCHC: 33 g/dL (ref 30.0–36.0)
MCHC: 32.7 g/dL (ref 30.0–36.0)
MCV: 90.9 fL (ref 80.0–100.0)
MCV: 91 fL (ref 80.0–100.0)
Platelets: 167 10*3/uL (ref 150–400)
Platelets: 164 10*3/uL (ref 150–400)
RBC: 3.3 MIL/uL — ABNORMAL LOW (ref 4.22–5.81)
RBC: 3.23 MIL/uL — ABNORMAL LOW (ref 4.22–5.81)
RDW: 14.8 % (ref 11.5–15.5)
RDW: 14.7 % (ref 11.5–15.5)
WBC: 5.4 10*3/uL (ref 4.0–10.5)
WBC: 5.2 10*3/uL (ref 4.0–10.5)
nRBC: 0 % (ref 0.0–0.2)
nRBC: 0 % (ref 0.0–0.2)

## 2022-03-18 LAB — RENAL FUNCTION PANEL
Albumin: 2.4 g/dL — ABNORMAL LOW (ref 3.5–5.0)
Anion gap: 12 (ref 5–15)
BUN: 27 mg/dL — ABNORMAL HIGH (ref 8–23)
CO2: 26 mmol/L (ref 22–32)
Calcium: 8.3 mg/dL — ABNORMAL LOW (ref 8.9–10.3)
Chloride: 93 mmol/L — ABNORMAL LOW (ref 98–111)
Creatinine, Ser: 6.31 mg/dL — ABNORMAL HIGH (ref 0.61–1.24)
GFR, Estimated: 9 mL/min — ABNORMAL LOW (ref >60)
Glucose, Bld: 115 mg/dL — ABNORMAL HIGH (ref 70–99)
Phosphorus: 4.3 mg/dL (ref 2.5–4.6)
Potassium: 3.5 mmol/L (ref 3.5–5.1)
Sodium: 131 mmol/L — ABNORMAL LOW (ref 135–145)

## 2022-03-18 LAB — CULTURE, BLOOD (ROUTINE X 2)
Culture: NO GROWTH
Culture: NO GROWTH
Special Requests: ADEQUATE
Special Requests: ADEQUATE

## 2022-03-18 LAB — BASIC METABOLIC PANEL
Anion gap: 11 (ref 5–15)
BUN: 21 mg/dL (ref 8–23)
CO2: 27 mmol/L (ref 22–32)
Calcium: 8.3 mg/dL — ABNORMAL LOW (ref 8.9–10.3)
Chloride: 97 mmol/L — ABNORMAL LOW (ref 98–111)
Creatinine, Ser: 5.31 mg/dL — ABNORMAL HIGH (ref 0.61–1.24)
GFR, Estimated: 11 mL/min — ABNORMAL LOW (ref >60)
Glucose, Bld: 109 mg/dL — ABNORMAL HIGH (ref 70–99)
Potassium: 3.7 mmol/L (ref 3.5–5.1)
Sodium: 135 mmol/L (ref 135–145)

## 2022-03-18 LAB — HEPARIN LEVEL (UNFRACTIONATED): Heparin Unfractionated: 0.22 IU/mL — ABNORMAL LOW (ref 0.30–0.70)

## 2022-03-18 MED ORDER — CHLORHEXIDINE GLUCONATE CLOTH 2 % EX PADS
6.0000 | MEDICATED_PAD | Freq: Every day | CUTANEOUS | Status: DC
  Administered 2022-03-18 – 2022-03-20 (×3): 6 via TOPICAL

## 2022-03-18 MED ORDER — APIXABAN 5 MG PO TABS
5.0000 mg | ORAL_TABLET | Freq: Two times a day (BID) | ORAL | Status: DC
  Administered 2022-03-18 – 2022-03-20 (×5): 5 mg via ORAL
  Filled 2022-03-18 (×5): qty 1

## 2022-03-18 MED ORDER — OXYCODONE HCL 5 MG PO TABS
5.0000 mg | ORAL_TABLET | Freq: Once | ORAL | Status: AC
  Administered 2022-03-18: 5 mg via ORAL
  Filled 2022-03-18: qty 1

## 2022-03-18 MED ORDER — ATORVASTATIN CALCIUM 80 MG PO TABS
80.0000 mg | ORAL_TABLET | Freq: Every day | ORAL | Status: DC
  Administered 2022-03-18 – 2022-03-20 (×3): 80 mg via ORAL
  Filled 2022-03-18 (×3): qty 1
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
## 2022-03-18 NOTE — Progress Notes (Signed)
ANTICOAGULATION CONSULT NOTE  Pharmacy Consult for heparin > apixaban Indication: atrial fibrillation  No Known Allergies  Patient Measurements: Height: '6\' 3"'$  (190.5 cm) Weight: 83.2 kg (183 lb 6.8 oz) IBW/kg (Calculated) : 84.5 Heparin Dosing Weight: 83.9kg  Vital Signs: Temp: 98 F (36.7 C) (05/21 0853) Temp Source: Oral (05/21 0853) BP: 150/63 (05/21 0853) Pulse Rate: 63 (05/21 0853)  Labs: Recent Labs    03/15/22 1810 03/15/22 1810 03/16/22 0052 03/16/22 1637 03/17/22 0739 03/17/22 0826 03/18/22 0620  HGB  --    < > 9.8* 11.6* 9.5*  --  9.9*  HCT  --    < > 30.2* 34.0* 28.2*  --  30.0*  PLT  --   --  164  --  170  --  167  APTT 66*  --   --   --   --   --   --   HEPARINUNFRC 0.49  --  0.37  --   --  0.31 0.22*  CREATININE  --    < > 7.04* 8.30* 8.51*  --  5.31*   < > = values in this interval not displayed.     Estimated Creatinine Clearance: 14.8 mL/min (A) (by C-G formula based on SCr of 5.31 mg/dL (H)).   Assessment:  71 y.o. male here presenting with confusion and fever.  Pharmacy consulted to dose heparin for AFib, pt takes eliquis at home.  LD 5/15 @ 0930  Heparin level remains low at 0.22 despite increase in heparin drip rate 1350 uts/hr.  Cbc stable  Change back to PTA apixaban  Age < 80 wt > 60kg cr > 1.5  Goal of Therapy:   Monitor platelets by anticoagulation protocol: Yes   Plan:  Stop heparin drip  Start apixaban '5mg'$  BID Monitor s/s bleeding    Bonnita Nasuti Pharm.D. CPP, BCPS Clinical Pharmacist [phone] 03/18/2022 11:11 AM
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
## 2022-03-19 DIAGNOSIS — M86171 Other acute osteomyelitis, right ankle and foot: Secondary | ICD-10-CM | POA: Diagnosis not present

## 2022-03-19 LAB — RENAL FUNCTION PANEL
Albumin: 2.5 g/dL — ABNORMAL LOW (ref 3.5–5.0)
Anion gap: 14 (ref 5–15)
BUN: 33 mg/dL — ABNORMAL HIGH (ref 8–23)
CO2: 26 mmol/L (ref 22–32)
Calcium: 8.6 mg/dL — ABNORMAL LOW (ref 8.9–10.3)
Chloride: 93 mmol/L — ABNORMAL LOW (ref 98–111)
Creatinine, Ser: 7.22 mg/dL — ABNORMAL HIGH (ref 0.61–1.24)
GFR, Estimated: 7 mL/min — ABNORMAL LOW (ref >60)
Glucose, Bld: 141 mg/dL — ABNORMAL HIGH (ref 70–99)
Phosphorus: 4.9 mg/dL — ABNORMAL HIGH (ref 2.5–4.6)
Potassium: 4 mmol/L (ref 3.5–5.1)
Sodium: 133 mmol/L — ABNORMAL LOW (ref 135–145)

## 2022-03-19 LAB — GLUCOSE, CAPILLARY
Glucose-Capillary: 93 mg/dL (ref 70–99)
Glucose-Capillary: 104 mg/dL — ABNORMAL HIGH (ref 70–99)
Glucose-Capillary: 163 mg/dL — ABNORMAL HIGH (ref 70–99)
Glucose-Capillary: 115 mg/dL — ABNORMAL HIGH (ref 70–99)
Glucose-Capillary: 107 mg/dL — ABNORMAL HIGH (ref 70–99)
Glucose-Capillary: 120 mg/dL — ABNORMAL HIGH (ref 70–99)

## 2022-03-19 LAB — CBC
HCT: 28.1 % — ABNORMAL LOW (ref 39.0–52.0)
Hemoglobin: 9.1 g/dL — ABNORMAL LOW (ref 13.0–17.0)
MCH: 29.5 pg (ref 26.0–34.0)
MCHC: 32.4 g/dL (ref 30.0–36.0)
MCV: 91.2 fL (ref 80.0–100.0)
Platelets: 155 10*3/uL (ref 150–400)
RBC: 3.08 MIL/uL — ABNORMAL LOW (ref 4.22–5.81)
RDW: 14.7 % (ref 11.5–15.5)
WBC: 5.2 10*3/uL (ref 4.0–10.5)
nRBC: 0 % (ref 0.0–0.2)

## 2022-03-19 MED ORDER — CLOPIDOGREL BISULFATE 75 MG PO TABS: 75.0000 mg | ORAL_TABLET | Freq: Every day | ORAL | 0 refills | Status: AC

## 2022-03-19 MED ORDER — PANTOPRAZOLE SODIUM 40 MG PO TBEC: 40.0000 mg | DELAYED_RELEASE_TABLET | Freq: Every day | ORAL | 0 refills | Status: DC

## 2022-03-19 MED ORDER — DOXYCYCLINE HYCLATE 100 MG PO TABS
100.0000 mg | ORAL_TABLET | Freq: Two times a day (BID) | ORAL | Status: DC
  Administered 2022-03-19 – 2022-03-20 (×3): 100 mg via ORAL
  Filled 2022-03-19 (×3): qty 1

## 2022-03-19 MED ORDER — ATORVASTATIN CALCIUM 80 MG PO TABS: 80.0000 mg | ORAL_TABLET | Freq: Every day | ORAL | 0 refills | Status: DC

## 2022-03-19 MED ORDER — HYDRALAZINE HCL 100 MG PO TABS: 100.0000 mg | ORAL_TABLET | Freq: Three times a day (TID) | ORAL | 0 refills | Status: DC

## 2022-03-19 MED ORDER — TRAMADOL HCL 50 MG PO TABS: 50.0000 mg | ORAL_TABLET | Freq: Two times a day (BID) | ORAL | 0 refills | Status: DC | PRN

## 2022-03-19 MED ORDER — DOXYCYCLINE HYCLATE 100 MG PO TABS: 100.0000 mg | ORAL_TABLET | Freq: Two times a day (BID) | ORAL | 0 refills | Status: AC
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
## 2022-03-19 NOTE — Progress Notes (Signed)
Mobility Specialist Progress Note    03/19/22 1804  Mobility  Activity Refused mobility   Pt stated he will not get up because he just had pain meds that will make him fall and that someone will pay if he does, it is after six o'clock, and he just ate dinner.   Hildred Alamin Mobility Specialist  Primary: 5N M.S. Phone: [phone] Secondary: 6N M.S. Phone: [phone]

## 2022-03-19 NOTE — Progress Notes (Signed)
PHARMACIST LIPID MONITORING   William Webb is a 71 y.o. male admitted on 03/12/2022 with altered mental status.  Pharmacy has been consulted to optimize lipid-lowering therapy with the indication of secondary prevention for clinical ASCVD.  Recent Labs:  Lipid Panel (last 6 months):   Lab Results  Component Value Date   CHOL 139 03/15/2022   TRIG 127 03/15/2022   HDL 27 (L) 03/15/2022   CHOLHDL 5.1 03/15/2022   VLDL 25 03/15/2022   LDLCALC 87 03/15/2022    Hepatic function panel (last 6 months):   Lab Results  Component Value Date   AST 18 03/13/2022   ALT 10 03/13/2022   ALKPHOS 83 03/13/2022   BILITOT 0.8 03/13/2022    SCr (since admission):   Serum creatinine: 6.31 mg/dL (H) 03/18/22 2117 Estimated creatinine clearance: 12.5 mL/min (A)  Current therapy and lipid therapy tolerance Current lipid-lowering therapy: noncompliant with rosuvastatin Previous lipid-lowering therapies (if applicable): rosuvastatin Documented or reported allergies or intolerances to lipid-lowering therapies (if applicable): none  Assessment:   Patient is excluded from the protocol due to ESRD (ESRD, elevated LFTs, pregnancy/breastfeeding, active liver disease). Will change low dose rosuvastatin to atorvastatin based on suboptimal result of the AUROA trial.   Plan:    1.Statin intensity (high intensity recommended for all patients regardless of the LDL):  change to atorvastatin '80mg'$   2.Add ezetimibe (if any one of the following):   Not indicated at this time.  3.Refer to lipid clinic:   No  4.Follow-up with:  Primary care provider - Collene Leyden, MD  5.Follow-up labs after discharge:  Changes in lipid therapy were made. Check a lipid panel in 8-12 weeks then annually.     Erin Hearing PharmD., BCPS Clinical Pharmacist 03/19/2022 8:53 AM

## 2022-03-19 NOTE — Evaluation (Signed)
Occupational Therapy Evaluation Patient Details Name: William Webb [facility] MRN: [id number] DOB: 03-30-1951 Today's Date: 03/19/2022   History of Present Illness 71 y/o male admitted secondary to confusion and fever. 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 Pt is s/p RLE revascularization on 5/17 and s/p R second toe amputation on 5/19. PMH includes Glaucoma, CHF, DM, a fib, HTN, and ESRD on HD.   Clinical Impression   PTA, pt was living alone and was independent. Pt currently requiring Min Guard A for ADLs and functional mobility with RW. Pt adhering to WB precautions throughout. Providing education on compensatory techniques for LB dressing, management of post-op shoe, and tub transfer options. Pt verbalized understanding. Pt would benefit from further acute OT to facilitate safe dc. Recommend dc to home with HHOT for further OT to optimize safety, independence with ADLs, and return to PLOF.      Recommendations for follow up therapy are one component of a multi-disciplinary discharge planning process, led by the attending physician.  Recommendations may be updated based on patient status, additional functional criteria and insurance authorization.   Follow Up Recommendations  Home health OT    Assistance Recommended at Discharge Intermittent Supervision/Assistance  Patient can return home with the following A little help with bathing/dressing/bathroom;Assistance with cooking/housework;Assist for transportation    Functional Status Assessment  Patient has had a recent decline in their functional status and demonstrates the ability to make significant improvements in function in a reasonable and predictable amount of time.  Equipment Recommendations  BSC/3in1    Recommendations for Other Services       Precautions / Restrictions Precautions Precautions: Fall Required Braces or Orthoses: Other Brace Other Brace: post op shoe Restrictions Weight Bearing Restrictions: Yes RLE  Weight Bearing: Partial weight bearing RLE Partial Weight Bearing Percentage or Pounds: heel weightbearing in post op shoe      Mobility Bed Mobility Overal bed mobility: Modified Independent                  Transfers Overall transfer level: Needs assistance Equipment used: Rolling walker (2 wheels) Transfers: Sit to/from Stand Sit to Stand: Min guard           General transfer comment: Min Guard A for safety      Balance Overall balance assessment: Needs assistance Sitting-balance support: No upper extremity supported, Feet supported Sitting balance-Leahy Scale: Good     Standing balance support: Bilateral upper extremity supported, During functional activity Standing balance-Leahy Scale: Poor Standing balance comment: Reliant on BUE support                           ADL either performed or assessed with clinical judgement   ADL Overall ADL's : Needs assistance/impaired Eating/Feeding: Set up;Sitting   Grooming: Set up;Sitting   Upper Body Bathing: Supervision/ safety;Set up;Sitting   Lower Body Bathing: Min guard;Sit to/from stand   Upper Body Dressing : Supervision/safety;Set up;Sitting   Lower Body Dressing: Min guard;Sit to/from stand Lower Body Dressing Details (indicate cue type and reason): Educating pt on compensatory techqiues for LB dressing including donning pants/underwear and donning post-op shoe Toilet Transfer: Min guard;Ambulation;Rolling walker (2 wheels)         Tub/Shower Transfer Details (indicate cue type and reason): Educated on different tub transfer options: 1) sponge bath at sink, 2) sit with shower seat facing forward, 3) and steping over backwards. Recommending pt wait for [facility] therapist to practice tub transfers at home with  him as well as having family nearby for support. Functional mobility during ADLs: Min guard;Rolling walker (2 wheels) General ADL Comments: Focused on dressing and functional transfers. Provided  handouts for tub transfer and purchase of tub transfer bench     Vision         Perception     Praxis      Pertinent Vitals/Pain Pain Assessment Pain Assessment: Faces Faces Pain Scale: Hurts little more Pain Location: L foot Pain Descriptors / Indicators: Guarding, Grimacing Pain Intervention(s): Monitored during session, Limited activity within patient's tolerance, Repositioned     Hand Dominance     Extremity/Trunk Assessment Upper Extremity Assessment Upper Extremity Assessment: Overall WFL for tasks assessed   Lower Extremity Assessment Lower Extremity Assessment: Defer to PT evaluation RLE Deficits / Details: s/p R great toe amputation   Cervical / Trunk Assessment Cervical / Trunk Assessment: Normal   Communication Communication Communication: No difficulties   Cognition Arousal/Alertness: Awake/alert Behavior During Therapy: WFL for tasks assessed/performed Overall Cognitive Status: Within Functional Limits for tasks assessed                                 General Comments: Following commands and very agreeable to therapy. Slightly tangient but easy to redirect     General Comments  VSS    Exercises     Shoulder Instructions      Home Living Family/patient expects to be discharged to:: Private residence Living Arrangements: Alone Available Help at Discharge: Family Type of Home: Apartment Home Access: Level entry     Home Layout: One level     Bathroom Shower/Tub: Teacher, early years/pre: Standard     Home Equipment: Shower seat;Grab bars - tub/shower          Prior Functioning/Environment Prior Level of Function : Independent/Modified Independent;Driving                        OT Problem List: Decreased range of motion;Decreased strength;Decreased activity tolerance;Impaired balance (sitting and/or standing);Decreased knowledge of use of DME or AE;Decreased knowledge of precautions;Pain      OT  Treatment/Interventions: Self-care/ADL training;Therapeutic exercise;Energy conservation;DME and/or AE instruction;Therapeutic activities;Patient/family education    OT Goals(Current goals can be found in the care plan section) Acute Rehab OT Goals Patient Stated Goal: Go home OT Goal Formulation: With patient Time For Goal Achievement: 04/02/22 Potential to Achieve Goals: Good ADL Goals Pt Will Perform Lower Body Dressing: with modified independence;sit to/from stand Pt Will Transfer to Toilet: with modified independence;ambulating;regular height toilet Pt Will Perform Tub/Shower Transfer: Tub transfer;ambulating;shower seat;rolling walker;with modified independence  OT Frequency: Min 2X/week    Co-evaluation              AM-PAC OT "6 Clicks" Daily Activity     Outcome Measure Help from another person eating meals?: None Help from another person taking care of personal grooming?: A Little Help from another person toileting, which includes using toliet, bedpan, or urinal?: A Little Help from another person bathing (including washing, rinsing, drying)?: A Little Help from another person to put on and taking off regular upper body clothing?: A Little Help from another person to put on and taking off regular lower body clothing?: A Little 6 Click Score: 19   End of Session Equipment Utilized During Treatment: Gait belt;Rolling walker (2 wheels) Nurse Communication: Mobility status  Activity Tolerance: Patient tolerated treatment well  Patient left: in chair;with call bell/phone within reach (With transport team)  OT Visit Diagnosis: Unsteadiness on feet (R26.81);Other abnormalities of gait and mobility (R26.89);Muscle weakness (generalized) (M62.81);Pain Pain - Right/Left: Right Pain - part of body: Ankle and joints of foot                Time: 2984-7308 OT Time Calculation (min): 25 min Charges:  OT General Charges $OT Visit: 1 Visit OT Evaluation $OT Eval Moderate  Complexity: 1 Mod OT Treatments $Self Care/Home Management : 8-22 mins  Burnis Kaser MSOT, OTR/L Acute Rehab Pager: [phone] Office: [place] 03/19/2022, 1:36 PM

## 2022-03-19 NOTE — Progress Notes (Signed)
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 peroneal stenosis. Collateral flow noted at proximal ATA. Multiple vessels not well visualized secondary to patient's constant leg motion and squeezing/trapping of technologist's hand. Left: Diffuse calcific plaque noted throughout with turbulent flow. 30-49% proximal SFA stenosis, 50-74% mid SFA stenosis, and  30-49% stenosis noted distal ATA and mid PTA. Multiple vessels not  well visualized secondary to patient's constant leg motion and squeezing/trapping of technologist's hand.  See table(s) above for measurements and observations.    Preliminary      LOS: 6 days   Antonieta Pert, MD [facility]  03/19/2022, 7:31 AM

## 2022-03-19 NOTE — TOC Initial Note (Signed)
Transition of Care [facility]) - Initial/Assessment Note    Patient Details  Name: William Webb MRN: [id number] Date of Birth: [birth year]  [facility] CM/SW Contact:    Angelita Ingles, RN Phone Number:[phone]  03/19/2022, 3:49 PM  Clinical Narrative:                 TOC following patient with high risk for readmission. Patient states that he is from home where he normally functions independently. Patient states that he does not use any DME and has no difficulty. Patient reports that he is a hemodialysis patient and he follows with his PCP on a regular basis. Patient states that he does take his medications as prescribed and has no issues with obtaining meds. Per patient his daughter and nieces are very helpful and will do anything that he needs them to do to help him maintain at home alone.   TOC provided patient list for Home health choices for Home health recommendation. Patient states that he had [facility] before and would be ok with CM setting up home health services again.   Expected Discharge Plan: [place] Barriers to Discharge: No Barriers Identified   Patient Goals and CMS Choice Patient states their goals for this hospitalization and ongoing recovery are:: Ready to go home and get better CMS Medicare.gov Compare Post Acute Care list provided to:: Patient Choice offered to / list presented to : Patient  Expected Discharge Plan and Services Expected Discharge Plan: [place] In-house Referral: NA Discharge Planning Services: CM Consult Post Acute Care Choice: [place] arrangements for the past 2 months: Apartment Expected Discharge Date: 03/19/22               DME Arranged: N/A         HH Arranged: PT HH Agency: [place] Date [place]: 03/19/22 Time HH Agency Contacted: 18 Representative spoke with at [place]: Tommi Rumps  Prior Living Arrangements/Services Living arrangements  for the past 2 months: Apartment Lives with:: Self Patient language and need for interpreter reviewed:: Yes Do you feel safe going back to the place where you live?: Yes      Need for Family Participation in Patient Care: Yes (Comment) Care giver support system in place?: Yes (comment) Current home services:  (n/a) Criminal Activity/Legal Involvement Pertinent to Current Situation/Hospitalization: No - Comment as needed  Activities of Daily Living Home Assistive Devices/Equipment: Grab bars in shower, Shower chair with back, Wheelchair, Environmental consultant (specify type), Cane (specify quad or straight) ADL Screening (condition at time of admission) Patient's cognitive ability adequate to safely complete daily activities?: Yes Is the patient deaf or have difficulty hearing?: No Does the patient have difficulty seeing, even when wearing glasses/contacts?: No Does the patient have difficulty concentrating, remembering, or making decisions?: No Patient able to express need for assistance with ADLs?: Yes Does the patient have difficulty dressing or bathing?: No Independently performs ADLs?: Yes (appropriate for developmental age) Communication: Independent Dressing (OT): Independent Grooming: Independent Feeding: Independent Bathing: Independent Toileting: Independent In/Out Bed: Independent Walks in Home: Independent Does the patient have difficulty walking or climbing stairs?: No Weakness of Legs: None Weakness of Arms/Hands: None  Permission Sought/Granted Permission sought to share information with : Family Supports Permission granted to share information with : No              Emotional Assessment Appearance:: Appears stated age Attitude/Demeanor/Rapport: Gracious Affect (typically observed): Pleasant Orientation: :  Oriented to Self, Oriented to Place, Oriented to  Time, Oriented to Situation Alcohol / Substance Use: Not Applicable Psych Involvement: No (comment)  Admission  diagnosis:  Osteomyelitis (HCC) [M86.9] Encephalopathy [G93.40] Elevated troponin [R77.8] ESRD (end stage renal disease) on dialysis ([place]) [N18.6, Z99.2] SIRS (systemic inflammatory response syndrome) (HCC) [R65.10] Sepsis, due to unspecified organism, unspecified whether acute organ dysfunction present [facility]) [A41.9] Patient Active Problem List   Diagnosis Date Noted   Precordial pain    Critical limb ischemia of right lower extremity (HCC)    Acute osteomyelitis of toe, right ([place]) 03/13/2022   Osteomyelitis ([place]) 03/13/2022   Elevated troponin    SIRS (systemic inflammatory response syndrome) ([place]) 03/12/2022   ESRD (end stage renal disease) on dialysis ([place]) 10/19/2021   Community acquired pneumonia 02/01/2021   PNA (pneumonia) 02/01/2021   Hypoxia    Pleural effusion on right 12/02/2020   Pericardial effusion 11/02/2020   Loss of consciousness ([place]) 09/16/2020   Syncope and collapse 09/15/2020   CKD (chronic kidney disease), stage IV ([place]) 08/11/2020   Acute kidney injury superimposed on CKD ([place]) 07/31/2020   Hyperlipidemia associated with type 2 diabetes mellitus ([place]) 07/31/2020   Paroxysmal atrial fibrillation (HCC) 07/31/2020   Anemia of chronic disease 07/31/2020   Nausea and vomiting 07/31/2020   PAD (peripheral artery disease) ([place]) 03/19/2020   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, limited to breakdown of skin ([place]) 01/08/2020   Status post vascular surgery 12/07/2019   Respiratory failure ([place]) 12/07/2019   Persistent atrial fibrillation ([place]) 07/10/2019   H/O: GI bleed 07/10/2019   CKD (chronic kidney disease) stage 5, GFR less than 15 ml/min (HCC) 06/08/2019   Erectile dysfunction 05/06/2019   Coronary artery disease involving native coronary artery of native heart without angina pectoris 04/05/2019   Acute on chronic respiratory failure with hypoxia ([place]) 12/10/2018   Healthcare-associated pneumonia 12/10/2018   Hemoptysis 12/10/2018   Sepsis ([place])  12/10/2018   Acute respiratory failure with hypoxia (HCC) 12/10/2018   Malnutrition of moderate degree 12/02/2018   CAD (coronary artery disease) 12/01/2018   DOE (dyspnea on exertion) 11/18/2018   Hypertension associated with diabetes ([place])    Hyperlipidemia    Diabetes mellitus with renal complications ([place])    Noncompliance with medication regimen    Chronic pain syndrome 05/29/2018   Type 2 diabetes mellitus with stage 3 chronic kidney disease, with long-term current use of insulin ([place]) 04/07/2018   Stage 3a chronic kidney disease ([place]) 03/11/2018   Fall 11/20/2017   AKI (acute kidney injury) ([place]) 11/20/2017   Normocytic normochromic anemia 11/20/2017   Hypoglycemia 11/19/2017   Essential hypertension 11/19/2017   PCP:  Collene Leyden, MD Pharmacy:   [id number] [address]Phone: [phone]: [phone]    Social Determinants of Health (SDOH) Interventions    Readmission Risk Interventions    03/19/2022    3:43 PM 12/11/2019    3:51 PM  Readmission Risk Prevention Plan  Transportation Screening Complete Complete  PCP or Specialist Appt within 5-7 Days  Complete  Home Care Screening  Complete  Medication Review (RN CM)  Complete  Medication Review (RN Care Manager) Complete   PCP or Specialist appointment within 3-5 days of discharge Complete   HRI or [place]Complete   SW Recovery Care/Counseling Consult Complete   Palliative Care Screening Not AMineralNot Applicable

## 2022-03-19 NOTE — Progress Notes (Signed)
D/C order noted. Contacted [facility] SW to advise clinic of pt's d/c today and that pt should resume care on Wednesday.   Melven Sartorius Renal Navigator [phone]

## 2022-03-19 NOTE — Progress Notes (Signed)
PROGRESS NOTE [facility]  POE:[id number] DOB: 05-Dec-1950 DOA: 03/12/2022 PCP: Collene Leyden, MD   Brief Narrative/Hospital Course:  71 y.o. male with history of ESRD on hemodialysis, CAD, peripheral artery disease, diabetes mellitus, atrial fibrillation was brought to the ER after patient was found missing by patient's family.  Patient states he was driving out of the dialysis and was trying to get some food.  He does not remember being confused. Patient's family found patient in the driving lot sitting inside the car and was brought to the ER. During last dialysis session prior to 5/15 he briefly had a CODE and was briefly resuscitated at the time patient refused to come to the ER.  High sensitive troponin was 107, 117.  EKG shows normal sinus rhythm.  Had blood cultures drawn.  X-rays of the right foot was done because there was some discharge coming from the right second toe which shows features concerning for osteomyelitis.  Antibiotics have been started.  Patient was admitted for further work-up.  Cardiology podiatry and nephrology consulted. Had successful right TP trunk and medial RT common peroneal artery revascularization by Dr. Virgina Jock 5/17  Underwent right second digit partial amputation 5/19.  Postop patient was doing well had episode of chest pain likely from chest compressions for the past week.  EKG nonischemic.  Underwent dialysis per nephrology.  At this time we will discharge home.  Eliquis was resumed heparin discontinued.  Wound culture came back with multiple organism no Staph aureus no group A strep no anaerobes.   Subjective: Seen this am Wants to make sure he has DME before going home today Had no new complaints.  Assessment and Plan: Principal Problem:   Acute osteomyelitis of toe, right (HCC) Active Problems:   Diabetes mellitus with renal complications (HCC)   Persistent atrial fibrillation (HCC)   ESRD (end stage renal disease) on dialysis (HCC)   SIRS  (systemic inflammatory response syndrome) (HCC)   Osteomyelitis (HCC)   Critical limb ischemia of right lower extremity (HCC)   Precordial pain   Acute osteomyelitis of Rt 2nd toe PVD: S/P right TP trunk and medial RT common peroneal artery revascularization by Dr. Virgina Jock 5/17. Underwent right second digit partial amputation 5/19.  Continue Plavix.switched heparin to Eliquis discussed with cardiology.  Will discharge on oral antibiotic x1 week per pharmacy-culture data as above    Elevated troponin History of CAD Recent brief ?cardiac arrest in HD 5/12 Left sided Chest pain 5/20 a.m.:  Cardiology following closely-Echo with intact LV function.  Patient is on statin Plavix,Imdur,Coreg along with heparin drip> switched to Eliquis.  Chest pain likely musculoskeletal due to chest compression past week. Cardiology aware.   Acute metabolic encephalopathy likely from osteomyelitis/infection in the setting of multiple comorbidities. Mentation normal at baseline.    ESRD on HD MWF: Nephrology on board for dialysis-continue per nephro-he will get dialysis today.  MBD CKD: Calcium post stable at goal continue binders Anemia of ESRD: Hb overall stable,Monitor. ESA not due until 5/24 Recent Labs  Lab 03/16/22 1637 03/17/22 0739 03/18/22 0620 03/18/22 2117 03/19/22 0033  HGB 11.6* 9.5* 9.9* 9.6* 9.1*  HCT 34.0* 28.2* 30.0* 29.4* 28.1*    Diabetes mellitus with renal complications, blood sugar well controlled.  Keep on SSI.   Recent Labs  Lab 03/18/22 1600 03/18/22 2116 03/19/22 0625 03/19/22 0836 03/19/22 1512  GLUCAP 114* 114* 104* 163* 115*      Persistent atrial fibrillation: Rate controlled continue Coreg, Eliquis will be resumed  Hypertension urgency Hypertension: BP poorly controlled increased hydralazine.  Continue Imdur Lasix Coreg DVT prophylaxis: SCD's Start: 03/14/22 1940 heparin drip Code Status:   Code Status: Full Code Family Communication: plan of care discussed  with patient at bedside.   Patient status is: Inpatient because of ongoing management of osteomyelitis Level of care: Progressive Cardiac   Dispo: The patient is from: home            Anticipated disposition: Home  once home dme set up.  Mobility Assessment (last 72 hours)     Mobility Assessment     Row Name 03/19/22 1100 03/19/22 0924 03/19/22 0830 03/18/22 1932 03/17/22 1950   Does patient have an order for bedrest or is patient medically unstable -- No - Continue assessment No - Continue assessment No - Continue assessment No - Continue assessment   What is the highest level of mobility based on the progressive mobility assessment? Level 5 (Walks with assist in room/hall) - Balance while stepping forward/back and can walk in room with assist - Complete Level 5 (Walks with assist in room/hall) - Balance while stepping forward/back and can walk in room with assist - Complete Level 5 (Walks with assist in room/hall) - Balance while stepping forward/back and can walk in room with assist - Complete Level 5 (Walks with assist in room/hall) - Balance while stepping forward/back and can walk in room with assist - Complete Level 2 (Chairfast) - Balance while sitting on edge of bed and cannot stand   Is the above level different from baseline mobility prior to current illness? -- Yes - Recommend PT order Yes - Recommend PT order No - Consider discontinuing PT/OT No - Consider discontinuing PT/OT    Row Name 03/17/22 0800 03/17/22 0700 03/16/22 1955 03/16/22 1843     Does patient have an order for bedrest or is patient medically unstable No - Continue assessment No - Continue assessment No - Continue assessment No - Continue assessment    What is the highest level of mobility based on the progressive mobility assessment? -- Level 2 (Chairfast) - Balance while sitting on edge of bed and cannot stand Level 2 (Chairfast) - Balance while sitting on edge of bed and cannot stand Level 2 (Chairfast) - Balance  while sitting on edge of bed and cannot stand    Is the above level different from baseline mobility prior to current illness? -- No - Consider discontinuing PT/OT No - Consider discontinuing PT/OT --              Objective: Vitals last 24 hrs: Vitals:   03/19/22 1400 03/19/22 1430 03/19/22 1435 03/19/22 1441  BP: (!) 179/78 (!) 175/80 (!) 192/89 (!) 195/91  Pulse: 67 69 73 74  Resp: '18  18 17  '$ Temp:      TempSrc:      SpO2:      Weight:      Height:       Weight change:   Physical Examination: General exam: AAOX3, older than stated age, weak appearing. HEENT:Oral mucosa moist, Ear/Nose WNL grossly, dentition normal. Respiratory system: bilaterally diminished, no use of accessory muscle Cardiovascular system: S1 & S2 +, No JVD,. Gastrointestinal system: Abdomen soft,NT,ND,BS+ Nervous System:Alert, awake, moving extremities and grossly nonfocal Extremities: LE ankle edema none, distal peripheral pulses palpable.  Skin: No rashes,no icterus. MSK: Normal muscle bulk,tone, power   Medications reviewed:  Scheduled Meds:  apixaban  5 mg Oral BID   atorvastatin  80 mg Oral Daily  carvedilol  12.5 mg Oral BID   Chlorhexidine Gluconate Cloth  6 each Topical Q0600   clopidogrel  75 mg Oral Daily   doxercalciferol  3 mcg Intravenous Q M,W,F-HD   doxycycline  100 mg Oral Q12H   furosemide  80 mg Oral BID   gabapentin  300 mg Oral BID   hydrALAZINE  100 mg Oral TID    HYDROmorphone (DILAUDID) injection  0.5 mg Intravenous Once   insulin aspart  0-6 Units Subcutaneous TID WC   isosorbide mononitrate  60 mg Oral QHS   sevelamer carbonate  800 mg Oral TID with meals   sodium chloride flush  3 mL Intravenous Q12H   sodium chloride flush  3 mL Intravenous Q12H   sucroferric oxyhydroxide  500 mg Oral TID   tamsulosin  0.4 mg Oral QHS   vitamin B-12  5,000 mcg Oral Daily   Continuous Infusions:  sodium chloride 250 mL (03/16/22 1857)   sodium chloride 250 mL (03/17/22 2147)     Diet Order             Diet - low sodium heart healthy           Diet Carb Modified Fluid consistency: Thin; Room service appropriate? Yes  Diet effective now                  Intake/Output Summary (Last 24 hours) at 03/19/2022 1720 Last data filed at 03/19/2022 1435 Gross per 24 hour  Intake 303 ml  Output 2000 ml  Net -1697 ml    Net IO Since Admission: -1,332.82 mL [03/19/22 1720]  Wt Readings from Last 3 Encounters:  03/19/22 84.3 kg  12/05/21 84.3 kg  10/19/21 86.6 kg   Unresulted Labs (From admission, onward)     Start     Ordered   03/20/22 0500  CBC  Tomorrow morning,   R       Question:  Specimen collection method  Answer:  Lab=Lab collect   03/19/22 0834          Data Reviewed: I have personally reviewed following labs and imaging studies CBC: Recent Labs  Lab 03/12/22 2202 03/12/22 2216 03/16/22 0052 03/16/22 1637 03/17/22 0739 03/18/22 0620 03/18/22 2117 03/19/22 0033  WBC 10.5   < > 5.4  --  7.0 5.4 5.2 5.2  NEUTROABS 9.1*  --   --   --   --   --   --   --   HGB 12.0*   < > 9.8* 11.6* 9.5* 9.9* 9.6* 9.1*  HCT 36.7*   < > 30.2* 34.0* 28.2* 30.0* 29.4* 28.1*  MCV 93.9   < > 92.4  --  91.0 90.9 91.0 91.2  PLT 190   < > 164  --  170 167 164 155   < > = values in this interval not displayed.    Basic Metabolic Panel: Recent Labs  Lab 03/13/22 2211 03/14/22 0458 03/16/22 0052 03/16/22 1637 03/17/22 0739 03/18/22 0620 03/18/22 2117 03/19/22 1100  NA  --    < > 135 135 136 135 131* 133*  K  --    < > 3.5 4.0 4.1 3.7 3.5 4.0  CL  --    < > 96* 97* 98 97* 93* 93*  CO2  --    < > 26  --  '25 27 26 26  '$ GLUCOSE  --    < > 188* 93 108* 109* 115* 141*  BUN  --    < >  32* 36* 43* 21 27* 33*  CREATININE  --    < > 7.04* 8.30* 8.51* 5.31* 6.31* 7.22*  CALCIUM  --    < > 8.3*  --  8.2* 8.3* 8.3* 8.6*  PHOS 4.0  --   --   --   --   --  4.3 4.9*   < > = values in this interval not displayed.    GFR: Estimated Creatinine Clearance: 11 mL/min  (A) (by C-G formula based on SCr of 7.22 mg/dL (H)). Liver Function Tests: Recent Labs  Lab 03/12/22 2202 03/13/22 0336 03/18/22 2117 03/19/22 1100  AST 15 18  --   --   ALT 11 10  --   --   ALKPHOS 98 83  --   --   BILITOT 1.3* 0.8  --   --   PROT 8.2* 7.3  --   --   ALBUMIN 3.5 3.1* 2.4* 2.5*    No results for input(s): LIPASE, AMYLASE in the last 168 hours. No results for input(s): AMMONIA in the last 168 hours. Coagulation Profile: Recent Labs  Lab 03/12/22 2202  INR 1.3*    BNP (last 3 results) No results for input(s): PROBNP in the last 8760 hours. HbA1C: No results for input(s): HGBA1C in the last 72 hours. CBG: Recent Labs  Lab 03/18/22 1600 03/18/22 2116 03/19/22 0625 03/19/22 0836 03/19/22 1512  GLUCAP 114* 114* 104* 163* 115*    Lipid Profile: No results for input(s): CHOL, HDL, LDLCALC, TRIG, CHOLHDL, LDLDIRECT in the last 72 hours.  Thyroid Function Tests: No results for input(s): TSH, T4TOTAL, FREET4, T3FREE, THYROIDAB in the last 72 hours. Sepsis Labs: Recent Labs  Lab 03/12/22 2202  LATICACIDVEN 1.1     Recent Results (from the past 240 hour(s))  Blood Culture (routine x 2)     Status: None   Collection Time: 03/12/22 10:02 PM   Specimen: BLOOD  Result Value Ref Range Status   Specimen Description   Final    BLOOD BLOOD RIGHT FOREARM Performed at [facility] [address], [place], [place] [address]    Special Requests   Final    BOTTLES DRAWN AEROBIC AND ANAEROBIC Blood Culture adequate volume Performed at [facility] [address] [place], [place] [address]    Culture   Final    NO GROWTH 5 DAYS Performed at [facility], [place] [address] [place], [place] [address]    Report Status 03/18/2022 FINAL  Final  Resp Panel by RT-PCR (Flu A&B, Covid) Nasopharyngeal Swab     Status: None   Collection Time: 03/12/22 10:53 PM   Specimen: Nasopharyngeal Swab; Nasopharyngeal(NP) swabs in  vial transport medium  Result Value Ref Range Status   SARS Coronavirus 2 by RT PCR NEGATIVE NEGATIVE Final    Comment: (NOTE) SARS-CoV-2 target nucleic acids are NOT DETECTED.  The SARS-CoV-2 RNA is generally detectable in upper respiratory specimens during the acute phase of infection. The lowest concentration of SARS-CoV-2 viral copies this assay can detect is 138 copies/mL. A negative result does not preclude SARS-Cov-2 infection and should not be used as the sole basis for treatment or other patient management decisions. A negative result may occur with  improper specimen collection/handling, submission of specimen other than nasopharyngeal swab, presence of viral mutation(s) within the areas targeted by this assay, and inadequate number of viral copies(<138 copies/mL). A negative result must be combined with clinical observations, patient history, and epidemiological information. The  expected result is Negative.  Fact Sheet for Patients:  EntrepreneurPulse.com.au  Fact Sheet for Healthcare Providers:  IncredibleEmployment.be  This test is no t yet approved or cleared by the [place] FDA and  has been authorized for detection and/or diagnosis of SARS-CoV-2 by FDA under an Emergency Use Authorization (EUA). This EUA will remain  in effect (meaning this test can be used) for the duration of the COVID-19 declaration under Section 564(b)(1) of the Act, 21 U.S.C.section 360bbb-3(b)(1), unless the authorization is terminated  or revoked sooner.       Influenza A by PCR NEGATIVE NEGATIVE Final   Influenza B by PCR NEGATIVE NEGATIVE Final    Comment: (NOTE) The Xpert Xpress SARS-CoV-2/FLU/RSV plus assay is intended as an aid in the diagnosis of influenza from Nasopharyngeal swab specimens and should not be used as a sole basis for treatment. Nasal washings and aspirates are unacceptable for Xpert Xpress SARS-CoV-2/FLU/RSV testing.  Fact  Sheet for Patients: EntrepreneurPulse.com.au  Fact Sheet for Healthcare Providers: IncredibleEmployment.be  This test is not yet approved or cleared by the [place] FDA and has been authorized for detection and/or diagnosis of SARS-CoV-2 by FDA under an Emergency Use Authorization (EUA). This EUA will remain in effect (meaning this test can be used) for the duration of the COVID-19 declaration under Section 564(b)(1) of the Act, 21 U.S.C. section 360bbb-3(b)(1), unless the authorization is terminated or revoked.  Performed at [facility], [place] [address], [place], [place] [address]   Blood Culture (routine x 2)     Status: None   Collection Time: 03/12/22 11:30 PM   Specimen: BLOOD  Result Value Ref Range Status   Specimen Description   Final    BLOOD BLOOD RIGHT ARM Performed at [facility] [address], [place], [place] [address]    Special Requests   Final    BOTTLES DRAWN AEROBIC AND ANAEROBIC Blood Culture adequate volume Performed at [facility] [address], [place], [place] [address]    Culture   Final    NO GROWTH 5 DAYS Performed at [facility], [place] [address] [place], [place] [address]    Report Status 03/18/2022 FINAL  Final  MRSA Next Gen by PCR, Nasal     Status: None   Collection Time: 03/14/22  7:30 PM   Specimen: Nasal Mucosa; Nasal Swab  Result Value Ref Range Status   MRSA by PCR Next Gen NOT DETECTED NOT DETECTED Final    Comment: (NOTE) The GeneXpert MRSA Assay (FDA approved for NASAL specimens only), is one component of a comprehensive MRSA colonization surveillance program. It is not intended to diagnose MRSA infection nor to guide or monitor treatment for MRSA infections. Test performance is not FDA approved in patients less than 2 years old. Performed at [facility], [place] [address], [place], [place] [address]    Aerobic/Anaerobic Culture w Gram Stain (surgical/deep wound)     Status: Abnormal (Preliminary result)   Collection Time: 03/16/22  5:14 PM   Specimen: Soft Tissue, Other  Result Value Ref Range Status   Specimen Description WOUND  Final   Special Requests RIGHT FOOT WOUND  Final   Gram Stain   Final    FEW WBC PRESENT, PREDOMINANTLY MONONUCLEAR NO ORGANISMS SEEN Performed at [facility], [place] [address], [place], [place] [address]    Culture (A)  Final    MULTIPLE ORGANISMS PRESENT, NONE PREDOMINANT NO GROUP A STREP (  S.PYOGENES) ISOLATED NO STAPHYLOCOCCUS AUREUS ISOLATED NO ANAEROBES ISOLATED; CULTURE IN PROGRESS FOR 5 DAYS    Report Status PENDING  Incomplete     Antimicrobials: Anti-infectives (From admission, onward)    Start     Dose/Rate Route Frequency Ordered Stop   03/19/22 1000  doxycycline (VIBRA-TABS) tablet 100 mg        100 mg Oral Every 12 hours 03/19/22 0834 03/26/22 0959   03/19/22 0000  doxycycline (VIBRA-TABS) 100 MG tablet        100 mg Oral 2 times daily 03/19/22 1002 03/26/22 2359   03/14/22 1200  vancomycin (VANCOCIN) IVPB 1000 mg/200 mL premix  Status:  Discontinued        1,000 mg 200 mL/hr over 60 Minutes Intravenous Every M-W-F (Hemodialysis) 03/13/22 0941 03/19/22 0834   03/13/22 2300  ceFEPIme (MAXIPIME) 1 g in sodium chloride 0.9 % 100 mL IVPB  Status:  Discontinued        1 g 200 mL/hr over 30 Minutes Intravenous Every 24 hours 03/13/22 0938 03/19/22 0834   03/13/22 0342  vancomycin variable dose per unstable renal function (pharmacist dosing)  Status:  Discontinued         Does not apply See admin instructions 03/13/22 0343 03/13/22 0941   03/13/22 0000  ceFEPIme (MAXIPIME) 2 g in sodium chloride 0.9 % 100 mL IVPB  Status:  Discontinued        2 g 200 mL/hr over 30 Minutes Intravenous Every 24 hours 03/12/22 2256 03/13/22 0938   03/12/22 2300  vancomycin (VANCOCIN) IVPB 1000 mg/200 mL premix        1,000 mg 200 mL/hr over 60 Minutes  Intravenous  Once 03/12/22 2253 03/13/22 0132      Culture/Microbiology    Component Value Date/Time   SDES WOUND 03/16/2022 1714   SPECREQUEST RIGHT FOOT WOUND 03/16/2022 1714   CULT (A) 03/16/2022 1714    MULTIPLE ORGANISMS PRESENT, NONE PREDOMINANT NO GROUP A STREP (S.PYOGENES) ISOLATED NO STAPHYLOCOCCUS AUREUS ISOLATED NO ANAEROBES ISOLATED; CULTURE IN PROGRESS FOR 5 DAYS    REPTSTATUS PENDING 03/16/2022 1714    Other culture-see note  Radiology Studies: No results found.   LOS: 6 days   Antonieta Pert, MD [facility]  03/19/2022, 5:20 PM
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
## 2022-03-19 NOTE — Progress Notes (Signed)
Pharmacy Antibiotic Note  William Webb is a 71 y.o. male admitted on 03/12/2022 with  hx of ESRD on HD (last HD was today), here presenting with confusion and fever - found to have osteo. 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 Pharmacy has been consulted to transition to oral antibiotics- today is Day #7 of abx.  Afebrile, wbc normal. Cultures have been no growth.   Plan: Transition to doxycycline 100 mg bid x one week Per podiatry, likely home on ~1 week of oral abx depending on cx results   Height: '6\' 3"'$  (190.5 cm) Weight: 83.2 kg (183 lb 6.8 oz) IBW/kg (Calculated) : 84.5  Temp (24hrs), Avg:98.4 F (36.9 C), Min:98 F (36.7 C), Max:99 F (37.2 C)  Recent Labs  Lab 03/12/22 2202 03/12/22 2216 03/16/22 0052 03/16/22 1637 03/17/22 0739 03/18/22 0620 03/18/22 2117 03/19/22 0033  WBC 10.5   < > 5.4  --  7.0 5.4 5.2 5.2  CREATININE 5.65*   < > 7.04* 8.30* 8.51* 5.31* 6.31*  --   LATICACIDVEN 1.1  --   --   --   --   --   --   --    < > = values in this interval not displayed.     Estimated Creatinine Clearance: 12.5 mL/min (A) (by C-G formula based on SCr of 6.31 mg/dL (H)).    No Known Allergies  Antimicrobials this admission: 5/15 cefepime >>5/22 5/16 vancomycin >>5/22 Doxy 5/22>>5/27  Microbiology results: 5/15 BCx - ng 5/17 MRSA PCR - neg 5/19 R foot wound cx - mult org, none predominant  Thank you for allowing pharmacy to be a part of this patient's care.  Erin Hearing PharmD., BCPS Clinical Pharmacist 03/19/2022 8:37 AM

## 2022-03-19 NOTE — Evaluation (Signed)
Physical Therapy Evaluation Patient Details Name: William Webb MRN: [id number] DOB: 31-Oct-1950 Today's Date: 03/19/2022  History of Present Illness  Pt is a 71 y/o male admitted secondary to confusion and fever. 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 Pt is s/p RLE revascularization on 5/17 and s/p R second toe amputation on 5/19. PMH includes Glaucoma, CHF, DM, a fib, HTN, and ESRD on HD.  Clinical Impression  Pt admitted secondary to problem above with deficits below. Pt requiring min A for transfers and min guard for gait this session. Requiring cues for sequencing and maintaining heel weightbearing on RLE. Pt reports family can assist as needed at d/c. Recommending HHPT to address current mobility deficits. Will continue to follow acutely.       Recommendations for follow up therapy are one component of a multi-disciplinary discharge planning process, led by the attending physician.  Recommendations may be updated based on patient status, additional functional criteria and insurance authorization.  Follow Up Recommendations Home health PT    Assistance Recommended at Discharge Intermittent Supervision/Assistance  Patient can return home with the following  A little help with walking and/or transfers;A little help with bathing/dressing/bathroom;Assistance with cooking/housework;Assist for transportation;Help with stairs or ramp for entrance    Equipment Recommendations Rolling walker (2 wheels)  Recommendations for Other Services       Functional Status Assessment Patient has had a recent decline in their functional status and demonstrates the ability to make significant improvements in function in a reasonable and predictable amount of time.     Precautions / Restrictions Precautions Precautions: Fall Required Braces or Orthoses: Other Brace Other Brace: post op shoe Restrictions Weight Bearing Restrictions: Yes RLE Weight Bearing:  (heel weightbearing in post op  shoe)      Mobility  Bed Mobility Overal bed mobility: Modified Independent                  Transfers Overall transfer level: Needs assistance Equipment used: Rolling walker (2 wheels) Transfers: Sit to/from Stand Sit to Stand: Min assist           General transfer comment: Min A for lift assist and steadying. Cues to maintain heel weightbearing on RLE    Ambulation/Gait Ambulation/Gait assistance: Min guard Gait Distance (Feet): 30 Feet Assistive device: Rolling walker (2 wheels) Gait Pattern/deviations: Step-to pattern, Decreased step length - right, Decreased step length - left, Decreased weight shift to right, Antalgic       General Gait Details: Slow, cautious gait. cues for sequencing and to maintain heel weightbearing. Min guard for safety throughout.  Stairs            Wheelchair Mobility    Modified Rankin (Stroke Patients Only)       Balance Overall balance assessment: Needs assistance Sitting-balance support: No upper extremity supported, Feet supported Sitting balance-Leahy Scale: Good     Standing balance support: Bilateral upper extremity supported Standing balance-Leahy Scale: Poor Standing balance comment: Reliant on BUE support                             Pertinent Vitals/Pain Pain Assessment Pain Assessment: Faces Faces Pain Scale: Hurts little more Pain Location: L foot Pain Descriptors / Indicators: Guarding, Grimacing Pain Intervention(s): Limited activity within patient's tolerance, Monitored during session, Repositioned    Home Living Family/patient expects to be discharged to:: Private residence Living Arrangements: Alone Available Help at Discharge: Family Type of Home: Apartment Home Access:  Level entry       Home Layout: One level Home Equipment: Shower seat;Grab bars - tub/shower      Prior Function Prior Level of Function : Independent/Modified Independent                      Hand Dominance        Extremity/Trunk Assessment   Upper Extremity Assessment Upper Extremity Assessment: Defer to OT evaluation    Lower Extremity Assessment Lower Extremity Assessment: RLE deficits/detail RLE Deficits / Details: s/p R great toe amputation    Cervical / Trunk Assessment Cervical / Trunk Assessment: Normal  Communication   Communication: No difficulties  Cognition Arousal/Alertness: Awake/alert Behavior During Therapy: Anxious Overall Cognitive Status: No family/caregiver present to determine baseline cognitive functioning                                 General Comments: Slightly anxious with mobility tasks        General Comments      Exercises     Assessment/Plan    PT Assessment Patient needs continued PT services  PT Problem List Decreased strength;Decreased range of motion;Decreased activity tolerance;Decreased balance;Decreased mobility;Decreased knowledge of use of DME;Decreased knowledge of precautions;Pain       PT Treatment Interventions DME instruction;Gait training;Therapeutic activities;Functional mobility training;Therapeutic exercise;Balance training;Patient/family education    PT Goals (Current goals can be found in the Care Plan section)  Acute Rehab PT Goals Patient Stated Goal: to go home PT Goal Formulation: With patient Time For Goal Achievement: 04/02/22 Potential to Achieve Goals: Good    Frequency Min 3X/week     Co-evaluation               AM-PAC PT "6 Clicks" Mobility  Outcome Measure Help needed turning from your back to your side while in a flat bed without using bedrails?: None Help needed moving from lying on your back to sitting on the side of a flat bed without using bedrails?: None Help needed moving to and from a bed to a chair (including a wheelchair)?: A Little Help needed standing up from a chair using your arms (e.g., wheelchair or bedside chair)?: A Little Help needed to  walk in hospital room?: A Little Help needed climbing 3-5 steps with a railing? : A Lot 6 Click Score: 19    End of Session Equipment Utilized During Treatment: Gait belt;Other (comment) (post op shoe) Activity Tolerance: Patient tolerated treatment well Patient left: in bed;with call bell/phone within reach;with bed alarm set Nurse Communication: Mobility status PT Visit Diagnosis: Unsteadiness on feet (R26.81);Muscle weakness (generalized) (M62.81);Difficulty in walking, not elsewhere classified (R26.2);Pain Pain - Right/Left: Right Pain - part of body: Ankle and joints of foot    Time: 1610-9604 PT Time Calculation (min) (ACUTE ONLY): 20 min   Charges:   PT Evaluation $PT Eval Moderate Complexity: 1 Mod          Reuel Derby, PT, DPT  Acute Rehabilitation Services  Pager: [phone] Office: [phone]   Rudean Hitt 03/19/2022, 9:31 AM

## 2022-03-19 NOTE — TOC Transition Note (Signed)
Transition of Care [facility]) - CM/SW Discharge Note   Patient Details  Name: William Webb MRN: [id number] Date of Birth: January 14, 1951  [facility] CM/SW Contact:  Angelita Ingles, RN Phone Number:[phone]  03/19/2022, 3:54 PM   Clinical Narrative:    Home health has referral called to [facility]. Patient has been accepted by Alvis Lemmings to initiate home health services. No other needs noted at this time. TOC will sign off.    Final next level of care: [place] Barriers to Discharge: No Barriers Identified   Patient Goals and CMS Choice Patient states their goals for this hospitalization and ongoing recovery are:: Ready to go home and get better CMS Medicare.gov Compare Post Acute Care list provided to:: Patient Choice offered to / list presented to : Patient  Discharge Placement                       Discharge Plan and Services In-house Referral: NA Discharge Planning Services: CM Consult Post Acute Care Choice: Home Health          DME Arranged: N/A         HH Arranged: PT HH Agency: [place] Date [facility] Agency Contacted: 03/19/22 Time [place]: [id number] Representative spoke with at [place]: [place] ([place]) Interventions     Readmission Risk Interventions    03/19/2022    3:43 PM 12/11/2019    3:51 PM  Readmission Risk Prevention Plan  Transportation Screening Complete Complete  PCP or Specialist Appt within 5-7 Days  Complete  Home Care Screening  Complete  Medication Review (RN CM)  Complete  Medication Review ([place]) Complete   PCP or Specialist appointment within 3-5 days of discharge Complete   HRI or [place] Complete   SW Recovery Care/Counseling Consult Complete   [place] Not Applicable

## 2022-03-20 ENCOUNTER — Other Ambulatory Visit (HOSPITAL_COMMUNITY): Payer: Self-pay

## 2022-03-20 DIAGNOSIS — M86171 Other acute osteomyelitis, right ankle and foot: Secondary | ICD-10-CM | POA: Diagnosis not present

## 2022-03-20 LAB — CBC
HCT: 29.6 % — ABNORMAL LOW (ref 39.0–52.0)
Hemoglobin: 9.7 g/dL — ABNORMAL LOW (ref 13.0–17.0)
MCH: 29.8 pg (ref 26.0–34.0)
MCHC: 32.8 g/dL (ref 30.0–36.0)
MCV: 91.1 fL (ref 80.0–100.0)
Platelets: 159 10*3/uL (ref 150–400)
RBC: 3.25 MIL/uL — ABNORMAL LOW (ref 4.22–5.81)
RDW: 14.6 % (ref 11.5–15.5)
WBC: 4.6 10*3/uL (ref 4.0–10.5)
nRBC: 0 % (ref 0.0–0.2)

## 2022-03-20 LAB — SURGICAL PATHOLOGY

## 2022-03-20 LAB — GLUCOSE, CAPILLARY: Glucose-Capillary: 111 mg/dL — ABNORMAL HIGH (ref 70–99)

## 2022-03-20 MED ORDER — ATORVASTATIN CALCIUM 80 MG PO TABS: 80.0000 mg | ORAL_TABLET | Freq: Every day | ORAL | 1 refills | Status: DC

## 2022-03-20 NOTE — Progress Notes (Signed)
Spoke with patient's daughter via phone per her request from PT (patient agreed) - daughter shared she lives in [place] but helping facilitate his discharge - medication changes were discussed with her - she is aware new medications were sent to [facility] - shared she was contacting her cousins Neill Loft about updates and plan for discharge - patient's nephew will be coming to pick patient up, daughter shared that if nephew can't he will ensure someone does pick patient up before 6pm. Nephew's number confirmed in chart with daughter - also wrote down on sticky note to accompany patient to discharge lounge - daughter aware this is where patient will be when his ride comes to get him. Primary RN, Jonelle Sidle and Elmyra Ricks, updated on above.

## 2022-03-20 NOTE — Progress Notes (Signed)
Contacted [place] [facility] to advise clinic pt was d/c today and will resume care tomorrow.   Melven Sartorius Renal Navigator [phone]

## 2022-03-20 NOTE — Progress Notes (Signed)
Occupational Therapy Treatment Patient Details Name: William Webb [facility] MRN: [id number] DOB: Oct 11, 1951 Today's Date: 03/20/2022   History of present illness 71 y/o male admitted secondary to confusion and fever. 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 Pt is s/p RLE revascularization on 5/17 and s/p R second toe amputation on 5/19. PMH includes Glaucoma, CHF, DM, a fib, HTN, and ESRD on HD.   OT comments  Pt progressing towards established OT goals. Focused session on safe tub transfer. Pt demonstrating recall of education from prior session with Min cues. Pt performing simulated tub transfer with Min guard A, RW, and 3n1. Continue to recommend dc to home with HHOT and will continue to follow acutely as admitted.    Recommendations for follow up therapy are one component of a multi-disciplinary discharge planning process, led by the attending physician.  Recommendations may be updated based on patient status, additional functional criteria and insurance authorization.    Follow Up Recommendations  Home health OT    Assistance Recommended at Discharge Intermittent Supervision/Assistance  Patient can return home with the following  A little help with bathing/dressing/bathroom;Assistance with cooking/housework;Assist for transportation   Equipment Recommendations  BSC/3in1    Recommendations for Other Services      Precautions / Restrictions Precautions Precautions: Fall Required Braces or Orthoses: Other Brace Other Brace: post op shoe Restrictions Weight Bearing Restrictions: Yes RLE Weight Bearing: Partial weight bearing RLE Partial Weight Bearing Percentage or Pounds: heel weight bearing in post op shoe       Mobility Bed Mobility               General bed mobility comments: in recliner upon arrival    Transfers Overall transfer level: Needs assistance Equipment used: Rolling walker (2 wheels) Transfers: Sit to/from Stand Sit to Stand: Min guard            General transfer comment: Min Guard A for safety. Cues for hand placement     Balance Overall balance assessment: Needs assistance Sitting-balance support: No upper extremity supported, Feet supported Sitting balance-Leahy Scale: Good     Standing balance support: Bilateral upper extremity supported, During functional activity, No upper extremity supported Standing balance-Leahy Scale: Fair                             ADL either performed or assessed with clinical judgement   ADL Overall ADL's : Needs assistance/impaired                                 Tub/ Shower Transfer: Tub transfer;Min guard;Ambulation;BSC/3in1;Rolling walker (2 wheels) Tub/Shower Transfer Details (indicate cue type and reason): Provided demonstration for tub transfers (two different options). Pt performing simulated tub transfer with Min guard A to stop over with LLE first. Pt demonstrating understanding. Requiring Min cues. Able to maintain WB status. Recommending pt perform this with therapist at home before doing independently; pt agreed. Functional mobility during ADLs: Min guard;Rolling walker (2 wheels) General ADL Comments: Focused on functional transfers.    Extremity/Trunk Assessment Upper Extremity Assessment Upper Extremity Assessment: Overall WFL for tasks assessed   Lower Extremity Assessment Lower Extremity Assessment: Defer to PT evaluation RLE Deficits / Details: s/p R great toe amputation        Vision       Perception     Praxis      Cognition Arousal/Alertness: Awake/alert Behavior During  Therapy: WFL for tasks assessed/performed Overall Cognitive Status: Within Functional Limits for tasks assessed                                 General Comments: Following commands and very agreeable to therapy. Slightly tangient but easy to redirect        Exercises      Shoulder Instructions       General Comments VSS    Pertinent  Vitals/ Pain       Pain Assessment Pain Assessment: Faces Faces Pain Scale: Hurts little more Pain Location: L foot Pain Descriptors / Indicators: Guarding, Grimacing Pain Intervention(s): Limited activity within patient's tolerance, Monitored during session, Repositioned  Home Living                                          Prior Functioning/Environment              Frequency  Min 2X/week        Progress Toward Goals  OT Goals(current goals can now be found in the care plan section)  Progress towards OT goals: Progressing toward goals  Acute Rehab OT Goals OT Goal Formulation: With patient Time For Goal Achievement: 04/02/22 Potential to Achieve Goals: Good ADL Goals Pt Will Perform Lower Body Dressing: with modified independence;sit to/from stand Pt Will Transfer to Toilet: with modified independence;ambulating;regular height toilet Pt Will Perform Tub/Shower Transfer: Tub transfer;ambulating;shower seat;rolling walker;with modified independence  Plan Discharge plan remains appropriate    Co-evaluation                 AM-PAC OT "6 Clicks" Daily Activity     Outcome Measure   Help from another person eating meals?: None Help from another person taking care of personal grooming?: A Little Help from another person toileting, which includes using toliet, bedpan, or urinal?: A Little Help from another person bathing (including washing, rinsing, drying)?: A Little Help from another person to put on and taking off regular upper body clothing?: A Little Help from another person to put on and taking off regular lower body clothing?: A Little 6 Click Score: 19    End of Session Equipment Utilized During Treatment: Gait belt;Rolling walker (2 wheels);Other (comment) (post-op shoe)  OT Visit Diagnosis: Unsteadiness on feet (R26.81);Other abnormalities of gait and mobility (R26.89);Muscle weakness (generalized) (M62.81);Pain Pain - Right/Left:  Right Pain - part of body: Ankle and joints of foot   Activity Tolerance Patient tolerated treatment well   Patient Left in chair;with call bell/phone within reach   Nurse Communication Mobility status        Time: 2542-7062 OT Time Calculation (min): 22 min  Charges: OT General Charges $OT Visit: 1 Visit OT Treatments $Self Care/Home Management : 8-22 mins  [place], OTR/L Acute Rehab Pager: [phone] Office: [place] 03/20/2022, 11:59 AM

## 2022-03-20 NOTE — Progress Notes (Signed)
Physical Therapy Treatment Patient Details Name: William Webb MRN: [id number] DOB: 02/27/1951 Today's Date: 03/20/2022   History of Present Illness Pt is a 71 y/o male admitted 03/12/22 secondary to confusion and fever. 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 S/p RLE revascularization on 5/17; s/p R second toe amputation on 5/19. PMH includes Glaucoma, CHF, DM, a fib, HTN, ESRD on HD.   PT Comments    Pt progressing with mobility. Today's session focused on transfer and gait training with RW, as well as strategies/safety performing ADL tasks in preparation for d/c home today. Pt's DME delivered and adjusted to pt's height. Reviewed educ re: precautions, positioning, post-op shoe wear, activity recommendations, fall risk reduction. If pt to remain admitted, will continue to follow acutely to address established goals.     Recommendations for follow up therapy are one component of a multi-disciplinary discharge planning process, led by the attending physician.  Recommendations may be updated based on patient status, additional functional criteria and insurance authorization.  Follow Up Recommendations  Home health PT     Assistance Recommended at Discharge Intermittent Supervision/Assistance  Patient can return home with the following A little help with walking and/or transfers;A little help with bathing/dressing/bathroom;Assistance with cooking/housework;Assist for transportation;Help with stairs or ramp for entrance   Equipment Recommendations  Rolling walker (2 wheels);BSC/3in1 (delivered)    Recommendations for Other Services       Precautions / Restrictions Precautions Precautions: Fall Required Braces or Orthoses: Other Brace Other Brace: post op shoe Restrictions Weight Bearing Restrictions: Yes RLE Weight Bearing: Partial weight bearing RLE Partial Weight Bearing Percentage or Pounds: heel weight bearing in post op shoe     Mobility  Bed Mobility Overal bed  mobility: Modified Independent             General bed mobility comments: HOB elevated    Transfers Overall transfer level: Needs assistance Equipment used: Rolling walker (2 wheels) Transfers: Sit to/from Stand Sit to Stand: Min guard           General transfer comment: Min Guard A for safety. Cues for hand placement; good carryover for subsequent trials    Ambulation/Gait Ambulation/Gait assistance: Min guard Gait Distance (Feet): 36 Feet Assistive device: Rolling walker (2 wheels) Gait Pattern/deviations: Step-to pattern, Step-through pattern, Decreased weight shift to left, Trunk flexed, Antalgic Gait velocity: Decreased     General Gait Details: slow, guarded gait with RW and intermittent min guard for balance; cues for R foot WB through heel, intermittent cues for sequencing and upright posture; pt declines further distance secondary to fatigue   Stairs             Wheelchair Mobility    Modified Rankin (Stroke Patients Only)       Balance Overall balance assessment: Needs assistance Sitting-balance support: No upper extremity supported, Feet supported Sitting balance-Leahy Scale: Good Sitting balance - Comments: mod indep to don boxers, pants and post-op shoe sitting EOB   Standing balance support: Bilateral upper extremity supported, During functional activity, No upper extremity supported Standing balance-Leahy Scale: Fair Standing balance comment: can static stand without UE support to pull up boxers/pants                            Cognition Arousal/Alertness: Awake/alert Behavior During Therapy: WFL for tasks assessed/performed Overall Cognitive Status: No family/caregiver present to determine baseline cognitive functioning  General Comments: pleasant and agreeable. pt having difficulty understanding recommendation for HHPT over SNF, pt states, "If I want to go, I should be able to  just go"        Exercises      General Comments General comments (skin integrity, edema, etc.): increased time discussing recommendation for HHPT services (as opposed to SNF) and educ re: precautions, positioning, post-op shoe wear, edema control, activity recommnedations, fall risk reduction. pt's DME delivered during session, adjusted to pt's height      Pertinent Vitals/Pain Pain Assessment Pain Assessment: Faces Faces Pain Scale: Hurts a little bit Pain Location: L foot Pain Descriptors / Indicators: Guarding, Grimacing Pain Intervention(s): Monitored during session, Limited activity within patient's tolerance    Home Living                          Prior Function            PT Goals (current goals can now be found in the care plan section) Progress towards PT goals: Progressing toward goals    Frequency    Min 3X/week      PT Plan Current plan remains appropriate    Co-evaluation              AM-PAC PT "6 Clicks" Mobility   Outcome Measure  Help needed turning from your back to your side while in a flat bed without using bedrails?: None Help needed moving from lying on your back to sitting on the side of a flat bed without using bedrails?: None Help needed moving to and from a bed to a chair (including a wheelchair)?: A Little Help needed standing up from a chair using your arms (e.g., wheelchair or bedside chair)?: A Little Help needed to walk in hospital room?: A Little Help needed climbing 3-5 steps with a railing? : A Little 6 Click Score: 20    End of Session Equipment Utilized During Treatment: Gait belt;Other (comment) (L foot post-op shoe) Activity Tolerance: Patient tolerated treatment well Patient left: in chair;with call bell/phone within reach Nurse Communication: Mobility status PT Visit Diagnosis: Unsteadiness on feet (R26.81);Muscle weakness (generalized) (M62.81);Difficulty in walking, not elsewhere classified  (R26.2);Pain Pain - Right/Left: Right Pain - part of body: Ankle and joints of foot     Time: 1011-1048 PT Time Calculation (min) (ACUTE ONLY): 37 min  Charges:  $Therapeutic Activity: 8-22 mins $Self Care/Home Management: [place], PT, DPT Acute Rehabilitation Services  Pager [phone] Office [phone]  Derry Lory 03/20/2022, 12:13 PM

## 2022-03-20 NOTE — TOC Progression Note (Signed)
Transition of Care [facility]) - Progression Note    Patient Details  Name: William Webb MRN: [id number] Date of Birth: [birth year]  Transition of Care [facility]) CM/SW [place], RN Phone Number:[phone]  03/20/2022, 8:48 AM  Clinical Narrative:    CM received message stating that patient is now requesting a walker and wheelchair for discharge. CM at bedside to reassess patients needs due to patient stating yesterday that he was independent and had no DME needs. Patient is now stating that he would like a BSC and rolling walker. Orders have been entered and DME ordered per [facility].    Expected Discharge Plan: [place] Barriers to Discharge: No Barriers Identified  Expected Discharge Plan and Services Expected Discharge Plan: [place] In-house Referral: NA Discharge Planning Services: CM Consult Post Acute Care Choice: [place] arrangements for the past 2 months: Apartment Expected Discharge Date: 03/19/22               DME Arranged: N/A         HH Arranged: PT HH Agency: [place] Date [place]: 03/19/22 Time [place]: 1937 Representative spoke with at [place]: [place] ([place]) Interventions    Readmission Risk Interventions    03/19/2022    3:43 PM 12/11/2019    3:51 PM  Readmission Risk Prevention Plan  Transportation Screening Complete Complete  PCP or Specialist Appt within 5-7 Days  Complete  Home Care Screening  Complete  Medication Review (RN CM)  Complete  Medication Review (RN Care Manager) Complete   PCP or Specialist appointment within 3-5 days of discharge Complete   HRI or [place] Complete   SW Recovery Care/Counseling Consult Complete   [place] Not Applicable

## 2022-03-21 ENCOUNTER — Other Ambulatory Visit: Payer: Self-pay

## 2022-03-21 DIAGNOSIS — Z992 Dependence on renal dialysis: Secondary | ICD-10-CM | POA: Diagnosis not present

## 2022-03-21 DIAGNOSIS — N186 End stage renal disease: Secondary | ICD-10-CM | POA: Diagnosis not present

## 2022-03-21 DIAGNOSIS — E1122 Type 2 diabetes mellitus with diabetic chronic kidney disease: Secondary | ICD-10-CM

## 2022-03-21 DIAGNOSIS — N2581 Secondary hyperparathyroidism of renal origin: Secondary | ICD-10-CM | POA: Diagnosis not present

## 2022-03-21 LAB — AEROBIC/ANAEROBIC CULTURE W GRAM STAIN (SURGICAL/DEEP WOUND)

## 2022-03-21 NOTE — Patient Outreach (Signed)
[place] [facility]) Care Management  03/21/2022  [place] January 31, 1951 [id number]   Received hospital referral from Netta Cedars, RN for RN case manager for complex case management services. Assigned patient to Enzo Montgomery, RN for Follow up.  [place] Management Assistant [phone]

## 2022-03-22 ENCOUNTER — Other Ambulatory Visit: Payer: Self-pay

## 2022-03-22 NOTE — Patient Outreach (Signed)
[place] [facility]) Care Management  03/22/2022  [place] 10-11-1951 [id number]     Transition of Care Referral  Referral Date: 03/21/2022  Referral Source: [facility] Liaison Date of Discharge: 03/20/2022 Facility: [facility]     Outreach attempt # 1 to patient. Spoke with patient who was anxious to end call-stating he had a lot going on.  He reports that he is doing fairly well since returning home. His family is very supportive and assisting him as needed. He is scheduled to get [facility] services and anxiously awaiting their call. Patient confirms he has all meds and bale to review them. He has MD appts and family taking him until he resumes driving. [facility] services reviewed and discussed. Patient wanting someone to physically come out to the home which [facility] does not do. He was appreciative of call but did not wish for further calls.    Plan: RN CM will close referral.   Enzo Montgomery, RN,BSN,CCM [place] Management Telephonic Care Management Coordinator Direct Phone: [phone] Toll Free: [phone] Fax: [phone]

## 2022-03-23 DIAGNOSIS — Z992 Dependence on renal dialysis: Secondary | ICD-10-CM | POA: Diagnosis not present

## 2022-03-23 DIAGNOSIS — N2581 Secondary hyperparathyroidism of renal origin: Secondary | ICD-10-CM | POA: Diagnosis not present

## 2022-03-23 DIAGNOSIS — N186 End stage renal disease: Secondary | ICD-10-CM | POA: Diagnosis not present

## 2022-03-24 IMAGING — CT CT HEAD W/O CM
4 series · 16 of 47 positions shown, 18 images · non-contrast
Comparison: 09/15/2020

CLINICAL DATA: Head trauma, moderate-severe.  Fall, hit head



[Series 3: head wo · axial · 0.49mm/px · z∈[-105,+25]mm · 7 of 36 slices shown, 9 images]
[im 5/36  brain]
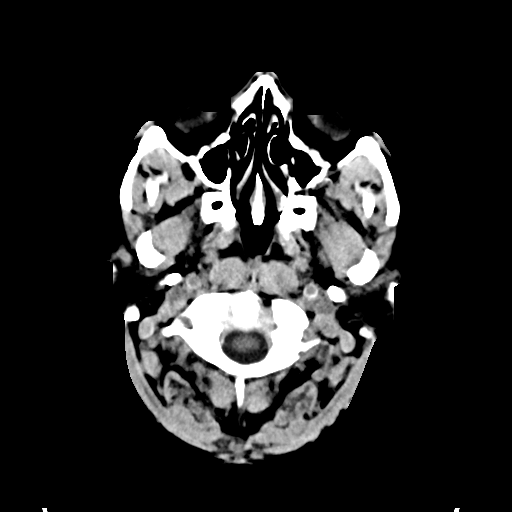
[im 5/36  bone]
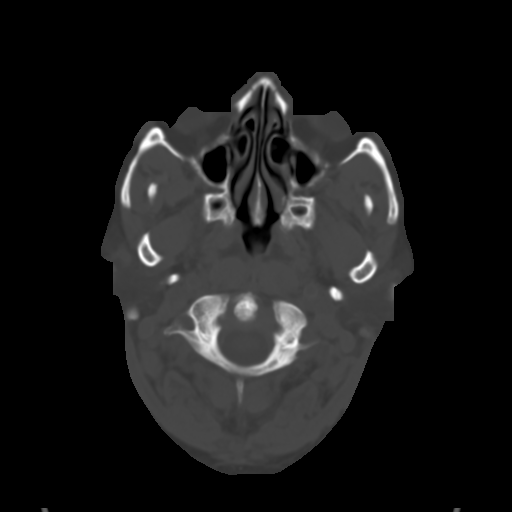
[im 9/36  brain]
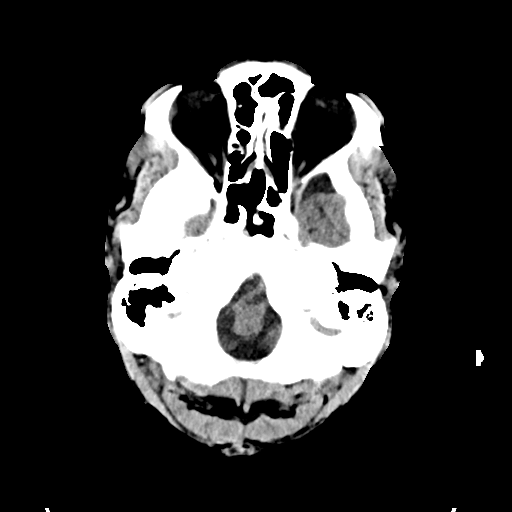
[im 14/36  brain]
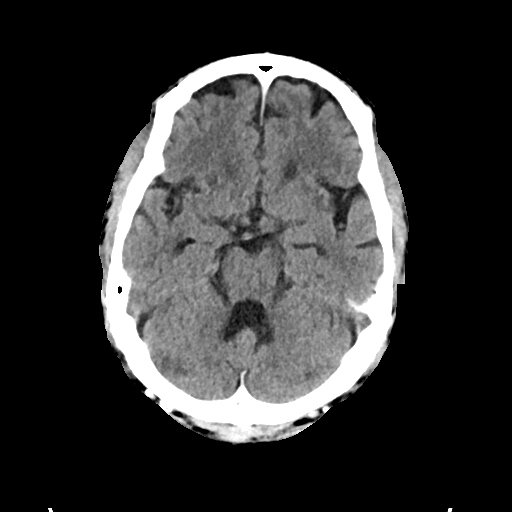
[im 18/36  brain]
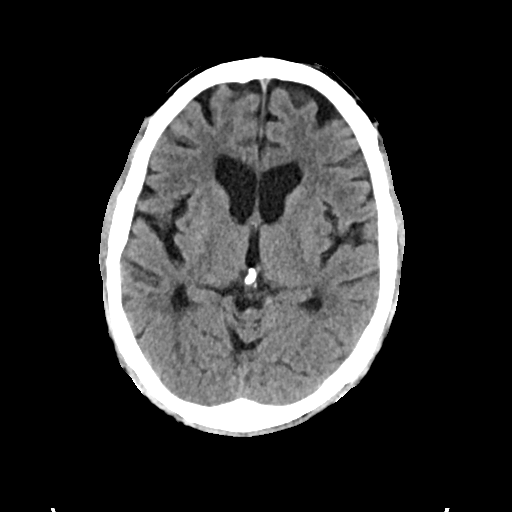
[im 22/36  brain]
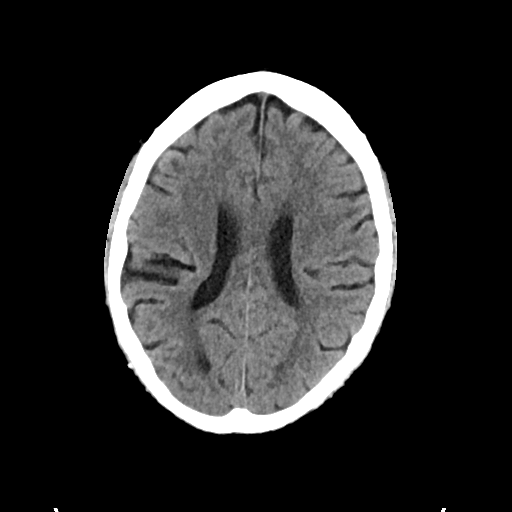
[im 22/36  bone]
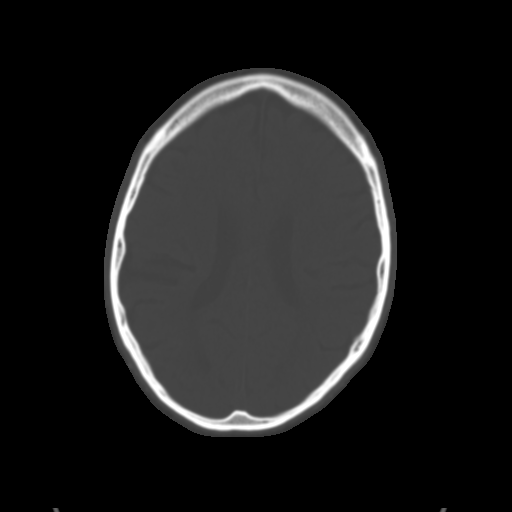
[im 27/36  brain]
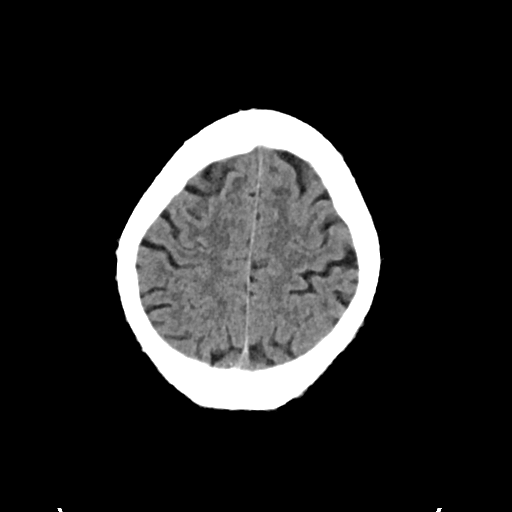
[im 31/36  brain]
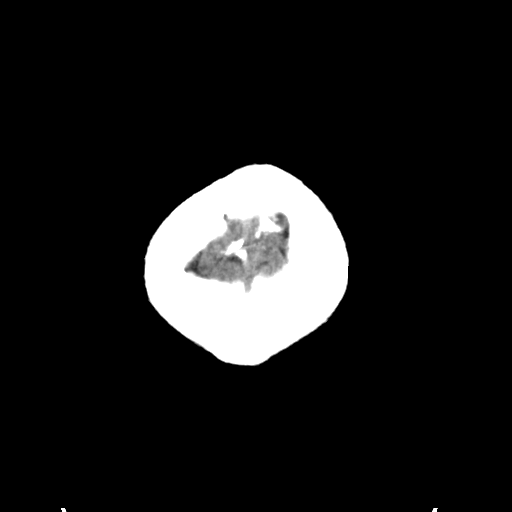

[Series 4: head bone · axial · 0.49mm/px · z∈[-109,-73]mm · 3 of 89 slices shown]
[im 9/89  bone]
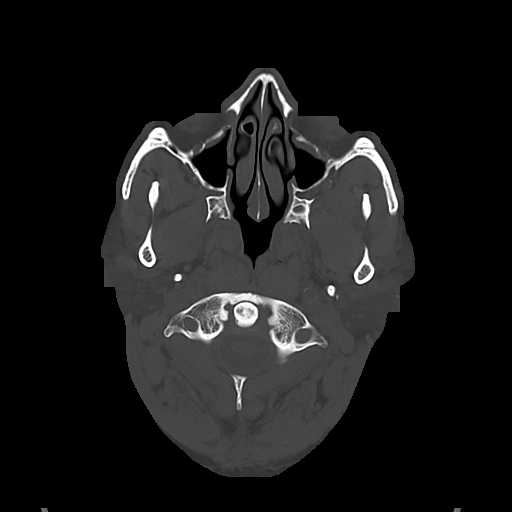
[im 18/89  bone]
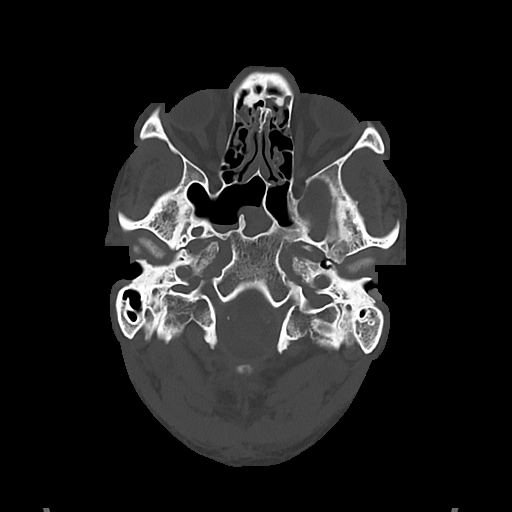
[im 27/89  bone]
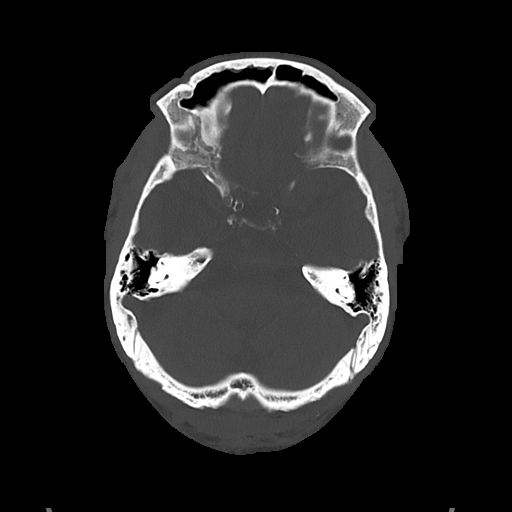

[Series 5: cor soft · coronal · 0.37mm/px · 3 of 72 slices shown]
[im 24/72  brain]
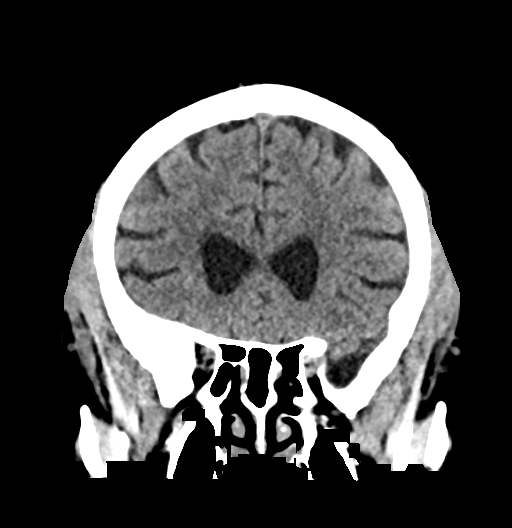
[im 32/72  brain]
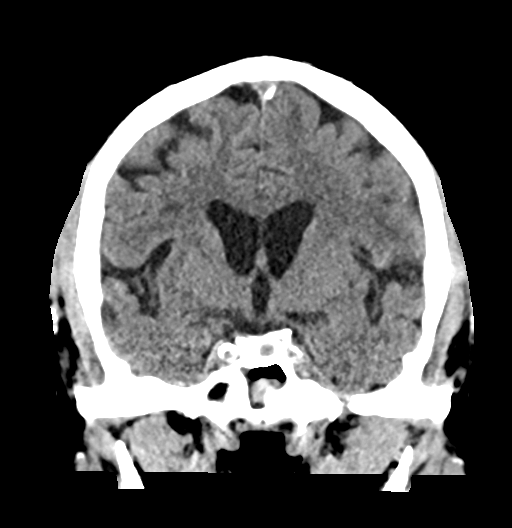
[im 40/72  brain]
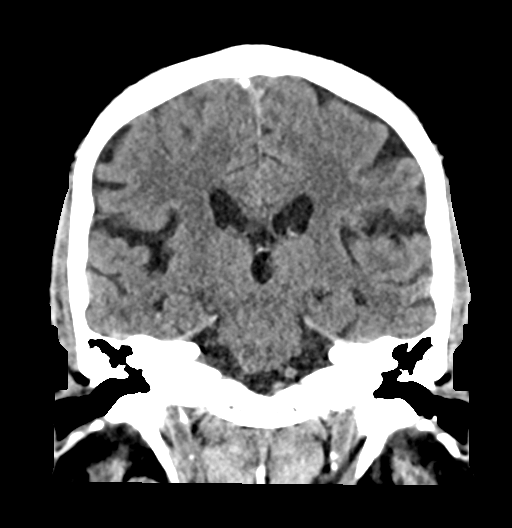

[Series 6: sag soft · sagittal · 0.38mm/px · 3 of 63 slices shown]
[im 21/63  brain]
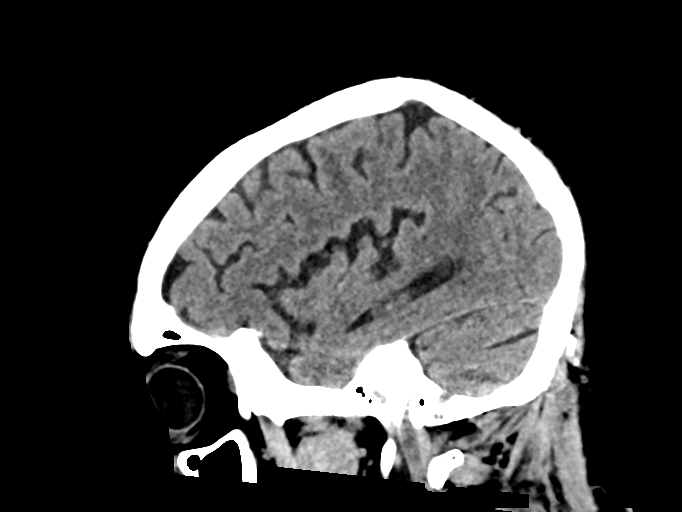
[im 32/63  brain]
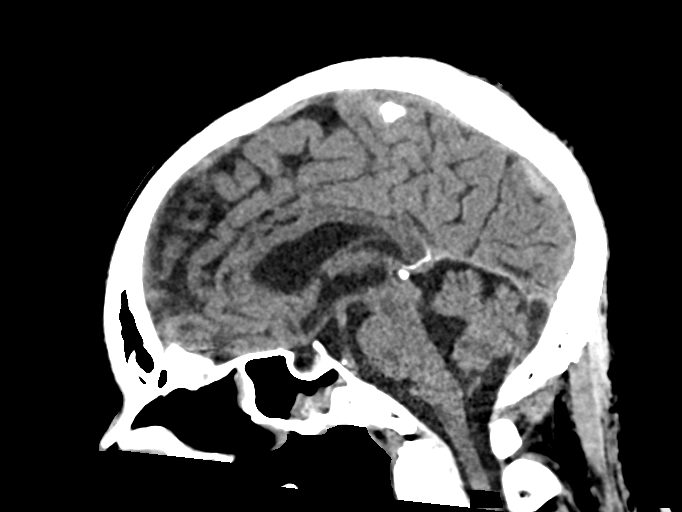
[im 42/63  brain]
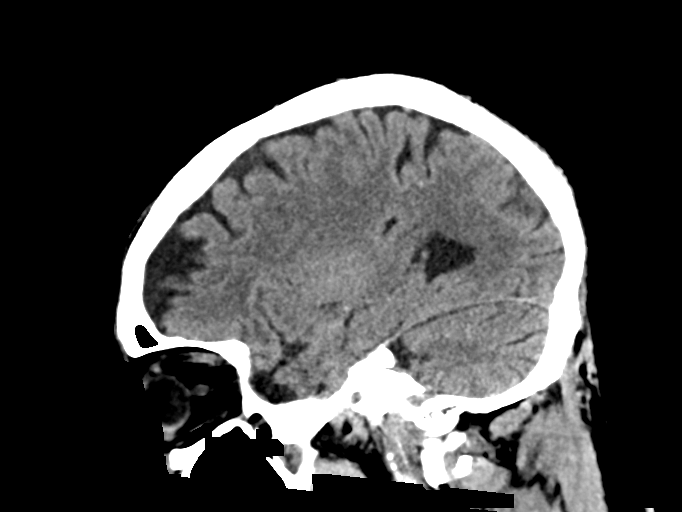

[16 of 47 positions shown; findings below may reference images not displayed]

FINDINGS: Brain: No acute intracranial abnormality. Specifically, no
hemorrhage, hydrocephalus, mass lesion, acute infarction, or
significant intracranial injury.

Vascular: No hyperdense vessel or unexpected calcification.

Skull: No acute calvarial abnormality.

Sinuses/Orbits: No acute findings.

Other: None
IMPRESSION: No acute intracranial abnormality.

## 2022-03-26 DIAGNOSIS — Z992 Dependence on renal dialysis: Secondary | ICD-10-CM | POA: Diagnosis not present

## 2022-03-26 DIAGNOSIS — N2581 Secondary hyperparathyroidism of renal origin: Secondary | ICD-10-CM | POA: Diagnosis not present

## 2022-03-26 DIAGNOSIS — N186 End stage renal disease: Secondary | ICD-10-CM | POA: Diagnosis not present

## 2022-03-27 DIAGNOSIS — E1169 Type 2 diabetes mellitus with other specified complication: Secondary | ICD-10-CM | POA: Diagnosis not present

## 2022-03-27 DIAGNOSIS — E1140 Type 2 diabetes mellitus with diabetic neuropathy, unspecified: Secondary | ICD-10-CM | POA: Diagnosis not present

## 2022-03-27 DIAGNOSIS — N186 End stage renal disease: Secondary | ICD-10-CM | POA: Diagnosis not present

## 2022-03-27 DIAGNOSIS — I469 Cardiac arrest, cause unspecified: Secondary | ICD-10-CM | POA: Diagnosis not present

## 2022-03-27 DIAGNOSIS — G9340 Encephalopathy, unspecified: Secondary | ICD-10-CM | POA: Diagnosis not present

## 2022-03-27 DIAGNOSIS — R413 Other amnesia: Secondary | ICD-10-CM | POA: Diagnosis not present

## 2022-03-27 DIAGNOSIS — I2510 Atherosclerotic heart disease of native coronary artery without angina pectoris: Secondary | ICD-10-CM | POA: Diagnosis not present

## 2022-03-27 DIAGNOSIS — Z89429 Acquired absence of other toe(s), unspecified side: Secondary | ICD-10-CM | POA: Diagnosis not present

## 2022-03-27 DIAGNOSIS — H35033 Hypertensive retinopathy, bilateral: Secondary | ICD-10-CM | POA: Diagnosis not present

## 2022-03-27 DIAGNOSIS — A419 Sepsis, unspecified organism: Secondary | ICD-10-CM | POA: Diagnosis not present

## 2022-03-27 DIAGNOSIS — E7800 Pure hypercholesterolemia, unspecified: Secondary | ICD-10-CM | POA: Diagnosis not present

## 2022-03-27 DIAGNOSIS — I5032 Chronic diastolic (congestive) heart failure: Secondary | ICD-10-CM | POA: Diagnosis not present

## 2022-03-28 ENCOUNTER — Ambulatory Visit: Payer: Medicare HMO | Admitting: Podiatry

## 2022-03-28 DIAGNOSIS — N186 End stage renal disease: Secondary | ICD-10-CM | POA: Diagnosis not present

## 2022-03-28 DIAGNOSIS — N2581 Secondary hyperparathyroidism of renal origin: Secondary | ICD-10-CM | POA: Diagnosis not present

## 2022-03-28 DIAGNOSIS — E1129 Type 2 diabetes mellitus with other diabetic kidney complication: Secondary | ICD-10-CM | POA: Diagnosis not present

## 2022-03-28 DIAGNOSIS — Z992 Dependence on renal dialysis: Secondary | ICD-10-CM | POA: Diagnosis not present

## 2022-03-29 ENCOUNTER — Ambulatory Visit (INDEPENDENT_AMBULATORY_CARE_PROVIDER_SITE_OTHER): Payer: Medicare HMO

## 2022-03-29 ENCOUNTER — Ambulatory Visit: Payer: Medicare HMO | Admitting: Podiatry

## 2022-03-29 ENCOUNTER — Encounter: Payer: Self-pay | Admitting: Podiatry

## 2022-03-29 DIAGNOSIS — Z9889 Other specified postprocedural states: Secondary | ICD-10-CM

## 2022-03-29 DIAGNOSIS — M86671 Other chronic osteomyelitis, right ankle and foot: Secondary | ICD-10-CM | POA: Diagnosis not present

## 2022-03-30 DIAGNOSIS — N2581 Secondary hyperparathyroidism of renal origin: Secondary | ICD-10-CM | POA: Diagnosis not present

## 2022-03-30 DIAGNOSIS — Z992 Dependence on renal dialysis: Secondary | ICD-10-CM | POA: Diagnosis not present

## 2022-03-30 DIAGNOSIS — N186 End stage renal disease: Secondary | ICD-10-CM | POA: Diagnosis not present

## 2022-04-01 NOTE — Progress Notes (Signed)
Subjective:   Patient ID: William Webb, male   DOB: 71 y.o.   MRN: [id number]   HPI Patient presents doing well with surgery very pleased so far   ROS      Objective:  Physical Exam  Neurovascular status intact negative Bevelyn Buckles' sign noted right second digit amputation healing well wound edges well coapted no drainage noted mild crusted tissue but localized with no other proximal pathology noted     Assessment:  Poor health individual with vascular disease neuropathic changes who has had a infection with amputation second digit right at the MPJ with removal of portion second metatarsal head     Plan:  H&P x-ray reviewed sterile dressing reapplied continue with open toed shoe and continue surgical dressing for 2 more weeks reappoint 4 suture staple removal instructed to call immediately if any issues were to occur  X-rays indicate that the skin is healing well no drainage no sign of pathology x-rays indicate satisfactory resection of bone no osteolysis proximal

## 2022-04-02 DIAGNOSIS — N2581 Secondary hyperparathyroidism of renal origin: Secondary | ICD-10-CM | POA: Diagnosis not present

## 2022-04-02 DIAGNOSIS — N186 End stage renal disease: Secondary | ICD-10-CM | POA: Diagnosis not present

## 2022-04-02 DIAGNOSIS — Z992 Dependence on renal dialysis: Secondary | ICD-10-CM | POA: Diagnosis not present

## 2022-04-04 DIAGNOSIS — Z992 Dependence on renal dialysis: Secondary | ICD-10-CM | POA: Diagnosis not present

## 2022-04-04 DIAGNOSIS — N2581 Secondary hyperparathyroidism of renal origin: Secondary | ICD-10-CM | POA: Diagnosis not present

## 2022-04-04 DIAGNOSIS — N186 End stage renal disease: Secondary | ICD-10-CM | POA: Diagnosis not present

## 2022-04-05 ENCOUNTER — Encounter: Payer: Medicare HMO | Admitting: Podiatry

## 2022-04-05 ENCOUNTER — Telehealth: Payer: Self-pay | Admitting: Podiatry

## 2022-04-05 NOTE — Telephone Encounter (Signed)
Patient wants to know if he can resume driving himself places ?  Please advise

## 2022-04-06 DIAGNOSIS — Z992 Dependence on renal dialysis: Secondary | ICD-10-CM | POA: Diagnosis not present

## 2022-04-06 DIAGNOSIS — N2581 Secondary hyperparathyroidism of renal origin: Secondary | ICD-10-CM | POA: Diagnosis not present

## 2022-04-06 DIAGNOSIS — N186 End stage renal disease: Secondary | ICD-10-CM | POA: Diagnosis not present

## 2022-04-06 NOTE — Telephone Encounter (Signed)
Called patient back and let him know that yes he can resume driving

## 2022-04-06 NOTE — Telephone Encounter (Signed)
yes

## 2022-04-09 ENCOUNTER — Encounter (HOSPITAL_COMMUNITY): Payer: Self-pay

## 2022-04-09 DIAGNOSIS — Z992 Dependence on renal dialysis: Secondary | ICD-10-CM | POA: Diagnosis not present

## 2022-04-09 DIAGNOSIS — N186 End stage renal disease: Secondary | ICD-10-CM | POA: Diagnosis not present

## 2022-04-09 DIAGNOSIS — N2581 Secondary hyperparathyroidism of renal origin: Secondary | ICD-10-CM | POA: Diagnosis not present

## 2022-04-11 DIAGNOSIS — N2581 Secondary hyperparathyroidism of renal origin: Secondary | ICD-10-CM | POA: Diagnosis not present

## 2022-04-11 DIAGNOSIS — N186 End stage renal disease: Secondary | ICD-10-CM | POA: Diagnosis not present

## 2022-04-11 DIAGNOSIS — Z992 Dependence on renal dialysis: Secondary | ICD-10-CM | POA: Diagnosis not present

## 2022-04-12 ENCOUNTER — Encounter: Payer: Self-pay | Admitting: Podiatry

## 2022-04-12 ENCOUNTER — Ambulatory Visit: Payer: Medicare HMO | Admitting: Podiatry

## 2022-04-12 ENCOUNTER — Ambulatory Visit (INDEPENDENT_AMBULATORY_CARE_PROVIDER_SITE_OTHER): Payer: Medicare HMO

## 2022-04-12 DIAGNOSIS — Z9889 Other specified postprocedural states: Secondary | ICD-10-CM | POA: Diagnosis not present

## 2022-04-13 ENCOUNTER — Ambulatory Visit: Payer: Medicare HMO | Admitting: Podiatry

## 2022-04-13 DIAGNOSIS — Z992 Dependence on renal dialysis: Secondary | ICD-10-CM | POA: Diagnosis not present

## 2022-04-13 DIAGNOSIS — N2581 Secondary hyperparathyroidism of renal origin: Secondary | ICD-10-CM | POA: Diagnosis not present

## 2022-04-13 DIAGNOSIS — N186 End stage renal disease: Secondary | ICD-10-CM | POA: Diagnosis not present

## 2022-04-13 NOTE — Progress Notes (Signed)
Subjective:   Patient ID: William Webb, male   DOB: 71 y.o.   MRN: [id number]   HPI Patient presents stating doing well with his right foot and stating that he is here for stitch removal   ROS      Objective:  Physical Exam  No change in neurovascular status crusted tissue on the right second metatarsal where the digit was removed at the MPJ with stitches that are intact no proximal edema erythema drainage noted     Assessment:  Patient is undergone amputation appears to be healing well     Plan:  Remove stitches there is some slight crusted tissue I tried to clean this up to the best of my ability I applied a small amount of Iodosorb on some wet area applied sterile dressing instructed on wet-to-dry soaks at home this should heal uneventfully.  Strict instructions of any changes were to occur to let us know hopeful this is the end of this problem  X-rays indicate satisfactory resection of bone and bone is clean at the current time with no pathology

## 2022-04-16 DIAGNOSIS — N186 End stage renal disease: Secondary | ICD-10-CM | POA: Diagnosis not present

## 2022-04-16 DIAGNOSIS — N2581 Secondary hyperparathyroidism of renal origin: Secondary | ICD-10-CM | POA: Diagnosis not present

## 2022-04-16 DIAGNOSIS — Z992 Dependence on renal dialysis: Secondary | ICD-10-CM | POA: Diagnosis not present

## 2022-04-18 DIAGNOSIS — Z992 Dependence on renal dialysis: Secondary | ICD-10-CM | POA: Diagnosis not present

## 2022-04-18 DIAGNOSIS — N186 End stage renal disease: Secondary | ICD-10-CM | POA: Diagnosis not present

## 2022-04-18 DIAGNOSIS — N2581 Secondary hyperparathyroidism of renal origin: Secondary | ICD-10-CM | POA: Diagnosis not present

## 2022-04-19 ENCOUNTER — Ambulatory Visit: Payer: Medicare HMO | Admitting: Cardiology

## 2022-04-19 ENCOUNTER — Encounter: Payer: Self-pay | Admitting: Cardiology

## 2022-04-19 VITALS — BP 141/60 | HR 85 | Temp 98.3°F | Resp 17 | Ht 75.0 in | Wt 182.6 lb

## 2022-04-19 DIAGNOSIS — I10 Essential (primary) hypertension: Secondary | ICD-10-CM

## 2022-04-19 DIAGNOSIS — I739 Peripheral vascular disease, unspecified: Secondary | ICD-10-CM | POA: Diagnosis not present

## 2022-04-19 MED ORDER — CLOPIDOGREL BISULFATE 75 MG PO TABS: 75.0000 mg | ORAL_TABLET | Freq: Every day | ORAL | 3 refills | Status: DC

## 2022-04-19 NOTE — Progress Notes (Signed)
Patient is here for follow up visit.  Subjective:   William Webb [facility], male    DOB: 29-Jun-1951, 71 y.o.   MRN: [id number]   Chief Complaint  Patient presents with   Coronary Artery Disease    6 MONTH   HPI  71 y.o. 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 He is feeling great and has had regular follow up with podiatry. However, it appears that he has not been taking plavix.   Current Outpatient Medications:    acetaminophen (TYLENOL) 650 MG CR tablet, Take 1,300 mg by mouth at bedtime., Disp: , Rfl:    Alcohol Swabs (B-D SINGLE USE SWABS REGULAR) PADS, , Disp: , Rfl:    apixaban (ELIQUIS) 5 MG TABS tablet, Take 1 tablet (5 mg total) by mouth 2 (two) times daily., Disp: 180 tablet, Rfl: 3   atorvastatin (LIPITOR) 80 MG tablet, Take 1 tablet (80 mg total) by mouth daily., Disp: 30 tablet, Rfl: 1   B Complex-C-Zn-Folic Acid (DIALYVITE 591 WITH ZINC) 0.8 MG TABS, SMARTSIG:1 Tablet(s) By Mouth Every Evening, Disp: , Rfl:    Blood Glucose Monitoring Suppl (ACCU-CHEK GUIDE ME) w/Device KIT, 1 Device by Does not apply route 4 (four) times daily -  before meals and at bedtime., Disp: 1 kit, Rfl: 0   Calcium Carbonate-Simethicone (ALKA-SELTZER HEARTBURN + GAS) 750-80 MG CHEW, Chew 1 tablet by mouth 3 (three) times daily as needed (indigestion/heartburn.)., Disp: , Rfl:    carvedilol (COREG) 25 MG tablet, Take 12.5 mg by mouth 2 (two) times daily., Disp: , Rfl:    Continuous Blood Gluc Sensor (DEXCOM G6 SENSOR) MISC, 1 Device by Does not apply route See admin instructions. Change every 10 days, Disp: 9 each, Rfl: 3   Continuous Blood Gluc Sensor (DEXCOM G6 SENSOR) MISC, 1 Device by Does not apply  route See admin instructions. Change every 10 days, Disp: 9 each, Rfl: 3   Cyanocobalamin (VITAMIN B-12) 5000 MCG TBDP, Take 5,000 mcg by mouth in the morning., Disp: , Rfl:    Dextromethorphan-guaiFENesin (CORICIDIN HBP CONGESTION/COUGH) 10-200 MG CAPS, Take 2 capsules by mouth daily as needed (cough/congestion)., Disp: , Rfl:    furosemide (LASIX) 80 MG tablet, Take 80 mg by mouth See admin instructions. Take 80 mg by mouth twice a day and additional 80 mg before bedtime as needed for swelling of the ankles, Disp: , Rfl:    gabapentin (NEURONTIN) 600 MG tablet, Take 0.5 tablets (300 mg total) by mouth 2 (two) times daily., Disp: 60 tablet, Rfl: 0   GEBAUERS PAIN EASE AERO, Apply topically., Disp: , Rfl:    glucose 4 GM chewable tablet, Chew 1 tablet by mouth as needed for low blood sugar., Disp: , Rfl:    hydrALAZINE (APRESOLINE) 50 MG tablet, Take 50 mg by mouth 2 (two) times daily., Disp: , Rfl:    isosorbide mononitrate (IMDUR) 60 MG 24 hr tablet, Take 1 tablet (60 mg total) by mouth daily. (Patient taking differently: Take 60 mg by mouth at bedtime.), Disp: 30 tablet, Rfl: 0   ketorolac (ACULAR) 0.5 % ophthalmic solution, Place 1 drop into both eyes daily as needed (inflamation)., Disp: , Rfl:    Lidocaine 4 % AERO, Apply 1 spray topically as  needed (prior to port being accessed.)., Disp: , Rfl:    lidocaine-prilocaine (EMLA) cream, 1 application as needed (prior to port being accessed)., Disp: , Rfl:    loratadine (CLARITIN) 10 MG tablet, Take 10 mg by mouth daily as needed for allergies., Disp: , Rfl:    NOVOLOG FLEXPEN 100 UNIT/ML FlexPen, INJECT 3 UNITS INTO THE SKIN 3 (THREE) TIMES DAILY WITH MEALS., Disp: 15 mL, Rfl: 11   ondansetron (ZOFRAN) 4 MG tablet, Take 1 tablet (4 mg total) by mouth every 8 (eight) hours as needed for nausea., Disp: 20 tablet, Rfl: 0   pantoprazole (PROTONIX) 40 MG tablet, Take 1 tablet (40 mg total) by mouth daily., Disp: 30 tablet, Rfl: 0   sevelamer carbonate  (RENVELA) 800 MG tablet, Take 800 mg by mouth with breakfast, with lunch, and with evening meal., Disp: , Rfl:    tamsulosin (FLOMAX) 0.4 MG CAPS capsule, Take 0.4 mg by mouth at bedtime., Disp: , Rfl:    traMADol (ULTRAM) 50 MG tablet, Take 1 tablet (50 mg total) by mouth every 12 (twelve) hours as needed for up to 8 doses., Disp: 8 tablet, Rfl: 0   VELPHORO 500 MG chewable tablet, Chew 500 mg by mouth 3 (three) times daily., Disp: , Rfl:    vitamin E 1000 UNIT capsule, Take 1,000 Units by mouth daily., Disp: , Rfl:    hydrALAZINE (APRESOLINE) 100 MG tablet, Take 1 tablet (100 mg total) by mouth 3 (three) times daily., Disp: 90 tablet, Rfl: 0   sildenafil (VIAGRA) 100 MG tablet, Take 1 tablet (100 mg total) by mouth daily as needed for erectile dysfunction., Disp: 10 tablet, Rfl: 11  Cardiovascular studies:  EKG 04/19/2022: Sinus rhythm 86 bpm Occasional ectopic ventricular beat    Voltage criteria for LVH   LEA duplex 03/17/2022: Right: 50-74% stenosis noted in the superficial femoral artery. 30-49%  stenosis noted in the peroneal artery. Diffuse calcific plaque noted  throughout with turbulent flow. 50-74% mid SFA stenosis, and 30-49% distal  peroneal stenosis. Collateral flow  noted at proximal ATA. Multiple vessels not well visualized secondary to  patient's constant leg motion and squeezing/trapping of technologist's  hand.   Left: Diffuse calcific plaque noted throughout with turbulent flow. 30-49%  proximal SFA stenosis, 50-74% mid SFA stenosis, and 30-49% stenosis noted  distal ATA and mid PTA. Multiple vessels not well visualized secondary to  patient's constant leg motion  and squeezing/trapping of technologist's hand.    PV intervention 03/14/2022: Rt TP trunk 80% stenosis Rt mid common peroneal artery 95% stenosis Prox occlusion of Rt ATA, Rt PT   Successful orbital atherectomy and PTA 3.0X60 mm balloon to Rt TP trunk and Rt mid common peroneal artery with 0% residual  stenosis   Expect improvement in foot circulation and healing post amputation at the level of toe or transmetatarsal amputation. If difficulty healing noted, could then consider revascularization to Rt ATA with retrograde access to Rt DP.   Coronary angiogram 10/20/2018: LM: Normal LAD: Distal 70% diffuse disease.        Ostial 90% stenosis in Diag1. Patent prox Diag 1 stent LCx: Large OM1 with patent prior stent RCA: 100% occluded RPDA        Partially successful PTCA attempt        100%--99% stenosis        TIMI flow 0-I  LVEDP 16 mmHg  Echocardiogram 10/17/2018: Left ventricle: The cavity size was normal. Wall thickness was increased in a pattern of severe LVH.  Systolic function was normal. The estimated ejection fraction was in the range of 50% to 55%. Wall motion was normal; there were no regional wall motion abnormalities. Doppler parameters are consistent with anormal left ventricular relaxation (grade 1 diastolic dysfunction). Doppler parameters are consistent with high ventricular filling pressure. - Left atrium: The atrium was moderately dilated. - Right atrium: The atrium was mildly dilated.   Impressions: - Low normal to mildly reduced LV systolic function; EF 50; severe LVH; mild diastolic dysfunction; biatrial enlargement.  Recent Labs: 01/19/2021: Hb 11.1 HbA1C 6.3% TSH 2.2  Review of Systems  Cardiovascular:  Negative for chest pain, dyspnea on exertion, leg swelling, palpitations and syncope.        Objective:    Vitals:   04/19/22 1254  BP: (!) 141/60  Pulse: 85  Resp: 17  Temp: 98.3 F (36.8 C)  SpO2: 98%   Physical Exam Vitals and nursing note reviewed.  Constitutional:      General: He is not in acute distress. Neck:     Vascular: No JVD.  Cardiovascular:     Rate and Rhythm: Normal rate and regular rhythm.     Pulses:          Dorsalis pedis pulses are 0 on the right side and 0 on the left side.       Posterior tibial pulses are 1+ on  the right side and 0 on the left side.     Heart sounds: Normal heart sounds. No murmur heard.    Comments: 2nd toe apmputation. Healing well ] Pulmonary:     Effort: Pulmonary effort is normal.     Breath sounds: Normal breath sounds. No wheezing or rales.  Musculoskeletal:     Right lower leg: No edema.     Left lower leg: No edema.           Assessment & Recommendations:   71 y.o. 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 CHA2DS2VASc score 4, annual stroke risk 5%. Continue eliquis 5 mg bid.   CAD: CAD s/p prior PCI, NSTEMI in 09/2018, attempted but unsuccessful. PTCA to chronically occluded Rt PDA Stable without angina symptoms.   Hypertension: Controlled  F/u in 6 months  [place], MD [facility] Pager: [phone] Office: [phone] If no answer Cell [phone]

## 2022-04-20 DIAGNOSIS — N2581 Secondary hyperparathyroidism of renal origin: Secondary | ICD-10-CM | POA: Diagnosis not present

## 2022-04-20 DIAGNOSIS — N186 End stage renal disease: Secondary | ICD-10-CM | POA: Diagnosis not present

## 2022-04-20 DIAGNOSIS — Z992 Dependence on renal dialysis: Secondary | ICD-10-CM | POA: Diagnosis not present

## 2022-04-23 DIAGNOSIS — N186 End stage renal disease: Secondary | ICD-10-CM | POA: Diagnosis not present

## 2022-04-23 DIAGNOSIS — Z992 Dependence on renal dialysis: Secondary | ICD-10-CM | POA: Diagnosis not present

## 2022-04-23 DIAGNOSIS — N2581 Secondary hyperparathyroidism of renal origin: Secondary | ICD-10-CM | POA: Diagnosis not present

## 2022-04-25 DIAGNOSIS — Z992 Dependence on renal dialysis: Secondary | ICD-10-CM | POA: Diagnosis not present

## 2022-04-25 DIAGNOSIS — N2581 Secondary hyperparathyroidism of renal origin: Secondary | ICD-10-CM | POA: Diagnosis not present

## 2022-04-25 DIAGNOSIS — N186 End stage renal disease: Secondary | ICD-10-CM | POA: Diagnosis not present

## 2022-04-27 DIAGNOSIS — Z992 Dependence on renal dialysis: Secondary | ICD-10-CM | POA: Diagnosis not present

## 2022-04-27 DIAGNOSIS — E1129 Type 2 diabetes mellitus with other diabetic kidney complication: Secondary | ICD-10-CM | POA: Diagnosis not present

## 2022-04-27 DIAGNOSIS — N2581 Secondary hyperparathyroidism of renal origin: Secondary | ICD-10-CM | POA: Diagnosis not present

## 2022-04-27 DIAGNOSIS — N186 End stage renal disease: Secondary | ICD-10-CM | POA: Diagnosis not present

## 2022-04-30 DIAGNOSIS — N2581 Secondary hyperparathyroidism of renal origin: Secondary | ICD-10-CM | POA: Diagnosis not present

## 2022-04-30 DIAGNOSIS — Z992 Dependence on renal dialysis: Secondary | ICD-10-CM | POA: Diagnosis not present

## 2022-04-30 DIAGNOSIS — N186 End stage renal disease: Secondary | ICD-10-CM | POA: Diagnosis not present

## 2022-05-02 DIAGNOSIS — N186 End stage renal disease: Secondary | ICD-10-CM | POA: Diagnosis not present

## 2022-05-02 DIAGNOSIS — N2581 Secondary hyperparathyroidism of renal origin: Secondary | ICD-10-CM | POA: Diagnosis not present

## 2022-05-02 DIAGNOSIS — Z992 Dependence on renal dialysis: Secondary | ICD-10-CM | POA: Diagnosis not present

## 2022-05-04 DIAGNOSIS — N186 End stage renal disease: Secondary | ICD-10-CM | POA: Diagnosis not present

## 2022-05-04 DIAGNOSIS — N2581 Secondary hyperparathyroidism of renal origin: Secondary | ICD-10-CM | POA: Diagnosis not present

## 2022-05-04 DIAGNOSIS — Z992 Dependence on renal dialysis: Secondary | ICD-10-CM | POA: Diagnosis not present

## 2022-05-07 DIAGNOSIS — N2581 Secondary hyperparathyroidism of renal origin: Secondary | ICD-10-CM | POA: Diagnosis not present

## 2022-05-07 DIAGNOSIS — Z992 Dependence on renal dialysis: Secondary | ICD-10-CM | POA: Diagnosis not present

## 2022-05-07 DIAGNOSIS — N186 End stage renal disease: Secondary | ICD-10-CM | POA: Diagnosis not present

## 2022-05-09 DIAGNOSIS — Z992 Dependence on renal dialysis: Secondary | ICD-10-CM | POA: Diagnosis not present

## 2022-05-09 DIAGNOSIS — N2581 Secondary hyperparathyroidism of renal origin: Secondary | ICD-10-CM | POA: Diagnosis not present

## 2022-05-09 DIAGNOSIS — N186 End stage renal disease: Secondary | ICD-10-CM | POA: Diagnosis not present

## 2022-05-10 ENCOUNTER — Ambulatory Visit: Payer: Medicare HMO

## 2022-05-10 DIAGNOSIS — I10 Essential (primary) hypertension: Secondary | ICD-10-CM | POA: Diagnosis not present

## 2022-05-10 DIAGNOSIS — I2510 Atherosclerotic heart disease of native coronary artery without angina pectoris: Secondary | ICD-10-CM | POA: Diagnosis not present

## 2022-05-10 DIAGNOSIS — E46 Unspecified protein-calorie malnutrition: Secondary | ICD-10-CM | POA: Diagnosis not present

## 2022-05-10 DIAGNOSIS — E7800 Pure hypercholesterolemia, unspecified: Secondary | ICD-10-CM | POA: Diagnosis not present

## 2022-05-10 DIAGNOSIS — E1140 Type 2 diabetes mellitus with diabetic neuropathy, unspecified: Secondary | ICD-10-CM | POA: Diagnosis not present

## 2022-05-10 DIAGNOSIS — I739 Peripheral vascular disease, unspecified: Secondary | ICD-10-CM

## 2022-05-10 DIAGNOSIS — Z89429 Acquired absence of other toe(s), unspecified side: Secondary | ICD-10-CM | POA: Diagnosis not present

## 2022-05-10 DIAGNOSIS — N186 End stage renal disease: Secondary | ICD-10-CM | POA: Diagnosis not present

## 2022-05-11 DIAGNOSIS — Z992 Dependence on renal dialysis: Secondary | ICD-10-CM | POA: Diagnosis not present

## 2022-05-11 DIAGNOSIS — N186 End stage renal disease: Secondary | ICD-10-CM | POA: Diagnosis not present

## 2022-05-11 DIAGNOSIS — N2581 Secondary hyperparathyroidism of renal origin: Secondary | ICD-10-CM | POA: Diagnosis not present

## 2022-05-14 DIAGNOSIS — Z992 Dependence on renal dialysis: Secondary | ICD-10-CM | POA: Diagnosis not present

## 2022-05-14 DIAGNOSIS — N2581 Secondary hyperparathyroidism of renal origin: Secondary | ICD-10-CM | POA: Diagnosis not present

## 2022-05-14 DIAGNOSIS — N186 End stage renal disease: Secondary | ICD-10-CM | POA: Diagnosis not present

## 2022-05-16 DIAGNOSIS — N2581 Secondary hyperparathyroidism of renal origin: Secondary | ICD-10-CM | POA: Diagnosis not present

## 2022-05-16 DIAGNOSIS — N186 End stage renal disease: Secondary | ICD-10-CM | POA: Diagnosis not present

## 2022-05-16 DIAGNOSIS — Z992 Dependence on renal dialysis: Secondary | ICD-10-CM | POA: Diagnosis not present

## 2022-05-18 DIAGNOSIS — N2581 Secondary hyperparathyroidism of renal origin: Secondary | ICD-10-CM | POA: Diagnosis not present

## 2022-05-18 DIAGNOSIS — N186 End stage renal disease: Secondary | ICD-10-CM | POA: Diagnosis not present

## 2022-05-18 DIAGNOSIS — Z992 Dependence on renal dialysis: Secondary | ICD-10-CM | POA: Diagnosis not present

## 2022-05-21 DIAGNOSIS — N2581 Secondary hyperparathyroidism of renal origin: Secondary | ICD-10-CM | POA: Diagnosis not present

## 2022-05-21 DIAGNOSIS — Z992 Dependence on renal dialysis: Secondary | ICD-10-CM | POA: Diagnosis not present

## 2022-05-21 DIAGNOSIS — N186 End stage renal disease: Secondary | ICD-10-CM | POA: Diagnosis not present

## 2022-05-23 DIAGNOSIS — N2581 Secondary hyperparathyroidism of renal origin: Secondary | ICD-10-CM | POA: Diagnosis not present

## 2022-05-23 DIAGNOSIS — N186 End stage renal disease: Secondary | ICD-10-CM | POA: Diagnosis not present

## 2022-05-23 DIAGNOSIS — Z992 Dependence on renal dialysis: Secondary | ICD-10-CM | POA: Diagnosis not present

## 2022-05-24 ENCOUNTER — Encounter: Payer: Self-pay | Admitting: Podiatry

## 2022-05-24 ENCOUNTER — Ambulatory Visit (INDEPENDENT_AMBULATORY_CARE_PROVIDER_SITE_OTHER): Payer: Medicare HMO | Admitting: Podiatry

## 2022-05-24 ENCOUNTER — Ambulatory Visit (INDEPENDENT_AMBULATORY_CARE_PROVIDER_SITE_OTHER): Payer: Medicare HMO

## 2022-05-24 DIAGNOSIS — Z9889 Other specified postprocedural states: Secondary | ICD-10-CM | POA: Diagnosis not present

## 2022-05-24 DIAGNOSIS — L309 Dermatitis, unspecified: Secondary | ICD-10-CM | POA: Diagnosis not present

## 2022-05-24 NOTE — Progress Notes (Signed)
Subjective:   Patient ID: William Webb, male   DOB: 71 y.o.   MRN: [id number]   HPI Patient presents concerned about some crusted tissue on his heels bilateral and states he is healing well from amputation surgery    ROS      Objective:  Physical Exam  Neurovascular status intact muscle strength adequate range of motion adequate second digit right showing crusted tissue where a digital amputation was performed with no indications of redness drainage and there is hypertrophic tissue on the posterior aspect of the heel region bilateral     Assessment:  Crusted tissue formation heel region bilateral with dermatological condition localized no breakdown of skin with well-healed surgical site second digit right     Plan:  Dermatitis bilateral localized no indications of any kind of infection or systemic pathology reviewed with patient recommended skin creams and gentle debridement but no sharp debridement to be done on this area

## 2022-05-25 DIAGNOSIS — N2581 Secondary hyperparathyroidism of renal origin: Secondary | ICD-10-CM | POA: Diagnosis not present

## 2022-05-25 DIAGNOSIS — N186 End stage renal disease: Secondary | ICD-10-CM | POA: Diagnosis not present

## 2022-05-25 DIAGNOSIS — Z992 Dependence on renal dialysis: Secondary | ICD-10-CM | POA: Diagnosis not present

## 2022-05-28 DIAGNOSIS — E1129 Type 2 diabetes mellitus with other diabetic kidney complication: Secondary | ICD-10-CM | POA: Diagnosis not present

## 2022-05-28 DIAGNOSIS — N2581 Secondary hyperparathyroidism of renal origin: Secondary | ICD-10-CM | POA: Diagnosis not present

## 2022-05-28 DIAGNOSIS — N186 End stage renal disease: Secondary | ICD-10-CM | POA: Diagnosis not present

## 2022-05-28 DIAGNOSIS — Z992 Dependence on renal dialysis: Secondary | ICD-10-CM | POA: Diagnosis not present

## 2022-05-30 DIAGNOSIS — Z992 Dependence on renal dialysis: Secondary | ICD-10-CM | POA: Diagnosis not present

## 2022-05-30 DIAGNOSIS — N2581 Secondary hyperparathyroidism of renal origin: Secondary | ICD-10-CM | POA: Diagnosis not present

## 2022-05-30 DIAGNOSIS — N186 End stage renal disease: Secondary | ICD-10-CM | POA: Diagnosis not present

## 2022-06-01 DIAGNOSIS — Z992 Dependence on renal dialysis: Secondary | ICD-10-CM | POA: Diagnosis not present

## 2022-06-01 DIAGNOSIS — N186 End stage renal disease: Secondary | ICD-10-CM | POA: Diagnosis not present

## 2022-06-01 DIAGNOSIS — N2581 Secondary hyperparathyroidism of renal origin: Secondary | ICD-10-CM | POA: Diagnosis not present

## 2022-06-04 DIAGNOSIS — N186 End stage renal disease: Secondary | ICD-10-CM | POA: Diagnosis not present

## 2022-06-04 DIAGNOSIS — Z992 Dependence on renal dialysis: Secondary | ICD-10-CM | POA: Diagnosis not present

## 2022-06-04 DIAGNOSIS — N2581 Secondary hyperparathyroidism of renal origin: Secondary | ICD-10-CM | POA: Diagnosis not present

## 2022-06-06 DIAGNOSIS — N2581 Secondary hyperparathyroidism of renal origin: Secondary | ICD-10-CM | POA: Diagnosis not present

## 2022-06-06 DIAGNOSIS — Z992 Dependence on renal dialysis: Secondary | ICD-10-CM | POA: Diagnosis not present

## 2022-06-06 DIAGNOSIS — N186 End stage renal disease: Secondary | ICD-10-CM | POA: Diagnosis not present

## 2022-06-08 DIAGNOSIS — N186 End stage renal disease: Secondary | ICD-10-CM | POA: Diagnosis not present

## 2022-06-08 DIAGNOSIS — N2581 Secondary hyperparathyroidism of renal origin: Secondary | ICD-10-CM | POA: Diagnosis not present

## 2022-06-08 DIAGNOSIS — Z992 Dependence on renal dialysis: Secondary | ICD-10-CM | POA: Diagnosis not present

## 2022-06-11 DIAGNOSIS — N2581 Secondary hyperparathyroidism of renal origin: Secondary | ICD-10-CM | POA: Diagnosis not present

## 2022-06-11 DIAGNOSIS — N186 End stage renal disease: Secondary | ICD-10-CM | POA: Diagnosis not present

## 2022-06-11 DIAGNOSIS — Z992 Dependence on renal dialysis: Secondary | ICD-10-CM | POA: Diagnosis not present

## 2022-06-13 DIAGNOSIS — Z992 Dependence on renal dialysis: Secondary | ICD-10-CM | POA: Diagnosis not present

## 2022-06-13 DIAGNOSIS — N2581 Secondary hyperparathyroidism of renal origin: Secondary | ICD-10-CM | POA: Diagnosis not present

## 2022-06-13 DIAGNOSIS — N186 End stage renal disease: Secondary | ICD-10-CM | POA: Diagnosis not present

## 2022-06-15 DIAGNOSIS — N186 End stage renal disease: Secondary | ICD-10-CM | POA: Diagnosis not present

## 2022-06-15 DIAGNOSIS — N2581 Secondary hyperparathyroidism of renal origin: Secondary | ICD-10-CM | POA: Diagnosis not present

## 2022-06-15 DIAGNOSIS — Z992 Dependence on renal dialysis: Secondary | ICD-10-CM | POA: Diagnosis not present

## 2022-06-18 DIAGNOSIS — Z992 Dependence on renal dialysis: Secondary | ICD-10-CM | POA: Diagnosis not present

## 2022-06-18 DIAGNOSIS — N2581 Secondary hyperparathyroidism of renal origin: Secondary | ICD-10-CM | POA: Diagnosis not present

## 2022-06-18 DIAGNOSIS — N186 End stage renal disease: Secondary | ICD-10-CM | POA: Diagnosis not present

## 2022-06-20 DIAGNOSIS — Z992 Dependence on renal dialysis: Secondary | ICD-10-CM | POA: Diagnosis not present

## 2022-06-20 DIAGNOSIS — N2581 Secondary hyperparathyroidism of renal origin: Secondary | ICD-10-CM | POA: Diagnosis not present

## 2022-06-20 DIAGNOSIS — N186 End stage renal disease: Secondary | ICD-10-CM | POA: Diagnosis not present

## 2022-06-21 DIAGNOSIS — C678 Malignant neoplasm of overlapping sites of bladder: Secondary | ICD-10-CM | POA: Diagnosis not present

## 2022-06-22 DIAGNOSIS — N2581 Secondary hyperparathyroidism of renal origin: Secondary | ICD-10-CM | POA: Diagnosis not present

## 2022-06-22 DIAGNOSIS — N186 End stage renal disease: Secondary | ICD-10-CM | POA: Diagnosis not present

## 2022-06-22 DIAGNOSIS — Z992 Dependence on renal dialysis: Secondary | ICD-10-CM | POA: Diagnosis not present

## 2022-06-25 DIAGNOSIS — Z992 Dependence on renal dialysis: Secondary | ICD-10-CM | POA: Diagnosis not present

## 2022-06-25 DIAGNOSIS — N186 End stage renal disease: Secondary | ICD-10-CM | POA: Diagnosis not present

## 2022-06-25 DIAGNOSIS — N2581 Secondary hyperparathyroidism of renal origin: Secondary | ICD-10-CM | POA: Diagnosis not present

## 2022-06-27 DIAGNOSIS — N186 End stage renal disease: Secondary | ICD-10-CM | POA: Diagnosis not present

## 2022-06-27 DIAGNOSIS — N2581 Secondary hyperparathyroidism of renal origin: Secondary | ICD-10-CM | POA: Diagnosis not present

## 2022-06-27 DIAGNOSIS — Z992 Dependence on renal dialysis: Secondary | ICD-10-CM | POA: Diagnosis not present

## 2022-06-28 DIAGNOSIS — Z992 Dependence on renal dialysis: Secondary | ICD-10-CM | POA: Diagnosis not present

## 2022-06-28 DIAGNOSIS — N186 End stage renal disease: Secondary | ICD-10-CM | POA: Diagnosis not present

## 2022-06-28 DIAGNOSIS — E1129 Type 2 diabetes mellitus with other diabetic kidney complication: Secondary | ICD-10-CM | POA: Diagnosis not present

## 2022-06-29 DIAGNOSIS — Z992 Dependence on renal dialysis: Secondary | ICD-10-CM | POA: Diagnosis not present

## 2022-06-29 DIAGNOSIS — N186 End stage renal disease: Secondary | ICD-10-CM | POA: Diagnosis not present

## 2022-06-29 DIAGNOSIS — N2581 Secondary hyperparathyroidism of renal origin: Secondary | ICD-10-CM | POA: Diagnosis not present

## 2022-07-02 DIAGNOSIS — I4819 Other persistent atrial fibrillation: Secondary | ICD-10-CM | POA: Diagnosis not present

## 2022-07-02 DIAGNOSIS — Z992 Dependence on renal dialysis: Secondary | ICD-10-CM | POA: Diagnosis not present

## 2022-07-02 DIAGNOSIS — N2581 Secondary hyperparathyroidism of renal origin: Secondary | ICD-10-CM | POA: Diagnosis not present

## 2022-07-02 DIAGNOSIS — N186 End stage renal disease: Secondary | ICD-10-CM | POA: Diagnosis not present

## 2022-07-03 ENCOUNTER — Encounter: Payer: Self-pay | Admitting: Gastroenterology

## 2022-07-04 DIAGNOSIS — Z992 Dependence on renal dialysis: Secondary | ICD-10-CM | POA: Diagnosis not present

## 2022-07-04 DIAGNOSIS — N186 End stage renal disease: Secondary | ICD-10-CM | POA: Diagnosis not present

## 2022-07-04 DIAGNOSIS — N2581 Secondary hyperparathyroidism of renal origin: Secondary | ICD-10-CM | POA: Diagnosis not present

## 2022-07-06 DIAGNOSIS — N2581 Secondary hyperparathyroidism of renal origin: Secondary | ICD-10-CM | POA: Diagnosis not present

## 2022-07-06 DIAGNOSIS — Z992 Dependence on renal dialysis: Secondary | ICD-10-CM | POA: Diagnosis not present

## 2022-07-06 DIAGNOSIS — N186 End stage renal disease: Secondary | ICD-10-CM | POA: Diagnosis not present

## 2022-07-09 DIAGNOSIS — Z992 Dependence on renal dialysis: Secondary | ICD-10-CM | POA: Diagnosis not present

## 2022-07-09 DIAGNOSIS — N186 End stage renal disease: Secondary | ICD-10-CM | POA: Diagnosis not present

## 2022-07-09 DIAGNOSIS — N2581 Secondary hyperparathyroidism of renal origin: Secondary | ICD-10-CM | POA: Diagnosis not present

## 2022-07-11 DIAGNOSIS — N186 End stage renal disease: Secondary | ICD-10-CM | POA: Diagnosis not present

## 2022-07-11 DIAGNOSIS — N2581 Secondary hyperparathyroidism of renal origin: Secondary | ICD-10-CM | POA: Diagnosis not present

## 2022-07-11 DIAGNOSIS — Z992 Dependence on renal dialysis: Secondary | ICD-10-CM | POA: Diagnosis not present

## 2022-07-13 DIAGNOSIS — Z992 Dependence on renal dialysis: Secondary | ICD-10-CM | POA: Diagnosis not present

## 2022-07-13 DIAGNOSIS — N2581 Secondary hyperparathyroidism of renal origin: Secondary | ICD-10-CM | POA: Diagnosis not present

## 2022-07-13 DIAGNOSIS — N186 End stage renal disease: Secondary | ICD-10-CM | POA: Diagnosis not present

## 2022-07-16 DIAGNOSIS — N2581 Secondary hyperparathyroidism of renal origin: Secondary | ICD-10-CM | POA: Diagnosis not present

## 2022-07-16 DIAGNOSIS — Z992 Dependence on renal dialysis: Secondary | ICD-10-CM | POA: Diagnosis not present

## 2022-07-16 DIAGNOSIS — N186 End stage renal disease: Secondary | ICD-10-CM | POA: Diagnosis not present

## 2022-07-18 DIAGNOSIS — N2581 Secondary hyperparathyroidism of renal origin: Secondary | ICD-10-CM | POA: Diagnosis not present

## 2022-07-18 DIAGNOSIS — Z992 Dependence on renal dialysis: Secondary | ICD-10-CM | POA: Diagnosis not present

## 2022-07-18 DIAGNOSIS — N186 End stage renal disease: Secondary | ICD-10-CM | POA: Diagnosis not present

## 2022-07-20 ENCOUNTER — Ambulatory Visit: Payer: Medicare HMO | Admitting: Cardiology

## 2022-07-20 DIAGNOSIS — Z992 Dependence on renal dialysis: Secondary | ICD-10-CM | POA: Diagnosis not present

## 2022-07-20 DIAGNOSIS — N186 End stage renal disease: Secondary | ICD-10-CM | POA: Diagnosis not present

## 2022-07-20 DIAGNOSIS — N2581 Secondary hyperparathyroidism of renal origin: Secondary | ICD-10-CM | POA: Diagnosis not present

## 2022-07-23 DIAGNOSIS — N186 End stage renal disease: Secondary | ICD-10-CM | POA: Diagnosis not present

## 2022-07-23 DIAGNOSIS — Z992 Dependence on renal dialysis: Secondary | ICD-10-CM | POA: Diagnosis not present

## 2022-07-23 DIAGNOSIS — N2581 Secondary hyperparathyroidism of renal origin: Secondary | ICD-10-CM | POA: Diagnosis not present

## 2022-07-25 DIAGNOSIS — N2581 Secondary hyperparathyroidism of renal origin: Secondary | ICD-10-CM | POA: Diagnosis not present

## 2022-07-25 DIAGNOSIS — N186 End stage renal disease: Secondary | ICD-10-CM | POA: Diagnosis not present

## 2022-07-25 DIAGNOSIS — Z992 Dependence on renal dialysis: Secondary | ICD-10-CM | POA: Diagnosis not present

## 2022-07-27 DIAGNOSIS — N186 End stage renal disease: Secondary | ICD-10-CM | POA: Diagnosis not present

## 2022-07-27 DIAGNOSIS — N2581 Secondary hyperparathyroidism of renal origin: Secondary | ICD-10-CM | POA: Diagnosis not present

## 2022-07-27 DIAGNOSIS — Z992 Dependence on renal dialysis: Secondary | ICD-10-CM | POA: Diagnosis not present

## 2022-07-28 DIAGNOSIS — Z992 Dependence on renal dialysis: Secondary | ICD-10-CM | POA: Diagnosis not present

## 2022-07-28 DIAGNOSIS — E1129 Type 2 diabetes mellitus with other diabetic kidney complication: Secondary | ICD-10-CM | POA: Diagnosis not present

## 2022-07-28 DIAGNOSIS — N186 End stage renal disease: Secondary | ICD-10-CM | POA: Diagnosis not present

## 2022-07-30 DIAGNOSIS — N186 End stage renal disease: Secondary | ICD-10-CM | POA: Diagnosis not present

## 2022-07-30 DIAGNOSIS — N2581 Secondary hyperparathyroidism of renal origin: Secondary | ICD-10-CM | POA: Diagnosis not present

## 2022-07-30 DIAGNOSIS — Z992 Dependence on renal dialysis: Secondary | ICD-10-CM | POA: Diagnosis not present

## 2022-08-01 DIAGNOSIS — N186 End stage renal disease: Secondary | ICD-10-CM | POA: Diagnosis not present

## 2022-08-01 DIAGNOSIS — N2581 Secondary hyperparathyroidism of renal origin: Secondary | ICD-10-CM | POA: Diagnosis not present

## 2022-08-01 DIAGNOSIS — Z992 Dependence on renal dialysis: Secondary | ICD-10-CM | POA: Diagnosis not present

## 2022-08-03 DIAGNOSIS — Z992 Dependence on renal dialysis: Secondary | ICD-10-CM | POA: Diagnosis not present

## 2022-08-03 DIAGNOSIS — N186 End stage renal disease: Secondary | ICD-10-CM | POA: Diagnosis not present

## 2022-08-03 DIAGNOSIS — N2581 Secondary hyperparathyroidism of renal origin: Secondary | ICD-10-CM | POA: Diagnosis not present

## 2022-08-06 DIAGNOSIS — Z992 Dependence on renal dialysis: Secondary | ICD-10-CM | POA: Diagnosis not present

## 2022-08-06 DIAGNOSIS — N186 End stage renal disease: Secondary | ICD-10-CM | POA: Diagnosis not present

## 2022-08-06 DIAGNOSIS — N2581 Secondary hyperparathyroidism of renal origin: Secondary | ICD-10-CM | POA: Diagnosis not present

## 2022-08-08 DIAGNOSIS — N2581 Secondary hyperparathyroidism of renal origin: Secondary | ICD-10-CM | POA: Diagnosis not present

## 2022-08-08 DIAGNOSIS — Z992 Dependence on renal dialysis: Secondary | ICD-10-CM | POA: Diagnosis not present

## 2022-08-08 DIAGNOSIS — N186 End stage renal disease: Secondary | ICD-10-CM | POA: Diagnosis not present

## 2022-08-10 DIAGNOSIS — N186 End stage renal disease: Secondary | ICD-10-CM | POA: Diagnosis not present

## 2022-08-10 DIAGNOSIS — N2581 Secondary hyperparathyroidism of renal origin: Secondary | ICD-10-CM | POA: Diagnosis not present

## 2022-08-10 DIAGNOSIS — Z992 Dependence on renal dialysis: Secondary | ICD-10-CM | POA: Diagnosis not present

## 2022-08-13 DIAGNOSIS — N2581 Secondary hyperparathyroidism of renal origin: Secondary | ICD-10-CM | POA: Diagnosis not present

## 2022-08-13 DIAGNOSIS — Z992 Dependence on renal dialysis: Secondary | ICD-10-CM | POA: Diagnosis not present

## 2022-08-13 DIAGNOSIS — N186 End stage renal disease: Secondary | ICD-10-CM | POA: Diagnosis not present

## 2022-08-15 DIAGNOSIS — Z992 Dependence on renal dialysis: Secondary | ICD-10-CM | POA: Diagnosis not present

## 2022-08-15 DIAGNOSIS — N186 End stage renal disease: Secondary | ICD-10-CM | POA: Diagnosis not present

## 2022-08-15 DIAGNOSIS — N2581 Secondary hyperparathyroidism of renal origin: Secondary | ICD-10-CM | POA: Diagnosis not present

## 2022-08-17 DIAGNOSIS — Z992 Dependence on renal dialysis: Secondary | ICD-10-CM | POA: Diagnosis not present

## 2022-08-17 DIAGNOSIS — N2581 Secondary hyperparathyroidism of renal origin: Secondary | ICD-10-CM | POA: Diagnosis not present

## 2022-08-17 DIAGNOSIS — N186 End stage renal disease: Secondary | ICD-10-CM | POA: Diagnosis not present

## 2022-08-20 DIAGNOSIS — Z992 Dependence on renal dialysis: Secondary | ICD-10-CM | POA: Diagnosis not present

## 2022-08-20 DIAGNOSIS — N186 End stage renal disease: Secondary | ICD-10-CM | POA: Diagnosis not present

## 2022-08-20 DIAGNOSIS — N2581 Secondary hyperparathyroidism of renal origin: Secondary | ICD-10-CM | POA: Diagnosis not present

## 2022-08-22 DIAGNOSIS — Z992 Dependence on renal dialysis: Secondary | ICD-10-CM | POA: Diagnosis not present

## 2022-08-22 DIAGNOSIS — N186 End stage renal disease: Secondary | ICD-10-CM | POA: Diagnosis not present

## 2022-08-22 DIAGNOSIS — N2581 Secondary hyperparathyroidism of renal origin: Secondary | ICD-10-CM | POA: Diagnosis not present

## 2022-08-23 ENCOUNTER — Ambulatory Visit: Payer: Medicare HMO | Admitting: Cardiology

## 2022-08-23 NOTE — Progress Notes (Signed)
Error

## 2022-08-24 DIAGNOSIS — Z992 Dependence on renal dialysis: Secondary | ICD-10-CM | POA: Diagnosis not present

## 2022-08-24 DIAGNOSIS — N186 End stage renal disease: Secondary | ICD-10-CM | POA: Diagnosis not present

## 2022-08-24 DIAGNOSIS — N2581 Secondary hyperparathyroidism of renal origin: Secondary | ICD-10-CM | POA: Diagnosis not present

## 2022-08-27 DIAGNOSIS — N186 End stage renal disease: Secondary | ICD-10-CM | POA: Diagnosis not present

## 2022-08-27 DIAGNOSIS — Z992 Dependence on renal dialysis: Secondary | ICD-10-CM | POA: Diagnosis not present

## 2022-08-27 DIAGNOSIS — N2581 Secondary hyperparathyroidism of renal origin: Secondary | ICD-10-CM | POA: Diagnosis not present

## 2022-08-28 DIAGNOSIS — N186 End stage renal disease: Secondary | ICD-10-CM | POA: Diagnosis not present

## 2022-08-28 DIAGNOSIS — E1129 Type 2 diabetes mellitus with other diabetic kidney complication: Secondary | ICD-10-CM | POA: Diagnosis not present

## 2022-08-28 DIAGNOSIS — Z992 Dependence on renal dialysis: Secondary | ICD-10-CM | POA: Diagnosis not present

## 2022-08-29 DIAGNOSIS — N2581 Secondary hyperparathyroidism of renal origin: Secondary | ICD-10-CM | POA: Diagnosis not present

## 2022-08-29 DIAGNOSIS — N186 End stage renal disease: Secondary | ICD-10-CM | POA: Diagnosis not present

## 2022-08-29 DIAGNOSIS — Z992 Dependence on renal dialysis: Secondary | ICD-10-CM | POA: Diagnosis not present

## 2022-08-31 DIAGNOSIS — N186 End stage renal disease: Secondary | ICD-10-CM | POA: Diagnosis not present

## 2022-08-31 DIAGNOSIS — Z992 Dependence on renal dialysis: Secondary | ICD-10-CM | POA: Diagnosis not present

## 2022-08-31 DIAGNOSIS — N2581 Secondary hyperparathyroidism of renal origin: Secondary | ICD-10-CM | POA: Diagnosis not present

## 2022-09-03 DIAGNOSIS — N186 End stage renal disease: Secondary | ICD-10-CM | POA: Diagnosis not present

## 2022-09-03 DIAGNOSIS — Z992 Dependence on renal dialysis: Secondary | ICD-10-CM | POA: Diagnosis not present

## 2022-09-03 DIAGNOSIS — N2581 Secondary hyperparathyroidism of renal origin: Secondary | ICD-10-CM | POA: Diagnosis not present

## 2022-09-05 DIAGNOSIS — Z992 Dependence on renal dialysis: Secondary | ICD-10-CM | POA: Diagnosis not present

## 2022-09-05 DIAGNOSIS — N186 End stage renal disease: Secondary | ICD-10-CM | POA: Diagnosis not present

## 2022-09-05 DIAGNOSIS — N2581 Secondary hyperparathyroidism of renal origin: Secondary | ICD-10-CM | POA: Diagnosis not present

## 2022-09-07 DIAGNOSIS — N186 End stage renal disease: Secondary | ICD-10-CM | POA: Diagnosis not present

## 2022-09-07 DIAGNOSIS — N2581 Secondary hyperparathyroidism of renal origin: Secondary | ICD-10-CM | POA: Diagnosis not present

## 2022-09-07 DIAGNOSIS — Z992 Dependence on renal dialysis: Secondary | ICD-10-CM | POA: Diagnosis not present

## 2022-09-10 DIAGNOSIS — Z992 Dependence on renal dialysis: Secondary | ICD-10-CM | POA: Diagnosis not present

## 2022-09-10 DIAGNOSIS — N186 End stage renal disease: Secondary | ICD-10-CM | POA: Diagnosis not present

## 2022-09-10 DIAGNOSIS — N2581 Secondary hyperparathyroidism of renal origin: Secondary | ICD-10-CM | POA: Diagnosis not present

## 2022-09-11 DIAGNOSIS — T82858A Stenosis of vascular prosthetic devices, implants and grafts, initial encounter: Secondary | ICD-10-CM | POA: Diagnosis not present

## 2022-09-11 DIAGNOSIS — I871 Compression of vein: Secondary | ICD-10-CM | POA: Diagnosis not present

## 2022-09-11 DIAGNOSIS — Z992 Dependence on renal dialysis: Secondary | ICD-10-CM | POA: Diagnosis not present

## 2022-09-11 DIAGNOSIS — N186 End stage renal disease: Secondary | ICD-10-CM | POA: Diagnosis not present

## 2022-09-12 DIAGNOSIS — Z992 Dependence on renal dialysis: Secondary | ICD-10-CM | POA: Diagnosis not present

## 2022-09-12 DIAGNOSIS — N186 End stage renal disease: Secondary | ICD-10-CM | POA: Diagnosis not present

## 2022-09-12 DIAGNOSIS — N2581 Secondary hyperparathyroidism of renal origin: Secondary | ICD-10-CM | POA: Diagnosis not present

## 2022-09-14 DIAGNOSIS — N2581 Secondary hyperparathyroidism of renal origin: Secondary | ICD-10-CM | POA: Diagnosis not present

## 2022-09-14 DIAGNOSIS — N186 End stage renal disease: Secondary | ICD-10-CM | POA: Diagnosis not present

## 2022-09-14 DIAGNOSIS — Z992 Dependence on renal dialysis: Secondary | ICD-10-CM | POA: Diagnosis not present

## 2022-09-16 DIAGNOSIS — N186 End stage renal disease: Secondary | ICD-10-CM | POA: Diagnosis not present

## 2022-09-16 DIAGNOSIS — N2581 Secondary hyperparathyroidism of renal origin: Secondary | ICD-10-CM | POA: Diagnosis not present

## 2022-09-16 DIAGNOSIS — Z992 Dependence on renal dialysis: Secondary | ICD-10-CM | POA: Diagnosis not present

## 2022-09-18 DIAGNOSIS — N2581 Secondary hyperparathyroidism of renal origin: Secondary | ICD-10-CM | POA: Diagnosis not present

## 2022-09-18 DIAGNOSIS — Z992 Dependence on renal dialysis: Secondary | ICD-10-CM | POA: Diagnosis not present

## 2022-09-18 DIAGNOSIS — N186 End stage renal disease: Secondary | ICD-10-CM | POA: Diagnosis not present

## 2022-09-21 DIAGNOSIS — N2581 Secondary hyperparathyroidism of renal origin: Secondary | ICD-10-CM | POA: Diagnosis not present

## 2022-09-21 DIAGNOSIS — N186 End stage renal disease: Secondary | ICD-10-CM | POA: Diagnosis not present

## 2022-09-21 DIAGNOSIS — Z992 Dependence on renal dialysis: Secondary | ICD-10-CM | POA: Diagnosis not present

## 2022-09-24 DIAGNOSIS — Z992 Dependence on renal dialysis: Secondary | ICD-10-CM | POA: Diagnosis not present

## 2022-09-24 DIAGNOSIS — N2581 Secondary hyperparathyroidism of renal origin: Secondary | ICD-10-CM | POA: Diagnosis not present

## 2022-09-24 DIAGNOSIS — N186 End stage renal disease: Secondary | ICD-10-CM | POA: Diagnosis not present

## 2022-09-26 DIAGNOSIS — N2581 Secondary hyperparathyroidism of renal origin: Secondary | ICD-10-CM | POA: Diagnosis not present

## 2022-09-26 DIAGNOSIS — Z992 Dependence on renal dialysis: Secondary | ICD-10-CM | POA: Diagnosis not present

## 2022-09-26 DIAGNOSIS — N186 End stage renal disease: Secondary | ICD-10-CM | POA: Diagnosis not present

## 2022-09-27 ENCOUNTER — Encounter: Payer: Self-pay | Admitting: Cardiology

## 2022-09-27 ENCOUNTER — Ambulatory Visit: Payer: Medicare HMO | Admitting: Cardiology

## 2022-09-27 VITALS — BP 174/75 | HR 64 | Resp 16 | Ht 75.0 in | Wt 191.0 lb

## 2022-09-27 DIAGNOSIS — I10 Essential (primary) hypertension: Secondary | ICD-10-CM

## 2022-09-27 DIAGNOSIS — Z992 Dependence on renal dialysis: Secondary | ICD-10-CM | POA: Diagnosis not present

## 2022-09-27 DIAGNOSIS — I739 Peripheral vascular disease, unspecified: Secondary | ICD-10-CM

## 2022-09-27 DIAGNOSIS — I480 Paroxysmal atrial fibrillation: Secondary | ICD-10-CM | POA: Diagnosis not present

## 2022-09-27 DIAGNOSIS — E1129 Type 2 diabetes mellitus with other diabetic kidney complication: Secondary | ICD-10-CM | POA: Diagnosis not present

## 2022-09-27 DIAGNOSIS — N186 End stage renal disease: Secondary | ICD-10-CM | POA: Diagnosis not present

## 2022-09-27 MED ORDER — AMLODIPINE BESYLATE 5 MG PO TABS: 5.0000 mg | ORAL_TABLET | Freq: Every day | ORAL | 3 refills | Status: DC

## 2022-09-27 MED ORDER — LOSARTAN POTASSIUM 50 MG PO TABS: 50.0000 mg | ORAL_TABLET | Freq: Every day | ORAL | 2 refills | Status: DC

## 2022-09-27 NOTE — Progress Notes (Signed)
Patient is here for follow up visit.  Subjective:   William Webb, male    DOB: 01-31-1951, 71 y.o.   MRN: [id number]   Chief Complaint  Patient presents with   PAD   Follow-up    3 month   HPI  71 y.o. 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 He is feeling great and has had regular follow up with podiatry. Blood pressure elevated today. No other complaints.    Current Outpatient Medications:    acetaminophen (TYLENOL) 650 MG CR tablet, Take 1,300 mg by mouth at bedtime., Disp: , Rfl:    Alcohol Swabs (B-D SINGLE USE SWABS REGULAR) PADS, , Disp: , Rfl:    apixaban (ELIQUIS) 5 MG TABS tablet, Take 1 tablet (5 mg total) by mouth 2 (two) times daily., Disp: 180 tablet, Rfl: 3   B Complex-C-Zn-Folic Acid (DIALYVITE 628 WITH ZINC) 0.8 MG TABS, SMARTSIG:1 Tablet(s) By Mouth Every Evening, Disp: , Rfl:    Blood Glucose Monitoring Suppl (ACCU-CHEK GUIDE ME) w/Device KIT, 1 Device by Does not apply route 4 (four) times daily -  before meals and at bedtime., Disp: 1 kit, Rfl: 0   Calcium Carbonate-Simethicone (ALKA-SELTZER HEARTBURN + GAS) 750-80 MG CHEW, Chew 1 tablet by mouth 3 (three) times daily as needed (indigestion/heartburn.)., Disp: , Rfl:    carvedilol (COREG) 25 MG tablet, Take 25 mg by mouth 2 (two) times daily., Disp: , Rfl:    clopidogrel (PLAVIX) 75 MG tablet, Take 1 tablet (75 mg total) by mouth daily., Disp: 90 tablet, Rfl: 3   Continuous Blood Gluc Sensor (DEXCOM G6 SENSOR) MISC, 1 Device by Does not apply route See admin instructions. Change every 10 days, Disp: 9 each, Rfl: 3   Continuous Blood Gluc Sensor (DEXCOM G6 SENSOR) MISC, 1 Device by Does not apply route See admin  instructions. Change every 10 days, Disp: 9 each, Rfl: 3   Cyanocobalamin (VITAMIN B-12) 5000 MCG TBDP, Take 5,000 mcg by mouth in the morning., Disp: , Rfl:    Dextromethorphan-guaiFENesin (CORICIDIN HBP CONGESTION/COUGH) 10-200 MG CAPS, Take 2 capsules by mouth daily as needed (cough/congestion)., Disp: , Rfl:    furosemide (LASIX) 80 MG tablet, Take 80 mg by mouth See admin instructions. Take 80 mg by mouth twice a day and additional 80 mg before bedtime as needed for swelling of the ankles, Disp: , Rfl:    gabapentin (NEURONTIN) 600 MG tablet, Take 0.5 tablets (300 mg total) by mouth 2 (two) times daily., Disp: 60 tablet, Rfl: 0   GEBAUERS PAIN EASE AERO, Apply topically., Disp: , Rfl:    glucose 4 GM chewable tablet, Chew 1 tablet by mouth as needed for low blood sugar., Disp: , Rfl:    hydrALAZINE (APRESOLINE) 100 MG tablet, Take 1 tablet (100 mg total) by mouth 3 (three) times daily., Disp: 90 tablet, Rfl: 0   isosorbide mononitrate (IMDUR) 60 MG 24 hr tablet, Take 1 tablet (60 mg total) by mouth daily. (Patient taking differently: Take 60 mg by mouth at bedtime.), Disp: 30 tablet, Rfl: 0   ketorolac (ACULAR) 0.5 % ophthalmic solution, Place 1 drop into both eyes daily as needed (inflamation)., Disp: , Rfl:    Lidocaine 4 % AERO, Apply 1  spray topically as needed (prior to port being accessed.)., Disp: , Rfl:    lidocaine-prilocaine (EMLA) cream, 1 application as needed (prior to port being accessed)., Disp: , Rfl:    loratadine (CLARITIN) 10 MG tablet, Take 10 mg by mouth daily as needed for allergies., Disp: , Rfl:    NOVOLOG FLEXPEN 100 UNIT/ML FlexPen, INJECT 3 UNITS INTO THE SKIN 3 (THREE) TIMES DAILY WITH MEALS., Disp: 15 mL, Rfl: 11   ondansetron (ZOFRAN) 4 MG tablet, Take 1 tablet (4 mg total) by mouth every 8 (eight) hours as needed for nausea., Disp: 20 tablet, Rfl: 0   pantoprazole (PROTONIX) 40 MG tablet, Take 1 tablet (40 mg total) by mouth daily., Disp: 30 tablet, Rfl: 0    rosuvastatin (CRESTOR) 10 MG tablet, Take 10 mg by mouth daily., Disp: , Rfl:    sevelamer carbonate (RENVELA) 800 MG tablet, Take 800 mg by mouth with breakfast, with lunch, and with evening meal., Disp: , Rfl:    tamsulosin (FLOMAX) 0.4 MG CAPS capsule, Take 0.4 mg by mouth at bedtime., Disp: , Rfl:    traMADol (ULTRAM) 50 MG tablet, Take 1 tablet (50 mg total) by mouth every 12 (twelve) hours as needed for up to 8 doses., Disp: 8 tablet, Rfl: 0   VELPHORO 500 MG chewable tablet, Chew 500 mg by mouth 3 (three) times daily., Disp: , Rfl:    vitamin E 1000 UNIT capsule, Take 1,000 Units by mouth daily., Disp: , Rfl:    atorvastatin (LIPITOR) 80 MG tablet, Take 1 tablet (80 mg total) by mouth daily., Disp: 30 tablet, Rfl: 1  Cardiovascular studies:  EKG 09/27/2022: Sinus rhythm 64 bpm  LVH Nonspecific T-abnormality  LEA duplex 03/17/2022: Right: 50-74% stenosis noted in the superficial femoral artery. 30-49%  stenosis noted in the peroneal artery. Diffuse calcific plaque noted  throughout with turbulent flow. 50-74% mid SFA stenosis, and 30-49% distal  peroneal stenosis. Collateral flow  noted at proximal ATA. Multiple vessels not well visualized secondary to  patient's constant leg motion and squeezing/trapping of technologist's  hand.   Left: Diffuse calcific plaque noted throughout with turbulent flow. 30-49%  proximal SFA stenosis, 50-74% mid SFA stenosis, and 30-49% stenosis noted  distal ATA and mid PTA. Multiple vessels not well visualized secondary to  patient's constant leg motion  and squeezing/trapping of technologist's hand.    PV intervention 03/14/2022: Rt TP trunk 80% stenosis Rt mid common peroneal artery 95% stenosis Prox occlusion of Rt ATA, Rt PT   Successful orbital atherectomy and PTA 3.0X60 mm balloon to Rt TP trunk and Rt mid common peroneal artery with 0% residual stenosis   Expect improvement in foot circulation and healing post amputation at the level of  toe or transmetatarsal amputation. If difficulty healing noted, could then consider revascularization to Rt ATA with retrograde access to Rt DP.   Echocardiogram 03/15/2022: 1. Left ventricular ejection fraction, by estimation, is 55 to 60%. The  left ventricle has normal function. The left ventricle has no regional  wall motion abnormalities. There is moderate left ventricular hypertrophy.  Left ventricular diastolic  parameters are consistent with Grade II diastolic dysfunction  (pseudonormalization). Elevated left atrial pressure.   2. Right ventricular systolic function is normal. The right ventricular  size is normal.   3. Left atrial size was severely dilated.   4. Right atrial size was moderately dilated.   5. A small pericardial effusion is present. The pericardial effusion is  posterior to the left ventricle.  6. The mitral valve is normal in structure. Mild mitral valve  regurgitation. No evidence of mitral stenosis.   7. The aortic valve is calcified. Aortic valve regurgitation is mild.  Aortic valve area, by VTI measures 1.21 cm. Aortic valve mean gradient  measures 17.0 mmHg. Aortic valve Vmax measures 2.62 m/s.   8. The inferior vena cava is normal in size with greater than 50%  respiratory variability, suggesting right atrial pressure of 3 mmHg.   Comparison(s): A prior study was performed on 09/15/2020. No significant  change from prior study.   Coronary angiogram 10/20/2018: LM: Normal LAD: Distal 70% diffuse disease.        Ostial 90% stenosis in Diag1. Patent prox Diag 1 stent LCx: Large OM1 with patent prior stent RCA: 100% occluded RPDA        Partially successful PTCA attempt        100%--99% stenosis        TIMI flow 0-I  LVEDP 16 mmHg  Echocardiogram 10/17/2018: Left ventricle: The cavity size was normal. Wall thickness was increased in a pattern of severe LVH. Systolic function was normal. The estimated ejection fraction was in the range of 50% to  55%. Wall motion was normal; there were no regional wall motion abnormalities. Doppler parameters are consistent with anormal left ventricular relaxation (grade 1 diastolic dysfunction). Doppler parameters are consistent with high ventricular filling pressure. - Left atrium: The atrium was moderately dilated. - Right atrium: The atrium was mildly dilated.   Impressions: - Low normal to mildly reduced LV systolic function; EF 50; severe LVH; mild diastolic dysfunction; biatrial enlargement.  Recent Labs: 03/19/2022: Glucose 141, BUN/Cr 33/7.22. EGFR 7. Na/K 133/4.0. Albumin 2.5. Phos 4.9. Rest of the CMP normal H/H 9.7/29.6. MCV 91. Platelets 159  01/19/2021: Hb 11.1 HbA1C 6.3% TSH 2.2  Review of Systems  Cardiovascular:  Negative for chest pain, dyspnea on exertion, leg swelling, palpitations and syncope.        Objective:    Vitals:   09/27/22 1006 09/27/22 1014  BP: (!) 169/73 (!) 174/75  Pulse: 64 64  Resp: 16   SpO2: 99%    Physical Exam Vitals and nursing note reviewed.  Constitutional:      General: He is not in acute distress. Neck:     Vascular: No JVD.  Cardiovascular:     Rate and Rhythm: Normal rate and regular rhythm.     Pulses:          Dorsalis pedis pulses are 0 on the right side and 0 on the left side.       Posterior tibial pulses are 1+ on the right side and 0 on the left side.     Heart sounds: Normal heart sounds. No murmur heard.    Comments: 2nd toe apmputation. Healing well Pulmonary:     Effort: Pulmonary effort is normal.     Breath sounds: Normal breath sounds. No wheezing or rales.  Musculoskeletal:     Right lower leg: No edema.     Left lower leg: No edema.        Assessment & Recommendations:   71 y.o. 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  Added losartan 50 mg daily. Arranged for remote patient monitoring through pur pharmacist Haynes Dage.  PAD: S/p revascularization to TP trunk, peroneal  revascularization Continue plavix 75 mg daily  Atrial fibrillation: H/o persistent, now in sinus rhythm s/p cardioversion 09/29/2019. CHA2DS2VASc score 4, annual stroke risk 5%. Continue eliquis 5 mg bid.   CAD: CAD s/p prior PCI, NSTEMI in 09/2018, attempted but unsuccessful. PTCA to chronically occluded Rt PDA Stable without angina symptoms.   F/u in 3 months   Nigel Mormon, MD Pager: [phone] Office: [phone]

## 2022-09-28 DIAGNOSIS — Z992 Dependence on renal dialysis: Secondary | ICD-10-CM | POA: Diagnosis not present

## 2022-09-28 DIAGNOSIS — E1129 Type 2 diabetes mellitus with other diabetic kidney complication: Secondary | ICD-10-CM | POA: Diagnosis not present

## 2022-09-28 DIAGNOSIS — D631 Anemia in chronic kidney disease: Secondary | ICD-10-CM | POA: Diagnosis not present

## 2022-09-28 DIAGNOSIS — R52 Pain, unspecified: Secondary | ICD-10-CM | POA: Diagnosis not present

## 2022-09-28 DIAGNOSIS — D509 Iron deficiency anemia, unspecified: Secondary | ICD-10-CM | POA: Diagnosis not present

## 2022-09-28 DIAGNOSIS — D689 Coagulation defect, unspecified: Secondary | ICD-10-CM | POA: Diagnosis not present

## 2022-09-28 DIAGNOSIS — N2581 Secondary hyperparathyroidism of renal origin: Secondary | ICD-10-CM | POA: Diagnosis not present

## 2022-09-28 DIAGNOSIS — N186 End stage renal disease: Secondary | ICD-10-CM | POA: Diagnosis not present

## 2022-10-01 DIAGNOSIS — N186 End stage renal disease: Secondary | ICD-10-CM | POA: Diagnosis not present

## 2022-10-01 DIAGNOSIS — D631 Anemia in chronic kidney disease: Secondary | ICD-10-CM | POA: Diagnosis not present

## 2022-10-01 DIAGNOSIS — E1129 Type 2 diabetes mellitus with other diabetic kidney complication: Secondary | ICD-10-CM | POA: Diagnosis not present

## 2022-10-01 DIAGNOSIS — I4819 Other persistent atrial fibrillation: Secondary | ICD-10-CM | POA: Diagnosis not present

## 2022-10-01 DIAGNOSIS — D689 Coagulation defect, unspecified: Secondary | ICD-10-CM | POA: Diagnosis not present

## 2022-10-01 DIAGNOSIS — R52 Pain, unspecified: Secondary | ICD-10-CM | POA: Diagnosis not present

## 2022-10-01 DIAGNOSIS — N2581 Secondary hyperparathyroidism of renal origin: Secondary | ICD-10-CM | POA: Diagnosis not present

## 2022-10-01 DIAGNOSIS — Z992 Dependence on renal dialysis: Secondary | ICD-10-CM | POA: Diagnosis not present

## 2022-10-01 DIAGNOSIS — D509 Iron deficiency anemia, unspecified: Secondary | ICD-10-CM | POA: Diagnosis not present

## 2022-10-03 DIAGNOSIS — E1159 Type 2 diabetes mellitus with other circulatory complications: Secondary | ICD-10-CM | POA: Diagnosis not present

## 2022-10-03 DIAGNOSIS — D631 Anemia in chronic kidney disease: Secondary | ICD-10-CM | POA: Diagnosis not present

## 2022-10-03 DIAGNOSIS — D509 Iron deficiency anemia, unspecified: Secondary | ICD-10-CM | POA: Diagnosis not present

## 2022-10-03 DIAGNOSIS — E1129 Type 2 diabetes mellitus with other diabetic kidney complication: Secondary | ICD-10-CM | POA: Diagnosis not present

## 2022-10-03 DIAGNOSIS — D689 Coagulation defect, unspecified: Secondary | ICD-10-CM | POA: Diagnosis not present

## 2022-10-03 DIAGNOSIS — N186 End stage renal disease: Secondary | ICD-10-CM | POA: Diagnosis not present

## 2022-10-03 DIAGNOSIS — N2581 Secondary hyperparathyroidism of renal origin: Secondary | ICD-10-CM | POA: Diagnosis not present

## 2022-10-03 DIAGNOSIS — R52 Pain, unspecified: Secondary | ICD-10-CM | POA: Diagnosis not present

## 2022-10-03 DIAGNOSIS — Z992 Dependence on renal dialysis: Secondary | ICD-10-CM | POA: Diagnosis not present

## 2022-10-05 DIAGNOSIS — E1129 Type 2 diabetes mellitus with other diabetic kidney complication: Secondary | ICD-10-CM | POA: Diagnosis not present

## 2022-10-05 DIAGNOSIS — D689 Coagulation defect, unspecified: Secondary | ICD-10-CM | POA: Diagnosis not present

## 2022-10-05 DIAGNOSIS — N186 End stage renal disease: Secondary | ICD-10-CM | POA: Diagnosis not present

## 2022-10-05 DIAGNOSIS — N2581 Secondary hyperparathyroidism of renal origin: Secondary | ICD-10-CM | POA: Diagnosis not present

## 2022-10-05 DIAGNOSIS — Z992 Dependence on renal dialysis: Secondary | ICD-10-CM | POA: Diagnosis not present

## 2022-10-05 DIAGNOSIS — D509 Iron deficiency anemia, unspecified: Secondary | ICD-10-CM | POA: Diagnosis not present

## 2022-10-05 DIAGNOSIS — D631 Anemia in chronic kidney disease: Secondary | ICD-10-CM | POA: Diagnosis not present

## 2022-10-05 DIAGNOSIS — R52 Pain, unspecified: Secondary | ICD-10-CM | POA: Diagnosis not present

## 2022-10-08 DIAGNOSIS — Z992 Dependence on renal dialysis: Secondary | ICD-10-CM | POA: Diagnosis not present

## 2022-10-08 DIAGNOSIS — N2581 Secondary hyperparathyroidism of renal origin: Secondary | ICD-10-CM | POA: Diagnosis not present

## 2022-10-08 DIAGNOSIS — D689 Coagulation defect, unspecified: Secondary | ICD-10-CM | POA: Diagnosis not present

## 2022-10-08 DIAGNOSIS — D509 Iron deficiency anemia, unspecified: Secondary | ICD-10-CM | POA: Diagnosis not present

## 2022-10-08 DIAGNOSIS — D631 Anemia in chronic kidney disease: Secondary | ICD-10-CM | POA: Diagnosis not present

## 2022-10-08 DIAGNOSIS — R52 Pain, unspecified: Secondary | ICD-10-CM | POA: Diagnosis not present

## 2022-10-08 DIAGNOSIS — E1129 Type 2 diabetes mellitus with other diabetic kidney complication: Secondary | ICD-10-CM | POA: Diagnosis not present

## 2022-10-08 DIAGNOSIS — N186 End stage renal disease: Secondary | ICD-10-CM | POA: Diagnosis not present

## 2022-10-10 DIAGNOSIS — E1129 Type 2 diabetes mellitus with other diabetic kidney complication: Secondary | ICD-10-CM | POA: Diagnosis not present

## 2022-10-10 DIAGNOSIS — Z992 Dependence on renal dialysis: Secondary | ICD-10-CM | POA: Diagnosis not present

## 2022-10-10 DIAGNOSIS — N186 End stage renal disease: Secondary | ICD-10-CM | POA: Diagnosis not present

## 2022-10-10 DIAGNOSIS — R52 Pain, unspecified: Secondary | ICD-10-CM | POA: Diagnosis not present

## 2022-10-10 DIAGNOSIS — N2581 Secondary hyperparathyroidism of renal origin: Secondary | ICD-10-CM | POA: Diagnosis not present

## 2022-10-10 DIAGNOSIS — D689 Coagulation defect, unspecified: Secondary | ICD-10-CM | POA: Diagnosis not present

## 2022-10-10 DIAGNOSIS — D509 Iron deficiency anemia, unspecified: Secondary | ICD-10-CM | POA: Diagnosis not present

## 2022-10-10 DIAGNOSIS — D631 Anemia in chronic kidney disease: Secondary | ICD-10-CM | POA: Diagnosis not present

## 2022-10-12 DIAGNOSIS — R52 Pain, unspecified: Secondary | ICD-10-CM | POA: Diagnosis not present

## 2022-10-12 DIAGNOSIS — D509 Iron deficiency anemia, unspecified: Secondary | ICD-10-CM | POA: Diagnosis not present

## 2022-10-12 DIAGNOSIS — N2581 Secondary hyperparathyroidism of renal origin: Secondary | ICD-10-CM | POA: Diagnosis not present

## 2022-10-12 DIAGNOSIS — N186 End stage renal disease: Secondary | ICD-10-CM | POA: Diagnosis not present

## 2022-10-12 DIAGNOSIS — D631 Anemia in chronic kidney disease: Secondary | ICD-10-CM | POA: Diagnosis not present

## 2022-10-12 DIAGNOSIS — E1129 Type 2 diabetes mellitus with other diabetic kidney complication: Secondary | ICD-10-CM | POA: Diagnosis not present

## 2022-10-12 DIAGNOSIS — D689 Coagulation defect, unspecified: Secondary | ICD-10-CM | POA: Diagnosis not present

## 2022-10-12 DIAGNOSIS — Z992 Dependence on renal dialysis: Secondary | ICD-10-CM | POA: Diagnosis not present

## 2022-10-15 DIAGNOSIS — R52 Pain, unspecified: Secondary | ICD-10-CM | POA: Diagnosis not present

## 2022-10-15 DIAGNOSIS — N186 End stage renal disease: Secondary | ICD-10-CM | POA: Diagnosis not present

## 2022-10-15 DIAGNOSIS — J9690 Respiratory failure, unspecified, unspecified whether with hypoxia or hypercapnia: Secondary | ICD-10-CM | POA: Diagnosis not present

## 2022-10-15 DIAGNOSIS — D631 Anemia in chronic kidney disease: Secondary | ICD-10-CM | POA: Diagnosis not present

## 2022-10-15 DIAGNOSIS — N2581 Secondary hyperparathyroidism of renal origin: Secondary | ICD-10-CM | POA: Diagnosis not present

## 2022-10-15 DIAGNOSIS — Z992 Dependence on renal dialysis: Secondary | ICD-10-CM | POA: Diagnosis not present

## 2022-10-15 DIAGNOSIS — D689 Coagulation defect, unspecified: Secondary | ICD-10-CM | POA: Diagnosis not present

## 2022-10-15 DIAGNOSIS — E1129 Type 2 diabetes mellitus with other diabetic kidney complication: Secondary | ICD-10-CM | POA: Diagnosis not present

## 2022-10-15 DIAGNOSIS — D509 Iron deficiency anemia, unspecified: Secondary | ICD-10-CM | POA: Diagnosis not present

## 2022-10-17 DIAGNOSIS — R52 Pain, unspecified: Secondary | ICD-10-CM | POA: Diagnosis not present

## 2022-10-17 DIAGNOSIS — Z992 Dependence on renal dialysis: Secondary | ICD-10-CM | POA: Diagnosis not present

## 2022-10-17 DIAGNOSIS — N186 End stage renal disease: Secondary | ICD-10-CM | POA: Diagnosis not present

## 2022-10-17 DIAGNOSIS — E1129 Type 2 diabetes mellitus with other diabetic kidney complication: Secondary | ICD-10-CM | POA: Diagnosis not present

## 2022-10-17 DIAGNOSIS — D509 Iron deficiency anemia, unspecified: Secondary | ICD-10-CM | POA: Diagnosis not present

## 2022-10-17 DIAGNOSIS — N2581 Secondary hyperparathyroidism of renal origin: Secondary | ICD-10-CM | POA: Diagnosis not present

## 2022-10-17 DIAGNOSIS — D689 Coagulation defect, unspecified: Secondary | ICD-10-CM | POA: Diagnosis not present

## 2022-10-17 DIAGNOSIS — D631 Anemia in chronic kidney disease: Secondary | ICD-10-CM | POA: Diagnosis not present

## 2022-10-19 ENCOUNTER — Telehealth: Payer: Self-pay

## 2022-10-19 DIAGNOSIS — D689 Coagulation defect, unspecified: Secondary | ICD-10-CM | POA: Diagnosis not present

## 2022-10-19 DIAGNOSIS — N186 End stage renal disease: Secondary | ICD-10-CM | POA: Diagnosis not present

## 2022-10-19 DIAGNOSIS — E1129 Type 2 diabetes mellitus with other diabetic kidney complication: Secondary | ICD-10-CM | POA: Diagnosis not present

## 2022-10-19 DIAGNOSIS — D509 Iron deficiency anemia, unspecified: Secondary | ICD-10-CM | POA: Diagnosis not present

## 2022-10-19 DIAGNOSIS — R52 Pain, unspecified: Secondary | ICD-10-CM | POA: Diagnosis not present

## 2022-10-19 DIAGNOSIS — N2581 Secondary hyperparathyroidism of renal origin: Secondary | ICD-10-CM | POA: Diagnosis not present

## 2022-10-19 DIAGNOSIS — D631 Anemia in chronic kidney disease: Secondary | ICD-10-CM | POA: Diagnosis not present

## 2022-10-19 DIAGNOSIS — Z992 Dependence on renal dialysis: Secondary | ICD-10-CM | POA: Diagnosis not present

## 2022-10-19 NOTE — Telephone Encounter (Signed)
Initial readings after patient has started home blood pressure monitoring with RPM. Patient has started to take Losartan '50mg'$  daily. BP has improved some with the losartan addition.  Average Systolic BP Level 615.37 mmHg Lowest Systolic BP Level 943 mmHg Highest Systolic BP Level 276 mmHg  10/19/2022 Friday at 08:54 AM 161 / 84      10/18/2022 Thursday at 08:26 PM 151 / 78      10/18/2022 Thursday at 08:24 PM 154 / 84      10/17/2022 Wednesday at 08:12 PM 135 / 67      10/15/2022 Monday at 08:36 AM 158 / 85      10/11/2022 Thursday at 04:46 PM 126 / 76      10/10/2022 Wednesday at 06:03 PM 128 / 66      10/05/2022 Friday at 04:04 PM 152 / 75      10/03/2022 Wednesday at 07:48 AM 174 / 86      10/03/2022 Wednesday at 07:46 AM 170 / 82      10/03/2022 Wednesday at 07:43 AM 165 / 79      10/01/2022 Monday at 09:31 AM 152 / 80      10/01/2022 Monday at 09:29 AM 181 / 92

## 2022-10-21 DIAGNOSIS — D689 Coagulation defect, unspecified: Secondary | ICD-10-CM | POA: Diagnosis not present

## 2022-10-21 DIAGNOSIS — Z992 Dependence on renal dialysis: Secondary | ICD-10-CM | POA: Diagnosis not present

## 2022-10-21 DIAGNOSIS — R52 Pain, unspecified: Secondary | ICD-10-CM | POA: Diagnosis not present

## 2022-10-21 DIAGNOSIS — N186 End stage renal disease: Secondary | ICD-10-CM | POA: Diagnosis not present

## 2022-10-21 DIAGNOSIS — E1129 Type 2 diabetes mellitus with other diabetic kidney complication: Secondary | ICD-10-CM | POA: Diagnosis not present

## 2022-10-21 DIAGNOSIS — D631 Anemia in chronic kidney disease: Secondary | ICD-10-CM | POA: Diagnosis not present

## 2022-10-21 DIAGNOSIS — N2581 Secondary hyperparathyroidism of renal origin: Secondary | ICD-10-CM | POA: Diagnosis not present

## 2022-10-21 DIAGNOSIS — D509 Iron deficiency anemia, unspecified: Secondary | ICD-10-CM | POA: Diagnosis not present

## 2022-10-24 DIAGNOSIS — Z992 Dependence on renal dialysis: Secondary | ICD-10-CM | POA: Diagnosis not present

## 2022-10-24 DIAGNOSIS — E1129 Type 2 diabetes mellitus with other diabetic kidney complication: Secondary | ICD-10-CM | POA: Diagnosis not present

## 2022-10-24 DIAGNOSIS — D631 Anemia in chronic kidney disease: Secondary | ICD-10-CM | POA: Diagnosis not present

## 2022-10-24 DIAGNOSIS — D509 Iron deficiency anemia, unspecified: Secondary | ICD-10-CM | POA: Diagnosis not present

## 2022-10-24 DIAGNOSIS — N2581 Secondary hyperparathyroidism of renal origin: Secondary | ICD-10-CM | POA: Diagnosis not present

## 2022-10-24 DIAGNOSIS — N186 End stage renal disease: Secondary | ICD-10-CM | POA: Diagnosis not present

## 2022-10-24 DIAGNOSIS — D689 Coagulation defect, unspecified: Secondary | ICD-10-CM | POA: Diagnosis not present

## 2022-10-24 DIAGNOSIS — R52 Pain, unspecified: Secondary | ICD-10-CM | POA: Diagnosis not present

## 2022-10-26 DIAGNOSIS — Z992 Dependence on renal dialysis: Secondary | ICD-10-CM | POA: Diagnosis not present

## 2022-10-26 DIAGNOSIS — N186 End stage renal disease: Secondary | ICD-10-CM | POA: Diagnosis not present

## 2022-10-26 DIAGNOSIS — D689 Coagulation defect, unspecified: Secondary | ICD-10-CM | POA: Diagnosis not present

## 2022-10-26 DIAGNOSIS — E1129 Type 2 diabetes mellitus with other diabetic kidney complication: Secondary | ICD-10-CM | POA: Diagnosis not present

## 2022-10-26 DIAGNOSIS — D631 Anemia in chronic kidney disease: Secondary | ICD-10-CM | POA: Diagnosis not present

## 2022-10-26 DIAGNOSIS — N2581 Secondary hyperparathyroidism of renal origin: Secondary | ICD-10-CM | POA: Diagnosis not present

## 2022-10-26 DIAGNOSIS — D509 Iron deficiency anemia, unspecified: Secondary | ICD-10-CM | POA: Diagnosis not present

## 2022-10-26 DIAGNOSIS — R52 Pain, unspecified: Secondary | ICD-10-CM | POA: Diagnosis not present

## 2022-10-28 DIAGNOSIS — D689 Coagulation defect, unspecified: Secondary | ICD-10-CM | POA: Diagnosis not present

## 2022-10-28 DIAGNOSIS — Z992 Dependence on renal dialysis: Secondary | ICD-10-CM | POA: Diagnosis not present

## 2022-10-28 DIAGNOSIS — D509 Iron deficiency anemia, unspecified: Secondary | ICD-10-CM | POA: Diagnosis not present

## 2022-10-28 DIAGNOSIS — N2581 Secondary hyperparathyroidism of renal origin: Secondary | ICD-10-CM | POA: Diagnosis not present

## 2022-10-28 DIAGNOSIS — N186 End stage renal disease: Secondary | ICD-10-CM | POA: Diagnosis not present

## 2022-10-28 DIAGNOSIS — R52 Pain, unspecified: Secondary | ICD-10-CM | POA: Diagnosis not present

## 2022-10-28 DIAGNOSIS — D631 Anemia in chronic kidney disease: Secondary | ICD-10-CM | POA: Diagnosis not present

## 2022-10-28 DIAGNOSIS — E1129 Type 2 diabetes mellitus with other diabetic kidney complication: Secondary | ICD-10-CM | POA: Diagnosis not present

## 2022-10-31 DIAGNOSIS — N2581 Secondary hyperparathyroidism of renal origin: Secondary | ICD-10-CM | POA: Diagnosis not present

## 2022-10-31 DIAGNOSIS — E1129 Type 2 diabetes mellitus with other diabetic kidney complication: Secondary | ICD-10-CM | POA: Diagnosis not present

## 2022-10-31 DIAGNOSIS — D509 Iron deficiency anemia, unspecified: Secondary | ICD-10-CM | POA: Diagnosis not present

## 2022-10-31 DIAGNOSIS — D689 Coagulation defect, unspecified: Secondary | ICD-10-CM | POA: Diagnosis not present

## 2022-10-31 DIAGNOSIS — D631 Anemia in chronic kidney disease: Secondary | ICD-10-CM | POA: Diagnosis not present

## 2022-10-31 DIAGNOSIS — Z992 Dependence on renal dialysis: Secondary | ICD-10-CM | POA: Diagnosis not present

## 2022-10-31 DIAGNOSIS — N186 End stage renal disease: Secondary | ICD-10-CM | POA: Diagnosis not present

## 2022-11-01 DIAGNOSIS — I4819 Other persistent atrial fibrillation: Secondary | ICD-10-CM | POA: Diagnosis not present

## 2022-11-02 DIAGNOSIS — D631 Anemia in chronic kidney disease: Secondary | ICD-10-CM | POA: Diagnosis not present

## 2022-11-02 DIAGNOSIS — N186 End stage renal disease: Secondary | ICD-10-CM | POA: Diagnosis not present

## 2022-11-02 DIAGNOSIS — D689 Coagulation defect, unspecified: Secondary | ICD-10-CM | POA: Diagnosis not present

## 2022-11-02 DIAGNOSIS — N2581 Secondary hyperparathyroidism of renal origin: Secondary | ICD-10-CM | POA: Diagnosis not present

## 2022-11-02 DIAGNOSIS — Z992 Dependence on renal dialysis: Secondary | ICD-10-CM | POA: Diagnosis not present

## 2022-11-02 DIAGNOSIS — D509 Iron deficiency anemia, unspecified: Secondary | ICD-10-CM | POA: Diagnosis not present

## 2022-11-02 DIAGNOSIS — E1129 Type 2 diabetes mellitus with other diabetic kidney complication: Secondary | ICD-10-CM | POA: Diagnosis not present

## 2022-11-03 DIAGNOSIS — E1159 Type 2 diabetes mellitus with other circulatory complications: Secondary | ICD-10-CM | POA: Diagnosis not present

## 2022-11-04 NOTE — Telephone Encounter (Signed)
Lets monitor for now.  Thanks MJP

## 2022-11-05 DIAGNOSIS — N2581 Secondary hyperparathyroidism of renal origin: Secondary | ICD-10-CM | POA: Diagnosis not present

## 2022-11-05 DIAGNOSIS — D689 Coagulation defect, unspecified: Secondary | ICD-10-CM | POA: Diagnosis not present

## 2022-11-05 DIAGNOSIS — N186 End stage renal disease: Secondary | ICD-10-CM | POA: Diagnosis not present

## 2022-11-05 DIAGNOSIS — E1129 Type 2 diabetes mellitus with other diabetic kidney complication: Secondary | ICD-10-CM | POA: Diagnosis not present

## 2022-11-05 DIAGNOSIS — D509 Iron deficiency anemia, unspecified: Secondary | ICD-10-CM | POA: Diagnosis not present

## 2022-11-05 DIAGNOSIS — D631 Anemia in chronic kidney disease: Secondary | ICD-10-CM | POA: Diagnosis not present

## 2022-11-05 DIAGNOSIS — Z992 Dependence on renal dialysis: Secondary | ICD-10-CM | POA: Diagnosis not present

## 2022-11-07 DIAGNOSIS — D689 Coagulation defect, unspecified: Secondary | ICD-10-CM | POA: Diagnosis not present

## 2022-11-07 DIAGNOSIS — Z992 Dependence on renal dialysis: Secondary | ICD-10-CM | POA: Diagnosis not present

## 2022-11-07 DIAGNOSIS — N186 End stage renal disease: Secondary | ICD-10-CM | POA: Diagnosis not present

## 2022-11-07 DIAGNOSIS — N2581 Secondary hyperparathyroidism of renal origin: Secondary | ICD-10-CM | POA: Diagnosis not present

## 2022-11-07 DIAGNOSIS — D509 Iron deficiency anemia, unspecified: Secondary | ICD-10-CM | POA: Diagnosis not present

## 2022-11-07 DIAGNOSIS — D631 Anemia in chronic kidney disease: Secondary | ICD-10-CM | POA: Diagnosis not present

## 2022-11-07 DIAGNOSIS — E1129 Type 2 diabetes mellitus with other diabetic kidney complication: Secondary | ICD-10-CM | POA: Diagnosis not present

## 2022-11-08 DIAGNOSIS — C678 Malignant neoplasm of overlapping sites of bladder: Secondary | ICD-10-CM | POA: Diagnosis not present

## 2022-11-08 DIAGNOSIS — R3912 Poor urinary stream: Secondary | ICD-10-CM | POA: Diagnosis not present

## 2022-11-09 DIAGNOSIS — Z992 Dependence on renal dialysis: Secondary | ICD-10-CM | POA: Diagnosis not present

## 2022-11-09 DIAGNOSIS — N2581 Secondary hyperparathyroidism of renal origin: Secondary | ICD-10-CM | POA: Diagnosis not present

## 2022-11-09 DIAGNOSIS — D631 Anemia in chronic kidney disease: Secondary | ICD-10-CM | POA: Diagnosis not present

## 2022-11-09 DIAGNOSIS — E1129 Type 2 diabetes mellitus with other diabetic kidney complication: Secondary | ICD-10-CM | POA: Diagnosis not present

## 2022-11-09 DIAGNOSIS — D689 Coagulation defect, unspecified: Secondary | ICD-10-CM | POA: Diagnosis not present

## 2022-11-09 DIAGNOSIS — N186 End stage renal disease: Secondary | ICD-10-CM | POA: Diagnosis not present

## 2022-11-09 DIAGNOSIS — D509 Iron deficiency anemia, unspecified: Secondary | ICD-10-CM | POA: Diagnosis not present

## 2022-11-12 DIAGNOSIS — N2581 Secondary hyperparathyroidism of renal origin: Secondary | ICD-10-CM | POA: Diagnosis not present

## 2022-11-12 DIAGNOSIS — D631 Anemia in chronic kidney disease: Secondary | ICD-10-CM | POA: Diagnosis not present

## 2022-11-12 DIAGNOSIS — D509 Iron deficiency anemia, unspecified: Secondary | ICD-10-CM | POA: Diagnosis not present

## 2022-11-12 DIAGNOSIS — Z992 Dependence on renal dialysis: Secondary | ICD-10-CM | POA: Diagnosis not present

## 2022-11-12 DIAGNOSIS — N186 End stage renal disease: Secondary | ICD-10-CM | POA: Diagnosis not present

## 2022-11-12 DIAGNOSIS — D689 Coagulation defect, unspecified: Secondary | ICD-10-CM | POA: Diagnosis not present

## 2022-11-12 DIAGNOSIS — E1129 Type 2 diabetes mellitus with other diabetic kidney complication: Secondary | ICD-10-CM | POA: Diagnosis not present

## 2022-11-14 DIAGNOSIS — D631 Anemia in chronic kidney disease: Secondary | ICD-10-CM | POA: Diagnosis not present

## 2022-11-14 DIAGNOSIS — D689 Coagulation defect, unspecified: Secondary | ICD-10-CM | POA: Diagnosis not present

## 2022-11-14 DIAGNOSIS — D509 Iron deficiency anemia, unspecified: Secondary | ICD-10-CM | POA: Diagnosis not present

## 2022-11-14 DIAGNOSIS — N186 End stage renal disease: Secondary | ICD-10-CM | POA: Diagnosis not present

## 2022-11-14 DIAGNOSIS — N2581 Secondary hyperparathyroidism of renal origin: Secondary | ICD-10-CM | POA: Diagnosis not present

## 2022-11-14 DIAGNOSIS — Z992 Dependence on renal dialysis: Secondary | ICD-10-CM | POA: Diagnosis not present

## 2022-11-14 DIAGNOSIS — E1129 Type 2 diabetes mellitus with other diabetic kidney complication: Secondary | ICD-10-CM | POA: Diagnosis not present

## 2022-11-15 DIAGNOSIS — J9690 Respiratory failure, unspecified, unspecified whether with hypoxia or hypercapnia: Secondary | ICD-10-CM | POA: Diagnosis not present

## 2022-11-16 DIAGNOSIS — D509 Iron deficiency anemia, unspecified: Secondary | ICD-10-CM | POA: Diagnosis not present

## 2022-11-16 DIAGNOSIS — E1129 Type 2 diabetes mellitus with other diabetic kidney complication: Secondary | ICD-10-CM | POA: Diagnosis not present

## 2022-11-16 DIAGNOSIS — N2581 Secondary hyperparathyroidism of renal origin: Secondary | ICD-10-CM | POA: Diagnosis not present

## 2022-11-16 DIAGNOSIS — Z992 Dependence on renal dialysis: Secondary | ICD-10-CM | POA: Diagnosis not present

## 2022-11-16 DIAGNOSIS — N186 End stage renal disease: Secondary | ICD-10-CM | POA: Diagnosis not present

## 2022-11-16 DIAGNOSIS — D689 Coagulation defect, unspecified: Secondary | ICD-10-CM | POA: Diagnosis not present

## 2022-11-16 DIAGNOSIS — D631 Anemia in chronic kidney disease: Secondary | ICD-10-CM | POA: Diagnosis not present

## 2022-11-19 DIAGNOSIS — Z992 Dependence on renal dialysis: Secondary | ICD-10-CM | POA: Diagnosis not present

## 2022-11-19 DIAGNOSIS — D509 Iron deficiency anemia, unspecified: Secondary | ICD-10-CM | POA: Diagnosis not present

## 2022-11-19 DIAGNOSIS — N186 End stage renal disease: Secondary | ICD-10-CM | POA: Diagnosis not present

## 2022-11-19 DIAGNOSIS — D631 Anemia in chronic kidney disease: Secondary | ICD-10-CM | POA: Diagnosis not present

## 2022-11-19 DIAGNOSIS — D689 Coagulation defect, unspecified: Secondary | ICD-10-CM | POA: Diagnosis not present

## 2022-11-19 DIAGNOSIS — N2581 Secondary hyperparathyroidism of renal origin: Secondary | ICD-10-CM | POA: Diagnosis not present

## 2022-11-19 DIAGNOSIS — E1129 Type 2 diabetes mellitus with other diabetic kidney complication: Secondary | ICD-10-CM | POA: Diagnosis not present

## 2022-11-21 DIAGNOSIS — Z992 Dependence on renal dialysis: Secondary | ICD-10-CM | POA: Diagnosis not present

## 2022-11-21 DIAGNOSIS — D509 Iron deficiency anemia, unspecified: Secondary | ICD-10-CM | POA: Diagnosis not present

## 2022-11-21 DIAGNOSIS — N186 End stage renal disease: Secondary | ICD-10-CM | POA: Diagnosis not present

## 2022-11-21 DIAGNOSIS — E1129 Type 2 diabetes mellitus with other diabetic kidney complication: Secondary | ICD-10-CM | POA: Diagnosis not present

## 2022-11-21 DIAGNOSIS — D689 Coagulation defect, unspecified: Secondary | ICD-10-CM | POA: Diagnosis not present

## 2022-11-21 DIAGNOSIS — N2581 Secondary hyperparathyroidism of renal origin: Secondary | ICD-10-CM | POA: Diagnosis not present

## 2022-11-21 DIAGNOSIS — D631 Anemia in chronic kidney disease: Secondary | ICD-10-CM | POA: Diagnosis not present

## 2022-11-23 DIAGNOSIS — E1129 Type 2 diabetes mellitus with other diabetic kidney complication: Secondary | ICD-10-CM | POA: Diagnosis not present

## 2022-11-23 DIAGNOSIS — D689 Coagulation defect, unspecified: Secondary | ICD-10-CM | POA: Diagnosis not present

## 2022-11-23 DIAGNOSIS — N186 End stage renal disease: Secondary | ICD-10-CM | POA: Diagnosis not present

## 2022-11-23 DIAGNOSIS — D631 Anemia in chronic kidney disease: Secondary | ICD-10-CM | POA: Diagnosis not present

## 2022-11-23 DIAGNOSIS — N2581 Secondary hyperparathyroidism of renal origin: Secondary | ICD-10-CM | POA: Diagnosis not present

## 2022-11-23 DIAGNOSIS — Z992 Dependence on renal dialysis: Secondary | ICD-10-CM | POA: Diagnosis not present

## 2022-11-23 DIAGNOSIS — D509 Iron deficiency anemia, unspecified: Secondary | ICD-10-CM | POA: Diagnosis not present

## 2022-11-25 ENCOUNTER — Encounter (HOSPITAL_COMMUNITY): Payer: Self-pay

## 2022-11-25 ENCOUNTER — Emergency Department (HOSPITAL_COMMUNITY)
Admission: EM | Admit: 2022-11-25 | Discharge: 2022-11-26 | Disposition: A | Discharge status: Home / Self Care | Payer: Medicare Other | Attending: Emergency Medicine | Admitting: Emergency Medicine

## 2022-11-25 DIAGNOSIS — S01512A Laceration without foreign body of oral cavity, initial encounter: Secondary | ICD-10-CM | POA: Insufficient documentation

## 2022-11-25 DIAGNOSIS — Z96651 Presence of right artificial knee joint: Secondary | ICD-10-CM | POA: Insufficient documentation

## 2022-11-25 DIAGNOSIS — N186 End stage renal disease: Secondary | ICD-10-CM | POA: Insufficient documentation

## 2022-11-25 DIAGNOSIS — Z955 Presence of coronary angioplasty implant and graft: Secondary | ICD-10-CM | POA: Diagnosis not present

## 2022-11-25 DIAGNOSIS — S0993XA Unspecified injury of face, initial encounter: Secondary | ICD-10-CM | POA: Diagnosis present

## 2022-11-25 DIAGNOSIS — Z7901 Long term (current) use of anticoagulants: Secondary | ICD-10-CM | POA: Insufficient documentation

## 2022-11-25 DIAGNOSIS — I509 Heart failure, unspecified: Secondary | ICD-10-CM | POA: Diagnosis not present

## 2022-11-25 DIAGNOSIS — Z794 Long term (current) use of insulin: Secondary | ICD-10-CM | POA: Insufficient documentation

## 2022-11-25 DIAGNOSIS — I2510 Atherosclerotic heart disease of native coronary artery without angina pectoris: Secondary | ICD-10-CM | POA: Insufficient documentation

## 2022-11-25 DIAGNOSIS — E1140 Type 2 diabetes mellitus with diabetic neuropathy, unspecified: Secondary | ICD-10-CM | POA: Insufficient documentation

## 2022-11-25 DIAGNOSIS — I132 Hypertensive heart and chronic kidney disease with heart failure and with stage 5 chronic kidney disease, or end stage renal disease: Secondary | ICD-10-CM | POA: Insufficient documentation

## 2022-11-25 DIAGNOSIS — R58 Hemorrhage, not elsewhere classified: Secondary | ICD-10-CM | POA: Diagnosis not present

## 2022-11-25 DIAGNOSIS — Z79899 Other long term (current) drug therapy: Secondary | ICD-10-CM | POA: Diagnosis not present

## 2022-11-25 DIAGNOSIS — E11319 Type 2 diabetes mellitus with unspecified diabetic retinopathy without macular edema: Secondary | ICD-10-CM | POA: Insufficient documentation

## 2022-11-25 DIAGNOSIS — Z87891 Personal history of nicotine dependence: Secondary | ICD-10-CM | POA: Diagnosis not present

## 2022-11-25 DIAGNOSIS — X58XXXA Exposure to other specified factors, initial encounter: Secondary | ICD-10-CM | POA: Diagnosis not present

## 2022-11-25 DIAGNOSIS — Z992 Dependence on renal dialysis: Secondary | ICD-10-CM | POA: Insufficient documentation

## 2022-11-25 MED ORDER — TRANEXAMIC ACID FOR EPISTAXIS
500.0000 mg | Freq: Once | TOPICAL | Status: AC
  Administered 2022-11-25: 500 mg via TOPICAL
  Filled 2022-11-25 (×2): qty 10
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
## 2022-11-25 NOTE — ED Triage Notes (Signed)
Pt c/o laceration to tongue. Pt reports he was eating a pickle tonight around 20:30 and bit his tongue and it will not stop bleeding. Pt is on 2 different blood thinners (eliquis & plavix).

## 2022-11-26 DIAGNOSIS — E1129 Type 2 diabetes mellitus with other diabetic kidney complication: Secondary | ICD-10-CM | POA: Diagnosis not present

## 2022-11-26 DIAGNOSIS — Z992 Dependence on renal dialysis: Secondary | ICD-10-CM | POA: Diagnosis not present

## 2022-11-26 DIAGNOSIS — D689 Coagulation defect, unspecified: Secondary | ICD-10-CM | POA: Diagnosis not present

## 2022-11-26 DIAGNOSIS — D509 Iron deficiency anemia, unspecified: Secondary | ICD-10-CM | POA: Diagnosis not present

## 2022-11-26 DIAGNOSIS — N186 End stage renal disease: Secondary | ICD-10-CM | POA: Diagnosis not present

## 2022-11-26 DIAGNOSIS — N2581 Secondary hyperparathyroidism of renal origin: Secondary | ICD-10-CM | POA: Diagnosis not present

## 2022-11-26 DIAGNOSIS — D631 Anemia in chronic kidney disease: Secondary | ICD-10-CM | POA: Diagnosis not present

## 2022-11-28 DIAGNOSIS — N186 End stage renal disease: Secondary | ICD-10-CM | POA: Diagnosis not present

## 2022-11-28 DIAGNOSIS — Z992 Dependence on renal dialysis: Secondary | ICD-10-CM | POA: Diagnosis not present

## 2022-11-28 DIAGNOSIS — D631 Anemia in chronic kidney disease: Secondary | ICD-10-CM | POA: Diagnosis not present

## 2022-11-28 DIAGNOSIS — D689 Coagulation defect, unspecified: Secondary | ICD-10-CM | POA: Diagnosis not present

## 2022-11-28 DIAGNOSIS — D509 Iron deficiency anemia, unspecified: Secondary | ICD-10-CM | POA: Diagnosis not present

## 2022-11-28 DIAGNOSIS — N2581 Secondary hyperparathyroidism of renal origin: Secondary | ICD-10-CM | POA: Diagnosis not present

## 2022-11-28 DIAGNOSIS — E1129 Type 2 diabetes mellitus with other diabetic kidney complication: Secondary | ICD-10-CM | POA: Diagnosis not present

## 2022-11-30 DIAGNOSIS — D689 Coagulation defect, unspecified: Secondary | ICD-10-CM | POA: Diagnosis not present

## 2022-11-30 DIAGNOSIS — D509 Iron deficiency anemia, unspecified: Secondary | ICD-10-CM | POA: Diagnosis not present

## 2022-11-30 DIAGNOSIS — E1129 Type 2 diabetes mellitus with other diabetic kidney complication: Secondary | ICD-10-CM | POA: Diagnosis not present

## 2022-11-30 DIAGNOSIS — N186 End stage renal disease: Secondary | ICD-10-CM | POA: Diagnosis not present

## 2022-11-30 DIAGNOSIS — R52 Pain, unspecified: Secondary | ICD-10-CM | POA: Diagnosis not present

## 2022-11-30 DIAGNOSIS — N2581 Secondary hyperparathyroidism of renal origin: Secondary | ICD-10-CM | POA: Diagnosis not present

## 2022-11-30 DIAGNOSIS — D631 Anemia in chronic kidney disease: Secondary | ICD-10-CM | POA: Diagnosis not present

## 2022-11-30 DIAGNOSIS — Z992 Dependence on renal dialysis: Secondary | ICD-10-CM | POA: Diagnosis not present

## 2022-12-02 DIAGNOSIS — I4819 Other persistent atrial fibrillation: Secondary | ICD-10-CM | POA: Diagnosis not present

## 2022-12-03 DIAGNOSIS — D509 Iron deficiency anemia, unspecified: Secondary | ICD-10-CM | POA: Diagnosis not present

## 2022-12-03 DIAGNOSIS — D689 Coagulation defect, unspecified: Secondary | ICD-10-CM | POA: Diagnosis not present

## 2022-12-03 DIAGNOSIS — D631 Anemia in chronic kidney disease: Secondary | ICD-10-CM | POA: Diagnosis not present

## 2022-12-03 DIAGNOSIS — Z992 Dependence on renal dialysis: Secondary | ICD-10-CM | POA: Diagnosis not present

## 2022-12-03 DIAGNOSIS — R52 Pain, unspecified: Secondary | ICD-10-CM | POA: Diagnosis not present

## 2022-12-03 DIAGNOSIS — E1129 Type 2 diabetes mellitus with other diabetic kidney complication: Secondary | ICD-10-CM | POA: Diagnosis not present

## 2022-12-03 DIAGNOSIS — N186 End stage renal disease: Secondary | ICD-10-CM | POA: Diagnosis not present

## 2022-12-03 DIAGNOSIS — N2581 Secondary hyperparathyroidism of renal origin: Secondary | ICD-10-CM | POA: Diagnosis not present

## 2022-12-04 DIAGNOSIS — E1159 Type 2 diabetes mellitus with other circulatory complications: Secondary | ICD-10-CM | POA: Diagnosis not present

## 2022-12-05 DIAGNOSIS — R52 Pain, unspecified: Secondary | ICD-10-CM | POA: Diagnosis not present

## 2022-12-05 DIAGNOSIS — D509 Iron deficiency anemia, unspecified: Secondary | ICD-10-CM | POA: Diagnosis not present

## 2022-12-05 DIAGNOSIS — N2581 Secondary hyperparathyroidism of renal origin: Secondary | ICD-10-CM | POA: Diagnosis not present

## 2022-12-05 DIAGNOSIS — N186 End stage renal disease: Secondary | ICD-10-CM | POA: Diagnosis not present

## 2022-12-05 DIAGNOSIS — E1129 Type 2 diabetes mellitus with other diabetic kidney complication: Secondary | ICD-10-CM | POA: Diagnosis not present

## 2022-12-05 DIAGNOSIS — D689 Coagulation defect, unspecified: Secondary | ICD-10-CM | POA: Diagnosis not present

## 2022-12-05 DIAGNOSIS — D631 Anemia in chronic kidney disease: Secondary | ICD-10-CM | POA: Diagnosis not present

## 2022-12-05 DIAGNOSIS — Z992 Dependence on renal dialysis: Secondary | ICD-10-CM | POA: Diagnosis not present

## 2022-12-07 DIAGNOSIS — D689 Coagulation defect, unspecified: Secondary | ICD-10-CM | POA: Diagnosis not present

## 2022-12-07 DIAGNOSIS — N2581 Secondary hyperparathyroidism of renal origin: Secondary | ICD-10-CM | POA: Diagnosis not present

## 2022-12-07 DIAGNOSIS — N186 End stage renal disease: Secondary | ICD-10-CM | POA: Diagnosis not present

## 2022-12-07 DIAGNOSIS — Z992 Dependence on renal dialysis: Secondary | ICD-10-CM | POA: Diagnosis not present

## 2022-12-07 DIAGNOSIS — R52 Pain, unspecified: Secondary | ICD-10-CM | POA: Diagnosis not present

## 2022-12-07 DIAGNOSIS — D509 Iron deficiency anemia, unspecified: Secondary | ICD-10-CM | POA: Diagnosis not present

## 2022-12-07 DIAGNOSIS — D631 Anemia in chronic kidney disease: Secondary | ICD-10-CM | POA: Diagnosis not present

## 2022-12-07 DIAGNOSIS — E1129 Type 2 diabetes mellitus with other diabetic kidney complication: Secondary | ICD-10-CM | POA: Diagnosis not present

## 2022-12-10 DIAGNOSIS — D689 Coagulation defect, unspecified: Secondary | ICD-10-CM | POA: Diagnosis not present

## 2022-12-10 DIAGNOSIS — D631 Anemia in chronic kidney disease: Secondary | ICD-10-CM | POA: Diagnosis not present

## 2022-12-10 DIAGNOSIS — D509 Iron deficiency anemia, unspecified: Secondary | ICD-10-CM | POA: Diagnosis not present

## 2022-12-10 DIAGNOSIS — E1129 Type 2 diabetes mellitus with other diabetic kidney complication: Secondary | ICD-10-CM | POA: Diagnosis not present

## 2022-12-10 DIAGNOSIS — N2581 Secondary hyperparathyroidism of renal origin: Secondary | ICD-10-CM | POA: Diagnosis not present

## 2022-12-10 DIAGNOSIS — Z992 Dependence on renal dialysis: Secondary | ICD-10-CM | POA: Diagnosis not present

## 2022-12-10 DIAGNOSIS — N186 End stage renal disease: Secondary | ICD-10-CM | POA: Diagnosis not present

## 2022-12-10 DIAGNOSIS — R52 Pain, unspecified: Secondary | ICD-10-CM | POA: Diagnosis not present

## 2022-12-14 DIAGNOSIS — N186 End stage renal disease: Secondary | ICD-10-CM | POA: Diagnosis not present

## 2022-12-14 DIAGNOSIS — Z992 Dependence on renal dialysis: Secondary | ICD-10-CM | POA: Diagnosis not present

## 2022-12-14 DIAGNOSIS — D509 Iron deficiency anemia, unspecified: Secondary | ICD-10-CM | POA: Diagnosis not present

## 2022-12-14 DIAGNOSIS — D631 Anemia in chronic kidney disease: Secondary | ICD-10-CM | POA: Diagnosis not present

## 2022-12-14 DIAGNOSIS — N2581 Secondary hyperparathyroidism of renal origin: Secondary | ICD-10-CM | POA: Diagnosis not present

## 2022-12-14 DIAGNOSIS — D689 Coagulation defect, unspecified: Secondary | ICD-10-CM | POA: Diagnosis not present

## 2022-12-14 DIAGNOSIS — R52 Pain, unspecified: Secondary | ICD-10-CM | POA: Diagnosis not present

## 2022-12-14 DIAGNOSIS — E1129 Type 2 diabetes mellitus with other diabetic kidney complication: Secondary | ICD-10-CM | POA: Diagnosis not present

## 2022-12-16 DIAGNOSIS — J9690 Respiratory failure, unspecified, unspecified whether with hypoxia or hypercapnia: Secondary | ICD-10-CM | POA: Diagnosis not present

## 2022-12-17 DIAGNOSIS — R52 Pain, unspecified: Secondary | ICD-10-CM | POA: Diagnosis not present

## 2022-12-17 DIAGNOSIS — D631 Anemia in chronic kidney disease: Secondary | ICD-10-CM | POA: Diagnosis not present

## 2022-12-17 DIAGNOSIS — N186 End stage renal disease: Secondary | ICD-10-CM | POA: Diagnosis not present

## 2022-12-17 DIAGNOSIS — D509 Iron deficiency anemia, unspecified: Secondary | ICD-10-CM | POA: Diagnosis not present

## 2022-12-17 DIAGNOSIS — N2581 Secondary hyperparathyroidism of renal origin: Secondary | ICD-10-CM | POA: Diagnosis not present

## 2022-12-17 DIAGNOSIS — E1129 Type 2 diabetes mellitus with other diabetic kidney complication: Secondary | ICD-10-CM | POA: Diagnosis not present

## 2022-12-17 DIAGNOSIS — D689 Coagulation defect, unspecified: Secondary | ICD-10-CM | POA: Diagnosis not present

## 2022-12-17 DIAGNOSIS — Z992 Dependence on renal dialysis: Secondary | ICD-10-CM | POA: Diagnosis not present

## 2022-12-19 ENCOUNTER — Other Ambulatory Visit: Payer: Self-pay

## 2022-12-19 ENCOUNTER — Encounter (HOSPITAL_COMMUNITY): Payer: Self-pay

## 2022-12-19 DIAGNOSIS — E1129 Type 2 diabetes mellitus with other diabetic kidney complication: Secondary | ICD-10-CM | POA: Diagnosis not present

## 2022-12-19 DIAGNOSIS — I10 Essential (primary) hypertension: Secondary | ICD-10-CM

## 2022-12-19 DIAGNOSIS — N186 End stage renal disease: Secondary | ICD-10-CM | POA: Diagnosis not present

## 2022-12-19 DIAGNOSIS — D631 Anemia in chronic kidney disease: Secondary | ICD-10-CM | POA: Diagnosis not present

## 2022-12-19 DIAGNOSIS — D689 Coagulation defect, unspecified: Secondary | ICD-10-CM | POA: Diagnosis not present

## 2022-12-19 DIAGNOSIS — R52 Pain, unspecified: Secondary | ICD-10-CM | POA: Diagnosis not present

## 2022-12-19 DIAGNOSIS — N2581 Secondary hyperparathyroidism of renal origin: Secondary | ICD-10-CM | POA: Diagnosis not present

## 2022-12-19 DIAGNOSIS — D509 Iron deficiency anemia, unspecified: Secondary | ICD-10-CM | POA: Diagnosis not present

## 2022-12-19 DIAGNOSIS — Z992 Dependence on renal dialysis: Secondary | ICD-10-CM | POA: Diagnosis not present

## 2022-12-19 MED ORDER — LOSARTAN POTASSIUM 100 MG PO TABS: 100.0000 mg | ORAL_TABLET | Freq: Every day | ORAL | 0 refills | Status: DC

## 2022-12-19 NOTE — Progress Notes (Signed)
Per discussion with Dr. Virgina Jock - increasing losartan 60m daily to 1051mdaily due to elevated home blood pressure  12/19/22 7:59 AM  159 / 84 mmHg 71 bpm  12/18/22 5:29 PM  173 / 91 mmHg 80 bpm  12/18/22 9:34 AM  170 / 89 mmHg 73 bpm  12/18/22 9:31 AM  173 / 90 mmHg 74 bpm  12/17/22 7:52 AM  161 / 80 mmHg 79 bpm  12/16/22 9:06 AM  155 / 84 mmHg 67 bpm  12/14/22 3:32 PM  163 / 78 mmHg 77 bpm  12/10/22 3:31 PM  149 / 74 mmHg 86 bpm  12/07/22 8:26 AM  157 / 81 mmHg 69 bpm

## 2022-12-21 DIAGNOSIS — D509 Iron deficiency anemia, unspecified: Secondary | ICD-10-CM | POA: Diagnosis not present

## 2022-12-21 DIAGNOSIS — E1129 Type 2 diabetes mellitus with other diabetic kidney complication: Secondary | ICD-10-CM | POA: Diagnosis not present

## 2022-12-21 DIAGNOSIS — R52 Pain, unspecified: Secondary | ICD-10-CM | POA: Diagnosis not present

## 2022-12-21 DIAGNOSIS — N186 End stage renal disease: Secondary | ICD-10-CM | POA: Diagnosis not present

## 2022-12-21 DIAGNOSIS — N2581 Secondary hyperparathyroidism of renal origin: Secondary | ICD-10-CM | POA: Diagnosis not present

## 2022-12-21 DIAGNOSIS — Z992 Dependence on renal dialysis: Secondary | ICD-10-CM | POA: Diagnosis not present

## 2022-12-21 DIAGNOSIS — D689 Coagulation defect, unspecified: Secondary | ICD-10-CM | POA: Diagnosis not present

## 2022-12-21 DIAGNOSIS — D631 Anemia in chronic kidney disease: Secondary | ICD-10-CM | POA: Diagnosis not present

## 2022-12-24 ENCOUNTER — Telehealth: Payer: Self-pay

## 2022-12-24 DIAGNOSIS — E1129 Type 2 diabetes mellitus with other diabetic kidney complication: Secondary | ICD-10-CM | POA: Diagnosis not present

## 2022-12-24 DIAGNOSIS — R52 Pain, unspecified: Secondary | ICD-10-CM | POA: Diagnosis not present

## 2022-12-24 DIAGNOSIS — N186 End stage renal disease: Secondary | ICD-10-CM | POA: Diagnosis not present

## 2022-12-24 DIAGNOSIS — N2581 Secondary hyperparathyroidism of renal origin: Secondary | ICD-10-CM | POA: Diagnosis not present

## 2022-12-24 DIAGNOSIS — D689 Coagulation defect, unspecified: Secondary | ICD-10-CM | POA: Diagnosis not present

## 2022-12-24 DIAGNOSIS — D509 Iron deficiency anemia, unspecified: Secondary | ICD-10-CM | POA: Diagnosis not present

## 2022-12-24 DIAGNOSIS — Z992 Dependence on renal dialysis: Secondary | ICD-10-CM | POA: Diagnosis not present

## 2022-12-24 DIAGNOSIS — D631 Anemia in chronic kidney disease: Secondary | ICD-10-CM | POA: Diagnosis not present

## 2022-12-26 ENCOUNTER — Telehealth: Payer: Self-pay

## 2022-12-26 ENCOUNTER — Ambulatory Visit: Payer: Medicare HMO | Admitting: Cardiology

## 2022-12-26 DIAGNOSIS — I2510 Atherosclerotic heart disease of native coronary artery without angina pectoris: Secondary | ICD-10-CM | POA: Diagnosis not present

## 2022-12-26 DIAGNOSIS — E7800 Pure hypercholesterolemia, unspecified: Secondary | ICD-10-CM | POA: Diagnosis not present

## 2022-12-26 DIAGNOSIS — Z992 Dependence on renal dialysis: Secondary | ICD-10-CM | POA: Diagnosis not present

## 2022-12-26 DIAGNOSIS — H35033 Hypertensive retinopathy, bilateral: Secondary | ICD-10-CM | POA: Diagnosis not present

## 2022-12-26 DIAGNOSIS — D689 Coagulation defect, unspecified: Secondary | ICD-10-CM | POA: Diagnosis not present

## 2022-12-26 DIAGNOSIS — D631 Anemia in chronic kidney disease: Secondary | ICD-10-CM | POA: Diagnosis not present

## 2022-12-26 DIAGNOSIS — N186 End stage renal disease: Secondary | ICD-10-CM | POA: Diagnosis not present

## 2022-12-26 DIAGNOSIS — E1140 Type 2 diabetes mellitus with diabetic neuropathy, unspecified: Secondary | ICD-10-CM | POA: Diagnosis not present

## 2022-12-26 DIAGNOSIS — E1129 Type 2 diabetes mellitus with other diabetic kidney complication: Secondary | ICD-10-CM | POA: Diagnosis not present

## 2022-12-26 DIAGNOSIS — N2581 Secondary hyperparathyroidism of renal origin: Secondary | ICD-10-CM | POA: Diagnosis not present

## 2022-12-26 DIAGNOSIS — I5032 Chronic diastolic (congestive) heart failure: Secondary | ICD-10-CM | POA: Diagnosis not present

## 2022-12-26 DIAGNOSIS — D509 Iron deficiency anemia, unspecified: Secondary | ICD-10-CM | POA: Diagnosis not present

## 2022-12-26 DIAGNOSIS — E1169 Type 2 diabetes mellitus with other specified complication: Secondary | ICD-10-CM | POA: Diagnosis not present

## 2022-12-26 DIAGNOSIS — D649 Anemia, unspecified: Secondary | ICD-10-CM | POA: Diagnosis not present

## 2022-12-26 DIAGNOSIS — R52 Pain, unspecified: Secondary | ICD-10-CM | POA: Diagnosis not present

## 2022-12-26 DIAGNOSIS — I10 Essential (primary) hypertension: Secondary | ICD-10-CM | POA: Diagnosis not present

## 2022-12-26 NOTE — Telephone Encounter (Signed)
Called patient no answer phone just rings.

## 2022-12-26 NOTE — Telephone Encounter (Signed)
Patient home blood pressure is not at goal. Increased losartan '50mg'$  daily to '100mg'$  daily on 12/19/22. Unknown if patient has started the increased dose. Patient not compliant with daily BP checks (only 14-days of readings this month)  Systolic Blood Pressure  0000000 (Q000111Q - 123456) Diastolic Blood Pressure  0000000 (71.0 - 91.0) Heart Rate  73.5 (66.0 - 86.0)  12/25/22 9:18 PM  160 / 83 mmHg 70 bpm  12/24/22 3:50 PM  161 / 86 mmHg 73 bpm  12/20/22 7:20 AM  160 / 82 mmHg 69 bpm  12/19/22 7:59 AM  159 / 84 mmHg 71 bpm  12/18/22 5:29 PM  173 / 91 mmHg 80 bpm  12/18/22 9:34 AM  170 / 89 mmHg 73 bpm  12/18/22 9:31 AM  173 / 90 mmHg 74 bpm  12/17/22 7:52 AM  161 / 80 mmHg 79 bpm  12/16/22 9:06 AM  155 / 84 mmHg 67 bpm  12/14/22 3:32 PM  163 / 78 mmHg 77 bpm

## 2022-12-26 NOTE — Telephone Encounter (Signed)
He had an appt with me today but he did not make it. Please reschedule where I can go over all this with the patient. At this time, recommend resuming eliquis, hydralazine and isosorbide. Can hold plavix for now.  Thanks MJP

## 2022-12-26 NOTE — Progress Notes (Deleted)
Patient is here for follow up visit.  Subjective:   William Webb William Webb, male    DOB: 08/01/1951, 72 y.o.   MRN: [id number]   No chief complaint on file.  HPI  72 y.o. 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 Increased losartan '50mg'$  daily to '100mg'$  daily on 12/19/22. Unknown if patient has started the increased dose. Patient not compliant with daily BP checks (only 14-days of readings this month)   Systolic Blood Pressure           157.5 (Q000111Q - 123456) Diastolic Blood Pressure         81.2 (71.0 - 91.0) Heart Rate        73.5 (66.0 - 86.0)   12/25/22 9:18 PM                     160      /           83        mmHg  70        bpm      12/24/22 3:50 PM                     161      /           86        mmHg  73        bpm      12/20/22 7:20 AM                     160      /           82        mmHg  69        bpm      12/19/22 7:59 AM                     159      /           84        mmHg  71        bpm      12/18/22 5:29 PM                     173      /           91        mmHg  80        bpm      12/18/22 9:34 AM                     170      /           89        mmHg  73        bpm      12/18/22 9:31 AM                     173      /           90        mmHg  74  bpm      12/17/22 7:52 AM                     161      /           80        mmHg  79        bpm      12/16/22 9:06 AM                     155      /           84        mmHg  67        bpm      12/14/22 3:32 PM                     163      /           78        mmHg  77        bpm        Current Outpatient Medications:    acetaminophen (TYLENOL) 650 MG CR tablet, Take 1,300 mg by mouth at bedtime., Disp: , Rfl:    Alcohol Swabs (B-D SINGLE USE SWABS REGULAR) PADS, , Disp: , Rfl:    apixaban  (ELIQUIS) 5 MG TABS tablet, Take 1 tablet (5 mg total) by mouth 2 (two) times daily., Disp: 180 tablet, Rfl: 3   atorvastatin (LIPITOR) 80 MG tablet, Take 1 tablet (80 mg total) by mouth daily., Disp: 30 tablet, Rfl: 1   B Complex-C-Zn-Folic Acid (DIALYVITE Q000111Q WITH ZINC) 0.8 MG TABS, SMARTSIG:1 Tablet(s) By Mouth Every Evening, Disp: , Rfl:    Blood Glucose Monitoring Suppl (ACCU-CHEK GUIDE ME) w/Device KIT, 1 Device by Does not apply route 4 (four) times daily -  before meals and at bedtime., Disp: 1 kit, Rfl: 0   Calcium Carbonate-Simethicone (ALKA-SELTZER HEARTBURN + GAS) 750-80 MG CHEW, Chew 1 tablet by mouth 3 (three) times daily as needed (indigestion/heartburn.)., Disp: , Rfl:    carvedilol (COREG) 25 MG tablet, Take 25 mg by mouth 2 (two) times daily., Disp: , Rfl:    clopidogrel (PLAVIX) 75 MG tablet, Take 1 tablet (75 mg total) by mouth daily., Disp: 90 tablet, Rfl: 3   Continuous Blood Gluc Sensor (DEXCOM G6 SENSOR) MISC, 1 Device by Does not apply route See admin instructions. Change every 10 days, Disp: 9 each, Rfl: 3   Continuous Blood Gluc Sensor (DEXCOM G6 SENSOR) MISC, 1 Device by Does not apply route See admin instructions. Change every 10 days, Disp: 9 each, Rfl: 3   Cyanocobalamin (VITAMIN B-12) 5000 MCG TBDP, Take 5,000 mcg by mouth in the morning., Disp: , Rfl:    Dextromethorphan-guaiFENesin (CORICIDIN HBP CONGESTION/COUGH) 10-200 MG CAPS, Take 2 capsules by mouth daily as needed (cough/congestion)., Disp: , Rfl:    furosemide (LASIX) 80 MG tablet, Take 80 mg by mouth See admin instructions. Take 80 mg by mouth twice a day and additional 80 mg before bedtime as needed for swelling of the ankles, Disp: , Rfl:    gabapentin (NEURONTIN) 600 MG tablet, Take 0.5 tablets (300 mg total) by mouth 2 (two) times daily., Disp: 60 tablet, Rfl: 0   GEBAUERS PAIN EASE AERO, Apply topically., Disp: , Rfl:    glucose 4 GM chewable tablet, Chew 1 tablet by mouth as needed for  low blood sugar.,  Disp: , Rfl:    hydrALAZINE (APRESOLINE) 100 MG tablet, Take 1 tablet (100 mg total) by mouth 3 (three) times daily., Disp: 90 tablet, Rfl: 0   isosorbide mononitrate (IMDUR) 60 MG 24 hr tablet, Take 1 tablet (60 mg total) by mouth daily. (Patient taking differently: Take 60 mg by mouth at bedtime.), Disp: 30 tablet, Rfl: 0   ketorolac (ACULAR) 0.5 % ophthalmic solution, Place 1 drop into both eyes daily as needed (inflamation)., Disp: , Rfl:    Lidocaine 4 % AERO, Apply 1 spray topically as needed (prior to port being accessed.)., Disp: , Rfl:    lidocaine-prilocaine (EMLA) cream, 1 application as needed (prior to port being accessed)., Disp: , Rfl:    loratadine (CLARITIN) 10 MG tablet, Take 10 mg by mouth daily as needed for allergies., Disp: , Rfl:    losartan (COZAAR) 100 MG tablet, Take 1 tablet (100 mg total) by mouth daily., Disp: 90 tablet, Rfl: 0   NOVOLOG FLEXPEN 100 UNIT/ML FlexPen, INJECT 3 UNITS INTO THE SKIN 3 (THREE) TIMES DAILY WITH MEALS., Disp: 15 mL, Rfl: 11   ondansetron (ZOFRAN) 4 MG tablet, Take 1 tablet (4 mg total) by mouth every 8 (eight) hours as needed for nausea., Disp: 20 tablet, Rfl: 0   pantoprazole (PROTONIX) 40 MG tablet, Take 1 tablet (40 mg total) by mouth daily., Disp: 30 tablet, Rfl: 0   rosuvastatin (CRESTOR) 10 MG tablet, Take 10 mg by mouth daily., Disp: , Rfl:    sevelamer carbonate (RENVELA) 800 MG tablet, Take 800 mg by mouth with breakfast, with lunch, and with evening meal., Disp: , Rfl:    tamsulosin (FLOMAX) 0.4 MG CAPS capsule, Take 0.4 mg by mouth at bedtime., Disp: , Rfl:    traMADol (ULTRAM) 50 MG tablet, Take 1 tablet (50 mg total) by mouth every 12 (twelve) hours as needed for up to 8 doses., Disp: 8 tablet, Rfl: 0   VELPHORO 500 MG chewable tablet, Chew 500 mg by mouth 3 (three) times daily., Disp: , Rfl:    vitamin E 1000 UNIT capsule, Take 1,000 Units by mouth daily., Disp: , Rfl:   Cardiovascular studies:  EKG 09/27/2022: Sinus rhythm 64  bpm  LVH Nonspecific T-abnormality  LEA duplex 03/17/2022: Right: 50-74% stenosis noted in the superficial femoral artery. 30-49%  stenosis noted in the peroneal artery. Diffuse calcific plaque noted  throughout with turbulent flow. 50-74% mid SFA stenosis, and 30-49% distal  peroneal stenosis. Collateral flow  noted at proximal ATA. Multiple vessels not well visualized secondary to  patient's constant leg motion and squeezing/trapping of technologist's  hand.   Left: Diffuse calcific plaque noted throughout with turbulent flow. 30-49%  proximal SFA stenosis, 50-74% mid SFA stenosis, and 30-49% stenosis noted  distal ATA and mid PTA. Multiple vessels not well visualized secondary to  patient's constant leg motion  and squeezing/trapping of technologist's hand.    PV intervention 03/14/2022: Rt TP trunk 80% stenosis Rt mid common peroneal artery 95% stenosis Prox occlusion of Rt ATA, Rt PT   Successful orbital atherectomy and PTA 3.0X60 mm balloon to Rt TP trunk and Rt mid common peroneal artery with 0% residual stenosis   Expect improvement in foot circulation and healing post amputation at the level of toe or transmetatarsal amputation. If difficulty healing noted, could then consider revascularization to Rt ATA with retrograde access to Rt DP.   Echocardiogram 03/15/2022: 1. Left ventricular ejection fraction, by estimation, is 55 to 60%. The  left ventricle has normal function. The left ventricle has no regional  wall motion abnormalities. There is moderate left ventricular hypertrophy.  Left ventricular diastolic  parameters are consistent with Grade II diastolic dysfunction  (pseudonormalization). Elevated left atrial pressure.   2. Right ventricular systolic function is normal. The right ventricular  size is normal.   3. Left atrial size was severely dilated.   4. Right atrial size was moderately dilated.   5. A small pericardial effusion is present. The pericardial effusion  is  posterior to the left ventricle.   6. The mitral valve is normal in structure. Mild mitral valve  regurgitation. No evidence of mitral stenosis.   7. The aortic valve is calcified. Aortic valve regurgitation is mild.  Aortic valve area, by VTI measures 1.21 cm. Aortic valve mean gradient  measures 17.0 mmHg. Aortic valve Vmax measures 2.62 m/s.   8. The inferior vena cava is normal in size with greater than 50%  respiratory variability, suggesting right atrial pressure of 3 mmHg.   Comparison(s): A prior study was performed on 09/15/2020. No significant  change from prior study.   Coronary angiogram 10/20/2018: LM: Normal LAD: Distal 70% diffuse disease.        Ostial 90% stenosis in Diag1. Patent prox Diag 1 stent LCx: Large OM1 with patent prior stent RCA: 100% occluded RPDA        Partially successful PTCA attempt        100%--99% stenosis        TIMI flow 0-I  LVEDP 16 mmHg  Echocardiogram 10/17/2018: Left ventricle: The cavity size was normal. Wall thickness was increased in a pattern of severe LVH. Systolic function was normal. The estimated ejection fraction was in the range of 50% to 55%. Wall motion was normal; there were no regional wall motion abnormalities. Doppler parameters are consistent with anormal left ventricular relaxation (grade 1 diastolic dysfunction). Doppler parameters are consistent with high ventricular filling pressure. - Left atrium: The atrium was moderately dilated. - Right atrium: The atrium was mildly dilated.   Impressions: - Low normal to mildly reduced LV systolic function; EF 50; severe LVH; mild diastolic dysfunction; biatrial enlargement.  Recent Labs: 03/19/2022: Glucose 141, BUN/Cr 33/7.22. EGFR 7. Na/K 133/4.0. Albumin 2.5. Phos 4.9. Rest of the CMP normal H/H 9.7/29.6. MCV 91. Platelets 159  01/19/2021: Hb 11.1 HbA1C 6.3% TSH 2.2  Review of Systems  Cardiovascular:  Negative for chest pain, dyspnea on exertion, leg swelling,  palpitations and syncope.        Objective:    There were no vitals filed for this visit.  Physical Exam Vitals and nursing note reviewed.  Constitutional:      General: He is not in acute distress. Neck:     Vascular: No JVD.  Cardiovascular:     Rate and Rhythm: Normal rate and regular rhythm.     Pulses:          Dorsalis pedis pulses are 0 on the right side and 0 on the left side.       Posterior tibial pulses are 1+ on the right side and 0 on the left side.     Heart sounds: Normal heart sounds. No murmur heard.    Comments: 2nd toe apmputation. Healing well Pulmonary:     Effort: Pulmonary effort is normal.     Breath sounds: Normal breath sounds. No wheezing or rales.  Musculoskeletal:     Right lower leg: No edema.     Left  lower leg: No edema.        Assessment & Recommendations:   72 y.o. 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  Added losartan 50 mg daily. Arranged for remote patient monitoring through pur pharmacist Haynes Dage.  PAD: S/p revascularization to TP trunk, peroneal revascularization Continue plavix 75 mg daily  Atrial fibrillation: H/o persistent, now in sinus rhythm s/p cardioversion 09/29/2019. CHA2DS2VASc score 4, annual stroke risk 5%. Continue eliquis 5 mg bid.   CAD: CAD s/p prior PCI, NSTEMI in 09/2018, attempted but unsuccessful. PTCA to chronically occluded Rt PDA Stable without angina symptoms.   F/u in 3 months   Nigel Mormon, MD Pager: [phone] Office: [phone]

## 2022-12-26 NOTE — Telephone Encounter (Signed)
Ronny Bacon with [facility] is calling to let us know that patient has not been taking his plavix, hydralazine, and isosorbide, she is wondering if you want the patient to start back on these medications. She says when she spoke with the pharmacy they said the patient had been taking eliquis for 30 days, and wants to know if this is what he was supposed to be taking. The pharmacist isn't comfortable filling any medications for him without clarification from you. Eliquis was last filled 4/23 and plavix 6/23.  Natasha with the office of Dr. Lovena Neighbours and Dr. Esmeralda Links (his PCP)- [phone]

## 2022-12-27 DIAGNOSIS — N186 End stage renal disease: Secondary | ICD-10-CM | POA: Diagnosis not present

## 2022-12-27 DIAGNOSIS — I700 Atherosclerosis of aorta: Secondary | ICD-10-CM | POA: Diagnosis not present

## 2022-12-27 DIAGNOSIS — E1151 Type 2 diabetes mellitus with diabetic peripheral angiopathy without gangrene: Secondary | ICD-10-CM | POA: Diagnosis not present

## 2022-12-27 DIAGNOSIS — D6869 Other thrombophilia: Secondary | ICD-10-CM | POA: Diagnosis not present

## 2022-12-27 DIAGNOSIS — Z0000 Encounter for general adult medical examination without abnormal findings: Secondary | ICD-10-CM | POA: Diagnosis not present

## 2022-12-27 DIAGNOSIS — I480 Paroxysmal atrial fibrillation: Secondary | ICD-10-CM | POA: Diagnosis not present

## 2022-12-27 DIAGNOSIS — I2510 Atherosclerotic heart disease of native coronary artery without angina pectoris: Secondary | ICD-10-CM | POA: Diagnosis not present

## 2022-12-27 DIAGNOSIS — E1122 Type 2 diabetes mellitus with diabetic chronic kidney disease: Secondary | ICD-10-CM | POA: Diagnosis not present

## 2022-12-27 DIAGNOSIS — E1140 Type 2 diabetes mellitus with diabetic neuropathy, unspecified: Secondary | ICD-10-CM | POA: Diagnosis not present

## 2022-12-27 DIAGNOSIS — E1129 Type 2 diabetes mellitus with other diabetic kidney complication: Secondary | ICD-10-CM | POA: Diagnosis not present

## 2022-12-27 DIAGNOSIS — Z992 Dependence on renal dialysis: Secondary | ICD-10-CM | POA: Diagnosis not present

## 2022-12-28 DIAGNOSIS — E1129 Type 2 diabetes mellitus with other diabetic kidney complication: Secondary | ICD-10-CM | POA: Diagnosis not present

## 2022-12-28 DIAGNOSIS — D689 Coagulation defect, unspecified: Secondary | ICD-10-CM | POA: Diagnosis not present

## 2022-12-28 DIAGNOSIS — N186 End stage renal disease: Secondary | ICD-10-CM | POA: Diagnosis not present

## 2022-12-28 DIAGNOSIS — D631 Anemia in chronic kidney disease: Secondary | ICD-10-CM | POA: Diagnosis not present

## 2022-12-28 DIAGNOSIS — Z992 Dependence on renal dialysis: Secondary | ICD-10-CM | POA: Diagnosis not present

## 2022-12-28 DIAGNOSIS — D509 Iron deficiency anemia, unspecified: Secondary | ICD-10-CM | POA: Diagnosis not present

## 2022-12-28 DIAGNOSIS — N2581 Secondary hyperparathyroidism of renal origin: Secondary | ICD-10-CM | POA: Diagnosis not present

## 2022-12-28 DIAGNOSIS — R110 Nausea: Secondary | ICD-10-CM | POA: Diagnosis not present

## 2022-12-31 DIAGNOSIS — N2581 Secondary hyperparathyroidism of renal origin: Secondary | ICD-10-CM | POA: Diagnosis not present

## 2022-12-31 DIAGNOSIS — D509 Iron deficiency anemia, unspecified: Secondary | ICD-10-CM | POA: Diagnosis not present

## 2022-12-31 DIAGNOSIS — E1129 Type 2 diabetes mellitus with other diabetic kidney complication: Secondary | ICD-10-CM | POA: Diagnosis not present

## 2022-12-31 DIAGNOSIS — Z992 Dependence on renal dialysis: Secondary | ICD-10-CM | POA: Diagnosis not present

## 2022-12-31 DIAGNOSIS — D631 Anemia in chronic kidney disease: Secondary | ICD-10-CM | POA: Diagnosis not present

## 2022-12-31 DIAGNOSIS — I4819 Other persistent atrial fibrillation: Secondary | ICD-10-CM | POA: Diagnosis not present

## 2022-12-31 DIAGNOSIS — D689 Coagulation defect, unspecified: Secondary | ICD-10-CM | POA: Diagnosis not present

## 2022-12-31 DIAGNOSIS — N186 End stage renal disease: Secondary | ICD-10-CM | POA: Diagnosis not present

## 2022-12-31 DIAGNOSIS — R110 Nausea: Secondary | ICD-10-CM | POA: Diagnosis not present

## 2023-01-02 ENCOUNTER — Other Ambulatory Visit: Payer: Self-pay

## 2023-01-02 DIAGNOSIS — D509 Iron deficiency anemia, unspecified: Secondary | ICD-10-CM | POA: Diagnosis not present

## 2023-01-02 DIAGNOSIS — D649 Anemia, unspecified: Secondary | ICD-10-CM | POA: Diagnosis not present

## 2023-01-02 DIAGNOSIS — N186 End stage renal disease: Secondary | ICD-10-CM | POA: Diagnosis not present

## 2023-01-02 DIAGNOSIS — E7800 Pure hypercholesterolemia, unspecified: Secondary | ICD-10-CM | POA: Diagnosis not present

## 2023-01-02 DIAGNOSIS — I2510 Atherosclerotic heart disease of native coronary artery without angina pectoris: Secondary | ICD-10-CM | POA: Diagnosis not present

## 2023-01-02 DIAGNOSIS — E1129 Type 2 diabetes mellitus with other diabetic kidney complication: Secondary | ICD-10-CM | POA: Diagnosis not present

## 2023-01-02 DIAGNOSIS — N2581 Secondary hyperparathyroidism of renal origin: Secondary | ICD-10-CM | POA: Diagnosis not present

## 2023-01-02 DIAGNOSIS — R110 Nausea: Secondary | ICD-10-CM | POA: Diagnosis not present

## 2023-01-02 DIAGNOSIS — Z992 Dependence on renal dialysis: Secondary | ICD-10-CM | POA: Diagnosis not present

## 2023-01-02 DIAGNOSIS — D631 Anemia in chronic kidney disease: Secondary | ICD-10-CM | POA: Diagnosis not present

## 2023-01-02 DIAGNOSIS — I10 Essential (primary) hypertension: Secondary | ICD-10-CM | POA: Diagnosis not present

## 2023-01-02 DIAGNOSIS — E1140 Type 2 diabetes mellitus with diabetic neuropathy, unspecified: Secondary | ICD-10-CM | POA: Diagnosis not present

## 2023-01-02 DIAGNOSIS — D689 Coagulation defect, unspecified: Secondary | ICD-10-CM | POA: Diagnosis not present

## 2023-01-02 DIAGNOSIS — I5032 Chronic diastolic (congestive) heart failure: Secondary | ICD-10-CM | POA: Diagnosis not present

## 2023-01-02 MED ORDER — HYDRALAZINE HCL 100 MG PO TABS
100.0000 mg | ORAL_TABLET | Freq: Three times a day (TID) | ORAL | 1 refills | Status: DC
Start: 2023-01-02 — End: 2023-06-11

## 2023-01-02 NOTE — Telephone Encounter (Signed)
Patient is scheduled for 01/10/23

## 2023-01-03 DIAGNOSIS — E1159 Type 2 diabetes mellitus with other circulatory complications: Secondary | ICD-10-CM | POA: Diagnosis not present

## 2023-01-04 DIAGNOSIS — N2581 Secondary hyperparathyroidism of renal origin: Secondary | ICD-10-CM | POA: Diagnosis not present

## 2023-01-04 DIAGNOSIS — E1129 Type 2 diabetes mellitus with other diabetic kidney complication: Secondary | ICD-10-CM | POA: Diagnosis not present

## 2023-01-04 DIAGNOSIS — D631 Anemia in chronic kidney disease: Secondary | ICD-10-CM | POA: Diagnosis not present

## 2023-01-04 DIAGNOSIS — D689 Coagulation defect, unspecified: Secondary | ICD-10-CM | POA: Diagnosis not present

## 2023-01-04 DIAGNOSIS — D509 Iron deficiency anemia, unspecified: Secondary | ICD-10-CM | POA: Diagnosis not present

## 2023-01-04 DIAGNOSIS — Z992 Dependence on renal dialysis: Secondary | ICD-10-CM | POA: Diagnosis not present

## 2023-01-04 DIAGNOSIS — R110 Nausea: Secondary | ICD-10-CM | POA: Diagnosis not present

## 2023-01-04 DIAGNOSIS — N186 End stage renal disease: Secondary | ICD-10-CM | POA: Diagnosis not present

## 2023-01-07 DIAGNOSIS — D631 Anemia in chronic kidney disease: Secondary | ICD-10-CM | POA: Diagnosis not present

## 2023-01-07 DIAGNOSIS — N186 End stage renal disease: Secondary | ICD-10-CM | POA: Diagnosis not present

## 2023-01-07 DIAGNOSIS — D689 Coagulation defect, unspecified: Secondary | ICD-10-CM | POA: Diagnosis not present

## 2023-01-07 DIAGNOSIS — D509 Iron deficiency anemia, unspecified: Secondary | ICD-10-CM | POA: Diagnosis not present

## 2023-01-07 DIAGNOSIS — Z992 Dependence on renal dialysis: Secondary | ICD-10-CM | POA: Diagnosis not present

## 2023-01-07 DIAGNOSIS — N2581 Secondary hyperparathyroidism of renal origin: Secondary | ICD-10-CM | POA: Diagnosis not present

## 2023-01-07 DIAGNOSIS — E1129 Type 2 diabetes mellitus with other diabetic kidney complication: Secondary | ICD-10-CM | POA: Diagnosis not present

## 2023-01-07 DIAGNOSIS — R110 Nausea: Secondary | ICD-10-CM | POA: Diagnosis not present

## 2023-01-09 DIAGNOSIS — D631 Anemia in chronic kidney disease: Secondary | ICD-10-CM | POA: Diagnosis not present

## 2023-01-09 DIAGNOSIS — Z992 Dependence on renal dialysis: Secondary | ICD-10-CM | POA: Diagnosis not present

## 2023-01-09 DIAGNOSIS — R110 Nausea: Secondary | ICD-10-CM | POA: Diagnosis not present

## 2023-01-09 DIAGNOSIS — N2581 Secondary hyperparathyroidism of renal origin: Secondary | ICD-10-CM | POA: Diagnosis not present

## 2023-01-09 DIAGNOSIS — E1129 Type 2 diabetes mellitus with other diabetic kidney complication: Secondary | ICD-10-CM | POA: Diagnosis not present

## 2023-01-09 DIAGNOSIS — N186 End stage renal disease: Secondary | ICD-10-CM | POA: Diagnosis not present

## 2023-01-09 DIAGNOSIS — D509 Iron deficiency anemia, unspecified: Secondary | ICD-10-CM | POA: Diagnosis not present

## 2023-01-09 DIAGNOSIS — D689 Coagulation defect, unspecified: Secondary | ICD-10-CM | POA: Diagnosis not present

## 2023-01-10 ENCOUNTER — Encounter: Payer: Self-pay | Admitting: Cardiology

## 2023-01-10 ENCOUNTER — Ambulatory Visit: Payer: Medicare Other | Admitting: Cardiology

## 2023-01-10 ENCOUNTER — Telehealth: Payer: Self-pay

## 2023-01-10 VITALS — BP 158/77 | HR 68 | Resp 16 | Ht 75.0 in | Wt 181.0 lb

## 2023-01-10 DIAGNOSIS — I739 Peripheral vascular disease, unspecified: Secondary | ICD-10-CM

## 2023-01-10 DIAGNOSIS — I10 Essential (primary) hypertension: Secondary | ICD-10-CM

## 2023-01-10 DIAGNOSIS — I2510 Atherosclerotic heart disease of native coronary artery without angina pectoris: Secondary | ICD-10-CM | POA: Diagnosis not present

## 2023-01-10 DIAGNOSIS — I350 Nonrheumatic aortic (valve) stenosis: Secondary | ICD-10-CM

## 2023-01-10 DIAGNOSIS — I480 Paroxysmal atrial fibrillation: Secondary | ICD-10-CM | POA: Diagnosis not present

## 2023-01-10 MED ORDER — ASPIRIN 81 MG PO TBEC: 81.0000 mg | DELAYED_RELEASE_TABLET | Freq: Every day | ORAL | 3 refills | Status: DC

## 2023-01-10 MED ORDER — APIXABAN 5 MG PO TABS: 5.0000 mg | ORAL_TABLET | Freq: Two times a day (BID) | ORAL | 3 refills | Status: DC

## 2023-01-10 NOTE — Progress Notes (Signed)
Patient is here for follow up visit.  Subjective:   William Webb [facility], male    DOB: 1951-09-01, 72 y.o.   MRN: [id number]   Chief Complaint  Patient presents with   PAD (peripheral artery disease) ([place])   Follow-up    3 month   HPI  72 y.o. 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 Patient has noted in previous EHR notes to not be taking all of his medications. On 12/19/22 had spokenw with patient about increasing his losartan '50mg'$  daily to '100mg'$  daily.   Patient only has 7 days of BP readings this month.   Systolic Blood Pressure      mmHg  --          158.3 (Q000111Q - 123456) Diastolic Blood Pressure     mmHg  --          82.5 (66.0 - 91.0) Heart Rate      bpm     --          71.8 (65.0 - 80.0)   01/08/23 11:23 AM                   167      /           86        mmHg  73        bpm      01/04/23 8:19 AM                       157      /           86        mmHg  71        bpm      01/03/23 1:26 PM                       167      /           86        mmHg  74        bpm      01/02/23 9:10 AM                       153      /           86        mmHg  65        bpm      01/02/23 9:08 AM                       157      /           90        mmHg  65        bpm      01/01/23 2:42 PM                       142      /           66        mmHg  68        bpm      12/30/22 7:49 AM                       158      /           87        mmHg  71        bpm      12/28/22 8:41 AM                       171      /           91        mmHg  66        bpm      12/28/22 8:40 AM                       165      /           85        mmHg  65        bpm       He has not taken Losartan today.     Current Outpatient Medications:     acetaminophen (TYLENOL) 650 MG CR tablet, Take 1,300 mg by mouth at bedtime., Disp: , Rfl:    Alcohol Swabs (B-D SINGLE USE SWABS REGULAR) PADS, , Disp: , Rfl:    apixaban (ELIQUIS) 5 MG TABS tablet, Take 1 tablet (5 mg total) by mouth 2 (two) times daily., Disp: 180 tablet, Rfl: 3   atorvastatin (LIPITOR) 80 MG tablet, Take 1 tablet (80 mg total) by mouth daily., Disp: 30 tablet, Rfl: 1   B Complex-C-Zn-Folic Acid (DIALYVITE Q000111Q WITH ZINC) 0.8 MG TABS, SMARTSIG:1 Tablet(s) By Mouth Every Evening, Disp: , Rfl:    Blood Glucose Monitoring Suppl (ACCU-CHEK GUIDE ME) w/Device KIT, 1 Device by Does not apply route 4 (four) times daily -  before meals and at bedtime., Disp: 1 kit, Rfl: 0   Calcium Carbonate-Simethicone (ALKA-SELTZER HEARTBURN + GAS) 750-80 MG CHEW, Chew 1 tablet by mouth 3 (three) times daily as needed (indigestion/heartburn.)., Disp: , Rfl:    carvedilol (COREG) 25 MG tablet, Take 25 mg by mouth 2 (two) times daily., Disp: , Rfl:    clopidogrel (PLAVIX) 75 MG tablet, Take 1 tablet (75 mg total) by mouth daily., Disp: 90 tablet, Rfl: 3   Continuous Blood Gluc Sensor (DEXCOM G6 SENSOR) MISC, 1 Device by Does not apply route See admin instructions. Change every 10 days, Disp: 9 each, Rfl: 3   Continuous Blood Gluc Sensor (DEXCOM G6 SENSOR) MISC, 1 Device by Does not apply route See admin instructions. Change every 10 days, Disp: 9 each, Rfl: 3   Cyanocobalamin (VITAMIN B-12) 5000 MCG TBDP, Take 5,000 mcg by mouth in the morning., Disp: , Rfl:    Dextromethorphan-guaiFENesin (CORICIDIN HBP CONGESTION/COUGH) 10-200 MG CAPS, Take 2 capsules by mouth daily as needed (cough/congestion)., Disp: , Rfl:    furosemide (LASIX) 80 MG tablet, Take 80 mg by mouth See admin instructions. Take 80 mg by mouth twice a day and additional 80 mg before bedtime as needed for swelling of the ankles, Disp: , Rfl:    gabapentin (NEURONTIN) 600 MG tablet, Take 0.5 tablets (300 mg total) by mouth 2 (two) times daily.,  Disp: 60 tablet, Rfl: 0   GEBAUERS  PAIN EASE AERO, Apply topically., Disp: , Rfl:    glucose 4 GM chewable tablet, Chew 1 tablet by mouth as needed for low blood sugar., Disp: , Rfl:    hydrALAZINE (APRESOLINE) 100 MG tablet, Take 1 tablet (100 mg total) by mouth 3 (three) times daily., Disp: 270 tablet, Rfl: 1   isosorbide mononitrate (IMDUR) 60 MG 24 hr tablet, Take 1 tablet (60 mg total) by mouth daily. (Patient taking differently: Take 60 mg by mouth at bedtime.), Disp: 30 tablet, Rfl: 0   ketorolac (ACULAR) 0.5 % ophthalmic solution, Place 1 drop into both eyes daily as needed (inflamation)., Disp: , Rfl:    Lidocaine 4 % AERO, Apply 1 spray topically as needed (prior to port being accessed.)., Disp: , Rfl:    lidocaine-prilocaine (EMLA) cream, 1 application as needed (prior to port being accessed)., Disp: , Rfl:    loratadine (CLARITIN) 10 MG tablet, Take 10 mg by mouth daily as needed for allergies., Disp: , Rfl:    losartan (COZAAR) 100 MG tablet, Take 1 tablet (100 mg total) by mouth daily., Disp: 90 tablet, Rfl: 0   NOVOLOG FLEXPEN 100 UNIT/ML FlexPen, INJECT 3 UNITS INTO THE SKIN 3 (THREE) TIMES DAILY WITH MEALS., Disp: 15 mL, Rfl: 11   ondansetron (ZOFRAN) 4 MG tablet, Take 1 tablet (4 mg total) by mouth every 8 (eight) hours as needed for nausea., Disp: 20 tablet, Rfl: 0   pantoprazole (PROTONIX) 40 MG tablet, Take 1 tablet (40 mg total) by mouth daily., Disp: 30 tablet, Rfl: 0   rosuvastatin (CRESTOR) 10 MG tablet, Take 10 mg by mouth daily., Disp: , Rfl:    sevelamer carbonate (RENVELA) 800 MG tablet, Take 800 mg by mouth with breakfast, with lunch, and with evening meal., Disp: , Rfl:    tamsulosin (FLOMAX) 0.4 MG CAPS capsule, Take 0.4 mg by mouth at bedtime., Disp: , Rfl:    traMADol (ULTRAM) 50 MG tablet, Take 1 tablet (50 mg total) by mouth every 12 (twelve) hours as needed for up to 8 doses., Disp: 8 tablet, Rfl: 0   VELPHORO 500 MG chewable tablet, Chew 500 mg by mouth 3  (three) times daily., Disp: , Rfl:    vitamin E 1000 UNIT capsule, Take 1,000 Units by mouth daily., Disp: , Rfl:   Cardiovascular studies:  EKG 09/27/2022: Sinus rhythm 64 bpm  LVH Nonspecific T-abnormality  LEA duplex 03/17/2022: Right: 50-74% stenosis noted in the superficial femoral artery. 30-49%  stenosis noted in the peroneal artery. Diffuse calcific plaque noted  throughout with turbulent flow. 50-74% mid SFA stenosis, and 30-49% distal  peroneal stenosis. Collateral flow  noted at proximal ATA. Multiple vessels not well visualized secondary to  patient's constant leg motion and squeezing/trapping of technologist's  hand.   Left: Diffuse calcific plaque noted throughout with turbulent flow. 30-49%  proximal SFA stenosis, 50-74% mid SFA stenosis, and 30-49% stenosis noted  distal ATA and mid PTA. Multiple vessels not well visualized secondary to  patient's constant leg motion  and squeezing/trapping of technologist's hand.    PV intervention 03/14/2022: Rt TP trunk 80% stenosis Rt mid common peroneal artery 95% stenosis Prox occlusion of Rt ATA, Rt PT   Successful orbital atherectomy and PTA 3.0X60 mm balloon to Rt TP trunk and Rt mid common peroneal artery with 0% residual stenosis   Expect improvement in foot circulation and healing post amputation at the level of toe or transmetatarsal amputation. If difficulty healing noted, could then consider revascularization to Rt  ATA with retrograde access to Rt DP.   Echocardiogram 03/15/2022: 1. Left ventricular ejection fraction, by estimation, is 55 to 60%. The  left ventricle has normal function. The left ventricle has no regional  wall motion abnormalities. There is moderate left ventricular hypertrophy.  Left ventricular diastolic  parameters are consistent with Grade II diastolic dysfunction  (pseudonormalization). Elevated left atrial pressure.   2. Right ventricular systolic function is normal. The right ventricular   size is normal.   3. Left atrial size was severely dilated.   4. Right atrial size was moderately dilated.   5. A small pericardial effusion is present. The pericardial effusion is  posterior to the left ventricle.   6. The mitral valve is normal in structure. Mild mitral valve  regurgitation. No evidence of mitral stenosis.   7. The aortic valve is calcified. Aortic valve regurgitation is mild.  Aortic valve area, by VTI measures 1.21 cm. Aortic valve mean gradient  measures 17.0 mmHg. Aortic valve Vmax measures 2.62 m/s.   8. The inferior vena cava is normal in size with greater than 50%  respiratory variability, suggesting right atrial pressure of 3 mmHg.   Comparison(s): A prior study was performed on 09/15/2020. No significant  change from prior study.   Coronary angiogram 10/20/2018: LM: Normal LAD: Distal 70% diffuse disease.        Ostial 90% stenosis in Diag1. Patent prox Diag 1 stent LCx: Large OM1 with patent prior stent RCA: 100% occluded RPDA        Partially successful PTCA attempt        100%--99% stenosis        TIMI flow 0-I  LVEDP 16 mmHg  Echocardiogram 10/17/2018: Left ventricle: The cavity size was normal. Wall thickness was increased in a pattern of severe LVH. Systolic function was normal. The estimated ejection fraction was in the range of 50% to 55%. Wall motion was normal; there were no regional wall motion abnormalities. Doppler parameters are consistent with anormal left ventricular relaxation (grade 1 diastolic dysfunction). Doppler parameters are consistent with high ventricular filling pressure. - Left atrium: The atrium was moderately dilated. - Right atrium: The atrium was mildly dilated.   Impressions: - Low normal to mildly reduced LV systolic function; EF 50; severe LVH; mild diastolic dysfunction; biatrial enlargement.  Recent Labs: 03/19/2022: Glucose 141, BUN/Cr 33/7.22. EGFR 7. Na/K 133/4.0. Albumin 2.5. Phos 4.9. Rest of the CMP  normal H/H 9.7/29.6. MCV 91. Platelets 159  01/19/2021: Hb 11.1 HbA1C 6.3% TSH 2.2  Review of Systems  Cardiovascular:  Negative for chest pain, dyspnea on exertion, leg swelling, palpitations and syncope.        Objective:    Vitals:   01/10/23 1414 01/10/23 1419  BP: (!) 160/85 (!) 158/77  Pulse: 69 68  Resp: 16   SpO2: 96% 96%   Physical Exam Vitals and nursing note reviewed.  Constitutional:      General: He is not in acute distress. Neck:     Vascular: No JVD.  Cardiovascular:     Rate and Rhythm: Normal rate and regular rhythm.     Pulses:          Dorsalis pedis pulses are 0 on the right side and 0 on the left side.       Posterior tibial pulses are 1+ on the right side and 0 on the left side.     Heart sounds: Normal heart sounds. No murmur heard.    Comments: 2nd toe  apmputation. Healing well Pulmonary:     Effort: Pulmonary effort is normal.     Breath sounds: Normal breath sounds. No wheezing or rales.  Musculoskeletal:     Right lower leg: No edema.     Left lower leg: No edema.        Assessment & Recommendations:   72 y.o. 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 Emphasized compliance. Continue remote patient monitoring through pur pharmacist Haynes Dage.  PAD: S/p revascularization to TP trunk, peroneal revascularization (02/2022). Changed plavix to Aspirin 81 mg daily.  Atrial fibrillation: H/o persistent, now in sinus rhythm s/p cardioversion 09/29/2019. CHA2DS2VASc score 4, annual stroke risk 5%. Continue eliquis 5 mg bid.   CAD: CAD s/p prior PCI, NSTEMI in 09/2018, attempted but unsuccessful. PTCA to chronically occluded Rt PDA Stable without angina symptoms.   F/u in 6 months   Nigel Mormon,  MD Pager: [phone] Office: [phone]

## 2023-01-10 NOTE — Telephone Encounter (Signed)
Patient home blood pressure is not well controlled on current antihypertensive regimen. Patient has noted in previous EHR notes to not be taking all of his medications. On 12/19/22 had spokenw with patient about increasing his losartan '50mg'$  daily to '100mg'$  daily.  Patient only has 7 days of BP readings this month.  Systolic Blood Pressure mmHg -- 158.3 (Q000111Q - 123456) Diastolic Blood Pressure mmHg -- 82.5 (66.0 - 91.0) Heart Rate bpm -- 71.8 (65.0 - 80.0)  01/08/23 11:23 AM  167 / 86 mmHg 73 bpm  01/04/23 8:19 AM  157 / 86 mmHg 71 bpm  01/03/23 1:26 PM  167 / 86 mmHg 74 bpm  01/02/23 9:10 AM  153 / 86 mmHg 65 bpm  01/02/23 9:08 AM  157 / 90 mmHg 65 bpm  01/01/23 2:42 PM  142 / 66 mmHg 68 bpm  12/30/22 7:49 AM  158 / 87 mmHg 71 bpm  12/28/22 8:41 AM  171 / 91 mmHg 66 bpm  12/28/22 8:40 AM  165 / 85 mmHg 65 bpm

## 2023-01-11 DIAGNOSIS — D631 Anemia in chronic kidney disease: Secondary | ICD-10-CM | POA: Diagnosis not present

## 2023-01-11 DIAGNOSIS — N186 End stage renal disease: Secondary | ICD-10-CM | POA: Diagnosis not present

## 2023-01-11 DIAGNOSIS — R110 Nausea: Secondary | ICD-10-CM | POA: Diagnosis not present

## 2023-01-11 DIAGNOSIS — E1129 Type 2 diabetes mellitus with other diabetic kidney complication: Secondary | ICD-10-CM | POA: Diagnosis not present

## 2023-01-11 DIAGNOSIS — D509 Iron deficiency anemia, unspecified: Secondary | ICD-10-CM | POA: Diagnosis not present

## 2023-01-11 DIAGNOSIS — D689 Coagulation defect, unspecified: Secondary | ICD-10-CM | POA: Diagnosis not present

## 2023-01-11 DIAGNOSIS — N2581 Secondary hyperparathyroidism of renal origin: Secondary | ICD-10-CM | POA: Diagnosis not present

## 2023-01-11 DIAGNOSIS — Z992 Dependence on renal dialysis: Secondary | ICD-10-CM | POA: Diagnosis not present

## 2023-01-14 DIAGNOSIS — E1129 Type 2 diabetes mellitus with other diabetic kidney complication: Secondary | ICD-10-CM | POA: Diagnosis not present

## 2023-01-14 DIAGNOSIS — N186 End stage renal disease: Secondary | ICD-10-CM | POA: Diagnosis not present

## 2023-01-14 DIAGNOSIS — D509 Iron deficiency anemia, unspecified: Secondary | ICD-10-CM | POA: Diagnosis not present

## 2023-01-14 DIAGNOSIS — R110 Nausea: Secondary | ICD-10-CM | POA: Diagnosis not present

## 2023-01-14 DIAGNOSIS — D631 Anemia in chronic kidney disease: Secondary | ICD-10-CM | POA: Diagnosis not present

## 2023-01-14 DIAGNOSIS — N2581 Secondary hyperparathyroidism of renal origin: Secondary | ICD-10-CM | POA: Diagnosis not present

## 2023-01-14 DIAGNOSIS — J9690 Respiratory failure, unspecified, unspecified whether with hypoxia or hypercapnia: Secondary | ICD-10-CM | POA: Diagnosis not present

## 2023-01-14 DIAGNOSIS — D689 Coagulation defect, unspecified: Secondary | ICD-10-CM | POA: Diagnosis not present

## 2023-01-14 DIAGNOSIS — Z992 Dependence on renal dialysis: Secondary | ICD-10-CM | POA: Diagnosis not present

## 2023-01-16 DIAGNOSIS — N186 End stage renal disease: Secondary | ICD-10-CM | POA: Diagnosis not present

## 2023-01-16 DIAGNOSIS — E1129 Type 2 diabetes mellitus with other diabetic kidney complication: Secondary | ICD-10-CM | POA: Diagnosis not present

## 2023-01-16 DIAGNOSIS — N2581 Secondary hyperparathyroidism of renal origin: Secondary | ICD-10-CM | POA: Diagnosis not present

## 2023-01-16 DIAGNOSIS — D689 Coagulation defect, unspecified: Secondary | ICD-10-CM | POA: Diagnosis not present

## 2023-01-16 DIAGNOSIS — D631 Anemia in chronic kidney disease: Secondary | ICD-10-CM | POA: Diagnosis not present

## 2023-01-16 DIAGNOSIS — R110 Nausea: Secondary | ICD-10-CM | POA: Diagnosis not present

## 2023-01-16 DIAGNOSIS — D509 Iron deficiency anemia, unspecified: Secondary | ICD-10-CM | POA: Diagnosis not present

## 2023-01-16 DIAGNOSIS — Z992 Dependence on renal dialysis: Secondary | ICD-10-CM | POA: Diagnosis not present

## 2023-01-18 DIAGNOSIS — E1129 Type 2 diabetes mellitus with other diabetic kidney complication: Secondary | ICD-10-CM | POA: Diagnosis not present

## 2023-01-18 DIAGNOSIS — D631 Anemia in chronic kidney disease: Secondary | ICD-10-CM | POA: Diagnosis not present

## 2023-01-18 DIAGNOSIS — D509 Iron deficiency anemia, unspecified: Secondary | ICD-10-CM | POA: Diagnosis not present

## 2023-01-18 DIAGNOSIS — N186 End stage renal disease: Secondary | ICD-10-CM | POA: Diagnosis not present

## 2023-01-18 DIAGNOSIS — Z992 Dependence on renal dialysis: Secondary | ICD-10-CM | POA: Diagnosis not present

## 2023-01-18 DIAGNOSIS — N2581 Secondary hyperparathyroidism of renal origin: Secondary | ICD-10-CM | POA: Diagnosis not present

## 2023-01-18 DIAGNOSIS — R110 Nausea: Secondary | ICD-10-CM | POA: Diagnosis not present

## 2023-01-18 DIAGNOSIS — D689 Coagulation defect, unspecified: Secondary | ICD-10-CM | POA: Diagnosis not present

## 2023-01-21 DIAGNOSIS — D509 Iron deficiency anemia, unspecified: Secondary | ICD-10-CM | POA: Diagnosis not present

## 2023-01-21 DIAGNOSIS — D631 Anemia in chronic kidney disease: Secondary | ICD-10-CM | POA: Diagnosis not present

## 2023-01-21 DIAGNOSIS — Z992 Dependence on renal dialysis: Secondary | ICD-10-CM | POA: Diagnosis not present

## 2023-01-21 DIAGNOSIS — D689 Coagulation defect, unspecified: Secondary | ICD-10-CM | POA: Diagnosis not present

## 2023-01-21 DIAGNOSIS — E1129 Type 2 diabetes mellitus with other diabetic kidney complication: Secondary | ICD-10-CM | POA: Diagnosis not present

## 2023-01-21 DIAGNOSIS — N186 End stage renal disease: Secondary | ICD-10-CM | POA: Diagnosis not present

## 2023-01-21 DIAGNOSIS — N2581 Secondary hyperparathyroidism of renal origin: Secondary | ICD-10-CM | POA: Diagnosis not present

## 2023-01-21 DIAGNOSIS — R110 Nausea: Secondary | ICD-10-CM | POA: Diagnosis not present

## 2023-01-23 DIAGNOSIS — D689 Coagulation defect, unspecified: Secondary | ICD-10-CM | POA: Diagnosis not present

## 2023-01-23 DIAGNOSIS — R110 Nausea: Secondary | ICD-10-CM | POA: Diagnosis not present

## 2023-01-23 DIAGNOSIS — E1129 Type 2 diabetes mellitus with other diabetic kidney complication: Secondary | ICD-10-CM | POA: Diagnosis not present

## 2023-01-23 DIAGNOSIS — Z992 Dependence on renal dialysis: Secondary | ICD-10-CM | POA: Diagnosis not present

## 2023-01-23 DIAGNOSIS — D631 Anemia in chronic kidney disease: Secondary | ICD-10-CM | POA: Diagnosis not present

## 2023-01-23 DIAGNOSIS — N186 End stage renal disease: Secondary | ICD-10-CM | POA: Diagnosis not present

## 2023-01-23 DIAGNOSIS — N2581 Secondary hyperparathyroidism of renal origin: Secondary | ICD-10-CM | POA: Diagnosis not present

## 2023-01-23 DIAGNOSIS — D509 Iron deficiency anemia, unspecified: Secondary | ICD-10-CM | POA: Diagnosis not present

## 2023-01-25 DIAGNOSIS — D631 Anemia in chronic kidney disease: Secondary | ICD-10-CM | POA: Diagnosis not present

## 2023-01-25 DIAGNOSIS — N186 End stage renal disease: Secondary | ICD-10-CM | POA: Diagnosis not present

## 2023-01-25 DIAGNOSIS — N2581 Secondary hyperparathyroidism of renal origin: Secondary | ICD-10-CM | POA: Diagnosis not present

## 2023-01-25 DIAGNOSIS — D689 Coagulation defect, unspecified: Secondary | ICD-10-CM | POA: Diagnosis not present

## 2023-01-25 DIAGNOSIS — E1129 Type 2 diabetes mellitus with other diabetic kidney complication: Secondary | ICD-10-CM | POA: Diagnosis not present

## 2023-01-25 DIAGNOSIS — Z992 Dependence on renal dialysis: Secondary | ICD-10-CM | POA: Diagnosis not present

## 2023-01-25 DIAGNOSIS — D509 Iron deficiency anemia, unspecified: Secondary | ICD-10-CM | POA: Diagnosis not present

## 2023-01-25 DIAGNOSIS — R110 Nausea: Secondary | ICD-10-CM | POA: Diagnosis not present

## 2023-01-27 DIAGNOSIS — Z992 Dependence on renal dialysis: Secondary | ICD-10-CM | POA: Diagnosis not present

## 2023-01-27 DIAGNOSIS — E1129 Type 2 diabetes mellitus with other diabetic kidney complication: Secondary | ICD-10-CM | POA: Diagnosis not present

## 2023-01-27 DIAGNOSIS — N186 End stage renal disease: Secondary | ICD-10-CM | POA: Diagnosis not present

## 2023-01-28 DIAGNOSIS — D689 Coagulation defect, unspecified: Secondary | ICD-10-CM | POA: Diagnosis not present

## 2023-01-28 DIAGNOSIS — D631 Anemia in chronic kidney disease: Secondary | ICD-10-CM | POA: Diagnosis not present

## 2023-01-28 DIAGNOSIS — R197 Diarrhea, unspecified: Secondary | ICD-10-CM | POA: Diagnosis not present

## 2023-01-28 DIAGNOSIS — D509 Iron deficiency anemia, unspecified: Secondary | ICD-10-CM | POA: Diagnosis not present

## 2023-01-28 DIAGNOSIS — E1129 Type 2 diabetes mellitus with other diabetic kidney complication: Secondary | ICD-10-CM | POA: Diagnosis not present

## 2023-01-28 DIAGNOSIS — R110 Nausea: Secondary | ICD-10-CM | POA: Diagnosis not present

## 2023-01-28 DIAGNOSIS — R52 Pain, unspecified: Secondary | ICD-10-CM | POA: Diagnosis not present

## 2023-01-28 DIAGNOSIS — N2581 Secondary hyperparathyroidism of renal origin: Secondary | ICD-10-CM | POA: Diagnosis not present

## 2023-01-28 DIAGNOSIS — N186 End stage renal disease: Secondary | ICD-10-CM | POA: Diagnosis not present

## 2023-01-28 DIAGNOSIS — Z992 Dependence on renal dialysis: Secondary | ICD-10-CM | POA: Diagnosis not present

## 2023-01-30 DIAGNOSIS — R110 Nausea: Secondary | ICD-10-CM | POA: Diagnosis not present

## 2023-01-30 DIAGNOSIS — R197 Diarrhea, unspecified: Secondary | ICD-10-CM | POA: Diagnosis not present

## 2023-01-30 DIAGNOSIS — R52 Pain, unspecified: Secondary | ICD-10-CM | POA: Diagnosis not present

## 2023-01-30 DIAGNOSIS — E1129 Type 2 diabetes mellitus with other diabetic kidney complication: Secondary | ICD-10-CM | POA: Diagnosis not present

## 2023-01-30 DIAGNOSIS — D509 Iron deficiency anemia, unspecified: Secondary | ICD-10-CM | POA: Diagnosis not present

## 2023-01-30 DIAGNOSIS — D689 Coagulation defect, unspecified: Secondary | ICD-10-CM | POA: Diagnosis not present

## 2023-01-30 DIAGNOSIS — D631 Anemia in chronic kidney disease: Secondary | ICD-10-CM | POA: Diagnosis not present

## 2023-01-30 DIAGNOSIS — N2581 Secondary hyperparathyroidism of renal origin: Secondary | ICD-10-CM | POA: Diagnosis not present

## 2023-01-30 DIAGNOSIS — Z992 Dependence on renal dialysis: Secondary | ICD-10-CM | POA: Diagnosis not present

## 2023-01-30 DIAGNOSIS — N186 End stage renal disease: Secondary | ICD-10-CM | POA: Diagnosis not present

## 2023-01-31 DIAGNOSIS — I4819 Other persistent atrial fibrillation: Secondary | ICD-10-CM | POA: Diagnosis not present

## 2023-02-01 DIAGNOSIS — R110 Nausea: Secondary | ICD-10-CM | POA: Diagnosis not present

## 2023-02-01 DIAGNOSIS — D689 Coagulation defect, unspecified: Secondary | ICD-10-CM | POA: Diagnosis not present

## 2023-02-01 DIAGNOSIS — Z992 Dependence on renal dialysis: Secondary | ICD-10-CM | POA: Diagnosis not present

## 2023-02-01 DIAGNOSIS — R197 Diarrhea, unspecified: Secondary | ICD-10-CM | POA: Diagnosis not present

## 2023-02-01 DIAGNOSIS — N2581 Secondary hyperparathyroidism of renal origin: Secondary | ICD-10-CM | POA: Diagnosis not present

## 2023-02-01 DIAGNOSIS — D509 Iron deficiency anemia, unspecified: Secondary | ICD-10-CM | POA: Diagnosis not present

## 2023-02-01 DIAGNOSIS — E1129 Type 2 diabetes mellitus with other diabetic kidney complication: Secondary | ICD-10-CM | POA: Diagnosis not present

## 2023-02-01 DIAGNOSIS — R52 Pain, unspecified: Secondary | ICD-10-CM | POA: Diagnosis not present

## 2023-02-01 DIAGNOSIS — N186 End stage renal disease: Secondary | ICD-10-CM | POA: Diagnosis not present

## 2023-02-01 DIAGNOSIS — D631 Anemia in chronic kidney disease: Secondary | ICD-10-CM | POA: Diagnosis not present

## 2023-02-02 DIAGNOSIS — E1159 Type 2 diabetes mellitus with other circulatory complications: Secondary | ICD-10-CM | POA: Diagnosis not present

## 2023-02-04 DIAGNOSIS — D689 Coagulation defect, unspecified: Secondary | ICD-10-CM | POA: Diagnosis not present

## 2023-02-04 DIAGNOSIS — R110 Nausea: Secondary | ICD-10-CM | POA: Diagnosis not present

## 2023-02-04 DIAGNOSIS — R52 Pain, unspecified: Secondary | ICD-10-CM | POA: Diagnosis not present

## 2023-02-04 DIAGNOSIS — D631 Anemia in chronic kidney disease: Secondary | ICD-10-CM | POA: Diagnosis not present

## 2023-02-04 DIAGNOSIS — D509 Iron deficiency anemia, unspecified: Secondary | ICD-10-CM | POA: Diagnosis not present

## 2023-02-04 DIAGNOSIS — R197 Diarrhea, unspecified: Secondary | ICD-10-CM | POA: Diagnosis not present

## 2023-02-04 DIAGNOSIS — Z992 Dependence on renal dialysis: Secondary | ICD-10-CM | POA: Diagnosis not present

## 2023-02-04 DIAGNOSIS — N2581 Secondary hyperparathyroidism of renal origin: Secondary | ICD-10-CM | POA: Diagnosis not present

## 2023-02-04 DIAGNOSIS — N186 End stage renal disease: Secondary | ICD-10-CM | POA: Diagnosis not present

## 2023-02-04 DIAGNOSIS — E1129 Type 2 diabetes mellitus with other diabetic kidney complication: Secondary | ICD-10-CM | POA: Diagnosis not present

## 2023-02-06 DIAGNOSIS — N2581 Secondary hyperparathyroidism of renal origin: Secondary | ICD-10-CM | POA: Diagnosis not present

## 2023-02-06 DIAGNOSIS — D689 Coagulation defect, unspecified: Secondary | ICD-10-CM | POA: Diagnosis not present

## 2023-02-06 DIAGNOSIS — D631 Anemia in chronic kidney disease: Secondary | ICD-10-CM | POA: Diagnosis not present

## 2023-02-06 DIAGNOSIS — D509 Iron deficiency anemia, unspecified: Secondary | ICD-10-CM | POA: Diagnosis not present

## 2023-02-06 DIAGNOSIS — R110 Nausea: Secondary | ICD-10-CM | POA: Diagnosis not present

## 2023-02-06 DIAGNOSIS — R197 Diarrhea, unspecified: Secondary | ICD-10-CM | POA: Diagnosis not present

## 2023-02-06 DIAGNOSIS — E1129 Type 2 diabetes mellitus with other diabetic kidney complication: Secondary | ICD-10-CM | POA: Diagnosis not present

## 2023-02-06 DIAGNOSIS — N186 End stage renal disease: Secondary | ICD-10-CM | POA: Diagnosis not present

## 2023-02-06 DIAGNOSIS — R52 Pain, unspecified: Secondary | ICD-10-CM | POA: Diagnosis not present

## 2023-02-06 DIAGNOSIS — Z992 Dependence on renal dialysis: Secondary | ICD-10-CM | POA: Diagnosis not present

## 2023-02-07 DIAGNOSIS — C678 Malignant neoplasm of overlapping sites of bladder: Secondary | ICD-10-CM | POA: Diagnosis not present

## 2023-02-08 DIAGNOSIS — D689 Coagulation defect, unspecified: Secondary | ICD-10-CM | POA: Diagnosis not present

## 2023-02-08 DIAGNOSIS — D631 Anemia in chronic kidney disease: Secondary | ICD-10-CM | POA: Diagnosis not present

## 2023-02-08 DIAGNOSIS — N186 End stage renal disease: Secondary | ICD-10-CM | POA: Diagnosis not present

## 2023-02-08 DIAGNOSIS — D509 Iron deficiency anemia, unspecified: Secondary | ICD-10-CM | POA: Diagnosis not present

## 2023-02-08 DIAGNOSIS — Z992 Dependence on renal dialysis: Secondary | ICD-10-CM | POA: Diagnosis not present

## 2023-02-08 DIAGNOSIS — R110 Nausea: Secondary | ICD-10-CM | POA: Diagnosis not present

## 2023-02-08 DIAGNOSIS — R197 Diarrhea, unspecified: Secondary | ICD-10-CM | POA: Diagnosis not present

## 2023-02-08 DIAGNOSIS — N2581 Secondary hyperparathyroidism of renal origin: Secondary | ICD-10-CM | POA: Diagnosis not present

## 2023-02-08 DIAGNOSIS — E1129 Type 2 diabetes mellitus with other diabetic kidney complication: Secondary | ICD-10-CM | POA: Diagnosis not present

## 2023-02-08 DIAGNOSIS — R52 Pain, unspecified: Secondary | ICD-10-CM | POA: Diagnosis not present

## 2023-02-11 DIAGNOSIS — D689 Coagulation defect, unspecified: Secondary | ICD-10-CM | POA: Diagnosis not present

## 2023-02-11 DIAGNOSIS — D509 Iron deficiency anemia, unspecified: Secondary | ICD-10-CM | POA: Diagnosis not present

## 2023-02-11 DIAGNOSIS — Z992 Dependence on renal dialysis: Secondary | ICD-10-CM | POA: Diagnosis not present

## 2023-02-11 DIAGNOSIS — N2581 Secondary hyperparathyroidism of renal origin: Secondary | ICD-10-CM | POA: Diagnosis not present

## 2023-02-11 DIAGNOSIS — R52 Pain, unspecified: Secondary | ICD-10-CM | POA: Diagnosis not present

## 2023-02-11 DIAGNOSIS — D631 Anemia in chronic kidney disease: Secondary | ICD-10-CM | POA: Diagnosis not present

## 2023-02-11 DIAGNOSIS — N186 End stage renal disease: Secondary | ICD-10-CM | POA: Diagnosis not present

## 2023-02-11 DIAGNOSIS — R197 Diarrhea, unspecified: Secondary | ICD-10-CM | POA: Diagnosis not present

## 2023-02-11 DIAGNOSIS — R110 Nausea: Secondary | ICD-10-CM | POA: Diagnosis not present

## 2023-02-11 DIAGNOSIS — E1129 Type 2 diabetes mellitus with other diabetic kidney complication: Secondary | ICD-10-CM | POA: Diagnosis not present

## 2023-02-13 DIAGNOSIS — D689 Coagulation defect, unspecified: Secondary | ICD-10-CM | POA: Diagnosis not present

## 2023-02-13 DIAGNOSIS — D631 Anemia in chronic kidney disease: Secondary | ICD-10-CM | POA: Diagnosis not present

## 2023-02-13 DIAGNOSIS — Z992 Dependence on renal dialysis: Secondary | ICD-10-CM | POA: Diagnosis not present

## 2023-02-13 DIAGNOSIS — N186 End stage renal disease: Secondary | ICD-10-CM | POA: Diagnosis not present

## 2023-02-13 DIAGNOSIS — R110 Nausea: Secondary | ICD-10-CM | POA: Diagnosis not present

## 2023-02-13 DIAGNOSIS — R52 Pain, unspecified: Secondary | ICD-10-CM | POA: Diagnosis not present

## 2023-02-13 DIAGNOSIS — D509 Iron deficiency anemia, unspecified: Secondary | ICD-10-CM | POA: Diagnosis not present

## 2023-02-13 DIAGNOSIS — R197 Diarrhea, unspecified: Secondary | ICD-10-CM | POA: Diagnosis not present

## 2023-02-13 DIAGNOSIS — E1129 Type 2 diabetes mellitus with other diabetic kidney complication: Secondary | ICD-10-CM | POA: Diagnosis not present

## 2023-02-13 DIAGNOSIS — N2581 Secondary hyperparathyroidism of renal origin: Secondary | ICD-10-CM | POA: Diagnosis not present

## 2023-02-15 DIAGNOSIS — Z992 Dependence on renal dialysis: Secondary | ICD-10-CM | POA: Diagnosis not present

## 2023-02-15 DIAGNOSIS — R197 Diarrhea, unspecified: Secondary | ICD-10-CM | POA: Diagnosis not present

## 2023-02-15 DIAGNOSIS — E1129 Type 2 diabetes mellitus with other diabetic kidney complication: Secondary | ICD-10-CM | POA: Diagnosis not present

## 2023-02-15 DIAGNOSIS — D509 Iron deficiency anemia, unspecified: Secondary | ICD-10-CM | POA: Diagnosis not present

## 2023-02-15 DIAGNOSIS — R52 Pain, unspecified: Secondary | ICD-10-CM | POA: Diagnosis not present

## 2023-02-15 DIAGNOSIS — D631 Anemia in chronic kidney disease: Secondary | ICD-10-CM | POA: Diagnosis not present

## 2023-02-15 DIAGNOSIS — R110 Nausea: Secondary | ICD-10-CM | POA: Diagnosis not present

## 2023-02-15 DIAGNOSIS — N186 End stage renal disease: Secondary | ICD-10-CM | POA: Diagnosis not present

## 2023-02-15 DIAGNOSIS — D689 Coagulation defect, unspecified: Secondary | ICD-10-CM | POA: Diagnosis not present

## 2023-02-15 DIAGNOSIS — N2581 Secondary hyperparathyroidism of renal origin: Secondary | ICD-10-CM | POA: Diagnosis not present

## 2023-02-18 DIAGNOSIS — R197 Diarrhea, unspecified: Secondary | ICD-10-CM | POA: Diagnosis not present

## 2023-02-18 DIAGNOSIS — D631 Anemia in chronic kidney disease: Secondary | ICD-10-CM | POA: Diagnosis not present

## 2023-02-18 DIAGNOSIS — N2581 Secondary hyperparathyroidism of renal origin: Secondary | ICD-10-CM | POA: Diagnosis not present

## 2023-02-18 DIAGNOSIS — Z992 Dependence on renal dialysis: Secondary | ICD-10-CM | POA: Diagnosis not present

## 2023-02-18 DIAGNOSIS — D689 Coagulation defect, unspecified: Secondary | ICD-10-CM | POA: Diagnosis not present

## 2023-02-18 DIAGNOSIS — R52 Pain, unspecified: Secondary | ICD-10-CM | POA: Diagnosis not present

## 2023-02-18 DIAGNOSIS — N186 End stage renal disease: Secondary | ICD-10-CM | POA: Diagnosis not present

## 2023-02-18 DIAGNOSIS — E1129 Type 2 diabetes mellitus with other diabetic kidney complication: Secondary | ICD-10-CM | POA: Diagnosis not present

## 2023-02-18 DIAGNOSIS — R110 Nausea: Secondary | ICD-10-CM | POA: Diagnosis not present

## 2023-02-18 DIAGNOSIS — D509 Iron deficiency anemia, unspecified: Secondary | ICD-10-CM | POA: Diagnosis not present

## 2023-02-20 DIAGNOSIS — Z992 Dependence on renal dialysis: Secondary | ICD-10-CM | POA: Diagnosis not present

## 2023-02-20 DIAGNOSIS — D631 Anemia in chronic kidney disease: Secondary | ICD-10-CM | POA: Diagnosis not present

## 2023-02-20 DIAGNOSIS — N2581 Secondary hyperparathyroidism of renal origin: Secondary | ICD-10-CM | POA: Diagnosis not present

## 2023-02-20 DIAGNOSIS — R197 Diarrhea, unspecified: Secondary | ICD-10-CM | POA: Diagnosis not present

## 2023-02-20 DIAGNOSIS — R52 Pain, unspecified: Secondary | ICD-10-CM | POA: Diagnosis not present

## 2023-02-20 DIAGNOSIS — E1129 Type 2 diabetes mellitus with other diabetic kidney complication: Secondary | ICD-10-CM | POA: Diagnosis not present

## 2023-02-20 DIAGNOSIS — N186 End stage renal disease: Secondary | ICD-10-CM | POA: Diagnosis not present

## 2023-02-20 DIAGNOSIS — D509 Iron deficiency anemia, unspecified: Secondary | ICD-10-CM | POA: Diagnosis not present

## 2023-02-20 DIAGNOSIS — D689 Coagulation defect, unspecified: Secondary | ICD-10-CM | POA: Diagnosis not present

## 2023-02-20 DIAGNOSIS — R110 Nausea: Secondary | ICD-10-CM | POA: Diagnosis not present

## 2023-02-22 DIAGNOSIS — D689 Coagulation defect, unspecified: Secondary | ICD-10-CM | POA: Diagnosis not present

## 2023-02-22 DIAGNOSIS — R52 Pain, unspecified: Secondary | ICD-10-CM | POA: Diagnosis not present

## 2023-02-22 DIAGNOSIS — N2581 Secondary hyperparathyroidism of renal origin: Secondary | ICD-10-CM | POA: Diagnosis not present

## 2023-02-22 DIAGNOSIS — D509 Iron deficiency anemia, unspecified: Secondary | ICD-10-CM | POA: Diagnosis not present

## 2023-02-22 DIAGNOSIS — R110 Nausea: Secondary | ICD-10-CM | POA: Diagnosis not present

## 2023-02-22 DIAGNOSIS — D631 Anemia in chronic kidney disease: Secondary | ICD-10-CM | POA: Diagnosis not present

## 2023-02-22 DIAGNOSIS — Z992 Dependence on renal dialysis: Secondary | ICD-10-CM | POA: Diagnosis not present

## 2023-02-22 DIAGNOSIS — R197 Diarrhea, unspecified: Secondary | ICD-10-CM | POA: Diagnosis not present

## 2023-02-22 DIAGNOSIS — N186 End stage renal disease: Secondary | ICD-10-CM | POA: Diagnosis not present

## 2023-02-22 DIAGNOSIS — E1129 Type 2 diabetes mellitus with other diabetic kidney complication: Secondary | ICD-10-CM | POA: Diagnosis not present

## 2023-02-25 DIAGNOSIS — D631 Anemia in chronic kidney disease: Secondary | ICD-10-CM | POA: Diagnosis not present

## 2023-02-25 DIAGNOSIS — Z992 Dependence on renal dialysis: Secondary | ICD-10-CM | POA: Diagnosis not present

## 2023-02-25 DIAGNOSIS — R110 Nausea: Secondary | ICD-10-CM | POA: Diagnosis not present

## 2023-02-25 DIAGNOSIS — N2581 Secondary hyperparathyroidism of renal origin: Secondary | ICD-10-CM | POA: Diagnosis not present

## 2023-02-25 DIAGNOSIS — E1129 Type 2 diabetes mellitus with other diabetic kidney complication: Secondary | ICD-10-CM | POA: Diagnosis not present

## 2023-02-25 DIAGNOSIS — R52 Pain, unspecified: Secondary | ICD-10-CM | POA: Diagnosis not present

## 2023-02-25 DIAGNOSIS — R197 Diarrhea, unspecified: Secondary | ICD-10-CM | POA: Diagnosis not present

## 2023-02-25 DIAGNOSIS — N186 End stage renal disease: Secondary | ICD-10-CM | POA: Diagnosis not present

## 2023-02-25 DIAGNOSIS — D509 Iron deficiency anemia, unspecified: Secondary | ICD-10-CM | POA: Diagnosis not present

## 2023-02-25 DIAGNOSIS — D689 Coagulation defect, unspecified: Secondary | ICD-10-CM | POA: Diagnosis not present

## 2023-02-26 ENCOUNTER — Other Ambulatory Visit: Payer: Self-pay

## 2023-02-26 ENCOUNTER — Emergency Department (HOSPITAL_COMMUNITY)
Admission: EM | Admit: 2023-02-26 | Discharge: 2023-02-27 | Disposition: A | Discharge status: Home / Self Care | Payer: Medicare Other | Attending: Emergency Medicine | Admitting: Emergency Medicine

## 2023-02-26 DIAGNOSIS — Z7901 Long term (current) use of anticoagulants: Secondary | ICD-10-CM | POA: Diagnosis not present

## 2023-02-26 DIAGNOSIS — K8020 Calculus of gallbladder without cholecystitis without obstruction: Secondary | ICD-10-CM | POA: Diagnosis not present

## 2023-02-26 DIAGNOSIS — N186 End stage renal disease: Secondary | ICD-10-CM | POA: Insufficient documentation

## 2023-02-26 DIAGNOSIS — K5900 Constipation, unspecified: Secondary | ICD-10-CM | POA: Diagnosis not present

## 2023-02-26 DIAGNOSIS — Z7982 Long term (current) use of aspirin: Secondary | ICD-10-CM | POA: Insufficient documentation

## 2023-02-26 DIAGNOSIS — I120 Hypertensive chronic kidney disease with stage 5 chronic kidney disease or end stage renal disease: Secondary | ICD-10-CM | POA: Insufficient documentation

## 2023-02-26 DIAGNOSIS — R109 Unspecified abdominal pain: Secondary | ICD-10-CM | POA: Diagnosis not present

## 2023-02-26 DIAGNOSIS — Z992 Dependence on renal dialysis: Secondary | ICD-10-CM | POA: Insufficient documentation

## 2023-02-26 DIAGNOSIS — R6889 Other general symptoms and signs: Secondary | ICD-10-CM | POA: Diagnosis not present

## 2023-02-26 DIAGNOSIS — Z79899 Other long term (current) drug therapy: Secondary | ICD-10-CM | POA: Insufficient documentation

## 2023-02-26 DIAGNOSIS — I2510 Atherosclerotic heart disease of native coronary artery without angina pectoris: Secondary | ICD-10-CM | POA: Diagnosis not present

## 2023-02-26 DIAGNOSIS — E1129 Type 2 diabetes mellitus with other diabetic kidney complication: Secondary | ICD-10-CM | POA: Diagnosis not present

## 2023-02-26 DIAGNOSIS — Z743 Need for continuous supervision: Secondary | ICD-10-CM | POA: Diagnosis not present

## 2023-02-26 NOTE — ED Triage Notes (Signed)
Pt says that he has not had a bm in about a week, he is having pain in the lower abdomen, to his back to his tailbone area. Dialysis pt, m/w/f

## 2023-02-26 NOTE — ED Triage Notes (Signed)
Pt arrives from home via [facility] with c/o constipation x 1 week, has had some rectal spasms, small hard dark brown stool, lower abdominal pain 8/10, tender to palpation, feels more rigid than normal, nausea, no vomiting, took prescribed Linzess. En route 169/86, hr 90, 100% ra, cbg 125.

## 2023-02-27 ENCOUNTER — Emergency Department (HOSPITAL_COMMUNITY): Payer: Medicare Other

## 2023-02-27 DIAGNOSIS — R109 Unspecified abdominal pain: Secondary | ICD-10-CM | POA: Diagnosis not present

## 2023-02-27 DIAGNOSIS — K8020 Calculus of gallbladder without cholecystitis without obstruction: Secondary | ICD-10-CM | POA: Diagnosis not present

## 2023-02-27 LAB — COMPREHENSIVE METABOLIC PANEL
ALT: 11 U/L (ref 0–44)
AST: 17 U/L (ref 15–41)
Albumin: 4.2 g/dL (ref 3.5–5.0)
Alkaline Phosphatase: 72 U/L (ref 38–126)
Anion gap: 18 — ABNORMAL HIGH (ref 5–15)
BUN: 33 mg/dL — ABNORMAL HIGH (ref 8–23)
CO2: 26 mmol/L (ref 22–32)
Calcium: 8.9 mg/dL (ref 8.9–10.3)
Chloride: 92 mmol/L — ABNORMAL LOW (ref 98–111)
Creatinine, Ser: 7.17 mg/dL — ABNORMAL HIGH (ref 0.61–1.24)
GFR, Estimated: 7 mL/min — ABNORMAL LOW (ref >60)
Glucose, Bld: 136 mg/dL — ABNORMAL HIGH (ref 70–99)
Potassium: 3.5 mmol/L (ref 3.5–5.1)
Sodium: 136 mmol/L (ref 135–145)
Total Bilirubin: 1 mg/dL (ref 0.3–1.2)
Total Protein: 7.8 g/dL (ref 6.5–8.1)

## 2023-02-27 LAB — CBC
HCT: 32.3 % — ABNORMAL LOW (ref 39.0–52.0)
Hemoglobin: 10.9 g/dL — ABNORMAL LOW (ref 13.0–17.0)
MCH: 31.5 pg (ref 26.0–34.0)
MCHC: 33.7 g/dL (ref 30.0–36.0)
MCV: 93.4 fL (ref 80.0–100.0)
Platelets: 153 10*3/uL (ref 150–400)
RBC: 3.46 MIL/uL — ABNORMAL LOW (ref 4.22–5.81)
RDW: 14.9 % (ref 11.5–15.5)
WBC: 5.5 10*3/uL (ref 4.0–10.5)
nRBC: 0 % (ref 0.0–0.2)

## 2023-02-27 LAB — LIPASE, BLOOD: Lipase: 24 U/L (ref 11–51)

## 2023-02-27 MED ORDER — POLYETHYLENE GLYCOL 3350 17 GM/SCOOP PO POWD: 17.0000 g | Freq: Three times a day (TID) | ORAL | 0 refills | Status: DC

## 2023-02-27 MED ORDER — IOHEXOL 350 MG/ML SOLN
75.0000 mL | Freq: Once | INTRAVENOUS | Status: AC | PRN
  Administered 2023-02-27: 75 mL via INTRAVENOUS

## 2023-02-27 NOTE — ED Notes (Signed)
Pt provided with AVS.  Education complete; all questions answered.  Pt leaving ED in stable condition at this time via wheelchair with all belongings. 

## 2023-02-27 NOTE — ED Notes (Signed)
Pt wanting to leave at this time so that he does not miss dialysis.  Pt states he can try OTC medications for his constipation.  This RN to notify provider of same.

## 2023-02-27 NOTE — ED Provider Triage Note (Signed)
Emergency Medicine Provider Triage Evaluation Note  William Webb [place] , a 72 y.o. male  was evaluated in triage.  Pt complains of diffuse abdominal pain and states that he has not had a bowel movement in over 7 days.  He states he has not on any narcotic pain medicine he has no history of constipation per patient.  He states that he was able to take some stool out of his rectum tonight.  No blood in his stool.  Review of Systems  Positive: Abdominal pain, constipation Negative: Vomiting, fever  Physical Exam  BP (!) 184/82 (BP Location: Right Arm)   Pulse 91   Temp 99 F (37.2 C) (Oral)   Resp 18   Ht 6\' 3"  (1.905 m)   Wt 86.2 kg   SpO2 100%   BMI 23.75 kg/m  Gen:   Awake, no distress   Resp:  Normal effort  MSK:   Moves extremities without difficulty  Other:  Abd ttp mild and diffuse, no focal TTP  Medical Decision Making  Medically screening exam initiated at 12:35 AM.  Appropriate orders placed.  William Webb [facility] was informed that the remainder of the evaluation will be completed by another provider, this initial triage assessment does not replace that evaluation, and the importance of remaining in the ED until their evaluation is complete.  Labs, CT   William Webb [place], [place] 02/27/23 [phone]

## 2023-02-27 NOTE — Discharge Instructions (Addendum)
Your CT scan shows that you do have quite a bit of constipation.  The treatment for constipation is multifactorial and certainly always involves hydration, fiber and exercise  Cleanout:  Miralax cleanout 5 capfuls in 24-36 oz gatorade, drink over 4-6 hrs. If stool is not clear after 24 hours, then repeat this dose for a second day.   After Cleanout:  Give Miralax 1 capful in 8 oz juice or gatorade daily for at least 4-6 weeks.  Schedule follow up with primary care provider if no improvement in constipation in 1-2 months, sooner if not resolved after cleanout.  GETTING TO GOOD BOWEL HEALTH. Irregular bowel habits such as constipation and diarrhea can lead to many problems over time.  Having one soft bowel movement a day is the most important way to prevent further problems.  The anorectal canal is designed to handle stretching and feces to safely manage our ability to get rid of solid waste (feces, poop, stool) out of our body.  BUT, hard constipated stools can act like ripping concrete bricks and diarrhea can be a burning fire to this very sensitive area of our body, causing inflamed hemorrhoids, anal fissures, increasing risk is perirectal abscesses, abdominal pain/bloating, an making irritable bowel worse.     The goal: ONE SOFT BOWEL MOVEMENT A DAY!  To have soft, regular bowel movements:  Drink at least 8 tall glasses of water a day.   Take plenty of fiber.  Fiber is the undigested part of plant food that passes into the colon, acting s "natures broom" to encourage bowel motility and movement.  Fiber can absorb and hold large amounts of water. This results in a larger, bulkier stool, which is soft and easier to pass. Work gradually over several weeks up to 6 servings a day of fiber (25g a day even more if needed) in the form of: Vegetables -- Root (potatoes, carrots, turnips), leafy green (lettuce, salad greens, celery, spinach), or cooked high residue (cabbage, broccoli, etc) Fruit -- Fresh  (unpeeled skin & pulp), Dried (prunes, apricots, cherries, etc ),  or stewed ( applesauce)  Whole grain breads, pasta, etc (whole wheat)  Bran cereals  Bulking Agents -- This type of water-retaining fiber generally is easily obtained each day by one of the following:  Psyllium bran -- The psyllium plant is remarkable because its ground seeds can retain so much water. This product is available as Metamucil, Konsyl, Effersyllium, Per Diem Fiber, or the less expensive generic preparation in drug and health food stores. Although labeled a laxative, it really is not a laxative.  Methylcellulose -- This is another fiber derived from wood which also retains water. It is available as Citrucel. Polyethylene Glycol - and "artificial" fiber commonly called Miralax or Glycolax.  It is helpful for people with gassy or bloated feelings with regular fiber Flax Seed - a less gassy fiber than psyllium No reading or other relaxing activity while on the toilet. If bowel movements take longer than 5 minutes, you are too constipated AVOID CONSTIPATION.  High fiber and water intake usually takes care of this.  Sometimes a laxative is needed to stimulate more frequent bowel movements, but  Laxatives are not a good long-term solution as it can wear the colon out. Osmotics (Milk of Magnesia, Fleets phosphosoda, Magnesium citrate, MiraLax, GoLytely) are safer than  Stimulants (Senokot, Castor Oil, Dulcolax, Ex Lax)    Do not take laxatives for more than 7days in a row.  IF SEVERELY CONSTIPATED, try a Bowel Retraining  Program: Do not use laxatives.  Eat a diet high in roughage, such as bran cereals and leafy vegetables.  Drink six (6) ounces of prune or apricot juice each morning.  Eat two (2) large servings of stewed fruit each day.  Take one (1) heaping tablespoon of a psyllium-based bulking agent twice a day. Use sugar-free sweetener when possible to avoid excessive calories.  Eat a normal breakfast.  Set aside 15  minutes after breakfast to sit on the toilet, but do not strain to have a bowel movement.  If you do not have a bowel movement by the third day, use an enema and repeat the above steps.  Controlling diarrhea Switch to liquids and simpler foods for a few days to avoid stressing your intestines further. Avoid dairy products (especially milk & ice cream) for a short time.  The intestines often can lose the ability to digest lactose when stressed. Avoid foods that cause gassiness or bloating.  Typical foods include beans and other legumes, cabbage, broccoli, and dairy foods.  Every person has some sensitivity to other foods, so listen to our body and avoid those foods that trigger problems for you. Adding fiber (Citrucel, Metamucil, psyllium, Miralax) gradually can help thicken stools by absorbing excess fluid and retrain the intestines to act more normally.  Slowly increase the dose over a few weeks.  Too much fiber too soon can backfire and cause cramping & bloating. Probiotics (such as active yogurt, Align, etc) may help repopulate the intestines and colon with normal bacteria and calm down a sensitive digestive tract.  Most studies show it to be of mild help, though, and such products can be costly. Medicines: Bismuth subsalicylate (ex. Kayopectate, Pepto Bismol) every 30 minutes for up to 6 doses can help control diarrhea.  Avoid if pregnant. Loperamide (Immodium) can slow down diarrhea.  Start with two tablets (4mg  total) first and then try one tablet every 6 hours.  Avoid if you are having fevers or severe pain.  If you are not better or start feeling worse, stop all medicines and call your doctor for advice Call your doctor if you are getting worse or not better.  Sometimes further testing (cultures, endoscopy, X-ray studies, bloodwork, etc) may be needed to help diagnose and treat the cause of the diarrhea.  Managing Pain  Pain after surgery or related to activity is often due to strain/injury to  muscle, tendon, nerves and/or incisions.  This pain is usually short-term and will improve in a few months.   Many people find it helpful to do the following things TOGETHER to help speed the process of healing and to get back to regular activity more quickly:  Avoid heavy physical activity  no lifting greater than 20 pounds Do not "push through" the pain.  Listen to your body and avoid positions and maneuvers than reproduce the pain Walking is okay as tolerated, but go slowly and stop when getting sore.  Remember: If it hurts to do it, then don't do it! Take Anti-inflammatory medication  Take with food/snack around the clock for 1-2 weeks This helps the muscle and nerve tissues become less irritable and calm down faster Choose ONE of the following over-the-counter medications: Naproxen 220mg  tabs (ex. Aleve) 1-2 pills twice a day  Ibuprofen 200mg  tabs (ex. Advil, Motrin) 3-4 pills with every meal and just before bedtime Acetaminophen 500mg  tabs (Tylenol) 1-2 pills with every meal and just before bedtime Use a Heating pad or Ice/Cold Pack 4-6 times a day  May use warm bath/hottub  or showers Try Gentle Massage and/or Stretching  at the area of pain many times a day stop if you feel pain - do not overdo it  Try these steps together to help you body heal faster and avoid making things get worse.  Doing just one of these things may not be enough.    If you are not getting better after two weeks or are noticing you are getting worse, contact our office for further advice; we may need to re-evaluate you & see what other things we can do to help.      There are mineral oil enemas that are available over-the-counter you can also buy mineral oil and use a syringe to administer it into your rectum.

## 2023-02-27 NOTE — ED Provider Notes (Signed)
[place] EMERGENCY DEPARTMENT AT [facility] Provider Note   CSN: [id number] Arrival date & time: 02/26/23  2342     History  Chief Complaint  Patient presents with   Constipation    William Webb is a 72 y.o. male.   Constipation  Patient is a 72 year old male with past medical history significant for ESRD on dialysis Monday Wednesday Friday who states his last dialysis session was Monday and he had a full session, history of chronic pain, HTN, CAD, sepsis, A-fib, GI bleed, limb ischemia, erectile dysfunction  Patient states that he has had some abdominal pain and constipation for the past 7+ days.  He denies any opioid medicine use.  And I do not see any prescriptions for opioids and PDMP.  He states that he was able to get some stool out of his rectum tonight manually with a single finger.  He states he has not had any blood in his stool denies any chest pain fever nausea vomiting.  He states he does pass gas occasionally.  He was having some abdominal pain earlier however states that he is not having any discomfort currently.  No other associate symptoms.  No urinary frequency urgency dysuria or hematuria.     Home Medications Prior to Admission medications   Medication Sig Start Date End Date Taking? Authorizing Provider  polyethylene glycol powder (GLYCOLAX/MIRALAX) 17 GM/SCOOP powder Take 17 g by mouth in the morning, at noon, and at bedtime. One scoop in beverage of your choice with each meal. You may increase if you continue to be constipated and decrease if your stool becomes too loose. 02/27/23  Yes Solon Augusta S, PA  acetaminophen (TYLENOL) 650 MG CR tablet Take 1,300 mg by mouth at bedtime.    [provider]  Alcohol Swabs (B-D SINGLE USE SWABS REGULAR) PADS  04/22/20   [provider]  apixaban (ELIQUIS) 5 MG TABS tablet Take 1 tablet (5 mg total) by mouth 2 (two) times daily. 01/10/23   Patwardhan, Anabel Bene, MD  aspirin EC 81 MG  tablet Take 1 tablet (81 mg total) by mouth daily. Swallow whole. 01/10/23   Patwardhan, Anabel Bene, MD  atorvastatin (LIPITOR) 80 MG tablet Take 1 tablet (80 mg total) by mouth daily. 03/20/22 01/10/23  Lanae Boast, MD  B Complex-C-Zn-Folic Acid (DIALYVITE 800 WITH ZINC) 0.8 MG TABS SMARTSIG:1 Tablet(s) By Mouth Every Evening 03/30/22   [provider]  Blood Glucose Monitoring Suppl (ACCU-CHEK GUIDE ME) w/Device KIT 1 Device by Does not apply route 4 (four) times daily -  before meals and at bedtime. 11/19/18   Deeann Saint, MD  Calcium Carbonate-Simethicone (ALKA-SELTZER HEARTBURN + GAS) 750-80 MG CHEW Chew 1 tablet by mouth 3 (three) times daily as needed (indigestion/heartburn.).    [provider]  carvedilol (COREG) 25 MG tablet Take 25 mg by mouth 2 (two) times daily. 09/04/21   [provider]  Continuous Blood Gluc Sensor (DEXCOM G6 SENSOR) MISC 1 Device by Does not apply route See admin instructions. Change every 10 days 01/31/21   Romero Belling, MD  Continuous Blood Gluc Sensor (DEXCOM G6 SENSOR) MISC 1 Device by Does not apply route See admin instructions. Change every 10 days 02/02/21   Romero Belling, MD  Cyanocobalamin (VITAMIN B-12) 5000 MCG TBDP Take 5,000 mcg by mouth in the morning.    [provider]  Dextromethorphan-guaiFENesin (CORICIDIN HBP CONGESTION/COUGH) 10-200 MG CAPS Take 2 capsules by mouth daily as needed (cough/congestion).  [provider]  furosemide (LASIX) 80 MG tablet Take 80 mg by mouth See admin instructions. Take 80 mg by mouth twice a day and additional 80 mg before bedtime as needed for swelling of the ankles    [provider]  gabapentin (NEURONTIN) 600 MG tablet Take 0.5 tablets (300 mg total) by mouth 2 (two) times daily. 08/02/20   Calvert Cantor, MD  GEBAUERS PAIN EASE AERO Apply topically. 03/30/22   [provider]  glucose 4 GM chewable tablet Chew 1 tablet by mouth as needed for low blood sugar.     [provider]  hydrALAZINE (APRESOLINE) 100 MG tablet Take 1 tablet (100 mg total) by mouth 3 (three) times daily. 01/02/23 02/01/23  Patwardhan, Anabel Bene, MD  isosorbide mononitrate (IMDUR) 60 MG 24 hr tablet Take 1 tablet (60 mg total) by mouth daily. Patient taking differently: Take 60 mg by mouth at bedtime. 08/03/20   Calvert Cantor, MD  ketorolac (ACULAR) 0.5 % ophthalmic solution Place 1 drop into both eyes daily as needed (inflamation). 11/22/20   [provider]  Lidocaine 4 % AERO Apply 1 spray topically as needed (prior to port being accessed.).    [provider]  lidocaine-prilocaine (EMLA) cream 1 application as needed (prior to port being accessed). 07/05/21   [provider]  linaclotide (LINZESS) 145 MCG CAPS capsule 1 capsule at least 30 minutes before the first meal of the day on an empty stomach Orally Once a day    [provider]  loratadine (CLARITIN) 10 MG tablet Take 10 mg by mouth daily as needed for allergies.    [provider]  losartan (COZAAR) 100 MG tablet Take 1 tablet (100 mg total) by mouth daily. 12/19/22 03/19/23  Patwardhan, Manish J, MD  NOVOLOG FLEXPEN 100 UNIT/ML FlexPen INJECT 3 UNITS INTO THE SKIN 3 (THREE) TIMES DAILY WITH MEALS. 08/31/20   Romero Belling, MD  ondansetron (ZOFRAN) 4 MG tablet Take 1 tablet (4 mg total) by mouth every 8 (eight) hours as needed for nausea. 08/02/20   Calvert Cantor, MD  pantoprazole (PROTONIX) 40 MG tablet Take 1 tablet (40 mg total) by mouth daily. 03/19/22 01/10/23  Lanae Boast, MD  rosuvastatin (CRESTOR) 10 MG tablet Take 10 mg by mouth daily. 09/14/22   [provider]  sevelamer carbonate (RENVELA) 800 MG tablet Take 800 mg by mouth with breakfast, with lunch, and with evening meal. 01/10/21   [provider]  tamsulosin (FLOMAX) 0.4 MG CAPS capsule Take 0.4 mg by mouth at bedtime. 11/16/19   [provider]  traMADol (ULTRAM) 50 MG tablet Take 1 tablet (50  mg total) by mouth every 12 (twelve) hours as needed for up to 8 doses. 03/19/22   Lanae Boast, MD  VELPHORO 500 MG chewable tablet Chew 500 mg by mouth 3 (three) times daily. 02/14/22   [provider]  vitamin E 1000 UNIT capsule Take 1,000 Units by mouth daily.    [provider]      Allergies    Patient has no known allergies.    Review of Systems   Review of Systems  Gastrointestinal:  Positive for constipation.    Physical Exam Updated Vital Signs BP (!) 178/82   Pulse 90   Temp 99 F (37.2 C) (Oral)   Resp 18   Ht 6\' 3"  (1.905 m)   Wt 86.2 kg   SpO2 100%   BMI 23.75 kg/m  Physical Exam Vitals and nursing note  reviewed.  Constitutional:      General: He is not in acute distress. HENT:     Head: Normocephalic and atraumatic.     Nose: Nose normal.  Eyes:     General: No scleral icterus. Cardiovascular:     Rate and Rhythm: Normal rate and regular rhythm.     Pulses: Normal pulses.     Heart sounds: Normal heart sounds.  Pulmonary:     Effort: Pulmonary effort is normal. No respiratory distress.     Breath sounds: No wheezing.  Abdominal:     Palpations: Abdomen is soft.     Tenderness: There is abdominal tenderness.     Comments: Mild diffuse nonfocal abdominal tenderness.  No guarding or rebound.  Musculoskeletal:     Cervical back: Normal range of motion.     Right lower leg: No edema.     Left lower leg: No edema.  Skin:    General: Skin is warm and dry.     Capillary Refill: Capillary refill takes less than 2 seconds.  Neurological:     Mental Status: He is alert. Mental status is at baseline.  Psychiatric:        Mood and Affect: Mood normal.        Behavior: Behavior normal.    ED Results / Procedures / Treatments   Labs (all labs ordered are listed, but only abnormal results are displayed) Labs Reviewed  COMPREHENSIVE METABOLIC PANEL - Abnormal; Notable for the following components:      Result Value   Chloride 92 (*)     Glucose, Bld 136 (*)    BUN 33 (*)    Creatinine, Ser 7.17 (*)    GFR, Estimated 7 (*)    Anion gap 18 (*)    All other components within normal limits  CBC - Abnormal; Notable for the following components:   RBC 3.46 (*)    Hemoglobin 10.9 (*)    HCT 32.3 (*)    All other components within normal limits  LIPASE, BLOOD    EKG None  Radiology CT ABDOMEN PELVIS W CONTRAST  Result Date: 02/27/2023 CLINICAL DATA:  Abdomen pain EXAM: CT ABDOMEN AND PELVIS WITH CONTRAST TECHNIQUE: Multidetector CT imaging of the abdomen and pelvis was performed using the standard protocol following bolus administration of intravenous contrast. RADIATION DOSE REDUCTION: This exam was performed according to the departmental dose-optimization program which includes automated exposure control, adjustment of the mA and/or kV according to patient size and/or use of iterative reconstruction technique. CONTRAST:  75mL OMNIPAQUE IOHEXOL 350 MG/ML SOLN COMPARISON:  CT 09/28/2021 FINDINGS: Lower chest: Lung bases demonstrate no acute airspace disease. Coronary vascular calcification Hepatobiliary: Small gallstones. No focal hepatic abnormality. No biliary dilatation Pancreas: Unremarkable. No pancreatic ductal dilatation or surrounding inflammatory changes. Spleen: Normal in size without focal abnormality. Adrenals/Urinary Tract: Adrenal glands are stable. 1.8 cm right adrenal nodule, previously 1.8 cm and consistent with adenoma. No imaging follow-up is recommended. Cortical scarring right kidney. No hydronephrosis. Poor excretion of contrast on delayed views consistent with decreased renal function. The bladder is slightly thick walled Stomach/Bowel: The stomach is nonenlarged. There is no dilated small bowel. No acute bowel wall thickening. Moderate stool burden with moderate retained feces at the rectum. Negative appendix. Vascular/Lymphatic: Advanced aortic atherosclerosis. No aneurysm. No suspicious lymph nodes Reproductive:  Prostate is unremarkable. Other: Negative for pelvic effusion or free air. Musculoskeletal: No acute or suspicious osseous abnormality. IMPRESSION: 1. No CT evidence for acute intra-abdominal or pelvic  abnormality. Moderate stool in the colon. 2. Gallstones. 3. Poor excretion of contrast on delayed views consistent with decreased renal function. Correlate with appropriate laboratory values. 4. Aortic atherosclerosis. Aortic Atherosclerosis (ICD10-I70.0). Electronically Signed   By: Jasmine Pang M.D.   On: 02/27/2023 02:39    Procedures Procedures    Medications Ordered in ED Medications  iohexol (OMNIPAQUE) 350 MG/ML injection 75 mL (75 mLs Intravenous Contrast Given 02/27/23 0220)    ED Course/ Medical Decision Making/ A&P                             Medical Decision Making Amount and/or Complexity of Data Reviewed Labs: ordered. Radiology: ordered.  Risk Prescription drug management.   This patient presents to the ED for concern of abdominal pain, constipation, this involves a number of treatment options, and is a complaint that carries with it a moderate to high risk of complications and morbidity. A differential diagnosis was considered for the patient's symptoms which is discussed below:      Co morbidities: Discussed in HPI   Brief History:  Patient is a 71 year old male with past medical history significant for ESRD on dialysis Monday Wednesday Friday who states his last dialysis session was Monday and he had a full session, history of chronic pain, HTN, CAD, sepsis, A-fib, GI bleed, limb ischemia, erectile dysfunction  Patient states that he has had some abdominal pain and constipation for the past 7+ days.  He denies any opioid medicine use.  And I do not see any prescriptions for opioids and PDMP.  He states that he was able to get some stool out of his rectum tonight manually with a single finger.  He states he has not had any blood in his stool denies any chest pain  fever nausea vomiting.  He states he does pass gas occasionally.  He was having some abdominal pain earlier however states that he is not having any discomfort currently.  No other associate symptoms.  No urinary frequency urgency dysuria or hematuria.    EMR reviewed including pt PMHx, past surgical history and past visits to ER.   See HPI for more details   Lab Tests:   I ordered and independently interpreted labs. Labs notable for CMP with elevated creatinine and BUN consistent with patient being on dialysis patient.  Electrolytes normal potassium normal, lipase within normal limits, CBC without leukocytosis or clinically significant anemia.  Imaging Studies:  NAD. I personally reviewed all imaging studies and no acute abnormality found. I agree with radiology interpretation. Gallstones incidentally seen on CT imaging patient has no right upper quadrant abdominal pain.  Will follow-up outpatient IMPRESSION:  1. No CT evidence for acute intra-abdominal or pelvic abnormality.  Moderate stool in the colon.  2. Gallstones.  3. Poor excretion of contrast on delayed views consistent with  decreased renal function. Correlate with appropriate laboratory  values.  4. Aortic atherosclerosis.    Cardiac Monitoring:  NA NA   Medicines ordered:    Critical Interventions:     Consults/Attending Physician   I discussed this case with my attending physician who cosigned this note including patient's presenting symptoms, physical exam, and planned diagnostics and interventions. Attending physician stated agreement with plan or made changes to plan which were implemented.   Attending physician assessed patient at bedside.    Reevaluation:  After the interventions noted above I re-evaluated patient and found that they have :stayed the same  Social Determinants of Health:      Problem List / ED Course:  Patient is a 72 year old gentleman with chief complaint of  constipation.  He did endorses some abdominal pain but is not ill-appearing overall no acute distress.  Normal vital signs apart from hypertension.  CT abdomen pelvis does show some gallstones with no evidence of cholecystitis.  Does have some diffuse colonic constipation on my viewing of CT imaging.  Offered disimpaction although he has diffuse constipation he does have a stool ball in the rectum.  He states that he would prefer to hold off on this.  Recommend conservative therapy, MiraLAX and follow-up with PCP.  Return precautions discussed.  Patient tolerating p.o., ambulatory at time of discharge and will go to dialysis appointment later today.   Dispostion:  After consideration of the diagnostic results and the patients response to treatment, I feel that the patent would benefit from discharge  Final Clinical Impression(s) / ED Diagnoses Final diagnoses:  Constipation, unspecified constipation type  Calculus of gallbladder without cholecystitis without obstruction    Rx / DC Orders ED Discharge Orders          Ordered    polyethylene glycol powder (GLYCOLAX/MIRALAX) 17 GM/SCOOP powder  3 times daily        02/27/23 0413              Gailen Shelter, PA 02/27/23 2219    Zadie Rhine, MD 03/01/23 1006

## 2023-02-28 DIAGNOSIS — N186 End stage renal disease: Secondary | ICD-10-CM | POA: Diagnosis not present

## 2023-02-28 DIAGNOSIS — D631 Anemia in chronic kidney disease: Secondary | ICD-10-CM | POA: Diagnosis not present

## 2023-02-28 DIAGNOSIS — N2581 Secondary hyperparathyroidism of renal origin: Secondary | ICD-10-CM | POA: Diagnosis not present

## 2023-02-28 DIAGNOSIS — R110 Nausea: Secondary | ICD-10-CM | POA: Diagnosis not present

## 2023-02-28 DIAGNOSIS — E1129 Type 2 diabetes mellitus with other diabetic kidney complication: Secondary | ICD-10-CM | POA: Diagnosis not present

## 2023-02-28 DIAGNOSIS — Z992 Dependence on renal dialysis: Secondary | ICD-10-CM | POA: Diagnosis not present

## 2023-02-28 DIAGNOSIS — D689 Coagulation defect, unspecified: Secondary | ICD-10-CM | POA: Diagnosis not present

## 2023-02-28 DIAGNOSIS — D509 Iron deficiency anemia, unspecified: Secondary | ICD-10-CM | POA: Diagnosis not present

## 2023-03-01 DIAGNOSIS — D509 Iron deficiency anemia, unspecified: Secondary | ICD-10-CM | POA: Diagnosis not present

## 2023-03-01 DIAGNOSIS — N186 End stage renal disease: Secondary | ICD-10-CM | POA: Diagnosis not present

## 2023-03-01 DIAGNOSIS — E1129 Type 2 diabetes mellitus with other diabetic kidney complication: Secondary | ICD-10-CM | POA: Diagnosis not present

## 2023-03-01 DIAGNOSIS — D631 Anemia in chronic kidney disease: Secondary | ICD-10-CM | POA: Diagnosis not present

## 2023-03-01 DIAGNOSIS — N2581 Secondary hyperparathyroidism of renal origin: Secondary | ICD-10-CM | POA: Diagnosis not present

## 2023-03-01 DIAGNOSIS — Z992 Dependence on renal dialysis: Secondary | ICD-10-CM | POA: Diagnosis not present

## 2023-03-01 DIAGNOSIS — D689 Coagulation defect, unspecified: Secondary | ICD-10-CM | POA: Diagnosis not present

## 2023-03-01 DIAGNOSIS — R110 Nausea: Secondary | ICD-10-CM | POA: Diagnosis not present

## 2023-03-04 ENCOUNTER — Encounter (HOSPITAL_COMMUNITY): Payer: Self-pay

## 2023-03-04 DIAGNOSIS — E1129 Type 2 diabetes mellitus with other diabetic kidney complication: Secondary | ICD-10-CM | POA: Diagnosis not present

## 2023-03-04 DIAGNOSIS — D631 Anemia in chronic kidney disease: Secondary | ICD-10-CM | POA: Diagnosis not present

## 2023-03-04 DIAGNOSIS — D689 Coagulation defect, unspecified: Secondary | ICD-10-CM | POA: Diagnosis not present

## 2023-03-04 DIAGNOSIS — R110 Nausea: Secondary | ICD-10-CM | POA: Diagnosis not present

## 2023-03-04 DIAGNOSIS — Z992 Dependence on renal dialysis: Secondary | ICD-10-CM | POA: Diagnosis not present

## 2023-03-04 DIAGNOSIS — N186 End stage renal disease: Secondary | ICD-10-CM | POA: Diagnosis not present

## 2023-03-04 DIAGNOSIS — D509 Iron deficiency anemia, unspecified: Secondary | ICD-10-CM | POA: Diagnosis not present

## 2023-03-04 DIAGNOSIS — N2581 Secondary hyperparathyroidism of renal origin: Secondary | ICD-10-CM | POA: Diagnosis not present

## 2023-03-06 DIAGNOSIS — N2581 Secondary hyperparathyroidism of renal origin: Secondary | ICD-10-CM | POA: Diagnosis not present

## 2023-03-06 DIAGNOSIS — N186 End stage renal disease: Secondary | ICD-10-CM | POA: Diagnosis not present

## 2023-03-06 DIAGNOSIS — E1129 Type 2 diabetes mellitus with other diabetic kidney complication: Secondary | ICD-10-CM | POA: Diagnosis not present

## 2023-03-06 DIAGNOSIS — R110 Nausea: Secondary | ICD-10-CM | POA: Diagnosis not present

## 2023-03-06 DIAGNOSIS — D631 Anemia in chronic kidney disease: Secondary | ICD-10-CM | POA: Diagnosis not present

## 2023-03-06 DIAGNOSIS — D689 Coagulation defect, unspecified: Secondary | ICD-10-CM | POA: Diagnosis not present

## 2023-03-06 DIAGNOSIS — Z992 Dependence on renal dialysis: Secondary | ICD-10-CM | POA: Diagnosis not present

## 2023-03-06 DIAGNOSIS — D509 Iron deficiency anemia, unspecified: Secondary | ICD-10-CM | POA: Diagnosis not present

## 2023-03-08 DIAGNOSIS — Z992 Dependence on renal dialysis: Secondary | ICD-10-CM | POA: Diagnosis not present

## 2023-03-08 DIAGNOSIS — N186 End stage renal disease: Secondary | ICD-10-CM | POA: Diagnosis not present

## 2023-03-08 DIAGNOSIS — D631 Anemia in chronic kidney disease: Secondary | ICD-10-CM | POA: Diagnosis not present

## 2023-03-08 DIAGNOSIS — E1129 Type 2 diabetes mellitus with other diabetic kidney complication: Secondary | ICD-10-CM | POA: Diagnosis not present

## 2023-03-08 DIAGNOSIS — D689 Coagulation defect, unspecified: Secondary | ICD-10-CM | POA: Diagnosis not present

## 2023-03-08 DIAGNOSIS — D509 Iron deficiency anemia, unspecified: Secondary | ICD-10-CM | POA: Diagnosis not present

## 2023-03-08 DIAGNOSIS — R110 Nausea: Secondary | ICD-10-CM | POA: Diagnosis not present

## 2023-03-08 DIAGNOSIS — N2581 Secondary hyperparathyroidism of renal origin: Secondary | ICD-10-CM | POA: Diagnosis not present

## 2023-03-11 DIAGNOSIS — D631 Anemia in chronic kidney disease: Secondary | ICD-10-CM | POA: Diagnosis not present

## 2023-03-11 DIAGNOSIS — D689 Coagulation defect, unspecified: Secondary | ICD-10-CM | POA: Diagnosis not present

## 2023-03-11 DIAGNOSIS — E1129 Type 2 diabetes mellitus with other diabetic kidney complication: Secondary | ICD-10-CM | POA: Diagnosis not present

## 2023-03-11 DIAGNOSIS — N186 End stage renal disease: Secondary | ICD-10-CM | POA: Diagnosis not present

## 2023-03-11 DIAGNOSIS — R110 Nausea: Secondary | ICD-10-CM | POA: Diagnosis not present

## 2023-03-11 DIAGNOSIS — Z992 Dependence on renal dialysis: Secondary | ICD-10-CM | POA: Diagnosis not present

## 2023-03-11 DIAGNOSIS — D509 Iron deficiency anemia, unspecified: Secondary | ICD-10-CM | POA: Diagnosis not present

## 2023-03-11 DIAGNOSIS — N2581 Secondary hyperparathyroidism of renal origin: Secondary | ICD-10-CM | POA: Diagnosis not present

## 2023-03-12 ENCOUNTER — Ambulatory Visit: Payer: Medicare Other

## 2023-03-12 DIAGNOSIS — I350 Nonrheumatic aortic (valve) stenosis: Secondary | ICD-10-CM | POA: Diagnosis not present

## 2023-03-13 DIAGNOSIS — D631 Anemia in chronic kidney disease: Secondary | ICD-10-CM | POA: Diagnosis not present

## 2023-03-13 DIAGNOSIS — D689 Coagulation defect, unspecified: Secondary | ICD-10-CM | POA: Diagnosis not present

## 2023-03-13 DIAGNOSIS — N186 End stage renal disease: Secondary | ICD-10-CM | POA: Diagnosis not present

## 2023-03-13 DIAGNOSIS — Z992 Dependence on renal dialysis: Secondary | ICD-10-CM | POA: Diagnosis not present

## 2023-03-13 DIAGNOSIS — D509 Iron deficiency anemia, unspecified: Secondary | ICD-10-CM | POA: Diagnosis not present

## 2023-03-13 DIAGNOSIS — N2581 Secondary hyperparathyroidism of renal origin: Secondary | ICD-10-CM | POA: Diagnosis not present

## 2023-03-13 DIAGNOSIS — E1129 Type 2 diabetes mellitus with other diabetic kidney complication: Secondary | ICD-10-CM | POA: Diagnosis not present

## 2023-03-13 DIAGNOSIS — R110 Nausea: Secondary | ICD-10-CM | POA: Diagnosis not present

## 2023-03-14 ENCOUNTER — Other Ambulatory Visit: Payer: Self-pay

## 2023-03-14 ENCOUNTER — Encounter (HOSPITAL_COMMUNITY): Payer: Self-pay

## 2023-03-14 ENCOUNTER — Emergency Department (HOSPITAL_COMMUNITY)
Admission: EM | Admit: 2023-03-14 | Discharge: 2023-03-15 | Disposition: A | Discharge status: Home / Self Care | Payer: Medicare Other | Attending: Emergency Medicine | Admitting: Emergency Medicine

## 2023-03-14 ENCOUNTER — Emergency Department (HOSPITAL_COMMUNITY): Payer: Medicare Other

## 2023-03-14 DIAGNOSIS — R55 Syncope and collapse: Secondary | ICD-10-CM | POA: Diagnosis not present

## 2023-03-14 DIAGNOSIS — Z743 Need for continuous supervision: Secondary | ICD-10-CM | POA: Diagnosis not present

## 2023-03-14 DIAGNOSIS — R404 Transient alteration of awareness: Secondary | ICD-10-CM | POA: Diagnosis not present

## 2023-03-14 DIAGNOSIS — Z992 Dependence on renal dialysis: Secondary | ICD-10-CM | POA: Diagnosis not present

## 2023-03-14 DIAGNOSIS — Z7901 Long term (current) use of anticoagulants: Secondary | ICD-10-CM | POA: Insufficient documentation

## 2023-03-14 DIAGNOSIS — I10 Essential (primary) hypertension: Secondary | ICD-10-CM | POA: Diagnosis not present

## 2023-03-14 DIAGNOSIS — R6889 Other general symptoms and signs: Secondary | ICD-10-CM | POA: Diagnosis not present

## 2023-03-14 DIAGNOSIS — N186 End stage renal disease: Secondary | ICD-10-CM | POA: Insufficient documentation

## 2023-03-14 DIAGNOSIS — Z87891 Personal history of nicotine dependence: Secondary | ICD-10-CM | POA: Insufficient documentation

## 2023-03-14 DIAGNOSIS — D631 Anemia in chronic kidney disease: Secondary | ICD-10-CM | POA: Insufficient documentation

## 2023-03-14 DIAGNOSIS — R9431 Abnormal electrocardiogram [ECG] [EKG]: Secondary | ICD-10-CM | POA: Diagnosis not present

## 2023-03-14 DIAGNOSIS — I132 Hypertensive heart and chronic kidney disease with heart failure and with stage 5 chronic kidney disease, or end stage renal disease: Secondary | ICD-10-CM | POA: Insufficient documentation

## 2023-03-14 DIAGNOSIS — E860 Dehydration: Secondary | ICD-10-CM | POA: Diagnosis not present

## 2023-03-14 DIAGNOSIS — I509 Heart failure, unspecified: Secondary | ICD-10-CM | POA: Insufficient documentation

## 2023-03-14 LAB — BASIC METABOLIC PANEL
Anion gap: 17 — ABNORMAL HIGH (ref 5–15)
BUN: 24 mg/dL — ABNORMAL HIGH (ref 8–23)
CO2: 28 mmol/L (ref 22–32)
Calcium: 8 mg/dL — ABNORMAL LOW (ref 8.9–10.3)
Chloride: 90 mmol/L — ABNORMAL LOW (ref 98–111)
Creatinine, Ser: 5.14 mg/dL — ABNORMAL HIGH (ref 0.61–1.24)
GFR, Estimated: 11 mL/min — ABNORMAL LOW (ref >60)
Glucose, Bld: 108 mg/dL — ABNORMAL HIGH (ref 70–99)
Potassium: 3.6 mmol/L (ref 3.5–5.1)
Sodium: 135 mmol/L (ref 135–145)

## 2023-03-14 LAB — CBC
HCT: 23.4 % — ABNORMAL LOW (ref 39.0–52.0)
Hemoglobin: 7.5 g/dL — ABNORMAL LOW (ref 13.0–17.0)
MCH: 31.6 pg (ref 26.0–34.0)
MCHC: 32.1 g/dL (ref 30.0–36.0)
MCV: 98.7 fL (ref 80.0–100.0)
Platelets: 114 10*3/uL — ABNORMAL LOW (ref 150–400)
RBC: 2.37 MIL/uL — ABNORMAL LOW (ref 4.22–5.81)
RDW: 14.9 % (ref 11.5–15.5)
WBC: 5 10*3/uL (ref 4.0–10.5)
nRBC: 0 % (ref 0.0–0.2)

## 2023-03-14 LAB — TROPONIN I (HIGH SENSITIVITY): Troponin I (High Sensitivity): 44 ng/L — ABNORMAL HIGH (ref <18)

## 2023-03-14 LAB — POC OCCULT BLOOD, ED: Fecal Occult Bld: NEGATIVE

## 2023-03-14 MED ORDER — SODIUM CHLORIDE 0.9 % IV BOLUS: 500.0000 mL | Freq: Once | INTRAVENOUS | Status: DC

## 2023-03-14 NOTE — ED Triage Notes (Signed)
Pt arrived via [facility] from home for syncopex3 today.  Per EMS, pt has syncopal episode when standing and walking for a little bit. Per EMS, first bp 138/20. Pt is on eliquis. Pt states the first episode of syncope happened while wiping his car and pt's cousin witnessed it. Pt states second syncope was while at niece's house while going up the stairs. The third episode happened at [facility] and niece witnessed it. Pt denies hitting head. Pt dialysis pt Mon, Wed, Fri.

## 2023-03-14 NOTE — ED Provider Triage Note (Signed)
Emergency Medicine Provider Triage Evaluation Note  William Webb [place] , a 72 y.o. male  was evaluated in triage.  Pt complains of syncope. He states that same occurred 3 times today. Denies any prodromal symptoms, states 'I was just walking and next thing I knew I was on the ground.' Denies any headache, vision changes, dizziness, chest pain, shortness of breath, nausea, or vomiting. States that this has happened before, however he just assumed he was dehydrated and did not get seen for it.  He is anticoagulated with Eliquis.  Is a Monday Wednesday Friday dialysis patient and was last dialyzed yesterday and received a full session.  He states that each time he had a syncopal episode someone caught him and therefore he did not hit his head.  He denies any injuries from the episodes.  Review of Systems  Positive:  Negative:  Physical Exam  BP (!) 141/59   Pulse 66   Temp 99.4 F (37.4 C) (Oral)   Resp 18   Ht 6\' 3"  (1.905 m)   Wt 86.2 kg   SpO2 92%   BMI 23.75 kg/m  Gen:   Awake, no distress   Resp:  Normal effort  MSK:   Moves extremities without difficulty  Other:    Medical Decision Making  Medically screening exam initiated at 6:42 PM.  Appropriate orders placed.  William Webb [facility] was informed that the remainder of the evaluation will be completed by another provider, this initial triage assessment does not replace that evaluation, and the importance of remaining in the ED until their evaluation is complete.     William Bandy, PA-C 03/14/23 1844

## 2023-03-14 NOTE — ED Notes (Signed)
Pt refused blood work  

## 2023-03-15 DIAGNOSIS — D689 Coagulation defect, unspecified: Secondary | ICD-10-CM | POA: Diagnosis not present

## 2023-03-15 DIAGNOSIS — R55 Syncope and collapse: Secondary | ICD-10-CM | POA: Diagnosis not present

## 2023-03-15 DIAGNOSIS — D631 Anemia in chronic kidney disease: Secondary | ICD-10-CM | POA: Diagnosis not present

## 2023-03-15 DIAGNOSIS — N2581 Secondary hyperparathyroidism of renal origin: Secondary | ICD-10-CM | POA: Diagnosis not present

## 2023-03-15 DIAGNOSIS — R110 Nausea: Secondary | ICD-10-CM | POA: Diagnosis not present

## 2023-03-15 DIAGNOSIS — E1129 Type 2 diabetes mellitus with other diabetic kidney complication: Secondary | ICD-10-CM | POA: Diagnosis not present

## 2023-03-15 DIAGNOSIS — D509 Iron deficiency anemia, unspecified: Secondary | ICD-10-CM | POA: Diagnosis not present

## 2023-03-15 DIAGNOSIS — N186 End stage renal disease: Secondary | ICD-10-CM | POA: Diagnosis not present

## 2023-03-15 DIAGNOSIS — Z992 Dependence on renal dialysis: Secondary | ICD-10-CM | POA: Diagnosis not present

## 2023-03-15 LAB — TROPONIN I (HIGH SENSITIVITY): Troponin I (High Sensitivity): 42 ng/L — ABNORMAL HIGH (ref <18)

## 2023-03-15 MED ORDER — SODIUM CHLORIDE 0.9 % IV BOLUS: 500.0000 mL | Freq: Once | INTRAVENOUS | Status: DC
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
## 2023-03-15 NOTE — Discharge Instructions (Signed)
You were evaluated in the Emergency Department and after careful evaluation, we did not find any emergent condition requiring admission or further testing in the hospital.  Your exam/testing today is overall reassuring.  Symptoms may be due to low blood counts and dehydration.  Very important that you follow-up with your regular doctors to discuss your anemia.  Until then try to be very careful when standing from a seated position.  Please return to the Emergency Department if you experience any worsening of your condition.   Thank you for allowing [place] to be a part of your care.

## 2023-03-18 DIAGNOSIS — E1129 Type 2 diabetes mellitus with other diabetic kidney complication: Secondary | ICD-10-CM | POA: Diagnosis not present

## 2023-03-18 DIAGNOSIS — Z992 Dependence on renal dialysis: Secondary | ICD-10-CM | POA: Diagnosis not present

## 2023-03-18 DIAGNOSIS — D509 Iron deficiency anemia, unspecified: Secondary | ICD-10-CM | POA: Diagnosis not present

## 2023-03-18 DIAGNOSIS — N186 End stage renal disease: Secondary | ICD-10-CM | POA: Diagnosis not present

## 2023-03-18 DIAGNOSIS — N2581 Secondary hyperparathyroidism of renal origin: Secondary | ICD-10-CM | POA: Diagnosis not present

## 2023-03-18 DIAGNOSIS — D689 Coagulation defect, unspecified: Secondary | ICD-10-CM | POA: Diagnosis not present

## 2023-03-18 DIAGNOSIS — R110 Nausea: Secondary | ICD-10-CM | POA: Diagnosis not present

## 2023-03-18 DIAGNOSIS — D631 Anemia in chronic kidney disease: Secondary | ICD-10-CM | POA: Diagnosis not present

## 2023-03-20 DIAGNOSIS — D689 Coagulation defect, unspecified: Secondary | ICD-10-CM | POA: Diagnosis not present

## 2023-03-20 DIAGNOSIS — N186 End stage renal disease: Secondary | ICD-10-CM | POA: Diagnosis not present

## 2023-03-20 DIAGNOSIS — R110 Nausea: Secondary | ICD-10-CM | POA: Diagnosis not present

## 2023-03-20 DIAGNOSIS — D509 Iron deficiency anemia, unspecified: Secondary | ICD-10-CM | POA: Diagnosis not present

## 2023-03-20 DIAGNOSIS — E1129 Type 2 diabetes mellitus with other diabetic kidney complication: Secondary | ICD-10-CM | POA: Diagnosis not present

## 2023-03-20 DIAGNOSIS — N2581 Secondary hyperparathyroidism of renal origin: Secondary | ICD-10-CM | POA: Diagnosis not present

## 2023-03-20 DIAGNOSIS — D631 Anemia in chronic kidney disease: Secondary | ICD-10-CM | POA: Diagnosis not present

## 2023-03-20 DIAGNOSIS — Z992 Dependence on renal dialysis: Secondary | ICD-10-CM | POA: Diagnosis not present

## 2023-03-21 ENCOUNTER — Inpatient Hospital Stay (HOSPITAL_COMMUNITY)
Admission: EM | Admit: 2023-03-21 | Discharge: 2023-03-23 | DRG: 393 | Disposition: A | Discharge status: Home / Self Care | Payer: Medicare Other | Attending: Internal Medicine | Admitting: Internal Medicine

## 2023-03-21 ENCOUNTER — Inpatient Hospital Stay (HOSPITAL_COMMUNITY): Payer: Medicare Other

## 2023-03-21 DIAGNOSIS — D649 Anemia, unspecified: Secondary | ICD-10-CM

## 2023-03-21 DIAGNOSIS — E1169 Type 2 diabetes mellitus with other specified complication: Secondary | ICD-10-CM | POA: Diagnosis not present

## 2023-03-21 DIAGNOSIS — E113291 Type 2 diabetes mellitus with mild nonproliferative diabetic retinopathy without macular edema, right eye: Secondary | ICD-10-CM | POA: Diagnosis present

## 2023-03-21 DIAGNOSIS — E1142 Type 2 diabetes mellitus with diabetic polyneuropathy: Secondary | ICD-10-CM | POA: Diagnosis present

## 2023-03-21 DIAGNOSIS — Z7901 Long term (current) use of anticoagulants: Secondary | ICD-10-CM

## 2023-03-21 DIAGNOSIS — M797 Fibromyalgia: Secondary | ICD-10-CM | POA: Diagnosis not present

## 2023-03-21 DIAGNOSIS — E113592 Type 2 diabetes mellitus with proliferative diabetic retinopathy without macular edema, left eye: Secondary | ICD-10-CM | POA: Diagnosis present

## 2023-03-21 DIAGNOSIS — Z992 Dependence on renal dialysis: Secondary | ICD-10-CM | POA: Diagnosis not present

## 2023-03-21 DIAGNOSIS — D62 Acute posthemorrhagic anemia: Secondary | ICD-10-CM | POA: Diagnosis present

## 2023-03-21 DIAGNOSIS — I5032 Chronic diastolic (congestive) heart failure: Secondary | ICD-10-CM | POA: Diagnosis not present

## 2023-03-21 DIAGNOSIS — K625 Hemorrhage of anus and rectum: Secondary | ICD-10-CM | POA: Diagnosis not present

## 2023-03-21 DIAGNOSIS — K648 Other hemorrhoids: Principal | ICD-10-CM

## 2023-03-21 DIAGNOSIS — N2581 Secondary hyperparathyroidism of renal origin: Secondary | ICD-10-CM | POA: Diagnosis not present

## 2023-03-21 DIAGNOSIS — N1831 Chronic kidney disease, stage 3a: Secondary | ICD-10-CM | POA: Diagnosis not present

## 2023-03-21 DIAGNOSIS — I10 Essential (primary) hypertension: Secondary | ICD-10-CM | POA: Diagnosis not present

## 2023-03-21 DIAGNOSIS — K649 Unspecified hemorrhoids: Secondary | ICD-10-CM

## 2023-03-21 DIAGNOSIS — I2510 Atherosclerotic heart disease of native coronary artery without angina pectoris: Secondary | ICD-10-CM | POA: Diagnosis present

## 2023-03-21 DIAGNOSIS — Z8551 Personal history of malignant neoplasm of bladder: Secondary | ICD-10-CM | POA: Diagnosis not present

## 2023-03-21 DIAGNOSIS — Z89421 Acquired absence of other right toe(s): Secondary | ICD-10-CM

## 2023-03-21 DIAGNOSIS — M810 Age-related osteoporosis without current pathological fracture: Secondary | ICD-10-CM | POA: Diagnosis present

## 2023-03-21 DIAGNOSIS — I4819 Other persistent atrial fibrillation: Secondary | ICD-10-CM | POA: Diagnosis present

## 2023-03-21 DIAGNOSIS — Z800 Family history of malignant neoplasm of digestive organs: Secondary | ICD-10-CM

## 2023-03-21 DIAGNOSIS — Z87891 Personal history of nicotine dependence: Secondary | ICD-10-CM

## 2023-03-21 DIAGNOSIS — R9431 Abnormal electrocardiogram [ECG] [EKG]: Secondary | ICD-10-CM | POA: Diagnosis not present

## 2023-03-21 DIAGNOSIS — I252 Old myocardial infarction: Secondary | ICD-10-CM

## 2023-03-21 DIAGNOSIS — Z96651 Presence of right artificial knee joint: Secondary | ICD-10-CM | POA: Diagnosis not present

## 2023-03-21 DIAGNOSIS — R531 Weakness: Secondary | ICD-10-CM | POA: Diagnosis not present

## 2023-03-21 DIAGNOSIS — Z91148 Patient's other noncompliance with medication regimen for other reason: Secondary | ICD-10-CM

## 2023-03-21 DIAGNOSIS — N186 End stage renal disease: Secondary | ICD-10-CM | POA: Diagnosis present

## 2023-03-21 DIAGNOSIS — Z8701 Personal history of pneumonia (recurrent): Secondary | ICD-10-CM

## 2023-03-21 DIAGNOSIS — K921 Melena: Secondary | ICD-10-CM | POA: Diagnosis not present

## 2023-03-21 DIAGNOSIS — I480 Paroxysmal atrial fibrillation: Secondary | ICD-10-CM | POA: Diagnosis not present

## 2023-03-21 DIAGNOSIS — K5900 Constipation, unspecified: Secondary | ICD-10-CM | POA: Diagnosis present

## 2023-03-21 DIAGNOSIS — E1122 Type 2 diabetes mellitus with diabetic chronic kidney disease: Secondary | ICD-10-CM | POA: Diagnosis not present

## 2023-03-21 DIAGNOSIS — R55 Syncope and collapse: Secondary | ICD-10-CM | POA: Diagnosis not present

## 2023-03-21 DIAGNOSIS — M1990 Unspecified osteoarthritis, unspecified site: Secondary | ICD-10-CM | POA: Diagnosis present

## 2023-03-21 DIAGNOSIS — E785 Hyperlipidemia, unspecified: Secondary | ICD-10-CM

## 2023-03-21 DIAGNOSIS — E1151 Type 2 diabetes mellitus with diabetic peripheral angiopathy without gangrene: Secondary | ICD-10-CM | POA: Diagnosis present

## 2023-03-21 DIAGNOSIS — K449 Diaphragmatic hernia without obstruction or gangrene: Secondary | ICD-10-CM | POA: Diagnosis present

## 2023-03-21 DIAGNOSIS — Z7902 Long term (current) use of antithrombotics/antiplatelets: Secondary | ICD-10-CM

## 2023-03-21 DIAGNOSIS — Z7982 Long term (current) use of aspirin: Secondary | ICD-10-CM

## 2023-03-21 DIAGNOSIS — Z8249 Family history of ischemic heart disease and other diseases of the circulatory system: Secondary | ICD-10-CM

## 2023-03-21 DIAGNOSIS — Z794 Long term (current) use of insulin: Secondary | ICD-10-CM

## 2023-03-21 DIAGNOSIS — Z79899 Other long term (current) drug therapy: Secondary | ICD-10-CM

## 2023-03-21 DIAGNOSIS — D638 Anemia in other chronic diseases classified elsewhere: Secondary | ICD-10-CM | POA: Diagnosis present

## 2023-03-21 DIAGNOSIS — I132 Hypertensive heart and chronic kidney disease with heart failure and with stage 5 chronic kidney disease, or end stage renal disease: Secondary | ICD-10-CM | POA: Diagnosis not present

## 2023-03-21 DIAGNOSIS — K219 Gastro-esophageal reflux disease without esophagitis: Secondary | ICD-10-CM | POA: Diagnosis present

## 2023-03-21 DIAGNOSIS — D631 Anemia in chronic kidney disease: Secondary | ICD-10-CM | POA: Diagnosis not present

## 2023-03-21 DIAGNOSIS — I120 Hypertensive chronic kidney disease with stage 5 chronic kidney disease or end stage renal disease: Secondary | ICD-10-CM | POA: Diagnosis not present

## 2023-03-21 DIAGNOSIS — Z833 Family history of diabetes mellitus: Secondary | ICD-10-CM

## 2023-03-21 DIAGNOSIS — E119 Type 2 diabetes mellitus without complications: Secondary | ICD-10-CM

## 2023-03-21 DIAGNOSIS — Z83438 Family history of other disorder of lipoprotein metabolism and other lipidemia: Secondary | ICD-10-CM

## 2023-03-21 LAB — COMPREHENSIVE METABOLIC PANEL
ALT: 75 U/L — ABNORMAL HIGH (ref 0–44)
AST: 63 U/L — ABNORMAL HIGH (ref 15–41)
Albumin: 3.2 g/dL — ABNORMAL LOW (ref 3.5–5.0)
Alkaline Phosphatase: 110 U/L (ref 38–126)
Anion gap: 12 (ref 5–15)
BUN: 23 mg/dL (ref 8–23)
CO2: 30 mmol/L (ref 22–32)
Calcium: 7.6 mg/dL — ABNORMAL LOW (ref 8.9–10.3)
Chloride: 97 mmol/L — ABNORMAL LOW (ref 98–111)
Creatinine, Ser: 5.17 mg/dL — ABNORMAL HIGH (ref 0.61–1.24)
GFR, Estimated: 11 mL/min — ABNORMAL LOW (ref >60)
Glucose, Bld: 125 mg/dL — ABNORMAL HIGH (ref 70–99)
Potassium: 3.5 mmol/L (ref 3.5–5.1)
Sodium: 139 mmol/L (ref 135–145)
Total Bilirubin: 0.8 mg/dL (ref 0.3–1.2)
Total Protein: 6.9 g/dL (ref 6.5–8.1)

## 2023-03-21 LAB — BPAM RBC
Blood Product Expiration Date: 202406182359
Blood Product Expiration Date: 202406192359
ISSUE DATE / TIME: 202405232239

## 2023-03-21 LAB — TYPE AND SCREEN
Unit division: 0
Unit division: 0

## 2023-03-21 LAB — CBC
HCT: 23.1 % — ABNORMAL LOW (ref 39.0–52.0)
Hemoglobin: 7.4 g/dL — ABNORMAL LOW (ref 13.0–17.0)
MCH: 31.9 pg (ref 26.0–34.0)
MCHC: 32 g/dL (ref 30.0–36.0)
MCV: 99.6 fL (ref 80.0–100.0)
Platelets: 132 10*3/uL — ABNORMAL LOW (ref 150–400)
RBC: 2.32 MIL/uL — ABNORMAL LOW (ref 4.22–5.81)
RDW: 16.6 % — ABNORMAL HIGH (ref 11.5–15.5)
WBC: 3.9 10*3/uL — ABNORMAL LOW (ref 4.0–10.5)
nRBC: 0 % (ref 0.0–0.2)

## 2023-03-21 LAB — PROTIME-INR
INR: 1.4 — ABNORMAL HIGH (ref 0.8–1.2)
Prothrombin Time: 17.2 seconds — ABNORMAL HIGH (ref 11.4–15.2)

## 2023-03-21 LAB — POC OCCULT BLOOD, ED: Fecal Occult Bld: NEGATIVE

## 2023-03-21 LAB — CBG MONITORING, ED: Glucose-Capillary: 167 mg/dL — ABNORMAL HIGH (ref 70–99)

## 2023-03-21 LAB — APTT: aPTT: 36 seconds (ref 24–36)

## 2023-03-21 LAB — PREPARE RBC (CROSSMATCH)

## 2023-03-21 LAB — GLUCOSE, CAPILLARY: Glucose-Capillary: 154 mg/dL — ABNORMAL HIGH (ref 70–99)

## 2023-03-21 MED ORDER — INSULIN ASPART 100 UNIT/ML IJ SOLN
0.0000 [IU] | INTRAMUSCULAR | Status: DC
  Administered 2023-03-21 – 2023-03-22 (×2): 1 [IU] via SUBCUTANEOUS

## 2023-03-21 MED ORDER — SODIUM CHLORIDE 0.9% IV SOLUTION: Freq: Once | INTRAVENOUS | Status: AC

## 2023-03-21 MED ORDER — ATORVASTATIN CALCIUM 80 MG PO TABS
80.0000 mg | ORAL_TABLET | Freq: Every day | ORAL | Status: DC
  Administered 2023-03-22 – 2023-03-23 (×2): 80 mg via ORAL
  Filled 2023-03-21 (×2): qty 1

## 2023-03-21 MED ORDER — ONDANSETRON HCL 4 MG/2ML IJ SOLN: 4.0000 mg | Freq: Four times a day (QID) | INTRAMUSCULAR | Status: DC | PRN

## 2023-03-21 MED ORDER — CARVEDILOL 25 MG PO TABS
25.0000 mg | ORAL_TABLET | Freq: Two times a day (BID) | ORAL | Status: DC
  Administered 2023-03-22 – 2023-03-23 (×2): 25 mg via ORAL
  Filled 2023-03-21 (×2): qty 1

## 2023-03-21 MED ORDER — SUCROFERRIC OXYHYDROXIDE 500 MG PO CHEW
500.0000 mg | CHEWABLE_TABLET | Freq: Three times a day (TID) | ORAL | Status: DC
  Administered 2023-03-22: 500 mg via ORAL
  Filled 2023-03-21 (×3): qty 1

## 2023-03-21 MED ORDER — ONDANSETRON HCL 4 MG PO TABS: 4.0000 mg | ORAL_TABLET | Freq: Four times a day (QID) | ORAL | Status: DC | PRN

## 2023-03-21 MED ORDER — POLYETHYLENE GLYCOL 3350 17 G PO PACK
17.0000 g | PACK | Freq: Every day | ORAL | Status: DC
  Administered 2023-03-22: 17 g via ORAL
  Filled 2023-03-21: qty 1

## 2023-03-21 MED ORDER — HYDROCODONE-ACETAMINOPHEN 5-325 MG PO TABS: 1.0000 | ORAL_TABLET | ORAL | Status: DC | PRN

## 2023-03-21 MED ORDER — SODIUM CHLORIDE 0.9% FLUSH: 3.0000 mL | INTRAVENOUS | Status: DC | PRN

## 2023-03-21 MED ORDER — SODIUM CHLORIDE 0.9% FLUSH
3.0000 mL | Freq: Two times a day (BID) | INTRAVENOUS | Status: DC
  Administered 2023-03-22 – 2023-03-23 (×4): 3 mL via INTRAVENOUS

## 2023-03-21 MED ORDER — SEVELAMER CARBONATE 800 MG PO TABS
800.0000 mg | ORAL_TABLET | Freq: Three times a day (TID) | ORAL | Status: DC
  Administered 2023-03-22 – 2023-03-23 (×2): 800 mg via ORAL
  Filled 2023-03-21 (×2): qty 1

## 2023-03-21 MED ORDER — PANTOPRAZOLE SODIUM 40 MG PO TBEC
40.0000 mg | DELAYED_RELEASE_TABLET | Freq: Every day | ORAL | Status: DC
  Administered 2023-03-22 – 2023-03-23 (×2): 40 mg via ORAL
  Filled 2023-03-21 (×2): qty 1

## 2023-03-21 MED ORDER — ACETAMINOPHEN 650 MG RE SUPP: 650.0000 mg | Freq: Four times a day (QID) | RECTAL | Status: DC | PRN

## 2023-03-21 MED ORDER — SODIUM CHLORIDE 0.9 % IV SOLN: 250.0000 mL | INTRAVENOUS | Status: DC | PRN

## 2023-03-21 MED ORDER — FINASTERIDE 5 MG PO TABS
5.0000 mg | ORAL_TABLET | Freq: Every day | ORAL | Status: DC
  Administered 2023-03-22 – 2023-03-23 (×2): 5 mg via ORAL
  Filled 2023-03-21 (×2): qty 1

## 2023-03-21 MED ORDER — ACETAMINOPHEN 325 MG PO TABS
650.0000 mg | ORAL_TABLET | Freq: Four times a day (QID) | ORAL | Status: DC | PRN
  Administered 2023-03-22: 650 mg via ORAL
  Filled 2023-03-21: qty 2

## 2023-03-21 MED ORDER — TAMSULOSIN HCL 0.4 MG PO CAPS
0.4000 mg | ORAL_CAPSULE | Freq: Every day | ORAL | Status: DC
  Administered 2023-03-22 (×2): 0.4 mg via ORAL
  Filled 2023-03-21 (×2): qty 1

## 2023-03-21 NOTE — Assessment & Plan Note (Signed)
Continue Lipitor 80 mg a day.

## 2023-03-21 NOTE — Assessment & Plan Note (Signed)
On hemodialysis.  Will need to notify nephrology the patient is being admitted ask ER to send a message to nephrology

## 2023-03-21 NOTE — ED Provider Notes (Addendum)
[facility]MENT AT [facility] Provider Note   CSN: [id number] Arrival date & time: 03/21/23  1506     History  Chief Complaint  Patient presents with   Near Syncope   Hematochezia    [facility] is a 72 y.o. male.  Patient is a 72 year old male with a history of end-stage renal disease on dialysis MWF, CHF, diabetes, hypertension, hyperlipidemia, chronically anticoagulated with Eliquis who is presenting today due to weakness and recurrent syncope.  Patient was seen on 03/14/2023 after having multiple syncopal events after being outside washing his car.  When he arrived to the emergency room he was feeling better and wanted to go home.  However at that visit it was found that he had a 2 g drop in his hemoglobin to 7.5.  At that time he was offered admission but wanted to go home was able to ambulate and felt fine.  However since going home he reports he is starting to feel worse.  Over this past week he has had multiple episodes of near syncope and syncope.  Always occurs when he tries to stand and walk.  When he gets up he becomes very lightheaded and does not get more than 10 to 15 feet and then starts feeling woozy and blacks out.  There is always been someone around when this has happened and he has not had any injury.  His last dialysis was yesterday and he received a full course.  He also reports yesterday after a bowel movement he noticed some blood in his stool which was bright red that caused him to need to wipe a lot but has not had a bowel movement today.  He reports a history of blood in his stool years ago when he still lived in [place] but has not had anything since.  He thinks his last colonoscopy was a few years ago and as far as he knows it was fine.  He denies any chest pain or shortness of breath.  He has been compliant with the medications he has but reports he ran out of several of his medications and has not had them refilled yet.  The  history is provided by the patient.  Near Syncope       Home Medications Prior to Admission medications   Medication Sig Start Date End Date Taking? Authorizing Provider  acetaminophen (TYLENOL) 650 MG CR tablet Take 1,300 mg by mouth at bedtime.    [provider]  Alcohol Swabs (B-D SINGLE USE SWABS REGULAR) PADS  04/22/20   [provider]  apixaban (ELIQUIS) 5 MG TABS tablet Take 1 tablet (5 mg total) by mouth 2 (two) times daily. 01/10/23   Patwardhan, Anabel Bene, MD  aspirin EC 81 MG tablet Take 1 tablet (81 mg total) by mouth daily. Swallow whole. 01/10/23   Patwardhan, Anabel Bene, MD  atorvastatin (LIPITOR) 80 MG tablet Take 1 tablet (80 mg total) by mouth daily. 03/20/22 01/10/23  Lanae Boast, MD  B Complex-C-Zn-Folic Acid (DIALYVITE 800 WITH ZINC) 0.8 MG TABS SMARTSIG:1 Tablet(s) By Mouth Every Evening 03/30/22   [provider]  Blood Glucose Monitoring Suppl (ACCU-CHEK GUIDE ME) w/Device KIT 1 Device by Does not apply route 4 (four) times daily -  before meals and at bedtime. 11/19/18   Deeann Saint, MD  Calcium Carbonate-Simethicone (ALKA-SELTZER HEARTBURN + GAS) 750-80 MG CHEW Chew 1 tablet by mouth 3 (three) times daily as needed (indigestion/heartburn.).    [provider]  carvedilol (COREG) 25 MG tablet Take 25 mg by mouth 2 (two) times daily. 09/04/21   [provider]  Continuous Blood Gluc Sensor (DEXCOM G6 SENSOR) MISC 1 Device by Does not apply route See admin instructions. Change every 10 days 01/31/21   Romero Belling, MD  Continuous Blood Gluc Sensor (DEXCOM G6 SENSOR) MISC 1 Device by Does not apply route See admin instructions. Change every 10 days 02/02/21   Romero Belling, MD  Cyanocobalamin (VITAMIN B-12) 5000 MCG TBDP Take 5,000 mcg by mouth in the morning.    [provider]  Dextromethorphan-guaiFENesin (CORICIDIN HBP CONGESTION/COUGH) 10-200 MG CAPS Take 2 capsules by mouth daily as needed (cough/congestion).    [provider]  furosemide (LASIX) 80 MG tablet Take 80 mg by mouth See admin instructions. Take 80 mg by mouth twice a day and additional 80 mg before bedtime as needed for swelling of the ankles    [provider]  gabapentin (NEURONTIN) 600 MG tablet Take 0.5 tablets (300 mg total) by mouth 2 (two) times daily. 08/02/20   Calvert Cantor, MD  GEBAUERS PAIN EASE AERO Apply topically. 03/30/22   [provider]  glucose 4 GM chewable tablet Chew 1 tablet by mouth as needed for low blood sugar.    [provider]  hydrALAZINE (APRESOLINE) 100 MG tablet Take 1 tablet (100 mg total) by mouth 3 (three) times daily. 01/02/23 02/01/23  Patwardhan, Anabel Bene, MD  isosorbide mononitrate (IMDUR) 60 MG 24 hr tablet Take 1 tablet (60 mg total) by mouth daily. Patient taking differently: Take 60 mg by mouth at bedtime. 08/03/20   Calvert Cantor, MD  ketorolac (ACULAR) 0.5 % ophthalmic solution Place 1 drop into both eyes daily as needed (inflamation). 11/22/20   [provider]  Lidocaine 4 % AERO Apply 1 spray topically as needed (prior to port being accessed.).    [provider]  lidocaine-prilocaine (EMLA) cream 1 application as needed (prior to port being accessed). 07/05/21   [provider]  linaclotide (LINZESS) 145 MCG CAPS capsule 1 capsule at least 30 minutes before the first meal of the day on an empty stomach Orally Once a day    [provider]  loratadine (CLARITIN) 10 MG tablet Take 10 mg by mouth daily as needed for allergies.    [provider]  losartan (COZAAR) 100 MG tablet Take 1 tablet (100 mg total) by mouth daily. 12/19/22 03/19/23  Patwardhan, Manish J, MD  NOVOLOG FLEXPEN 100 UNIT/ML FlexPen INJECT 3 UNITS INTO THE SKIN 3 (THREE) TIMES DAILY WITH MEALS. 08/31/20   Romero Belling, MD  ondansetron (ZOFRAN) 4 MG tablet Take 1 tablet (4 mg total) by mouth every 8 (eight) hours as needed for nausea. 08/02/20   Calvert Cantor, MD   pantoprazole (PROTONIX) 40 MG tablet Take 1 tablet (40 mg total) by mouth daily. 03/19/22 01/10/23  Lanae Boast, MD  polyethylene glycol powder (GLYCOLAX/MIRALAX) 17 GM/SCOOP powder Take 17 g by mouth in the morning, at noon, and at bedtime. One scoop in beverage of your choice with each meal. You may increase if you continue to be constipated and decrease if your stool becomes too loose. 02/27/23   Gailen Shelter, PA  rosuvastatin (CRESTOR) 10 MG tablet Take 10 mg by mouth daily. 09/14/22   [provider]  sevelamer carbonate (RENVELA) 800 MG tablet Take 800 mg by mouth with breakfast, with lunch, and with evening meal. 01/10/21   [provider]  tamsulosin (FLOMAX) 0.4 MG CAPS capsule Take 0.4 mg by mouth at bedtime. 11/16/19   [provider]  traMADol (ULTRAM) 50 MG tablet Take 1 tablet (50 mg total) by mouth every 12 (twelve) hours as needed for up to 8 doses. 03/19/22   Lanae Boast, MD  VELPHORO 500 MG chewable tablet Chew 500 mg by mouth 3 (three) times daily. 02/14/22   [provider]  vitamin E 1000 UNIT capsule Take 1,000 Units by mouth daily.    [provider]      Allergies    Patient has no known allergies.    Review of Systems   Review of Systems  Cardiovascular:  Positive for near-syncope.    Physical Exam Updated Vital Signs BP (!) 158/87   Pulse 68   Temp 97.7 F (36.5 C) (Oral)   Resp 18   SpO2 93%  Physical Exam Vitals and nursing note reviewed.  Constitutional:      General: He is not in acute distress.    Appearance: He is well-developed.  HENT:     Head: Normocephalic and atraumatic.  Eyes:     Pupils: Pupils are equal, round, and reactive to light.     Comments: Pale conjunctive a  Cardiovascular:     Rate and Rhythm: Normal rate and regular rhythm.     Heart sounds: Murmur heard.  Pulmonary:     Effort: Pulmonary effort is normal. No respiratory distress.     Breath sounds: Normal breath sounds. No wheezing or  rales.  Abdominal:     General: There is no distension.     Palpations: Abdomen is soft.     Tenderness: There is no abdominal tenderness. There is no guarding or rebound.  Genitourinary:    Comments: Several small hemorrhoids at the rectum but they are not inflamed or bleeding.  Stool is brown.  No rectal pain Musculoskeletal:        General: No tenderness. Normal range of motion.     Cervical back: Normal range of motion and neck supple.     Right lower leg: Edema present.     Left lower leg: Edema present.     Comments: 1+ edema bilateral ankles.  Dialysis graft in the left upper arm  Skin:    General: Skin is warm and dry.     Coloration: Skin is pale.     Findings: No erythema or rash.  Neurological:     Mental Status: He is alert and oriented to person, place, and time. Mental status is at baseline.  Psychiatric:        Mood and Affect: Mood normal.        Behavior: Behavior normal.     ED Results / Procedures / Treatments   Labs (all labs ordered are listed, but only abnormal results are displayed) Labs Reviewed  COMPREHENSIVE METABOLIC PANEL - Abnormal; Notable for the following components:      Result Value   Chloride 97 (*)    Glucose, Bld 125 (*)    Creatinine, Ser 5.17 (*)    Calcium 7.6 (*)    Albumin 3.2 (*)    AST 63 (*)    ALT 75 (*)    GFR, Estimated 11 (*)    All other components within normal limits  CBC - Abnormal; Notable for the following components:   WBC 3.9 (*)    RBC 2.32 (*)    Hemoglobin 7.4 (*)    HCT 23.1 (*)  RDW 16.6 (*)    Platelets 132 (*)    All other components within normal limits  PROTIME-INR  APTT  VITAMIN B12  FOLATE  IRON AND TIBC  FERRITIN  RETICULOCYTES  POC OCCULT BLOOD, ED  POC OCCULT BLOOD, ED  TYPE AND SCREEN  PREPARE RBC (CROSSMATCH)    EKG EKG Interpretation  Date/Time:  Thursday Mar 21 2023 17:51:26 EDT Ventricular Rate:  72 PR Interval:  223 QRS Duration: 105 QT Interval:  471 QTC  Calculation: 516 R Axis:   46 Text Interpretation: Sinus rhythm Prolonged PR interval Borderline T abnormalities, diffuse leads Prolonged QT interval No significant change since last tracing Confirmed by Gwyneth Sprout ([id number]) on 03/21/2023 6:30:31 PM  Radiology No results found.  Procedures Procedures    Medications Ordered in ED Medications  0.9 %  sodium chloride infusion (Manually program via Guardrails IV Fluids) ( Intravenous New Bag/Given 03/21/23 1819)    ED Course/ Medical Decision Making/ A&P                             Medical Decision Making Amount and/or Complexity of Data Reviewed Independent Historian:     Details: family External Data Reviewed: notes. Labs: ordered. Decision-making details documented in ED Course. ECG/medicine tests: ordered and independent interpretation performed. Decision-making details documented in ED Course.  Risk Prescription drug management. Decision regarding hospitalization.   Pt with multiple medical problems and comorbidities and presenting today with a complaint that caries a high risk for morbidity and mortality.  Your today due to weakness and recurrent syncope when he tries to stand and walk.  Patient denies any shortness of breath, infectious symptoms.  He does have some swelling in his lower extremities but he has been compliant with dialysis and reports that his dry weight has been the same.  Patient was seen last week and symptoms were similar but not as bad as they are now.  Patient was found to be anemic at that time with hemoglobin of 7.5.  Patient did report some rectal bleeding yesterday.  Concern for worsening anemia as the cause of his symptoms.  Also possibility for cardiac cause, abnormal electrolytes.  Patient is not hypotensive here is mentating well and has no focal deficits.  I independently interpreted patient's labs and CMP is consistent with end-stage renal disease without significant change.  Hypocalcemia with  calcium of 7.6, mild LFT elevation AST of 63 ALT of 75, CBC with persistent anemia with hemoglobin of 7.4 and platelet count of 132.  Based on patient's symptoms, recurrent syncope feel that he needs a blood transfusion hemoglobin was 10.9 3 weeks ago and based on past trend seems to be in the 9 range.  Concern for possible GI bleeding, patient also reports he bled a lot at dialysis yesterday and they had to stick his graft elsewhere.  He does take Eliquis chronically.  On rectal exam he does not have obvious signs of bleeding.  Hemoccult is pending.  Patient needs admission to the hospital.  Will consult the hospitalist.  Will transfuse 2 units of blood.  Discussed with the patient and his family member who was present at bedside.  They are comfortable with this plan.  Hemoccult neg today.  Spoke with the hospitalist who requested GI be made known that he was here due to his complaint of having rectal bleeding yesterday does have a history of internal hemorrhoids.  They we will plan on holding his  Eliquis.  Also informed renal as he will need dialysis tomorrow.  I independently interpreted patient's EKG which showed no acute changes.  CRITICAL CARE Performed by: Dalton Mille Total critical care time: 30 minutes Critical care time was exclusive of separately billable procedures and treating other patients. Critical care was necessary to treat or prevent imminent or life-threatening deterioration. Critical care was time spent personally by me on the following activities: development of treatment plan with patient and/or surrogate as well as nursing, discussions with consultants, evaluation of patient's response to treatment, examination of patient, obtaining history from patient or surrogate, ordering and performing treatments and interventions, ordering and review of laboratory studies, ordering and review of radiographic studies, pulse oximetry and re-evaluation of patient's  condition.        Final Clinical Impression(s) / ED Diagnoses Final diagnoses:  ESRD (end stage renal disease) (HCC)  Symptomatic anemia    Rx / DC Orders ED Discharge Orders     None         Gwyneth Sprout, MD 03/21/23 Mitzie Na, MD 03/21/23 [phone]

## 2023-03-21 NOTE — Assessment & Plan Note (Signed)
Order sliding scale hold p.o. medications 
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
## 2023-03-21 NOTE — Assessment & Plan Note (Signed)
Hold Eliquis for tonight given bright red blood per rectum restart Coreg when able to tolerate

## 2023-03-21 NOTE — Assessment & Plan Note (Addendum)
In the setting of anemia which is symptomatic.  Transfuse 2 units of packed red blood cells already started in ED follow CBC For completion obtain chest x-ray and echo

## 2023-03-21 NOTE — Subjective & Objective (Signed)
Patient presented from home with syncope for the past 3 days worse when standing He was seen last week for feeling somewhat weak.  Patient is on hemodialysis Monday Wednesday Friday last hemodialysis was on Wednesday Patient is chronic anemia hemoglobin at baseline around 10.  He denies melena but does report yesterday he has had large amount of red blood stool per rectum when he was trying to wipe.  Patient is on Eliquis for history of paroxysmal atrial fibrillation. He has had frequent syncopal episodes over the past 1 week were first on May 16 was while he was wiping down his car and was witnessed.  The When he was trying to walk up the stairs and his niece noted and called 911 on arrival EMS blood pressure 138/20.  Patient was taken to emergency department where he stated he wants to go home and did not want to be admitted after he went home he continued to have frequent episodes of feeling lightheaded every time he tried to walk a little bit He seems globin during the last visit dropped down to 7.5 Last colonoscopy was few years ago we have [facility] and showed internal hemorrhoids No hitting of the head  Patient did not have chest pain has been taking his medications which include both Eliquis and aspirin 81 mg

## 2023-03-21 NOTE — Assessment & Plan Note (Signed)
Allow permissive hypertension for right now 

## 2023-03-21 NOTE — H&P (Signed)
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 In the setting of anemia which is symptomatic.  Transfuse 2 units of packed red blood cells already started in ED follow CBC For completion obtain chest x-ray and echo  Prolonged QT interval - will monitor on tele avoid QT prolonging medications, rehydrate correct electrolytes   Other plan as per orders.  DVT prophylaxis:  SCD     Code Status:    Code Status: Prior FULL CODE  as per patient   I had  personally discussed CODE STATUS with patient   ACP none   Family Communication:   Family not at  Bedside     Diet  Diet Orders (From admission, onward)     Start     Ordered   03/21/23 1526  Diet NPO time specified  Diet effective now        03/21/23 1525            Disposition Plan:         To home once workup is complete and patient is stable   Following barriers for discharge:                                                          Anemia corrected                                                    Will need consultants to evaluate patient prior to discharge        Consult Orders  (From admission, onward)           Start     Ordered   03/21/23 1800  Consult to hospitalist  Pg sent by deloris  Once       Provider:  (Not yet assigned)  Question Answer Comment  Place call to: [facility]   Reason for Consult Admit      03/21/23 1759                               Consults called:    NOtified [facility]   Notified  Nephrology Dr.  Kathrene Bongo   Admission status:  ED Disposition     ED Disposition  Admit   Condition  --   Comment  Hospital Area: [facility] [[id number]]  Level of Care: Progressive [102]  Admit to Progressive based on following criteria: CARDIOVASCULAR & THORACIC of moderate stability with acute coronary syndrome symptoms/low risk myocardial infarction/hypertensive urgency/arrhythmias/heart failure potentially compromising stability and stable post cardiovascular intervention patients.  May admit patient to [facility] or [facility] if equivalent level of care is available:: No  Covid Evaluation: Asymptomatic - no recent exposure (last 10 days) testing not required  Diagnosis: Symptomatic anemia [[id number]]  Admitting Physician: Therisa Doyne [[id number]]  Attending Physician: Therisa Doyne [[id number]]  Certification:: I certify this patient will need inpatient services for at least 2 midnights  Estimated Length of  Stay: 2          inpatient     I Expect 2 midnight stay secondary to severity of patient's current illness need for inpatient interventions justified by the following:    Severe lab/radiological/exam abnormalities including:    Anemia, syncope and extensive comorbidities including: ESRD History of amputation Chronic anticoagulation  That are currently affecting medical management.   I expect  patient to be hospitalized for 2 midnights requiring inpatient medical care.  Patient is at high risk for adverse outcome (such as loss of life or disability) if not treated.  Indication for inpatient stay as follows:     Need for operative/procedural  intervention     Need for blood products    Level of care      progressive tele  indefinitely please discontinue once patient no longer qualifies COVID-19 Labs   Jalexus Brett 03/21/2023, 9:33 PM    [facility]     after 2 AM please page floor coverage PA If 7AM-7PM, please contact the day team taking care of the patient using [email or web address]

## 2023-03-21 NOTE — Assessment & Plan Note (Signed)
-   will monitor on tele avoid QT prolonging medications, rehydrate correct electrolytes  

## 2023-03-21 NOTE — Assessment & Plan Note (Signed)
Patient has history of internal hemorrhoids suspected secondary to that but he is progressively becoming more anemic with more symptomatic anemia. Will transfuse 2 units continue to follow CBC Monitor on telemetry

## 2023-03-21 NOTE — Assessment & Plan Note (Signed)
Transfuse and follow CBC.

## 2023-03-21 NOTE — Assessment & Plan Note (Signed)
Given blood loss will hold aspirin if blood pressure allows would restart Coreg 25 mg twice a day and continue Lipitor at 80 mg a day

## 2023-03-22 ENCOUNTER — Inpatient Hospital Stay (HOSPITAL_COMMUNITY): Payer: Medicare Other

## 2023-03-22 DIAGNOSIS — R55 Syncope and collapse: Secondary | ICD-10-CM

## 2023-03-22 DIAGNOSIS — Z7901 Long term (current) use of anticoagulants: Secondary | ICD-10-CM

## 2023-03-22 DIAGNOSIS — I2510 Atherosclerotic heart disease of native coronary artery without angina pectoris: Secondary | ICD-10-CM | POA: Diagnosis not present

## 2023-03-22 DIAGNOSIS — K648 Other hemorrhoids: Secondary | ICD-10-CM

## 2023-03-22 DIAGNOSIS — D649 Anemia, unspecified: Secondary | ICD-10-CM | POA: Diagnosis not present

## 2023-03-22 DIAGNOSIS — K649 Unspecified hemorrhoids: Secondary | ICD-10-CM

## 2023-03-22 DIAGNOSIS — Z992 Dependence on renal dialysis: Secondary | ICD-10-CM

## 2023-03-22 DIAGNOSIS — K625 Hemorrhage of anus and rectum: Secondary | ICD-10-CM | POA: Diagnosis not present

## 2023-03-22 LAB — ECHOCARDIOGRAM COMPLETE
AR max vel: 2.55 cm2
AV Area VTI: 2.64 cm2
AV Area mean vel: 2.5 cm2
AV Mean grad: 13 mmHg
AV Peak grad: 22 mmHg
Ao pk vel: 2.35 m/s
Area-P 1/2: 3.59 cm2
Est EF: 50
S' Lateral: 3.5 cm

## 2023-03-22 LAB — TYPE AND SCREEN
ABO/RH(D): A POS
Antibody Screen: NEGATIVE
Unit division: 0

## 2023-03-22 LAB — CBC
HCT: 25.4 % — ABNORMAL LOW (ref 39.0–52.0)
HCT: 28.4 % — ABNORMAL LOW (ref 39.0–52.0)
HCT: 26.6 % — ABNORMAL LOW (ref 39.0–52.0)
Hemoglobin: 8.4 g/dL — ABNORMAL LOW (ref 13.0–17.0)
Hemoglobin: 9.3 g/dL — ABNORMAL LOW (ref 13.0–17.0)
Hemoglobin: 8.5 g/dL — ABNORMAL LOW (ref 13.0–17.0)
MCH: 31.3 pg (ref 26.0–34.0)
MCH: 31.1 pg (ref 26.0–34.0)
MCH: 30.8 pg (ref 26.0–34.0)
MCHC: 33.1 g/dL (ref 30.0–36.0)
MCHC: 32.7 g/dL (ref 30.0–36.0)
MCHC: 32 g/dL (ref 30.0–36.0)
MCV: 94.8 fL (ref 80.0–100.0)
MCV: 95 fL (ref 80.0–100.0)
MCV: 96.4 fL (ref 80.0–100.0)
Platelets: 117 10*3/uL — ABNORMAL LOW (ref 150–400)
Platelets: 139 10*3/uL — ABNORMAL LOW (ref 150–400)
Platelets: 121 10*3/uL — ABNORMAL LOW (ref 150–400)
RBC: 2.68 MIL/uL — ABNORMAL LOW (ref 4.22–5.81)
RBC: 2.99 MIL/uL — ABNORMAL LOW (ref 4.22–5.81)
RBC: 2.76 MIL/uL — ABNORMAL LOW (ref 4.22–5.81)
RDW: 17.2 % — ABNORMAL HIGH (ref 11.5–15.5)
RDW: 17.2 % — ABNORMAL HIGH (ref 11.5–15.5)
RDW: 17.2 % — ABNORMAL HIGH (ref 11.5–15.5)
WBC: 3.5 10*3/uL — ABNORMAL LOW (ref 4.0–10.5)
WBC: 4.9 10*3/uL (ref 4.0–10.5)
WBC: 4.2 10*3/uL (ref 4.0–10.5)
nRBC: 0 % (ref 0.0–0.2)
nRBC: 0 % (ref 0.0–0.2)
nRBC: 0 % (ref 0.0–0.2)

## 2023-03-22 LAB — IRON AND TIBC
Iron: 44 ug/dL — ABNORMAL LOW (ref 45–182)
Saturation Ratios: 25 % (ref 17.9–39.5)
TIBC: 174 ug/dL — ABNORMAL LOW (ref 250–450)
UIBC: 130 ug/dL

## 2023-03-22 LAB — COMPREHENSIVE METABOLIC PANEL
ALT: 65 U/L — ABNORMAL HIGH (ref 0–44)
AST: 44 U/L — ABNORMAL HIGH (ref 15–41)
Albumin: 3 g/dL — ABNORMAL LOW (ref 3.5–5.0)
Alkaline Phosphatase: 95 U/L (ref 38–126)
Anion gap: 15 (ref 5–15)
BUN: 27 mg/dL — ABNORMAL HIGH (ref 8–23)
CO2: 29 mmol/L (ref 22–32)
Calcium: 7.6 mg/dL — ABNORMAL LOW (ref 8.9–10.3)
Chloride: 95 mmol/L — ABNORMAL LOW (ref 98–111)
Creatinine, Ser: 5.77 mg/dL — ABNORMAL HIGH (ref 0.61–1.24)
GFR, Estimated: 10 mL/min — ABNORMAL LOW (ref >60)
Glucose, Bld: 83 mg/dL (ref 70–99)
Potassium: 3.2 mmol/L — ABNORMAL LOW (ref 3.5–5.1)
Sodium: 139 mmol/L (ref 135–145)
Total Bilirubin: 0.9 mg/dL (ref 0.3–1.2)
Total Protein: 6.3 g/dL — ABNORMAL LOW (ref 6.5–8.1)

## 2023-03-22 LAB — BPAM RBC
Blood Product Expiration Date: 202405252359
Blood Product Expiration Date: 202406192359
ISSUE DATE / TIME: 202405231849
Unit Type and Rh: 600
Unit Type and Rh: 6200

## 2023-03-22 LAB — PHOSPHORUS: Phosphorus: 4.1 mg/dL (ref 2.5–4.6)

## 2023-03-22 LAB — RETICULOCYTES
Immature Retic Fract: 14.2 % (ref 2.3–15.9)
RBC.: 2.66 MIL/uL — ABNORMAL LOW (ref 4.22–5.81)
Retic Count, Absolute: 63.9 10*3/uL (ref 19.0–186.0)
Retic Ct Pct: 2.4 % (ref 0.4–3.1)

## 2023-03-22 LAB — FERRITIN: Ferritin: 931 ng/mL — ABNORMAL HIGH (ref 24–336)

## 2023-03-22 LAB — GLUCOSE, CAPILLARY
Glucose-Capillary: 88 mg/dL (ref 70–99)
Glucose-Capillary: 96 mg/dL (ref 70–99)
Glucose-Capillary: 159 mg/dL — ABNORMAL HIGH (ref 70–99)
Glucose-Capillary: 96 mg/dL (ref 70–99)

## 2023-03-22 LAB — HEPATITIS B SURFACE ANTIGEN: Hepatitis B Surface Ag: NONREACTIVE

## 2023-03-22 LAB — PREALBUMIN: Prealbumin: 24 mg/dL (ref 18–38)

## 2023-03-22 LAB — VITAMIN B12: Vitamin B-12: 853 pg/mL (ref 180–914)

## 2023-03-22 LAB — CK: Total CK: 127 U/L (ref 49–397)

## 2023-03-22 LAB — MAGNESIUM: Magnesium: 2.1 mg/dL (ref 1.7–2.4)

## 2023-03-22 LAB — TSH: TSH: 2.044 u[IU]/mL (ref 0.350–4.500)

## 2023-03-22 LAB — FOLATE: Folate: 13.3 ng/mL (ref >5.9)

## 2023-03-22 MED ORDER — HYDROCORTISONE ACETATE 25 MG RE SUPP
25.0000 mg | Freq: Two times a day (BID) | RECTAL | Status: DC
  Filled 2023-03-22 (×4): qty 1

## 2023-03-22 MED ORDER — HEPARIN SODIUM (PORCINE) 1000 UNIT/ML DIALYSIS: 1000.0000 [IU] | INTRAMUSCULAR | Status: DC | PRN

## 2023-03-22 MED ORDER — ALTEPLASE 2 MG IJ SOLR: 2.0000 mg | Freq: Once | INTRAMUSCULAR | Status: DC | PRN

## 2023-03-22 MED ORDER — POTASSIUM CHLORIDE CRYS ER 20 MEQ PO TBCR
40.0000 meq | EXTENDED_RELEASE_TABLET | Freq: Once | ORAL | Status: AC
  Administered 2023-03-22: 40 meq via ORAL
  Filled 2023-03-22: qty 2

## 2023-03-22 MED ORDER — ISOSORBIDE MONONITRATE ER 60 MG PO TB24
60.0000 mg | ORAL_TABLET | Freq: Every day | ORAL | Status: DC
  Administered 2023-03-22 – 2023-03-23 (×2): 60 mg via ORAL
  Filled 2023-03-22 (×2): qty 1

## 2023-03-22 MED ORDER — LIDOCAINE HCL (PF) 1 % IJ SOLN: 5.0000 mL | INTRAMUSCULAR | Status: DC | PRN

## 2023-03-22 MED ORDER — LOSARTAN POTASSIUM 50 MG PO TABS
100.0000 mg | ORAL_TABLET | Freq: Every day | ORAL | Status: DC
  Administered 2023-03-22 – 2023-03-23 (×2): 100 mg via ORAL
  Filled 2023-03-22 (×2): qty 2

## 2023-03-22 MED ORDER — CHLORHEXIDINE GLUCONATE CLOTH 2 % EX PADS
6.0000 | MEDICATED_PAD | Freq: Every day | CUTANEOUS | Status: DC
  Administered 2023-03-22 – 2023-03-23 (×2): 6 via TOPICAL

## 2023-03-22 MED ORDER — POLYETHYLENE GLYCOL 3350 17 G PO PACK
17.0000 g | PACK | Freq: Two times a day (BID) | ORAL | Status: DC
  Filled 2023-03-22: qty 1

## 2023-03-22 MED ORDER — LIDOCAINE-PRILOCAINE 2.5-2.5 % EX CREA: 1.0000 | TOPICAL_CREAM | CUTANEOUS | Status: DC | PRN

## 2023-03-22 MED ORDER — DOXERCALCIFEROL 4 MCG/2ML IV SOLN: 5.0000 ug | INTRAVENOUS | Status: DC

## 2023-03-22 MED ORDER — PENTAFLUOROPROP-TETRAFLUOROETH EX AERO: 1.0000 | INHALATION_SPRAY | CUTANEOUS | Status: DC | PRN

## 2023-03-22 NOTE — Progress Notes (Signed)
Pt had a 24-beat run NSVT. Pt is asymptomatic, Aox4, BP: 171/73, HR: 63, RR: 17, O2 sat: 98%. Pierre Bali, MD made aware
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
## 2023-03-22 NOTE — Progress Notes (Signed)
Received patient in bed to unit.  Alert and oriented.  Informed consent signed and in chart.   TX duration: 3.5 hours  Patient tolerated well.  Transported back to the room  Alert, without acute distress.  Hand-off given to patient's nurse.   Access used: fistula Access issues: none  Total UF removed: 2000 ml Medication(s) given: none Post HD VS: 166/77 Post HD weight: 97.0 kg     03/22/23 2030  Vitals  Temp 98.5 F (36.9 C)  Temp Source Oral  BP (!) 174/79  MAP (mmHg) 106  BP Location Right Arm  BP Method Automatic  Patient Position (if appropriate) Lying  Pulse Rate 66  Pulse Rate Source Monitor  ECG Heart Rate 68  Resp 15  Oxygen Therapy  SpO2 96 %  O2 Device Nasal Cannula  O2 Flow Rate (L/min) 2 L/min  Patient Activity (if Appropriate) In bed  Pulse Oximetry Type Continuous  Post Treatment  Dialyzer Clearance Lightly streaked  Duration of HD Treatment -hour(s) 3.5 hour(s)  Hemodialysis Intake (mL) 0 mL  Liters Processed 97.6  Fluid Removed (mL) 2000 mL  Tolerated HD Treatment Yes  Post-Hemodialysis Comments HD tx completed as expectred, tolerated, pt is stable.  AVG/AVF Arterial Site Held (minutes) 10 minutes  AVG/AVF Venous Site Held (minutes) 10 minutes  Note  Observations pt is in bed resting, alert, oriented, verbally responsive, and stable  Fistula / Graft Right Upper arm  No placement date or time found.   Orientation: Right  Access Location: Upper arm  Site Condition No complications  Fistula / Graft Assessment Present;Thrill;Bruit  Status Deaccessed  Drainage Description None

## 2023-03-22 NOTE — Procedures (Signed)
HD Note:  Some information was entered later than the data was gathered due to patient care needs. The stated time with the data is accurate.  Received patient in bed to unit.  Alert and oriented.  Informed consent signed and in chart.    Hand-off given to oncoming dialysis nurse.   Access used: Left upper arm fistula Access issues: No issues with dialysis     [facility]

## 2023-03-22 NOTE — TOC CM/SW Note (Signed)
Transition of Care [facility]) - Inpatient Brief Assessment   Patient Details  Name: William Webb MRN: [id number] Date of Birth: 12-16-1950  Transition of Care [facility]) CM/SW Contact:    Mearl Latin, LCSW Phone Number: 03/22/2023, 4:42 PM   Clinical Narrative: Patient arrived from home on OP Dialysis at [facility]. No Transitions of Care Needs at this time but will continue to follow. Please place [facility] consult if needs arise.    Transition of Care Asessment: Insurance and Status: Insurance coverage has been reviewed Patient has primary care physician: Yes Home environment has been reviewed: From home Prior level of function:: Independent Prior/Current Home Services: No current home services Social Determinants of Health Reivew: SDOH reviewed no interventions necessary Readmission risk has been reviewed: Yes Transition of care needs: no transition of care needs at this time

## 2023-03-22 NOTE — Progress Notes (Signed)
PROGRESS NOTE        PATIENT DETAILS Name: William Webb [facility] Age: 73 y.o. Sex: male Date of Birth: 1951-08-18 Admit Date: 03/21/2023 Admitting Physician Therisa Doyne, MD [id number], Theone Murdoch, MD  Brief Summary: Patient is a 72 y.o.  male with history of ESRD on HD MWF, PAF on Eliquis, HTN, HLD, DM-2, internal hemorrhoids-who presented with 4 episodes of hematochezia on Saturday, and multiple syncopal/presyncopal episodes.  Significant events: 5/23>> admit to [facility]  Significant studies: 5/23>> CXR: No PNA  Significant microbiology data: None  Procedures: None  Consults: Nephrology GI  Subjective: Lying comfortably in bed-denies any chest pain or shortness of breath.  Has not had any hematochezia since this past Saturday.  Acknowledges having numerous presyncopal/syncopal episodes-but claims that this is mostly followed by changing position-gets dizzy and then almost passes out.  Objective: Vitals: Blood pressure (!) 173/77, pulse 67, temperature 98.1 F (36.7 C), temperature source Oral, resp. rate 12, SpO2 96 %.   Exam: Gen Exam:Alert awake-not in any distress HEENT:atraumatic, normocephalic Chest: B/L clear to auscultation anteriorly CVS:S1S2 regular Abdomen:soft non tender, non distended Extremities:no edema Neurology: Non focal Skin: no rash  Pertinent Labs/Radiology:    Latest Ref Rng & Units 03/22/2023    4:18 AM 03/21/2023    3:32 PM 03/14/2023    6:45 PM  CBC  WBC 4.0 - 10.5 K/uL 3.5  3.9  5.0   Hemoglobin 13.0 - 17.0 g/dL 8.4  7.4  7.5   Hematocrit 39.0 - 52.0 % 25.4  23.1  23.4   Platelets 150 - 400 K/uL 117  132  114     Lab Results  Component Value Date   NA 139 03/22/2023   K 3.2 (L) 03/22/2023   CL 95 (L) 03/22/2023   CO2 29 03/22/2023     Assessment/Plan: Lower GI bleeding with acute blood loss anemia Suspect hemorrhoidal bleed-no further bleeding since this past Saturday Transfused 2 units on  admission-hemoglobin currently stable Start Anusol suppository If no further bleeding-doubt further workup needed-as recent endoscopy did not show any major abnormalities Appreciate GI input.  Syncope/presyncope High suspicion for orthostatic mechanism (GI bleed-gets dizzy/lightheaded when he stands up) Check orthostatic vital signs Telemonitoring Await echo  ESRD on HD MWF Nephrology consulted  CAD No anginal symptoms Aspirin held given GI loss Continue beta-blocker/statin  PAF Telemetry monitoring Beta-blocker Eliquis remains on hold  Mildly prolonged QTc Repeat twelve-lead EKG tomorrow morning Attempt to keep K> 4, Mg> 2  HTN BP on the higher side Continue Coreg Restart losartan/Imdur  DM-2 CBGs on SSI  Recent Labs    03/21/23 2347 03/22/23 0343 03/22/23 0828  GLUCAP 154* 88 96    BMI: Estimated body mass index is 23.75 kg/m as calculated from the following:   Height as of 03/14/23: 6\' 3"  (1.905 m).   Weight as of 03/14/23: 86.2 kg.   Code status:   Code Status: Full Code   DVT Prophylaxis: SCDs Start: 03/22/23 0000    Family Communication: None at bedside   Disposition Plan: Status is: Inpatient Remains inpatient appropriate because: Severity of illness   Planned Discharge Destination:Home   Diet: Diet Order             Diet full liquid Room service appropriate? Yes; Fluid consistency: Thin  Diet effective now  Antimicrobial agents: Anti-infectives (From admission, onward)    None        MEDICATIONS: Scheduled Meds:  atorvastatin  80 mg Oral Daily   carvedilol  25 mg Oral BID WC   Chlorhexidine Gluconate Cloth  6 each Topical Q0600   finasteride  5 mg Oral Daily   hydrocortisone  25 mg Rectal BID   insulin aspart  0-6 Units Subcutaneous Q4H   pantoprazole  40 mg Oral Daily   polyethylene glycol  17 g Oral Daily   sevelamer carbonate  800 mg Oral TID with meals   sodium chloride flush  3 mL  Intravenous Q12H   sucroferric oxyhydroxide  500 mg Oral TID WC   tamsulosin  0.4 mg Oral QHS   Continuous Infusions:  sodium chloride     PRN Meds:.sodium chloride, acetaminophen **OR** acetaminophen, alteplase, heparin, HYDROcodone-acetaminophen, lidocaine (PF), lidocaine-prilocaine, ondansetron **OR** ondansetron (ZOFRAN) IV, pentafluoroprop-tetrafluoroeth, sodium chloride flush   I have personally reviewed following labs and imaging studies  LABORATORY DATA: CBC: Recent Labs  Lab 03/21/23 1532 03/22/23 0418  WBC 3.9* 3.5*  HGB 7.4* 8.4*  HCT 23.1* 25.4*  MCV 99.6 94.8  PLT 132* 117*    Basic Metabolic Panel: Recent Labs  Lab 03/21/23 1532 03/22/23 0418  NA 139 139  K 3.5 3.2*  CL 97* 95*  CO2 30 29  GLUCOSE 125* 83  BUN 23 27*  CREATININE 5.17* 5.77*  CALCIUM 7.6* 7.6*  MG  --  2.1  PHOS  --  4.1    GFR: Estimated Creatinine Clearance: 13.6 mL/min (A) (by C-G formula based on SCr of 5.77 mg/dL (H)).  Liver Function Tests: Recent Labs  Lab 03/21/23 1532 03/22/23 0418  AST 63* 44*  ALT 75* 65*  ALKPHOS 110 95  BILITOT 0.8 0.9  PROT 6.9 6.3*  ALBUMIN 3.2* 3.0*   No results for input(s): "LIPASE", "AMYLASE" in the last 168 hours. No results for input(s): "AMMONIA" in the last 168 hours.  Coagulation Profile: Recent Labs  Lab 03/21/23 1754  INR 1.4*    Cardiac Enzymes: Recent Labs  Lab 03/22/23 0418  CKTOTAL 127    BNP (last 3 results) No results for input(s): "PROBNP" in the last 8760 hours.  Lipid Profile: No results for input(s): "CHOL", "HDL", "LDLCALC", "TRIG", "CHOLHDL", "LDLDIRECT" in the last 72 hours.  Thyroid Function Tests: Recent Labs    03/22/23 0418  TSH 2.044    Anemia Panel: Recent Labs    03/22/23 0418  VITAMINB12 853  FOLATE 13.3  FERRITIN 931*  TIBC 174*  IRON 44*  RETICCTPCT 2.4    Urine analysis:    Component Value Date/Time   COLORURINE YELLOW 02/04/2021 0918   APPEARANCEUR CLEAR 02/04/2021  0918   LABSPEC 1.012 02/04/2021 0918   PHURINE 5.0 02/04/2021 0918   GLUCOSEU 50 (A) 02/04/2021 0918   HGBUR NEGATIVE 02/04/2021 0918   BILIRUBINUR NEGATIVE 02/04/2021 0918   KETONESUR NEGATIVE 02/04/2021 0918   PROTEINUR >=300 (A) 02/04/2021 0918   NITRITE NEGATIVE 02/04/2021 0918   LEUKOCYTESUR NEGATIVE 02/04/2021 0918    Sepsis Labs: Lactic Acid, Venous    Component Value Date/Time   LATICACIDVEN 1.1 03/12/2022 2202    MICROBIOLOGY: No results found for this or any previous visit (from the past 240 hour(s)).  RADIOLOGY STUDIES/RESULTS: DG CHEST PORT 1 VIEW  Result Date: 03/21/2023 CLINICAL DATA:  Near syncope EXAM: PORTABLE CHEST 1 VIEW COMPARISON:  03/14/2023 FINDINGS: Cardiomegaly with aortic atherosclerosis. Mild left pleural thickening as before. No  consolidation or pneumothorax. Aortic atherosclerosis. Multiple metallic densities to the left of the spine. IMPRESSION: No active disease. Cardiomegaly. Electronically Signed   By: Jasmine Pang M.D.   On: 03/21/2023 19:25     LOS: 1 day   Jeoffrey Massed, MD  [facility]    To contact the attending provider between 7A-7P or the covering provider during after hours 7P-7A, please log into the web site [email or web address] and access using universal [place] password for that web site. If you do not have the password, please call the hospital operator.  03/22/2023, 11:11 AM
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
## 2023-03-22 NOTE — Progress Notes (Signed)
Pt receives out-pt HD at [facility] on MWF. Will assist as needed.   Jacob Chamblee Renal Navigator [phone] 

## 2023-03-23 DIAGNOSIS — D649 Anemia, unspecified: Secondary | ICD-10-CM | POA: Diagnosis not present

## 2023-03-23 DIAGNOSIS — I10 Essential (primary) hypertension: Secondary | ICD-10-CM | POA: Diagnosis not present

## 2023-03-23 DIAGNOSIS — K625 Hemorrhage of anus and rectum: Secondary | ICD-10-CM | POA: Diagnosis not present

## 2023-03-23 DIAGNOSIS — N186 End stage renal disease: Secondary | ICD-10-CM | POA: Diagnosis not present

## 2023-03-23 LAB — GLUCOSE, CAPILLARY
Glucose-Capillary: 116 mg/dL — ABNORMAL HIGH (ref 70–99)
Glucose-Capillary: 116 mg/dL — ABNORMAL HIGH (ref 70–99)
Glucose-Capillary: 147 mg/dL — ABNORMAL HIGH (ref 70–99)

## 2023-03-23 LAB — RENAL FUNCTION PANEL
Albumin: 2.8 g/dL — ABNORMAL LOW (ref 3.5–5.0)
Anion gap: 9 (ref 5–15)
BUN: 19 mg/dL (ref 8–23)
CO2: 30 mmol/L (ref 22–32)
Calcium: 7.8 mg/dL — ABNORMAL LOW (ref 8.9–10.3)
Chloride: 95 mmol/L — ABNORMAL LOW (ref 98–111)
Creatinine, Ser: 4.24 mg/dL — ABNORMAL HIGH (ref 0.61–1.24)
GFR, Estimated: 14 mL/min — ABNORMAL LOW (ref >60)
Glucose, Bld: 119 mg/dL — ABNORMAL HIGH (ref 70–99)
Phosphorus: 3 mg/dL (ref 2.5–4.6)
Potassium: 4.3 mmol/L (ref 3.5–5.1)
Sodium: 134 mmol/L — ABNORMAL LOW (ref 135–145)

## 2023-03-23 LAB — HEMOGLOBIN A1C
Hgb A1c MFr Bld: 6 % — ABNORMAL HIGH (ref 4.8–5.6)
Mean Plasma Glucose: 126 mg/dL

## 2023-03-23 LAB — CBC
HCT: 26.1 % — ABNORMAL LOW (ref 39.0–52.0)
Hemoglobin: 8.5 g/dL — ABNORMAL LOW (ref 13.0–17.0)
MCH: 31.1 pg (ref 26.0–34.0)
MCHC: 32.6 g/dL (ref 30.0–36.0)
MCV: 95.6 fL (ref 80.0–100.0)
Platelets: 113 10*3/uL — ABNORMAL LOW (ref 150–400)
RBC: 2.73 MIL/uL — ABNORMAL LOW (ref 4.22–5.81)
RDW: 16.9 % — ABNORMAL HIGH (ref 11.5–15.5)
WBC: 4.1 10*3/uL (ref 4.0–10.5)
nRBC: 0 % (ref 0.0–0.2)

## 2023-03-23 LAB — HEPATITIS B SURFACE ANTIBODY, QUANTITATIVE: Hep B S AB Quant (Post): 1726 m[IU]/mL (ref >9.9)

## 2023-03-23 LAB — MAGNESIUM: Magnesium: 1.8 mg/dL (ref 1.7–2.4)

## 2023-03-23 MED ORDER — HYDROCORTISONE ACETATE 25 MG RE SUPP: 25.0000 mg | Freq: Two times a day (BID) | RECTAL | 1 refills | Status: DC

## 2023-03-23 NOTE — Plan of Care (Signed)
[facility] Patient Discharge Orders- [facility] CLINIC: [place]  Patient's name: Quaron Barrentine [facility] Admit/DC Dates: 03/21/2023 - 03/23/2023  Discharge Diagnoses: ABLA/GIB -secondary to hemorrhoids    Aranesp: Given: No   Date and amount of last dose: --  Last Hgb: 8.5 PRBC's Given: Yes Date/# of units: 5/23 --2 units prbcs  ESA dose for discharge: mircera 150 mcg IV q 2 weeks (due 5/27) IV Iron dose at discharge: None  Heparin change: No change  EDW Change: No change but may need EDW increase. Will defer to outpatient provider   [place] Change: No change  Access intervention/Change: None Details:  Hectorol/Calcitriol change: No change  Discharge Labs: Calcium 7.8 Phosphorus 3.0 Albumin 2.8 K+ 4.3  IV Antibiotics: N/A Details:  On Coumadin?: No Last INR: Next INR: Managed By:   OTHER/APPTS/LAB ORDERS:    D/C Meds to be reconciled by nurse after every discharge.  Completed By: Tomasa Blase PA-C [facility] 03/23/2023,1:36 PM   Reviewed by: MD:______ RN_______

## 2023-03-23 NOTE — Discharge Summary (Signed)
PATIENT DETAILS Name: [facility] Age: 72 y.o. Sex: male Date of Birth: 1950-12-26 MRN: [id number]. Admitting Physician: Therisa Doyne, MD GEX:[id number], Theone Murdoch, MD  Admit Date: 03/21/2023 Discharge date: 03/23/2023  Recommendations for Outpatient Follow-up:  Follow up with PCP in 1-2 weeks Please obtain CMP/CBC in one week  Admitted From:  Home  Disposition: Home   Discharge Condition: good  CODE STATUS:   Code Status: Full Code   Diet recommendation:  Diet Order             Diet - low sodium heart healthy           Diet full liquid Room service appropriate? Yes; Fluid consistency: Thin  Diet effective now                    Brief Summary: Patient is a 72 y.o.  male with history of ESRD on HD MWF, PAF on Eliquis, HTN, HLD, DM-2, internal hemorrhoids-who presented with 4 episodes of hematochezia on Saturday, and multiple syncopal/presyncopal episodes.   Significant events: 5/23>> admit to [facility]   Significant studies: 5/23>> CXR: No PNA   Significant microbiology data: None   Procedures: None   Consults: Nephrology GI  Brief Hospital Course: Lower GI bleeding with acute blood loss anemia Suspect hemorrhoidal bleed-no further bleeding since this past Saturday Transfused 2 units on admission-hemoglobin currently stable Continue Anusol suppository on discharge x 5 days total Evaluated by GI-no further workup recommended-GI will arrange for outpatient follow-up.   Syncope/presyncope High suspicion for orthostatic mechanism (GI bleed-gets dizzy/lightheaded when he stands up) Telemetry stable without any major arrhythmias, echo with stable EF Now able to ambulate without any major issues-s/p PRBC transfusion.   ESRD on HD MWF Nephrology followed only-resume usual outpatient schedule.   CAD/PAD No anginal symptoms Continue beta-blocker/statin/Plavix   PAF Beta-blocker Eliquis initially held-will be resumed on discharge.   Mildly  prolonged QTc Resolved-Per EKG repeated on 5/25.   HTN Overall better-continue Coreg/losartan/Imdur PCP/primary cardiologist to optimize.   DM-2 Prior insulin regimen.  BMI: Estimated body mass index is 23.75 kg/m as calculated from the following:   Height as of 03/14/23: 6\' 3"  (1.905 m).   Weight as of 03/14/23: 86.2 kg.    Discharge Diagnoses:  Principal Problem:   Symptomatic anemia Active Problems:   Essential hypertension   Type 2 diabetes mellitus with stage 3 chronic kidney disease, with long-term current use of insulin (HCC)   CAD (coronary artery disease)   Syncope   Hyperlipidemia associated with type 2 diabetes mellitus (HCC)   Paroxysmal atrial fibrillation (HCC)   ESRD (end stage renal disease) (HCC)   Bright red blood per rectum   Prolonged QT interval   Hemorrhoids   Internal hemorrhoids   Discharge Instructions:  Activity:  As tolerated   Discharge Instructions     Call MD for:   Complete by: As directed    Hematochezia   Diet - low sodium heart healthy   Complete by: As directed    Discharge instructions   Complete by: As directed    Follow with Primary MD  Irven Coe, MD in 1-2 weeks  Please get a complete blood count and chemistry panel checked by your Primary MD at your next visit, and again as instructed by your Primary MD.  Get Medicines reviewed and adjusted: Please take all your medications with you for your next visit with your Primary MD  Laboratory/radiological data: Please request your Primary MD to go over all  hospital tests and procedure/radiological results at the follow up, please ask your Primary MD to get all Hospital records sent to his/her office.  In some cases, they will be blood work, cultures and biopsy results pending at the time of your discharge. Please request that your primary care M.D. follows up on these results.  Also Note the following: If you experience worsening of your admission symptoms, develop shortness  of breath, life threatening emergency, suicidal or homicidal thoughts you must seek medical attention immediately by calling 911 or calling your MD immediately  if symptoms less severe.  You must read complete instructions/literature along with all the possible adverse reactions/side effects for all the Medicines you take and that have been prescribed to you. Take any new Medicines after you have completely understood and accpet all the possible adverse reactions/side effects.   Do not drive when taking Pain medications or sleeping medications (Benzodaizepines)  Do not take more than prescribed Pain, Sleep and Anxiety Medications. It is not advisable to combine anxiety,sleep and pain medications without talking with your primary care practitioner  Special Instructions: If you have smoked or chewed Tobacco  in the last 2 yrs please stop smoking, stop any regular Alcohol  and or any Recreational drug use.  Wear Seat belts while driving.  Please note: You were cared for by a hospitalist during your hospital stay. Once you are discharged, your primary care physician will handle any further medical issues. Please note that NO REFILLS for any discharge medications will be authorized once you are discharged, as it is imperative that you return to your primary care physician (or establish a relationship with a primary care physician if you do not have one) for your post hospital discharge needs so that they can reassess your need for medications and monitor your lab values.   Increase activity slowly   Complete by: As directed       Allergies as of 03/23/2023   No Known Allergies      Medication List     STOP taking these medications    aspirin EC 81 MG tablet   ondansetron 4 MG tablet Commonly known as: ZOFRAN   pantoprazole 40 MG tablet Commonly known as: Protonix   traMADol 50 MG tablet Commonly known as: Ultram       TAKE these medications    Accu-Chek Guide Me w/Device Kit 1  Device by Does not apply route 4 (four) times daily -  before meals and at bedtime.   acetaminophen 650 MG CR tablet Commonly known as: TYLENOL Take 1,300 mg by mouth at bedtime.   Alka-Seltzer Heartburn + Gas 750-80 MG Chew Generic drug: Calcium Carbonate-Simethicone Chew 1 tablet by mouth 3 (three) times daily as needed (indigestion/heartburn.).   amLODipine 5 MG tablet Commonly known as: NORVASC Take 5 mg by mouth daily.   apixaban 5 MG Tabs tablet Commonly known as: ELIQUIS Take 1 tablet (5 mg total) by mouth 2 (two) times daily.   atorvastatin 80 MG tablet Commonly known as: LIPITOR Take 1 tablet (80 mg total) by mouth daily.   B-D SINGLE USE SWABS REGULAR Pads   carvedilol 25 MG tablet Commonly known as: COREG Take 25 mg by mouth 2 (two) times daily.   clopidogrel 75 MG tablet Commonly known as: PLAVIX Take 75 mg by mouth daily.   Coricidin HBP Congestion/Cough 10-200 MG Caps Generic drug: Dextromethorphan-guaiFENesin Take 2 capsules by mouth daily as needed (cough/congestion).   Dexcom G6 Sensor Misc 1 Device by  Does not apply route See admin instructions. Change every 10 days   Dexcom G6 Sensor Misc 1 Device by Does not apply route See admin instructions. Change every 10 days   DIALYVITE 800 WITH ZINC 0.8 MG Tabs SMARTSIG:1 Tablet(s) By Mouth Every Evening   finasteride 5 MG tablet Commonly known as: PROSCAR Take 5 mg by mouth daily.   furosemide 80 MG tablet Commonly known as: LASIX Take 80 mg by mouth See admin instructions. Take 80 mg by mouth twice a day and additional 80 mg before bedtime as needed for swelling of the ankles   gabapentin 600 MG tablet Commonly known as: NEURONTIN Take 0.5 tablets (300 mg total) by mouth 2 (two) times daily.   Gebauers Pain Ease Aero Generic drug: pentafluoroprop-tetrafluoroeth Apply topically.   glucose 4 GM chewable tablet Chew 1 tablet by mouth as needed for low blood sugar.   hydrALAZINE 100 MG  tablet Commonly known as: APRESOLINE Take 1 tablet (100 mg total) by mouth 3 (three) times daily.   hydrocortisone 25 MG suppository Commonly known as: ANUSOL-HC Place 1 suppository (25 mg total) rectally 2 (two) times daily.   isosorbide mononitrate 60 MG 24 hr tablet Commonly known as: IMDUR Take 1 tablet (60 mg total) by mouth daily.   ketorolac 0.5 % ophthalmic solution Commonly known as: ACULAR Place 1 drop into both eyes daily as needed (inflamation).   Lidocaine 4 % Aero Apply 1 spray topically as needed (prior to port being accessed.).   lidocaine-prilocaine cream Commonly known as: EMLA 1 application as needed (prior to port being accessed).   Linzess 145 MCG Caps capsule Generic drug: linaclotide Take 145 mcg by mouth daily before breakfast.   loratadine 10 MG tablet Commonly known as: CLARITIN Take 10 mg by mouth daily as needed for allergies.   losartan 100 MG tablet Commonly known as: COZAAR Take 1 tablet (100 mg total) by mouth daily. What changed: when to take this   NovoLOG FlexPen 100 UNIT/ML FlexPen Generic drug: insulin aspart INJECT 3 UNITS INTO THE SKIN 3 (THREE) TIMES DAILY WITH MEALS.   omeprazole 20 MG capsule Commonly known as: PRILOSEC Take 20 mg by mouth daily.   polyethylene glycol powder 17 GM/SCOOP powder Commonly known as: GLYCOLAX/MIRALAX Take 17 g by mouth in the morning, at noon, and at bedtime. One scoop in beverage of your choice with each meal. You may increase if you continue to be constipated and decrease if your stool becomes too loose.   rosuvastatin 10 MG tablet Commonly known as: CRESTOR Take 10 mg by mouth daily.   sevelamer carbonate 800 MG tablet Commonly known as: RENVELA Take 800 mg by mouth with breakfast, with lunch, and with evening meal.   tamsulosin 0.4 MG Caps capsule Commonly known as: FLOMAX Take 0.4 mg by mouth at bedtime.   Velphoro 500 MG chewable tablet Generic drug: sucroferric oxyhydroxide Chew  500 mg by mouth 3 (three) times daily.   Vitamin B-12 5000 MCG Tbdp Take 5,000 mcg by mouth in the morning.   vitamin E 1000 UNIT capsule Take 1,000 Units by mouth daily.        Follow-up Information     Irven Coe, MD. Schedule an appointment as soon as possible for a visit in 1 week(s).   Specialty: Family Medicine Contact information: [address] [place] [address] [place] [address] [phone]         [facility] G[facility]enterology Follow up.   Specialty: Gastroenterology Why: Office  will call with date/time, If you dont hear from them,please give them a call Contact information: [address] [place] [address] [phone]        Elder Negus, MD. Schedule an appointment as soon as possible for a visit in 1 week(s).   Specialties: Cardiology, Radiology Contact information: [address] [place] [address] [phone]                No Known Allergies   Other Procedures/Studies: ECHOCARDIOGRAM COMPLETE  Result Date: 03/22/2023    ECHOCARDIOGRAM REPORT   Patient Name:   William Webb Date of Exam: 03/22/2023 Medical Rec #:  [id number]              Height:       75.0 in Accession #:    [id number]             Weight:       190.0 lb Date of Birth:  1951-07-27              BSA:          2.147 m Patient Age:    72 years               BP:           173/87 mmHg Patient Gender: M                      HR:           69 bpm. Exam Location:  Inpatient Procedure: 2D Echo, Cardiac Doppler, Color Doppler, 3D Echo and Strain Analysis Indications:    Syncope R55  History:        Patient has prior history of Echocardiogram examinations, most                 recent 03/13/2023. CAD, Signs/Symptoms:Syncope; Risk                 Factors:Hypertension, Dyslipidemia, Diabetes and Former Smoker.  Sonographer:    Dondra Prader RVT RCS Referring Phys: [id number] ANASTASSIA DOUTOVA IMPRESSIONS  1. Abnormal strain, relatively  preserved at apex. Left ventricular ejection fraction, by estimation, is 50%. The left ventricle has mildly decreased function. The left ventricle demonstrates global hypokinesis. There is moderate concentric left ventricular hypertrophy. Left ventricular diastolic parameters are indeterminate.  2. Right ventricular systolic function is mildly reduced. The right ventricular size is mildly enlarged. There is severely elevated pulmonary artery systolic pressure. The estimated right ventricular systolic pressure is 63.7 mmHg.  3. Left atrial size was moderately dilated.  4. Right atrial size was moderately dilated.  5. The mitral valve is normal in structure. Mild to moderate mitral valve regurgitation. No evidence of mitral stenosis.  6. The aortic valve is tricuspid. There is moderate calcification of the aortic valve. Aortic valve regurgitation is not visualized. Mild aortic valve stenosis. Aortic valve mean gradient measures 13.0 mmHg.  7. The inferior vena cava is dilated in size with <50% respiratory variability, suggesting right atrial pressure of 15 mmHg.  8. A small pericardial effusion is present. The pericardial effusion is circumferential.  9. Would consider the possibility of cardiac amyloidosis. FINDINGS  Left Ventricle: Abnormal strain, relatively preserved at apex. Left ventricular ejection fraction, by estimation, is 50%. The left ventricle has mildly decreased function. The left ventricle demonstrates global hypokinesis. The left ventricular internal  cavity size was normal in size. There is moderate concentric left ventricular hypertrophy.  Left ventricular diastolic parameters are indeterminate. Right Ventricle: The right ventricular size is mildly enlarged. No increase in right ventricular wall thickness. Right ventricular systolic function is mildly reduced. There is severely elevated pulmonary artery systolic pressure. The tricuspid regurgitant velocity is 3.49 m/s, and with an assumed right  atrial pressure of 15 mmHg, the estimated right ventricular systolic pressure is 63.7 mmHg. Left Atrium: Left atrial size was moderately dilated. Right Atrium: Right atrial size was moderately dilated. Pericardium: A small pericardial effusion is present. The pericardial effusion is circumferential. Mitral Valve: The mitral valve is normal in structure. Mild to moderate mitral valve regurgitation. No evidence of mitral valve stenosis. Tricuspid Valve: The tricuspid valve is normal in structure. Tricuspid valve regurgitation is mild. Aortic Valve: The aortic valve is tricuspid. There is moderate calcification of the aortic valve. Aortic valve regurgitation is not visualized. Mild aortic stenosis is present. Aortic valve mean gradient measures 13.0 mmHg. Aortic valve peak gradient measures 22.0 mmHg. Aortic valve area, by VTI measures 2.64 cm. Pulmonic Valve: The pulmonic valve was normal in structure. Pulmonic valve regurgitation is mild. Aorta: The aortic root is normal in size and structure. Venous: The inferior vena cava is dilated in size with less than 50% respiratory variability, suggesting right atrial pressure of 15 mmHg. IAS/Shunts: No atrial level shunt detected by color flow Doppler.  LEFT VENTRICLE PLAX 2D LVIDd:         5.10 cm   Diastology LVIDs:         3.50 cm   LV e' medial:    7.87 cm/s LV PW:         1.30 cm   LV E/e' medial:  12.9 LV IVS:        1.40 cm   LV e' lateral:   9.74 cm/s LVOT diam:     2.20 cm   LV E/e' lateral: 10.4 LV SV:         129 LV SV Index:   60 LVOT Area:     3.80 cm                           3D Volume EF:                          3D EF:        44 %                          LV EDV:       239 ml                          LV ESV:       134 ml                          LV SV:        106 ml RIGHT VENTRICLE            IVC RV Basal diam:  4.80 cm    IVC diam: 2.70 cm RV Mid diam:    3.80 cm RV S prime:     9.56 cm/s TAPSE (M-mode): 2.6 cm LEFT ATRIUM              Index        RIGHT  ATRIUM  Index LA diam:        4.80 cm  2.24 cm/m   RA Area:     28.50 cm LA Vol (A2C):   131.0 ml 61.01 ml/m  RA Volume:   107.00 ml 49.83 ml/m LA Vol (A4C):   90.3 ml  42.05 ml/m LA Biplane Vol: 110.0 ml 51.23 ml/m  AORTIC VALVE                     PULMONIC VALVE AV Area (Vmax):    2.55 cm      PV Vmax:       1.48 m/s AV Area (Vmean):   2.50 cm      PV Peak grad:  8.8 mmHg AV Area (VTI):     2.64 cm AV Vmax:           234.50 cm/s AV Vmean:          158.000 cm/s AV VTI:            0.488 m AV Peak Grad:      22.0 mmHg AV Mean Grad:      13.0 mmHg LVOT Vmax:         157.00 cm/s LVOT Vmean:        104.000 cm/s LVOT VTI:          0.339 m LVOT/AV VTI ratio: 0.70  AORTA Ao Root diam: 2.70 cm Ao Asc diam:  3.40 cm MITRAL VALVE                TRICUSPID VALVE MV Area (PHT): 3.59 cm     TR Peak grad:   48.7 mmHg MV Decel Time: 212 msec     TR Vmax:        349.00 cm/s MV E velocity: 101.75 cm/s MV A velocity: 64.55 cm/s   SHUNTS MV E/A ratio:  1.58         Systemic VTI:  0.34 m                             Systemic Diam: 2.20 cm Dalton McleanMD Electronically signed by Wilfred Lacy Signature Date/Time: 03/22/2023/1:44:53 PM    Final    DG CHEST PORT 1 VIEW  Result Date: 03/21/2023 CLINICAL DATA:  Near syncope EXAM: PORTABLE CHEST 1 VIEW COMPARISON:  03/14/2023 FINDINGS: Cardiomegaly with aortic atherosclerosis. Mild left pleural thickening as before. No consolidation or pneumothorax. Aortic atherosclerosis. Multiple metallic densities to the left of the spine. IMPRESSION: No active disease. Cardiomegaly. Electronically Signed   By: Jasmine Pang M.D.   On: 03/21/2023 19:25   DG Chest 2 View  Result Date: 03/14/2023 CLINICAL DATA:  Syncope EXAM: CHEST - 2 VIEW COMPARISON:  Chest x-ray 03/12/2022 FINDINGS: Radiopaque densities are seen in the posterior chest wall, unchanged. The heart is enlarged. Both lungs are clear. There is some stable pleural thickening adjacent to healed left seventh rib  fracture. No acute fractures are seen. IMPRESSION: 1. No active cardiopulmonary disease. 2. Stable pleural thickening adjacent to chronic left seventh rib fracture. Electronically Signed   By: Darliss Cheney M.D.   On: 03/14/2023 19:18   PCV ECHOCARDIOGRAM COMPLETE  Result Date: 03/13/2023 Echocardiogram 03/12/2023: Normal LV systolic function with visual EF 55-60%. Left ventricle cavity is normal in size. Severe concentric hypertrophy of the left ventricle. Normal global wall motion. Doppler evidence of grade II (pseudonormal) diastolic dysfunction, elevated LAP. Calculated EF 52%. Left atrial cavity is  severely dilated at 53.8 ml/m^2. Right atrial cavity is moderately dilated. Trileaflet aortic valve with no regurgitation. Mild aortic valve leaflet calcification. Mild-moderate aortic stenosis. AV Pk Grad of 32.5 mmHg. mean gradient 14.7, AVA 1.07. Pk vel 2.85. Previously Vmax 2.1 m/sec, mean PG 10 mmHg. Structurally normal mitral valve. Mild to moderate mitral regurgitation. Structurally normal tricuspid valve. Moderate tricuspid regurgitation. Mild pulmonary hypertension. RVSP measures 46 mmHg. Pericardium is normal. Trace pericardial effusion. There is no hemodynamic significance. Compared to 10/2020, AS has slightly progressed and there is now grade II DD.   CT ABDOMEN PELVIS W CONTRAST  Result Date: 02/27/2023 CLINICAL DATA:  Abdomen pain EXAM: CT ABDOMEN AND PELVIS WITH CONTRAST TECHNIQUE: Multidetector CT imaging of the abdomen and pelvis was performed using the standard protocol following bolus administration of intravenous contrast. RADIATION DOSE REDUCTION: This exam was performed according to the departmental dose-optimization program which includes automated exposure control, adjustment of the mA and/or kV according to patient size and/or use of iterative reconstruction technique. CONTRAST:  75mL OMNIPAQUE IOHEXOL 350 MG/ML SOLN COMPARISON:  CT 09/28/2021 FINDINGS: Lower chest: Lung bases  demonstrate no acute airspace disease. Coronary vascular calcification Hepatobiliary: Small gallstones. No focal hepatic abnormality. No biliary dilatation Pancreas: Unremarkable. No pancreatic ductal dilatation or surrounding inflammatory changes. Spleen: Normal in size without focal abnormality. Adrenals/Urinary Tract: Adrenal glands are stable. 1.8 cm right adrenal nodule, previously 1.8 cm and consistent with adenoma. No imaging follow-up is recommended. Cortical scarring right kidney. No hydronephrosis. Poor excretion of contrast on delayed views consistent with decreased renal function. The bladder is slightly thick walled Stomach/Bowel: The stomach is nonenlarged. There is no dilated small bowel. No acute bowel wall thickening. Moderate stool burden with moderate retained feces at the rectum. Negative appendix. Vascular/Lymphatic: Advanced aortic atherosclerosis. No aneurysm. No suspicious lymph nodes Reproductive: Prostate is unremarkable. Other: Negative for pelvic effusion or free air. Musculoskeletal: No acute or suspicious osseous abnormality. IMPRESSION: 1. No CT evidence for acute intra-abdominal or pelvic abnormality. Moderate stool in the colon. 2. Gallstones. 3. Poor excretion of contrast on delayed views consistent with decreased renal function. Correlate with appropriate laboratory values. 4. Aortic atherosclerosis. Aortic Atherosclerosis (ICD10-I70.0). Electronically Signed   By: Jasmine Pang M.D.   On: 02/27/2023 02:39     TODAY-DAY OF DISCHARGE:  Subjective:   William Webb today has no headache,no chest abdominal pain,no new weakness tingling or numbness, feels much better wants to go home today.  Objective:   Blood pressure (!) 180/79, pulse 60, temperature 98.5 F (36.9 C), temperature source Oral, resp. rate 12, weight 97 kg, SpO2 99 %.  Intake/Output Summary (Last 24 hours) at 03/23/2023 0848 Last data filed at 03/22/2023 2030 Gross per 24 hour  Intake --  Output 2000 ml   Net -2000 ml   Filed Weights   03/22/23 2049  Weight: 97 kg    Exam: Awake Alert, Oriented *3, No new F.N deficits, Normal affect [place].AT,PERRAL Supple Neck,No JVD, No cervical lymphadenopathy appriciated.  Symmetrical Chest wall movement, Good air movement bilaterally, CTAB RRR,No Gallops,Rubs or new Murmurs, No Parasternal Heave +ve B.Sounds, Abd Soft, Non tender, No organomegaly appriciated, No rebound -guarding or rigidity. No Cyanosis, Clubbing or edema, No new Rash or bruise   PERTINENT RADIOLOGIC STUDIES: ECHOCARDIOGRAM COMPLETE  Result Date: 03/22/2023    ECHOCARDIOGRAM REPORT   Patient Name:   William Webb Date of Exam: 03/22/2023 Medical Rec #:  [id number]  Height:       75.0 in Accession #:    [id number]             Weight:       190.0 lb Date of Birth:  [birth year]              BSA:          2.147 m Patient Age:    72 years               BP:           173/87 mmHg Patient Gender: M                      HR:           69 bpm. Exam Location:  Inpatient Procedure: 2D Echo, Cardiac Doppler, Color Doppler, 3D Echo and Strain Analysis Indications:    Syncope R55  History:        Patient has prior history of Echocardiogram examinations, most                 recent 03/13/2023. CAD, Signs/Symptoms:Syncope; Risk                 Factors:Hypertension, Dyslipidemia, Diabetes and Former Smoker.  Sonographer:    Dondra Prader RVT RCS Referring Phys: [id number] ANASTASSIA DOUTOVA IMPRESSIONS  1. Abnormal strain, relatively preserved at apex. Left ventricular ejection fraction, by estimation, is 50%. The left ventricle has mildly decreased function. The left ventricle demonstrates global hypokinesis. There is moderate concentric left ventricular hypertrophy. Left ventricular diastolic parameters are indeterminate.  2. Right ventricular systolic function is mildly reduced. The right ventricular size is mildly enlarged. There is severely elevated pulmonary artery systolic pressure. The estimated  right ventricular systolic pressure is 63.7 mmHg.  3. Left atrial size was moderately dilated.  4. Right atrial size was moderately dilated.  5. The mitral valve is normal in structure. Mild to moderate mitral valve regurgitation. No evidence of mitral stenosis.  6. The aortic valve is tricuspid. There is moderate calcification of the aortic valve. Aortic valve regurgitation is not visualized. Mild aortic valve stenosis. Aortic valve mean gradient measures 13.0 mmHg.  7. The inferior vena cava is dilated in size with <50% respiratory variability, suggesting right atrial pressure of 15 mmHg.  8. A small pericardial effusion is present. The pericardial effusion is circumferential.  9. Would consider the possibility of cardiac amyloidosis. FINDINGS  Left Ventricle: Abnormal strain, relatively preserved at apex. Left ventricular ejection fraction, by estimation, is 50%. The left ventricle has mildly decreased function. The left ventricle demonstrates global hypokinesis. The left ventricular internal  cavity size was normal in size. There is moderate concentric left ventricular hypertrophy. Left ventricular diastolic parameters are indeterminate. Right Ventricle: The right ventricular size is mildly enlarged. No increase in right ventricular wall thickness. Right ventricular systolic function is mildly reduced. There is severely elevated pulmonary artery systolic pressure. The tricuspid regurgitant velocity is 3.49 m/s, and with an assumed right atrial pressure of 15 mmHg, the estimated right ventricular systolic pressure is 63.7 mmHg. Left Atrium: Left atrial size was moderately dilated. Right Atrium: Right atrial size was moderately dilated. Pericardium: A small pericardial effusion is present. The pericardial effusion is circumferential. Mitral Valve: The mitral valve is normal in structure. Mild to moderate mitral valve regurgitation. No evidence of mitral valve stenosis. Tricuspid Valve: The tricuspid valve is  normal in structure. Tricuspid valve regurgitation is mild. Aortic Valve: The  aortic valve is tricuspid. There is moderate calcification of the aortic valve. Aortic valve regurgitation is not visualized. Mild aortic stenosis is present. Aortic valve mean gradient measures 13.0 mmHg. Aortic valve peak gradient measures 22.0 mmHg. Aortic valve area, by VTI measures 2.64 cm. Pulmonic Valve: The pulmonic valve was normal in structure. Pulmonic valve regurgitation is mild. Aorta: The aortic root is normal in size and structure. Venous: The inferior vena cava is dilated in size with less than 50% respiratory variability, suggesting right atrial pressure of 15 mmHg. IAS/Shunts: No atrial level shunt detected by color flow Doppler.  LEFT VENTRICLE PLAX 2D LVIDd:         5.10 cm   Diastology LVIDs:         3.50 cm   LV e' medial:    7.87 cm/s LV PW:         1.30 cm   LV E/e' medial:  12.9 LV IVS:        1.40 cm   LV e' lateral:   9.74 cm/s LVOT diam:     2.20 cm   LV E/e' lateral: 10.4 LV SV:         129 LV SV Index:   60 LVOT Area:     3.80 cm                           3D Volume EF:                          3D EF:        44 %                          LV EDV:       239 ml                          LV ESV:       134 ml                          LV SV:        106 ml RIGHT VENTRICLE            IVC RV Basal diam:  4.80 cm    IVC diam: 2.70 cm RV Mid diam:    3.80 cm RV S prime:     9.56 cm/s TAPSE (M-mode): 2.6 cm LEFT ATRIUM              Index        RIGHT ATRIUM           Index LA diam:        4.80 cm  2.24 cm/m   RA Area:     28.50 cm LA Vol (A2C):   131.0 ml 61.01 ml/m  RA Volume:   107.00 ml 49.83 ml/m LA Vol (A4C):   90.3 ml  42.05 ml/m LA Biplane Vol: 110.0 ml 51.23 ml/m  AORTIC VALVE                     PULMONIC VALVE AV Area (Vmax):    2.55 cm      PV Vmax:       1.48 m/s AV Area (Vmean):   2.50 cm      PV Peak grad:  8.8 mmHg AV  Area (VTI):     2.64 cm AV Vmax:           234.50 cm/s AV Vmean:          158.000  cm/s AV VTI:            0.488 m AV Peak Grad:      22.0 mmHg AV Mean Grad:      13.0 mmHg LVOT Vmax:         157.00 cm/s LVOT Vmean:        104.000 cm/s LVOT VTI:          0.339 m LVOT/AV VTI ratio: 0.70  AORTA Ao Root diam: 2.70 cm Ao Asc diam:  3.40 cm MITRAL VALVE                TRICUSPID VALVE MV Area (PHT): 3.59 cm     TR Peak grad:   48.7 mmHg MV Decel Time: 212 msec     TR Vmax:        349.00 cm/s MV E velocity: 101.75 cm/s MV A velocity: 64.55 cm/s   SHUNTS MV E/A ratio:  1.58         Systemic VTI:  0.34 m                             Systemic Diam: 2.20 cm Dalton McleanMD Electronically signed by Wilfred Lacy Signature Date/Time: 03/22/2023/1:44:53 PM    Final    DG CHEST PORT 1 VIEW  Result Date: 03/21/2023 CLINICAL DATA:  Near syncope EXAM: PORTABLE CHEST 1 VIEW COMPARISON:  03/14/2023 FINDINGS: Cardiomegaly with aortic atherosclerosis. Mild left pleural thickening as before. No consolidation or pneumothorax. Aortic atherosclerosis. Multiple metallic densities to the left of the spine. IMPRESSION: No active disease. Cardiomegaly. Electronically Signed   By: Jasmine Pang M.D.   On: 03/21/2023 19:25     PERTINENT LAB RESULTS: CBC: Recent Labs    03/22/23 2320 03/23/23 0244  WBC 4.2 4.1  HGB 8.5* 8.5*  HCT 26.6* 26.1*  PLT 121* 113*   CMET CMP     Component Value Date/Time   NA 134 (L) 03/23/2023 0244   NA 140 09/22/2019 1507   K 4.3 03/23/2023 0244   CL 95 (L) 03/23/2023 0244   CO2 30 03/23/2023 0244   GLUCOSE 119 (H) 03/23/2023 0244   BUN 19 03/23/2023 0244   BUN 27 09/22/2019 1507   CREATININE 4.24 (H) 03/23/2023 0244   CREATININE 2.90 (H) 01/06/2020 1430   CALCIUM 7.8 (L) 03/23/2023 0244   PROT 6.3 (L) 03/22/2023 0418   ALBUMIN 2.8 (L) 03/23/2023 0244   AST 44 (H) 03/22/2023 0418   AST 13 (L) 01/06/2020 1430   ALT 65 (H) 03/22/2023 0418   ALT 12 01/06/2020 1430   ALKPHOS 95 03/22/2023 0418   BILITOT 0.9 03/22/2023 0418   BILITOT 0.4 01/06/2020 1430   GFRNONAA  14 (L) 03/23/2023 0244   GFRNONAA 21 (L) 01/06/2020 1430   GFRAA 16 (L) 08/02/2020 0437   GFRAA 24 (L) 01/06/2020 1430    GFR Estimated Creatinine Clearance: 18.5 mL/min (A) (by C-G formula based on SCr of 4.24 mg/dL (H)). No results for input(s): "LIPASE", "AMYLASE" in the last 72 hours. Recent Labs    03/22/23 0418  CKTOTAL 127   Invalid input(s): "POCBNP" No results for input(s): "DDIMER" in the last 72 hours. Recent Labs    03/22/23 0418  HGBA1C 6.0*   No  results for input(s): "CHOL", "HDL", "LDLCALC", "TRIG", "CHOLHDL", "LDLDIRECT" in the last 72 hours. Recent Labs    03/22/23 0418  TSH 2.044   Recent Labs    03/22/23 0418  VITAMINB12 853  FOLATE 13.3  FERRITIN 931*  TIBC 174*  IRON 44*  RETICCTPCT 2.4   Coags: Recent Labs    03/21/23 1754  INR 1.4*   Microbiology: No results found for this or any previous visit (from the past 240 hour(s)).  FURTHER DISCHARGE INSTRUCTIONS:  Get Medicines reviewed and adjusted: Please take all your medications with you for your next visit with your Primary MD  Laboratory/radiological data: Please request your Primary MD to go over all hospital tests and procedure/radiological results at the follow up, please ask your Primary MD to get all Hospital records sent to his/her office.  In some cases, they will be blood work, cultures and biopsy results pending at the time of your discharge. Please request that your primary care M.D. goes through all the records of your hospital data and follows up on these results.  Also Note the following: If you experience worsening of your admission symptoms, develop shortness of breath, life threatening emergency, suicidal or homicidal thoughts you must seek medical attention immediately by calling 911 or calling your MD immediately  if symptoms less severe.  You must read complete instructions/literature along with all the possible adverse reactions/side effects for all the Medicines you  take and that have been prescribed to you. Take any new Medicines after you have completely understood and accpet all the possible adverse reactions/side effects.   Do not drive when taking Pain medications or sleeping medications (Benzodaizepines)  Do not take more than prescribed Pain, Sleep and Anxiety Medications. It is not advisable to combine anxiety,sleep and pain medications without talking with your primary care practitioner  Special Instructions: If you have smoked or chewed Tobacco  in the last 2 yrs please stop smoking, stop any regular Alcohol  and or any Recreational drug use.  Wear Seat belts while driving.  Please note: You were cared for by a hospitalist during your hospital stay. Once you are discharged, your primary care physician will handle any further medical issues. Please note that NO REFILLS for any discharge medications will be authorized once you are discharged, as it is imperative that you return to your primary care physician (or establish a relationship with a primary care physician if you do not have one) for your post hospital discharge needs so that they can reassess your need for medications and monitor your lab values.  Total Time spent coordinating discharge including counseling, education and face to face time equals greater than 30 minutes.  SignedJeoffrey Massed 03/23/2023 8:48 AM

## 2023-03-23 NOTE — Progress Notes (Signed)
[facility] Progress Note   Subjective:  Seen in room. Completed dialysis last night with 2L UF. No issues with dialysis, but not happy about late treatment time. Feels well, ready to go home. No further bleeding. No cp/dyspnea.  For discharge today.   Objective Vitals:   03/22/23 2119 03/23/23 0000 03/23/23 0400 03/23/23 0849  BP: (!) 158/71 (!) 151/69 (!) 180/79   Pulse: 68 61 60   Resp: 15 13 12    Temp: 98.6 F (37 C)  98.5 F (36.9 C) 98.4 F (36.9 C)  TempSrc: Oral  Oral Oral  SpO2: 94% 100% 99%   Weight:         Additional Objective Labs: Basic Metabolic Panel: Recent Labs  Lab 03/21/23 1532 03/22/23 0418 03/23/23 0244  NA 139 139 134*  K 3.5 3.2* 4.3  CL 97* 95* 95*  CO2 30 29 30   GLUCOSE 125* 83 119*  BUN 23 27* 19  CREATININE 5.17* 5.77* 4.24*  CALCIUM 7.6* 7.6* 7.8*  PHOS  --  4.1 3.0   CBC: Recent Labs  Lab 03/21/23 1532 03/22/23 0418 03/22/23 1112 03/22/23 2320 03/23/23 0244  WBC 3.9* 3.5* 4.9 4.2 4.1  HGB 7.4* 8.4* 9.3* 8.5* 8.5*  HCT 23.1* 25.4* 28.4* 26.6* 26.1*  MCV 99.6 94.8 95.0 96.4 95.6  PLT 132* 117* 139* 121* 113*   Blood Culture    Component Value Date/Time   SDES WOUND 03/16/2022 1714   SPECREQUEST RIGHT FOOT WOUND 03/16/2022 1714   CULT  03/16/2022 1714    RARE MULTIPLE ORGANISMS PRESENT, NONE PREDOMINANT NO GROUP A STREP (S.PYOGENES) ISOLATED NO STAPHYLOCOCCUS AUREUS ISOLATED NO ANAEROBES ISOLATED Performed at [facility], [address] [place], [place] [address]    REPTSTATUS 03/21/2022 FINAL 03/16/2022 1714     Physical Exam General: NAD, lying in bed Heart: RRR Lungs: Clear bilaterally Abdomen: soft non-tender Extremities: No LE edema Dialysis Access: LUE AVF +bruit   Medications:  sodium chloride      atorvastatin  80 mg Oral Daily   carvedilol  25 mg Oral BID WC   Chlorhexidine Gluconate Cloth  6 each Topical Q0600   finasteride  5 mg Oral Daily   hydrocortisone  25 mg Rectal  BID   insulin aspart  0-6 Units Subcutaneous Q4H   isosorbide mononitrate  60 mg Oral Daily   losartan  100 mg Oral Daily   pantoprazole  40 mg Oral Daily   polyethylene glycol  17 g Oral BID   sevelamer carbonate  800 mg Oral TID with meals   sodium chloride flush  3 mL Intravenous Q12H   tamsulosin  0.4 mg Oral QHS    [facility] - MWF 4hrs, BFR 425, DFR 800,  EDW 83.5kg, 2K/ 2Ca Heparin bolus 5000 units Mircera 100 mcg q2wks - last 5/13 Hectorol IV qHD - last 5/22 Velphoro 1 tab with meals and 1 tab with snacks   Assessment/Plan: LGIB/Symptomatic Anemia - s/p 2 units PRBCs 5/23. Likely 2nd hemorrhoids. Recent Colonoscopy unrevealing. Holding Eliquis. On anusol suppository ESRD -  on HD MWF. Continue on schedule. Next HD 5/27. Hypertension/volume  - BP better with HD. Please note of patient having multiple episodes of passing out/unresponsiveness on and off HD in outpatient. Noted max UF of 2.5L in outpatient so we need to be mindful of this.  Anemia of CKD - Hb 8.5. Continue ESA as OP Secondary Hyperparathyroidism -  Corr Ca 8.4, will resume Hectorol. Phos at goal.  Resume binders when he starts eating. Nutrition - Continue renal diet.   Tomasa Blase PA-C  [facility] 03/23/2023,8:59 AM

## 2023-03-23 NOTE — Discharge Instructions (Signed)
Follow up with PCP in 1-2 weeks Please obtain CMP/CBC in one week

## 2023-03-24 ENCOUNTER — Telehealth: Payer: Self-pay | Admitting: Nephrology

## 2023-03-24 NOTE — Telephone Encounter (Signed)
Transition of Care - Initial Contact from Inpatient Facility  Date of discharge: 03/23/23 Date of contact: 03/24/23  Method: Phone Spoke to: Patient  Patient contacted to discuss transition of care from recent inpatient hospitalization. Patient was admitted to [facility] from 5/23-5/25/24 with discharge diagnosis of acute blood loss anemia/hemorrhoidal bleed.   The discharge medication list was reviewed. Patient understands the changes and has no concerns.   Patient will return to his/her outpatient HD unit on: Monday May 27   No other concerns at this time.

## 2023-03-25 DIAGNOSIS — N2581 Secondary hyperparathyroidism of renal origin: Secondary | ICD-10-CM | POA: Diagnosis not present

## 2023-03-25 DIAGNOSIS — D631 Anemia in chronic kidney disease: Secondary | ICD-10-CM | POA: Diagnosis not present

## 2023-03-25 DIAGNOSIS — N186 End stage renal disease: Secondary | ICD-10-CM | POA: Diagnosis not present

## 2023-03-25 DIAGNOSIS — R110 Nausea: Secondary | ICD-10-CM | POA: Diagnosis not present

## 2023-03-25 DIAGNOSIS — E1129 Type 2 diabetes mellitus with other diabetic kidney complication: Secondary | ICD-10-CM | POA: Diagnosis not present

## 2023-03-25 DIAGNOSIS — Z992 Dependence on renal dialysis: Secondary | ICD-10-CM | POA: Diagnosis not present

## 2023-03-25 DIAGNOSIS — D689 Coagulation defect, unspecified: Secondary | ICD-10-CM | POA: Diagnosis not present

## 2023-03-25 DIAGNOSIS — D509 Iron deficiency anemia, unspecified: Secondary | ICD-10-CM | POA: Diagnosis not present

## 2023-03-26 ENCOUNTER — Telehealth: Payer: Self-pay

## 2023-03-26 NOTE — Telephone Encounter (Signed)
Patient scheduled for next avail appointment - August 29th. Letter mailed to patient with appointment info

## 2023-03-26 NOTE — Progress Notes (Signed)
Late Note Entry  Pt was d/c to home on Saturday. Renal PA sent orders to [facility] at d/c for pt's next treatment on Monday.   Olivia Canter Renal Navigator [phone]

## 2023-03-26 NOTE — Telephone Encounter (Signed)
-----   Message from Benancio Deeds, MD sent at 03/22/2023 12:53 PM EDT ----- Regarding: outpatient follow up Can you help coordinate outpatient follow up with me? For anemia, hemorrhoids. Thanks

## 2023-03-27 DIAGNOSIS — D631 Anemia in chronic kidney disease: Secondary | ICD-10-CM | POA: Diagnosis not present

## 2023-03-27 DIAGNOSIS — E1129 Type 2 diabetes mellitus with other diabetic kidney complication: Secondary | ICD-10-CM | POA: Diagnosis not present

## 2023-03-27 DIAGNOSIS — Z992 Dependence on renal dialysis: Secondary | ICD-10-CM | POA: Diagnosis not present

## 2023-03-27 DIAGNOSIS — N2581 Secondary hyperparathyroidism of renal origin: Secondary | ICD-10-CM | POA: Diagnosis not present

## 2023-03-27 DIAGNOSIS — D689 Coagulation defect, unspecified: Secondary | ICD-10-CM | POA: Diagnosis not present

## 2023-03-27 DIAGNOSIS — R110 Nausea: Secondary | ICD-10-CM | POA: Diagnosis not present

## 2023-03-27 DIAGNOSIS — N186 End stage renal disease: Secondary | ICD-10-CM | POA: Diagnosis not present

## 2023-03-27 DIAGNOSIS — D509 Iron deficiency anemia, unspecified: Secondary | ICD-10-CM | POA: Diagnosis not present

## 2023-03-28 DIAGNOSIS — N2581 Secondary hyperparathyroidism of renal origin: Secondary | ICD-10-CM | POA: Diagnosis not present

## 2023-03-28 DIAGNOSIS — N186 End stage renal disease: Secondary | ICD-10-CM | POA: Diagnosis not present

## 2023-03-28 DIAGNOSIS — E8770 Fluid overload, unspecified: Secondary | ICD-10-CM | POA: Diagnosis not present

## 2023-03-28 DIAGNOSIS — Z992 Dependence on renal dialysis: Secondary | ICD-10-CM | POA: Diagnosis not present

## 2023-03-29 DIAGNOSIS — Z992 Dependence on renal dialysis: Secondary | ICD-10-CM | POA: Diagnosis not present

## 2023-03-29 DIAGNOSIS — D509 Iron deficiency anemia, unspecified: Secondary | ICD-10-CM | POA: Diagnosis not present

## 2023-03-29 DIAGNOSIS — N2581 Secondary hyperparathyroidism of renal origin: Secondary | ICD-10-CM | POA: Diagnosis not present

## 2023-03-29 DIAGNOSIS — E1129 Type 2 diabetes mellitus with other diabetic kidney complication: Secondary | ICD-10-CM | POA: Diagnosis not present

## 2023-03-29 DIAGNOSIS — R110 Nausea: Secondary | ICD-10-CM | POA: Diagnosis not present

## 2023-03-29 DIAGNOSIS — D689 Coagulation defect, unspecified: Secondary | ICD-10-CM | POA: Diagnosis not present

## 2023-03-29 DIAGNOSIS — N186 End stage renal disease: Secondary | ICD-10-CM | POA: Diagnosis not present

## 2023-03-29 DIAGNOSIS — D631 Anemia in chronic kidney disease: Secondary | ICD-10-CM | POA: Diagnosis not present

## 2023-04-01 ENCOUNTER — Ambulatory Visit: Payer: Medicare Other | Admitting: Podiatry

## 2023-04-01 ENCOUNTER — Encounter: Payer: Self-pay | Admitting: Podiatry

## 2023-04-01 DIAGNOSIS — E11621 Type 2 diabetes mellitus with foot ulcer: Secondary | ICD-10-CM

## 2023-04-01 DIAGNOSIS — M79675 Pain in left toe(s): Secondary | ICD-10-CM

## 2023-04-01 DIAGNOSIS — D631 Anemia in chronic kidney disease: Secondary | ICD-10-CM | POA: Diagnosis not present

## 2023-04-01 DIAGNOSIS — L97512 Non-pressure chronic ulcer of other part of right foot with fat layer exposed: Secondary | ICD-10-CM

## 2023-04-01 DIAGNOSIS — E1129 Type 2 diabetes mellitus with other diabetic kidney complication: Secondary | ICD-10-CM | POA: Diagnosis not present

## 2023-04-01 DIAGNOSIS — B351 Tinea unguium: Secondary | ICD-10-CM | POA: Diagnosis not present

## 2023-04-01 DIAGNOSIS — N2581 Secondary hyperparathyroidism of renal origin: Secondary | ICD-10-CM | POA: Diagnosis not present

## 2023-04-01 DIAGNOSIS — D689 Coagulation defect, unspecified: Secondary | ICD-10-CM | POA: Diagnosis not present

## 2023-04-01 DIAGNOSIS — M79674 Pain in right toe(s): Secondary | ICD-10-CM | POA: Diagnosis not present

## 2023-04-01 DIAGNOSIS — R110 Nausea: Secondary | ICD-10-CM | POA: Diagnosis not present

## 2023-04-01 DIAGNOSIS — D509 Iron deficiency anemia, unspecified: Secondary | ICD-10-CM | POA: Diagnosis not present

## 2023-04-01 DIAGNOSIS — Z992 Dependence on renal dialysis: Secondary | ICD-10-CM | POA: Diagnosis not present

## 2023-04-01 DIAGNOSIS — N186 End stage renal disease: Secondary | ICD-10-CM | POA: Diagnosis not present

## 2023-04-03 DIAGNOSIS — D631 Anemia in chronic kidney disease: Secondary | ICD-10-CM | POA: Diagnosis not present

## 2023-04-03 DIAGNOSIS — N186 End stage renal disease: Secondary | ICD-10-CM | POA: Diagnosis not present

## 2023-04-03 DIAGNOSIS — R110 Nausea: Secondary | ICD-10-CM | POA: Diagnosis not present

## 2023-04-03 DIAGNOSIS — E1159 Type 2 diabetes mellitus with other circulatory complications: Secondary | ICD-10-CM | POA: Diagnosis not present

## 2023-04-03 DIAGNOSIS — Z992 Dependence on renal dialysis: Secondary | ICD-10-CM | POA: Diagnosis not present

## 2023-04-03 DIAGNOSIS — D689 Coagulation defect, unspecified: Secondary | ICD-10-CM | POA: Diagnosis not present

## 2023-04-03 DIAGNOSIS — E1129 Type 2 diabetes mellitus with other diabetic kidney complication: Secondary | ICD-10-CM | POA: Diagnosis not present

## 2023-04-03 DIAGNOSIS — N2581 Secondary hyperparathyroidism of renal origin: Secondary | ICD-10-CM | POA: Diagnosis not present

## 2023-04-03 DIAGNOSIS — D509 Iron deficiency anemia, unspecified: Secondary | ICD-10-CM | POA: Diagnosis not present

## 2023-04-03 NOTE — Progress Notes (Signed)
Subjective:   Patient ID: William Webb, male   DOB: 72 y.o.   MRN: [id number]   HPI Patient presents stating his nails have been incurvated on both feet they get thick and he cannot take care of them with long-term diabetes is high risk factor.  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  Debrided nailbeds 1-5 both feet no angiogenic bleeding reappoint routine care

## 2023-04-05 DIAGNOSIS — Z992 Dependence on renal dialysis: Secondary | ICD-10-CM | POA: Diagnosis not present

## 2023-04-05 DIAGNOSIS — R110 Nausea: Secondary | ICD-10-CM | POA: Diagnosis not present

## 2023-04-05 DIAGNOSIS — D509 Iron deficiency anemia, unspecified: Secondary | ICD-10-CM | POA: Diagnosis not present

## 2023-04-05 DIAGNOSIS — D631 Anemia in chronic kidney disease: Secondary | ICD-10-CM | POA: Diagnosis not present

## 2023-04-05 DIAGNOSIS — D689 Coagulation defect, unspecified: Secondary | ICD-10-CM | POA: Diagnosis not present

## 2023-04-05 DIAGNOSIS — E1129 Type 2 diabetes mellitus with other diabetic kidney complication: Secondary | ICD-10-CM | POA: Diagnosis not present

## 2023-04-05 DIAGNOSIS — N2581 Secondary hyperparathyroidism of renal origin: Secondary | ICD-10-CM | POA: Diagnosis not present

## 2023-04-05 DIAGNOSIS — N186 End stage renal disease: Secondary | ICD-10-CM | POA: Diagnosis not present

## 2023-04-08 DIAGNOSIS — N2581 Secondary hyperparathyroidism of renal origin: Secondary | ICD-10-CM | POA: Diagnosis not present

## 2023-04-08 DIAGNOSIS — R110 Nausea: Secondary | ICD-10-CM | POA: Diagnosis not present

## 2023-04-08 DIAGNOSIS — D631 Anemia in chronic kidney disease: Secondary | ICD-10-CM | POA: Diagnosis not present

## 2023-04-08 DIAGNOSIS — D689 Coagulation defect, unspecified: Secondary | ICD-10-CM | POA: Diagnosis not present

## 2023-04-08 DIAGNOSIS — N186 End stage renal disease: Secondary | ICD-10-CM | POA: Diagnosis not present

## 2023-04-08 DIAGNOSIS — D509 Iron deficiency anemia, unspecified: Secondary | ICD-10-CM | POA: Diagnosis not present

## 2023-04-08 DIAGNOSIS — Z992 Dependence on renal dialysis: Secondary | ICD-10-CM | POA: Diagnosis not present

## 2023-04-08 DIAGNOSIS — E1129 Type 2 diabetes mellitus with other diabetic kidney complication: Secondary | ICD-10-CM | POA: Diagnosis not present

## 2023-04-10 DIAGNOSIS — D689 Coagulation defect, unspecified: Secondary | ICD-10-CM | POA: Diagnosis not present

## 2023-04-10 DIAGNOSIS — R110 Nausea: Secondary | ICD-10-CM | POA: Diagnosis not present

## 2023-04-10 DIAGNOSIS — N186 End stage renal disease: Secondary | ICD-10-CM | POA: Diagnosis not present

## 2023-04-10 DIAGNOSIS — N2581 Secondary hyperparathyroidism of renal origin: Secondary | ICD-10-CM | POA: Diagnosis not present

## 2023-04-10 DIAGNOSIS — Z992 Dependence on renal dialysis: Secondary | ICD-10-CM | POA: Diagnosis not present

## 2023-04-10 DIAGNOSIS — D631 Anemia in chronic kidney disease: Secondary | ICD-10-CM | POA: Diagnosis not present

## 2023-04-10 DIAGNOSIS — E1129 Type 2 diabetes mellitus with other diabetic kidney complication: Secondary | ICD-10-CM | POA: Diagnosis not present

## 2023-04-10 DIAGNOSIS — D509 Iron deficiency anemia, unspecified: Secondary | ICD-10-CM | POA: Diagnosis not present

## 2023-04-12 DIAGNOSIS — N186 End stage renal disease: Secondary | ICD-10-CM | POA: Diagnosis not present

## 2023-04-12 DIAGNOSIS — R110 Nausea: Secondary | ICD-10-CM | POA: Diagnosis not present

## 2023-04-12 DIAGNOSIS — N2581 Secondary hyperparathyroidism of renal origin: Secondary | ICD-10-CM | POA: Diagnosis not present

## 2023-04-12 DIAGNOSIS — D509 Iron deficiency anemia, unspecified: Secondary | ICD-10-CM | POA: Diagnosis not present

## 2023-04-12 DIAGNOSIS — D631 Anemia in chronic kidney disease: Secondary | ICD-10-CM | POA: Diagnosis not present

## 2023-04-12 DIAGNOSIS — Z992 Dependence on renal dialysis: Secondary | ICD-10-CM | POA: Diagnosis not present

## 2023-04-12 DIAGNOSIS — E1129 Type 2 diabetes mellitus with other diabetic kidney complication: Secondary | ICD-10-CM | POA: Diagnosis not present

## 2023-04-12 DIAGNOSIS — D689 Coagulation defect, unspecified: Secondary | ICD-10-CM | POA: Diagnosis not present

## 2023-04-15 DIAGNOSIS — D509 Iron deficiency anemia, unspecified: Secondary | ICD-10-CM | POA: Diagnosis not present

## 2023-04-15 DIAGNOSIS — N2581 Secondary hyperparathyroidism of renal origin: Secondary | ICD-10-CM | POA: Diagnosis not present

## 2023-04-15 DIAGNOSIS — E1129 Type 2 diabetes mellitus with other diabetic kidney complication: Secondary | ICD-10-CM | POA: Diagnosis not present

## 2023-04-15 DIAGNOSIS — R110 Nausea: Secondary | ICD-10-CM | POA: Diagnosis not present

## 2023-04-15 DIAGNOSIS — Z992 Dependence on renal dialysis: Secondary | ICD-10-CM | POA: Diagnosis not present

## 2023-04-15 DIAGNOSIS — D689 Coagulation defect, unspecified: Secondary | ICD-10-CM | POA: Diagnosis not present

## 2023-04-15 DIAGNOSIS — N186 End stage renal disease: Secondary | ICD-10-CM | POA: Diagnosis not present

## 2023-04-15 DIAGNOSIS — D631 Anemia in chronic kidney disease: Secondary | ICD-10-CM | POA: Diagnosis not present

## 2023-04-17 DIAGNOSIS — D631 Anemia in chronic kidney disease: Secondary | ICD-10-CM | POA: Diagnosis not present

## 2023-04-17 DIAGNOSIS — Z992 Dependence on renal dialysis: Secondary | ICD-10-CM | POA: Diagnosis not present

## 2023-04-17 DIAGNOSIS — D509 Iron deficiency anemia, unspecified: Secondary | ICD-10-CM | POA: Diagnosis not present

## 2023-04-17 DIAGNOSIS — D689 Coagulation defect, unspecified: Secondary | ICD-10-CM | POA: Diagnosis not present

## 2023-04-17 DIAGNOSIS — E1129 Type 2 diabetes mellitus with other diabetic kidney complication: Secondary | ICD-10-CM | POA: Diagnosis not present

## 2023-04-17 DIAGNOSIS — R110 Nausea: Secondary | ICD-10-CM | POA: Diagnosis not present

## 2023-04-17 DIAGNOSIS — N186 End stage renal disease: Secondary | ICD-10-CM | POA: Diagnosis not present

## 2023-04-17 DIAGNOSIS — N2581 Secondary hyperparathyroidism of renal origin: Secondary | ICD-10-CM | POA: Diagnosis not present

## 2023-04-19 DIAGNOSIS — D509 Iron deficiency anemia, unspecified: Secondary | ICD-10-CM | POA: Diagnosis not present

## 2023-04-19 DIAGNOSIS — Z992 Dependence on renal dialysis: Secondary | ICD-10-CM | POA: Diagnosis not present

## 2023-04-19 DIAGNOSIS — N2581 Secondary hyperparathyroidism of renal origin: Secondary | ICD-10-CM | POA: Diagnosis not present

## 2023-04-19 DIAGNOSIS — E1129 Type 2 diabetes mellitus with other diabetic kidney complication: Secondary | ICD-10-CM | POA: Diagnosis not present

## 2023-04-19 DIAGNOSIS — D631 Anemia in chronic kidney disease: Secondary | ICD-10-CM | POA: Diagnosis not present

## 2023-04-19 DIAGNOSIS — N186 End stage renal disease: Secondary | ICD-10-CM | POA: Diagnosis not present

## 2023-04-19 DIAGNOSIS — R110 Nausea: Secondary | ICD-10-CM | POA: Diagnosis not present

## 2023-04-19 DIAGNOSIS — D689 Coagulation defect, unspecified: Secondary | ICD-10-CM | POA: Diagnosis not present

## 2023-04-22 DIAGNOSIS — D509 Iron deficiency anemia, unspecified: Secondary | ICD-10-CM | POA: Diagnosis not present

## 2023-04-22 DIAGNOSIS — D689 Coagulation defect, unspecified: Secondary | ICD-10-CM | POA: Diagnosis not present

## 2023-04-22 DIAGNOSIS — R110 Nausea: Secondary | ICD-10-CM | POA: Diagnosis not present

## 2023-04-22 DIAGNOSIS — N2581 Secondary hyperparathyroidism of renal origin: Secondary | ICD-10-CM | POA: Diagnosis not present

## 2023-04-22 DIAGNOSIS — E1129 Type 2 diabetes mellitus with other diabetic kidney complication: Secondary | ICD-10-CM | POA: Diagnosis not present

## 2023-04-22 DIAGNOSIS — Z992 Dependence on renal dialysis: Secondary | ICD-10-CM | POA: Diagnosis not present

## 2023-04-22 DIAGNOSIS — N186 End stage renal disease: Secondary | ICD-10-CM | POA: Diagnosis not present

## 2023-04-22 DIAGNOSIS — D631 Anemia in chronic kidney disease: Secondary | ICD-10-CM | POA: Diagnosis not present

## 2023-04-24 DIAGNOSIS — Z992 Dependence on renal dialysis: Secondary | ICD-10-CM | POA: Diagnosis not present

## 2023-04-24 DIAGNOSIS — D631 Anemia in chronic kidney disease: Secondary | ICD-10-CM | POA: Diagnosis not present

## 2023-04-24 DIAGNOSIS — N186 End stage renal disease: Secondary | ICD-10-CM | POA: Diagnosis not present

## 2023-04-24 DIAGNOSIS — E1129 Type 2 diabetes mellitus with other diabetic kidney complication: Secondary | ICD-10-CM | POA: Diagnosis not present

## 2023-04-24 DIAGNOSIS — N2581 Secondary hyperparathyroidism of renal origin: Secondary | ICD-10-CM | POA: Diagnosis not present

## 2023-04-24 DIAGNOSIS — R110 Nausea: Secondary | ICD-10-CM | POA: Diagnosis not present

## 2023-04-24 DIAGNOSIS — D689 Coagulation defect, unspecified: Secondary | ICD-10-CM | POA: Diagnosis not present

## 2023-04-24 DIAGNOSIS — D509 Iron deficiency anemia, unspecified: Secondary | ICD-10-CM | POA: Diagnosis not present

## 2023-04-26 DIAGNOSIS — Z992 Dependence on renal dialysis: Secondary | ICD-10-CM | POA: Diagnosis not present

## 2023-04-26 DIAGNOSIS — D689 Coagulation defect, unspecified: Secondary | ICD-10-CM | POA: Diagnosis not present

## 2023-04-26 DIAGNOSIS — E1129 Type 2 diabetes mellitus with other diabetic kidney complication: Secondary | ICD-10-CM | POA: Diagnosis not present

## 2023-04-26 DIAGNOSIS — N2581 Secondary hyperparathyroidism of renal origin: Secondary | ICD-10-CM | POA: Diagnosis not present

## 2023-04-26 DIAGNOSIS — R110 Nausea: Secondary | ICD-10-CM | POA: Diagnosis not present

## 2023-04-26 DIAGNOSIS — D631 Anemia in chronic kidney disease: Secondary | ICD-10-CM | POA: Diagnosis not present

## 2023-04-26 DIAGNOSIS — D509 Iron deficiency anemia, unspecified: Secondary | ICD-10-CM | POA: Diagnosis not present

## 2023-04-26 DIAGNOSIS — N186 End stage renal disease: Secondary | ICD-10-CM | POA: Diagnosis not present

## 2023-04-28 DIAGNOSIS — Z992 Dependence on renal dialysis: Secondary | ICD-10-CM | POA: Diagnosis not present

## 2023-04-28 DIAGNOSIS — N186 End stage renal disease: Secondary | ICD-10-CM | POA: Diagnosis not present

## 2023-04-28 DIAGNOSIS — E1129 Type 2 diabetes mellitus with other diabetic kidney complication: Secondary | ICD-10-CM | POA: Diagnosis not present

## 2023-04-29 DIAGNOSIS — N2581 Secondary hyperparathyroidism of renal origin: Secondary | ICD-10-CM | POA: Diagnosis not present

## 2023-04-29 DIAGNOSIS — D509 Iron deficiency anemia, unspecified: Secondary | ICD-10-CM | POA: Diagnosis not present

## 2023-04-29 DIAGNOSIS — D689 Coagulation defect, unspecified: Secondary | ICD-10-CM | POA: Diagnosis not present

## 2023-04-29 DIAGNOSIS — N186 End stage renal disease: Secondary | ICD-10-CM | POA: Diagnosis not present

## 2023-04-29 DIAGNOSIS — D631 Anemia in chronic kidney disease: Secondary | ICD-10-CM | POA: Diagnosis not present

## 2023-04-29 DIAGNOSIS — R251 Tremor, unspecified: Secondary | ICD-10-CM | POA: Diagnosis not present

## 2023-04-29 DIAGNOSIS — R110 Nausea: Secondary | ICD-10-CM | POA: Diagnosis not present

## 2023-04-29 DIAGNOSIS — Z992 Dependence on renal dialysis: Secondary | ICD-10-CM | POA: Diagnosis not present

## 2023-04-29 DIAGNOSIS — E1129 Type 2 diabetes mellitus with other diabetic kidney complication: Secondary | ICD-10-CM | POA: Diagnosis not present

## 2023-05-01 DIAGNOSIS — D631 Anemia in chronic kidney disease: Secondary | ICD-10-CM | POA: Diagnosis not present

## 2023-05-01 DIAGNOSIS — Z992 Dependence on renal dialysis: Secondary | ICD-10-CM | POA: Diagnosis not present

## 2023-05-01 DIAGNOSIS — R110 Nausea: Secondary | ICD-10-CM | POA: Diagnosis not present

## 2023-05-01 DIAGNOSIS — R251 Tremor, unspecified: Secondary | ICD-10-CM | POA: Diagnosis not present

## 2023-05-01 DIAGNOSIS — N2581 Secondary hyperparathyroidism of renal origin: Secondary | ICD-10-CM | POA: Diagnosis not present

## 2023-05-01 DIAGNOSIS — D689 Coagulation defect, unspecified: Secondary | ICD-10-CM | POA: Diagnosis not present

## 2023-05-01 DIAGNOSIS — E1129 Type 2 diabetes mellitus with other diabetic kidney complication: Secondary | ICD-10-CM | POA: Diagnosis not present

## 2023-05-01 DIAGNOSIS — N186 End stage renal disease: Secondary | ICD-10-CM | POA: Diagnosis not present

## 2023-05-01 DIAGNOSIS — D509 Iron deficiency anemia, unspecified: Secondary | ICD-10-CM | POA: Diagnosis not present

## 2023-05-03 DIAGNOSIS — E1129 Type 2 diabetes mellitus with other diabetic kidney complication: Secondary | ICD-10-CM | POA: Diagnosis not present

## 2023-05-03 DIAGNOSIS — R251 Tremor, unspecified: Secondary | ICD-10-CM | POA: Diagnosis not present

## 2023-05-03 DIAGNOSIS — N2581 Secondary hyperparathyroidism of renal origin: Secondary | ICD-10-CM | POA: Diagnosis not present

## 2023-05-03 DIAGNOSIS — R110 Nausea: Secondary | ICD-10-CM | POA: Diagnosis not present

## 2023-05-03 DIAGNOSIS — D631 Anemia in chronic kidney disease: Secondary | ICD-10-CM | POA: Diagnosis not present

## 2023-05-03 DIAGNOSIS — D509 Iron deficiency anemia, unspecified: Secondary | ICD-10-CM | POA: Diagnosis not present

## 2023-05-03 DIAGNOSIS — Z992 Dependence on renal dialysis: Secondary | ICD-10-CM | POA: Diagnosis not present

## 2023-05-03 DIAGNOSIS — D689 Coagulation defect, unspecified: Secondary | ICD-10-CM | POA: Diagnosis not present

## 2023-05-03 DIAGNOSIS — N186 End stage renal disease: Secondary | ICD-10-CM | POA: Diagnosis not present

## 2023-05-06 DIAGNOSIS — D689 Coagulation defect, unspecified: Secondary | ICD-10-CM | POA: Diagnosis not present

## 2023-05-06 DIAGNOSIS — N186 End stage renal disease: Secondary | ICD-10-CM | POA: Diagnosis not present

## 2023-05-06 DIAGNOSIS — R251 Tremor, unspecified: Secondary | ICD-10-CM | POA: Diagnosis not present

## 2023-05-06 DIAGNOSIS — R110 Nausea: Secondary | ICD-10-CM | POA: Diagnosis not present

## 2023-05-06 DIAGNOSIS — Z992 Dependence on renal dialysis: Secondary | ICD-10-CM | POA: Diagnosis not present

## 2023-05-06 DIAGNOSIS — E1129 Type 2 diabetes mellitus with other diabetic kidney complication: Secondary | ICD-10-CM | POA: Diagnosis not present

## 2023-05-06 DIAGNOSIS — N2581 Secondary hyperparathyroidism of renal origin: Secondary | ICD-10-CM | POA: Diagnosis not present

## 2023-05-06 DIAGNOSIS — D509 Iron deficiency anemia, unspecified: Secondary | ICD-10-CM | POA: Diagnosis not present

## 2023-05-06 DIAGNOSIS — D631 Anemia in chronic kidney disease: Secondary | ICD-10-CM | POA: Diagnosis not present

## 2023-05-07 DIAGNOSIS — G629 Polyneuropathy, unspecified: Secondary | ICD-10-CM | POA: Diagnosis not present

## 2023-05-07 DIAGNOSIS — I120 Hypertensive chronic kidney disease with stage 5 chronic kidney disease or end stage renal disease: Secondary | ICD-10-CM | POA: Diagnosis not present

## 2023-05-07 DIAGNOSIS — H409 Unspecified glaucoma: Secondary | ICD-10-CM | POA: Diagnosis not present

## 2023-05-07 DIAGNOSIS — I2510 Atherosclerotic heart disease of native coronary artery without angina pectoris: Secondary | ICD-10-CM | POA: Diagnosis not present

## 2023-05-07 DIAGNOSIS — R2681 Unsteadiness on feet: Secondary | ICD-10-CM | POA: Diagnosis not present

## 2023-05-07 DIAGNOSIS — N186 End stage renal disease: Secondary | ICD-10-CM | POA: Diagnosis not present

## 2023-05-07 DIAGNOSIS — R011 Cardiac murmur, unspecified: Secondary | ICD-10-CM | POA: Diagnosis not present

## 2023-05-07 DIAGNOSIS — Z992 Dependence on renal dialysis: Secondary | ICD-10-CM | POA: Diagnosis not present

## 2023-05-08 DIAGNOSIS — N186 End stage renal disease: Secondary | ICD-10-CM | POA: Diagnosis not present

## 2023-05-08 DIAGNOSIS — N2581 Secondary hyperparathyroidism of renal origin: Secondary | ICD-10-CM | POA: Diagnosis not present

## 2023-05-08 DIAGNOSIS — D509 Iron deficiency anemia, unspecified: Secondary | ICD-10-CM | POA: Diagnosis not present

## 2023-05-08 DIAGNOSIS — D689 Coagulation defect, unspecified: Secondary | ICD-10-CM | POA: Diagnosis not present

## 2023-05-08 DIAGNOSIS — D631 Anemia in chronic kidney disease: Secondary | ICD-10-CM | POA: Diagnosis not present

## 2023-05-08 DIAGNOSIS — E1129 Type 2 diabetes mellitus with other diabetic kidney complication: Secondary | ICD-10-CM | POA: Diagnosis not present

## 2023-05-08 DIAGNOSIS — Z992 Dependence on renal dialysis: Secondary | ICD-10-CM | POA: Diagnosis not present

## 2023-05-08 DIAGNOSIS — R251 Tremor, unspecified: Secondary | ICD-10-CM | POA: Diagnosis not present

## 2023-05-08 DIAGNOSIS — R110 Nausea: Secondary | ICD-10-CM | POA: Diagnosis not present

## 2023-05-10 DIAGNOSIS — D689 Coagulation defect, unspecified: Secondary | ICD-10-CM | POA: Diagnosis not present

## 2023-05-10 DIAGNOSIS — R251 Tremor, unspecified: Secondary | ICD-10-CM | POA: Diagnosis not present

## 2023-05-10 DIAGNOSIS — Z992 Dependence on renal dialysis: Secondary | ICD-10-CM | POA: Diagnosis not present

## 2023-05-10 DIAGNOSIS — R110 Nausea: Secondary | ICD-10-CM | POA: Diagnosis not present

## 2023-05-10 DIAGNOSIS — D509 Iron deficiency anemia, unspecified: Secondary | ICD-10-CM | POA: Diagnosis not present

## 2023-05-10 DIAGNOSIS — N2581 Secondary hyperparathyroidism of renal origin: Secondary | ICD-10-CM | POA: Diagnosis not present

## 2023-05-10 DIAGNOSIS — N186 End stage renal disease: Secondary | ICD-10-CM | POA: Diagnosis not present

## 2023-05-10 DIAGNOSIS — E1129 Type 2 diabetes mellitus with other diabetic kidney complication: Secondary | ICD-10-CM | POA: Diagnosis not present

## 2023-05-10 DIAGNOSIS — D631 Anemia in chronic kidney disease: Secondary | ICD-10-CM | POA: Diagnosis not present

## 2023-05-13 DIAGNOSIS — N186 End stage renal disease: Secondary | ICD-10-CM | POA: Diagnosis not present

## 2023-05-13 DIAGNOSIS — Z992 Dependence on renal dialysis: Secondary | ICD-10-CM | POA: Diagnosis not present

## 2023-05-13 DIAGNOSIS — D631 Anemia in chronic kidney disease: Secondary | ICD-10-CM | POA: Diagnosis not present

## 2023-05-13 DIAGNOSIS — R251 Tremor, unspecified: Secondary | ICD-10-CM | POA: Diagnosis not present

## 2023-05-13 DIAGNOSIS — D509 Iron deficiency anemia, unspecified: Secondary | ICD-10-CM | POA: Diagnosis not present

## 2023-05-13 DIAGNOSIS — R110 Nausea: Secondary | ICD-10-CM | POA: Diagnosis not present

## 2023-05-13 DIAGNOSIS — D689 Coagulation defect, unspecified: Secondary | ICD-10-CM | POA: Diagnosis not present

## 2023-05-13 DIAGNOSIS — N2581 Secondary hyperparathyroidism of renal origin: Secondary | ICD-10-CM | POA: Diagnosis not present

## 2023-05-13 DIAGNOSIS — E1129 Type 2 diabetes mellitus with other diabetic kidney complication: Secondary | ICD-10-CM | POA: Diagnosis not present

## 2023-05-15 DIAGNOSIS — N186 End stage renal disease: Secondary | ICD-10-CM | POA: Diagnosis not present

## 2023-05-15 DIAGNOSIS — D509 Iron deficiency anemia, unspecified: Secondary | ICD-10-CM | POA: Diagnosis not present

## 2023-05-15 DIAGNOSIS — R110 Nausea: Secondary | ICD-10-CM | POA: Diagnosis not present

## 2023-05-15 DIAGNOSIS — D631 Anemia in chronic kidney disease: Secondary | ICD-10-CM | POA: Diagnosis not present

## 2023-05-15 DIAGNOSIS — N2581 Secondary hyperparathyroidism of renal origin: Secondary | ICD-10-CM | POA: Diagnosis not present

## 2023-05-15 DIAGNOSIS — E1129 Type 2 diabetes mellitus with other diabetic kidney complication: Secondary | ICD-10-CM | POA: Diagnosis not present

## 2023-05-15 DIAGNOSIS — D689 Coagulation defect, unspecified: Secondary | ICD-10-CM | POA: Diagnosis not present

## 2023-05-15 DIAGNOSIS — R251 Tremor, unspecified: Secondary | ICD-10-CM | POA: Diagnosis not present

## 2023-05-15 DIAGNOSIS — Z992 Dependence on renal dialysis: Secondary | ICD-10-CM | POA: Diagnosis not present

## 2023-05-17 DIAGNOSIS — E1129 Type 2 diabetes mellitus with other diabetic kidney complication: Secondary | ICD-10-CM | POA: Diagnosis not present

## 2023-05-17 DIAGNOSIS — R110 Nausea: Secondary | ICD-10-CM | POA: Diagnosis not present

## 2023-05-17 DIAGNOSIS — Z992 Dependence on renal dialysis: Secondary | ICD-10-CM | POA: Diagnosis not present

## 2023-05-17 DIAGNOSIS — D631 Anemia in chronic kidney disease: Secondary | ICD-10-CM | POA: Diagnosis not present

## 2023-05-17 DIAGNOSIS — R251 Tremor, unspecified: Secondary | ICD-10-CM | POA: Diagnosis not present

## 2023-05-17 DIAGNOSIS — N2581 Secondary hyperparathyroidism of renal origin: Secondary | ICD-10-CM | POA: Diagnosis not present

## 2023-05-17 DIAGNOSIS — D689 Coagulation defect, unspecified: Secondary | ICD-10-CM | POA: Diagnosis not present

## 2023-05-17 DIAGNOSIS — D509 Iron deficiency anemia, unspecified: Secondary | ICD-10-CM | POA: Diagnosis not present

## 2023-05-17 DIAGNOSIS — N186 End stage renal disease: Secondary | ICD-10-CM | POA: Diagnosis not present

## 2023-05-20 ENCOUNTER — Inpatient Hospital Stay (HOSPITAL_COMMUNITY)
Admission: EM | Admit: 2023-05-20 | Discharge: 2023-06-11 | DRG: 617 | Disposition: A | Discharge status: Skilled Nursing Facility | Payer: Medicare Other | Attending: Pediatrics | Admitting: Pediatrics

## 2023-05-20 ENCOUNTER — Emergency Department (HOSPITAL_COMMUNITY): Payer: Medicare Other

## 2023-05-20 ENCOUNTER — Other Ambulatory Visit: Payer: Self-pay

## 2023-05-20 DIAGNOSIS — E1142 Type 2 diabetes mellitus with diabetic polyneuropathy: Secondary | ICD-10-CM | POA: Diagnosis present

## 2023-05-20 DIAGNOSIS — D638 Anemia in other chronic diseases classified elsewhere: Secondary | ICD-10-CM | POA: Diagnosis present

## 2023-05-20 DIAGNOSIS — Z743 Need for continuous supervision: Secondary | ICD-10-CM | POA: Diagnosis not present

## 2023-05-20 DIAGNOSIS — Z87891 Personal history of nicotine dependence: Secondary | ICD-10-CM

## 2023-05-20 DIAGNOSIS — R58 Hemorrhage, not elsewhere classified: Secondary | ICD-10-CM | POA: Diagnosis not present

## 2023-05-20 DIAGNOSIS — R918 Other nonspecific abnormal finding of lung field: Secondary | ICD-10-CM | POA: Insufficient documentation

## 2023-05-20 DIAGNOSIS — R6889 Other general symptoms and signs: Secondary | ICD-10-CM | POA: Diagnosis not present

## 2023-05-20 DIAGNOSIS — Z7902 Long term (current) use of antithrombotics/antiplatelets: Secondary | ICD-10-CM

## 2023-05-20 DIAGNOSIS — Z634 Disappearance and death of family member: Secondary | ICD-10-CM

## 2023-05-20 DIAGNOSIS — N200 Calculus of kidney: Secondary | ICD-10-CM | POA: Diagnosis not present

## 2023-05-20 DIAGNOSIS — E1169 Type 2 diabetes mellitus with other specified complication: Principal | ICD-10-CM | POA: Diagnosis present

## 2023-05-20 DIAGNOSIS — E119 Type 2 diabetes mellitus without complications: Secondary | ICD-10-CM | POA: Diagnosis not present

## 2023-05-20 DIAGNOSIS — R531 Weakness: Secondary | ICD-10-CM | POA: Diagnosis not present

## 2023-05-20 DIAGNOSIS — D689 Coagulation defect, unspecified: Secondary | ICD-10-CM | POA: Diagnosis not present

## 2023-05-20 DIAGNOSIS — L97423 Non-pressure chronic ulcer of left heel and midfoot with necrosis of muscle: Secondary | ICD-10-CM | POA: Diagnosis present

## 2023-05-20 DIAGNOSIS — R110 Nausea: Secondary | ICD-10-CM | POA: Diagnosis not present

## 2023-05-20 DIAGNOSIS — D72829 Elevated white blood cell count, unspecified: Secondary | ICD-10-CM | POA: Diagnosis not present

## 2023-05-20 DIAGNOSIS — Z751 Person awaiting admission to adequate facility elsewhere: Secondary | ICD-10-CM

## 2023-05-20 DIAGNOSIS — I480 Paroxysmal atrial fibrillation: Secondary | ICD-10-CM | POA: Diagnosis not present

## 2023-05-20 DIAGNOSIS — D849 Immunodeficiency, unspecified: Secondary | ICD-10-CM | POA: Diagnosis not present

## 2023-05-20 DIAGNOSIS — I5032 Chronic diastolic (congestive) heart failure: Secondary | ICD-10-CM | POA: Diagnosis not present

## 2023-05-20 DIAGNOSIS — M25551 Pain in right hip: Secondary | ICD-10-CM | POA: Diagnosis present

## 2023-05-20 DIAGNOSIS — Z794 Long term (current) use of insulin: Secondary | ICD-10-CM

## 2023-05-20 DIAGNOSIS — R197 Diarrhea, unspecified: Secondary | ICD-10-CM | POA: Diagnosis present

## 2023-05-20 DIAGNOSIS — Z91148 Patient's other noncompliance with medication regimen for other reason: Secondary | ICD-10-CM

## 2023-05-20 DIAGNOSIS — E876 Hypokalemia: Secondary | ICD-10-CM | POA: Diagnosis present

## 2023-05-20 DIAGNOSIS — E871 Hypo-osmolality and hyponatremia: Secondary | ICD-10-CM | POA: Diagnosis not present

## 2023-05-20 DIAGNOSIS — D631 Anemia in chronic kidney disease: Secondary | ICD-10-CM | POA: Diagnosis present

## 2023-05-20 DIAGNOSIS — I10 Essential (primary) hypertension: Secondary | ICD-10-CM | POA: Diagnosis not present

## 2023-05-20 DIAGNOSIS — Z89421 Acquired absence of other right toe(s): Secondary | ICD-10-CM

## 2023-05-20 DIAGNOSIS — M86172 Other acute osteomyelitis, left ankle and foot: Secondary | ICD-10-CM

## 2023-05-20 DIAGNOSIS — N186 End stage renal disease: Secondary | ICD-10-CM | POA: Diagnosis not present

## 2023-05-20 DIAGNOSIS — Z79899 Other long term (current) drug therapy: Secondary | ICD-10-CM

## 2023-05-20 DIAGNOSIS — N2581 Secondary hyperparathyroidism of renal origin: Secondary | ICD-10-CM | POA: Diagnosis not present

## 2023-05-20 DIAGNOSIS — Z1152 Encounter for screening for COVID-19: Secondary | ICD-10-CM | POA: Diagnosis not present

## 2023-05-20 DIAGNOSIS — E1152 Type 2 diabetes mellitus with diabetic peripheral angiopathy with gangrene: Secondary | ICD-10-CM | POA: Diagnosis present

## 2023-05-20 DIAGNOSIS — K648 Other hemorrhoids: Secondary | ICD-10-CM | POA: Diagnosis not present

## 2023-05-20 DIAGNOSIS — M797 Fibromyalgia: Secondary | ICD-10-CM | POA: Diagnosis present

## 2023-05-20 DIAGNOSIS — E1122 Type 2 diabetes mellitus with diabetic chronic kidney disease: Secondary | ICD-10-CM | POA: Diagnosis present

## 2023-05-20 DIAGNOSIS — R109 Unspecified abdominal pain: Secondary | ICD-10-CM | POA: Diagnosis not present

## 2023-05-20 DIAGNOSIS — E11621 Type 2 diabetes mellitus with foot ulcer: Secondary | ICD-10-CM | POA: Diagnosis present

## 2023-05-20 DIAGNOSIS — Z8679 Personal history of other diseases of the circulatory system: Secondary | ICD-10-CM | POA: Diagnosis not present

## 2023-05-20 DIAGNOSIS — L8990 Pressure ulcer of unspecified site, unspecified stage: Secondary | ICD-10-CM | POA: Insufficient documentation

## 2023-05-20 DIAGNOSIS — M159 Polyosteoarthritis, unspecified: Secondary | ICD-10-CM | POA: Diagnosis present

## 2023-05-20 DIAGNOSIS — Z8249 Family history of ischemic heart disease and other diseases of the circulatory system: Secondary | ICD-10-CM

## 2023-05-20 DIAGNOSIS — K219 Gastro-esophageal reflux disease without esophagitis: Secondary | ICD-10-CM | POA: Diagnosis present

## 2023-05-20 DIAGNOSIS — I4819 Other persistent atrial fibrillation: Secondary | ICD-10-CM | POA: Diagnosis not present

## 2023-05-20 DIAGNOSIS — H409 Unspecified glaucoma: Secondary | ICD-10-CM | POA: Diagnosis present

## 2023-05-20 DIAGNOSIS — Z992 Dependence on renal dialysis: Secondary | ICD-10-CM | POA: Diagnosis not present

## 2023-05-20 DIAGNOSIS — F4321 Adjustment disorder with depressed mood: Secondary | ICD-10-CM | POA: Diagnosis present

## 2023-05-20 DIAGNOSIS — E43 Unspecified severe protein-calorie malnutrition: Secondary | ICD-10-CM | POA: Diagnosis present

## 2023-05-20 DIAGNOSIS — B999 Unspecified infectious disease: Secondary | ICD-10-CM | POA: Diagnosis present

## 2023-05-20 DIAGNOSIS — I739 Peripheral vascular disease, unspecified: Secondary | ICD-10-CM | POA: Diagnosis not present

## 2023-05-20 DIAGNOSIS — E113592 Type 2 diabetes mellitus with proliferative diabetic retinopathy without macular edema, left eye: Secondary | ICD-10-CM | POA: Diagnosis not present

## 2023-05-20 DIAGNOSIS — I132 Hypertensive heart and chronic kidney disease with heart failure and with stage 5 chronic kidney disease, or end stage renal disease: Secondary | ICD-10-CM | POA: Diagnosis present

## 2023-05-20 DIAGNOSIS — I252 Old myocardial infarction: Secondary | ICD-10-CM

## 2023-05-20 DIAGNOSIS — R509 Fever, unspecified: Secondary | ICD-10-CM | POA: Diagnosis not present

## 2023-05-20 DIAGNOSIS — N400 Enlarged prostate without lower urinary tract symptoms: Secondary | ICD-10-CM | POA: Diagnosis present

## 2023-05-20 DIAGNOSIS — R9431 Abnormal electrocardiogram [ECG] [EKG]: Secondary | ICD-10-CM | POA: Diagnosis not present

## 2023-05-20 DIAGNOSIS — I2510 Atherosclerotic heart disease of native coronary artery without angina pectoris: Secondary | ICD-10-CM | POA: Diagnosis not present

## 2023-05-20 DIAGNOSIS — E113291 Type 2 diabetes mellitus with mild nonproliferative diabetic retinopathy without macular edema, right eye: Secondary | ICD-10-CM | POA: Diagnosis not present

## 2023-05-20 DIAGNOSIS — E1129 Type 2 diabetes mellitus with other diabetic kidney complication: Secondary | ICD-10-CM | POA: Diagnosis not present

## 2023-05-20 DIAGNOSIS — R251 Tremor, unspecified: Secondary | ICD-10-CM | POA: Diagnosis not present

## 2023-05-20 DIAGNOSIS — Z96651 Presence of right artificial knee joint: Secondary | ICD-10-CM | POA: Diagnosis present

## 2023-05-20 DIAGNOSIS — E785 Hyperlipidemia, unspecified: Secondary | ICD-10-CM | POA: Diagnosis present

## 2023-05-20 DIAGNOSIS — R911 Solitary pulmonary nodule: Secondary | ICD-10-CM | POA: Diagnosis present

## 2023-05-20 DIAGNOSIS — K8020 Calculus of gallbladder without cholecystitis without obstruction: Secondary | ICD-10-CM | POA: Diagnosis not present

## 2023-05-20 DIAGNOSIS — Z7901 Long term (current) use of anticoagulants: Secondary | ICD-10-CM

## 2023-05-20 DIAGNOSIS — Z602 Problems related to living alone: Secondary | ICD-10-CM | POA: Diagnosis present

## 2023-05-20 DIAGNOSIS — M898X9 Other specified disorders of bone, unspecified site: Secondary | ICD-10-CM | POA: Diagnosis present

## 2023-05-20 DIAGNOSIS — I517 Cardiomegaly: Secondary | ICD-10-CM | POA: Diagnosis not present

## 2023-05-20 DIAGNOSIS — M791 Myalgia, unspecified site: Secondary | ICD-10-CM | POA: Diagnosis not present

## 2023-05-20 DIAGNOSIS — R5381 Other malaise: Secondary | ICD-10-CM | POA: Diagnosis not present

## 2023-05-20 DIAGNOSIS — Z833 Family history of diabetes mellitus: Secondary | ICD-10-CM

## 2023-05-20 DIAGNOSIS — D509 Iron deficiency anemia, unspecified: Secondary | ICD-10-CM | POA: Diagnosis not present

## 2023-05-20 DIAGNOSIS — I38 Endocarditis, valve unspecified: Secondary | ICD-10-CM | POA: Diagnosis not present

## 2023-05-20 LAB — COMPREHENSIVE METABOLIC PANEL
ALT: 10 U/L (ref 0–44)
AST: 20 U/L (ref 15–41)
Albumin: 2.7 g/dL — ABNORMAL LOW (ref 3.5–5.0)
Alkaline Phosphatase: 64 U/L (ref 38–126)
Anion gap: 17 — ABNORMAL HIGH (ref 5–15)
BUN: 23 mg/dL (ref 8–23)
CO2: 31 mmol/L (ref 22–32)
Calcium: 8.4 mg/dL — ABNORMAL LOW (ref 8.9–10.3)
Chloride: 91 mmol/L — ABNORMAL LOW (ref 98–111)
Creatinine, Ser: 4.75 mg/dL — ABNORMAL HIGH (ref 0.61–1.24)
GFR, Estimated: 12 mL/min — ABNORMAL LOW (ref >60)
Glucose, Bld: 124 mg/dL — ABNORMAL HIGH (ref 70–99)
Potassium: 2.9 mmol/L — ABNORMAL LOW (ref 3.5–5.1)
Sodium: 139 mmol/L (ref 135–145)
Total Bilirubin: 0.8 mg/dL (ref 0.3–1.2)
Total Protein: 6.9 g/dL (ref 6.5–8.1)

## 2023-05-20 LAB — CBC WITH DIFFERENTIAL/PLATELET
Abs Immature Granulocytes: 0.11 10*3/uL — ABNORMAL HIGH (ref 0.00–0.07)
Basophils Absolute: 0 10*3/uL (ref 0.0–0.1)
Basophils Relative: 0 %
Eosinophils Absolute: 0 10*3/uL (ref 0.0–0.5)
Eosinophils Relative: 0 %
HCT: 32.8 % — ABNORMAL LOW (ref 39.0–52.0)
Hemoglobin: 10.5 g/dL — ABNORMAL LOW (ref 13.0–17.0)
Immature Granulocytes: 1 %
Lymphocytes Relative: 3 %
Lymphs Abs: 0.4 10*3/uL — ABNORMAL LOW (ref 0.7–4.0)
MCH: 30.6 pg (ref 26.0–34.0)
MCHC: 32 g/dL (ref 30.0–36.0)
MCV: 95.6 fL (ref 80.0–100.0)
Monocytes Absolute: 1.2 10*3/uL — ABNORMAL HIGH (ref 0.1–1.0)
Monocytes Relative: 9 %
Neutro Abs: 12 10*3/uL — ABNORMAL HIGH (ref 1.7–7.7)
Neutrophils Relative %: 87 %
Platelets: 162 10*3/uL (ref 150–400)
RBC: 3.43 MIL/uL — ABNORMAL LOW (ref 4.22–5.81)
RDW: 14.6 % (ref 11.5–15.5)
WBC: 13.8 10*3/uL — ABNORMAL HIGH (ref 4.0–10.5)
nRBC: 0 % (ref 0.0–0.2)

## 2023-05-20 LAB — PROTIME-INR
INR: 1.4 — ABNORMAL HIGH (ref 0.8–1.2)
Prothrombin Time: 17.5 seconds — ABNORMAL HIGH (ref 11.4–15.2)

## 2023-05-20 LAB — SARS CORONAVIRUS 2 BY RT PCR: SARS Coronavirus 2 by RT PCR: NEGATIVE

## 2023-05-20 LAB — I-STAT CG4 LACTIC ACID, ED: Lactic Acid, Venous: 1.1 mmol/L (ref 0.5–1.9)

## 2023-05-20 LAB — APTT: aPTT: 40 seconds — ABNORMAL HIGH (ref 24–36)

## 2023-05-20 MED ORDER — ACETAMINOPHEN 325 MG PO TABS
ORAL_TABLET | ORAL | Status: AC
  Filled 2023-05-20: qty 2

## 2023-05-20 MED ORDER — ACETAMINOPHEN 325 MG PO TABS
650.0000 mg | ORAL_TABLET | Freq: Once | ORAL | Status: AC
  Administered 2023-05-20: 650 mg via ORAL

## 2023-05-20 MED ORDER — SODIUM CHLORIDE 0.9 % IV SOLN
1.0000 g | Freq: Once | INTRAVENOUS | Status: AC
  Administered 2023-05-20: 1 g via INTRAVENOUS
  Filled 2023-05-20: qty 10

## 2023-05-20 NOTE — ED Triage Notes (Signed)
Pt from home c/o 3 day hx of weakness. Dialysis pt M,W,F, had full treatment today. Has has had diarrhea x 1 day, temp on arrival 102.8 oral

## 2023-05-20 NOTE — ED Provider Notes (Signed)
[place] EMERGENCY DEPARTMENT AT [facility] Provider Note   CSN: [id number] Arrival date & time: 05/20/23  2137     History {Add pertinent medical, surgical, social history, OB history to HPI:1} Chief Complaint  Patient presents with   Fatigue    William Webb is a 72 y.o. male.  72 year old male on Eliquis for a fib, CHF (last echo on file 03/22/23 with EF 50%), CAD, HTN, HLD, DM, ESRD (M/W/F dialysis, last full session today, patient does make urine), presents from home with fever, decreased PO intake, diarrhea with report of blood in stools. Symptoms started on Thursday. Patient is here with his nephew who states he took dinner over to the patient at 5pm today, patient did not appear to be feeling well so he called him this evening to check on him and the patient did not seem to be speaking clearly. Speech normal at this time. Nephew also adds recent admissions for anemia with baseline hgb of 11 and found to have hgb that day of 7.        Home Medications Prior to Admission medications   Medication Sig Start Date End Date Taking? Authorizing Provider  acetaminophen (TYLENOL) 650 MG CR tablet Take 1,300 mg by mouth at bedtime.    [provider]  Alcohol Swabs (B-D SINGLE USE SWABS REGULAR) PADS  04/22/20   [provider]  amLODipine (NORVASC) 5 MG tablet Take 5 mg by mouth daily. 01/19/23   [provider]  apixaban (ELIQUIS) 5 MG TABS tablet Take 1 tablet (5 mg total) by mouth 2 (two) times daily. 01/10/23   Patwardhan, Anabel Bene, MD  atorvastatin (LIPITOR) 80 MG tablet Take 1 tablet (80 mg total) by mouth daily. 03/20/22 03/21/23  Lanae Boast, MD  B Complex-C-Zn-Folic Acid (DIALYVITE 800 WITH ZINC) 0.8 MG TABS SMARTSIG:1 Tablet(s) By Mouth Every Evening 03/30/22   [provider]  Blood Glucose Monitoring Suppl (ACCU-CHEK GUIDE ME) w/Device KIT 1 Device by Does not apply route 4 (four) times daily -  before meals and at bedtime.  11/19/18   Deeann Saint, MD  Calcium Carbonate-Simethicone (ALKA-SELTZER HEARTBURN + GAS) 750-80 MG CHEW Chew 1 tablet by mouth 3 (three) times daily as needed (indigestion/heartburn.).    [provider]  carvedilol (COREG) 25 MG tablet Take 25 mg by mouth 2 (two) times daily. 09/04/21   [provider]  clopidogrel (PLAVIX) 75 MG tablet Take 75 mg by mouth daily. 01/19/23   [provider]  Continuous Blood Gluc Sensor (DEXCOM G6 SENSOR) MISC 1 Device by Does not apply route See admin instructions. Change every 10 days 01/31/21   Romero Belling, MD  Continuous Blood Gluc Sensor (DEXCOM G6 SENSOR) MISC 1 Device by Does not apply route See admin instructions. Change every 10 days 02/02/21   Romero Belling, MD  Cyanocobalamin (VITAMIN B-12) 5000 MCG TBDP Take 5,000 mcg by mouth in the morning.    [provider]  Dextromethorphan-guaiFENesin (CORICIDIN HBP CONGESTION/COUGH) 10-200 MG CAPS Take 2 capsules by mouth daily as needed (cough/congestion).    [provider]  finasteride (PROSCAR) 5 MG tablet Take 5 mg by mouth daily. 11/08/22   [provider]  furosemide (LASIX) 80 MG tablet Take 80 mg by mouth See admin instructions. Take 80 mg by mouth twice a day and additional 80 mg before bedtime as needed for swelling of the ankles    [provider]  gabapentin (NEURONTIN) 600 MG tablet Take  0.5 tablets (300 mg total) by mouth 2 (two) times daily. 08/02/20   Calvert Cantor, MD  GEBAUERS PAIN EASE AERO Apply topically. 03/30/22   [provider]  glucose 4 GM chewable tablet Chew 1 tablet by mouth as needed for low blood sugar.    [provider]  hydrALAZINE (APRESOLINE) 100 MG tablet Take 1 tablet (100 mg total) by mouth 3 (three) times daily. 01/02/23 03/21/23  Patwardhan, Anabel Bene, MD  hydrocortisone (ANUSOL-HC) 25 MG suppository Place 1 suppository (25 mg total) rectally 2 (two) times daily. 03/23/23   Ghimire, Werner Lean, MD   isosorbide mononitrate (IMDUR) 60 MG 24 hr tablet Take 1 tablet (60 mg total) by mouth daily. 08/03/20   Calvert Cantor, MD  ketorolac (ACULAR) 0.5 % ophthalmic solution Place 1 drop into both eyes daily as needed (inflamation). 11/22/20   [provider]  Lidocaine 4 % AERO Apply 1 spray topically as needed (prior to port being accessed.).    [provider]  lidocaine-prilocaine (EMLA) cream 1 application as needed (prior to port being accessed). 07/05/21   [provider]  linaclotide (LINZESS) 145 MCG CAPS capsule Take 145 mcg by mouth daily before breakfast.    [provider]  loratadine (CLARITIN) 10 MG tablet Take 10 mg by mouth daily as needed for allergies.    [provider]  losartan (COZAAR) 100 MG tablet Take 1 tablet (100 mg total) by mouth daily. Patient taking differently: Take 100 mg by mouth at bedtime. 12/19/22 03/21/23  Patwardhan, Manish J, MD  NOVOLOG FLEXPEN 100 UNIT/ML FlexPen INJECT 3 UNITS INTO THE SKIN 3 (THREE) TIMES DAILY WITH MEALS. 08/31/20   Romero Belling, MD  omeprazole (PRILOSEC) 20 MG capsule Take 20 mg by mouth daily.    [provider]  polyethylene glycol powder (GLYCOLAX/MIRALAX) 17 GM/SCOOP powder Take 17 g by mouth in the morning, at noon, and at bedtime. One scoop in beverage of your choice with each meal. You may increase if you continue to be constipated and decrease if your stool becomes too loose. 02/27/23   Gailen Shelter, PA  rosuvastatin (CRESTOR) 10 MG tablet Take 10 mg by mouth daily. 09/14/22   [provider]  sevelamer carbonate (RENVELA) 800 MG tablet Take 800 mg by mouth with breakfast, with lunch, and with evening meal. 01/10/21   [provider]  tamsulosin (FLOMAX) 0.4 MG CAPS capsule Take 0.4 mg by mouth at bedtime. 11/16/19   [provider]  VELPHORO 500 MG chewable tablet Chew 500 mg by mouth 3 (three) times daily. 02/14/22   [provider]  vitamin E 1000  UNIT capsule Take 1,000 Units by mouth daily.    [provider]      Allergies    Patient has no known allergies.    Review of Systems   Review of Systems Negative except as per HPI Physical Exam Updated Vital Signs BP (!) 145/61 (BP Location: Right Arm)   Pulse 82   Temp (!) 103 F (39.4 C) (Oral)   Resp 16   Ht 6\' 3"  (1.905 m)   Wt 83.2 kg   SpO2 100%   BMI 22.93 kg/m  Physical Exam Vitals and nursing note reviewed.  Constitutional:      General: He is not in acute distress.    Appearance: He is well-developed. He is not diaphoretic.  HENT:     Head: Normocephalic and atraumatic.     Mouth/Throat:     Mouth:  Mucous membranes are moist.  Eyes:     Conjunctiva/sclera: Conjunctivae normal.  Cardiovascular:     Rate and Rhythm: Normal rate and regular rhythm.     Heart sounds: Normal heart sounds.  Pulmonary:     Effort: Pulmonary effort is normal.     Breath sounds: Normal breath sounds.  Abdominal:     Palpations: Abdomen is soft.     Tenderness: There is no abdominal tenderness.  Musculoskeletal:     Right lower leg: Edema present.     Left lower leg: Edema present.     Comments: L>R lower extremity edema, pt reports this to be normal for him  Skin:    General: Skin is warm and dry.     Findings: No erythema or rash.  Neurological:     Mental Status: He is alert and oriented to person, place, and time.  Psychiatric:        Behavior: Behavior normal.     ED Results / Procedures / Treatments   Labs (all labs ordered are listed, but only abnormal results are displayed) Labs Reviewed  CULTURE, BLOOD (ROUTINE X 2)  CULTURE, BLOOD (ROUTINE X 2)  SARS CORONAVIRUS 2 BY RT PCR  URINE CULTURE  COMPREHENSIVE METABOLIC PANEL  CBC WITH DIFFERENTIAL/PLATELET  PROTIME-INR  URINALYSIS, W/ REFLEX TO CULTURE (INFECTION SUSPECTED)  APTT  I-STAT CG4 LACTIC ACID, ED    EKG None  Radiology No results found.  Procedures Procedures  {Document cardiac  monitor, telemetry assessment procedure when appropriate:1}  Medications Ordered in ED Medications  acetaminophen (TYLENOL) tablet 650 mg (has no administration in time range)    ED Course/ Medical Decision Making/ A&P   {   Click here for ABCD2, HEART and other calculatorsREFRESH Note before signing :1}                          Medical Decision Making Amount and/or Complexity of Data Reviewed Labs: ordered. ECG/medicine tests: ordered.  Risk OTC drugs.   This patient presents to the ED for concern of ***, this involves an extensive number of treatment options, and is a complaint that carries with it a high risk of complications and morbidity.  The differential diagnosis includes ***   Co morbidities that complicate the patient evaluation  ***   Additional history obtained:  Additional history obtained from *** External records from outside source obtained and reviewed including ***   Lab Tests:  I Ordered, and personally interpreted labs.  The pertinent results include:  ***   Imaging Studies ordered:  I ordered imaging studies including ***  I independently visualized and interpreted imaging which showed *** I agree with the radiologist interpretation   Cardiac Monitoring: / EKG:  The patient was maintained on a cardiac monitor.  I personally viewed and interpreted the cardiac monitored which showed an underlying rhythm of: ***   Consultations Obtained:  I requested consultation with the ***,  and discussed lab and imaging findings as well as pertinent plan - they recommend: ***   Problem List / ED Course / Critical interventions / Medication management  *** I ordered medication including ***  for ***  Reevaluation of the patient after these medicines showed that the patient {resolved/improved/worsened:23923::"improved"} I have reviewed the patients home medicines and have made adjustments as needed   Social Determinants of Health:  ***   Test /  Admission - Considered:  ***   {Document critical care time when appropriate:1} {Document  review of labs and clinical decision tools ie heart score, Chads2Vasc2 etc:1}  {Document your independent review of radiology images, and any outside records:1} {Document your discussion with family members, caretakers, and with consultants:1} {Document social determinants of health affecting pt's care:1} {Document your decision making why or why not admission, treatments were needed:1} Final Clinical Impression(s) / ED Diagnoses Final diagnoses:  None    Rx / DC Orders ED Discharge Orders     None

## 2023-05-21 ENCOUNTER — Emergency Department (HOSPITAL_COMMUNITY): Payer: Medicare Other

## 2023-05-21 ENCOUNTER — Encounter (HOSPITAL_COMMUNITY): Payer: Self-pay

## 2023-05-21 DIAGNOSIS — D638 Anemia in other chronic diseases classified elsewhere: Secondary | ICD-10-CM | POA: Diagnosis not present

## 2023-05-21 DIAGNOSIS — I5032 Chronic diastolic (congestive) heart failure: Secondary | ICD-10-CM | POA: Diagnosis not present

## 2023-05-21 DIAGNOSIS — D72829 Elevated white blood cell count, unspecified: Secondary | ICD-10-CM

## 2023-05-21 DIAGNOSIS — Z8679 Personal history of other diseases of the circulatory system: Secondary | ICD-10-CM | POA: Diagnosis not present

## 2023-05-21 DIAGNOSIS — L089 Local infection of the skin and subcutaneous tissue, unspecified: Secondary | ICD-10-CM | POA: Diagnosis not present

## 2023-05-21 DIAGNOSIS — L97829 Non-pressure chronic ulcer of other part of left lower leg with unspecified severity: Secondary | ICD-10-CM | POA: Diagnosis not present

## 2023-05-21 DIAGNOSIS — M1611 Unilateral primary osteoarthritis, right hip: Secondary | ICD-10-CM | POA: Diagnosis not present

## 2023-05-21 DIAGNOSIS — R911 Solitary pulmonary nodule: Secondary | ICD-10-CM | POA: Diagnosis not present

## 2023-05-21 DIAGNOSIS — E119 Type 2 diabetes mellitus without complications: Secondary | ICD-10-CM | POA: Diagnosis not present

## 2023-05-21 DIAGNOSIS — I96 Gangrene, not elsewhere classified: Secondary | ICD-10-CM | POA: Diagnosis not present

## 2023-05-21 DIAGNOSIS — M868X7 Other osteomyelitis, ankle and foot: Secondary | ICD-10-CM | POA: Diagnosis not present

## 2023-05-21 DIAGNOSIS — M948X7 Other specified disorders of cartilage, ankle and foot: Secondary | ICD-10-CM | POA: Diagnosis not present

## 2023-05-21 DIAGNOSIS — E1122 Type 2 diabetes mellitus with diabetic chronic kidney disease: Secondary | ICD-10-CM | POA: Diagnosis present

## 2023-05-21 DIAGNOSIS — I2510 Atherosclerotic heart disease of native coronary artery without angina pectoris: Secondary | ICD-10-CM | POA: Diagnosis not present

## 2023-05-21 DIAGNOSIS — E113411 Type 2 diabetes mellitus with severe nonproliferative diabetic retinopathy with macular edema, right eye: Secondary | ICD-10-CM | POA: Diagnosis not present

## 2023-05-21 DIAGNOSIS — R197 Diarrhea, unspecified: Secondary | ICD-10-CM | POA: Diagnosis not present

## 2023-05-21 DIAGNOSIS — B999 Unspecified infectious disease: Secondary | ICD-10-CM | POA: Diagnosis present

## 2023-05-21 DIAGNOSIS — I70202 Unspecified atherosclerosis of native arteries of extremities, left leg: Secondary | ICD-10-CM | POA: Diagnosis not present

## 2023-05-21 DIAGNOSIS — E1142 Type 2 diabetes mellitus with diabetic polyneuropathy: Secondary | ICD-10-CM | POA: Diagnosis present

## 2023-05-21 DIAGNOSIS — I38 Endocarditis, valve unspecified: Secondary | ICD-10-CM | POA: Diagnosis not present

## 2023-05-21 DIAGNOSIS — N250 Renal osteodystrophy: Secondary | ICD-10-CM | POA: Diagnosis not present

## 2023-05-21 DIAGNOSIS — K648 Other hemorrhoids: Secondary | ICD-10-CM

## 2023-05-21 DIAGNOSIS — R5381 Other malaise: Secondary | ICD-10-CM | POA: Diagnosis not present

## 2023-05-21 DIAGNOSIS — E113592 Type 2 diabetes mellitus with proliferative diabetic retinopathy without macular edema, left eye: Secondary | ICD-10-CM | POA: Diagnosis present

## 2023-05-21 DIAGNOSIS — I132 Hypertensive heart and chronic kidney disease with heart failure and with stage 5 chronic kidney disease, or end stage renal disease: Secondary | ICD-10-CM | POA: Diagnosis present

## 2023-05-21 DIAGNOSIS — E11621 Type 2 diabetes mellitus with foot ulcer: Secondary | ICD-10-CM | POA: Diagnosis present

## 2023-05-21 DIAGNOSIS — E43 Unspecified severe protein-calorie malnutrition: Secondary | ICD-10-CM | POA: Diagnosis not present

## 2023-05-21 DIAGNOSIS — N186 End stage renal disease: Secondary | ICD-10-CM | POA: Diagnosis not present

## 2023-05-21 DIAGNOSIS — R600 Localized edema: Secondary | ICD-10-CM | POA: Diagnosis not present

## 2023-05-21 DIAGNOSIS — Z794 Long term (current) use of insulin: Secondary | ICD-10-CM

## 2023-05-21 DIAGNOSIS — I10 Essential (primary) hypertension: Secondary | ICD-10-CM

## 2023-05-21 DIAGNOSIS — I3139 Other pericardial effusion (noninflammatory): Secondary | ICD-10-CM | POA: Diagnosis not present

## 2023-05-21 DIAGNOSIS — Z89512 Acquired absence of left leg below knee: Secondary | ICD-10-CM | POA: Diagnosis not present

## 2023-05-21 DIAGNOSIS — M2012 Hallux valgus (acquired), left foot: Secondary | ICD-10-CM | POA: Diagnosis not present

## 2023-05-21 DIAGNOSIS — I480 Paroxysmal atrial fibrillation: Secondary | ICD-10-CM | POA: Diagnosis not present

## 2023-05-21 DIAGNOSIS — I739 Peripheral vascular disease, unspecified: Secondary | ICD-10-CM

## 2023-05-21 DIAGNOSIS — M797 Fibromyalgia: Secondary | ICD-10-CM | POA: Diagnosis not present

## 2023-05-21 DIAGNOSIS — E1169 Type 2 diabetes mellitus with other specified complication: Secondary | ICD-10-CM | POA: Diagnosis not present

## 2023-05-21 DIAGNOSIS — M869 Osteomyelitis, unspecified: Secondary | ICD-10-CM | POA: Diagnosis not present

## 2023-05-21 DIAGNOSIS — E785 Hyperlipidemia, unspecified: Secondary | ICD-10-CM | POA: Diagnosis not present

## 2023-05-21 DIAGNOSIS — M86162 Other acute osteomyelitis, left tibia and fibula: Secondary | ICD-10-CM | POA: Diagnosis not present

## 2023-05-21 DIAGNOSIS — M810 Age-related osteoporosis without current pathological fracture: Secondary | ICD-10-CM | POA: Diagnosis not present

## 2023-05-21 DIAGNOSIS — L97423 Non-pressure chronic ulcer of left heel and midfoot with necrosis of muscle: Secondary | ICD-10-CM | POA: Diagnosis present

## 2023-05-21 DIAGNOSIS — D631 Anemia in chronic kidney disease: Secondary | ICD-10-CM | POA: Diagnosis not present

## 2023-05-21 DIAGNOSIS — E1152 Type 2 diabetes mellitus with diabetic peripheral angiopathy with gangrene: Secondary | ICD-10-CM | POA: Diagnosis present

## 2023-05-21 DIAGNOSIS — I120 Hypertensive chronic kidney disease with stage 5 chronic kidney disease or end stage renal disease: Secondary | ICD-10-CM | POA: Diagnosis not present

## 2023-05-21 DIAGNOSIS — S91302A Unspecified open wound, left foot, initial encounter: Secondary | ICD-10-CM | POA: Diagnosis not present

## 2023-05-21 DIAGNOSIS — Z4781 Encounter for orthopedic aftercare following surgical amputation: Secondary | ICD-10-CM | POA: Diagnosis not present

## 2023-05-21 DIAGNOSIS — E1129 Type 2 diabetes mellitus with other diabetic kidney complication: Secondary | ICD-10-CM | POA: Diagnosis not present

## 2023-05-21 DIAGNOSIS — M159 Polyosteoarthritis, unspecified: Secondary | ICD-10-CM | POA: Diagnosis not present

## 2023-05-21 DIAGNOSIS — R509 Fever, unspecified: Secondary | ICD-10-CM | POA: Diagnosis not present

## 2023-05-21 DIAGNOSIS — Z1152 Encounter for screening for COVID-19: Secondary | ICD-10-CM | POA: Diagnosis not present

## 2023-05-21 DIAGNOSIS — R918 Other nonspecific abnormal finding of lung field: Secondary | ICD-10-CM | POA: Insufficient documentation

## 2023-05-21 DIAGNOSIS — Z87891 Personal history of nicotine dependence: Secondary | ICD-10-CM | POA: Diagnosis not present

## 2023-05-21 DIAGNOSIS — I517 Cardiomegaly: Secondary | ICD-10-CM | POA: Diagnosis not present

## 2023-05-21 DIAGNOSIS — M25551 Pain in right hip: Secondary | ICD-10-CM | POA: Diagnosis not present

## 2023-05-21 DIAGNOSIS — K8020 Calculus of gallbladder without cholecystitis without obstruction: Secondary | ICD-10-CM | POA: Diagnosis not present

## 2023-05-21 DIAGNOSIS — N200 Calculus of kidney: Secondary | ICD-10-CM | POA: Diagnosis not present

## 2023-05-21 DIAGNOSIS — E113291 Type 2 diabetes mellitus with mild nonproliferative diabetic retinopathy without macular edema, right eye: Secondary | ICD-10-CM | POA: Diagnosis present

## 2023-05-21 DIAGNOSIS — R2689 Other abnormalities of gait and mobility: Secondary | ICD-10-CM | POA: Diagnosis not present

## 2023-05-21 DIAGNOSIS — M6281 Muscle weakness (generalized): Secondary | ICD-10-CM | POA: Diagnosis not present

## 2023-05-21 DIAGNOSIS — D849 Immunodeficiency, unspecified: Secondary | ICD-10-CM | POA: Diagnosis present

## 2023-05-21 DIAGNOSIS — M86172 Other acute osteomyelitis, left ankle and foot: Secondary | ICD-10-CM | POA: Diagnosis not present

## 2023-05-21 DIAGNOSIS — Z7401 Bed confinement status: Secondary | ICD-10-CM | POA: Diagnosis not present

## 2023-05-21 DIAGNOSIS — R109 Unspecified abdominal pain: Secondary | ICD-10-CM | POA: Diagnosis not present

## 2023-05-21 DIAGNOSIS — J90 Pleural effusion, not elsewhere classified: Secondary | ICD-10-CM | POA: Diagnosis not present

## 2023-05-21 DIAGNOSIS — R5383 Other fatigue: Secondary | ICD-10-CM | POA: Diagnosis not present

## 2023-05-21 DIAGNOSIS — Z992 Dependence on renal dialysis: Secondary | ICD-10-CM | POA: Diagnosis not present

## 2023-05-21 DIAGNOSIS — E113512 Type 2 diabetes mellitus with proliferative diabetic retinopathy with macular edema, left eye: Secondary | ICD-10-CM | POA: Diagnosis not present

## 2023-05-21 DIAGNOSIS — N2581 Secondary hyperparathyroidism of renal origin: Secondary | ICD-10-CM | POA: Diagnosis present

## 2023-05-21 DIAGNOSIS — L97429 Non-pressure chronic ulcer of left heel and midfoot with unspecified severity: Secondary | ICD-10-CM | POA: Diagnosis not present

## 2023-05-21 DIAGNOSIS — I4819 Other persistent atrial fibrillation: Secondary | ICD-10-CM | POA: Diagnosis present

## 2023-05-21 DIAGNOSIS — M791 Myalgia, unspecified site: Secondary | ICD-10-CM | POA: Diagnosis not present

## 2023-05-21 DIAGNOSIS — E871 Hypo-osmolality and hyponatremia: Secondary | ICD-10-CM | POA: Diagnosis not present

## 2023-05-21 DIAGNOSIS — M2042 Other hammer toe(s) (acquired), left foot: Secondary | ICD-10-CM | POA: Diagnosis not present

## 2023-05-21 LAB — IRON AND TIBC: Iron: 24 ug/dL — ABNORMAL LOW (ref 45–182)

## 2023-05-21 LAB — RETICULOCYTES
Immature Retic Fract: 4.6 % (ref 2.3–15.9)
RBC.: 3.11 MIL/uL — ABNORMAL LOW (ref 4.22–5.81)
Retic Count, Absolute: 14.9 10*3/uL — ABNORMAL LOW (ref 19.0–186.0)
Retic Ct Pct: 0.5 % (ref 0.4–3.1)

## 2023-05-21 LAB — GASTROINTESTINAL PANEL BY PCR, STOOL (REPLACES STOOL CULTURE)

## 2023-05-21 LAB — FOLATE: Folate: 17 ng/mL (ref >5.9)

## 2023-05-21 LAB — COMPREHENSIVE METABOLIC PANEL
ALT: 10 U/L (ref 0–44)
AST: 19 U/L (ref 15–41)
Albumin: 2.5 g/dL — ABNORMAL LOW (ref 3.5–5.0)
Alkaline Phosphatase: 61 U/L (ref 38–126)
Anion gap: 15 (ref 5–15)
BUN: 30 mg/dL — ABNORMAL HIGH (ref 8–23)
CO2: 26 mmol/L (ref 22–32)
Calcium: 8 mg/dL — ABNORMAL LOW (ref 8.9–10.3)
Chloride: 94 mmol/L — ABNORMAL LOW (ref 98–111)
Creatinine, Ser: 5.27 mg/dL — ABNORMAL HIGH (ref 0.61–1.24)
GFR, Estimated: 11 mL/min — ABNORMAL LOW (ref >60)
Glucose, Bld: 137 mg/dL — ABNORMAL HIGH (ref 70–99)
Potassium: 3.1 mmol/L — ABNORMAL LOW (ref 3.5–5.1)
Sodium: 135 mmol/L (ref 135–145)
Total Bilirubin: 0.5 mg/dL (ref 0.3–1.2)
Total Protein: 6.4 g/dL — ABNORMAL LOW (ref 6.5–8.1)

## 2023-05-21 LAB — CBC
HCT: 29.5 % — ABNORMAL LOW (ref 39.0–52.0)
Hemoglobin: 9.7 g/dL — ABNORMAL LOW (ref 13.0–17.0)
MCH: 30.8 pg (ref 26.0–34.0)
MCHC: 32.9 g/dL (ref 30.0–36.0)
MCV: 93.7 fL (ref 80.0–100.0)
Platelets: 167 10*3/uL (ref 150–400)
RBC: 3.15 MIL/uL — ABNORMAL LOW (ref 4.22–5.81)
RDW: 14.6 % (ref 11.5–15.5)
WBC: 13.7 10*3/uL — ABNORMAL HIGH (ref 4.0–10.5)
nRBC: 0 % (ref 0.0–0.2)

## 2023-05-21 LAB — GLUCOSE, CAPILLARY
Glucose-Capillary: 105 mg/dL — ABNORMAL HIGH (ref 70–99)
Glucose-Capillary: 106 mg/dL — ABNORMAL HIGH (ref 70–99)
Glucose-Capillary: 118 mg/dL — ABNORMAL HIGH (ref 70–99)
Glucose-Capillary: 124 mg/dL — ABNORMAL HIGH (ref 70–99)
Glucose-Capillary: 138 mg/dL — ABNORMAL HIGH (ref 70–99)
Glucose-Capillary: 161 mg/dL — ABNORMAL HIGH (ref 70–99)

## 2023-05-21 LAB — C DIFFICILE QUICK SCREEN W PCR REFLEX
C Diff antigen: NEGATIVE
C Diff interpretation: NOT DETECTED
C Diff toxin: NEGATIVE

## 2023-05-21 LAB — FERRITIN: Ferritin: 2383 ng/mL — ABNORMAL HIGH (ref 24–336)

## 2023-05-21 LAB — VITAMIN B12: Vitamin B-12: 2739 pg/mL — ABNORMAL HIGH (ref 180–914)

## 2023-05-21 LAB — HEMOGLOBIN AND HEMATOCRIT, BLOOD
HCT: 29.5 % — ABNORMAL LOW (ref 39.0–52.0)
Hemoglobin: 9.5 g/dL — ABNORMAL LOW (ref 13.0–17.0)

## 2023-05-21 LAB — PHOSPHORUS: Phosphorus: 2.6 mg/dL (ref 2.5–4.6)

## 2023-05-21 LAB — CULTURE, BLOOD (ROUTINE X 2)

## 2023-05-21 LAB — VITAMIN D 25 HYDROXY (VIT D DEFICIENCY, FRACTURES): Vit D, 25-Hydroxy: 52.57 ng/mL (ref 30–100)

## 2023-05-21 LAB — HEPATITIS C ANTIBODY: HCV Ab: NONREACTIVE

## 2023-05-21 LAB — HIV ANTIBODY (ROUTINE TESTING W REFLEX): HIV Screen 4th Generation wRfx: NONREACTIVE

## 2023-05-21 LAB — HEPATITIS B SURFACE ANTIGEN: Hepatitis B Surface Ag: NONREACTIVE

## 2023-05-21 LAB — HEPATITIS B CORE ANTIBODY, IGM: Hep B C IgM: NONREACTIVE

## 2023-05-21 LAB — MAGNESIUM: Magnesium: 2 mg/dL (ref 1.7–2.4)

## 2023-05-21 LAB — MRSA NEXT GEN BY PCR, NASAL: MRSA by PCR Next Gen: NOT DETECTED

## 2023-05-21 LAB — LACTIC ACID, PLASMA: Lactic Acid, Venous: 1.1 mmol/L (ref 0.5–1.9)

## 2023-05-21 MED ORDER — SODIUM CHLORIDE 0.9% FLUSH
3.0000 mL | Freq: Two times a day (BID) | INTRAVENOUS | Status: DC
  Administered 2023-05-21 – 2023-06-10 (×35): 3 mL via INTRAVENOUS

## 2023-05-21 MED ORDER — POTASSIUM CHLORIDE CRYS ER 10 MEQ PO TBCR
40.0000 meq | EXTENDED_RELEASE_TABLET | Freq: Once | ORAL | Status: AC
  Administered 2023-05-21: 40 meq via ORAL
  Filled 2023-05-21: qty 4

## 2023-05-21 MED ORDER — SEVELAMER CARBONATE 800 MG PO TABS
800.0000 mg | ORAL_TABLET | Freq: Three times a day (TID) | ORAL | Status: DC
  Administered 2023-05-21 – 2023-05-26 (×9): 800 mg via ORAL
  Filled 2023-05-21 (×9): qty 1

## 2023-05-21 MED ORDER — ATORVASTATIN CALCIUM 80 MG PO TABS
80.0000 mg | ORAL_TABLET | Freq: Every day | ORAL | Status: DC
  Administered 2023-05-21 – 2023-06-11 (×17): 80 mg via ORAL
  Filled 2023-05-21 (×19): qty 1

## 2023-05-21 MED ORDER — AMLODIPINE BESYLATE 5 MG PO TABS: 5.0000 mg | ORAL_TABLET | Freq: Every day | ORAL | Status: DC

## 2023-05-21 MED ORDER — APIXABAN 5 MG PO TABS
5.0000 mg | ORAL_TABLET | Freq: Two times a day (BID) | ORAL | Status: DC
  Administered 2023-05-21 – 2023-06-05 (×28): 5 mg via ORAL
  Filled 2023-05-21 (×33): qty 1

## 2023-05-21 MED ORDER — POTASSIUM CHLORIDE 20 MEQ PO PACK
20.0000 meq | PACK | Freq: Once | ORAL | Status: AC
  Administered 2023-05-21: 20 meq via ORAL
  Filled 2023-05-21: qty 1

## 2023-05-21 MED ORDER — CHLORHEXIDINE GLUCONATE CLOTH 2 % EX PADS
6.0000 | MEDICATED_PAD | Freq: Every day | CUTANEOUS | Status: DC
  Administered 2023-05-24: 6 via TOPICAL

## 2023-05-21 MED ORDER — PANTOPRAZOLE SODIUM 40 MG PO TBEC
40.0000 mg | DELAYED_RELEASE_TABLET | Freq: Every day | ORAL | Status: DC
  Administered 2023-05-21 – 2023-06-11 (×18): 40 mg via ORAL
  Filled 2023-05-21 (×20): qty 1

## 2023-05-21 MED ORDER — FUROSEMIDE 40 MG PO TABS
80.0000 mg | ORAL_TABLET | Freq: Two times a day (BID) | ORAL | Status: DC
  Administered 2023-05-21 – 2023-05-26 (×9): 80 mg via ORAL
  Filled 2023-05-21 (×9): qty 2

## 2023-05-21 MED ORDER — GABAPENTIN 300 MG PO CAPS
300.0000 mg | ORAL_CAPSULE | Freq: Two times a day (BID) | ORAL | Status: DC
  Administered 2023-05-21 – 2023-06-11 (×38): 300 mg via ORAL
  Filled 2023-05-21 (×40): qty 1

## 2023-05-21 MED ORDER — CLOPIDOGREL BISULFATE 75 MG PO TABS
75.0000 mg | ORAL_TABLET | Freq: Every day | ORAL | Status: DC
  Administered 2023-05-21 – 2023-05-24 (×4): 75 mg via ORAL
  Filled 2023-05-21 (×5): qty 1

## 2023-05-21 MED ORDER — DOXERCALCIFEROL 4 MCG/2ML IV SOLN
5.0000 ug | INTRAVENOUS | Status: DC
  Administered 2023-05-24 – 2023-05-31 (×4): 5 ug via INTRAVENOUS
  Filled 2023-05-21 (×8): qty 4

## 2023-05-21 MED ORDER — ACETAMINOPHEN 325 MG PO TABS
650.0000 mg | ORAL_TABLET | Freq: Four times a day (QID) | ORAL | Status: DC | PRN
  Administered 2023-05-21 – 2023-06-06 (×20): 650 mg via ORAL
  Filled 2023-05-21 (×24): qty 2

## 2023-05-21 MED ORDER — ISOSORBIDE MONONITRATE ER 60 MG PO TB24
60.0000 mg | ORAL_TABLET | Freq: Every day | ORAL | Status: DC
  Administered 2023-05-21 – 2023-06-11 (×15): 60 mg via ORAL
  Filled 2023-05-21 (×17): qty 1

## 2023-05-21 MED ORDER — SODIUM CHLORIDE 0.9 % IV SOLN
2.0000 g | INTRAVENOUS | Status: DC
  Administered 2023-05-21 – 2023-05-26 (×6): 2 g via INTRAVENOUS
  Filled 2023-05-21 (×6): qty 20

## 2023-05-21 MED ORDER — SODIUM CHLORIDE 0.9 % IV SOLN: 250.0000 mL | INTRAVENOUS | Status: DC | PRN

## 2023-05-21 MED ORDER — TAMSULOSIN HCL 0.4 MG PO CAPS
0.4000 mg | ORAL_CAPSULE | Freq: Every day | ORAL | Status: DC
  Administered 2023-05-21 – 2023-06-10 (×21): 0.4 mg via ORAL
  Filled 2023-05-21 (×23): qty 1

## 2023-05-21 MED ORDER — HYDRALAZINE HCL 20 MG/ML IJ SOLN
10.0000 mg | Freq: Three times a day (TID) | INTRAMUSCULAR | Status: DC | PRN
  Administered 2023-05-21 – 2023-05-30 (×3): 10 mg via INTRAVENOUS
  Filled 2023-05-21 (×3): qty 1

## 2023-05-21 MED ORDER — SODIUM CHLORIDE 0.9% FLUSH: 3.0000 mL | INTRAVENOUS | Status: DC | PRN

## 2023-05-21 MED ORDER — HEPARIN SODIUM (PORCINE) 5000 UNIT/ML IJ SOLN: 5000.0000 [IU] | Freq: Three times a day (TID) | INTRAMUSCULAR | Status: DC

## 2023-05-21 MED ORDER — FINASTERIDE 5 MG PO TABS
5.0000 mg | ORAL_TABLET | Freq: Every day | ORAL | Status: DC
  Administered 2023-05-21 – 2023-06-11 (×19): 5 mg via ORAL
  Filled 2023-05-21 (×21): qty 1

## 2023-05-21 MED ORDER — CARVEDILOL 25 MG PO TABS
25.0000 mg | ORAL_TABLET | Freq: Two times a day (BID) | ORAL | Status: DC
  Administered 2023-05-21 – 2023-06-11 (×36): 25 mg via ORAL
  Filled 2023-05-21 (×28): qty 1
  Filled 2023-05-21: qty 2
  Filled 2023-05-21 (×3): qty 1
  Filled 2023-05-21: qty 2
  Filled 2023-05-21 (×6): qty 1

## 2023-05-21 MED ORDER — INSULIN ASPART 100 UNIT/ML IJ SOLN
0.0000 [IU] | Freq: Three times a day (TID) | INTRAMUSCULAR | Status: DC
  Administered 2023-05-21 – 2023-05-26 (×5): 1 [IU] via SUBCUTANEOUS
  Administered 2023-05-28: 2 [IU] via SUBCUTANEOUS
  Administered 2023-06-03: 1 [IU] via SUBCUTANEOUS
  Administered 2023-06-04: 2 [IU] via SUBCUTANEOUS
  Administered 2023-06-06 – 2023-06-07 (×3): 1 [IU] via SUBCUTANEOUS
  Administered 2023-06-08: 2 [IU] via SUBCUTANEOUS
  Administered 2023-06-08: 3 [IU] via SUBCUTANEOUS
  Administered 2023-06-09: 2 [IU] via SUBCUTANEOUS

## 2023-05-21 MED ORDER — VANCOMYCIN HCL 1750 MG/350ML IV SOLN
1750.0000 mg | Freq: Once | INTRAVENOUS | Status: AC
  Administered 2023-05-21: 1750 mg via INTRAVENOUS
  Filled 2023-05-21: qty 350

## 2023-05-21 NOTE — Progress Notes (Signed)
New Admission Note:   Arrival Method: arrived from [facility] ED via stretcher Mental Orientation: Alert and oriented x4 Telemetry: Box #1 Assessment: Completed Skin: See doc flowsheet IV: NSL-Rt AFA,Rt PFA Pain: 8/10 Tubes: N/A Safety Measures: Safety Fall Prevention Plan has been discussed.  Admission: Completed Orientation: Patient has been oriented to the room, unit and staff.  Family: None at bedside  Orders have been reviewed and implemented. Will continue to monitor the patient. Call light has been placed within reach and bed alarm has been activated.   Matheo Rathbone [employer], RN-BC Phone number: [phone]

## 2023-05-21 NOTE — H&P (Addendum)
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 intensity of service, high risk for further deterioration and high frequency of surveillance required.Marland Kitchen    Tereasa Coop, MD [facility]  How to contact the [facility] Attending or Consulting provider 7A - 7P or covering provider during after hours 7P -7A, for this patient.  Check the care team in [facility] and look for a) attending/consulting [facility] provider listed and b) the [facility] team listed Log into [email or web address] and use [facility] universal password to access. If you do not have the password, please contact the hospital operator. Locate the [facility] provider you are looking for  under [facility] and page to a number that you can be directly reached. If you still have difficulty reaching the provider, please page the [facility] (Director on Call) for the Hospitalists listed on amion for assistance.  05/21/2023, 2:41 AM

## 2023-05-21 NOTE — Progress Notes (Signed)
ED Pharmacy Antibiotic Sign Off An antibiotic consult was received from an ED provider for vancomycin per pharmacy dosing for fever of unknown source. A chart review was completed to assess appropriateness. Patient is ESRD HD MWF  The following one time order(s) were placed:   Vancomycin 1750 mg IV x1  Further antibiotic and/or antibiotic pharmacy consults should be ordered by the admitting provider if indicated.   Thank you for allowing pharmacy to be a part of this patient's care.   Arabella Merles, [facility]  Clinical Pharmacist 05/21/23 1:38 AM
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
## 2023-05-21 NOTE — Plan of Care (Signed)

## 2023-05-21 NOTE — Plan of Care (Signed)
  Problem: Coping: Goal: Ability to adjust to condition or change in health will improve Outcome: Progressing   Problem: Fluid Volume: Goal: Ability to maintain a balanced intake and output will improve Outcome: Progressing   Problem: Health Behavior/Discharge Planning: Goal: Ability to identify and utilize available resources and services will improve Outcome: Progressing   
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
## 2023-05-21 NOTE — Progress Notes (Signed)
I have seen and assesed patient and agree with Dr.Sundil's assessment and plan. Patient 72 year old gentleman history of ESRD on HD Monday Wednesday Friday, diabetes mellitus with neuropathy and retinopathy, GERD, hypertension, hyperlipidemia, CAD, A-fib, CHF presenting to the ED with fevers and chills, 2 episodes of loose watery stools and fevers x 1 day.  Patient seen in the ED noted to have a temp of 103, leukocytosis.  Chest x-ray done negative.  Patient pancultured.  Stool studies sent with C. difficile PCR negative.  COVID PCR negative.  Patient admitted for further evaluation and management.  Patient placed empirically on IV antibiotics.  Nephrology consulted.  No charge.

## 2023-05-22 DIAGNOSIS — B999 Unspecified infectious disease: Secondary | ICD-10-CM | POA: Diagnosis not present

## 2023-05-22 LAB — RENAL FUNCTION PANEL
Albumin: 2.3 g/dL — ABNORMAL LOW (ref 3.5–5.0)
Anion gap: 19 — ABNORMAL HIGH (ref 5–15)
BUN: 43 mg/dL — ABNORMAL HIGH (ref 8–23)
CO2: 23 mmol/L (ref 22–32)
Calcium: 8.5 mg/dL — ABNORMAL LOW (ref 8.9–10.3)
Chloride: 91 mmol/L — ABNORMAL LOW (ref 98–111)
Creatinine, Ser: 6.8 mg/dL — ABNORMAL HIGH (ref 0.61–1.24)
GFR, Estimated: 8 mL/min — ABNORMAL LOW (ref >60)
Glucose, Bld: 123 mg/dL — ABNORMAL HIGH (ref 70–99)
Phosphorus: 3.2 mg/dL (ref 2.5–4.6)
Potassium: 3.4 mmol/L — ABNORMAL LOW (ref 3.5–5.1)
Sodium: 133 mmol/L — ABNORMAL LOW (ref 135–145)

## 2023-05-22 LAB — CBC WITH DIFFERENTIAL/PLATELET
Abs Immature Granulocytes: 0 10*3/uL (ref 0.00–0.07)
Basophils Absolute: 0 10*3/uL (ref 0.0–0.1)
Basophils Relative: 0 %
Eosinophils Absolute: 0 10*3/uL (ref 0.0–0.5)
Eosinophils Relative: 0 %
HCT: 29.8 % — ABNORMAL LOW (ref 39.0–52.0)
Hemoglobin: 9.5 g/dL — ABNORMAL LOW (ref 13.0–17.0)
Lymphocytes Relative: 4 %
Lymphs Abs: 0.6 10*3/uL — ABNORMAL LOW (ref 0.7–4.0)
MCH: 30.1 pg (ref 26.0–34.0)
MCHC: 31.9 g/dL (ref 30.0–36.0)
MCV: 94.3 fL (ref 80.0–100.0)
Monocytes Absolute: 0.6 10*3/uL (ref 0.1–1.0)
Monocytes Relative: 4 %
Neutro Abs: 13.2 10*3/uL — ABNORMAL HIGH (ref 1.7–7.7)
Neutrophils Relative %: 92 %
Platelets: 196 10*3/uL (ref 150–400)
RBC: 3.16 MIL/uL — ABNORMAL LOW (ref 4.22–5.81)
RDW: 14.8 % (ref 11.5–15.5)
WBC: 14.4 10*3/uL — ABNORMAL HIGH (ref 4.0–10.5)
nRBC: 0 % (ref 0.0–0.2)
nRBC: 0 /100 WBC

## 2023-05-22 LAB — GLUCOSE, CAPILLARY
Glucose-Capillary: 99 mg/dL (ref 70–99)
Glucose-Capillary: 109 mg/dL — ABNORMAL HIGH (ref 70–99)
Glucose-Capillary: 136 mg/dL — ABNORMAL HIGH (ref 70–99)
Glucose-Capillary: 136 mg/dL — ABNORMAL HIGH (ref 70–99)

## 2023-05-22 LAB — PTH, INTACT AND CALCIUM
Calcium, Total (PTH): 8 mg/dL — ABNORMAL LOW (ref 8.6–10.2)
PTH: 48 pg/mL (ref 15–65)

## 2023-05-22 LAB — CULTURE, BLOOD (ROUTINE X 2)
Culture: NO GROWTH
Special Requests: ADEQUATE

## 2023-05-22 LAB — HEPATITIS B SURFACE ANTIBODY, QUANTITATIVE: Hep B S AB Quant (Post): 1768 m[IU]/mL

## 2023-05-22 MED ORDER — PENTAFLUOROPROP-TETRAFLUOROETH EX AERO
INHALATION_SPRAY | CUTANEOUS | Status: AC
  Filled 2023-05-22: qty 30

## 2023-05-22 MED ORDER — HEPARIN SODIUM (PORCINE) 1000 UNIT/ML IJ SOLN
INTRAMUSCULAR | Status: AC
  Filled 2023-05-22: qty 3

## 2023-05-22 MED ORDER — NEPRO/CARBSTEADY PO LIQD
237.0000 mL | Freq: Two times a day (BID) | ORAL | Status: DC
  Administered 2023-05-22 – 2023-06-10 (×11): 237 mL via ORAL
  Filled 2023-05-22 (×2): qty 237

## 2023-05-22 NOTE — Progress Notes (Signed)
PROGRESS NOTE    William Webb [facility]  ZOX:[id number] DOB: 17-Apr-1951 DOA: 05/20/2023 PCP: Irven Coe, MD    Brief Narrative:  William Webb is a 72 y.o. male with medical history significant of ESRD on HD MWF, PAF on Eliquis, HTN, HLD, CAD, PAD, insulin-dependent DM-2 and internal hemorrhoids presented to emergency department as he developed some chills and 1 episode of loose stool at home.  Patient reported he noticed some chills since last 24 hours and he has 1 episode of loose stool on Sunday night.    Assessment and Plan: Infection of unknown origin Elevated temperature Leukocytosis Loose stool-1 episode -Patient coming with complaining of chills, 1 episode of loose stool on Sunday and at presentation to ED found temperature spike to 103.  WBC slightly elevated 13.1. Denies any malaise, fatigue, rash, ear/discharge, sinus pain/congestion, photophobia, eye pain/discharge/redness, chest pain, palpitation, orthopnea, leg swelling, cough, sputum production, shortness of breath, wheezing, vomiting, abdominal pain, constipation, increased urinary urgency/dysuria, back pain, neck pain, joint pain, headache, dizziness, change of appetite and weight.  Physical exam unremarkable to identify any source of infection.  Left-sided AV fistula site looks healthy without any sign of infection. -Negative COVID. - Chest x-ray unremarkable no acute disease process - Patient reported loose stool and no recent antibiotic use.  CT abdomen obtained did not showed any acute intra-abdominal process and cholelithiasis. -Given patient still makes urine checking UA and urine culture.   - blood culture x 2. -Given patient is immunocompromised in the setting of  ESRD planning to continue empiric antibiotic coverage with ceftriaxone 2 g daily.  Will follow-up with blood and urine culture result for appropriate antibiotic guidance -Continue to monitor CBC -Check fever curve.   Loose stool-1 episode - No  recent antibiotic use. - C. difficile screen with PCR and GI panel -negative   Asymptomatic cholelithiasis - CT abdomen and pelvis showed incidental finding of cholelithiasis.  Patient denies any abdominal pain and right upper quadrant pain. -Stable hepatic panel. - Continue to monitor   Hypokalemia - repleted in HD   Incidental finding of lung nodule - CT abdomen pelvis showed incidental initial finding of solid nodules right lower and right middle lobe.  Recommended noncontrast chest CT in 3 to 6 months for follow-up. - Patient needs outpatient follow-up with PCP for repeat chest CT in 3-6 months   ESRD on HD MWF  - Patient is euvolemic on physical exam - Compliant with dialysis.  Last dialysis 7/24 -renal consulted   Essential hypertension Repeated blood pressure - Blood pressure 145/61 on presentation.  And heart rate 82. -Per chart review from previous hospital discharge on May 2024 patient supposed to be on amlodipine 5 mg daily, Coreg 25 mg twice daily, Lasix 40 mg daily, losartan 100 mg daily hydralazine 100 mg 3 times daily and Imdur 25 mg daily.  Patient reported he ran out of few blood pressure regimen and not sure what he is taking currently at home. -Blood pressure borderline elevated around 130 to 140. Resuming Coreg 25 mg twice daily.  Holding other blood pressure regimen and will restart as patient tolerates.     Paroxysmal atrial fibrillation -EKG showed normal sinus rhythm and 3 PVC.  Patient reported not taking Coreg at home.  Patient reported running up to a few medications but he is continuing taking Eliquis. --Patient reported noticing streak of in the stool.  He has history of chronic internal hemorrhoid.  Hemoglobin stable around 10.  It is safe to resume  Eliquis as of now. - Continue Eliquis 5 mg twice daily and resuming Coreg 25 mg twice daily -Continue to monitor hemoglobin and development of any acute GI blood loss   History of CAD History of  PAD Hyperlipidemia. - Resuming Plavix 75 mg daily, statin and Coreg 25 mg twice daily   Internal hemorrhoid - Patient reported noticing some staker blood on the stool which is on and off.  Hemoglobin 10.5 around his baseline. - Continue to monitor for development of any overt GI bleed, monitor hemoglobin closely as patient is on Eliquis and Plavix.   Insulin dependent DM type II-well-controlled Per chart review most recent A1c 6 in May 2024 - SSI    Peripheral neuropathy - home gabapentin 300 mg twice daily   BPH - Flomax and Proscar.   GERD - Protonix 40 mg daily   DVT prophylaxis: SCDs Start: 05/21/23 0155 apixaban (ELIQUIS) tablet 5 mg    Code Status: Full Code Family Communication:   Disposition Plan:  Level of care: Telemetry Medical Status is: Inpatient Remains inpatient appropriate     Consultants:  renal    Subjective: In HD, no complaints  Objective: Vitals:   05/22/23 1100 05/22/23 1130 05/22/23 1143 05/22/23 1311  BP: (!) 175/61 (!) 173/67 (!) 177/67 (!) 168/67  Pulse: 75 76 76 77  Resp: (!) 22 20 15 18   Temp:   99.2 F (37.3 C) 99.9 F (37.7 C)  TempSrc:   Oral Oral  SpO2: 95% 94% 97% 96%  Weight:   78.1 kg   Height:        Intake/Output Summary (Last 24 hours) at 05/22/2023 1332 Last data filed at 05/22/2023 1143 Gross per 24 hour  Intake 440.69 ml  Output 2000 ml  Net -1559.31 ml   Filed Weights   05/21/23 0500 05/22/23 0749 05/22/23 1143  Weight: 83.2 kg 79.5 kg 78.1 kg    Examination:   General: Appearance:    Well developed, well nourished male in no acute distress     Lungs:     Clear to auscultation bilaterally, respirations unlabored  Heart:    Normal heart rate.       Neurologic:   Awake, alert, oriented x 3. No apparent focal neurological           defect.        Data Reviewed: I have personally reviewed following labs and imaging studies  CBC: Recent Labs  Lab 05/20/23 2224 05/21/23 0804 05/21/23 1734  05/22/23 0810  WBC 13.8* 13.7*  --  14.4*  NEUTROABS 12.0*  --   --  13.2*  HGB 10.5* 9.7* 9.5* 9.5*  HCT 32.8* 29.5* 29.5* 29.8*  MCV 95.6 93.7  --  94.3  PLT 162 167  --  196   Basic Metabolic Panel: Recent Labs  Lab 05/20/23 2224 05/21/23 0804 05/21/23 1734 05/22/23 0809  NA 139 135  --  133*  K 2.9* 3.1*  --  3.4*  CL 91* 94*  --  91*  CO2 31 26  --  23  GLUCOSE 124* 137*  --  123*  BUN 23 30*  --  43*  CREATININE 4.75* 5.27*  --  6.80*  CALCIUM 8.4* 8.0*  --  8.5*  MG  --  2.0  --   --   PHOS  --   --  2.6 3.2   GFR: Estimated Creatinine Clearance: 10.7 mL/min (A) (by C-G formula based on SCr of 6.8 mg/dL (H)). Liver Function Tests:  Recent Labs  Lab 05/20/23 2224 05/21/23 0804 05/22/23 0809  AST 20 19  --   ALT 10 10  --   ALKPHOS 64 61  --   BILITOT 0.8 0.5  --   PROT 6.9 6.4*  --   ALBUMIN 2.7* 2.5* 2.3*   No results for input(s): "LIPASE", "AMYLASE" in the last 168 hours. No results for input(s): "AMMONIA" in the last 168 hours. Coagulation Profile: Recent Labs  Lab 05/20/23 2224  INR 1.4*   Cardiac Enzymes: No results for input(s): "CKTOTAL", "CKMB", "CKMBINDEX", "TROPONINI" in the last 168 hours. BNP (last 3 results) No results for input(s): "PROBNP" in the last 8760 hours. HbA1C: No results for input(s): "HGBA1C" in the last 72 hours. CBG: Recent Labs  Lab 05/21/23 1642 05/21/23 2037 05/21/23 2346 05/22/23 0649 05/22/23 1311  GLUCAP 118* 106* 105* 99 109*   Lipid Profile: No results for input(s): "CHOL", "HDL", "LDLCALC", "TRIG", "CHOLHDL", "LDLDIRECT" in the last 72 hours. Thyroid Function Tests: No results for input(s): "TSH", "T4TOTAL", "FREET4", "T3FREE", "THYROIDAB" in the last 72 hours. Anemia Panel: Recent Labs    05/21/23 0804  VITAMINB12 2,739*  FOLATE 17.0  FERRITIN 2,383*  TIBC NOT CALCULATED  IRON 24*  RETICCTPCT 0.5   Sepsis Labs: Recent Labs  Lab 05/20/23 2230 05/21/23 0804  LATICACIDVEN 1.1 1.1     Recent Results (from the past 240 hour(s))  Culture, blood (Routine x 2)     Status: None (Preliminary result)   Collection Time: 05/20/23  9:49 PM   Specimen: BLOOD  Result Value Ref Range Status   Specimen Description BLOOD SITE NOT SPECIFIED  Final   Special Requests   Final    BOTTLES DRAWN AEROBIC AND ANAEROBIC Blood Culture results may not be optimal due to an inadequate volume of blood received in culture bottles   Culture   Final    NO GROWTH 2 DAYS Performed at [facility], [address], [place], [place] [address]    Report Status PENDING  Incomplete  SARS Coronavirus 2 by RT PCR (hospital order, performed in [facility] hospital lab) *cepheid single result test* Anterior Nasal Swab     Status: None   Collection Time: 05/20/23 10:27 PM   Specimen: Anterior Nasal Swab  Result Value Ref Range Status   SARS Coronavirus 2 by RT PCR NEGATIVE NEGATIVE Final    Comment: Performed at [facility], [address], [place], [place] [address]  Culture, blood (Routine x 2)     Status: None (Preliminary result)   Collection Time: 05/20/23 10:29 PM   Specimen: BLOOD  Result Value Ref Range Status   Specimen Description BLOOD SITE NOT SPECIFIED  Final   Special Requests   Final    BOTTLES DRAWN AEROBIC AND ANAEROBIC Blood Culture adequate volume   Culture   Final    NO GROWTH 2 DAYS Performed at [facility], [address], [place], [place] [address]    Report Status PENDING  Incomplete  Gastrointestinal Panel by PCR , Stool     Status: None   Collection Time: 05/21/23  3:52 AM   Specimen: Stool  Result Value Ref Range Status   Campylobacter species NOT DETECTED NOT DETECTED Final   Plesimonas shigelloides NOT DETECTED NOT DETECTED Final   Salmonella species NOT DETECTED NOT DETECTED Final   Yersinia enterocolitica NOT DETECTED NOT DETECTED Final   Vibrio species NOT DETECTED NOT DETECTED Final   Vibrio cholerae NOT DETECTED NOT  DETECTED Final    Enteroaggregative E coli (EAEC) NOT DETECTED NOT DETECTED Final   Enteropathogenic E coli (EPEC) NOT DETECTED NOT DETECTED Final   Enterotoxigenic E coli (ETEC) NOT DETECTED NOT DETECTED Final   Shiga like toxin producing E coli (STEC) NOT DETECTED NOT DETECTED Final   Shigella/Enteroinvasive E coli (EIEC) NOT DETECTED NOT DETECTED Final   Cryptosporidium NOT DETECTED NOT DETECTED Final   Cyclospora cayetanensis NOT DETECTED NOT DETECTED Final   Entamoeba histolytica NOT DETECTED NOT DETECTED Final   Giardia lamblia NOT DETECTED NOT DETECTED Final   Adenovirus F40/41 NOT DETECTED NOT DETECTED Final   Astrovirus NOT DETECTED NOT DETECTED Final   Norovirus GI/GII NOT DETECTED NOT DETECTED Final   Rotavirus A NOT DETECTED NOT DETECTED Final   Sapovirus (I, II, IV, and V) NOT DETECTED NOT DETECTED Final    Comment: Performed at [facility], [address] [place], [place] [address]  C Difficile Quick Screen w PCR reflex     Status: None   Collection Time: 05/21/23  3:52 AM   Specimen: Stool  Result Value Ref Range Status   C Diff antigen NEGATIVE NEGATIVE Final   C Diff toxin NEGATIVE NEGATIVE Final   C Diff interpretation No C. difficile detected.  Final    Comment: Performed at [facility], [address] [place], [place] [address]  MRSA Next Gen by PCR, Nasal     Status: None   Collection Time: 05/21/23  3:54 AM   Specimen: Nasal Mucosa; Nasal Swab  Result Value Ref Range Status   MRSA by PCR Next Gen NOT DETECTED NOT DETECTED Final    Comment: (NOTE) The GeneXpert MRSA Assay (FDA approved for NASAL specimens only), is one component of a comprehensive MRSA colonization surveillance program. It is not intended to diagnose MRSA infection nor to guide or monitor treatment for MRSA infections. Test performance is not FDA approved in patients less than 17 years old. Performed at [facility], [address], [place], [place] [address]          Radiology  Studies: CT ABDOMEN PELVIS WO CONTRAST  Result Date: 05/21/2023 CLINICAL DATA:  Acute abdominal pain EXAM: CT ABDOMEN AND PELVIS WITHOUT CONTRAST TECHNIQUE: Multidetector CT imaging of the abdomen and pelvis was performed following the standard protocol without IV contrast. RADIATION DOSE REDUCTION: This exam was performed according to the departmental dose-optimization program which includes automated exposure control, adjustment of the mA and/or kV according to patient size and/or use of iterative reconstruction technique. COMPARISON:  02/27/2023 FINDINGS: Lower chest: Lung bases show no focal infiltrate. New part solid nodule is identified within the right lower lobe best seen on image number 6 of series 4. This measures approximately 16 x 13 mm. The solid component measures up to 6 mm. A similar-appearing lesion is noted along the major fissure on image number 1 of series 4. These were not seen on prior exam and are likely postinflammatory in nature. Hepatobiliary: Liver is within normal limits. Gallbladder demonstrates a few dependent gallstones. No biliary ductal dilatation is seen. Pancreas: Unremarkable. No pancreatic ductal dilatation or surrounding inflammatory changes. Spleen: Normal in size without focal abnormality. Adrenals/Urinary Tract: Adrenal glands are within normal limits. Kidneys demonstrate a small nonobstructing right renal stone measuring 1-2 mm. No obstructive changes are seen. The bladder is decompressed. Stomach/Bowel: No obstructive or inflammatory changes of the colon are seen. The appendix is within normal limits. Small bowel and stomach are unremarkable. Vascular/Lymphatic: Aortic  atherosclerosis. No enlarged abdominal or pelvic lymph nodes. Reproductive: Prostate is unremarkable. Other: No abdominal wall hernia or abnormality. No abdominopelvic ascites. Musculoskeletal: No acute or significant osseous findings. IMPRESSION: Tiny nonobstructing right renal stone. No acute  intra-abdominal abnormality is noted. Part solid nodules are seen in the right lower and right middle lobe as described. These are not seen on the recent exam are likely postinflammatory in nature. Non-contrast chest CT at 3-6 months is recommended. If nodules persist, subsequent management will be based upon the most suspicious nodule(s). This recommendation follows the consensus statement: Guidelines for Management of Incidental Pulmonary Nodules Detected on CT Images: From the Fleischner Society 2017; Radiology 2017; 284:228-243. Cholelithiasis without complicating factors. Electronically Signed   By: Alcide Clever M.D.   On: 05/21/2023 00:49   DG Chest 2 View  Result Date: 05/20/2023 CLINICAL DATA:  Suspect sepsis EXAM: CHEST - 2 VIEW COMPARISON:  Chest x-ray 03/21/2023 FINDINGS: The heart is enlarged, unchanged. There is no focal infiltrate, pleural effusion or pneumothorax. Radiopaque foreign bodies are again seen in the posterior chest wall. No acute fractures are seen. IMPRESSION: 1. No active cardiopulmonary disease. 2. Stable cardiomegaly. Electronically Signed   By: Darliss Cheney M.D.   On: 05/20/2023 22:59        Scheduled Meds:  apixaban  5 mg Oral BID   atorvastatin  80 mg Oral Daily   carvedilol  25 mg Oral BID   Chlorhexidine Gluconate Cloth  6 each Topical Q0600   clopidogrel  75 mg Oral Daily   doxercalciferol  5 mcg Intravenous Q M,W,F-HD   feeding supplement (NEPRO CARB STEADY)  237 mL Oral BID BM   finasteride  5 mg Oral Daily   furosemide  80 mg Oral BID   gabapentin  300 mg Oral BID   heparin sodium (porcine)       insulin aspart  0-6 Units Subcutaneous TID WC   isosorbide mononitrate  60 mg Oral Daily   pantoprazole  40 mg Oral Daily   pentafluoroprop-tetrafluoroeth       sevelamer carbonate  800 mg Oral TID WC   sodium chloride flush  3 mL Intravenous Q12H   tamsulosin  0.4 mg Oral QHS   Continuous Infusions:  sodium chloride     cefTRIAXone (ROCEPHIN)  IV  Stopped (05/21/23 1047)     LOS: 1 day    Time spent: 45 minutes spent on chart review, discussion with nursing staff, consultants, updating family and interview/physical exam; more than 50% of that time was spent in counseling and/or coordination of care.    Joseph Art, DO [facility] Available via Epic secure chat 7am-7pm After these hours, please refer to coverage provider listed on [email or web address] 05/22/2023, 1:33 PM

## 2023-05-22 NOTE — Progress Notes (Signed)
Subjective: Seen on hemodialysis tolerating minimal UF.  States feeling vastly better no nausea no vomiting no abdominal pain this a.m. and afebrile  Objective Vital signs in last 24 hours: Vitals:   05/22/23 0930 05/22/23 0941 05/22/23 1000 05/22/23 1030  BP: (!) 165/64 (!) 165/64 (!) 171/72 (!) 169/98  Pulse: 74 75 75 74  Resp: 19 18 (!) 23 19  Temp:      TempSrc:      SpO2: 96% 94% 93% 96%  Weight:      Height:       Weight change:   Physical Exam: General = elderly male alert, no distress Lungs = CTA bilaterally nonlabored  Cardiac =RRR no RG Abdomen = NABS soft ntnd  Extremities =no LE or UE edema, Dialysis access= patent on HD LUA AVF    Home meds include - aspirin, coreg 12.5 , losartan 50, tylenol, amlodipine 5, apixaban, clopidogrel, finasteride, furosemide 80 bid, gabapentin, hydralazine 100 bid, imdur 60, emla cream, linzess, claritin, insulin novolog, prilosec, miralax, crestor, renvela 800 ac, tamsulosin      OP HD: SW MWF 4h  425/ 800  82.5kg  2/2 bath  AVF  Heparin 5000 - last OP HD 7/22, post wt 80.5kg - dry wt being lowered 2-3 times last 2 weeks - hectorol 5 mcg IV three times per week -  no esa     Problem/Plan: Fever/ diarrhea -resolved, w/u in progress per pmd.  ESRD - on HD MWF.  On schedule no missed HD. HTN/ volume - close to dry wt, small UF w/ HD tolerating. BP's stable on coreg w/ other home meds on hold for now.  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 Follow.And if still below 10 start ESA MBD ckd - CCa in range.  Phosphorus 3.2 cont renvela as binder, and IV vdra w/ HD three times per week.  H/o PAD H/o CAD Atrial fib - paroxysmal DM2 - on insulineneral:  William Pastel, PA-C [facility] [phone] 05/22/2023,10:56 AM  LOS: 1 day   Labs: Basic Metabolic Panel: Recent Labs  Lab 05/20/23 2224 05/21/23 0804 05/21/23 1734 05/22/23 0809  NA 139 135  --  133*  K 2.9* 3.1*  --  3.4*  CL 91* 94*  --  91*  CO2  31 26  --  23  GLUCOSE 124* 137*  --  123*  BUN 23 30*  --  43*  CREATININE 4.75* 5.27*  --  6.80*  CALCIUM 8.4* 8.0*  --  8.5*  PHOS  --   --  2.6 3.2   Liver Function Tests: Recent Labs  Lab 05/20/23 2224 05/21/23 0804 05/22/23 0809  AST 20 19  --   ALT 10 10  --   ALKPHOS 64 61  --   BILITOT 0.8 0.5  --   PROT 6.9 6.4*  --   ALBUMIN 2.7* 2.5* 2.3*   No results for input(s): "LIPASE", "AMYLASE" in the last 168 hours. No results for input(s): "AMMONIA" in the last 168 hours. CBC: Recent Labs  Lab 05/20/23 2224 05/21/23 0804 05/21/23 1734 05/22/23 0810  WBC 13.8* 13.7*  --  14.4*  NEUTROABS 12.0*  --   --  13.2*  HGB 10.5* 9.7* 9.5* 9.5*  HCT 32.8* 29.5* 29.5* 29.8*  MCV 95.6 93.7  --  94.3  PLT 162 167  --  196   Cardiac Enzymes: No results for input(s): "CKTOTAL", "CKMB", "CKMBINDEX", "TROPONINI" in the last 168 hours.  CBG: Recent Labs  Lab 05/21/23 1131 05/21/23 1642 05/21/23 2037 05/21/23 2346 05/22/23 0649  GLUCAP 124* 118* 106* 105* 99    Studies/Results: CT ABDOMEN PELVIS WO CONTRAST  Result Date: 05/21/2023 CLINICAL DATA:  Acute abdominal pain EXAM: CT ABDOMEN AND PELVIS WITHOUT CONTRAST TECHNIQUE: Multidetector CT imaging of the abdomen and pelvis was performed following the standard protocol without IV contrast. RADIATION DOSE REDUCTION: This exam was performed according to the departmental dose-optimization program which includes automated exposure control, adjustment of the mA and/or kV according to patient size and/or use of iterative reconstruction technique. COMPARISON:  02/27/2023 FINDINGS: Lower chest: Lung bases show no focal infiltrate. New part solid nodule is identified within the right lower lobe best seen on image number 6 of series 4. This measures approximately 16 x 13 mm. The solid component measures up to 6 mm. A similar-appearing lesion is noted along the major fissure on image number 1 of series 4. These were not seen on prior exam and  are likely postinflammatory in nature. Hepatobiliary: Liver is within normal limits. Gallbladder demonstrates a few dependent gallstones. No biliary ductal dilatation is seen. Pancreas: Unremarkable. No pancreatic ductal dilatation or surrounding inflammatory changes. Spleen: Normal in size without focal abnormality. Adrenals/Urinary Tract: Adrenal glands are within normal limits. Kidneys demonstrate a small nonobstructing right renal stone measuring 1-2 mm. No obstructive changes are seen. The bladder is decompressed. Stomach/Bowel: No obstructive or inflammatory changes of the colon are seen. The appendix is within normal limits. Small bowel and stomach are unremarkable. Vascular/Lymphatic: Aortic atherosclerosis. No enlarged abdominal or pelvic lymph nodes. Reproductive: Prostate is unremarkable. Other: No abdominal wall hernia or abnormality. No abdominopelvic ascites. Musculoskeletal: No acute or significant osseous findings. IMPRESSION: Tiny nonobstructing right renal stone. No acute intra-abdominal abnormality is noted. Part solid nodules are seen in the right lower and right middle lobe as described. These are not seen on the recent exam are likely postinflammatory in nature. Non-contrast chest CT at 3-6 months is recommended. If nodules persist, subsequent management will be based upon the most suspicious nodule(s). This recommendation follows the consensus statement: Guidelines for Management of Incidental Pulmonary Nodules Detected on CT Images: From the Fleischner Society 2017; Radiology 2017; 284:228-243. Cholelithiasis without complicating factors. Electronically Signed   By: Alcide Clever M.D.   On: 05/21/2023 00:49   DG Chest 2 View  Result Date: 05/20/2023 CLINICAL DATA:  Suspect sepsis EXAM: CHEST - 2 VIEW COMPARISON:  Chest x-ray 03/21/2023 FINDINGS: The heart is enlarged, unchanged. There is no focal infiltrate, pleural effusion or pneumothorax. Radiopaque foreign bodies are again seen in the  posterior chest wall. No acute fractures are seen. IMPRESSION: 1. No active cardiopulmonary disease. 2. Stable cardiomegaly. Electronically Signed   By: Darliss Cheney M.D.   On: 05/20/2023 22:59   Medications:  sodium chloride     cefTRIAXone (ROCEPHIN)  IV Stopped (05/21/23 1047)    apixaban  5 mg Oral BID   atorvastatin  80 mg Oral Daily   carvedilol  25 mg Oral BID   Chlorhexidine Gluconate Cloth  6 each Topical Q0600   clopidogrel  75 mg Oral Daily   doxercalciferol  5 mcg Intravenous Q M,W,F-HD   feeding supplement (NEPRO CARB STEADY)  237 mL Oral BID BM   finasteride  5 mg Oral Daily   furosemide  80 mg Oral BID   gabapentin  300 mg Oral BID   heparin sodium (porcine)       insulin aspart  0-6 Units  Subcutaneous TID WC   isosorbide mononitrate  60 mg Oral Daily   pantoprazole  40 mg Oral Daily   pentafluoroprop-tetrafluoroeth       sevelamer carbonate  800 mg Oral TID WC   sodium chloride flush  3 mL Intravenous Q12H   tamsulosin  0.4 mg Oral QHS

## 2023-05-22 NOTE — TOC Initial Note (Signed)
Transition of Care [facility]) - Initial/Assessment Note    Patient Details  Name: William Webb MRN: [id number] Date of Birth: Nov 06, 1950  [facility] CM/SW Contact:    Tom-Johnson, Hershal Coria, RN Phone Number: 05/22/2023, 11:09 AM  Clinical Narrative:                  CM spoke with patient at bedside about needs for post hospital transition.  Presented to the ED with Generalized weakness and diarrhea. Found to have Temp at 103, WBC 13.8. Admit for Elevated Temp 2/2 Infection, source unknown. Blood cx pending at this time. On IV abx. On Eliquis for Hx of A-fib. Has Hx of ESRD, on MWF dialysis schedule. Nephrology following for inpatient dialysis.   From home alone, has four children, not supportive. Nephew, [name] and two siblings supportive.  Retired, uses [employer] transportation to and from outpatient dialysis.   PCP is Irven Coe, MD and uses [facility] on [address]   CM consulted for Medication Assistance. Patient states he has [facility] Medicare but cannot afford to pay for his Novolog insulin. States it costs $250 per box and was told they could not sell individually.  CM gave a [employer] Patient Surveyor, quantity to patient to fill out and also to give to his PCP the Prescriber section for them to fill out and fax out.  CM explained the application process to patient and informed him that the application ids for eligibility and not guaranteed approval. Patient voiced understanding.   Medical workup continues. CM will continue to follow as patient progresses with care towards discharge.       Barriers to Discharge: Continued Medical Work up   Patient Goals and CMS Choice Patient states their goals for this hospitalization and ongoing recovery are:: To return home CMS Medicare.gov Compare Post Acute Care list provided to:: Patient Choice offered to / list presented to : Patient      Expected Discharge Plan and Services    Discharge Planning Services: CM Consult   Living arrangements for the past 2 months: Apartment                                      Prior Living Arrangements/Services Living arrangements for the past 2 months: Apartment Lives with:: Self Patient language and need for interpreter reviewed:: Yes Do you feel safe going back to the place where you live?: Yes      Need for Family Participation in Patient Care: Yes (Comment) Care giver support system in place?: Yes (comment) Current home services: DME (Cane, walker, shower seat.) Criminal Activity/Legal Involvement Pertinent to Current Situation/Hospitalization: No - Comment as needed  Activities of Daily Living Home Assistive Devices/Equipment: CBG Meter, Cane (specify quad or straight), Blood pressure cuff ADL Screening (condition at time of admission) Patient's cognitive ability adequate to safely complete daily activities?: Yes Is the patient deaf or have difficulty hearing?: No Does the patient have difficulty seeing, even when wearing glasses/contacts?: No Does the patient have difficulty concentrating, remembering, or making decisions?: No Patient able to express need for assistance with ADLs?: Yes Does the patient have difficulty dressing or bathing?: No Independently performs ADLs?: Yes (appropriate for developmental age) Does the patient have difficulty walking or climbing stairs?: Yes Weakness of Legs: Both Weakness of Arms/Hands: None  Permission Sought/Granted Permission sought to share information with : Case Manager, Family Supports  Permission granted to share information with : Yes, Verbal Permission Granted              Emotional Assessment Appearance:: Appears stated age Attitude/Demeanor/Rapport: Engaged, Gracious Affect (typically observed): Accepting, Appropriate, Calm, Hopeful, Pleasant Orientation: : Oriented to Self, Oriented to Place, Oriented to  Time, Oriented to Situation Alcohol / Substance  Use: Not Applicable Psych Involvement: No (comment)  Admission diagnosis:  Elevated temperature due to infection [B99.9] Fever, unspecified fever cause [R50.9] Diarrhea, unspecified type [R19.7] Patient Active Problem List   Diagnosis Date Noted   High temperature 05/21/2023   Leukocytosis 05/21/2023   History of CAD (coronary artery disease) 05/21/2023   Elevated temperature due to infection 05/21/2023   Lung nodule 05/21/2023   Hemorrhoids 03/22/2023   Internal hemorrhoid 03/22/2023   Bright red blood per rectum 03/21/2023   Prolonged QT interval 03/21/2023   Precordial pain    Critical limb ischemia of right lower extremity (HCC)    Acute osteomyelitis of toe, right (HCC) 03/13/2022   Osteomyelitis (HCC) 03/13/2022   Elevated troponin    SIRS (systemic inflammatory response syndrome) (HCC) 03/12/2022   ESRD (end stage renal disease) (HCC) 10/19/2021   Community acquired pneumonia 02/01/2021   PNA (pneumonia) 02/01/2021   Hypoxia    Pleural effusion on right 12/02/2020   Pericardial effusion 11/02/2020   Loss of consciousness (HCC) 09/16/2020   Syncope and collapse 09/15/2020   CKD (chronic kidney disease), stage IV (HCC) 08/11/2020   Acute kidney injury superimposed on CKD (HCC) 07/31/2020   Hyperlipidemia associated with type 2 diabetes mellitus (HCC) 07/31/2020   Paroxysmal atrial fibrillation (HCC) 07/31/2020   Chronic disease anemia 07/31/2020   Nausea and vomiting 07/31/2020   Peripheral artery disease (HCC) 03/19/2020   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, limited to breakdown of skin (HCC) 01/08/2020   Status post vascular surgery 12/07/2019   Respiratory failure (HCC) 12/07/2019   Persistent atrial fibrillation (HCC) 07/10/2019   H/O: GI bleed 07/10/2019   CKD (chronic kidney disease) stage 5, GFR less than 15 ml/min (HCC) 06/08/2019   Erectile dysfunction 05/06/2019   Coronary artery disease involving native coronary artery of native heart without angina  pectoris 04/05/2019   Acute on chronic respiratory failure with hypoxia (HCC) 12/10/2018   Healthcare-associated pneumonia 12/10/2018   Hemoptysis 12/10/2018   Sepsis (HCC) 12/10/2018   Acute respiratory failure with hypoxia (HCC) 12/10/2018   Malnutrition of moderate degree 12/02/2018   CAD (coronary artery disease) 12/01/2018   Syncope 12/01/2018   DOE (dyspnea on exertion) 11/18/2018   Hypertension associated with diabetes (HCC)    Hyperlipidemia    Diabetes mellitus with renal complications (HCC)    Noncompliance with medication regimen    Chronic pain syndrome 05/29/2018   Insulin dependent type 2 diabetes mellitus (HCC) 04/07/2018   Stage 3a chronic kidney disease (HCC) 03/11/2018   Fall 11/20/2017   AKI (acute kidney injury) (HCC) 11/20/2017   Normocytic normochromic anemia 11/20/2017   Hypoglycemia 11/19/2017   Essential hypertension 11/19/2017   PCP:  Irven Coe, MD Pharmacy:   [id number] [place], [place] - [address] [place] [address] Phone: [phone] Fax: [phone]     Social Determinants of Health (SDOH) Social History: SDOH Screenings   Food Insecurity: No Food Insecurity (05/21/2023)  Housing: Low Risk  (05/21/2023)  Transportation Needs: No Transportation Needs (03/21/2023)  Utilities: Not At Risk (05/21/2023)  Depression (PHQ2-9): Low Risk  (10/07/2020)  Tobacco Use: Medium  Risk (05/21/2023)   SDOH Interventions: Transportation Interventions: Intervention Not Indicated, Inpatient TOC, Patient Resources (Friends/Family)   Readmission Risk Interventions    05/22/2023   11:05 AM 03/22/2023    4:41 PM 03/19/2022    3:43 PM  Readmission Risk Prevention Plan  Transportation Screening Complete Complete Complete  Medication Review (RN Care Manager) Referral to Pharmacy Complete Complete  PCP or Specialist appointment within 3-5 days of discharge Complete Complete Complete  HRI or Home Care Consult Complete  Complete Complete  SW Recovery Care/Counseling Consult Complete Complete Complete  Palliative Care Screening Not Applicable Not Applicable Not Applicable  Skilled Nursing Facility Not Applicable Not Applicable Not Applicable

## 2023-05-22 NOTE — Progress Notes (Signed)
Pt receives out-pt HD at [facility] on MWF. Will assist as needed.   Tracy Mounce Renal Navigator [phone] 

## 2023-05-22 NOTE — Progress Notes (Signed)
   05/22/23 1143  Vitals  Temp 99.2 F (37.3 C)  Pulse Rate 76  Resp 15  BP (!) 177/67  SpO2 97 %  O2 Device Room Air  Weight 78.1 kg  Type of Weight Pre-Dialysis  During Treatment Monitoring  HD Safety Checks Performed Yes  Intra-Hemodialysis Comments Tx completed;Tolerated well;Progressing as prescribed  Dialysis Fluid Bolus Normal Saline  Bolus Amount (mL) 300 mL   Received patient in bed to unit.  Alert and oriented.  Informed consent signed and in chart.   TX duration:3.5HRS  Patient tolerated well.  Transported back to the room  Alert, without acute distress.  Hand-off given to patient's nurse.   Access used: LUA AVF Access issues: NONE  Total UF removed: 2L Medication(s) given: NONE    [facility]

## 2023-05-22 NOTE — Plan of Care (Signed)
  Problem: Education: Goal: Ability to describe self-care measures that may prevent or decrease complications (Diabetes Survival Skills Education) will improve Outcome: Progressing   Problem: Coping: Goal: Ability to adjust to condition or change in health will improve Outcome: Progressing   Problem: Fluid Volume: Goal: Ability to maintain a balanced intake and output will improve Outcome: Progressing   Problem: Health Behavior/Discharge Planning: Goal: Ability to identify and utilize available resources and services will improve Outcome: Progressing   Problem: Nutritional: Goal: Maintenance of adequate nutrition will improve Outcome: Progressing Goal: Progress toward achieving an optimal weight will improve Outcome: Progressing

## 2023-05-23 DIAGNOSIS — B999 Unspecified infectious disease: Secondary | ICD-10-CM | POA: Diagnosis not present

## 2023-05-23 LAB — CBC
HCT: 30.7 % — ABNORMAL LOW (ref 39.0–52.0)
Hemoglobin: 10.1 g/dL — ABNORMAL LOW (ref 13.0–17.0)
MCH: 31.2 pg (ref 26.0–34.0)
MCHC: 32.9 g/dL (ref 30.0–36.0)
MCV: 94.8 fL (ref 80.0–100.0)
RBC: 3.24 MIL/uL — ABNORMAL LOW (ref 4.22–5.81)
RDW: 14.6 % (ref 11.5–15.5)
nRBC: 0 % (ref 0.0–0.2)

## 2023-05-23 LAB — BASIC METABOLIC PANEL
Anion gap: 16 — ABNORMAL HIGH (ref 5–15)
BUN: 32 mg/dL — ABNORMAL HIGH (ref 8–23)
CO2: 25 mmol/L (ref 22–32)
Calcium: 8.3 mg/dL — ABNORMAL LOW (ref 8.9–10.3)
Chloride: 92 mmol/L — ABNORMAL LOW (ref 98–111)
Creatinine, Ser: 5.11 mg/dL — ABNORMAL HIGH (ref 0.61–1.24)
Glucose, Bld: 174 mg/dL — ABNORMAL HIGH (ref 70–99)
Potassium: 3.2 mmol/L — ABNORMAL LOW (ref 3.5–5.1)
Sodium: 133 mmol/L — ABNORMAL LOW (ref 135–145)

## 2023-05-23 LAB — GLUCOSE, CAPILLARY
Glucose-Capillary: 164 mg/dL — ABNORMAL HIGH (ref 70–99)
Glucose-Capillary: 167 mg/dL — ABNORMAL HIGH (ref 70–99)
Glucose-Capillary: 148 mg/dL — ABNORMAL HIGH (ref 70–99)
Glucose-Capillary: 174 mg/dL — ABNORMAL HIGH (ref 70–99)

## 2023-05-23 LAB — CULTURE, BLOOD (ROUTINE X 2): Culture: NO GROWTH

## 2023-05-23 MED ORDER — POTASSIUM CHLORIDE CRYS ER 20 MEQ PO TBCR
40.0000 meq | EXTENDED_RELEASE_TABLET | Freq: Once | ORAL | Status: AC
  Administered 2023-05-23: 40 meq via ORAL
  Filled 2023-05-23: qty 2

## 2023-05-23 NOTE — Progress Notes (Signed)
Ok to replace k with x1 per Dr. Benjamine Mola.  Ulyses Southward, PharmD, BCIDP, AAHIVP, CPP Infectious Disease Pharmacist 05/23/2023 7:29 AM

## 2023-05-23 NOTE — Discharge Instructions (Signed)

## 2023-05-23 NOTE — Consult Note (Signed)
   [facility] Inpatient Consult   05/23/2023  William Webb [facility] 15-Nov-1950 [id number]  [facility] [[facility]]  Accountable Care Organization [ACO] Patient:  Advertising copywriter Medicare  Primary Care Provider:  Irven Coe, MD with [facility]   Patient screened for hospitalization with noted extreme high risk score for unplanned readmission risk 2 day length of stay and to assess for potential [facility] Coordination service needs for post hospital transition for care coordination.  Review of patient's electronic medical record reveals patient is from home alone, uses medical transportation for HD. Current HD schedule is noted on MWF.  Spoke with the patient via bedside hospital phone regarding any follow up needs.  Patient acknowledges PCP and needs for diabetes medication for insulin cost.  Reviewed inpatient [facility] team notes and SDOH was reviewed as well.  Plan:  Continue to follow progress and disposition to assess for post hospital community care coordination/management needs.  Referral request for community care coordination: continue to follow and refer as needed to appropriate care coordination team.  Of note, [facility] Care Management/Population Health does not replace or interfere with any arrangements made by the Inpatient Transition of Care team.  For questions contact:   Charlesetta Shanks, RN BSN CCM [facility] Liaison  [phone] business mobile phone Toll free office [phone]  *Concierge Line  [phone] Fax number: [phone] [email or web address]

## 2023-05-23 NOTE — Progress Notes (Signed)
PROGRESS NOTE    Mason Dibiasio [facility]  WUJ:[id number] DOB: [birth year] DOA: 05/20/2023 PCP: Irven Coe, MD    Brief Narrative:  William Webb is a 72 y.o. male with medical history significant of ESRD on HD MWF, PAF on Eliquis, HTN, HLD, CAD, PAD, insulin-dependent DM-2 and internal hemorrhoids presented to emergency department as he developed some chills and 1 episode of loose stool at home.  Patient reported he noticed some chills since last 24 hours and he has 1 episode of loose stool on Sunday night.    Assessment and Plan: Infection of unknown origin Elevated temperature Leukocytosis Loose stool-1 episode -Patient coming with complaining of chills, 1 episode of loose stool on Sunday and at presentation to ED found temperature spike to 103.  WBC slightly elevated 13.1. Denies any malaise, fatigue, rash, ear/discharge, sinus pain/congestion, photophobia, eye pain/discharge/redness, chest pain, palpitation, orthopnea, leg swelling, cough, sputum production, shortness of breath, wheezing, vomiting, abdominal pain, constipation, increased urinary urgency/dysuria, back pain, neck pain, joint pain, headache, dizziness, change of appetite and weight.  Physical exam unremarkable to identify any source of infection.  Left-sided AV fistula site looks healthy without any sign of infection. -Negative COVID. - Chest x-ray unremarkable no acute disease process - Patient reported loose stool and no recent antibiotic use.  CT abdomen obtained did not showed any acute intra-abdominal process and cholelithiasis. -Given patient still makes urine checking UA and urine culture.   - blood culture x 2- NGTD -NP swab -Given patient is immunocompromised in the setting of  ESRD planning to continue empiric antibiotic coverage with ceftriaxone 2 g daily.  Will follow-up with blood and urine culture result for appropriate antibiotic guidance -Continue to monitor CBC and fever.   Loose stool-1 episode -  No recent antibiotic use. - C. difficile screen with PCR and GI panel -negative   Asymptomatic cholelithiasis - CT abdomen and pelvis showed incidental finding of cholelithiasis.  Patient denies any abdominal pain and right upper quadrant pain. -Stable hepatic panel. - Continue to monitor   Hypokalemia - replete   Incidental finding of lung nodule - CT abdomen pelvis showed incidental initial finding of solid nodules right lower and right middle lobe.  Recommended noncontrast chest CT in 3 to 6 months for follow-up. - Patient needs outpatient follow-up with PCP for repeat chest CT in 3-6 months   ESRD on HD MWF  - Patient is euvolemic on physical exam - Compliant with dialysis.  Last dialysis 7/24 -renal consulted   Essential hypertension Repeated blood pressure - Blood pressure 145/61 on presentation.  And heart rate 82. -Per chart review from previous hospital discharge on May 2024 patient supposed to be on amlodipine 5 mg daily, Coreg 25 mg twice daily, Lasix 40 mg daily, losartan 100 mg daily hydralazine 100 mg 3 times daily and Imdur 25 mg daily.  Patient reported he ran out of few blood pressure regimen and not sure what he is taking currently at home. -Blood pressure borderline elevated around 130 to 140. Resuming Coreg 25 mg twice daily.  Holding other blood pressure regimen and will restart as patient tolerates.     Paroxysmal atrial fibrillation -EKG showed normal sinus rhythm and 3 PVC.  Patient reported not taking Coreg at home.  Patient reported running up to a few medications but he is continuing taking Eliquis. --Patient reported noticing streak of in the stool.  He has history of chronic internal hemorrhoid.  Hemoglobin stable around 10.  It is safe to resume  Eliquis as of now. - Continue Eliquis 5 mg twice daily and resuming Coreg 25 mg twice daily -Continue to monitor hemoglobin and development of any acute GI blood loss   History of CAD History of  PAD Hyperlipidemia. - Resuming Plavix 75 mg daily, statin and Coreg 25 mg twice daily   Internal hemorrhoid - Patient reported noticing some staker blood on the stool which is on and off.  Hemoglobin 10.5 around his baseline. - Continue to monitor for development of any overt GI bleed, monitor hemoglobin closely as patient is on Eliquis and Plavix.   Insulin dependent DM type II-well-controlled Per chart review most recent A1c 6 in May 2024 - SSI    Peripheral neuropathy - home gabapentin 300 mg twice daily   BPH - Flomax and Proscar.   GERD - Protonix 40 mg daily  OOB  DVT prophylaxis: SCDs Start: 05/21/23 0155 apixaban (ELIQUIS) tablet 5 mg    Code Status: Full Code  Disposition Plan:  Level of care: Telemetry Medical Status is: Inpatient Remains inpatient appropriate     Consultants:  renal    Subjective: No SOB, no CP Some tenderness on "tailbone"  Objective: Vitals:   05/22/23 2050 05/22/23 2317 05/23/23 0441 05/23/23 0845  BP: (!) 138/117 (!) 144/60 (!) 141/48 (!) 143/56  Pulse: 72 67 73 67  Resp: 16 16 16 17   Temp: 99.1 F (37.3 C) 98.1 F (36.7 C) (!) 101.5 F (38.6 C) 99.7 F (37.6 C)  TempSrc: Oral Oral Oral Oral  SpO2: 100% 100% 95% 95%  Weight:      Height:        Intake/Output Summary (Last 24 hours) at 05/23/2023 1133 Last data filed at 05/23/2023 0441 Gross per 24 hour  Intake 303 ml  Output 2000 ml  Net -1697 ml   Filed Weights   05/21/23 0500 05/22/23 0749 05/22/23 1143  Weight: 83.2 kg 79.5 kg 78.1 kg    Examination:   General: Appearance:    Well developed, well nourished male in no acute distress     Lungs:     respirations unlabored  Heart:    Normal heart rate.       Neurologic:   Awake, alert       Data Reviewed: I have personally reviewed following labs and imaging studies  CBC: Recent Labs  Lab 05/20/23 2224 05/21/23 0804 05/21/23 1734 05/22/23 0810 05/23/23 0220  WBC 13.8* 13.7*  --  14.4* 14.5*   NEUTROABS 12.0*  --   --  13.2*  --   HGB 10.5* 9.7* 9.5* 9.5* 10.1*  HCT 32.8* 29.5* 29.5* 29.8* 30.7*  MCV 95.6 93.7  --  94.3 94.8  PLT 162 167  --  196 202   Basic Metabolic Panel: Recent Labs  Lab 05/20/23 2224 05/21/23 0804 05/21/23 1734 05/22/23 0809 05/23/23 0220  NA 139 135  --  133* 133*  K 2.9* 3.1*  --  3.4* 3.2*  CL 91* 94*  --  91* 92*  CO2 31 26  --  23 25  GLUCOSE 124* 137*  --  123* 174*  BUN 23 30*  --  43* 32*  CREATININE 4.75* 5.27*  --  6.80* 5.11*  CALCIUM 8.4* 8.0*  8.0*  --  8.5* 8.3*  MG  --  2.0  --   --   --   PHOS  --   --  2.6 3.2  --    GFR: Estimated Creatinine Clearance: 14.2 mL/min (A) (by  C-G formula based on SCr of 5.11 mg/dL (H)). Liver Function Tests: Recent Labs  Lab 05/20/23 2224 05/21/23 0804 05/22/23 0809  AST 20 19  --   ALT 10 10  --   ALKPHOS 64 61  --   BILITOT 0.8 0.5  --   PROT 6.9 6.4*  --   ALBUMIN 2.7* 2.5* 2.3*   No results for input(s): "LIPASE", "AMYLASE" in the last 168 hours. No results for input(s): "AMMONIA" in the last 168 hours. Coagulation Profile: Recent Labs  Lab 05/20/23 2224  INR 1.4*   Cardiac Enzymes: No results for input(s): "CKTOTAL", "CKMB", "CKMBINDEX", "TROPONINI" in the last 168 hours. BNP (last 3 results) No results for input(s): "PROBNP" in the last 8760 hours. HbA1C: No results for input(s): "HGBA1C" in the last 72 hours. CBG: Recent Labs  Lab 05/22/23 1311 05/22/23 1708 05/22/23 2053 05/23/23 0716 05/23/23 1125  GLUCAP 109* 136* 136* 164* 167*   Lipid Profile: No results for input(s): "CHOL", "HDL", "LDLCALC", "TRIG", "CHOLHDL", "LDLDIRECT" in the last 72 hours. Thyroid Function Tests: No results for input(s): "TSH", "T4TOTAL", "FREET4", "T3FREE", "THYROIDAB" in the last 72 hours. Anemia Panel: Recent Labs    05/21/23 0804  VITAMINB12 2,739*  FOLATE 17.0  FERRITIN 2,383*  TIBC NOT CALCULATED  IRON 24*  RETICCTPCT 0.5   Sepsis Labs: Recent Labs  Lab  05/20/23 2230 05/21/23 0804  LATICACIDVEN 1.1 1.1    Recent Results (from the past 240 hour(s))  Culture, blood (Routine x 2)     Status: None (Preliminary result)   Collection Time: 05/20/23  9:49 PM   Specimen: BLOOD  Result Value Ref Range Status   Specimen Description BLOOD SITE NOT SPECIFIED  Final   Special Requests   Final    BOTTLES DRAWN AEROBIC AND ANAEROBIC Blood Culture results may not be optimal due to an inadequate volume of blood received in culture bottles   Culture   Final    NO GROWTH 3 DAYS Performed at [facility], [address], [place], [place] [address]    Report Status PENDING  Incomplete  SARS Coronavirus 2 by RT PCR (hospital order, performed in [facility] hospital lab) *cepheid single result test* Anterior Nasal Swab     Status: None   Collection Time: 05/20/23 10:27 PM   Specimen: Anterior Nasal Swab  Result Value Ref Range Status   SARS Coronavirus 2 by RT PCR NEGATIVE NEGATIVE Final    Comment: Performed at [facility], [address] [place], [place] [address]  Culture, blood (Routine x 2)     Status: None (Preliminary result)   Collection Time: 05/20/23 10:29 PM   Specimen: BLOOD  Result Value Ref Range Status   Specimen Description BLOOD SITE NOT SPECIFIED  Final   Special Requests   Final    BOTTLES DRAWN AEROBIC AND ANAEROBIC Blood Culture adequate volume   Culture   Final    NO GROWTH 3 DAYS Performed at [facility], [address], [place], [place] [address]    Report Status PENDING  Incomplete  Gastrointestinal Panel by PCR , Stool     Status: None   Collection Time: 05/21/23  3:52 AM   Specimen: Stool  Result Value Ref Range Status   Campylobacter species NOT DETECTED NOT DETECTED Final   Plesimonas shigelloides NOT DETECTED NOT DETECTED Final   Salmonella species NOT DETECTED NOT DETECTED Final   Yersinia enterocolitica NOT DETECTED NOT DETECTED Final   Vibrio species  NOT DETECTED NOT DETECTED Final   Vibrio  cholerae NOT DETECTED NOT DETECTED Final   Enteroaggregative E coli (EAEC) NOT DETECTED NOT DETECTED Final   Enteropathogenic E coli (EPEC) NOT DETECTED NOT DETECTED Final   Enterotoxigenic E coli (ETEC) NOT DETECTED NOT DETECTED Final   Shiga like toxin producing E coli (STEC) NOT DETECTED NOT DETECTED Final   Shigella/Enteroinvasive E coli (EIEC) NOT DETECTED NOT DETECTED Final   Cryptosporidium NOT DETECTED NOT DETECTED Final   Cyclospora cayetanensis NOT DETECTED NOT DETECTED Final   Entamoeba histolytica NOT DETECTED NOT DETECTED Final   Giardia lamblia NOT DETECTED NOT DETECTED Final   Adenovirus F40/41 NOT DETECTED NOT DETECTED Final   Astrovirus NOT DETECTED NOT DETECTED Final   Norovirus GI/GII NOT DETECTED NOT DETECTED Final   Rotavirus A NOT DETECTED NOT DETECTED Final   Sapovirus (I, II, IV, and V) NOT DETECTED NOT DETECTED Final    Comment: Performed at [facility], [address] [place], [place] [address]  C Difficile Quick Screen w PCR reflex     Status: None   Collection Time: 05/21/23  3:52 AM   Specimen: Stool  Result Value Ref Range Status   C Diff antigen NEGATIVE NEGATIVE Final   C Diff toxin NEGATIVE NEGATIVE Final   C Diff interpretation No C. difficile detected.  Final    Comment: Performed at [facility], [address], [place], [place] [address]  MRSA Next Gen by PCR, Nasal     Status: None   Collection Time: 05/21/23  3:54 AM   Specimen: Nasal Mucosa; Nasal Swab  Result Value Ref Range Status   MRSA by PCR Next Gen NOT DETECTED NOT DETECTED Final    Comment: (NOTE) The GeneXpert MRSA Assay (FDA approved for NASAL specimens only), is one component of a comprehensive MRSA colonization surveillance program. It is not intended to diagnose MRSA infection nor to guide or monitor treatment for MRSA infections. Test performance is not FDA approved in patients less than 5 years old. Performed at [facility], [address]  [place], [place] [address]          Radiology Studies: No results found.      Scheduled Meds:  apixaban  5 mg Oral BID   atorvastatin  80 mg Oral Daily   carvedilol  25 mg Oral BID   Chlorhexidine Gluconate Cloth  6 each Topical Q0600   clopidogrel  75 mg Oral Daily   doxercalciferol  5 mcg Intravenous Q M,W,F-HD   feeding supplement (NEPRO CARB STEADY)  237 mL Oral BID BM   finasteride  5 mg Oral Daily   furosemide  80 mg Oral BID   gabapentin  300 mg Oral BID   insulin aspart  0-6 Units Subcutaneous TID WC   isosorbide mononitrate  60 mg Oral Daily   pantoprazole  40 mg Oral Daily   sevelamer carbonate  800 mg Oral TID WC   sodium chloride flush  3 mL Intravenous Q12H   tamsulosin  0.4 mg Oral QHS   Continuous Infusions:  sodium chloride     cefTRIAXone (ROCEPHIN)  IV 2 g (05/23/23 1011)     LOS: 2 days    Time spent: 45 minutes spent on chart review, discussion with nursing staff, consultants, updating family and interview/physical exam; more than 50% of that time was spent in counseling and/or coordination of care.    Joseph Art, DO [facility] Available via Epic secure  chat 7am-7pm After these hours, please refer to coverage provider listed on [email or web address] 05/23/2023, 11:33 AM

## 2023-05-23 NOTE — Plan of Care (Signed)
  Problem: Education: Goal: Ability to describe self-care measures that may prevent or decrease complications (Diabetes Survival Skills Education) will improve Outcome: Not Progressing Goal: Individualized Educational Video(s) Outcome: Not Progressing   Problem: Health Behavior/Discharge Planning: Goal: Ability to identify and utilize available resources and services will improve Outcome: Not Progressing Goal: Ability to manage health-related needs will improve Outcome: Not Progressing   Problem: Education: Goal: Knowledge of General Education information will improve Description: Including pain rating scale, medication(s)/side effects and non-pharmacologic comfort measures Outcome: Not Progressing   Problem: Health Behavior/Discharge Planning: Goal: Ability to manage health-related needs will improve Outcome: Not Progressing   Problem: Clinical Measurements: Goal: Ability to maintain clinical measurements within normal limits will improve Outcome: Not Progressing   Problem: Elimination: Goal: Will not experience complications related to bowel motility Outcome: Not Progressing

## 2023-05-23 NOTE — Progress Notes (Signed)
   05/23/23 0441  Assess: MEWS Score  Temp (!) 101.5 F (38.6 C)  BP (!) 141/48  MAP (mmHg) 75  Pulse Rate 73  Resp 16  Level of Consciousness Alert  SpO2 95 %  O2 Device Room Air  Assess: MEWS Score  MEWS Temp 2  MEWS Systolic 0  MEWS Pulse 0  MEWS RR 0  MEWS LOC 0  MEWS Score 2  MEWS Score Color Yellow  Assess: if the MEWS score is Yellow or Red  Were vital signs accurate and taken at a resting state? Yes  Does the patient meet 2 or more of the SIRS criteria? No  MEWS guidelines implemented  Yes, yellow  Treat  MEWS Interventions Considered administering scheduled or prn medications/treatments as ordered  Take Vital Signs  Increase Vital Sign Frequency  Yellow: Q2hr x1, continue Q4hrs until patient remains green for 12hrs  Escalate  MEWS: Escalate Yellow: Discuss with charge nurse and consider notifying provider and/or RRT  Notify: Charge Nurse/RN  Name of Charge Nurse/RN Notified Emman, RN  Assess: SIRS CRITERIA  SIRS Temperature  1  SIRS Pulse 0  SIRS Respirations  0  SIRS WBC 0  SIRS Score Sum  1

## 2023-05-23 NOTE — Progress Notes (Addendum)
Subjective   Objective Vital signs in last 24 hours: Vitals:   05/22/23 2050 05/22/23 2317 05/23/23 0441 05/23/23 0845  BP: (!) 138/117 (!) 144/60 (!) 141/48 (!) 143/56  Pulse: 72 67 73 67  Resp: 16 16 16 17   Temp: 99.1 F (37.3 C) 98.1 F (36.7 C) (!) 101.5 F (38.6 C) 99.7 F (37.6 C)  TempSrc: Oral Oral Oral Oral  SpO2: 100% 100% 95% 95%  Weight:      Height:       Weight change:   Physical Exam: General = alert, pleasant, no distress Lungs = CTA bilaterally nonlabored  Cardiac =RRR no RG Abdomen = NABS soft ntnd  Extremities =no LE or UE edema, Dialysis access=  LUA AVF + bruit    Home meds include - aspirin, coreg 12.5 , losartan 50, tylenol, amlodipine 5, apixaban, clopidogrel, finasteride, furosemide 80 bid, gabapentin, hydralazine 100 bid, imdur 60, emla cream, linzess, claritin, insulin novolog, prilosec, miralax, crestor, renvela 800 ac, tamsulosin      OP HD: SW MWF 4h  425/ 800  82.5kg  2/2 bath  AVF  Heparin 5000 - last OP HD 7/22, post wt 80.5kg - dry wt being lowered 2-3 times last 2 weeks - hectorol 5 mcg IV three times per week -  no esa     Problem/Plan: Fever/ diarrhea - CT abd and cdif were negative. Low grade temp and diarrhea  improving  ESRD - on HD MWF.  On schedule no missed HD.  Stable K 3.2 p.o. supplement given HTN/ volume - close to dry wt, 2 L UF tolerated yesterday BP stable.  Current weight 4 kg below dry weight.  Able no excess volume on exam.  With his Diarrhea,  no uf  tomorrow HD /BP's stable on coreg w/ other home meds on hold for now.  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 Follow.And if below 10 start ESA MBD ckd - CCa in range.  Phosphorus 3.2 cont renvela as binder, and IV vdra w/ HD three times per week.  H/o PAD H/o CAD Atrial fib - paroxysmal, on apixaban and Coreg DM2 - tx per admit team   Lenny Pastel, PA-C [facility] Beeper [phone] 05/23/2023,10:41 AM  LOS: 2 days   Pt seen, examined  and agree w A/P as above.  Vinson Moselle MD  [facility] 05/23/2023, 3:18 PM   Labs: Basic Metabolic Panel: Recent Labs  Lab 05/21/23 0804 05/21/23 1734 05/22/23 0809 05/23/23 0220  NA 135  --  133* 133*  K 3.1*  --  3.4* 3.2*  CL 94*  --  91* 92*  CO2 26  --  23 25  GLUCOSE 137*  --  123* 174*  BUN 30*  --  43* 32*  CREATININE 5.27*  --  6.80* 5.11*  CALCIUM 8.0*  8.0*  --  8.5* 8.3*  PHOS  --  2.6 3.2  --    Liver Function Tests: Recent Labs  Lab 05/20/23 2224 05/21/23 0804 05/22/23 0809  AST 20 19  --   ALT 10 10  --   ALKPHOS 64 61  --   BILITOT 0.8 0.5  --   PROT 6.9 6.4*  --   ALBUMIN 2.7* 2.5* 2.3*   No results for input(s): "LIPASE", "AMYLASE" in the last 168 hours. No results for input(s): "AMMONIA" in the last 168 hours. CBC: Recent Labs  Lab 05/20/23 2224 05/21/23 0804 05/21/23 1734 05/22/23 0810 05/23/23 0220  WBC 13.8* 13.7*  --  14.4* 14.5*  NEUTROABS 12.0*  --   --  13.2*  --   HGB 10.5* 9.7* 9.5* 9.5* 10.1*  HCT 32.8* 29.5* 29.5* 29.8* 30.7*  MCV 95.6 93.7  --  94.3 94.8  PLT 162 167  --  196 202   Cardiac Enzymes: No results for input(s): "CKTOTAL", "CKMB", "CKMBINDEX", "TROPONINI" in the last 168 hours. CBG: Recent Labs  Lab 05/22/23 0649 05/22/23 1311 05/22/23 1708 05/22/23 2053 05/23/23 0716  GLUCAP 99 109* 136* 136* 164*    Studies/Results: No results found. Medications:  sodium chloride     cefTRIAXone (ROCEPHIN)  IV 2 g (05/23/23 1011)    apixaban  5 mg Oral BID   atorvastatin  80 mg Oral Daily   carvedilol  25 mg Oral BID   Chlorhexidine Gluconate Cloth  6 each Topical Q0600   clopidogrel  75 mg Oral Daily   doxercalciferol  5 mcg Intravenous Q M,W,F-HD   feeding supplement (NEPRO CARB STEADY)  237 mL Oral BID BM   finasteride  5 mg Oral Daily   furosemide  80 mg Oral BID   gabapentin  300 mg Oral BID   insulin aspart  0-6 Units Subcutaneous TID WC   isosorbide mononitrate  60 mg Oral Daily   pantoprazole  40 mg Oral  Daily   sevelamer carbonate  800 mg Oral TID WC   sodium chloride flush  3 mL Intravenous Q12H   tamsulosin  0.4 mg Oral QHS

## 2023-05-24 DIAGNOSIS — B999 Unspecified infectious disease: Secondary | ICD-10-CM | POA: Diagnosis not present

## 2023-05-24 LAB — CBC
HCT: 30.3 % — ABNORMAL LOW (ref 39.0–52.0)
Hemoglobin: 9.8 g/dL — ABNORMAL LOW (ref 13.0–17.0)
MCH: 30.3 pg (ref 26.0–34.0)
MCHC: 32.3 g/dL (ref 30.0–36.0)
MCV: 93.8 fL (ref 80.0–100.0)
Platelets: 239 10*3/uL (ref 150–400)
RBC: 3.23 MIL/uL — ABNORMAL LOW (ref 4.22–5.81)
RDW: 14.8 % (ref 11.5–15.5)
WBC: 19.6 10*3/uL — ABNORMAL HIGH (ref 4.0–10.5)
nRBC: 0 % (ref 0.0–0.2)

## 2023-05-24 LAB — RENAL FUNCTION PANEL
Albumin: 2.1 g/dL — ABNORMAL LOW (ref 3.5–5.0)
Anion gap: 16 — ABNORMAL HIGH (ref 5–15)
BUN: 48 mg/dL — ABNORMAL HIGH (ref 8–23)
CO2: 25 mmol/L (ref 22–32)
Calcium: 8.7 mg/dL — ABNORMAL LOW (ref 8.9–10.3)
Chloride: 92 mmol/L — ABNORMAL LOW (ref 98–111)
Creatinine, Ser: 6.57 mg/dL — ABNORMAL HIGH (ref 0.61–1.24)
GFR, Estimated: 8 mL/min — ABNORMAL LOW (ref >60)
Glucose, Bld: 158 mg/dL — ABNORMAL HIGH (ref 70–99)
Phosphorus: 2.8 mg/dL (ref 2.5–4.6)
Potassium: 3.5 mmol/L (ref 3.5–5.1)
Sodium: 133 mmol/L — ABNORMAL LOW (ref 135–145)

## 2023-05-24 LAB — GLUCOSE, CAPILLARY
Glucose-Capillary: 191 mg/dL — ABNORMAL HIGH (ref 70–99)
Glucose-Capillary: 185 mg/dL — ABNORMAL HIGH (ref 70–99)
Glucose-Capillary: 150 mg/dL — ABNORMAL HIGH (ref 70–99)

## 2023-05-24 LAB — PROCALCITONIN: Procalcitonin: 32.43 ng/mL

## 2023-05-24 LAB — SARS CORONAVIRUS 2 BY RT PCR: SARS Coronavirus 2 by RT PCR: NEGATIVE

## 2023-05-24 MED ORDER — RISAQUAD PO CAPS
2.0000 | ORAL_CAPSULE | Freq: Every day | ORAL | Status: DC
  Administered 2023-05-24 – 2023-06-11 (×16): 2 via ORAL
  Filled 2023-05-24 (×17): qty 2

## 2023-05-24 MED ORDER — HEPARIN SODIUM (PORCINE) 1000 UNIT/ML DIALYSIS
3000.0000 [IU] | Freq: Once | INTRAMUSCULAR | Status: AC
  Administered 2023-05-24: 3000 [IU] via INTRAVENOUS_CENTRAL
  Filled 2023-05-24: qty 3

## 2023-05-24 MED ORDER — HEPARIN SODIUM (PORCINE) 1000 UNIT/ML DIALYSIS: 2000.0000 [IU] | INTRAMUSCULAR | Status: DC | PRN

## 2023-05-24 NOTE — Progress Notes (Signed)
PROGRESS NOTE    William Webb [facility]  ZOX:[id number] DOB: 03-21-1951 DOA: 05/20/2023 PCP: Irven Coe, MD    Brief Narrative:  William Webb is a 72 y.o. male with medical history significant of ESRD on HD MWF, PAF on Eliquis, HTN, HLD, CAD, PAD, insulin-dependent DM-2 and internal hemorrhoids presented to emergency department as he developed some chills and 1 episode of loose stool at home.  Patient reported he noticed some chills since last 24 hours and he has 1 episode of loose stool on Sunday night.    Assessment and Plan: Infection of unknown origin Elevated temperature Leukocytosis Loose stool-1 episode -Patient coming with complaining of chills, 1 episode of loose stool on Sunday and at presentation to ED found temperature spike to 103.  WBC slightly elevated 13.1. Denies any malaise, fatigue, rash, ear/discharge, sinus pain/congestion, photophobia, eye pain/discharge/redness, chest pain, palpitation, orthopnea, leg swelling, cough, sputum production, shortness of breath, wheezing, vomiting, abdominal pain, constipation, increased urinary urgency/dysuria, back pain, neck pain, joint pain, headache, dizziness, change of appetite and weight.  Physical exam unremarkable to identify any source of infection.  Left-sided AV fistula site looks healthy without any sign of infection. -Negative COVID. - Chest x-ray unremarkable no acute disease process - Patient reported loose stool and no recent antibiotic use.  CT abdomen obtained did not showed any acute intra-abdominal process and cholelithiasis. -Given patient still makes urine checking UA and urine culture.   - blood culture x 2- NGTD -NP swab/repeat COVID -Given patient is immunocompromised in the setting of  ESRD planning to continue empiric antibiotic coverage with ceftriaxone 2 g daily.  Will follow-up with blood and urine culture result for appropriate antibiotic guidance -Continue to monitor CBC and fever. -no sign of  blood clots   Loose stool-1 episode - No recent antibiotic use. - C. difficile screen with PCR and GI panel -negative   Asymptomatic cholelithiasis - CT abdomen and pelvis showed incidental finding of cholelithiasis.  Patient denies any abdominal pain and right upper quadrant pain. -Stable hepatic panel. - Continue to monitor   Hypokalemia - replete   Incidental finding of lung nodule - CT abdomen pelvis showed incidental initial finding of solid nodules right lower and right middle lobe.  Recommended noncontrast chest CT in 3 to 6 months for follow-up. - Patient needs outpatient follow-up with PCP for repeat chest CT in 3-6 months   ESRD on HD MWF  - Patient is euvolemic on physical exam - Compliant with dialysis.  Last dialysis 7/24 -renal consulted   Essential hypertension -resume home regimen   Paroxysmal atrial fibrillation -EKG showed normal sinus rhythm and 3 PVC.  Patient reported not taking Coreg at home.  Patient reported running up to a few medications but he is continuing taking Eliquis. --Patient reported noticing streak of in the stool.  He has history of chronic internal hemorrhoid.  Hemoglobin stable around 10.  It is safe to resume Eliquis as of now. - Continue Eliquis 5 mg twice daily and resuming Coreg 25 mg twice daily -Continue to monitor hemoglobin and development of any acute GI blood loss   History of CAD History of PAD Hyperlipidemia. - Resuming Plavix 75 mg daily, statin and Coreg 25 mg twice daily   Internal hemorrhoid - Patient reported noticing some staker blood on the stool which is on and off.  Hemoglobin 10.5 around his baseline. - Continue to monitor for development of any overt GI bleed, monitor hemoglobin closely as patient is on Eliquis and  Plavix.   Insulin dependent DM type II-well-controlled Per chart review most recent A1c 6 in May 2024 - SSI    Peripheral neuropathy - home gabapentin 300 mg twice daily   BPH - Flomax and  Proscar.   GERD - Protonix 40 mg daily  OOB  DVT prophylaxis: SCDs Start: 05/21/23 0155 apixaban (ELIQUIS) tablet 5 mg    Code Status: Full Code  Disposition Plan:  Level of care: Telemetry Medical Status is: Inpatient Remains inpatient appropriate     Consultants:  renal    Subjective: Denies hurting all over after Neurontin given  Objective: Vitals:   05/24/23 1101 05/24/23 1127 05/24/23 1200 05/24/23 1310  BP: (!) 153/66 (!) 154/69  (!) 151/57  Pulse: 72 73  74  Resp: 19 16  17   Temp:   97.6 F (36.4 C) 100.1 F (37.8 C)  TempSrc:   Oral Oral  SpO2: 97% 96%  100%  Weight:      Height:        Intake/Output Summary (Last 24 hours) at 05/24/2023 1431 Last data filed at 05/24/2023 1344 Gross per 24 hour  Intake 230 ml  Output 0 ml  Net 230 ml   Filed Weights   05/22/23 1143 05/24/23 0428 05/24/23 0736  Weight: 78.1 kg 79 kg 79 kg    Examination:   General: Appearance:    Well developed, well nourished male in no acute distress     Lungs:     respirations unlabored  Heart:    Normal heart rate.       Neurologic:   Awake, alert- ? Confused at times?       Data Reviewed: I have personally reviewed following labs and imaging studies  CBC: Recent Labs  Lab 05/20/23 2224 05/21/23 0804 05/21/23 1734 05/22/23 0810 05/23/23 0220 05/24/23 0737  WBC 13.8* 13.7*  --  14.4* 14.5* 19.6*  NEUTROABS 12.0*  --   --  13.2*  --   --   HGB 10.5* 9.7* 9.5* 9.5* 10.1* 9.8*  HCT 32.8* 29.5* 29.5* 29.8* 30.7* 30.3*  MCV 95.6 93.7  --  94.3 94.8 93.8  PLT 162 167  --  196 202 239   Basic Metabolic Panel: Recent Labs  Lab 05/20/23 2224 05/21/23 0804 05/21/23 1734 05/22/23 0809 05/23/23 0220 05/24/23 0737  NA 139 135  --  133* 133* 133*  K 2.9* 3.1*  --  3.4* 3.2* 3.5  CL 91* 94*  --  91* 92* 92*  CO2 31 26  --  23 25 25   GLUCOSE 124* 137*  --  123* 174* 158*  BUN 23 30*  --  43* 32* 48*  CREATININE 4.75* 5.27*  --  6.80* 5.11* 6.57*  CALCIUM 8.4*  8.0*  8.0*  --  8.5* 8.3* 8.7*  MG  --  2.0  --   --   --   --   PHOS  --   --  2.6 3.2  --  2.8   GFR: Estimated Creatinine Clearance: 11.2 mL/min (A) (by C-G formula based on SCr of 6.57 mg/dL (H)). Liver Function Tests: Recent Labs  Lab 05/20/23 2224 05/21/23 0804 05/22/23 0809 05/24/23 0737  AST 20 19  --   --   ALT 10 10  --   --   ALKPHOS 64 61  --   --   BILITOT 0.8 0.5  --   --   PROT 6.9 6.4*  --   --   ALBUMIN 2.7* 2.5*  2.3* 2.1*   No results for input(s): "LIPASE", "AMYLASE" in the last 168 hours. No results for input(s): "AMMONIA" in the last 168 hours. Coagulation Profile: Recent Labs  Lab 05/20/23 2224  INR 1.4*   Cardiac Enzymes: No results for input(s): "CKTOTAL", "CKMB", "CKMBINDEX", "TROPONINI" in the last 168 hours. BNP (last 3 results) No results for input(s): "PROBNP" in the last 8760 hours. HbA1C: No results for input(s): "HGBA1C" in the last 72 hours. CBG: Recent Labs  Lab 05/23/23 1125 05/23/23 1643 05/23/23 2045 05/24/23 0719 05/24/23 1312  GLUCAP 167* 148* 174* 191* 185*   Lipid Profile: No results for input(s): "CHOL", "HDL", "LDLCALC", "TRIG", "CHOLHDL", "LDLDIRECT" in the last 72 hours. Thyroid Function Tests: No results for input(s): "TSH", "T4TOTAL", "FREET4", "T3FREE", "THYROIDAB" in the last 72 hours. Anemia Panel: No results for input(s): "VITAMINB12", "FOLATE", "FERRITIN", "TIBC", "IRON", "RETICCTPCT" in the last 72 hours.  Sepsis Labs: Recent Labs  Lab 05/20/23 2230 05/21/23 0804 05/24/23 0737  PROCALCITON  --   --  32.43  LATICACIDVEN 1.1 1.1  --     Recent Results (from the past 240 hour(s))  Culture, blood (Routine x 2)     Status: None (Preliminary result)   Collection Time: 05/20/23  9:49 PM   Specimen: BLOOD  Result Value Ref Range Status   Specimen Description BLOOD SITE NOT SPECIFIED  Final   Special Requests   Final    BOTTLES DRAWN AEROBIC AND ANAEROBIC Blood Culture results may not be optimal due to an  inadequate volume of blood received in culture bottles   Culture   Final    NO GROWTH 4 DAYS Performed at [facility], [address] [place], [place] [address]    Report Status PENDING  Incomplete  SARS Coronavirus 2 by RT PCR (hospital order, performed in [facility] hospital lab) *cepheid single result test* Anterior Nasal Swab     Status: None   Collection Time: 05/20/23 10:27 PM   Specimen: Anterior Nasal Swab  Result Value Ref Range Status   SARS Coronavirus 2 by RT PCR NEGATIVE NEGATIVE Final    Comment: Performed at [facility], [address], [place], [place] [address]  Culture, blood (Routine x 2)     Status: None (Preliminary result)   Collection Time: 05/20/23 10:29 PM   Specimen: BLOOD  Result Value Ref Range Status   Specimen Description BLOOD SITE NOT SPECIFIED  Final   Special Requests   Final    BOTTLES DRAWN AEROBIC AND ANAEROBIC Blood Culture adequate volume   Culture   Final    NO GROWTH 4 DAYS Performed at [facility], [address], [place], [place] [address]    Report Status PENDING  Incomplete  Gastrointestinal Panel by PCR , Stool     Status: None   Collection Time: 05/21/23  3:52 AM   Specimen: Stool  Result Value Ref Range Status   Campylobacter species NOT DETECTED NOT DETECTED Final   Plesimonas shigelloides NOT DETECTED NOT DETECTED Final   Salmonella species NOT DETECTED NOT DETECTED Final   Yersinia enterocolitica NOT DETECTED NOT DETECTED Final   Vibrio species NOT DETECTED NOT DETECTED Final   Vibrio cholerae NOT DETECTED NOT DETECTED Final   Enteroaggregative E coli (EAEC) NOT DETECTED NOT DETECTED Final   Enteropathogenic E coli (EPEC) NOT DETECTED NOT DETECTED Final   Enterotoxigenic E coli (ETEC) NOT DETECTED NOT DETECTED Final   Shiga like toxin producing E coli (STEC) NOT DETECTED  NOT DETECTED Final   Shigella/Enteroinvasive E coli (EIEC) NOT DETECTED NOT DETECTED Final   Cryptosporidium NOT DETECTED NOT DETECTED  Final   Cyclospora cayetanensis NOT DETECTED NOT DETECTED Final   Entamoeba histolytica NOT DETECTED NOT DETECTED Final   Giardia lamblia NOT DETECTED NOT DETECTED Final   Adenovirus F40/41 NOT DETECTED NOT DETECTED Final   Astrovirus NOT DETECTED NOT DETECTED Final   Norovirus GI/GII NOT DETECTED NOT DETECTED Final   Rotavirus A NOT DETECTED NOT DETECTED Final   Sapovirus (I, II, IV, and V) NOT DETECTED NOT DETECTED Final    Comment: Performed at [facility], [address] [place], [place] [address]  C Difficile Quick Screen w PCR reflex     Status: None   Collection Time: 05/21/23  3:52 AM   Specimen: Stool  Result Value Ref Range Status   C Diff antigen NEGATIVE NEGATIVE Final   C Diff toxin NEGATIVE NEGATIVE Final   C Diff interpretation No C. difficile detected.  Final    Comment: Performed at [facility], [address] [place], [place] [address]  MRSA Next Gen by PCR, Nasal     Status: None   Collection Time: 05/21/23  3:54 AM   Specimen: Nasal Mucosa; Nasal Swab  Result Value Ref Range Status   MRSA by PCR Next Gen NOT DETECTED NOT DETECTED Final    Comment: (NOTE) The GeneXpert MRSA Assay (FDA approved for NASAL specimens only), is one component of a comprehensive MRSA colonization surveillance program. It is not intended to diagnose MRSA infection nor to guide or monitor treatment for MRSA infections. Test performance is not FDA approved in patients less than 31 years old. Performed at [facility], [address], [place], [place] [address]          Radiology Studies: No results found.      Scheduled Meds:  acidophilus  2 capsule Oral Daily   apixaban  5 mg Oral BID   atorvastatin  80 mg Oral Daily   carvedilol  25 mg Oral BID   Chlorhexidine Gluconate Cloth  6 each Topical Q0600   clopidogrel  75 mg Oral Daily   doxercalciferol  5 mcg Intravenous Q M,W,F-HD   feeding supplement (NEPRO CARB STEADY)  237 mL Oral BID BM    finasteride  5 mg Oral Daily   furosemide  80 mg Oral BID   gabapentin  300 mg Oral BID   insulin aspart  0-6 Units Subcutaneous TID WC   isosorbide mononitrate  60 mg Oral Daily   pantoprazole  40 mg Oral Daily   sevelamer carbonate  800 mg Oral TID WC   sodium chloride flush  3 mL Intravenous Q12H   tamsulosin  0.4 mg Oral QHS   Continuous Infusions:  sodium chloride     cefTRIAXone (ROCEPHIN)  IV 2 g (05/24/23 1330)     LOS: 3 days    Time spent: 45 minutes spent on chart review, discussion with nursing staff, consultants, updating family and interview/physical exam; more than 50% of that time was spent in counseling and/or coordination of care.    Joseph Art, DO [facility] Available via Epic secure chat 7am-7pm After these hours, please refer to coverage provider listed on [email or web address] 05/24/2023, 2:31 PM

## 2023-05-24 NOTE — Progress Notes (Signed)
[facility] Progress Note   Subjective:  Seen on HD - 0 UF today. Febrile again this morning. Says everything hurts, ongoing diarrhea. No dyspnea.  Objective Vitals:   05/24/23 0033 05/24/23 0428 05/24/23 0736 05/24/23 0743  BP: 108/60 (!) 159/63 (!) 156/61 (!) 159/61  Pulse: 67 67 70 71  Resp: 16 18 13 16   Temp: 99 F (37.2 C) 98.8 F (37.1 C) 97.9 F (36.6 C)   TempSrc: Oral Oral    SpO2: 99% 97% 97% 95%  Weight:  79 kg 79 kg   Height:       Physical Exam General: Well appearing man, NAD Heart: RRR; 2/6 murmur Lungs: CTA anteriorly Abdomen: soft Extremities: no LE edema Dialysis Access: LUE AVF + bruit  Additional Objective Labs: Basic Metabolic Panel: Recent Labs  Lab 05/21/23 0804 05/21/23 1734 05/22/23 0809 05/23/23 0220  NA 135  --  133* 133*  K 3.1*  --  3.4* 3.2*  CL 94*  --  91* 92*  CO2 26  --  23 25  GLUCOSE 137*  --  123* 174*  BUN 30*  --  43* 32*  CREATININE 5.27*  --  6.80* 5.11*  CALCIUM 8.0*  8.0*  --  8.5* 8.3*  PHOS  --  2.6 3.2  --    Liver Function Tests: Recent Labs  Lab 05/20/23 2224 05/21/23 0804 05/22/23 0809  AST 20 19  --   ALT 10 10  --   ALKPHOS 64 61  --   BILITOT 0.8 0.5  --   PROT 6.9 6.4*  --   ALBUMIN 2.7* 2.5* 2.3*   CBC: Recent Labs  Lab 05/20/23 2224 05/21/23 0804 05/21/23 1734 05/22/23 0810 05/23/23 0220 05/24/23 0737  WBC 13.8* 13.7*  --  14.4* 14.5* 19.6*  NEUTROABS 12.0*  --   --  13.2*  --   --   HGB 10.5* 9.7*   < > 9.5* 10.1* 9.8*  HCT 32.8* 29.5*   < > 29.8* 30.7* 30.3*  MCV 95.6 93.7  --  94.3 94.8 93.8  PLT 162 167  --  196 202 239   < > = values in this interval not displayed.   Medications:  sodium chloride     cefTRIAXone (ROCEPHIN)  IV 2 g (05/23/23 1011)    apixaban  5 mg Oral BID   atorvastatin  80 mg Oral Daily   carvedilol  25 mg Oral BID   Chlorhexidine Gluconate Cloth  6 each Topical Q0600   clopidogrel  75 mg Oral Daily   doxercalciferol  5 mcg Intravenous  Q M,W,F-HD   feeding supplement (NEPRO CARB STEADY)  237 mL Oral BID BM   finasteride  5 mg Oral Daily   furosemide  80 mg Oral BID   gabapentin  300 mg Oral BID   insulin aspart  0-6 Units Subcutaneous TID WC   isosorbide mononitrate  60 mg Oral Daily   pantoprazole  40 mg Oral Daily   sevelamer carbonate  800 mg Oral TID WC   sodium chloride flush  3 mL Intravenous Q12H   tamsulosin  0.4 mg Oral QHS    Dialysis Orders: MWF at [facility] 4hr, 425/800, EDW 82.5kg, 2K/2Ca bath, AVF, heparin 5000 unit bolus - Last HD 7/22, left below EDW (80.5kg) - dry weight has been lowered multiple times - Hectoral IV q HD - no ESA  Assessment/Plan: 1. Fever + diarrhea: Abd CT without acute findings, C.Diff & GI panel  negative. COVID negative. Hepatitis panel negative.  Essentially normal EGD/colonoscopy in 2020. Blood Cx 7/22 negative. WBC remains high. On empiric ceftriaxone. Per primary. 2. ESRD: Continue HD on MWF schedule - HD today, no UF with HD today as he remains below prior EDW without edema. 3. HTN/volume: BP up slightly, continue home BP meds for now. No edema 4. Anemia: Hgb 9.8 - monitor today, can start ESA if drops any lower. 5. Secondary hyperparathyroidism: Ca/Phos to goal. Continue binders + VDRA. 6. Nutrition: Alb low, on protein supps. 7. Incidental cholelithiasis and lung nodules on imaging -> needs outpatient f/u  Ozzie Hoyle, PA-C 05/24/2023, 8:17 AM  [facility]

## 2023-05-24 NOTE — Evaluation (Signed)
Physical Therapy Evaluation Patient Details Name: William Webb [facility] MRN: [id number] DOB: 06-Nov-1950 Today's Date: 05/24/2023  History of Present Illness  72 y.o. male presents to [facility] hospital on 05/20/2023 with chills and diarrhea, found to have fever in ED. Pt admitted for infection of unknown origin. PMH includes ESRD, PAF, HTN, HLD, CAD, PAD, DMII.  Clinical Impression  Pt presents to PT with deficits in functional mobility, gait, balance, strength, power, endurance, cognition. Pt requires significant assistance to perform bed mobility and to stand once on eval. Pt demonstrates a persistent posterior lean, which he is unable to correct without significant physical assistance. Pt also demonstrates slowed processing, with limited insight into current deficits. Pt is at a high risk for falls at this time, with limited caregiver support at home. PT recommends short term inpatient PT services at the time of discharge.        Assistance Recommended at Discharge Frequent or constant Supervision/Assistance  If plan is discharge home, recommend the following:  Can travel by private vehicle  A lot of help with walking and/or transfers;A lot of help with bathing/dressing/bathroom;Assistance with cooking/housework;Direct supervision/assist for medications management;Direct supervision/assist for financial management;Assist for transportation;Help with stairs or ramp for entrance   No    Equipment Recommendations Wheelchair (measurements PT);Wheelchair cushion (measurements PT)  Recommendations for Other Services       Functional Status Assessment Patient has had a recent decline in their functional status and demonstrates the ability to make significant improvements in function in a reasonable and predictable amount of time.     Precautions / Restrictions Precautions Precautions: Fall Restrictions Weight Bearing Restrictions: No      Mobility  Bed Mobility Overal bed mobility: Needs  Assistance Bed Mobility: Supine to Sit, Sit to Supine     Supine to sit: Max assist Sit to supine: Max assist        Transfers Overall transfer level: Needs assistance Equipment used: 1 person hand held assist Transfers: Sit to/from Stand Sit to Stand: Mod assist, From elevated surface                Ambulation/Gait                  Stairs            Wheelchair Mobility     Tilt Bed    Modified Rankin (Stroke Patients Only)       Balance Overall balance assessment: Needs assistance Sitting-balance support: Single extremity supported, Feet supported Sitting balance-Leahy Scale: Poor Sitting balance - Comments: modA Postural control: Posterior lean Standing balance support: Bilateral upper extremity supported Standing balance-Leahy Scale: Poor Standing balance comment: modA                             Pertinent Vitals/Pain Pain Assessment Pain Assessment: No/denies pain    Home Living Family/patient expects to be discharged to:: Private residence Living Arrangements: Alone Available Help at Discharge: Family;Available PRN/intermittently Type of Home: Apartment Home Access: Level entry       Home Layout: One level Home Equipment: Agricultural consultant (2 wheels)      Prior Function Prior Level of Function : Independent/Modified Independent;Driving                     Hand Dominance        Extremity/Trunk Assessment   Upper Extremity Assessment Upper Extremity Assessment: Generalized weakness    Lower Extremity Assessment Lower  Extremity Assessment: Generalized weakness    Cervical / Trunk Assessment Cervical / Trunk Assessment: Kyphotic  Communication   Communication: No difficulties  Cognition Arousal/Alertness: Awake/alert Behavior During Therapy: WFL for tasks assessed/performed Overall Cognitive Status: Impaired/Different from baseline Area of Impairment: Safety/judgement, Awareness, Problem solving                          Safety/Judgement: Decreased awareness of safety, Decreased awareness of deficits Awareness: Intellectual Problem Solving: Slow processing, Decreased initiation, Requires verbal cues          General Comments General comments (skin integrity, edema, etc.): VSS on RA, gown replaced as it was drenched in sweat    Exercises     Assessment/Plan    PT Assessment Patient needs continued PT services  PT Problem List Decreased strength;Decreased activity tolerance;Decreased balance;Decreased mobility;Decreased knowledge of use of DME;Decreased safety awareness;Decreased knowledge of precautions       PT Treatment Interventions DME instruction;Gait training;Functional mobility training;Therapeutic activities;Therapeutic exercise;Balance training;Neuromuscular re-education;Patient/family education;Wheelchair mobility training;Cognitive remediation    PT Goals (Current goals can be found in the Care Plan section)  Acute Rehab PT Goals Patient Stated Goal: to improve strength and return to independence PT Goal Formulation: With patient Time For Goal Achievement: 06/07/23 Potential to Achieve Goals: Fair    Frequency Min 3X/week     Co-evaluation               AM-PAC PT "6 Clicks" Mobility  Outcome Measure Help needed turning from your back to your side while in a flat bed without using bedrails?: A Little Help needed moving from lying on your back to sitting on the side of a flat bed without using bedrails?: A Lot Help needed moving to and from a bed to a chair (including a wheelchair)?: A Lot Help needed standing up from a chair using your arms (e.g., wheelchair or bedside chair)?: A Lot Help needed to walk in hospital room?: Total Help needed climbing 3-5 steps with a railing? : Total 6 Click Score: 11    End of Session   Activity Tolerance: Patient limited by fatigue Patient left: in bed;with call bell/phone within reach;with bed alarm  set Nurse Communication: Mobility status;Need for lift equipment PT Visit Diagnosis: Other abnormalities of gait and mobility (R26.89);Muscle weakness (generalized) (M62.81)    Time: 1610-9604 PT Time Calculation (min) (ACUTE ONLY): 27 min   Charges:   PT Evaluation $PT Eval Low Complexity: 1 Low   PT General Charges $$ ACUTE PT VISIT: 1 Visit         Arlyss Gandy, PT, DPT Acute Rehabilitation Office [phone]   Arlyss Gandy 05/24/2023, 3:42 PM

## 2023-05-24 NOTE — Progress Notes (Signed)
Received patient in bed. Awake,alert and oriented x 4.Consent verified.  Access used :Left upper arm AVF that worked well.  Medicines given: Heparin 3,000 unit pre-run.                             Hectorol  5 mcg.                             Tylenol 650 mg.  Duration of treatment: 4 hours.  Fluid removed: Even.  Hemo comment.Tolerated treatment well.  Hand off to the patient's nurse.

## 2023-05-24 NOTE — Progress Notes (Signed)
   05/23/23 2036  Assess: MEWS Score  Temp (!) 102.5 F (39.2 C)  BP (!) 129/56  MAP (mmHg) 74  Pulse Rate 74  ECG Heart Rate 75  Resp 16  SpO2 98 %  O2 Device Room Air  Assess: MEWS Score  MEWS Temp 2  MEWS Systolic 0  MEWS Pulse 0  MEWS RR 0  MEWS LOC 0  MEWS Score 2  MEWS Score Color Yellow  Assess: if the MEWS score is Yellow or Red  Were vital signs accurate and taken at a resting state? Yes  Does the patient meet 2 or more of the SIRS criteria? No  MEWS guidelines implemented  No, previously yellow, continue vital signs every 4 hours  Notify: Charge Nurse/RN  Name of Charge Nurse/RN Notified [employer]  Provider Name/Title D. Crosley,MD  Date Provider Notified 05/23/23  Time Provider Notified 2046  Method of Notification Page  Notification Reason Critical Result (Fever 102.7,Yellow MEWS)  Provider response No new orders  Assess: SIRS CRITERIA  SIRS Temperature  1  SIRS Pulse 0  SIRS Respirations  0  SIRS WBC 0  SIRS Score Sum  1

## 2023-05-25 ENCOUNTER — Inpatient Hospital Stay (HOSPITAL_COMMUNITY): Payer: Medicare Other

## 2023-05-25 DIAGNOSIS — B999 Unspecified infectious disease: Secondary | ICD-10-CM | POA: Diagnosis not present

## 2023-05-25 LAB — BASIC METABOLIC PANEL
Anion gap: 20 — ABNORMAL HIGH (ref 5–15)
BUN: 31 mg/dL — ABNORMAL HIGH (ref 8–23)
CO2: 26 mmol/L (ref 22–32)
Calcium: 9 mg/dL (ref 8.9–10.3)
Chloride: 92 mmol/L — ABNORMAL LOW (ref 98–111)
Creatinine, Ser: 4.94 mg/dL — ABNORMAL HIGH (ref 0.61–1.24)
GFR, Estimated: 12 mL/min — ABNORMAL LOW (ref >60)
Glucose, Bld: 146 mg/dL — ABNORMAL HIGH (ref 70–99)
Potassium: 3.5 mmol/L (ref 3.5–5.1)
Sodium: 138 mmol/L (ref 135–145)

## 2023-05-25 LAB — GLUCOSE, CAPILLARY
Glucose-Capillary: 108 mg/dL — ABNORMAL HIGH (ref 70–99)
Glucose-Capillary: 150 mg/dL — ABNORMAL HIGH (ref 70–99)
Glucose-Capillary: 127 mg/dL — ABNORMAL HIGH (ref 70–99)
Glucose-Capillary: 132 mg/dL — ABNORMAL HIGH (ref 70–99)

## 2023-05-25 LAB — CBC
HCT: 32.8 % — ABNORMAL LOW (ref 39.0–52.0)
Hemoglobin: 10.4 g/dL — ABNORMAL LOW (ref 13.0–17.0)
MCH: 31 pg (ref 26.0–34.0)
MCHC: 31.7 g/dL (ref 30.0–36.0)
MCV: 97.6 fL (ref 80.0–100.0)
Platelets: 243 10*3/uL (ref 150–400)
RBC: 3.36 MIL/uL — ABNORMAL LOW (ref 4.22–5.81)
RDW: 15 % (ref 11.5–15.5)
WBC: 20.8 10*3/uL — ABNORMAL HIGH (ref 4.0–10.5)
nRBC: 0 % (ref 0.0–0.2)

## 2023-05-25 LAB — DIFFERENTIAL
Abs Immature Granulocytes: 0.3 10*3/uL — ABNORMAL HIGH (ref 0.00–0.07)
Basophils Absolute: 0.1 10*3/uL (ref 0.0–0.1)
Basophils Relative: 0 %
Eosinophils Absolute: 0.2 10*3/uL (ref 0.0–0.5)
Eosinophils Relative: 1 %
Immature Granulocytes: 1 %
Lymphocytes Relative: 3 %
Lymphs Abs: 0.7 10*3/uL (ref 0.7–4.0)
Monocytes Absolute: 0.9 10*3/uL (ref 0.1–1.0)
Monocytes Relative: 5 %
Neutro Abs: 18.6 10*3/uL — ABNORMAL HIGH (ref 1.7–7.7)
Neutrophils Relative %: 90 %

## 2023-05-25 LAB — TECHNOLOGIST SMEAR REVIEW

## 2023-05-25 MED ORDER — SODIUM CHLORIDE 0.9 % IV SOLN
500.0000 mg | INTRAVENOUS | Status: DC
  Administered 2023-05-25 – 2023-05-26 (×2): 500 mg via INTRAVENOUS
  Filled 2023-05-25 (×2): qty 5

## 2023-05-25 NOTE — Progress Notes (Signed)
PROGRESS NOTE    [facility]  GEX:[id number] DOB: 12/30/1950 DOA: 05/20/2023 PCP: Irven Coe, MD    Brief Narrative:  [facility] is a 72 y.o. male with medical history significant of ESRD on HD MWF, PAF on Eliquis, HTN, HLD, CAD, PAD, insulin-dependent DM-2 and internal hemorrhoids presented to emergency department as he developed some chills and 1 episode of loose stool at home.  Patient reported he noticed some chills since last 24 hours and he has 1 episode of loose stool on Sunday night.      Assessment and Plan: Infection of unknown origin Elevated temperature Leukocytosis Loose stool-1 episode -Patient coming with complaining of chills, 1 episode of loose stool on Sunday and at presentation to ED found temperature spike to 103.  WBC slightly elevated 13.1. Denies any malaise, fatigue, rash, ear/discharge, sinus pain/congestion, photophobia, eye pain/discharge/redness, chest pain, palpitation, orthopnea, leg swelling, cough, sputum production, shortness of breath, wheezing, vomiting, abdominal pain, constipation, increased urinary urgency/dysuria, back pain, neck pain, joint pain, headache, dizziness, change of appetite and weight.  Physical exam unremarkable to identify any source of infection.  Left-sided AV fistula site looks healthy without any sign of infection. -Negative COVID x2, NP swab negative - Chest x-ray unremarkable no acute disease process initially, have repeated- ? Infiltrate-- added azithromycin for now-- monitor Qtc - Patient reported loose stool and no recent antibiotic use.  CT abdomen obtained did not showed any acute intra-abdominal process and cholelithiasis. -no urine for urinalysis  - blood culture x 2- NGTD -Given patient is immunocompromised in the setting of  ESRD planning to continue empiric antibiotic coverage with ceftriaxone 2 g daily.  Will follow-up with blood and urine culture result for appropriate antibiotic guidance -Continue  to monitor CBC and fever. --no sign of blood clots and on eliquis -may need ID consult if no source found   Loose stool-1 episode - No recent antibiotic use. - C. difficile screen with PCR and GI panel -negative -added lactobacillus    Asymptomatic cholelithiasis - CT abdomen and pelvis showed incidental finding of cholelithiasis.  Patient denies any abdominal pain and right upper quadrant pain. -Stable hepatic panel. - Continue to monitor   Hypokalemia - replete   Incidental finding of lung nodule - CT abdomen pelvis showed incidental initial finding of solid nodules right lower and right middle lobe.  Recommended noncontrast chest CT in 3 to 6 months for follow-up. - Patient needs outpatient follow-up with PCP for repeat chest CT in 3-6 months   ESRD on HD MWF  - Patient is euvolemic on physical exam - Compliant with dialysis.  Last dialysis 7/24 -renal consulted   Essential hypertension -resume home regimen   Paroxysmal atrial fibrillation -EKG showed normal sinus rhythm and 3 PVC.  Patient reported not taking Coreg at home.  Patient reported running up to a few medications but he is continuing taking Eliquis. --Patient reported noticing streak of in the stool.  He has history of chronic internal hemorrhoid.  Hemoglobin stable around 10.  It is safe to resume Eliquis as of now. - Continue Eliquis 5 mg twice daily and resuming Coreg 25 mg twice daily -Continue to monitor hemoglobin and development of any acute GI blood loss   History of CAD History of PAD Hyperlipidemia. - not on plavix per patient, statin and Coreg 25 mg twice daily   Internal hemorrhoid - Patient reported noticing some staker blood on the stool which is on and off.  Hemoglobin 10.5 around his  baseline. - Continue to monitor for development of any overt GI bleed   Insulin dependent DM type II-well-controlled Per chart review most recent A1c 6 in May 2024 - SSI    Peripheral neuropathy - home  gabapentin 300 mg twice daily   BPH - Flomax and Proscar.   GERD - Protonix 40 mg daily  OOB  DVT prophylaxis: SCDs Start: 05/21/23 0155 apixaban (ELIQUIS) tablet 5 mg    Code Status: Full Code  Disposition Plan:  Level of care: Telemetry Medical Status is: Inpatient Remains inpatient appropriate     Consultants:  renal    Subjective: C/o aching all over  Objective: Vitals:   05/24/23 2046 05/25/23 0256 05/25/23 0403 05/25/23 0903  BP: (!) 157/55 (!) 166/55 (!) 152/57 (!) 161/58  Pulse: 74 72 72 72  Resp: 16 16 18 18   Temp: (!) 101.6 F (38.7 C) (!) 101.3 F (38.5 C) 99.4 F (37.4 C) 98.4 F (36.9 C)  TempSrc: Oral Oral Oral   SpO2: 95% 95% 98% 99%  Weight:      Height:        Intake/Output Summary (Last 24 hours) at 05/25/2023 1324 Last data filed at 05/25/2023 0600 Gross per 24 hour  Intake 0 ml  Output 0 ml  Net 0 ml   Filed Weights   05/22/23 1143 05/24/23 0428 05/24/23 0736  Weight: 78.1 kg 79 kg 79 kg    Examination:   General: Appearance:    Well developed, well nourished male in no acute distress     Lungs:     respirations unlabored  Heart:    Normal heart rate.       Neurologic:   Awake, alert- ? Confused at times?       Data Reviewed: I have personally reviewed following labs and imaging studies  CBC: Recent Labs  Lab 05/20/23 2224 05/21/23 0804 05/21/23 1734 05/22/23 0810 05/23/23 0220 05/24/23 0737 05/25/23 1111  WBC 13.8* 13.7*  --  14.4* 14.5* 19.6* 20.8*  NEUTROABS 12.0*  --   --  13.2*  --   --  18.6*  HGB 10.5* 9.7* 9.5* 9.5* 10.1* 9.8* 10.4*  HCT 32.8* 29.5* 29.5* 29.8* 30.7* 30.3* 32.8*  MCV 95.6 93.7  --  94.3 94.8 93.8 97.6  PLT 162 167  --  196 202 239 243   Basic Metabolic Panel: Recent Labs  Lab 05/21/23 0804 05/21/23 1734 05/22/23 0809 05/23/23 0220 05/24/23 0737 05/25/23 1111  NA 135  --  133* 133* 133* 138  K 3.1*  --  3.4* 3.2* 3.5 3.5  CL 94*  --  91* 92* 92* 92*  CO2 26  --  23 25 25 26    GLUCOSE 137*  --  123* 174* 158* 146*  BUN 30*  --  43* 32* 48* 31*  CREATININE 5.27*  --  6.80* 5.11* 6.57* 4.94*  CALCIUM 8.0*  8.0*  --  8.5* 8.3* 8.7* 9.0  MG 2.0  --   --   --   --   --   PHOS  --  2.6 3.2  --  2.8  --    GFR: Estimated Creatinine Clearance: 14.9 mL/min (A) (by C-G formula based on SCr of 4.94 mg/dL (H)). Liver Function Tests: Recent Labs  Lab 05/20/23 2224 05/21/23 0804 05/22/23 0809 05/24/23 0737  AST 20 19  --   --   ALT 10 10  --   --   ALKPHOS 64 61  --   --  BILITOT 0.8 0.5  --   --   PROT 6.9 6.4*  --   --   ALBUMIN 2.7* 2.5* 2.3* 2.1*   No results for input(s): "LIPASE", "AMYLASE" in the last 168 hours. No results for input(s): "AMMONIA" in the last 168 hours. Coagulation Profile: Recent Labs  Lab 05/20/23 2224  INR 1.4*   Cardiac Enzymes: No results for input(s): "CKTOTAL", "CKMB", "CKMBINDEX", "TROPONINI" in the last 168 hours. BNP (last 3 results) No results for input(s): "PROBNP" in the last 8760 hours. HbA1C: No results for input(s): "HGBA1C" in the last 72 hours. CBG: Recent Labs  Lab 05/24/23 0719 05/24/23 1312 05/24/23 1637 05/25/23 0726 05/25/23 1204  GLUCAP 191* 185* 150* 108* 150*   Lipid Profile: No results for input(s): "CHOL", "HDL", "LDLCALC", "TRIG", "CHOLHDL", "LDLDIRECT" in the last 72 hours. Thyroid Function Tests: No results for input(s): "TSH", "T4TOTAL", "FREET4", "T3FREE", "THYROIDAB" in the last 72 hours. Anemia Panel: No results for input(s): "VITAMINB12", "FOLATE", "FERRITIN", "TIBC", "IRON", "RETICCTPCT" in the last 72 hours.  Sepsis Labs: Recent Labs  Lab 05/20/23 2230 05/21/23 0804 05/24/23 0737  PROCALCITON  --   --  32.43  LATICACIDVEN 1.1 1.1  --     Recent Results (from the past 240 hour(s))  Culture, blood (Routine x 2)     Status: None   Collection Time: 05/20/23  9:49 PM   Specimen: BLOOD  Result Value Ref Range Status   Specimen Description BLOOD SITE NOT SPECIFIED  Final    Special Requests   Final    BOTTLES DRAWN AEROBIC AND ANAEROBIC Blood Culture results may not be optimal due to an inadequate volume of blood received in culture bottles   Culture   Final    NO GROWTH 5 DAYS Performed at [facility], [address], [place], [place] [address]    Report Status 05/25/2023 FINAL  Final  SARS Coronavirus 2 by RT PCR (hospital order, performed in [facility] hospital lab) *cepheid single result test* Anterior Nasal Swab     Status: None   Collection Time: 05/20/23 10:27 PM   Specimen: Anterior Nasal Swab  Result Value Ref Range Status   SARS Coronavirus 2 by RT PCR NEGATIVE NEGATIVE Final    Comment: Performed at [facility], [address] [place], [place] [address]  Culture, blood (Routine x 2)     Status: None   Collection Time: 05/20/23 10:29 PM   Specimen: BLOOD  Result Value Ref Range Status   Specimen Description BLOOD SITE NOT SPECIFIED  Final   Special Requests   Final    BOTTLES DRAWN AEROBIC AND ANAEROBIC Blood Culture adequate volume   Culture   Final    NO GROWTH 5 DAYS Performed at [facility], [address], [place], [place] [address]    Report Status 05/25/2023 FINAL  Final  Gastrointestinal Panel by PCR , Stool     Status: None   Collection Time: 05/21/23  3:52 AM   Specimen: Stool  Result Value Ref Range Status   Campylobacter species NOT DETECTED NOT DETECTED Final   Plesimonas shigelloides NOT DETECTED NOT DETECTED Final   Salmonella species NOT DETECTED NOT DETECTED Final   Yersinia enterocolitica NOT DETECTED NOT DETECTED Final   Vibrio species NOT DETECTED NOT DETECTED Final   Vibrio cholerae NOT DETECTED NOT DETECTED Final   Enteroaggregative E coli (EAEC) NOT DETECTED NOT DETECTED Final   Enteropathogenic E coli (EPEC) NOT DETECTED NOT DETECTED Final  Enterotoxigenic E coli (ETEC) NOT DETECTED NOT DETECTED Final   Shiga like toxin producing E coli (STEC) NOT DETECTED NOT DETECTED Final    Shigella/Enteroinvasive E coli (EIEC) NOT DETECTED NOT DETECTED Final   Cryptosporidium NOT DETECTED NOT DETECTED Final   Cyclospora cayetanensis NOT DETECTED NOT DETECTED Final   Entamoeba histolytica NOT DETECTED NOT DETECTED Final   Giardia lamblia NOT DETECTED NOT DETECTED Final   Adenovirus F40/41 NOT DETECTED NOT DETECTED Final   Astrovirus NOT DETECTED NOT DETECTED Final   Norovirus GI/GII NOT DETECTED NOT DETECTED Final   Rotavirus A NOT DETECTED NOT DETECTED Final   Sapovirus (I, II, IV, and V) NOT DETECTED NOT DETECTED Final    Comment: Performed at [facility], [address] [place], [place] [address]  C Difficile Quick Screen w PCR reflex     Status: None   Collection Time: 05/21/23  3:52 AM   Specimen: Stool  Result Value Ref Range Status   C Diff antigen NEGATIVE NEGATIVE Final   C Diff toxin NEGATIVE NEGATIVE Final   C Diff interpretation No C. difficile detected.  Final    Comment: Performed at [facility], [address] [place], [place] [address]  MRSA Next Gen by PCR, Nasal     Status: None   Collection Time: 05/21/23  3:54 AM   Specimen: Nasal Mucosa; Nasal Swab  Result Value Ref Range Status   MRSA by PCR Next Gen NOT DETECTED NOT DETECTED Final    Comment: (NOTE) The GeneXpert MRSA Assay (FDA approved for NASAL specimens only), is one component of a comprehensive MRSA colonization surveillance program. It is not intended to diagnose MRSA infection nor to guide or monitor treatment for MRSA infections. Test performance is not FDA approved in patients less than 31 years old. Performed at [facility], [address], [place], [place] [address]   Respiratory (~20 pathogens) panel by PCR     Status: None   Collection Time: 05/24/23  4:10 PM   Specimen: Nasopharyngeal Swab; Respiratory  Result Value Ref Range Status   Adenovirus NOT DETECTED NOT DETECTED Final   Coronavirus 229E NOT DETECTED NOT DETECTED Final    Comment: (NOTE) The  Coronavirus on the Respiratory Panel, DOES NOT test for the novel  Coronavirus (2019 nCoV)    Coronavirus HKU1 NOT DETECTED NOT DETECTED Final   Coronavirus NL63 NOT DETECTED NOT DETECTED Final   Coronavirus OC43 NOT DETECTED NOT DETECTED Final   Metapneumovirus NOT DETECTED NOT DETECTED Final   Rhinovirus / Enterovirus NOT DETECTED NOT DETECTED Final   Influenza A NOT DETECTED NOT DETECTED Final   Influenza B NOT DETECTED NOT DETECTED Final   Parainfluenza Virus 1 NOT DETECTED NOT DETECTED Final   Parainfluenza Virus 2 NOT DETECTED NOT DETECTED Final   Parainfluenza Virus 3 NOT DETECTED NOT DETECTED Final   Parainfluenza Virus 4 NOT DETECTED NOT DETECTED Final   Respiratory Syncytial Virus NOT DETECTED NOT DETECTED Final   Bordetella pertussis NOT DETECTED NOT DETECTED Final   Bordetella Parapertussis NOT DETECTED NOT DETECTED Final   Chlamydophila pneumoniae NOT DETECTED NOT DETECTED Final   Mycoplasma pneumoniae NOT DETECTED NOT DETECTED Final    Comment: Performed at [facility], [address], [place], [place] [address]  SARS Coronavirus 2 by RT PCR (hospital order, performed in [facility] hospital lab) *cepheid single result test* Anterior Nasal Swab     Status: None   Collection Time: 05/24/23  4:10 PM  Specimen: Anterior Nasal Swab  Result Value Ref Range Status   SARS Coronavirus 2 by RT PCR NEGATIVE NEGATIVE Final    Comment: Performed at [facility], [address] [place], [place] [address]         Radiology Studies: No results found.      Scheduled Meds:  acidophilus  2 capsule Oral Daily   apixaban  5 mg Oral BID   atorvastatin  80 mg Oral Daily   carvedilol  25 mg Oral BID   Chlorhexidine Gluconate Cloth  6 each Topical Q0600   doxercalciferol  5 mcg Intravenous Q M,W,F-HD   feeding supplement (NEPRO CARB STEADY)  237 mL Oral BID BM   finasteride  5 mg Oral Daily   furosemide  80 mg Oral BID   gabapentin  300 mg Oral BID   insulin  aspart  0-6 Units Subcutaneous TID WC   isosorbide mononitrate  60 mg Oral Daily   pantoprazole  40 mg Oral Daily   sevelamer carbonate  800 mg Oral TID WC   sodium chloride flush  3 mL Intravenous Q12H   tamsulosin  0.4 mg Oral QHS   Continuous Infusions:  sodium chloride     azithromycin     cefTRIAXone (ROCEPHIN)  IV 2 g (05/25/23 0820)     LOS: 4 days    Time spent: 45 minutes spent on chart review, discussion with nursing staff, consultants, updating family and interview/physical exam; more than 50% of that time was spent in counseling and/or coordination of care.    Joseph Art, DO [facility] Available via Epic secure chat 7am-7pm After these hours, please refer to coverage provider listed on [email or web address] 05/25/2023, 1:24 PM

## 2023-05-25 NOTE — Plan of Care (Signed)
  Problem: Education: Goal: Ability to describe self-care measures that may prevent or decrease complications (Diabetes Survival Skills Education) will improve Outcome: Completed/Met

## 2023-05-25 NOTE — Progress Notes (Signed)
[facility] Progress Note   Subjective:   Seen in room - status unchanged. Ongoing muscle aches, intermittent fevers.   Objective Vitals:   05/24/23 2046 05/25/23 0256 05/25/23 0403 05/25/23 0903  BP: (!) 157/55 (!) 166/55 (!) 152/57 (!) 161/58  Pulse: 74 72 72 72  Resp: 16 16 18 18   Temp: (!) 101.6 F (38.7 C) (!) 101.3 F (38.5 C) 99.4 F (37.4 C) 98.4 F (36.9 C)  TempSrc: Oral Oral Oral   SpO2: 95% 95% 98% 99%  Weight:      Height:       Physical Exam General: Well appearing man, NAD Heart: RRR; 2/6 murmur Lungs: CTA anteriorly Abdomen: soft Extremities: no LE edema Dialysis Access: LUE AVF + bruit  Additional Objective Labs: Basic Metabolic Panel: Recent Labs  Lab 05/21/23 1734 05/22/23 0809 05/23/23 0220 05/24/23 0737  NA  --  133* 133* 133*  K  --  3.4* 3.2* 3.5  CL  --  91* 92* 92*  CO2  --  23 25 25   GLUCOSE  --  123* 174* 158*  BUN  --  43* 32* 48*  CREATININE  --  6.80* 5.11* 6.57*  CALCIUM  --  8.5* 8.3* 8.7*  PHOS 2.6 3.2  --  2.8   Liver Function Tests: Recent Labs  Lab 05/20/23 2224 05/21/23 0804 05/22/23 0809 05/24/23 0737  AST 20 19  --   --   ALT 10 10  --   --   ALKPHOS 64 61  --   --   BILITOT 0.8 0.5  --   --   PROT 6.9 6.4*  --   --   ALBUMIN 2.7* 2.5* 2.3* 2.1*   CBC: Recent Labs  Lab 05/20/23 2224 05/21/23 0804 05/21/23 1734 05/22/23 0810 05/23/23 0220 05/24/23 0737  WBC 13.8* 13.7*  --  14.4* 14.5* 19.6*  NEUTROABS 12.0*  --   --  13.2*  --   --   HGB 10.5* 9.7*   < > 9.5* 10.1* 9.8*  HCT 32.8* 29.5*   < > 29.8* 30.7* 30.3*  MCV 95.6 93.7  --  94.3 94.8 93.8  PLT 162 167  --  196 202 239   < > = values in this interval not displayed.   Medications:  sodium chloride     cefTRIAXone (ROCEPHIN)  IV 2 g (05/25/23 0820)    acidophilus  2 capsule Oral Daily   apixaban  5 mg Oral BID   atorvastatin  80 mg Oral Daily   carvedilol  25 mg Oral BID   Chlorhexidine Gluconate Cloth  6 each Topical  Q0600   clopidogrel  75 mg Oral Daily   doxercalciferol  5 mcg Intravenous Q M,W,F-HD   feeding supplement (NEPRO CARB STEADY)  237 mL Oral BID BM   finasteride  5 mg Oral Daily   furosemide  80 mg Oral BID   gabapentin  300 mg Oral BID   insulin aspart  0-6 Units Subcutaneous TID WC   isosorbide mononitrate  60 mg Oral Daily   pantoprazole  40 mg Oral Daily   sevelamer carbonate  800 mg Oral TID WC   sodium chloride flush  3 mL Intravenous Q12H   tamsulosin  0.4 mg Oral QHS    Dialysis Orders: MWF at [facility] 4hr, 425/800, EDW 82.5kg, 2K/2Ca bath, AVF, heparin 5000 unit bolus - Last HD 7/22, left below EDW (80.5kg) - dry weight has been lowered multiple times - Hectoral  IV q HD - no ESA   Assessment/Plan: 1. Fever/myalgias + diarrhea: Abd CT without acute findings, C.Diff & GI panel negative. COVID negative. Hepatitis panel negative.  Essentially normal EGD/colonoscopy in 2020. Blood Cx 7/22 negative. WBC remains high. On empiric ceftriaxone. Per primary. 2. ESRD: Continue HD on MWF schedule - next HD 7/29. 3. HTN/volume: BP up slightly, continue home BP meds for now. No edema. Will plan low UF with next HD. 4. Anemia: Hgb 9.8 - monitor today, can start ESA if drops any lower. 5. Secondary hyperparathyroidism: Ca/Phos to goal. Continue binders + VDRA. 6. Nutrition: Alb low, on protein supps. 7. Incidental cholelithiasis and lung nodules on imaging -> needs outpatient f/u  Ozzie Hoyle, PA-C 05/25/2023, 9:49 AM  [employer]

## 2023-05-25 NOTE — Progress Notes (Signed)
OT Cancellation Note  Patient Details Name: [name] MRN: [id number] DOB: 1950/12/05   Cancelled Treatment:    Reason Eval/Treat Not Completed: Patient declined, no reason specified Pt requesting to eat lunch at this time. Pt also noted to be soiled, notified RN/NT. Pt declined offer to clean up prior to eating lunch, stated "I'm hungry and ready to eat". OT will return later as time allows and pt is appropriate.   Rebeca Alert 05/25/2023, 11:54 AM

## 2023-05-26 DIAGNOSIS — R5381 Other malaise: Secondary | ICD-10-CM

## 2023-05-26 DIAGNOSIS — M791 Myalgia, unspecified site: Secondary | ICD-10-CM

## 2023-05-26 DIAGNOSIS — R197 Diarrhea, unspecified: Secondary | ICD-10-CM

## 2023-05-26 DIAGNOSIS — B999 Unspecified infectious disease: Secondary | ICD-10-CM | POA: Diagnosis not present

## 2023-05-26 LAB — CBC WITH DIFFERENTIAL/PLATELET
Abs Immature Granulocytes: 0.27 10*3/uL — ABNORMAL HIGH (ref 0.00–0.07)
Basophils Absolute: 0.1 10*3/uL (ref 0.0–0.1)
Basophils Relative: 0 %
Eosinophils Absolute: 0.2 10*3/uL (ref 0.0–0.5)
Eosinophils Relative: 1 %
HCT: 30.9 % — ABNORMAL LOW (ref 39.0–52.0)
Hemoglobin: 9.9 g/dL — ABNORMAL LOW (ref 13.0–17.0)
Immature Granulocytes: 1 %
Lymphocytes Relative: 3 %
Lymphs Abs: 0.6 10*3/uL — ABNORMAL LOW (ref 0.7–4.0)
MCH: 30.7 pg (ref 26.0–34.0)
MCHC: 32 g/dL (ref 30.0–36.0)
MCV: 95.7 fL (ref 80.0–100.0)
Monocytes Absolute: 0.9 10*3/uL (ref 0.1–1.0)
Monocytes Relative: 4 %
Neutro Abs: 19.5 10*3/uL — ABNORMAL HIGH (ref 1.7–7.7)
Neutrophils Relative %: 91 %
Platelets: 243 10*3/uL (ref 150–400)
RBC: 3.23 MIL/uL — ABNORMAL LOW (ref 4.22–5.81)
RDW: 15.1 % (ref 11.5–15.5)
WBC: 21.6 10*3/uL — ABNORMAL HIGH (ref 4.0–10.5)
nRBC: 0 % (ref 0.0–0.2)

## 2023-05-26 LAB — HEPATIC FUNCTION PANEL
ALT: 10 U/L (ref 0–44)
AST: 17 U/L (ref 15–41)
Albumin: 2 g/dL — ABNORMAL LOW (ref 3.5–5.0)
Alkaline Phosphatase: 73 U/L (ref 38–126)
Bilirubin, Direct: <0.1 mg/dL (ref 0.0–0.2)
Total Bilirubin: 0.6 mg/dL (ref 0.3–1.2)
Total Protein: 7 g/dL (ref 6.5–8.1)

## 2023-05-26 LAB — GLUCOSE, CAPILLARY
Glucose-Capillary: 122 mg/dL — ABNORMAL HIGH (ref 70–99)
Glucose-Capillary: 167 mg/dL — ABNORMAL HIGH (ref 70–99)
Glucose-Capillary: 120 mg/dL — ABNORMAL HIGH (ref 70–99)
Glucose-Capillary: 202 mg/dL — ABNORMAL HIGH (ref 70–99)

## 2023-05-26 LAB — TECHNOLOGIST SMEAR REVIEW: Plt Morphology: NORMAL

## 2023-05-26 MED ORDER — LOPERAMIDE HCL 2 MG PO CAPS
4.0000 mg | ORAL_CAPSULE | Freq: Once | ORAL | Status: AC
  Administered 2023-05-26: 4 mg via ORAL

## 2023-05-26 MED ORDER — LOPERAMIDE HCL 2 MG PO CAPS
2.0000 mg | ORAL_CAPSULE | ORAL | Status: DC | PRN
  Administered 2023-05-31 – 2023-06-10 (×3): 2 mg via ORAL
  Filled 2023-05-26 (×5): qty 1

## 2023-05-26 NOTE — Plan of Care (Signed)
  Problem: Education: Goal: Individualized Educational Video(s) Outcome: Not Applicable   
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
## 2023-05-26 NOTE — Evaluation (Signed)
Occupational Therapy Evaluation Patient Details Name: Jamille Jupiter [facility] MRN: [id number] DOB: [birth year] Today's Date: 05/26/2023   History of Present Illness 72 y.o. male presents to [facility] hospital on 05/20/2023 with chills and diarrhea, found to have fever in ED. Pt admitted for infection of unknown origin. PMH includes ESRD, PAF, HTN, HLD, CAD, PAD, DMII.   Clinical Impression   Pt admitted with the above diagnoses and presents with below problem list. Pt will benefit from continued acute OT to address the below listed deficits and maximize independence with basic ADLs prior to d/c to next venue. At baseline, pt is mod I with ADLs using AD. Pt lethargic on OT eval, eyes closed most of session, few verbalizations in response to direct questions. Pt found to have had an episode of bowel incontinence (diarrhea) at start of session. Pt needed +2 assist to roll to each side, total assist for pericare. Pt fatigued at end of cleanup so further assessment of bed mobility (ie EOB) deferred to next session.        Recommendations for follow up therapy are one component of a multi-disciplinary discharge planning process, led by the attending physician.  Recommendations may be updated based on patient status, additional functional criteria and insurance authorization.   Assistance Recommended at Discharge Frequent or constant Supervision/Assistance  Patient can return home with the following Two people to help with walking and/or transfers;Two people to help with bathing/dressing/bathroom    Functional Status Assessment  Patient has had a recent decline in their functional status and demonstrates the ability to make significant improvements in function in a reasonable and predictable amount of time.  Equipment Recommendations  Other (comment) (defer to next venue)    Recommendations for Other Services       Precautions / Restrictions Precautions Precautions: Fall Restrictions Weight Bearing  Restrictions: No      Mobility Bed Mobility Overal bed mobility: Needs Assistance Bed Mobility: Rolling Rolling: Max assist, +2 for physical assistance         General bed mobility comments: +2 assist to fully roll into sidelying position. Multimodal cues given. Unsure if strength or cognitive deficits are increasing level of assist needed. Pt also lethergic throughout session. eyes closed, able to answer basic question with delayed responses noted    Transfers                   General transfer comment: unable to safely attempt this session      Balance                                           ADL either performed or assessed with clinical judgement   ADL Overall ADL's : Needs assistance/impaired Eating/Feeding: Set up Eating/Feeding Details (indicate cue type and reason): able to bring hand to mouth. pt reports decreased appetite. Grooming: Set up;Minimal assistance;Bed level   Upper Body Bathing: Maximal assistance;Bed level   Lower Body Bathing: Total assistance;Bed level;+2 for physical assistance   Upper Body Dressing : Maximal assistance;Bed level   Lower Body Dressing: Total assistance;Bed level;+2 for physical assistance                 General ADL Comments: +2 max-total assist to roll to each side. Fatigued after bed level cleanup so unable to further assess bed mobility/EOB balance.     Vision  Perception     Praxis      Pertinent Vitals/Pain Pain Assessment Pain Assessment: No/denies pain     Hand Dominance     Extremity/Trunk Assessment Upper Extremity Assessment Upper Extremity Assessment: Generalized weakness   Lower Extremity Assessment Lower Extremity Assessment: Generalized weakness;Defer to PT evaluation   Cervical / Trunk Assessment Cervical / Trunk Assessment: Kyphotic   Communication Communication Communication: No difficulties   Cognition Arousal/Alertness: Lethargic Behavior During  Therapy: Flat affect, WFL for tasks assessed/performed Overall Cognitive Status: Difficult to assess                                 General Comments: A&O x4. delayed responses, decreased intitaion, multimodal cues needed. eyes closed most of session, few verbalizations.  Unable to try to sit EOB d/t fatigue after bed level linen change and cleanup d/t recent episode of diarhhea.     General Comments  Grandson present at start of session    Exercises     Shoulder Instructions      Home Living Family/patient expects to be discharged to:: Private residence Living Arrangements: Alone Available Help at Discharge: Family;Available PRN/intermittently Type of Home: Apartment Home Access: Level entry     Home Layout: One level     Bathroom Shower/Tub: Chief Strategy Officer: Standard     Home Equipment: Agricultural consultant (2 wheels)          Prior Functioning/Environment Prior Level of Function : Independent/Modified Independent;Driving                        OT Problem List: Decreased strength;Decreased activity tolerance;Impaired balance (sitting and/or standing);Decreased cognition;Decreased knowledge of use of DME or AE;Decreased knowledge of precautions      OT Treatment/Interventions: Self-care/ADL training;Therapeutic exercise;DME and/or AE instruction;Therapeutic activities;Patient/family education;Balance training;Cognitive remediation/compensation    OT Goals(Current goals can be found in the care plan section) Acute Rehab OT Goals Patient Stated Goal: not stated OT Goal Formulation: With patient Time For Goal Achievement: 06/09/23 Potential to Achieve Goals: Fair ADL Goals Pt Will Perform Grooming: bed level;with modified independence;with set-up Pt Will Perform Upper Body Bathing: with mod assist;bed level Pt Will Perform Lower Body Bathing: with max assist;bed level Pt Will Transfer to Toilet: with max assist;squat pivot  transfer;bedside commode;with +2 assist Additional ADL Goal #1: Pt will complete bed mobility to EOB position with min A +2.  OT Frequency: Min 2X/week    Co-evaluation              AM-PAC OT "6 Clicks" Daily Activity     Outcome Measure Help from another person eating meals?: A Little Help from another person taking care of personal grooming?: A Little Help from another person toileting, which includes using toliet, bedpan, or urinal?: Total Help from another person bathing (including washing, rinsing, drying)?: Total Help from another person to put on and taking off regular upper body clothing?: A Lot Help from another person to put on and taking off regular lower body clothing?: Total 6 Click Score: 11   End of Session    Activity Tolerance: Patient limited by lethargy Patient left: in bed;with call bell/phone within reach;with bed alarm set;with family/visitor present  OT Visit Diagnosis: Unsteadiness on feet (R26.81);Muscle weakness (generalized) (M62.81);Other symptoms and signs involving cognitive function                Time: 7829-5621 OT  Time Calculation (min): 49 min Charges:  OT Evaluation $OT Eval Moderate Complexity: 1 Mod OT Treatments $Self Care/Home Management : 23-37 mins  Raynald Kemp, OT Acute Rehabilitation Services Office: [phone]   Pilar Grammes 05/26/2023, 3:39 PM

## 2023-05-27 DIAGNOSIS — B999 Unspecified infectious disease: Secondary | ICD-10-CM | POA: Diagnosis not present

## 2023-05-27 LAB — GLUCOSE, CAPILLARY
Glucose-Capillary: 151 mg/dL — ABNORMAL HIGH (ref 70–99)
Glucose-Capillary: 221 mg/dL — ABNORMAL HIGH (ref 70–99)

## 2023-05-27 MED ORDER — HEPARIN SODIUM (PORCINE) 1000 UNIT/ML DIALYSIS
2000.0000 [IU] | Freq: Once | INTRAMUSCULAR | Status: AC
  Administered 2023-05-27: 2000 [IU] via INTRAVENOUS_CENTRAL

## 2023-05-27 MED ORDER — HEPARIN SODIUM (PORCINE) 1000 UNIT/ML IJ SOLN: 2000.0000 [IU] | Freq: Once | INTRAMUSCULAR | Status: AC

## 2023-05-27 MED ORDER — ORAL CARE MOUTH RINSE: 15.0000 mL | OROMUCOSAL | Status: DC | PRN

## 2023-05-27 MED ORDER — TEMAZEPAM 7.5 MG PO CAPS
7.5000 mg | ORAL_CAPSULE | Freq: Every day | ORAL | Status: DC
  Administered 2023-05-27 – 2023-05-31 (×5): 7.5 mg via ORAL
  Filled 2023-05-27 (×5): qty 1

## 2023-05-27 NOTE — Progress Notes (Signed)
Received patient in bed to unit.  Alert and oriented.  Informed consent signed and in chart.   TX duration:  Patient tolerated well.  Transported back to the room  Alert, without acute distress.  Hand-off given to patient's nurse.   Access used: LUA Fistula Access issues: none  Total UF removed: Medication(s) given: Hectoral, Hydralazine  Report Given to 3M nurse.  Stacie Glaze LPN [facility]

## 2023-05-27 NOTE — Progress Notes (Signed)
Pt receives out-pt HD at [facility] on MWF. Pt arrives around 10:30 am for 10:50 am chair time. Will assist as needed.   Olivia Canter Renal Navigator [phone]

## 2023-05-27 NOTE — Progress Notes (Signed)
PROGRESS NOTE    [facility]  ZOX:[id number] DOB: 07/18/1951 DOA: 05/20/2023 PCP: William Coe, MD    Brief Narrative:  William Webb is a 72 y.o. male with medical history significant of ESRD on HD MWF, PAF on Eliquis, HTN, HLD, CAD, PAD, insulin-dependent DM-2 and internal hemorrhoids presented to emergency department as he developed some chills and 1 episode of loose stool at home.  Patient reported he noticed some chills since last 24 hours and he has 1 episode of loose stool on Sunday night.      Assessment and Plan: Infection of unknown origin/fever/Leukocytosis/loose stools -Patient coming with complaining of chills, 1 episode of loose stool on Sunday and at presentation to ED found temperature spike to 103.  WBC slightly elevated 13.1. Denies any malaise, fatigue, rash, ear/discharge, sinus pain/congestion, photophobia, eye pain/discharge/redness, chest pain, palpitation, orthopnea, leg swelling, cough, sputum production, shortness of breath, wheezing, vomiting, abdominal pain, constipation, increased urinary urgency/dysuria, back pain, neck pain, joint pain, headache, dizziness, change of appetite and weight.  Physical exam unremarkable to identify any source of infection.  Left-sided AV fistula site looks healthy without any sign of infection. -Negative COVID x2, NP swab negative - Chest x-ray unremarkable no acute disease process initially, have repeated- ? Infiltrate-- added azithromycin for now-- monitor Qtc - Patient reported loose stool and no recent antibiotic use.  CT abdomen obtained did not showed any acute intra-abdominal process and cholelithiasis. -no urine for urinalysis  - blood culture x 2- NGTD -Given patient is immunocompromised in the setting of  ESRD planning to continue empiric antibiotic coverage with ceftriaxone 2 g daily.  Will follow-up with blood and urine culture result for appropriate antibiotic guidance -Continue to monitor CBC and  fever. --no sign of blood clots and on eliquis -consult ID   Loose stool - No recent antibiotic use. - C. difficile screen with PCR and GI panel -negative -added lactobacillus    Asymptomatic cholelithiasis - CT abdomen and pelvis showed incidental finding of cholelithiasis.  Patient denies any abdominal pain and right upper quadrant pain. -Stable hepatic panel. - Continue to monitor   Hypokalemia - replete   Incidental finding of lung nodule - CT abdomen pelvis showed incidental initial finding of solid nodules right lower and right middle lobe.  Recommended noncontrast chest CT in 3 to 6 months for follow-up. - Patient needs outpatient follow-up with PCP for repeat chest CT in 3-6 months   ESRD on HD MWF  - Patient is euvolemic on physical exam - Compliant with dialysis.  Last dialysis 7/24 -renal consulted   Essential hypertension -resume home regimen   Paroxysmal atrial fibrillation -EKG showed normal sinus rhythm and 3 PVC.  Patient reported not taking William Webb at home.  Patient reported running up to a few medications but he is continuing taking Eliquis. --Patient reported noticing streak of in the stool.  He has history of chronic internal hemorrhoid.  Hemoglobin stable around 10.  It is safe to resume Eliquis as of now. - Continue Eliquis 5 mg twice daily and resuming William Webb 25 mg twice daily -Continue to monitor hemoglobin and development of any acute GI blood loss   History of CAD History of PAD Hyperlipidemia. - not on plavix per patient, statin and William Webb 25 mg twice daily   Internal hemorrhoid - Patient reported noticing some staker blood on the stool which is on and off.  Hemoglobin 10.5 around his baseline. - Continue to monitor for development of any overt GI bleed  Insulin dependent DM type II-well-controlled Per chart review most recent A1c 6 in May 2024 - SSI    Peripheral neuropathy - home gabapentin 300 mg twice daily   BPH - Flomax and Proscar.    GERD - Protonix 40 mg daily  OOB  Recent loss of brother- 2 months ago-- sister says he has gone down hill from then -trial of William Webb at night   DVT prophylaxis: SCDs Start: 05/21/23 0155 apixaban (ELIQUIS) tablet 5 mg    Code Status: Full Code  Disposition Plan:  Level of care: Telemetry Medical Status is: Inpatient Remains inpatient appropriate   Called sister William Webb  Consultants:  renal  ID   Subjective: Not eating much Family says 2 recent deaths in the family  Objective: Vitals:   05/27/23 1100 05/27/23 1130 05/27/23 1157 05/27/23 1231  BP: (!) 173/68 (!) 186/72 (!) 184/69 (!) 193/72  Pulse: 71 69 69 73  Resp: 19 17 16 13   Temp: 98.5 F (36.9 C)  98.3 F (36.8 C)   TempSrc: Oral  Oral   SpO2: 98% 97% 100% 97%  Weight:    78.3 kg  Height:        Intake/Output Summary (Last 24 hours) at 05/27/2023 1315 Last data filed at 05/27/2023 1231 Gross per 24 hour  Intake 590 ml  Output 500 ml  Net 90 ml   Filed Weights   05/24/23 0736 05/27/23 0750 05/27/23 1231  Weight: 79 kg 77.6 kg 78.3 kg    Examination:   General: Appearance:    Elderly male in no acute distress     Lungs:     respirations unlabored  Heart:    Normal heart rate.       Neurologic:   Awake, alert- ? Confused at times?       Data Reviewed: I have personally reviewed following labs and imaging studies  CBC: Recent Labs  Lab 05/20/23 2224 05/21/23 0804 05/22/23 0810 05/23/23 0220 05/24/23 0737 05/25/23 1111 05/26/23 1228 05/27/23 0251  WBC 13.8*   < > 14.4* 14.5* 19.6* 20.8* 21.6* 19.3*  NEUTROABS 12.0*  --  13.2*  --   --  18.6* 19.5*  --   HGB 10.5*   < > 9.5* 10.1* 9.8* 10.4* 9.9* 10.0*  HCT 32.8*   < > 29.8* 30.7* 30.3* 32.8* 30.9* 31.6*  MCV 95.6   < > 94.3 94.8 93.8 97.6 95.7 96.3  PLT 162   < > 196 202 239 243 243 254   < > = values in this interval not displayed.   Basic Metabolic Panel: Recent Labs  Lab 05/21/23 0804 05/21/23 1734 05/22/23 0809  05/23/23 0220 05/24/23 0737 05/25/23 1111 05/27/23 0251  NA 135  --  133* 133* 133* 138 133*  K 3.1*  --  3.4* 3.2* 3.5 3.5 3.3*  CL 94*  --  91* 92* 92* 92* 97*  CO2 26  --  23 25 25 26 23   GLUCOSE 137*  --  123* 174* 158* 146* 221*  BUN 30*  --  43* 32* 48* 31* 55*  CREATININE 5.27*  --  6.80* 5.11* 6.57* 4.94* 7.42*  CALCIUM 8.0*  8.0*  --  8.5* 8.3* 8.7* 9.0 8.8*  MG 2.0  --   --   --   --   --   --   PHOS  --  2.6 3.2  --  2.8  --   --    GFR: Estimated Creatinine Clearance: 9.8 mL/min (A) (by  C-G formula based on SCr of 7.42 mg/dL (H)). Liver Function Tests: Recent Labs  Lab 05/20/23 2224 05/21/23 0804 05/22/23 0809 05/24/23 0737 05/26/23 1229 05/27/23 0251  AST 20 19  --   --  17 16  ALT 10 10  --   --  10 10  ALKPHOS 64 61  --   --  73 69  BILITOT 0.8 0.5  --   --  0.6 0.4  PROT 6.9 6.4*  --   --  7.0 6.9  ALBUMIN 2.7* 2.5* 2.3* 2.1* 2.0* 1.9*   No results for input(s): "LIPASE", "AMYLASE" in the last 168 hours. No results for input(s): "AMMONIA" in the last 168 hours. Coagulation Profile: Recent Labs  Lab 05/20/23 2224  INR 1.4*   Cardiac Enzymes: No results for input(s): "CKTOTAL", "CKMB", "CKMBINDEX", "TROPONINI" in the last 168 hours. BNP (last 3 results) No results for input(s): "PROBNP" in the last 8760 hours. HbA1C: No results for input(s): "HGBA1C" in the last 72 hours. CBG: Recent Labs  Lab 05/25/23 2101 05/26/23 0740 05/26/23 1129 05/26/23 1635 05/26/23 2143  GLUCAP 132* 122* 167* 120* 202*   Lipid Profile: No results for input(s): "CHOL", "HDL", "LDLCALC", "TRIG", "CHOLHDL", "LDLDIRECT" in the last 72 hours. Thyroid Function Tests: No results for input(s): "TSH", "T4TOTAL", "FREET4", "T3FREE", "THYROIDAB" in the last 72 hours. Anemia Panel: No results for input(s): "VITAMINB12", "FOLATE", "FERRITIN", "TIBC", "IRON", "RETICCTPCT" in the last 72 hours.  Sepsis Labs: Recent Labs  Lab 05/20/23 2230 05/21/23 0804 05/24/23 0737   PROCALCITON  --   --  32.43  LATICACIDVEN 1.1 1.1  --     Recent Results (from the past 240 hour(s))  Culture, blood (Routine x 2)     Status: None   Collection Time: 05/20/23  9:49 PM   Specimen: BLOOD  Result Value Ref Range Status   Specimen Description BLOOD SITE NOT SPECIFIED  Final   Special Requests   Final    BOTTLES DRAWN AEROBIC AND ANAEROBIC Blood Culture results may not be optimal due to an inadequate volume of blood received in culture bottles   Culture   Final    NO GROWTH 5 DAYS Performed at [facility], [address] [place], [place] [address]    Report Status 05/25/2023 FINAL  Final  SARS Coronavirus 2 by RT PCR (hospital order, performed in [facility] hospital lab) *cepheid single result test* Anterior Nasal Swab     Status: None   Collection Time: 05/20/23 10:27 PM   Specimen: Anterior Nasal Swab  Result Value Ref Range Status   SARS Coronavirus 2 by RT PCR NEGATIVE NEGATIVE Final    Comment: Performed at [facility], [address], [place], [place] [address]  Culture, blood (Routine x 2)     Status: None   Collection Time: 05/20/23 10:29 PM   Specimen: BLOOD  Result Value Ref Range Status   Specimen Description BLOOD SITE NOT SPECIFIED  Final   Special Requests   Final    BOTTLES DRAWN AEROBIC AND ANAEROBIC Blood Culture adequate volume   Culture   Final    NO GROWTH 5 DAYS Performed at [facility], [address] [place], [place] [address]    Report Status 05/25/2023 FINAL  Final  Gastrointestinal Panel by PCR , Stool     Status: None   Collection Time: 05/21/23  3:52 AM   Specimen: Stool  Result Value Ref Range Status   Campylobacter species NOT DETECTED  NOT DETECTED Final   Plesimonas shigelloides NOT DETECTED NOT DETECTED Final   Salmonella species NOT DETECTED NOT DETECTED Final   Yersinia enterocolitica NOT DETECTED NOT DETECTED Final   Vibrio species NOT DETECTED NOT DETECTED Final   Vibrio cholerae NOT DETECTED NOT  DETECTED Final   Enteroaggregative E coli (EAEC) NOT DETECTED NOT DETECTED Final   Enteropathogenic E coli (EPEC) NOT DETECTED NOT DETECTED Final   Enterotoxigenic E coli (ETEC) NOT DETECTED NOT DETECTED Final   Shiga like toxin producing E coli (STEC) NOT DETECTED NOT DETECTED Final   Shigella/Enteroinvasive E coli (EIEC) NOT DETECTED NOT DETECTED Final   Cryptosporidium NOT DETECTED NOT DETECTED Final   Cyclospora cayetanensis NOT DETECTED NOT DETECTED Final   Entamoeba histolytica NOT DETECTED NOT DETECTED Final   Giardia lamblia NOT DETECTED NOT DETECTED Final   Adenovirus F40/41 NOT DETECTED NOT DETECTED Final   Astrovirus NOT DETECTED NOT DETECTED Final   Norovirus GI/GII NOT DETECTED NOT DETECTED Final   Rotavirus A NOT DETECTED NOT DETECTED Final   Sapovirus (I, II, IV, and V) NOT DETECTED NOT DETECTED Final    Comment: Performed at [facility], [address] [place], [place] [address]  C Difficile Quick Screen w PCR reflex     Status: None   Collection Time: 05/21/23  3:52 AM   Specimen: Stool  Result Value Ref Range Status   C Diff antigen NEGATIVE NEGATIVE Final   C Diff toxin NEGATIVE NEGATIVE Final   C Diff interpretation No C. difficile detected.  Final    Comment: Performed at [facility], [address], [place], [place] [address]  MRSA Next Gen by PCR, Nasal     Status: None   Collection Time: 05/21/23  3:54 AM   Specimen: Nasal Mucosa; Nasal Swab  Result Value Ref Range Status   MRSA by PCR Next Gen NOT DETECTED NOT DETECTED Final    Comment: (NOTE) The GeneXpert MRSA Assay (FDA approved for NASAL specimens only), is one component of a comprehensive MRSA colonization surveillance program. It is not intended to diagnose MRSA infection nor to guide or monitor treatment for MRSA infections. Test performance is not FDA approved in patients less than 83 years old. Performed at [facility], [address] [place], [place] [address]    Respiratory (~20 pathogens) panel by PCR     Status: None   Collection Time: 05/24/23  4:10 PM   Specimen: Nasopharyngeal Swab; Respiratory  Result Value Ref Range Status   Adenovirus NOT DETECTED NOT DETECTED Final   Coronavirus 229E NOT DETECTED NOT DETECTED Final    Comment: (NOTE) The Coronavirus on the Respiratory Panel, DOES NOT test for the novel  Coronavirus (2019 nCoV)    Coronavirus HKU1 NOT DETECTED NOT DETECTED Final   Coronavirus NL63 NOT DETECTED NOT DETECTED Final   Coronavirus OC43 NOT DETECTED NOT DETECTED Final   Metapneumovirus NOT DETECTED NOT DETECTED Final   Rhinovirus / Enterovirus NOT DETECTED NOT DETECTED Final   Influenza A NOT DETECTED NOT DETECTED Final   Influenza B NOT DETECTED NOT DETECTED Final   Parainfluenza Virus 1 NOT DETECTED NOT DETECTED Final   Parainfluenza Virus 2 NOT DETECTED NOT DETECTED Final   Parainfluenza Virus 3 NOT DETECTED NOT DETECTED Final   Parainfluenza Virus 4 NOT DETECTED NOT DETECTED Final   Respiratory Syncytial Virus NOT DETECTED NOT DETECTED Final   Bordetella pertussis NOT DETECTED NOT DETECTED Final   Bordetella Parapertussis NOT DETECTED NOT DETECTED  Final   Chlamydophila pneumoniae NOT DETECTED NOT DETECTED Final   Mycoplasma pneumoniae NOT DETECTED NOT DETECTED Final    Comment: Performed at [facility], [address] [place], [place] [address]  SARS Coronavirus 2 by RT PCR (hospital order, performed in [facility] hospital lab) *cepheid single result test* Anterior Nasal Swab     Status: None   Collection Time: 05/24/23  4:10 PM   Specimen: Anterior Nasal Swab  Result Value Ref Range Status   SARS Coronavirus 2 by RT PCR NEGATIVE NEGATIVE Final    Comment: Performed at [facility], [address] [place], [place], [place] [address]         Radiology Studies: No results found.      Scheduled Meds:  acidophilus  2 capsule Oral Daily   apixaban  5 mg Oral BID   atorvastatin  80 mg Oral Daily    carvedilol  25 mg Oral BID   doxercalciferol  5 mcg Intravenous Q M,W,F-HD   feeding supplement (NEPRO CARB STEADY)  237 mL Oral BID BM   finasteride  5 mg Oral Daily   gabapentin  300 mg Oral BID   insulin aspart  0-6 Units Subcutaneous TID WC   isosorbide mononitrate  60 mg Oral Daily   pantoprazole  40 mg Oral Daily   sodium chloride flush  3 mL Intravenous Q12H   tamsulosin  0.4 mg Oral QHS   Continuous Infusions:  sodium chloride       LOS: 6 days    Time spent: 45 minutes spent on chart review, discussion with nursing staff, consultants, updating family and interview/physical exam; more than 50% of that time was spent in counseling and/or coordination of care.    Joseph Art, DO [facility] Available via Epic secure chat 7am-7pm After these hours, please refer to coverage provider listed on [email or web address] 05/27/2023, 1:15 PM
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
## 2023-05-27 NOTE — Progress Notes (Signed)
Physical Therapy Treatment Patient Details Name: William Webb [facility] MRN: [id number] DOB: December 06, 1950 Today's Date: 05/27/2023   History of Present Illness 72 y.o. male presents to [facility] hospital on 05/20/2023 with chills and diarrhea, found to have fever in ED. Pt admitted for infection of unknown origin. PMH includes ESRD, PAF, HTN, HLD, CAD, PAD, DMII.    PT Comments  Pt received in supine after return from HD, lunch tray in front of him but pt states it is too cold and had not eaten from it. Pt eager to participate in transfer training OOB and needing up to +2 maxA for sit<>stand from elevated bed surface with +2 HHA x2 trials, then with [facility] lift x2 reps. Pt with R and posterior lean sitting EOB and in chair and posterior lean with standing trials. Pt continues to benefit from PT services to progress toward functional mobility goals.    If plan is discharge home, recommend the following: A lot of help with bathing/dressing/bathroom;Assistance with cooking/housework;Direct supervision/assist for medications management;Direct supervision/assist for financial management;Assist for transportation;Help with stairs or ramp for entrance;Two people to help with walking and/or transfers   Can travel by private vehicle     No  Equipment Recommendations  Wheelchair (measurements PT);Wheelchair cushion (measurements PT) (consider mechanical lift and hospital bed pending progress)    Recommendations for Other Services       Precautions / Restrictions Precautions Precautions: Fall Precaution Comments: on/off fevers Restrictions Weight Bearing Restrictions: No     Mobility  Bed Mobility Overal bed mobility: Needs Assistance Bed Mobility: Rolling, Sidelying to Sit Rolling: Mod assist Sidelying to sit: Max assist, HOB elevated       General bed mobility comments: Increased time/effort to initiate and perform tasks; dense cues for pt to pull himself forward with bed rail but pt has difficulty  sequencing; ended up needing HHA and maxA for trunk rise to R EOB. Posterior lean upon sitting upright.    Transfers Overall transfer level: Needs assistance Equipment used: 2 person hand held assist, Ambulation equipment used Transfers: Sit to/from Stand, Bed to chair/wheelchair/BSC Sit to Stand: Max assist, +2 physical assistance, From elevated surface           General transfer comment: STS x2 from EOB with +2 HHA and bil feet blocked, however pt impulsive to sit back down after ~1-5 seconds and posterior lean throughout, pt unable to fully extend trunk/hips. PTA brought Stedy to room and pt stands from elevated bed<>Stedy and Stedy seat>chair with +2 maxA. Hoyer lift pad brought to room at end of session in case RN has trouble getting him standing from chair<>Stedy. Transfer via Lift Equipment: Stedy  Ambulation/Gait               General Gait Details: NT; pt unable to weight shift in stance, posterior lean   Stairs             Wheelchair Mobility     Tilt Bed    Modified Rankin (Stroke Patients Only)       Balance Overall balance assessment: Needs assistance Sitting-balance support: Single extremity supported, Feet supported Sitting balance-Leahy Scale: Poor Sitting balance - Comments: heavy R lean sitting in recliner, posterior and R lean sitting EOB needing variable min to maxA while seated   Standing balance support: Bilateral upper extremity supported Standing balance-Leahy Scale: Zero Standing balance comment: +2 maxA today to stand fully upright  Cognition Arousal/Alertness: Awake/alert Behavior During Therapy: Flat affect, WFL for tasks assessed/performed Overall Cognitive Status: Difficult to assess Area of Impairment: Safety/judgement, Awareness, Problem solving                         Safety/Judgement: Decreased awareness of safety, Decreased awareness of deficits Awareness:  Intellectual Problem Solving: Slow processing, Difficulty sequencing, Requires verbal cues General Comments: A&O x4. Multimodal cues needed. Pt eager to get OOB to chair but poor awareness of his body's positioning and safety, needs dense cues throughout.        Exercises Other Exercises Other Exercises: seated BLE AAROM: hip flexion, LAQ x10 reps ea Other Exercises: supine BLE AROM: SAQ, ankle pumps x10 reps ea    General Comments General comments (skin integrity, edema, etc.): VSS per chart review, pt feels somewhat warm but recent temp was [facility]. Pt agreeable to sit up in chair to eat dinner, he did not eat anything from lunch tray.      Pertinent Vitals/Pain Pain Assessment Pain Assessment: No/denies pain     PT Goals (current goals can now be found in the care plan section) Acute Rehab PT Goals Patient Stated Goal: to improve strength and return to independence PT Goal Formulation: With patient Time For Goal Achievement: 06/07/23 Progress towards PT goals: Progressing toward goals    Frequency    Min 1X/week      PT Plan Current plan remains appropriate       AM-PAC PT "6 Clicks" Mobility   Outcome Measure  Help needed turning from your back to your side while in a flat bed without using bedrails?: A Lot Help needed moving from lying on your back to sitting on the side of a flat bed without using bedrails?: A Lot Help needed moving to and from a bed to a chair (including a wheelchair)?: Total Help needed standing up from a chair using your arms (e.g., wheelchair or bedside chair)?: Total Help needed to walk in hospital room?: Total Help needed climbing 3-5 steps with a railing? : Total 6 Click Score: 8    End of Session Equipment Utilized During Treatment: Gait belt Activity Tolerance: Patient limited by fatigue;Patient tolerated treatment well Patient left: in chair;with call bell/phone within reach;with chair alarm set Nurse Communication: Mobility  status;Need for lift equipment;Other (comment) (Stedy with +2 vs hoyer lift for return chair>bed) PT Visit Diagnosis: Other abnormalities of gait and mobility (R26.89);Muscle weakness (generalized) (M62.81)     Time: 1610-9604 PT Time Calculation (min) (ACUTE ONLY): 27 min  Charges:    $Therapeutic Exercise: 8-22 mins $Therapeutic Activity: 8-22 mins PT General Charges $$ ACUTE PT VISIT: 1 Visit                     William Webb P., PTA Acute Rehabilitation Services Secure Chat Preferred 9a-5:30pm Office: [phone]    Bobbie Chai [facility] 05/27/2023, 5:39 PM

## 2023-05-28 ENCOUNTER — Inpatient Hospital Stay (HOSPITAL_COMMUNITY): Payer: Medicare Other

## 2023-05-28 ENCOUNTER — Other Ambulatory Visit (HOSPITAL_COMMUNITY): Payer: Medicare Other

## 2023-05-28 DIAGNOSIS — B999 Unspecified infectious disease: Secondary | ICD-10-CM | POA: Diagnosis not present

## 2023-05-28 DIAGNOSIS — I38 Endocarditis, valve unspecified: Secondary | ICD-10-CM | POA: Diagnosis not present

## 2023-05-28 DIAGNOSIS — R197 Diarrhea, unspecified: Secondary | ICD-10-CM | POA: Diagnosis not present

## 2023-05-28 DIAGNOSIS — M791 Myalgia, unspecified site: Secondary | ICD-10-CM | POA: Diagnosis not present

## 2023-05-28 DIAGNOSIS — L8990 Pressure ulcer of unspecified site, unspecified stage: Secondary | ICD-10-CM | POA: Insufficient documentation

## 2023-05-28 DIAGNOSIS — R5381 Other malaise: Secondary | ICD-10-CM | POA: Diagnosis not present

## 2023-05-28 LAB — ECHOCARDIOGRAM COMPLETE
AR max vel: 1.51 cm2
AV Area VTI: 1.58 cm2
AV Area mean vel: 1.51 cm2
AV Mean grad: 12 mmHg
AV Peak grad: 22.3 mmHg
Ao pk vel: 2.36 m/s
Area-P 1/2: 3.94 cm2
Est EF: 50
Height: 75 in
S' Lateral: 4 cm
Weight: 2761.92 oz

## 2023-05-28 LAB — GLUCOSE, CAPILLARY
Glucose-Capillary: 148 mg/dL — ABNORMAL HIGH (ref 70–99)
Glucose-Capillary: 250 mg/dL — ABNORMAL HIGH (ref 70–99)
Glucose-Capillary: 161 mg/dL — ABNORMAL HIGH (ref 70–99)

## 2023-05-28 MED ORDER — IOHEXOL 9 MG/ML PO SOLN
500.0000 mL | ORAL | Status: AC
  Administered 2023-05-28: 500 mL via ORAL

## 2023-05-28 MED ORDER — POTASSIUM CHLORIDE CRYS ER 20 MEQ PO TBCR
20.0000 meq | EXTENDED_RELEASE_TABLET | Freq: Once | ORAL | Status: AC
  Administered 2023-05-28: 20 meq via ORAL
  Filled 2023-05-28: qty 1

## 2023-05-28 MED ORDER — IOHEXOL 350 MG/ML SOLN
75.0000 mL | Freq: Once | INTRAVENOUS | Status: AC | PRN
  Administered 2023-05-28: 75 mL via INTRAVENOUS

## 2023-05-28 NOTE — [place] FL2 (Signed)
[place] MEDICAID FL2 LEVEL OF CARE FORM     IDENTIFICATION  Patient Name: William Webb [facility] Birthdate: 11/24/1950 Sex: male Admission Date (Current Location): 05/20/2023  [facility] and [place] Number:  Producer, television/film/video and Address:  [facility], [address], [place], [place] [address]      Provider Number: [id number]  Attending Physician Name and Address:  Joseph Art, DO  Relative Name and Phone Number:  Cecille [facility])  [phone]    Current Level of Care: Hospital Recommended Level of Care: Skilled Nursing Facility Prior Approval Number:    Date Approved/Denied:   PASRR Number: [id number]  Discharge Plan: SNF    Current Diagnoses: Patient Active Problem List   Diagnosis Date Noted   High temperature 05/21/2023   Leukocytosis 05/21/2023   History of CAD (coronary artery disease) 05/21/2023   Elevated temperature due to infection 05/21/2023   Lung nodule 05/21/2023   Hemorrhoids 03/22/2023   Internal hemorrhoid 03/22/2023   Bright red blood per rectum 03/21/2023   Prolonged QT interval 03/21/2023   Precordial pain    Critical limb ischemia of right lower extremity (HCC)    Acute osteomyelitis of toe, right (HCC) 03/13/2022   Osteomyelitis (HCC) 03/13/2022   Elevated troponin    SIRS (systemic inflammatory response syndrome) (HCC) 03/12/2022   ESRD (end stage renal disease) (HCC) 10/19/2021   Community acquired pneumonia 02/01/2021   PNA (pneumonia) 02/01/2021   Hypoxia    Pleural effusion on right 12/02/2020   Pericardial effusion 11/02/2020   Loss of consciousness (HCC) 09/16/2020   Syncope and collapse 09/15/2020   CKD (chronic kidney disease), stage IV (HCC) 08/11/2020   Acute kidney injury superimposed on CKD (HCC) 07/31/2020   Hyperlipidemia associated with type 2 diabetes mellitus (HCC) 07/31/2020   Paroxysmal atrial fibrillation (HCC) 07/31/2020   Chronic disease anemia 07/31/2020   Nausea and  vomiting 07/31/2020   Peripheral artery disease (HCC) 03/19/2020   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, limited to breakdown of skin (HCC) 01/08/2020   Status post vascular surgery 12/07/2019   Respiratory failure (HCC) 12/07/2019   Persistent atrial fibrillation (HCC) 07/10/2019   H/O: GI bleed 07/10/2019   CKD (chronic kidney disease) stage 5, GFR less than 15 ml/min (HCC) 06/08/2019   Erectile dysfunction 05/06/2019   Coronary artery disease involving native coronary artery of native heart without angina pectoris 04/05/2019   Acute on chronic respiratory failure with hypoxia (HCC) 12/10/2018   Healthcare-associated pneumonia 12/10/2018   Hemoptysis 12/10/2018   Sepsis (HCC) 12/10/2018   Acute respiratory failure with hypoxia (HCC) 12/10/2018   Malnutrition of moderate degree 12/02/2018   CAD (coronary artery disease) 12/01/2018   Syncope 12/01/2018   DOE (dyspnea on exertion) 11/18/2018   Hypertension associated with diabetes (HCC)    Hyperlipidemia    Diabetes mellitus with renal complications (HCC)    Noncompliance with medication regimen    Chronic pain syndrome 05/29/2018   Insulin dependent type 2 diabetes mellitus (HCC) 04/07/2018   Stage 3a chronic kidney disease (HCC) 03/11/2018   Fall 11/20/2017   AKI (acute kidney injury) (HCC) 11/20/2017   Normocytic normochromic anemia 11/20/2017   Hypoglycemia 11/19/2017   Essential hypertension 11/19/2017    Orientation RESPIRATION BLADDER Height & Weight     Self, Time, Situation, Place  Normal Continent (anuria) Weight: 172 lb 9.9 oz (78.3 kg) (bed) Height:  6\' 3"  (190.5 cm)  BEHAVIORAL SYMPTOMS/MOOD NEUROLOGICAL BOWEL NUTRITION STATUS  Incontinent Diet (see discharge summary)  AMBULATORY STATUS COMMUNICATION OF NEEDS Skin   Total Care Verbally Normal                       Personal Care Assistance Level of Assistance  Bathing, Feeding, Dressing Bathing Assistance: Maximum assistance   Dressing  Assistance: Maximum assistance     Functional Limitations Info  Speech, Hearing, Sight Sight Info: Adequate Hearing Info: Adequate Speech Info: Adequate    SPECIAL CARE FACTORS FREQUENCY  PT (By licensed PT), OT (By licensed OT)     PT Frequency: 5x/ week OT Frequency: 5x/ week            Contractures Contractures Info: Not present    Additional Factors Info  Insulin Sliding Scale, Code Status, Allergies Code Status Info: FULL Allergies Info: No Known Allergies   Insulin Sliding Scale Info: see discharge med list       Current Medications (05/28/2023):  This is the current hospital active medication list Current Facility-Administered Medications  Medication Dose Route Frequency Provider Last Rate Last Admin   0.9 %  sodium chloride infusion  250 mL Intravenous PRN Janalyn Shy, Subrina, MD       acetaminophen (TYLENOL) tablet 650 mg  650 mg Oral Q6H PRN Carollee Herter, DO   650 mg at 05/28/23 0450   acidophilus (RISAQUAD) capsule 2 capsule  2 capsule Oral Daily Marlin Canary U, DO   2 capsule at 05/28/23 1031   apixaban (ELIQUIS) tablet 5 mg  5 mg Oral BID Sundil, Subrina, MD   5 mg at 05/28/23 1032   atorvastatin (LIPITOR) tablet 80 mg  80 mg Oral Daily Sundil, Subrina, MD   80 mg at 05/28/23 1032   carvedilol (COREG) tablet 25 mg  25 mg Oral BID Sundil, Subrina, MD   25 mg at 05/28/23 1032   doxercalciferol (HECTOROL) injection 5 mcg  5 mcg Intravenous Q M,W,F-HD Delano Metz, MD   5 mcg at 05/27/23 1201   feeding supplement (NEPRO CARB STEADY) liquid 237 mL  237 mL Oral BID BM Lenny Pastel, PA-C   237 mL at 05/26/23 1237   finasteride (PROSCAR) tablet 5 mg  5 mg Oral Daily Sundil, Subrina, MD   5 mg at 05/28/23 1032   gabapentin (NEURONTIN) capsule 300 mg  300 mg Oral BID Sundil, Subrina, MD   300 mg at 05/28/23 1032   hydrALAZINE (APRESOLINE) injection 10 mg  10 mg Intravenous Q8H PRN Sundil, Subrina, MD   10 mg at 05/27/23 1239   insulin aspart (novoLOG) injection 0-6  Units  0-6 Units Subcutaneous TID WC Sundil, Subrina, MD   2 Units at 05/28/23 1218   isosorbide mononitrate (IMDUR) 24 hr tablet 60 mg  60 mg Oral Daily Sundil, Subrina, MD   60 mg at 05/28/23 1031   loperamide (IMODIUM) capsule 2 mg  2 mg Oral PRN Gery Pray, MD       Oral care mouth rinse  15 mL Mouth Rinse PRN Marlin Canary U, DO       pantoprazole (PROTONIX) EC tablet 40 mg  40 mg Oral Daily Sundil, Subrina, MD   40 mg at 05/28/23 1032   sodium chloride flush (NS) 0.9 % injection 3 mL  3 mL Intravenous Q12H Sundil, Subrina, MD   3 mL at 05/28/23 1032   sodium chloride flush (NS) 0.9 % injection 3 mL  3 mL Intravenous PRN Tereasa Coop, MD       tamsulosin (  FLOMAX) capsule 0.4 mg  0.4 mg Oral QHS Sundil, Subrina, MD   0.4 mg at 05/27/23 2158   temazepam (RESTORIL) capsule 7.5 mg  7.5 mg Oral QHS Vann, Jessica U, DO   7.5 mg at 05/27/23 2158     Discharge Medications: Please see discharge summary for a list of discharge medications.  Relevant Imaging Results:  Relevant Lab Results:   Additional Information SSN: [id number]; HD: MWF @ [facility] [address], Arrival time 10:30- Chair time 10:50  [employer], LCSW

## 2023-05-28 NOTE — Progress Notes (Signed)
[facility]  Date of Admission:  05/20/2023     Lines:  Lue avg   Abx: 7/22-28 ceftriaxone 7/27-28 azith                                                          Assessment: 72 yo male with below problems admitted for 3-4 days of malaise, fever, diarrhea, so far negative id w/u but continued fever despite abx. Noted uptrending leukocytosis but stable hemodynamics     Reports initial headache, sorethroat which resolved, but malaise/diarrhea/myalgia remains.    His stool here is varying between soft/diarrhea       Sign/symptomatology Initial lft normal; repeat lft 7/28 remains normal Wbc trending up No thrombocytopenia ?diarrhea Myalgia/malaise   ID w/u: 7/22 bcx ngtd 7/26 abd pelv ct 7/23 gi pcr and cdiff screen negative 7/26 respiratory pcr negative     Ddx (not particularly related to patient) Viral syndrome still Non-GI primary bacterial sepsis syndrome with substantial gi sx -- legionella, typhoid, tularemia, qfever, brucella some of the more thought about bacteria outside of tickborne illness, malaria tick born illnesses such as spotted fever or spirochete disease such as leptospirosis   Other fuo consideration is culture negative endocarditis     He really has no risk factor for bacterial syndrome considered. And I suspect this remains a viral process still   Non-id cause considering lymphoma  -------------- 7/30 assessment Ongoing fever since 7/22 admission but improving otherwise. Stool more formed, no headache, improved myalgia, more energetic and better appetite  No sign of worsening off abx  While I still think it's a viral process, would complete f/u w/u and look for cnIE and lymphoma No sign of rheumatologic syndrome No sign of drug fever  7/28 peripheral blood smear unremarkable    Plan: Abd chest pelv ct with contrast today Check ldh level Limited echo for valve evaluation Discussed with Dr  Benjamine Mola   Principal Problem:   Elevated temperature due to infection Active Problems:   Essential hypertension   Insulin dependent type 2 diabetes mellitus (HCC)   Peripheral artery disease (HCC)   Paroxysmal atrial fibrillation (HCC)   Chronic disease anemia   ESRD (end stage renal disease) (HCC)   Internal hemorrhoid   High temperature   Leukocytosis   History of CAD (coronary artery disease)   Lung nodule   No Known Allergies  Scheduled Meds:  acidophilus  2 capsule Oral Daily   apixaban  5 mg Oral BID   atorvastatin  80 mg Oral Daily   carvedilol  25 mg Oral BID   doxercalciferol  5 mcg Intravenous Q M,W,F-HD   feeding supplement (NEPRO CARB STEADY)  237 mL Oral BID BM   finasteride  5 mg Oral Daily   gabapentin  300 mg Oral BID   insulin aspart  0-6 Units Subcutaneous TID WC   isosorbide mononitrate  60 mg Oral Daily   pantoprazole  40 mg Oral Daily   sodium chloride flush  3 mL Intravenous Q12H   tamsulosin  0.4 mg Oral QHS   temazepam  7.5 mg Oral QHS   Continuous Infusions:  sodium chloride     PRN Meds:.sodium chloride, acetaminophen, hydrALAZINE, [COMPLETED] loperamide **FOLLOWED BY** loperamide, mouth rinse, sodium chloride flush  SUBJECTIVE: Feels stronger, better appetite No myalgia/headache No cough No rash No n/v Diarrhea more formed and decreased frequency  Review of Systems: ROS All other ROS was negative, except mentioned above     OBJECTIVE: Vitals:   05/27/23 2115 05/28/23 0448 05/28/23 0614 05/28/23 0923  BP: (!) 166/59 (!) 153/56  (!) 158/60  Pulse: 70 73  68  Resp: 18   18  Temp: 98.4 F (36.9 C) (!) 103.1 F (39.5 C) 98.3 F (36.8 C) 98.6 F (37 C)  TempSrc: Oral Oral Oral   SpO2: 95% 98%  97%  Weight:      Height:       Body mass index is 21.58 kg/m.  Physical Exam General/constitutional: no distress, pleasant, conversant HEENT: Normocephalic, PER, Conj Clear, EOMI, Oropharynx clear Neck supple CV: rrr no  mrg Lungs: clear to auscultation, normal respiratory effort Abd: Soft, Nontender Ext: no edema; lue avg nontender Skin: No Rash Neuro: generalized weakness MSK: no peripheral joint swelling/tenderness/warmth; back spines nontender   Lab Results Lab Results  Component Value Date   WBC 19.3 (H) 05/27/2023   HGB 10.0 (L) 05/27/2023   HCT 31.6 (L) 05/27/2023   MCV 96.3 05/27/2023   PLT 254 05/27/2023    Lab Results  Component Value Date   CREATININE 4.85 (H) 05/28/2023   BUN 30 (H) 05/28/2023   NA 134 (L) 05/28/2023   K 3.3 (L) 05/28/2023   CL 94 (L) 05/28/2023   CO2 25 05/28/2023    Lab Results  Component Value Date   ALT 11 05/28/2023   AST 15 05/28/2023   ALKPHOS 72 05/28/2023   BILITOT 0.5 05/28/2023      Microbiology: Recent Results (from the past 240 hour(s))  Culture, blood (Routine x 2)     Status: None   Collection Time: 05/20/23  9:49 PM   Specimen: BLOOD  Result Value Ref Range Status   Specimen Description BLOOD SITE NOT SPECIFIED  Final   Special Requests   Final    BOTTLES DRAWN AEROBIC AND ANAEROBIC Blood Culture results may not be optimal due to an inadequate volume of blood received in culture bottles   Culture   Final    NO GROWTH 5 DAYS Performed at [facility], [address], [place], [place] [address]    Report Status 05/25/2023 FINAL  Final  SARS Coronavirus 2 by RT PCR (hospital order, performed in [facility] hospital lab) *cepheid single result test* Anterior Nasal Swab     Status: None   Collection Time: 05/20/23 10:27 PM   Specimen: Anterior Nasal Swab  Result Value Ref Range Status   SARS Coronavirus 2 by RT PCR NEGATIVE NEGATIVE Final    Comment: Performed at [facility], [address] [place], [place] [address]  Culture, blood (Routine x 2)     Status: None   Collection Time: 05/20/23 10:29 PM   Specimen: BLOOD  Result Value Ref Range Status   Specimen Description BLOOD SITE NOT SPECIFIED  Final   Special Requests    Final    BOTTLES DRAWN AEROBIC AND ANAEROBIC Blood Culture adequate volume   Culture   Final    NO GROWTH 5 DAYS Performed at [facility], [address], [place], [place] [address]    Report Status 05/25/2023 FINAL  Final  Gastrointestinal Panel by PCR , Stool     Status: None   Collection Time: 05/21/23  3:52 AM   Specimen: Stool  Result Value Ref Range Status   Campylobacter species NOT DETECTED NOT DETECTED Final   Plesimonas shigelloides NOT DETECTED NOT DETECTED Final   Salmonella species NOT DETECTED NOT DETECTED Final   Yersinia enterocolitica NOT DETECTED NOT DETECTED Final   Vibrio species NOT DETECTED NOT DETECTED Final   Vibrio cholerae NOT DETECTED NOT DETECTED Final   Enteroaggregative E coli (EAEC) NOT DETECTED NOT DETECTED Final   Enteropathogenic E coli (EPEC) NOT DETECTED NOT DETECTED Final   Enterotoxigenic E coli (ETEC) NOT DETECTED NOT DETECTED Final   Shiga like toxin producing E coli (STEC) NOT DETECTED NOT DETECTED Final   Shigella/Enteroinvasive E coli (EIEC) NOT DETECTED NOT DETECTED Final   Cryptosporidium NOT DETECTED NOT DETECTED Final   Cyclospora cayetanensis NOT DETECTED NOT DETECTED Final   Entamoeba histolytica NOT DETECTED NOT DETECTED Final   Giardia lamblia NOT DETECTED NOT DETECTED Final   Adenovirus F40/41 NOT DETECTED NOT DETECTED Final   Astrovirus NOT DETECTED NOT DETECTED Final   Norovirus GI/GII NOT DETECTED NOT DETECTED Final   Rotavirus A NOT DETECTED NOT DETECTED Final   Sapovirus (I, II, IV, and V) NOT DETECTED NOT DETECTED Final    Comment: Performed at [facility], [address] [place], [place] [address]  C Difficile Quick Screen w PCR reflex     Status: None   Collection Time: 05/21/23  3:52 AM   Specimen: Stool  Result Value Ref Range Status   C Diff antigen NEGATIVE NEGATIVE Final   C Diff toxin NEGATIVE NEGATIVE Final   C Diff interpretation No C. difficile detected.  Final    Comment: Performed at  [facility], [address] [place], [place] [address]  MRSA Next Gen by PCR, Nasal     Status: None   Collection Time: 05/21/23  3:54 AM   Specimen: Nasal Mucosa; Nasal Swab  Result Value Ref Range Status   MRSA by PCR Next Gen NOT DETECTED NOT DETECTED Final    Comment: (NOTE) The GeneXpert MRSA Assay (FDA approved for NASAL specimens only), is one component of a comprehensive MRSA colonization surveillance program. It is not intended to diagnose MRSA infection nor to guide or monitor treatment for MRSA infections. Test performance is not FDA approved in patients less than 73 years old. Performed at [facility], [address], [place], [place] [address]   Respiratory (~20 pathogens) panel by PCR     Status: None   Collection Time: 05/24/23  4:10 PM   Specimen: Nasopharyngeal Swab; Respiratory  Result Value Ref Range Status   Adenovirus NOT DETECTED NOT DETECTED Final   Coronavirus 229E NOT DETECTED NOT DETECTED Final    Comment: (NOTE) The Coronavirus on the Respiratory Panel, DOES NOT test for the novel  Coronavirus (2019 nCoV)    Coronavirus HKU1 NOT DETECTED NOT DETECTED Final   Coronavirus NL63 NOT DETECTED NOT DETECTED Final   Coronavirus OC43 NOT DETECTED NOT DETECTED Final   Metapneumovirus NOT DETECTED NOT DETECTED Final   Rhinovirus / Enterovirus NOT DETECTED NOT DETECTED Final   Influenza A NOT DETECTED NOT DETECTED Final   Influenza B NOT DETECTED NOT DETECTED Final   Parainfluenza Virus 1 NOT DETECTED NOT DETECTED Final   Parainfluenza Virus 2 NOT DETECTED NOT DETECTED Final   Parainfluenza Virus 3 NOT DETECTED NOT DETECTED Final   Parainfluenza Virus 4 NOT DETECTED NOT DETECTED Final   Respiratory Syncytial Virus NOT DETECTED NOT DETECTED Final   Bordetella pertussis NOT DETECTED NOT  DETECTED Final   Bordetella Parapertussis NOT DETECTED NOT DETECTED Final   Chlamydophila pneumoniae NOT DETECTED NOT DETECTED Final   Mycoplasma pneumoniae NOT  DETECTED NOT DETECTED Final    Comment: Performed at [facility], [address] [place], [place] [address]  SARS Coronavirus 2 by RT PCR (hospital order, performed in [facility] hospital lab) *cepheid single result test* Anterior Nasal Swab     Status: None   Collection Time: 05/24/23  4:10 PM   Specimen: Anterior Nasal Swab  Result Value Ref Range Status   SARS Coronavirus 2 by RT PCR NEGATIVE NEGATIVE Final    Comment: Performed at [facility], [address], [place], [place] [address]     Serology:   Imaging: If present, new imagings (plain films, ct scans, and mri) have been personally visualized and interpreted; radiology reports have been reviewed. Decision making incorporated into the Impression / Recommendations.   7/27 cxr Mild cardiomegaly. No active lung disease.   Raymondo Band, MD [facility] [phone] pager    05/28/2023, 12:49 PM

## 2023-05-28 NOTE — Progress Notes (Signed)
Subjective: Woke up from sleep, said tired ,weak ,  but tolerated dialysis yesterday.  He realizes he needs SNF for rehab.  Thinks diarrhea resolving  Objective Vital signs in last 24 hours: Vitals:   05/27/23 2115 05/28/23 0448 05/28/23 0614 05/28/23 0923  BP: (!) 166/59 (!) 153/56  (!) 158/60  Pulse: 70 73  68  Resp: 18   18  Temp: 98.4 F (36.9 C) (!) 103.1 F (39.5 C) 98.3 F (36.8 C)   TempSrc: Oral Oral Oral   SpO2: 95% 98%  97%  Weight:      Height:       Weight change:   Physical Exam: General: Elderly male in NAD Heart: RRR 2/6 SEM Lungs: CTA anteriorly nonlabored breathing Abdomen: NABS soft NT ND Extremities: No pedal edema Dialysis Access: LUA AVF + bruit   OP Dialysis Orders: MWF at [facility] 4hr, 425/800, EDW 82.5kg, 2K/2Ca bath, AVF, heparin 5000 unit bolus - Last HD 7/22, left below EDW (80.5kg) - dry weight has been lowered multiple times - Hectoral IV q HD - no ESA   Problem/Plan: 1. Fever/myalgias + diarrhea: Abd CT without acute findings, C.Diff & GI panel negative. COVID negative. Hepatitis panel negative.  Essentially normal EGD/colonoscopy in 2020. Blood Cx 7/22 negative. WBC remains high. On empiric ceftriaxone. Still with unclear etiology - per primary who has consulted ID 7/28 - additional studies pending.  2. ESRD: Continue HD on MWF schedule - next HD tomorrow.  Use 4K dialysate with K 3.3.  Recheck labs K 3.3 will give p.o. 20 meq  K supplement  3. HTN/volume: BP up slightly, continue home BP meds for now. No edema. Hold Lasix.  Well below EDW.  UF 0L with HD yesterday and tomorrow.  4. Anemia: Hgb 10s- monitor without ESA for now. 5. Secondary hyperparathyroidism: Ca ok, Phos lower  (3.0 )(not eating well) - off binders. Continue VDRA. 6. Nutrition: Alb low, continue supps as tolerated - although he reports that Nepro often causes diarrhea..  Will change diet to carb  mod healthy heart with fluid restrictions, low phos ,  low k  monitor labs 7.  Paroxysmal A-fib: On Eliquis. 8. Incidental cholelithiasis and lung nodules on imaging -> needs outpatient f/u 9. Dispo: Suspect will need SNF - lives alone.  Patient realizes today   Lenny Pastel, PA-C [facility] Bee[facility] [phone] 05/28/2023,9:39 AM  LOS: 7 days   Labs: Basic Metabolic Panel: Recent Labs  Lab 05/22/23 0809 05/23/23 0220 05/24/23 0737 05/25/23 1111 05/27/23 0251 05/28/23 0029  NA 133*   < > 133* 138 133* 134*  K 3.4*   < > 3.5 3.5 3.3* 3.3*  CL 91*   < > 92* 92* 97* 94*  CO2 23   < > 25 26 23 25   GLUCOSE 123*   < > 158* 146* 221* 199*  BUN 43*   < > 48* 31* 55* 30*  CREATININE 6.80*   < > 6.57* 4.94* 7.42* 4.85*  CALCIUM 8.5*   < > 8.7* 9.0 8.8* 8.6*  PHOS 3.2  --  2.8  --   --  3.0   < > = values in this interval not displayed.   Liver Function Tests: Recent Labs  Lab 05/26/23 1229 05/27/23 0251 05/28/23 0029 05/28/23 0030  AST 17 16  --  15  ALT 10 10  --  11  ALKPHOS 73 69  --  72  BILITOT 0.6 0.4  --  0.5  PROT  7.0 6.9  --  7.2  ALBUMIN 2.0* 1.9* 2.1* 2.1*   No results for input(s): "LIPASE", "AMYLASE" in the last 168 hours. No results for input(s): "AMMONIA" in the last 168 hours. CBC: Recent Labs  Lab 05/22/23 0810 05/23/23 0220 05/24/23 0737 05/25/23 1111 05/26/23 1228 05/27/23 0251  WBC 14.4* 14.5* 19.6* 20.8* 21.6* 19.3*  NEUTROABS 13.2*  --   --  18.6* 19.5*  --   HGB 9.5* 10.1* 9.8* 10.4* 9.9* 10.0*  HCT 29.8* 30.7* 30.3* 32.8* 30.9* 31.6*  MCV 94.3 94.8 93.8 97.6 95.7 96.3  PLT 196 202 239 243 243 254   Cardiac Enzymes: No results for input(s): "CKTOTAL", "CKMB", "CKMBINDEX", "TROPONINI" in the last 168 hours. CBG: Recent Labs  Lab 05/26/23 1635 05/26/23 2143 05/27/23 1329 05/27/23 2115 05/28/23 0743  GLUCAP 120* 202* 151* 221* 148*    Studies/Results: No results found. Medications:  sodium chloride      acidophilus  2 capsule Oral Daily   apixaban  5 mg Oral BID   atorvastatin  80 mg Oral  Daily   carvedilol  25 mg Oral BID   doxercalciferol  5 mcg Intravenous Q M,W,F-HD   feeding supplement (NEPRO CARB STEADY)  237 mL Oral BID BM   finasteride  5 mg Oral Daily   gabapentin  300 mg Oral BID   insulin aspart  0-6 Units Subcutaneous TID WC   isosorbide mononitrate  60 mg Oral Daily   pantoprazole  40 mg Oral Daily   sodium chloride flush  3 mL Intravenous Q12H   tamsulosin  0.4 mg Oral QHS   temazepam  7.5 mg Oral QHS

## 2023-05-28 NOTE — Care Management Important Message (Signed)
Important Message  Patient Details  Name: William Webb MRN: [id number] Date of Birth: 11-Feb-1951   Medicare Important Message Given:  Yes     Rhyan Wolters [place] 05/28/2023, 2:59 PM

## 2023-05-28 NOTE — Progress Notes (Signed)
*  PRELIMINARY RESULTS* Echocardiogram 2D Echocardiogram has been performed.  William Webb 05/28/2023, 2:15 PM

## 2023-05-28 NOTE — TOC Initial Note (Signed)
Transition of Care [facility]) - Initial/Assessment Note    Patient Details  Name: William Webb MRN: [id number] Date of Birth: 04-14-1951  Transition of Care [facility]) CM/SW Contact:    Ralene Bathe, LCSW Phone Number: 05/28/2023, 12:46 PM  Clinical Narrative:                 CSW received consult for possible SNF placement at time of discharge. CSW spoke with patient at bedside.  Patient expressed understanding of PT recommendation and is agreeable to SNF placement at time of discharge. Patient reports preference for [facility] as it is near his family and church. CSW discussed insurance authorization process and provided Medicare SNF ratings list. CSW will send out referrals for review and provide bed offers as available.   Skilled Nursing Rehab Facilities-   [email or web address]   Ratings out of 5 stars (5 the highest)   Name Address  Phone # Quality Care Staffing Health Inspection Overall  [facility] [address], [place] [phone] 2 1 5 4   [facility] [address] [place] [phone] 4 1 3 2   [facility] [address], [place] [phone] 2 1 1 1   [facility] [address] [place] [phone] 4 1 3 2   [facility]  [address], [place] [phone] 3 2 5 5   [facility] [address] [facility] [phone] 2 1 2 1   [facility] [address] [place] [phone] 4 1 2 1   [facility] [address] [place] [phone] 2 4 3 3   [address] ([facility]) [address], [place] [phone] 3 2 2 2   [facility] [address] [place], [place] [phone] 1 2 1 1   [facility] [address], [place] [phone] 3 1 1 1   [facility] [address] [place] [phone] 4 3 4 4   [facility] [address], [place] [phone] 3 4 3 3           [facility] [address] [place] [phone]      [facility] GY[facility], [place] [address], [place] [phone] 1 1 2 1   [facility] [address] [place] [phone] 2 2 4 4   [facility] [address] [phone] 2 1 4 3   [facility] [place] [facility] [address], [place] [phone] 3 3 3 3           [address] [place] (no [facility]) [address], [place] [phone] 4 4 5 5   [facility] (No [employer]) [address] [place] [phone] 2 2 4 4   [facility] [address] [place] [phone] 2 1 2 1   [facility] (No [employer]) [address], [place] [phone] 5 5 5 5   [facility] [address], [facility] [phone] 2 3 5 5   [facility] [address], [place] [phone] 2 1 1 1   [place] [address] [place] [phone] 5 2 5 5   [facility]  [address] [place] [phone] 2 2 2 2   [facility] [address] [place] [phone] 4 2 1 1   [facility] [address] [place], [place] [phone] 2 2 3 3           [facility] [address] [place] [phone] 1 1 1 1   [facility] [address] [place] Clines  [phone] 2 3 3 3   [facility] (No Hum[employer]) [address] [place] [phone] 2 1 3  2  [facility]) [address], [place]an [phone] 1 1 1 1   [facility] [address] [place] [phone] 3 2 5 5   [facility] [place] [address], [place] [phone] 2 1 1 1           [facility] [address] [employer], [place] [phone] 4 4 5 5   [facility])  [address] [place] [phone] 2 1 2 1   [facility]) [address] [place] [phone]  1 4 3   [facility] [address], [place] [phone] 3 5 4 5   [address] [place], [place] [phone] 3 2 2 2   [facility]) [address] [place] [phone] 2 1 3 2        Barriers to Discharge: Continued Medical Work up   Patient Goals and CMS Choice Patient states their goals for this hospitalization and ongoing recovery are:: To return home CMS Medicare.gov Compare Post Acute Care list provided to:: Patient Choice offered to / list presented to : Patient      Expected Discharge Plan and Services   Discharge Planning Services: CM Consult   Living arrangements for the past 2 months: Apartment                                      Prior Living Arrangements/Services Living arrangements for the past 2 months: Apartment Lives with:: Self Patient language and need for interpreter reviewed:: Yes Do you feel safe going back to the place where you live?: Yes      Need for Family Participation in Patient Care: Yes (Comment) Care giver support system in place?: Yes (comment) Current home services: DME (Cane, walker, shower seat.) Criminal Activity/Legal Involvement Pertinent to Current Situation/Hospitalization: No - Comment as needed  Activities of Daily Living Home Assistive Devices/Equipment: CBG Meter, Cane (specify quad or straight), Blood pressure cuff ADL Screening (condition at time of admission) Patient's cognitive ability adequate to safely complete daily activities?: Yes Is the patient deaf or have difficulty hearing?: No Does the patient have difficulty seeing, even when wearing glasses/contacts?: No Does the patient have difficulty concentrating, remembering, or making decisions?: No Patient able to express need for assistance with ADLs?: Yes Does the patient have difficulty dressing or bathing?: No Independently performs ADLs?: Yes (appropriate for developmental age) Does the patient have difficulty walking or climbing stairs?: Yes Weakness of Legs: Both Weakness of Arms/Hands: None  Permission Sought/Granted Permission sought to share information with : Case Manager, Family Supports Permission granted to share  information with : Yes, Verbal Permission Granted              Emotional Assessment Appearance:: Appears stated age Attitude/Demeanor/Rapport: Engaged, Gracious Affect (typically observed): Accepting, Appropriate, Calm, Hopeful, Pleasant Orientation: : Oriented to Self, Oriented to Place, Oriented to  Time, Oriented to Situation Alcohol / Substance Use: Not Applicable Psych Involvement: No (comment)  Admission diagnosis:  Elevated temperature due to infection [B99.9] Fever, unspecified fever cause [R50.9] Diarrhea, unspecified type [R19.7] Patient Active Problem List   Diagnosis Date Noted   High temperature 05/21/2023   Leukocytosis 05/21/2023   History of CAD (coronary artery disease) 05/21/2023   Elevated temperature due to infection 05/21/2023   Lung nodule 05/21/2023   Hemorrhoids 03/22/2023   Internal hemorrhoid 03/22/2023   Bright red blood per rectum 03/21/2023  Prolonged QT interval 03/21/2023   Precordial pain    Critical limb ischemia of right lower extremity (HCC)    Acute osteomyelitis of toe, right (HCC) 03/13/2022   Osteomyelitis (HCC) 03/13/2022   Elevated troponin    SIRS (systemic inflammatory response syndrome) (HCC) 03/12/2022   ESRD (end stage renal disease) (HCC) 10/19/2021   Community acquired pneumonia 02/01/2021   PNA (pneumonia) 02/01/2021   Hypoxia    Pleural effusion on right 12/02/2020   Pericardial effusion 11/02/2020   Loss of consciousness (HCC) 09/16/2020   Syncope and collapse 09/15/2020   CKD (chronic kidney disease), stage IV (HCC) 08/11/2020   Acute kidney injury superimposed on CKD (HCC) 07/31/2020   Hyperlipidemia associated with type 2 diabetes mellitus (HCC) 07/31/2020   Paroxysmal atrial fibrillation (HCC) 07/31/2020   Chronic disease anemia 07/31/2020   Nausea and vomiting 07/31/2020   Peripheral artery disease (HCC) 03/19/2020   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, limited to breakdown of skin (HCC) 01/08/2020   Status  post vascular surgery 12/07/2019   Respiratory failure (HCC) 12/07/2019   Persistent atrial fibrillation (HCC) 07/10/2019   H/O: GI bleed 07/10/2019   CKD (chronic kidney disease) stage 5, GFR less than 15 ml/min (HCC) 06/08/2019   Erectile dysfunction 05/06/2019   Coronary artery disease involving native coronary artery of native heart without angina pectoris 04/05/2019   Acute on chronic respiratory failure with hypoxia (HCC) 12/10/2018   Healthcare-associated pneumonia 12/10/2018   Hemoptysis 12/10/2018   Sepsis (HCC) 12/10/2018   Acute respiratory failure with hypoxia (HCC) 12/10/2018   Malnutrition of moderate degree 12/02/2018   CAD (coronary artery disease) 12/01/2018   Syncope 12/01/2018   DOE (dyspnea on exertion) 11/18/2018   Hypertension associated with diabetes (HCC)    Hyperlipidemia    Diabetes mellitus with renal complications (HCC)    Noncompliance with medication regimen    Chronic pain syndrome 05/29/2018   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 [place] [address] Phone: [phone] Fax: [phone]     Social Determinants of Health (SDOH) Social History: SDOH Screenings   Food Insecurity: No Food Insecurity (05/21/2023)  Housing: Low Risk  (05/21/2023)  Transportation Needs: No Transportation Needs (03/21/2023)  Utilities: Not At Risk (05/21/2023)  Depression (PHQ2-9): Low Risk  (10/07/2020)  Tobacco Use: Medium Risk (05/21/2023)   SDOH Interventions: Transportation Interventions: Intervention Not Indicated, Inpatient TOC, Patient Resources (Friends/Family)   Readmission Risk  Interventions    05/22/2023   11:05 AM 03/22/2023    4:41 PM 03/19/2022    3:43 PM  Readmission Risk Prevention Plan  Transportation Screening Complete Complete Complete  Medication Review (RN Care Manager) Referral to Pharmacy Complete Complete  PCP or Specialist appointment within 3-5 days of discharge Complete Complete Complete  HRI or Home Care Consult Complete Complete Complete  SW Recovery Care/Counseling Consult Complete Complete Complete  Palliative Care Screening Not Applicable Not Applicable Not Applicable  Skilled Nursing Facility Not Applicable Not Applicable Not Applicable

## 2023-05-28 NOTE — Progress Notes (Signed)
PROGRESS NOTE    [facility]  FAO:[id number] DOB: 1951-10-04 DOA: 05/20/2023 PCP: Irven Coe, MD    Brief Narrative:  William Webb is a 72 y.o. male with medical history significant of ESRD on HD MWF, PAF on Eliquis, HTN, HLD, CAD, PAD, insulin-dependent DM-2 and internal hemorrhoids presented to emergency department as he developed some chills and 1 episode of loose stool at home.  Patient reported he noticed some chills since last 24 hours and he has 1 episode of loose stool on Sunday night.      Assessment and Plan: Infection of unknown origin/fever/Leukocytosis/loose stools -  Physical exam unremarkable to identify any source of infection.  Left-sided AV fistula site looks healthy without any sign of infection. -Negative COVID x2, NP swab negative - Chest x-ray unremarkable no acute disease process initially, have repeated w/o infiltrate - Patient reported loose stool and no recent antibiotic use.  CT abdomen obtained did not showed any acute intra-abdominal process and cholelithiasis. -no urine production for urinalysis  - blood culture x 2- NGTD - given empiric rocephin for 5 days with continued fever -ID consulted --no sign of blood clots and on eliquis -consulted ID -echo to r/o culture negative endocarditits   Loose stool - No recent antibiotic use. - C. difficile screen with PCR and GI panel -negative -added lactobacillus    Asymptomatic cholelithiasis - CT abdomen and pelvis showed incidental finding of cholelithiasis.  Patient denies any abdominal pain and right upper quadrant pain. -Stable hepatic panel. - Continue to monitor   Hypokalemia - replete   Incidental finding of lung nodule - CT abdomen pelvis showed incidental initial finding of solid nodules right lower and right middle lobe.  Recommended noncontrast chest CT in 3 to 6 months for follow-up. - Patient needs outpatient follow-up with PCP for repeat chest CT in 3-6 months   ESRD  on HD MWF  - Patient is euvolemic on physical exam - Compliant with dialysis.  Last dialysis 7/24 -renal consulted   Essential hypertension -resume home regimen   Paroxysmal atrial fibrillation - Continue Eliquis 5 mg twice daily and resuming Coreg 25 mg twice daily -Continue to monitor hemoglobin and development of any acute GI blood loss   History of CAD History of PAD Hyperlipidemia. - not on plavix per patient, statin and Coreg 25 mg twice daily   Internal hemorrhoid - Patient reported noticing some staker blood on the stool which is on and off. -monitor   Insulin dependent DM type II-well-controlled Per chart review most recent A1c 6 in May 2024 - SSI    Peripheral neuropathy - home gabapentin 300 mg twice daily   BPH - Flomax and Proscar.   GERD - Protonix 40 mg daily  OOB  Recent loss of brother- 2 months ago-- sister says he has gone down hill from then -trial of remeron at night for appetite    DVT prophylaxis: SCDs Start: 05/21/23 0155 apixaban (ELIQUIS) tablet 5 mg    Code Status: Full Code  Disposition Plan:  Level of care: Telemetry Medical Status is: Inpatient Remains inpatient appropriate  Spoke with sister Eber Jones  Consultants:  renal  ID   Subjective: No CP, no SOB  Objective: Vitals:   05/27/23 2115 05/28/23 0448 05/28/23 0614 05/28/23 0923  BP: (!) 166/59 (!) 153/56  (!) 158/60  Pulse: 70 73  68  Resp: 18   18  Temp: 98.4 F (36.9 C) (!) 103.1 F (39.5 C) 98.3 F (36.8 C) 98.6  F (37 C)  TempSrc: Oral Oral Oral   SpO2: 95% 98%  97%  Weight:      Height:        Intake/Output Summary (Last 24 hours) at 05/28/2023 1215 Last data filed at 05/28/2023 0600 Gross per 24 hour  Intake 480 ml  Output 500 ml  Net -20 ml   Filed Weights   05/24/23 0736 05/27/23 0750 05/27/23 1231  Weight: 79 kg 77.6 kg 78.3 kg    Examination:   General: Appearance:    Elderly male in no acute distress     Lungs:     respirations  unlabored  Heart:    Normal heart rate.       Neurologic:   Awake, alert pleasant       Data Reviewed: I have personally reviewed following labs and imaging studies  CBC: Recent Labs  Lab 05/22/23 0810 05/23/23 0220 05/24/23 0737 05/25/23 1111 05/26/23 1228 05/27/23 0251  WBC 14.4* 14.5* 19.6* 20.8* 21.6* 19.3*  NEUTROABS 13.2*  --   --  18.6* 19.5*  --   HGB 9.5* 10.1* 9.8* 10.4* 9.9* 10.0*  HCT 29.8* 30.7* 30.3* 32.8* 30.9* 31.6*  MCV 94.3 94.8 93.8 97.6 95.7 96.3  PLT 196 202 239 243 243 254   Basic Metabolic Panel: Recent Labs  Lab 05/21/23 1734 05/22/23 0809 05/22/23 0809 05/23/23 0220 05/24/23 0737 05/25/23 1111 05/27/23 0251 05/28/23 0029  NA  --  133*   < > 133* 133* 138 133* 134*  K  --  3.4*   < > 3.2* 3.5 3.5 3.3* 3.3*  CL  --  91*   < > 92* 92* 92* 97* 94*  CO2  --  23   < > 25 25 26 23 25   GLUCOSE  --  123*   < > 174* 158* 146* 221* 199*  BUN  --  43*   < > 32* 48* 31* 55* 30*  CREATININE  --  6.80*   < > 5.11* 6.57* 4.94* 7.42* 4.85*  CALCIUM  --  8.5*   < > 8.3* 8.7* 9.0 8.8* 8.6*  PHOS 2.6 3.2  --   --  2.8  --   --  3.0   < > = values in this interval not displayed.   GFR: Estimated Creatinine Clearance: 15 mL/min (A) (by C-G formula based on SCr of 4.85 mg/dL (H)). Liver Function Tests: Recent Labs  Lab 05/24/23 0737 05/26/23 1229 05/27/23 0251 05/28/23 0029 05/28/23 0030  AST  --  17 16  --  15  ALT  --  10 10  --  11  ALKPHOS  --  73 69  --  72  BILITOT  --  0.6 0.4  --  0.5  PROT  --  7.0 6.9  --  7.2  ALBUMIN 2.1* 2.0* 1.9* 2.1* 2.1*   No results for input(s): "LIPASE", "AMYLASE" in the last 168 hours. No results for input(s): "AMMONIA" in the last 168 hours. Coagulation Profile: No results for input(s): "INR", "PROTIME" in the last 168 hours.  Cardiac Enzymes: No results for input(s): "CKTOTAL", "CKMB", "CKMBINDEX", "TROPONINI" in the last 168 hours. BNP (last 3 results) No results for input(s): "PROBNP" in the last 8760  hours. HbA1C: No results for input(s): "HGBA1C" in the last 72 hours. CBG: Recent Labs  Lab 05/26/23 2143 05/27/23 1329 05/27/23 2115 05/28/23 0743 05/28/23 1134  GLUCAP 202* 151* 221* 148* 250*   Lipid Profile: No results for input(s): "CHOL", "HDL", "  LDLCALC", "TRIG", "CHOLHDL", "LDLDIRECT" in the last 72 hours. Thyroid Function Tests: No results for input(s): "TSH", "T4TOTAL", "FREET4", "T3FREE", "THYROIDAB" in the last 72 hours. Anemia Panel: No results for input(s): "VITAMINB12", "FOLATE", "FERRITIN", "TIBC", "IRON", "RETICCTPCT" in the last 72 hours.  Sepsis Labs: Recent Labs  Lab 05/24/23 0737  PROCALCITON 32.43    Recent Results (from the past 240 hour(s))  Culture, blood (Routine x 2)     Status: None   Collection Time: 05/20/23  9:49 PM   Specimen: BLOOD  Result Value Ref Range Status   Specimen Description BLOOD SITE NOT SPECIFIED  Final   Special Requests   Final    BOTTLES DRAWN AEROBIC AND ANAEROBIC Blood Culture results may not be optimal due to an inadequate volume of blood received in culture bottles   Culture   Final    NO GROWTH 5 DAYS Performed at [facility], [address], [place], [place] [address]    Report Status 05/25/2023 FINAL  Final  SARS Coronavirus 2 by RT PCR (hospital order, performed in [facility] hospital lab) *cepheid single result test* Anterior Nasal Swab     Status: None   Collection Time: 05/20/23 10:27 PM   Specimen: Anterior Nasal Swab  Result Value Ref Range Status   SARS Coronavirus 2 by RT PCR NEGATIVE NEGATIVE Final    Comment: Performed at [facility], [address], [place], [place] [address]  Culture, blood (Routine x 2)     Status: None   Collection Time: 05/20/23 10:29 PM   Specimen: BLOOD  Result Value Ref Range Status   Specimen Description BLOOD SITE NOT SPECIFIED  Final   Special Requests   Final    BOTTLES DRAWN AEROBIC AND ANAEROBIC Blood Culture adequate volume   Culture   Final    NO  GROWTH 5 DAYS Performed at [facility], [address], [place], [place] [address]    Report Status 05/25/2023 FINAL  Final  Gastrointestinal Panel by PCR , Stool     Status: None   Collection Time: 05/21/23  3:52 AM   Specimen: Stool  Result Value Ref Range Status   Campylobacter species NOT DETECTED NOT DETECTED Final   Plesimonas shigelloides NOT DETECTED NOT DETECTED Final   Salmonella species NOT DETECTED NOT DETECTED Final   Yersinia enterocolitica NOT DETECTED NOT DETECTED Final   Vibrio species NOT DETECTED NOT DETECTED Final   Vibrio cholerae NOT DETECTED NOT DETECTED Final   Enteroaggregative E coli (EAEC) NOT DETECTED NOT DETECTED Final   Enteropathogenic E coli (EPEC) NOT DETECTED NOT DETECTED Final   Enterotoxigenic E coli (ETEC) NOT DETECTED NOT DETECTED Final   Shiga like toxin producing E coli (STEC) NOT DETECTED NOT DETECTED Final   Shigella/Enteroinvasive E coli (EIEC) NOT DETECTED NOT DETECTED Final   Cryptosporidium NOT DETECTED NOT DETECTED Final   Cyclospora cayetanensis NOT DETECTED NOT DETECTED Final   Entamoeba histolytica NOT DETECTED NOT DETECTED Final   Giardia lamblia NOT DETECTED NOT DETECTED Final   Adenovirus F40/41 NOT DETECTED NOT DETECTED Final   Astrovirus NOT DETECTED NOT DETECTED Final   Norovirus GI/GII NOT DETECTED NOT DETECTED Final   Rotavirus A NOT DETECTED NOT DETECTED Final   Sapovirus (I, II, IV, and V) NOT DETECTED NOT DETECTED Final    Comment: Performed at [facility], [address], [place], [place] [address]  C Difficile Quick Screen w PCR reflex     Status: None  Collection Time: 05/21/23  3:52 AM   Specimen: Stool  Result Value Ref Range Status   C Diff antigen NEGATIVE NEGATIVE Final   C Diff toxin NEGATIVE NEGATIVE Final   C Diff interpretation No C. difficile detected.  Final    Comment: Performed at [facility] [place] [facility], [address] [place], [place] [address]  MRSA Next Gen by PCR, Nasal     Status:  None   Collection Time: 05/21/23  3:54 AM   Specimen: Nasal Mucosa; Nasal Swab  Result Value Ref Range Status   MRSA by PCR Next Gen NOT DETECTED NOT DETECTED Final    Comment: (NOTE) The GeneXpert MRSA Assay (FDA approved for NASAL specimens only), is one component of a comprehensive MRSA colonization surveillance program. It is not intended to diagnose MRSA infection nor to guide or monitor treatment for MRSA infections. Test performance is not FDA approved in patients less than 77 years old. Performed at [facility], [address] [place], [place] [address]   Respiratory (~20 pathogens) panel by PCR     Status: None   Collection Time: 05/24/23  4:10 PM   Specimen: Nasopharyngeal Swab; Respiratory  Result Value Ref Range Status   Adenovirus NOT DETECTED NOT DETECTED Final   Coronavirus 229E NOT DETECTED NOT DETECTED Final    Comment: (NOTE) The Coronavirus on the Respiratory Panel, DOES NOT test for the novel  Coronavirus (2019 nCoV)    Coronavirus HKU1 NOT DETECTED NOT DETECTED Final   Coronavirus NL63 NOT DETECTED NOT DETECTED Final   Coronavirus OC43 NOT DETECTED NOT DETECTED Final   Metapneumovirus NOT DETECTED NOT DETECTED Final   Rhinovirus / Enterovirus NOT DETECTED NOT DETECTED Final   Influenza A NOT DETECTED NOT DETECTED Final   Influenza B NOT DETECTED NOT DETECTED Final   Parainfluenza Virus 1 NOT DETECTED NOT DETECTED Final   Parainfluenza Virus 2 NOT DETECTED NOT DETECTED Final   Parainfluenza Virus 3 NOT DETECTED NOT DETECTED Final   Parainfluenza Virus 4 NOT DETECTED NOT DETECTED Final   Respiratory Syncytial Virus NOT DETECTED NOT DETECTED Final   Bordetella pertussis NOT DETECTED NOT DETECTED Final   Bordetella Parapertussis NOT DETECTED NOT DETECTED Final   Chlamydophila pneumoniae NOT DETECTED NOT DETECTED Final   Mycoplasma pneumoniae NOT DETECTED NOT DETECTED Final    Comment: Performed at [facility], [address], [place],  [place] [address]  SARS Coronavirus 2 by RT PCR (hospital order, performed in [facility] hospital lab) *cepheid single result test* Anterior Nasal Swab     Status: None   Collection Time: 05/24/23  4:10 PM   Specimen: Anterior Nasal Swab  Result Value Ref Range Status   SARS Coronavirus 2 by RT PCR NEGATIVE NEGATIVE Final    Comment: Performed at [facility], [address] [place], [place] [address]         Radiology Studies: No results found.      Scheduled Meds:  acidophilus  2 capsule Oral Daily   apixaban  5 mg Oral BID   atorvastatin  80 mg Oral Daily   carvedilol  25 mg Oral BID   doxercalciferol  5 mcg Intravenous Q M,W,F-HD   feeding supplement (NEPRO CARB STEADY)  237 mL Oral BID BM   finasteride  5 mg Oral Daily   gabapentin  300 mg Oral BID   insulin aspart  0-6 Units Subcutaneous TID WC   isosorbide mononitrate  60 mg Oral Daily  pantoprazole  40 mg Oral Daily   sodium chloride flush  3 mL Intravenous Q12H   tamsulosin  0.4 mg Oral QHS   temazepam  7.5 mg Oral QHS   Continuous Infusions:  sodium chloride       LOS: 7 days    Time spent: 45 minutes spent on chart review, discussion with nursing staff, consultants, updating family and interview/physical exam; more than 50% of that time was spent in counseling and/or coordination of care.    Joseph Art, DO [facility] Available via Epic secure chat 7am-7pm After these hours, please refer to coverage provider listed on [email or web address] 05/28/2023, 12:15 PM

## 2023-05-28 NOTE — Plan of Care (Signed)
  Problem: Nutritional: Goal: Maintenance of adequate nutrition will improve Outcome: Completed/Met

## 2023-05-29 DIAGNOSIS — R197 Diarrhea, unspecified: Secondary | ICD-10-CM | POA: Diagnosis not present

## 2023-05-29 DIAGNOSIS — Z992 Dependence on renal dialysis: Secondary | ICD-10-CM | POA: Diagnosis not present

## 2023-05-29 DIAGNOSIS — B999 Unspecified infectious disease: Secondary | ICD-10-CM | POA: Diagnosis not present

## 2023-05-29 DIAGNOSIS — E1129 Type 2 diabetes mellitus with other diabetic kidney complication: Secondary | ICD-10-CM | POA: Diagnosis not present

## 2023-05-29 DIAGNOSIS — M791 Myalgia, unspecified site: Secondary | ICD-10-CM | POA: Diagnosis not present

## 2023-05-29 DIAGNOSIS — R5381 Other malaise: Secondary | ICD-10-CM | POA: Diagnosis not present

## 2023-05-29 DIAGNOSIS — N186 End stage renal disease: Secondary | ICD-10-CM | POA: Diagnosis not present

## 2023-05-29 LAB — GLUCOSE, CAPILLARY
Glucose-Capillary: 121 mg/dL — ABNORMAL HIGH (ref 70–99)
Glucose-Capillary: 143 mg/dL — ABNORMAL HIGH (ref 70–99)
Glucose-Capillary: 174 mg/dL — ABNORMAL HIGH (ref 70–99)

## 2023-05-29 MED ORDER — PENTAFLUOROPROP-TETRAFLUOROETH EX AERO
INHALATION_SPRAY | CUTANEOUS | Status: AC
  Filled 2023-05-29: qty 30

## 2023-05-29 MED ORDER — HEPARIN SODIUM (PORCINE) 1000 UNIT/ML IJ SOLN
INTRAMUSCULAR | Status: AC
  Filled 2023-05-29: qty 5

## 2023-05-29 NOTE — Progress Notes (Signed)
   05/29/23 1220  Vitals  Temp (!) 100.7 F (38.2 C)  Pulse Rate 79  Resp 16  BP (!) 178/62  SpO2 98 %  O2 Device Room Air  Weight 76.8 kg  Oxygen Therapy  Patient Activity (if Appropriate) In bed  Post Treatment  Dialyzer Clearance Clear  Duration of HD Treatment -hour(s) 3.75 hour(s)  Hemodialysis Intake (mL) 0 mL  Liters Processed 90  Fluid Removed (mL) 0 mL  Tolerated HD Treatment Yes  AVG/AVF Arterial Site Held (minutes) 8 minutes  AVG/AVF Venous Site Held (minutes) 8 minutes   Received patient in bed to unit.  Alert and oriented.  Informed consent signed and in chart.   TX duration:3.75hrs  Patient tolerated well.  Transported back to the room  Alert, without acute distress.  Hand-off given to patient's nurse.   Access used: LAVF Access issues: none  Total UF removed: 0  Medication(s) given: Tylenol 650mg    [facility]
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
## 2023-05-29 NOTE — TOC Progression Note (Signed)
Transition of Care [facility]) - Initial/Assessment Note    Patient Details  Name: William Webb MRN: [id number] Date of Birth: 10-29-1951  Transition of Care [facility]) CM/SW Contact:    Ralene Bathe, LCSW Phone Number: 05/29/2023, 2:42 PM  Clinical Narrative:                 LCSW met with the patient at bedside.  Patient is agreeable with discharging to [facility] (SNF) when medically stable.    TOC following.   Barriers to Discharge: Continued Medical Work up   Patient Goals and CMS Choice Patient states their goals for this hospitalization and ongoing recovery are:: To return home CMS Medicare.gov Compare Post Acute Care list provided to:: Patient Choice offered to / list presented to : Patient      Expected Discharge Plan and Services   Discharge Planning Services: CM Consult   Living arrangements for the past 2 months: Apartment                                      Prior Living Arrangements/Services Living arrangements for the past 2 months: Apartment Lives with:: Self Patient language and need for interpreter reviewed:: Yes Do you feel safe going back to the place where you live?: Yes      Need for Family Participation in Patient Care: Yes (Comment) Care giver support system in place?: Yes (comment) Current home services: DME (Cane, walker, shower seat.) Criminal Activity/Legal Involvement Pertinent to Current Situation/Hospitalization: No - Comment as needed  Activities of Daily Living Home Assistive Devices/Equipment: CBG Meter, Cane (specify quad or straight), Blood pressure cuff ADL Screening (condition at time of admission) Patient's cognitive ability adequate to safely complete daily activities?: Yes Is the patient deaf or have difficulty hearing?: No Does the patient have difficulty seeing, even when wearing glasses/contacts?: No Does the patient have difficulty concentrating, remembering, or making decisions?: No Patient able to  express need for assistance with ADLs?: Yes Does the patient have difficulty dressing or bathing?: No Independently performs ADLs?: Yes (appropriate for developmental age) Does the patient have difficulty walking or climbing stairs?: Yes Weakness of Legs: Both Weakness of Arms/Hands: None  Permission Sought/Granted Permission sought to share information with : Case Manager, Family Supports Permission granted to share information with : Yes, Verbal Permission Granted              Emotional Assessment Appearance:: Appears stated age Attitude/Demeanor/Rapport: Engaged, Gracious Affect (typically observed): Accepting, Appropriate, Calm, Hopeful, Pleasant Orientation: : Oriented to Self, Oriented to Place, Oriented to  Time, Oriented to Situation Alcohol / Substance Use: Not Applicable Psych Involvement: No (comment)  Admission diagnosis:  Elevated temperature due to infection [B99.9] Fever, unspecified fever cause [R50.9] Diarrhea, unspecified type [R19.7] Patient Active Problem List   Diagnosis Date Noted   Pressure injury of skin 05/28/2023   FUO (fever of unknown origin) 05/21/2023   Leukocytosis 05/21/2023   History of CAD (coronary artery disease) 05/21/2023   Elevated temperature due to infection 05/21/2023   Pulmonary nodules 05/21/2023   Hemorrhoids 03/22/2023   Internal hemorrhoid 03/22/2023   Bright red blood per rectum 03/21/2023   Prolonged QT interval 03/21/2023   Precordial pain    Critical limb ischemia of right lower extremity (HCC)    Acute osteomyelitis of toe, right (HCC) 03/13/2022   Osteomyelitis (HCC) 03/13/2022   Elevated troponin    SIRS (systemic  inflammatory response syndrome) (HCC) 03/12/2022   ESRD (end stage renal disease) (HCC) 10/19/2021   Community acquired pneumonia 02/01/2021   PNA (pneumonia) 02/01/2021   Hypoxia    Pleural effusion on right 12/02/2020   Pericardial effusion 11/02/2020   Loss of consciousness (HCC) 09/16/2020   Syncope  and collapse 09/15/2020   CKD (chronic kidney disease), stage IV (HCC) 08/11/2020   Acute kidney injury superimposed on CKD (HCC) 07/31/2020   Hyperlipidemia associated with type 2 diabetes mellitus (HCC) 07/31/2020   Paroxysmal atrial fibrillation (HCC) 07/31/2020   Chronic disease anemia 07/31/2020   Nausea and vomiting 07/31/2020   Peripheral artery disease (HCC) 03/19/2020   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, limited to breakdown of skin (HCC) 01/08/2020   Status post vascular surgery 12/07/2019   Respiratory failure (HCC) 12/07/2019   Persistent atrial fibrillation (HCC) 07/10/2019   H/O: GI bleed 07/10/2019   CKD (chronic kidney disease) stage 5, GFR less than 15 ml/min (HCC) 06/08/2019   Erectile dysfunction 05/06/2019   Coronary artery disease involving native coronary artery of native heart without angina pectoris 04/05/2019   Acute on chronic respiratory failure with hypoxia (HCC) 12/10/2018   Healthcare-associated pneumonia 12/10/2018   Hemoptysis 12/10/2018   Sepsis (HCC) 12/10/2018   Acute respiratory failure with hypoxia (HCC) 12/10/2018   Malnutrition of moderate degree 12/02/2018   CAD (coronary artery disease) 12/01/2018   Syncope 12/01/2018   DOE (dyspnea on exertion) 11/18/2018   Hypertension associated with diabetes (HCC)    Hyperlipidemia    Diabetes mellitus with renal complications (HCC)    Noncompliance with medication regimen    Chronic pain syndrome 05/29/2018   Insulin dependent type 2 diabetes mellitus (HCC) 04/07/2018   Stage 3a chronic kidney disease (HCC) 03/11/2018   Fall 11/20/2017   AKI (acute kidney injury) (HCC) 11/20/2017   Normocytic normochromic anemia 11/20/2017   Hypoglycemia 11/19/2017   Essential hypertension 11/19/2017   PCP:  Irven Coe, MD Pharmacy:   [id number] - [place],  - [address] [place] [address] Phone: [phone] Fax: [phone]     Social Determinants of  Health (SDOH) Social History: SDOH Screenings   Food Insecurity: No Food Insecurity (05/21/2023)  Housing: Low Risk  (05/21/2023)  Transportation Needs: No Transportation Needs (03/21/2023)  Utilities: Not At Risk (05/21/2023)  Depression (PHQ2-9): Low Risk  (10/07/2020)  Tobacco Use: Medium Risk (05/21/2023)   SDOH Interventions: Transportation Interventions: Intervention Not Indicated, Inpatient TOC, Patient Resources (Friends/Family)   Readmission Risk Interventions    05/22/2023   11:05 AM 03/22/2023    4:41 PM 03/19/2022    3:43 PM  Readmission Risk Prevention Plan  Transportation Screening Complete Complete Complete  Medication Review (RN Care Manager) Referral to Pharmacy Complete Complete  PCP or Specialist appointment within 3-5 days of discharge Complete Complete Complete  HRI or Home Care Consult Complete Complete Complete  SW Recovery Care/Counseling Consult Complete Complete Complete  Palliative Care Screening Not Applicable Not Applicable Not Applicable  Skilled Nursing Facility Not Applicable Not Applicable Not Applicable

## 2023-05-29 NOTE — Progress Notes (Signed)
Physical Therapy Treatment Patient Details Name: William Webb [facility] MRN: [id number] DOB: Aug 03, 1951 Today's Date: 05/29/2023   History of Present Illness 72 y.o. male presents to [facility] hospital on 05/20/2023 with chills and diarrhea, found to have fever in ED. Pt admitted for infection of unknown origin. PMH includes ESRD, PAF, HTN, HLD, CAD, PAD, DMII.    PT Comments  Pt received in supine, c/o fatigue after HD earlier in the day but agreeable to therapy session with emphasis on seated/standing balance and transfer training. Pt with L lean sitting and standing and needs multimodal cues and visual cue from mirror in his room to work on correcting to neutral seated posture. Pt unable to weight shift in stance due to fatigue and with bowel incontinence, pt given lower body bath and linens changed during session with NT called in to room to assist. Pt needing +2-3 maxA standing in [place] platform to ensure safety with increased hygiene needs/fatigue. Pt continues to benefit from PT services to progress toward functional mobility goals.     If plan is discharge home, recommend the following: A lot of help with bathing/dressing/bathroom;Assistance with cooking/housework;Direct supervision/assist for medications management;Direct supervision/assist for financial management;Assist for transportation;Help with stairs or ramp for entrance;Two people to help with walking and/or transfers   Can travel by private vehicle     No  Equipment Recommendations  Wheelchair (measurements PT);Wheelchair cushion (measurements PT) (consider mechanical lift and hospital bed pending progress)    Recommendations for Other Services       Precautions / Restrictions Precautions Precautions: Fall Precaution Comments: on/off fevers, b/b incontinence Restrictions Weight Bearing Restrictions: No     Mobility  Bed Mobility Overal bed mobility: Needs Assistance Bed Mobility: Rolling, Sidelying to Sit, Sit to  Sidelying Rolling: Mod assist Sidelying to sit: Max assist, HOB elevated, +2 for safety/equipment     Sit to sidelying: Mod assist General bed mobility comments: Increased time/effort to initiate and perform tasks; dense cues for pt to pull himself forward with bed rail but pt has difficulty sequencing; ended up needing HHA and maxA for trunk rise to L EOB. Posterior lean upon sitting upright.    Transfers Overall transfer level: Needs assistance Equipment used: Ambulation equipment used Transfers: Sit to/from Stand Sit to Stand: Max assist, +2 physical assistance, From elevated surface           General transfer comment: STS x3 from elevated EOB with Stedy +2 maxA. Pt impulsive to sit after <60 seconds due to fatigue, needed x3 stands while linens changed/for hygiene assist due to incontinence. Pt with posterior bias and difficulty fully extending bil knees and trunk in [place]. +3 for safety with final stand to move Stedy flaps and finish placing bed linens under him. Pt unable to weight shift in stance. Transfer via Lift Equipment: Stedy  Ambulation/Gait               General Gait Details: NT; pt unable to weight shift in stance, posterior lean   Stairs             Wheelchair Mobility     Tilt Bed    Modified Rankin (Stroke Patients Only)       Balance Overall balance assessment: Needs assistance Sitting-balance support: Single extremity supported, Feet supported Sitting balance-Leahy Scale: Poor Sitting balance - Comments: heavy L lean sitting EOB needing variable min to maxA while seated, used mirror while seated on Stedy seat to encourage pt awareness of his body positioning, but pt still needing  dense cues to correct L lean.   Standing balance support: Bilateral upper extremity supported Standing balance-Leahy Scale: Zero Standing balance comment: +2 maxA today to stand fully upright                            Cognition Arousal/Alertness:  Lethargic (fatigued/drowsy) Behavior During Therapy: Flat affect, WFL for tasks assessed/performed Overall Cognitive Status: Difficult to assess Area of Impairment: Safety/judgement, Awareness, Problem solving                         Safety/Judgement: Decreased awareness of safety, Decreased awareness of deficits Awareness: Intellectual Problem Solving: Slow processing, Difficulty sequencing, Requires verbal cues General Comments: A&O x4. Multimodal cues needed. Pt drowsy today and poor awareness of his body's positioning and safety, needs dense cues throughout. Pt wtih bowel incontinence and seemingly unaware.        Exercises Other Exercises Other Exercises: pt defers today, too fatigued after HD    General Comments        Pertinent Vitals/Pain Pain Assessment Pain Assessment: Faces Faces Pain Scale: Hurts a little bit Pain Location: not localized Pain Descriptors / Indicators: Grimacing, Discomfort Pain Intervention(s): Monitored during session, Repositioned     PT Goals (current goals can now be found in the care plan section) Acute Rehab PT Goals Patient Stated Goal: to improve strength and return to independence PT Goal Formulation: With patient Time For Goal Achievement: 06/07/23 Progress towards PT goals: Progressing toward goals    Frequency    Min 1X/week      PT Plan Current plan remains appropriate       AM-PAC PT "6 Clicks" Mobility   Outcome Measure  Help needed turning from your back to your side while in a flat bed without using bedrails?: A Lot Help needed moving from lying on your back to sitting on the side of a flat bed without using bedrails?: A Lot Help needed moving to and from a bed to a chair (including a wheelchair)?: Total Help needed standing up from a chair using your arms (e.g., wheelchair or bedside chair)?: Total Help needed to walk in hospital room?: Total Help needed climbing 3-5 steps with a railing? : Total 6  Click Score: 8    End of Session Equipment Utilized During Treatment: Gait belt Activity Tolerance: Patient limited by fatigue Patient left: with call bell/phone within reach;in bed;with bed alarm set;Other (comment) (heels floated, bed in chair posture, dinner tray set up for him) Nurse Communication: Mobility status;Need for lift equipment;Other (comment) (NT notified pt likely to need assist to cut up his meat) PT Visit Diagnosis: Other abnormalities of gait and mobility (R26.89);Muscle weakness (generalized) (M62.81)     Time: 1633-1700 PT Time Calculation (min) (ACUTE ONLY): 27 min  Charges:    $Therapeutic Activity: 23-37 mins PT General Charges $$ ACUTE PT VISIT: 1 Visit                     Leotha Voeltz P., PTA Acute Rehabilitation Services Secure Chat Preferred 9a-5:30pm Office: [phone]    Kaesen Shellenbarger [facility] 05/29/2023, 7:38 PM

## 2023-05-29 NOTE — Progress Notes (Signed)
[facility]  Date of Admission:  05/20/2023     Lines:  Lue avg   Abx: 7/22-28 ceftriaxone 7/27-28 azith                                                          Assessment: 72 yo male with below problems admitted for 3-4 days of malaise, fever, diarrhea, so far negative id w/u but continued fever despite abx. Noted uptrending leukocytosis but stable hemodynamics     Reports initial headache, sorethroat which resolved, but malaise/diarrhea/myalgia remains.    His stool here is varying between soft/diarrhea       Sign/symptomatology Initial lft normal; repeat lft 7/28 remains normal Wbc trending up No thrombocytopenia ?diarrhea Myalgia/malaise   ID w/u: 7/22 bcx ngtd 7/26 abd pelv ct 7/23 gi pcr and cdiff screen negative 7/26 respiratory pcr negative     Ddx (not particularly related to patient) Viral syndrome still Non-GI primary bacterial sepsis syndrome with substantial gi sx -- legionella, typhoid, tularemia, qfever, brucella some of the more thought about bacteria outside of tickborne illness, malaria tick born illnesses such as spotted fever or spirochete disease such as leptospirosis   Other fuo consideration is culture negative endocarditis     He really has no risk factor for bacterial syndrome considered. And I suspect this remains a viral process still   Non-id cause considering lymphoma  -------------- 7/30 assessment Ongoing fever since 7/22 admission but improving otherwise. Stool more formed, no headache, improved myalgia, more energetic and better appetite  No sign of worsening off abx  While I still think it's a viral process, would complete f/u w/u and look for cnIE and lymphoma No sign of rheumatologic syndrome No sign of drug fever  7/28 peripheral blood smear unremarkable    ---------------- 7/31 assessment Ct chest abd pelv 7/30 showed pulm nodules bilaterally and most < 1cm He continues to  fever but overall continues to feel better Tte no vegetation Ldh level normal   Suspect still viral process (can do nodules although most are < 10 mm). Endemic fungal process (non-cocci) and crypto if present anticipated to be self limited. But will need monitoring    Plan: Crypto, histo, blasto serology ordered (should all be done by tomorrow) If continued subjective improvement can discharge and I can follow in clinic for serology and continued monitoring He has id clinic appointment with me on 8/22 @ 230 pm Will sign off Discussed with primary team     Principal Problem:   Elevated temperature due to infection Active Problems:   Essential hypertension   Insulin dependent type 2 diabetes mellitus (HCC)   Peripheral artery disease (HCC)   Paroxysmal atrial fibrillation (HCC)   Chronic disease anemia   ESRD (end stage renal disease) (HCC)   Internal hemorrhoid   Fever, unknown origin   Leukocytosis   History of CAD (coronary artery disease)   Lung nodule   Pressure injury of skin   No Known Allergies  Scheduled Meds:  acidophilus  2 capsule Oral Daily   apixaban  5 mg Oral BID   atorvastatin  80 mg Oral Daily   carvedilol  25 mg Oral BID   doxercalciferol  5 mcg Intravenous Q M,W,F-HD   feeding supplement (NEPRO  CARB STEADY)  237 mL Oral BID BM   finasteride  5 mg Oral Daily   gabapentin  300 mg Oral BID   heparin sodium (porcine)       insulin aspart  0-6 Units Subcutaneous TID WC   isosorbide mononitrate  60 mg Oral Daily   pantoprazole  40 mg Oral Daily   pentafluoroprop-tetrafluoroeth       sodium chloride flush  3 mL Intravenous Q12H   tamsulosin  0.4 mg Oral QHS   temazepam  7.5 mg Oral QHS   Continuous Infusions:  sodium chloride     PRN Meds:.sodium chloride, acetaminophen, heparin sodium (porcine), hydrALAZINE, [COMPLETED] loperamide **FOLLOWED BY** loperamide, mouth rinse, pentafluoroprop-tetrafluoroeth, sodium chloride flush   SUBJECTIVE: Feels  stronger, better appetite No myalgia/headache No cough No rash No n/v Diarrhea more formed and decreased frequency  Review of Systems: ROS All other ROS was negative, except mentioned above     OBJECTIVE: Vitals:   05/29/23 1133 05/29/23 1215 05/29/23 1220 05/29/23 1302  BP: (!) 172/74 (!) 168/60 (!) 178/62 (!) 182/62  Pulse: 77 78 79 77  Resp: 17 10 16 14   Temp:   (!) 100.7 F (38.2 C) 99.6 F (37.6 C)  TempSrc:    Oral  SpO2: 98% 94% 98% 99%  Weight:   76.8 kg   Height:       Body mass index is 21.16 kg/m.  Physical Exam General/constitutional: no distress, pleasant, conversant HEENT: Normocephalic, PER, Conj Clear, EOMI, Oropharynx clear Neck supple CV: rrr no mrg Lungs: clear to auscultation, normal respiratory effort Abd: Soft, Nontender Ext: no edema; lue avg nontender Skin: No Rash Neuro: generalized weakness MSK: no peripheral joint swelling/tenderness/warmth; back spines nontender   Lab Results Lab Results  Component Value Date   WBC 17.4 (H) 05/29/2023   HGB 10.1 (L) 05/29/2023   HCT 31.6 (L) 05/29/2023   MCV 94.3 05/29/2023   PLT 247 05/29/2023    Lab Results  Component Value Date   CREATININE 6.53 (H) 05/29/2023   BUN 44 (H) 05/29/2023   NA 134 (L) 05/29/2023   K 3.9 05/29/2023   CL 91 (L) 05/29/2023   CO2 26 05/29/2023    Lab Results  Component Value Date   ALT 11 05/28/2023   AST 15 05/28/2023   ALKPHOS 72 05/28/2023   BILITOT 0.5 05/28/2023      Microbiology: Recent Results (from the past 240 hour(s))  Culture, blood (Routine x 2)     Status: None   Collection Time: 05/20/23  9:49 PM   Specimen: BLOOD  Result Value Ref Range Status   Specimen Description BLOOD SITE NOT SPECIFIED  Final   Special Requests   Final    BOTTLES DRAWN AEROBIC AND ANAEROBIC Blood Culture results may not be optimal due to an inadequate volume of blood received in culture bottles   Culture   Final    NO GROWTH 5 DAYS Performed at [facility], [address] [place], [place] [address]    Report Status 05/25/2023 FINAL  Final  SARS Coronavirus 2 by RT PCR (hospital order, performed in [facility] hospital lab) *cepheid single result test* Anterior Nasal Swab     Status: None   Collection Time: 05/20/23 10:27 PM   Specimen: Anterior Nasal Swab  Result Value Ref Range Status   SARS Coronavirus 2 by RT PCR NEGATIVE NEGATIVE Final    Comment: Performed at [facility], [address] [place], [place]  [address]  Culture, blood (Routine x 2)     Status: None   Collection Time: 05/20/23 10:29 PM   Specimen: BLOOD  Result Value Ref Range Status   Specimen Description BLOOD SITE NOT SPECIFIED  Final   Special Requests   Final    BOTTLES DRAWN AEROBIC AND ANAEROBIC Blood Culture adequate volume   Culture   Final    NO GROWTH 5 DAYS Performed at [facility], [address] [place], [place] [address]    Report Status 05/25/2023 FINAL  Final  Gastrointestinal Panel by PCR , Stool     Status: None   Collection Time: 05/21/23  3:52 AM   Specimen: Stool  Result Value Ref Range Status   Campylobacter species NOT DETECTED NOT DETECTED Final   Plesimonas shigelloides NOT DETECTED NOT DETECTED Final   Salmonella species NOT DETECTED NOT DETECTED Final   Yersinia enterocolitica NOT DETECTED NOT DETECTED Final   Vibrio species NOT DETECTED NOT DETECTED Final   Vibrio cholerae NOT DETECTED NOT DETECTED Final   Enteroaggregative E coli (EAEC) NOT DETECTED NOT DETECTED Final   Enteropathogenic E coli (EPEC) NOT DETECTED NOT DETECTED Final   Enterotoxigenic E coli (ETEC) NOT DETECTED NOT DETECTED Final   Shiga like toxin producing E coli (STEC) NOT DETECTED NOT DETECTED Final   Shigella/Enteroinvasive E coli (EIEC) NOT DETECTED NOT DETECTED Final   Cryptosporidium NOT DETECTED NOT DETECTED Final   Cyclospora cayetanensis NOT DETECTED NOT DETECTED Final   Entamoeba histolytica NOT DETECTED NOT DETECTED Final   Giardia  lamblia NOT DETECTED NOT DETECTED Final   Adenovirus F40/41 NOT DETECTED NOT DETECTED Final   Astrovirus NOT DETECTED NOT DETECTED Final   Norovirus GI/GII NOT DETECTED NOT DETECTED Final   Rotavirus A NOT DETECTED NOT DETECTED Final   Sapovirus (I, II, IV, and V) NOT DETECTED NOT DETECTED Final    Comment: Performed at [facility], [address] [place], [place] [address]  C Difficile Quick Screen w PCR reflex     Status: None   Collection Time: 05/21/23  3:52 AM   Specimen: Stool  Result Value Ref Range Status   C Diff antigen NEGATIVE NEGATIVE Final   C Diff toxin NEGATIVE NEGATIVE Final   C Diff interpretation No C. difficile detected.  Final    Comment: Performed at [facility], [address], [place], [place] [address]  MRSA Next Gen by PCR, Nasal     Status: None   Collection Time: 05/21/23  3:54 AM   Specimen: Nasal Mucosa; Nasal Swab  Result Value Ref Range Status   MRSA by PCR Next Gen NOT DETECTED NOT DETECTED Final    Comment: (NOTE) The GeneXpert MRSA Assay (FDA approved for NASAL specimens only), is one component of a comprehensive MRSA colonization surveillance program. It is not intended to diagnose MRSA infection nor to guide or monitor treatment for MRSA infections. Test performance is not FDA approved in patients less than 38 years old. Performed at [facility], [address], [place], [place] [address]   Respiratory (~20 pathogens) panel by PCR     Status: None   Collection Time: 05/24/23  4:10 PM   Specimen: Nasopharyngeal Swab; Respiratory  Result Value Ref Range Status   Adenovirus NOT DETECTED NOT DETECTED Final   Coronavirus 229E NOT DETECTED NOT DETECTED Final    Comment: (NOTE) The Coronavirus on the Respiratory Panel, DOES NOT test for the novel  Coronavirus (2019 nCoV)  Coronavirus HKU1 NOT DETECTED NOT DETECTED Final   Coronavirus NL63 NOT DETECTED NOT DETECTED Final   Coronavirus OC43 NOT DETECTED NOT DETECTED  Final   Metapneumovirus NOT DETECTED NOT DETECTED Final   Rhinovirus / Enterovirus NOT DETECTED NOT DETECTED Final   Influenza A NOT DETECTED NOT DETECTED Final   Influenza B NOT DETECTED NOT DETECTED Final   Parainfluenza Virus 1 NOT DETECTED NOT DETECTED Final   Parainfluenza Virus 2 NOT DETECTED NOT DETECTED Final   Parainfluenza Virus 3 NOT DETECTED NOT DETECTED Final   Parainfluenza Virus 4 NOT DETECTED NOT DETECTED Final   Respiratory Syncytial Virus NOT DETECTED NOT DETECTED Final   Bordetella pertussis NOT DETECTED NOT DETECTED Final   Bordetella Parapertussis NOT DETECTED NOT DETECTED Final   Chlamydophila pneumoniae NOT DETECTED NOT DETECTED Final   Mycoplasma pneumoniae NOT DETECTED NOT DETECTED Final    Comment: Performed at [facility], [address] [place], [place] [address]  SARS Coronavirus 2 by RT PCR (hospital order, performed in [facility] hospital lab) *cepheid single result test* Anterior Nasal Swab     Status: None   Collection Time: 05/24/23  4:10 PM   Specimen: Anterior Nasal Swab  Result Value Ref Range Status   SARS Coronavirus 2 by RT PCR NEGATIVE NEGATIVE Final    Comment: Performed at [facility], [address], [place], [place] [address]     Serology:   Imaging: If present, new imagings (plain films, ct scans, and mri) have been personally visualized and interpreted; radiology reports have been reviewed. Decision making incorporated into the Impression / Recommendations.  7/30 ct chest abd pelv 1. Several part solid right lung nodules favored to represent infectious/inflammatory etiology. Clinical correlation and close follow-up to resolution after treatment recommended. 2. Trace bilateral pleural effusions. 3. Cholelithiasis. Ultrasound may provide better evaluation of the gallbladder if there is clinical concern for acute cholecystitis. 4. No bowel obstruction. Normal appendix. 5.  Aortic Atherosclerosis   7/30 tte  1. Left  ventricular ejection fraction, by estimation, is 50%. The left  ventricle has mildly decreased function. The left ventricle demonstrates  global hypokinesis. There is moderate concentric left ventricular  hypertrophy. Left ventricular diastolic  parameters are consistent with Grade II diastolic dysfunction  (pseudonormalization). The average left ventricular global longitudinal  strain is -11.5 %. The global longitudinal strain is abnormal.   2. Right ventricular systolic function is normal. The right ventricular  size is normal. There is normal pulmonary artery systolic pressure. The  estimated right ventricular systolic pressure is 28.0 mmHg.   3. Left atrial size was mildly dilated.   4. Right atrial size was mildly dilated.   5. The mitral valve is normal in structure. Trivial mitral valve  regurgitation. No evidence of mitral stenosis. Moderate mitral annular  calcification.   6. The aortic valve is tricuspid. There is severe calcifcation of the  aortic valve. Aortic valve regurgitation is not visualized. Mild aortic  valve stenosis. Aortic valve area, by VTI measures 1.58 cm. Aortic valve  mean gradient measures 12.0 mmHg.   7. The inferior vena cava is normal in size with greater than 50%  respiratory variability, suggesting right atrial pressure of 3 mmHg.   8. A small pericardial effusion is present.   9. No definite valvular vegetation was noted.    7/27 cxr Mild cardiomegaly. No active lung disease.   Raymondo Band, MD [facility] Infectious Disease [facility] [phone] pager  05/29/2023, 2:08 PM

## 2023-05-29 NOTE — Progress Notes (Addendum)
Subjective:  seen at start of hd tx this am ( nl schedule)_  and reports feeling stronger than yest  and Diarrhea improved .   Objective Vital signs in last 24 hours: Vitals:   05/28/23 1649 05/28/23 2048 05/28/23 2148 05/29/23 0416  BP: (!) 157/62 (!) 126/49  (!) 156/66  Pulse: 71 71  (!) 59  Resp:   20 18  Temp: 98.5 F (36.9 C) (!) 101.6 F (38.7 C) 99.8 F (37.7 C) 97.8 F (36.6 C)  TempSrc: Oral Oral    SpO2: 98% 96%  97%  Weight:      Height:       Weight change:   Physical Exam: General: Alert Elderly male  more interactive this am , NAD Heart: RRR 1/6 SEM Lungs: CTA anteriorly nonlabored breathing Abdomen: NABS soft NT ND Extremities: No pedal edema Dialysis Access: LUA AVF + bruit    OP Dialysis Orders: MWF at [facility] 4hr, 425/800, EDW 82.5kg, 2K/2Ca bath, AVF, heparin 5000 unit bolus - Last HD 7/22, left below EDW (80.5kg) - dry weight has been lowered multiple times - Hectoral IV q HD - no ESA   Problem/Plan: 1. Fever/myalgias + diarrhea: admit wu  so no findings and ID eval  yest ,  2d echo  and  Abd CT  abd/ chest / pelvis with contrast with contrast , WBC has been high.  Am labs pend .had been on  empiric ceftriaxone. Now off . "Possible  viral process" 2. ESRD: Continue HD on MWF schedule -  HD today    k 3.9 .  Prior hypokalemia  3. HTN/volume: BP up slightly, continue home BP meds for now. No edema. Hold Lasix.  Well below EDW. Using  UF 0L with HD today  and last  tx with decr po  and diarrhea / diet changed yest  to carb mod   with  low k and phos  4. Anemia: Hgb 10s- monitor without ESA for now. 5. Secondary hyperparathyroidism: Ca ok, Phos lower  (3.0 )(not eating well) - off binders. Continue VDRA. 6. Nutrition: Alb low, continue supps as tolerated - although he reports that Nepro often causes diarrhea..  changed diet to carb  mod healthy heart with fluid restrictions, low phos ,  low k  monitor labs 7. Paroxysmal A-fib: On Eliquis. 8. Incidental  cholelithiasis and lung nodules on imaging -> needs outpatient f/u 9. Dispo: Suspect will need SNF - lives alone.  Patient realizes today  William Pastel, PA-C [facility] Beeper [phone] 05/29/2023,7:57 AM  LOS: 8 days   Labs: Basic Metabolic Panel: Recent Labs  Lab 05/22/23 0809 05/23/23 0220 05/24/23 0737 05/25/23 1111 05/27/23 0251 05/28/23 0029 05/29/23 0318  NA 133*   < > 133*   < > 133* 134* 134*  K 3.4*   < > 3.5   < > 3.3* 3.3* 3.9  CL 91*   < > 92*   < > 97* 94* 91*  CO2 23   < > 25   < > 23 25 26   GLUCOSE 123*   < > 158*   < > 221* 199* 178*  BUN 43*   < > 48*   < > 55* 30* 44*  CREATININE 6.80*   < > 6.57*   < > 7.42* 4.85* 6.53*  CALCIUM 8.5*   < > 8.7*   < > 8.8* 8.6* 9.1  PHOS 3.2  --  2.8  --   --  3.0  --    < > =  values in this interval not displayed.   Liver Function Tests: Recent Labs  Lab 05/26/23 1229 05/27/23 0251 05/28/23 0029 05/28/23 0030  AST 17 16  --  15  ALT 10 10  --  11  ALKPHOS 73 69  --  72  BILITOT 0.6 0.4  --  0.5  PROT 7.0 6.9  --  7.2  ALBUMIN 2.0* 1.9* 2.1* 2.1*   No results for input(s): "LIPASE", "AMYLASE" in the last 168 hours. No results for input(s): "AMMONIA" in the last 168 hours. CBC: Recent Labs  Lab 05/22/23 0810 05/23/23 0220 05/24/23 0737 05/25/23 1111 05/26/23 1228 05/27/23 0251 05/29/23 0318  WBC 14.4*   < > 19.6* 20.8* 21.6* 19.3* 17.4*  NEUTROABS 13.2*  --   --  18.6* 19.5*  --   --   HGB 9.5*   < > 9.8* 10.4* 9.9* 10.0* 10.1*  HCT 29.8*   < > 30.3* 32.8* 30.9* 31.6* 31.6*  MCV 94.3   < > 93.8 97.6 95.7 96.3 94.3  PLT 196   < > 239 243 243 254 247   < > = values in this interval not displayed.   Cardiac Enzymes: No results for input(s): "CKTOTAL", "CKMB", "CKMBINDEX", "TROPONINI" in the last 168 hours. CBG: Recent Labs  Lab 05/27/23 1329 05/27/23 2115 05/28/23 0743 05/28/23 1134 05/28/23 2048  GLUCAP 151* 221* 148* 250* 161*     Medications:  sodium chloride       acidophilus  2 capsule Oral Daily   apixaban  5 mg Oral BID   atorvastatin  80 mg Oral Daily   carvedilol  25 mg Oral BID   doxercalciferol  5 mcg Intravenous Q M,W,F-HD   feeding supplement (NEPRO CARB STEADY)  237 mL Oral BID BM   finasteride  5 mg Oral Daily   gabapentin  300 mg Oral BID   insulin aspart  0-6 Units Subcutaneous TID WC   isosorbide mononitrate  60 mg Oral Daily   pantoprazole  40 mg Oral Daily   sodium chloride flush  3 mL Intravenous Q12H   tamsulosin  0.4 mg Oral QHS   temazepam  7.5 mg Oral QHS

## 2023-05-30 DIAGNOSIS — R509 Fever, unspecified: Secondary | ICD-10-CM | POA: Diagnosis not present

## 2023-05-30 LAB — GLUCOSE, CAPILLARY
Glucose-Capillary: 133 mg/dL — ABNORMAL HIGH (ref 70–99)
Glucose-Capillary: 143 mg/dL — ABNORMAL HIGH (ref 70–99)
Glucose-Capillary: 110 mg/dL — ABNORMAL HIGH (ref 70–99)
Glucose-Capillary: 122 mg/dL — ABNORMAL HIGH (ref 70–99)

## 2023-05-30 NOTE — Progress Notes (Signed)
Occupational Therapy Treatment Patient Details Name: William Webb [facility] MRN: [id number] DOB: April 21, 1951 Today's Date: 05/30/2023   History of present illness 72 y.o. male presents to [facility] hospital on 05/20/2023 with chills and diarrhea, found to have fever in ED. Pt admitted for infection of unknown origin. PMH includes ESRD, PAF, HTN, HLD, CAD, PAD, DMII.   OT comments  Pt supine in bed with HOB elevated and lunch present but untouched upon OT arrival. Pt agreeable to participation in skilled OT session to address grooming and self-feeding from bed level, but deferred bed mobility, OOB, and therapeutic exercises this day due to fatigue. OT educated pt regarding role of skilled rehab services and importance of OOB activity with pt verbalizing understanding, but continuing to decline participation outside of bed level. Pt currently demonstrates ability to complete self-feeding and grooming tasks from bed level with HOB elevated with Set up and max cues for orientation to tray/items and cues for initiation. Pt also demonstrates need for occasional cues to maintain attention to task. Pt is making slow progress toward OT goals. Pt participated well from bed level but limited this session by fatigue and self-limiting behavior of deferring further activity during OT session. Pt will benefit from continued acute skilled OT services to address deficits, decrease caregiver burden, and increase safety and independence with ADLs, bed mobility during/in preparation for functional tasks, and functional transfers. Post acute discharge, pt will benefit from intensive inpatient rehab services < 3 hours per day to maximize rehab potential.    Recommendations for follow up therapy are one component of a multi-disciplinary discharge planning process, led by the attending physician.  Recommendations may be updated based on patient status, additional functional criteria and insurance authorization.    Assistance Recommended  at Discharge Frequent or constant Supervision/Assistance  Patient can return home with the following  Two people to help with walking and/or transfers;Two people to help with bathing/dressing/bathroom;Assistance with cooking/housework;Assistance with feeding;Direct supervision/assist for medications management;Direct supervision/assist for financial management;Assist for transportation;Help with stairs or ramp for entrance (Set up, orientation to tray, and initiation cues needed for self feeding)   Equipment Recommendations  Other (comment) (Defer to next level of care)    Recommendations for Other Services      Precautions / Restrictions Precautions Precautions: Fall Precaution Comments: on/off fevers, b/b incontinence Restrictions Weight Bearing Restrictions: No       Mobility Bed Mobility               General bed mobility comments: Pt repositioned trunk into midline this session with Mod assist. Pt deferred further bed mobility this session secondary to fatigue.    Transfers                   General transfer comment: Pt deferred this session secondary to fatigue.     Balance                                           ADL either performed or assessed with clinical judgement   ADL Overall ADL's : Needs assistance/impaired Eating/Feeding: Set up;Bed level (with HOB elevated) Eating/Feeding Details (indicate cue type and reason): Lunch tray in front of pt untouched upon OT arrival. Pt demonstrates ability to self-feed with Set up and currently requires orientation to tray and initation cue to begin self feeding. Grooming: Oral care;Set up;Bed level;Cueing for sequencing (with HOB elevated) Grooming  Details (indicate cue type and reason): Cues for orientation to supplies and initiation                               General ADL Comments: Pt deferred further ADLs this session secondary to fatigue and wanting to rest after lunch.     Extremity/Trunk Assessment Upper Extremity Assessment Upper Extremity Assessment: Generalized weakness   Lower Extremity Assessment Lower Extremity Assessment: Defer to PT evaluation        Vision       Perception     Praxis      Cognition Arousal/Alertness: Awake/alert Behavior During Therapy: Flat affect, WFL for tasks assessed/performed Overall Cognitive Status: No family/caregiver present to determine baseline cognitive functioning                                 General Comments: AAOx4. Pt requires increased time for processing and demonstrates ability to follow 1 step commands with increased time and occasional cues. Pt presents with Sustained attention and Emergent awareness this day.        Exercises      Shoulder Instructions       General Comments VSS on RA throughout session.    Pertinent Vitals/ Pain       Pain Assessment Pain Assessment: No/denies pain Pain Intervention(s): Monitored during session  Home Living                                          Prior Functioning/Environment              Frequency  Min 2X/week        Progress Toward Goals  OT Goals(current goals can now be found in the care plan section)  Progress towards OT goals: Progressing toward goals  Acute Rehab OT Goals Patient Stated Goal: To rest  Plan Discharge plan remains appropriate    Co-evaluation                 AM-PAC OT "6 Clicks" Daily Activity     Outcome Measure   Help from another person eating meals?: A Little Help from another person taking care of personal grooming?: A Little Help from another person toileting, which includes using toliet, bedpan, or urinal?: Total Help from another person bathing (including washing, rinsing, drying)?: Total Help from another person to put on and taking off regular upper body clothing?: A Lot Help from another person to put on and taking off regular lower body clothing?:  Total 6 Click Score: 11    End of Session    OT Visit Diagnosis: Muscle weakness (generalized) (M62.81);Other (comment) (Decreased activity tolerance)   Activity Tolerance Patient limited by fatigue   Patient Left in bed;with call bell/phone within reach;with bed alarm set   Nurse Communication Mobility status;Other (comment) (Pt partcipated well in session from bed level)        Time: 4098-1191 OT Time Calculation (min): 23 min  Charges: OT General Charges $OT Visit: 1 Visit OT Treatments $Self Care/Home Management : 23-37 mins  Marelyn Rouser "Orson Eva., OTR/L, MA Acute Rehab [phone]   Lendon Colonel 05/30/2023, 2:39 PM

## 2023-05-30 NOTE — Progress Notes (Signed)
Advised by CSW that pt is for possible d/c to snf tomorrow if pt remains stable overnight. Contacted inpt HD unit to request that pt receive HD 1st shift tomorrow if possible so pt can be d/c to snf in a timely manner if stable for d/c. Will assist as needed.   Olivia Canter Renal Navigator [phone]
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
## 2023-05-30 NOTE — TOC Progression Note (Signed)
Transition of Care [facility]) - Initial/Assessment Note    Patient Details  Name: William Webb MRN: [id number] Date of Birth: Feb 27, 1951  [facility]) CM/SW Contact:    Ralene Bathe, LCSW Phone Number: 05/30/2023, 2:26 PM  Clinical Narrative:                 LCSW spoke with the patient at bedside.  The patient's sister, William Webb, was present via phone.  LCSW discussed discharge planning.  LCSW explained that the facility is unable to provide transportation to dialysis.  The patient uses [facility] for transportation to dialysis.  The sister will call to coordinate transportation once the patient is discharged. SNF updated.  TOC following.       Barriers to Discharge: Continued Medical Work up   Patient Goals and CMS Choice Patient states their goals for this hospitalization and ongoing recovery are:: To return home CMS Medicare.gov Compare Post Acute Care list provided to:: Patient Choice offered to / list presented to : Patient      Expected Discharge Plan and Services   Discharge Planning Services: CM Consult   Living arrangements for the past 2 months: Apartment                                      Prior Living Arrangements/Services Living arrangements for the past 2 months: Apartment Lives with:: Self Patient language and need for interpreter reviewed:: Yes Do you feel safe going back to the place where you live?: Yes      Need for Family Participation in Patient Care: Yes (Comment) Care giver support system in place?: Yes (comment) Current home services: DME (Cane, walker, shower seat.) Criminal Activity/Legal Involvement Pertinent to Current Situation/Hospitalization: No - Comment as needed  Activities of Daily Living Home Assistive Devices/Equipment: CBG Meter, Cane (specify quad or straight), Blood pressure cuff ADL Screening (condition at time of admission) Patient's cognitive ability adequate to safely complete daily activities?: Yes Is  the patient deaf or have difficulty hearing?: No Does the patient have difficulty seeing, even when wearing glasses/contacts?: No Does the patient have difficulty concentrating, remembering, or making decisions?: No Patient able to express need for assistance with ADLs?: Yes Does the patient have difficulty dressing or bathing?: No Independently performs ADLs?: Yes (appropriate for developmental age) Does the patient have difficulty walking or climbing stairs?: Yes Weakness of Legs: Both Weakness of Arms/Hands: None  Permission Sought/Granted Permission sought to share information with : Case Manager, Family Supports Permission granted to share information with : Yes, Verbal Permission Granted              Emotional Assessment Appearance:: Appears stated age Attitude/Demeanor/Rapport: Engaged, Gracious Affect (typically observed): Accepting, Appropriate, Calm, Hopeful, Pleasant Orientation: : Oriented to Self, Oriented to Place, Oriented to  Time, Oriented to Situation Alcohol / Substance Use: Not Applicable Psych Involvement: No (comment)  Admission diagnosis:  Elevated temperature due to infection [B99.9] Fever, unspecified fever cause [R50.9] Diarrhea, unspecified type [R19.7] Patient Active Problem List   Diagnosis Date Noted   Pressure injury of skin 05/28/2023   FUO (fever of unknown origin) 05/21/2023   Leukocytosis 05/21/2023   History of CAD (coronary artery disease) 05/21/2023   Elevated temperature due to infection 05/21/2023   Pulmonary nodules 05/21/2023   Hemorrhoids 03/22/2023   Internal hemorrhoid 03/22/2023   Bright red blood per rectum 03/21/2023   Prolonged QT interval 03/21/2023  Precordial pain    Critical limb ischemia of right lower extremity (HCC)    Acute osteomyelitis of toe, right (HCC) 03/13/2022   Osteomyelitis (HCC) 03/13/2022   Elevated troponin    SIRS (systemic inflammatory response syndrome) (HCC) 03/12/2022   ESRD (end stage renal  disease) (HCC) 10/19/2021   Community acquired pneumonia 02/01/2021   PNA (pneumonia) 02/01/2021   Hypoxia    Pleural effusion on right 12/02/2020   Pericardial effusion 11/02/2020   Loss of consciousness (HCC) 09/16/2020   Syncope and collapse 09/15/2020   CKD (chronic kidney disease), stage IV (HCC) 08/11/2020   Acute kidney injury superimposed on CKD (HCC) 07/31/2020   Hyperlipidemia associated with type 2 diabetes mellitus (HCC) 07/31/2020   Paroxysmal atrial fibrillation (HCC) 07/31/2020   Chronic disease anemia 07/31/2020   Nausea and vomiting 07/31/2020   Peripheral artery disease (HCC) 03/19/2020   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, limited to breakdown of skin (HCC) 01/08/2020   Status post vascular surgery 12/07/2019   Respiratory failure (HCC) 12/07/2019   Persistent atrial fibrillation (HCC) 07/10/2019   H/O: GI bleed 07/10/2019   CKD (chronic kidney disease) stage 5, GFR less than 15 ml/min (HCC) 06/08/2019   Erectile dysfunction 05/06/2019   Coronary artery disease involving native coronary artery of native heart without angina pectoris 04/05/2019   Acute on chronic respiratory failure with hypoxia (HCC) 12/10/2018   Healthcare-associated pneumonia 12/10/2018   Hemoptysis 12/10/2018   Sepsis (HCC) 12/10/2018   Acute respiratory failure with hypoxia (HCC) 12/10/2018   Malnutrition of moderate degree 12/02/2018   CAD (coronary artery disease) 12/01/2018   Syncope 12/01/2018   DOE (dyspnea on exertion) 11/18/2018   Hypertension associated with diabetes (HCC)    Hyperlipidemia    Diabetes mellitus with renal complications (HCC)    Noncompliance with medication regimen    Chronic pain syndrome 05/29/2018   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 [place] [address] Phone: [phone] Fax: [phone]     Social Determinants of Health (SDOH) Social History: SDOH Screenings   Food Insecurity: No Food Insecurity (05/21/2023)  Housing: Low Risk  (05/21/2023)  Transportation Needs: No Transportation Needs (03/21/2023)  Utilities: Not At Risk (05/21/2023)  Depression (PHQ2-9): Low Risk  (10/07/2020)  Tobacco Use: Medium Risk (05/21/2023)   SDOH Interventions: Transportation Interventions: Intervention Not Indicated, Inpatient TOC, Patient Resources (Friends/Family)   Readmission Risk Interventions    05/22/2023   11:05 AM 03/22/2023    4:41 PM 03/19/2022    3:43 PM  Readmission Risk Prevention Plan  Transportation Screening Complete Complete Complete  Medication Review (RN Care Manager) Referral to Pharmacy Complete Complete  PCP or Specialist appointment within 3-5 days of discharge Complete Complete Complete  HRI or Home Care Consult Complete Complete Complete  SW Recovery Care/Counseling Consult Complete Complete Complete  Palliative Care Screening Not Applicable Not Applicable Not Applicable  Skilled Nursing Facility Not Applicable Not Applicable Not Applicable
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
## 2023-05-30 NOTE — Plan of Care (Signed)
  Problem: Pain Managment: Goal: General experience of comfort will improve Outcome: Progressing   Problem: Safety: Goal: Ability to remain free from injury will improve Outcome: Progressing   

## 2023-05-30 NOTE — Plan of Care (Signed)

## 2023-05-31 ENCOUNTER — Inpatient Hospital Stay (HOSPITAL_COMMUNITY): Payer: Medicare Other

## 2023-05-31 DIAGNOSIS — R509 Fever, unspecified: Secondary | ICD-10-CM | POA: Diagnosis not present

## 2023-05-31 LAB — GLUCOSE, CAPILLARY
Glucose-Capillary: 103 mg/dL — ABNORMAL HIGH (ref 70–99)
Glucose-Capillary: 117 mg/dL — ABNORMAL HIGH (ref 70–99)
Glucose-Capillary: 106 mg/dL — ABNORMAL HIGH (ref 70–99)

## 2023-05-31 MED ORDER — HEPARIN SODIUM (PORCINE) 1000 UNIT/ML IJ SOLN
INTRAMUSCULAR | Status: AC
  Administered 2023-05-31: 5000 [IU]
  Filled 2023-05-31: qty 5

## 2023-05-31 NOTE — Progress Notes (Signed)
PROGRESS NOTE    Eddison Searls [facility]  QIO:[id number] DOB: [birth year] DOA: 05/20/2023 PCP: Irven Coe, MD    Brief Narrative:  William Webb is a 72 y.o. male with medical history significant of ESRD on HD MWF, PAF on Eliquis, HTN, HLD, CAD, PAD, insulin-dependent DM-2 and internal hemorrhoids presented to emergency department as he developed some chills and 1 episode of loose stool at home.  Patient reported he noticed some chills since last 24 hours and he has 1 episode of loose stool on Sunday night.  Work up for fever in process-- echo, CT scan un-revealing.      Assessment and Plan:  Fever of unknown origin  Has had extensive evaluation in the hospital. Physical exam unremarkable to identify any source of infection.  Left-sided AV fistula site looks healthy without any sign of infection. Negative COVID x2, NP swab negative Chest x-ray unremarkable no acute disease process initially, have repeated w/o infiltrate Patient reported loose stool and no recent antibiotic use.  CT abdomen obtained did not showed any acute intra-abdominal process and cholelithiasis. Repeat CT scan done: Several part solid right lung nodules favored to represent infectious/inflammatory etiology-- ID to send fungal studies No urine production for urinalysis  Blood culture x 2- NGTD Given empiric rocephin for 5 days with continued fever as well as 2 days of azithromycin Subsequently seen by infectious disease.  Fungal studies have been ordered.  Blastomyces antigen is pending.  Cryptococcal antigen is negative. LDH was noted to be normal.  Echocardiogram did not show any endocarditis. Patient is already on anticoagulation making venous thromboembolism less likely. Fever trend was improving but then he spiked again 201 F.  WBC is noted to be higher today.  May need to request ID to reevaluate. Liver and spleen noted to be unremarkable on recent CT scan.  No significant lymphadenopathy noted on  recent CT scan. Check ESR CRP ANA.  Drug fever was not thought to be likely per ID. Does complain of right-sided hip pain.  Will get x-rays of his pelvis and right hip.   Loose stool C. difficile screen with PCR and GI panel -negative   Asymptomatic cholelithiasis CT abdomen and pelvis showed incidental finding of cholelithiasis.  Patient denies any abdominal pain and right upper quadrant pain. -Stable hepatic panel.   Incidental finding of lung nodule CT abdomen pelvis showed incidental initial finding of solid nodules right lower and right middle lobe.  Recommended noncontrast chest CT in 3 to 6 months for follow-up. Will send ambulatory referral to pulmonology.   ESRD on HD MWF  Nephrology is following.   Essential hypertension Continue antihypertensives.   Paroxysmal atrial fibrillation Continue apixaban and carvedilol.   History of CAD History of PAD Hyperlipidemia. Not on plavix per patient, statin and Coreg 25 mg twice daily   Internal hemorrhoid Stable.  No bleeding reported recently.   Insulin dependent DM type II-well-controlled Per chart review most recent A1c 6 in May 2024 - SSI    Peripheral neuropathy - home gabapentin 300 mg twice daily   BPH - Flomax and Proscar.   GERD - Protonix 40 mg daily  Recent loss of brother- 2 months ago-- sister says he has gone down hill from then -trial of remeron at night for appetite    DVT prophylaxis: On Eliquis Code Status: Full Code Disposition Plan: SNF when medically stable.  Family communication: Left message for sister yesterday.  Will try again today.  Consultants:  renal  ID  Subjective: Complains of loose stools this morning.  Complains of right-sided hip pain.  No nausea vomiting.  Objective: Vitals:   05/31/23 0930 05/31/23 1000 05/31/23 1030 05/31/23 1100  BP: 129/73 (!) 153/56 (!) 154/64 (!) (P) 151/66  Pulse: 66 66 67   Resp: 18 14 16    Temp:      TempSrc:      SpO2: 100% 100% 100%    Weight:      Height:        Intake/Output Summary (Last 24 hours) at 05/31/2023 1103 Last data filed at 05/31/2023 0533 Gross per 24 hour  Intake --  Output 0 ml  Net 0 ml   Filed Weights   05/29/23 1220 05/29/23 1302 05/31/23 0822  Weight: 76.8 kg 78 kg 78 kg    Examination:  General appearance: Awake alert.  In no distress Resp: Clear to auscultation bilaterally.  Normal effort Cardio: S1-S2 is normal regular.  No S3-S4.  No rubs murmurs or bruit GI: Abdomen is soft.  Nontender nondistended.  Bowel sounds are present normal.  No masses organomegaly Extremities: Restricted range of motion of the right lower extremity noted.   Data Reviewed: I have personally reviewed following labs and imaging studies  CBC: Recent Labs  Lab 05/25/23 1111 05/26/23 1228 05/27/23 0251 05/29/23 0318 05/31/23 0331  WBC 20.8* 21.6* 19.3* 17.4* 18.1*  NEUTROABS 18.6* 19.5*  --   --   --   HGB 10.4* 9.9* 10.0* 10.1* 10.1*  HCT 32.8* 30.9* 31.6* 31.6* 30.9*  MCV 97.6 95.7 96.3 94.3 93.1  PLT 243 243 254 247 248   Basic Metabolic Panel: Recent Labs  Lab 05/25/23 1111 05/27/23 0251 05/28/23 0029 05/29/23 0318 05/31/23 0331  NA 138 133* 134* 134* 131*  K 3.5 3.3* 3.3* 3.9 3.9  CL 92* 97* 94* 91* 91*  CO2 26 23 25 26 25   GLUCOSE 146* 221* 199* 178* 98  BUN 31* 55* 30* 44* 39*  CREATININE 4.94* 7.42* 4.85* 6.53* 6.10*  CALCIUM 9.0 8.8* 8.6* 9.1 8.4*  PHOS  --   --  3.0  --   --    GFR: Estimated Creatinine Clearance: 11.9 mL/min (A) (by C-G formula based on SCr of 6.1 mg/dL (H)).  Liver Function Tests: Recent Labs  Lab 05/26/23 1229 05/27/23 0251 05/28/23 0029 05/28/23 0030  AST 17 16  --  15  ALT 10 10  --  11  ALKPHOS 73 69  --  72  BILITOT 0.6 0.4  --  0.5  PROT 7.0 6.9  --  7.2  ALBUMIN 2.0* 1.9* 2.1* 2.1*    CBG: Recent Labs  Lab 05/30/23 0727 05/30/23 1241 05/30/23 1708 05/30/23 2041 05/31/23 0740  GLUCAP 133* 143* 110* 122* 103*     Recent Results  (from the past 240 hour(s))  Respiratory (~20 pathogens) panel by PCR     Status: None   Collection Time: 05/24/23  4:10 PM   Specimen: Nasopharyngeal Swab; Respiratory  Result Value Ref Range Status   Adenovirus NOT DETECTED NOT DETECTED Final   Coronavirus 229E NOT DETECTED NOT DETECTED Final    Comment: (NOTE) The Coronavirus on the Respiratory Panel, DOES NOT test for the novel  Coronavirus (2019 nCoV)    Coronavirus HKU1 NOT DETECTED NOT DETECTED Final   Coronavirus NL63 NOT DETECTED NOT DETECTED Final   Coronavirus OC43 NOT DETECTED NOT DETECTED Final   Metapneumovirus NOT DETECTED NOT DETECTED Final   Rhinovirus / Enterovirus NOT DETECTED NOT DETECTED Final  Influenza A NOT DETECTED NOT DETECTED Final   Influenza B NOT DETECTED NOT DETECTED Final   Parainfluenza Virus 1 NOT DETECTED NOT DETECTED Final   Parainfluenza Virus 2 NOT DETECTED NOT DETECTED Final   Parainfluenza Virus 3 NOT DETECTED NOT DETECTED Final   Parainfluenza Virus 4 NOT DETECTED NOT DETECTED Final   Respiratory Syncytial Virus NOT DETECTED NOT DETECTED Final   Bordetella pertussis NOT DETECTED NOT DETECTED Final   Bordetella Parapertussis NOT DETECTED NOT DETECTED Final   Chlamydophila pneumoniae NOT DETECTED NOT DETECTED Final   Mycoplasma pneumoniae NOT DETECTED NOT DETECTED Final    Comment: Performed at [facility], [address] [place], [place] [address]  SARS Coronavirus 2 by RT PCR (hospital order, performed in [facility] hospital lab) *cepheid single result test* Anterior Nasal Swab     Status: None   Collection Time: 05/24/23  4:10 PM   Specimen: Anterior Nasal Swab  Result Value Ref Range Status   SARS Coronavirus 2 by RT PCR NEGATIVE NEGATIVE Final    Comment: Performed at [facility], [address] [place], [place] [address]         Radiology Studies: No results found.      Scheduled Meds:  acidophilus  2 capsule Oral Daily   apixaban  5 mg Oral BID    atorvastatin  80 mg Oral Daily   carvedilol  25 mg Oral BID   doxercalciferol  5 mcg Intravenous Q M,W,F-HD   feeding supplement (NEPRO CARB STEADY)  237 mL Oral BID BM   finasteride  5 mg Oral Daily   gabapentin  300 mg Oral BID   insulin aspart  0-6 Units Subcutaneous TID WC   isosorbide mononitrate  60 mg Oral Daily   pantoprazole  40 mg Oral Daily   sodium chloride flush  3 mL Intravenous Q12H   tamsulosin  0.4 mg Oral QHS   temazepam  7.5 mg Oral QHS   Continuous Infusions:  sodium chloride       LOS: 10 days     Osvaldo Shipper,  [facility]  05/31/2023, 11:03 AM

## 2023-05-31 NOTE — Plan of Care (Signed)
  Problem: Clinical Measurements: Goal: Ability to maintain clinical measurements within normal limits will improve Outcome: Progressing Goal: Will remain free from infection Outcome: Progressing Goal: Diagnostic test results will improve Outcome: Progressing Goal: Respiratory complications will improve Outcome: Progressing Goal: Cardiovascular complication will be avoided Outcome: Progressing   Problem: Activity: Goal: Risk for activity intolerance will decrease Outcome: Not Progressing   Problem: Nutrition: Goal: Adequate nutrition will be maintained Outcome: Not Progressing   Problem: Coping: Goal: Level of anxiety will decrease Outcome: Progressing   Problem: Elimination: Goal: Will not experience complications related to bowel motility Outcome: Progressing Goal: Will not experience complications related to urinary retention Outcome: Not Applicable   Problem: Pain Managment: Goal: General experience of comfort will improve Outcome: Progressing   Problem: Safety: Goal: Ability to remain free from injury will improve Outcome: Progressing   Problem: Skin Integrity: Goal: Risk for impaired skin integrity will decrease Outcome: Progressing

## 2023-05-31 NOTE — Progress Notes (Signed)
PT Cancellation Note  Patient Details Name: Mcihael Hinderman [facility] MRN: [id number] DOB: 13-Mar-1951   Cancelled Treatment:    Reason Eval/Treat Not Completed: (P) Fatigue/lethargy limiting ability to participate (Pt states "I won't do anything today" due to fatigue after bed bath ~1hr prior. Pt awaiting his lunch after HD in AM.) Pt allowed PTA to assist him to turn to L side and float heels for pressure relief. Will continue efforts per PT plan of care as schedule permits.   Sukhraj Esquivias  05/31/2023, 3:59 PM

## 2023-05-31 NOTE — TOC Progression Note (Signed)
Transition of Care [facility]) - Initial/Assessment Note    Patient Details  Name: William Webb MRN: [id number] Date of Birth: 1951-06-16  [facility]) CM/SW Contact:    Ralene Bathe, LCSW Phone Number: 05/31/2023, 11:11 AM  Clinical Narrative:                 LCSW informed facility and renal navigator that the patient will not discharge today.   TOC following.    Barriers to Discharge: Continued Medical Work up   Patient Goals and CMS Choice Patient states their goals for this hospitalization and ongoing recovery are:: To return home CMS Medicare.gov Compare Post Acute Care list provided to:: Patient Choice offered to / list presented to : Patient      Expected Discharge Plan and Services   Discharge Planning Services: CM Consult   Living arrangements for the past 2 months: Apartment                                      Prior Living Arrangements/Services Living arrangements for the past 2 months: Apartment Lives with:: Self Patient language and need for interpreter reviewed:: Yes Do you feel safe going back to the place where you live?: Yes      Need for Family Participation in Patient Care: Yes (Comment) Care giver support system in place?: Yes (comment) Current home services: DME (Cane, walker, shower seat.) Criminal Activity/Legal Involvement Pertinent to Current Situation/Hospitalization: No - Comment as needed  Activities of Daily Living Home Assistive Devices/Equipment: CBG Meter, Cane (specify quad or straight), Blood pressure cuff ADL Screening (condition at time of admission) Patient's cognitive ability adequate to safely complete daily activities?: Yes Is the patient deaf or have difficulty hearing?: No Does the patient have difficulty seeing, even when wearing glasses/contacts?: No Does the patient have difficulty concentrating, remembering, or making decisions?: No Patient able to express need for assistance with ADLs?:  Yes Does the patient have difficulty dressing or bathing?: No Independently performs ADLs?: Yes (appropriate for developmental age) Does the patient have difficulty walking or climbing stairs?: Yes Weakness of Legs: Both Weakness of Arms/Hands: None  Permission Sought/Granted Permission sought to share information with : Case Manager, Family Supports Permission granted to share information with : Yes, Verbal Permission Granted              Emotional Assessment Appearance:: Appears stated age Attitude/Demeanor/Rapport: Engaged, Gracious Affect (typically observed): Accepting, Appropriate, Calm, Hopeful, Pleasant Orientation: : Oriented to Self, Oriented to Place, Oriented to  Time, Oriented to Situation Alcohol / Substance Use: Not Applicable Psych Involvement: No (comment)  Admission diagnosis:  Elevated temperature due to infection [B99.9] Fever, unspecified fever cause [R50.9] Diarrhea, unspecified type [R19.7] Patient Active Problem List   Diagnosis Date Noted   Pressure injury of skin 05/28/2023   FUO (fever of unknown origin) 05/21/2023   Leukocytosis 05/21/2023   History of CAD (coronary artery disease) 05/21/2023   Elevated temperature due to infection 05/21/2023   Pulmonary nodules 05/21/2023   Hemorrhoids 03/22/2023   Internal hemorrhoid 03/22/2023   Bright red blood per rectum 03/21/2023   Prolonged QT interval 03/21/2023   Precordial pain    Critical limb ischemia of right lower extremity (HCC)    Acute osteomyelitis of toe, right (HCC) 03/13/2022   Osteomyelitis (HCC) 03/13/2022   Elevated troponin    SIRS (systemic inflammatory response syndrome) (HCC) 03/12/2022   ESRD (  end stage renal disease) (HCC) 10/19/2021   Community acquired pneumonia 02/01/2021   PNA (pneumonia) 02/01/2021   Hypoxia    Pleural effusion on right 12/02/2020   Pericardial effusion 11/02/2020   Loss of consciousness (HCC) 09/16/2020   Syncope and collapse 09/15/2020   CKD (chronic  kidney disease), stage IV (HCC) 08/11/2020   Acute kidney injury superimposed on CKD (HCC) 07/31/2020   Hyperlipidemia associated with type 2 diabetes mellitus (HCC) 07/31/2020   Paroxysmal atrial fibrillation (HCC) 07/31/2020   Chronic disease anemia 07/31/2020   Nausea and vomiting 07/31/2020   Peripheral artery disease (HCC) 03/19/2020   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, limited to breakdown of skin (HCC) 01/08/2020   Status post vascular surgery 12/07/2019   Respiratory failure (HCC) 12/07/2019   Persistent atrial fibrillation (HCC) 07/10/2019   H/O: GI bleed 07/10/2019   CKD (chronic kidney disease) stage 5, GFR less than 15 ml/min (HCC) 06/08/2019   Erectile dysfunction 05/06/2019   Coronary artery disease involving native coronary artery of native heart without angina pectoris 04/05/2019   Acute on chronic respiratory failure with hypoxia (HCC) 12/10/2018   Healthcare-associated pneumonia 12/10/2018   Hemoptysis 12/10/2018   Sepsis (HCC) 12/10/2018   Acute respiratory failure with hypoxia (HCC) 12/10/2018   Malnutrition of moderate degree 12/02/2018   CAD (coronary artery disease) 12/01/2018   Syncope 12/01/2018   DOE (dyspnea on exertion) 11/18/2018   Hypertension associated with diabetes (HCC)    Hyperlipidemia    Diabetes mellitus with renal complications (HCC)    Noncompliance with medication regimen    Chronic pain syndrome 05/29/2018   Insulin dependent type 2 diabetes mellitus (HCC) 04/07/2018   Stage 3a chronic kidney disease (HCC) 03/11/2018   Fall 11/20/2017   AKI (acute kidney injury) (HCC) 11/20/2017   Normocytic normochromic anemia 11/20/2017   Hypoglycemia 11/19/2017   Essential hypertension 11/19/2017   PCP:  Irven Coe, MD Pharmacy:   [id number] - [place], [place] - [address] [address] Phone: [phone] Fax: [phone]     Social Determinants of Health (SDOH) Social History: SDOH  Screenings   Food Insecurity: No Food Insecurity (05/21/2023)  Housing: Low Risk  (05/21/2023)  Transportation Needs: No Transportation Needs (03/21/2023)  Utilities: Not At Risk (05/21/2023)  Depression (PHQ2-9): Low Risk  (10/07/2020)  Tobacco Use: Medium Risk (05/21/2023)   SDOH Interventions: Transportation Interventions: Intervention Not Indicated, Inpatient TOC, Patient Resources (Friends/Family)   Readmission Risk Interventions    05/22/2023   11:05 AM 03/22/2023    4:41 PM 03/19/2022    3:43 PM  Readmission Risk Prevention Plan  Transportation Screening Complete Complete Complete  Medication Review (RN Care Manager) Referral to Pharmacy Complete Complete  PCP or Specialist appointment within 3-5 days of discharge Complete Complete Complete  HRI or Home Care Consult Complete Complete Complete  SW Recovery Care/Counseling Consult Complete Complete Complete  Palliative Care Screening Not Applicable Not Applicable Not Applicable  Skilled Nursing Facility Not Applicable Not Applicable Not Applicable

## 2023-05-31 NOTE — Progress Notes (Signed)
Subjective:  On hd , feels better , no diarrhea BUT Febrile still   Objective Vital signs in last 24 hours: Vitals:   05/30/23 1711 05/30/23 2039 05/31/23 0533 05/31/23 0822  BP: (!) 175/73 (!) 181/72 (!) 165/60 (!) 150/54  Pulse: 72 74 72 69  Resp:  16 16 18   Temp: (!) 101.1 F (38.4 C) 99.6 F (37.6 C) (!) 100.9 F (38.3 C) 100.2 F (37.9 C)  TempSrc: Oral     SpO2: 98% 95% 95% 100%  Weight:    78 kg  Height:       Weight change:   Physical Exam: General: Alert Elderly male  , NAD Heart: RRR 1/6 SEM Lungs: CTA anteriorly nonlabored breathing Abdomen: NABS soft NT ND Extremities: No pedal edema Dialysis Access: LUA AVF patent on hd     OP Dialysis Orders: MWF at AF 4hr, 425/800, EDW 82.5kg, 2K/2Ca bath, AVF, heparin 5000 unit bolus - Last HD 7/22, left below EDW (80.5kg) - dry weight has been lowered multiple times - Hectoral IV q HD - no ESA   Problem/Plan: 1. Fever/myalgias + diarrhea:  ID / admit wu  /probable viral syndrome still . 2d echo ok   and  Abd CT  abd/ chest / pelvis showed pulm nodules bilaterally and most < 1cm , WBC has been high 18.1  this am . .had been on  empiric ceftriaxone. Now off . 2. ESRD: Continue HD on MWF schedule -  HD today    k 3.9 .  Prior hypokalemia  3. HTN/volume: BP up slightly, improved this am with hd ,continue home BP meds Carved 25 mg bid, imdur 50mg   every day for now. No edema. Hold Lasix.  Well below EDW.  1 L UF today  diet changed to carb mod   with  low k and phos  4. Anemia: Hgb 10.1- monitor without ESA for now. 5. Secondary hyperparathyroidism: Ca ok, Phos lower  (3.0 )(not eating well) - off binders. Continue VDRA. 6. Nutrition: Alb low 2.1 , continue supps as tolerated - although he reports that Nepro often causes diarrhea..  changed diet to carb  mod healthy heart with fluid restrictions, low phos ,  low k  monitor labs 7. Paroxysmal A-fib: On Eliquis. 8. Incidental cholelithiasis and lung nodules on imaging ->  needs outpatient f/u 9. Dispo: Suspect will need SNF - lives alone.  Patient realizes   Lenny Pastel, PA-C [facility] Beeper [phone] 05/31/2023,8:33 AM  LOS: 10 days   Labs: Basic Metabolic Panel: Recent Labs  Lab 05/28/23 0029 05/29/23 0318 05/31/23 0331  NA 134* 134* 131*  K 3.3* 3.9 3.9  CL 94* 91* 91*  CO2 25 26 25   GLUCOSE 199* 178* 98  BUN 30* 44* 39*  CREATININE 4.85* 6.53* 6.10*  CALCIUM 8.6* 9.1 8.4*  PHOS 3.0  --   --    Liver Function Tests: Recent Labs  Lab 05/26/23 1229 05/27/23 0251 05/28/23 0029 05/28/23 0030  AST 17 16  --  15  ALT 10 10  --  11  ALKPHOS 73 69  --  72  BILITOT 0.6 0.4  --  0.5  PROT 7.0 6.9  --  7.2  ALBUMIN 2.0* 1.9* 2.1* 2.1*   No results for input(s): "LIPASE", "AMYLASE" in the last 168 hours. No results for input(s): "AMMONIA" in the last 168 hours. CBC: Recent Labs  Lab 05/25/23 1111 05/26/23 1228 05/27/23 0251 05/29/23 0318 05/31/23 0331  WBC 20.8*  21.6* 19.3* 17.4* 18.1*  NEUTROABS 18.6* 19.5*  --   --   --   HGB 10.4* 9.9* 10.0* 10.1* 10.1*  HCT 32.8* 30.9* 31.6* 31.6* 30.9*  MCV 97.6 95.7 96.3 94.3 93.1  PLT 243 243 254 247 248   Cardiac Enzymes: No results for input(s): "CKTOTAL", "CKMB", "CKMBINDEX", "TROPONINI" in the last 168 hours. CBG: Recent Labs  Lab 05/30/23 0727 05/30/23 1241 05/30/23 1708 05/30/23 2041 05/31/23 0740  GLUCAP 133* 143* 110* 122* 103*    Studies/Results: No results found. Medications:  sodium chloride      acidophilus  2 capsule Oral Daily   apixaban  5 mg Oral BID   atorvastatin  80 mg Oral Daily   carvedilol  25 mg Oral BID   doxercalciferol  5 mcg Intravenous Q M,W,F-HD   feeding supplement (NEPRO CARB STEADY)  237 mL Oral BID BM   finasteride  5 mg Oral Daily   gabapentin  300 mg Oral BID   insulin aspart  0-6 Units Subcutaneous TID WC   isosorbide mononitrate  60 mg Oral Daily   pantoprazole  40 mg Oral Daily   sodium chloride flush  3 mL  Intravenous Q12H   tamsulosin  0.4 mg Oral QHS   temazepam  7.5 mg Oral QHS

## 2023-05-31 NOTE — Progress Notes (Signed)
   05/31/23 1245  Vitals  Pulse Rate 71  Resp 17  BP (!) 145/79  SpO2 99 %  Post Treatment  Dialyzer Clearance Lightly streaked  Duration of HD Treatment -hour(s) 4 hour(s)  Liters Processed 96  Fluid Removed (mL) 1000 mL  Tolerated HD Treatment Yes  AVG/AVF Arterial Site Held (minutes) 10 minutes  AVG/AVF Venous Site Held (minutes) 10 minutes   Received patient in bed to unit.  Alert and oriented.  Informed consent signed and in chart.   TX duration:4hrs  Patient tolerated well.  Transported back to the room  Alert, without acute distress.  Hand-off given to patient's nurse.   Access used: avf Access issues: none  Total UF removed: 1L Medication(s) given: immodium, hecterol    William Webb  [facility]

## 2023-06-01 ENCOUNTER — Inpatient Hospital Stay (HOSPITAL_COMMUNITY): Payer: Medicare Other

## 2023-06-01 DIAGNOSIS — R509 Fever, unspecified: Secondary | ICD-10-CM | POA: Diagnosis not present

## 2023-06-01 LAB — GLUCOSE, CAPILLARY
Glucose-Capillary: 115 mg/dL — ABNORMAL HIGH (ref 70–99)
Glucose-Capillary: 140 mg/dL — ABNORMAL HIGH (ref 70–99)
Glucose-Capillary: 134 mg/dL — ABNORMAL HIGH (ref 70–99)
Glucose-Capillary: 150 mg/dL — ABNORMAL HIGH (ref 70–99)
Glucose-Capillary: 136 mg/dL — ABNORMAL HIGH (ref 70–99)

## 2023-06-01 MED ORDER — VANCOMYCIN HCL 750 MG/150ML IV SOLN
750.0000 mg | INTRAVENOUS | Status: DC
  Filled 2023-06-01: qty 150

## 2023-06-01 MED ORDER — PROSOURCE PLUS PO LIQD
30.0000 mL | Freq: Two times a day (BID) | ORAL | Status: DC
  Administered 2023-06-01 – 2023-06-10 (×9): 30 mL via ORAL
  Filled 2023-06-01 (×13): qty 30

## 2023-06-01 MED ORDER — PIPERACILLIN-TAZOBACTAM IN DEX 2-0.25 GM/50ML IV SOLN
2.2500 g | Freq: Three times a day (TID) | INTRAVENOUS | Status: DC
  Administered 2023-06-01 – 2023-06-03 (×7): 2.25 g via INTRAVENOUS
  Filled 2023-06-01 (×10): qty 50

## 2023-06-01 MED ORDER — RENA-VITE PO TABS
1.0000 | ORAL_TABLET | Freq: Every day | ORAL | Status: DC
  Administered 2023-06-01 – 2023-06-10 (×9): 1 via ORAL
  Filled 2023-06-01 (×10): qty 1

## 2023-06-01 MED ORDER — VANCOMYCIN HCL 1750 MG/350ML IV SOLN
1750.0000 mg | Freq: Once | INTRAVENOUS | Status: AC
  Administered 2023-06-01: 1750 mg via INTRAVENOUS
  Filled 2023-06-01: qty 350

## 2023-06-01 MED ORDER — AMLODIPINE BESYLATE 5 MG PO TABS
5.0000 mg | ORAL_TABLET | Freq: Every day | ORAL | Status: DC
  Administered 2023-06-01 – 2023-06-09 (×7): 5 mg via ORAL
  Filled 2023-06-01 (×7): qty 1

## 2023-06-01 MED ORDER — FUROSEMIDE 40 MG PO TABS
80.0000 mg | ORAL_TABLET | ORAL | Status: DC
  Administered 2023-06-04 – 2023-06-11 (×4): 80 mg via ORAL
  Filled 2023-06-01 (×5): qty 2

## 2023-06-01 MED ORDER — DOXERCALCIFEROL 4 MCG/2ML IV SOLN
4.0000 ug | INTRAVENOUS | Status: DC
  Administered 2023-06-08 – 2023-06-10 (×2): 4 ug via INTRAVENOUS
  Filled 2023-06-01 (×7): qty 2

## 2023-06-01 MED ORDER — CLINDAMYCIN PHOSPHATE 600 MG/50ML IV SOLN
600.0000 mg | Freq: Three times a day (TID) | INTRAVENOUS | Status: DC
  Administered 2023-06-01 – 2023-06-02 (×3): 600 mg via INTRAVENOUS
  Filled 2023-06-01 (×5): qty 50

## 2023-06-01 MED ORDER — MIRTAZAPINE 15 MG PO TBDP
15.0000 mg | ORAL_TABLET | Freq: Every day | ORAL | Status: DC
  Administered 2023-06-01 – 2023-06-10 (×9): 15 mg via ORAL
  Filled 2023-06-01 (×11): qty 1

## 2023-06-01 NOTE — Progress Notes (Addendum)
Pharmacy Antibiotic Note  William Webb is a 72 y.o. male admitted on 05/20/2023 with  sepsis .  Pharmacy has been consulted for vancomycin and Zosyn dosing. Of note, patient is on IHD MWF, next session 06/03/23.  Plan: Vancomycin 1750mg  x1 loading dose Vancomycin 750 mg supplementary dose after HD session MWF Zosyn 2.25g IV q8h after HD Monitor renal function, clinical status, cultures, and vanc levels as clinically indicated  Height: 6\' 3"  (190.5 cm) Weight: 78 kg (171 lb 15.3 oz) (bed) IBW/kg (Calculated) : 84.5  Temp (24hrs), Avg:98.9 F (37.2 C), Min:98.1 F (36.7 C), Max:99.3 F (37.4 C)  Recent Labs  Lab 05/26/23 1228 05/27/23 0251 05/28/23 0029 05/29/23 0318 05/31/23 0331 06/01/23 0047  WBC 21.6* 19.3*  --  17.4* 18.1* 16.4*  CREATININE  --  7.42* 4.85* 6.53* 6.10*  --     Estimated Creatinine Clearance: 11.9 mL/min (A) (by C-G formula based on SCr of 6.1 mg/dL (H)).    No Known Allergies  Antimicrobials this admission: CTX 7/22 >> 7/28 Azithro 7/27 >> 7/28 Clindamycin 8/3 >>  Vancomycin 8/3 >>  Zosyn 8/3 >>  Dose adjustments this admission: none  Microbiology results: 7/22 BCx: neg 7/23 C.Diff: neg 7/23 GI Panel: neg 7/23 MRSA PCR: neg 8/3 BCx: pending  Thank you for allowing pharmacy to be a part of this patient's care.  Romie Minus, PharmD PGY1 Pharmacy Resident  Please check AMION for all [facility] Pharmacy phone numbers After 10:00 PM, call Main Pharmacy [phone]

## 2023-06-01 NOTE — Progress Notes (Signed)
[facility] Progress Note   Subjective: Seen in room. He has declined since I last saw him in OP center. New wound L heel. He is losing wt. Doesn't really comment on how he feels other than feeling tired. Not eating.   Objective Vitals:   05/31/23 1719 05/31/23 1954 06/01/23 0437 06/01/23 0839  BP: (!) 180/64 (!) 178/62 (!) 175/58 (!) 177/64  Pulse: 73 72 71 71  Resp:   20 18  Temp: 98.1 F (36.7 C) 99.3 F (37.4 C) 99.3 F (37.4 C) 98.8 F (37.1 C)  TempSrc:  Oral  Oral  SpO2: 96% 97%  98%  Weight:      Height:       Physical Exam General: Chronically ill appearing older male in NAD Neuro: Oriented X 3 Heart: S1,S2, RRR SR on monitor.  Lungs: CTAB Abdomen:NABS, flat Extremities: Wound L heel. Trace pedal edema Dialysis Access: L AVF + TB :  Additional Objective Labs: Basic Metabolic Panel: Recent Labs  Lab 05/28/23 0029 05/29/23 0318 05/31/23 0331  NA 134* 134* 131*  K 3.3* 3.9 3.9  CL 94* 91* 91*  CO2 25 26 25   GLUCOSE 199* 178* 98  BUN 30* 44* 39*  CREATININE 4.85* 6.53* 6.10*  CALCIUM 8.6* 9.1 8.4*  PHOS 3.0  --   --    Liver Function Tests: Recent Labs  Lab 05/26/23 1229 05/27/23 0251 05/28/23 0029 05/28/23 0030  AST 17 16  --  15  ALT 10 10  --  11  ALKPHOS 73 69  --  72  BILITOT 0.6 0.4  --  0.5  PROT 7.0 6.9  --  7.2  ALBUMIN 2.0* 1.9* 2.1* 2.1*   No results for input(s): "LIPASE", "AMYLASE" in the last 168 hours. CBC: Recent Labs  Lab 05/25/23 1111 05/26/23 1228 05/27/23 0251 05/29/23 0318 05/31/23 0331 06/01/23 0047  WBC 20.8* 21.6* 19.3* 17.4* 18.1* 16.4*  NEUTROABS 18.6* 19.5*  --   --   --   --   HGB 10.4* 9.9* 10.0* 10.1* 10.1* 10.5*  HCT 32.8* 30.9* 31.6* 31.6* 30.9* 32.5*  MCV 97.6 95.7 96.3 94.3 93.1 94.8  PLT 243 243 254 247 248 247   Blood Culture    Component Value Date/Time   SDES BLOOD SITE NOT SPECIFIED 05/20/2023 2229   SPECREQUEST  05/20/2023 2229    BOTTLES DRAWN AEROBIC AND ANAEROBIC Blood  Culture adequate volume   CULT  05/20/2023 2229    NO GROWTH 5 DAYS Performed at [facility], [address], [place], [place] [address]    REPTSTATUS 05/25/2023 FINAL 05/20/2023 2229    Cardiac Enzymes: No results for input(s): "CKTOTAL", "CKMB", "CKMBINDEX", "TROPONINI" in the last 168 hours. CBG: Recent Labs  Lab 05/31/23 0740 05/31/23 1722 05/31/23 2125 06/01/23 0625 06/01/23 0731  GLUCAP 103* 117* 106* 115* 140*   Iron Studies: No results for input(s): "IRON", "TIBC", "TRANSFERRIN", "FERRITIN" in the last 72 hours. @lablastinr3 @ Studies/Results: DG HIP PORT UNILAT WITH PELVIS 1V RIGHT  Result Date: 05/31/2023 CLINICAL DATA:  Pain. EXAM: DG HIP (WITH OR WITHOUT PELVIS) 1V PORT RIGHT COMPARISON:  Reformats from abdominopelvic CT 05/28/2023 FINDINGS: The hip joint spaces preserved. Minor acetabular spurring and subchondral cystic change. Femoral head is well seated in the acetabulum. No fracture. No dislocation. Intact pubic rami. Benign-appearing sclerotic lesion in the right iliac bone. This is unchanged in appearance from 09/28/2021 abdominopelvic CT and likely benign. No suspicious bone lesion. The pubic symphysis and sacroiliac joints are  congruent. IMPRESSION: Minor degenerative change of the right hip. Electronically Signed   By: Narda Rutherford M.D.   On: 05/31/2023 16:06   Medications:  sodium chloride      acidophilus  2 capsule Oral Daily   apixaban  5 mg Oral BID   atorvastatin  80 mg Oral Daily   carvedilol  25 mg Oral BID   doxercalciferol  5 mcg Intravenous Q M,W,F-HD   feeding supplement (NEPRO CARB STEADY)  237 mL Oral BID BM   finasteride  5 mg Oral Daily   gabapentin  300 mg Oral BID   insulin aspart  0-6 Units Subcutaneous TID WC   isosorbide mononitrate  60 mg Oral Daily   pantoprazole  40 mg Oral Daily   sodium chloride flush  3 mL Intravenous Q12H   tamsulosin  0.4 mg Oral QHS   temazepam  7.5 mg Oral QHS   HD orders: AF MWF 4 hrs 180NRE  425/800 82.5 kg 2.0 K/ 2.0 Ca AVF - Heparin 5000 units IV three times per week - Hectorol 5 mcg IV TIW  Assessment/Plan: 1. FUO-W/U per primary.  2. ESRD -MWF Next HD 06/03/2023 3. Anemia - HGB 10.5  No ESA needed. Follow HGB 4. Secondary hyperparathyroidism - Correct calcium on high side. Decrease VDRA. PO4 at goal.  5. HTN/volume - BP is historically labile. High at present. Add Amlodipine 5 mg PO daily. Add Furosemide 80 mg PO on non HD days. Now under OP EDW by weights. Get standing wts with HD. UF as tolerated.  6. Nutrition - Low albumin. Add protein supplements 7. PAF-continue Eliquis/Carvedilol 8. DMT2- per primary 9. BPH-per primary 10. FFT-Brother died around early 03-27-2023. He has declined since his brother's death. Not eating. Liberalize diet.  11. Wound L heel-WCC   H.  NP-C 06/01/2023, 9:12 AM  [employer] [phone]

## 2023-06-01 NOTE — Progress Notes (Addendum)
PROGRESS NOTE    William Webb [facility]  XBJ:[id number] DOB: December 14, 1950 DOA: 05/20/2023 PCP: Irven Coe, MD    Brief Narrative:  William Webb is a 72 y.o. male with medical history significant of ESRD on HD MWF, PAF on Eliquis, HTN, HLD, CAD, PAD, insulin-dependent DM-2 and internal hemorrhoids presented to emergency department as he developed some chills and 1 episode of loose stool at home.  Patient reported he noticed some chills since last 24 hours and he has 1 episode of loose stool on Sunday night.  Work up for fever in process-- echo, CT scan un-revealing.      Assessment and Plan:  Fever of unknown origin  Has had extensive evaluation in the hospital. Physical exam unremarkable to identify any source of infection.  Left-sided AV fistula site looks healthy without any sign of infection. Negative COVID x2, NP swab negative Chest x-ray unremarkable no acute disease process initially, have repeated w/o infiltrate Patient reported loose stool and no recent antibiotic use.  CT abdomen obtained did not showed any acute intra-abdominal process and cholelithiasis. CT scan was repeated: Several part solid right lung nodules favored to represent infectious/inflammatory etiology.  Fungal studies were sent. Blastomyces antigen is pending.  Cryptococcal antigen is negative. No urine production for urinalysis  Blood culture x 2- NGTD Given empiric rocephin for 5 days with continued fever as well as 2 days of azithromycin Subsequently seen by infectious disease.  Fungal studies have been ordered as mentioned above. LDH was noted to be normal.  Echocardiogram did not show any endocarditis. Patient is already on anticoagulation making venous thromboembolism less likely. Liver and spleen noted to be unremarkable on recent CT scan.  No significant lymphadenopathy noted on recent CT scan. Fever trend was improving but then spiked again on 8/1.  WBC was noted to be higher.  Discharge was  canceled.  Once again fever trend appears to be improving.  WBC is noted to be better today.   CRP and ESR noted to be elevated.  Clinical significance unclear.  ANA is pending.  May need further autoimmune workup if fever does not subside.  May need to [facility] infectious disease if fever recurs.   X-ray of his right hip did not show any acute findings.    Left Foot Heel Wound A heel wound was noted this AM. X ray was done which raised concern for soft tissue necrotizing infection.  Contacted Dr. Ralene Cork with podiatry who deferred to orthopedics due to concern for necrotizing infection.  Discussed with Dr. Lajoyce Corners and Dr. Dion Saucier.  MRI ankle has been ordered stat.  Patient will be kept NPO.  Depending on the findings of the MRI he might need to go to surgery this evening.  Patient is at risk for losing the foot.  This was communicated to the patient, his niece and his sister who was available over the phone.  Broad-spectrum antibiotics ordered including vancomycin Zosyn and clindamycin. Blood Cultures. Keep NPO. MRI reviewed with Dr. Dion Saucier. Patient is hemodynamically stable and afebrile. No tracking of gas up the foot. Ortho plans to evaluate early in the AM since there doesn't seem to be an emergent condition at this time. Niece Shanda Bumps informed.   Loose stool C. difficile screen with PCR and GI panel -negative.  Imodium as needed.   Asymptomatic cholelithiasis CT abdomen and pelvis showed incidental finding of cholelithiasis.  Patient denies any abdominal pain and right upper quadrant pain. -Stable hepatic panel.   Incidental finding of lung nodule  CT abdomen pelvis showed incidental initial finding of solid nodules right lower and right middle lobe.  Recommended noncontrast chest CT in 3 to 6 months for follow-up. Ambulatory referral was sent to pulmonology.   ESRD on HD MWF  Nephrology is following.   Essential hypertension Continue antihypertensives.   Paroxysmal atrial  fibrillation Continue apixaban and carvedilol.   History of CAD History of PAD Hyperlipidemia. Not on plavix per patient.  Noted to be on atorvastatin and Coreg 25 mg twice daily   Internal hemorrhoid Stable.  No bleeding reported recently.   Insulin dependent DM type II-well-controlled Per chart review most recent A1c 6 in May 2024 - SSI    Peripheral neuropathy - home gabapentin 300 mg twice daily   BPH - Flomax and Proscar.   GERD - Protonix 40 mg daily  Recent loss of brother- 2 months ago-- sister says he has gone down hill from then Trial of Remeron.  Will discontinue temazepam as he has noted to be sedated this morning.   DVT prophylaxis: On Eliquis Code Status: Full Code Disposition Plan: SNF when medically stable.  Family communication: Left message for sister yesterday.  Will try again today.  Consultants:  renal  ID   Subjective: Not very talkative this morning.  Denies any pain issues.  Continues to have loose stool occasionally.  Objective: Vitals:   05/31/23 1954 06/01/23 0437 06/01/23 0839 06/01/23 1010  BP: (!) 178/62 (!) 175/58 (!) 177/64 (!) 180/69  Pulse: 72 71 71 71  Resp:  20 18   Temp: 99.3 F (37.4 C) 99.3 F (37.4 C) 98.8 F (37.1 C)   TempSrc: Oral  Oral   SpO2: 97%  98%   Weight:      Height:        Intake/Output Summary (Last 24 hours) at 06/01/2023 1047 Last data filed at 06/01/2023 0600 Gross per 24 hour  Intake 100 ml  Output 1000 ml  Net -900 ml   Filed Weights   05/29/23 1220 05/29/23 1302 05/31/23 0822  Weight: 76.8 kg 78 kg 78 kg    Examination:  General appearance: Somnolent but easily arousable. Resp: Clear to auscultation bilaterally.  Normal effort Cardio: S1-S2 is normal regular.  No S3-S4.  No rubs murmurs or bruit GI: Abdomen is soft.  Nontender nondistended.  Bowel sounds are present normal.  No masses organomegaly Discolored wound noted in left heel with foul smell. No active drainage noted.    Data  Reviewed: I have personally reviewed following labs and imaging studies  CBC: Recent Labs  Lab 05/25/23 1111 05/26/23 1228 05/27/23 0251 05/29/23 0318 05/31/23 0331 06/01/23 0047  WBC 20.8* 21.6* 19.3* 17.4* 18.1* 16.4*  NEUTROABS 18.6* 19.5*  --   --   --   --   HGB 10.4* 9.9* 10.0* 10.1* 10.1* 10.5*  HCT 32.8* 30.9* 31.6* 31.6* 30.9* 32.5*  MCV 97.6 95.7 96.3 94.3 93.1 94.8  PLT 243 243 254 247 248 247   Basic Metabolic Panel: Recent Labs  Lab 05/25/23 1111 05/27/23 0251 05/28/23 0029 05/29/23 0318 05/31/23 0331  NA 138 133* 134* 134* 131*  K 3.5 3.3* 3.3* 3.9 3.9  CL 92* 97* 94* 91* 91*  CO2 26 23 25 26 25   GLUCOSE 146* 221* 199* 178* 98  BUN 31* 55* 30* 44* 39*  CREATININE 4.94* 7.42* 4.85* 6.53* 6.10*  CALCIUM 9.0 8.8* 8.6* 9.1 8.4*  PHOS  --   --  3.0  --   --  GFR: Estimated Creatinine Clearance: 11.9 mL/min (A) (by C-G formula based on SCr of 6.1 mg/dL (H)).  Liver Function Tests: Recent Labs  Lab 05/26/23 1229 05/27/23 0251 05/28/23 0029 05/28/23 0030  AST 17 16  --  15  ALT 10 10  --  11  ALKPHOS 73 69  --  72  BILITOT 0.6 0.4  --  0.5  PROT 7.0 6.9  --  7.2  ALBUMIN 2.0* 1.9* 2.1* 2.1*    CBG: Recent Labs  Lab 05/31/23 0740 05/31/23 1722 05/31/23 2125 06/01/23 0625 06/01/23 0731  GLUCAP 103* 117* 106* 115* 140*     Recent Results (from the past 240 hour(s))  Respiratory (~20 pathogens) panel by PCR     Status: None   Collection Time: 05/24/23  4:10 PM   Specimen: Nasopharyngeal Swab; Respiratory  Result Value Ref Range Status   Adenovirus NOT DETECTED NOT DETECTED Final   Coronavirus 229E NOT DETECTED NOT DETECTED Final    Comment: (NOTE) The Coronavirus on the Respiratory Panel, DOES NOT test for the novel  Coronavirus (2019 nCoV)    Coronavirus HKU1 NOT DETECTED NOT DETECTED Final   Coronavirus NL63 NOT DETECTED NOT DETECTED Final   Coronavirus OC43 NOT DETECTED NOT DETECTED Final   Metapneumovirus NOT DETECTED NOT  DETECTED Final   Rhinovirus / Enterovirus NOT DETECTED NOT DETECTED Final   Influenza A NOT DETECTED NOT DETECTED Final   Influenza B NOT DETECTED NOT DETECTED Final   Parainfluenza Virus 1 NOT DETECTED NOT DETECTED Final   Parainfluenza Virus 2 NOT DETECTED NOT DETECTED Final   Parainfluenza Virus 3 NOT DETECTED NOT DETECTED Final   Parainfluenza Virus 4 NOT DETECTED NOT DETECTED Final   Respiratory Syncytial Virus NOT DETECTED NOT DETECTED Final   Bordetella pertussis NOT DETECTED NOT DETECTED Final   Bordetella Parapertussis NOT DETECTED NOT DETECTED Final   Chlamydophila pneumoniae NOT DETECTED NOT DETECTED Final   Mycoplasma pneumoniae NOT DETECTED NOT DETECTED Final    Comment: Performed at [facility], [address] [place], [place] [address]  SARS Coronavirus 2 by RT PCR (hospital order, performed in [facility] hospital lab) *cepheid single result test* Anterior Nasal Swab     Status: None   Collection Time: 05/24/23  4:10 PM   Specimen: Anterior Nasal Swab  Result Value Ref Range Status   SARS Coronavirus 2 by RT PCR NEGATIVE NEGATIVE Final    Comment: Performed at [facility], [address], [place], [place] [address]         Radiology Studies: DG HIP PORT UNILAT WITH PELVIS 1V RIGHT  Result Date: 05/31/2023 CLINICAL DATA:  Pain. EXAM: DG HIP (WITH OR WITHOUT PELVIS) 1V PORT RIGHT COMPARISON:  Reformats from abdominopelvic CT 05/28/2023 FINDINGS: The hip joint spaces preserved. Minor acetabular spurring and subchondral cystic change. Femoral head is well seated in the acetabulum. No fracture. No dislocation. Intact pubic rami. Benign-appearing sclerotic lesion in the right iliac bone. This is unchanged in appearance from 09/28/2021 abdominopelvic CT and likely benign. No suspicious bone lesion. The pubic symphysis and sacroiliac joints are congruent. IMPRESSION: Minor degenerative change of the right hip. Electronically Signed   By: Narda Rutherford M.D.   On:  05/31/2023 16:06        Scheduled Meds:  (feeding supplement) PROSource Plus  30 mL Oral BID BM   acidophilus  2 capsule Oral Daily   amLODipine  5 mg Oral Daily   apixaban  5 mg Oral BID  atorvastatin  80 mg Oral Daily   carvedilol  25 mg Oral BID   [START ON 06/03/2023] doxercalciferol  4 mcg Intravenous Q M,W,F-HD   feeding supplement (NEPRO CARB STEADY)  237 mL Oral BID BM   finasteride  5 mg Oral Daily   [START ON 06/02/2023] furosemide  80 mg Oral Once per day on Sunday Tuesday Thursday Saturday   gabapentin  300 mg Oral BID   insulin aspart  0-6 Units Subcutaneous TID WC   isosorbide mononitrate  60 mg Oral Daily   multivitamin  1 tablet Oral QHS   pantoprazole  40 mg Oral Daily   sodium chloride flush  3 mL Intravenous Q12H   tamsulosin  0.4 mg Oral QHS   temazepam  7.5 mg Oral QHS   Continuous Infusions:  sodium chloride       LOS: 11 days     Osvaldo Shipper,  [facility]  06/01/2023, 10:47 AM
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
## 2023-06-01 NOTE — Consult Note (Signed)
WOC Nurse Consult Note: Reason for Consult:left  foot with evidence of PVD, numerous other comorbid conditions, discoloration at plantar heel, peeling and weeping of lateral foot and at malleolus.  Podiatric medicine (Dr. Ralene Cork) and Orthopedics have been simultaneously consulted. Dr. Lajoyce Corners is to see in consultation early next week. Orthopedics is now following. Wound type: Left heel infection, possible osteomyelitis Pressure Injury POA: N/A See photodocumentation provided to EMR today by Orthopedic PA-C. Dressing procedure/placement/frequency: I have provided Nursing with guidance for offloading of foot and topical antimicrobial wound care to the left foot lesions using a soap and water cleanse followed by application of povidone-iodine swabsticks and allowing the solution to air dry. When dry, a dry ABD dressing is to be applied and secured with a few turns of Kerlix roll gauze/paper tape. The foot is to be placed into a Prevalon boot. A sacral foam is to be placed for PI prevention and turning and repositioning to minimize time in the supine position implemented.  WOC nursing team will not follow, but will remain available to this patient, the nursing and medical teams.  Please re-consult if needed.  Thank you for inviting [place] to participate in this patient's Plan of Care.  Ladona Mow, MSN, RN, CNS, GNP, Leda Min, [employer], [employer] phone:  [phone]

## 2023-06-01 NOTE — Plan of Care (Signed)
  Problem: Health Behavior/Discharge Planning: Goal: Ability to manage health-related needs will improve Outcome: Progressing   

## 2023-06-02 DIAGNOSIS — B999 Unspecified infectious disease: Secondary | ICD-10-CM | POA: Diagnosis not present

## 2023-06-02 LAB — GLUCOSE, CAPILLARY
Glucose-Capillary: 113 mg/dL — ABNORMAL HIGH (ref 70–99)
Glucose-Capillary: 113 mg/dL — ABNORMAL HIGH (ref 70–99)
Glucose-Capillary: 122 mg/dL — ABNORMAL HIGH (ref 70–99)
Glucose-Capillary: 132 mg/dL — ABNORMAL HIGH (ref 70–99)

## 2023-06-02 LAB — SURGICAL PCR SCREEN
MRSA, PCR: NEGATIVE
Staphylococcus aureus: NEGATIVE

## 2023-06-02 MED ORDER — CHLORHEXIDINE GLUCONATE CLOTH 2 % EX PADS: 6.0000 | MEDICATED_PAD | Freq: Every day | CUTANEOUS | Status: DC

## 2023-06-02 NOTE — Progress Notes (Signed)
[facility] Progress Note   Subjective: Family at bedside. Tentative plan for L BKA 06/05/2023 per Dr. Lajoyce Corners. Patient is lethargic, not talking very much today. Denies pain/SOB.      Objective Vitals:   06/01/23 1658 06/01/23 2226 06/02/23 0609 06/02/23 0808  BP: 130/63 135/64 (!) 162/64 (!) 166/73  Pulse: 72 67 71 71  Resp: 19 20 17 16   Temp: 98.7 F (37.1 C) 99.3 F (37.4 C) 99.2 F (37.3 C) 99 F (37.2 C)  TempSrc: Oral Oral Oral   SpO2: 98% 97% 96% 94%  Weight:      Height:       Physical Exam General: Chronically ill appearing older male in NAD Neuro: Oriented X 3 Heart: S1,S2, RRR SR on monitor.  Lungs: CTAB Abdomen:NABS, flat Extremities: Foul smelling Wound L heel. Trace pedal edema Dialysis Access: L AVF + TB  Additional Objective Labs: Basic Metabolic Panel: Recent Labs  Lab 05/28/23 0029 05/29/23 0318 05/31/23 0331  NA 134* 134* 131*  K 3.3* 3.9 3.9  CL 94* 91* 91*  CO2 25 26 25   GLUCOSE 199* 178* 98  BUN 30* 44* 39*  CREATININE 4.85* 6.53* 6.10*  CALCIUM 8.6* 9.1 8.4*  PHOS 3.0  --   --    Liver Function Tests: Recent Labs  Lab 05/26/23 1229 05/27/23 0251 05/28/23 0029 05/28/23 0030  AST 17 16  --  15  ALT 10 10  --  11  ALKPHOS 73 69  --  72  BILITOT 0.6 0.4  --  0.5  PROT 7.0 6.9  --  7.2  ALBUMIN 2.0* 1.9* 2.1* 2.1*   No results for input(s): "LIPASE", "AMYLASE" in the last 168 hours. CBC: Recent Labs  Lab 05/26/23 1228 05/27/23 0251 05/29/23 0318 05/31/23 0331 06/01/23 0047  WBC 21.6* 19.3* 17.4* 18.1* 16.4*  NEUTROABS 19.5*  --   --   --   --   HGB 9.9* 10.0* 10.1* 10.1* 10.5*  HCT 30.9* 31.6* 31.6* 30.9* 32.5*  MCV 95.7 96.3 94.3 93.1 94.8  PLT 243 254 247 248 247   Blood Culture    Component Value Date/Time   SDES BLOOD BLOOD RIGHT ARM 06/01/2023 1651   SPECREQUEST  06/01/2023 1651    BOTTLES DRAWN AEROBIC ONLY Blood Culture adequate volume   CULT  06/01/2023 1651    NO GROWTH < 24 HOURS Performed  at [facility], [address] [place], [place] [address]    REPTSTATUS PENDING 06/01/2023 1651    Cardiac Enzymes: No results for input(s): "CKTOTAL", "CKMB", "CKMBINDEX", "TROPONINI" in the last 168 hours. CBG: Recent Labs  Lab 06/01/23 1136 06/01/23 1701 06/01/23 2005 06/02/23 0717 06/02/23 1110  GLUCAP 134* 150* 136* 113* 113*   Iron Studies: No results for input(s): "IRON", "TIBC", "TRANSFERRIN", "FERRITIN" in the last 72 hours. @lablastinr3 @ Studies/Results: MR ANKLE LEFT WO CONTRAST  Result Date: 06/01/2023 CLINICAL DATA:  Necrotizing infection. EXAM: MRI OF THE LEFT ANKLE WITHOUT CONTRAST TECHNIQUE: Multiplanar, multisequence MR imaging of the ankle was performed. No intravenous contrast was administered. The technologist reports the patient was premedicated prior to this test. During the MRI examination he pulled his foot out of the coil. His foot was repositioned in the MRI was restarted. Later in the MRI the patient fall this fat again. The test was ended. COMPARISON:  Left foot radiographs 06/01/2023 FINDINGS: Despite efforts by the technologist and patient, moderate to high-grade motion artifact is present on today's exam and could not be eliminated.  This reduces exam sensitivity and specificity. TENDONS Peroneal: The peroneus longus and brevis tendons are grossly intact. Posteromedial: The tibialis posterior, flexor digitorum longus, and flexor hallucis longus tendons are grossly intact. Anterior: The tibialis anterior, extensor hallucis longus, and extensor digitorum longus tendons are grossly intact. Achilles: Minimal intermediate T2 signal distal Achilles tendinosis. Plantar Fascia: Intermediate T2 signal and mild thickening suggesting mild plantar fasciitis. LIGAMENTS Lateral: Not well evaluated given high-grade motion artifact. There appears to be attenuation of the anterior talofibular ligament. Medial: The tibiotalar deep deltoid and tibial spring ligaments are  grossly intact. CARTILAGE Ankle Joint: No high-grade cartilage defect is seen. Subtalar Joints/Sinus Tarsi: Mild-to-moderate middle subtalar cartilage thinning.Mild edema within the sinus tarsi. Bones: There is a soft tissue defect within the plantar heel at the level of the posteroinferior aspect of the calcaneal tuberosity. There is scattered decreased T1 and decreased T2 signal with blooming artifact indicative of the subcutaneous air seen on today's radiographs plantar to the calcaneus, tracking along the plantar fascia, and within the abductor hallucis longus muscle plantar and medial to the great toe metatarsal head. This is again concerning for a gas producing infectious organism and possible necrotizing soft tissue infection. Within the limitations of this modality and patient motion, no definitive air is seen tracking proximal to the calcaneus into the calf. There is high-grade marrow edema within the posteroinferior aspect of the calcaneus with likely mild cortical thinning (sagittal series 5, image 11) concerning for acute osteomyelitis extending throughout the posterior calcaneal tuberosity through the inferior aspect of the anterior process of the calcaneus. Other: None. IMPRESSION: 1. Unfortunately this examination is technically limited by moderate to high-grade patient motion artifact. 2. There is a soft tissue defect within the plantar heel at the level of the posteroinferior aspect of the calcaneal tuberosity. There is scattered soft tissue air as seen on today's radiographs, plantar to the calcaneus, tracking along the plantar fascia, and within the abductor hallucis longus muscle plantar and medial to the great toe metatarsal head. This is again concerning for a gas producing infectious organism and possible necrotizing soft tissue infection. Within the limitations of this modality and patient motion, no definitive air is seen tracking proximal to the calcaneus into the calf. 3. There is  high-grade marrow edema within the posteroinferior aspect of the calcaneus with likely mild cortical erosion concerning for acute osteomyelitis. Electronically Signed   By: Neita Garnet M.D.   On: 06/01/2023 18:44   DG Foot 2 Views Left  Result Date: 06/01/2023 CLINICAL DATA:  Nonhealing open wound of heel. EXAM: LEFT FOOT - 2 VIEW COMPARISON:  None Available. FINDINGS: Soft tissue defect about the plantar aspect of the calcaneus. Patchy soft tissue gas tracks into the plantar soft tissues about the hind and midfoot. There is question of diminished cortex involving the subjacent plantar calcaneal cortex. No other sites suspicious for osteomyelitis. Hallux valgus and hammertoe deformity of the digits. IMPRESSION: 1. Soft tissue defect about the plantar aspect of the calcaneus with patchy soft tissue gas tracking into the plantar soft tissues of the mid and hindfoot. The amount of soft tissue gas raises concern for necrotizing soft tissue infection. 2. Question of diminished cortex involving the plantar calcaneal cortex subjacent to soft tissue defect, suspicious for osteomyelitis. These results will be called to the ordering clinician or representative by the Radiologist Assistant, and communication documented in the PACS or [employer]. Electronically Signed   By: Narda Rutherford M.D.   On: 06/01/2023 15:40  DG HIP PORT UNILAT WITH PELVIS 1V RIGHT  Result Date: 05/31/2023 CLINICAL DATA:  Pain. EXAM: DG HIP (WITH OR WITHOUT PELVIS) 1V PORT RIGHT COMPARISON:  Reformats from abdominopelvic CT 05/28/2023 FINDINGS: The hip joint spaces preserved. Minor acetabular spurring and subchondral cystic change. Femoral head is well seated in the acetabulum. No fracture. No dislocation. Intact pubic rami. Benign-appearing sclerotic lesion in the right iliac bone. This is unchanged in appearance from 09/28/2021 abdominopelvic CT and likely benign. No suspicious bone lesion. The pubic symphysis and sacroiliac joints  are congruent. IMPRESSION: Minor degenerative change of the right hip. Electronically Signed   By: Narda Rutherford M.D.   On: 05/31/2023 16:06   Medications:  sodium chloride     clindamycin (CLEOCIN) IV 600 mg (06/02/23 0534)   piperacillin-tazobactam (ZOSYN)  IV 2.25 g (06/02/23 0540)   [START ON 06/03/2023] vancomycin      (feeding supplement) PROSource Plus  30 mL Oral BID BM   acidophilus  2 capsule Oral Daily   amLODipine  5 mg Oral Daily   apixaban  5 mg Oral BID   atorvastatin  80 mg Oral Daily   carvedilol  25 mg Oral BID   [START ON 06/03/2023] doxercalciferol  4 mcg Intravenous Q M,W,F-HD   feeding supplement (NEPRO CARB STEADY)  237 mL Oral BID BM   finasteride  5 mg Oral Daily   furosemide  80 mg Oral Once per day on Sunday Tuesday Thursday Saturday   gabapentin  300 mg Oral BID   insulin aspart  0-6 Units Subcutaneous TID WC   isosorbide mononitrate  60 mg Oral Daily   mirtazapine  15 mg Oral QHS   multivitamin  1 tablet Oral QHS   pantoprazole  40 mg Oral Daily   sodium chloride flush  3 mL Intravenous Q12H   tamsulosin  0.4 mg Oral QHS     HD orders: AF MWF 4 hrs 180NRE 425/800 82.5 kg 2.0 K/ 2.0 Ca AVF - Heparin 5000 units IV three times per week - Hectorol 5 mcg IV TIW   Assessment/Plan: 1. L heel wound: Plan for L BKA 06/05/2023 per Dr. Lajoyce Corners. ABX per primary.  2. ESRD -MWF Next HD 06/03/2023 3. Anemia - HGB 10.5  No ESA needed. Follow HGB 4. Secondary hyperparathyroidism - Correct calcium on high side. Decrease VDRA. PO4 at goal.  5. HTN/volume - BP is historically labile. High at present. Add Amlodipine 5 mg PO daily. Add Furosemide 80 mg PO on non HD days. Now under OP EDW by weights. Get standing wts with HD. UF as tolerated.  6. Nutrition - Low albumin. Add protein supplements 7. PAF-continue Eliquis/Carvedilol 8. DMT2- per primary 9. BPH-per primary 10. FFT-Brother died around early 16-Mar-2023. He has declined since his brother's death. Not eating.  Liberalize diet.     H.  NP-C 06/02/2023, 11:47 AM  [employer] [phone]

## 2023-06-02 NOTE — Progress Notes (Signed)
[facility]  RUE:[id number] DOB: 02/17/1951 DOA: 05/20/2023 PCP: Irven Coe, MD    Brief Narrative:  72 year old with a history of ESRD on HD MWF, PAF on Eliquis, HTN, HLD, CAD, PAD, and DM2 who presented to the ER 7/22 with chills and an episode of loose stool at home.  Initial workup to include TTE and CT scans did not reveal a clear source for his fever.  Goals of Care:   Code Status: Full Code   DVT prophylaxis: SCDs Start: 05/21/23 0155 apixaban (ELIQUIS) tablet 5 mg   Interim Hx: The patient refused to allow blood work to be drawn this morning.  Tmax over last 24 hours 99.3.  Blood pressure modestly elevated.  Affect is flat but the patient is alert and conversant.  He has no new complaints today.  He denies shortness of breath or chest pain.  Assessment & Plan:  Left heel wound First appreciated 8/3 AM -plain film x-ray suggested possibility of soft tissue necrotizing infection -podiatry was contacted and deferred to orthopedics -MRI ankle ordered as stat -antibiotic coverage broadened -fortunately MRI did not suggest necrotizing fasciitis -Ortho to follow-up for further planning and discussion with family  Fever of unknown origin - now explained by above Has undergone an extensive inpatient evaluation -left AV fistula healthy without any signs of infection -negative COVID x 2 -CXR unrevealing -CT abdomen without evidence of acute intra-abdominal process -CT chest revealed some scattered right lung nodules possibly representing infectious or inflammatory etiology with fungal workup underway -blood cultures negative x 2 -was dosed with empiric Rocephin for 5 days without significant change to persistent fever -ID has been consulted -TTE without evidence of gross endocarditis -no significant lymphadenopathy noted on CT scanning -plain x-rays of the right hip without acute findings -ultimately the patient was discovered to have a left heel wound which likely represents the  source of his fever  Loose stool C. difficile screen with PCR and GI panel both negative -possibly simply an inflammatory reaction to his illness and fever -Imodium as needed  Asymptomatic cholelithiasis Noted incidentally on CT abdomen and pelvis -LFTs stable  Incidental finding of lung nodules Noted during CT abdomen and pelvis to include solid nodules of the right lower and right middle lobe -follow-up noncontrast CT chest is recommended in 3-6 months for routine follow-up with ambulatory referral sent to Pulmonary  ESRD on HD MWF Nephrology following  HTN Controlled at present  PAF Continue chronic apixaban and Coreg  History of CAD and history of PAD  DM2 A1c 6.0 May 2024  Peripheral neuropathy Continue usual Neurontin dose  BPH Continue usual Flomax and Proscar  Situational depression The patient suffered the death of his brother approximately 2 months ago and family reports that he has been in a state of decline since -a trial of Remeron has been initiated during this hospital stay  Family Communication: Spoke with multiple family members at bedside Disposition: Will depend upon postoperative course -suspect SNF rehab stay will be indicated   Objective: Blood pressure (!) 166/73, pulse 71, temperature 99 F (37.2 C), resp. rate 16, height 6\' 3"  (1.905 m), weight 78 kg, SpO2 94%.  Intake/Output Summary (Last 24 hours) at 06/02/2023 0840 Last data filed at 06/02/2023 0600 Gross per 24 hour  Intake 623.69 ml  Output 0 ml  Net 623.69 ml   Filed Weights   05/29/23 1220 05/29/23 1302 05/31/23 0822  Weight: 76.8 kg 78 kg 78 kg    Examination: General: No acute  respiratory distress Lungs: Clear to auscultation bilaterally without wheeze Cardiovascular: Regular rate and rhythm without murmur Abdomen: Nontender, nondistended, soft, bowel sounds positive, no rebound Extremities: No significant edema bilateral lower extremities  CBC: Recent Labs  Lab  05/26/23 1228 05/27/23 0251 05/29/23 0318 05/31/23 0331 06/01/23 0047  WBC 21.6*   < > 17.4* 18.1* 16.4*  NEUTROABS 19.5*  --   --   --   --   HGB 9.9*   < > 10.1* 10.1* 10.5*  HCT 30.9*   < > 31.6* 30.9* 32.5*  MCV 95.7   < > 94.3 93.1 94.8  PLT 243   < > 247 248 247   < > = values in this interval not displayed.   Basic Metabolic Panel: Recent Labs  Lab 05/28/23 0029 05/29/23 0318 05/31/23 0331  NA 134* 134* 131*  K 3.3* 3.9 3.9  CL 94* 91* 91*  CO2 25 26 25   GLUCOSE 199* 178* 98  BUN 30* 44* 39*  CREATININE 4.85* 6.53* 6.10*  CALCIUM 8.6* 9.1 8.4*  PHOS 3.0  --   --    GFR: Estimated Creatinine Clearance: 11.9 mL/min (A) (by C-G formula based on SCr of 6.1 mg/dL (H)).   Scheduled Meds:  (feeding supplement) PROSource Plus  30 mL Oral BID BM   acidophilus  2 capsule Oral Daily   amLODipine  5 mg Oral Daily   apixaban  5 mg Oral BID   atorvastatin  80 mg Oral Daily   carvedilol  25 mg Oral BID   [START ON 06/03/2023] doxercalciferol  4 mcg Intravenous Q M,W,F-HD   feeding supplement (NEPRO CARB STEADY)  237 mL Oral BID BM   finasteride  5 mg Oral Daily   furosemide  80 mg Oral Once per day on Sunday Tuesday Thursday Saturday   gabapentin  300 mg Oral BID   insulin aspart  0-6 Units Subcutaneous TID WC   isosorbide mononitrate  60 mg Oral Daily   mirtazapine  15 mg Oral QHS   multivitamin  1 tablet Oral QHS   pantoprazole  40 mg Oral Daily   sodium chloride flush  3 mL Intravenous Q12H   tamsulosin  0.4 mg Oral QHS   Continuous Infusions:  sodium chloride     clindamycin (CLEOCIN) IV 600 mg (06/02/23 0534)   piperacillin-tazobactam (ZOSYN)  IV 2.25 g (06/02/23 0540)   [START ON 06/03/2023] vancomycin       LOS: 12 days   Lonia Blood, MD [facility] Office  [phone] Pager - Text Page per Loretha Stapler  If 7PM-7AM, please contact night-coverage per Amion 06/02/2023, 8:40 AM

## 2023-06-02 NOTE — Plan of Care (Signed)
  Problem: Health Behavior/Discharge Planning: Goal: Ability to manage health-related needs will improve Outcome: Progressing   

## 2023-06-02 NOTE — Progress Notes (Signed)
Refused blood draw. Unable to convince. Will try in a later time.

## 2023-06-02 NOTE — Progress Notes (Signed)
     Subjective:  Patient denies pain, doing okay.  Objective:   VITALS:   Vitals:   06/01/23 1658 06/01/23 2226 06/02/23 0609 06/02/23 0808  BP: 130/63 135/64 (!) 162/64 (!) 166/73  Pulse: 72 67 71 71  Resp: 19 20 17 16   Temp: 98.7 F (37.1 C) 99.3 F (37.4 C) 99.2 F (37.3 C) 99 F (37.2 C)  TempSrc: Oral Oral Oral   SpO2: 98% 97% 96% 94%  Weight:      Height:        Left lower extremity in protective heel boot with Mepilex dressings, these were removed, wounds examined, foul smelling heel ulcer and foot wounds, but no ascending cellulitis or crepitance that I can appreciate.  Lab Results  Component Value Date   WBC 16.4 (H) 06/01/2023   HGB 10.5 (L) 06/01/2023   HCT 32.5 (L) 06/01/2023   MCV 94.8 06/01/2023   PLT 247 06/01/2023   BMET    Component Value Date/Time   NA 131 (L) 05/31/2023 0331   NA 140 09/22/2019 1507   K 3.9 05/31/2023 0331   CL 91 (L) 05/31/2023 0331   CO2 25 05/31/2023 0331   GLUCOSE 98 05/31/2023 0331   BUN 39 (H) 05/31/2023 0331   BUN 27 09/22/2019 1507   CREATININE 6.10 (H) 05/31/2023 0331   CREATININE 2.90 (H) 01/06/2020 1430   CALCIUM 8.4 (L) 05/31/2023 0331   CALCIUM 8.0 (L) 05/21/2023 0804   GFRNONAA 9 (L) 05/31/2023 0331   GFRNONAA 21 (L) 01/06/2020 1430     Assessment/Plan:     Principal Problem:   Elevated temperature due to infection Active Problems:   Essential hypertension   Insulin dependent type 2 diabetes mellitus (HCC)   Peripheral artery disease (HCC)   Paroxysmal atrial fibrillation (HCC)   Chronic disease anemia   ESRD (end stage renal disease) (HCC)   Internal hemorrhoid   FUO (fever of unknown origin)   Leukocytosis   History of CAD (coronary artery disease)   Pulmonary nodules   Pressure injury of skin   Left heel osteomyelitis  I do not see evidence for necrotizing fasciitis.  Plan for antibiotics, and Dr. Aldean Baker to evaluate tomorrow, definitive intervention with below-knee amputation  likely Wednesday per Dr. Lajoyce Corners.  I have had a family conference and reviewed the details, examined the foot, and put the plan together with the family.   Eulas Post 06/02/2023, 9:32 AM Cell [phone]

## 2023-06-02 NOTE — Progress Notes (Signed)
Since it is not necrotizing fasciitis, ok to stop clinda per Dr Sharon Seller.  Ulyses Southward, PharmD, BCIDP, AAHIVP, CPP Infectious Disease Pharmacist 06/02/2023 1:07 PM

## 2023-06-03 ENCOUNTER — Encounter (HOSPITAL_COMMUNITY): Payer: Medicare Other

## 2023-06-03 DIAGNOSIS — B999 Unspecified infectious disease: Secondary | ICD-10-CM | POA: Diagnosis not present

## 2023-06-03 LAB — CBC
HCT: 29.9 % — ABNORMAL LOW (ref 39.0–52.0)
Hemoglobin: 9.7 g/dL — ABNORMAL LOW (ref 13.0–17.0)
MCH: 29.7 pg (ref 26.0–34.0)
MCHC: 32.4 g/dL (ref 30.0–36.0)
MCV: 91.4 fL (ref 80.0–100.0)
Platelets: 246 10*3/uL (ref 150–400)
RBC: 3.27 MIL/uL — ABNORMAL LOW (ref 4.22–5.81)
RDW: 14.7 % (ref 11.5–15.5)
WBC: 16.7 10*3/uL — ABNORMAL HIGH (ref 4.0–10.5)
nRBC: 0 % (ref 0.0–0.2)

## 2023-06-03 LAB — RENAL FUNCTION PANEL
Albumin: 1.8 g/dL — ABNORMAL LOW (ref 3.5–5.0)
Anion gap: 18 — ABNORMAL HIGH (ref 5–15)
BUN: 59 mg/dL — ABNORMAL HIGH (ref 8–23)
CO2: 20 mmol/L — ABNORMAL LOW (ref 22–32)
Calcium: 8.3 mg/dL — ABNORMAL LOW (ref 8.9–10.3)
Chloride: 92 mmol/L — ABNORMAL LOW (ref 98–111)
Creatinine, Ser: 7.56 mg/dL — ABNORMAL HIGH (ref 0.61–1.24)
GFR, Estimated: 7 mL/min — ABNORMAL LOW (ref >60)
Glucose, Bld: 177 mg/dL — ABNORMAL HIGH (ref 70–99)
Phosphorus: 7.4 mg/dL — ABNORMAL HIGH (ref 2.5–4.6)
Potassium: 4.4 mmol/L (ref 3.5–5.1)
Sodium: 130 mmol/L — ABNORMAL LOW (ref 135–145)

## 2023-06-03 LAB — GLUCOSE, CAPILLARY
Glucose-Capillary: 125 mg/dL — ABNORMAL HIGH (ref 70–99)
Glucose-Capillary: 156 mg/dL — ABNORMAL HIGH (ref 70–99)
Glucose-Capillary: 99 mg/dL (ref 70–99)

## 2023-06-03 MED ORDER — HEPARIN SODIUM (PORCINE) 1000 UNIT/ML DIALYSIS: 1000.0000 [IU] | INTRAMUSCULAR | Status: DC | PRN

## 2023-06-03 MED ORDER — HEPARIN SODIUM (PORCINE) 1000 UNIT/ML DIALYSIS
5000.0000 [IU] | Freq: Once | INTRAMUSCULAR | Status: AC
  Administered 2023-06-03: 5000 [IU] via INTRAVENOUS_CENTRAL

## 2023-06-03 MED ORDER — PIPERACILLIN-TAZOBACTAM IN DEX 2-0.25 GM/50ML IV SOLN
2.2500 g | Freq: Three times a day (TID) | INTRAVENOUS | Status: AC
  Administered 2023-06-04 – 2023-06-09 (×16): 2.25 g via INTRAVENOUS
  Filled 2023-06-03 (×16): qty 50

## 2023-06-03 MED ORDER — PENTAFLUOROPROP-TETRAFLUOROETH EX AERO: 1.0000 | INHALATION_SPRAY | CUTANEOUS | Status: DC | PRN

## 2023-06-03 MED ORDER — LIDOCAINE HCL (PF) 1 % IJ SOLN: 5.0000 mL | INTRAMUSCULAR | Status: DC | PRN

## 2023-06-03 MED ORDER — LIDOCAINE-PRILOCAINE 2.5-2.5 % EX CREA: 1.0000 | TOPICAL_CREAM | CUTANEOUS | Status: DC | PRN

## 2023-06-03 NOTE — Progress Notes (Deleted)
   06/03/23 1700  Vitals  Pulse Rate 74  Resp 19  BP (!) 139/58  SpO2 96 %  O2 Device Room Air  Weight  (unable to obtain)  Type of Weight Post-Dialysis  Oxygen Therapy  Patient Activity (if Appropriate) In bed  Pulse Oximetry Type Continuous  Oximetry Probe Site Changed No  Post Treatment  Dialyzer Clearance Lightly streaked  Duration of HD Treatment -hour(s) 1.39 hour(s)  Hemodialysis Intake (mL) 0 mL  Liters Processed 47.8  Fluid Removed (mL) 400 mL  Tolerated HD Treatment Yes  Post-Hemodialysis Comments pt signed himself off early---ama form signed--dx to be made aware---  AVG/AVF Arterial Site Held (minutes) 10 minutes  AVG/AVF Venous Site Held (minutes) 10 minutes     06/03/23 1700  Vitals  Pulse Rate 74  Resp 19  BP (!) 139/58  SpO2 96 %  O2 Device Room Air  Weight  (unable to obtain)  Type of Weight Post-Dialysis  Oxygen Therapy  Patient Activity (if Appropriate) In bed  Pulse Oximetry Type Continuous  Oximetry Probe Site Changed No  Post Treatment  Dialyzer Clearance Lightly streaked  Duration of HD Treatment -hour(s) 1.39 hour(s)  Hemodialysis Intake (mL) 0 mL  Liters Processed 47.8  Fluid Removed (mL) 400 mL  Tolerated HD Treatment Yes  Post-Hemodialysis Comments pt signed himself off early---ama form signed--dx to be made aware---  AVG/AVF Arterial Site Held (minutes) 10 minutes  AVG/AVF Venous Site Held (minutes) 10 minutes     06/03/23 1700  Vitals  Pulse Rate 74  Resp 19  BP (!) 139/58  SpO2 96 %  O2 Device Room Air  Weight  (unable to obtain)  Type of Weight Post-Dialysis  Oxygen Therapy  Patient Activity (if Appropriate) In bed  Pulse Oximetry Type Continuous  Oximetry Probe Site Changed No  Post Treatment  Dialyzer Clearance Lightly streaked  Duration of HD Treatment -hour(s) 1.39 hour(s)  Hemodialysis Intake (mL) 0 mL  Liters Processed 47.8  Fluid Removed (mL) 400 mL  Tolerated HD Treatment Yes  Post-Hemodialysis Comments pt  signed himself off early---ama form signed--dx to be made aware---  AVG/AVF Arterial Site Held (minutes) 10 minutes  AVG/AVF Venous Site Held (minutes) 10 minutes   Received patient in bed to unit.  Alert and oriented.  Informed consent signed and in chart.   TX duration:1.39  Pt signed himself off ama---states no to continuing---dx aware  Transported back to the room  Alert, without acute distress.  Hand-off given to patient's nurse.   Access used: thigh graft Access issues: no complications  Total UF removed: 400cc Medication(s) given: none---primary rn to give the hectoral iv dose for today   [facility]

## 2023-06-03 NOTE — Progress Notes (Signed)
[facility]  ZOX:[id number] DOB: November 17, 1950 DOA: 05/20/2023 PCP: Irven Coe, MD    Brief Narrative:  72 year old with a history of ESRD on HD MWF, PAF on Eliquis, HTN, HLD, CAD, PAD, and DM2 who presented to the ER 7/22 with chills and an episode of loose stool at home.  Initial workup to include TTE and CT scans did not reveal a clear source for his fever.  Goals of Care:   Code Status: Full Code   DVT prophylaxis: SCDs Start: 05/21/23 0155 apixaban (ELIQUIS) tablet 5 mg   Interim Hx: No acute events recorded overnight.  Tmax 99.0 over the last 24 hours.  Vital signs otherwise stable.  Denies any new complaints.  Reports good appetite.  Assessment & Plan:  Left heel wound First appreciated 8/3 AM -plain film x-ray suggested possibility of soft tissue necrotizing infection - Podiatry was contacted and deferred to Orthopedics - antibiotic coverage broadened - fortunately MRI did not suggest necrotizing fasciitis - Ortho to follow-up for further planning and discussion with family, with amputation planned for later this week  Fever of unknown origin - now explained by above Has undergone an extensive inpatient evaluation -left AV fistula healthy without any signs of infection -negative COVID x 2 -CXR unrevealing -CT abdomen without evidence of acute intra-abdominal process -CT chest revealed some scattered right lung nodules possibly representing infectious or inflammatory etiology with fungal workup underway -blood cultures negative x 2 -was dosed with empiric Rocephin for 5 days without significant change to persistent fever -ID has been consulted -TTE without evidence of gross endocarditis -no significant lymphadenopathy noted on CT scanning -plain x-rays of the right hip without acute findings -ultimately the patient was discovered to have a left heel wound which likely represents the source of his fever  Loose stool C. difficile screen with PCR and GI panel both negative  -possibly simply an inflammatory reaction to his illness and fever -Imodium as needed  Asymptomatic cholelithiasis Noted incidentally on CT abdomen and pelvis -LFTs stable  Incidental finding of lung nodules Noted during CT abdomen and pelvis to include solid nodules of the right lower and right middle lobe - follow-up noncontrast CT chest is recommended in 3-6 months for routine follow-up with ambulatory referral sent to Pulmonary  ESRD on HD MWF Nephrology following  HTN Reasonably controlled at present - monitor post-HD to determine if further titration of meds possible   PAF Continue chronic apixaban and Coreg  History of CAD and history of PAD  DM2 A1c 6.0 May 2024 -CBG currently well-controlled as inpatient  Peripheral neuropathy Continue usual Neurontin dose  BPH Continue usual Flomax and Proscar  Situational depression The patient suffered the death of his brother approximately 2 months ago and family reports that he has been in a state of decline since -a trial of Remeron has been initiated during this hospital stay  Family Communication: Spoke with multiple family members at bedside Disposition: Will depend upon postoperative course -suspect SNF rehab stay will be indicated   Objective: Blood pressure (!) 151/63, pulse 69, temperature 99 F (37.2 C), temperature source Axillary, resp. rate 16, height 6\' 3"  (1.905 m), weight 77.1 kg, SpO2 95%.  Intake/Output Summary (Last 24 hours) at 06/03/2023 0858 Last data filed at 06/03/2023 0602 Gross per 24 hour  Intake 718.06 ml  Output 0 ml  Net 718.06 ml   Filed Weights   05/29/23 1302 05/31/23 0822 06/03/23 0521  Weight: 78 kg 78 kg 77.1 kg  Examination: General: No acute respiratory distress Lungs: Clear to auscultation bilaterally -no crackles or wheezing Cardiovascular: Regular rate and rhythm without murmur Abdomen: Nontender, nondistended, soft, bowel sounds positive, no rebound Extremities: No  significant edema bilateral lower extremities  CBC: Recent Labs  Lab 05/29/23 0318 05/31/23 0331 06/01/23 0047  WBC 17.4* 18.1* 16.4*  HGB 10.1* 10.1* 10.5*  HCT 31.6* 30.9* 32.5*  MCV 94.3 93.1 94.8  PLT 247 248 247   Basic Metabolic Panel: Recent Labs  Lab 05/28/23 0029 05/29/23 0318 05/31/23 0331  NA 134* 134* 131*  K 3.3* 3.9 3.9  CL 94* 91* 91*  CO2 25 26 25   GLUCOSE 199* 178* 98  BUN 30* 44* 39*  CREATININE 4.85* 6.53* 6.10*  CALCIUM 8.6* 9.1 8.4*  PHOS 3.0  --   --    GFR: Estimated Creatinine Clearance: 11.8 mL/min (A) (by C-G formula based on SCr of 6.1 mg/dL (H)).   Scheduled Meds:  (feeding supplement) PROSource Plus  30 mL Oral BID BM   acidophilus  2 capsule Oral Daily   amLODipine  5 mg Oral Daily   apixaban  5 mg Oral BID   atorvastatin  80 mg Oral Daily   carvedilol  25 mg Oral BID   doxercalciferol  4 mcg Intravenous Q M,W,F-HD   feeding supplement (NEPRO CARB STEADY)  237 mL Oral BID BM   finasteride  5 mg Oral Daily   furosemide  80 mg Oral Once per day on Sunday Tuesday Thursday Saturday   gabapentin  300 mg Oral BID   insulin aspart  0-6 Units Subcutaneous TID WC   isosorbide mononitrate  60 mg Oral Daily   mirtazapine  15 mg Oral QHS   multivitamin  1 tablet Oral QHS   pantoprazole  40 mg Oral Daily   sodium chloride flush  3 mL Intravenous Q12H   tamsulosin  0.4 mg Oral QHS   Continuous Infusions:  sodium chloride     piperacillin-tazobactam (ZOSYN)  IV 2.25 g (06/03/23 0602)   vancomycin       LOS: 13 days   Lonia Blood, MD [facility] Office  [phone] Pager - Text Page per Loretha Stapler  If 7PM-7AM, please contact night-coverage per Amion 06/03/2023, 8:58 AM

## 2023-06-03 NOTE — Progress Notes (Signed)
[facility] Progress Note   Subjective:    Seen and examined patient at bedside. Family members also at bedside. Patient is sleeping but wakes up for a short period when name is called. Appears comfortable and not in respiratory distress. Plan for hemodialysis today.  Objective Vitals:   06/03/23 0219 06/03/23 0427 06/03/23 0521 06/03/23 0921  BP:  (!) 151/63  138/65  Pulse:  69  73  Resp: 18 16  18   Temp: 98.6 F (37 C) 99 F (37.2 C)  99.5 F (37.5 C)  TempSrc: Oral Axillary    SpO2:  95%  97%  Weight:   77.1 kg   Height:       Physical Exam General: Chronically ill appearing older male in NAD Neuro: Oriented X 3 Heart: S1,S2, RRR SR on monitor.  Lungs: CTAB Abdomen:NABS, flat Extremities: Foul smelling Wound L heel. Trace pedal edema Dialysis Access: L AVF + TB  Filed Weights   05/29/23 1302 05/31/23 0822 06/03/23 0521  Weight: 78 kg 78 kg 77.1 kg    Intake/Output Summary (Last 24 hours) at 06/03/2023 1338 Last data filed at 06/03/2023 0602 Gross per 24 hour  Intake 718.06 ml  Output 0 ml  Net 718.06 ml    Additional Objective Labs: Basic Metabolic Panel: Recent Labs  Lab 05/28/23 0029 05/29/23 0318 05/31/23 0331 06/03/23 1126  NA 134* 134* 131* 130*  K 3.3* 3.9 3.9 4.4  CL 94* 91* 91* 92*  CO2 25 26 25  20*  GLUCOSE 199* 178* 98 177*  BUN 30* 44* 39* 59*  CREATININE 4.85* 6.53* 6.10* 7.56*  CALCIUM 8.6* 9.1 8.4* 8.3*  PHOS 3.0  --   --  7.4*   Liver Function Tests: Recent Labs  Lab 05/28/23 0029 05/28/23 0030 06/03/23 1126  AST  --  15  --   ALT  --  11  --   ALKPHOS  --  72  --   BILITOT  --  0.5  --   PROT  --  7.2  --   ALBUMIN 2.1* 2.1* 1.8*   No results for input(s): "LIPASE", "AMYLASE" in the last 168 hours. CBC: Recent Labs  Lab 05/29/23 0318 05/31/23 0331 06/01/23 0047 06/03/23 1126  WBC 17.4* 18.1* 16.4* 16.7*  HGB 10.1* 10.1* 10.5* 9.7*  HCT 31.6* 30.9* 32.5* 29.9*  MCV 94.3 93.1 94.8 91.4  PLT 247 248  247 246   Blood Culture    Component Value Date/Time   SDES BLOOD BLOOD RIGHT ARM 06/01/2023 1651   SPECREQUEST  06/01/2023 1651    BOTTLES DRAWN AEROBIC ONLY Blood Culture adequate volume   CULT  06/01/2023 1651    NO GROWTH 2 DAYS Performed at [facility], [address] [place], [place] [address]    REPTSTATUS PENDING 06/01/2023 1651    Cardiac Enzymes: No results for input(s): "CKTOTAL", "CKMB", "CKMBINDEX", "TROPONINI" in the last 168 hours. CBG: Recent Labs  Lab 06/02/23 1110 06/02/23 1645 06/02/23 2113 06/03/23 0742 06/03/23 1137  GLUCAP 113* 122* 132* 125* 156*   Iron Studies: No results for input(s): "IRON", "TIBC", "TRANSFERRIN", "FERRITIN" in the last 72 hours. Lab Results  Component Value Date   INR 1.4 (H) 05/20/2023   INR 1.4 (H) 03/21/2023   INR 1.3 (H) 03/12/2022   Studies/Results: MR ANKLE LEFT WO CONTRAST  Result Date: 06/01/2023 CLINICAL DATA:  Necrotizing infection. EXAM: MRI OF THE LEFT ANKLE WITHOUT CONTRAST TECHNIQUE: Multiplanar, multisequence MR imaging of the ankle was performed. No  intravenous contrast was administered. The technologist reports the patient was premedicated prior to this test. During the MRI examination he pulled his foot out of the coil. His foot was repositioned in the MRI was restarted. Later in the MRI the patient fall this fat again. The test was ended. COMPARISON:  Left foot radiographs 06/01/2023 FINDINGS: Despite efforts by the technologist and patient, moderate to high-grade motion artifact is present on today's exam and could not be eliminated. This reduces exam sensitivity and specificity. TENDONS Peroneal: The peroneus longus and brevis tendons are grossly intact. Posteromedial: The tibialis posterior, flexor digitorum longus, and flexor hallucis longus tendons are grossly intact. Anterior: The tibialis anterior, extensor hallucis longus, and extensor digitorum longus tendons are grossly intact. Achilles: Minimal  intermediate T2 signal distal Achilles tendinosis. Plantar Fascia: Intermediate T2 signal and mild thickening suggesting mild plantar fasciitis. LIGAMENTS Lateral: Not well evaluated given high-grade motion artifact. There appears to be attenuation of the anterior talofibular ligament. Medial: The tibiotalar deep deltoid and tibial spring ligaments are grossly intact. CARTILAGE Ankle Joint: No high-grade cartilage defect is seen. Subtalar Joints/Sinus Tarsi: Mild-to-moderate middle subtalar cartilage thinning.Mild edema within the sinus tarsi. Bones: There is a soft tissue defect within the plantar heel at the level of the posteroinferior aspect of the calcaneal tuberosity. There is scattered decreased T1 and decreased T2 signal with blooming artifact indicative of the subcutaneous air seen on today's radiographs plantar to the calcaneus, tracking along the plantar fascia, and within the abductor hallucis longus muscle plantar and medial to the great toe metatarsal head. This is again concerning for a gas producing infectious organism and possible necrotizing soft tissue infection. Within the limitations of this modality and patient motion, no definitive air is seen tracking proximal to the calcaneus into the calf. There is high-grade marrow edema within the posteroinferior aspect of the calcaneus with likely mild cortical thinning (sagittal series 5, image 11) concerning for acute osteomyelitis extending throughout the posterior calcaneal tuberosity through the inferior aspect of the anterior process of the calcaneus. Other: None. IMPRESSION: 1. Unfortunately this examination is technically limited by moderate to high-grade patient motion artifact. 2. There is a soft tissue defect within the plantar heel at the level of the posteroinferior aspect of the calcaneal tuberosity. There is scattered soft tissue air as seen on today's radiographs, plantar to the calcaneus, tracking along the plantar fascia, and within the  abductor hallucis longus muscle plantar and medial to the great toe metatarsal head. This is again concerning for a gas producing infectious organism and possible necrotizing soft tissue infection. Within the limitations of this modality and patient motion, no definitive air is seen tracking proximal to the calcaneus into the calf. 3. There is high-grade marrow edema within the posteroinferior aspect of the calcaneus with likely mild cortical erosion concerning for acute osteomyelitis. Electronically Signed   By: Neita Garnet M.D.   On: 06/01/2023 18:44   DG Foot 2 Views Left  Result Date: 06/01/2023 CLINICAL DATA:  Nonhealing open wound of heel. EXAM: LEFT FOOT - 2 VIEW COMPARISON:  None Available. FINDINGS: Soft tissue defect about the plantar aspect of the calcaneus. Patchy soft tissue gas tracks into the plantar soft tissues about the hind and midfoot. There is question of diminished cortex involving the subjacent plantar calcaneal cortex. No other sites suspicious for osteomyelitis. Hallux valgus and hammertoe deformity of the digits. IMPRESSION: 1. Soft tissue defect about the plantar aspect of the calcaneus with patchy soft tissue gas tracking into the  plantar soft tissues of the mid and hindfoot. The amount of soft tissue gas raises concern for necrotizing soft tissue infection. 2. Question of diminished cortex involving the plantar calcaneal cortex subjacent to soft tissue defect, suspicious for osteomyelitis. These results will be called to the ordering clinician or representative by the Radiologist Assistant, and communication documented in the PACS or [employer]. Electronically Signed   By: Narda Rutherford M.D.   On: 06/01/2023 15:40    Medications:  sodium chloride     piperacillin-tazobactam (ZOSYN)  IV 2.25 g (06/03/23 0602)    (feeding supplement) PROSource Plus  30 mL Oral BID BM   acidophilus  2 capsule Oral Daily   amLODipine  5 mg Oral Daily   apixaban  5 mg Oral BID    atorvastatin  80 mg Oral Daily   carvedilol  25 mg Oral BID   doxercalciferol  4 mcg Intravenous Q M,W,F-HD   feeding supplement (NEPRO CARB STEADY)  237 mL Oral BID BM   finasteride  5 mg Oral Daily   furosemide  80 mg Oral Once per day on Sunday Tuesday Thursday Saturday   gabapentin  300 mg Oral BID   heparin  5,000 Units Dialysis Once in dialysis   insulin aspart  0-6 Units Subcutaneous TID WC   isosorbide mononitrate  60 mg Oral Daily   mirtazapine  15 mg Oral QHS   multivitamin  1 tablet Oral QHS   pantoprazole  40 mg Oral Daily   sodium chloride flush  3 mL Intravenous Q12H   tamsulosin  0.4 mg Oral QHS    Dialysis Orders:  AF MWF 4 hrs 180NRE 425/800 82.5 kg 2.0 K/ 2.0 Ca AVF - Heparin 5000 units IV three times per week - Hectorol 5 mcg IV three times per week  Assessment/Plan: 1. L heel wound: Plan for L BKA 06/05/2023 per Dr. Lajoyce Corners. ABX per primary.  2. ESRD -MWF Next HD 06/03/2023 3. Anemia - HGB now 9.7 .Follow HGB closely, will start ESA if needed 4. Secondary hyperparathyroidism - Correct calcium on high side. Decrease VDRA. PO4 at goal.  5. HTN/volume - BP is historically labile. High at admit. Amlodipine 5 mg PO daily recently added. Add Furosemide 80 mg PO on non HD days. Now under OP EDW by weights. Get standing wts with HD. UF as tolerated.  6. Nutrition - Low albumin. Add protein supplements 7. PAF-continue Eliquis/Carvedilol 8. DMT2- per primary 9. BPH-per primary 10. FFT-Brother died around early 31-Mar-2023. He has declined since his brother's death. Not eating. Liberalize diet.   Salome Holmes, NP [facility] 06/03/2023,1:38 PM  LOS: 13 days

## 2023-06-03 NOTE — Progress Notes (Signed)
SLP Cancellation Note  Patient Details Name: William Webb [facility] MRN: [id number] DOB: 06-05-1951   Cancelled treatment:       Reason Eval/Treat Not Completed: Patient at procedure or test/unavailable. Attempted x2 this pm; ultimately pt out of the room. Will f/u.    , [name] 06/03/2023, 2:59 PM

## 2023-06-03 NOTE — Progress Notes (Signed)
PT Cancellation Note  Patient Details Name: William Webb [facility] MRN: [id number] DOB: 03/24/1951   Cancelled Treatment:    Reason Eval/Treat Not Completed: (P) Patient at procedure or test/unavailable (PTA attempt x1 but pt having wound care by RN. Reattempt and pt at HD dept.)   [name] [facility] 06/03/2023, 6:04 PM

## 2023-06-03 NOTE — TOC Progression Note (Signed)
Transition of Care [facility]) - Initial/Assessment Note    Patient Details  Name: William Webb MRN: [id number] Date of Birth: 08/26/1951  Transition of Care [facility]) CM/SW Contact:    Ralene Bathe, LCSW Phone Number: 06/03/2023, 10:52 AM  Clinical Narrative:                 TOC following patient for  d/c planning needs once medically stable.  Cleon Gustin, MSW, LCSW    Barriers to Discharge: Continued Medical Work up   Patient Goals and CMS Choice Patient states their goals for this hospitalization and ongoing recovery are:: To return home CMS Medicare.gov Compare Post Acute Care list provided to:: Patient Choice offered to / list presented to : Patient      Expected Discharge Plan and Services   Discharge Planning Services: CM Consult   Living arrangements for the past 2 months: Apartment                                      Prior Living Arrangements/Services Living arrangements for the past 2 months: Apartment Lives with:: Self Patient language and need for interpreter reviewed:: Yes Do you feel safe going back to the place where you live?: Yes      Need for Family Participation in Patient Care: Yes (Comment) Care giver support system in place?: Yes (comment) Current home services: DME (Cane, walker, shower seat.) Criminal Activity/Legal Involvement Pertinent to Current Situation/Hospitalization: No - Comment as needed  Activities of Daily Living Home Assistive Devices/Equipment: CBG Meter, Cane (specify quad or straight), Blood pressure cuff ADL Screening (condition at time of admission) Patient's cognitive ability adequate to safely complete daily activities?: Yes Is the patient deaf or have difficulty hearing?: No Does the patient have difficulty seeing, even when wearing glasses/contacts?: No Does the patient have difficulty concentrating, remembering, or making decisions?: No Patient able to express need for assistance with ADLs?: Yes Does the  patient have difficulty dressing or bathing?: No Independently performs ADLs?: Yes (appropriate for developmental age) Does the patient have difficulty walking or climbing stairs?: Yes Weakness of Legs: Both Weakness of Arms/Hands: None  Permission Sought/Granted Permission sought to share information with : Case Manager, Family Supports Permission granted to share information with : Yes, Verbal Permission Granted              Emotional Assessment Appearance:: Appears stated age Attitude/Demeanor/Rapport: Engaged, Gracious Affect (typically observed): Accepting, Appropriate, Calm, Hopeful, Pleasant Orientation: : Oriented to Self, Oriented to Place, Oriented to  Time, Oriented to Situation Alcohol / Substance Use: Not Applicable Psych Involvement: No (comment)  Admission diagnosis:  Elevated temperature due to infection [B99.9] Fever, unspecified fever cause [R50.9] Diarrhea, unspecified type [R19.7] Patient Active Problem List   Diagnosis Date Noted   Pressure injury of skin 05/28/2023   FUO (fever of unknown origin) 05/21/2023   Leukocytosis 05/21/2023   History of CAD (coronary artery disease) 05/21/2023   Elevated temperature due to infection 05/21/2023   Pulmonary nodules 05/21/2023   Hemorrhoids 03/22/2023   Internal hemorrhoid 03/22/2023   Bright red blood per rectum 03/21/2023   Prolonged QT interval 03/21/2023   Precordial pain    Critical limb ischemia of right lower extremity (HCC)    Acute osteomyelitis of toe, right (HCC) 03/13/2022   Osteomyelitis (HCC) 03/13/2022   Elevated troponin    SIRS (systemic inflammatory response syndrome) (HCC) 03/12/2022   ESRD (end  stage renal disease) (HCC) 10/19/2021   Community acquired pneumonia 02/01/2021   PNA (pneumonia) 02/01/2021   Hypoxia    Pleural effusion on right 12/02/2020   Pericardial effusion 11/02/2020   Loss of consciousness (HCC) 09/16/2020   Syncope and collapse 09/15/2020   CKD (chronic kidney  disease), stage IV (HCC) 08/11/2020   Acute kidney injury superimposed on CKD (HCC) 07/31/2020   Hyperlipidemia associated with type 2 diabetes mellitus (HCC) 07/31/2020   Paroxysmal atrial fibrillation (HCC) 07/31/2020   Chronic disease anemia 07/31/2020   Nausea and vomiting 07/31/2020   Peripheral artery disease (HCC) 03/19/2020   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, limited to breakdown of skin (HCC) 01/08/2020   Status post vascular surgery 12/07/2019   Respiratory failure (HCC) 12/07/2019   Persistent atrial fibrillation (HCC) 07/10/2019   H/O: GI bleed 07/10/2019   CKD (chronic kidney disease) stage 5, GFR less than 15 ml/min (HCC) 06/08/2019   Erectile dysfunction 05/06/2019   Coronary artery disease involving native coronary artery of native heart without angina pectoris 04/05/2019   Acute on chronic respiratory failure with hypoxia (HCC) 12/10/2018   Healthcare-associated pneumonia 12/10/2018   Hemoptysis 12/10/2018   Sepsis (HCC) 12/10/2018   Acute respiratory failure with hypoxia (HCC) 12/10/2018   Malnutrition of moderate degree 12/02/2018   CAD (coronary artery disease) 12/01/2018   Syncope 12/01/2018   DOE (dyspnea on exertion) 11/18/2018   Hypertension associated with diabetes (HCC)    Hyperlipidemia    Diabetes mellitus with renal complications (HCC)    Noncompliance with medication regimen    Chronic pain syndrome 05/29/2018   Insulin dependent type 2 diabetes mellitus (HCC) 04/07/2018   Stage 3a chronic kidney disease (HCC) 03/11/2018   Fall 11/20/2017   AKI (acute kidney injury) (HCC) 11/20/2017   Normocytic normochromic anemia 11/20/2017   Hypoglycemia 11/19/2017   Essential hypertension 11/19/2017   PCP:  Irven Coe, MD Pharmacy:   [id number] - [place], [place] - [address] [place] [address] Phone: [phone] Fax: [phone]     Social Determinants of Health (SDOH) Social History: SDOH  Screenings   Food Insecurity: No Food Insecurity (05/21/2023)  Housing: Low Risk  (05/21/2023)  Transportation Needs: No Transportation Needs (03/21/2023)  Utilities: Not At Risk (05/21/2023)  Depression (PHQ2-9): Low Risk  (10/07/2020)  Tobacco Use: Medium Risk (05/21/2023)   SDOH Interventions: Transportation Interventions: Intervention Not Indicated, Inpatient TOC, Patient Resources (Friends/Family)   Readmission Risk Interventions    05/22/2023   11:05 AM 03/22/2023    4:41 PM 03/19/2022    3:43 PM  Readmission Risk Prevention Plan  Transportation Screening Complete Complete Complete  Medication Review (RN Care Manager) Referral to Pharmacy Complete Complete  PCP or Specialist appointment within 3-5 days of discharge Complete Complete Complete  HRI or Home Care Consult Complete Complete Complete  SW Recovery Care/Counseling Consult Complete Complete Complete  Palliative Care Screening Not Applicable Not Applicable Not Applicable  Skilled Nursing Facility Not Applicable Not Applicable Not Applicable

## 2023-06-04 ENCOUNTER — Encounter (HOSPITAL_COMMUNITY): Payer: Medicare Other

## 2023-06-04 DIAGNOSIS — M86172 Other acute osteomyelitis, left ankle and foot: Secondary | ICD-10-CM

## 2023-06-04 DIAGNOSIS — N186 End stage renal disease: Secondary | ICD-10-CM | POA: Diagnosis not present

## 2023-06-04 DIAGNOSIS — E43 Unspecified severe protein-calorie malnutrition: Secondary | ICD-10-CM | POA: Diagnosis not present

## 2023-06-04 DIAGNOSIS — B999 Unspecified infectious disease: Secondary | ICD-10-CM | POA: Diagnosis not present

## 2023-06-04 DIAGNOSIS — I739 Peripheral vascular disease, unspecified: Secondary | ICD-10-CM | POA: Diagnosis not present

## 2023-06-04 LAB — GLUCOSE, CAPILLARY
Glucose-Capillary: 90 mg/dL (ref 70–99)
Glucose-Capillary: 150 mg/dL — ABNORMAL HIGH (ref 70–99)
Glucose-Capillary: 235 mg/dL — ABNORMAL HIGH (ref 70–99)
Glucose-Capillary: 173 mg/dL — ABNORMAL HIGH (ref 70–99)

## 2023-06-04 MED ORDER — ACETAMINOPHEN 650 MG RE SUPP
650.0000 mg | Freq: Once | RECTAL | Status: AC | PRN
  Administered 2023-06-04: 650 mg via RECTAL
  Filled 2023-06-04: qty 1

## 2023-06-04 MED ORDER — CHLORHEXIDINE GLUCONATE CLOTH 2 % EX PADS: 6.0000 | MEDICATED_PAD | Freq: Every day | CUTANEOUS | Status: DC

## 2023-06-04 MED ORDER — SEVELAMER CARBONATE 800 MG PO TABS
1600.0000 mg | ORAL_TABLET | Freq: Three times a day (TID) | ORAL | Status: DC
  Administered 2023-06-04 – 2023-06-11 (×14): 1600 mg via ORAL
  Filled 2023-06-04 (×16): qty 2

## 2023-06-04 NOTE — Progress Notes (Addendum)
Physical Therapy Treatment Patient Details Name: William Webb/[facility] MRN: [id number] DOB: 05/29/1951 Today's Date: 06/04/2023   History of Present Illness 72 y.o. male presents to [facility] hospital on 05/20/2023 with chills and diarrhea, found to have fever in ED. Pt admitted for infection of unknown origin. PMH includes ESRD, PAF, HTN, HLD, CAD, PAD, DMII.    PT Comments  Pt received in supine, reporting he is agreeable to therapy session, but that he needs a bath due to bowel movement, RN notified. NT not available at that time so PTA assist RN to optimize/encourage pt independence in bed mobility. Pt slow to initiate and perform functional tasks, he is able to roll to L/R sides with modA and multimodal cues for cross-body reaching. Reinforced benefits of mobility, pressure relief schedule/technique (floating heels, rolling Q2H) and importance of doing as much on his own as he can to maintain overall strength, including self-feeding for meals and assisting staff when rolling/repositioning himself. Pt reports feelings of hopelessness due to upcoming LE surgery and states "what's the point" when PTA encouraging him to work on moving more/maintaining his strength. Pt may benefit from speaking to someone about the emotional toll of his chronic health conditions and prolonged hospitalization, especially given his upcoming planned BKA this week. Pt refusing OOB mobility due to fatigue after bed mobility/hygiene assist but agreeable to sit up in bed chair posture to eat lunch. Pt continues to benefit from PT services to progress toward functional mobility goals.    If plan is discharge home, recommend the following: A lot of help with bathing/dressing/bathroom;Assistance with cooking/housework;Direct supervision/assist for medications management;Direct supervision/assist for financial management;Assist for transportation;Help with stairs or ramp for entrance;Two people to help with walking and/or transfers   Can  travel by private vehicle     No  Equipment Recommendations  Wheelchair (measurements PT);Wheelchair cushion (measurements PT);Other (comment) (consider mechanical lift and hospital bed pending progress)    Recommendations for Other Services       Precautions / Restrictions Precautions Precautions: Fall Precaution Comments: L heel wound (prevalon boot), b/b incontinence Restrictions Weight Bearing Restrictions: No     Mobility  Bed Mobility Overal bed mobility: Needs Assistance Bed Mobility: Rolling, Supine to Sit Rolling: Mod assist   Supine to sit: Max assist     General bed mobility comments: Rolling x2 to L/R sides during bed linen change, dense cues for cross-body reaching and LE placement, pt has difficulty maintaining fully sidelying for clean-up of body/soiled linens needs consistent min/modA to maintain sidelying posture. MaxA +1-2 for pulling trunk into long sitting with bed side rails for placement/adjustment of pillow prior to pt eating lunch.    Transfers                   General transfer comment: Pt deferred this session secondary to fatigue after bed mobility/bath and linen change. Pt bed placed in full upright chair posture and he demos L lean with HOB >60*, so PTA placed pillow under his L elbow to promote more neutral posture.    Ambulation/Gait               General Gait Details: Pt not able at this time (heel wound, fatigues after bed mobility and defers OOB)   Stairs             Wheelchair Mobility     Tilt Bed    Modified Rankin (Stroke Patients Only)       Balance Overall balance assessment: Needs assistance Sitting-balance  support: Single extremity supported, Feet supported Sitting balance-Leahy Scale: Zero Sitting balance - Comments: heavy L and posterior lean with attempt at long sitting in bed       Standing balance comment: defer, pt too fatigued from bed bath to attempt                             Cognition Arousal/Alertness: Awake/alert Behavior During Therapy: Flat affect, WFL for tasks assessed/performed Overall Cognitive Status: No family/caregiver present to determine baseline cognitive functioning                                 General Comments: Pt requires increased time for processing and demonstrates ability to follow 1 step commands with increased time and occasional cues. Pt had asked for RN when pressing call bell prior to PTA arrival to room, but had not told secretary why he needed RN, therefore his hygiene needs had not been addressed. Reinforced with him importance of telling staff more specifically what he needs so the correct staff can be sent to help him and in a timely manner, and he states "they are taking my leg, I don't see why it matters". Discussed with him post-amputation likely progression of therapies and importance of pt active participation in his mobility/health care so he has better outcome and quality of life if he wants to work toward getting a prosthetic/walking again, etc. Pt with depressed affect when PTA attempted past few sessions, he may benefit from psych or chaplain consult to address the complex feelings he has regarding health challenges. Pt allowing family to feed him instead of attempting to use his UE to feed himself at end of session.        Exercises Other Exercises Other Exercises: sidelying BLE AAROM: knee flex/ext, hip abduction; supine BLE AROM: ankle pumps/circles (AA for improved ROM at times) x10 reps ea Other Exercises: pt refusing UE ROM/strengthening exercises after bed bath due to c/o fatigue, he does perform pushing/pulling to/from bed rail in sidelying x5 reps ea UE when awaiting peri-care    General Comments General comments (skin integrity, edema, etc.): VSS per chart review, no acute s/sx distress other than c/o bottom pain with bed bath      Pertinent Vitals/Pain Pain Assessment Pain Assessment: Faces Faces  Pain Scale: Hurts a little bit Pain Location: peri-anal area with hygiene assist Pain Descriptors / Indicators: Grimacing, Discomfort, Moaning Pain Intervention(s): Monitored during session, Limited activity within patient's tolerance, Repositioned (barrier ointment applied after hygiene assistance provided)     PT Goals (current goals can now be found in the care plan section) Acute Rehab PT Goals Patient Stated Goal: to improve strength and return to independence PT Goal Formulation: With patient Time For Goal Achievement: 06/07/23 Progress towards PT goals: Progressing toward goals    Frequency    Min 1X/week      PT Plan Current plan remains appropriate       AM-PAC PT "6 Clicks" Mobility   Outcome Measure  Help needed turning from your back to your side while in a flat bed without using bedrails?: A Lot Help needed moving from lying on your back to sitting on the side of a flat bed without using bedrails?: A Lot Help needed moving to and from a bed to a chair (including a wheelchair)?: Total Help needed standing up from a chair using your arms (  e.g., wheelchair or bedside chair)?: Total Help needed to walk in hospital room?: Total Help needed climbing 3-5 steps with a railing? : Total 6 Click Score: 8    End of Session   Activity Tolerance: Patient limited by fatigue Patient left: in bed;with call bell/phone within reach;with bed alarm set;with family/visitor present;Other (comment) (pt heels floated and bed in chair posture, pillow under L elbow to promote neutral posture) Nurse Communication: Mobility status;Need for lift equipment PT Visit Diagnosis: Other abnormalities of gait and mobility (R26.89);Muscle weakness (generalized) (M62.81)     Time: 1610-9604 PT Time Calculation (min) (ACUTE ONLY): 30 min  Charges:    $Therapeutic Exercise: 8-22 mins $Therapeutic Activity: 8-22 mins PT General Charges $$ ACUTE PT VISIT: 1 Visit                       P., PTA Acute Rehabilitation Services Secure Chat Preferred 9a-5:30pm Office: [phone]    Nichols Demarest [facility] 06/04/2023, 2:12 PM
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
## 2023-06-04 NOTE — Progress Notes (Signed)
Pharmacy Antibiotic Note  William Webb is a 72 y.o. male admitted on 05/20/2023 with  sepsis .  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 Planning L BKA this admit. Pharmacy has been consulted for Zosyn dosing. Of note, patient is on HD MWF.  Plan: Continue Zosyn 2.25g IV q8h  Continue to monitor clinical improvement, OR plans, abx LOT  Height: 6\' 3"  (190.5 cm) Weight: 79 kg (174 lb 2.6 oz) IBW/kg (Calculated) : 84.5  Temp (24hrs), Avg:99.9 F (37.7 C), Min:99.2 F (37.3 C), Max:101.8 F (38.8 C)  Recent Labs  Lab 05/29/23 0318 05/31/23 0331 06/01/23 0047 06/03/23 1126  WBC 17.4* 18.1* 16.4* 16.7*  CREATININE 6.53* 6.10*  --  7.56*    Estimated Creatinine Clearance: 9.7 mL/min (A) (by C-G formula based on SCr of 7.56 mg/dL (H)).    No Known Allergies  Antimicrobials this admission: CTX 7/22 >> 7/28 Azithro 7/27 >> 7/28 Clindamycin 8/3 >> 8/4 Vancomycin 8/3 >> 8/5 Zosyn 8/3 >>  Dose adjustments this admission: None  Microbiology results: 7/22 BCx: neg 7/23 C.Diff: neg 7/23 GI Panel: neg 7/23 MRSA PCR: neg 8/3 BCx: ngtd  Thank you for allowing pharmacy to be a part of this patient's care.  Rexford Maus, PharmD, BCPS 06/04/2023 7:38 AM
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
## 2023-06-04 NOTE — Plan of Care (Signed)
  Problem: Health Behavior/Discharge Planning: Goal: Ability to manage health-related needs will improve Outcome: Progressing   Problem: Clinical Measurements: Goal: Respiratory complications will improve Outcome: Progressing   Problem: Elimination: Goal: Will not experience complications related to bowel motility Outcome: Progressing   Problem: Pain Managment: Goal: General experience of comfort will improve Outcome: Progressing

## 2023-06-04 NOTE — Progress Notes (Signed)
[facility] Progress Note   Subjective:    Seen and examined patient at bedside. Lying in bed but more awake today. Patient's family at family. Appears he signed off early from HD yesterday. He denies SOB, CP, and N/V. Seen by Dr. Lajoyce Corners today. Plan to proceed with a transtibial amputation on 06/07/23.   Objective Vitals:   06/04/23 0500 06/04/23 0506 06/04/23 0646 06/04/23 0938  BP:  133/61  (!) 132/52  Pulse:  79  74  Resp:  20  18  Temp:  (!) 101.8 F (38.8 C) 100 F (37.8 C) 98 F (36.7 C)  TempSrc:  Oral    SpO2:  98%  98%  Weight: 79 kg     Height:       Physical Exam General: Chronically ill appearing older male in NAD Neuro: Oriented X 3 Heart: S1,S2, RRR SR on monitor.  Lungs: CTAB Abdomen: NABS, flat Extremities: Foul smelling Wound L heel. Trace pedal edema Dialysis Access: L AVF + TB  Filed Weights   06/03/23 0521 06/03/23 1445 06/04/23 0500  Weight: 77.1 kg 77.1 kg 79 kg    Intake/Output Summary (Last 24 hours) at 06/04/2023 1416 Last data filed at 06/04/2023 1300 Gross per 24 hour  Intake 410 ml  Output --  Net 410 ml    Additional Objective Labs: Basic Metabolic Panel: Recent Labs  Lab 05/29/23 0318 05/31/23 0331 06/03/23 1126  NA 134* 131* 130*  K 3.9 3.9 4.4  CL 91* 91* 92*  CO2 26 25 20*  GLUCOSE 178* 98 177*  BUN 44* 39* 59*  CREATININE 6.53* 6.10* 7.56*  CALCIUM 9.1 8.4* 8.3*  PHOS  --   --  7.4*   Liver Function Tests: Recent Labs  Lab 06/03/23 1126  ALBUMIN 1.8*   No results for input(s): "LIPASE", "AMYLASE" in the last 168 hours. CBC: Recent Labs  Lab 05/29/23 0318 05/31/23 0331 06/01/23 0047 06/03/23 1126  WBC 17.4* 18.1* 16.4* 16.7*  HGB 10.1* 10.1* 10.5* 9.7*  HCT 31.6* 30.9* 32.5* 29.9*  MCV 94.3 93.1 94.8 91.4  PLT 247 248 247 246   Blood Culture    Component Value Date/Time   SDES BLOOD BLOOD RIGHT ARM 06/01/2023 1651   SPECREQUEST  06/01/2023 1651    BOTTLES DRAWN AEROBIC ONLY Blood Culture  adequate volume   CULT  06/01/2023 1651    NO GROWTH 3 DAYS Performed at [facility], [address], [place], [place] [address]    REPTSTATUS PENDING 06/01/2023 1651    Cardiac Enzymes: No results for input(s): "CKTOTAL", "CKMB", "CKMBINDEX", "TROPONINI" in the last 168 hours. CBG: Recent Labs  Lab 06/03/23 0742 06/03/23 1137 06/03/23 2147 06/04/23 0730 06/04/23 1142  GLUCAP 125* 156* 99 90 150*   Iron Studies: No results for input(s): "IRON", "TIBC", "TRANSFERRIN", "FERRITIN" in the last 72 hours. Lab Results  Component Value Date   INR 1.4 (H) 05/20/2023   INR 1.4 (H) 03/21/2023   INR 1.3 (H) 03/12/2022   Studies/Results: No results found.  Medications:  sodium chloride     piperacillin-tazobactam (ZOSYN)  IV 2.25 g (06/04/23 1358)    (feeding supplement) PROSource Plus  30 mL Oral BID BM   acidophilus  2 capsule Oral Daily   amLODipine  5 mg Oral Daily   apixaban  5 mg Oral BID   atorvastatin  80 mg Oral Daily   carvedilol  25 mg Oral BID   doxercalciferol  4 mcg Intravenous Q M,W,F-HD  feeding supplement (NEPRO CARB STEADY)  237 mL Oral BID BM   finasteride  5 mg Oral Daily   furosemide  80 mg Oral Once per day on Sunday Tuesday Thursday Saturday   gabapentin  300 mg Oral BID   insulin aspart  0-6 Units Subcutaneous TID WC   isosorbide mononitrate  60 mg Oral Daily   mirtazapine  15 mg Oral QHS   multivitamin  1 tablet Oral QHS   pantoprazole  40 mg Oral Daily   sodium chloride flush  3 mL Intravenous Q12H   tamsulosin  0.4 mg Oral QHS    Dialysis Orders: AF MWF 4 hrs 180NRE 425/800 82.5 kg 2.0 K/ 2.0 Ca AVF - Heparin 5000 units IV three times per week - Hectorol 5 mcg IV three times per week  Assessment/Plan: 1. L heel wound: Plan for L BKA 06/07/2023 per Dr. Lajoyce Corners. ABX per primary. Will schedule patient's HD around scheduled surgery. I think it should be ok to re-schedule his Friday's HD to Sat! 2. ESRD -MWF Next HD 06/05/2023.  3. Anemia -  HGB now 9.7 .Follow HGB closely, will start ESA if needed 4. Secondary hyperparathyroidism - Correct calcium on high side. Decrease VDRA. PO4 high, will resume Renvela 2 tabs with meals.  5. HTN/volume - BP is historically labile. High at admit. Amlodipine 5 mg PO daily recently added. Add Furosemide 80 mg PO on non HD days. Now under OP EDW by weights. Get standing wts with HD. UF as tolerated.  6. Nutrition - Low albumin. On protein supplements 7. PAF-continue Eliquis/Carvedilol 8. DMT2- per primary 9. BPH-per primary 10. FFT-Brother died around early 03/29/2023. He has declined since his brother's death. Not eating. Liberalize diet.   Salome Holmes, NP [facility] 06/04/2023,2:16 PM  LOS: 14 days
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
## 2023-06-04 NOTE — Progress Notes (Addendum)
William Webb [facility]  MVH:[id number] DOB: 07/24/1951 DOA: 05/20/2023 PCP: Irven Coe, MD    Brief Narrative:  72 year old with a history of ESRD on HD MWF, PAF on Eliquis, HTN, HLD, CAD, PAD, and DM2 who presented to the ER 7/22 with chills and an episode of loose stool at home.  Initial workup to include TTE and CT scans did not reveal a clear source for his fever.  Goals of Care:   Code Status: Full Code   DVT prophylaxis: SCDs Start: 05/21/23 0155 apixaban (ELIQUIS) tablet 5 mg   Interim Hx: The patient has been evaluated by Dr. Lajoyce Corners, with plans to go to the operating room for transtibial amputation 06/07/2023.  The patient refused to have blood drawn this morning.  Vital signs are stable though the patient did have a Tmax of 101.8 at 5 AM today.  He is resting comfortably at the time of my visit.  I spoke with his family at bedside.  Family reports understanding of the current treatment plan with no questions today.  Assessment & Plan:  Gangrene of the left heel  First appreciated 8/3 AM -plain film x-ray suggested possibility of soft tissue necrotizing infection - Podiatry was contacted and deferred to Orthopedics - antibiotic coverage broadened - fortunately MRI did not suggest necrotizing fasciitis - Ortho planning on left transtibial amputation 06/07/2023  Fever of unknown origin - now explained by above Has undergone an extensive inpatient evaluation -left AV fistula healthy without any signs of infection -negative COVID x 2 -CXR unrevealing -CT abdomen without evidence of acute intra-abdominal process -CT chest revealed some scattered right lung nodules possibly representing infectious or inflammatory etiology with fungal workup underway -blood cultures negative x 2 -was dosed with empiric Rocephin for 5 days without significant change to persistent fever -ID has been consulted -TTE without evidence of gross endocarditis -no significant lymphadenopathy noted on CT scanning -plain  x-rays of the right hip without acute findings -ultimately the patient was discovered to have a left heel wound which likely represents the source of his fever  Loose stool C. difficile screen with PCR and GI panel both negative -possibly simply an inflammatory reaction to his illness and fever -Imodium as needed  Asymptomatic cholelithiasis Noted incidentally on CT abdomen and pelvis -LFTs normal as of 7/29 - recheck in AM   Incidental finding of lung nodules Noted during CT abdomen and pelvis to include solid nodules of the right lower and right middle lobe - CT chest/abdom/pelvix 7/30 confirmed part solid 16mm RML and 11mm RLL nodules favored to represent infectious/inflammatory etiology - follow-up noncontrast CT chest is recommended in 3-6 months for  follow-up - Dr. Rito Ehrlich has already made an ambulatory referral to Pulmonary  ESRD on HD MWF Nephrology following - HD per Nephrology   HTN Controlled at this time   PAF Continue chronic apixaban and Coreg - rate controlled   History of CAD and history of PAD Asymptomatic  DM2 A1c 6.0 May 2024 -CBG currently well-controlled as inpatient  Peripheral neuropathy Continue usual Neurontin dose  BPH Continue usual Flomax and Proscar  Mild hyponatremia Likely due to limited/poor intake - should correct w/ HD  Situational depression The patient suffered the death of his brother approximately 2 months ago and family reports that he has been in a state of decline since -a trial of Remeron has been initiated during this hospital stay - Chaplain consulted   Family Communication: Spoke with multiple family members at bedside Disposition: Will depend upon  postoperative course -suspect SNF rehab stay will be indicated   Objective: Blood pressure 133/61, pulse 79, temperature 100 F (37.8 C), resp. rate 20, height 6\' 3"  (1.905 m), weight 79 kg, SpO2 98%.  Intake/Output Summary (Last 24 hours) at 06/04/2023 0934 Last data filed at  06/03/2023 1532 Gross per 24 hour  Intake 50 ml  Output --  Net 50 ml   Filed Weights   06/03/23 0521 06/03/23 1445 06/04/23 0500  Weight: 77.1 kg 77.1 kg 79 kg    Examination: General: No acute respiratory distress Lungs: Clear to auscultation bilaterally with no wheezing  Cardiovascular: Regular rate and rhythm without murmur Abdomen: Nontender, nondistended, soft, bowel sounds positive, no rebound Extremities: No significant edema bilateral lower extremities - L LE in pressure relieving boot   CBC: Recent Labs  Lab 05/31/23 0331 06/01/23 0047 06/03/23 1126  WBC 18.1* 16.4* 16.7*  HGB 10.1* 10.5* 9.7*  HCT 30.9* 32.5* 29.9*  MCV 93.1 94.8 91.4  PLT 248 247 246   Basic Metabolic Panel: Recent Labs  Lab 05/29/23 0318 05/31/23 0331 06/03/23 1126  NA 134* 131* 130*  K 3.9 3.9 4.4  CL 91* 91* 92*  CO2 26 25 20*  GLUCOSE 178* 98 177*  BUN 44* 39* 59*  CREATININE 6.53* 6.10* 7.56*  CALCIUM 9.1 8.4* 8.3*  PHOS  --   --  7.4*   GFR: Estimated Creatinine Clearance: 9.7 mL/min (A) (by C-G formula based on SCr of 7.56 mg/dL (H)).   Scheduled Meds:  (feeding supplement) PROSource Plus  30 mL Oral BID BM   acidophilus  2 capsule Oral Daily   amLODipine  5 mg Oral Daily   apixaban  5 mg Oral BID   atorvastatin  80 mg Oral Daily   carvedilol  25 mg Oral BID   doxercalciferol  4 mcg Intravenous Q M,W,F-HD   feeding supplement (NEPRO CARB STEADY)  237 mL Oral BID BM   finasteride  5 mg Oral Daily   furosemide  80 mg Oral Once per day on Sunday Tuesday Thursday Saturday   gabapentin  300 mg Oral BID   insulin aspart  0-6 Units Subcutaneous TID WC   isosorbide mononitrate  60 mg Oral Daily   mirtazapine  15 mg Oral QHS   multivitamin  1 tablet Oral QHS   pantoprazole  40 mg Oral Daily   sodium chloride flush  3 mL Intravenous Q12H   tamsulosin  0.4 mg Oral QHS   Continuous Infusions:  sodium chloride     piperacillin-tazobactam (ZOSYN)  IV Stopped (06/04/23 0540)      LOS: 14 days   Lonia Blood, MD [facility] Office  [phone] Pager - Text Page per Loretha Stapler  If 7PM-7AM, please contact night-coverage per Amion 06/04/2023, 9:34 AM

## 2023-06-05 DIAGNOSIS — B999 Unspecified infectious disease: Secondary | ICD-10-CM | POA: Diagnosis not present

## 2023-06-05 LAB — RENAL FUNCTION PANEL
Albumin: 1.6 g/dL — ABNORMAL LOW (ref 3.5–5.0)
Anion gap: 16 — ABNORMAL HIGH (ref 5–15)
BUN: 44 mg/dL — ABNORMAL HIGH (ref 8–23)
CO2: 22 mmol/L (ref 22–32)
Calcium: 8.2 mg/dL — ABNORMAL LOW (ref 8.9–10.3)
Chloride: 93 mmol/L — ABNORMAL LOW (ref 98–111)
Creatinine, Ser: 6.14 mg/dL — ABNORMAL HIGH (ref 0.61–1.24)
GFR, Estimated: 9 mL/min — ABNORMAL LOW (ref >60)
Glucose, Bld: 199 mg/dL — ABNORMAL HIGH (ref 70–99)
Phosphorus: 5.7 mg/dL — ABNORMAL HIGH (ref 2.5–4.6)
Potassium: 3.9 mmol/L (ref 3.5–5.1)
Sodium: 131 mmol/L — ABNORMAL LOW (ref 135–145)

## 2023-06-05 LAB — CBC
HCT: 28.8 % — ABNORMAL LOW (ref 39.0–52.0)
Hemoglobin: 9.2 g/dL — ABNORMAL LOW (ref 13.0–17.0)
MCH: 30.7 pg (ref 26.0–34.0)
MCHC: 31.9 g/dL (ref 30.0–36.0)
MCV: 96 fL (ref 80.0–100.0)
Platelets: 254 10*3/uL (ref 150–400)
RBC: 3 MIL/uL — ABNORMAL LOW (ref 4.22–5.81)
RDW: 14.7 % (ref 11.5–15.5)
WBC: 15.4 10*3/uL — ABNORMAL HIGH (ref 4.0–10.5)
nRBC: 0 % (ref 0.0–0.2)

## 2023-06-05 LAB — GLUCOSE, CAPILLARY
Glucose-Capillary: 197 mg/dL — ABNORMAL HIGH (ref 70–99)
Glucose-Capillary: 214 mg/dL — ABNORMAL HIGH (ref 70–99)
Glucose-Capillary: 134 mg/dL — ABNORMAL HIGH (ref 70–99)
Glucose-Capillary: 180 mg/dL — ABNORMAL HIGH (ref 70–99)

## 2023-06-05 LAB — HEPATITIS B SURFACE ANTIGEN: Hepatitis B Surface Ag: NONREACTIVE

## 2023-06-05 MED ORDER — HEPARIN SODIUM (PORCINE) 1000 UNIT/ML IJ SOLN
2500.0000 [IU] | Freq: Once | INTRAMUSCULAR | Status: AC
  Administered 2023-06-05: 2500 [IU] via INTRAVENOUS
  Filled 2023-06-05: qty 3

## 2023-06-05 MED ORDER — PENTAFLUOROPROP-TETRAFLUOROETH EX AERO: 1.0000 | INHALATION_SPRAY | CUTANEOUS | Status: DC | PRN

## 2023-06-05 MED ORDER — DARBEPOETIN ALFA 60 MCG/0.3ML IJ SOSY
60.0000 ug | PREFILLED_SYRINGE | INTRAMUSCULAR | Status: DC
  Administered 2023-06-05: 60 ug via SUBCUTANEOUS
  Filled 2023-06-05: qty 0.3

## 2023-06-05 NOTE — Plan of Care (Signed)
  Problem: Clinical Measurements: Goal: Will remain free from infection Outcome: Not Progressing   Problem: Nutrition: Goal: Adequate nutrition will be maintained Outcome: Not Progressing   Problem: Elimination: Goal: Will not experience complications related to bowel motility Outcome: Not Progressing   Problem: Safety: Goal: Ability to remain free from injury will improve Outcome: Not Progressing

## 2023-06-05 NOTE — Plan of Care (Signed)
  Problem: Clinical Measurements: Goal: Ability to maintain clinical measurements within normal limits will improve Outcome: Progressing   Problem: Safety: Goal: Ability to remain free from injury will improve Outcome: Progressing   

## 2023-06-05 NOTE — Procedures (Signed)
HD Note:  Some information was entered later than the data was gathered due to patient care needs. The stated time with the data is accurate.  Received patient in bed to unit.   Alert and oriented. Patient expressing sadness about upcoming surgery. Patient has flat affect.  Patient resting with eyes closed during treatment.  Informed consent signed and in chart.   TX duration:3.5 hours  Patient tolerated treatment well.   Transported back to the room   Alert, without acute distress.   Access used: Upper left arm fistula Access issues: None  Total UF removed: 1000 ml  Hand-off given to patient's nurse.    L. Dareen Piano, RN Kidney Dialysis Unit.

## 2023-06-05 NOTE — Progress Notes (Signed)
SLP Cancellation Note  Patient Details Name: William Webb [facility] MRN: [id number] DOB: May 13, 1951   Cancelled treatment:       Reason Eval/Treat Not Completed: Patient at procedure or test/unavailable   , [name] 06/05/2023, 8:14 AM

## 2023-06-05 NOTE — Progress Notes (Signed)
Physical Therapy Treatment Patient Details Name: [facility] MRN: [id number] DOB: 1951/08/28 Today's Date: 06/05/2023   History of Present Illness 72 y.o. male presents to [facility] hospital on 05/20/2023 with chills and diarrhea, found to have fever in ED. Pt admitted for infection of unknown origin. PMH includes ESRD, PAF, HTN, HLD, CAD, PAD, DMII.    PT Comments  Pt received in supine, agreeable to repositioning into more upright chair posture and instruction on LE exercises for HEP (handout provided). Defer EOB/OOB due to pt notable fatigue/increased lethargy after repositioning and family member attempting to feed him dinner. Discussed pressure relief schedule/frequency and aspiration prevention precautions (HOB fully elevated ~30 mins after finishing meals to reduce risk of aspiration, etc). Pt continues to benefit from PT services to progress toward functional mobility goals.     If plan is discharge home, recommend the following: A lot of help with bathing/dressing/bathroom;Assistance with cooking/housework;Direct supervision/assist for medications management;Direct supervision/assist for financial management;Assist for transportation;Help with stairs or ramp for entrance;Two people to help with walking and/or transfers   Can travel by private vehicle     No  Equipment Recommendations  Wheelchair (measurements PT);Wheelchair cushion (measurements PT);Other (comment) (consider mechanical lift and hospital bed pending progress)    Recommendations for Other Services       Precautions / Restrictions Precautions Precautions: Fall Precaution Comments: L heel wound (prevalon boot), b/b incontinence Restrictions Weight Bearing Restrictions: No Other Position/Activity Restrictions: defer WB on LLE given heel wound, also MD plan for LLE amputation on 8/9     Mobility  Bed Mobility Overal bed mobility: Needs Assistance Bed Mobility: Supine to Sit     Supine to sit: Max assist, +2  for safety/equipment, HOB elevated, Used rails     General bed mobility comments: MaxA +1-2 for pulling trunk into long sitting with bed side rails for placement/adjustment of pillow and for posterior supine scooting toward HOB with bed in trendelenburg posture, bed pad assist.    Transfers                   General transfer comment: Defer as pt family member attempting to feed him dinner, pt agreeable to bed in chair posture for better digestion/to reduce aspiration risk    Ambulation/Gait               General Gait Details: Pt not able at this time (heel wound, fatigued)   Stairs             Wheelchair Mobility     Tilt Bed    Modified Rankin (Stroke Patients Only)       Balance Overall balance assessment: Needs assistance Sitting-balance support: Single extremity supported, Feet supported Sitting balance-Leahy Scale: Zero Sitting balance - Comments: heavy L and posterior lean with attempt at long sitting in bed                                    Cognition Arousal: Lethargic Behavior During Therapy: Flat affect, WFL for tasks assessed/performed Overall Cognitive Status: Difficult to assess                                 General Comments: Pt requires increased time for processing and difficult to understand his speech at times as pt with increasing lethargy. Pt family member present and attempting to assist him to  take bites of his dinner before/after repositioning. He reports fatigue after HD session in AM.        Exercises Other Exercises Other Exercises: supine BLE AAROM: SAQ, ankle pumps x10 reps ea, General Strengthening: Supine HEP printed and brought to room, reviewed with his family member who was present    General Comments        Pertinent Vitals/Pain Pain Assessment Pain Assessment: No/denies pain Pain Intervention(s): Monitored during session, Repositioned     PT Goals (current goals can now be  found in the care plan section) Acute Rehab PT Goals Patient Stated Goal: to improve strength and return to independence PT Goal Formulation: With patient Time For Goal Achievement: 06/07/23 Progress towards PT goals: Progressing toward goals    Frequency    Min 1X/week      PT Plan Current plan remains appropriate       AM-PAC PT "6 Clicks" Mobility   Outcome Measure  Help needed turning from your back to your side while in a flat bed without using bedrails?: A Lot Help needed moving from lying on your back to sitting on the side of a flat bed without using bedrails?: Total Help needed moving to and from a bed to a chair (including a wheelchair)?: Total Help needed standing up from a chair using your arms (e.g., wheelchair or bedside chair)?: Total Help needed to walk in hospital room?: Total Help needed climbing 3-5 steps with a railing? : Total 6 Click Score: 7    End of Session   Activity Tolerance: Patient limited by lethargy;Other (comment) (family member attempting to assist him to eat, pt agreeable only to bed chair posture repositioning due to fatigue after HD) Patient left: in bed;with call bell/phone within reach;with bed alarm set;with family/visitor present;Other (comment) (pt heels floated and bed in chair posture, pillow under each elbow to promote neutral posture. family member attempting to feed his dinner) Nurse Communication: Mobility status;Need for lift equipment;Other (comment) (need hoyer for OOB) PT Visit Diagnosis: Other abnormalities of gait and mobility (R26.89);Muscle weakness (generalized) (M62.81)     Time: 8413-2440 PT Time Calculation (min) (ACUTE ONLY): 16 min  Charges:    $Therapeutic Activity: 8-22 mins PT General Charges $$ ACUTE PT VISIT: 1 Visit                      P., PTA Acute Rehabilitation Services Secure Chat Preferred 9a-5:30pm Office: [phone]    [facility] 06/05/2023, 7:27 PM

## 2023-06-05 NOTE — Progress Notes (Signed)
At [phone] patient verbalized that he wants to drink tea, asked if he also wants to eat anything and he agreed with [place] sandwich.  At [phone]- patient able to eat 3/4 of [place] sandwich and drink 1 cup of tea and 150 ml of lemon lime.

## 2023-06-05 NOTE — Progress Notes (Signed)
**Note De-Identified vi Obfusction** PROGRESS NOTE    [facility]  MVH:[id number] DOB: [birth year] DOA: 05/20/2023 PCP: Irven Coe, MD  Chief Complint  Ptient presents with   Ftigue    Brief Nrrtive:   67 yer old with  history of ESRD on HD MWF, PAF on Eliquis, HTN, HLD, CAD, PAD, nd DM2 who presented to the ER 7/22 with chills nd n episode of loose stool t home. Initil workup to include TTE nd CT scns did not revel  cler source for his fever.   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)   Severe protein-clorie mlnutrition (HCC)   Peripherl rtery disese (HCC)   Proxysml tril fibrilltion (HCC)   Chronic disese nemi   ESRD (end stge renl disese) (HCC)   Internl hemorrhoid   History of CAD (coronry rtery disese)   Pulmonry nodules   Pressure injury of skin   Acute osteomyelitis of left clcneus (HCC)  Gngrene of the left heel  Fever First pprecited 8/3 AM -plin film x-ry suggested possibility of soft tissue necrotizing infection - Poditry ws contcted nd deferred to Orthopedics - ntibiotic coverge brodened - fortuntely MRI did not suggest necrotizing fsciitis - Ortho plnning on left trnstibil mputtion 06/07/2023 Hd undergone n extensive inptient evlution -left AV fistul helthy without ny signs of infection -negtive COVID x 2 -CXR unreveling -CT bdomen without evidence of cute intr-bdominl process -CT chest reveled some scttered right lung nodules possibly representing infectious or inflmmtory etiology with fungl workup underwy -blood cultures negtive x 2 -ws dosed with empiric Rocephin for 5 dys without significnt chnge to persistent fever -ID hs been consulted -TTE without evidence of gross endocrditis -no significnt lymphdenopthy noted on CT  scnning -plin x-rys of the right hip without cute findings -ultimtely the ptient ws discovered to hve  left heel wound which likely represents the source of his fever   Loose stool C. difficile screen with PCR nd GI pnel both negtive -possibly simply n inflmmtory rection to his illness nd fever -Imodium s needed   Asymptomtic cholelithisis Noted incidentlly on CT bdomen nd pelvis    Incidentl finding of lung nodules Noted during CT bdomen nd pelvis to include solid nodules of the right lower nd right middle lobe - CT chest/bdom/pelvix 7/30 confirmed prt solid 16mm RML nd 11mm RLL nodules fvored to represent infectious/inflmmtory etiology - follow-up noncontrst CT chest is recommended in 3-6 months for  follow-up - Dr. Rito Ehrlich hs lredy mde n mbultory referrl to Pulmonry   ESRD on HD MWF Nephrology following - HD per Nephrology    HTN Controlled t this time    PAF Continue chronic pixbn nd Coreg - rte controlled    History of CAD nd history of PAD Asymptomtic   DM2 Currently on SSI 6 on 02/2023   Peripherl neuropthy Continue usul Neurontin dose   BPH Continue usul Flomx nd Proscr   Mild hypontremi Follow, volume per renl with HD   Situtionl depression The ptient suffered the deth of his brother pproximtely 2 months go nd fmily reports tht he hs been in  stte of decline since - tril of Remeron hs been initited during this hospitl sty - Chplin consulted     DVT prophylxis: eliquis Code Sttus: full Fmily Communiction: none Disposition:   Sttus is: Inptient Remins inptient pproprite becuse:  continued need for inpatient care   Consultants:  Orthopedics renal  Procedures:  none  Antimicrobials:  Anti-infectives (From admission, onward)    Start     Dose/Rate Route Frequency Ordered Stop   06/04/23 0600  piperacillin-tazobactam (ZOSYN) IVPB 2.25 g        2.25 g 100 mL/hr  over 30 Minutes Intravenous Every 8 hours 06/03/23 2156     06/03/23 1200  vancomycin (VANCOREADY) IVPB 750 mg/150 mL  Status:  Discontinued        750 mg 150 mL/hr over 60 Minutes Intravenous Every M-W-F (Hemodialysis) 06/01/23 1644 06/03/23 0901   06/01/23 1730  piperacillin-tazobactam (ZOSYN) IVPB 2.25 g  Status:  Discontinued        2.25 g 100 mL/hr over 30 Minutes Intravenous Every 8 hours 06/01/23 1633 06/03/23 2156   06/01/23 1730  vancomycin (VANCOREADY) IVPB 1750 mg/350 mL        1,750 mg 175 mL/hr over 120 Minutes Intravenous  Once 06/01/23 1633 06/01/23 2137   06/01/23 1700  clindamycin (CLEOCIN) IVPB 600 mg  Status:  Discontinued        600 mg 100 mL/hr over 30 Minutes Intravenous Every 8 hours 06/01/23 1602 06/02/23 1306   05/25/23 1500  azithromycin (ZITHROMAX) 500 mg in sodium chloride 0.9 % 250 mL IVPB  Status:  Discontinued        500 mg 250 mL/hr over 60 Minutes Intravenous Every 24 hours 05/25/23 1320 05/26/23 1802   05/21/23 1000  cefTRIAXone (ROCEPHIN) 2 g in sodium chloride 0.9 % 100 mL IVPB  Status:  Discontinued        2 g 200 mL/hr over 30 Minutes Intravenous Every 24 hours 05/21/23 0154 05/26/23 1802   05/21/23 0145  vancomycin (VANCOREADY) IVPB 1750 mg/350 mL        1,750 mg 175 mL/hr over 120 Minutes Intravenous  Once 05/21/23 0139 05/21/23 0640   05/20/23 2345  cefTRIAXone (ROCEPHIN) 1 g in sodium chloride 0.9 % 100 mL IVPB        1 g 200 mL/hr over 30 Minutes Intravenous  Once 05/20/23 2334 05/21/23 0030       Subjective: No complaints - seen on dialysis today  Objective: Vitals:   06/05/23 1100 06/05/23 1130 06/05/23 1200 06/05/23 1235  BP: (!) 160/69 138/74 (!) 147/59 (!) 145/69  Pulse: 83 83 81 85  Resp: 13 17 17 15   Temp:    99.4 F (37.4 C)  TempSrc:    Oral  SpO2: 98% 98% 97% 97%  Weight:      Height:        Intake/Output Summary (Last 24 hours) at 06/05/2023 1543 Last data filed at 06/05/2023 1235 Gross per 24 hour  Intake 387 ml   Output 1000 ml  Net -613 ml   Filed Weights   06/04/23 0500 06/05/23 0500 06/05/23 0807  Weight: 79 kg 75.4 kg 74.9 kg    Examination:  General exam: Appears calm and comfortable  Respiratory system: unlabored Cardiovascular system:RRR Gastrointestinal system: Abdomen is nondistended, soft and nontender.  Central nervous system: Alert and oriented. No focal neurological deficits. Extremities: dressing to LLE, in prafo boot    Data Reviewed: I have personally reviewed following labs and imaging studies  CBC: Recent Labs  Lab 05/31/23 0331 06/01/23 0047 06/03/23 1126 06/05/23 0820  WBC 18.1* 16.4* 16.7* 15.4*  HGB 10.1* 10.5* 9.7* 9.2*  HCT 30.9* 32.5* 29.9* 28.8*  MCV 93.1 94.8 91.4 96.0  PLT 248 247 246 **Note De-Identified vi Obfusction** 254    Bsic Metbolic Pnel: Recent Lbs  Lb 05/31/23 0331 06/03/23 1126 06/05/23 0826  N 131* 130* 131*  K 3.9 4.4 3.9  CL 91* 92* 93*  CO2 25 20* 22  GLUCOSE 98 177* 199*  BUN 39* 59* 44*  CRETININE 6.10* 7.56* 6.14*  CLCIUM 8.4* 8.3* 8.2*  PHOS  --  7.4* 5.7*    GFR: Estimted Cretinine Clernce: 11.4 mL/min () (by [place] formul bsed on SCr of 6.14 mg/dL (H)).  Liver Function Tests: Recent Lbs  Lb 06/03/23 1126 06/05/23 0826  LBUMIN 1.8* 1.6*    CBG: Recent Lbs  Lb 06/04/23 1142 06/04/23 1636 06/04/23 2145 06/05/23 0658 06/05/23 0732  GLUCP 150* 235* 173* 197* 214*     Recent Results (from the pst 240 hour(s))  Blstomyces ntigen     Sttus: None   Collection Time: 05/30/23 12:55 M   Specimen: Blood  Result Vlue Ref Rnge Sttus   Blstomyces ntigen None Detected None Detected ng/mL Finl    Comment: (NOTE) Reference Intervl: None Detected Reportble Rnge: 0.31 ng/mL - 20.00 ng/mL Results bove 20.00 ng/mL re reported s 'Positive, bove the Limit of Quntifiction' This test ws developed nd its performnce chrcteristics determined by [facility]. It hs not been clered or pproved by the  FD; however, FD clernce or pprovl is not currently required for clinicl use. The results re not intended to be used s the sole mens for clinicl dignosis or ptient decisions.    Specimen Type SERUM  Finl    Comment: (NOTE) Performed t: [facility] [address] [place], [place] [id number] Roxnne Gtes MD WU:[id number]   Culture, blood (Routine X 2) w Reflex to ID Pnel     Sttus: None (Preliminry result)   Collection Time: 06/01/23  4:49 PM   Specimen: BLOOD  Result Vlue Ref Rnge Sttus   Specimen Description BLOOD BLOOD RIGHT HND  Finl   Specil Requests   Finl    BOTTLES DRWN EROBIC ND NEROBIC Blood Culture dequte volume   Culture   Finl    NO GROWTH 4 DYS Performed t [facility], [address] [place], [place] [address]    Report Sttus PENDING  Incomplete  Culture, blood (Routine X 2) w Reflex to ID Pnel     Sttus: None (Preliminry result)   Collection Time: 06/01/23  4:51 PM   Specimen: BLOOD  Result Vlue Ref Rnge Sttus   Specimen Description BLOOD BLOOD RIGHT RM  Finl   Specil Requests   Finl    BOTTLES DRWN EROBIC ONLY Blood Culture dequte volume   Culture   Finl    NO GROWTH 4 DYS Performed t [facility], [address], [place], [place] [address]    Report Sttus PENDING  Incomplete  Surgicl pcr screen     Sttus: None   Collection Time: 06/01/23  9:56 PM   Specimen: Nsl Mucos; Nsl Swb  Result Vlue Ref Rnge Sttus   MRS, PCR NEGTIVE NEGTIVE Finl   Stphylococcus ureus NEGTIVE NEGTIVE Finl    Comment: (NOTE) The Xpert S ssy (FD pproved for NSL specimens in ptients 43 yers of ge nd older), is one component of  comprehensive surveillnce progrm. It is not intended to dignose infection nor to guide or monitor tretment. Performed t [facility], [address], [place], [place] [address]          Rdiology Studies: No results  found.  Scheduled Meds:  (feeding supplement) PROSource Plus  30 mL Oral BID BM   acidophilus  2 capsule Oral Daily   amLODipine  5 mg Oral Daily   apixaban  5 mg Oral BID   atorvastatin  80 mg Oral Daily   carvedilol  25 mg Oral BID   darbepoetin (ARANESP) injection - DIALYSIS  60 mcg Subcutaneous Q Wed-1800   doxercalciferol  4 mcg Intravenous Q M,W,F-HD   feeding supplement (NEPRO CARB STEADY)  237 mL Oral BID BM   finasteride  5 mg Oral Daily   furosemide  80 mg Oral Once per day on Sunday Tuesday Thursday Saturday   gabapentin  300 mg Oral BID   insulin aspart  0-6 Units Subcutaneous TID WC   isosorbide mononitrate  60 mg Oral Daily   mirtazapine  15 mg Oral QHS   multivitamin  1 tablet Oral QHS   pantoprazole  40 mg Oral Daily   sevelamer carbonate  1,600 mg Oral TID WC   sodium chloride flush  3 mL Intravenous Q12H   tamsulosin  0.4 mg Oral QHS   Continuous Infusions:  sodium chloride     piperacillin-tazobactam (ZOSYN)  IV 2.25 g (06/05/23 1327)     LOS: 15 days    Time spent: oer 30 min    Lacretia Nicks, MD [facility]   To contact the attending provider between 7A-7P or the covering provider during after hours 7P-7A, please log into the web site [email or web address] and access using universal [place] password for that web site. If you do not have the password, please call the hospital operator.  06/05/2023, 3:43 PM

## 2023-06-05 NOTE — Progress Notes (Signed)
Patient has a noted temperature of 101.2, attempted to give tylenol tablet and have patient drink water but he only put the straw in his mouth without sucking, asked if he is agreeable with suppository  to control his fever and he agreed, Dr. Loney Loh informed.  At 2300, administered suppository after cleaning patient post bowel movement.

## 2023-06-05 NOTE — Progress Notes (Signed)
[facility] Progress Note   Subjective:    Seen and examine patient on HD. Tolerating treatment with UFG 1L. BP is 142/67. He is feeling down about proceeding with the amputation.  Objective Vitals:   06/05/23 0830 06/05/23 0900 06/05/23 0930 06/05/23 1000  BP: (!) 142/65 (!) 142/67 (!) 162/69 (!) 141/73  Pulse: 79 80 80 81  Resp: 16 17 20 18   Temp:      TempSrc:      SpO2: 97% 98% 97% 99%  Weight:      Height:       Physical Exam General: Chronically ill appearing older male in NAD Neuro: Oriented X 3 Heart: S1,S2, RRR SR on monitor.  Lungs: CTAB Abdomen: NABS, flat Extremities: Foul smelling Wound L heel. Trace pedal edema Dialysis Access: L AVF + TB  Filed Weights   06/04/23 0500 06/05/23 0500 06/05/23 0807  Weight: 79 kg 75.4 kg 74.9 kg    Intake/Output Summary (Last 24 hours) at 06/05/2023 1028 Last data filed at 06/05/2023 0433 Gross per 24 hour  Intake 629.23 ml  Output --  Net 629.23 ml    Additional Objective Labs: Basic Metabolic Panel: Recent Labs  Lab 05/31/23 0331 06/03/23 1126 06/05/23 0826  NA 131* 130* 131*  K 3.9 4.4 3.9  CL 91* 92* 93*  CO2 25 20* 22  GLUCOSE 98 177* 199*  BUN 39* 59* 44*  CREATININE 6.10* 7.56* 6.14*  CALCIUM 8.4* 8.3* 8.2*  PHOS  --  7.4* 5.7*   Liver Function Tests: Recent Labs  Lab 06/03/23 1126 06/05/23 0826  ALBUMIN 1.8* 1.6*   No results for input(s): "LIPASE", "AMYLASE" in the last 168 hours. CBC: Recent Labs  Lab 05/31/23 0331 06/01/23 0047 06/03/23 1126 06/05/23 0820  WBC 18.1* 16.4* 16.7* 15.4*  HGB 10.1* 10.5* 9.7* 9.2*  HCT 30.9* 32.5* 29.9* 28.8*  MCV 93.1 94.8 91.4 96.0  PLT 248 247 246 254   Blood Culture    Component Value Date/Time   SDES BLOOD BLOOD RIGHT ARM 06/01/2023 1651   SPECREQUEST  06/01/2023 1651    BOTTLES DRAWN AEROBIC ONLY Blood Culture adequate volume   CULT  06/01/2023 1651    NO GROWTH 4 DAYS Performed at [facility], [address],  [place], [place] [address]    REPTSTATUS PENDING 06/01/2023 1651    Cardiac Enzymes: No results for input(s): "CKTOTAL", "CKMB", "CKMBINDEX", "TROPONINI" in the last 168 hours. CBG: Recent Labs  Lab 06/04/23 1142 06/04/23 1636 06/04/23 2145 06/05/23 0658 06/05/23 0732  GLUCAP 150* 235* 173* 197* 214*   Iron Studies: No results for input(s): "IRON", "TIBC", "TRANSFERRIN", "FERRITIN" in the last 72 hours. Lab Results  Component Value Date   INR 1.4 (H) 05/20/2023   INR 1.4 (H) 03/21/2023   INR 1.3 (H) 03/12/2022   Studies/Results: No results found.  Medications:  sodium chloride     piperacillin-tazobactam (ZOSYN)  IV 2.25 g (06/05/23 0511)    (feeding supplement) PROSource Plus  30 mL Oral BID BM   acidophilus  2 capsule Oral Daily   amLODipine  5 mg Oral Daily   apixaban  5 mg Oral BID   atorvastatin  80 mg Oral Daily   carvedilol  25 mg Oral BID   doxercalciferol  4 mcg Intravenous Q M,W,F-HD   feeding supplement (NEPRO CARB STEADY)  237 mL Oral BID BM   finasteride  5 mg Oral Daily   furosemide  80 mg Oral Once per day on  Sunday Tuesday Thursday Saturday   gabapentin  300 mg Oral BID   insulin aspart  0-6 Units Subcutaneous TID WC   isosorbide mononitrate  60 mg Oral Daily   mirtazapine  15 mg Oral QHS   multivitamin  1 tablet Oral QHS   pantoprazole  40 mg Oral Daily   sevelamer carbonate  1,600 mg Oral TID WC   sodium chloride flush  3 mL Intravenous Q12H   tamsulosin  0.4 mg Oral QHS    Dialysis Orders: AF MWF 4 hrs 180NRE 425/800 82.5 kg 2.0 K/ 2.0 Ca AVF - Heparin 5000 units IV three times per week - Hectorol 5 mcg IV three times per week  Assessment/Plan: 1. L heel wound: Plan for L BKA 06/07/2023 per Dr. Lajoyce Corners. ABX per primary. Will schedule patient's HD around scheduled surgery. I think it should be ok to re-schedule his Friday's HD to Sat! 2. ESRD -MWF On HD.  3. Anemia - HGB now 9.2. Will start ESA. 4. Secondary hyperparathyroidism - Correct calcium  on high side. Decrease VDRA. PO4 high, will resume Renvela 2 tabs with meals.  5. HTN/volume - BP is historically labile. High at admit. Now on Amlodipine 5 mg PO daily and Furosemide 80 mg PO on non HD days. Now under OP EDW by weights. Get standing wts with HD. UF as tolerated.  6. Nutrition - Low albumin. On protein supplements 7. PAF-continue Eliquis/Carvedilol 8. DMT2- per primary 9. BPH-per primary 10. FFT-Brother died around early 03/17/2023. He has declined since his brother's death. Not eating. Liberalize diet.   Salome Holmes, NP [facility] 06/05/2023,10:28 AM  LOS: 15 days

## 2023-06-06 DIAGNOSIS — B999 Unspecified infectious disease: Secondary | ICD-10-CM | POA: Diagnosis not present

## 2023-06-06 LAB — CULTURE, BLOOD (ROUTINE X 2)
Culture: NO GROWTH
Culture: NO GROWTH
Special Requests: ADEQUATE
Special Requests: ADEQUATE

## 2023-06-06 LAB — GLUCOSE, CAPILLARY
Glucose-Capillary: 163 mg/dL — ABNORMAL HIGH (ref 70–99)
Glucose-Capillary: 151 mg/dL — ABNORMAL HIGH (ref 70–99)
Glucose-Capillary: 132 mg/dL — ABNORMAL HIGH (ref 70–99)
Glucose-Capillary: 157 mg/dL — ABNORMAL HIGH (ref 70–99)

## 2023-06-06 MED ORDER — VANCOMYCIN VARIABLE DOSE PER UNSTABLE RENAL FUNCTION (PHARMACIST DOSING): Status: DC

## 2023-06-06 MED ORDER — VANCOMYCIN HCL 750 MG/150ML IV SOLN
750.0000 mg | Freq: Once | INTRAVENOUS | Status: AC
  Administered 2023-06-06: 750 mg via INTRAVENOUS
  Filled 2023-06-06: qty 150

## 2023-06-06 NOTE — Progress Notes (Signed)
Family reports patient is slow to wake with anesthesia.

## 2023-06-06 NOTE — Progress Notes (Signed)
Physical Therapy Treatment Patient Details Name: Omri Caponi [facility] MRN: [id number] DOB: 01-15-1951 Today's Date: 06/06/2023   History of Present Illness 72 y.o. male presents to [facility] hospital on 05/20/2023 with chills and diarrhea, found to have fever in ED. Pt admitted for infection of unknown origin. PMH includes ESRD, PAF, HTN, HLD, CAD, PAD, DMII.    PT Comments  Pt was seen for short session to review and practice LE ROM and strengthening, and to encourage him to be up if willing.  Pt is unwilling to sit on side of bed but talked with him about the plan for post op to move.  His family member is a Engineer, civil (consulting), very encouraging and helpful to get him to work.  Follow as tolerated for all PT goals as outlined on POC.    If plan is discharge home, recommend the following: A lot of help with bathing/dressing/bathroom;Assistance with cooking/housework;Direct supervision/assist for medications management;Direct supervision/assist for financial management;Assist for transportation;Help with stairs or ramp for entrance;Two people to help with walking and/or transfers   Can travel by private vehicle     No  Equipment Recommendations  Wheelchair (measurements PT);Wheelchair cushion (measurements PT);Other (comment)    Recommendations for Other Services       Precautions / Restrictions Precautions Precautions: Fall Precaution Comments: L heel wound (prevalon boot), b/b incontinence Restrictions Weight Bearing Restrictions: Yes LLE Weight Bearing: Non weight bearing Other Position/Activity Restrictions: defer WB on LLE given heel wound, also MD plan for LLE amputation on 8/9     Mobility  Bed Mobility               General bed mobility comments: Max of two for moving on bed    Transfers                   General transfer comment: declines to try    Ambulation/Gait                   Stairs             Wheelchair Mobility     Tilt Bed    Modified  Rankin (Stroke Patients Only)       Balance                                            Cognition Arousal: Lethargic Behavior During Therapy: Flat affect, WFL for tasks assessed/performed Overall Cognitive Status: Difficult to assess Area of Impairment: Following commands, Awareness                       Following Commands: Follows one step commands inconsistently, Follows one step commands with increased time Safety/Judgement: Decreased awareness of deficits Awareness: Intellectual Problem Solving: Slow processing, Difficulty sequencing, Requires verbal cues, Requires tactile cues General Comments: Pt is requiring a lot of help to swallow pills, unfocused on ROM to legs and effort to lift and move them.  Easily fatigued        Exercises General Exercises - Lower Extremity Ankle Circles/Pumps: AROM, AAROM, 5 reps Quad Sets: AROM, 5 reps Gluteal Sets: AROM, 10 reps Long Arc Quad: AAROM Heel Slides: AAROM, 10 reps Hip ABduction/ADduction: AAROM, 10 reps Straight Leg Raises: AAROM, 10 reps    General Comments General comments (skin integrity, edema, etc.): Pt was assisted to do ROM and strengthening of legs, but declines to  sit up and is struggling to get a pill down.  Nursing in to check on him, on IV ABT      Pertinent Vitals/Pain Pain Assessment Pain Assessment: Faces Faces Pain Scale: Hurts a little bit Pain Location: LLE sensitivity Pain Descriptors / Indicators: Guarding    Home Living                          Prior Function            PT Goals (current goals can now be found in the care plan section) Acute Rehab PT Goals Patient Stated Goal: to improve strength and return to independence    Frequency    Min 1X/week      PT Plan Current plan remains appropriate    Co-evaluation              AM-PAC PT "6 Clicks" Mobility   Outcome Measure  Help needed turning from your back to your side while in a flat  bed without using bedrails?: A Lot Help needed moving from lying on your back to sitting on the side of a flat bed without using bedrails?: Total Help needed moving to and from a bed to a chair (including a wheelchair)?: Total Help needed standing up from a chair using your arms (e.g., wheelchair or bedside chair)?: Total Help needed to walk in hospital room?: Total Help needed climbing 3-5 steps with a railing? : Total 6 Click Score: 7    End of Session   Activity Tolerance: Patient limited by lethargy;Other (comment);Patient limited by fatigue (fatigue after ex) Patient left: in bed;with call bell/phone within reach;with bed alarm set;with family/visitor present;Other (comment) Nurse Communication: Mobility status;Need for lift equipment;Other (comment) PT Visit Diagnosis: Other abnormalities of gait and mobility (R26.89);Muscle weakness (generalized) (M62.81)     Time: 1610-9604 PT Time Calculation (min) (ACUTE ONLY): 22 min  Charges:    $Therapeutic Exercise: 8-22 mins PT General Charges $$ ACUTE PT VISIT: 1 Visit          Ivar Drape 06/06/2023, 12:41 PM  Samul Dada, PT PhD Acute Rehab Dept. Number: [facility] [id number] and [facility] [phone]

## 2023-06-06 NOTE — Plan of Care (Signed)
  Problem: Health Behavior/Discharge Planning: Goal: Ability to manage health-related needs will improve Outcome: Progressing   Problem: Clinical Measurements: Goal: Ability to maintain clinical measurements within normal limits will improve Outcome: Progressing Goal: Will remain free from infection Outcome: Progressing Goal: Respiratory complications will improve Outcome: Progressing Goal: Cardiovascular complication will be avoided Outcome: Progressing   Problem: Activity: Goal: Risk for activity intolerance will decrease Outcome: Progressing   Problem: Nutrition: Goal: Adequate nutrition will be maintained Outcome: Progressing   Problem: Coping: Goal: Level of anxiety will decrease Outcome: Progressing   Problem: Elimination: Goal: Will not experience complications related to bowel motility Outcome: Progressing   Problem: Pain Managment: Goal: General experience of comfort will improve Outcome: Progressing   Problem: Safety: Goal: Ability to remain free from injury will improve Outcome: Progressing   Problem: Skin Integrity: Goal: Risk for impaired skin integrity will decrease Outcome: Progressing

## 2023-06-06 NOTE — Progress Notes (Signed)
[facility] Progress Note   Subjective:    Seen and examined patient at bedside. More awake today. He denies any acute issues. Noted he's been having intermittent fevers. Vanc has been added and remains on Zosyn. Tolerated yesterday's HD with net UF 1L. Plan for left transtibial amputation tomorrow. Patient will be scheduled for HD on Saturday (06/08/23).   Objective Vitals:   06/05/23 1838 06/05/23 2134 06/06/23 0527 06/06/23 0953  BP:  (!) 103/50 (!) 132/58 (!) 135/57  Pulse:  78 78 78  Resp:  16 18 20   Temp: (!) 100.6 F (38.1 C) 99.9 F (37.7 C) 97.9 F (36.6 C) 100 F (37.8 C)  TempSrc: Axillary   Oral  SpO2:  97% 99% 96%  Weight:      Height:       Physical Exam General: Chronically ill appearing older male in NAD Neuro: Oriented X 3 Heart: S1,S2, RRR SR on monitor.  Lungs: CTAB Abdomen: NABS, flat Extremities: Foul smelling Wound L heel. Trace pedal edema Dialysis Access: L AVF + TB  Filed Weights   06/04/23 0500 06/05/23 0500 06/05/23 0807  Weight: 79 kg 75.4 kg 74.9 kg    Intake/Output Summary (Last 24 hours) at 06/06/2023 1458 Last data filed at 06/06/2023 0527 Gross per 24 hour  Intake 317.83 ml  Output 0 ml  Net 317.83 ml    Additional Objective Labs: Basic Metabolic Panel: Recent Labs  Lab 06/03/23 1126 06/05/23 0826 06/06/23 0750  NA 130* 131* 134*  K 4.4 3.9 4.1  CL 92* 93* 93*  CO2 20* 22 26  GLUCOSE 177* 199* 185*  BUN 59* 44* 29*  CREATININE 7.56* 6.14* 4.63*  CALCIUM 8.3* 8.2* 8.5*  PHOS 7.4* 5.7* 3.8   Liver Function Tests: Recent Labs  Lab 06/03/23 1126 06/05/23 0826  ALBUMIN 1.8* 1.6*   No results for input(s): "LIPASE", "AMYLASE" in the last 168 hours. CBC: Recent Labs  Lab 05/31/23 0331 06/01/23 0047 06/03/23 1126 06/05/23 0820 06/06/23 0750  WBC 18.1* 16.4* 16.7* 15.4* 17.5*  HGB 10.1* 10.5* 9.7* 9.2* 9.2*  HCT 30.9* 32.5* 29.9* 28.8* 29.0*  MCV 93.1 94.8 91.4 96.0 93.5  PLT 248 247 246 254 248    Blood Culture    Component Value Date/Time   SDES BLOOD BLOOD RIGHT ARM 06/01/2023 1651   SPECREQUEST  06/01/2023 1651    BOTTLES DRAWN AEROBIC ONLY Blood Culture adequate volume   CULT  06/01/2023 1651    NO GROWTH 5 DAYS Performed at [facility], [address] [place], [place] [address]    REPTSTATUS 06/06/2023 FINAL 06/01/2023 1651    Cardiac Enzymes: No results for input(s): "CKTOTAL", "CKMB", "CKMBINDEX", "TROPONINI" in the last 168 hours. CBG: Recent Labs  Lab 06/05/23 0732 06/05/23 1720 06/05/23 2140 06/06/23 0829 06/06/23 1143  GLUCAP 214* 134* 180* 163* 151*   Iron Studies: No results for input(s): "IRON", "TIBC", "TRANSFERRIN", "FERRITIN" in the last 72 hours. Lab Results  Component Value Date   INR 1.4 (H) 05/20/2023   INR 1.4 (H) 03/21/2023   INR 1.3 (H) 03/12/2022   Studies/Results: No results found.  Medications:  sodium chloride     piperacillin-tazobactam (ZOSYN)  IV 2.25 g (06/06/23 0545)    (feeding supplement) PROSource Plus  30 mL Oral BID BM   acidophilus  2 capsule Oral Daily   amLODipine  5 mg Oral Daily   apixaban  5 mg Oral BID   atorvastatin  80 mg Oral Daily  carvedilol  25 mg Oral BID   darbepoetin (ARANESP) injection - DIALYSIS  60 mcg Subcutaneous Q Wed-1800   doxercalciferol  4 mcg Intravenous Q M,W,F-HD   feeding supplement (NEPRO CARB STEADY)  237 mL Oral BID BM   finasteride  5 mg Oral Daily   furosemide  80 mg Oral Once per day on Sunday Tuesday Thursday Saturday   gabapentin  300 mg Oral BID   insulin aspart  0-6 Units Subcutaneous TID WC   isosorbide mononitrate  60 mg Oral Daily   mirtazapine  15 mg Oral QHS   multivitamin  1 tablet Oral QHS   pantoprazole  40 mg Oral Daily   sevelamer carbonate  1,600 mg Oral TID WC   sodium chloride flush  3 mL Intravenous Q12H   tamsulosin  0.4 mg Oral QHS   vancomycin variable dose per unstable renal function (pharmacist dosing)   Does not apply See admin instructions     Dialysis Orders: AF MWF 4 hrs 180NRE 425/800 82.5 kg 2.0 K/ 2.0 Ca AVF - Heparin 5000 units IV three times per week - Hectorol 5 mcg IV three times per week  Assessment/Plan: 1. L heel wound: Plan for L BKA 06/07/2023 per Dr. Lajoyce Corners. ABXs per primary. Will schedule patient's HD around scheduled surgery. Next HD will be scheduled for Saturday 06/08/23. 2. ESRD -MWF, off schedule 2nd surgery, see above.  3. Anemia - HGB now 9.2. ESA given on 8/7, monitor trend, titrate up dose if needed. 4. Secondary hyperparathyroidism - Correct calcium on high side. Decrease VDRA. PO4 high, will resume Renvela 2 tabs with meals.  5. HTN/volume - BP is historically labile. High at admit. Now on Amlodipine 5 mg PO daily and Furosemide 80 mg PO on non HD days. Now under OP EDW by weights. Get standing wts with HD. UF as tolerated.  6. Nutrition - Low albumin. On protein supplements 7. PAF-continue Eliquis/Carvedilol 8. DMT2- per primary 9. BPH-per primary 10. FFT-Brother died around early 2023/03/20. He has declined since his brother's death. Not eating. Liberalize diet.   Salome Holmes, NP [facility] 06/06/2023,2:58 PM  LOS: 16 days

## 2023-06-06 NOTE — Progress Notes (Signed)
OT Cancellation Note  Patient Details Name: William Webb [facility] MRN: [id number] DOB: [birth year]   Cancelled Treatment:    Reason Eval/Treat Not Completed: Other (comment)Pt declinding OOB activities at this time, set up for bed level ADLs with grooming/feeding, amputation surgery scheduled tomorrow, plan to re-eval this weekend or when able.   Alexis Goodell 06/06/2023, 1:23 PM

## 2023-06-06 NOTE — Progress Notes (Signed)
Pharmacy Antibiotic Note  William Webb is a 72 y.o. male admitted on 05/20/2023 with  sepsis .  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 Planning L BKA this admit. Pharmacy has been consulted for Zosyn dosing and to resume vancomycin on 8/8 as patient remains febrile. Of note, patient is on HD MWF, last dose of vancomycin given 8/3. Last HD session was 8/7.   Plan: Continue Zosyn 2.25g IV q8h  Give vancomycin 750mg  IV x1 now (HD yesterday, no doses given) Monitor HD plans for further vanc dosing- HD on 8/9 will likely get bumped to 8/10 for OR Continue to monitor clinical improvement, abx LOT  Height: 6\' 3"  (190.5 cm) Weight: 74.9 kg (165 lb 2 oz) IBW/kg (Calculated) : 84.5  Temp (24hrs), Avg:100 F (37.8 C), Min:97.9 F (36.6 C), Max:102 F (38.9 C)  Recent Labs  Lab 05/31/23 0331 06/01/23 0047 06/03/23 1126 06/05/23 0820 06/05/23 0826 06/06/23 0750  WBC 18.1* 16.4* 16.7* 15.4*  --  17.5*  CREATININE 6.10*  --  7.56*  --  6.14* 4.63*    Estimated Creatinine Clearance: 15.1 mL/min (A) (by C-G formula based on SCr of 4.63 mg/dL (H)).    No Known Allergies  Antimicrobials this admission: CTX 7/22 >> 7/28 Azithro 7/27 >> 7/28 Clindamycin 8/3 >> 8/4 Vancomycin 8/3 >> 8/5; 8/8 >>  Zosyn 8/3 >>  Dose adjustments this admission: None  Microbiology results: 7/22 BCx: neg 7/23 C.Diff: neg 7/23 GI Panel: neg 7/23 MRSA PCR: neg 8/3 BCx: ngtd  Thank you for allowing pharmacy to be a part of this patient's care.  Rexford Maus, PharmD, BCPS 06/06/2023 9:16 AM

## 2023-06-06 NOTE — Progress Notes (Signed)
**Note De-Identified vi Obfusction** PROGRESS NOTE    Moeez Ymmoto [facility]  BMW:[id number] DOB: Februry 25, 1952 DOA: 05/20/2023 PCP: Irven Coe, MD  Chief Complint  Ptient presents with   Ftigue    Brief Nrrtive:   72 yer old with  history of ESRD on HD MWF, PAF on Eliquis, HTN, HLD, CAD, PAD, nd DM2 who presented to the ER 7/22 with chills nd n episode of loose stool t home. Initil workup to include TTE nd CT scns did not revel  cler source for his fever.   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)   Severe protein-clorie mlnutrition (HCC)   Peripherl rtery disese (HCC)   Proxysml tril fibrilltion (HCC)   Chronic disese nemi   ESRD (end stge renl disese) (HCC)   Internl hemorrhoid   History of CAD (coronry rtery disese)   Pulmonry nodules   Pressure injury of skin   Acute osteomyelitis of left clcneus (HCC)  Gngrene of the left heel  Fever First pprecited 8/3 AM -plin film x-ry suggested possibility of soft tissue necrotizing infection - Poditry ws contcted nd deferred to Orthopedics - ntibiotic coverge brodened - fortuntely MRI did not suggest necrotizing fsciitis - Ortho plnning on left trnstibil mputtion 06/07/2023 Continued fevers, will dd vncomycin, continue zosyn Prior to discovery of L heel gngrene, hd undergone n extensive inptient evlution for fever without  source -left AV fistul helthy without ny signs of infection -negtive COVID x 2 -CXR unreveling -CT bdomen without evidence of cute intr-bdominl process -CT chest reveled some scttered right lung nodules possibly representing infectious or inflmmtory etiology with fungl workup underwy -blood cultures negtive x 2 -ws dosed with empiric Rocephin for 5 dys without significnt chnge to persistent  fever -ID hs been consulted -TTE without evidence of gross endocrditis -no significnt lymphdenopthy noted on CT scnning -plin x-rys of the right hip without cute findings -ultimtely the ptient ws discovered to hve  left heel wound which likely represents the source of his fever   Loose stool C. difficile screen with PCR nd GI pnel both negtive -possibly simply n inflmmtory rection to his illness nd fever -Imodium s needed   Asymptomtic cholelithisis Noted incidentlly on CT bdomen nd pelvis    Incidentl finding of lung nodules Noted during CT bdomen nd pelvis to include solid nodules of the right lower nd right middle lobe - CT chest/bdom/pelvix 7/30 confirmed prt solid 16mm RML nd 11mm RLL nodules fvored to represent infectious/inflmmtory etiology - follow-up noncontrst CT chest is recommended in 3-6 months for  follow-up - Dr. Rito Ehrlich hs lredy mde n mbultory referrl to Pulmonry   ESRD on HD MWF Nephrology following - HD per Nephrology    HTN Controlled t this time    PAF Continue chronic pixbn nd Coreg - rte controlled    History of CAD nd history of PAD Asymptomtic   DM2 Currently on SSI 6 on 02/2023   Peripherl neuropthy Continue usul Neurontin dose   BPH Continue usul Flomx nd Proscr   Mild hypontremi Follow, volume per renl with HD   Situtionl depression The ptient suffered the deth of his brother pproximtely 2 months go nd fmily reports tht he hs been in  stte of decline since - tril of Remeron hs been initited during this hospitl sty - [place] consulted  DVT prophylaxis: eliquis Code Status: full Family Communication: none Disposition:   Status is: Inpatient Remains inpatient appropriate because: continued need for inpatient care   Consultants:  Orthopedics renal  Procedures:  none  Antimicrobials:  Anti-infectives (From admission, onward)    Start     Dose/Rate  Route Frequency Ordered Stop   06/04/23 0600  piperacillin-tazobactam (ZOSYN) IVPB 2.25 g        2.25 g 100 mL/hr over 30 Minutes Intravenous Every 8 hours 06/03/23 2156     06/03/23 1200  vancomycin (VANCOREADY) IVPB 750 mg/150 mL  Status:  Discontinued        750 mg 150 mL/hr over 60 Minutes Intravenous Every M-W-F (Hemodialysis) 06/01/23 1644 06/03/23 0901   06/01/23 1730  piperacillin-tazobactam (ZOSYN) IVPB 2.25 g  Status:  Discontinued        2.25 g 100 mL/hr over 30 Minutes Intravenous Every 8 hours 06/01/23 1633 06/03/23 2156   06/01/23 1730  vancomycin (VANCOREADY) IVPB 1750 mg/350 mL        1,750 mg 175 mL/hr over 120 Minutes Intravenous  Once 06/01/23 1633 06/01/23 2137   06/01/23 1700  clindamycin (CLEOCIN) IVPB 600 mg  Status:  Discontinued        600 mg 100 mL/hr over 30 Minutes Intravenous Every 8 hours 06/01/23 1602 06/02/23 1306   05/25/23 1500  azithromycin (ZITHROMAX) 500 mg in sodium chloride 0.9 % 250 mL IVPB  Status:  Discontinued        500 mg 250 mL/hr over 60 Minutes Intravenous Every 24 hours 05/25/23 1320 05/26/23 1802   05/21/23 1000  cefTRIAXone (ROCEPHIN) 2 g in sodium chloride 0.9 % 100 mL IVPB  Status:  Discontinued        2 g 200 mL/hr over 30 Minutes Intravenous Every 24 hours 05/21/23 0154 05/26/23 1802   05/21/23 0145  vancomycin (VANCOREADY) IVPB 1750 mg/350 mL        1,750 mg 175 mL/hr over 120 Minutes Intravenous  Once 05/21/23 0139 05/21/23 0640   05/20/23 2345  cefTRIAXone (ROCEPHIN) 1 g in sodium chloride 0.9 % 100 mL IVPB        1 g 200 mL/hr over 30 Minutes Intravenous  Once 05/20/23 2334 05/21/23 0030       Subjective: Sleeping Discussed with daughter at bedside   Objective: Vitals:   06/05/23 1724 06/05/23 1838 06/05/23 2134 06/06/23 0527  BP: (!) 129/91  (!) 103/50 (!) 132/58  Pulse: 86  78 78  Resp: 18  16 18   Temp: (!) 102 F (38.9 C) (!) 100.6 F (38.1 C) 99.9 F (37.7 C) 97.9 F (36.6 C)  TempSrc: Oral Axillary     SpO2: 98%  97% 99%  Weight:      Height:        Intake/Output Summary (Last 24 hours) at 06/06/2023 0913 Last data filed at 06/06/2023 0527 Gross per 24 hour  Intake 317.83 ml  Output 1000 ml  Net -682.17 ml   Filed Weights   06/04/23 0500 06/05/23 0500 06/05/23 0807  Weight: 79 kg 75.4 kg 74.9 kg    Examination:  General: No acute distress. Cardiovascular: RRR Lungs: unlabored Neurological: sleeping when I entered room, briefly opened eyes and mumbled, but quickly back to sleep Extremities: dressing to LLE  Data Reviewed: I have personally reviewed following labs and imaging studies  CBC: Recent Labs  Lab 05/31/23 0331 06/01/23 0047 06/03/23 1126 06/05/23 0820 06/06/23 0750  WBC 18.1* 16.4* 16.7* 15.4* 17.5*  HGB  10.1* 10.5* 9.7* 9.2* 9.2*  HCT 30.9* 32.5* 29.9* 28.8* 29.0*  MCV 93.1 94.8 91.4 96.0 93.5  PLT 248 247 246 254 248    Basic Metabolic Panel: Recent Labs  Lab 05/31/23 0331 06/03/23 1126 06/05/23 0826 06/06/23 0750  N 131* 130* 131* 134*  K 3.9 4.4 3.9 4.1  CL 91* 92* 93* 93*  CO2 25 20* 22 26  GLUCOSE 98 177* 199* 185*  BUN 39* 59* 44* 29*  CRETININE 6.10* 7.56* 6.14* 4.63*  CLCIUM 8.4* 8.3* 8.2* 8.5*  MG  --   --   --  1.7  PHOS  --  7.4* 5.7* 3.8    GFR: Estimated Creatinine Clearance: 15.1 mL/min () (by C-G formula based on SCr of 4.63 mg/dL (H)).  Liver Function Tests: Recent Labs  Lab 06/03/23 1126 06/05/23 0826  LBUMIN 1.8* 1.6*    CBG: Recent Labs  Lab 06/05/23 0658 06/05/23 0732 06/05/23 1720 06/05/23 2140 06/06/23 0829  GLUCP 197* 214* 134* 180* 163*     Recent Results (from the past 240 hour(s))  Blastomyces ntigen     Status: None   Collection Time: 05/30/23 12:55 M   Specimen: Blood  Result Value Ref Range Status   Blastomyces ntigen None Detected None Detected ng/mL Final    Comment: (NOTE) Reference Interval: None Detected Reportable Range: 0.31 ng/mL - 20.00 ng/mL Results above 20.00 ng/mL  are reported as 'Positive, bove the Limit of Quantification' This test was developed and its performance characteristics determined by [facility]. It has not been cleared or approved by the FD; however, FD clearance or approval is not currently required for clinical use. The results are not intended to be used as the sole means for clinical diagnosis or patient decisions.    Specimen Type SERUM  Final    Comment: (NOTE) Performed t: [facility] [address] [place], [place] [id number] Roxanne Gates MD WU:[id number]   Culture, blood (Routine X 2) w Reflex to ID Panel     Status: None (Preliminary result)   Collection Time: 06/01/23  4:49 PM   Specimen: BLOOD  Result Value Ref Range Status   Specimen Description BLOOD BLOOD RIGHT HND  Final   Special Requests   Final    BOTTLES DRWN EROBIC ND NEROBIC Blood Culture adequate volume   Culture   Final    NO GROWTH 4 DYS Performed at [facility], [address], [place], [place] [address]    Report Status PENDING  Incomplete  Culture, blood (Routine X 2) w Reflex to ID Panel     Status: None (Preliminary result)   Collection Time: 06/01/23  4:51 PM   Specimen: BLOOD  Result Value Ref Range Status   Specimen Description BLOOD BLOOD RIGHT RM  Final   Special Requests   Final    BOTTLES DRWN EROBIC ONLY Blood Culture adequate volume   Culture   Final    NO GROWTH 4 DYS Performed at [facility], [address], [place], [place] [address]    Report Status PENDING  Incomplete  Surgical pcr screen     Status: None   Collection Time: 06/01/23  9:56 PM   Specimen: Nasal Mucosa; Nasal Swab  Result Value Ref Range Status   MRS, PCR NEGTIVE NEGTIVE Final   Staphylococcus aureus NEGTIVE NEGTIVE Final    Comment: (NOTE) The Xpert S ssay (FD approved for NSL specimens in patients 14 years of age and older), is one **Note De-Identified vi Obfusction** component of  comprehensive surveillnce progrm. It is not  intended to dignose infection nor to guide or monitor tretment. Performed t [facility], [address], [place], [place] [address]          Rdiology Studies: No results found.      Scheduled Meds:  (feeding supplement) PROSource Plus  30 mL Orl BID BM   cidophilus  2 cpsule Orl Dily   mLODipine  5 mg Orl Dily   pixbn  5 mg Orl BID   torvsttin  80 mg Orl Dily   crvedilol  25 mg Orl BID   drbepoetin (ARANESP) injection - DIALYSIS  60 mcg Subcutneous Q Wed-1800   doxerclciferol  4 mcg Intrvenous Q M,W,F-HD   feeding supplement (NEPRO CARB STEADY)  237 mL Orl BID BM   finsteride  5 mg Orl Dily   furosemide  80 mg Orl Once per dy on Sundy Tuesdy Thursdy Sturdy   gbpentin  300 mg Orl BID   insulin sprt  0-6 Units Subcutneous TID WC   isosorbide mononitrte  60 mg Orl Dily   mirtzpine  15 mg Orl QHS   multivitmin  1 tblet Orl QHS   pntoprzole  40 mg Orl Dily   sevelmer crbonte  1,600 mg Orl TID WC   sodium chloride flush  3 mL Intrvenous Q12H   tmsulosin  0.4 mg Orl QHS   Continuous Infusions:  sodium chloride     pipercillin-tzobctm (ZOSYN)  IV 2.25 g (06/06/23 0545)     LOS: 16 dys    Time spent: oer 30 min    Lcreti Nicks, MD [facility]   To contct the ttending provider between 7A-7P or the covering provider during fter hours 7P-7A, plese log into the web site www.mion.com nd ccess using [facility] [place] pssword for tht web site. If you do not hve the pssword, plese cll the hospitl opertor.  06/06/2023, 9:13 AM

## 2023-06-07 ENCOUNTER — Encounter (HOSPITAL_COMMUNITY): Payer: Self-pay | Admitting: Internal Medicine

## 2023-06-07 ENCOUNTER — Other Ambulatory Visit: Payer: Self-pay

## 2023-06-07 ENCOUNTER — Inpatient Hospital Stay (HOSPITAL_COMMUNITY): Payer: Medicare Other | Admitting: Anesthesiology

## 2023-06-07 ENCOUNTER — Encounter (HOSPITAL_COMMUNITY)
Admission: EM | Disposition: A | Discharge status: Skilled Nursing Facility | Payer: Self-pay | Source: Home / Self Care | Attending: Internal Medicine

## 2023-06-07 DIAGNOSIS — M868X7 Other osteomyelitis, ankle and foot: Secondary | ICD-10-CM

## 2023-06-07 DIAGNOSIS — E1169 Type 2 diabetes mellitus with other specified complication: Secondary | ICD-10-CM | POA: Diagnosis not present

## 2023-06-07 DIAGNOSIS — I120 Hypertensive chronic kidney disease with stage 5 chronic kidney disease or end stage renal disease: Secondary | ICD-10-CM | POA: Diagnosis not present

## 2023-06-07 DIAGNOSIS — Z992 Dependence on renal dialysis: Secondary | ICD-10-CM

## 2023-06-07 DIAGNOSIS — N186 End stage renal disease: Secondary | ICD-10-CM

## 2023-06-07 DIAGNOSIS — E119 Type 2 diabetes mellitus without complications: Secondary | ICD-10-CM | POA: Diagnosis not present

## 2023-06-07 DIAGNOSIS — M86172 Other acute osteomyelitis, left ankle and foot: Secondary | ICD-10-CM | POA: Diagnosis not present

## 2023-06-07 DIAGNOSIS — E43 Unspecified severe protein-calorie malnutrition: Secondary | ICD-10-CM | POA: Diagnosis not present

## 2023-06-07 DIAGNOSIS — B999 Unspecified infectious disease: Secondary | ICD-10-CM | POA: Diagnosis not present

## 2023-06-07 HISTORY — PX: AMPUTATION: SHX166

## 2023-06-07 LAB — BASIC METABOLIC PANEL
Anion gap: 13 (ref 5–15)
BUN: 44 mg/dL — ABNORMAL HIGH (ref 8–23)
CO2: 25 mmol/L (ref 22–32)
Calcium: 8.6 mg/dL — ABNORMAL LOW (ref 8.9–10.3)
Chloride: 94 mmol/L — ABNORMAL LOW (ref 98–111)
Creatinine, Ser: 6.22 mg/dL — ABNORMAL HIGH (ref 0.61–1.24)
GFR, Estimated: 9 mL/min — ABNORMAL LOW (ref >60)
Glucose, Bld: 144 mg/dL — ABNORMAL HIGH (ref 70–99)
Potassium: 4.3 mmol/L (ref 3.5–5.1)
Sodium: 132 mmol/L — ABNORMAL LOW (ref 135–145)

## 2023-06-07 LAB — GLUCOSE, CAPILLARY
Glucose-Capillary: 145 mg/dL — ABNORMAL HIGH (ref 70–99)
Glucose-Capillary: 124 mg/dL — ABNORMAL HIGH (ref 70–99)
Glucose-Capillary: 133 mg/dL — ABNORMAL HIGH (ref 70–99)
Glucose-Capillary: 133 mg/dL — ABNORMAL HIGH (ref 70–99)
Glucose-Capillary: 151 mg/dL — ABNORMAL HIGH (ref 70–99)
Glucose-Capillary: 186 mg/dL — ABNORMAL HIGH (ref 70–99)
Glucose-Capillary: 289 mg/dL — ABNORMAL HIGH (ref 70–99)

## 2023-06-07 LAB — CBC
HCT: 29.9 % — ABNORMAL LOW (ref 39.0–52.0)
Hemoglobin: 9.6 g/dL — ABNORMAL LOW (ref 13.0–17.0)
MCH: 30.8 pg (ref 26.0–34.0)
MCHC: 32.1 g/dL (ref 30.0–36.0)
MCV: 95.8 fL (ref 80.0–100.0)
Platelets: 277 10*3/uL (ref 150–400)
RBC: 3.12 MIL/uL — ABNORMAL LOW (ref 4.22–5.81)
RDW: 14.6 % (ref 11.5–15.5)
WBC: 18.6 10*3/uL — ABNORMAL HIGH (ref 4.0–10.5)
nRBC: 0 % (ref 0.0–0.2)

## 2023-06-07 SURGERY — AMPUTATION BELOW KNEE
Anesthesia: Regional | Site: Knee | Laterality: Left

## 2023-06-07 MED ORDER — OXYCODONE HCL 5 MG PO TABS: 5.0000 mg | ORAL_TABLET | ORAL | Status: DC | PRN

## 2023-06-07 MED ORDER — 0.9 % SODIUM CHLORIDE (POUR BTL) OPTIME
TOPICAL | Status: DC | PRN
Start: 2023-06-07 — End: 2023-06-07
  Administered 2023-06-07: 1000 mL

## 2023-06-07 MED ORDER — BISACODYL 5 MG PO TBEC: 5.0000 mg | DELAYED_RELEASE_TABLET | Freq: Every day | ORAL | Status: DC | PRN

## 2023-06-07 MED ORDER — POTASSIUM CHLORIDE CRYS ER 20 MEQ PO TBCR: 20.0000 meq | EXTENDED_RELEASE_TABLET | Freq: Every day | ORAL | Status: DC | PRN

## 2023-06-07 MED ORDER — CHLORHEXIDINE GLUCONATE 0.12 % MT SOLN: 15.0000 mL | Freq: Once | OROMUCOSAL | Status: DC

## 2023-06-07 MED ORDER — HYDRALAZINE HCL 20 MG/ML IJ SOLN: 5.0000 mg | INTRAMUSCULAR | Status: DC | PRN

## 2023-06-07 MED ORDER — TRANEXAMIC ACID-NACL 1000-0.7 MG/100ML-% IV SOLN
1000.0000 mg | INTRAVENOUS | Status: AC
  Administered 2023-06-07: 1000 mg via INTRAVENOUS

## 2023-06-07 MED ORDER — DEXAMETHASONE SODIUM PHOSPHATE 10 MG/ML IJ SOLN
INTRAMUSCULAR | Status: DC | PRN
  Administered 2023-06-07: 10 mg

## 2023-06-07 MED ORDER — CEFAZOLIN SODIUM-DEXTROSE 2-4 GM/100ML-% IV SOLN: 2.0000 g | Freq: Three times a day (TID) | INTRAVENOUS | Status: DC

## 2023-06-07 MED ORDER — TRANEXAMIC ACID 1000 MG/10ML IV SOLN
2000.0000 mg | INTRAVENOUS | Status: DC
  Filled 2023-06-07: qty 20

## 2023-06-07 MED ORDER — SODIUM CHLORIDE 0.9 % IV SOLN: INTRAVENOUS | Status: DC

## 2023-06-07 MED ORDER — ACETAMINOPHEN 500 MG PO TABS
ORAL_TABLET | ORAL | Status: AC
  Filled 2023-06-07: qty 2

## 2023-06-07 MED ORDER — MAGNESIUM SULFATE 2 GM/50ML IV SOLN: 2.0000 g | Freq: Every day | INTRAVENOUS | Status: DC | PRN

## 2023-06-07 MED ORDER — ONDANSETRON HCL 4 MG/2ML IJ SOLN: 4.0000 mg | Freq: Four times a day (QID) | INTRAMUSCULAR | Status: DC | PRN

## 2023-06-07 MED ORDER — MIDAZOLAM HCL 2 MG/2ML IJ SOLN
INTRAMUSCULAR | Status: AC
  Filled 2023-06-07: qty 2

## 2023-06-07 MED ORDER — ALUM & MAG HYDROXIDE-SIMETH 200-200-20 MG/5ML PO SUSP: 15.0000 mL | ORAL | Status: DC | PRN

## 2023-06-07 MED ORDER — FENTANYL CITRATE (PF) 100 MCG/2ML IJ SOLN
INTRAMUSCULAR | Status: AC
  Administered 2023-06-07: 50 ug via INTRAVENOUS
  Filled 2023-06-07: qty 2

## 2023-06-07 MED ORDER — ORAL CARE MOUTH RINSE: 15.0000 mL | Freq: Once | OROMUCOSAL | Status: DC

## 2023-06-07 MED ORDER — POLYETHYLENE GLYCOL 3350 17 G PO PACK: 17.0000 g | PACK | Freq: Every day | ORAL | Status: DC | PRN

## 2023-06-07 MED ORDER — OXYCODONE HCL 5 MG PO TABS: 10.0000 mg | ORAL_TABLET | ORAL | Status: DC | PRN

## 2023-06-07 MED ORDER — CHLORHEXIDINE GLUCONATE 0.12 % MT SOLN
OROMUCOSAL | Status: AC
  Administered 2023-06-07: 15 mL
  Filled 2023-06-07: qty 15

## 2023-06-07 MED ORDER — METOPROLOL TARTRATE 5 MG/5ML IV SOLN: 2.0000 mg | INTRAVENOUS | Status: DC | PRN

## 2023-06-07 MED ORDER — CHLORHEXIDINE GLUCONATE 4 % EX SOLN: 60.0000 mL | Freq: Once | CUTANEOUS | Status: DC

## 2023-06-07 MED ORDER — POVIDONE-IODINE 10 % EX SWAB
2.0000 | Freq: Once | CUTANEOUS | Status: AC
  Administered 2023-06-07: 2 via TOPICAL

## 2023-06-07 MED ORDER — FENTANYL CITRATE (PF) 100 MCG/2ML IJ SOLN: 25.0000 ug | INTRAMUSCULAR | Status: DC | PRN

## 2023-06-07 MED ORDER — ACETAMINOPHEN 325 MG PO TABS
325.0000 mg | ORAL_TABLET | Freq: Four times a day (QID) | ORAL | Status: DC | PRN
  Administered 2023-06-10: 650 mg via ORAL
  Filled 2023-06-07: qty 2

## 2023-06-07 MED ORDER — DOCUSATE SODIUM 100 MG PO CAPS
100.0000 mg | ORAL_CAPSULE | Freq: Every day | ORAL | Status: DC
  Administered 2023-06-07 – 2023-06-11 (×4): 100 mg via ORAL
  Filled 2023-06-07 (×4): qty 1

## 2023-06-07 MED ORDER — MAGNESIUM CITRATE PO SOLN: 1.0000 | Freq: Once | ORAL | Status: DC | PRN

## 2023-06-07 MED ORDER — BUPIVACAINE-EPINEPHRINE (PF) 0.5% -1:200000 IJ SOLN
INTRAMUSCULAR | Status: DC | PRN
  Administered 2023-06-07: 10 mL via PERINEURAL
  Administered 2023-06-07: 30 mL via PERINEURAL

## 2023-06-07 MED ORDER — JUVEN PO PACK
1.0000 | PACK | Freq: Two times a day (BID) | ORAL | Status: DC
  Administered 2023-06-07 – 2023-06-09 (×2): 1 via ORAL
  Filled 2023-06-07 (×4): qty 1

## 2023-06-07 MED ORDER — LABETALOL HCL 5 MG/ML IV SOLN: 10.0000 mg | INTRAVENOUS | Status: DC | PRN

## 2023-06-07 MED ORDER — TRANEXAMIC ACID-NACL 1000-0.7 MG/100ML-% IV SOLN
INTRAVENOUS | Status: AC
  Filled 2023-06-07: qty 100

## 2023-06-07 MED ORDER — ACETAMINOPHEN 10 MG/ML IV SOLN
INTRAVENOUS | Status: AC
  Filled 2023-06-07: qty 100

## 2023-06-07 MED ORDER — CHLORHEXIDINE GLUCONATE CLOTH 2 % EX PADS
6.0000 | MEDICATED_PAD | Freq: Every day | CUTANEOUS | Status: DC
  Administered 2023-06-08 – 2023-06-09 (×2): 6 via TOPICAL

## 2023-06-07 MED ORDER — ACETAMINOPHEN 500 MG PO TABS: 1000.0000 mg | ORAL_TABLET | Freq: Once | ORAL | Status: DC

## 2023-06-07 MED ORDER — CEFAZOLIN SODIUM-DEXTROSE 2-4 GM/100ML-% IV SOLN
2.0000 g | INTRAVENOUS | Status: AC
  Administered 2023-06-07: 2 g via INTRAVENOUS
  Filled 2023-06-07: qty 100

## 2023-06-07 MED ORDER — VASHE WOUND IRRIGATION OPTIME
TOPICAL | Status: DC | PRN
  Administered 2023-06-07: 34 [oz_av]

## 2023-06-07 MED ORDER — ZINC SULFATE 220 (50 ZN) MG PO CAPS
220.0000 mg | ORAL_CAPSULE | Freq: Every day | ORAL | Status: DC
  Administered 2023-06-07 – 2023-06-11 (×4): 220 mg via ORAL
  Filled 2023-06-07 (×4): qty 1

## 2023-06-07 MED ORDER — GUAIFENESIN-DM 100-10 MG/5ML PO SYRP: 15.0000 mL | ORAL_SOLUTION | ORAL | Status: DC | PRN

## 2023-06-07 MED ORDER — PHENOL 1.4 % MT LIQD: 1.0000 | OROMUCOSAL | Status: DC | PRN

## 2023-06-07 MED ORDER — VITAMIN C 500 MG PO TABS
1000.0000 mg | ORAL_TABLET | Freq: Every day | ORAL | Status: DC
  Administered 2023-06-07 – 2023-06-11 (×4): 1000 mg via ORAL
  Filled 2023-06-07 (×4): qty 2

## 2023-06-07 MED ORDER — PROPOFOL 500 MG/50ML IV EMUL
INTRAVENOUS | Status: DC | PRN
  Administered 2023-06-07: 100 ug/kg/min via INTRAVENOUS

## 2023-06-07 MED ORDER — FENTANYL CITRATE (PF) 100 MCG/2ML IJ SOLN: 50.0000 ug | Freq: Once | INTRAMUSCULAR | Status: AC

## 2023-06-07 MED ORDER — PANTOPRAZOLE SODIUM 40 MG PO TBEC: 40.0000 mg | DELAYED_RELEASE_TABLET | Freq: Every day | ORAL | Status: DC

## 2023-06-07 MED ORDER — ACETAMINOPHEN 10 MG/ML IV SOLN
INTRAVENOUS | Status: DC | PRN
Start: 2023-06-07 — End: 2023-06-07
  Administered 2023-06-07: 1000 mg via INTRAVENOUS

## 2023-06-07 MED ORDER — HYDROMORPHONE HCL 1 MG/ML IJ SOLN: 0.5000 mg | INTRAMUSCULAR | Status: DC | PRN

## 2023-06-07 SURGICAL SUPPLY — 40 items
BAG COUNTER SPONGE SURGICOUNT (BAG) IMPLANT
BAG SPNG CNTER NS LX DISP (BAG)
BLADE SAW RECIP 87.9 MT (BLADE) ×1 IMPLANT
BLADE SURG 21 STRL SS (BLADE) ×1 IMPLANT
BNDG CMPR 5X6 CHSV STRCH STRL (GAUZE/BANDAGES/DRESSINGS)
BNDG COHESIVE 6X5 TAN ST LF (GAUZE/BANDAGES/DRESSINGS) IMPLANT
CANISTER WOUND CARE 500ML ATS (WOUND CARE) ×1 IMPLANT
COVER SURGICAL LIGHT HANDLE (MISCELLANEOUS) ×1 IMPLANT
CUFF TOURN SGL QUICK 34 (TOURNIQUET CUFF) ×1
CUFF TRNQT CYL 34X4.125X (TOURNIQUET CUFF) ×1 IMPLANT
DRAPE DERMATAC (DRAPES) IMPLANT
DRAPE INCISE IOBAN 66X45 STRL (DRAPES) ×1 IMPLANT
DRAPE U-SHAPE 47X51 STRL (DRAPES) ×1 IMPLANT
DRESSING PREVENA PLUS CUSTOM (GAUZE/BANDAGES/DRESSINGS) ×1 IMPLANT
DRSG PREVENA PLUS CUSTOM (GAUZE/BANDAGES/DRESSINGS) ×1
DURAPREP 26ML APPLICATOR (WOUND CARE) ×1 IMPLANT
ELECT REM PT RETURN 9FT ADLT (ELECTROSURGICAL) ×1
ELECTRODE REM PT RTRN 9FT ADLT (ELECTROSURGICAL) ×1 IMPLANT
GLOVE BIOGEL PI IND STRL 9 (GLOVE) ×1 IMPLANT
GLOVE SURG ORTHO 9.0 STRL STRW (GLOVE) ×1 IMPLANT
GOWN STRL REUS W/ TWL XL LVL3 (GOWN DISPOSABLE) ×2 IMPLANT
GOWN STRL REUS W/TWL XL LVL3 (GOWN DISPOSABLE) ×2
GRAFT SKIN WND MICRO 38 (Tissue) IMPLANT
KIT BASIN OR (CUSTOM PROCEDURE TRAY) ×1 IMPLANT
KIT TURNOVER KIT B (KITS) ×1 IMPLANT
MANIFOLD NEPTUNE II (INSTRUMENTS) ×1 IMPLANT
NS IRRIG 1000ML POUR BTL (IV SOLUTION) ×1 IMPLANT
PACK ORTHO EXTREMITY (CUSTOM PROCEDURE TRAY) ×1 IMPLANT
PAD ARMBOARD 7.5X6 YLW CONV (MISCELLANEOUS) ×1 IMPLANT
PREVENA RESTOR ARTHOFORM 46X30 (CANNISTER) ×1 IMPLANT
SPONGE T-LAP 18X18 ~~LOC~~+RFID (SPONGE) IMPLANT
STAPLER VISISTAT 35W (STAPLE) IMPLANT
STOCKINETTE IMPERVIOUS LG (DRAPES) ×1 IMPLANT
SUT ETHILON 2 0 PSLX (SUTURE) IMPLANT
SUT SILK 2 0 (SUTURE) ×1
SUT SILK 2-0 18XBRD TIE 12 (SUTURE) ×1 IMPLANT
SUT VIC AB 1 CTX 27 (SUTURE) ×2 IMPLANT
TOWEL GREEN STERILE (TOWEL DISPOSABLE) ×1 IMPLANT
TUBE CONNECTING 12X1/4 (SUCTIONS) ×1 IMPLANT
YANKAUER SUCT BULB TIP NO VENT (SUCTIONS) ×1 IMPLANT

## 2023-06-07 NOTE — Anesthesia Procedure Notes (Signed)
Anesthesia Regional Block: Adductor canal block   Pre-Anesthetic Checklist: , timeout performed,  Correct Patient, Correct Site, Correct Laterality,  Correct Procedure, Correct Position, site marked,  Risks and benefits discussed,  Surgical consent,  Pre-op evaluation,  At surgeon's request and post-op pain management  Laterality: Left  Prep: chloraprep       Needles:  Injection technique: Single-shot  Needle Type: Echogenic Needle     Needle Length: 9cm  Needle Gauge: 21     Additional Needles:   Procedures:,,,, ultrasound used (permanent image in chart),,    Narrative:  Start time: 06/07/2023 10:22 AM End time: 06/07/2023 10:27 AM Injection made incrementally with aspirations every 5 mL.  Performed by: Personally  Anesthesiologist: Collene Schlichter, MD  Additional Notes: No pain on injection. No increased resistance to injection. Injection made in 5cc increments.  Good needle visualization.  Patient tolerated procedure well.

## 2023-06-07 NOTE — Interval H&P Note (Signed)
History and Physical Interval Note:  06/07/2023 6:45 AM  William Webb  has presented today for surgery, with the diagnosis of Osteomyelitis Left Foot.  The various methods of treatment have been discussed with the patient and family. After consideration of risks, benefits and other options for treatment, the patient has consented to  Procedure(s): LEFT BELOW KNEE AMPUTATION (Left) as a surgical intervention.  The patient's history has been reviewed, patient examined, no change in status, stable for surgery.  I have reviewed the patient's chart and labs.  Questions were answered to the patient's satisfaction.     [name]

## 2023-06-07 NOTE — Progress Notes (Signed)
**Note De-Identified vi Obfusction** PROGRESS NOTE    [facility]  RUE:[id number] DOB: [birth year] DOA: 05/20/2023 PCP: Irven Coe, MD  Chief Complint  Ptient presents with   Ftigue    Brief Nrrtive:   72 yer old with  history of ESRD on HD MWF, PAF on Eliquis, HTN, HLD, CAD, PAD, nd DM2 who presented to the ER 7/22 with chills nd n episode of loose stool t home. Initil workup to include TTE nd CT scns did not revel  cler source for his fever.   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)   Severe protein-clorie mlnutrition (HCC)   Peripherl rtery disese (HCC)   Proxysml tril fibrilltion (HCC)   Chronic disese nemi   ESRD (end stge renl disese) (HCC)   Internl hemorrhoid   History of CAD (coronry rtery disese)   Pulmonry nodules   Pressure injury of skin   Acute osteomyelitis of left clcneus (HCC)  Gngrene of the left heel  Fever First pprecited 8/3 AM -plin film x-ry suggested possibility of soft tissue necrotizing infection - Poditry ws contcted nd deferred to Orthopedics - ntibiotic coverge brodened - fortuntely MRI did not suggest necrotizing fsciitis - Ortho plnning on left trnstibil mputtion 06/07/2023 Continue vncomycin, zosyn Now s/p left below knee mputtion, ppliction of kerecis micro grft 38 cm, ppliction of preven customizble nd preven rthroform wound vc dressing, ppliction of vive wer stump shrinker nd hnger limb protector  Prior to discovery of L heel gngrene, hd undergone n extensive inptient evlution for fever without  source -left AV fistul helthy without ny signs of infection -negtive COVID x 2 -CXR unreveling -CT bdomen without evidence of cute intr-bdominl process -CT chest reveled some scttered right lung nodules  possibly representing infectious or inflmmtory etiology with fungl workup underwy -blood cultures negtive x 2 -ws dosed with empiric Rocephin for 5 dys without significnt chnge to persistent fever -ID hs been consulted -TTE without evidence of gross endocrditis -no significnt lymphdenopthy noted on CT scnning -plin x-rys of the right hip without cute findings -ultimtely the ptient ws discovered to hve  left heel wound which likely represents the source of his fever   Loose stool C. difficile screen with PCR nd GI pnel both negtive -possibly simply n inflmmtory rection to his illness nd fever -Imodium s needed   Asymptomtic cholelithisis Noted incidentlly on CT bdomen nd pelvis    Incidentl finding of lung nodules Noted during CT bdomen nd pelvis to include solid nodules of the right lower nd right middle lobe - CT chest/bdom/pelvix 7/30 confirmed prt solid 16mm RML nd 11mm RLL nodules fvored to represent infectious/inflmmtory etiology - follow-up noncontrst CT chest is recommended in 3-6 months for  follow-up - Dr. Rito Ehrlich hs lredy mde n mbultory referrl to Pulmonry   ESRD on HD MWF Nephrology following - HD per Nephrology    HTN Controlled t this time    PAF Continue chronic pixbn nd Coreg - rte controlled    History of CAD nd history of PAD Asymptomtic   DM2 Currently on SSI 6 on 02/2023   Peripherl neuropthy Continue usul Neurontin dose   BPH Continue usul Flomx nd Proscr   Mild hypontremi Follow, volume per renl with HD   Situtionl depression The ptient suffered the deth of his brother pproximtely 2 months go nd **Note De-Identified vi Obfusction** fmily reports tht he hs been in  stte of decline since - tril of Remeron hs been initited during this hospitl sty - Chplin consulted     DVT prophylxis: eliquis Code Sttus: full Fmily Communiction: none Disposition:   Sttus is: Inptient Remins inptient  pproprite becuse: continued need for inptient cre   Consultnts:  Orthopedics renl  Procedures:   LEFT BELOW KNEE AMPUTATION Appliction of Kerecis micro grft 38 cm  Appliction of Preven customizble nd Preven rthroform wound VAC dressings Appliction of Vive Wer stump shrinker nd the Hnger limb protector  Antimicrobils:  Anti-infectives (From dmission, onwrd)    Strt     Dose/Rte Route Frequency Ordered Stop   06/07/23 1430  ceFAZolin (ANCEF) IVPB 2g/100 mL premix  Sttus:  Discontinued        2 g 200 mL/hr over 30 Minutes Intrvenous Every 8 hours 06/07/23 1336 06/07/23 1346   06/07/23 0930  ceFAZolin (ANCEF) IVPB 2g/100 mL premix        2 g 200 mL/hr over 30 Minutes Intrvenous On cll to O.R. 06/07/23 0915 06/07/23 1200   06/06/23 1015  vncomycin (VANCOREADY) IVPB 750 mg/150 mL        750 mg 150 mL/hr over 60 Minutes Intrvenous  Once 06/06/23 0921 06/06/23 1215   06/06/23 0923  vncomycin vrible dose per unstble renl function (phrmcist dosing)         Does not pply See dmin instructions 06/06/23 0923     06/04/23 0600  pipercillin-tzobctm (ZOSYN) IVPB 2.25 g        2.25 g 100 mL/hr over 30 Minutes Intrvenous Every 8 hours 06/03/23 2156     06/03/23 1200  vncomycin (VANCOREADY) IVPB 750 mg/150 mL  Sttus:  Discontinued        750 mg 150 mL/hr over 60 Minutes Intrvenous Every M-W-F (Hemodilysis) 06/01/23 1644 06/03/23 0901   06/01/23 1730  pipercillin-tzobctm (ZOSYN) IVPB 2.25 g  Sttus:  Discontinued        2.25 g 100 mL/hr over 30 Minutes Intrvenous Every 8 hours 06/01/23 1633 06/03/23 2156   06/01/23 1730  vncomycin (VANCOREADY) IVPB 1750 mg/350 mL        1,750 mg 175 mL/hr over 120 Minutes Intrvenous  Once 06/01/23 1633 06/01/23 2137   06/01/23 1700  clindmycin (CLEOCIN) IVPB 600 mg  Sttus:  Discontinued        600 mg 100 mL/hr over 30 Minutes Intrvenous Every 8 hours 06/01/23 1602 06/02/23 1306   05/25/23 1500   zithromycin (ZITHROMAX) 500 mg in sodium chloride 0.9 % 250 mL IVPB  Sttus:  Discontinued        500 mg 250 mL/hr over 60 Minutes Intrvenous Every 24 hours 05/25/23 1320 05/26/23 1802   05/21/23 1000  cefTRIAXone (ROCEPHIN) 2 g in sodium chloride 0.9 % 100 mL IVPB  Sttus:  Discontinued        2 g 200 mL/hr over 30 Minutes Intrvenous Every 24 hours 05/21/23 0154 05/26/23 1802   05/21/23 0145  vncomycin (VANCOREADY) IVPB 1750 mg/350 mL        1,750 mg 175 mL/hr over 120 Minutes Intrvenous  Once 05/21/23 0139 05/21/23 0640   05/20/23 2345  cefTRIAXone (ROCEPHIN) 1 g in sodium chloride 0.9 % 100 mL IVPB        1 g 200 mL/hr over 30 Minutes Intrvenous  Once 05/20/23 2334 05/21/23 0030       Subjective: No complints  Objective: Vitls:   06/07/23 1245 06/07/23 1300 06/07/23 1315 06/07/23 1330  BP: (!) 125/57 132/63 134/61 127/63  Pulse: 74 73 72 70  Resp: 16 14 17 16   Temp:   98.2 F (36.8 C) 98.3 F (36.8 C)  TempSrc:    Oral  SpO2: 96% 96% 97% 99%  Weight:      Height:        Intake/Output Summary (Last 24 hours) at 06/07/2023 1706 Last data filed at 06/07/2023 1448 Gross per 24 hour  Intake 700 ml  Output 200 ml  Net 500 ml   Filed Weights   06/05/23 0500 06/05/23 0807 06/07/23 0913  Weight: 75.4 kg 74.9 kg 75 kg    Examination:  General: No acute distress.eating dinner Cardiovascular: RRR Lungs: unlabored Neurological: lert and oriented 3. Moves all extremities 4 with equal strength. Cranial nerves II through XII grossly intact. Extremities: LLE s/p amputation, limb protector in place   Data Reviewed: I have personally reviewed following labs and imaging studies  CBC: Recent Labs  Lab 06/01/23 0047 06/03/23 1126 06/05/23 0820 06/06/23 0750 06/07/23 0840  WBC 16.4* 16.7* 15.4* 17.5* 18.6*  HGB 10.5* 9.7* 9.2* 9.2* 9.6*  HCT 32.5* 29.9* 28.8* 29.0* 29.9*  MCV 94.8 91.4 96.0 93.5 95.8  PLT 247 246 254 248 277    Basic Metabolic Panel: Recent  Labs  Lab 06/03/23 1126 06/05/23 0826 06/06/23 0750 06/07/23 0840  N 130* 131* 134* 132*  K 4.4 3.9 4.1 4.3  CL 92* 93* 93* 94*  CO2 20* 22 26 25   GLUCOSE 177* 199* 185* 144*  BUN 59* 44* 29* 44*  CRETININE 7.56* 6.14* 4.63* 6.22*  CLCIUM 8.3* 8.2* 8.5* 8.6*  MG  --   --  1.7  --   PHOS 7.4* 5.7* 3.8  --     GFR: Estimated Creatinine Clearance: 11.2 mL/min () (by C-G formula based on SCr of 6.22 mg/dL (H)).  Liver Function Tests: Recent Labs  Lab 06/03/23 1126 06/05/23 0826  LBUMIN 1.8* 1.6*    CBG: Recent Labs  Lab 06/07/23 0917 06/07/23 1133 06/07/23 1224 06/07/23 1332 06/07/23 1608  GLUCP 124* 133* 133* 151* 186*     Recent Results (from the past 240 hour(s))  Blastomyces ntigen     Status: None   Collection Time: 05/30/23 12:55 M   Specimen: Blood  Result Value Ref Range Status   Blastomyces ntigen None Detected None Detected ng/mL Final    Comment: (NOTE) Reference Interval: None Detected Reportable Range: 0.31 ng/mL - 20.00 ng/mL Results above 20.00 ng/mL are reported as 'Positive, bove the Limit of Quantification' This test was developed and its performance characteristics determined by [facility]. It has not been cleared or approved by the FD; however, FD clearance or approval is not currently required for clinical use. The results are not intended to be used as the sole means for clinical diagnosis or patient decisions.    Specimen Type SERUM  Final    Comment: (NOTE) Performed t: [facility] [address] [place], [place] [id number] Roxanne Gates MD WU:[id number]   Culture, blood (Routine X 2) w Reflex to ID Panel     Status: None   Collection Time: 06/01/23  4:49 PM   Specimen: BLOOD  Result Value Ref Range Status   Specimen Description BLOOD BLOOD RIGHT HND  Final   Special Requests   Final    BOTTLES DRWN EROBIC ND NEROBIC Blood Culture adequate volume   Culture   Final    NO GROWTH 5 **Note De-Identified vi Obfusction** DAYS Performed t [facility], [address] [place], [place] [address]    Report Sttus 06/06/2023 FINAL  Finl  Culture, blood (Routine X 2) w Reflex to ID Pnel     Sttus: None   Collection Time: 06/01/23  4:51 PM   Specimen: BLOOD  Result Vlue Ref Rnge Sttus   Specimen Description BLOOD BLOOD RIGHT ARM  Finl   Specil Requests   Finl    BOTTLES DRAWN AEROBIC ONLY Blood Culture dequte volume   Culture   Finl    NO GROWTH 5 DAYS Performed t [facility], [address] [place], [place] [address]    Report Sttus 06/06/2023 FINAL  Finl  Surgicl pcr screen     Sttus: None   Collection Time: 06/01/23  9:56 PM   Specimen: Nsl Mucos; Nsl Swb  Result Vlue Ref Rnge Sttus   MRSA, PCR NEGATIVE NEGATIVE Finl   Stphylococcus ureus NEGATIVE NEGATIVE Finl    Comment: (NOTE) The Xpert SA Assy (FDA pproved for NASAL specimens in ptients 26 yers of ge nd older), is one component of  comprehensive surveillnce progrm. It is not intended to dignose infection nor to guide or monitor tretment. Performed t [facility], [address], [place], [place] [address]          Rdiology Studies: No results found.      Scheduled Meds:  (feeding supplement) PROSource Plus  30 mL Orl BID BM   cidophilus  2 cpsule Orl Dily   mLODipine  5 mg Orl Dily   vitmin C  1,000 mg Orl Dily   torvsttin  80 mg Orl Dily   crvedilol  25 mg Orl BID   drbepoetin (ARANESP) injection - DIALYSIS  60 mcg Subcutneous Q Wed-1800   [START ON 06/08/2023] docuste sodium  100 mg Orl Dily   doxerclciferol  4 mcg Intrvenous Q M,W,F-HD   feeding supplement (NEPRO CARB STEADY)  237 mL Orl BID BM   finsteride  5 mg Orl Dily   furosemide  80 mg Orl Once per dy on Sundy Tuesdy Thursdy Sturdy   gbpentin  300 mg Orl BID   insulin sprt  0-6 Units Subcutneous TID WC   isosorbide mononitrte  60 mg Orl Dily   mirtzpine  15  mg Orl QHS   multivitmin  1 tblet Orl QHS   nutrition supplement (JUVEN)  1 pcket Orl BID BM   pntoprzole  40 mg Orl Dily   sevelmer crbonte  1,600 mg Orl TID WC   sodium chloride flush  3 mL Intrvenous Q12H   tmsulosin  0.4 mg Orl QHS   vncomycin vrible dose per unstble renl function (phrmcist dosing)   Does not pply See dmin instructions   zinc sulfte  220 mg Orl Dily   Continuous Infusions:  sodium chloride     sodium chloride Stopped (06/07/23 1546)   mgnesium sulfte bolus IVPB     pipercillin-tzobctm (ZOSYN)  IV 2.25 g (06/07/23 1448)     LOS: 17 dys    Time spent: oer 30 min    Lcreti Nicks, MD [facility]   To contct the ttending provider between 7A-7P or the covering provider during fter hours 7P-7A, plese log into the web site [email or web address] nd ccess using [facility] [place] pssword for tht web site. If you do not hve the pssword, plese cll the hospitl opertor.  06/07/2023, 5:06 PM

## 2023-06-07 NOTE — Progress Notes (Addendum)
[facility] Progress Note   Subjective:    S/p L BKA by Dr. Lajoyce Corners today. Seen and examined patient at bedside. Family members also at bedside. He reports tolerating the procedure well. No acute issues at this time. Next HD 8/10.  Objective Vitals:   06/07/23 1245 06/07/23 1300 06/07/23 1315 06/07/23 1330  BP: (!) 125/57 132/63 134/61 127/63  Pulse: 74 73 72 70  Resp: 16 14 17 16   Temp:   98.2 F (36.8 C) 98.3 F (36.8 C)  TempSrc:    Oral  SpO2: 96% 96% 97% 99%  Weight:      Height:       Physical Exam General: Chronically ill appearing older male in NAD Neuro: Oriented X 3 Heart: S1,S2, RRR SR on monitor.  Lungs: CTAB Abdomen: NABS, flat Extremities: S/p L BKA 8/9, wound vac in place; Trace pedal edema Dialysis Access: L AVF + TB  Filed Weights   06/05/23 0500 06/05/23 0807 06/07/23 0913  Weight: 75.4 kg 74.9 kg 75 kg    Intake/Output Summary (Last 24 hours) at 06/07/2023 1711 Last data filed at 06/07/2023 1448 Gross per 24 hour  Intake 700 ml  Output 200 ml  Net 500 ml    Additional Objective Labs: Basic Metabolic Panel: Recent Labs  Lab 06/03/23 1126 06/05/23 0826 06/06/23 0750 06/07/23 0840  NA 130* 131* 134* 132*  K 4.4 3.9 4.1 4.3  CL 92* 93* 93* 94*  CO2 20* 22 26 25   GLUCOSE 177* 199* 185* 144*  BUN 59* 44* 29* 44*  CREATININE 7.56* 6.14* 4.63* 6.22*  CALCIUM 8.3* 8.2* 8.5* 8.6*  PHOS 7.4* 5.7* 3.8  --    Liver Function Tests: Recent Labs  Lab 06/03/23 1126 06/05/23 0826  ALBUMIN 1.8* 1.6*   No results for input(s): "LIPASE", "AMYLASE" in the last 168 hours. CBC: Recent Labs  Lab 06/01/23 0047 06/03/23 1126 06/05/23 0820 06/06/23 0750 06/07/23 0840  WBC 16.4* 16.7* 15.4* 17.5* 18.6*  HGB 10.5* 9.7* 9.2* 9.2* 9.6*  HCT 32.5* 29.9* 28.8* 29.0* 29.9*  MCV 94.8 91.4 96.0 93.5 95.8  PLT 247 246 254 248 277   Blood Culture    Component Value Date/Time   SDES BLOOD BLOOD RIGHT ARM 06/01/2023 1651   SPECREQUEST   06/01/2023 1651    BOTTLES DRAWN AEROBIC ONLY Blood Culture adequate volume   CULT  06/01/2023 1651    NO GROWTH 5 DAYS Performed at [facility], [address], [place], [place] [address]    REPTSTATUS 06/06/2023 FINAL 06/01/2023 1651    Cardiac Enzymes: No results for input(s): "CKTOTAL", "CKMB", "CKMBINDEX", "TROPONINI" in the last 168 hours. CBG: Recent Labs  Lab 06/07/23 0917 06/07/23 1133 06/07/23 1224 06/07/23 1332 06/07/23 1608  GLUCAP 124* 133* 133* 151* 186*   Iron Studies: No results for input(s): "IRON", "TIBC", "TRANSFERRIN", "FERRITIN" in the last 72 hours. Lab Results  Component Value Date   INR 1.4 (H) 05/20/2023   INR 1.4 (H) 03/21/2023   INR 1.3 (H) 03/12/2022   Studies/Results: No results found.  Medications:  sodium chloride     sodium chloride Stopped (06/07/23 1546)   magnesium sulfate bolus IVPB     piperacillin-tazobactam (ZOSYN)  IV 2.25 g (06/07/23 1448)    (feeding supplement) PROSource Plus  30 mL Oral BID BM   acidophilus  2 capsule Oral Daily   amLODipine  5 mg Oral Daily   vitamin C  1,000 mg Oral Daily   atorvastatin  80  mg Oral Daily   carvedilol  25 mg Oral BID   darbepoetin (ARANESP) injection - DIALYSIS  60 mcg Subcutaneous Q Wed-1800   [START ON 06/08/2023] docusate sodium  100 mg Oral Daily   doxercalciferol  4 mcg Intravenous Q M,W,F-HD   feeding supplement (NEPRO CARB STEADY)  237 mL Oral BID BM   finasteride  5 mg Oral Daily   furosemide  80 mg Oral Once per day on Sunday Tuesday Thursday Saturday   gabapentin  300 mg Oral BID   insulin aspart  0-6 Units Subcutaneous TID WC   isosorbide mononitrate  60 mg Oral Daily   mirtazapine  15 mg Oral QHS   multivitamin  1 tablet Oral QHS   nutrition supplement (JUVEN)  1 packet Oral BID BM   pantoprazole  40 mg Oral Daily   sevelamer carbonate  1,600 mg Oral TID WC   sodium chloride flush  3 mL Intravenous Q12H   tamsulosin  0.4 mg Oral QHS   vancomycin variable dose per  unstable renal function (pharmacist dosing)   Does not apply See admin instructions   zinc sulfate  220 mg Oral Daily    Dialysis Orders: AF MWF 4 hrs 180NRE 425/800 82.5 kg 2.0 K/ 2.0 Ca AVF - Heparin 5000 units IV three times per week - Hectorol 5 mcg IV three times per week  Assessment/Plan: 1. L heel wound: S/p L BKA today per Dr. Lajoyce Corners. ABXs per primary. Next HD will be scheduled for Saturday 06/08/23 to work around recent procedure. 2. ESRD -MWF, off schedule 2nd surgery, see above. Place on MWF schedule next week. 3. Anemia - HGB now 9.6. ESA given on 8/7, monitor trend, titrate up dose if needed. 4. Secondary hyperparathyroidism - Correct calcium on high side. Decrease VDRA. PO4 high, on Renvela 2 tabs with meals.  5. HTN/volume - BP is historically labile. High at admit. Now on Amlodipine 5 mg PO daily and Furosemide 80 mg PO on non HD days. Now under OP EDW by weights, especially after recent BKA, will need to adjust EDW at discharge. UF as tolerated. BP soft/stable now (s/p surgery). Watch trend, may need to wean some of his medications. 6. Nutrition - Low albumin. On protein supplements 7. PAF-continue Eliquis/Carvedilol 8. DMT2- per primary 9. BPH-per primary 10. FFT-Brother died around early 10-Apr-2023. He has declined since his brother's death. Not eating. Liberalize diet.   Salome Holmes, NP [facility] 06/07/2023,5:11 PM  LOS: 17 days

## 2023-06-07 NOTE — Anesthesia Procedure Notes (Signed)
Anesthesia Regional Block: Popliteal block   Pre-Anesthetic Checklist: , timeout performed,  Correct Patient, Correct Site, Correct Laterality,  Correct Procedure, Correct Position, site marked,  Risks and benefits discussed,  Surgical consent,  Pre-op evaluation,  At surgeon's request and post-op pain management  Laterality: Left  Prep: chloraprep       Needles:  Injection technique: Single-shot  Needle Type: Echogenic Needle     Needle Length: 9cm  Needle Gauge: 21     Additional Needles:   Procedures:,,,, ultrasound used (permanent image in chart),,    Narrative:  Start time: 06/07/2023 10:17 AM End time: 06/07/2023 10:22 AM Injection made incrementally with aspirations every 5 mL.  Performed by: Personally  Anesthesiologist: Collene Schlichter, MD  Additional Notes: No pain on injection. No increased resistance to injection. Injection made in 5cc increments.  Good needle visualization.  Patient tolerated procedure well.

## 2023-06-07 NOTE — Op Note (Signed)
06/07/2023  12:21 PM  PATIENT:  William Webb    PRE-OPERATIVE DIAGNOSIS:  Osteomyelitis Left Foot  POST-OPERATIVE DIAGNOSIS:  Same  PROCEDURE:  LEFT BELOW KNEE AMPUTATION Application of Kerecis micro graft 38 cm  Application of Prevena customizable and Prevena arthroform wound VAC dressings Application of Vive Wear stump shrinker and the [employer] limb protector  SURGEON:  [name], MD  ANESTHESIA:   General  PREOPERATIVE INDICATIONS:  William Webb is a  72 y.o. male with a diagnosis of Osteomyelitis Left Foot who failed conservative measures and elected for surgical management.    The risks benefits and alternatives were discussed with the patient preoperatively including but not limited to the risks of infection, bleeding, nerve injury, cardiopulmonary complications, the need for revision surgery, among others, and the patient was willing to proceed.  OPERATIVE IMPLANTS:   Implant Name Type Inv. Item Serial No. Manufacturer Lot No. LRB No. Used Action  GRAFT SKIN WND MICRO 38 - [id number] Tissue GRAFT SKIN WND MICRO 38  [employer] [id number] Left 1 Implanted     OPERATIVE FINDINGS: ischemic muscles  OPERATIVE PROCEDURE: Patient was brought to the operating room after undergoing a regional anesthetic.  After adequate levels anesthesia were obtained a thigh tourniquet was placed and the lower extremity was prepped using DuraPrep draped into a sterile field. The foot was draped out of the sterile field with impervious stockinette.  A timeout was called and the tourniquet inflated.  A transverse skin incision was made 12 cm distal to the tibial tubercle, the incision curved proximally, and a large posterior flap was created.  The tibia was transected just proximal to the skin incision and beveled anteriorly.  The fibula was transected just proximal to the tibial incision.  The sciatic nerve was pulled cut and allowed to retract.  The vascular bundles were suture  ligated with 2-0 silk.  The tourniquet was deflated and hemostasis obtained.     The Kerecis micro powder 38 cm was applied to the open wound that has a 200 cm surface area.   The deep and superficial fascial layers were closed using #1 Vicryl.  The skin was closed using staples.    The Prevena customizable dressing was applied this was overwrapped with the arthroform sponge.  William Webb was used to secure the sponges and the circumferential compression was secured to the skin with Dermatac.  This was connected to the wound VAC pump and had a good suction fit this was covered with a stump shrinker and a limb protector.  Patient was taken to the PACU in stable condition.   DISCHARGE PLANNING:  Antibiotic duration: 24-hour antibiotics  Weightbearing: Nonweightbearing on the operative extremity  Pain medication: Opioid pathway  Dressing care/ Wound VAC: Continue wound VAC with the Prevena plus pump at discharge for 1 week  Ambulatory devices: Walker or kneeling scooter  Discharge to: Discharge planning based on recommendations per physical therapy  Follow-up: In the office 1 week after discharge.

## 2023-06-07 NOTE — Plan of Care (Signed)
  Problem: Health Behavior/Discharge Planning: Goal: Ability to manage health-related needs will improve Outcome: Progressing   

## 2023-06-07 NOTE — Transfer of Care (Signed)
Immediate Anesthesia Transfer of Care Note  Patient: Kenay Conn [facility]  Procedure(s) Performed: LEFT BELOW KNEE AMPUTATION (Left: Knee)  Patient Location: PACU  Anesthesia Type:MAC and Regional  Level of Consciousness: responds to stimulation  Airway & Oxygen Therapy: Patient Spontanous Breathing  Post-op Assessment: Report given to RN and Post -op Vital signs reviewed and stable  Post vital signs: Reviewed and stable  Last Vitals:  Vitals Value Taken Time  BP 104/52 06/07/23 1221  Temp    Pulse 73 06/07/23 1222  Resp 17 06/07/23 1222  SpO2 97 % 06/07/23 1222  Vitals shown include unfiled device data.  Last Pain:  Vitals:   06/07/23 1025  TempSrc:   PainSc: 0-No pain      Patients Stated Pain Goal: 2 (06/01/23 2121)  Complications: No notable events documented.

## 2023-06-07 NOTE — Anesthesia Preprocedure Evaluation (Signed)
Anesthesia Evaluation  Patient identified by MRN, date of birth, ID band Patient awake    Reviewed: Allergy & Precautions, NPO status , Patient's Chart, lab work & pertinent test results  Airway Mallampati: II  TM Distance: >3 FB Neck ROM: Full    Dental  (+) Teeth Intact, Dental Advisory Given   Pulmonary shortness of breath, former smoker   Pulmonary exam normal breath sounds clear to auscultation       Cardiovascular hypertension, Pt. on medications + CAD, + Past MI, + Peripheral Vascular Disease and +CHF  Normal cardiovascular exam Rhythm:Regular Rate:Normal     Neuro/Psych  Neuromuscular disease    GI/Hepatic Neg liver ROS,GERD  ,,  Endo/Other  negative endocrine ROSdiabetes    Renal/GU ESRF and DialysisRenal disease     Musculoskeletal  (+) Arthritis ,  Fibromyalgia -Osteomyelitis Left Foot   Abdominal   Peds  Hematology  (+) Blood dyscrasia, anemia   Anesthesia Other Findings Day of surgery medications reviewed with the patient.  Reproductive/Obstetrics                              Anesthesia Physical Anesthesia Plan  ASA: 3  Anesthesia Plan: Regional   Post-op Pain Management: Tylenol PO (pre-op)*   Induction: Intravenous  PONV Risk Score and Plan: 1 and TIVA, Treatment may vary due to age or medical condition, Dexamethasone and Ondansetron  Airway Management Planned: Natural Airway and Simple Face Mask  Additional Equipment:   Intra-op Plan:   Post-operative Plan:   Informed Consent: I have reviewed the patients History and Physical, chart, labs and discussed the procedure including the risks, benefits and alternatives for the proposed anesthesia with the patient or authorized representative who has indicated his/her understanding and acceptance.     Dental advisory given  Plan Discussed with: CRNA  Anesthesia Plan Comments:          Anesthesia Quick  Evaluation

## 2023-06-08 DIAGNOSIS — B999 Unspecified infectious disease: Secondary | ICD-10-CM | POA: Diagnosis not present

## 2023-06-08 LAB — GLUCOSE, CAPILLARY
Glucose-Capillary: 371 mg/dL — ABNORMAL HIGH (ref 70–99)
Glucose-Capillary: 202 mg/dL — ABNORMAL HIGH (ref 70–99)
Glucose-Capillary: 255 mg/dL — ABNORMAL HIGH (ref 70–99)
Glucose-Capillary: 310 mg/dL — ABNORMAL HIGH (ref 70–99)

## 2023-06-08 MED ORDER — HEPARIN SODIUM (PORCINE) 1000 UNIT/ML DIALYSIS: 1000.0000 [IU] | INTRAMUSCULAR | Status: DC | PRN

## 2023-06-08 MED ORDER — ALTEPLASE 2 MG IJ SOLR: 2.0000 mg | Freq: Once | INTRAMUSCULAR | Status: DC | PRN

## 2023-06-08 MED ORDER — LIDOCAINE-PRILOCAINE 2.5-2.5 % EX CREA: 1.0000 | TOPICAL_CREAM | CUTANEOUS | Status: DC | PRN

## 2023-06-08 MED ORDER — INSULIN ASPART 100 UNIT/ML IJ SOLN
0.0000 [IU] | Freq: Every day | INTRAMUSCULAR | Status: DC
  Administered 2023-06-08: 4 [IU] via SUBCUTANEOUS
  Administered 2023-06-10: 3 [IU] via SUBCUTANEOUS

## 2023-06-08 MED ORDER — LIDOCAINE HCL (PF) 1 % IJ SOLN: 5.0000 mL | INTRAMUSCULAR | Status: DC | PRN

## 2023-06-08 MED ORDER — VANCOMYCIN HCL 750 MG/150ML IV SOLN
750.0000 mg | INTRAVENOUS | Status: AC
  Administered 2023-06-08: 750 mg via INTRAVENOUS
  Filled 2023-06-08: qty 150

## 2023-06-08 MED ORDER — APIXABAN 5 MG PO TABS
5.0000 mg | ORAL_TABLET | Freq: Two times a day (BID) | ORAL | Status: DC
  Administered 2023-06-09 – 2023-06-11 (×5): 5 mg via ORAL
  Filled 2023-06-08 (×5): qty 1

## 2023-06-08 MED ORDER — PENTAFLUOROPROP-TETRAFLUOROETH EX AERO: 1.0000 | INHALATION_SPRAY | CUTANEOUS | Status: DC | PRN

## 2023-06-08 NOTE — Progress Notes (Signed)
**Note De-Identified vi Obfusction** PROGRESS NOTE    William Webb  ZOX:[id number] DOB: 1951-04-16 DOA: 05/20/2023 PCP: Irven Coe, MD  Chief Complint  Ptient presents with   Ftigue    Brief Nrrtive:   30 yer old with  history of ESRD on HD MWF, PAF on Eliquis, HTN, HLD, CAD, PAD, nd DM2 who presented to the ER 7/22 with chills nd n episode of loose stool t home. Initil workup to include TTE nd CT scns did not revel  cler source for his fever.   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)   Severe protein-clorie mlnutrition (HCC)   Peripherl rtery disese (HCC)   Proxysml tril fibrilltion (HCC)   Chronic disese nemi   ESRD (end stge renl disese) (HCC)   Internl hemorrhoid   History of CAD (coronry rtery disese)   Pulmonry nodules   Pressure injury of skin   Acute osteomyelitis of left clcneus (HCC)  Gngrene of the left heel  Fever First pprecited 8/3 AM -plin film x-ry suggested possibility of soft tissue necrotizing infection - Poditry ws contcted nd deferred to Orthopedics - ntibiotic coverge brodened - fortuntely MRI did not suggest necrotizing fsciitis  Improving leukocytosis  Continue vncomycin, zosyn (will d/c tmrw AM) Now s/p left below knee mputtion, ppliction of kerecis micro grft 38 cm, ppliction of preven customizble nd preven rthroform wound vc dressing, ppliction of vive wer stump shrinker nd hnger limb protector  Prior to discovery of L heel gngrene, hd undergone n extensive inptient evlution for fever without  source -left AV fistul helthy without ny signs of infection -negtive COVID x 2 -CXR unreveling -CT bdomen without evidence of cute intr-bdominl process -CT chest reveled some scttered right lung nodules possibly  representing infectious or inflmmtory etiology with fungl workup underwy -blood cultures negtive x 2 -ws dosed with empiric Rocephin for 5 dys without significnt chnge to persistent fever -ID hs been consulted -TTE without evidence of gross endocrditis -no significnt lymphdenopthy noted on CT scnning -plin x-rys of the right hip without cute findings -ultimtely the ptient ws discovered to hve  left heel wound which likely represents the source of his fever   Loose stool C. difficile screen with PCR nd GI pnel both negtive -possibly simply n inflmmtory rection to his illness nd fever -Imodium s needed   Asymptomtic cholelithisis Noted incidentlly on CT bdomen nd pelvis    Incidentl finding of lung nodules Noted during CT bdomen nd pelvis to include solid nodules of the right lower nd right middle lobe - CT chest/bdom/pelvix 7/30 confirmed prt solid 16mm RML nd 11mm RLL nodules fvored to represent infectious/inflmmtory etiology - follow-up noncontrst CT chest is recommended in 3-6 months for  follow-up - Dr. Rito Ehrlich hs lredy mde n mbultory referrl to Pulmonry   ESRD on HD MWF Nephrology following - HD per Nephrology    HTN Controlled t this time    PAF Continue chronic pixbn nd Coreg - rte controlled    History of CAD nd history of PAD Asymptomtic   DM2 Currently on SSI 6 on 02/2023   Peripherl neuropthy Continue usul Neurontin dose   BPH Continue usul Flomx nd Proscr   Mild hypontremi Follow, volume per renl with HD   Situtionl depression The ptient suffered the deth of his brother pproximtely 2 months go nd **Note De-Identified vi Obfusction** fmily reports tht he hs been in  stte of decline since - tril of Remeron hs been initited during this hospitl sty - Chplin consulted     DVT prophylxis: eliquis Code Sttus: full Fmily Communiction: none Disposition:   Sttus is: Inptient Remins inptient pproprite  becuse: continued need for inptient cre   Consultnts:  Orthopedics renl  Procedures:   LEFT BELOW KNEE AMPUTATION Appliction of Kerecis micro grft 38 cm  Appliction of Preven customizble nd Preven rthroform wound VAC dressings Appliction of Vive Wer stump shrinker nd the Hnger limb protector  Antimicrobils:  Anti-infectives (From dmission, onwrd)    Strt     Dose/Rte Route Frequency Ordered Stop   06/07/23 1430  ceFAZolin (ANCEF) IVPB 2g/100 mL premix  Sttus:  Discontinued        2 g 200 mL/hr over 30 Minutes Intrvenous Every 8 hours 06/07/23 1336 06/07/23 1346   06/07/23 0930  ceFAZolin (ANCEF) IVPB 2g/100 mL premix        2 g 200 mL/hr over 30 Minutes Intrvenous On cll to O.R. 06/07/23 0915 06/07/23 1200   06/06/23 1015  vncomycin (VANCOREADY) IVPB 750 mg/150 mL        750 mg 150 mL/hr over 60 Minutes Intrvenous  Once 06/06/23 0921 06/06/23 1215   06/06/23 0923  vncomycin vrible dose per unstble renl function (phrmcist dosing)         Does not pply See dmin instructions 06/06/23 0923     06/04/23 0600  pipercillin-tzobctm (ZOSYN) IVPB 2.25 g        2.25 g 100 mL/hr over 30 Minutes Intrvenous Every 8 hours 06/03/23 2156     06/03/23 1200  vncomycin (VANCOREADY) IVPB 750 mg/150 mL  Sttus:  Discontinued        750 mg 150 mL/hr over 60 Minutes Intrvenous Every M-W-F (Hemodilysis) 06/01/23 1644 06/03/23 0901   06/01/23 1730  pipercillin-tzobctm (ZOSYN) IVPB 2.25 g  Sttus:  Discontinued        2.25 g 100 mL/hr over 30 Minutes Intrvenous Every 8 hours 06/01/23 1633 06/03/23 2156   06/01/23 1730  vncomycin (VANCOREADY) IVPB 1750 mg/350 mL        1,750 mg 175 mL/hr over 120 Minutes Intrvenous  Once 06/01/23 1633 06/01/23 2137   06/01/23 1700  clindmycin (CLEOCIN) IVPB 600 mg  Sttus:  Discontinued        600 mg 100 mL/hr over 30 Minutes Intrvenous Every 8 hours 06/01/23 1602 06/02/23 1306   05/25/23 1500  zithromycin  (ZITHROMAX) 500 mg in sodium chloride 0.9 % 250 mL IVPB  Sttus:  Discontinued        500 mg 250 mL/hr over 60 Minutes Intrvenous Every 24 hours 05/25/23 1320 05/26/23 1802   05/21/23 1000  cefTRIAXone (ROCEPHIN) 2 g in sodium chloride 0.9 % 100 mL IVPB  Sttus:  Discontinued        2 g 200 mL/hr over 30 Minutes Intrvenous Every 24 hours 05/21/23 0154 05/26/23 1802   05/21/23 0145  vncomycin (VANCOREADY) IVPB 1750 mg/350 mL        1,750 mg 175 mL/hr over 120 Minutes Intrvenous  Once 05/21/23 0139 05/21/23 0640   05/20/23 2345  cefTRIAXone (ROCEPHIN) 1 g in sodium chloride 0.9 % 100 mL IVPB        1 g 200 mL/hr over 30 Minutes Intrvenous  Once 05/20/23 2334 05/21/23 0030       Subjective: No complints Tlks bout needing to lern to djust to  amputation Lots of thoughts about this change and challenges related   Objective: Vitals:   06/08/23 0433 06/08/23 0826 06/08/23 0841 06/08/23 0904  BP: 129/61 111/60 118/62 (!) 99/57  Pulse: (!) 59 60 (!) 58 61  Resp: 16 (!) 9 15 14   Temp:  (!) 97.3 F (36.3 C)    TempSrc:      SpO2: 100% 100% 100% 100%  Weight:  71 kg    Height:        Intake/Output Summary (Last 24 hours) at 06/08/2023 0931 Last data filed at 06/08/2023 0631 Gross per 24 hour  Intake 700 ml  Output 200 ml  Net 500 ml   Filed Weights   06/05/23 0807 06/07/23 0913 06/08/23 0826  Weight: 74.9 kg 75 kg 71 kg    Examination:  General: No acute distress. Seen on dialysis  Cardiovascular: RRR Lungs: unlabored Neurological: lert and oriented 3. Moves all extremities 4 with equal strength. Cranial nerves II through XII grossly intact. Extremities: LLE stump protector    Data Reviewed: I have personally reviewed following labs and imaging studies  CBC: Recent Labs  Lab 06/03/23 1126 06/05/23 0820 06/06/23 0750 06/07/23 0840 06/08/23 0129  WBC 16.7* 15.4* 17.5* 18.6* 13.6*  NEUTROBS  --   --   --   --  12.6*  HGB 9.7* 9.2* 9.2* 9.6* 10.0*  HCT  29.9* 28.8* 29.0* 29.9* 30.6*  MCV 91.4 96.0 93.5 95.8 94.7  PLT 246 254 248 277 287    Basic Metabolic Panel: Recent Labs  Lab 06/03/23 1126 06/05/23 0826 06/06/23 0750 06/07/23 0840 06/08/23 0129  N 130* 131* 134* 132* 130*  K 4.4 3.9 4.1 4.3 3.9  CL 92* 93* 93* 94* 93*  CO2 20* 22 26 25  20*  GLUCOSE 177* 199* 185* 144* 371*  BUN 59* 44* 29* 44* 61*  CRETININE 7.56* 6.14* 4.63* 6.22* 7.19*  CLCIUM 8.3* 8.2* 8.5* 8.6* 8.4*  MG  --   --  1.7  --  2.1  PHOS 7.4* 5.7* 3.8  --  7.1*    GFR: Estimated Creatinine Clearance: 9.2 mL/min () (by C-G formula based on SCr of 7.19 mg/dL (H)).  Liver Function Tests: Recent Labs  Lab 06/03/23 1126 06/05/23 0826 06/08/23 0129  ST  --   --  15  LT  --   --  11  LKPHOS  --   --  76  BILITOT  --   --  0.4  PROT  --   --  7.1  LBUMIN 1.8* 1.6* 1.6*    CBG: Recent Labs  Lab 06/07/23 1224 06/07/23 1332 06/07/23 1608 06/07/23 2053 06/08/23 0736  GLUCP 133* 151* 186* 289* 371*     Recent Results (from the past 240 hour(s))  Blastomyces ntigen     Status: None   Collection Time: 05/30/23 12:55 M   Specimen: Blood  Result Value Ref Range Status   Blastomyces ntigen None Detected None Detected ng/mL Final    Comment: (NOTE) Reference Interval: None Detected Reportable Range: 0.31 ng/mL - 20.00 ng/mL Results above 20.00 ng/mL are reported as 'Positive, bove the Limit of Quantification' This test was developed and its performance characteristics determined by [facility]. It has not been cleared or approved by the FD; however, FD clearance or approval is not currently required for clinical use. The results are not intended to be used as the sole means for clinical diagnosis or patient decisions.    Specimen Type SERUM  Final **Note De-Identified vi Obfusction** Comment: (NOTE) Performed At: [facility] [address] [place], [place] [id number] Roxnne Gtes MD WU:[id number]   Culture, blood (Routine X 2) w  Reflex to ID Pnel     Sttus: None   Collection Time: 06/01/23  4:49 PM   Specimen: BLOOD  Result Vlue Ref Rnge Sttus   Specimen Description BLOOD BLOOD RIGHT HAND  Finl   Specil Requests   Finl    BOTTLES DRAWN AEROBIC AND ANAEROBIC Blood Culture dequte volume   Culture   Finl    NO GROWTH 5 DAYS Performed t [facility], [address] [place], [place] [address]    Report Sttus 06/06/2023 FINAL  Finl  Culture, blood (Routine X 2) w Reflex to ID Pnel     Sttus: None   Collection Time: 06/01/23  4:51 PM   Specimen: BLOOD  Result Vlue Ref Rnge Sttus   Specimen Description BLOOD BLOOD RIGHT ARM  Finl   Specil Requests   Finl    BOTTLES DRAWN AEROBIC ONLY Blood Culture dequte volume   Culture   Finl    NO GROWTH 5 DAYS Performed t [facility], [address], [place], [place] [address]    Report Sttus 06/06/2023 FINAL  Finl  Surgicl pcr screen     Sttus: None   Collection Time: 06/01/23  9:56 PM   Specimen: Nsl Mucos; Nsl Swb  Result Vlue Ref Rnge Sttus   MRSA, PCR NEGATIVE NEGATIVE Finl   Stphylococcus ureus NEGATIVE NEGATIVE Finl    Comment: (NOTE) The Xpert SA Assy (FDA pproved for NASAL specimens in ptients 85 yers of ge nd older), is one component of  comprehensive surveillnce progrm. It is not intended to dignose infection nor to guide or monitor tretment. Performed t [facility], [address], [place], [place] [address]          Rdiology Studies: No results found.      Scheduled Meds:  (feeding supplement) PROSource Plus  30 mL Orl BID BM   cidophilus  2 cpsule Orl Dily   mLODipine  5 mg Orl Dily   vitmin C  1,000 mg Orl Dily   torvsttin  80 mg Orl Dily   crvedilol  25 mg Orl BID   Chlorhexidine Gluconte Cloth  6 ech Topicl Q0600   drbepoetin (ARANESP) injection - DIALYSIS  60 mcg Subcutneous Q Wed-1800   docuste sodium  100 mg Orl Dily    doxerclciferol  4 mcg Intrvenous Q M,W,F-HD   feeding supplement (NEPRO CARB STEADY)  237 mL Orl BID BM   finsteride  5 mg Orl Dily   furosemide  80 mg Orl Once per dy on Sundy Tuesdy Thursdy Sturdy   gbpentin  300 mg Orl BID   insulin sprt  0-6 Units Subcutneous TID WC   isosorbide mononitrte  60 mg Orl Dily   mirtzpine  15 mg Orl QHS   multivitmin  1 tblet Orl QHS   nutrition supplement (JUVEN)  1 pcket Orl BID BM   pntoprzole  40 mg Orl Dily   sevelmer crbonte  1,600 mg Orl TID WC   sodium chloride flush  3 mL Intrvenous Q12H   tmsulosin  0.4 mg Orl QHS   vncomycin vrible dose per unstble renl function (phrmcist dosing)   Does not pply See dmin instructions   zinc sulfte  220 mg Orl Dily   Continuous Infusions:  sodium chloride     sodium chloride  Stopped (06/07/23 1546)   magnesium sulfate bolus IVPB     piperacillin-tazobactam (ZOSYN)  IV 2.25 g (06/08/23 0519)     LOS: 18 days    Time spent: oer 30 min    Lacretia Nicks, MD [facility]   To contact the attending provider between 7A-7P or the covering provider during after hours 7P-7A, please log into the web site [email or web address] and access using universal [place] password for that web site. If you do not have the password, please call the hospital operator.  06/08/2023, 9:31 AM

## 2023-06-08 NOTE — Progress Notes (Signed)
Received patient in bed.Awake,alert and oriented x 4.Consent verified.  Medicine given : Hectorol  .                            Vancomycin 750 mg.  Access used: Left upper arm AVF that worked well.  Duration of treatment: 3.5 hours.  Fluid removed: 500 cc.  Hemo comment:Tolerated treatment well.  Hand off to the patient's nurse.

## 2023-06-08 NOTE — Progress Notes (Signed)
Pharmacy Antibiotic Note  William Webb is a 72 y.o. male admitted on 05/20/2023 with  sepsis .  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 Of note, patient is off of HD schedule MWF due to procedure. Last dose of vancomycin given 8/7. Last HD session today then will resume MWF schedule next week. Patient to receive antibiotics for 24 hours after procedure and then will be discontinued.  Plan: Continue Zosyn 2.25g IV q8h -- stop date entered for 8/11 after 0600 dose Give vancomycin 750mg  IV x1 today with HD then no further doses  Height: 6\' 3"  (190.5 cm) Weight: 71 kg (156 lb 8.4 oz) IBW/kg (Calculated) : 84.5  Temp (24hrs), Avg:97.9 F (36.6 C), Min:97.3 F (36.3 C), Max:98.5 F (36.9 C)  Recent Labs  Lab 06/03/23 1126 06/05/23 0820 06/05/23 0826 06/06/23 0750 06/07/23 0840 06/08/23 0129  WBC 16.7* 15.4*  --  17.5* 18.6* 13.6*  CREATININE 7.56*  --  6.14* 4.63* 6.22* 7.19*    Estimated Creatinine Clearance: 9.2 mL/min (A) (by C-G formula based on SCr of 7.19 mg/dL (H)).    No Known Allergies  Antimicrobials this admission: CTX 7/22 >> 7/28 Azithro 7/27 >> 7/28 Clindamycin 8/3 >> 8/4 Vancomycin 8/3 >> 8/5; 8/8 >> 8/10 Zosyn 8/3 >> 8/11  Dose adjustments this admission: None  Microbiology results: 7/22 BCx: neg 7/23 C.Diff: neg 7/23 GI Panel: neg 7/23 MRSA PCR: neg 8/3 BCx: ngtd  Pharmacy will sign off at this time. Thank you for allowing pharmacy to be a part of this patient's care.  Lennie Muckle, PharmD PGY1 Pharmacy Resident 06/08/2023 12:35 PM

## 2023-06-08 NOTE — Progress Notes (Signed)
TR[facility] night cross cover note:   I was notified by RN that the patient's nightly CBG is 310, currently without nightly sliding scale insulin coverage.  I subsequently added nightly sliding scale insulin coverage.    Newton Pigg, DO Hospitalist

## 2023-06-08 NOTE — Evaluation (Addendum)
Physical Therapy Re-Evaluation Patient Details Name: William Webb [facility] MRN: [id number] DOB: 07/10/1951 Today's Date: 06/08/2023  History of Present Illness  Pt is a 72 y.o. 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 PMH includes ESRD (on HD), PAF, HTN, HLD, CAD, PAD, DM2.   Clinical Impression  Pt seen for reevaluation, now s/p L BKA on 06/07/23. Pt limited by generalized weakness, decreased activity tolerance, poor balance strategies and impaired cognition. Today, pt requiring mod-maxA for bed mobility, posterior lean limiting static sitting balance; pt declines attempts at OOB mobility. Initiated post-op amputee education re: precautions, positioning, limb guard wear, residual limb AROM, edema control, importance of mobility. Pt would benefit from post-acute rehab services (<3 hrs/day) to maximize functional mobility and independence prior to return home.       If plan is discharge home, recommend the following: Two people to help with walking and/or transfers;A lot of help with bathing/dressing/bathroom;Assistance with cooking/housework;Assist for transportation;Help with stairs or ramp for entrance   Can travel by private vehicle   No    Equipment Recommendations Defer to next venue; if home, will need at least - Wheelchair (measurements PT);Wheelchair cushion (measurements PT);Hospital bed;Hoyer lift    Recommendations for Other Services       Functional Status Assessment Patient has had a recent decline in their functional status and demonstrates the ability to make significant improvements in function in a reasonable and predictable amount of time.     Precautions / Restrictions Precautions Precautions: Fall;Other (comment) Precaution Comments: bladder/bowel incontinence; L residual limb wound vac Required Braces or Orthoses: Other Brace Other Brace: L BKA limb guard Restrictions Weight Bearing  Restrictions: Yes LLE Weight Bearing: Non weight bearing      Mobility  Bed Mobility Overal bed mobility: Needs Assistance Bed Mobility: Supine to Sit, Sit to Supine     Supine to sit: Mod assist, HOB elevated, Used rails Sit to supine: Mod assist, HOB elevated, Used rails   General bed mobility comments: increased time and effort for bed mobility, requiring intermittent cues for sequencing as pt attempting to power up to long sitting without strength to do so, heavy use of bed rail, modA to scoot hips and elevate trunk, still with posterior lean requiring assist to scoot forward once sitting EOB    Transfers                   General transfer comment: pt declines    Ambulation/Gait                  Stairs            Wheelchair Mobility     Tilt Bed    Modified Rankin (Stroke Patients Only)       Balance Overall balance assessment: Needs assistance Sitting-balance support: Single extremity supported, Bilateral upper extremity supported Sitting balance-Leahy Scale: Poor Sitting balance - Comments: posterior lean reliant on UE support to maintain sitting balance at EOB, intermittent min-maxA in addition; pt with difficulty weightshifting anteriorly despite multimodal cues and attempts to engage core; pt states he thinks it is because he is afraid of falling off bed                                     Pertinent Vitals/Pain Pain Assessment Pain Assessment: Faces Faces Pain Scale: Hurts little more Pain Location: L  residual limb Pain Descriptors / Indicators: Guarding, Grimacing Pain Intervention(s): Monitored during session, Limited activity within patient's tolerance    Home Living Family/patient expects to be discharged to:: Private residence Living Arrangements: Alone Available Help at Discharge: Family;Available PRN/intermittently Type of Home: Apartment Home Access: Level entry       Home Layout: One level         Prior Function Prior Level of Function : Independent/Modified Independent;Driving                     Extremity/Trunk Assessment   Upper Extremity Assessment Upper Extremity Assessment: Generalized weakness    Lower Extremity Assessment Lower Extremity Assessment: Generalized weakness;LLE deficits/detail LLE Deficits / Details: s/p L BKA; gross strength hip flex/abd/add >/ 3/5, demonstrates nearly full knee extension, limited knee flexion secondary to pain       Communication      Cognition Arousal: Alert Behavior During Therapy: Flat affect Overall Cognitive Status: Difficult to assess Area of Impairment: Safety/judgement, Awareness, Problem solving, Following commands                       Following Commands: Follows one step commands with increased time Safety/Judgement: Decreased awareness of safety, Decreased awareness of deficits Awareness: Emergent Problem Solving: Slow processing, Requires verbal cues General Comments: requires encouragement to progress mobility, eventually agreeable to sit EOB with encouragement from family member; aware of need for post-acute rehab and states plan for this        General Comments General comments (skin integrity, edema, etc.): initiated post-op amputation education, including precautions, positioning, limb guard wear, activity recommendations, importance of L residual limb AROM, POC, discharge recommendations. pt's niece present and supportive, retired Lawyer and helpful in motivating pt to participate. pt in agreement with plan for post-acute rehab before home if an option. limb guard doffed for LLE AROM, donned before progressing to EOB mobility    Exercises Amputee Exercises Hip ABduction/ADduction: AROM, Left Hip Flexion/Marching: AROM, Left Knee Flexion:  (limited by pain) Knee Extension: AROM, Left   Assessment/Plan    PT Assessment Patient needs continued PT services  PT Problem List Decreased  strength;Decreased activity tolerance;Decreased balance;Decreased mobility;Decreased knowledge of use of DME;Decreased safety awareness;Decreased knowledge of precautions;Decreased range of motion;Decreased cognition;Pain       PT Treatment Interventions DME instruction;Gait training;Functional mobility training;Therapeutic activities;Therapeutic exercise;Balance training;Neuromuscular re-education;Patient/family education;Wheelchair mobility training;Cognitive remediation    PT Goals (Current goals can be found in the Care Plan section)  Acute Rehab PT Goals Patient Stated Goal: post-acute rehab to regain indep before going home PT Goal Formulation: With patient Time For Goal Achievement: 06/22/23 Potential to Achieve Goals: Fair    Frequency Min 1X/week     Co-evaluation               AM-PAC PT "6 Clicks" Mobility  Outcome Measure Help needed turning from your back to your side while in a flat bed without using bedrails?: A Lot Help needed moving from lying on your back to sitting on the side of a flat bed without using bedrails?: A Lot Help needed moving to and from a bed to a chair (including a wheelchair)?: Total Help needed standing up from a chair using your arms (e.g., wheelchair or bedside chair)?: Total Help needed to walk in hospital room?: Total Help needed climbing 3-5 steps with a railing? : Total 6 Click Score: 8    End of Session   Activity Tolerance: Patient limited by  fatigue;Patient limited by pain (self-limiting) Patient left: in bed;with call bell/phone within reach;with bed alarm set;with family/visitor present Nurse Communication: Mobility status;Other (comment) (bowel incontinence) PT Visit Diagnosis: Other abnormalities of gait and mobility (R26.89);Muscle weakness (generalized) (M62.81);Pain Pain - Right/Left: Left Pain - part of body: Leg    Time: 4010-2725 PT Time Calculation (min) (ACUTE ONLY): 23 min   Charges:   PT Evaluation $PT  Re-evaluation: 1 Re-eval PT Treatments $Therapeutic Activity: 8-22 mins PT General Charges $$ ACUTE PT VISIT: 1 Visit        Ina Homes, PT, DPT Acute Rehabilitation Services  Personal: [facility] Rehab Office: [phone]  Malachy Chamber 06/08/2023, 5:04 PM

## 2023-06-08 NOTE — Progress Notes (Signed)
William Webb Cancellation Note  Patient Details Name: William Webb MRN: [id number] DOB: April 28, 1951   Cancelled Treatment:    Reason Eval/Treat Not Completed: Patient at procedure or test/unavailable (HD). Will follow-up for William Webb Re-evaluation as schedule permits.  William Webb, William Webb, William Webb  Personal: [facility] Rehab Office: [phone]  Malachy Chamber 06/08/2023, 8:28 AM

## 2023-06-08 NOTE — Progress Notes (Signed)
[facility] Progress Note   Subjective:   Pt seen on HD. Reports he is hungry. Denies SOB, CP, dizziness, nausea. Not having any leg pain today.   Objective Vitals:   06/08/23 0200 06/08/23 0433 06/08/23 0826 06/08/23 0841  BP: 107/61 129/61 111/60 118/62  Pulse: 60 (!) 59 60 (!) 58  Resp: 18 16 (!) 9 15  Temp: 98.5 F (36.9 C)  (!) 97.3 F (36.3 C)   TempSrc:      SpO2: 97% 100% 100% 100%  Weight:   71 kg   Height:       Physical Exam General: Alert, elderly male in NAD Heart: RRR, no murmurs, rubs or gallops Lungs: CTA bilaterally, respirations unlabored Abdomen: Soft, non-distended Extremities: L BKA, no edema in RLE Dialysis Access:  LUE AVF Accessed  Additional Objective Labs: Basic Metabolic Panel: Recent Labs  Lab 06/05/23 0826 06/06/23 0750 06/07/23 0840 06/08/23 0129  NA 131* 134* 132* 130*  K 3.9 4.1 4.3 3.9  CL 93* 93* 94* 93*  CO2 22 26 25  20*  GLUCOSE 199* 185* 144* 371*  BUN 44* 29* 44* 61*  CREATININE 6.14* 4.63* 6.22* 7.19*  CALCIUM 8.2* 8.5* 8.6* 8.4*  PHOS 5.7* 3.8  --  7.1*   Liver Function Tests: Recent Labs  Lab 06/03/23 1126 06/05/23 0826 06/08/23 0129  AST  --   --  15  ALT  --   --  11  ALKPHOS  --   --  76  BILITOT  --   --  0.4  PROT  --   --  7.1  ALBUMIN 1.8* 1.6* 1.6*   No results for input(s): "LIPASE", "AMYLASE" in the last 168 hours. CBC: Recent Labs  Lab 06/03/23 1126 06/05/23 0820 06/06/23 0750 06/07/23 0840 06/08/23 0129  WBC 16.7* 15.4* 17.5* 18.6* 13.6*  NEUTROABS  --   --   --   --  12.6*  HGB 9.7* 9.2* 9.2* 9.6* 10.0*  HCT 29.9* 28.8* 29.0* 29.9* 30.6*  MCV 91.4 96.0 93.5 95.8 94.7  PLT 246 254 248 277 287   Blood Culture    Component Value Date/Time   SDES BLOOD BLOOD RIGHT ARM 06/01/2023 1651   SPECREQUEST  06/01/2023 1651    BOTTLES DRAWN AEROBIC ONLY Blood Culture adequate volume   CULT  06/01/2023 1651    NO GROWTH 5 DAYS Performed at [facility], [address], [place], [place] [address]    REPTSTATUS 06/06/2023 FINAL 06/01/2023 1651    Cardiac Enzymes: No results for input(s): "CKTOTAL", "CKMB", "CKMBINDEX", "TROPONINI" in the last 168 hours. CBG: Recent Labs  Lab 06/07/23 1224 06/07/23 1332 06/07/23 1608 06/07/23 2053 06/08/23 0736  GLUCAP 133* 151* 186* 289* 371*   Iron Studies: No results for input(s): "IRON", "TIBC", "TRANSFERRIN", "FERRITIN" in the last 72 hours. @lablastinr3 @ Studies/Results: No results found. Medications:  sodium chloride     sodium chloride Stopped (06/07/23 1546)   magnesium sulfate bolus IVPB     piperacillin-tazobactam (ZOSYN)  IV 2.25 g (06/08/23 0519)    (feeding supplement) PROSource Plus  30 mL Oral BID BM   acidophilus  2 capsule Oral Daily   amLODipine  5 mg Oral Daily   vitamin C  1,000 mg Oral Daily   atorvastatin  80 mg Oral Daily   carvedilol  25 mg Oral BID   Chlorhexidine Gluconate Cloth  6 each Topical Q0600   darbepoetin (ARANESP) injection - DIALYSIS  60 mcg Subcutaneous Q Wed-1800  docusate sodium  100 mg Oral Daily   doxercalciferol  4 mcg Intravenous Q M,W,F-HD   feeding supplement (NEPRO CARB STEADY)  237 mL Oral BID BM   finasteride  5 mg Oral Daily   furosemide  80 mg Oral Once per day on Sunday Tuesday Thursday Saturday   gabapentin  300 mg Oral BID   insulin aspart  0-6 Units Subcutaneous TID WC   isosorbide mononitrate  60 mg Oral Daily   mirtazapine  15 mg Oral QHS   multivitamin  1 tablet Oral QHS   nutrition supplement (JUVEN)  1 packet Oral BID BM   pantoprazole  40 mg Oral Daily   sevelamer carbonate  1,600 mg Oral TID WC   sodium chloride flush  3 mL Intravenous Q12H   tamsulosin  0.4 mg Oral QHS   vancomycin variable dose per unstable renal function (pharmacist dosing)   Does not apply See admin instructions   zinc sulfate  220 mg Oral Daily    Dialysis Orders: AF MWF 4 hrs 180NRE 425/800 82.5 kg 2.0 K/ 2.0 Ca AVF - Heparin 5000 units IV three times per  week - Hectorol 5 mcg IV three times per week  Assessment/Plan:  1. L heel wound: S/p L BKA per Dr. Lajoyce Corners. ABXs per primary.  2. ESRD -MWF, off schedule today due to surgery 8/9, resume MWF schedule next week.  3. Anemia - HGB now 10.0. ESA given on 8/7, monitor trend, titrate up dose if needed. 4. Secondary hyperparathyroidism - Correct calcium on high side. Decreased VDRA. PO4 high, on Renvela 2 tabs with meals.  5. HTN/volume - BP is historically labile. High at admit. Now on Amlodipine 5 mg PO daily and Furosemide 80 mg PO on non HD days. Now under OP EDW by weights, especially after recent BKA, will need to adjust EDW at discharge. UF as tolerated.  6. Nutrition - Low albumin. On protein supplements 7. PAF-on Eliquis/Carvedilol 8. DMT2- per primary 9. FFT-Brother died around early 2023/03/17. He has declined since his brother's death. Not eating. Liberalize diet.   Rogers Blocker, PA-C 06/08/2023, 8:48 AM  [facility] Pager: [phone]

## 2023-06-08 NOTE — Progress Notes (Signed)
Patient ID: Kiandre Andry [facility], male   DOB: 08-Jul-1951, 72 y.o.   MRN: [id number] Patient is postoperative day 1 left transtibial amputation.  There is no drainage of the wound VAC canister.  Anticipate discharge to skilled nursing.

## 2023-06-09 DIAGNOSIS — B999 Unspecified infectious disease: Secondary | ICD-10-CM | POA: Diagnosis not present

## 2023-06-09 LAB — GLUCOSE, CAPILLARY
Glucose-Capillary: 228 mg/dL — ABNORMAL HIGH (ref 70–99)
Glucose-Capillary: 235 mg/dL — ABNORMAL HIGH (ref 70–99)
Glucose-Capillary: 193 mg/dL — ABNORMAL HIGH (ref 70–99)
Glucose-Capillary: 186 mg/dL — ABNORMAL HIGH (ref 70–99)

## 2023-06-09 MED ORDER — INSULIN ASPART 100 UNIT/ML IJ SOLN
0.0000 [IU] | Freq: Three times a day (TID) | INTRAMUSCULAR | Status: DC
  Administered 2023-06-09: 3 [IU] via SUBCUTANEOUS
  Administered 2023-06-09: 2 [IU] via SUBCUTANEOUS
  Administered 2023-06-10 (×2): 3 [IU] via SUBCUTANEOUS
  Administered 2023-06-11: 2 [IU] via SUBCUTANEOUS
  Administered 2023-06-11: 3 [IU] via SUBCUTANEOUS

## 2023-06-09 MED ORDER — CHLORHEXIDINE GLUCONATE CLOTH 2 % EX PADS: 6.0000 | MEDICATED_PAD | Freq: Every day | CUTANEOUS | Status: DC

## 2023-06-09 NOTE — Progress Notes (Signed)
[facility] Progress Note   Subjective:   Pt reports he has been disoriented between days/nights, which is making him anxious. Opened his blinds and oriented him to clock to help. He is currently A&O x3. Denies SOB, CP, dizziness.   Objective Vitals:   06/08/23 1624 06/08/23 2009 06/08/23 2243 06/09/23 0441  BP: (!) 91/52 (!) 95/51 129/60 (!) 145/67  Pulse: 69 65  64  Resp: 18 17  16   Temp: 98 F (36.7 C) 97.7 F (36.5 C)  98.4 F (36.9 C)  TempSrc:      SpO2: 100% 100% 99% 99%  Weight:      Height:       Physical Exam General: Alert, elderly male in NAD Heart: RRR, no murmurs, rubs or gallops Lungs: CTA bilaterally, respirations unlabored Abdomen: Soft, non-distended Extremities: L BKA, no edema in RLE Dialysis Access:  LUE AVF  Additional Objective Labs: Basic Metabolic Panel: Recent Labs  Lab 06/05/23 0826 06/06/23 0750 06/07/23 0840 06/08/23 0129  NA 131* 134* 132* 130*  K 3.9 4.1 4.3 3.9  CL 93* 93* 94* 93*  CO2 22 26 25  20*  GLUCOSE 199* 185* 144* 371*  BUN 44* 29* 44* 61*  CREATININE 6.14* 4.63* 6.22* 7.19*  CALCIUM 8.2* 8.5* 8.6* 8.4*  PHOS 5.7* 3.8  --  7.1*   Liver Function Tests: Recent Labs  Lab 06/03/23 1126 06/05/23 0826 06/08/23 0129  AST  --   --  15  ALT  --   --  11  ALKPHOS  --   --  76  BILITOT  --   --  0.4  PROT  --   --  7.1  ALBUMIN 1.8* 1.6* 1.6*   CBC: Recent Labs  Lab 06/03/23 1126 06/05/23 0820 06/06/23 0750 06/07/23 0840 06/08/23 0129  WBC 16.7* 15.4* 17.5* 18.6* 13.6*  NEUTROABS  --   --   --   --  12.6*  HGB 9.7* 9.2* 9.2* 9.6* 10.0*  HCT 29.9* 28.8* 29.0* 29.9* 30.6*  MCV 91.4 96.0 93.5 95.8 94.7  PLT 246 254 248 277 287   Blood Culture    Component Value Date/Time   SDES BLOOD BLOOD RIGHT ARM 06/01/2023 1651   SPECREQUEST  06/01/2023 1651    BOTTLES DRAWN AEROBIC ONLY Blood Culture adequate volume   CULT  06/01/2023 1651    NO GROWTH 5 DAYS Performed at [facility], [address] [place], [place] [address]    REPTSTATUS 06/06/2023 FINAL 06/01/2023 1651   CBG: Recent Labs  Lab 06/08/23 0736 06/08/23 1308 06/08/23 1623 06/08/23 2134 06/09/23 0728  GLUCAP 371* 202* 255* 310* 228*   Medications:  sodium chloride     sodium chloride Stopped (06/07/23 1546)   magnesium sulfate bolus IVPB      (feeding supplement) PROSource Plus  30 mL Oral BID BM   acidophilus  2 capsule Oral Daily   amLODipine  5 mg Oral Daily   apixaban  5 mg Oral BID   vitamin C  1,000 mg Oral Daily   atorvastatin  80 mg Oral Daily   carvedilol  25 mg Oral BID   Chlorhexidine Gluconate Cloth  6 each Topical Q0600   darbepoetin (ARANESP) injection - DIALYSIS  60 mcg Subcutaneous Q Wed-1800   docusate sodium  100 mg Oral Daily   doxercalciferol  4 mcg Intravenous Q M,W,F-HD   feeding supplement (NEPRO CARB STEADY)  237 mL Oral BID BM   finasteride  5 mg  Oral Daily   furosemide  80 mg Oral Once per day on Sunday Tuesday Thursday Saturday   gabapentin  300 mg Oral BID   insulin aspart  0-5 Units Subcutaneous QHS   insulin aspart  0-6 Units Subcutaneous TID WC   isosorbide mononitrate  60 mg Oral Daily   mirtazapine  15 mg Oral QHS   multivitamin  1 tablet Oral QHS   nutrition supplement (JUVEN)  1 packet Oral BID BM   pantoprazole  40 mg Oral Daily   sevelamer carbonate  1,600 mg Oral TID WC   sodium chloride flush  3 mL Intravenous Q12H   tamsulosin  0.4 mg Oral QHS   zinc sulfate  220 mg Oral Daily    OP Dialysis Orders: AF MWF 4 hrs 180NRE 425/800 82.5 kg 2.0 K/ 2.0 Ca AVF - Heparin 5000 units IV three times per week - Hectorol 5 mcg IV three times per week  Assessment/Plan: 1. L heel wound: S/p L BKA per Dr. Lajoyce Corners. ABXs per primary.  2. ESRD -MWF, off schedule due to surgery 8/9, resume MWF schedule this week.  3. Anemia - HGB now 10.0. ESA given on 8/7, monitor trend, titrate up dose if needed. 4. Secondary hyperparathyroidism - Correct calcium on high side. Decreased  VDRA. PO4 high, on Renvela 2 tabs with meals.  5. HTN/volume - BP is historically labile. High at admit. Now on Amlodipine 5 mg PO daily and Furosemide 80 mg PO on non HD days. Now under OP EDW by weights, especially after recent BKA, will need to adjust EDW at discharge. UF as tolerated.  6. Nutrition - Low albumin. On protein supplements 7. PAF-on Eliquis/Carvedilol 8. DMT2- per primary 9. FFT-Brother died around early 03/20/2023. He has declined since his brother's death. Not eating. Liberalize diet.   Rogers Blocker, PA-C 06/09/2023, 8:54 AM   [facility] Pager: [phone]

## 2023-06-09 NOTE — [place] FL2 (Signed)
[place] MEDICAID FL2 LEVEL OF CARE FORM     IDENTIFICATION  Patient Name: William Webb [facility] Birthdate: August 06, 1951 Sex: male Admission Date (Current Location): 05/20/2023  [facility] and [place] Number:  Producer, television/film/video and Address:  [facility] [place], [address] [place], [place] [address]      Provider Number: [id number]  Attending Physician Name and Address:  Allegra Lai, *  Relative Name and Phone Number:  [facility])  [phone]    Current Level of Care: Hospital Recommended Level of Care: Skilled Nursing Facility Prior Approval Number:    Date Approved/Denied:   PASRR Number: [id number]  Discharge Plan: SNF    Current Diagnoses: Patient Active Problem List   Diagnosis Date Noted   Acute osteomyelitis of left calcaneus (HCC) 06/04/2023   Pressure injury of skin 05/28/2023   FUO (fever of unknown origin) 05/21/2023   Leukocytosis 05/21/2023   History of CAD (coronary artery disease) 05/21/2023   Elevated temperature due to infection 05/21/2023   Pulmonary nodules 05/21/2023   Hemorrhoids 03/22/2023   Internal hemorrhoid 03/22/2023   Bright red blood per rectum 03/21/2023   Prolonged QT interval 03/21/2023   Precordial pain    Critical limb ischemia of right lower extremity (HCC)    Acute osteomyelitis of toe, right (HCC) 03/13/2022   Osteomyelitis (HCC) 03/13/2022   Elevated troponin    SIRS (systemic inflammatory response syndrome) (HCC) 03/12/2022   ESRD (end stage renal disease) (HCC) 10/19/2021   Community acquired pneumonia 02/01/2021   PNA (pneumonia) 02/01/2021   Hypoxia    Pleural effusion on right 12/02/2020   Pericardial effusion 11/02/2020   Loss of consciousness (HCC) 09/16/2020   Syncope and collapse 09/15/2020   CKD (chronic kidney disease), stage IV (HCC) 08/11/2020   Acute kidney injury superimposed on CKD (HCC) 07/31/2020   Hyperlipidemia associated with type 2 diabetes mellitus  (HCC) 07/31/2020   Paroxysmal atrial fibrillation (HCC) 07/31/2020   Chronic disease anemia 07/31/2020   Nausea and vomiting 07/31/2020   Peripheral artery disease (HCC) 03/19/2020   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, limited to breakdown of skin (HCC) 01/08/2020   Status post vascular surgery 12/07/2019   Respiratory failure (HCC) 12/07/2019   Persistent atrial fibrillation (HCC) 07/10/2019   H/O: GI bleed 07/10/2019   CKD (chronic kidney disease) stage 5, GFR less than 15 ml/min (HCC) 06/08/2019   Erectile dysfunction 05/06/2019   Coronary artery disease involving native coronary artery of native heart without angina pectoris 04/05/2019   Acute on chronic respiratory failure with hypoxia (HCC) 12/10/2018   Healthcare-associated pneumonia 12/10/2018   Hemoptysis 12/10/2018   Sepsis (HCC) 12/10/2018   Acute respiratory failure with hypoxia (HCC) 12/10/2018   Severe protein-calorie malnutrition (HCC) 12/02/2018   CAD (coronary artery disease) 12/01/2018   Syncope 12/01/2018   DOE (dyspnea on exertion) 11/18/2018   Hypertension associated with diabetes (HCC)    Hyperlipidemia    Diabetes mellitus with renal complications (HCC)    Noncompliance with medication regimen    Chronic pain syndrome 05/29/2018   Insulin dependent type 2 diabetes mellitus (HCC) 04/07/2018   Stage 3a chronic kidney disease (HCC) 03/11/2018   Fall 11/20/2017   AKI (acute kidney injury) (HCC) 11/20/2017   Normocytic normochromic anemia 11/20/2017   Hypoglycemia 11/19/2017   Essential hypertension 11/19/2017    Orientation RESPIRATION BLADDER Height & Weight     Self, Time, Situation, Place  Normal Continent (anuria) Weight: 155 lb 6.8 oz (70.5 kg)  Height:  6\' 3"  (190.5 cm)  BEHAVIORAL SYMPTOMS/MOOD NEUROLOGICAL BOWEL NUTRITION STATUS      Incontinent Diet (see discharge summary)  AMBULATORY STATUS COMMUNICATION OF NEEDS Skin   Total Care Verbally Surgical wounds                        Personal Care Assistance Level of Assistance  Bathing, Feeding, Dressing Bathing Assistance: Maximum assistance Feeding assistance: Limited assistance Dressing Assistance: Maximum assistance     Functional Limitations Info  Speech, Hearing, Sight Sight Info: Adequate Hearing Info: Adequate Speech Info: Adequate    SPECIAL CARE FACTORS FREQUENCY  PT (By licensed PT), OT (By licensed OT)     PT Frequency: 5x/ week OT Frequency: 5x/ week            Contractures Contractures Info: Not present    Additional Factors Info  Insulin Sliding Scale, Code Status, Allergies Code Status Info: FULL Allergies Info: No Known Allergies   Insulin Sliding Scale Info: see discharge med list       Current Medications (06/09/2023):  This is the current hospital active medication list Current Facility-Administered Medications  Medication Dose Route Frequency Provider Last Rate Last Admin   (feeding supplement) PROSource Plus liquid 30 mL  30 mL Oral BID BM [name], MD   30 mL at 06/07/23 1427   0.9 %  sodium chloride infusion  250 mL Intravenous PRN [name], MD       0.9 %  sodium chloride infusion   Intravenous Continuous [name], MD   Stopped at 06/07/23 1546   acetaminophen (TYLENOL) tablet 325-650 mg  325-650 mg Oral Q6H PRN [name], MD       acidophilus (RISAQUAD) capsule 2 capsule  2 capsule Oral Daily [name], MD   2 capsule at 06/09/23 1213   alum & mag hydroxide-simeth (MAALOX/MYLANTA) 200-200-20 MG/5ML suspension 15-30 mL  15-30 mL Oral Q2H PRN [name], MD       amLODipine (NORVASC) tablet 5 mg  5 mg Oral Daily [name], MD   5 mg at 06/09/23 1215   apixaban (ELIQUIS) tablet 5 mg  5 mg Oral BID Zigmund Daniel., MD   5 mg at 06/09/23 1215   ascorbic acid (VITAMIN C) tablet 1,000 mg  1,000 mg Oral Daily [name], MD   1,000 mg at 06/09/23 1215   atorvastatin (LIPITOR) tablet 80 mg  80 mg Oral Daily [name], MD   80  mg at 06/09/23 1215   bisacodyl (DULCOLAX) EC tablet 5 mg  5 mg Oral Daily PRN [name], MD       carvedilol (COREG) tablet 25 mg  25 mg Oral BID [name], MD   25 mg at 06/09/23 1215   Chlorhexidine Gluconate Cloth 2 % PADS 6 each  6 each Topical Q0600 Berenda Morale, NP   6 each at 06/09/23 0618   Darbepoetin Alfa (ARANESP) injection 60 mcg  60 mcg Subcutaneous Q Wed-1800 [name], MD   60 mcg at 06/05/23 1732   docusate sodium (COLACE) capsule 100 mg  100 mg Oral Daily [name], MD   100 mg at 06/09/23 1215   doxercalciferol (HECTOROL) injection 4 mcg  4 mcg Intravenous Q M,W,F-HD [name], MD   4 mcg at 06/08/23 1103   feeding supplement (NEPRO CARB STEADY) liquid 237 mL  237  mL Oral BID BM [name], MD   237 mL at 06/06/23 1500   finasteride (PROSCAR) tablet 5 mg  5 mg Oral Daily [name], MD   5 mg at 06/09/23 1214   furosemide (LASIX) tablet 80 mg  80 mg Oral Once per day on Sunday Tuesday Thursday Saturday [name], MD   80 mg at 06/09/23 1215   gabapentin (NEURONTIN) capsule 300 mg  300 mg Oral BID [name], MD   300 mg at 06/09/23 1215   guaiFENesin-dextromethorphan (ROBITUSSIN DM) 100-10 MG/5ML syrup 15 mL  15 mL Oral Q4H PRN [name], MD       hydrALAZINE (APRESOLINE) injection 10 mg  10 mg Intravenous Q8H PRN [name], MD   10 mg at 05/30/23 0455   HYDROmorphone (DILAUDID) injection 0.5-1 mg  0.5-1 mg Intravenous Q4H PRN [name], MD       insulin aspart (novoLOG) injection 0-5 Units  0-5 Units Subcutaneous QHS Howerter, Justin B, DO   4 Units at 06/08/23 2244   insulin aspart (novoLOG) injection 0-9 Units  0-9 Units Subcutaneous TID WC Zigmund Daniel., MD   3 Units at 06/09/23 1221   isosorbide mononitrate (IMDUR) 24 hr tablet 60 mg  60 mg Oral Daily [name], MD   60 mg at 06/09/23 1215   labetalol (NORMODYNE) injection 10 mg  10 mg Intravenous Q10 min PRN [name], MD       loperamide  (IMODIUM) capsule 2 mg  2 mg Oral PRN [name], MD   2 mg at 06/05/23 1610   magnesium citrate solution 1 Bottle  1 Bottle Oral Once PRN [name], MD       magnesium sulfate IVPB 2 g 50 mL  2 g Intravenous Daily PRN [name], MD       metoprolol tartrate (LOPRESSOR) injection 2-5 mg  2-5 mg Intravenous Q2H PRN [name], MD       mirtazapine (REMERON SOL-TAB) disintegrating tablet 15 mg  15 mg Oral QHS [name], MD   15 mg at 06/08/23 2241   multivitamin (RENA-VIT) tablet 1 tablet  1 tablet Oral QHS [name], MD   1 tablet at 06/08/23 2241   nutrition supplement (JUVEN) (JUVEN) powder packet 1 packet  1 packet Oral BID BM [name], MD   1 packet at 06/09/23 1214   ondansetron (ZOFRAN) injection 4 mg  4 mg Intravenous Q6H PRN [name], MD       Oral care mouth rinse  15 mL Mouth Rinse PRN [name], MD       oxyCODONE (Oxy IR/ROXICODONE) immediate release tablet 10-15 mg  10-15 mg Oral Q4H PRN [name], MD       oxyCODONE (Oxy IR/ROXICODONE) immediate release tablet 5-10 mg  5-10 mg Oral Q4H PRN [name], MD       pantoprazole (PROTONIX) EC tablet 40 mg  40 mg Oral Daily [name], MD   40 mg at 06/09/23 1215   phenol (CHLORASEPTIC) mouth spray 1 spray  1 spray Mouth/Throat PRN [name], MD       polyethylene glycol (MIRALAX / GLYCOLAX) packet 17 g  17 g Oral Daily PRN [name], MD       potassium chloride SA (KLOR-CON M) CR tablet 20-40 mEq  20-40 mEq Oral Daily PRN Aldean Baker  V, MD       sevelamer carbonate (RENVELA) tablet 1,600 mg  1,600 mg Oral TID WC [name], MD   1,600 mg at 06/09/23 1215   sodium chloride flush (NS) 0.9 % injection 3 mL  3 mL Intravenous Q12H [name], MD   3 mL at 06/08/23 2318   sodium chloride flush (NS) 0.9 % injection 3 mL  3 mL Intravenous PRN [name], MD       tamsulosin [facility]) capsule 0.4 mg  0.4 mg Oral QHS [name], MD   0.4 mg at 06/08/23 2241   zinc  sulfate capsule 220 mg  220 mg Oral Daily [name], MD   220 mg at 06/09/23 1215     Discharge Medications: Please see discharge summary for a list of discharge medications.  Relevant Imaging Results:  Relevant Lab Results:   Additional Information SSN: [id number]; HD: MWF @ [facility], [place] time 10:30- Chair time 10:50  Dellie Burns [place], [place]

## 2023-06-09 NOTE — Progress Notes (Signed)
**Note De-Identified vi Obfusction** PROGRESS NOTE    William Webb [facility]  ZOX:[id number] DOB: Oct 23, 1951 DOA: 05/20/2023 PCP: Irven Coe, MD  Chief Complint  Ptient presents with   Ftigue    Brief Nrrtive:   75 yer old with  history of ESRD on HD MWF, PAF on Eliquis, HTN, HLD, CAD, PAD, nd DM2 who presented to the ER 7/22 with chills nd n episode of loose stool t home. Initil workup to include TTE nd CT scns did not revel  cler source for his fever.   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)   Severe protein-clorie mlnutrition (HCC)   Peripherl rtery disese (HCC)   Proxysml tril fibrilltion (HCC)   Chronic disese nemi   ESRD (end stge renl disese) (HCC)   Internl hemorrhoid   History of CAD (coronry rtery disese)   Pulmonry nodules   Pressure injury of skin   Acute osteomyelitis of left clcneus (HCC)  Gngrene of the left heel  Fever First pprecited 8/3 AM -plin film x-ry suggested possibility of soft tissue necrotizing infection - Poditry ws contcted nd deferred to Orthopedics - ntibiotic coverge brodened - fortuntely MRI did not suggest necrotizing fsciitis  Improving leukocytosis - he refused lbs this m, will follow  Abx d/c'd Now s/p left below knee mputtion, ppliction of kerecis micro grft 38 cm, ppliction of preven customizble nd preven rthroform wound vc dressing, ppliction of vive wer stump shrinker nd hnger limb protector  Prior to discovery of L heel gngrene, hd undergone n extensive inptient evlution for fever without  source -left AV fistul helthy without ny signs of infection -negtive COVID x 2 -CXR unreveling -CT bdomen without evidence of cute intr-bdominl process -CT chest reveled some scttered right lung nodules possibly  representing infectious or inflmmtory etiology with fungl workup underwy -blood cultures negtive x 2 -ws dosed with empiric Rocephin for 5 dys without significnt chnge to persistent fever -ID hs been consulted -TTE without evidence of gross endocrditis -no significnt lymphdenopthy noted on CT scnning -plin x-rys of the right hip without cute findings -ultimtely the ptient ws discovered to hve  left heel wound which likely represents the source of his fever   Loose stool C. difficile screen with PCR nd GI pnel both negtive -possibly simply n inflmmtory rection to his illness nd fever -Imodium s needed   Asymptomtic cholelithisis Noted incidentlly on CT bdomen nd pelvis    Incidentl finding of lung nodules Noted during CT bdomen nd pelvis to include solid nodules of the right lower nd right middle lobe - CT chest/bdom/pelvix 7/30 confirmed prt solid 16mm RML nd 11mm RLL nodules fvored to represent infectious/inflmmtory etiology - follow-up noncontrst CT chest is recommended in 3-6 months for  follow-up - Dr. Rito Ehrlich hs lredy mde n mbultory referrl to Pulmonry   ESRD on HD MWF Nephrology following - HD per Nephrology    HTN Controlled t this time    PAF Resume eliquis tody  Coreg    History of CAD nd history of PAD Asymptomtic   DM2 Currently on SSI 6 on 02/2023   Peripherl neuropthy Continue usul Neurontin dose   BPH Continue usul Flomx nd Proscr   Mild hypontremi Follow, volume per renl with HD   Situtionl depression The ptient suffered the deth of his brother pproximtely 2 months go nd **Note De-Identified vi Obfusction** fmily reports tht he hs been in  stte of decline since - tril of Remeron hs been initited during this hospitl sty - Chplin consulted     DVT prophylxis: eliquis Code Sttus: full Fmily Communiction: none Disposition:   Sttus is: Inptient Remins inptient pproprite becuse: continued need  for inptient cre   Consultnts:  Orthopedics renl  Procedures:   LEFT BELOW KNEE AMPUTATION Appliction of Kerecis micro grft 38 cm  Appliction of Preven customizble nd Preven rthroform wound VAC dressings Appliction of Vive Wer stump shrinker nd the Hnger limb protector  Antimicrobils:  Anti-infectives (From dmission, onwrd)    Strt     Dose/Rte Route Frequency Ordered Stop   06/08/23 1200  vncomycin (VANCOREADY) IVPB 750 mg/150 mL        750 mg 150 mL/hr over 60 Minutes Intrvenous Every St (Hemodilysis) 06/08/23 0942 06/08/23 1207   06/07/23 1430  ceFAZolin (ANCEF) IVPB 2g/100 mL premix  Sttus:  Discontinued        2 g 200 mL/hr over 30 Minutes Intrvenous Every 8 hours 06/07/23 1336 06/07/23 1346   06/07/23 0930  ceFAZolin (ANCEF) IVPB 2g/100 mL premix        2 g 200 mL/hr over 30 Minutes Intrvenous On cll to O.R. 06/07/23 0915 06/07/23 1200   06/06/23 1015  vncomycin (VANCOREADY) IVPB 750 mg/150 mL        750 mg 150 mL/hr over 60 Minutes Intrvenous  Once 06/06/23 0921 06/06/23 1215   06/06/23 0923  vncomycin vrible dose per unstble renl function (phrmcist dosing)  Sttus:  Discontinued         Does not pply See dmin instructions 06/06/23 0923 06/08/23 1228   06/04/23 0600  pipercillin-tzobctm (ZOSYN) IVPB 2.25 g        2.25 g 100 mL/hr over 30 Minutes Intrvenous Every 8 hours 06/03/23 2156 06/09/23 0551   06/03/23 1200  vncomycin (VANCOREADY) IVPB 750 mg/150 mL  Sttus:  Discontinued        750 mg 150 mL/hr over 60 Minutes Intrvenous Every M-W-F (Hemodilysis) 06/01/23 1644 06/03/23 0901   06/01/23 1730  pipercillin-tzobctm (ZOSYN) IVPB 2.25 g  Sttus:  Discontinued        2.25 g 100 mL/hr over 30 Minutes Intrvenous Every 8 hours 06/01/23 1633 06/03/23 2156   06/01/23 1730  vncomycin (VANCOREADY) IVPB 1750 mg/350 mL        1,750 mg 175 mL/hr over 120 Minutes Intrvenous  Once 06/01/23 1633 06/01/23 2137   06/01/23 1700   clindmycin (CLEOCIN) IVPB 600 mg  Sttus:  Discontinued        600 mg 100 mL/hr over 30 Minutes Intrvenous Every 8 hours 06/01/23 1602 06/02/23 1306   05/25/23 1500  zithromycin (ZITHROMAX) 500 mg in sodium chloride 0.9 % 250 mL IVPB  Sttus:  Discontinued        500 mg 250 mL/hr over 60 Minutes Intrvenous Every 24 hours 05/25/23 1320 05/26/23 1802   05/21/23 1000  cefTRIAXone (ROCEPHIN) 2 g in sodium chloride 0.9 % 100 mL IVPB  Sttus:  Discontinued        2 g 200 mL/hr over 30 Minutes Intrvenous Every 24 hours 05/21/23 0154 05/26/23 1802   05/21/23 0145  vncomycin (VANCOREADY) IVPB 1750 mg/350 mL        1,750 mg 175 mL/hr over 120 Minutes Intrvenous  Once 05/21/23 0139 05/21/23 0640   05/20/23 2345  cefTRIAXone (ROCEPHIN) 1 g in sodium chloride 0.9 % 100 mL IVPB 1 g 200 mL/hr over 30 Minutes Intravenous  Once 05/20/23 2334 05/21/23 0030       Subjective: Denies complaints  Objective: Vitals:   06/08/23 2009 06/08/23 2243 06/09/23 0441 06/09/23 0915  BP: (!) 95/51 129/60 (!) 145/67 123/64  Pulse: 65  64 66  Resp: 17  16 18   Temp: 97.7 F (36.5 C)  98.4 F (36.9 C) 98.2 F (36.8 C)  TempSrc:      SpO2: 100% 99% 99% 100%  Weight:      Height:        Intake/Output Summary (Last 24 hours) at 06/09/2023 1346 Last data filed at 06/09/2023 0807 Gross per 24 hour  Intake 600 ml  Output 0 ml  Net 600 ml   Filed Weights   06/07/23 0913 06/08/23 0826 06/08/23 1245  Weight: 75 kg 71 kg 70.5 kg    Examination:  General: No acute distress. Cardiovascular: RRR Lungs: unlabored Neurological: sleeping, awakens appropriately. Moves all extremities 4 with equal strength. Cranial nerves II through XII grossly intact. Extremities: LLE stump protector    Data Reviewed: I have personally reviewed following labs and imaging studies  CBC: Recent Labs  Lab 06/03/23 1126 06/05/23 0820 06/06/23 0750 06/07/23 0840 06/08/23 0129  WBC 16.7* 15.4* 17.5* 18.6* 13.6*   NEUTROBS  --   --   --   --  12.6*  HGB 9.7* 9.2* 9.2* 9.6* 10.0*  HCT 29.9* 28.8* 29.0* 29.9* 30.6*  MCV 91.4 96.0 93.5 95.8 94.7  PLT 246 254 248 277 287    Basic Metabolic Panel: Recent Labs  Lab 06/03/23 1126 06/05/23 0826 06/06/23 0750 06/07/23 0840 06/08/23 0129  N 130* 131* 134* 132* 130*  K 4.4 3.9 4.1 4.3 3.9  CL 92* 93* 93* 94* 93*  CO2 20* 22 26 25  20*  GLUCOSE 177* 199* 185* 144* 371*  BUN 59* 44* 29* 44* 61*  CRETININE 7.56* 6.14* 4.63* 6.22* 7.19*  CLCIUM 8.3* 8.2* 8.5* 8.6* 8.4*  MG  --   --  1.7  --  2.1  PHOS 7.4* 5.7* 3.8  --  7.1*    GFR: Estimated Creatinine Clearance: 9.1 mL/min () (by C-G formula based on SCr of 7.19 mg/dL (H)).  Liver Function Tests: Recent Labs  Lab 06/03/23 1126 06/05/23 0826 06/08/23 0129  ST  --   --  15  LT  --   --  11  LKPHOS  --   --  76  BILITOT  --   --  0.4  PROT  --   --  7.1  LBUMIN 1.8* 1.6* 1.6*    CBG: Recent Labs  Lab 06/08/23 1308 06/08/23 1623 06/08/23 2134 06/09/23 0728 06/09/23 1126  GLUCP 202* 255* 310* 228* 235*     Recent Results (from the past 240 hour(s))  Culture, blood (Routine X 2) w Reflex to ID Panel     Status: None   Collection Time: 06/01/23  4:49 PM   Specimen: BLOOD  Result Value Ref Range Status   Specimen Description BLOOD BLOOD RIGHT HND  Final   Special Requests   Final    BOTTLES DRWN EROBIC ND NEROBIC Blood Culture adequate volume   Culture   Final    NO GROWTH 5 DYS Performed at [facility], [address], [place], [place] [address]    Report Status 06/06/2023 FINL  Final  Culture, blood (Routine X 2) w Reflex to ID Panel     Status: None **Note De-Identified vi Obfusction** Collection Time: 06/01/23  4:51 PM   Specimen: BLOOD  Result Vlue Ref Rnge Sttus   Specimen Description BLOOD BLOOD RIGHT ARM  Finl   Specil Requests   Finl    BOTTLES DRAWN AEROBIC ONLY Blood Culture dequte volume   Culture   Finl    NO GROWTH 5 DAYS Performed t [facility], [address], [place], [place] [address]    Report Sttus 06/06/2023 FINAL  Finl  Surgicl pcr screen     Sttus: None   Collection Time: 06/01/23  9:56 PM   Specimen: Nsl Mucos; Nsl Swb  Result Vlue Ref Rnge Sttus   MRSA, PCR NEGATIVE NEGATIVE Finl   Stphylococcus ureus NEGATIVE NEGATIVE Finl    Comment: (NOTE) The Xpert SA Assy (FDA pproved for NASAL specimens in ptients 85 yers of ge nd older), is one component of  comprehensive surveillnce progrm. It is not intended to dignose infection nor to guide or monitor tretment. Performed t [facility], [address], [place], [place] [address]          Rdiology Studies: No results found.      Scheduled Meds:  (feeding supplement) PROSource Plus  30 mL Orl BID BM   cidophilus  2 cpsule Orl Dily   mLODipine  5 mg Orl Dily   pixbn  5 mg Orl BID   vitmin C  1,000 mg Orl Dily   torvsttin  80 mg Orl Dily   crvedilol  25 mg Orl BID   Chlorhexidine Gluconte Cloth  6 ech Topicl Q0600   drbepoetin (ARANESP) injection - DIALYSIS  60 mcg Subcutneous Q Wed-1800   docuste sodium  100 mg Orl Dily   doxerclciferol  4 mcg Intrvenous Q M,W,F-HD   feeding supplement (NEPRO CARB STEADY)  237 mL Orl BID BM   finsteride  5 mg Orl Dily   furosemide  80 mg Orl Once per dy on Sundy Tuesdy Thursdy Sturdy   gbpentin  300 mg Orl BID   insulin sprt  0-5 Units Subcutneous QHS   insulin sprt  0-9 Units Subcutneous TID WC   isosorbide mononitrte  60 mg Orl Dily   mirtzpine  15 mg Orl QHS   multivitmin  1 tblet Orl QHS   nutrition supplement (JUVEN)  1 pcket Orl BID BM   pntoprzole  40 mg Orl Dily   sevelmer crbonte  1,600 mg Orl TID WC   sodium chloride flush  3 mL Intrvenous Q12H   tmsulosin  0.4 mg Orl QHS   zinc sulfte  220 mg Orl Dily   Continuous Infusions:  sodium chloride     sodium chloride Stopped (06/07/23 1546)    mgnesium sulfte bolus IVPB       LOS: 19 dys    Time spent: oer 30 min    Lcreti Nicks, MD [facility]   To contct the ttending provider between 7A-7P or the covering provider during fter hours 7P-7A, plese log into the web site [email or web address].com nd ccess using [facility] [employer] pssword for tht web site. If you do not hve the pssword, plese cll the hospitl opertor.  06/09/2023, 1:46 PM

## 2023-06-09 NOTE — Progress Notes (Signed)
Patient ID: William Webb [facility], male   DOB: 10-13-1951, 72 y.o.   MRN: [id number] Patient has lost the seal on the wound VAC.  Will discontinue the wound VAC at time of discharge.  I have placed orders.

## 2023-06-10 ENCOUNTER — Encounter (HOSPITAL_COMMUNITY): Payer: Self-pay | Admitting: Orthopedic Surgery

## 2023-06-10 DIAGNOSIS — B999 Unspecified infectious disease: Secondary | ICD-10-CM | POA: Diagnosis not present

## 2023-06-10 LAB — GLUCOSE, CAPILLARY
Glucose-Capillary: 208 mg/dL — ABNORMAL HIGH (ref 70–99)
Glucose-Capillary: 230 mg/dL — ABNORMAL HIGH (ref 70–99)
Glucose-Capillary: 253 mg/dL — ABNORMAL HIGH (ref 70–99)

## 2023-06-10 NOTE — Progress Notes (Addendum)
Received patient in bed.Awake ,alert and oriented x 4.  Medicine given : Hectorol 4 mcg.                            Tyleno 650 mg.  Acces used : Left upper arm AVF that worked well.  Duration of treatment: 3.5 hours.  Fluid removed 1,200 cc.  Hemo comment: Tolerated treatment:                              Patient requested not to give his hospital room number to the man caller " Burge" .Floor nurse made aware.  Hand off to the patient's nurse.

## 2023-06-10 NOTE — Progress Notes (Signed)
**Note De-Identified vi Obfusction** PROGRESS NOTE    [facility] P  GEX:[id number] DOB: 1951/10/29 DOA: 05/20/2023 PCP: Irven Coe, MD  Chief Complint  Ptient presents with   Ftigue    Brief Nrrtive:   61 yer old with  history of ESRD on HD MWF, PAF on Eliquis, HTN, HLD, CAD, PAD, nd DM2 who presented to the ER 7/22 with chills nd n episode of loose stool t home. Initil workup to include TTE nd CT scns did not revel  cler source for his fever.   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)   Severe protein-clorie mlnutrition (HCC)   Peripherl rtery disese (HCC)   Proxysml tril fibrilltion (HCC)   Chronic disese nemi   ESRD (end stge renl disese) (HCC)   Internl hemorrhoid   History of CAD (coronry rtery disese)   Pulmonry nodules   Pressure injury of skin   Acute osteomyelitis of left clcneus (HCC)  Gngrene of the left heel  Fever First pprecited 8/3 AM -plin film x-ry suggested possibility of soft tissue necrotizing infection - Poditry ws contcted nd deferred to Orthopedics - ntibiotic coverge brodened - fortuntely MRI did not suggest necrotizing fsciitis  Improving leukocytosis - he refused lbs this m, will follow  Abx d/c'd - witing for insurnce uth, d/c probbly 8/13 Now s/p left below knee mputtion, ppliction of kerecis micro grft 38 cm, ppliction of preven customizble nd preven rthroform wound vc dressing, ppliction of vive wer stump shrinker nd hnger limb protector  Prior to discovery of L heel gngrene, hd undergone n extensive inptient evlution for fever without  source -left AV fistul helthy without ny signs of infection -negtive COVID x 2 -CXR unreveling -CT bdomen without evidence of cute intr-bdominl process -CT chest reveled  some scttered right lung nodules possibly representing infectious or inflmmtory etiology with fungl workup underwy -blood cultures negtive x 2 -ws dosed with empiric Rocephin for 5 dys without significnt chnge to persistent fever -ID hs been consulted -TTE without evidence of gross endocrditis -no significnt lymphdenopthy noted on CT scnning -plin x-rys of the right hip without cute findings -ultimtely the ptient ws discovered to hve  left heel wound which likely represents the source of his fever   Loose stool C. difficile screen with PCR nd GI pnel both negtive -possibly simply n inflmmtory rection to his illness nd fever -Imodium s needed   Asymptomtic cholelithisis Noted incidentlly on CT bdomen nd pelvis    Incidentl finding of lung nodules Noted during CT bdomen nd pelvis to include solid nodules of the right lower nd right middle lobe - CT chest/bdom/pelvix 7/30 confirmed prt solid 16mm RML nd 11mm RLL nodules fvored to represent infectious/inflmmtory etiology - follow-up noncontrst CT chest is recommended in 3-6 months for  follow-up - Dr. Rito Ehrlich hs lredy mde n mbultory referrl to Pulmonry   ESRD on HD MWF Nephrology following - HD per Nephrology    HTN Controlled t this time    PAF Resume eliquis tody  Coreg    History of CAD nd history of PAD Asymptomtic   DM2 Currently on SSI 6 on 02/2023   Peripherl neuropthy Continue usul Neurontin dose   BPH Continue usul Flomx nd Proscr   Mild hypontremi Follow, volume per renl with HD   Situtionl depression The ptient suffered the deth **Note De-Identified vi Obfusction** of his brother pproximtely 2 months go nd fmily reports tht he hs been in  stte of decline since - tril of Remeron hs been initited during this hospitl sty - Chplin consulted     DVT prophylxis: eliquis Code Sttus: full Fmily Communiction: none Disposition:   Sttus is: Inptient Remins  inptient pproprite becuse: continued need for inptient cre   Consultnts:  Orthopedics renl  Procedures:   LEFT BELOW KNEE AMPUTATION Appliction of Kerecis micro grft 38 cm  Appliction of Preven customizble nd Preven rthroform wound VAC dressings Appliction of Vive Wer stump shrinker nd the Hnger limb protector  Antimicrobils:  Anti-infectives (From dmission, onwrd)    Strt     Dose/Rte Route Frequency Ordered Stop   06/08/23 1200  vncomycin (VANCOREADY) IVPB 750 mg/150 mL        750 mg 150 mL/hr over 60 Minutes Intrvenous Every St (Hemodilysis) 06/08/23 0942 06/08/23 1207   06/07/23 1430  ceFAZolin (ANCEF) IVPB 2g/100 mL premix  Sttus:  Discontinued        2 g 200 mL/hr over 30 Minutes Intrvenous Every 8 hours 06/07/23 1336 06/07/23 1346   06/07/23 0930  ceFAZolin (ANCEF) IVPB 2g/100 mL premix        2 g 200 mL/hr over 30 Minutes Intrvenous On cll to O.R. 06/07/23 0915 06/07/23 1200   06/06/23 1015  vncomycin (VANCOREADY) IVPB 750 mg/150 mL        750 mg 150 mL/hr over 60 Minutes Intrvenous  Once 06/06/23 0921 06/06/23 1215   06/06/23 0923  vncomycin vrible dose per unstble renl function (phrmcist dosing)  Sttus:  Discontinued         Does not pply See dmin instructions 06/06/23 0923 06/08/23 1228   06/04/23 0600  pipercillin-tzobctm (ZOSYN) IVPB 2.25 g        2.25 g 100 mL/hr over 30 Minutes Intrvenous Every 8 hours 06/03/23 2156 06/09/23 0655   06/03/23 1200  vncomycin (VANCOREADY) IVPB 750 mg/150 mL  Sttus:  Discontinued        750 mg 150 mL/hr over 60 Minutes Intrvenous Every M-W-F (Hemodilysis) 06/01/23 1644 06/03/23 0901   06/01/23 1730  pipercillin-tzobctm (ZOSYN) IVPB 2.25 g  Sttus:  Discontinued        2.25 g 100 mL/hr over 30 Minutes Intrvenous Every 8 hours 06/01/23 1633 06/03/23 2156   06/01/23 1730  vncomycin (VANCOREADY) IVPB 1750 mg/350 mL        1,750 mg 175 mL/hr over 120 Minutes Intrvenous  Once  06/01/23 1633 06/01/23 2137   06/01/23 1700  clindmycin (CLEOCIN) IVPB 600 mg  Sttus:  Discontinued        600 mg 100 mL/hr over 30 Minutes Intrvenous Every 8 hours 06/01/23 1602 06/02/23 1306   05/25/23 1500  zithromycin (ZITHROMAX) 500 mg in sodium chloride 0.9 % 250 mL IVPB  Sttus:  Discontinued        500 mg 250 mL/hr over 60 Minutes Intrvenous Every 24 hours 05/25/23 1320 05/26/23 1802   05/21/23 1000  cefTRIAXone (ROCEPHIN) 2 g in sodium chloride 0.9 % 100 mL IVPB  Sttus:  Discontinued        2 g 200 mL/hr over 30 Minutes Intrvenous Every 24 hours 05/21/23 0154 05/26/23 1802   05/21/23 0145  vncomycin (VANCOREADY) IVPB 1750 mg/350 mL        1,750 mg 175 mL/hr over 120 Minutes Intrvenous  Once 05/21/23 0139 05/21/23 0640   05/20/23 2345  cefTRIAXone (ROCEPHIN) 1 g in sodium chloride 0.9 % 100 mL IVPB        1 g 200 mL/hr over 30 Minutes Intravenous  Once 05/20/23 2334 05/21/23 0030       Subjective: Denies complaints  Objective: Vitals:   06/09/23 1642 06/09/23 2034 06/10/23 0613 06/10/23 0911  BP: 115/68 (!) 110/56 (!) 110/56 111/66  Pulse: 70 70 70 75  Resp: 18 17 17 18   Temp: 98.8 F (37.1 C) 97.9 F (36.6 C) 98.4 F (36.9 C) 97.6 F (36.4 C)  TempSrc:  Oral Oral Oral  SpO2:  90% 99% 96%  Weight:      Height:        Intake/Output Summary (Last 24 hours) at 06/10/2023 1033 Last data filed at 06/10/2023 1016 Gross per 24 hour  Intake 358 ml  Output 0 ml  Net 358 ml   Filed Weights   06/07/23 0913 06/08/23 0826 06/08/23 1245  Weight: 75 kg 71 kg 70.5 kg    Examination:  General: No acute distress. Cardiovascular: RRR Lungs: unlabored Neurological: lert and oriented 3. Moves all extremities 4 with equal strength. Cranial nerves II through XII grossly intact. Extremities: LLE stump protector   Data Reviewed: I have personally reviewed following labs and imaging studies  CBC: Recent Labs  Lab 06/05/23 0820 06/06/23 0750 06/07/23 0840  06/08/23 0129 06/10/23 0031  WBC 15.4* 17.5* 18.6* 13.6* 11.2*  NEUTROBS  --   --   --  12.6*  --   HGB 9.2* 9.2* 9.6* 10.0* 10.2*  HCT 28.8* 29.0* 29.9* 30.6* 31.8*  MCV 96.0 93.5 95.8 94.7 92.7  PLT 254 248 277 287 298    Basic Metabolic Panel: Recent Labs  Lab 06/03/23 1126 06/05/23 0826 06/06/23 0750 06/07/23 0840 06/08/23 0129 06/10/23 0031  N 130* 131* 134* 132* 130* 133*  K 4.4 3.9 4.1 4.3 3.9 3.2*  CL 92* 93* 93* 94* 93* 94*  CO2 20* 22 26 25  20* 21*  GLUCOSE 177* 199* 185* 144* 371* 183*  BUN 59* 44* 29* 44* 61* 58*  CRETININE 7.56* 6.14* 4.63* 6.22* 7.19* 5.89*  CLCIUM 8.3* 8.2* 8.5* 8.6* 8.4* 8.2*  MG  --   --  1.7  --  2.1 1.8  PHOS 7.4* 5.7* 3.8  --  7.1* 4.7*    GFR: Estimated Creatinine Clearance: 11.1 mL/min () (by C-G formula based on SCr of 5.89 mg/dL (H)).  Liver Function Tests: Recent Labs  Lab 06/03/23 1126 06/05/23 0826 06/08/23 0129  ST  --   --  15  LT  --   --  11  LKPHOS  --   --  76  BILITOT  --   --  0.4  PROT  --   --  7.1  LBUMIN 1.8* 1.6* 1.6*    CBG: Recent Labs  Lab 06/09/23 0728 06/09/23 1126 06/09/23 1636 06/09/23 2149 06/10/23 0756  GLUCP 228* 235* 193* 186* 208*     Recent Results (from the past 240 hour(s))  Culture, blood (Routine X 2) w Reflex to ID Panel     Status: None   Collection Time: 06/01/23  4:49 PM   Specimen: BLOOD  Result Value Ref Range Status   Specimen Description BLOOD BLOOD RIGHT HND  Final   Special Requests   Final    BOTTLES DRWN EROBIC ND NEROBIC Blood Culture adequate volume   Culture   Final    NO GROWTH 5 DYS Performed at [facility], [address] [place], [place] **Note De-Identified vi Obfusction** [id number]    Report Sttus 06/06/2023 FINAL  Finl  Culture, blood (Routine X 2) w Reflex to ID Pnel     Sttus: None   Collection Time: 06/01/23  4:51 PM   Specimen: BLOOD  Result Vlue Ref Rnge Sttus   Specimen Description BLOOD BLOOD RIGHT ARM  Finl   Specil Requests   Finl     BOTTLES DRAWN AEROBIC ONLY Blood Culture dequte volume   Culture   Finl    NO GROWTH 5 DAYS Performed t [facility], [address], [place], [place] [address]    Report Sttus 06/06/2023 FINAL  Finl  Surgicl pcr screen     Sttus: None   Collection Time: 06/01/23  9:56 PM   Specimen: Nsl Mucos; Nsl Swb  Result Vlue Ref Rnge Sttus   MRSA, PCR NEGATIVE NEGATIVE Finl   Stphylococcus ureus NEGATIVE NEGATIVE Finl    Comment: (NOTE) The Xpert SA Assy (FDA pproved for NASAL specimens in ptients 47 yers of ge nd older), is one component of  comprehensive surveillnce progrm. It is not intended to dignose infection nor to guide or monitor tretment. Performed t [facility], [address], [place], [place] [address]          Rdiology Studies: No results found.      Scheduled Meds:  (feeding supplement) PROSource Plus  30 mL Orl BID BM   cidophilus  2 cpsule Orl Dily   pixbn  5 mg Orl BID   vitmin C  1,000 mg Orl Dily   torvsttin  80 mg Orl Dily   crvedilol  25 mg Orl BID   drbepoetin (ARANESP) injection - DIALYSIS  60 mcg Subcutneous Q Wed-1800   docuste sodium  100 mg Orl Dily   doxerclciferol  4 mcg Intrvenous Q M,W,F-HD   feeding supplement (NEPRO CARB STEADY)  237 mL Orl BID BM   finsteride  5 mg Orl Dily   furosemide  80 mg Orl Once per dy on Sundy Tuesdy Thursdy Sturdy   gbpentin  300 mg Orl BID   insulin sprt  0-5 Units Subcutneous QHS   insulin sprt  0-9 Units Subcutneous TID WC   isosorbide mononitrte  60 mg Orl Dily   mirtzpine  15 mg Orl QHS   multivitmin  1 tblet Orl QHS   nutrition supplement (JUVEN)  1 pcket Orl BID BM   pntoprzole  40 mg Orl Dily   sevelmer crbonte  1,600 mg Orl TID WC   sodium chloride flush  3 mL Intrvenous Q12H   tmsulosin  0.4 mg Orl QHS   zinc sulfte  220 mg Orl Dily   Continuous Infusions:  sodium chloride      sodium chloride Stopped (06/07/23 1546)   mgnesium sulfte bolus IVPB       LOS: 20 dys    Time spent: oer 30 min    Lcreti Nicks, MD [facility]   To contct the ttending provider between 7A-7P or the covering provider during fter hours 7P-7A, plese log into the web site [email or web address] nd ccess using [facility] [place] pssword for tht web site. If you do not hve the pssword, plese cll the hospitl opertor.  06/10/2023, 10:33 AM

## 2023-06-10 NOTE — Progress Notes (Signed)
[facility] Progress Note   Subjective:    Patient just arrived to HD unit today for treatment. UFG set at 1L. He report some left stump soreness.   Objective Vitals:   06/09/23 2034 06/10/23 0613 06/10/23 0911 06/10/23 1533  BP: (!) 110/56 (!) 110/56 111/66 113/60  Pulse: 70 70 75 78  Resp: 17 17 18 16   Temp: 97.9 F (36.6 C) 98.4 F (36.9 C) 97.6 F (36.4 C) 98.1 F (36.7 C)  TempSrc: Oral Oral Oral   SpO2: 90% 99% 96% 98%  Weight:    73 kg  Height:       Physical Exam General: Alert, elderly male in NAD Heart: RRR, no murmurs, rubs or gallops Lungs: CTA bilaterally, respirations unlabored Abdomen: Soft, non-distended Extremities: L BKA, no edema in RLE Dialysis Access:  LUE AVF  Filed Weights   06/08/23 0826 06/08/23 1245 06/10/23 1533  Weight: 71 kg 70.5 kg 73 kg    Intake/Output Summary (Last 24 hours) at 06/10/2023 1553 Last data filed at 06/10/2023 1016 Gross per 24 hour  Intake 238 ml  Output 0 ml  Net 238 ml    Additional Objective Labs: Basic Metabolic Panel: Recent Labs  Lab 06/06/23 0750 06/07/23 0840 06/08/23 0129 06/10/23 0031  NA 134* 132* 130* 133*  K 4.1 4.3 3.9 3.2*  CL 93* 94* 93* 94*  CO2 26 25 20* 21*  GLUCOSE 185* 144* 371* 183*  BUN 29* 44* 61* 58*  CREATININE 4.63* 6.22* 7.19* 5.89*  CALCIUM 8.5* 8.6* 8.4* 8.2*  PHOS 3.8  --  7.1* 4.7*   Liver Function Tests: Recent Labs  Lab 06/05/23 0826 06/08/23 0129  AST  --  15  ALT  --  11  ALKPHOS  --  76  BILITOT  --  0.4  PROT  --  7.1  ALBUMIN 1.6* 1.6*   No results for input(s): "LIPASE", "AMYLASE" in the last 168 hours. CBC: Recent Labs  Lab 06/05/23 0820 06/06/23 0750 06/07/23 0840 06/08/23 0129 06/10/23 0031  WBC 15.4* 17.5* 18.6* 13.6* 11.2*  NEUTROABS  --   --   --  12.6*  --   HGB 9.2* 9.2* 9.6* 10.0* 10.2*  HCT 28.8* 29.0* 29.9* 30.6* 31.8*  MCV 96.0 93.5 95.8 94.7 92.7  PLT 254 248 277 287 298   Blood Culture    Component Value  Date/Time   SDES BLOOD BLOOD RIGHT ARM 06/01/2023 1651   SPECREQUEST  06/01/2023 1651    BOTTLES DRAWN AEROBIC ONLY Blood Culture adequate volume   CULT  06/01/2023 1651    NO GROWTH 5 DAYS Performed at [facility], [address], [place], [place] [address]    REPTSTATUS 06/06/2023 FINAL 06/01/2023 1651    Cardiac Enzymes: No results for input(s): "CKTOTAL", "CKMB", "CKMBINDEX", "TROPONINI" in the last 168 hours. CBG: Recent Labs  Lab 06/09/23 1126 06/09/23 1636 06/09/23 2149 06/10/23 0756 06/10/23 1130  GLUCAP 235* 193* 186* 208* 230*   Iron Studies: No results for input(s): "IRON", "TIBC", "TRANSFERRIN", "FERRITIN" in the last 72 hours. Lab Results  Component Value Date   INR 1.4 (H) 05/20/2023   INR 1.4 (H) 03/21/2023   INR 1.3 (H) 03/12/2022   Studies/Results: No results found.  Medications:  sodium chloride     sodium chloride Stopped (06/07/23 1546)   magnesium sulfate bolus IVPB      (feeding supplement) PROSource Plus  30 mL Oral BID BM   acidophilus  2 capsule Oral Daily  apixaban  5 mg Oral BID   vitamin C  1,000 mg Oral Daily   atorvastatin  80 mg Oral Daily   carvedilol  25 mg Oral BID   darbepoetin (ARANESP) injection - DIALYSIS  60 mcg Subcutaneous Q Wed-1800   docusate sodium  100 mg Oral Daily   doxercalciferol  4 mcg Intravenous Q M,W,F-HD   feeding supplement (NEPRO CARB STEADY)  237 mL Oral BID BM   finasteride  5 mg Oral Daily   furosemide  80 mg Oral Once per day on Sunday Tuesday Thursday Saturday   gabapentin  300 mg Oral BID   insulin aspart  0-5 Units Subcutaneous QHS   insulin aspart  0-9 Units Subcutaneous TID WC   isosorbide mononitrate  60 mg Oral Daily   mirtazapine  15 mg Oral QHS   multivitamin  1 tablet Oral QHS   nutrition supplement (JUVEN)  1 packet Oral BID BM   pantoprazole  40 mg Oral Daily   sevelamer carbonate  1,600 mg Oral TID WC   sodium chloride flush  3 mL Intravenous Q12H   tamsulosin  0.4 mg Oral QHS    zinc sulfate  220 mg Oral Daily    Dialysis Orders: AF MWF 4 hrs 180NRE 425/800 82.5 kg 2.0 K/ 2.0 Ca AVF - Heparin 5000 units IV three times per week - Hectorol 5 mcg IV three times per week  Assessment/Plan: 1. L heel wound: S/p L BKA per Dr. Lajoyce Corners. ABXs per primary.  2. ESRD -MWF, off schedule due to surgery 8/9, resume MWF schedule this week.  3. Anemia - HGB now 10.0. ESA given on 8/7, monitor trend, titrate up dose if needed. 4. Secondary hyperparathyroidism - Correct calcium on high side. Decreased VDRA. PO4 now at goal, continue Renvela 2 tabs with meals.  5. HTN/volume - BP is historically labile. High at admit. Now on Amlodipine 5 mg PO daily and Furosemide 80 mg PO on non HD days. Now under OP EDW by weights, especially after recent BKA, will need to adjust EDW at discharge. UF as tolerated.  6. Nutrition - Low albumin. On protein supplements 7. PAF-on Eliquis/Carvedilol 8. DMT2- per primary 9. FFT-Brother died around early 31-Mar-2023. He has declined since his brother's death. Not eating. Liberalize diet.   Salome Holmes, NP [facility] 06/10/2023,3:53 PM  LOS: 20 days

## 2023-06-10 NOTE — Anesthesia Postprocedure Evaluation (Signed)
Anesthesia Post Note  Patient: William Webb [facility]  Procedure(s) Performed: LEFT BELOW KNEE AMPUTATION (Left: Knee)     Patient location during evaluation: PACU Anesthesia Type: Regional Level of consciousness: awake and alert Pain management: pain level controlled Vital Signs Assessment: post-procedure vital signs reviewed and stable Respiratory status: spontaneous breathing, nonlabored ventilation, respiratory function stable and patient connected to nasal cannula oxygen Cardiovascular status: stable and blood pressure returned to baseline Postop Assessment: no apparent nausea or vomiting Anesthetic complications: no   No notable events documented.  Last Vitals:  Vitals:   06/10/23 1926 06/10/23 1947  BP: (!) 108/59 (!) 123/57  Pulse: 81 79  Resp: 15 16  Temp:  36.8 C  SpO2: 99% 100%    Last Pain:  Vitals:   06/10/23 1947  TempSrc: Oral  PainSc:                  Collene Schlichter

## 2023-06-10 NOTE — TOC Progression Note (Addendum)
Transition of Care [facility]) - Initial/Assessment Note    Patient Details  Name: William Webb MRN: [id number] Date of Birth: 1950/12/20  Transition of Care [facility]) CM/SW Contact:    Ralene Bathe, LCSW Phone Number: 06/10/2023, 10:31 AM  Clinical Narrative:                 LCSW contacted Kia at [facility].  The facility can still accept the patient.  LCSW requested that TOC SWA's being the insurance authorization process.  Addendum 12:30[employer] authorization has been approved.  Plan to discharge to [facility] tomorrow.  TOC following.    Barriers to Discharge: Continued Medical Work up   Patient Goals and CMS Choice Patient states their goals for this hospitalization and ongoing recovery are:: To return home CMS Medicare.gov Compare Post Acute Care list provided to:: Patient Choice offered to / list presented to : Patient      Expected Discharge Plan and Services   Discharge Planning Services: CM Consult   Living arrangements for the past 2 months: Apartment                                      Prior Living Arrangements/Services Living arrangements for the past 2 months: Apartment Lives with:: Self Patient language and need for interpreter reviewed:: Yes Do you feel safe going back to the place where you live?: Yes      Need for Family Participation in Patient Care: Yes (Comment) Care giver support system in place?: Yes (comment) Current home services: DME (Cane, walker, shower seat.) Criminal Activity/Legal Involvement Pertinent to Current Situation/Hospitalization: No - Comment as needed  Activities of Daily Living Home Assistive Devices/Equipment: CBG Meter, Cane (specify quad or straight), Blood pressure cuff ADL Screening (condition at time of admission) Patient's cognitive ability adequate to safely complete daily activities?: Yes Is the patient deaf or have difficulty hearing?: No Does the patient have difficulty seeing,  even when wearing glasses/contacts?: No Does the patient have difficulty concentrating, remembering, or making decisions?: No Patient able to express need for assistance with ADLs?: Yes Does the patient have difficulty dressing or bathing?: No Independently performs ADLs?: Yes (appropriate for developmental age) Does the patient have difficulty walking or climbing stairs?: Yes Weakness of Legs: Both Weakness of Arms/Hands: None  Permission Sought/Granted Permission sought to share information with : Case Manager, Family Supports Permission granted to share information with : Yes, Verbal Permission Granted              Emotional Assessment Appearance:: Appears stated age Attitude/Demeanor/Rapport: Engaged, Gracious Affect (typically observed): Accepting, Appropriate, Calm, Hopeful, Pleasant Orientation: : Oriented to Self, Oriented to Place, Oriented to  Time, Oriented to Situation Alcohol / Substance Use: Not Applicable Psych Involvement: No (comment)  Admission diagnosis:  Elevated temperature due to infection [B99.9] Fever, unspecified fever cause [R50.9] Diarrhea, unspecified type [R19.7] Patient Active Problem List   Diagnosis Date Noted   Acute osteomyelitis of left calcaneus (HCC) 06/04/2023   Pressure injury of skin 05/28/2023   FUO (fever of unknown origin) 05/21/2023   Leukocytosis 05/21/2023   History of CAD (coronary artery disease) 05/21/2023   Elevated temperature due to infection 05/21/2023   Pulmonary nodules 05/21/2023   Hemorrhoids 03/22/2023   Internal hemorrhoid 03/22/2023   Bright red blood per rectum 03/21/2023   Prolonged QT interval 03/21/2023   Precordial pain    Critical limb ischemia  of right lower extremity (HCC)    Acute osteomyelitis of toe, right (HCC) 03/13/2022   Osteomyelitis (HCC) 03/13/2022   Elevated troponin    SIRS (systemic inflammatory response syndrome) (HCC) 03/12/2022   ESRD (end stage renal disease) (HCC) 10/19/2021    Community acquired pneumonia 02/01/2021   PNA (pneumonia) 02/01/2021   Hypoxia    Pleural effusion on right 12/02/2020   Pericardial effusion 11/02/2020   Loss of consciousness (HCC) 09/16/2020   Syncope and collapse 09/15/2020   CKD (chronic kidney disease), stage IV (HCC) 08/11/2020   Acute kidney injury superimposed on CKD (HCC) 07/31/2020   Hyperlipidemia associated with type 2 diabetes mellitus (HCC) 07/31/2020   Paroxysmal atrial fibrillation (HCC) 07/31/2020   Chronic disease anemia 07/31/2020   Nausea and vomiting 07/31/2020   Peripheral artery disease (HCC) 03/19/2020   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, limited to breakdown of skin (HCC) 01/08/2020   Status post vascular surgery 12/07/2019   Respiratory failure (HCC) 12/07/2019   Persistent atrial fibrillation (HCC) 07/10/2019   H/O: GI bleed 07/10/2019   CKD (chronic kidney disease) stage 5, GFR less than 15 ml/min (HCC) 06/08/2019   Erectile dysfunction 05/06/2019   Coronary artery disease involving native coronary artery of native heart without angina pectoris 04/05/2019   Acute on chronic respiratory failure with hypoxia (HCC) 12/10/2018   Healthcare-associated pneumonia 12/10/2018   Hemoptysis 12/10/2018   Sepsis (HCC) 12/10/2018   Acute respiratory failure with hypoxia (HCC) 12/10/2018   Severe protein-calorie malnutrition (HCC) 12/02/2018   CAD (coronary artery disease) 12/01/2018   Syncope 12/01/2018   DOE (dyspnea on exertion) 11/18/2018   Hypertension associated with diabetes (HCC)    Hyperlipidemia    Diabetes mellitus with renal complications (HCC)    Noncompliance with medication regimen    Chronic pain syndrome 05/29/2018   Insulin dependent type 2 diabetes mellitus (HCC) 04/07/2018   Stage 3a chronic kidney disease (HCC) 03/11/2018   Fall 11/20/2017   AKI (acute kidney injury) (HCC) 11/20/2017   Normocytic normochromic anemia 11/20/2017   Hypoglycemia 11/19/2017   Essential hypertension  11/19/2017   PCP:  Irven Coe, MD Pharmacy:   [id number] - [place], [place] - [address] [place] [address] Phone: [phone] Fax: [phone]     Social Determinants of Health (SDOH) Social History: SDOH Screenings   Food Insecurity: No Food Insecurity (05/21/2023)  Housing: Low Risk  (05/21/2023)  Transportation Needs: No Transportation Needs (06/05/2023)  Utilities: Not At Risk (05/21/2023)  Depression (PHQ2-9): Low Risk  (10/07/2020)  Tobacco Use: Medium Risk (06/07/2023)   SDOH Interventions: Transportation Interventions: Intervention Not Indicated, Inpatient TOC, Patient Resources (Friends/Family)   Readmission Risk Interventions    05/22/2023   11:05 AM 03/22/2023    4:41 PM 03/19/2022    3:43 PM  Readmission Risk Prevention Plan  Transportation Screening Complete Complete Complete  Medication Review (RN Care Manager) Referral to Pharmacy Complete Complete  PCP or Specialist appointment within 3-5 days of discharge Complete Complete Complete  HRI or Home Care Consult Complete Complete Complete  SW Recovery Care/Counseling Consult Complete Complete Complete  Palliative Care Screening Not Applicable Not Applicable Not Applicable  Skilled Nursing Facility Not Applicable Not Applicable Not Applicable

## 2023-06-10 NOTE — Progress Notes (Signed)
Advised by CSW today that pt should d/c to snf tomorrow. Contacted [facility] to provide update to clinic and to advise staff to expect pt to resume on Wednesday if pt stable tomorrow. Will assist as needed.   Olivia Canter Renal Navigator [phone]

## 2023-06-10 NOTE — Progress Notes (Signed)
OT Cancellation Note  Patient Details Name: William Webb [facility] MRN: [id number] DOB: 04/24/1951   Cancelled Treatment:    Reason Eval/Treat Not Completed: Patient at procedure or test/ unavailable;Medical issues which prohibited therapy (Attempted to see patient in AM for therapy but he was receiving patient care. OT went to follow-up with patient later in pm and patient was being transported off unit to HD. OT to follow-up with patient as able.)  06/10/2023  AB, OTR/L  Acute Rehabilitation Services  Office: [phone]  William Webb 06/10/2023, 4:38 PM

## 2023-06-10 NOTE — Progress Notes (Signed)
Speech Language Pathology Treatment: Dysphagia  Patient Details Name: William Webb [facility] MRN: [id number] DOB: 03-15-1951 Today's Date: 06/10/2023 Time: 3086-5784 SLP Time Calculation (min) (ACUTE ONLY): 8 min  Assessment / Plan / Recommendation Clinical Impression  Pt seen with breakfast meal, RN provided set up assist, but pt feeding himself. Mastication WNL, no prolonged oral phase, no pocketing with an omelet. Pt can continue diet, staff to provide set up assist and monitor for needs with feeding. No SLP f/u needed acutely. Would benefit from cognitive interventions at SNF level.   HPI HPI: 72 year old with a history of ESRD on HD MWF, PAF on Eliquis, HTN, HLD, CAD, PAD, and DM2 who presented to the ER 7/22 with chills and an episode of loose stool at home.  Left heel wound source of fever. Pt has been orally holding PO. Pt with prior dx of esopahgeal dysmotility and small cervical web.      SLP Plan  All goals met      Recommendations for follow up therapy are one component of a multi-disciplinary discharge planning process, led by the attending physician.  Recommendations may be updated based on patient status, additional functional criteria and insurance authorization.    Recommendations  Diet recommendations: Regular;Thin liquid Liquids provided via: Straw Medication Administration: Whole meds with liquid Supervision: Staff to assist with self feeding (as needed, mostly needs set up assist) Compensations: Follow solids with liquid (moisten food as needed)                              All goals met     , [name]  06/10/2023, 9:45 AM

## 2023-06-10 NOTE — Progress Notes (Signed)
Chaplain responded to [facility] consult for support. Chaplain knocked on patient door and entered the room. Pt was asleep in his hospital bed. Chaplain called Mr Mable name, but he did not rouse. Team will attempt follow up on Tuesday 8/13. Please page if needs arise prior.  Maryanna Shape. Carley Hammed, M.Div. [facility] Chaplain Pager [phone] Office [phone]

## 2023-06-11 ENCOUNTER — Ambulatory Visit: Payer: Medicare Other | Admitting: Neurology

## 2023-06-11 ENCOUNTER — Encounter: Payer: Self-pay | Admitting: Neurology

## 2023-06-11 DIAGNOSIS — I3139 Other pericardial effusion (noninflammatory): Secondary | ICD-10-CM | POA: Diagnosis not present

## 2023-06-11 DIAGNOSIS — I10 Essential (primary) hypertension: Secondary | ICD-10-CM | POA: Diagnosis not present

## 2023-06-11 DIAGNOSIS — M797 Fibromyalgia: Secondary | ICD-10-CM | POA: Diagnosis not present

## 2023-06-11 DIAGNOSIS — L97411 Non-pressure chronic ulcer of right heel and midfoot limited to breakdown of skin: Secondary | ICD-10-CM | POA: Diagnosis not present

## 2023-06-11 DIAGNOSIS — E785 Hyperlipidemia, unspecified: Secondary | ICD-10-CM | POA: Diagnosis not present

## 2023-06-11 DIAGNOSIS — L97509 Non-pressure chronic ulcer of other part of unspecified foot with unspecified severity: Secondary | ICD-10-CM | POA: Diagnosis not present

## 2023-06-11 DIAGNOSIS — B999 Unspecified infectious disease: Secondary | ICD-10-CM | POA: Diagnosis not present

## 2023-06-11 DIAGNOSIS — Z7401 Bed confinement status: Secondary | ICD-10-CM | POA: Diagnosis not present

## 2023-06-11 DIAGNOSIS — E1169 Type 2 diabetes mellitus with other specified complication: Secondary | ICD-10-CM | POA: Diagnosis not present

## 2023-06-11 DIAGNOSIS — I70221 Atherosclerosis of native arteries of extremities with rest pain, right leg: Secondary | ICD-10-CM | POA: Diagnosis not present

## 2023-06-11 DIAGNOSIS — E113512 Type 2 diabetes mellitus with proliferative diabetic retinopathy with macular edema, left eye: Secondary | ICD-10-CM | POA: Diagnosis not present

## 2023-06-11 DIAGNOSIS — N186 End stage renal disease: Secondary | ICD-10-CM | POA: Diagnosis not present

## 2023-06-11 DIAGNOSIS — S81802A Unspecified open wound, left lower leg, initial encounter: Secondary | ICD-10-CM | POA: Diagnosis not present

## 2023-06-11 DIAGNOSIS — R2689 Other abnormalities of gait and mobility: Secondary | ICD-10-CM | POA: Diagnosis not present

## 2023-06-11 DIAGNOSIS — S81802D Unspecified open wound, left lower leg, subsequent encounter: Secondary | ICD-10-CM | POA: Diagnosis not present

## 2023-06-11 DIAGNOSIS — M6281 Muscle weakness (generalized): Secondary | ICD-10-CM | POA: Diagnosis not present

## 2023-06-11 DIAGNOSIS — I120 Hypertensive chronic kidney disease with stage 5 chronic kidney disease or end stage renal disease: Secondary | ICD-10-CM | POA: Diagnosis not present

## 2023-06-11 DIAGNOSIS — D689 Coagulation defect, unspecified: Secondary | ICD-10-CM | POA: Diagnosis not present

## 2023-06-11 DIAGNOSIS — I5032 Chronic diastolic (congestive) heart failure: Secondary | ICD-10-CM | POA: Diagnosis not present

## 2023-06-11 DIAGNOSIS — Z23 Encounter for immunization: Secondary | ICD-10-CM | POA: Diagnosis not present

## 2023-06-11 DIAGNOSIS — M86172 Other acute osteomyelitis, left ankle and foot: Secondary | ICD-10-CM | POA: Diagnosis not present

## 2023-06-11 DIAGNOSIS — Z4781 Encounter for orthopedic aftercare following surgical amputation: Secondary | ICD-10-CM | POA: Diagnosis not present

## 2023-06-11 DIAGNOSIS — D631 Anemia in chronic kidney disease: Secondary | ICD-10-CM | POA: Diagnosis not present

## 2023-06-11 DIAGNOSIS — D649 Anemia, unspecified: Secondary | ICD-10-CM | POA: Diagnosis not present

## 2023-06-11 DIAGNOSIS — R197 Diarrhea, unspecified: Secondary | ICD-10-CM | POA: Diagnosis not present

## 2023-06-11 DIAGNOSIS — D638 Anemia in other chronic diseases classified elsewhere: Secondary | ICD-10-CM | POA: Diagnosis not present

## 2023-06-11 DIAGNOSIS — N2581 Secondary hyperparathyroidism of renal origin: Secondary | ICD-10-CM | POA: Diagnosis not present

## 2023-06-11 DIAGNOSIS — K219 Gastro-esophageal reflux disease without esophagitis: Secondary | ICD-10-CM | POA: Diagnosis not present

## 2023-06-11 DIAGNOSIS — Z79899 Other long term (current) drug therapy: Secondary | ICD-10-CM | POA: Diagnosis not present

## 2023-06-11 DIAGNOSIS — N250 Renal osteodystrophy: Secondary | ICD-10-CM | POA: Diagnosis not present

## 2023-06-11 DIAGNOSIS — M810 Age-related osteoporosis without current pathological fracture: Secondary | ICD-10-CM | POA: Diagnosis not present

## 2023-06-11 DIAGNOSIS — E871 Hypo-osmolality and hyponatremia: Secondary | ICD-10-CM | POA: Diagnosis not present

## 2023-06-11 DIAGNOSIS — C678 Malignant neoplasm of overlapping sites of bladder: Secondary | ICD-10-CM | POA: Diagnosis not present

## 2023-06-11 DIAGNOSIS — E43 Unspecified severe protein-calorie malnutrition: Secondary | ICD-10-CM | POA: Diagnosis not present

## 2023-06-11 DIAGNOSIS — E1129 Type 2 diabetes mellitus with other diabetic kidney complication: Secondary | ICD-10-CM | POA: Diagnosis not present

## 2023-06-11 DIAGNOSIS — I96 Gangrene, not elsewhere classified: Secondary | ICD-10-CM | POA: Diagnosis not present

## 2023-06-11 DIAGNOSIS — E113411 Type 2 diabetes mellitus with severe nonproliferative diabetic retinopathy with macular edema, right eye: Secondary | ICD-10-CM | POA: Diagnosis not present

## 2023-06-11 DIAGNOSIS — I739 Peripheral vascular disease, unspecified: Secondary | ICD-10-CM | POA: Diagnosis not present

## 2023-06-11 DIAGNOSIS — I480 Paroxysmal atrial fibrillation: Secondary | ICD-10-CM | POA: Diagnosis not present

## 2023-06-11 DIAGNOSIS — D509 Iron deficiency anemia, unspecified: Secondary | ICD-10-CM | POA: Diagnosis not present

## 2023-06-11 DIAGNOSIS — L89616 Pressure-induced deep tissue damage of right heel: Secondary | ICD-10-CM | POA: Diagnosis not present

## 2023-06-11 DIAGNOSIS — Z992 Dependence on renal dialysis: Secondary | ICD-10-CM | POA: Diagnosis not present

## 2023-06-11 DIAGNOSIS — I2510 Atherosclerotic heart disease of native coronary artery without angina pectoris: Secondary | ICD-10-CM | POA: Diagnosis not present

## 2023-06-11 DIAGNOSIS — R509 Fever, unspecified: Secondary | ICD-10-CM | POA: Diagnosis not present

## 2023-06-11 DIAGNOSIS — R531 Weakness: Secondary | ICD-10-CM | POA: Diagnosis not present

## 2023-06-11 DIAGNOSIS — E46 Unspecified protein-calorie malnutrition: Secondary | ICD-10-CM | POA: Diagnosis not present

## 2023-06-11 DIAGNOSIS — R110 Nausea: Secondary | ICD-10-CM | POA: Diagnosis not present

## 2023-06-11 DIAGNOSIS — Z89512 Acquired absence of left leg below knee: Secondary | ICD-10-CM | POA: Diagnosis not present

## 2023-06-11 DIAGNOSIS — M159 Polyosteoarthritis, unspecified: Secondary | ICD-10-CM | POA: Diagnosis not present

## 2023-06-11 DIAGNOSIS — I350 Nonrheumatic aortic (valve) stenosis: Secondary | ICD-10-CM | POA: Diagnosis not present

## 2023-06-11 LAB — GLUCOSE, CAPILLARY
Glucose-Capillary: 195 mg/dL — ABNORMAL HIGH (ref 70–99)
Glucose-Capillary: 239 mg/dL — ABNORMAL HIGH (ref 70–99)
Glucose-Capillary: 141 mg/dL — ABNORMAL HIGH (ref 70–99)

## 2023-06-11 MED ORDER — ACETAMINOPHEN 325 MG PO TABS: 325.0000 mg | ORAL_TABLET | Freq: Four times a day (QID) | ORAL | Status: AC | PRN

## 2023-06-11 MED ORDER — POTASSIUM CHLORIDE 20 MEQ PO PACK
40.0000 meq | PACK | Freq: Once | ORAL | Status: AC
  Administered 2023-06-11: 40 meq via ORAL
  Filled 2023-06-11: qty 2

## 2023-06-11 MED ORDER — CARVEDILOL 25 MG PO TABS: 25.0000 mg | ORAL_TABLET | Freq: Two times a day (BID) | ORAL | Status: DC

## 2023-06-11 MED ORDER — ATORVASTATIN CALCIUM 80 MG PO TABS: 80.0000 mg | ORAL_TABLET | Freq: Every day | ORAL | Status: DC

## 2023-06-11 MED ORDER — SEVELAMER CARBONATE 800 MG PO TABS: 1600.0000 mg | ORAL_TABLET | Freq: Three times a day (TID) | ORAL | Status: AC

## 2023-06-11 MED ORDER — MIRTAZAPINE 15 MG PO TBDP: 15.0000 mg | ORAL_TABLET | Freq: Every day | ORAL | Status: DC

## 2023-06-11 NOTE — Evaluation (Signed)
Occupational Therapy RE-evaluation Patient Details Name: William Webb [facility] MRN: [id number] DOB: October 08, 1951 Today's Date: 06/11/2023   History of Present Illness Pt is a 72 y.o. 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 PMH includes ESRD (on HD), PAF, HTN, HLD, CAD, PAD, DM2.   Clinical Impression   Pt admitted for above and is now s/p L BKA. Pt presenting with generalized weakness and poor balance, needing Mod to max A +2 for bed mobility and Max A with multi modal cues to maintain sitting balance EOB.  Pt needs Total A +2 once EOB for lateral scooting and needed max encouragement to engage in dynamic reaching task while sitting EOB. OT to continue to progress pt as able, DC plans remain appropriate for SNF.      If plan is discharge home, recommend the following: Two people to help with walking and/or transfers;Two people to help with bathing/dressing/bathroom;Assistance with cooking/housework;Assistance with feeding;Direct supervision/assist for medications management;Direct supervision/assist for financial management;Assist for transportation;Help with stairs or ramp for entrance    Functional Status Assessment  Patient has had a recent decline in their functional status and demonstrates the ability to make significant improvements in function in a reasonable and predictable amount of time.  Equipment Recommendations  Other (comment) (defer to next level of care)    Recommendations for Other Services       Precautions / Restrictions Precautions Precautions: Fall;Other (comment) Precaution Comments: bladder/bowel incontinence; L residual limb wound vac Required Braces or Orthoses: Other Brace Other Brace: L BKA limb guard Restrictions Weight Bearing Restrictions: Yes LLE Weight Bearing: Non weight bearing Other Position/Activity Restrictions: defer WB on LLE given heel wound, also MD plan for LLE  amputation on 8/9      Mobility Bed Mobility Overal bed mobility: Needs Assistance Bed Mobility: Sit to Supine, Rolling, Sidelying to Sit Rolling: Mod assist, Used rails Sidelying to sit: Mod assist, +2 for physical assistance, HOB elevated, Used rails   Sit to supine: Mod assist, HOB elevated, Used rails   General bed mobility comments: Pt needing assist to position into upright sitting and assist with BLE management upon return to supine. Needs cueing to use bed rails to assist with bed mobility, total A for lateral scooting to [facility] with use of draw pad to acheive more mobility    Transfers                          Balance Overall balance assessment: Needs assistance Sitting-balance support: Single extremity supported Sitting balance-Leahy Scale: Poor Sitting balance - Comments: Strong R lean needing verbal and visual cues to reposition, LUE supported on bed frame. Mod to Max A to assist with maintaining sitting balance Postural control: Right lateral lean                                 ADL either performed or assessed with clinical judgement   ADL Overall ADL's : Needs assistance/impaired Eating/Feeding: Set up;Bed level   Grooming: Oral care;Set up;Bed level;Cueing for sequencing   Upper Body Bathing: Maximal assistance;Bed level   Lower Body Bathing: Total assistance;Bed level;+2 for physical assistance   Upper Body Dressing : Maximal assistance;Bed level   Lower Body Dressing: Total assistance;Bed level;+2 for physical assistance                 General ADL  Comments: OOB mobility deferred at this time, focused session on improving sitting balance through dynamic reaching attempts and verbal/visual cues for upright sitting posture. Pt declined ADLs in sitting     Vision         Perception         Praxis         Pertinent Vitals/Pain Pain Assessment Pain Assessment: Faces Faces Pain Scale: Hurts little more Pain Location: L  residual limb Pain Descriptors / Indicators: Guarding, Grimacing Pain Intervention(s): Monitored during session, Repositioned     Extremity/Trunk Assessment Upper Extremity Assessment Upper Extremity Assessment: Generalized weakness   Lower Extremity Assessment Lower Extremity Assessment: Generalized weakness LLE Deficits / Details: s/p L BKA; gross strength hip flex/abd/add >/ 3/5, demonstrates nearly full knee extension, limited knee flexion secondary to pain   Cervical / Trunk Assessment Cervical / Trunk Assessment: Kyphotic   Communication     Cognition Arousal: Lethargic Behavior During Therapy: Flat affect Overall Cognitive Status: Difficult to assess Area of Impairment: Following commands, Problem solving                       Following Commands: Follows one step commands with increased time     Problem Solving: Slow processing       General Comments  +2 staff assist for placement of pt's limb guard with pt able to follow multimodal instructions given to assist with single strap, however pt with difficulty with fine motor coordination needs assist to effectively adjust the velcro. Pt unable to reach down to lower straps on brace at time of session. Pillow placed under his L elbow to prevent L lean while pt upright in bed to eat    Exercises     Shoulder Instructions      Home Living Family/patient expects to be discharged to:: Private residence Living Arrangements: Alone Available Help at Discharge: Family;Available PRN/intermittently Type of Home: Apartment Home Access: Level entry     Home Layout: One level     Bathroom Shower/Tub: Chief Strategy Officer: Standard     Home Equipment: Agricultural consultant (2 wheels)          Prior Functioning/Environment Prior Level of Function : Independent/Modified Independent;Driving                        OT Problem List: Decreased strength;Decreased activity tolerance;Impaired balance  (sitting and/or standing);Decreased cognition;Decreased knowledge of use of DME or AE;Decreased knowledge of precautions      OT Treatment/Interventions: Self-care/ADL training;Therapeutic exercise;DME and/or AE instruction;Therapeutic activities;Patient/family education;Balance training;Cognitive remediation/compensation    OT Goals(Current goals can be found in the care plan section) Acute Rehab OT Goals Patient Stated Goal: Do good at rehab OT Goal Formulation: With patient Time For Goal Achievement: 06/25/23 Potential to Achieve Goals: Fair  OT Frequency: Min 1X/week    Co-evaluation PT/OT/SLP Co-Evaluation/Treatment: Yes Reason for Co-Treatment: Complexity of the patient's impairments (multi-system involvement);To address functional/ADL transfers PT goals addressed during session: Mobility/safety with mobility;Balance OT goals addressed during session: ADL's and self-care      AM-PAC OT "6 Clicks" Daily Activity     Outcome Measure Help from another person eating meals?: A Little Help from another person taking care of personal grooming?: A Little Help from another person toileting, which includes using toliet, bedpan, or urinal?: Total Help from another person bathing (including washing, rinsing, drying)?: Total Help from another person to put on and taking off regular upper  body clothing?: A Lot Help from another person to put on and taking off regular lower body clothing?: Total 6 Click Score: 11   End of Session Nurse Communication: Mobility status  Activity Tolerance: Patient limited by lethargy Patient left: in bed;with call bell/phone within reach  OT Visit Diagnosis: Muscle weakness (generalized) (M62.81);Other (comment)                Time: 1356-1430 OT Time Calculation (min): 34 min Charges:  OT General Charges $OT Visit: 1 Visit OT Evaluation $OT Re-eval: 1 Re-eval  06/11/2023  AB, OTR/L  Acute Rehabilitation Services  Office: [phone]   Tristan Schroeder 06/11/2023, 3:19 PM

## 2023-06-11 NOTE — Plan of Care (Signed)
[facility] Patient Discharge Orders- [facility] CLINIC: [facility]-DC to SNF  Patient's name: [name] Admit/DC Dates: 05/20/2023 - 06/11/2023  Discharge Diagnoses: Left heal wound, s/p L BKA by Dr. Lajoyce Corners, has scheduled f/u with Dr. Lajoyce Corners. MRI did NOT suggest necrotizing fascitis. Incidental finding of lund nodules- Noted during CT abdomen and pelvis to include solid nodules of the right lower and right middle lobe - CT chest/abdom/pelvix 7/30 confirmed part solid 16mm RML and 11mm RLL nodules favored to represent infectious/inflammatory etiology - follow-up noncontrast CT chest is recommended in 3-6 months for  follow-up - Dr. Rito Ehrlich has already made an ambulatory referral to Pulmonary    Aranesp: Given: Yes    Date and amount of last dose: 06/05/23 at  Last Hgb: 9.8 PRBC's Given: No  ESA dose for discharge: mircera 100 mcg IV q 2 weeks  IV Iron dose at discharge: No  Heparin change: No  EDW Change: Yes New EDW: 72kg-Patient s/p L BKA  Bath Change: Yes. Change to 3K bath, last K+ here was 3.3. More likely will be able to switch back to 2K once his appetite returns to baseline.  Access intervention/Change: No   Hectorol change: No  Discharge Labs: Calcium 7.9 Phosphorus 3.1 Albumin 1.8 K+ 3.3 ( KCL supplementation given 8/13).  IV Antibiotics: No Details: ABXs d/c'd at dc  On Coumadin?: No on Plavix   OTHER/APPTS/LAB ORDERS:    D/C Meds to be reconciled by nurse after every discharge.  Completed By: Salome Holmes, NP   Reviewed by: MD:______ RN_______

## 2023-06-11 NOTE — Progress Notes (Signed)
DISCHARGE NOTE HOME Clenard [place] to be discharged Rehab per MD order. Discussed prescriptions and follow up appointments with the patient. Prescriptions given to patient; medication list explained in detail. Patient verbalized understanding.  Skin clean, dry and intact without evidence of skin break down, no evidence of skin tears noted. IV catheter discontinued intact. Site without signs and symptoms of complications. Dressing and pressure applied. Pt denies pain at the site currently. No complaints noted.  Patient free of lines, drains, and wounds.   An After Visit Summary (AVS) was printed and given to the patient. Patient escorted via wheelchair, and discharged home via private auto.  Velia Meyer, RN

## 2023-06-11 NOTE — Progress Notes (Signed)
[facility] Progress Note   Subjective:    Seen and examined patient at bedside. Tolerated yesterday's HD with net UF 1.2L. K+ today 3.3 and KCL powder given today. Plan for discharge today.  Objective Vitals:   06/10/23 1947 06/10/23 2221 06/11/23 0549 06/11/23 0837  BP: (!) 123/57 125/60 139/65 116/62  Pulse: 79 80 74 74  Resp: 16 16 18 19   Temp: 98.3 F (36.8 C) 98 F (36.7 C) 98.3 F (36.8 C) 98.4 F (36.9 C)  TempSrc: Oral Oral  Oral  SpO2: 100% 100% 100% 100%  Weight:      Height:       Physical Exam General: Alert, elderly male in NAD Heart: RRR, no murmurs, rubs or gallops Lungs: CTA bilaterally, respirations unlabored Abdomen: Soft, non-distended Extremities: L BKA, no edema in RLE Dialysis Access:  LUE AVF  [facility] Weights   06/08/23 1245 06/10/23 1533 06/10/23 1926  Weight: 70.5 kg 73 kg 72 kg    Intake/Output Summary (Last 24 hours) at 06/11/2023 1213 Last data filed at 06/11/2023 0800 Gross per 24 hour  Intake 240 ml  Output 1200 ml  Net -960 ml    Additional Objective Labs: Basic Metabolic Panel: Recent Labs  Lab 06/08/23 0129 06/10/23 0031 06/11/23 0229  NA 130* 133* 131*  K 3.9 3.2* 3.3*  CL 93* 94* 93*  CO2 20* 21* 24  GLUCOSE 371* 183* 234*  BUN 61* 58* 33*  CREATININE 7.19* 5.89* 4.23*  CALCIUM 8.4* 8.2* 7.9*  PHOS 7.1* 4.7* 3.1   Liver Function Tests: Recent Labs  Lab 06/05/23 0826 06/08/23 0129 06/11/23 0229  AST  --  15 18  ALT  --  11 7  ALKPHOS  --  76 67  BILITOT  --  0.4 0.5  PROT  --  7.1 6.7  ALBUMIN 1.6* 1.6* 1.8*   No results for input(s): "LIPASE", "AMYLASE" in the last 168 hours. CBC: Recent Labs  Lab 06/06/23 0750 06/07/23 0840 06/08/23 0129 06/10/23 0031 06/11/23 0229  WBC 17.5* 18.6* 13.6* 11.2* 9.6  NEUTROABS  --   --  12.6*  --  7.6  HGB 9.2* 9.6* 10.0* 10.2* 9.8*  HCT 29.0* 29.9* 30.6* 31.8* 30.3*  MCV 93.5 95.8 94.7 92.7 91.5  PLT 248 277 287 298 286   Blood Culture     Component Value Date/Time   SDES BLOOD BLOOD RIGHT ARM 06/01/2023 1651   SPECREQUEST  06/01/2023 1651    BOTTLES DRAWN AEROBIC ONLY Blood Culture adequate volume   CULT  06/01/2023 1651    NO GROWTH 5 DAYS Performed at [facility], [address], [place], [place] [address]    REPTSTATUS 06/06/2023 FINAL 06/01/2023 1651    Cardiac Enzymes: No results for input(s): "CKTOTAL", "CKMB", "CKMBINDEX", "TROPONINI" in the last 168 hours. CBG: Recent Labs  Lab 06/10/23 0756 06/10/23 1130 06/10/23 2250 06/11/23 0735 06/11/23 1116  GLUCAP 208* 230* 253* 195* 239*   Iron Studies: No results for input(s): "IRON", "TIBC", "TRANSFERRIN", "FERRITIN" in the last 72 hours. Lab Results  Component Value Date   INR 1.4 (H) 05/20/2023   INR 1.4 (H) 03/21/2023   INR 1.3 (H) 03/12/2022   Studies/Results: No results found.  Medications:  sodium chloride     sodium chloride Stopped (06/07/23 1546)   magnesium sulfate bolus IVPB      (feeding supplement) PROSource Plus  30 mL Oral BID BM   acidophilus  2 capsule Oral Daily   apixaban  5 mg Oral BID   vitamin C  1,000 mg Oral Daily   atorvastatin  80 mg Oral Daily   carvedilol  25 mg Oral BID   darbepoetin (ARANESP) injection - DIALYSIS  60 mcg Subcutaneous Q Wed-1800   docusate sodium  100 mg Oral Daily   doxercalciferol  4 mcg Intravenous Q M,W,F-HD   feeding supplement (NEPRO CARB STEADY)  237 mL Oral BID BM   finasteride  5 mg Oral Daily   furosemide  80 mg Oral Once per day on Sunday Tuesday Thursday Saturday   gabapentin  300 mg Oral BID   insulin aspart  0-5 Units Subcutaneous QHS   insulin aspart  0-9 Units Subcutaneous TID WC   isosorbide mononitrate  60 mg Oral Daily   mirtazapine  15 mg Oral QHS   multivitamin  1 tablet Oral QHS   nutrition supplement (JUVEN)  1 packet Oral BID BM   pantoprazole  40 mg Oral Daily   sevelamer carbonate  1,600 mg Oral TID WC   sodium chloride flush  3 mL Intravenous Q12H   tamsulosin   0.4 mg Oral QHS   zinc sulfate  220 mg Oral Daily    Dialysis Orders: AF MWF 4 hrs 180NRE 425/800 82.5 kg 2.0 K/ 2.0 Ca AVF - Heparin 5000 units IV three times per week - Hectorol 5 mcg IV three times per week  Assessment/Plan: 1. L heel wound: S/p L BKA per Dr. Lajoyce Corners. ABXs per primary.  2. ESRD -MWF, off schedule due to surgery 8/9, resume MWF schedule this week.  3. Anemia - HGB now 9.8. ESA given on 8/7, monitor trend, titrate up dose if needed. 4. Secondary hyperparathyroidism - Correct calcium on high side. Decreased VDRA. PO4 now at goal, continue Renvela 2 tabs with meals.  5. HTN/volume - BP is historically labile. High at admit. Now on Amlodipine 5 mg PO daily and Furosemide 80 mg PO on non HD days. Now under OP EDW by weights, especially after recent BKA, will need to adjust EDW at discharge. Bps now stable. UF as tolerated.  6. Nutrition - Low albumin. On protein supplements 7. PAF-on Eliquis/Carvedilol 8. DMT2- per primary 9. FFT-Brother died around early 2023-04-15. He has declined since his brother's death. Not eating. Liberalize diet.  10. Dispo - Okay for discharge from renal standpoint  Salome Holmes, NP [facility] 06/11/2023,12:13 PM  LOS: 21 days

## 2023-06-11 NOTE — Progress Notes (Signed)
Called [facility] to give report  spoke to [place]itt.

## 2023-06-11 NOTE — Progress Notes (Signed)
   06/11/23 1412  Spiritual Encounters  Type of Visit Initial  Care provided to: Significant other  Conversation partners present during encounter Nurse  Referral source Nurse (RN/NT/LPN)  Reason for visit Surgical  OnCall Visit No  Spiritual Framework  Presenting Themes Goals in life/care;Significant life change;Coping tools;Community and relationships  Community/Connection Family  Goals  Self/Personal Goals Return to independance  Clinical Care Goals Sucessfully complete rehab  Interventions  Spiritual Care Interventions Made Established relationship of care and support;Compassionate presence;Reflective listening  Intervention Outcomes  Outcomes Awareness of support   Visited with patient per spiritual consult. Patient is soon to be discharged to SNF for rehab. Patient wants to go home as he feels his family will take care of him. Patient lives alone, however family members live close by. Patient has a brother, two sisters and 3 nieces. Family has visited and been by his side daily. Patient is confident that he will recover and care for himself. However patient does not seem to consider the reality of having amputation below the knee and the new challenges that comes with it. Patient also doesn't like dialysis treatment as it leaves him drained.   Patient says he has an appetite and enjoyed the hospital food, but the medical team visits too often. Says he is a private person and "they ask a lot of questions".   Patient is open to return visits from chaplain.

## 2023-06-11 NOTE — TOC Transition Note (Signed)
Transition of Care [facility]) - CM/SW Discharge Note   Patient Details  Name: William Webb MRN: [id number] Date of Birth: 09/05/1951  Transition of Care [facility]) CM/SW Contact:  Ralene Bathe, LCSW Phone Number: 06/11/2023, 12:48 PM   Clinical Narrative:    Patient will DC to: [facility] Anticipated DC date: 06/11/2023 Family notified: Yes Transport by: Sharin Mons   Per MD patient ready for DC to SNF. RN to call report prior to discharge [phone] room 109p). RN, patient's family, and facility notified of DC. Discharge Summary sent to facility. DC packet on chart. Ambulance transport will be requested for patient.   CSW will sign off for now as social work intervention is no longer needed. Please consult [place] again if new needs arise.    Final next level of care: Skilled Nursing Facility Barriers to Discharge: Barriers Resolved   Patient Goals and CMS Choice CMS Medicare.gov Compare Post Acute Care list provided to:: Patient Choice offered to / list presented to : Patient  Discharge Placement                Patient chooses bed at: [facility] Patient to be transferred to facility by: [facility] Name of family member notified: sister Eber Jones Patient and family notified of of transfer: 06/11/23  Discharge Plan and Services Additional resources added to the After Visit Summary for     Discharge Planning Services: CM Consult                                 Social Determinants of Health (SDOH) Interventions SDOH Screenings   Food Insecurity: No Food Insecurity (05/21/2023)  Housing: Low Risk  (05/21/2023)  Transportation Needs: No Transportation Needs (06/05/2023)  Utilities: Not At Risk (05/21/2023)  Depression (PHQ2-9): Low Risk  (10/07/2020)  Tobacco Use: Medium Risk (06/07/2023)     Readmission Risk Interventions    05/22/2023   11:05 AM 03/22/2023    4:41 PM 03/19/2022    3:43 PM  Readmission Risk Prevention Plan  Transportation Screening  Complete Complete Complete  Medication Review (RN Care Manager) Referral to Pharmacy Complete Complete  PCP or Specialist appointment within 3-5 days of discharge Complete Complete Complete  HRI or Home Care Consult Complete Complete Complete  SW Recovery Care/Counseling Consult Complete Complete Complete  Palliative Care Screening Not Applicable Not Applicable Not Applicable  Skilled Nursing Facility Not Applicable Not Applicable Not Applicable

## 2023-06-11 NOTE — Progress Notes (Signed)
Physical Therapy Treatment Patient Details Name: William Webb [facility] MRN: [id number] DOB: 09-17-1951 Today's Date: 06/11/2023   History of Present Illness Pt is a 72 y.o. 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 PMH includes ESRD (on HD), PAF, HTN, HLD, CAD, PAD, DM2.    PT Comments  Pt received in supine, agreeable to working on EOB transfer and seated balance upon second attempt after finishing lunch, pt soiled and agreeable to assist with rolling and bed linen change prior to sitting up. Pt totalA for hygiene and modA to roll. He performed log roll to/from EOB with up to +2 modA and static/dynamic seated tasks including reaching with up to ModA +1-2. Pt tolerates sitting EOB >10 mins and very poor static/dynamic seated balance when unsupported. Pt too fatigued after seated activities/exercises to attempt standing transfers this date. Pt continues to benefit from PT services to progress toward functional mobility goals.     If plan is discharge home, recommend the following: Two people to help with walking and/or transfers;A lot of help with bathing/dressing/bathroom;Assistance with cooking/housework;Assist for transportation;Help with stairs or ramp for entrance   Can travel by private vehicle     No  Equipment Recommendations  Wheelchair (measurements PT);Wheelchair cushion (measurements PT);Hospital bed;Hoyer lift (Continue to assess next venue)    Recommendations for Other Services       Precautions / Restrictions Precautions Precautions: Fall;Other (comment) Precaution Comments: bladder/bowel incontinence; L residual limb wound vac Required Braces or Orthoses: Other Brace Other Brace: L BKA limb guard Restrictions Weight Bearing Restrictions: Yes LLE Weight Bearing: Non weight bearing Other Position/Activity Restrictions: defer WB on LLE given heel wound, also MD plan for LLE amputation on 8/9      Mobility  Bed Mobility Overal bed mobility: Needs Assistance Bed Mobility: Rolling, Sidelying to Sit, Sit to Sidelying Rolling: Mod assist, Used rails Sidelying to sit: Mod assist, +2 for physical assistance, HOB elevated, Used rails Supine to sit: Mod assist, Used rails, HOB elevated   Sit to sidelying: Mod assist, +2 for safety/equipment, Used rails General bed mobility comments: Pt needing assist at trunk to position into upright sitting and assist with BLE management upon return to supine. Needs cueing to use bed rails to assist with bed mobility, total A for lateral scooting to [facility] with use of draw pad to acheive more mobility.    Transfers Overall transfer level: Needs assistance                 General transfer comment: +2 totalA for single attempt at lateral scooting toward HOB, pt unable to use BUE or RLE to effectively to assist with scooting. Pt with heavy lean sitting EOB and after ~10 mins sitting fatigued and not yet safe to attempt STS.    Ambulation/Gait                   Stairs             Wheelchair Mobility     Tilt Bed    Modified Rankin (Stroke Patients Only)       Balance Overall balance assessment: Needs assistance Sitting-balance support: Single extremity supported Sitting balance-Leahy Scale: Poor Sitting balance - Comments: Strong L lean needing verbal and visual cues to reposition. Pt assisted to place RUEon end of bed and able to sit closer to midline but tends to flex forward at trunk more as fatigue increases; Cues for him to look in  mirror to assist with upright posture. Mod to Max A to assist with maintaining sitting balance, pt tolerated EOB >10 mins today with +1-2 assist Postural control: Left lateral lean, Other (comment) (anterior lean as he fatigues)                                  Cognition Arousal: Lethargic Behavior During Therapy: Flat affect Overall Cognitive Status: Difficult to  assess Area of Impairment: Following commands, Problem solving                       Following Commands: Follows one step commands with increased time Safety/Judgement: Decreased awareness of safety, Decreased awareness of deficits Awareness: Emergent Problem Solving: Slow processing General Comments: Pt following most commands, some delay; pt able to recall some conversations he's had with this PTA from a couple weeks ago, but when asked what he did in last PT session, he states he is unable to recall.        Exercises Other Exercises Other Exercises: cues for seated BUE reaching anterior and laterally, pt able to perform a few reps with each UE, limited due to anterior and lateral trunk lean/instability. Other Exercises: supine RLE AAROM: LAQ x 5 reps Other Exercises: Lateral lean with elbow taps and propping on R elbow x60 sec to work on reorientation to midline (pt with strong L lean throughout unless directed to grasp bed rail with RUE)    General Comments General comments (skin integrity, edema, etc.): BP 105/59 HR 72 bpm sitting EOB; BP taken 83/48 (59) after return to supine HR 70, however BP cuff had broken piece on end so likely inaccurate. Pt with significant BM upon PTA/OT arrival to room, pt assisted to roll L/R for bed linen and hygiene assist, nsg staff present to place new foam dressing over sacrum.      Pertinent Vitals/Pain Pain Assessment Pain Assessment: Faces Faces Pain Scale: Hurts little more Pain Location: L residual limb Pain Descriptors / Indicators: Guarding, Grimacing Pain Intervention(s): Monitored during session, Limited activity within patient's tolerance, Repositioned    Home Living Family/patient expects to be discharged to:: Private residence Living Arrangements: Alone Available Help at Discharge: Family;Available PRN/intermittently Type of Home: Apartment Home Access: Level entry       Home Layout: One level Home Equipment: Clinical biochemist (2 wheels)      Prior Function            PT Goals (current goals can now be found in the care plan section) Acute Rehab PT Goals Patient Stated Goal: post-acute rehab to regain indep before going home PT Goal Formulation: With patient Time For Goal Achievement: 06/22/23 Progress towards PT goals: Progressing toward goals    Frequency    Min 1X/week      PT Plan Current plan remains appropriate    Co-evaluation PT/OT/SLP Co-Evaluation/Treatment: Yes Reason for Co-Treatment: Complexity of the patient's impairments (multi-system involvement);To address functional/ADL transfers;For patient/therapist safety PT goals addressed during session: Mobility/safety with mobility;Balance;Strengthening/ROM OT goals addressed during session: ADL's and self-care      AM-PAC PT "6 Clicks" Mobility   Outcome Measure  Help needed turning from your back to your side while in a flat bed without using bedrails?: A Lot Help needed moving from lying on your back to sitting on the side of a flat bed without using bedrails?: A Lot Help needed moving to and from a  bed to a chair (including a wheelchair)?: Total Help needed standing up from a chair using your arms (e.g., wheelchair or bedside chair)?: Total Help needed to walk in hospital room?: Total Help needed climbing 3-5 steps with a railing? : Total 6 Click Score: 8    End of Session Equipment Utilized During Treatment: Other (comment) (LLE limb guard) Activity Tolerance: Patient tolerated treatment well;Patient limited by lethargy Patient left: in bed;with call bell/phone within reach;with bed alarm set;Other (comment) (RLE prevalon boot donned, limb guard intact, pt request RLE SCD remain off at this time) Nurse Communication: Mobility status;Need for lift equipment;Other (comment) (would need hoyer for OOB) PT Visit Diagnosis: Other abnormalities of gait and mobility (R26.89);Muscle weakness (generalized) (M62.81);Pain Pain -  Right/Left: Left Pain - part of body: Leg     Time: 1610-9604 PT Time Calculation (min) (ACUTE ONLY): 31 min  Charges:    $Therapeutic Exercise: 8-22 mins $Therapeutic Activity: 8-22 mins PT General Charges $$ ACUTE PT VISIT: 1 Visit                      P., PTA Acute Rehabilitation Services Secure Chat Preferred 9a-5:30pm Office: [phone]    William Webb [facility] 06/11/2023, 3:56 PM
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
## 2023-06-11 NOTE — Progress Notes (Signed)
Physical Therapy Treatment Patient Details Name: Becker Ferring [facility] MRN: [id number] DOB: Nov 17, 1950 Today's Date: 06/11/2023   History of Present Illness Pt is a 72 y.o. 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 PMH includes ESRD (on HD), PAF, HTN, HLD, CAD, PAD, DM2.    PT Comments  Pt received in supine, reluctant to participate in therapy session when staff arrived due to pt requesting to eat lunch. Pt however is slouched down in bed and was agreeable to brief session with emphasis on repositioning into more upright posture with better back and UE support which reduces his risk of aspiration and for better back comfort. Pt also agreeable to review LLE brace donning technique and able to assist slightly with hand over hand assist, pt with difficulty securing velcro straps of brace due to decreased fine motor coordination. Encouraged repositioning q2H for pressure relief, pt agreeable, will need reinforcement. +2 modA for posterior supine scooting toward HOB and long sitting in bed from elevated HOB briefly. Pt agreeable to working with therapies later in the day for EOB sitting.    If plan is discharge home, recommend the following: Two people to help with walking and/or transfers;A lot of help with bathing/dressing/bathroom;Assistance with cooking/housework;Assist for transportation;Help with stairs or ramp for entrance   Can travel by private vehicle     No  Equipment Recommendations  Wheelchair (measurements PT);Wheelchair cushion (measurements PT);Hospital bed;Hoyer lift (defer to next venue)    Recommendations for Other Services       Precautions / Restrictions Precautions Precautions: Fall;Other (comment) Precaution Comments: bladder/bowel incontinence; L residual limb wound vac Required Braces or Orthoses: Other Brace Other Brace: L BKA limb guard Restrictions Weight Bearing Restrictions: Yes LLE  Weight Bearing: Non weight bearing     Mobility  Bed Mobility Overal bed mobility: Needs Assistance       Supine to sit: Mod assist, Used rails, HOB elevated     General bed mobility comments: Bed placed in trendelenburg and pt able to reach for overhead rails with increased time/cues to assist with pulling  toward HOB in supine posture, +2 draw pad assist modA to achieve propulsion. ModA to lean trunk forward and using R bed rail to long sitting for pillow placement/adjustment    Transfers                        Ambulation/Gait                   Stairs             Wheelchair Mobility     Tilt Bed    Modified Rankin (Stroke Patients Only)       Balance Overall balance assessment: Needs assistance Sitting-balance support: Single extremity supported Sitting balance-Leahy Scale: Poor Sitting balance - Comments: Pt needing 2 UE support for long sitting attempt in bed, unable to maintain long; wanting to eat                                    Cognition Arousal: Alert Behavior During Therapy: Flat affect Overall Cognitive Status: Difficult to assess Area of Impairment: Safety/judgement, Awareness, Problem solving, Following commands                       Following Commands: Follows one step commands with increased  time Safety/Judgement: Decreased awareness of safety, Decreased awareness of deficits Awareness: Emergent Problem Solving: Slow processing, Requires verbal cues General Comments: Pt agreeable to repositioning in bed for better posture for eating lunch, he agrees to further OT/PT session later in the day to work on seated balance but wants to eat lunch in elevated HOB supine posture prior to EOB tasks.        Exercises Other Exercises Other Exercises: supine LLE AROM: quad sets x5 reps cues for pushing knee straight before/during/after brace placement    General Comments General comments (skin integrity,  edema, etc.): +2 staff assist for placement of pt's limb guard with pt able to follow multimodal instructions given to assist with single strap, however pt with difficulty with fine motor coordination needs assist to effectively adjust the velcro. Pt unable to reach down to lower straps on brace at time of session. Pillow placed under his L elbow to prevent L lean while pt upright in bed to eat      Pertinent Vitals/Pain Pain Assessment Pain Assessment: Faces Faces Pain Scale: Hurts little more Pain Location: L residual limb Pain Descriptors / Indicators: Guarding, Grimacing Pain Intervention(s): Monitored during session, Repositioned     PT Goals (current goals can now be found in the care plan section) Acute Rehab PT Goals Patient Stated Goal: post-acute rehab to regain indep before going home PT Goal Formulation: With patient Time For Goal Achievement: 06/22/23 Progress towards PT goals: Progressing toward goals    Frequency    Min 1X/week      PT Plan Current plan remains appropriate       AM-PAC PT "6 Clicks" Mobility   Outcome Measure  Help needed turning from your back to your side while in a flat bed without using bedrails?: A Lot Help needed moving from lying on your back to sitting on the side of a flat bed without using bedrails?: A Lot Help needed moving to and from a bed to a chair (including a wheelchair)?: Total Help needed standing up from a chair using your arms (e.g., wheelchair or bedside chair)?: Total Help needed to walk in hospital room?: Total Help needed climbing 3-5 steps with a railing? : Total 6 Click Score: 8    End of Session   Activity Tolerance: Patient limited by fatigue;Other (comment) (pt wanting to eat lunch) Patient left: in bed;with bed alarm set;with call bell/phone within reach;Other (comment) (LLE limb guard in place, HOB ~60* to eat, family member in room assisting him) Nurse Communication: Mobility status PT Visit Diagnosis:  Other abnormalities of gait and mobility (R26.89);Muscle weakness (generalized) (M62.81);Pain Pain - Right/Left: Left Pain - part of body: Leg     Time: 0981-1914 PT Time Calculation (min) (ACUTE ONLY): 9 min  Charges:    $Therapeutic Activity: 8-22 mins PT General Charges $$ ACUTE PT VISIT: 1 Visit                      P., PTA Acute Rehabilitation Services Secure Chat Preferred 9a-5:30pm Office: [phone]    [facility] 06/11/2023, 2:57 PM

## 2023-06-12 DIAGNOSIS — N186 End stage renal disease: Secondary | ICD-10-CM | POA: Diagnosis not present

## 2023-06-12 DIAGNOSIS — Z992 Dependence on renal dialysis: Secondary | ICD-10-CM | POA: Diagnosis not present

## 2023-06-12 DIAGNOSIS — E1129 Type 2 diabetes mellitus with other diabetic kidney complication: Secondary | ICD-10-CM | POA: Diagnosis not present

## 2023-06-12 DIAGNOSIS — N2581 Secondary hyperparathyroidism of renal origin: Secondary | ICD-10-CM | POA: Diagnosis not present

## 2023-06-12 DIAGNOSIS — D509 Iron deficiency anemia, unspecified: Secondary | ICD-10-CM | POA: Diagnosis not present

## 2023-06-12 DIAGNOSIS — D631 Anemia in chronic kidney disease: Secondary | ICD-10-CM | POA: Diagnosis not present

## 2023-06-12 DIAGNOSIS — D689 Coagulation defect, unspecified: Secondary | ICD-10-CM | POA: Diagnosis not present

## 2023-06-12 NOTE — Progress Notes (Signed)
Late Note Entry- June 12, 2023  Pt was d/c yesterday. Contacted [facility] this morning to confirm pt's D/C date yesterday and that pt should resume care today.   Olivia Canter Renal Navigator [phone]

## 2023-06-12 NOTE — Consult Note (Signed)
   [facility] CM Inpatient Consult   06/12/2023  [facility] 11/20/1950 [id number]  [facility] [[facility]]  Accountable Care Organization [ACO] Patient: Advertising copy[employer] Medicare  Primary Care Provider:  Irven Coe, MD, Deboraha Sprang at [facility]  Patient was reviewed for 21 day long length of stay barriers to care and transition to SNF  Patient was screened for hospitalization and on behalf of [facility] Care Coordination to assess for post hospital community care needs.  Patient is being considered for a skilled nursing facility level of care for post hospital transition.  Patient transitioned to [facility] and patient can be followed by [facility] RN with traditional Medicare and approved Medicare Advantage plans.    Plan:   Will notify the Community [facility] RN can follow for any known or needs for transitional care needs for returning to post facility care coordination needs to return to community.  For questions or referrals, please contact:   Charlesetta Shanks, RN BSN CCM [facility]  [phone] business mobile phone Toll free office [phone]  Fax number: [phone] [email or web address]

## 2023-06-12 NOTE — Telephone Encounter (Signed)
No action done

## 2023-06-13 DIAGNOSIS — I10 Essential (primary) hypertension: Secondary | ICD-10-CM | POA: Diagnosis not present

## 2023-06-13 DIAGNOSIS — Z89512 Acquired absence of left leg below knee: Secondary | ICD-10-CM | POA: Diagnosis not present

## 2023-06-13 DIAGNOSIS — N186 End stage renal disease: Secondary | ICD-10-CM | POA: Diagnosis not present

## 2023-06-13 DIAGNOSIS — I739 Peripheral vascular disease, unspecified: Secondary | ICD-10-CM | POA: Diagnosis not present

## 2023-06-14 ENCOUNTER — Telehealth: Payer: Self-pay

## 2023-06-14 DIAGNOSIS — N2581 Secondary hyperparathyroidism of renal origin: Secondary | ICD-10-CM | POA: Diagnosis not present

## 2023-06-14 DIAGNOSIS — Z992 Dependence on renal dialysis: Secondary | ICD-10-CM | POA: Diagnosis not present

## 2023-06-14 DIAGNOSIS — D631 Anemia in chronic kidney disease: Secondary | ICD-10-CM | POA: Diagnosis not present

## 2023-06-14 DIAGNOSIS — N186 End stage renal disease: Secondary | ICD-10-CM | POA: Diagnosis not present

## 2023-06-14 DIAGNOSIS — E1129 Type 2 diabetes mellitus with other diabetic kidney complication: Secondary | ICD-10-CM | POA: Diagnosis not present

## 2023-06-14 DIAGNOSIS — D689 Coagulation defect, unspecified: Secondary | ICD-10-CM | POA: Diagnosis not present

## 2023-06-14 DIAGNOSIS — D509 Iron deficiency anemia, unspecified: Secondary | ICD-10-CM | POA: Diagnosis not present

## 2023-06-14 NOTE — Telephone Encounter (Signed)
Called [facility] [phone] to make an appt for Tuesday or Thursday of next week. LM O NV for tammy and ask that she return my call.

## 2023-06-14 NOTE — Telephone Encounter (Signed)
-----   Message from RMA Jadyn Barge F sent at 06/12/2023  4:15 PM EDT ----- Regarding: RE: monitor Message to the front desk for appt. ----- Message ----- From: Rodena Medin, RMA Sent: 06/11/2023   8:37 AM EDT To: Rodena Medin, RMA Subject: RE: monitor                                    Pt will d/c to [facility] tomorrow will hold to monitor and then call for appt. ----- Message ----- From: Rodena Medin, RMA Sent: 06/07/2023  12:45 PM EDT To: Rodena Medin, RMA Subject: RE: monitor                                    38 micro ----- Message ----- From: Rodena Medin, RMA Sent: 06/07/2023  10:23 AM EDT To: Rodena Medin, RMA Subject: monitor                                        06/07/2023 left BKA monitor for graft and d/c

## 2023-06-17 DIAGNOSIS — Z992 Dependence on renal dialysis: Secondary | ICD-10-CM | POA: Diagnosis not present

## 2023-06-17 DIAGNOSIS — N186 End stage renal disease: Secondary | ICD-10-CM | POA: Diagnosis not present

## 2023-06-17 DIAGNOSIS — D689 Coagulation defect, unspecified: Secondary | ICD-10-CM | POA: Diagnosis not present

## 2023-06-17 DIAGNOSIS — D631 Anemia in chronic kidney disease: Secondary | ICD-10-CM | POA: Diagnosis not present

## 2023-06-17 DIAGNOSIS — D509 Iron deficiency anemia, unspecified: Secondary | ICD-10-CM | POA: Diagnosis not present

## 2023-06-17 DIAGNOSIS — N2581 Secondary hyperparathyroidism of renal origin: Secondary | ICD-10-CM | POA: Diagnosis not present

## 2023-06-17 DIAGNOSIS — E1129 Type 2 diabetes mellitus with other diabetic kidney complication: Secondary | ICD-10-CM | POA: Diagnosis not present

## 2023-06-18 ENCOUNTER — Telehealth: Payer: Self-pay | Admitting: Orthopedic Surgery

## 2023-06-18 DIAGNOSIS — Z89512 Acquired absence of left leg below knee: Secondary | ICD-10-CM | POA: Diagnosis not present

## 2023-06-18 DIAGNOSIS — I10 Essential (primary) hypertension: Secondary | ICD-10-CM | POA: Diagnosis not present

## 2023-06-18 DIAGNOSIS — I739 Peripheral vascular disease, unspecified: Secondary | ICD-10-CM | POA: Diagnosis not present

## 2023-06-18 DIAGNOSIS — E46 Unspecified protein-calorie malnutrition: Secondary | ICD-10-CM | POA: Diagnosis not present

## 2023-06-18 DIAGNOSIS — Z4781 Encounter for orthopedic aftercare following surgical amputation: Secondary | ICD-10-CM | POA: Diagnosis not present

## 2023-06-18 DIAGNOSIS — N186 End stage renal disease: Secondary | ICD-10-CM | POA: Diagnosis not present

## 2023-06-18 NOTE — Telephone Encounter (Signed)
Pt is sch 8/22 @2 :00 pm

## 2023-06-18 NOTE — Telephone Encounter (Signed)
I called and sw tammy pt is sch for 06/20/2023

## 2023-06-18 NOTE — Telephone Encounter (Signed)
Tammy from [facility] called and said you could get the patient in. CB#[phone] ext [phone]

## 2023-06-19 DIAGNOSIS — D509 Iron deficiency anemia, unspecified: Secondary | ICD-10-CM | POA: Diagnosis not present

## 2023-06-19 DIAGNOSIS — Z992 Dependence on renal dialysis: Secondary | ICD-10-CM | POA: Diagnosis not present

## 2023-06-19 DIAGNOSIS — E1129 Type 2 diabetes mellitus with other diabetic kidney complication: Secondary | ICD-10-CM | POA: Diagnosis not present

## 2023-06-19 DIAGNOSIS — D689 Coagulation defect, unspecified: Secondary | ICD-10-CM | POA: Diagnosis not present

## 2023-06-19 DIAGNOSIS — N2581 Secondary hyperparathyroidism of renal origin: Secondary | ICD-10-CM | POA: Diagnosis not present

## 2023-06-19 DIAGNOSIS — D631 Anemia in chronic kidney disease: Secondary | ICD-10-CM | POA: Diagnosis not present

## 2023-06-19 DIAGNOSIS — N186 End stage renal disease: Secondary | ICD-10-CM | POA: Diagnosis not present

## 2023-06-20 ENCOUNTER — Ambulatory Visit (INDEPENDENT_AMBULATORY_CARE_PROVIDER_SITE_OTHER): Payer: Medicare Other | Admitting: Orthopedic Surgery

## 2023-06-20 ENCOUNTER — Ambulatory Visit: Payer: Medicare Other | Admitting: Internal Medicine

## 2023-06-20 ENCOUNTER — Other Ambulatory Visit: Payer: Self-pay

## 2023-06-20 DIAGNOSIS — Z89512 Acquired absence of left leg below knee: Secondary | ICD-10-CM

## 2023-06-21 DIAGNOSIS — D631 Anemia in chronic kidney disease: Secondary | ICD-10-CM | POA: Diagnosis not present

## 2023-06-21 DIAGNOSIS — E1129 Type 2 diabetes mellitus with other diabetic kidney complication: Secondary | ICD-10-CM | POA: Diagnosis not present

## 2023-06-21 DIAGNOSIS — Z992 Dependence on renal dialysis: Secondary | ICD-10-CM | POA: Diagnosis not present

## 2023-06-21 DIAGNOSIS — N186 End stage renal disease: Secondary | ICD-10-CM | POA: Diagnosis not present

## 2023-06-21 DIAGNOSIS — D509 Iron deficiency anemia, unspecified: Secondary | ICD-10-CM | POA: Diagnosis not present

## 2023-06-21 DIAGNOSIS — N2581 Secondary hyperparathyroidism of renal origin: Secondary | ICD-10-CM | POA: Diagnosis not present

## 2023-06-21 DIAGNOSIS — D689 Coagulation defect, unspecified: Secondary | ICD-10-CM | POA: Diagnosis not present

## 2023-06-22 DIAGNOSIS — Z4781 Encounter for orthopedic aftercare following surgical amputation: Secondary | ICD-10-CM | POA: Diagnosis not present

## 2023-06-22 DIAGNOSIS — N186 End stage renal disease: Secondary | ICD-10-CM | POA: Diagnosis not present

## 2023-06-22 DIAGNOSIS — R531 Weakness: Secondary | ICD-10-CM | POA: Diagnosis not present

## 2023-06-22 DIAGNOSIS — Z992 Dependence on renal dialysis: Secondary | ICD-10-CM | POA: Diagnosis not present

## 2023-06-24 DIAGNOSIS — E1129 Type 2 diabetes mellitus with other diabetic kidney complication: Secondary | ICD-10-CM | POA: Diagnosis not present

## 2023-06-24 DIAGNOSIS — Z992 Dependence on renal dialysis: Secondary | ICD-10-CM | POA: Diagnosis not present

## 2023-06-24 DIAGNOSIS — D689 Coagulation defect, unspecified: Secondary | ICD-10-CM | POA: Diagnosis not present

## 2023-06-24 DIAGNOSIS — D509 Iron deficiency anemia, unspecified: Secondary | ICD-10-CM | POA: Diagnosis not present

## 2023-06-24 DIAGNOSIS — D631 Anemia in chronic kidney disease: Secondary | ICD-10-CM | POA: Diagnosis not present

## 2023-06-24 DIAGNOSIS — E43 Unspecified severe protein-calorie malnutrition: Secondary | ICD-10-CM | POA: Diagnosis not present

## 2023-06-24 DIAGNOSIS — I10 Essential (primary) hypertension: Secondary | ICD-10-CM | POA: Diagnosis not present

## 2023-06-24 DIAGNOSIS — N2581 Secondary hyperparathyroidism of renal origin: Secondary | ICD-10-CM | POA: Diagnosis not present

## 2023-06-24 DIAGNOSIS — E113411 Type 2 diabetes mellitus with severe nonproliferative diabetic retinopathy with macular edema, right eye: Secondary | ICD-10-CM | POA: Diagnosis not present

## 2023-06-24 DIAGNOSIS — N186 End stage renal disease: Secondary | ICD-10-CM | POA: Diagnosis not present

## 2023-06-24 DIAGNOSIS — S81802A Unspecified open wound, left lower leg, initial encounter: Secondary | ICD-10-CM | POA: Diagnosis not present

## 2023-06-26 DIAGNOSIS — N2581 Secondary hyperparathyroidism of renal origin: Secondary | ICD-10-CM | POA: Diagnosis not present

## 2023-06-26 DIAGNOSIS — D631 Anemia in chronic kidney disease: Secondary | ICD-10-CM | POA: Diagnosis not present

## 2023-06-26 DIAGNOSIS — E1129 Type 2 diabetes mellitus with other diabetic kidney complication: Secondary | ICD-10-CM | POA: Diagnosis not present

## 2023-06-26 DIAGNOSIS — D509 Iron deficiency anemia, unspecified: Secondary | ICD-10-CM | POA: Diagnosis not present

## 2023-06-26 DIAGNOSIS — N186 End stage renal disease: Secondary | ICD-10-CM | POA: Diagnosis not present

## 2023-06-26 DIAGNOSIS — Z992 Dependence on renal dialysis: Secondary | ICD-10-CM | POA: Diagnosis not present

## 2023-06-26 DIAGNOSIS — D689 Coagulation defect, unspecified: Secondary | ICD-10-CM | POA: Diagnosis not present

## 2023-06-27 ENCOUNTER — Ambulatory Visit: Payer: Medicare Other | Admitting: Gastroenterology

## 2023-06-27 NOTE — Progress Notes (Deleted)
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                          3D EF:        46 %  LV EDV:       191 ml                          LV ESV:       103 ml                          LV SV:        88 ml RIGHT VENTRICLE             IVC RV Basal diam:  3.90 cm     IVC diam: 1.90 cm RV S prime:     11.90 cm/s LEFT ATRIUM              Index        RIGHT ATRIUM           Index LA diam:        4.70 cm  2.28 cm/m   RA Area:     21.90 cm LA Vol (A2C):   103.0 ml 49.97 ml/m  RA Volume:   64.70 ml  31.39 ml/m LA Vol (A4C):   87.9 ml  42.62 ml/m LA Biplane Vol: 112.0 ml 54.33 ml/m  AORTIC VALVE                     PULMONIC VALVE AV Area (Vmax):    1.51 cm      PV Vmax:          1.50 m/s AV Area (Vmean):   1.51 cm      PV Peak grad:     9.0 mmHg AV Area (VTI):     1.58 cm      PR End Diast Vel: 5.48 msec AV Vmax:           236.00 cm/s AV Vmean:          156.500 cm/s AV VTI:            0.451 m AV Peak Grad:      22.3 mmHg AV Mean Grad:      12.0 mmHg LVOT Vmax:         102.75 cm/s LVOT Vmean:        68.100 cm/s LVOT VTI:          0.206 m LVOT/AV VTI ratio: 0.46  AORTA Ao Root diam: 2.90 cm Ao Asc diam:  3.40 cm MITRAL VALVE                TRICUSPID VALVE MV Area (PHT): 3.94 cm     TR Peak grad:   25.0 mmHg MV  Decel Time: 192 msec     TR Vmax:        250.00 cm/s MV E velocity: 113.33 cm/s MV A velocity: 66.87 cm/s   SHUNTS MV E/A ratio:  1.69         Systemic VTI:  0.21 m                             Systemic Diam: 2.10 cm Dalton McleanMD Electronically signed by Wilfred Lacy Signature Date/Time: 05/28/2023/5:37:32 PM    Final     Physical Exam: There were no vitals taken for this visit. Constitutional: Pleasant,well-developed, ***male in no acute distress. HEENT: Normocephalic and atraumatic. Conjunctivae are normal. No scleral icterus. Neck supple.  Cardiovascular: Normal rate, regular rhythm.  Pulmonary/chest: Effort normal and  breath sounds normal. No wheezing, rales or rhonchi. Abdominal: Soft, nondistended, nontender. Bowel sounds active throughout. There are no masses palpable. No hepatomegaly. Extremities: no edema Lymphadenopathy: No cervical adenopathy noted. Neurological: Alert and oriented to person place and time. Skin: Skin is warm and dry. No rashes noted. Psychiatric: Normal mood and affect. Behavior is normal.   ASSESSMENT: 72 y.o. male here for assessment of the following  No diagnosis found.  PLAN:   Irven Coe, MD

## 2023-06-28 DIAGNOSIS — Z992 Dependence on renal dialysis: Secondary | ICD-10-CM | POA: Diagnosis not present

## 2023-06-28 DIAGNOSIS — D631 Anemia in chronic kidney disease: Secondary | ICD-10-CM | POA: Diagnosis not present

## 2023-06-28 DIAGNOSIS — D689 Coagulation defect, unspecified: Secondary | ICD-10-CM | POA: Diagnosis not present

## 2023-06-28 DIAGNOSIS — D509 Iron deficiency anemia, unspecified: Secondary | ICD-10-CM | POA: Diagnosis not present

## 2023-06-28 DIAGNOSIS — N186 End stage renal disease: Secondary | ICD-10-CM | POA: Diagnosis not present

## 2023-06-28 DIAGNOSIS — E1129 Type 2 diabetes mellitus with other diabetic kidney complication: Secondary | ICD-10-CM | POA: Diagnosis not present

## 2023-06-28 DIAGNOSIS — N2581 Secondary hyperparathyroidism of renal origin: Secondary | ICD-10-CM | POA: Diagnosis not present

## 2023-06-29 DIAGNOSIS — Z4781 Encounter for orthopedic aftercare following surgical amputation: Secondary | ICD-10-CM | POA: Diagnosis not present

## 2023-06-29 DIAGNOSIS — S81802D Unspecified open wound, left lower leg, subsequent encounter: Secondary | ICD-10-CM | POA: Diagnosis not present

## 2023-06-29 DIAGNOSIS — N186 End stage renal disease: Secondary | ICD-10-CM | POA: Diagnosis not present

## 2023-06-29 DIAGNOSIS — Z992 Dependence on renal dialysis: Secondary | ICD-10-CM | POA: Diagnosis not present

## 2023-06-29 DIAGNOSIS — E1129 Type 2 diabetes mellitus with other diabetic kidney complication: Secondary | ICD-10-CM | POA: Diagnosis not present

## 2023-06-29 DIAGNOSIS — Z89512 Acquired absence of left leg below knee: Secondary | ICD-10-CM | POA: Diagnosis not present

## 2023-07-01 DIAGNOSIS — Z992 Dependence on renal dialysis: Secondary | ICD-10-CM | POA: Diagnosis not present

## 2023-07-01 DIAGNOSIS — N2581 Secondary hyperparathyroidism of renal origin: Secondary | ICD-10-CM | POA: Diagnosis not present

## 2023-07-01 DIAGNOSIS — N186 End stage renal disease: Secondary | ICD-10-CM | POA: Diagnosis not present

## 2023-07-01 DIAGNOSIS — R110 Nausea: Secondary | ICD-10-CM | POA: Diagnosis not present

## 2023-07-01 DIAGNOSIS — R197 Diarrhea, unspecified: Secondary | ICD-10-CM | POA: Diagnosis not present

## 2023-07-01 DIAGNOSIS — D631 Anemia in chronic kidney disease: Secondary | ICD-10-CM | POA: Diagnosis not present

## 2023-07-01 DIAGNOSIS — D689 Coagulation defect, unspecified: Secondary | ICD-10-CM | POA: Diagnosis not present

## 2023-07-01 DIAGNOSIS — D509 Iron deficiency anemia, unspecified: Secondary | ICD-10-CM | POA: Diagnosis not present

## 2023-07-01 DIAGNOSIS — E1129 Type 2 diabetes mellitus with other diabetic kidney complication: Secondary | ICD-10-CM | POA: Diagnosis not present

## 2023-07-02 ENCOUNTER — Encounter: Payer: Self-pay | Admitting: Orthopedic Surgery

## 2023-07-02 DIAGNOSIS — C678 Malignant neoplasm of overlapping sites of bladder: Secondary | ICD-10-CM | POA: Diagnosis not present

## 2023-07-02 NOTE — Progress Notes (Signed)
Patient is a 72 year old gentleman is 2 weeks status post left below-knee amputation.  The incision is healing well there is no cellulitis odor or drainage.  Plan to follow-up in 2 weeks to remove the staples.  Continue with compression and cleansing with soap and water.

## 2023-07-03 DIAGNOSIS — D509 Iron deficiency anemia, unspecified: Secondary | ICD-10-CM | POA: Diagnosis not present

## 2023-07-03 DIAGNOSIS — E1129 Type 2 diabetes mellitus with other diabetic kidney complication: Secondary | ICD-10-CM | POA: Diagnosis not present

## 2023-07-03 DIAGNOSIS — S81802D Unspecified open wound, left lower leg, subsequent encounter: Secondary | ICD-10-CM | POA: Diagnosis not present

## 2023-07-03 DIAGNOSIS — D689 Coagulation defect, unspecified: Secondary | ICD-10-CM | POA: Diagnosis not present

## 2023-07-03 DIAGNOSIS — N186 End stage renal disease: Secondary | ICD-10-CM | POA: Diagnosis not present

## 2023-07-03 DIAGNOSIS — S81802A Unspecified open wound, left lower leg, initial encounter: Secondary | ICD-10-CM | POA: Diagnosis not present

## 2023-07-03 DIAGNOSIS — R110 Nausea: Secondary | ICD-10-CM | POA: Diagnosis not present

## 2023-07-03 DIAGNOSIS — R197 Diarrhea, unspecified: Secondary | ICD-10-CM | POA: Diagnosis not present

## 2023-07-03 DIAGNOSIS — D631 Anemia in chronic kidney disease: Secondary | ICD-10-CM | POA: Diagnosis not present

## 2023-07-03 DIAGNOSIS — Z992 Dependence on renal dialysis: Secondary | ICD-10-CM | POA: Diagnosis not present

## 2023-07-03 DIAGNOSIS — L97509 Non-pressure chronic ulcer of other part of unspecified foot with unspecified severity: Secondary | ICD-10-CM | POA: Diagnosis not present

## 2023-07-03 DIAGNOSIS — N2581 Secondary hyperparathyroidism of renal origin: Secondary | ICD-10-CM | POA: Diagnosis not present

## 2023-07-03 DIAGNOSIS — Z4781 Encounter for orthopedic aftercare following surgical amputation: Secondary | ICD-10-CM | POA: Diagnosis not present

## 2023-07-03 DIAGNOSIS — E113411 Type 2 diabetes mellitus with severe nonproliferative diabetic retinopathy with macular edema, right eye: Secondary | ICD-10-CM | POA: Diagnosis not present

## 2023-07-03 DIAGNOSIS — L89616 Pressure-induced deep tissue damage of right heel: Secondary | ICD-10-CM | POA: Diagnosis not present

## 2023-07-03 DIAGNOSIS — R531 Weakness: Secondary | ICD-10-CM | POA: Diagnosis not present

## 2023-07-03 DIAGNOSIS — E43 Unspecified severe protein-calorie malnutrition: Secondary | ICD-10-CM | POA: Diagnosis not present

## 2023-07-04 DIAGNOSIS — I739 Peripheral vascular disease, unspecified: Secondary | ICD-10-CM | POA: Diagnosis not present

## 2023-07-04 DIAGNOSIS — Z4781 Encounter for orthopedic aftercare following surgical amputation: Secondary | ICD-10-CM | POA: Diagnosis not present

## 2023-07-04 DIAGNOSIS — R531 Weakness: Secondary | ICD-10-CM | POA: Diagnosis not present

## 2023-07-04 DIAGNOSIS — L89616 Pressure-induced deep tissue damage of right heel: Secondary | ICD-10-CM | POA: Diagnosis not present

## 2023-07-04 DIAGNOSIS — S81802D Unspecified open wound, left lower leg, subsequent encounter: Secondary | ICD-10-CM | POA: Diagnosis not present

## 2023-07-05 DIAGNOSIS — D689 Coagulation defect, unspecified: Secondary | ICD-10-CM | POA: Diagnosis not present

## 2023-07-05 DIAGNOSIS — D631 Anemia in chronic kidney disease: Secondary | ICD-10-CM | POA: Diagnosis not present

## 2023-07-05 DIAGNOSIS — R110 Nausea: Secondary | ICD-10-CM | POA: Diagnosis not present

## 2023-07-05 DIAGNOSIS — E1129 Type 2 diabetes mellitus with other diabetic kidney complication: Secondary | ICD-10-CM | POA: Diagnosis not present

## 2023-07-05 DIAGNOSIS — R197 Diarrhea, unspecified: Secondary | ICD-10-CM | POA: Diagnosis not present

## 2023-07-05 DIAGNOSIS — N2581 Secondary hyperparathyroidism of renal origin: Secondary | ICD-10-CM | POA: Diagnosis not present

## 2023-07-05 DIAGNOSIS — D509 Iron deficiency anemia, unspecified: Secondary | ICD-10-CM | POA: Diagnosis not present

## 2023-07-05 DIAGNOSIS — E113411 Type 2 diabetes mellitus with severe nonproliferative diabetic retinopathy with macular edema, right eye: Secondary | ICD-10-CM | POA: Diagnosis not present

## 2023-07-05 DIAGNOSIS — Z992 Dependence on renal dialysis: Secondary | ICD-10-CM | POA: Diagnosis not present

## 2023-07-05 DIAGNOSIS — N186 End stage renal disease: Secondary | ICD-10-CM | POA: Diagnosis not present

## 2023-07-06 DIAGNOSIS — N186 End stage renal disease: Secondary | ICD-10-CM | POA: Diagnosis not present

## 2023-07-06 DIAGNOSIS — R531 Weakness: Secondary | ICD-10-CM | POA: Diagnosis not present

## 2023-07-06 DIAGNOSIS — D649 Anemia, unspecified: Secondary | ICD-10-CM | POA: Diagnosis not present

## 2023-07-06 DIAGNOSIS — E871 Hypo-osmolality and hyponatremia: Secondary | ICD-10-CM | POA: Diagnosis not present

## 2023-07-06 DIAGNOSIS — Z992 Dependence on renal dialysis: Secondary | ICD-10-CM | POA: Diagnosis not present

## 2023-07-08 DIAGNOSIS — Z992 Dependence on renal dialysis: Secondary | ICD-10-CM | POA: Diagnosis not present

## 2023-07-08 DIAGNOSIS — L97411 Non-pressure chronic ulcer of right heel and midfoot limited to breakdown of skin: Secondary | ICD-10-CM | POA: Diagnosis not present

## 2023-07-08 DIAGNOSIS — N186 End stage renal disease: Secondary | ICD-10-CM | POA: Diagnosis not present

## 2023-07-08 DIAGNOSIS — D689 Coagulation defect, unspecified: Secondary | ICD-10-CM | POA: Diagnosis not present

## 2023-07-08 DIAGNOSIS — D509 Iron deficiency anemia, unspecified: Secondary | ICD-10-CM | POA: Diagnosis not present

## 2023-07-08 DIAGNOSIS — E113411 Type 2 diabetes mellitus with severe nonproliferative diabetic retinopathy with macular edema, right eye: Secondary | ICD-10-CM | POA: Diagnosis not present

## 2023-07-08 DIAGNOSIS — N2581 Secondary hyperparathyroidism of renal origin: Secondary | ICD-10-CM | POA: Diagnosis not present

## 2023-07-08 DIAGNOSIS — E1129 Type 2 diabetes mellitus with other diabetic kidney complication: Secondary | ICD-10-CM | POA: Diagnosis not present

## 2023-07-08 DIAGNOSIS — E43 Unspecified severe protein-calorie malnutrition: Secondary | ICD-10-CM | POA: Diagnosis not present

## 2023-07-08 DIAGNOSIS — D631 Anemia in chronic kidney disease: Secondary | ICD-10-CM | POA: Diagnosis not present

## 2023-07-08 DIAGNOSIS — R197 Diarrhea, unspecified: Secondary | ICD-10-CM | POA: Diagnosis not present

## 2023-07-08 DIAGNOSIS — L89616 Pressure-induced deep tissue damage of right heel: Secondary | ICD-10-CM | POA: Diagnosis not present

## 2023-07-08 DIAGNOSIS — R110 Nausea: Secondary | ICD-10-CM | POA: Diagnosis not present

## 2023-07-09 ENCOUNTER — Encounter: Payer: Self-pay | Admitting: Family

## 2023-07-09 ENCOUNTER — Ambulatory Visit (INDEPENDENT_AMBULATORY_CARE_PROVIDER_SITE_OTHER): Payer: Medicare Other | Admitting: Family

## 2023-07-09 DIAGNOSIS — L89611 Pressure ulcer of right heel, stage 1: Secondary | ICD-10-CM

## 2023-07-09 DIAGNOSIS — Z89512 Acquired absence of left leg below knee: Secondary | ICD-10-CM
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
## 2023-07-10 DIAGNOSIS — N186 End stage renal disease: Secondary | ICD-10-CM | POA: Diagnosis not present

## 2023-07-10 DIAGNOSIS — N2581 Secondary hyperparathyroidism of renal origin: Secondary | ICD-10-CM | POA: Diagnosis not present

## 2023-07-10 DIAGNOSIS — R110 Nausea: Secondary | ICD-10-CM | POA: Diagnosis not present

## 2023-07-10 DIAGNOSIS — D509 Iron deficiency anemia, unspecified: Secondary | ICD-10-CM | POA: Diagnosis not present

## 2023-07-10 DIAGNOSIS — D631 Anemia in chronic kidney disease: Secondary | ICD-10-CM | POA: Diagnosis not present

## 2023-07-10 DIAGNOSIS — E1129 Type 2 diabetes mellitus with other diabetic kidney complication: Secondary | ICD-10-CM | POA: Diagnosis not present

## 2023-07-10 DIAGNOSIS — R197 Diarrhea, unspecified: Secondary | ICD-10-CM | POA: Diagnosis not present

## 2023-07-10 DIAGNOSIS — Z992 Dependence on renal dialysis: Secondary | ICD-10-CM | POA: Diagnosis not present

## 2023-07-10 DIAGNOSIS — D689 Coagulation defect, unspecified: Secondary | ICD-10-CM | POA: Diagnosis not present

## 2023-07-11 ENCOUNTER — Ambulatory Visit: Payer: Medicare Other | Admitting: Cardiology

## 2023-07-11 ENCOUNTER — Encounter: Payer: Self-pay | Admitting: Cardiology

## 2023-07-11 VITALS — BP 135/64 | HR 74 | Resp 16 | Ht 75.0 in

## 2023-07-11 DIAGNOSIS — I350 Nonrheumatic aortic (valve) stenosis: Secondary | ICD-10-CM | POA: Diagnosis not present

## 2023-07-11 DIAGNOSIS — I739 Peripheral vascular disease, unspecified: Secondary | ICD-10-CM

## 2023-07-11 DIAGNOSIS — I70221 Atherosclerosis of native arteries of extremities with rest pain, right leg: Secondary | ICD-10-CM | POA: Diagnosis not present

## 2023-07-11 DIAGNOSIS — I480 Paroxysmal atrial fibrillation: Secondary | ICD-10-CM | POA: Diagnosis not present

## 2023-07-11 NOTE — Progress Notes (Signed)
Patient is here for follow up visit.  Subjective:   Myra Giacone [facility], male    DOB: [birth year], 72 y.o.   MRN: [id number]   Chief Complaint  Patient presents with   PAD (peripheral artery disease) (HCC)   HPI  72 y.o. 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 Lajoyce Corners. He also has a non healing wound on right heel for which he is seeing wound care center. He previously underwent Rt TP trunk revascularization that limited amputation to 2nd toe as opposed to further higher amputation (02/2022).     Current Outpatient Medications:    acetaminophen (TYLENOL) 325 MG tablet, Take 1-2 tablets (325-650 mg total) by mouth every 6 (six) hours as needed for mild pain (pain score 1-3 or temp > 100.5)., Disp: , Rfl:    apixaban (ELIQUIS) 5 MG TABS tablet, Take 1 tablet (5 mg total) by mouth 2 (two) times daily., Disp: 180 tablet, Rfl: 3   aspirin EC 81 MG tablet, Take 81 mg by mouth daily. Swallow whole., Disp: , Rfl:    atorvastatin (LIPITOR) 80 MG tablet, Take 1 tablet (80 mg total) by mouth daily., Disp: , Rfl:    carvedilol (COREG) 25 MG tablet, Take 1 tablet (25 mg total) by mouth 2 (two) times daily., Disp: , Rfl:    ethyl chloride spray, Apply 1 Application topically 3 (three) times a week. At dialysis, Disp: , Rfl:    finasteride (PROSCAR) 5 MG tablet, Take 5 mg by mouth daily. (Patient not taking: Reported on 05/21/2023), Disp: , Rfl:    gabapentin (NEURONTIN) 600 MG tablet, Take 0.5 tablets (300 mg total) by mouth 2 (two) times daily. (Patient taking differently: Take 600 mg by mouth 2 (two) times daily.), Disp: 60 tablet, Rfl: 0   isosorbide mononitrate (IMDUR) 60 MG 24 hr tablet, Take 1 tablet (60 mg total) by mouth daily.,  Disp: 30 tablet, Rfl: 0   lidocaine-prilocaine (EMLA) cream, Apply 1 application  topically as needed (prior to port being accessed)., Disp: , Rfl:    linaclotide (LINZESS) 145 MCG CAPS capsule, Take 145 mcg by mouth as needed (constipation)., Disp: , Rfl:    loratadine (CLARITIN) 10 MG tablet, Take 10 mg by mouth daily as needed for allergies., Disp: , Rfl:    mirtazapine (REMERON SOL-TAB) 15 MG disintegrating tablet, Take 1 tablet (15 mg total) by mouth at bedtime., Disp: , Rfl:    omeprazole (PRILOSEC) 20 MG capsule, Take 20 mg by mouth daily., Disp: , Rfl:    polyethylene glycol powder (GLYCOLAX/MIRALAX) 17 GM/SCOOP powder, Take 17 g by mouth in the morning, at noon, and at bedtime. One scoop in beverage of your choice with each meal. You may increase if you continue to be constipated and decrease if your stool becomes too loose. (Patient taking differently: Take 17 g by mouth daily as needed for moderate constipation.), Disp: 255 g, Rfl: 0   sevelamer carbonate (RENVELA) 800 MG tablet, Take 2 tablets (1,600 mg total) by mouth 3 (three) times daily with meals., Disp: , Rfl:    tamsulosin (FLOMAX) 0.4 MG CAPS capsule, Take 0.4 mg by mouth at bedtime. (Patient not taking: Reported on 05/21/2023), Disp: , Rfl:   Cardiovascular studies:  EKG  09/27/2022: Sinus rhythm 64 bpm  LVH Nonspecific T-abnormality  Echocardiogram 03/12/2023:  Normal LV systolic function with visual EF 55-60%. Left ventricle cavity  is normal in size. Severe concentric hypertrophy of the left ventricle.  Normal global wall motion. Doppler evidence of grade II (pseudonormal)  diastolic dysfunction, elevated LAP. Calculated EF 52%.  Left atrial cavity is severely dilated at 53.8 ml/m^2.  Right atrial cavity is moderately dilated.  Trileaflet aortic valve with no regurgitation. Mild aortic valve leaflet  calcification. Mild-moderate aortic stenosis. AV Pk Grad of 32.5 mmHg.  mean gradient 14.7, AVA 1.07. Pk vel 2.85.  Previously Vmax 2.1 m/sec, mean  PG 10 mmHg.  Structurally normal mitral valve. Mild to moderate mitral regurgitation.  Structurally normal tricuspid valve. Moderate tricuspid regurgitation.  Mild pulmonary hypertension. RVSP measures 46 mmHg.  Pericardium is normal. Trace pericardial effusion. There is no hemodynamic  significance.  Compared to 10/2020, AS has slightly progressed and there is now grade II  DD.   LEA duplex 03/17/2022: Right: 50-74% stenosis noted in the superficial femoral artery. 30-49%  stenosis noted in the peroneal artery. Diffuse calcific plaque noted  throughout with turbulent flow. 50-74% mid SFA stenosis, and 30-49% distal  peroneal stenosis. Collateral flow  noted at proximal ATA. Multiple vessels not well visualized secondary to  patient's constant leg motion and squeezing/trapping of technologist's  hand.   Left: Diffuse calcific plaque noted throughout with turbulent flow. 30-49%  proximal SFA stenosis, 50-74% mid SFA stenosis, and 30-49% stenosis noted  distal ATA and mid PTA. Multiple vessels not well visualized secondary to  patient's constant leg motion  and squeezing/trapping of technologist's hand.    PV intervention 03/14/2022: Rt TP trunk 80% stenosis Rt mid common peroneal artery 95% stenosis Prox occlusion of Rt ATA, Rt PT   Successful orbital atherectomy and PTA 3.0X60 mm balloon to Rt TP trunk and Rt mid common peroneal artery with 0% residual stenosis   Expect improvement in foot circulation and healing post amputation at the level of toe or transmetatarsal amputation. If difficulty healing noted, could then consider revascularization to Rt ATA with retrograde access to Rt DP.   Coronary angiogram 10/20/2018: LM: Normal LAD: Distal 70% diffuse disease.        Ostial 90% stenosis in Diag1. Patent prox Diag 1 stent LCx: Large OM1 with patent prior stent RCA: 100% occluded RPDA        Partially successful PTCA attempt        100%--99%  stenosis        TIMI flow 0-I  LVEDP 16 mmHg  Echocardiogram 10/17/2018: Left ventricle: The cavity size was normal. Wall thickness was increased in a pattern of severe LVH. Systolic function was normal. The estimated ejection fraction was in the range of 50% to 55%. Wall motion was normal; there were no regional wall motion abnormalities. Doppler parameters are consistent with anormal left ventricular relaxation (grade 1 diastolic dysfunction). Doppler parameters are consistent with high ventricular filling pressure. - Left atrium: The atrium was moderately dilated. - Right atrium: The atrium was mildly dilated.   Impressions: - Low normal to mildly reduced LV systolic function; EF 50; severe LVH; mild diastolic dysfunction; biatrial enlargement.  Recent Labs: 06/11/2023: Glucose 234, BUN/Cr 33/4.23. EGFR 14. Na/K 131/3.3. Mg 1.6. Albumin 1.8. Rest of the CMP normal H/H 9.8/30.3. MCV 91. Platelets 286 HbA1C 6.2%  03/19/2022: Glucose 141, BUN/Cr 33/7.22. EGFR 7. Na/K 133/4.0. Albumin 2.5. Phos 4.9. Rest of the CMP normal H/H 9.7/29.6. MCV 91.  Platelets 159 Chol 139, TG 127, HDL 27, LDL 87  01/19/2021: Hb 11.1 HbA1C 6.3% TSH 2.2  Review of Systems  Cardiovascular:  Negative for chest pain, dyspnea on exertion, leg swelling, palpitations and syncope.  Musculoskeletal:        Nonhealing wound Rt heel Lt BKA        Objective:    Vitals:   07/11/23 1453  BP: 135/64  Pulse: 74  Resp: 16  SpO2: 95%    Physical Exam Vitals and nursing note reviewed.  Constitutional:      General: He is not in acute distress. Neck:     Vascular: No JVD.  Cardiovascular:     Rate and Rhythm: Normal rate and regular rhythm.     Pulses:          Dorsalis pedis pulses are 0 on the right side and 0 on the left side.       Posterior tibial pulses are 1+ on the right side and 0 on the left side.     Heart sounds: Normal heart sounds. No murmur heard.    Comments: 2nd toe apmputation.  Healing well Pulmonary:     Effort: Pulmonary effort is normal.     Breath sounds: Normal breath sounds. No wheezing or rales.  Musculoskeletal:     Right lower leg: No edema.     Left lower leg: No edema.     Comments: Nonhealing wound Rt heel Lt BKA        Assessment & Recommendations:   72 y.o. 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  S/p prior 2nd tope amputation and Rt TP trunk PTCA (02/2022). S/p Lt BKA for gangrene foot (05/2023). Recommend urgent LEA duplex and lower extremity angiogram with intention of limb salvage revascularization RLE. Discussed risks. Benefits, alternate options with the patient.  Continue Aspirin 81 mg daily. Continue Lipitor 80 mg daily. Check lipid panel. If LDL remains >70, will add Repatha.   Hypertension: Controlled  Atrial fibrillation: H/o persistent, now in sinus rhythm s/p cardioversion 09/29/2019. CHA2DS2VASc score 4, annual stroke risk 5%. Continue eliquis 5 mg bid.   CAD: CAD s/p prior PCI, NSTEMI in 09/2018, attempted but unsuccessful. PTCA to chronically occluded Rt PDA Stable without angina symptoms.   AS: Moderate. Repeat echocardiogram 05/2024.  F/u in 3 months   Elder Negus, MD Pager: [phone] Office: [phone]

## 2023-07-12 DIAGNOSIS — R110 Nausea: Secondary | ICD-10-CM | POA: Diagnosis not present

## 2023-07-12 DIAGNOSIS — N2581 Secondary hyperparathyroidism of renal origin: Secondary | ICD-10-CM | POA: Diagnosis not present

## 2023-07-12 DIAGNOSIS — D689 Coagulation defect, unspecified: Secondary | ICD-10-CM | POA: Diagnosis not present

## 2023-07-12 DIAGNOSIS — R197 Diarrhea, unspecified: Secondary | ICD-10-CM | POA: Diagnosis not present

## 2023-07-12 DIAGNOSIS — Z992 Dependence on renal dialysis: Secondary | ICD-10-CM | POA: Diagnosis not present

## 2023-07-12 DIAGNOSIS — D509 Iron deficiency anemia, unspecified: Secondary | ICD-10-CM | POA: Diagnosis not present

## 2023-07-12 DIAGNOSIS — D631 Anemia in chronic kidney disease: Secondary | ICD-10-CM | POA: Diagnosis not present

## 2023-07-12 DIAGNOSIS — N186 End stage renal disease: Secondary | ICD-10-CM | POA: Diagnosis not present

## 2023-07-12 DIAGNOSIS — E1129 Type 2 diabetes mellitus with other diabetic kidney complication: Secondary | ICD-10-CM | POA: Diagnosis not present

## 2023-07-12 NOTE — Addendum Note (Signed)
Addended by: Elder Negus on: 07/12/2023 09:13 AM   Modules accepted: Orders

## 2023-07-14 DIAGNOSIS — R197 Diarrhea, unspecified: Secondary | ICD-10-CM | POA: Diagnosis not present

## 2023-07-14 DIAGNOSIS — R531 Weakness: Secondary | ICD-10-CM | POA: Diagnosis not present

## 2023-07-14 DIAGNOSIS — Z79899 Other long term (current) drug therapy: Secondary | ICD-10-CM | POA: Diagnosis not present

## 2023-07-15 DIAGNOSIS — L97411 Non-pressure chronic ulcer of right heel and midfoot limited to breakdown of skin: Secondary | ICD-10-CM | POA: Diagnosis not present

## 2023-07-15 DIAGNOSIS — D689 Coagulation defect, unspecified: Secondary | ICD-10-CM | POA: Diagnosis not present

## 2023-07-15 DIAGNOSIS — R197 Diarrhea, unspecified: Secondary | ICD-10-CM | POA: Diagnosis not present

## 2023-07-15 DIAGNOSIS — L89616 Pressure-induced deep tissue damage of right heel: Secondary | ICD-10-CM | POA: Diagnosis not present

## 2023-07-15 DIAGNOSIS — Z992 Dependence on renal dialysis: Secondary | ICD-10-CM | POA: Diagnosis not present

## 2023-07-15 DIAGNOSIS — R110 Nausea: Secondary | ICD-10-CM | POA: Diagnosis not present

## 2023-07-15 DIAGNOSIS — N2581 Secondary hyperparathyroidism of renal origin: Secondary | ICD-10-CM | POA: Diagnosis not present

## 2023-07-15 DIAGNOSIS — E43 Unspecified severe protein-calorie malnutrition: Secondary | ICD-10-CM | POA: Diagnosis not present

## 2023-07-15 DIAGNOSIS — N186 End stage renal disease: Secondary | ICD-10-CM | POA: Diagnosis not present

## 2023-07-15 DIAGNOSIS — E1129 Type 2 diabetes mellitus with other diabetic kidney complication: Secondary | ICD-10-CM | POA: Diagnosis not present

## 2023-07-15 DIAGNOSIS — E113411 Type 2 diabetes mellitus with severe nonproliferative diabetic retinopathy with macular edema, right eye: Secondary | ICD-10-CM | POA: Diagnosis not present

## 2023-07-15 DIAGNOSIS — D631 Anemia in chronic kidney disease: Secondary | ICD-10-CM | POA: Diagnosis not present

## 2023-07-15 DIAGNOSIS — D509 Iron deficiency anemia, unspecified: Secondary | ICD-10-CM | POA: Diagnosis not present

## 2023-07-17 ENCOUNTER — Other Ambulatory Visit: Payer: Self-pay | Admitting: *Deleted

## 2023-07-17 DIAGNOSIS — D509 Iron deficiency anemia, unspecified: Secondary | ICD-10-CM | POA: Diagnosis not present

## 2023-07-17 DIAGNOSIS — D631 Anemia in chronic kidney disease: Secondary | ICD-10-CM | POA: Diagnosis not present

## 2023-07-17 DIAGNOSIS — E1129 Type 2 diabetes mellitus with other diabetic kidney complication: Secondary | ICD-10-CM | POA: Diagnosis not present

## 2023-07-17 DIAGNOSIS — D689 Coagulation defect, unspecified: Secondary | ICD-10-CM | POA: Diagnosis not present

## 2023-07-17 DIAGNOSIS — N2581 Secondary hyperparathyroidism of renal origin: Secondary | ICD-10-CM | POA: Diagnosis not present

## 2023-07-17 DIAGNOSIS — R110 Nausea: Secondary | ICD-10-CM | POA: Diagnosis not present

## 2023-07-17 DIAGNOSIS — Z992 Dependence on renal dialysis: Secondary | ICD-10-CM | POA: Diagnosis not present

## 2023-07-17 DIAGNOSIS — N186 End stage renal disease: Secondary | ICD-10-CM | POA: Diagnosis not present

## 2023-07-17 DIAGNOSIS — R197 Diarrhea, unspecified: Secondary | ICD-10-CM | POA: Diagnosis not present

## 2023-07-17 NOTE — Patient Outreach (Signed)
William Webb resides in [facility] skilled nursing facility. Screening for potential care coordination/ chronic care management services as a benefit of health plan and primary care provider.  William Webb PCP office- [facility]- has care management team.   [facility] care therapy manager and social worker previously indicated transition plan is to return home. Reported William Webb needs more time with therapy.   Will continue to follow.   William Noble, MSN, RN, BSN [place]  [facility] RN Post- Acute Care Coordinator Direct Dial: [phone]

## 2023-07-18 DIAGNOSIS — R531 Weakness: Secondary | ICD-10-CM | POA: Diagnosis not present

## 2023-07-18 DIAGNOSIS — Z89512 Acquired absence of left leg below knee: Secondary | ICD-10-CM | POA: Diagnosis not present

## 2023-07-18 DIAGNOSIS — Z992 Dependence on renal dialysis: Secondary | ICD-10-CM | POA: Diagnosis not present

## 2023-07-18 DIAGNOSIS — N186 End stage renal disease: Secondary | ICD-10-CM | POA: Diagnosis not present

## 2023-07-18 DIAGNOSIS — R197 Diarrhea, unspecified: Secondary | ICD-10-CM | POA: Diagnosis not present

## 2023-07-19 DIAGNOSIS — D509 Iron deficiency anemia, unspecified: Secondary | ICD-10-CM | POA: Diagnosis not present

## 2023-07-19 DIAGNOSIS — D631 Anemia in chronic kidney disease: Secondary | ICD-10-CM | POA: Diagnosis not present

## 2023-07-19 DIAGNOSIS — Z992 Dependence on renal dialysis: Secondary | ICD-10-CM | POA: Diagnosis not present

## 2023-07-19 DIAGNOSIS — R110 Nausea: Secondary | ICD-10-CM | POA: Diagnosis not present

## 2023-07-19 DIAGNOSIS — R197 Diarrhea, unspecified: Secondary | ICD-10-CM | POA: Diagnosis not present

## 2023-07-19 DIAGNOSIS — D689 Coagulation defect, unspecified: Secondary | ICD-10-CM | POA: Diagnosis not present

## 2023-07-19 DIAGNOSIS — N2581 Secondary hyperparathyroidism of renal origin: Secondary | ICD-10-CM | POA: Diagnosis not present

## 2023-07-19 DIAGNOSIS — E1129 Type 2 diabetes mellitus with other diabetic kidney complication: Secondary | ICD-10-CM | POA: Diagnosis not present

## 2023-07-19 DIAGNOSIS — N186 End stage renal disease: Secondary | ICD-10-CM | POA: Diagnosis not present

## 2023-07-20 DIAGNOSIS — Z992 Dependence on renal dialysis: Secondary | ICD-10-CM | POA: Diagnosis not present

## 2023-07-20 DIAGNOSIS — R531 Weakness: Secondary | ICD-10-CM | POA: Diagnosis not present

## 2023-07-20 DIAGNOSIS — R197 Diarrhea, unspecified: Secondary | ICD-10-CM | POA: Diagnosis not present

## 2023-07-20 DIAGNOSIS — N186 End stage renal disease: Secondary | ICD-10-CM | POA: Diagnosis not present

## 2023-07-22 DIAGNOSIS — R197 Diarrhea, unspecified: Secondary | ICD-10-CM | POA: Diagnosis not present

## 2023-07-22 DIAGNOSIS — D509 Iron deficiency anemia, unspecified: Secondary | ICD-10-CM | POA: Diagnosis not present

## 2023-07-22 DIAGNOSIS — Z992 Dependence on renal dialysis: Secondary | ICD-10-CM | POA: Diagnosis not present

## 2023-07-22 DIAGNOSIS — L97411 Non-pressure chronic ulcer of right heel and midfoot limited to breakdown of skin: Secondary | ICD-10-CM | POA: Diagnosis not present

## 2023-07-22 DIAGNOSIS — E1129 Type 2 diabetes mellitus with other diabetic kidney complication: Secondary | ICD-10-CM | POA: Diagnosis not present

## 2023-07-22 DIAGNOSIS — D631 Anemia in chronic kidney disease: Secondary | ICD-10-CM | POA: Diagnosis not present

## 2023-07-22 DIAGNOSIS — D689 Coagulation defect, unspecified: Secondary | ICD-10-CM | POA: Diagnosis not present

## 2023-07-22 DIAGNOSIS — R110 Nausea: Secondary | ICD-10-CM | POA: Diagnosis not present

## 2023-07-22 DIAGNOSIS — N186 End stage renal disease: Secondary | ICD-10-CM | POA: Diagnosis not present

## 2023-07-22 DIAGNOSIS — N2581 Secondary hyperparathyroidism of renal origin: Secondary | ICD-10-CM | POA: Diagnosis not present

## 2023-07-22 DIAGNOSIS — L89616 Pressure-induced deep tissue damage of right heel: Secondary | ICD-10-CM | POA: Diagnosis not present

## 2023-07-22 DIAGNOSIS — E43 Unspecified severe protein-calorie malnutrition: Secondary | ICD-10-CM | POA: Diagnosis not present

## 2023-07-22 DIAGNOSIS — E113411 Type 2 diabetes mellitus with severe nonproliferative diabetic retinopathy with macular edema, right eye: Secondary | ICD-10-CM | POA: Diagnosis not present

## 2023-07-23 ENCOUNTER — Telehealth: Payer: Self-pay | Admitting: Family

## 2023-07-23 NOTE — Telephone Encounter (Signed)
06/07/2023 left BKA can you please write [facility]

## 2023-07-23 NOTE — Telephone Encounter (Signed)
Patient is at [facility] clinic, he has misplaced his order for the Prosthesis set up. Please fax to [facility] [phone]. Pts callback [phone]

## 2023-07-24 DIAGNOSIS — R197 Diarrhea, unspecified: Secondary | ICD-10-CM | POA: Diagnosis not present

## 2023-07-24 DIAGNOSIS — N2581 Secondary hyperparathyroidism of renal origin: Secondary | ICD-10-CM | POA: Diagnosis not present

## 2023-07-24 DIAGNOSIS — E1129 Type 2 diabetes mellitus with other diabetic kidney complication: Secondary | ICD-10-CM | POA: Diagnosis not present

## 2023-07-24 DIAGNOSIS — R110 Nausea: Secondary | ICD-10-CM | POA: Diagnosis not present

## 2023-07-24 DIAGNOSIS — D509 Iron deficiency anemia, unspecified: Secondary | ICD-10-CM | POA: Diagnosis not present

## 2023-07-24 DIAGNOSIS — Z992 Dependence on renal dialysis: Secondary | ICD-10-CM | POA: Diagnosis not present

## 2023-07-24 DIAGNOSIS — D689 Coagulation defect, unspecified: Secondary | ICD-10-CM | POA: Diagnosis not present

## 2023-07-24 DIAGNOSIS — D631 Anemia in chronic kidney disease: Secondary | ICD-10-CM | POA: Diagnosis not present

## 2023-07-24 DIAGNOSIS — N186 End stage renal disease: Secondary | ICD-10-CM | POA: Diagnosis not present

## 2023-07-26 DIAGNOSIS — D509 Iron deficiency anemia, unspecified: Secondary | ICD-10-CM | POA: Diagnosis not present

## 2023-07-26 DIAGNOSIS — R110 Nausea: Secondary | ICD-10-CM | POA: Diagnosis not present

## 2023-07-26 DIAGNOSIS — D689 Coagulation defect, unspecified: Secondary | ICD-10-CM | POA: Diagnosis not present

## 2023-07-26 DIAGNOSIS — N186 End stage renal disease: Secondary | ICD-10-CM | POA: Diagnosis not present

## 2023-07-26 DIAGNOSIS — R197 Diarrhea, unspecified: Secondary | ICD-10-CM | POA: Diagnosis not present

## 2023-07-26 DIAGNOSIS — Z992 Dependence on renal dialysis: Secondary | ICD-10-CM | POA: Diagnosis not present

## 2023-07-26 DIAGNOSIS — D631 Anemia in chronic kidney disease: Secondary | ICD-10-CM | POA: Diagnosis not present

## 2023-07-26 DIAGNOSIS — N2581 Secondary hyperparathyroidism of renal origin: Secondary | ICD-10-CM | POA: Diagnosis not present

## 2023-07-26 DIAGNOSIS — E1129 Type 2 diabetes mellitus with other diabetic kidney complication: Secondary | ICD-10-CM | POA: Diagnosis not present

## 2023-07-29 DIAGNOSIS — Z992 Dependence on renal dialysis: Secondary | ICD-10-CM | POA: Diagnosis not present

## 2023-07-29 DIAGNOSIS — E1129 Type 2 diabetes mellitus with other diabetic kidney complication: Secondary | ICD-10-CM | POA: Diagnosis not present

## 2023-07-29 DIAGNOSIS — L97411 Non-pressure chronic ulcer of right heel and midfoot limited to breakdown of skin: Secondary | ICD-10-CM | POA: Diagnosis not present

## 2023-07-29 DIAGNOSIS — D509 Iron deficiency anemia, unspecified: Secondary | ICD-10-CM | POA: Diagnosis not present

## 2023-07-29 DIAGNOSIS — E113411 Type 2 diabetes mellitus with severe nonproliferative diabetic retinopathy with macular edema, right eye: Secondary | ICD-10-CM | POA: Diagnosis not present

## 2023-07-29 DIAGNOSIS — D689 Coagulation defect, unspecified: Secondary | ICD-10-CM | POA: Diagnosis not present

## 2023-07-29 DIAGNOSIS — D631 Anemia in chronic kidney disease: Secondary | ICD-10-CM | POA: Diagnosis not present

## 2023-07-29 DIAGNOSIS — R197 Diarrhea, unspecified: Secondary | ICD-10-CM | POA: Diagnosis not present

## 2023-07-29 DIAGNOSIS — E43 Unspecified severe protein-calorie malnutrition: Secondary | ICD-10-CM | POA: Diagnosis not present

## 2023-07-29 DIAGNOSIS — N186 End stage renal disease: Secondary | ICD-10-CM | POA: Diagnosis not present

## 2023-07-29 DIAGNOSIS — N2581 Secondary hyperparathyroidism of renal origin: Secondary | ICD-10-CM | POA: Diagnosis not present

## 2023-07-29 DIAGNOSIS — R110 Nausea: Secondary | ICD-10-CM | POA: Diagnosis not present

## 2023-07-29 DIAGNOSIS — L89616 Pressure-induced deep tissue damage of right heel: Secondary | ICD-10-CM | POA: Diagnosis not present

## 2023-07-31 DIAGNOSIS — Z23 Encounter for immunization: Secondary | ICD-10-CM | POA: Diagnosis not present

## 2023-07-31 DIAGNOSIS — N186 End stage renal disease: Secondary | ICD-10-CM | POA: Diagnosis not present

## 2023-07-31 DIAGNOSIS — N2581 Secondary hyperparathyroidism of renal origin: Secondary | ICD-10-CM | POA: Diagnosis not present

## 2023-07-31 DIAGNOSIS — D631 Anemia in chronic kidney disease: Secondary | ICD-10-CM | POA: Diagnosis not present

## 2023-07-31 DIAGNOSIS — R197 Diarrhea, unspecified: Secondary | ICD-10-CM | POA: Diagnosis not present

## 2023-07-31 DIAGNOSIS — D689 Coagulation defect, unspecified: Secondary | ICD-10-CM | POA: Diagnosis not present

## 2023-07-31 DIAGNOSIS — E1129 Type 2 diabetes mellitus with other diabetic kidney complication: Secondary | ICD-10-CM | POA: Diagnosis not present

## 2023-07-31 DIAGNOSIS — Z992 Dependence on renal dialysis: Secondary | ICD-10-CM | POA: Diagnosis not present

## 2023-07-31 DIAGNOSIS — D509 Iron deficiency anemia, unspecified: Secondary | ICD-10-CM | POA: Diagnosis not present

## 2023-08-02 DIAGNOSIS — R197 Diarrhea, unspecified: Secondary | ICD-10-CM | POA: Diagnosis not present

## 2023-08-02 DIAGNOSIS — Z23 Encounter for immunization: Secondary | ICD-10-CM | POA: Diagnosis not present

## 2023-08-02 DIAGNOSIS — N2581 Secondary hyperparathyroidism of renal origin: Secondary | ICD-10-CM | POA: Diagnosis not present

## 2023-08-02 DIAGNOSIS — D509 Iron deficiency anemia, unspecified: Secondary | ICD-10-CM | POA: Diagnosis not present

## 2023-08-02 DIAGNOSIS — E1129 Type 2 diabetes mellitus with other diabetic kidney complication: Secondary | ICD-10-CM | POA: Diagnosis not present

## 2023-08-02 DIAGNOSIS — D689 Coagulation defect, unspecified: Secondary | ICD-10-CM | POA: Diagnosis not present

## 2023-08-02 DIAGNOSIS — N186 End stage renal disease: Secondary | ICD-10-CM | POA: Diagnosis not present

## 2023-08-02 DIAGNOSIS — Z992 Dependence on renal dialysis: Secondary | ICD-10-CM | POA: Diagnosis not present

## 2023-08-02 DIAGNOSIS — D631 Anemia in chronic kidney disease: Secondary | ICD-10-CM | POA: Diagnosis not present

## 2023-08-04 DIAGNOSIS — R110 Nausea: Secondary | ICD-10-CM | POA: Diagnosis not present

## 2023-08-04 DIAGNOSIS — K219 Gastro-esophageal reflux disease without esophagitis: Secondary | ICD-10-CM | POA: Diagnosis not present

## 2023-08-04 DIAGNOSIS — R531 Weakness: Secondary | ICD-10-CM | POA: Diagnosis not present

## 2023-08-05 DIAGNOSIS — D509 Iron deficiency anemia, unspecified: Secondary | ICD-10-CM | POA: Diagnosis not present

## 2023-08-05 DIAGNOSIS — N186 End stage renal disease: Secondary | ICD-10-CM | POA: Diagnosis not present

## 2023-08-05 DIAGNOSIS — R197 Diarrhea, unspecified: Secondary | ICD-10-CM | POA: Diagnosis not present

## 2023-08-05 DIAGNOSIS — L89616 Pressure-induced deep tissue damage of right heel: Secondary | ICD-10-CM | POA: Diagnosis not present

## 2023-08-05 DIAGNOSIS — E113411 Type 2 diabetes mellitus with severe nonproliferative diabetic retinopathy with macular edema, right eye: Secondary | ICD-10-CM | POA: Diagnosis not present

## 2023-08-05 DIAGNOSIS — D689 Coagulation defect, unspecified: Secondary | ICD-10-CM | POA: Diagnosis not present

## 2023-08-05 DIAGNOSIS — Z992 Dependence on renal dialysis: Secondary | ICD-10-CM | POA: Diagnosis not present

## 2023-08-05 DIAGNOSIS — L97411 Non-pressure chronic ulcer of right heel and midfoot limited to breakdown of skin: Secondary | ICD-10-CM | POA: Diagnosis not present

## 2023-08-05 DIAGNOSIS — Z23 Encounter for immunization: Secondary | ICD-10-CM | POA: Diagnosis not present

## 2023-08-05 DIAGNOSIS — D631 Anemia in chronic kidney disease: Secondary | ICD-10-CM | POA: Diagnosis not present

## 2023-08-05 DIAGNOSIS — N2581 Secondary hyperparathyroidism of renal origin: Secondary | ICD-10-CM | POA: Diagnosis not present

## 2023-08-05 DIAGNOSIS — E1129 Type 2 diabetes mellitus with other diabetic kidney complication: Secondary | ICD-10-CM | POA: Diagnosis not present

## 2023-08-05 DIAGNOSIS — E43 Unspecified severe protein-calorie malnutrition: Secondary | ICD-10-CM | POA: Diagnosis not present

## 2023-08-06 ENCOUNTER — Other Ambulatory Visit: Payer: Self-pay | Admitting: *Deleted

## 2023-08-06 ENCOUNTER — Encounter: Payer: Medicare Other | Admitting: Family

## 2023-08-06 DIAGNOSIS — R197 Diarrhea, unspecified: Secondary | ICD-10-CM | POA: Diagnosis not present

## 2023-08-06 DIAGNOSIS — R110 Nausea: Secondary | ICD-10-CM | POA: Diagnosis not present

## 2023-08-06 DIAGNOSIS — R531 Weakness: Secondary | ICD-10-CM | POA: Diagnosis not present

## 2023-08-06 DIAGNOSIS — I739 Peripheral vascular disease, unspecified: Secondary | ICD-10-CM

## 2023-08-07 DIAGNOSIS — E1129 Type 2 diabetes mellitus with other diabetic kidney complication: Secondary | ICD-10-CM | POA: Diagnosis not present

## 2023-08-07 DIAGNOSIS — N186 End stage renal disease: Secondary | ICD-10-CM | POA: Diagnosis not present

## 2023-08-07 DIAGNOSIS — D631 Anemia in chronic kidney disease: Secondary | ICD-10-CM | POA: Diagnosis not present

## 2023-08-07 DIAGNOSIS — Z23 Encounter for immunization: Secondary | ICD-10-CM | POA: Diagnosis not present

## 2023-08-07 DIAGNOSIS — N2581 Secondary hyperparathyroidism of renal origin: Secondary | ICD-10-CM | POA: Diagnosis not present

## 2023-08-07 DIAGNOSIS — R197 Diarrhea, unspecified: Secondary | ICD-10-CM | POA: Diagnosis not present

## 2023-08-07 DIAGNOSIS — D509 Iron deficiency anemia, unspecified: Secondary | ICD-10-CM | POA: Diagnosis not present

## 2023-08-07 DIAGNOSIS — Z992 Dependence on renal dialysis: Secondary | ICD-10-CM | POA: Diagnosis not present

## 2023-08-07 DIAGNOSIS — D689 Coagulation defect, unspecified: Secondary | ICD-10-CM | POA: Diagnosis not present

## 2023-08-08 DIAGNOSIS — R197 Diarrhea, unspecified: Secondary | ICD-10-CM | POA: Diagnosis not present

## 2023-08-09 DIAGNOSIS — D689 Coagulation defect, unspecified: Secondary | ICD-10-CM | POA: Diagnosis not present

## 2023-08-09 DIAGNOSIS — D509 Iron deficiency anemia, unspecified: Secondary | ICD-10-CM | POA: Diagnosis not present

## 2023-08-09 DIAGNOSIS — E1129 Type 2 diabetes mellitus with other diabetic kidney complication: Secondary | ICD-10-CM | POA: Diagnosis not present

## 2023-08-09 DIAGNOSIS — D631 Anemia in chronic kidney disease: Secondary | ICD-10-CM | POA: Diagnosis not present

## 2023-08-09 DIAGNOSIS — N2581 Secondary hyperparathyroidism of renal origin: Secondary | ICD-10-CM | POA: Diagnosis not present

## 2023-08-09 DIAGNOSIS — N186 End stage renal disease: Secondary | ICD-10-CM | POA: Diagnosis not present

## 2023-08-09 DIAGNOSIS — Z992 Dependence on renal dialysis: Secondary | ICD-10-CM | POA: Diagnosis not present

## 2023-08-09 DIAGNOSIS — Z23 Encounter for immunization: Secondary | ICD-10-CM | POA: Diagnosis not present

## 2023-08-09 DIAGNOSIS — R197 Diarrhea, unspecified: Secondary | ICD-10-CM | POA: Diagnosis not present

## 2023-08-12 DIAGNOSIS — D509 Iron deficiency anemia, unspecified: Secondary | ICD-10-CM | POA: Diagnosis not present

## 2023-08-12 DIAGNOSIS — L89616 Pressure-induced deep tissue damage of right heel: Secondary | ICD-10-CM | POA: Diagnosis not present

## 2023-08-12 DIAGNOSIS — N186 End stage renal disease: Secondary | ICD-10-CM | POA: Diagnosis not present

## 2023-08-12 DIAGNOSIS — R197 Diarrhea, unspecified: Secondary | ICD-10-CM | POA: Diagnosis not present

## 2023-08-12 DIAGNOSIS — Z992 Dependence on renal dialysis: Secondary | ICD-10-CM | POA: Diagnosis not present

## 2023-08-12 DIAGNOSIS — D689 Coagulation defect, unspecified: Secondary | ICD-10-CM | POA: Diagnosis not present

## 2023-08-12 DIAGNOSIS — N2581 Secondary hyperparathyroidism of renal origin: Secondary | ICD-10-CM | POA: Diagnosis not present

## 2023-08-12 DIAGNOSIS — E1129 Type 2 diabetes mellitus with other diabetic kidney complication: Secondary | ICD-10-CM | POA: Diagnosis not present

## 2023-08-12 DIAGNOSIS — Z23 Encounter for immunization: Secondary | ICD-10-CM | POA: Diagnosis not present

## 2023-08-12 DIAGNOSIS — E43 Unspecified severe protein-calorie malnutrition: Secondary | ICD-10-CM | POA: Diagnosis not present

## 2023-08-12 DIAGNOSIS — E113411 Type 2 diabetes mellitus with severe nonproliferative diabetic retinopathy with macular edema, right eye: Secondary | ICD-10-CM | POA: Diagnosis not present

## 2023-08-12 DIAGNOSIS — L97411 Non-pressure chronic ulcer of right heel and midfoot limited to breakdown of skin: Secondary | ICD-10-CM | POA: Diagnosis not present

## 2023-08-12 DIAGNOSIS — D631 Anemia in chronic kidney disease: Secondary | ICD-10-CM | POA: Diagnosis not present

## 2023-08-13 DIAGNOSIS — I96 Gangrene, not elsewhere classified: Secondary | ICD-10-CM | POA: Diagnosis not present

## 2023-08-13 DIAGNOSIS — Z89512 Acquired absence of left leg below knee: Secondary | ICD-10-CM | POA: Diagnosis not present

## 2023-08-13 DIAGNOSIS — R2689 Other abnormalities of gait and mobility: Secondary | ICD-10-CM | POA: Diagnosis not present

## 2023-08-13 DIAGNOSIS — M6281 Muscle weakness (generalized): Secondary | ICD-10-CM | POA: Diagnosis not present

## 2023-08-14 DIAGNOSIS — D689 Coagulation defect, unspecified: Secondary | ICD-10-CM | POA: Diagnosis not present

## 2023-08-14 DIAGNOSIS — E1129 Type 2 diabetes mellitus with other diabetic kidney complication: Secondary | ICD-10-CM | POA: Diagnosis not present

## 2023-08-14 DIAGNOSIS — Z992 Dependence on renal dialysis: Secondary | ICD-10-CM | POA: Diagnosis not present

## 2023-08-14 DIAGNOSIS — R197 Diarrhea, unspecified: Secondary | ICD-10-CM | POA: Diagnosis not present

## 2023-08-14 DIAGNOSIS — D631 Anemia in chronic kidney disease: Secondary | ICD-10-CM | POA: Diagnosis not present

## 2023-08-14 DIAGNOSIS — Z23 Encounter for immunization: Secondary | ICD-10-CM | POA: Diagnosis not present

## 2023-08-14 DIAGNOSIS — N2581 Secondary hyperparathyroidism of renal origin: Secondary | ICD-10-CM | POA: Diagnosis not present

## 2023-08-14 DIAGNOSIS — N186 End stage renal disease: Secondary | ICD-10-CM | POA: Diagnosis not present

## 2023-08-14 DIAGNOSIS — D509 Iron deficiency anemia, unspecified: Secondary | ICD-10-CM | POA: Diagnosis not present
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
## 2023-08-15 DIAGNOSIS — I700 Atherosclerosis of aorta: Secondary | ICD-10-CM | POA: Diagnosis not present

## 2023-08-15 DIAGNOSIS — L97411 Non-pressure chronic ulcer of right heel and midfoot limited to breakdown of skin: Secondary | ICD-10-CM | POA: Diagnosis not present

## 2023-08-15 DIAGNOSIS — I2510 Atherosclerotic heart disease of native coronary artery without angina pectoris: Secondary | ICD-10-CM | POA: Diagnosis not present

## 2023-08-15 DIAGNOSIS — I480 Paroxysmal atrial fibrillation: Secondary | ICD-10-CM | POA: Diagnosis not present

## 2023-08-15 DIAGNOSIS — N186 End stage renal disease: Secondary | ICD-10-CM | POA: Diagnosis not present

## 2023-08-15 DIAGNOSIS — E785 Hyperlipidemia, unspecified: Secondary | ICD-10-CM | POA: Diagnosis not present

## 2023-08-15 DIAGNOSIS — Z4781 Encounter for orthopedic aftercare following surgical amputation: Secondary | ICD-10-CM | POA: Diagnosis not present

## 2023-08-15 DIAGNOSIS — H35033 Hypertensive retinopathy, bilateral: Secondary | ICD-10-CM | POA: Diagnosis not present

## 2023-08-15 DIAGNOSIS — I132 Hypertensive heart and chronic kidney disease with heart failure and with stage 5 chronic kidney disease, or end stage renal disease: Secondary | ICD-10-CM | POA: Diagnosis not present

## 2023-08-15 DIAGNOSIS — J302 Other seasonal allergic rhinitis: Secondary | ICD-10-CM | POA: Diagnosis not present

## 2023-08-15 DIAGNOSIS — E1122 Type 2 diabetes mellitus with diabetic chronic kidney disease: Secondary | ICD-10-CM | POA: Diagnosis not present

## 2023-08-15 DIAGNOSIS — Z89512 Acquired absence of left leg below knee: Secondary | ICD-10-CM | POA: Diagnosis not present

## 2023-08-15 DIAGNOSIS — E1151 Type 2 diabetes mellitus with diabetic peripheral angiopathy without gangrene: Secondary | ICD-10-CM | POA: Diagnosis not present

## 2023-08-15 DIAGNOSIS — I5032 Chronic diastolic (congestive) heart failure: Secondary | ICD-10-CM | POA: Diagnosis not present

## 2023-08-15 DIAGNOSIS — M159 Polyosteoarthritis, unspecified: Secondary | ICD-10-CM | POA: Diagnosis not present

## 2023-08-15 DIAGNOSIS — E11621 Type 2 diabetes mellitus with foot ulcer: Secondary | ICD-10-CM | POA: Diagnosis not present

## 2023-08-15 DIAGNOSIS — Z87891 Personal history of nicotine dependence: Secondary | ICD-10-CM | POA: Diagnosis not present

## 2023-08-15 DIAGNOSIS — E113411 Type 2 diabetes mellitus with severe nonproliferative diabetic retinopathy with macular edema, right eye: Secondary | ICD-10-CM | POA: Diagnosis not present

## 2023-08-15 DIAGNOSIS — M797 Fibromyalgia: Secondary | ICD-10-CM | POA: Diagnosis not present

## 2023-08-15 DIAGNOSIS — M810 Age-related osteoporosis without current pathological fracture: Secondary | ICD-10-CM | POA: Diagnosis not present

## 2023-08-15 DIAGNOSIS — E46 Unspecified protein-calorie malnutrition: Secondary | ICD-10-CM | POA: Diagnosis not present

## 2023-08-16 ENCOUNTER — Encounter: Payer: Medicare Other | Admitting: Vascular Surgery

## 2023-08-16 ENCOUNTER — Ambulatory Visit (HOSPITAL_COMMUNITY): Payer: Medicare Other

## 2023-08-16 DIAGNOSIS — R197 Diarrhea, unspecified: Secondary | ICD-10-CM | POA: Diagnosis not present

## 2023-08-16 DIAGNOSIS — D631 Anemia in chronic kidney disease: Secondary | ICD-10-CM | POA: Diagnosis not present

## 2023-08-16 DIAGNOSIS — N2581 Secondary hyperparathyroidism of renal origin: Secondary | ICD-10-CM | POA: Diagnosis not present

## 2023-08-16 DIAGNOSIS — D509 Iron deficiency anemia, unspecified: Secondary | ICD-10-CM | POA: Diagnosis not present

## 2023-08-16 DIAGNOSIS — Z23 Encounter for immunization: Secondary | ICD-10-CM | POA: Diagnosis not present

## 2023-08-16 DIAGNOSIS — Z992 Dependence on renal dialysis: Secondary | ICD-10-CM | POA: Diagnosis not present

## 2023-08-16 DIAGNOSIS — E1129 Type 2 diabetes mellitus with other diabetic kidney complication: Secondary | ICD-10-CM | POA: Diagnosis not present

## 2023-08-16 DIAGNOSIS — N186 End stage renal disease: Secondary | ICD-10-CM | POA: Diagnosis not present

## 2023-08-16 DIAGNOSIS — D689 Coagulation defect, unspecified: Secondary | ICD-10-CM | POA: Diagnosis not present

## 2023-08-19 DIAGNOSIS — R197 Diarrhea, unspecified: Secondary | ICD-10-CM | POA: Diagnosis not present

## 2023-08-19 DIAGNOSIS — N2581 Secondary hyperparathyroidism of renal origin: Secondary | ICD-10-CM | POA: Diagnosis not present

## 2023-08-19 DIAGNOSIS — D689 Coagulation defect, unspecified: Secondary | ICD-10-CM | POA: Diagnosis not present

## 2023-08-19 DIAGNOSIS — Z992 Dependence on renal dialysis: Secondary | ICD-10-CM | POA: Diagnosis not present

## 2023-08-19 DIAGNOSIS — Z23 Encounter for immunization: Secondary | ICD-10-CM | POA: Diagnosis not present

## 2023-08-19 DIAGNOSIS — D509 Iron deficiency anemia, unspecified: Secondary | ICD-10-CM | POA: Diagnosis not present

## 2023-08-19 DIAGNOSIS — D631 Anemia in chronic kidney disease: Secondary | ICD-10-CM | POA: Diagnosis not present

## 2023-08-19 DIAGNOSIS — E1129 Type 2 diabetes mellitus with other diabetic kidney complication: Secondary | ICD-10-CM | POA: Diagnosis not present

## 2023-08-19 DIAGNOSIS — N186 End stage renal disease: Secondary | ICD-10-CM | POA: Diagnosis not present

## 2023-08-20 ENCOUNTER — Ambulatory Visit (HOSPITAL_COMMUNITY): Payer: Medicare Other | Attending: Vascular Surgery

## 2023-08-20 DIAGNOSIS — Z89512 Acquired absence of left leg below knee: Secondary | ICD-10-CM | POA: Diagnosis not present

## 2023-08-20 DIAGNOSIS — N186 End stage renal disease: Secondary | ICD-10-CM | POA: Diagnosis not present

## 2023-08-20 DIAGNOSIS — E46 Unspecified protein-calorie malnutrition: Secondary | ICD-10-CM | POA: Diagnosis not present

## 2023-08-20 DIAGNOSIS — I700 Atherosclerosis of aorta: Secondary | ICD-10-CM | POA: Diagnosis not present

## 2023-08-20 DIAGNOSIS — E785 Hyperlipidemia, unspecified: Secondary | ICD-10-CM | POA: Diagnosis not present

## 2023-08-20 DIAGNOSIS — I480 Paroxysmal atrial fibrillation: Secondary | ICD-10-CM | POA: Diagnosis not present

## 2023-08-20 DIAGNOSIS — M810 Age-related osteoporosis without current pathological fracture: Secondary | ICD-10-CM | POA: Diagnosis not present

## 2023-08-20 DIAGNOSIS — M797 Fibromyalgia: Secondary | ICD-10-CM | POA: Diagnosis not present

## 2023-08-20 DIAGNOSIS — J302 Other seasonal allergic rhinitis: Secondary | ICD-10-CM | POA: Diagnosis not present

## 2023-08-20 DIAGNOSIS — Z87891 Personal history of nicotine dependence: Secondary | ICD-10-CM | POA: Diagnosis not present

## 2023-08-20 DIAGNOSIS — I132 Hypertensive heart and chronic kidney disease with heart failure and with stage 5 chronic kidney disease, or end stage renal disease: Secondary | ICD-10-CM | POA: Diagnosis not present

## 2023-08-20 DIAGNOSIS — M159 Polyosteoarthritis, unspecified: Secondary | ICD-10-CM | POA: Diagnosis not present

## 2023-08-20 DIAGNOSIS — H35033 Hypertensive retinopathy, bilateral: Secondary | ICD-10-CM | POA: Diagnosis not present

## 2023-08-20 DIAGNOSIS — E113411 Type 2 diabetes mellitus with severe nonproliferative diabetic retinopathy with macular edema, right eye: Secondary | ICD-10-CM | POA: Diagnosis not present

## 2023-08-20 DIAGNOSIS — E1151 Type 2 diabetes mellitus with diabetic peripheral angiopathy without gangrene: Secondary | ICD-10-CM | POA: Diagnosis not present

## 2023-08-20 DIAGNOSIS — I2510 Atherosclerotic heart disease of native coronary artery without angina pectoris: Secondary | ICD-10-CM | POA: Diagnosis not present

## 2023-08-20 DIAGNOSIS — Z4781 Encounter for orthopedic aftercare following surgical amputation: Secondary | ICD-10-CM | POA: Diagnosis not present

## 2023-08-20 DIAGNOSIS — E11621 Type 2 diabetes mellitus with foot ulcer: Secondary | ICD-10-CM | POA: Diagnosis not present

## 2023-08-20 DIAGNOSIS — I5032 Chronic diastolic (congestive) heart failure: Secondary | ICD-10-CM | POA: Diagnosis not present

## 2023-08-20 DIAGNOSIS — L97411 Non-pressure chronic ulcer of right heel and midfoot limited to breakdown of skin: Secondary | ICD-10-CM | POA: Diagnosis not present

## 2023-08-20 DIAGNOSIS — E1122 Type 2 diabetes mellitus with diabetic chronic kidney disease: Secondary | ICD-10-CM | POA: Diagnosis not present

## 2023-08-21 DIAGNOSIS — N186 End stage renal disease: Secondary | ICD-10-CM | POA: Diagnosis not present

## 2023-08-21 DIAGNOSIS — E1129 Type 2 diabetes mellitus with other diabetic kidney complication: Secondary | ICD-10-CM | POA: Diagnosis not present

## 2023-08-21 DIAGNOSIS — D631 Anemia in chronic kidney disease: Secondary | ICD-10-CM | POA: Diagnosis not present

## 2023-08-21 DIAGNOSIS — R197 Diarrhea, unspecified: Secondary | ICD-10-CM | POA: Diagnosis not present

## 2023-08-21 DIAGNOSIS — N2581 Secondary hyperparathyroidism of renal origin: Secondary | ICD-10-CM | POA: Diagnosis not present

## 2023-08-21 DIAGNOSIS — Z23 Encounter for immunization: Secondary | ICD-10-CM | POA: Diagnosis not present

## 2023-08-21 DIAGNOSIS — Z992 Dependence on renal dialysis: Secondary | ICD-10-CM | POA: Diagnosis not present

## 2023-08-21 DIAGNOSIS — D509 Iron deficiency anemia, unspecified: Secondary | ICD-10-CM | POA: Diagnosis not present

## 2023-08-21 DIAGNOSIS — D689 Coagulation defect, unspecified: Secondary | ICD-10-CM | POA: Diagnosis not present

## 2023-08-22 ENCOUNTER — Other Ambulatory Visit: Payer: Self-pay

## 2023-08-22 ENCOUNTER — Ambulatory Visit
Admission: EM | Admit: 2023-08-22 | Discharge: 2023-08-22 | Disposition: A | Discharge status: Home / Self Care | Payer: Medicare Other | Attending: Family Medicine | Admitting: Family Medicine

## 2023-08-22 ENCOUNTER — Encounter: Payer: Self-pay | Admitting: *Deleted

## 2023-08-22 ENCOUNTER — Telehealth: Payer: Self-pay

## 2023-08-22 DIAGNOSIS — I10 Essential (primary) hypertension: Secondary | ICD-10-CM | POA: Diagnosis not present

## 2023-08-22 DIAGNOSIS — E11621 Type 2 diabetes mellitus with foot ulcer: Secondary | ICD-10-CM | POA: Diagnosis not present

## 2023-08-22 DIAGNOSIS — J302 Other seasonal allergic rhinitis: Secondary | ICD-10-CM | POA: Diagnosis not present

## 2023-08-22 DIAGNOSIS — Z9229 Personal history of other drug therapy: Secondary | ICD-10-CM

## 2023-08-22 DIAGNOSIS — H35033 Hypertensive retinopathy, bilateral: Secondary | ICD-10-CM | POA: Diagnosis not present

## 2023-08-22 DIAGNOSIS — M810 Age-related osteoporosis without current pathological fracture: Secondary | ICD-10-CM | POA: Diagnosis not present

## 2023-08-22 DIAGNOSIS — E113411 Type 2 diabetes mellitus with severe nonproliferative diabetic retinopathy with macular edema, right eye: Secondary | ICD-10-CM | POA: Diagnosis not present

## 2023-08-22 DIAGNOSIS — N186 End stage renal disease: Secondary | ICD-10-CM | POA: Diagnosis not present

## 2023-08-22 DIAGNOSIS — E785 Hyperlipidemia, unspecified: Secondary | ICD-10-CM | POA: Diagnosis not present

## 2023-08-22 DIAGNOSIS — M159 Polyosteoarthritis, unspecified: Secondary | ICD-10-CM | POA: Diagnosis not present

## 2023-08-22 DIAGNOSIS — L97411 Non-pressure chronic ulcer of right heel and midfoot limited to breakdown of skin: Secondary | ICD-10-CM | POA: Diagnosis not present

## 2023-08-22 DIAGNOSIS — R040 Epistaxis: Secondary | ICD-10-CM | POA: Diagnosis not present

## 2023-08-22 DIAGNOSIS — E1151 Type 2 diabetes mellitus with diabetic peripheral angiopathy without gangrene: Secondary | ICD-10-CM | POA: Diagnosis not present

## 2023-08-22 DIAGNOSIS — Z89512 Acquired absence of left leg below knee: Secondary | ICD-10-CM | POA: Diagnosis not present

## 2023-08-22 DIAGNOSIS — I132 Hypertensive heart and chronic kidney disease with heart failure and with stage 5 chronic kidney disease, or end stage renal disease: Secondary | ICD-10-CM | POA: Diagnosis not present

## 2023-08-22 DIAGNOSIS — I480 Paroxysmal atrial fibrillation: Secondary | ICD-10-CM | POA: Diagnosis not present

## 2023-08-22 DIAGNOSIS — M797 Fibromyalgia: Secondary | ICD-10-CM | POA: Diagnosis not present

## 2023-08-22 DIAGNOSIS — I2510 Atherosclerotic heart disease of native coronary artery without angina pectoris: Secondary | ICD-10-CM | POA: Diagnosis not present

## 2023-08-22 DIAGNOSIS — I700 Atherosclerosis of aorta: Secondary | ICD-10-CM | POA: Diagnosis not present

## 2023-08-22 DIAGNOSIS — E1122 Type 2 diabetes mellitus with diabetic chronic kidney disease: Secondary | ICD-10-CM | POA: Diagnosis not present

## 2023-08-22 DIAGNOSIS — Z4781 Encounter for orthopedic aftercare following surgical amputation: Secondary | ICD-10-CM | POA: Diagnosis not present

## 2023-08-22 DIAGNOSIS — I5032 Chronic diastolic (congestive) heart failure: Secondary | ICD-10-CM | POA: Diagnosis not present

## 2023-08-22 DIAGNOSIS — Z87891 Personal history of nicotine dependence: Secondary | ICD-10-CM | POA: Diagnosis not present

## 2023-08-22 DIAGNOSIS — E46 Unspecified protein-calorie malnutrition: Secondary | ICD-10-CM | POA: Diagnosis not present

## 2023-08-22 MED ORDER — OXYMETAZOLINE HCL 0.05 % NA SOLN
1.0000 | Freq: Two times a day (BID) | NASAL | Status: DC
  Administered 2023-08-22: 1 via NASAL

## 2023-08-22 NOTE — Telephone Encounter (Signed)
Patient called triage. Dr.Duda performed a left BKA on 06/07/2023. Patient needs a form signed for transportation services that he cannot ambulate well, and would need to be picked up by the door of his home.He will drop form off at front desk, unless you feel he needs to be seen for this particular form. [phone]

## 2023-08-22 NOTE — ED Triage Notes (Addendum)
Pt reports he was "digging in his nose" while sleepy. Has had nosebleed since 230pm. States he has pulled large clots out of his nose. Still bleeding at present. Pt takes Eliquis. He had dialysis yesterday
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
## 2023-08-22 NOTE — Discharge Instructions (Addendum)
Your blood pressure was noted to be elevated during your visit today. If you are currently taking medication for high blood pressure, please ensure you are taking this as directed. If you do not have a history of high blood pressure and your blood pressure remains persistently elevated, you may need to begin taking a medication at some point. You may return here within the next few days to recheck if unable to see your primary care provider or if you do not have a one.  BP (!) 183/80 (BP Location: Right Arm)   Pulse 75   Temp 98.4 F (36.9 C) (Oral)   Resp 18   SpO2 97%   BP Readings from Last 3 Encounters:  08/22/23 (!) 183/80  07/11/23 135/64  06/11/23 (!) 94/54

## 2023-08-23 DIAGNOSIS — I480 Paroxysmal atrial fibrillation: Secondary | ICD-10-CM | POA: Diagnosis not present

## 2023-08-23 DIAGNOSIS — N2581 Secondary hyperparathyroidism of renal origin: Secondary | ICD-10-CM | POA: Diagnosis not present

## 2023-08-23 DIAGNOSIS — E46 Unspecified protein-calorie malnutrition: Secondary | ICD-10-CM | POA: Diagnosis not present

## 2023-08-23 DIAGNOSIS — Z992 Dependence on renal dialysis: Secondary | ICD-10-CM | POA: Diagnosis not present

## 2023-08-23 DIAGNOSIS — Z87891 Personal history of nicotine dependence: Secondary | ICD-10-CM | POA: Diagnosis not present

## 2023-08-23 DIAGNOSIS — H35033 Hypertensive retinopathy, bilateral: Secondary | ICD-10-CM | POA: Diagnosis not present

## 2023-08-23 DIAGNOSIS — J302 Other seasonal allergic rhinitis: Secondary | ICD-10-CM | POA: Diagnosis not present

## 2023-08-23 DIAGNOSIS — M810 Age-related osteoporosis without current pathological fracture: Secondary | ICD-10-CM | POA: Diagnosis not present

## 2023-08-23 DIAGNOSIS — E785 Hyperlipidemia, unspecified: Secondary | ICD-10-CM | POA: Diagnosis not present

## 2023-08-23 DIAGNOSIS — L97411 Non-pressure chronic ulcer of right heel and midfoot limited to breakdown of skin: Secondary | ICD-10-CM | POA: Diagnosis not present

## 2023-08-23 DIAGNOSIS — Z4781 Encounter for orthopedic aftercare following surgical amputation: Secondary | ICD-10-CM | POA: Diagnosis not present

## 2023-08-23 DIAGNOSIS — I132 Hypertensive heart and chronic kidney disease with heart failure and with stage 5 chronic kidney disease, or end stage renal disease: Secondary | ICD-10-CM | POA: Diagnosis not present

## 2023-08-23 DIAGNOSIS — R197 Diarrhea, unspecified: Secondary | ICD-10-CM | POA: Diagnosis not present

## 2023-08-23 DIAGNOSIS — D689 Coagulation defect, unspecified: Secondary | ICD-10-CM | POA: Diagnosis not present

## 2023-08-23 DIAGNOSIS — E1151 Type 2 diabetes mellitus with diabetic peripheral angiopathy without gangrene: Secondary | ICD-10-CM | POA: Diagnosis not present

## 2023-08-23 DIAGNOSIS — E1122 Type 2 diabetes mellitus with diabetic chronic kidney disease: Secondary | ICD-10-CM | POA: Diagnosis not present

## 2023-08-23 DIAGNOSIS — M797 Fibromyalgia: Secondary | ICD-10-CM | POA: Diagnosis not present

## 2023-08-23 DIAGNOSIS — I2510 Atherosclerotic heart disease of native coronary artery without angina pectoris: Secondary | ICD-10-CM | POA: Diagnosis not present

## 2023-08-23 DIAGNOSIS — I700 Atherosclerosis of aorta: Secondary | ICD-10-CM | POA: Diagnosis not present

## 2023-08-23 DIAGNOSIS — E1129 Type 2 diabetes mellitus with other diabetic kidney complication: Secondary | ICD-10-CM | POA: Diagnosis not present

## 2023-08-23 DIAGNOSIS — Z89512 Acquired absence of left leg below knee: Secondary | ICD-10-CM | POA: Diagnosis not present

## 2023-08-23 DIAGNOSIS — D631 Anemia in chronic kidney disease: Secondary | ICD-10-CM | POA: Diagnosis not present

## 2023-08-23 DIAGNOSIS — Z23 Encounter for immunization: Secondary | ICD-10-CM | POA: Diagnosis not present

## 2023-08-23 DIAGNOSIS — I5032 Chronic diastolic (congestive) heart failure: Secondary | ICD-10-CM | POA: Diagnosis not present

## 2023-08-23 DIAGNOSIS — M159 Polyosteoarthritis, unspecified: Secondary | ICD-10-CM | POA: Diagnosis not present

## 2023-08-23 DIAGNOSIS — E113411 Type 2 diabetes mellitus with severe nonproliferative diabetic retinopathy with macular edema, right eye: Secondary | ICD-10-CM | POA: Diagnosis not present

## 2023-08-23 DIAGNOSIS — N186 End stage renal disease: Secondary | ICD-10-CM | POA: Diagnosis not present

## 2023-08-23 DIAGNOSIS — E11621 Type 2 diabetes mellitus with foot ulcer: Secondary | ICD-10-CM | POA: Diagnosis not present

## 2023-08-23 DIAGNOSIS — D509 Iron deficiency anemia, unspecified: Secondary | ICD-10-CM | POA: Diagnosis not present

## 2023-08-23 NOTE — Telephone Encounter (Signed)
No problem, Patient was seen in September. Shouldn't be an issue doing paperwork for him.

## 2023-08-26 DIAGNOSIS — Z992 Dependence on renal dialysis: Secondary | ICD-10-CM | POA: Diagnosis not present

## 2023-08-26 DIAGNOSIS — N2581 Secondary hyperparathyroidism of renal origin: Secondary | ICD-10-CM | POA: Diagnosis not present

## 2023-08-26 DIAGNOSIS — Z23 Encounter for immunization: Secondary | ICD-10-CM | POA: Diagnosis not present

## 2023-08-26 DIAGNOSIS — R197 Diarrhea, unspecified: Secondary | ICD-10-CM | POA: Diagnosis not present

## 2023-08-26 DIAGNOSIS — N186 End stage renal disease: Secondary | ICD-10-CM | POA: Diagnosis not present

## 2023-08-26 DIAGNOSIS — D509 Iron deficiency anemia, unspecified: Secondary | ICD-10-CM | POA: Diagnosis not present

## 2023-08-26 DIAGNOSIS — D631 Anemia in chronic kidney disease: Secondary | ICD-10-CM | POA: Diagnosis not present

## 2023-08-26 DIAGNOSIS — D689 Coagulation defect, unspecified: Secondary | ICD-10-CM | POA: Diagnosis not present

## 2023-08-26 DIAGNOSIS — E1129 Type 2 diabetes mellitus with other diabetic kidney complication: Secondary | ICD-10-CM | POA: Diagnosis not present

## 2023-08-27 DIAGNOSIS — M810 Age-related osteoporosis without current pathological fracture: Secondary | ICD-10-CM | POA: Diagnosis not present

## 2023-08-27 DIAGNOSIS — Z4781 Encounter for orthopedic aftercare following surgical amputation: Secondary | ICD-10-CM | POA: Diagnosis not present

## 2023-08-27 DIAGNOSIS — E1122 Type 2 diabetes mellitus with diabetic chronic kidney disease: Secondary | ICD-10-CM | POA: Diagnosis not present

## 2023-08-27 DIAGNOSIS — E113411 Type 2 diabetes mellitus with severe nonproliferative diabetic retinopathy with macular edema, right eye: Secondary | ICD-10-CM | POA: Diagnosis not present

## 2023-08-27 DIAGNOSIS — M159 Polyosteoarthritis, unspecified: Secondary | ICD-10-CM | POA: Diagnosis not present

## 2023-08-27 DIAGNOSIS — E1140 Type 2 diabetes mellitus with diabetic neuropathy, unspecified: Secondary | ICD-10-CM | POA: Diagnosis not present

## 2023-08-27 DIAGNOSIS — Z89512 Acquired absence of left leg below knee: Secondary | ICD-10-CM | POA: Diagnosis not present

## 2023-08-27 DIAGNOSIS — I132 Hypertensive heart and chronic kidney disease with heart failure and with stage 5 chronic kidney disease, or end stage renal disease: Secondary | ICD-10-CM | POA: Diagnosis not present

## 2023-08-27 DIAGNOSIS — I2510 Atherosclerotic heart disease of native coronary artery without angina pectoris: Secondary | ICD-10-CM | POA: Diagnosis not present

## 2023-08-27 DIAGNOSIS — E1151 Type 2 diabetes mellitus with diabetic peripheral angiopathy without gangrene: Secondary | ICD-10-CM | POA: Diagnosis not present

## 2023-08-27 DIAGNOSIS — I5032 Chronic diastolic (congestive) heart failure: Secondary | ICD-10-CM | POA: Diagnosis not present

## 2023-08-27 DIAGNOSIS — M797 Fibromyalgia: Secondary | ICD-10-CM | POA: Diagnosis not present

## 2023-08-27 DIAGNOSIS — E46 Unspecified protein-calorie malnutrition: Secondary | ICD-10-CM | POA: Diagnosis not present

## 2023-08-27 DIAGNOSIS — E11621 Type 2 diabetes mellitus with foot ulcer: Secondary | ICD-10-CM | POA: Diagnosis not present

## 2023-08-27 DIAGNOSIS — J302 Other seasonal allergic rhinitis: Secondary | ICD-10-CM | POA: Diagnosis not present

## 2023-08-27 DIAGNOSIS — Z8551 Personal history of malignant neoplasm of bladder: Secondary | ICD-10-CM | POA: Diagnosis not present

## 2023-08-27 DIAGNOSIS — D6869 Other thrombophilia: Secondary | ICD-10-CM | POA: Diagnosis not present

## 2023-08-27 DIAGNOSIS — E7800 Pure hypercholesterolemia, unspecified: Secondary | ICD-10-CM | POA: Diagnosis not present

## 2023-08-27 DIAGNOSIS — I480 Paroxysmal atrial fibrillation: Secondary | ICD-10-CM | POA: Diagnosis not present

## 2023-08-27 DIAGNOSIS — H35033 Hypertensive retinopathy, bilateral: Secondary | ICD-10-CM | POA: Diagnosis not present

## 2023-08-27 DIAGNOSIS — I120 Hypertensive chronic kidney disease with stage 5 chronic kidney disease or end stage renal disease: Secondary | ICD-10-CM | POA: Diagnosis not present

## 2023-08-27 DIAGNOSIS — Z87891 Personal history of nicotine dependence: Secondary | ICD-10-CM | POA: Diagnosis not present

## 2023-08-27 DIAGNOSIS — N186 End stage renal disease: Secondary | ICD-10-CM | POA: Diagnosis not present

## 2023-08-27 DIAGNOSIS — L97411 Non-pressure chronic ulcer of right heel and midfoot limited to breakdown of skin: Secondary | ICD-10-CM | POA: Diagnosis not present

## 2023-08-27 DIAGNOSIS — I700 Atherosclerosis of aorta: Secondary | ICD-10-CM | POA: Diagnosis not present

## 2023-08-27 DIAGNOSIS — E785 Hyperlipidemia, unspecified: Secondary | ICD-10-CM | POA: Diagnosis not present

## 2023-08-28 DIAGNOSIS — D631 Anemia in chronic kidney disease: Secondary | ICD-10-CM | POA: Diagnosis not present

## 2023-08-28 DIAGNOSIS — N2581 Secondary hyperparathyroidism of renal origin: Secondary | ICD-10-CM | POA: Diagnosis not present

## 2023-08-28 DIAGNOSIS — D509 Iron deficiency anemia, unspecified: Secondary | ICD-10-CM | POA: Diagnosis not present

## 2023-08-28 DIAGNOSIS — Z23 Encounter for immunization: Secondary | ICD-10-CM | POA: Diagnosis not present

## 2023-08-28 DIAGNOSIS — N186 End stage renal disease: Secondary | ICD-10-CM | POA: Diagnosis not present

## 2023-08-28 DIAGNOSIS — Z992 Dependence on renal dialysis: Secondary | ICD-10-CM | POA: Diagnosis not present

## 2023-08-28 DIAGNOSIS — E1129 Type 2 diabetes mellitus with other diabetic kidney complication: Secondary | ICD-10-CM | POA: Diagnosis not present

## 2023-08-28 DIAGNOSIS — D689 Coagulation defect, unspecified: Secondary | ICD-10-CM | POA: Diagnosis not present

## 2023-08-28 DIAGNOSIS — R197 Diarrhea, unspecified: Secondary | ICD-10-CM | POA: Diagnosis not present

## 2023-08-29 ENCOUNTER — Ambulatory Visit (INDEPENDENT_AMBULATORY_CARE_PROVIDER_SITE_OTHER): Payer: Medicare Other | Admitting: Podiatry

## 2023-08-29 ENCOUNTER — Encounter: Payer: Self-pay | Admitting: Podiatry

## 2023-08-29 VITALS — Ht 75.0 in | Wt 158.7 lb

## 2023-08-29 DIAGNOSIS — E1151 Type 2 diabetes mellitus with diabetic peripheral angiopathy without gangrene: Secondary | ICD-10-CM | POA: Diagnosis not present

## 2023-08-29 DIAGNOSIS — Z87891 Personal history of nicotine dependence: Secondary | ICD-10-CM | POA: Diagnosis not present

## 2023-08-29 DIAGNOSIS — E11622 Type 2 diabetes mellitus with other skin ulcer: Secondary | ICD-10-CM | POA: Diagnosis not present

## 2023-08-29 DIAGNOSIS — N186 End stage renal disease: Secondary | ICD-10-CM | POA: Diagnosis not present

## 2023-08-29 DIAGNOSIS — J302 Other seasonal allergic rhinitis: Secondary | ICD-10-CM | POA: Diagnosis not present

## 2023-08-29 DIAGNOSIS — M797 Fibromyalgia: Secondary | ICD-10-CM | POA: Diagnosis not present

## 2023-08-29 DIAGNOSIS — Z89512 Acquired absence of left leg below knee: Secondary | ICD-10-CM | POA: Diagnosis not present

## 2023-08-29 DIAGNOSIS — M159 Polyosteoarthritis, unspecified: Secondary | ICD-10-CM | POA: Diagnosis not present

## 2023-08-29 DIAGNOSIS — I700 Atherosclerosis of aorta: Secondary | ICD-10-CM | POA: Diagnosis not present

## 2023-08-29 DIAGNOSIS — E1129 Type 2 diabetes mellitus with other diabetic kidney complication: Secondary | ICD-10-CM | POA: Diagnosis not present

## 2023-08-29 DIAGNOSIS — E113411 Type 2 diabetes mellitus with severe nonproliferative diabetic retinopathy with macular edema, right eye: Secondary | ICD-10-CM | POA: Diagnosis not present

## 2023-08-29 DIAGNOSIS — M810 Age-related osteoporosis without current pathological fracture: Secondary | ICD-10-CM | POA: Diagnosis not present

## 2023-08-29 DIAGNOSIS — L97411 Non-pressure chronic ulcer of right heel and midfoot limited to breakdown of skin: Secondary | ICD-10-CM | POA: Diagnosis not present

## 2023-08-29 DIAGNOSIS — I5032 Chronic diastolic (congestive) heart failure: Secondary | ICD-10-CM | POA: Diagnosis not present

## 2023-08-29 DIAGNOSIS — E11621 Type 2 diabetes mellitus with foot ulcer: Secondary | ICD-10-CM | POA: Diagnosis not present

## 2023-08-29 DIAGNOSIS — L97309 Non-pressure chronic ulcer of unspecified ankle with unspecified severity: Secondary | ICD-10-CM | POA: Diagnosis not present

## 2023-08-29 DIAGNOSIS — Z992 Dependence on renal dialysis: Secondary | ICD-10-CM | POA: Diagnosis not present

## 2023-08-29 DIAGNOSIS — E1122 Type 2 diabetes mellitus with diabetic chronic kidney disease: Secondary | ICD-10-CM | POA: Diagnosis not present

## 2023-08-29 DIAGNOSIS — I132 Hypertensive heart and chronic kidney disease with heart failure and with stage 5 chronic kidney disease, or end stage renal disease: Secondary | ICD-10-CM | POA: Diagnosis not present

## 2023-08-29 DIAGNOSIS — E46 Unspecified protein-calorie malnutrition: Secondary | ICD-10-CM | POA: Diagnosis not present

## 2023-08-29 DIAGNOSIS — I2510 Atherosclerotic heart disease of native coronary artery without angina pectoris: Secondary | ICD-10-CM | POA: Diagnosis not present

## 2023-08-29 DIAGNOSIS — Z4781 Encounter for orthopedic aftercare following surgical amputation: Secondary | ICD-10-CM | POA: Diagnosis not present

## 2023-08-29 DIAGNOSIS — I480 Paroxysmal atrial fibrillation: Secondary | ICD-10-CM | POA: Diagnosis not present

## 2023-08-29 DIAGNOSIS — E785 Hyperlipidemia, unspecified: Secondary | ICD-10-CM | POA: Diagnosis not present

## 2023-08-29 DIAGNOSIS — H35033 Hypertensive retinopathy, bilateral: Secondary | ICD-10-CM | POA: Diagnosis not present

## 2023-08-29 NOTE — Progress Notes (Signed)
Subjective:   Patient ID: William Webb, male   DOB: 72 y.o.   MRN: [id number]   HPI Patient presents with a sore on the right heel stating he is having 1 it was doing well and now there is 1 spot is just bleeding starting today.  Patient did lose his left leg 3 months ago and is currently seeing vascular doctor for possible revascularization right   ROS      Objective:  Physical Exam  Neuro vascular status [facility] with significant loss of circulatory status right lower leg with a area of fresh bleeding on the plantar posterior aspect of the right heel localized with the area that we had worked on doing well     Assessment:  Possibility for trauma to this area with patient being seen by vascular at this time     Plan:  Went ahead today and flushed the area and then applied dressing to stop bleeding and he will see his vascular doctor and hopefully this will heal uneventfully gave instructions for soaks offloading.  Reappoint if any issues were to occur

## 2023-08-30 DIAGNOSIS — E1129 Type 2 diabetes mellitus with other diabetic kidney complication: Secondary | ICD-10-CM | POA: Diagnosis not present

## 2023-08-30 DIAGNOSIS — D631 Anemia in chronic kidney disease: Secondary | ICD-10-CM | POA: Diagnosis not present

## 2023-08-30 DIAGNOSIS — R197 Diarrhea, unspecified: Secondary | ICD-10-CM | POA: Diagnosis not present

## 2023-08-30 DIAGNOSIS — N186 End stage renal disease: Secondary | ICD-10-CM | POA: Diagnosis not present

## 2023-08-30 DIAGNOSIS — D509 Iron deficiency anemia, unspecified: Secondary | ICD-10-CM | POA: Diagnosis not present

## 2023-08-30 DIAGNOSIS — N2581 Secondary hyperparathyroidism of renal origin: Secondary | ICD-10-CM | POA: Diagnosis not present

## 2023-08-30 DIAGNOSIS — Z992 Dependence on renal dialysis: Secondary | ICD-10-CM | POA: Diagnosis not present

## 2023-08-30 DIAGNOSIS — D689 Coagulation defect, unspecified: Secondary | ICD-10-CM | POA: Diagnosis not present

## 2023-09-02 DIAGNOSIS — N186 End stage renal disease: Secondary | ICD-10-CM | POA: Diagnosis not present

## 2023-09-02 DIAGNOSIS — D631 Anemia in chronic kidney disease: Secondary | ICD-10-CM | POA: Diagnosis not present

## 2023-09-02 DIAGNOSIS — E1129 Type 2 diabetes mellitus with other diabetic kidney complication: Secondary | ICD-10-CM | POA: Diagnosis not present

## 2023-09-02 DIAGNOSIS — Z992 Dependence on renal dialysis: Secondary | ICD-10-CM | POA: Diagnosis not present

## 2023-09-02 DIAGNOSIS — R197 Diarrhea, unspecified: Secondary | ICD-10-CM | POA: Diagnosis not present

## 2023-09-02 DIAGNOSIS — D509 Iron deficiency anemia, unspecified: Secondary | ICD-10-CM | POA: Diagnosis not present

## 2023-09-02 DIAGNOSIS — N2581 Secondary hyperparathyroidism of renal origin: Secondary | ICD-10-CM | POA: Diagnosis not present

## 2023-09-02 DIAGNOSIS — D689 Coagulation defect, unspecified: Secondary | ICD-10-CM | POA: Diagnosis not present

## 2023-09-03 DIAGNOSIS — E113411 Type 2 diabetes mellitus with severe nonproliferative diabetic retinopathy with macular edema, right eye: Secondary | ICD-10-CM | POA: Diagnosis not present

## 2023-09-03 DIAGNOSIS — Z89512 Acquired absence of left leg below knee: Secondary | ICD-10-CM | POA: Diagnosis not present

## 2023-09-03 DIAGNOSIS — E11621 Type 2 diabetes mellitus with foot ulcer: Secondary | ICD-10-CM | POA: Diagnosis not present

## 2023-09-03 DIAGNOSIS — I2510 Atherosclerotic heart disease of native coronary artery without angina pectoris: Secondary | ICD-10-CM | POA: Diagnosis not present

## 2023-09-03 DIAGNOSIS — N186 End stage renal disease: Secondary | ICD-10-CM | POA: Diagnosis not present

## 2023-09-03 DIAGNOSIS — I132 Hypertensive heart and chronic kidney disease with heart failure and with stage 5 chronic kidney disease, or end stage renal disease: Secondary | ICD-10-CM | POA: Diagnosis not present

## 2023-09-03 DIAGNOSIS — Z4781 Encounter for orthopedic aftercare following surgical amputation: Secondary | ICD-10-CM | POA: Diagnosis not present

## 2023-09-03 DIAGNOSIS — M810 Age-related osteoporosis without current pathological fracture: Secondary | ICD-10-CM | POA: Diagnosis not present

## 2023-09-03 DIAGNOSIS — I5032 Chronic diastolic (congestive) heart failure: Secondary | ICD-10-CM | POA: Diagnosis not present

## 2023-09-03 DIAGNOSIS — E46 Unspecified protein-calorie malnutrition: Secondary | ICD-10-CM | POA: Diagnosis not present

## 2023-09-03 DIAGNOSIS — E1151 Type 2 diabetes mellitus with diabetic peripheral angiopathy without gangrene: Secondary | ICD-10-CM | POA: Diagnosis not present

## 2023-09-03 DIAGNOSIS — J302 Other seasonal allergic rhinitis: Secondary | ICD-10-CM | POA: Diagnosis not present

## 2023-09-03 DIAGNOSIS — I700 Atherosclerosis of aorta: Secondary | ICD-10-CM | POA: Diagnosis not present

## 2023-09-03 DIAGNOSIS — M797 Fibromyalgia: Secondary | ICD-10-CM | POA: Diagnosis not present

## 2023-09-03 DIAGNOSIS — E1122 Type 2 diabetes mellitus with diabetic chronic kidney disease: Secondary | ICD-10-CM | POA: Diagnosis not present

## 2023-09-03 DIAGNOSIS — I480 Paroxysmal atrial fibrillation: Secondary | ICD-10-CM | POA: Diagnosis not present

## 2023-09-03 DIAGNOSIS — E785 Hyperlipidemia, unspecified: Secondary | ICD-10-CM | POA: Diagnosis not present

## 2023-09-03 DIAGNOSIS — H35033 Hypertensive retinopathy, bilateral: Secondary | ICD-10-CM | POA: Diagnosis not present

## 2023-09-03 DIAGNOSIS — M159 Polyosteoarthritis, unspecified: Secondary | ICD-10-CM | POA: Diagnosis not present

## 2023-09-03 DIAGNOSIS — L97411 Non-pressure chronic ulcer of right heel and midfoot limited to breakdown of skin: Secondary | ICD-10-CM | POA: Diagnosis not present

## 2023-09-03 DIAGNOSIS — Z87891 Personal history of nicotine dependence: Secondary | ICD-10-CM | POA: Diagnosis not present

## 2023-09-04 ENCOUNTER — Telehealth: Payer: Self-pay | Admitting: Cardiology

## 2023-09-04 DIAGNOSIS — E46 Unspecified protein-calorie malnutrition: Secondary | ICD-10-CM | POA: Diagnosis not present

## 2023-09-04 DIAGNOSIS — E11621 Type 2 diabetes mellitus with foot ulcer: Secondary | ICD-10-CM | POA: Diagnosis not present

## 2023-09-04 DIAGNOSIS — N186 End stage renal disease: Secondary | ICD-10-CM | POA: Diagnosis not present

## 2023-09-04 DIAGNOSIS — M159 Polyosteoarthritis, unspecified: Secondary | ICD-10-CM | POA: Diagnosis not present

## 2023-09-04 DIAGNOSIS — Z87891 Personal history of nicotine dependence: Secondary | ICD-10-CM | POA: Diagnosis not present

## 2023-09-04 DIAGNOSIS — I132 Hypertensive heart and chronic kidney disease with heart failure and with stage 5 chronic kidney disease, or end stage renal disease: Secondary | ICD-10-CM | POA: Diagnosis not present

## 2023-09-04 DIAGNOSIS — D509 Iron deficiency anemia, unspecified: Secondary | ICD-10-CM | POA: Diagnosis not present

## 2023-09-04 DIAGNOSIS — I2510 Atherosclerotic heart disease of native coronary artery without angina pectoris: Secondary | ICD-10-CM | POA: Diagnosis not present

## 2023-09-04 DIAGNOSIS — D631 Anemia in chronic kidney disease: Secondary | ICD-10-CM | POA: Diagnosis not present

## 2023-09-04 DIAGNOSIS — J302 Other seasonal allergic rhinitis: Secondary | ICD-10-CM | POA: Diagnosis not present

## 2023-09-04 DIAGNOSIS — M797 Fibromyalgia: Secondary | ICD-10-CM | POA: Diagnosis not present

## 2023-09-04 DIAGNOSIS — Z4781 Encounter for orthopedic aftercare following surgical amputation: Secondary | ICD-10-CM | POA: Diagnosis not present

## 2023-09-04 DIAGNOSIS — I700 Atherosclerosis of aorta: Secondary | ICD-10-CM | POA: Diagnosis not present

## 2023-09-04 DIAGNOSIS — E113411 Type 2 diabetes mellitus with severe nonproliferative diabetic retinopathy with macular edema, right eye: Secondary | ICD-10-CM | POA: Diagnosis not present

## 2023-09-04 DIAGNOSIS — E1151 Type 2 diabetes mellitus with diabetic peripheral angiopathy without gangrene: Secondary | ICD-10-CM | POA: Diagnosis not present

## 2023-09-04 DIAGNOSIS — E785 Hyperlipidemia, unspecified: Secondary | ICD-10-CM | POA: Diagnosis not present

## 2023-09-04 DIAGNOSIS — L97411 Non-pressure chronic ulcer of right heel and midfoot limited to breakdown of skin: Secondary | ICD-10-CM | POA: Diagnosis not present

## 2023-09-04 DIAGNOSIS — E1122 Type 2 diabetes mellitus with diabetic chronic kidney disease: Secondary | ICD-10-CM | POA: Diagnosis not present

## 2023-09-04 DIAGNOSIS — R197 Diarrhea, unspecified: Secondary | ICD-10-CM | POA: Diagnosis not present

## 2023-09-04 DIAGNOSIS — I5032 Chronic diastolic (congestive) heart failure: Secondary | ICD-10-CM | POA: Diagnosis not present

## 2023-09-04 DIAGNOSIS — Z89512 Acquired absence of left leg below knee: Secondary | ICD-10-CM | POA: Diagnosis not present

## 2023-09-04 DIAGNOSIS — N2581 Secondary hyperparathyroidism of renal origin: Secondary | ICD-10-CM | POA: Diagnosis not present

## 2023-09-04 DIAGNOSIS — I480 Paroxysmal atrial fibrillation: Secondary | ICD-10-CM | POA: Diagnosis not present

## 2023-09-04 DIAGNOSIS — H35033 Hypertensive retinopathy, bilateral: Secondary | ICD-10-CM | POA: Diagnosis not present

## 2023-09-04 DIAGNOSIS — D689 Coagulation defect, unspecified: Secondary | ICD-10-CM | POA: Diagnosis not present

## 2023-09-04 DIAGNOSIS — Z992 Dependence on renal dialysis: Secondary | ICD-10-CM | POA: Diagnosis not present

## 2023-09-04 DIAGNOSIS — M810 Age-related osteoporosis without current pathological fracture: Secondary | ICD-10-CM | POA: Diagnosis not present

## 2023-09-04 DIAGNOSIS — E1129 Type 2 diabetes mellitus with other diabetic kidney complication: Secondary | ICD-10-CM | POA: Diagnosis not present

## 2023-09-04 NOTE — Telephone Encounter (Signed)
Spoke with daughter per DPR and she wanted to know when was patient supposed to follow up with Dr. Rosemary Holms. OV notes from September states to follow up in 3 months. She verbalized understanding.

## 2023-09-04 NOTE — Telephone Encounter (Signed)
Daughter wanted to speak to the nurse regarding patient up coming procedure. Please advise

## 2023-09-05 ENCOUNTER — Encounter: Payer: Self-pay | Admitting: Podiatry

## 2023-09-05 ENCOUNTER — Ambulatory Visit: Payer: Medicare Other | Admitting: Podiatry

## 2023-09-05 VITALS — Ht 75.0 in | Wt 158.7 lb

## 2023-09-05 DIAGNOSIS — E11622 Type 2 diabetes mellitus with other skin ulcer: Secondary | ICD-10-CM | POA: Diagnosis not present

## 2023-09-05 DIAGNOSIS — Z89512 Acquired absence of left leg below knee: Secondary | ICD-10-CM | POA: Diagnosis not present

## 2023-09-05 DIAGNOSIS — M797 Fibromyalgia: Secondary | ICD-10-CM | POA: Diagnosis not present

## 2023-09-05 DIAGNOSIS — E113411 Type 2 diabetes mellitus with severe nonproliferative diabetic retinopathy with macular edema, right eye: Secondary | ICD-10-CM | POA: Diagnosis not present

## 2023-09-05 DIAGNOSIS — L97512 Non-pressure chronic ulcer of other part of right foot with fat layer exposed: Secondary | ICD-10-CM

## 2023-09-05 DIAGNOSIS — E1151 Type 2 diabetes mellitus with diabetic peripheral angiopathy without gangrene: Secondary | ICD-10-CM | POA: Diagnosis not present

## 2023-09-05 DIAGNOSIS — H35033 Hypertensive retinopathy, bilateral: Secondary | ICD-10-CM | POA: Diagnosis not present

## 2023-09-05 DIAGNOSIS — J302 Other seasonal allergic rhinitis: Secondary | ICD-10-CM | POA: Diagnosis not present

## 2023-09-05 DIAGNOSIS — E785 Hyperlipidemia, unspecified: Secondary | ICD-10-CM | POA: Diagnosis not present

## 2023-09-05 DIAGNOSIS — L97411 Non-pressure chronic ulcer of right heel and midfoot limited to breakdown of skin: Secondary | ICD-10-CM | POA: Diagnosis not present

## 2023-09-05 DIAGNOSIS — I132 Hypertensive heart and chronic kidney disease with heart failure and with stage 5 chronic kidney disease, or end stage renal disease: Secondary | ICD-10-CM | POA: Diagnosis not present

## 2023-09-05 DIAGNOSIS — L97309 Non-pressure chronic ulcer of unspecified ankle with unspecified severity: Secondary | ICD-10-CM | POA: Diagnosis not present

## 2023-09-05 DIAGNOSIS — I5032 Chronic diastolic (congestive) heart failure: Secondary | ICD-10-CM | POA: Diagnosis not present

## 2023-09-05 DIAGNOSIS — E1122 Type 2 diabetes mellitus with diabetic chronic kidney disease: Secondary | ICD-10-CM | POA: Diagnosis not present

## 2023-09-05 DIAGNOSIS — E11621 Type 2 diabetes mellitus with foot ulcer: Secondary | ICD-10-CM

## 2023-09-05 DIAGNOSIS — I2510 Atherosclerotic heart disease of native coronary artery without angina pectoris: Secondary | ICD-10-CM | POA: Diagnosis not present

## 2023-09-05 DIAGNOSIS — I700 Atherosclerosis of aorta: Secondary | ICD-10-CM | POA: Diagnosis not present

## 2023-09-05 DIAGNOSIS — M159 Polyosteoarthritis, unspecified: Secondary | ICD-10-CM | POA: Diagnosis not present

## 2023-09-05 DIAGNOSIS — M810 Age-related osteoporosis without current pathological fracture: Secondary | ICD-10-CM | POA: Diagnosis not present

## 2023-09-05 DIAGNOSIS — E46 Unspecified protein-calorie malnutrition: Secondary | ICD-10-CM | POA: Diagnosis not present

## 2023-09-05 DIAGNOSIS — N186 End stage renal disease: Secondary | ICD-10-CM | POA: Diagnosis not present

## 2023-09-05 DIAGNOSIS — I480 Paroxysmal atrial fibrillation: Secondary | ICD-10-CM | POA: Diagnosis not present

## 2023-09-05 DIAGNOSIS — Z4781 Encounter for orthopedic aftercare following surgical amputation: Secondary | ICD-10-CM | POA: Diagnosis not present

## 2023-09-05 DIAGNOSIS — Z87891 Personal history of nicotine dependence: Secondary | ICD-10-CM | POA: Diagnosis not present

## 2023-09-05 NOTE — Progress Notes (Signed)
Subjective:   Patient ID: William Webb, male   DOB: 72 y.o.   MRN: [id number]   HPI Patient presents stating he was doing well and then started to bleed again on the bottom of his right heel and is concerned about possibility of problems with this and admits that his wound care nurse took the bandage off and started doing this    ROS      Objective:  Physical Exam  Neurovascular status unchanged patient is in a wheelchair has lost the left leg has a area of crusted tissue localized bleeding right plantar heel no proximal edema edema no drainage no subcutaneous tendon bone exposure     Assessment:  Trauma to the plantar right heel that was doing well but most likely the scab was torn off allowing this to bleed and ulcerate     Plan:  H&P flushed the area out debrided a small amount of tissue that inspected and at this point applied dressing to stop bleeding with Silvadene sterile.  Patient will be seen back to recheck strict instructions of any other significant issues were to occur to let us know right away

## 2023-09-06 DIAGNOSIS — R197 Diarrhea, unspecified: Secondary | ICD-10-CM | POA: Diagnosis not present

## 2023-09-06 DIAGNOSIS — D509 Iron deficiency anemia, unspecified: Secondary | ICD-10-CM | POA: Diagnosis not present

## 2023-09-06 DIAGNOSIS — D631 Anemia in chronic kidney disease: Secondary | ICD-10-CM | POA: Diagnosis not present

## 2023-09-06 DIAGNOSIS — D689 Coagulation defect, unspecified: Secondary | ICD-10-CM | POA: Diagnosis not present

## 2023-09-06 DIAGNOSIS — Z992 Dependence on renal dialysis: Secondary | ICD-10-CM | POA: Diagnosis not present

## 2023-09-06 DIAGNOSIS — N2581 Secondary hyperparathyroidism of renal origin: Secondary | ICD-10-CM | POA: Diagnosis not present

## 2023-09-06 DIAGNOSIS — E1129 Type 2 diabetes mellitus with other diabetic kidney complication: Secondary | ICD-10-CM | POA: Diagnosis not present

## 2023-09-06 DIAGNOSIS — N186 End stage renal disease: Secondary | ICD-10-CM | POA: Diagnosis not present

## 2023-09-09 DIAGNOSIS — E1129 Type 2 diabetes mellitus with other diabetic kidney complication: Secondary | ICD-10-CM | POA: Diagnosis not present

## 2023-09-09 DIAGNOSIS — R197 Diarrhea, unspecified: Secondary | ICD-10-CM | POA: Diagnosis not present

## 2023-09-09 DIAGNOSIS — N2581 Secondary hyperparathyroidism of renal origin: Secondary | ICD-10-CM | POA: Diagnosis not present

## 2023-09-09 DIAGNOSIS — D509 Iron deficiency anemia, unspecified: Secondary | ICD-10-CM | POA: Diagnosis not present

## 2023-09-09 DIAGNOSIS — N186 End stage renal disease: Secondary | ICD-10-CM | POA: Diagnosis not present

## 2023-09-09 DIAGNOSIS — D689 Coagulation defect, unspecified: Secondary | ICD-10-CM | POA: Diagnosis not present

## 2023-09-09 DIAGNOSIS — D631 Anemia in chronic kidney disease: Secondary | ICD-10-CM | POA: Diagnosis not present

## 2023-09-09 DIAGNOSIS — Z992 Dependence on renal dialysis: Secondary | ICD-10-CM | POA: Diagnosis not present
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
## 2023-09-10 ENCOUNTER — Ambulatory Visit (INDEPENDENT_AMBULATORY_CARE_PROVIDER_SITE_OTHER): Payer: Medicare Other | Admitting: Vascular Surgery

## 2023-09-10 ENCOUNTER — Encounter: Payer: Self-pay | Admitting: Vascular Surgery

## 2023-09-10 VITALS — BP 183/90 | HR 70 | Temp 98.3°F | Resp 20 | Ht 75.0 in | Wt 158.0 lb

## 2023-09-10 DIAGNOSIS — H35033 Hypertensive retinopathy, bilateral: Secondary | ICD-10-CM | POA: Diagnosis not present

## 2023-09-10 DIAGNOSIS — I739 Peripheral vascular disease, unspecified: Secondary | ICD-10-CM | POA: Diagnosis not present

## 2023-09-10 DIAGNOSIS — I700 Atherosclerosis of aorta: Secondary | ICD-10-CM | POA: Diagnosis not present

## 2023-09-10 DIAGNOSIS — N186 End stage renal disease: Secondary | ICD-10-CM | POA: Diagnosis not present

## 2023-09-10 DIAGNOSIS — M159 Polyosteoarthritis, unspecified: Secondary | ICD-10-CM | POA: Diagnosis not present

## 2023-09-10 DIAGNOSIS — Z4781 Encounter for orthopedic aftercare following surgical amputation: Secondary | ICD-10-CM | POA: Diagnosis not present

## 2023-09-10 DIAGNOSIS — I132 Hypertensive heart and chronic kidney disease with heart failure and with stage 5 chronic kidney disease, or end stage renal disease: Secondary | ICD-10-CM | POA: Diagnosis not present

## 2023-09-10 DIAGNOSIS — J302 Other seasonal allergic rhinitis: Secondary | ICD-10-CM | POA: Diagnosis not present

## 2023-09-10 DIAGNOSIS — M810 Age-related osteoporosis without current pathological fracture: Secondary | ICD-10-CM | POA: Diagnosis not present

## 2023-09-10 DIAGNOSIS — E113411 Type 2 diabetes mellitus with severe nonproliferative diabetic retinopathy with macular edema, right eye: Secondary | ICD-10-CM | POA: Diagnosis not present

## 2023-09-10 DIAGNOSIS — E1151 Type 2 diabetes mellitus with diabetic peripheral angiopathy without gangrene: Secondary | ICD-10-CM | POA: Diagnosis not present

## 2023-09-10 DIAGNOSIS — L97411 Non-pressure chronic ulcer of right heel and midfoot limited to breakdown of skin: Secondary | ICD-10-CM | POA: Diagnosis not present

## 2023-09-10 DIAGNOSIS — I5032 Chronic diastolic (congestive) heart failure: Secondary | ICD-10-CM | POA: Diagnosis not present

## 2023-09-10 DIAGNOSIS — E785 Hyperlipidemia, unspecified: Secondary | ICD-10-CM | POA: Diagnosis not present

## 2023-09-10 DIAGNOSIS — I480 Paroxysmal atrial fibrillation: Secondary | ICD-10-CM | POA: Diagnosis not present

## 2023-09-10 DIAGNOSIS — Z87891 Personal history of nicotine dependence: Secondary | ICD-10-CM | POA: Diagnosis not present

## 2023-09-10 DIAGNOSIS — M797 Fibromyalgia: Secondary | ICD-10-CM | POA: Diagnosis not present

## 2023-09-10 DIAGNOSIS — Z89512 Acquired absence of left leg below knee: Secondary | ICD-10-CM | POA: Diagnosis not present

## 2023-09-10 DIAGNOSIS — E11621 Type 2 diabetes mellitus with foot ulcer: Secondary | ICD-10-CM | POA: Diagnosis not present

## 2023-09-10 DIAGNOSIS — E1122 Type 2 diabetes mellitus with diabetic chronic kidney disease: Secondary | ICD-10-CM | POA: Diagnosis not present

## 2023-09-10 DIAGNOSIS — I2510 Atherosclerotic heart disease of native coronary artery without angina pectoris: Secondary | ICD-10-CM | POA: Diagnosis not present

## 2023-09-10 DIAGNOSIS — E46 Unspecified protein-calorie malnutrition: Secondary | ICD-10-CM | POA: Diagnosis not present

## 2023-09-11 ENCOUNTER — Other Ambulatory Visit: Payer: Self-pay | Admitting: *Deleted

## 2023-09-11 DIAGNOSIS — I700 Atherosclerosis of aorta: Secondary | ICD-10-CM | POA: Diagnosis not present

## 2023-09-11 DIAGNOSIS — D631 Anemia in chronic kidney disease: Secondary | ICD-10-CM | POA: Diagnosis not present

## 2023-09-11 DIAGNOSIS — J302 Other seasonal allergic rhinitis: Secondary | ICD-10-CM | POA: Diagnosis not present

## 2023-09-11 DIAGNOSIS — E113411 Type 2 diabetes mellitus with severe nonproliferative diabetic retinopathy with macular edema, right eye: Secondary | ICD-10-CM | POA: Diagnosis not present

## 2023-09-11 DIAGNOSIS — D689 Coagulation defect, unspecified: Secondary | ICD-10-CM | POA: Diagnosis not present

## 2023-09-11 DIAGNOSIS — L97411 Non-pressure chronic ulcer of right heel and midfoot limited to breakdown of skin: Secondary | ICD-10-CM | POA: Diagnosis not present

## 2023-09-11 DIAGNOSIS — I5032 Chronic diastolic (congestive) heart failure: Secondary | ICD-10-CM | POA: Diagnosis not present

## 2023-09-11 DIAGNOSIS — H35033 Hypertensive retinopathy, bilateral: Secondary | ICD-10-CM | POA: Diagnosis not present

## 2023-09-11 DIAGNOSIS — M797 Fibromyalgia: Secondary | ICD-10-CM | POA: Diagnosis not present

## 2023-09-11 DIAGNOSIS — E785 Hyperlipidemia, unspecified: Secondary | ICD-10-CM | POA: Diagnosis not present

## 2023-09-11 DIAGNOSIS — I2510 Atherosclerotic heart disease of native coronary artery without angina pectoris: Secondary | ICD-10-CM | POA: Diagnosis not present

## 2023-09-11 DIAGNOSIS — I739 Peripheral vascular disease, unspecified: Secondary | ICD-10-CM

## 2023-09-11 DIAGNOSIS — Z87891 Personal history of nicotine dependence: Secondary | ICD-10-CM | POA: Diagnosis not present

## 2023-09-11 DIAGNOSIS — M159 Polyosteoarthritis, unspecified: Secondary | ICD-10-CM | POA: Diagnosis not present

## 2023-09-11 DIAGNOSIS — I480 Paroxysmal atrial fibrillation: Secondary | ICD-10-CM | POA: Diagnosis not present

## 2023-09-11 DIAGNOSIS — M810 Age-related osteoporosis without current pathological fracture: Secondary | ICD-10-CM | POA: Diagnosis not present

## 2023-09-11 DIAGNOSIS — Z89512 Acquired absence of left leg below knee: Secondary | ICD-10-CM | POA: Diagnosis not present

## 2023-09-11 DIAGNOSIS — E1129 Type 2 diabetes mellitus with other diabetic kidney complication: Secondary | ICD-10-CM | POA: Diagnosis not present

## 2023-09-11 DIAGNOSIS — E11621 Type 2 diabetes mellitus with foot ulcer: Secondary | ICD-10-CM | POA: Diagnosis not present

## 2023-09-11 DIAGNOSIS — N2581 Secondary hyperparathyroidism of renal origin: Secondary | ICD-10-CM | POA: Diagnosis not present

## 2023-09-11 DIAGNOSIS — D509 Iron deficiency anemia, unspecified: Secondary | ICD-10-CM | POA: Diagnosis not present

## 2023-09-11 DIAGNOSIS — E46 Unspecified protein-calorie malnutrition: Secondary | ICD-10-CM | POA: Diagnosis not present

## 2023-09-11 DIAGNOSIS — N186 End stage renal disease: Secondary | ICD-10-CM | POA: Diagnosis not present

## 2023-09-11 DIAGNOSIS — R197 Diarrhea, unspecified: Secondary | ICD-10-CM | POA: Diagnosis not present

## 2023-09-11 DIAGNOSIS — E1151 Type 2 diabetes mellitus with diabetic peripheral angiopathy without gangrene: Secondary | ICD-10-CM | POA: Diagnosis not present

## 2023-09-11 DIAGNOSIS — Z992 Dependence on renal dialysis: Secondary | ICD-10-CM | POA: Diagnosis not present

## 2023-09-11 DIAGNOSIS — Z4781 Encounter for orthopedic aftercare following surgical amputation: Secondary | ICD-10-CM | POA: Diagnosis not present

## 2023-09-11 DIAGNOSIS — E1122 Type 2 diabetes mellitus with diabetic chronic kidney disease: Secondary | ICD-10-CM | POA: Diagnosis not present

## 2023-09-11 DIAGNOSIS — I132 Hypertensive heart and chronic kidney disease with heart failure and with stage 5 chronic kidney disease, or end stage renal disease: Secondary | ICD-10-CM | POA: Diagnosis not present

## 2023-09-12 ENCOUNTER — Ambulatory Visit (INDEPENDENT_AMBULATORY_CARE_PROVIDER_SITE_OTHER)
Admission: RE | Admit: 2023-09-12 | Discharge: 2023-09-12 | Disposition: A | Discharge status: Home / Self Care | Payer: Medicare Other | Source: Ambulatory Visit | Attending: Vascular Surgery | Admitting: Vascular Surgery

## 2023-09-12 ENCOUNTER — Ambulatory Visit (HOSPITAL_COMMUNITY)
Admission: RE | Admit: 2023-09-12 | Discharge: 2023-09-12 | Disposition: A | Discharge status: Home / Self Care | Payer: Medicare Other | Source: Ambulatory Visit | Attending: Vascular Surgery | Admitting: Vascular Surgery

## 2023-09-12 ENCOUNTER — Ambulatory Visit: Payer: Medicare Other | Admitting: Orthopedic Surgery

## 2023-09-12 DIAGNOSIS — I739 Peripheral vascular disease, unspecified: Secondary | ICD-10-CM | POA: Diagnosis not present

## 2023-09-12 LAB — VAS US ABI WITH/WO TBI: Right ABI: 1.1

## 2023-09-13 DIAGNOSIS — Z992 Dependence on renal dialysis: Secondary | ICD-10-CM | POA: Diagnosis not present

## 2023-09-13 DIAGNOSIS — N2581 Secondary hyperparathyroidism of renal origin: Secondary | ICD-10-CM | POA: Diagnosis not present

## 2023-09-13 DIAGNOSIS — D509 Iron deficiency anemia, unspecified: Secondary | ICD-10-CM | POA: Diagnosis not present

## 2023-09-13 DIAGNOSIS — D631 Anemia in chronic kidney disease: Secondary | ICD-10-CM | POA: Diagnosis not present

## 2023-09-13 DIAGNOSIS — E1129 Type 2 diabetes mellitus with other diabetic kidney complication: Secondary | ICD-10-CM | POA: Diagnosis not present

## 2023-09-13 DIAGNOSIS — N186 End stage renal disease: Secondary | ICD-10-CM | POA: Diagnosis not present

## 2023-09-13 DIAGNOSIS — R197 Diarrhea, unspecified: Secondary | ICD-10-CM | POA: Diagnosis not present

## 2023-09-13 DIAGNOSIS — D689 Coagulation defect, unspecified: Secondary | ICD-10-CM | POA: Diagnosis not present

## 2023-09-16 ENCOUNTER — Other Ambulatory Visit: Payer: Self-pay

## 2023-09-16 DIAGNOSIS — I70234 Atherosclerosis of native arteries of right leg with ulceration of heel and midfoot: Secondary | ICD-10-CM

## 2023-09-16 DIAGNOSIS — D631 Anemia in chronic kidney disease: Secondary | ICD-10-CM | POA: Diagnosis not present

## 2023-09-16 DIAGNOSIS — D689 Coagulation defect, unspecified: Secondary | ICD-10-CM | POA: Diagnosis not present

## 2023-09-16 DIAGNOSIS — N186 End stage renal disease: Secondary | ICD-10-CM | POA: Diagnosis not present

## 2023-09-16 DIAGNOSIS — R197 Diarrhea, unspecified: Secondary | ICD-10-CM | POA: Diagnosis not present

## 2023-09-16 DIAGNOSIS — D509 Iron deficiency anemia, unspecified: Secondary | ICD-10-CM | POA: Diagnosis not present

## 2023-09-16 DIAGNOSIS — E1129 Type 2 diabetes mellitus with other diabetic kidney complication: Secondary | ICD-10-CM | POA: Diagnosis not present

## 2023-09-16 DIAGNOSIS — N2581 Secondary hyperparathyroidism of renal origin: Secondary | ICD-10-CM | POA: Diagnosis not present

## 2023-09-16 DIAGNOSIS — Z992 Dependence on renal dialysis: Secondary | ICD-10-CM | POA: Diagnosis not present

## 2023-09-16 MED ORDER — SODIUM CHLORIDE 0.9 % IV SOLN: 250.0000 mL | INTRAVENOUS | Status: AC | PRN

## 2023-09-17 ENCOUNTER — Ambulatory Visit: Payer: Medicare Other | Admitting: Orthopedic Surgery

## 2023-09-17 DIAGNOSIS — Z89512 Acquired absence of left leg below knee: Secondary | ICD-10-CM | POA: Diagnosis not present

## 2023-09-17 DIAGNOSIS — L89611 Pressure ulcer of right heel, stage 1: Secondary | ICD-10-CM

## 2023-09-17 DIAGNOSIS — I739 Peripheral vascular disease, unspecified: Secondary | ICD-10-CM | POA: Diagnosis not present

## 2023-09-18 DIAGNOSIS — Z992 Dependence on renal dialysis: Secondary | ICD-10-CM | POA: Diagnosis not present

## 2023-09-18 DIAGNOSIS — D689 Coagulation defect, unspecified: Secondary | ICD-10-CM | POA: Diagnosis not present

## 2023-09-18 DIAGNOSIS — I700 Atherosclerosis of aorta: Secondary | ICD-10-CM | POA: Diagnosis not present

## 2023-09-18 DIAGNOSIS — M159 Polyosteoarthritis, unspecified: Secondary | ICD-10-CM | POA: Diagnosis not present

## 2023-09-18 DIAGNOSIS — D509 Iron deficiency anemia, unspecified: Secondary | ICD-10-CM | POA: Diagnosis not present

## 2023-09-18 DIAGNOSIS — N186 End stage renal disease: Secondary | ICD-10-CM | POA: Diagnosis not present

## 2023-09-18 DIAGNOSIS — Z87891 Personal history of nicotine dependence: Secondary | ICD-10-CM | POA: Diagnosis not present

## 2023-09-18 DIAGNOSIS — H35033 Hypertensive retinopathy, bilateral: Secondary | ICD-10-CM | POA: Diagnosis not present

## 2023-09-18 DIAGNOSIS — I480 Paroxysmal atrial fibrillation: Secondary | ICD-10-CM | POA: Diagnosis not present

## 2023-09-18 DIAGNOSIS — Z89512 Acquired absence of left leg below knee: Secondary | ICD-10-CM | POA: Diagnosis not present

## 2023-09-18 DIAGNOSIS — M810 Age-related osteoporosis without current pathological fracture: Secondary | ICD-10-CM | POA: Diagnosis not present

## 2023-09-18 DIAGNOSIS — E46 Unspecified protein-calorie malnutrition: Secondary | ICD-10-CM | POA: Diagnosis not present

## 2023-09-18 DIAGNOSIS — E785 Hyperlipidemia, unspecified: Secondary | ICD-10-CM | POA: Diagnosis not present

## 2023-09-18 DIAGNOSIS — N2581 Secondary hyperparathyroidism of renal origin: Secondary | ICD-10-CM | POA: Diagnosis not present

## 2023-09-18 DIAGNOSIS — E1129 Type 2 diabetes mellitus with other diabetic kidney complication: Secondary | ICD-10-CM | POA: Diagnosis not present

## 2023-09-18 DIAGNOSIS — R197 Diarrhea, unspecified: Secondary | ICD-10-CM | POA: Diagnosis not present

## 2023-09-18 DIAGNOSIS — D631 Anemia in chronic kidney disease: Secondary | ICD-10-CM | POA: Diagnosis not present

## 2023-09-18 DIAGNOSIS — E113411 Type 2 diabetes mellitus with severe nonproliferative diabetic retinopathy with macular edema, right eye: Secondary | ICD-10-CM | POA: Diagnosis not present

## 2023-09-18 DIAGNOSIS — E11621 Type 2 diabetes mellitus with foot ulcer: Secondary | ICD-10-CM | POA: Diagnosis not present

## 2023-09-18 DIAGNOSIS — I2510 Atherosclerotic heart disease of native coronary artery without angina pectoris: Secondary | ICD-10-CM | POA: Diagnosis not present

## 2023-09-18 DIAGNOSIS — E1122 Type 2 diabetes mellitus with diabetic chronic kidney disease: Secondary | ICD-10-CM | POA: Diagnosis not present

## 2023-09-18 DIAGNOSIS — I132 Hypertensive heart and chronic kidney disease with heart failure and with stage 5 chronic kidney disease, or end stage renal disease: Secondary | ICD-10-CM | POA: Diagnosis not present

## 2023-09-18 DIAGNOSIS — L97411 Non-pressure chronic ulcer of right heel and midfoot limited to breakdown of skin: Secondary | ICD-10-CM | POA: Diagnosis not present

## 2023-09-18 DIAGNOSIS — Z4781 Encounter for orthopedic aftercare following surgical amputation: Secondary | ICD-10-CM | POA: Diagnosis not present

## 2023-09-18 DIAGNOSIS — E1151 Type 2 diabetes mellitus with diabetic peripheral angiopathy without gangrene: Secondary | ICD-10-CM | POA: Diagnosis not present

## 2023-09-18 DIAGNOSIS — I5032 Chronic diastolic (congestive) heart failure: Secondary | ICD-10-CM | POA: Diagnosis not present

## 2023-09-18 DIAGNOSIS — M797 Fibromyalgia: Secondary | ICD-10-CM | POA: Diagnosis not present

## 2023-09-18 DIAGNOSIS — J302 Other seasonal allergic rhinitis: Secondary | ICD-10-CM | POA: Diagnosis not present

## 2023-09-19 DIAGNOSIS — E46 Unspecified protein-calorie malnutrition: Secondary | ICD-10-CM | POA: Diagnosis not present

## 2023-09-19 DIAGNOSIS — H35033 Hypertensive retinopathy, bilateral: Secondary | ICD-10-CM | POA: Diagnosis not present

## 2023-09-19 DIAGNOSIS — M159 Polyosteoarthritis, unspecified: Secondary | ICD-10-CM | POA: Diagnosis not present

## 2023-09-19 DIAGNOSIS — E1151 Type 2 diabetes mellitus with diabetic peripheral angiopathy without gangrene: Secondary | ICD-10-CM | POA: Diagnosis not present

## 2023-09-19 DIAGNOSIS — Z87891 Personal history of nicotine dependence: Secondary | ICD-10-CM | POA: Diagnosis not present

## 2023-09-19 DIAGNOSIS — E11621 Type 2 diabetes mellitus with foot ulcer: Secondary | ICD-10-CM | POA: Diagnosis not present

## 2023-09-19 DIAGNOSIS — L97411 Non-pressure chronic ulcer of right heel and midfoot limited to breakdown of skin: Secondary | ICD-10-CM | POA: Diagnosis not present

## 2023-09-19 DIAGNOSIS — Z89512 Acquired absence of left leg below knee: Secondary | ICD-10-CM | POA: Diagnosis not present

## 2023-09-19 DIAGNOSIS — I5032 Chronic diastolic (congestive) heart failure: Secondary | ICD-10-CM | POA: Diagnosis not present

## 2023-09-19 DIAGNOSIS — M810 Age-related osteoporosis without current pathological fracture: Secondary | ICD-10-CM | POA: Diagnosis not present

## 2023-09-19 DIAGNOSIS — E785 Hyperlipidemia, unspecified: Secondary | ICD-10-CM | POA: Diagnosis not present

## 2023-09-19 DIAGNOSIS — I2510 Atherosclerotic heart disease of native coronary artery without angina pectoris: Secondary | ICD-10-CM | POA: Diagnosis not present

## 2023-09-19 DIAGNOSIS — Z4781 Encounter for orthopedic aftercare following surgical amputation: Secondary | ICD-10-CM | POA: Diagnosis not present

## 2023-09-19 DIAGNOSIS — E1122 Type 2 diabetes mellitus with diabetic chronic kidney disease: Secondary | ICD-10-CM | POA: Diagnosis not present

## 2023-09-19 DIAGNOSIS — I480 Paroxysmal atrial fibrillation: Secondary | ICD-10-CM | POA: Diagnosis not present

## 2023-09-19 DIAGNOSIS — E113411 Type 2 diabetes mellitus with severe nonproliferative diabetic retinopathy with macular edema, right eye: Secondary | ICD-10-CM | POA: Diagnosis not present

## 2023-09-19 DIAGNOSIS — M797 Fibromyalgia: Secondary | ICD-10-CM | POA: Diagnosis not present

## 2023-09-19 DIAGNOSIS — J302 Other seasonal allergic rhinitis: Secondary | ICD-10-CM | POA: Diagnosis not present

## 2023-09-19 DIAGNOSIS — I132 Hypertensive heart and chronic kidney disease with heart failure and with stage 5 chronic kidney disease, or end stage renal disease: Secondary | ICD-10-CM | POA: Diagnosis not present

## 2023-09-19 DIAGNOSIS — I700 Atherosclerosis of aorta: Secondary | ICD-10-CM | POA: Diagnosis not present

## 2023-09-19 DIAGNOSIS — N186 End stage renal disease: Secondary | ICD-10-CM | POA: Diagnosis not present

## 2023-09-20 DIAGNOSIS — R197 Diarrhea, unspecified: Secondary | ICD-10-CM | POA: Diagnosis not present

## 2023-09-20 DIAGNOSIS — N186 End stage renal disease: Secondary | ICD-10-CM | POA: Diagnosis not present

## 2023-09-20 DIAGNOSIS — D631 Anemia in chronic kidney disease: Secondary | ICD-10-CM | POA: Diagnosis not present

## 2023-09-20 DIAGNOSIS — D509 Iron deficiency anemia, unspecified: Secondary | ICD-10-CM | POA: Diagnosis not present

## 2023-09-20 DIAGNOSIS — N2581 Secondary hyperparathyroidism of renal origin: Secondary | ICD-10-CM | POA: Diagnosis not present

## 2023-09-20 DIAGNOSIS — Z992 Dependence on renal dialysis: Secondary | ICD-10-CM | POA: Diagnosis not present

## 2023-09-20 DIAGNOSIS — D689 Coagulation defect, unspecified: Secondary | ICD-10-CM | POA: Diagnosis not present

## 2023-09-20 DIAGNOSIS — E1129 Type 2 diabetes mellitus with other diabetic kidney complication: Secondary | ICD-10-CM | POA: Diagnosis not present

## 2023-09-22 DIAGNOSIS — N186 End stage renal disease: Secondary | ICD-10-CM | POA: Diagnosis not present

## 2023-09-22 DIAGNOSIS — N2581 Secondary hyperparathyroidism of renal origin: Secondary | ICD-10-CM | POA: Diagnosis not present

## 2023-09-22 DIAGNOSIS — D631 Anemia in chronic kidney disease: Secondary | ICD-10-CM | POA: Diagnosis not present

## 2023-09-22 DIAGNOSIS — D689 Coagulation defect, unspecified: Secondary | ICD-10-CM | POA: Diagnosis not present

## 2023-09-22 DIAGNOSIS — R197 Diarrhea, unspecified: Secondary | ICD-10-CM | POA: Diagnosis not present

## 2023-09-22 DIAGNOSIS — E1129 Type 2 diabetes mellitus with other diabetic kidney complication: Secondary | ICD-10-CM | POA: Diagnosis not present

## 2023-09-22 DIAGNOSIS — Z992 Dependence on renal dialysis: Secondary | ICD-10-CM | POA: Diagnosis not present

## 2023-09-22 DIAGNOSIS — D509 Iron deficiency anemia, unspecified: Secondary | ICD-10-CM | POA: Diagnosis not present

## 2023-09-24 DIAGNOSIS — Z992 Dependence on renal dialysis: Secondary | ICD-10-CM | POA: Diagnosis not present

## 2023-09-24 DIAGNOSIS — M797 Fibromyalgia: Secondary | ICD-10-CM | POA: Diagnosis not present

## 2023-09-24 DIAGNOSIS — I5032 Chronic diastolic (congestive) heart failure: Secondary | ICD-10-CM | POA: Diagnosis not present

## 2023-09-24 DIAGNOSIS — I2510 Atherosclerotic heart disease of native coronary artery without angina pectoris: Secondary | ICD-10-CM | POA: Diagnosis not present

## 2023-09-24 DIAGNOSIS — Z87891 Personal history of nicotine dependence: Secondary | ICD-10-CM | POA: Diagnosis not present

## 2023-09-24 DIAGNOSIS — N2581 Secondary hyperparathyroidism of renal origin: Secondary | ICD-10-CM | POA: Diagnosis not present

## 2023-09-24 DIAGNOSIS — D509 Iron deficiency anemia, unspecified: Secondary | ICD-10-CM | POA: Diagnosis not present

## 2023-09-24 DIAGNOSIS — D689 Coagulation defect, unspecified: Secondary | ICD-10-CM | POA: Diagnosis not present

## 2023-09-24 DIAGNOSIS — Z4781 Encounter for orthopedic aftercare following surgical amputation: Secondary | ICD-10-CM | POA: Diagnosis not present

## 2023-09-24 DIAGNOSIS — N186 End stage renal disease: Secondary | ICD-10-CM | POA: Diagnosis not present

## 2023-09-24 DIAGNOSIS — M810 Age-related osteoporosis without current pathological fracture: Secondary | ICD-10-CM | POA: Diagnosis not present

## 2023-09-24 DIAGNOSIS — I700 Atherosclerosis of aorta: Secondary | ICD-10-CM | POA: Diagnosis not present

## 2023-09-24 DIAGNOSIS — D631 Anemia in chronic kidney disease: Secondary | ICD-10-CM | POA: Diagnosis not present

## 2023-09-24 DIAGNOSIS — E1122 Type 2 diabetes mellitus with diabetic chronic kidney disease: Secondary | ICD-10-CM | POA: Diagnosis not present

## 2023-09-24 DIAGNOSIS — E785 Hyperlipidemia, unspecified: Secondary | ICD-10-CM | POA: Diagnosis not present

## 2023-09-24 DIAGNOSIS — E11621 Type 2 diabetes mellitus with foot ulcer: Secondary | ICD-10-CM | POA: Diagnosis not present

## 2023-09-24 DIAGNOSIS — R197 Diarrhea, unspecified: Secondary | ICD-10-CM | POA: Diagnosis not present

## 2023-09-24 DIAGNOSIS — Z89512 Acquired absence of left leg below knee: Secondary | ICD-10-CM | POA: Diagnosis not present

## 2023-09-24 DIAGNOSIS — E46 Unspecified protein-calorie malnutrition: Secondary | ICD-10-CM | POA: Diagnosis not present

## 2023-09-24 DIAGNOSIS — E113411 Type 2 diabetes mellitus with severe nonproliferative diabetic retinopathy with macular edema, right eye: Secondary | ICD-10-CM | POA: Diagnosis not present

## 2023-09-24 DIAGNOSIS — I132 Hypertensive heart and chronic kidney disease with heart failure and with stage 5 chronic kidney disease, or end stage renal disease: Secondary | ICD-10-CM | POA: Diagnosis not present

## 2023-09-24 DIAGNOSIS — J302 Other seasonal allergic rhinitis: Secondary | ICD-10-CM | POA: Diagnosis not present

## 2023-09-24 DIAGNOSIS — H35033 Hypertensive retinopathy, bilateral: Secondary | ICD-10-CM | POA: Diagnosis not present

## 2023-09-24 DIAGNOSIS — I480 Paroxysmal atrial fibrillation: Secondary | ICD-10-CM | POA: Diagnosis not present

## 2023-09-24 DIAGNOSIS — L97411 Non-pressure chronic ulcer of right heel and midfoot limited to breakdown of skin: Secondary | ICD-10-CM | POA: Diagnosis not present

## 2023-09-24 DIAGNOSIS — E1129 Type 2 diabetes mellitus with other diabetic kidney complication: Secondary | ICD-10-CM | POA: Diagnosis not present

## 2023-09-24 DIAGNOSIS — E1151 Type 2 diabetes mellitus with diabetic peripheral angiopathy without gangrene: Secondary | ICD-10-CM | POA: Diagnosis not present

## 2023-09-24 DIAGNOSIS — M159 Polyosteoarthritis, unspecified: Secondary | ICD-10-CM | POA: Diagnosis not present

## 2023-09-27 DIAGNOSIS — D631 Anemia in chronic kidney disease: Secondary | ICD-10-CM | POA: Diagnosis not present

## 2023-09-27 DIAGNOSIS — N186 End stage renal disease: Secondary | ICD-10-CM | POA: Diagnosis not present

## 2023-09-27 DIAGNOSIS — D689 Coagulation defect, unspecified: Secondary | ICD-10-CM | POA: Diagnosis not present

## 2023-09-27 DIAGNOSIS — N2581 Secondary hyperparathyroidism of renal origin: Secondary | ICD-10-CM | POA: Diagnosis not present

## 2023-09-27 DIAGNOSIS — D509 Iron deficiency anemia, unspecified: Secondary | ICD-10-CM | POA: Diagnosis not present

## 2023-09-27 DIAGNOSIS — E1129 Type 2 diabetes mellitus with other diabetic kidney complication: Secondary | ICD-10-CM | POA: Diagnosis not present

## 2023-09-27 DIAGNOSIS — R197 Diarrhea, unspecified: Secondary | ICD-10-CM | POA: Diagnosis not present

## 2023-09-27 DIAGNOSIS — Z992 Dependence on renal dialysis: Secondary | ICD-10-CM | POA: Diagnosis not present

## 2023-09-28 DIAGNOSIS — Z992 Dependence on renal dialysis: Secondary | ICD-10-CM | POA: Diagnosis not present

## 2023-09-28 DIAGNOSIS — N186 End stage renal disease: Secondary | ICD-10-CM | POA: Diagnosis not present

## 2023-09-28 DIAGNOSIS — E1129 Type 2 diabetes mellitus with other diabetic kidney complication: Secondary | ICD-10-CM | POA: Diagnosis not present

## 2023-09-30 ENCOUNTER — Encounter: Payer: Self-pay | Admitting: Orthopedic Surgery

## 2023-09-30 DIAGNOSIS — D509 Iron deficiency anemia, unspecified: Secondary | ICD-10-CM | POA: Diagnosis not present

## 2023-09-30 DIAGNOSIS — R52 Pain, unspecified: Secondary | ICD-10-CM | POA: Diagnosis not present

## 2023-09-30 DIAGNOSIS — Z992 Dependence on renal dialysis: Secondary | ICD-10-CM | POA: Diagnosis not present

## 2023-09-30 DIAGNOSIS — E1129 Type 2 diabetes mellitus with other diabetic kidney complication: Secondary | ICD-10-CM | POA: Diagnosis not present

## 2023-09-30 DIAGNOSIS — I151 Hypertension secondary to other renal disorders: Secondary | ICD-10-CM | POA: Diagnosis not present

## 2023-09-30 DIAGNOSIS — R197 Diarrhea, unspecified: Secondary | ICD-10-CM | POA: Diagnosis not present

## 2023-09-30 DIAGNOSIS — D631 Anemia in chronic kidney disease: Secondary | ICD-10-CM | POA: Diagnosis not present

## 2023-09-30 DIAGNOSIS — D689 Coagulation defect, unspecified: Secondary | ICD-10-CM | POA: Diagnosis not present

## 2023-09-30 DIAGNOSIS — N2581 Secondary hyperparathyroidism of renal origin: Secondary | ICD-10-CM | POA: Diagnosis not present

## 2023-09-30 DIAGNOSIS — N186 End stage renal disease: Secondary | ICD-10-CM | POA: Diagnosis not present
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
## 2023-10-02 DIAGNOSIS — D689 Coagulation defect, unspecified: Secondary | ICD-10-CM | POA: Diagnosis not present

## 2023-10-02 DIAGNOSIS — N2581 Secondary hyperparathyroidism of renal origin: Secondary | ICD-10-CM | POA: Diagnosis not present

## 2023-10-02 DIAGNOSIS — D509 Iron deficiency anemia, unspecified: Secondary | ICD-10-CM | POA: Diagnosis not present

## 2023-10-02 DIAGNOSIS — R197 Diarrhea, unspecified: Secondary | ICD-10-CM | POA: Diagnosis not present

## 2023-10-02 DIAGNOSIS — E1129 Type 2 diabetes mellitus with other diabetic kidney complication: Secondary | ICD-10-CM | POA: Diagnosis not present

## 2023-10-02 DIAGNOSIS — D631 Anemia in chronic kidney disease: Secondary | ICD-10-CM | POA: Diagnosis not present

## 2023-10-02 DIAGNOSIS — Z992 Dependence on renal dialysis: Secondary | ICD-10-CM | POA: Diagnosis not present

## 2023-10-02 DIAGNOSIS — N186 End stage renal disease: Secondary | ICD-10-CM | POA: Diagnosis not present

## 2023-10-02 DIAGNOSIS — R52 Pain, unspecified: Secondary | ICD-10-CM | POA: Diagnosis not present

## 2023-10-02 DIAGNOSIS — I151 Hypertension secondary to other renal disorders: Secondary | ICD-10-CM | POA: Diagnosis not present

## 2023-10-03 DIAGNOSIS — L97411 Non-pressure chronic ulcer of right heel and midfoot limited to breakdown of skin: Secondary | ICD-10-CM | POA: Diagnosis not present

## 2023-10-03 DIAGNOSIS — E1151 Type 2 diabetes mellitus with diabetic peripheral angiopathy without gangrene: Secondary | ICD-10-CM | POA: Diagnosis not present

## 2023-10-03 DIAGNOSIS — E113411 Type 2 diabetes mellitus with severe nonproliferative diabetic retinopathy with macular edema, right eye: Secondary | ICD-10-CM | POA: Diagnosis not present

## 2023-10-03 DIAGNOSIS — I480 Paroxysmal atrial fibrillation: Secondary | ICD-10-CM | POA: Diagnosis not present

## 2023-10-03 DIAGNOSIS — I5032 Chronic diastolic (congestive) heart failure: Secondary | ICD-10-CM | POA: Diagnosis not present

## 2023-10-03 DIAGNOSIS — E11621 Type 2 diabetes mellitus with foot ulcer: Secondary | ICD-10-CM | POA: Diagnosis not present

## 2023-10-03 DIAGNOSIS — J302 Other seasonal allergic rhinitis: Secondary | ICD-10-CM | POA: Diagnosis not present

## 2023-10-03 DIAGNOSIS — M159 Polyosteoarthritis, unspecified: Secondary | ICD-10-CM | POA: Diagnosis not present

## 2023-10-03 DIAGNOSIS — E46 Unspecified protein-calorie malnutrition: Secondary | ICD-10-CM | POA: Diagnosis not present

## 2023-10-03 DIAGNOSIS — Z87891 Personal history of nicotine dependence: Secondary | ICD-10-CM | POA: Diagnosis not present

## 2023-10-03 DIAGNOSIS — N186 End stage renal disease: Secondary | ICD-10-CM | POA: Diagnosis not present

## 2023-10-03 DIAGNOSIS — H35033 Hypertensive retinopathy, bilateral: Secondary | ICD-10-CM | POA: Diagnosis not present

## 2023-10-03 DIAGNOSIS — I2510 Atherosclerotic heart disease of native coronary artery without angina pectoris: Secondary | ICD-10-CM | POA: Diagnosis not present

## 2023-10-03 DIAGNOSIS — I700 Atherosclerosis of aorta: Secondary | ICD-10-CM | POA: Diagnosis not present

## 2023-10-03 DIAGNOSIS — Z4781 Encounter for orthopedic aftercare following surgical amputation: Secondary | ICD-10-CM | POA: Diagnosis not present

## 2023-10-03 DIAGNOSIS — E1122 Type 2 diabetes mellitus with diabetic chronic kidney disease: Secondary | ICD-10-CM | POA: Diagnosis not present

## 2023-10-03 DIAGNOSIS — E785 Hyperlipidemia, unspecified: Secondary | ICD-10-CM | POA: Diagnosis not present

## 2023-10-03 DIAGNOSIS — Z89512 Acquired absence of left leg below knee: Secondary | ICD-10-CM | POA: Diagnosis not present

## 2023-10-03 DIAGNOSIS — M810 Age-related osteoporosis without current pathological fracture: Secondary | ICD-10-CM | POA: Diagnosis not present

## 2023-10-03 DIAGNOSIS — M797 Fibromyalgia: Secondary | ICD-10-CM | POA: Diagnosis not present

## 2023-10-03 DIAGNOSIS — I132 Hypertensive heart and chronic kidney disease with heart failure and with stage 5 chronic kidney disease, or end stage renal disease: Secondary | ICD-10-CM | POA: Diagnosis not present

## 2023-10-04 DIAGNOSIS — D631 Anemia in chronic kidney disease: Secondary | ICD-10-CM | POA: Diagnosis not present

## 2023-10-04 DIAGNOSIS — Z992 Dependence on renal dialysis: Secondary | ICD-10-CM | POA: Diagnosis not present

## 2023-10-04 DIAGNOSIS — D689 Coagulation defect, unspecified: Secondary | ICD-10-CM | POA: Diagnosis not present

## 2023-10-04 DIAGNOSIS — D509 Iron deficiency anemia, unspecified: Secondary | ICD-10-CM | POA: Diagnosis not present

## 2023-10-04 DIAGNOSIS — N2581 Secondary hyperparathyroidism of renal origin: Secondary | ICD-10-CM | POA: Diagnosis not present

## 2023-10-04 DIAGNOSIS — R197 Diarrhea, unspecified: Secondary | ICD-10-CM | POA: Diagnosis not present

## 2023-10-04 DIAGNOSIS — N186 End stage renal disease: Secondary | ICD-10-CM | POA: Diagnosis not present

## 2023-10-04 DIAGNOSIS — R52 Pain, unspecified: Secondary | ICD-10-CM | POA: Diagnosis not present

## 2023-10-04 DIAGNOSIS — E1129 Type 2 diabetes mellitus with other diabetic kidney complication: Secondary | ICD-10-CM | POA: Diagnosis not present

## 2023-10-04 DIAGNOSIS — I151 Hypertension secondary to other renal disorders: Secondary | ICD-10-CM | POA: Diagnosis not present

## 2023-10-07 DIAGNOSIS — N2581 Secondary hyperparathyroidism of renal origin: Secondary | ICD-10-CM | POA: Diagnosis not present

## 2023-10-07 DIAGNOSIS — I151 Hypertension secondary to other renal disorders: Secondary | ICD-10-CM | POA: Diagnosis not present

## 2023-10-07 DIAGNOSIS — D689 Coagulation defect, unspecified: Secondary | ICD-10-CM | POA: Diagnosis not present

## 2023-10-07 DIAGNOSIS — R52 Pain, unspecified: Secondary | ICD-10-CM | POA: Diagnosis not present

## 2023-10-07 DIAGNOSIS — E1129 Type 2 diabetes mellitus with other diabetic kidney complication: Secondary | ICD-10-CM | POA: Diagnosis not present

## 2023-10-07 DIAGNOSIS — Z992 Dependence on renal dialysis: Secondary | ICD-10-CM | POA: Diagnosis not present

## 2023-10-07 DIAGNOSIS — D631 Anemia in chronic kidney disease: Secondary | ICD-10-CM | POA: Diagnosis not present

## 2023-10-07 DIAGNOSIS — D509 Iron deficiency anemia, unspecified: Secondary | ICD-10-CM | POA: Diagnosis not present

## 2023-10-07 DIAGNOSIS — R197 Diarrhea, unspecified: Secondary | ICD-10-CM | POA: Diagnosis not present

## 2023-10-07 DIAGNOSIS — N186 End stage renal disease: Secondary | ICD-10-CM | POA: Diagnosis not present

## 2023-10-08 ENCOUNTER — Ambulatory Visit (HOSPITAL_COMMUNITY): Admission: RE | Admit: 2023-10-08 | Payer: Medicare Other | Source: Home / Self Care | Admitting: Surgery

## 2023-10-08 ENCOUNTER — Encounter (HOSPITAL_COMMUNITY): Admission: RE | Payer: Self-pay | Source: Home / Self Care

## 2023-10-08 SURGERY — ABDOMINAL AORTOGRAM W/LOWER EXTREMITY
Anesthesia: LOCAL

## 2023-10-09 DIAGNOSIS — E1129 Type 2 diabetes mellitus with other diabetic kidney complication: Secondary | ICD-10-CM | POA: Diagnosis not present

## 2023-10-09 DIAGNOSIS — N186 End stage renal disease: Secondary | ICD-10-CM | POA: Diagnosis not present

## 2023-10-09 DIAGNOSIS — R52 Pain, unspecified: Secondary | ICD-10-CM | POA: Diagnosis not present

## 2023-10-09 DIAGNOSIS — I151 Hypertension secondary to other renal disorders: Secondary | ICD-10-CM | POA: Diagnosis not present

## 2023-10-09 DIAGNOSIS — D509 Iron deficiency anemia, unspecified: Secondary | ICD-10-CM | POA: Diagnosis not present

## 2023-10-09 DIAGNOSIS — R197 Diarrhea, unspecified: Secondary | ICD-10-CM | POA: Diagnosis not present

## 2023-10-09 DIAGNOSIS — N2581 Secondary hyperparathyroidism of renal origin: Secondary | ICD-10-CM | POA: Diagnosis not present

## 2023-10-09 DIAGNOSIS — D689 Coagulation defect, unspecified: Secondary | ICD-10-CM | POA: Diagnosis not present

## 2023-10-09 DIAGNOSIS — D631 Anemia in chronic kidney disease: Secondary | ICD-10-CM | POA: Diagnosis not present

## 2023-10-09 DIAGNOSIS — Z992 Dependence on renal dialysis: Secondary | ICD-10-CM | POA: Diagnosis not present

## 2023-10-10 ENCOUNTER — Other Ambulatory Visit: Payer: Self-pay

## 2023-10-10 ENCOUNTER — Ambulatory Visit (INDEPENDENT_AMBULATORY_CARE_PROVIDER_SITE_OTHER): Payer: Medicare Other | Admitting: Internal Medicine

## 2023-10-10 VITALS — BP 169/95 | HR 79 | Temp 97.8°F

## 2023-10-10 DIAGNOSIS — L97529 Non-pressure chronic ulcer of other part of left foot with unspecified severity: Secondary | ICD-10-CM | POA: Diagnosis not present

## 2023-10-10 DIAGNOSIS — M797 Fibromyalgia: Secondary | ICD-10-CM | POA: Diagnosis not present

## 2023-10-10 DIAGNOSIS — E1122 Type 2 diabetes mellitus with diabetic chronic kidney disease: Secondary | ICD-10-CM | POA: Diagnosis not present

## 2023-10-10 DIAGNOSIS — I5032 Chronic diastolic (congestive) heart failure: Secondary | ICD-10-CM | POA: Diagnosis not present

## 2023-10-10 DIAGNOSIS — Z4781 Encounter for orthopedic aftercare following surgical amputation: Secondary | ICD-10-CM | POA: Diagnosis not present

## 2023-10-10 DIAGNOSIS — N186 End stage renal disease: Secondary | ICD-10-CM | POA: Diagnosis not present

## 2023-10-10 DIAGNOSIS — Z89512 Acquired absence of left leg below knee: Secondary | ICD-10-CM | POA: Diagnosis not present

## 2023-10-10 DIAGNOSIS — M159 Polyosteoarthritis, unspecified: Secondary | ICD-10-CM | POA: Diagnosis not present

## 2023-10-10 DIAGNOSIS — L97411 Non-pressure chronic ulcer of right heel and midfoot limited to breakdown of skin: Secondary | ICD-10-CM | POA: Diagnosis not present

## 2023-10-10 DIAGNOSIS — H35033 Hypertensive retinopathy, bilateral: Secondary | ICD-10-CM | POA: Diagnosis not present

## 2023-10-10 DIAGNOSIS — E1151 Type 2 diabetes mellitus with diabetic peripheral angiopathy without gangrene: Secondary | ICD-10-CM | POA: Diagnosis not present

## 2023-10-10 DIAGNOSIS — E11621 Type 2 diabetes mellitus with foot ulcer: Secondary | ICD-10-CM | POA: Diagnosis not present

## 2023-10-10 DIAGNOSIS — E46 Unspecified protein-calorie malnutrition: Secondary | ICD-10-CM | POA: Diagnosis not present

## 2023-10-10 DIAGNOSIS — J302 Other seasonal allergic rhinitis: Secondary | ICD-10-CM | POA: Diagnosis not present

## 2023-10-10 DIAGNOSIS — Z87891 Personal history of nicotine dependence: Secondary | ICD-10-CM | POA: Diagnosis not present

## 2023-10-10 DIAGNOSIS — I480 Paroxysmal atrial fibrillation: Secondary | ICD-10-CM | POA: Diagnosis not present

## 2023-10-10 DIAGNOSIS — E113411 Type 2 diabetes mellitus with severe nonproliferative diabetic retinopathy with macular edema, right eye: Secondary | ICD-10-CM | POA: Diagnosis not present

## 2023-10-10 DIAGNOSIS — M810 Age-related osteoporosis without current pathological fracture: Secondary | ICD-10-CM | POA: Diagnosis not present

## 2023-10-10 DIAGNOSIS — I132 Hypertensive heart and chronic kidney disease with heart failure and with stage 5 chronic kidney disease, or end stage renal disease: Secondary | ICD-10-CM | POA: Diagnosis not present

## 2023-10-10 DIAGNOSIS — E785 Hyperlipidemia, unspecified: Secondary | ICD-10-CM | POA: Diagnosis not present

## 2023-10-10 DIAGNOSIS — I700 Atherosclerosis of aorta: Secondary | ICD-10-CM | POA: Diagnosis not present

## 2023-10-10 DIAGNOSIS — I2510 Atherosclerotic heart disease of native coronary artery without angina pectoris: Secondary | ICD-10-CM | POA: Diagnosis not present

## 2023-10-10 NOTE — Progress Notes (Signed)
[facility]  Patient Active Problem List   Diagnosis Date Noted   Nonrheumatic aortic valve stenosis 07/11/2023   Acute osteomyelitis of left calcaneus (HCC) 06/04/2023   Pressure injury of skin 05/28/2023   FUO (fever of unknown origin) 05/21/2023   Leukocytosis 05/21/2023   History of CAD (coronary artery disease) 05/21/2023   Elevated temperature due to infection 05/21/2023   Pulmonary nodules 05/21/2023   Hemorrhoids 03/22/2023   Internal hemorrhoid 03/22/2023   Bright red blood per rectum 03/21/2023   Prolonged QT interval 03/21/2023   Precordial pain    Critical limb ischemia of right lower extremity (HCC)    Acute osteomyelitis of toe, right (HCC) 03/13/2022   Osteomyelitis (HCC) 03/13/2022   Elevated troponin    SIRS (systemic inflammatory response syndrome) (HCC) 03/12/2022   ESRD (end stage renal disease) (HCC) 10/19/2021   Community acquired pneumonia 02/01/2021   PNA (pneumonia) 02/01/2021   Hypoxia    Pleural effusion on right 12/02/2020   Pericardial effusion 11/02/2020   Loss of consciousness (HCC) 09/16/2020   Syncope and collapse 09/15/2020   CKD (chronic kidney disease), stage IV (HCC) 08/11/2020   Acute kidney injury superimposed on CKD (HCC) 07/31/2020   Hyperlipidemia associated with type 2 diabetes mellitus (HCC) 07/31/2020   Paroxysmal atrial fibrillation (HCC) 07/31/2020   Chronic disease anemia 07/31/2020   Nausea and vomiting 07/31/2020   PAD (peripheral artery disease) (HCC) 03/19/2020   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, limited to breakdown of skin (HCC) 01/08/2020   Status post vascular surgery 12/07/2019   Respiratory failure (HCC) 12/07/2019   Persistent atrial fibrillation (HCC) 07/10/2019   H/O: GI bleed 07/10/2019   CKD (chronic kidney disease) stage 5, GFR less than 15 ml/min (HCC) 06/08/2019   Erectile dysfunction 05/06/2019   Coronary artery disease involving native coronary artery of  native heart without angina pectoris 04/05/2019   Acute on chronic respiratory failure with hypoxia (HCC) 12/10/2018   Healthcare-associated pneumonia 12/10/2018   Hemoptysis 12/10/2018   Sepsis (HCC) 12/10/2018   Acute respiratory failure with hypoxia (HCC) 12/10/2018   Severe protein-calorie malnutrition (HCC) 12/02/2018   CAD (coronary artery disease) 12/01/2018   Syncope 12/01/2018   DOE (dyspnea on exertion) 11/18/2018   Hypertension associated with diabetes (HCC)    Hyperlipidemia    Diabetes mellitus with renal complications (HCC)    Noncompliance with medication regimen    Chronic pain syndrome 05/29/2018   Insulin dependent type 2 diabetes mellitus (HCC) 04/07/2018   Stage 3a chronic kidney disease (HCC) 03/11/2018   Fall 11/20/2017   AKI (acute kidney injury) (HCC) 11/20/2017   Normocytic normochromic anemia 11/20/2017   Hypoglycemia 11/19/2017   Essential hypertension 11/19/2017      Subjective:    Patient ID: William Webb, male    DOB: 08/19/1951, 72 y.o.   MRN: [id number]  No chief complaint on file.   HPI:  William Webb is a 72 y.o. male seen by me inpatient 05/20/2023 for prolonged fever but extensive negative fuo w/u presumed prolonged viral syndrome, now here in clinic 10/10/23 for "concern with wound infection."  During 04/2023 admission, he did have left foot ulcer during then and ultimately had left tba 06/07/23 given nonhealing and concern for chronic OM I reviewed chart   He was supposed to follow up ID clinic for closure of the fever workup on 06/20/23 but no showed.  He is here today for ?presumed made up  of 06/20/23 no show    Since admission had developed right heel ulcer Had seen vascular surgery 09/10/23. Noted him being smoker. Concern for ischemic ulcer. Plan for angiogram with possible intervention    On 09/17/23 he saw dr Lajoyce Corners in clinic "S/p left tba... 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 He was rx'ed [facility] to offload pressure on the right foot     He has wound nurse that change dressing daily; he said the right heel ulcer getting smaller  No f/c No foot pain No ulcer discharge/purulence  He hasn't taken any antibiotics the last few months  No Known Allergies    Outpatient Medications Prior to Visit  Medication Sig Dispense Refill   acetaminophen (TYLENOL) 325 MG tablet Take 1-2 tablets (325-650 mg total) by mouth every 6 (six) hours as needed for mild pain (pain score 1-3 or temp > 100.5).     apixaban (ELIQUIS) 5 MG TABS tablet Take 1 tablet (5 mg total) by mouth 2 (two) times daily. 180 tablet 3   aspirin EC 81 MG tablet Take 81 mg by mouth daily. Swallow whole.     atorvastatin (LIPITOR) 80 MG tablet Take 1 tablet (80 mg total) by mouth daily.     carvedilol (COREG) 25 MG tablet Take 1 tablet (25 mg total) by mouth 2 (two) times daily.     ethyl chloride spray Apply 1 Application topically 3 (three) times a week. At dialysis     finasteride (PROSCAR) 5 MG tablet Take 5 mg by mouth daily.     gabapentin (NEURONTIN) 600 MG tablet Take 0.5 tablets (300 mg total) by mouth 2 (two) times daily. (Patient taking differently: Take 600 mg by mouth 2 (two) times daily.) 60 tablet 0   isosorbide mononitrate (IMDUR) 60 MG 24 hr tablet Take 1 tablet (60 mg total) by mouth daily. 30 tablet 0   lidocaine-prilocaine (EMLA) cream Apply 1 application  topically as needed (prior to port being accessed).     linaclotide (LINZESS) 145 MCG CAPS capsule Take 145 mcg by mouth as needed (constipation).     loratadine (CLARITIN) 10 MG tablet Take 10 mg by mouth daily as needed for allergies.     mirtazapine (REMERON SOL-TAB) 15 MG disintegrating tablet Take 1 tablet (15 mg total) by mouth at bedtime.     omeprazole (PRILOSEC) 20 MG capsule Take 20 mg by mouth daily.     polyethylene glycol powder (GLYCOLAX/MIRALAX) 17  GM/SCOOP powder Take 17 g by mouth in the morning, at noon, and at bedtime. One scoop in beverage of your choice with each meal. You may increase if you continue to be constipated and decrease if your stool becomes too loose. (Patient taking differently: Take 17 g by mouth daily as needed for moderate constipation.) 255 g 0   sevelamer carbonate (RENVELA) 800 MG tablet Take 2 tablets (1,600 mg total) by mouth 3 (three) times daily with meals.     tamsulosin (FLOMAX) 0.4 MG CAPS capsule Take 0.4 mg by mouth at bedtime.     No facility-administered medications prior to visit.     Social History   Socioeconomic History   Marital status: Legally Separated    Spouse name: Not on file   Number of children: 4   Years of education: Not on file   Highest education level: Not on file  Occupational History   Not on  file  Tobacco Use   Smoking status: Former    Current packs/day: 0.00    Average packs/day: 0.3 packs/day for 20.0 years (6.6 ttl pk-yrs)    Types: Cigarettes    Start date: 15    Quit date: 72    Years since quitting: 39.9   Smokeless tobacco: Never  Vaping Use   Vaping status: Never Used  Substance and Sexual Activity   Alcohol use: Not Currently   Drug use: Not Currently   Sexual activity: Not Currently  Other Topics Concern   Not on file  Social History Narrative   Not on file   Social Drivers of Health   Financial Resource Strain: Not on file  Food Insecurity: No Food Insecurity (05/21/2023)   Hunger Vital Sign    Worried About Running Out of Food in the Last Year: Never true    Ran Out of Food in the Last Year: Never true  Transportation Needs: No Transportation Needs (06/05/2023)   PRAPARE - Administrator, Civil Service (Medical): No    Lack of Transportation (Non-Medical): No  Physical Activity: Not on file  Stress: Not on file  Social Connections: Not on file  Intimate Partner Violence: Not At Risk (05/21/2023)   Humiliation, Afraid, Rape, and  Kick questionnaire    Fear of Current or Ex-Partner: No    Emotionally Abused: No    Physically Abused: No    Sexually Abused: No      Review of Systems    All other ros negative Objective:    BP (!) 169/95   Pulse 79   Temp 97.8 F (36.6 C) (Temporal)  Nursing note and vital signs reviewed.  Physical Exam     General/constitutional: no distress, pleasant HEENT: Normocephalic, PER, Conj Clear, EOMI, Oropharynx clear Neck supple CV: rrr no mrg Lungs: clear to auscultation, normal respiratory effort Abd: Soft, Nontender Msk: left bka; right heel 1 inch ulcer no exposed bone; healthy appearing underlying tissue   10/10/23   Labs: Lab Results  Component Value Date   WBC 9.6 06/11/2023   HGB 9.8 (L) 06/11/2023   HCT 30.3 (L) 06/11/2023   MCV 91.5 06/11/2023   PLT 286 06/11/2023   Last metabolic panel Lab Results  Component Value Date   GLUCOSE 234 (H) 06/11/2023   NA 131 (L) 06/11/2023   K 3.3 (L) 06/11/2023   CL 93 (L) 06/11/2023   CO2 24 06/11/2023   BUN 33 (H) 06/11/2023   CREATININE 4.23 (H) 06/11/2023   GFRNONAA 14 (L) 06/11/2023   CALCIUM 7.9 (L) 06/11/2023   PHOS 3.1 06/11/2023   PROT 6.7 06/11/2023   ALBUMIN 1.8 (L) 06/11/2023   LABGLOB 2.8 09/11/2019   BILITOT 0.5 06/11/2023   ALKPHOS 67 06/11/2023   AST 18 06/11/2023   ALT 7 06/11/2023   ANIONGAP 14 06/11/2023    Micro:  Serology:  Imaging:  Assessment & Plan:   Problem List Items Addressed This Visit   None Visit Diagnoses       Ulcer of foot, chronic, left, with unspecified severity (HCC)    -  Primary         No orders of the defined types were placed in this encounter.  No sign of soft tissue infection Wound closing so will not do any further ID workup at this time  Patient to have angiogram next week  If concerning for infection (discharge/purulence/fever/chill) would get mri ankle and discuss with dr Lajoyce Corners if patient would  want to proceed with surgery or try  conservative measure   No need to follow up with id at this time   No mri/labs planned at this time   Follow-up: No follow-ups on file.      Raymondo Band, MD [facility] 10/10/2023, 1:40 PM

## 2023-10-10 NOTE — Patient Instructions (Signed)
No sign of infection now  Wound is better with wound care   I agree you'd need to get the angiogram done to optimize vascular flow and wound healing   Continue wound care. If dr Lajoyce Corners is concern for bone infection in the right heel and would like prolonged antibiotics we could see you again then  For now no need to follow up with ID

## 2023-10-11 DIAGNOSIS — E1129 Type 2 diabetes mellitus with other diabetic kidney complication: Secondary | ICD-10-CM | POA: Diagnosis not present

## 2023-10-11 DIAGNOSIS — D631 Anemia in chronic kidney disease: Secondary | ICD-10-CM | POA: Diagnosis not present

## 2023-10-11 DIAGNOSIS — D689 Coagulation defect, unspecified: Secondary | ICD-10-CM | POA: Diagnosis not present

## 2023-10-11 DIAGNOSIS — N2581 Secondary hyperparathyroidism of renal origin: Secondary | ICD-10-CM | POA: Diagnosis not present

## 2023-10-11 DIAGNOSIS — R52 Pain, unspecified: Secondary | ICD-10-CM | POA: Diagnosis not present

## 2023-10-11 DIAGNOSIS — D509 Iron deficiency anemia, unspecified: Secondary | ICD-10-CM | POA: Diagnosis not present

## 2023-10-11 DIAGNOSIS — I151 Hypertension secondary to other renal disorders: Secondary | ICD-10-CM | POA: Diagnosis not present

## 2023-10-11 DIAGNOSIS — R197 Diarrhea, unspecified: Secondary | ICD-10-CM | POA: Diagnosis not present

## 2023-10-11 DIAGNOSIS — N186 End stage renal disease: Secondary | ICD-10-CM | POA: Diagnosis not present

## 2023-10-11 DIAGNOSIS — Z992 Dependence on renal dialysis: Secondary | ICD-10-CM | POA: Diagnosis not present

## 2023-10-14 DIAGNOSIS — N186 End stage renal disease: Secondary | ICD-10-CM | POA: Diagnosis not present

## 2023-10-14 DIAGNOSIS — I480 Paroxysmal atrial fibrillation: Secondary | ICD-10-CM | POA: Diagnosis not present

## 2023-10-14 DIAGNOSIS — D631 Anemia in chronic kidney disease: Secondary | ICD-10-CM | POA: Diagnosis not present

## 2023-10-14 DIAGNOSIS — N2581 Secondary hyperparathyroidism of renal origin: Secondary | ICD-10-CM | POA: Diagnosis not present

## 2023-10-14 DIAGNOSIS — E1151 Type 2 diabetes mellitus with diabetic peripheral angiopathy without gangrene: Secondary | ICD-10-CM | POA: Diagnosis not present

## 2023-10-14 DIAGNOSIS — L97412 Non-pressure chronic ulcer of right heel and midfoot with fat layer exposed: Secondary | ICD-10-CM | POA: Diagnosis not present

## 2023-10-14 DIAGNOSIS — I132 Hypertensive heart and chronic kidney disease with heart failure and with stage 5 chronic kidney disease, or end stage renal disease: Secondary | ICD-10-CM | POA: Diagnosis not present

## 2023-10-14 DIAGNOSIS — E1122 Type 2 diabetes mellitus with diabetic chronic kidney disease: Secondary | ICD-10-CM | POA: Diagnosis not present

## 2023-10-14 DIAGNOSIS — E113411 Type 2 diabetes mellitus with severe nonproliferative diabetic retinopathy with macular edema, right eye: Secondary | ICD-10-CM | POA: Diagnosis not present

## 2023-10-14 DIAGNOSIS — D689 Coagulation defect, unspecified: Secondary | ICD-10-CM | POA: Diagnosis not present

## 2023-10-14 DIAGNOSIS — R197 Diarrhea, unspecified: Secondary | ICD-10-CM | POA: Diagnosis not present

## 2023-10-14 DIAGNOSIS — Z992 Dependence on renal dialysis: Secondary | ICD-10-CM | POA: Diagnosis not present

## 2023-10-14 DIAGNOSIS — R52 Pain, unspecified: Secondary | ICD-10-CM | POA: Diagnosis not present

## 2023-10-14 DIAGNOSIS — I5032 Chronic diastolic (congestive) heart failure: Secondary | ICD-10-CM | POA: Diagnosis not present

## 2023-10-14 DIAGNOSIS — E11621 Type 2 diabetes mellitus with foot ulcer: Secondary | ICD-10-CM | POA: Diagnosis not present

## 2023-10-14 DIAGNOSIS — E1129 Type 2 diabetes mellitus with other diabetic kidney complication: Secondary | ICD-10-CM | POA: Diagnosis not present

## 2023-10-14 DIAGNOSIS — E1140 Type 2 diabetes mellitus with diabetic neuropathy, unspecified: Secondary | ICD-10-CM | POA: Diagnosis not present

## 2023-10-14 DIAGNOSIS — D509 Iron deficiency anemia, unspecified: Secondary | ICD-10-CM | POA: Diagnosis not present

## 2023-10-14 DIAGNOSIS — I151 Hypertension secondary to other renal disorders: Secondary | ICD-10-CM | POA: Diagnosis not present

## 2023-10-14 DIAGNOSIS — E46 Unspecified protein-calorie malnutrition: Secondary | ICD-10-CM | POA: Diagnosis not present

## 2023-10-15 ENCOUNTER — Telehealth (HOSPITAL_COMMUNITY): Payer: Self-pay | Admitting: *Deleted

## 2023-10-15 ENCOUNTER — Ambulatory Visit: Payer: Medicare Other | Admitting: Orthopedic Surgery

## 2023-10-15 DIAGNOSIS — Z89512 Acquired absence of left leg below knee: Secondary | ICD-10-CM | POA: Diagnosis not present

## 2023-10-15 NOTE — Telephone Encounter (Signed)
Received fax from [facility] requesting evaluation due to prolonged bleeding post treatment.  Will give to [facility] and scan fax into media.

## 2023-10-15 NOTE — Addendum Note (Signed)
Addended by: Primitivo Gauze on: 10/15/2023 04:46 PM   Modules accepted: Orders

## 2023-10-16 DIAGNOSIS — D689 Coagulation defect, unspecified: Secondary | ICD-10-CM | POA: Diagnosis not present

## 2023-10-16 DIAGNOSIS — I151 Hypertension secondary to other renal disorders: Secondary | ICD-10-CM | POA: Diagnosis not present

## 2023-10-16 DIAGNOSIS — R52 Pain, unspecified: Secondary | ICD-10-CM | POA: Diagnosis not present

## 2023-10-16 DIAGNOSIS — E1129 Type 2 diabetes mellitus with other diabetic kidney complication: Secondary | ICD-10-CM | POA: Diagnosis not present

## 2023-10-16 DIAGNOSIS — D631 Anemia in chronic kidney disease: Secondary | ICD-10-CM | POA: Diagnosis not present

## 2023-10-16 DIAGNOSIS — D509 Iron deficiency anemia, unspecified: Secondary | ICD-10-CM | POA: Diagnosis not present

## 2023-10-16 DIAGNOSIS — R197 Diarrhea, unspecified: Secondary | ICD-10-CM | POA: Diagnosis not present

## 2023-10-16 DIAGNOSIS — N2581 Secondary hyperparathyroidism of renal origin: Secondary | ICD-10-CM | POA: Diagnosis not present

## 2023-10-16 DIAGNOSIS — N186 End stage renal disease: Secondary | ICD-10-CM | POA: Diagnosis not present

## 2023-10-16 DIAGNOSIS — Z992 Dependence on renal dialysis: Secondary | ICD-10-CM | POA: Diagnosis not present

## 2023-10-17 DIAGNOSIS — L97412 Non-pressure chronic ulcer of right heel and midfoot with fat layer exposed: Secondary | ICD-10-CM | POA: Diagnosis not present

## 2023-10-17 DIAGNOSIS — H35033 Hypertensive retinopathy, bilateral: Secondary | ICD-10-CM | POA: Diagnosis not present

## 2023-10-17 DIAGNOSIS — M810 Age-related osteoporosis without current pathological fracture: Secondary | ICD-10-CM | POA: Diagnosis not present

## 2023-10-17 DIAGNOSIS — I2510 Atherosclerotic heart disease of native coronary artery without angina pectoris: Secondary | ICD-10-CM | POA: Diagnosis not present

## 2023-10-17 DIAGNOSIS — I5032 Chronic diastolic (congestive) heart failure: Secondary | ICD-10-CM | POA: Diagnosis not present

## 2023-10-17 DIAGNOSIS — D631 Anemia in chronic kidney disease: Secondary | ICD-10-CM | POA: Diagnosis not present

## 2023-10-17 DIAGNOSIS — E1122 Type 2 diabetes mellitus with diabetic chronic kidney disease: Secondary | ICD-10-CM | POA: Diagnosis not present

## 2023-10-17 DIAGNOSIS — I480 Paroxysmal atrial fibrillation: Secondary | ICD-10-CM | POA: Diagnosis not present

## 2023-10-17 DIAGNOSIS — M159 Polyosteoarthritis, unspecified: Secondary | ICD-10-CM | POA: Diagnosis not present

## 2023-10-17 DIAGNOSIS — E1151 Type 2 diabetes mellitus with diabetic peripheral angiopathy without gangrene: Secondary | ICD-10-CM | POA: Diagnosis not present

## 2023-10-17 DIAGNOSIS — M797 Fibromyalgia: Secondary | ICD-10-CM | POA: Diagnosis not present

## 2023-10-17 DIAGNOSIS — Z89511 Acquired absence of right leg below knee: Secondary | ICD-10-CM | POA: Diagnosis not present

## 2023-10-17 DIAGNOSIS — E113411 Type 2 diabetes mellitus with severe nonproliferative diabetic retinopathy with macular edema, right eye: Secondary | ICD-10-CM | POA: Diagnosis not present

## 2023-10-17 DIAGNOSIS — I132 Hypertensive heart and chronic kidney disease with heart failure and with stage 5 chronic kidney disease, or end stage renal disease: Secondary | ICD-10-CM | POA: Diagnosis not present

## 2023-10-17 DIAGNOSIS — E46 Unspecified protein-calorie malnutrition: Secondary | ICD-10-CM | POA: Diagnosis not present

## 2023-10-17 DIAGNOSIS — J302 Other seasonal allergic rhinitis: Secondary | ICD-10-CM | POA: Diagnosis not present

## 2023-10-17 DIAGNOSIS — Z992 Dependence on renal dialysis: Secondary | ICD-10-CM | POA: Diagnosis not present

## 2023-10-17 DIAGNOSIS — N186 End stage renal disease: Secondary | ICD-10-CM | POA: Diagnosis not present

## 2023-10-17 DIAGNOSIS — E11621 Type 2 diabetes mellitus with foot ulcer: Secondary | ICD-10-CM | POA: Diagnosis not present

## 2023-10-17 DIAGNOSIS — I700 Atherosclerosis of aorta: Secondary | ICD-10-CM | POA: Diagnosis not present

## 2023-10-17 DIAGNOSIS — E1140 Type 2 diabetes mellitus with diabetic neuropathy, unspecified: Secondary | ICD-10-CM | POA: Diagnosis not present

## 2023-10-18 DIAGNOSIS — J302 Other seasonal allergic rhinitis: Secondary | ICD-10-CM | POA: Diagnosis not present

## 2023-10-18 DIAGNOSIS — I700 Atherosclerosis of aorta: Secondary | ICD-10-CM | POA: Diagnosis not present

## 2023-10-18 DIAGNOSIS — M159 Polyosteoarthritis, unspecified: Secondary | ICD-10-CM | POA: Diagnosis not present

## 2023-10-18 DIAGNOSIS — I2510 Atherosclerotic heart disease of native coronary artery without angina pectoris: Secondary | ICD-10-CM | POA: Diagnosis not present

## 2023-10-18 DIAGNOSIS — E1140 Type 2 diabetes mellitus with diabetic neuropathy, unspecified: Secondary | ICD-10-CM | POA: Diagnosis not present

## 2023-10-18 DIAGNOSIS — N2581 Secondary hyperparathyroidism of renal origin: Secondary | ICD-10-CM | POA: Diagnosis not present

## 2023-10-18 DIAGNOSIS — I151 Hypertension secondary to other renal disorders: Secondary | ICD-10-CM | POA: Diagnosis not present

## 2023-10-18 DIAGNOSIS — E11621 Type 2 diabetes mellitus with foot ulcer: Secondary | ICD-10-CM | POA: Diagnosis not present

## 2023-10-18 DIAGNOSIS — D631 Anemia in chronic kidney disease: Secondary | ICD-10-CM | POA: Diagnosis not present

## 2023-10-18 DIAGNOSIS — E1151 Type 2 diabetes mellitus with diabetic peripheral angiopathy without gangrene: Secondary | ICD-10-CM | POA: Diagnosis not present

## 2023-10-18 DIAGNOSIS — I480 Paroxysmal atrial fibrillation: Secondary | ICD-10-CM | POA: Diagnosis not present

## 2023-10-18 DIAGNOSIS — E46 Unspecified protein-calorie malnutrition: Secondary | ICD-10-CM | POA: Diagnosis not present

## 2023-10-18 DIAGNOSIS — I132 Hypertensive heart and chronic kidney disease with heart failure and with stage 5 chronic kidney disease, or end stage renal disease: Secondary | ICD-10-CM | POA: Diagnosis not present

## 2023-10-18 DIAGNOSIS — E113411 Type 2 diabetes mellitus with severe nonproliferative diabetic retinopathy with macular edema, right eye: Secondary | ICD-10-CM | POA: Diagnosis not present

## 2023-10-18 DIAGNOSIS — H35033 Hypertensive retinopathy, bilateral: Secondary | ICD-10-CM | POA: Diagnosis not present

## 2023-10-18 DIAGNOSIS — I5032 Chronic diastolic (congestive) heart failure: Secondary | ICD-10-CM | POA: Diagnosis not present

## 2023-10-18 DIAGNOSIS — E1122 Type 2 diabetes mellitus with diabetic chronic kidney disease: Secondary | ICD-10-CM | POA: Diagnosis not present

## 2023-10-18 DIAGNOSIS — M797 Fibromyalgia: Secondary | ICD-10-CM | POA: Diagnosis not present

## 2023-10-18 DIAGNOSIS — Z992 Dependence on renal dialysis: Secondary | ICD-10-CM | POA: Diagnosis not present

## 2023-10-18 DIAGNOSIS — L97412 Non-pressure chronic ulcer of right heel and midfoot with fat layer exposed: Secondary | ICD-10-CM | POA: Diagnosis not present

## 2023-10-18 DIAGNOSIS — Z89511 Acquired absence of right leg below knee: Secondary | ICD-10-CM | POA: Diagnosis not present

## 2023-10-18 DIAGNOSIS — M810 Age-related osteoporosis without current pathological fracture: Secondary | ICD-10-CM | POA: Diagnosis not present

## 2023-10-18 DIAGNOSIS — E1129 Type 2 diabetes mellitus with other diabetic kidney complication: Secondary | ICD-10-CM | POA: Diagnosis not present

## 2023-10-18 DIAGNOSIS — R52 Pain, unspecified: Secondary | ICD-10-CM | POA: Diagnosis not present

## 2023-10-18 DIAGNOSIS — D509 Iron deficiency anemia, unspecified: Secondary | ICD-10-CM | POA: Diagnosis not present

## 2023-10-18 DIAGNOSIS — D689 Coagulation defect, unspecified: Secondary | ICD-10-CM | POA: Diagnosis not present

## 2023-10-18 DIAGNOSIS — N186 End stage renal disease: Secondary | ICD-10-CM | POA: Diagnosis not present

## 2023-10-18 DIAGNOSIS — R197 Diarrhea, unspecified: Secondary | ICD-10-CM | POA: Diagnosis not present

## 2023-10-18 NOTE — Addendum Note (Signed)
Addended by: Primitivo Gauze on: 10/18/2023 04:47 PM   Modules accepted: Orders

## 2023-10-19 ENCOUNTER — Other Ambulatory Visit: Payer: Self-pay

## 2023-10-19 ENCOUNTER — Encounter (HOSPITAL_COMMUNITY): Payer: Self-pay

## 2023-10-19 ENCOUNTER — Emergency Department (HOSPITAL_COMMUNITY): Payer: Medicare Other

## 2023-10-19 ENCOUNTER — Inpatient Hospital Stay (HOSPITAL_COMMUNITY)
Admission: EM | Admit: 2023-10-19 | Discharge: 2023-10-23 | DRG: 871 | Disposition: A | Discharge status: Home / Self Care | Payer: Medicare Other | Attending: Internal Medicine | Admitting: Internal Medicine

## 2023-10-19 DIAGNOSIS — E11621 Type 2 diabetes mellitus with foot ulcer: Secondary | ICD-10-CM | POA: Diagnosis not present

## 2023-10-19 DIAGNOSIS — E1152 Type 2 diabetes mellitus with diabetic peripheral angiopathy with gangrene: Secondary | ICD-10-CM | POA: Diagnosis present

## 2023-10-19 DIAGNOSIS — R6510 Systemic inflammatory response syndrome (SIRS) of non-infectious origin without acute organ dysfunction: Secondary | ICD-10-CM | POA: Diagnosis not present

## 2023-10-19 DIAGNOSIS — H409 Unspecified glaucoma: Secondary | ICD-10-CM | POA: Diagnosis present

## 2023-10-19 DIAGNOSIS — L03115 Cellulitis of right lower limb: Secondary | ICD-10-CM | POA: Diagnosis not present

## 2023-10-19 DIAGNOSIS — I96 Gangrene, not elsewhere classified: Secondary | ICD-10-CM | POA: Diagnosis not present

## 2023-10-19 DIAGNOSIS — R6889 Other general symptoms and signs: Secondary | ICD-10-CM | POA: Diagnosis not present

## 2023-10-19 DIAGNOSIS — E11649 Type 2 diabetes mellitus with hypoglycemia without coma: Secondary | ICD-10-CM | POA: Diagnosis not present

## 2023-10-19 DIAGNOSIS — N186 End stage renal disease: Secondary | ICD-10-CM | POA: Diagnosis not present

## 2023-10-19 DIAGNOSIS — A4189 Other specified sepsis: Secondary | ICD-10-CM | POA: Diagnosis not present

## 2023-10-19 DIAGNOSIS — Z635 Disruption of family by separation and divorce: Secondary | ICD-10-CM

## 2023-10-19 DIAGNOSIS — D696 Thrombocytopenia, unspecified: Secondary | ICD-10-CM | POA: Diagnosis present

## 2023-10-19 DIAGNOSIS — R197 Diarrhea, unspecified: Secondary | ICD-10-CM | POA: Diagnosis not present

## 2023-10-19 DIAGNOSIS — R110 Nausea: Secondary | ICD-10-CM | POA: Diagnosis not present

## 2023-10-19 DIAGNOSIS — I132 Hypertensive heart and chronic kidney disease with heart failure and with stage 5 chronic kidney disease, or end stage renal disease: Secondary | ICD-10-CM | POA: Diagnosis present

## 2023-10-19 DIAGNOSIS — I252 Old myocardial infarction: Secondary | ICD-10-CM

## 2023-10-19 DIAGNOSIS — Z992 Dependence on renal dialysis: Secondary | ICD-10-CM

## 2023-10-19 DIAGNOSIS — E1142 Type 2 diabetes mellitus with diabetic polyneuropathy: Secondary | ICD-10-CM | POA: Diagnosis not present

## 2023-10-19 DIAGNOSIS — Z89512 Acquired absence of left leg below knee: Secondary | ICD-10-CM | POA: Diagnosis not present

## 2023-10-19 DIAGNOSIS — Z96651 Presence of right artificial knee joint: Secondary | ICD-10-CM | POA: Diagnosis present

## 2023-10-19 DIAGNOSIS — Z833 Family history of diabetes mellitus: Secondary | ICD-10-CM

## 2023-10-19 DIAGNOSIS — L97411 Non-pressure chronic ulcer of right heel and midfoot limited to breakdown of skin: Secondary | ICD-10-CM | POA: Diagnosis not present

## 2023-10-19 DIAGNOSIS — U071 COVID-19: Secondary | ICD-10-CM | POA: Diagnosis not present

## 2023-10-19 DIAGNOSIS — D72819 Decreased white blood cell count, unspecified: Secondary | ICD-10-CM | POA: Diagnosis not present

## 2023-10-19 DIAGNOSIS — I4819 Other persistent atrial fibrillation: Secondary | ICD-10-CM | POA: Diagnosis present

## 2023-10-19 DIAGNOSIS — Z794 Long term (current) use of insulin: Secondary | ICD-10-CM | POA: Diagnosis not present

## 2023-10-19 DIAGNOSIS — Z79899 Other long term (current) drug therapy: Secondary | ICD-10-CM

## 2023-10-19 DIAGNOSIS — G9341 Metabolic encephalopathy: Secondary | ICD-10-CM | POA: Diagnosis not present

## 2023-10-19 DIAGNOSIS — E1122 Type 2 diabetes mellitus with diabetic chronic kidney disease: Secondary | ICD-10-CM | POA: Diagnosis not present

## 2023-10-19 DIAGNOSIS — N400 Enlarged prostate without lower urinary tract symptoms: Secondary | ICD-10-CM | POA: Diagnosis present

## 2023-10-19 DIAGNOSIS — I10 Essential (primary) hypertension: Secondary | ICD-10-CM | POA: Diagnosis not present

## 2023-10-19 DIAGNOSIS — E119 Type 2 diabetes mellitus without complications: Secondary | ICD-10-CM | POA: Diagnosis not present

## 2023-10-19 DIAGNOSIS — M810 Age-related osteoporosis without current pathological fracture: Secondary | ICD-10-CM | POA: Diagnosis present

## 2023-10-19 DIAGNOSIS — E113592 Type 2 diabetes mellitus with proliferative diabetic retinopathy without macular edema, left eye: Secondary | ICD-10-CM | POA: Diagnosis not present

## 2023-10-19 DIAGNOSIS — K219 Gastro-esophageal reflux disease without esophagitis: Secondary | ICD-10-CM | POA: Diagnosis present

## 2023-10-19 DIAGNOSIS — H35033 Hypertensive retinopathy, bilateral: Secondary | ICD-10-CM | POA: Diagnosis present

## 2023-10-19 DIAGNOSIS — I739 Peripheral vascular disease, unspecified: Secondary | ICD-10-CM | POA: Diagnosis not present

## 2023-10-19 DIAGNOSIS — E11628 Type 2 diabetes mellitus with other skin complications: Secondary | ICD-10-CM | POA: Diagnosis not present

## 2023-10-19 DIAGNOSIS — M797 Fibromyalgia: Secondary | ICD-10-CM | POA: Diagnosis present

## 2023-10-19 DIAGNOSIS — R0989 Other specified symptoms and signs involving the circulatory and respiratory systems: Secondary | ICD-10-CM | POA: Diagnosis not present

## 2023-10-19 DIAGNOSIS — I517 Cardiomegaly: Secondary | ICD-10-CM | POA: Diagnosis not present

## 2023-10-19 DIAGNOSIS — F4321 Adjustment disorder with depressed mood: Secondary | ICD-10-CM | POA: Diagnosis present

## 2023-10-19 DIAGNOSIS — I509 Heart failure, unspecified: Secondary | ICD-10-CM | POA: Diagnosis present

## 2023-10-19 DIAGNOSIS — Z743 Need for continuous supervision: Secondary | ICD-10-CM | POA: Diagnosis not present

## 2023-10-19 DIAGNOSIS — E785 Hyperlipidemia, unspecified: Secondary | ICD-10-CM | POA: Diagnosis present

## 2023-10-19 DIAGNOSIS — Z7982 Long term (current) use of aspirin: Secondary | ICD-10-CM

## 2023-10-19 DIAGNOSIS — Z83438 Family history of other disorder of lipoprotein metabolism and other lipidemia: Secondary | ICD-10-CM

## 2023-10-19 DIAGNOSIS — L97419 Non-pressure chronic ulcer of right heel and midfoot with unspecified severity: Secondary | ICD-10-CM | POA: Diagnosis not present

## 2023-10-19 DIAGNOSIS — Z89421 Acquired absence of other right toe(s): Secondary | ICD-10-CM

## 2023-10-19 DIAGNOSIS — E440 Moderate protein-calorie malnutrition: Secondary | ICD-10-CM | POA: Diagnosis present

## 2023-10-19 DIAGNOSIS — R262 Difficulty in walking, not elsewhere classified: Secondary | ICD-10-CM | POA: Diagnosis present

## 2023-10-19 DIAGNOSIS — R918 Other nonspecific abnormal finding of lung field: Secondary | ICD-10-CM | POA: Diagnosis present

## 2023-10-19 DIAGNOSIS — E118 Type 2 diabetes mellitus with unspecified complications: Principal | ICD-10-CM

## 2023-10-19 DIAGNOSIS — Z8249 Family history of ischemic heart disease and other diseases of the circulatory system: Secondary | ICD-10-CM

## 2023-10-19 DIAGNOSIS — L089 Local infection of the skin and subcutaneous tissue, unspecified: Secondary | ICD-10-CM | POA: Diagnosis present

## 2023-10-19 DIAGNOSIS — Z7901 Long term (current) use of anticoagulants: Secondary | ICD-10-CM

## 2023-10-19 DIAGNOSIS — R5381 Other malaise: Secondary | ICD-10-CM | POA: Diagnosis not present

## 2023-10-19 DIAGNOSIS — D631 Anemia in chronic kidney disease: Secondary | ICD-10-CM | POA: Diagnosis not present

## 2023-10-19 DIAGNOSIS — L97519 Non-pressure chronic ulcer of other part of right foot with unspecified severity: Secondary | ICD-10-CM | POA: Diagnosis not present

## 2023-10-19 DIAGNOSIS — E113291 Type 2 diabetes mellitus with mild nonproliferative diabetic retinopathy without macular edema, right eye: Secondary | ICD-10-CM | POA: Diagnosis present

## 2023-10-19 DIAGNOSIS — Z681 Body mass index (BMI) 19 or less, adult: Secondary | ICD-10-CM | POA: Diagnosis not present

## 2023-10-19 DIAGNOSIS — Z9861 Coronary angioplasty status: Secondary | ICD-10-CM

## 2023-10-19 DIAGNOSIS — M1909 Primary osteoarthritis, other specified site: Secondary | ICD-10-CM | POA: Diagnosis present

## 2023-10-19 DIAGNOSIS — E889 Metabolic disorder, unspecified: Secondary | ICD-10-CM | POA: Diagnosis present

## 2023-10-19 DIAGNOSIS — Z87891 Personal history of nicotine dependence: Secondary | ICD-10-CM

## 2023-10-19 DIAGNOSIS — M479 Spondylosis, unspecified: Secondary | ICD-10-CM | POA: Diagnosis present

## 2023-10-19 DIAGNOSIS — I2510 Atherosclerotic heart disease of native coronary artery without angina pectoris: Secondary | ICD-10-CM | POA: Diagnosis present

## 2023-10-19 DIAGNOSIS — Z8701 Personal history of pneumonia (recurrent): Secondary | ICD-10-CM

## 2023-10-19 LAB — I-STAT CG4 LACTIC ACID, ED: Lactic Acid, Venous: 2 mmol/L (ref 0.5–1.9)

## 2023-10-19 MED ORDER — VANCOMYCIN HCL 1.5 G IV SOLR
1500.0000 mg | Freq: Once | INTRAVENOUS | Status: AC
  Administered 2023-10-20: 1500 mg via INTRAVENOUS
  Filled 2023-10-19: qty 30

## 2023-10-19 MED ORDER — SODIUM CHLORIDE 0.9 % IV SOLN
2.0000 g | Freq: Once | INTRAVENOUS | Status: AC
  Administered 2023-10-19: 2 g via INTRAVENOUS
  Filled 2023-10-19: qty 12.5

## 2023-10-19 MED ORDER — VANCOMYCIN HCL IN DEXTROSE 1-5 GM/200ML-% IV SOLN: 1000.0000 mg | Freq: Once | INTRAVENOUS | Status: DC

## 2023-10-19 MED ORDER — SODIUM CHLORIDE 0.9 % IV BOLUS (SEPSIS)
1000.0000 mL | Freq: Once | INTRAVENOUS | Status: AC
  Administered 2023-10-19: 1000 mL via INTRAVENOUS

## 2023-10-19 MED ORDER — METRONIDAZOLE 500 MG/100ML IV SOLN
500.0000 mg | Freq: Once | INTRAVENOUS | Status: AC
  Administered 2023-10-20: 500 mg via INTRAVENOUS
  Filled 2023-10-19: qty 100

## 2023-10-19 NOTE — Progress Notes (Signed)
Pt being followed by [facility] for Sepsis protocol. 

## 2023-10-19 NOTE — Progress Notes (Signed)
ED Pharmacy Antibiotic Sign Off An antibiotic consult was received from an ED provider for vancomycin and cefepime per pharmacy dosing for Sepsis. A chart review was completed to assess appropriateness.   The following one time order(s) were placed:   -Cefepime 2g IV x1 per MD -Vancomycin 1500 mg IV x1  Further antibiotic and/or antibiotic pharmacy consults should be ordered by the admitting provider if indicated.   Thank you for allowing pharmacy to be a part of this patient's care.   Arabella Merles, [facility]  Clinical Pharmacist 10/19/23 11:36 PM

## 2023-10-19 NOTE — ED Provider Notes (Signed)
[place] EMERGENCY DEPARTMENT AT [facility] Provider Note   CSN: [id number] Arrival date & time: 10/19/23  2317     History {Add pertinent medical, surgical, social history, OB history to HPI:1} Chief Complaint  Patient presents with   Nausea   Diarrhea   Wound Check    William Webb is a 72 y.o. male.  Brought to the emergency department from home by ambulance.  Patient has not been feeling well for several days.  He has had cough, congestion, nausea, vomiting and diarrhea.  Patient is a dialysis patient, dialyzes on Monday Wednesday Friday.  He did dialyze yesterday.       Home Medications Prior to Admission medications   Medication Sig Start Date End Date Taking? Authorizing Provider  acetaminophen (TYLENOL) 325 MG tablet Take 1-2 tablets (325-650 mg total) by mouth every 6 (six) hours as needed for mild pain (pain score 1-3 or temp > 100.5). 06/11/23   Zigmund Daniel., MD  apixaban (ELIQUIS) 5 MG TABS tablet Take 1 tablet (5 mg total) by mouth 2 (two) times daily. 01/10/23   Patwardhan, Anabel Bene, MD  aspirin EC 81 MG tablet Take 81 mg by mouth daily. Swallow whole.    [provider]  atorvastatin (LIPITOR) 80 MG tablet Take 80 mg by mouth daily.    [provider]  carvedilol (COREG) 25 MG tablet Take 1 tablet (25 mg total) by mouth 2 (two) times daily. 06/11/23   Zigmund Daniel., MD  ethyl chloride spray Apply 1 Application topically 3 (three) times a week. At dialysis 04/25/23   [provider]  finasteride (PROSCAR) 5 MG tablet Take 5 mg by mouth daily. 11/08/22   [provider]  gabapentin (NEURONTIN) 600 MG tablet Take 0.5 tablets (300 mg total) by mouth 2 (two) times daily. Patient taking differently: Take 600 mg by mouth 2 (two) times daily. 08/02/20   Calvert Cantor, MD  isosorbide mononitrate (IMDUR) 60 MG 24 hr tablet Take 1 tablet (60 mg total) by mouth daily. 08/03/20   Calvert Cantor, MD   lidocaine-prilocaine (EMLA) cream Apply 1 application  topically as needed (prior to port being accessed). 07/05/21   [provider]  linaclotide (LINZESS) 145 MCG CAPS capsule Take 145 mcg by mouth as needed (constipation).    [provider]  loratadine (CLARITIN) 10 MG tablet Take 10 mg by mouth daily as needed for allergies.    [provider]  mirtazapine (REMERON SOL-TAB) 15 MG disintegrating tablet Take 1 tablet (15 mg total) by mouth at bedtime. Patient not taking: Reported on 10/10/2023 06/11/23   Zigmund Daniel., MD  omeprazole (PRILOSEC) 20 MG capsule Take 20 mg by mouth daily.    [provider]  polyethylene glycol powder (GLYCOLAX/MIRALAX) 17 GM/SCOOP powder Take 17 g by mouth in the morning, at noon, and at bedtime. One scoop in beverage of your choice with each meal. You may increase if you continue to be constipated and decrease if your stool becomes too loose. Patient taking differently: Take 17 g by mouth daily as needed for moderate constipation. 02/27/23   Gailen Shelter, PA  sevelamer carbonate (RENVELA) 800 MG tablet Take 2 tablets (1,600 mg total) by mouth 3 (three) times daily with meals. 06/11/23   Zigmund Daniel., MD  tamsulosin (FLOMAX) 0.4 MG CAPS capsule Take 0.4 mg by mouth at bedtime. 11/16/19   [provider]      Allergies  Patient has no known allergies.    Review of Systems   Review of Systems  Physical Exam Updated Vital Signs There were no vitals taken for this visit. Physical Exam Vitals and nursing note reviewed.  Constitutional:      General: He is not in acute distress.    Appearance: He is well-developed.  HENT:     Head: Normocephalic and atraumatic.     Mouth/Throat:     Mouth: Mucous membranes are moist.  Eyes:     General: Vision grossly intact. Gaze aligned appropriately.     Extraocular Movements: Extraocular movements intact.     Conjunctiva/sclera: Conjunctivae normal.   Cardiovascular:     Rate and Rhythm: Normal rate and regular rhythm.     Pulses: Normal pulses.     Heart sounds: Normal heart sounds, S1 normal and S2 normal. No murmur heard.    No friction rub. No gallop.  Pulmonary:     Effort: Pulmonary effort is normal. No respiratory distress.     Breath sounds: Normal breath sounds.  Abdominal:     Palpations: Abdomen is soft.     Tenderness: There is no abdominal tenderness. There is no guarding or rebound.     Hernia: No hernia is present.  Musculoskeletal:        General: No swelling.     Cervical back: Full passive range of motion without pain, normal range of motion and neck supple. No pain with movement, spinous process tenderness or muscular tenderness. Normal range of motion.     Right lower leg: No edema.     Left lower leg: No edema.  Skin:    General: Skin is warm and dry.     Capillary Refill: Capillary refill takes less than 2 seconds.     Findings: Wound (right heel) present. No ecchymosis, erythema or lesion.  Neurological:     Mental Status: He is alert and oriented to person, place, and time.     GCS: GCS eye subscore is 4. GCS verbal subscore is 5. GCS motor subscore is 6.     Cranial Nerves: Cranial nerves 2-12 are intact.     Sensory: Sensation is intact.     Motor: Motor function is intact. No weakness or abnormal muscle tone.     Coordination: Coordination is intact.  Psychiatric:        Mood and Affect: Mood normal.        Speech: Speech normal.        Behavior: Behavior normal.     ED Results / Procedures / Treatments   Labs (all labs ordered are listed, but only abnormal results are displayed) Labs Reviewed  RESP PANEL BY RT-PCR (RSV, FLU A&B, COVID)  RVPGX2  CULTURE, BLOOD (ROUTINE X 2)  CULTURE, BLOOD (ROUTINE X 2)  COMPREHENSIVE METABOLIC PANEL  CBC WITH DIFFERENTIAL/PLATELET  PROTIME-INR  APTT  I-STAT CG4 LACTIC ACID, ED    EKG None  Radiology No results found.  Procedures Procedures   {Document cardiac monitor, telemetry assessment procedure when appropriate:1}  Medications Ordered in ED Medications  sodium chloride 0.9 % bolus 1,000 mL (has no administration in time range)  ceFEPIme (MAXIPIME) 2 g in sodium chloride 0.9 % 100 mL IVPB (has no administration in time range)  metroNIDAZOLE (FLAGYL) IVPB 500 mg (has no administration in time range)  vancomycin (VANCOCIN) IVPB 1000 mg/200 mL premix (has no administration in time range)    ED Course/ Medical Decision Making/ A&P   {  Click here for ABCD2, HEART and other calculatorsREFRESH Note before signing :1}                              Medical Decision Making Amount and/or Complexity of Data Reviewed Labs: ordered. Radiology: ordered. ECG/medicine tests: ordered.  Risk Prescription drug management.   ***  {Document critical care time when appropriate:1} {Document review of labs and clinical decision tools ie heart score, Chads2Vasc2 etc:1}  {Document your independent review of radiology images, and any outside records:1} {Document your discussion with family members, caretakers, and with consultants:1} {Document social determinants of health affecting pt's care:1} {Document your decision making why or why not admission, treatments were needed:1} Final Clinical Impression(s) / ED Diagnoses Final diagnoses:  None    Rx / DC Orders ED Discharge Orders     None

## 2023-10-19 NOTE — ED Triage Notes (Addendum)
Pt arrived from home via [facility] c/o diarrhea, nausea, and just feeling ill for the past 3 days. Pt has open wound to right foot. Dialysis M/W/F EMS VS B/P 106/70 P 96 RR 18 CBG 160

## 2023-10-20 ENCOUNTER — Encounter (HOSPITAL_COMMUNITY): Payer: Medicare Other

## 2023-10-20 ENCOUNTER — Emergency Department (HOSPITAL_COMMUNITY): Payer: Medicare Other

## 2023-10-20 ENCOUNTER — Observation Stay (HOSPITAL_COMMUNITY): Payer: Medicare Other

## 2023-10-20 DIAGNOSIS — Z794 Long term (current) use of insulin: Secondary | ICD-10-CM

## 2023-10-20 DIAGNOSIS — I739 Peripheral vascular disease, unspecified: Secondary | ICD-10-CM

## 2023-10-20 DIAGNOSIS — I10 Essential (primary) hypertension: Secondary | ICD-10-CM

## 2023-10-20 DIAGNOSIS — Z89421 Acquired absence of other right toe(s): Secondary | ICD-10-CM | POA: Diagnosis not present

## 2023-10-20 DIAGNOSIS — U071 COVID-19: Secondary | ICD-10-CM

## 2023-10-20 DIAGNOSIS — L03115 Cellulitis of right lower limb: Secondary | ICD-10-CM | POA: Diagnosis not present

## 2023-10-20 DIAGNOSIS — L089 Local infection of the skin and subcutaneous tissue, unspecified: Secondary | ICD-10-CM | POA: Diagnosis not present

## 2023-10-20 DIAGNOSIS — R6510 Systemic inflammatory response syndrome (SIRS) of non-infectious origin without acute organ dysfunction: Secondary | ICD-10-CM

## 2023-10-20 DIAGNOSIS — E11628 Type 2 diabetes mellitus with other skin complications: Secondary | ICD-10-CM

## 2023-10-20 DIAGNOSIS — N186 End stage renal disease: Secondary | ICD-10-CM

## 2023-10-20 DIAGNOSIS — E119 Type 2 diabetes mellitus without complications: Secondary | ICD-10-CM

## 2023-10-20 DIAGNOSIS — L97411 Non-pressure chronic ulcer of right heel and midfoot limited to breakdown of skin: Secondary | ICD-10-CM | POA: Diagnosis not present

## 2023-10-20 LAB — CBC WITH DIFFERENTIAL/PLATELET
Abs Immature Granulocytes: 0.01 10*3/uL (ref 0.00–0.07)
Basophils Absolute: 0 10*3/uL (ref 0.0–0.1)
Basophils Relative: 0 %
Eosinophils Absolute: 0 10*3/uL (ref 0.0–0.5)
Eosinophils Relative: 0 %
HCT: 38.7 % — ABNORMAL LOW (ref 39.0–52.0)
Hemoglobin: 11.7 g/dL — ABNORMAL LOW (ref 13.0–17.0)
Immature Granulocytes: 0 %
Lymphocytes Relative: 15 %
Lymphs Abs: 0.4 10*3/uL — ABNORMAL LOW (ref 0.7–4.0)
MCH: 28.7 pg (ref 26.0–34.0)
MCHC: 30.2 g/dL (ref 30.0–36.0)
MCV: 95.1 fL (ref 80.0–100.0)
Monocytes Absolute: 0.4 10*3/uL (ref 0.1–1.0)
Monocytes Relative: 13 %
Neutro Abs: 2.1 10*3/uL (ref 1.7–7.7)
Neutrophils Relative %: 72 %
Platelets: 68 10*3/uL — ABNORMAL LOW (ref 150–400)
RBC: 4.07 MIL/uL — ABNORMAL LOW (ref 4.22–5.81)
RDW: 16.4 % — ABNORMAL HIGH (ref 11.5–15.5)
WBC: 3 10*3/uL — ABNORMAL LOW (ref 4.0–10.5)
nRBC: 0 % (ref 0.0–0.2)

## 2023-10-20 LAB — RESP PANEL BY RT-PCR (RSV, FLU A&B, COVID)  RVPGX2
Influenza A by PCR: NEGATIVE
Influenza B by PCR: NEGATIVE
Resp Syncytial Virus by PCR: NEGATIVE
SARS Coronavirus 2 by RT PCR: POSITIVE — AB

## 2023-10-20 LAB — COMPREHENSIVE METABOLIC PANEL
ALT: 23 U/L (ref 0–44)
AST: 50 U/L — ABNORMAL HIGH (ref 15–41)
Albumin: 3.1 g/dL — ABNORMAL LOW (ref 3.5–5.0)
Alkaline Phosphatase: 120 U/L (ref 38–126)
Anion gap: 14 (ref 5–15)
BUN: 37 mg/dL — ABNORMAL HIGH (ref 8–23)
CO2: 26 mmol/L (ref 22–32)
Calcium: 8.4 mg/dL — ABNORMAL LOW (ref 8.9–10.3)
Chloride: 96 mmol/L — ABNORMAL LOW (ref 98–111)
Creatinine, Ser: 5.37 mg/dL — ABNORMAL HIGH (ref 0.61–1.24)
GFR, Estimated: 11 mL/min — ABNORMAL LOW (ref >60)
Glucose, Bld: 125 mg/dL — ABNORMAL HIGH (ref 70–99)
Potassium: 4.5 mmol/L (ref 3.5–5.1)
Sodium: 136 mmol/L (ref 135–145)
Total Bilirubin: 0.8 mg/dL (ref <1.2)
Total Protein: 7.4 g/dL (ref 6.5–8.1)

## 2023-10-20 LAB — PROTIME-INR
INR: 1.3 — ABNORMAL HIGH (ref 0.8–1.2)
Prothrombin Time: 16.7 s — ABNORMAL HIGH (ref 11.4–15.2)

## 2023-10-20 LAB — I-STAT CG4 LACTIC ACID, ED: Lactic Acid, Venous: 2.2 mmol/L (ref 0.5–1.9)

## 2023-10-20 LAB — CBG MONITORING, ED
Glucose-Capillary: 84 mg/dL (ref 70–99)
Glucose-Capillary: 73 mg/dL (ref 70–99)

## 2023-10-20 LAB — SEDIMENTATION RATE: Sed Rate: 11 mm/h (ref 0–16)

## 2023-10-20 LAB — PREALBUMIN: Prealbumin: 18 mg/dL (ref 18–38)

## 2023-10-20 LAB — APTT: aPTT: 34 s (ref 24–36)

## 2023-10-20 LAB — PHOSPHORUS: Phosphorus: 5.4 mg/dL — ABNORMAL HIGH (ref 2.5–4.6)

## 2023-10-20 LAB — C-REACTIVE PROTEIN: CRP: 5.3 mg/dL — ABNORMAL HIGH (ref <1.0)

## 2023-10-20 LAB — PROCALCITONIN: Procalcitonin: 1.55 ng/mL

## 2023-10-20 LAB — GLUCOSE, CAPILLARY: Glucose-Capillary: 116 mg/dL — ABNORMAL HIGH (ref 70–99)

## 2023-10-20 MED ORDER — ATORVASTATIN CALCIUM 80 MG PO TABS
80.0000 mg | ORAL_TABLET | Freq: Every day | ORAL | Status: DC
Start: 2023-10-20 — End: 2023-10-23
  Administered 2023-10-20 – 2023-10-23 (×4): 80 mg via ORAL
  Filled 2023-10-20: qty 1
  Filled 2023-10-20: qty 2
  Filled 2023-10-20 (×2): qty 1

## 2023-10-20 MED ORDER — POLYETHYLENE GLYCOL 3350 17 G PO PACK: 17.0000 g | PACK | Freq: Every day | ORAL | Status: DC | PRN

## 2023-10-20 MED ORDER — LORAZEPAM 0.5 MG PO TABS: 0.5000 mg | ORAL_TABLET | ORAL | Status: DC | PRN

## 2023-10-20 MED ORDER — METRONIDAZOLE 500 MG PO TABS
500.0000 mg | ORAL_TABLET | Freq: Two times a day (BID) | ORAL | Status: DC
Start: 2023-10-20 — End: 2023-10-27
  Administered 2023-10-20 (×2): 500 mg via ORAL
  Filled 2023-10-20 (×2): qty 1

## 2023-10-20 MED ORDER — FINASTERIDE 5 MG PO TABS
5.0000 mg | ORAL_TABLET | Freq: Every day | ORAL | Status: DC
  Administered 2023-10-21: 5 mg via ORAL
  Filled 2023-10-20 (×2): qty 1

## 2023-10-20 MED ORDER — SEVELAMER CARBONATE 800 MG PO TABS
1600.0000 mg | ORAL_TABLET | Freq: Three times a day (TID) | ORAL | Status: DC
  Administered 2023-10-20 – 2023-10-23 (×9): 1600 mg via ORAL
  Filled 2023-10-20 (×9): qty 2

## 2023-10-20 MED ORDER — ONDANSETRON HCL 4 MG/2ML IJ SOLN: 4.0000 mg | Freq: Four times a day (QID) | INTRAMUSCULAR | Status: DC | PRN

## 2023-10-20 MED ORDER — ACETAMINOPHEN 325 MG PO TABS
650.0000 mg | ORAL_TABLET | Freq: Four times a day (QID) | ORAL | Status: DC | PRN
  Administered 2023-10-20 – 2023-10-23 (×2): 650 mg via ORAL
  Filled 2023-10-20: qty 2

## 2023-10-20 MED ORDER — APIXABAN 5 MG PO TABS
5.0000 mg | ORAL_TABLET | Freq: Two times a day (BID) | ORAL | Status: DC
  Administered 2023-10-20 – 2023-10-23 (×7): 5 mg via ORAL
  Filled 2023-10-20 (×7): qty 1

## 2023-10-20 MED ORDER — ISOSORBIDE MONONITRATE ER 60 MG PO TB24
60.0000 mg | ORAL_TABLET | Freq: Every day | ORAL | Status: DC
  Administered 2023-10-20 – 2023-10-21 (×2): 60 mg via ORAL
  Filled 2023-10-20: qty 1
  Filled 2023-10-20: qty 2

## 2023-10-20 MED ORDER — HEPARIN SODIUM (PORCINE) 5000 UNIT/ML IJ SOLN: 5000.0000 [IU] | Freq: Three times a day (TID) | INTRAMUSCULAR | Status: DC

## 2023-10-20 MED ORDER — ACETAMINOPHEN 650 MG RE SUPP: 650.0000 mg | Freq: Four times a day (QID) | RECTAL | Status: DC | PRN

## 2023-10-20 MED ORDER — VANCOMYCIN HCL 750 MG/150ML IV SOLN
750.0000 mg | Freq: Once | INTRAVENOUS | Status: AC
  Administered 2023-10-20: 750 mg via INTRAVENOUS
  Filled 2023-10-20: qty 150

## 2023-10-20 MED ORDER — PANTOPRAZOLE SODIUM 40 MG PO TBEC
40.0000 mg | DELAYED_RELEASE_TABLET | Freq: Every day | ORAL | Status: DC
  Administered 2023-10-20 – 2023-10-23 (×4): 40 mg via ORAL
  Filled 2023-10-20 (×4): qty 1

## 2023-10-20 MED ORDER — GABAPENTIN 300 MG PO CAPS
600.0000 mg | ORAL_CAPSULE | Freq: Two times a day (BID) | ORAL | Status: DC
  Administered 2023-10-20 – 2023-10-23 (×7): 600 mg via ORAL
  Filled 2023-10-20 (×7): qty 2

## 2023-10-20 MED ORDER — ACETAMINOPHEN 325 MG PO TABS
650.0000 mg | ORAL_TABLET | Freq: Once | ORAL | Status: AC
Start: 2023-10-20 — End: 2023-10-20
  Administered 2023-10-20: 650 mg via ORAL
  Filled 2023-10-20: qty 2

## 2023-10-20 MED ORDER — SODIUM CHLORIDE 0.9 % IV SOLN
2.0000 g | INTRAVENOUS | Status: DC
  Administered 2023-10-20: 2 g via INTRAVENOUS
  Filled 2023-10-20: qty 20

## 2023-10-20 MED ORDER — INSULIN ASPART 100 UNIT/ML IJ SOLN
0.0000 [IU] | Freq: Three times a day (TID) | INTRAMUSCULAR | Status: DC
  Administered 2023-10-21: 1 [IU] via SUBCUTANEOUS
  Administered 2023-10-22: 7 [IU] via SUBCUTANEOUS

## 2023-10-20 MED ORDER — CHLORHEXIDINE GLUCONATE CLOTH 2 % EX PADS
6.0000 | MEDICATED_PAD | Freq: Every day | CUTANEOUS | Status: DC
  Administered 2023-10-21 – 2023-10-23 (×3): 6 via TOPICAL

## 2023-10-20 MED ORDER — CARVEDILOL 25 MG PO TABS
25.0000 mg | ORAL_TABLET | Freq: Two times a day (BID) | ORAL | Status: DC
  Administered 2023-10-20 – 2023-10-21 (×3): 25 mg via ORAL
  Filled 2023-10-20 (×2): qty 1
  Filled 2023-10-20: qty 2

## 2023-10-20 MED ORDER — TAMSULOSIN HCL 0.4 MG PO CAPS
0.4000 mg | ORAL_CAPSULE | Freq: Every day | ORAL | Status: DC
  Administered 2023-10-20 – 2023-10-22 (×3): 0.4 mg via ORAL
  Filled 2023-10-20 (×3): qty 1

## 2023-10-20 MED ORDER — ASPIRIN 81 MG PO TBEC
81.0000 mg | DELAYED_RELEASE_TABLET | Freq: Every day | ORAL | Status: DC
  Administered 2023-10-20 – 2023-10-23 (×4): 81 mg via ORAL
  Filled 2023-10-20 (×4): qty 1

## 2023-10-20 MED ORDER — VANCOMYCIN VARIABLE DOSE PER UNSTABLE RENAL FUNCTION (PHARMACIST DOSING)
Status: DC
Start: 2023-10-20 — End: 2023-10-21

## 2023-10-20 MED ORDER — LORATADINE 10 MG PO TABS: 10.0000 mg | ORAL_TABLET | Freq: Every day | ORAL | Status: DC | PRN

## 2023-10-20 MED ORDER — ONDANSETRON HCL 4 MG PO TABS: 4.0000 mg | ORAL_TABLET | Freq: Four times a day (QID) | ORAL | Status: DC | PRN

## 2023-10-20 NOTE — Assessment & Plan Note (Signed)
Despite fever and acute illness, BP remains high in ED. Cont home BP meds once med rec completed.

## 2023-10-20 NOTE — ED Notes (Signed)
 CCMD notified. Pt on monitor.

## 2023-10-20 NOTE — Assessment & Plan Note (Addendum)
Sensitive SSI AC for the moment.

## 2023-10-20 NOTE — Assessment & Plan Note (Signed)
Call nephro in AM for routine IP dialysis during stay.

## 2023-10-20 NOTE — Progress Notes (Signed)
Pharmacy Antibiotic Note  William Webb is a 72 y.o. male admitted on 10/19/2023 with  diabetic wound infection .  Pharmacy has been consulted for vancomycin dosing.  Plan: Vancomycin 750mg  IV every HD.  Goal pre-HD level 15-25 mcg/mL. Also started on ceftriaxone and metronidazole per admitting DO.  Height: 6\' 3"  (190.5 cm) Weight: 71 kg (156 lb 8.4 oz) IBW/kg (Calculated) : 84.5  Temp (24hrs), Avg:100.8 F (38.2 C), Min:99.1 F (37.3 C), Max:101.9 F (38.8 C)  Recent Labs  Lab 10/19/23 2324 10/19/23 2344 10/20/23 0148 10/20/23 0156  WBC 3.0*  --   --   --   CREATININE  --   --  5.37*  --   LATICACIDVEN  --  2.0*  --  2.2*    Estimated Creatinine Clearance: 12.3 mL/min (A) (by C-G formula based on SCr of 5.37 mg/dL (H)).    No Known Allergies   Thank you for allowing pharmacy to be a part of this patient's care.  Vernard Gambles, PharmD, BCPS  10/20/2023 4:17 AM
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
## 2023-10-20 NOTE — Assessment & Plan Note (Addendum)
LE wound pathway Empiric rocephin, flagyl, vanc MRSA risk factor = ESRD patient on dialysis Procalcitonin BCx MRI R foot Call Dr. Lajoyce Corners in AM

## 2023-10-20 NOTE — Care Management (Signed)
Transition of Care [facility]) - Inpatient Brief Assessment   Patient Details  Name: William Webb MRN: [id number] Date of Birth: [birth year]  Transition of Care [facility]) CM/SW Contact:    Lockie Pares, RN Phone Number: 10/20/2023, 11:17 AM   Clinical Narrative: 72 yo followed by [facility] for [facility] RN and Social work. Will need renewal  orders, may need PT. Patient states he lives alone and has a walker, he has frequent checks from [facility]. He is having trouble  as he only has one leg working. Confirmed he is active with [facility].  TOC will follow for needs   Transition of Care Asessment: Insurance and Status: Insurance coverage has been reviewed Patient has primary care physician: Yes Home environment has been reviewed: Lives alone with frequent checks Prior level of function:: walker Prior/Current Home Services: Current home services Social Drivers of Health Review: SDOH reviewed no interventions necessary Readmission risk has been reviewed: Yes Transition of care needs: transition of care needs identified, TOC will continue to follow

## 2023-10-20 NOTE — Progress Notes (Addendum)
HOSPITALIST                ROUNDING                 NOTE William Webb [facility] JYN:[id number]  DOB: Sep 04, 1951  DOA: 10/19/2023  PCP: Irven Coe, MD  10/20/2023,7:57 AM   LOS: 0 days      Code Status: Full code From: Home  current Dispo: Unclear     72 year old black male Previous left lower extremity amputation 05/2023 Chronic gangrene/ulcer R lower extremitySeenDr. Vu of [facility]  12/12 wound looks stable.today  previous visit vascular surgery Dr. Lenell Antu- 09/10/2023  ESRD MWF PAF on Eliquis HTN HLD CAD Pulmonary nodules BPH Situational depression  Came to the emergency room not feeling well cough congestion nausea vomiting diarrhea-also foot was oozing more than prior and had been dry  previously-he tells me that he was told that Dr. Juanetta Gosling office to do the daily dressings and he elected do not follow-up with them or do the procedure   Sodium 136 potassium 4.5 BUN 37/5.3 albumin 3.1 Lactic acid 2.0 cycling to 2.2 Procalcitonin 1.55 WBC 3.0 hemoglobin 11.7 platelets 68 INR 1.3  CXR stable cardiopulmonary disease without evidence of active process DG foot no acute bony abnormality no evidence of osteo  COVID was positive-CRP was elevated at 5.3, sed rate was 11, Procalcitonin was 1.55   Plan   sepsis on admission question secondary to COVID more so than right lower extremity wound next 9 wound is not actively draining x-ray does not show osteomyelitis and on exam there is nogross purulence although had some earlier in the week- see pictures below We can continue for now ceftriaxone Flagyl vancomycin and narrow and I will ask Dr. 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  history of left lower extremity BKA -see above discussion  continue gabapentin 300 twice daily  for neuropathy   COVID 19 Thrombocytopenia Thrombocytopenia as well as leukopenia point more to this being viral diathesis--- would watch carefully  asymptomatic  other than labs/diarr--hold off of steroids-if he requires treatment we would start Solu-Medrol 1 mg/kg  ESRD MWF, metabolic bone disease, anemia renal disease Dialyzes MWF with good compliance We will contact nephrology to place on schedule for routine dialysis-- iron stores and hemoglobin seem adequate, check phosphorus with next labs Continue Renvela 1.6 3 times daily   HTN  at home is on Coreg and this has been resumed 25 twice daily   paroxysmal A-fib on Eliquis CHADVASC >4 Cardioverted in 2020 Continue Eliquis 5 twice daily  CAD with prior PCI and NSTEMI 09/2018 but unsuccessful Can continue Imdur 60 daily  BPH Continue Proscar 5 daily, Flomax 0.4 at bedtime May benefit from simplification of regimen as this can cause hypotension additively  Pulmonary nodules Outpatient follow-up  Situational depression-resolved   DVT prophylaxis: Eliquis  Status is: Observation The patient will require care spanning > 2 midnights and should be moved to inpatient because:   Will require improvement-he lives alone at home and if he has surgery may need to go to rehab short-term at least    Subjective: Doing well slightly sleepy taken off oxygen he does not desat no chest pain no fever No chills no rigor Mainly had short of breath when he came in now feels overall better Wound was draining  some over the past week and a half or so but it seems dry to my exam today  Objective + exam Vitals:   10/20/23 0445 10/20/23 0515 10/20/23 0530 10/20/23 0634  BP: 132/82 (!) 145/87 (!) 150/85   Pulse: 82 86 83 80  Resp: 20 20 (!) 21 19  Temp:    99.3 F (37.4 C)  TempSrc:    Oral  SpO2: 100% 100% 100% 100%  Weight:      Height:       Filed Weights   10/19/23 2329  Weight: 71 kg    Examination: Awake coherent no distress  sitting up Some crackles posterolaterally on the left side S1-S2 no murmur 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 that looks good Foot exam on the right side does not seem purulent as below Has left-sided BKA which seems clean    Data Reviewed: reviewed   CBC    Component Value Date/Time   WBC 3.0 (L) 10/19/2023 2324   RBC 4.07 (L) 10/19/2023 2324   HGB 11.7 (L) 10/19/2023 2324   HGB 9.7 (L) 01/06/2020 1430   HCT 38.7 (L) 10/19/2023 2324   PLT 68 (L) 10/19/2023 2324   PLT 152 01/06/2020 1430   MCV 95.1 10/19/2023 2324   MCH 28.7 10/19/2023 2324   MCHC 30.2 10/19/2023 2324   RDW 16.4 (H) 10/19/2023 2324   LYMPHSABS 0.4 (L) 10/19/2023 2324   MONOABS 0.4 10/19/2023 2324   EOSABS 0.0 10/19/2023 2324   BASOSABS 0.0 10/19/2023 2324      Latest Ref Rng & Units 10/20/2023    1:48 AM 06/11/2023    2:29 AM 06/10/2023   12:31 AM  CMP  Glucose 70 - 99 mg/dL 782  956  213   BUN 8 - 23 mg/dL 37  33  58   Creatinine 0.61 - 1.24 mg/dL 0.86  5.78  4.69   Sodium 135 - 145 mmol/L 136  131  133   Potassium 3.5 - 5.1 mmol/L 4.5  3.3  3.2   Chloride 98 - 111 mmol/L 96  93  94   CO2 22 - 32 mmol/L 26  24  21    Calcium 8.9 - 10.3 mg/dL 8.4  7.9  8.2   Total Protein 6.5 - 8.1 g/dL 7.4  6.7    Total Bilirubin <1.2 mg/dL 0.8  0.5    Alkaline Phos 38 - 126 U/L 120  67    AST 15 - 41 U/L 50  18    ALT 0 - 44 U/L 23  7      Scheduled Meds:  apixaban  5 mg Oral BID   aspirin EC  81 mg Oral Daily   atorvastatin  80 mg Oral Daily   carvedilol  25 mg Oral BID   finasteride  5 mg Oral Daily   gabapentin  600 mg Oral BID   insulin aspart  0-9 Units Subcutaneous TID WC   isosorbide mononitrate  60 mg Oral Daily   metroNIDAZOLE  500 mg Oral Q12H   pantoprazole  40 mg Oral Daily   sevelamer carbonate  1,600 mg Oral TID WC   tamsulosin  0.4 mg Oral QHS   vancomycin variable dose per unstable renal function (pharmacist dosing)   Does not apply See admin instructions   Continuous Infusions:  cefTRIAXone  (ROCEPHIN)  IV      Time  60  Rhetta Mura, MD  [facility]

## 2023-10-20 NOTE — Plan of Care (Signed)
  Problem: Education: Goal: Knowledge of risk factors and measures for prevention of condition will improve Outcome: Progressing   Problem: Coping: Goal: Psychosocial and spiritual needs will be supported Outcome: Progressing   Problem: Respiratory: Goal: Will maintain a patent airway Outcome: Progressing Goal: Complications related to the disease process, condition or treatment will be avoided or minimized Outcome: Progressing   Problem: Education: Goal: Ability to describe self-care measures that may prevent or decrease complications (Diabetes Survival Skills Education) will improve Outcome: Progressing Goal: Individualized Educational Video(s) Outcome: Progressing   Problem: Coping: Goal: Ability to adjust to condition or change in health will improve Outcome: Progressing   Problem: Fluid Volume: Goal: Ability to maintain a balanced intake and output will improve Outcome: Progressing   Problem: Health Behavior/Discharge Planning: Goal: Ability to identify and utilize available resources and services will improve Outcome: Progressing Goal: Ability to manage health-related needs will improve Outcome: Progressing   Problem: Metabolic: Goal: Ability to maintain appropriate glucose levels will improve Outcome: Progressing   Problem: Nutritional: Goal: Maintenance of adequate nutrition will improve Outcome: Progressing Goal: Progress toward achieving an optimal weight will improve Outcome: Progressing   Problem: Skin Integrity: Goal: Risk for impaired skin integrity will decrease Outcome: Progressing   Problem: Tissue Perfusion: Goal: Adequacy of tissue perfusion will improve Outcome: Progressing   Problem: Education: Goal: Knowledge of General Education information will improve Description: Including pain rating scale, medication(s)/side effects and non-pharmacologic comfort measures Outcome: Progressing   Problem: Health Behavior/Discharge Planning: Goal:  Ability to manage health-related needs will improve Outcome: Progressing   Problem: Clinical Measurements: Goal: Ability to maintain clinical measurements within normal limits will improve Outcome: Progressing Goal: Will remain free from infection Outcome: Progressing Goal: Diagnostic test results will improve Outcome: Progressing Goal: Respiratory complications will improve Outcome: Progressing Goal: Cardiovascular complication will be avoided Outcome: Progressing   Problem: Activity: Goal: Risk for activity intolerance will decrease Outcome: Progressing   Problem: Nutrition: Goal: Adequate nutrition will be maintained Outcome: Progressing   Problem: Coping: Goal: Level of anxiety will decrease Outcome: Progressing   Problem: Elimination: Goal: Will not experience complications related to bowel motility Outcome: Progressing Goal: Will not experience complications related to urinary retention Outcome: Progressing   Problem: Pain Management: Goal: General experience of comfort will improve Outcome: Progressing   Problem: Safety: Goal: Ability to remain free from injury will improve Outcome: Progressing   Problem: Skin Integrity: Goal: Risk for impaired skin integrity will decrease Outcome: Progressing
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
## 2023-10-20 NOTE — Care Management Obs Status (Signed)
MEDICARE OBSERVATION STATUS NOTIFICATION   Patient Details  Name: William Webb MRN: [id number] Date of Birth: 13-Mar-1951   Medicare Observation Status Notification Given:  Yes    Lawerance Sabal, RN 10/20/2023, 9:39 AM
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
## 2023-10-20 NOTE — Procedures (Signed)
I have reviewed the HD regimen and made appropriate changes.  Vinson Moselle MD  [facility] 10/20/2023, 10:13 PM

## 2023-10-20 NOTE — Progress Notes (Signed)
Orthopedic Tech Progress Note Patient Details:  Shamier Dombeck [facility] [birth year] [id number]  Ortho Devices Type of Ortho Device: Prafo boot/shoe Ortho Device/Splint Location: rle Ortho Device/Splint Interventions: Ordered, Application, Adjustment   Post Interventions Patient Tolerated: Well Instructions Provided: Care of device, Adjustment of device  Trinna Post 10/20/2023, 8:22 PM
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
## 2023-10-20 NOTE — Assessment & Plan Note (Addendum)
No O2 requirement No definite PNA on CXR today Seems to be causing GI symptoms (N/V) as well as URI symptoms. Supportive care 3 day h/o symptoms; however, paxlovid not recommended for GFR < 30

## 2023-10-20 NOTE — Assessment & Plan Note (Signed)
Unclear how much of his SIRS is due to COVID-19 and how much due to diabetic foot infection.

## 2023-10-21 ENCOUNTER — Encounter (HOSPITAL_COMMUNITY): Payer: Medicare Other

## 2023-10-21 DIAGNOSIS — L089 Local infection of the skin and subcutaneous tissue, unspecified: Secondary | ICD-10-CM | POA: Diagnosis not present

## 2023-10-21 DIAGNOSIS — E11628 Type 2 diabetes mellitus with other skin complications: Secondary | ICD-10-CM | POA: Diagnosis not present

## 2023-10-21 LAB — COMPREHENSIVE METABOLIC PANEL
ALT: 33 U/L (ref 0–44)
AST: 44 U/L — ABNORMAL HIGH (ref 15–41)
Albumin: 2.7 g/dL — ABNORMAL LOW (ref 3.5–5.0)
Alkaline Phosphatase: 91 U/L (ref 38–126)
Anion gap: 14 (ref 5–15)
BUN: 28 mg/dL — ABNORMAL HIGH (ref 8–23)
CO2: 25 mmol/L (ref 22–32)
Calcium: 8 mg/dL — ABNORMAL LOW (ref 8.9–10.3)
Chloride: 97 mmol/L — ABNORMAL LOW (ref 98–111)
Creatinine, Ser: 4.91 mg/dL — ABNORMAL HIGH (ref 0.61–1.24)
GFR, Estimated: 12 mL/min — ABNORMAL LOW (ref >60)
Glucose, Bld: 87 mg/dL (ref 70–99)
Potassium: 4 mmol/L (ref 3.5–5.1)
Sodium: 136 mmol/L (ref 135–145)
Total Bilirubin: 0.6 mg/dL (ref <1.2)
Total Protein: 6.6 g/dL (ref 6.5–8.1)

## 2023-10-21 LAB — CBC WITH DIFFERENTIAL/PLATELET
Abs Immature Granulocytes: 0.01 10*3/uL (ref 0.00–0.07)
Basophils Absolute: 0 10*3/uL (ref 0.0–0.1)
Basophils Relative: 1 %
Eosinophils Absolute: 0.1 10*3/uL (ref 0.0–0.5)
Eosinophils Relative: 2 %
HCT: 32.9 % — ABNORMAL LOW (ref 39.0–52.0)
Hemoglobin: 9.8 g/dL — ABNORMAL LOW (ref 13.0–17.0)
Immature Granulocytes: 0 %
Lymphocytes Relative: 19 %
Lymphs Abs: 0.5 10*3/uL — ABNORMAL LOW (ref 0.7–4.0)
MCH: 28.2 pg (ref 26.0–34.0)
MCHC: 29.8 g/dL — ABNORMAL LOW (ref 30.0–36.0)
MCV: 94.8 fL (ref 80.0–100.0)
Monocytes Absolute: 0.4 10*3/uL (ref 0.1–1.0)
Monocytes Relative: 16 %
Neutro Abs: 1.5 10*3/uL — ABNORMAL LOW (ref 1.7–7.7)
Neutrophils Relative %: 62 %
Platelets: 55 10*3/uL — ABNORMAL LOW (ref 150–400)
RBC: 3.47 MIL/uL — ABNORMAL LOW (ref 4.22–5.81)
RDW: 15.9 % — ABNORMAL HIGH (ref 11.5–15.5)
WBC: 2.4 10*3/uL — ABNORMAL LOW (ref 4.0–10.5)
nRBC: 0 % (ref 0.0–0.2)

## 2023-10-21 LAB — HEPATITIS B SURFACE ANTIGEN: Hepatitis B Surface Ag: NONREACTIVE

## 2023-10-21 LAB — GLUCOSE, CAPILLARY
Glucose-Capillary: 80 mg/dL (ref 70–99)
Glucose-Capillary: 83 mg/dL (ref 70–99)
Glucose-Capillary: 150 mg/dL — ABNORMAL HIGH (ref 70–99)
Glucose-Capillary: 84 mg/dL (ref 70–99)

## 2023-10-21 LAB — TECHNOLOGIST SMEAR REVIEW: Plt Morphology: NORMAL

## 2023-10-21 LAB — C-REACTIVE PROTEIN: CRP: 4.8 mg/dL — ABNORMAL HIGH (ref <1.0)

## 2023-10-21 MED ORDER — LOPERAMIDE HCL 2 MG PO CAPS
4.0000 mg | ORAL_CAPSULE | Freq: Three times a day (TID) | ORAL | Status: DC
  Administered 2023-10-21 – 2023-10-23 (×4): 4 mg via ORAL
  Filled 2023-10-21 (×4): qty 2

## 2023-10-21 MED ORDER — CARVEDILOL 12.5 MG PO TABS
12.5000 mg | ORAL_TABLET | Freq: Two times a day (BID) | ORAL | Status: DC
  Administered 2023-10-21 – 2023-10-23 (×4): 12.5 mg via ORAL
  Filled 2023-10-21 (×4): qty 1

## 2023-10-21 MED ORDER — PHENOL 1.4 % MT LIQD
1.0000 | OROMUCOSAL | Status: DC | PRN
  Filled 2023-10-21: qty 177

## 2023-10-21 MED ORDER — RENA-VITE PO TABS
1.0000 | ORAL_TABLET | Freq: Every day | ORAL | Status: DC
  Administered 2023-10-21 – 2023-10-22 (×2): 1 via ORAL
  Filled 2023-10-21 (×2): qty 1

## 2023-10-21 MED ORDER — ENSURE ENLIVE PO LIQD
237.0000 mL | Freq: Two times a day (BID) | ORAL | Status: DC
Start: 2023-10-21 — End: 2023-10-22
  Administered 2023-10-21: 237 mL via ORAL

## 2023-10-21 NOTE — Progress Notes (Signed)
HOSPITALIST                ROUNDING                 NOTE William Webb [facility] KKX:[id number]  DOB: 1951/03/12  DOA: 10/19/2023  PCP: Irven Coe, MD  10/21/2023,9:39 AM   LOS: 0 days      Code Status: Full code From: Home  current Dispo: Unclear     72 year old black male Previous left lower extremity amputation 05/2023 Chronic gangrene/ulcer R lower extremitySeenDr. Vu of [facility]  12/12 wound looks stable.today  previous visit vascular surgery Dr. Lenell Antu- 09/10/2023  ESRD MWF PAF on Eliquis HTN HLD CAD Pulmonary nodules BPH Situational depression  Came to the emergency room not feeling well cough congestion nausea vomiting diarrhea-also foot was oozing more than prior and had been dry  previously-he tells me that he was told that Dr. Juanetta Gosling office to do the daily dressings and he elected do not follow-up with them or do the procedure   Sodium 136 potassium 4.5 BUN 37/5.3 albumin 3.1 Lactic acid 2.0 cycling to 2.2 Procalcitonin 1.55 WBC 3.0 hemoglobin 11.7 platelets 68 INR 1.3  CXR stable cardiopulmonary disease without evidence of active process DG foot no acute bony abnormality no evidence of osteo  COVID was positive-CRP was elevated at 5.3, sed rate was 11, Procalcitonin was 1.55 12/22 Dr. Lajoyce Corners consulted  Plan   sepsis 2/2 COVID more so than right lower extremity Hold all antibiotics monitor fever trend and if no recurrence then outpatient surgical candidate as per Dr. 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 phosphorus Continue Renvela 1.6 3 times daily   HTN  at home is on Coreg and this has been resumed 25 twice daily   paroxysmal A-fib on Eliquis CHADVASC >4 Cardioverted in 2020 Continue Eliquis 5 twice daily, Coreg as above  CAD with prior PCI and NSTEMI 09/2018 but unsuccessful Can continue Imdur 60 daily-Coreg  BPH Continue Proscar 5 daily, Flomax 0.4 at bedtime May benefit from simplification of regimen as this can cause hypotension additively  Pulmonary nodules Outpatient follow-up  Situational depression-resolved   DVT prophylaxis: Eliquis  Status is: Observation The patient will require care spanning > 2 midnights and should be moved to inpatient because:   Improving some await clinical course and resolution and if stable can likely discharge home in a.m. as may not require surgery    Subjective: Awake coherent no distress No chest pain no fever although tells me "I had 10 bouts of diarrhea yesterday" He is not having any bleeding has not had diarrhea today    Objective + exam Vitals:   10/20/23 2147 10/21/23 0035 10/21/23 0633 10/21/23 0818  BP: 104/85 (!) 147/77 132/81 (!) 140/81  Pulse: 81 73 74 70  Resp: 17 17 18    Temp: 100 F (37.8 C) 98.7 F (37.1 C) 99.6  F (37.6 C) 98.2 F (36.8 C)  TempSrc: Oral Oral  Oral  SpO2: 99% 97% 95% 95%  Weight:      Height:       Filed Weights   10/20/23 1311 10/20/23 1545 10/20/23 2015  Weight: 85.3 kg 85.3 kg 81.6 kg    Examination:  Awake coherent about her breakfast No chest pain No cough cold fever Abdomen soft no rebound Wounds not examined today Sinus rhythm on monitors   CBC    Component Value Date/Time   WBC 2.4 (L) 10/21/2023 0744   RBC 3.47 (L)  10/21/2023 0744   HGB 9.8 (L) 10/21/2023 0744   HGB 9.7 (L) 01/06/2020 1430   HCT 32.9 (L) 10/21/2023 0744   PLT 55 (L) 10/21/2023 0744   PLT 152 01/06/2020 1430   MCV 94.8 10/21/2023 0744   MCH 28.2 10/21/2023 0744   MCHC 29.8 (L) 10/21/2023 0744   RDW 15.9 (H) 10/21/2023 0744   LYMPHSABS 0.5 (L) 10/21/2023 0744   MONOABS 0.4 10/21/2023 0744   EOSABS 0.1 10/21/2023 0744   BASOSABS 0.0 10/21/2023 0744      Latest Ref Rng & Units 10/21/2023    7:44 AM 10/20/2023    1:48 AM 06/11/2023    2:29 AM  CMP  Glucose 70 - 99 mg/dL 87  161  096   BUN 8 - 23 mg/dL 28  37  33   Creatinine 0.61 - 1.24 mg/dL 0.45  4.09  8.11   Sodium 135 - 145 mmol/L 136  136  131   Potassium 3.5 - 5.1 mmol/L 4.0  4.5  3.3   Chloride 98 - 111 mmol/L 97  96  93   CO2 22 - 32 mmol/L 25  26  24    Calcium 8.9 - 10.3 mg/dL 8.0  8.4  7.9   Total Protein 6.5 - 8.1 g/dL 6.6  7.4  6.7   Total Bilirubin <1.2 mg/dL 0.6  0.8  0.5   Alkaline Phos 38 - 126 U/L 91  120  67   AST 15 - 41 U/L 44  50  18   ALT 0 - 44 U/L 33  23  7     Scheduled Meds:  apixaban  5 mg Oral BID   aspirin EC  81 mg Oral Daily   atorvastatin  80 mg Oral Daily   carvedilol  25 mg Oral BID   Chlorhexidine Gluconate Cloth  6 each Topical Q0600   finasteride  5 mg Oral Daily   gabapentin  600 mg Oral BID   insulin aspart  0-9 Units Subcutaneous TID WC   isosorbide mononitrate  60 mg Oral Daily   pantoprazole  40 mg Oral Daily   sevelamer carbonate  1,600 mg Oral TID WC   tamsulosin  0.4 mg Oral QHS   Continuous Infusions:    Time  26  Rhetta Mura, MD  [facility]

## 2023-10-21 NOTE — Progress Notes (Signed)
PHARMACY - PHYSICIAN COMMUNICATION CRITICAL VALUE ALERT - BLOOD CULTURE IDENTIFICATION (BCID)  [name] is an 72 y.o. male who presented to [facility] on 10/19/2023 with a chief complaint of N/D and malaise.  Assessment:  Pt started on IV ABX for diabetic wound infection of heel, now growing GPR in 1 of 4 blood cx bottles; likely a contaminant.  Name of physician (or Provider) Contacted: CHall DO  Current antibiotics: vancomycin, ceftriaxone, metronidazole  Changes to prescribed antibiotics recommended:  No changes for now, narrow as appropriate.  Vernard Gambles, PharmD, BCPS  10/21/2023  3:35 AM

## 2023-10-21 NOTE — Progress Notes (Addendum)
Mobility Specialist Progress Note:   10/21/23 1538  Mobility  Activity Dangled on edge of bed  Level of Assistance Standby assist, set-up cues, supervision of patient - no hands on  Activity Response Tolerated fair  Mobility Referral Yes  Mobility visit 1 Mobility  Mobility Specialist Start Time (ACUTE ONLY) 1535  Mobility Specialist Stop Time (ACUTE ONLY) 1538  Mobility Specialist Time Calculation (min) (ACUTE ONLY) 3 min   Pt received in bed, able to dangle EOB. Deferred ambulation/ transfers d/t severe dizziness and stool incontinence, BP WFL. Assisted RN w peri care linen change. Left with all needs met, call bell in reach.   Feliciana Rossetti Mobility Specialist Please contact via Special educational needs teacher or  Rehab office at [phone]

## 2023-10-21 NOTE — Progress Notes (Signed)
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 daily. 01/10/23  Yes Patwardhan, Anabel Bene, MD  aspirin EC 81 MG tablet Take 81 mg by mouth daily. Swallow whole.   Yes [provider]  atorvastatin (LIPITOR) 80 MG tablet Take 80 mg by mouth daily.   Yes [provider]  carvedilol (COREG) 25 MG tablet Take 1 tablet (25 mg total) by mouth 2 (two) times daily. 06/11/23  Yes Zigmund Daniel., MD  ethyl chloride spray Apply 1 Application topically 3 (three) times a week. At dialysis 04/25/23  Yes  [provider]  finasteride (PROSCAR) 5 MG tablet Take 5 mg  by mouth daily. 11/08/22  Yes [provider]  gabapentin (NEURONTIN) 600 MG tablet Take 0.5 tablets (300 mg total) by mouth 2 (two) times daily. Patient taking differently: Take 600 mg by mouth 2 (two) times daily. 08/02/20  Yes Calvert Cantor, MD  isosorbide mononitrate (IMDUR) 60 MG 24 hr tablet Take 1 tablet (60 mg total) by mouth daily. 08/03/20  Yes Calvert Cantor, MD  linaclotide (LINZESS) 145 MCG CAPS capsule Take 145 mcg by mouth as needed (constipation).   Yes [provider]  loratadine (CLARITIN) 10 MG tablet Take 10 mg by mouth daily as needed for allergies.   Yes [provider]  omeprazole (PRILOSEC) 20 MG capsule Take 20 mg by mouth daily.   Yes [provider]  polyethylene glycol powder (GLYCOLAX/MIRALAX) 17 GM/SCOOP powder Take 17 g by mouth in the morning, at noon, and at bedtime. One scoop in beverage of your choice with each meal. You may increase if you continue to be constipated and decrease if your stool becomes too loose. Patient taking differently: Take 17 g by mouth daily as needed for moderate constipation. 02/27/23  Yes Fondaw, Wylder S, PA  sevelamer carbonate (RENVELA) 800 MG tablet Take 2 tablets (1,600 mg total) by mouth 3 (three) times daily with meals. 06/11/23  Yes Zigmund Daniel., MD  tamsulosin (FLOMAX) 0.4 MG CAPS capsule Take 0.4 mg by mouth at bedtime. 11/16/19  Yes [provider]  lidocaine-prilocaine (EMLA) cream Apply 1 application  topically as needed (prior to port being accessed). 07/05/21   [provider]    Discontinued Meds:   Medications Discontinued During This Encounter  Medication Reason   vancomycin (VANCOCIN) IVPB 1000 mg/200 mL premix Dose change   heparin injection 5,000 Units    mirtazapine (REMERON SOL-TAB) 15 MG disintegrating tablet Discontinued by provider   cefTRIAXone (ROCEPHIN) 2 g in sodium chloride 0.9 % 100  mL IVPB    metroNIDAZOLE (FLAGYL) tablet 500 mg    vancomycin variable dose per unstable renal function (pharmacist dosing)     Social History:  reports that he quit smoking about 40 years ago. His smoking use included cigarettes. He started smoking about 60 years ago. He has a 6.6 pack-year smoking history. He has never used smokeless tobacco. He reports that he does not currently use alcohol. He reports that he does not currently use drugs.  Family History:   Family History  Problem Relation Age of Onset   Hypertension Mother    Diabetes Mother    Hyperlipidemia Mother    Hypertension Father    Hypertension Sister    Cancer Sister    Colon cancer Brother    Esophageal cancer Neg Hx    Stomach cancer Neg Hx    Rectal cancer Neg Hx     Blood pressure (!) 140/81, pulse 70, temperature 98.2 F (36.8 C), temperature source Oral, resp. rate 18, height 6\' 2"  (1.88 m), weight 81.6 kg, SpO2 95%. Physical Exam: GEN: NAD, A&Ox3, NCAT HEENT: No conjunctival pallor, EOMI NECK: Supple, no thyromegaly LUNGS: CTA B/L no rales CV: RRR, No M/R/G ABD: SNDNT +BS  EXT: Left BKA, right foot lesion  ACCESS: Left upper arm brachiobasilic transposed fistula positive thrill, bandages removed and hemostasis was already obtained.  Not hyper pulsatile     Alantis Bethune, Len Blalock, MD 10/21/2023, 8:55 AM

## 2023-10-21 NOTE — Progress Notes (Signed)
Pt receives out-pt HD at [facility] [place] on MWF. Will assist as needed.   Olivia Canter Renal Navigator [phone]
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
## 2023-10-22 ENCOUNTER — Inpatient Hospital Stay (HOSPITAL_COMMUNITY): Payer: Medicare Other

## 2023-10-22 DIAGNOSIS — E440 Moderate protein-calorie malnutrition: Secondary | ICD-10-CM | POA: Insufficient documentation

## 2023-10-22 DIAGNOSIS — L97411 Non-pressure chronic ulcer of right heel and midfoot limited to breakdown of skin: Secondary | ICD-10-CM | POA: Diagnosis present

## 2023-10-22 DIAGNOSIS — Z89512 Acquired absence of left leg below knee: Secondary | ICD-10-CM | POA: Diagnosis not present

## 2023-10-22 DIAGNOSIS — L97419 Non-pressure chronic ulcer of right heel and midfoot with unspecified severity: Secondary | ICD-10-CM | POA: Diagnosis present

## 2023-10-22 DIAGNOSIS — I132 Hypertensive heart and chronic kidney disease with heart failure and with stage 5 chronic kidney disease, or end stage renal disease: Secondary | ICD-10-CM | POA: Diagnosis present

## 2023-10-22 DIAGNOSIS — D631 Anemia in chronic kidney disease: Secondary | ICD-10-CM | POA: Diagnosis present

## 2023-10-22 DIAGNOSIS — D696 Thrombocytopenia, unspecified: Secondary | ICD-10-CM | POA: Diagnosis present

## 2023-10-22 DIAGNOSIS — E1142 Type 2 diabetes mellitus with diabetic polyneuropathy: Secondary | ICD-10-CM | POA: Diagnosis present

## 2023-10-22 DIAGNOSIS — U071 COVID-19: Secondary | ICD-10-CM | POA: Diagnosis present

## 2023-10-22 DIAGNOSIS — E11628 Type 2 diabetes mellitus with other skin complications: Secondary | ICD-10-CM | POA: Diagnosis present

## 2023-10-22 DIAGNOSIS — I96 Gangrene, not elsewhere classified: Secondary | ICD-10-CM | POA: Diagnosis present

## 2023-10-22 DIAGNOSIS — A419 Sepsis, unspecified organism: Secondary | ICD-10-CM | POA: Diagnosis not present

## 2023-10-22 DIAGNOSIS — R918 Other nonspecific abnormal finding of lung field: Secondary | ICD-10-CM | POA: Diagnosis not present

## 2023-10-22 DIAGNOSIS — E1122 Type 2 diabetes mellitus with diabetic chronic kidney disease: Secondary | ICD-10-CM | POA: Diagnosis present

## 2023-10-22 DIAGNOSIS — E113592 Type 2 diabetes mellitus with proliferative diabetic retinopathy without macular edema, left eye: Secondary | ICD-10-CM | POA: Diagnosis present

## 2023-10-22 DIAGNOSIS — L089 Local infection of the skin and subcutaneous tissue, unspecified: Secondary | ICD-10-CM | POA: Diagnosis not present

## 2023-10-22 DIAGNOSIS — H35033 Hypertensive retinopathy, bilateral: Secondary | ICD-10-CM | POA: Diagnosis present

## 2023-10-22 DIAGNOSIS — D72819 Decreased white blood cell count, unspecified: Secondary | ICD-10-CM | POA: Diagnosis present

## 2023-10-22 DIAGNOSIS — I4819 Other persistent atrial fibrillation: Secondary | ICD-10-CM | POA: Diagnosis present

## 2023-10-22 DIAGNOSIS — A4189 Other specified sepsis: Secondary | ICD-10-CM | POA: Diagnosis present

## 2023-10-22 DIAGNOSIS — N186 End stage renal disease: Secondary | ICD-10-CM | POA: Diagnosis present

## 2023-10-22 DIAGNOSIS — I517 Cardiomegaly: Secondary | ICD-10-CM | POA: Diagnosis not present

## 2023-10-22 DIAGNOSIS — Z681 Body mass index (BMI) 19 or less, adult: Secondary | ICD-10-CM | POA: Diagnosis not present

## 2023-10-22 DIAGNOSIS — N250 Renal osteodystrophy: Secondary | ICD-10-CM | POA: Diagnosis not present

## 2023-10-22 DIAGNOSIS — G9341 Metabolic encephalopathy: Secondary | ICD-10-CM | POA: Diagnosis not present

## 2023-10-22 DIAGNOSIS — E11649 Type 2 diabetes mellitus with hypoglycemia without coma: Secondary | ICD-10-CM | POA: Diagnosis not present

## 2023-10-22 DIAGNOSIS — E11621 Type 2 diabetes mellitus with foot ulcer: Secondary | ICD-10-CM | POA: Diagnosis present

## 2023-10-22 DIAGNOSIS — R112 Nausea with vomiting, unspecified: Secondary | ICD-10-CM | POA: Diagnosis not present

## 2023-10-22 DIAGNOSIS — J90 Pleural effusion, not elsewhere classified: Secondary | ICD-10-CM | POA: Diagnosis not present

## 2023-10-22 DIAGNOSIS — R6510 Systemic inflammatory response syndrome (SIRS) of non-infectious origin without acute organ dysfunction: Secondary | ICD-10-CM | POA: Diagnosis not present

## 2023-10-22 DIAGNOSIS — Z992 Dependence on renal dialysis: Secondary | ICD-10-CM | POA: Diagnosis not present

## 2023-10-22 DIAGNOSIS — E113291 Type 2 diabetes mellitus with mild nonproliferative diabetic retinopathy without macular edema, right eye: Secondary | ICD-10-CM | POA: Diagnosis present

## 2023-10-22 DIAGNOSIS — E1152 Type 2 diabetes mellitus with diabetic peripheral angiopathy with gangrene: Secondary | ICD-10-CM | POA: Diagnosis present

## 2023-10-22 DIAGNOSIS — I120 Hypertensive chronic kidney disease with stage 5 chronic kidney disease or end stage renal disease: Secondary | ICD-10-CM | POA: Diagnosis not present

## 2023-10-22 LAB — CBC WITH DIFFERENTIAL/PLATELET
Abs Immature Granulocytes: 0.01 10*3/uL (ref 0.00–0.07)
Abs Immature Granulocytes: 0.02 10*3/uL (ref 0.00–0.07)
Basophils Absolute: 0 10*3/uL (ref 0.0–0.1)
Basophils Absolute: 0 10*3/uL (ref 0.0–0.1)
Basophils Relative: 0 %
Basophils Relative: 1 %
Eosinophils Absolute: 0.1 10*3/uL (ref 0.0–0.5)
Eosinophils Absolute: 0 10*3/uL (ref 0.0–0.5)
Eosinophils Relative: 2 %
Eosinophils Relative: 1 %
HCT: 33.3 % — ABNORMAL LOW (ref 39.0–52.0)
HCT: 38.4 % — ABNORMAL LOW (ref 39.0–52.0)
Hemoglobin: 10 g/dL — ABNORMAL LOW (ref 13.0–17.0)
Hemoglobin: 11.6 g/dL — ABNORMAL LOW (ref 13.0–17.0)
Immature Granulocytes: 0 %
Immature Granulocytes: 1 %
Lymphocytes Relative: 26 %
Lymphocytes Relative: 17 %
Lymphs Abs: 0.6 10*3/uL — ABNORMAL LOW (ref 0.7–4.0)
Lymphs Abs: 0.4 10*3/uL — ABNORMAL LOW (ref 0.7–4.0)
MCH: 28.7 pg (ref 26.0–34.0)
MCH: 28.9 pg (ref 26.0–34.0)
MCHC: 30 g/dL (ref 30.0–36.0)
MCHC: 30.2 g/dL (ref 30.0–36.0)
MCV: 95.4 fL (ref 80.0–100.0)
MCV: 95.5 fL (ref 80.0–100.0)
Monocytes Absolute: 0.3 10*3/uL (ref 0.1–1.0)
Monocytes Absolute: 0.2 10*3/uL (ref 0.1–1.0)
Monocytes Relative: 12 %
Monocytes Relative: 8 %
Neutro Abs: 1.4 10*3/uL — ABNORMAL LOW (ref 1.7–7.7)
Neutro Abs: 1.7 10*3/uL (ref 1.7–7.7)
Neutrophils Relative %: 60 %
Neutrophils Relative %: 72 %
Platelets: 62 10*3/uL — ABNORMAL LOW (ref 150–400)
Platelets: 67 10*3/uL — ABNORMAL LOW (ref 150–400)
RBC: 3.49 MIL/uL — ABNORMAL LOW (ref 4.22–5.81)
RBC: 4.02 MIL/uL — ABNORMAL LOW (ref 4.22–5.81)
RDW: 15.6 % — ABNORMAL HIGH (ref 11.5–15.5)
RDW: 15.7 % — ABNORMAL HIGH (ref 11.5–15.5)
WBC: 2.4 10*3/uL — ABNORMAL LOW (ref 4.0–10.5)
WBC: 2.4 10*3/uL — ABNORMAL LOW (ref 4.0–10.5)
nRBC: 0 % (ref 0.0–0.2)
nRBC: 0 % (ref 0.0–0.2)

## 2023-10-22 LAB — RENAL FUNCTION PANEL
Albumin: 2.7 g/dL — ABNORMAL LOW (ref 3.5–5.0)
Anion gap: 10 (ref 5–15)
BUN: 35 mg/dL — ABNORMAL HIGH (ref 8–23)
CO2: 24 mmol/L (ref 22–32)
Calcium: 7.8 mg/dL — ABNORMAL LOW (ref 8.9–10.3)
Chloride: 102 mmol/L (ref 98–111)
Creatinine, Ser: 6.21 mg/dL — ABNORMAL HIGH (ref 0.61–1.24)
GFR, Estimated: 9 mL/min — ABNORMAL LOW (ref >60)
Glucose, Bld: 114 mg/dL — ABNORMAL HIGH (ref 70–99)
Phosphorus: 4.2 mg/dL (ref 2.5–4.6)
Potassium: 4.1 mmol/L (ref 3.5–5.1)
Sodium: 136 mmol/L (ref 135–145)

## 2023-10-22 LAB — GLUCOSE, CAPILLARY
Glucose-Capillary: 333 mg/dL — ABNORMAL HIGH (ref 70–99)
Glucose-Capillary: 80 mg/dL (ref 70–99)
Glucose-Capillary: 36 mg/dL — CL (ref 70–99)
Glucose-Capillary: 140 mg/dL — ABNORMAL HIGH (ref 70–99)
Glucose-Capillary: 151 mg/dL — ABNORMAL HIGH (ref 70–99)
Glucose-Capillary: 162 mg/dL — ABNORMAL HIGH (ref 70–99)
Glucose-Capillary: 115 mg/dL — ABNORMAL HIGH (ref 70–99)

## 2023-10-22 LAB — HEPATITIS B SURFACE ANTIBODY, QUANTITATIVE: Hep B S AB Quant (Post): 2141 m[IU]/mL

## 2023-10-22 MED ORDER — LIDOCAINE HCL (PF) 1 % IJ SOLN: 5.0000 mL | INTRAMUSCULAR | Status: DC | PRN

## 2023-10-22 MED ORDER — NEPRO/CARBSTEADY PO LIQD: 237.0000 mL | ORAL | Status: DC | PRN

## 2023-10-22 MED ORDER — GLUCAGON HCL RDNA (DIAGNOSTIC) 1 MG IJ SOLR
INTRAMUSCULAR | Status: AC
  Filled 2023-10-22: qty 1

## 2023-10-22 MED ORDER — HEPARIN SODIUM (PORCINE) 1000 UNIT/ML DIALYSIS
1000.0000 [IU] | INTRAMUSCULAR | Status: DC | PRN
Start: 2023-10-22 — End: 2023-10-23

## 2023-10-22 MED ORDER — DEXTROSE 50 % IV SOLN: 12.5000 g | INTRAVENOUS | Status: AC

## 2023-10-22 MED ORDER — ALTEPLASE 2 MG IJ SOLR: 2.0000 mg | Freq: Once | INTRAMUSCULAR | Status: DC | PRN

## 2023-10-22 MED ORDER — ANTICOAGULANT SODIUM CITRATE 4% (200MG/5ML) IV SOLN: 5.0000 mL | Status: DC | PRN

## 2023-10-22 MED ORDER — LIDOCAINE-PRILOCAINE 2.5-2.5 % EX CREA: 1.0000 | TOPICAL_CREAM | CUTANEOUS | Status: DC | PRN

## 2023-10-22 MED ORDER — DEXTROSE 50 % IV SOLN
INTRAVENOUS | Status: AC
  Administered 2023-10-22: 50 mL via INTRAVENOUS
  Filled 2023-10-22: qty 50

## 2023-10-22 MED ORDER — PENTAFLUOROPROP-TETRAFLUOROETH EX AERO
1.0000 | INHALATION_SPRAY | CUTANEOUS | Status: DC | PRN
Start: 2023-10-22 — End: 2023-10-23
  Filled 2023-10-22: qty 116

## 2023-10-22 NOTE — Significant Event (Signed)
Rapid Response Event Note   Reason for Call :  "Rapid response to 2W18 " page  Initial Focused Assessment:  Patient responding to voice only initially though rapidly improving after amp D50. Lungs diminished. Heart tones normal. Skin cool to touch, sheets/gown show evidence pt previously diaphoretic.   162/72 (98) HR 56 RR 17 Temp 93.4 rectal CBG 36  Interventions/Plan of Care:  D50 x1, repeat CBG 151, 140 MD to bedside Dry gown/sheets, warm blankets/room temp increased: Repeat temp 93.0 rectal Transfer PCU  Event Summary:  MD Notified: Landis Gandy MD Call Time: 1610 Arrival Time: 9604 End Time: 5409  Truddie Crumble, RN

## 2023-10-22 NOTE — Progress Notes (Signed)
   10/22/23 1705  Spiritual Encounters  Type of Visit Attempt (pt unavailable)  Care provided to: Pt not available  Conversation partners present during encounter Nurse;Physician  Reason for visit Code  OnCall Visit Yes   Responded to rapid response - later cancelled as cleared
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
## 2023-10-22 NOTE — Progress Notes (Signed)
RN went in for keep on checking on patient at 1627 pm. NT was at bed side taking, vitals and CBG. RN noticed that  patient got sweat and unresponsive.  Called the code. NT told that CBG came 56mg /dl. Made the provider aware by floor RN.   Patient became responsive after D5 . CBG came 151mg /dl.  He was still hypothermic. Covered with blankets and applied heat pack.  RN at bed side.  Patient was provided with 04 oz of orange juice  at 1700pm. And fed 50% of dinner by RN.  Awaiting to be transferred to 5W.  Will continue to monitor

## 2023-10-22 NOTE — Progress Notes (Signed)
Patient seen and re-examined   Tells me he did not really eat lunch nursing confirms Blood sugar improved Hypothermic persistently low in the 93 range Differential diagnosis could be sepsis I looked at his right foot and the heel looks very well scabbed over without any purulence or fluctuance to my exam He has no wheeze or rales or rhonchi His mentation is intact  We will however transfer him to progressive unit for closer monitoring I will do a circumscribed workup inclusive of CBC chest x-ray and blood cultures and they should be followed by night coverage and he will need to be watched closely   Pleas Koch, MD [facility] 5:37 PM

## 2023-10-22 NOTE — Progress Notes (Signed)
HOSPITALIST                ROUNDING                 NOTE [facility] ZOX:[id number]  DOB: [birth year]  DOA: 10/19/2023  PCP: Irven Coe, MD  10/22/2023,8:35 AM   LOS: 0 days      Code Status: Full code From: Home  current Dispo: Unclear     72 year old black male Previous left lower extremity amputation 05/2023 Chronic gangrene/ulcer R lower extremitySeenDr. Vu of [facility]  12/12 wound looks stable.today  previous visit vascular surgery Dr. Lenell Antu- 09/10/2023  ESRD MWF PAF on Eliquis HTN HLD CAD Pulmonary nodules BPH Situational depression  Came to the emergency room not feeling well cough congestion nausea vomiting diarrhea-also foot was oozing more than prior and had been dry  previously-he tells me that he was told that Dr. Juanetta Gosling office to do the daily dressings and he elected do not follow-up with them or do the procedure   Sodium 136 potassium 4.5 BUN 37/5.3 albumin 3.1 Lactic acid 2.0 cycling to 2.2 Procalcitonin 1.55 WBC 3.0 hemoglobin 11.7 platelets 68 INR 1.3  CXR stable cardiopulmonary disease without evidence of active process DG foot no acute bony abnormality no evidence of osteo  COVID was positive-CRP was elevated at 5.3, sed rate was 11, Procalcitonin was 1.55 12/22 Dr. Lajoyce Corners consulted  Plan  Sepsis 2/2 COVID-stable right lower extremity wound-for endovascular repair 10/31/2023 Antibiotics discontinued 12/22-wound stable per Dr. Lajoyce Corners for outpatient follow-up   Because of debility as below would keep overnight and monitor  Diarrhea, weakness and difficulty ambulating Initially all thought to be secondary to the COVID with up to 10 episodes of diarrhea on 12/22 and about 6 yesterday Given 1 dose of Imodium Has not taken MiraLAX Feel it is imperative to rule out other pathogens with GI pathogen panel C. difficile He is not stable for discharge-- if these labs are negative we would start scheduled Imodium  Right lower extremity chronic venous ulcer + PAD  with R TP peroneal revascularization 02/2022 and amputation  Left BKA 05/2023-for endovascular repair as above-continue gabapentin 600 twice daily  Thrombocytopenia Probably from viral illness COVID versus other See above discussion  ESRD HD [facility] MWF metabolic bone disease anemia renal disease  defer dialysis to them-phosphorus is okay-continue Renvela 1.6 3 times daily  Paroxysmal A-fib on Eliquis CHA2DS2-VASc: 7 4-Eliquis 5 5 twice daily CAD with prior PCI, NSTEMI 09/2018-unsuccessful revascularization Continue Imdur 60 daily HTN-continue Coreg 25 twice daily  BPH Continue Proscar 5, Flomax 0.4  Pulmonary nodules-outpatient follow-up  DVT prophylaxis: Eliquis  Status is: Observation The patient will require care spanning > 2 midnights and should be moved to inpatient because:   Still has diarrhea-not stable and still quite weak    Subjective:  6 bouts of diarrhea yesterday feels quite weak with moving-as has left BKA difficult to mobilize will need to be more independent at home     Objective + exam Vitals:   10/21/23 1618 10/21/23 2034 10/22/23 0017 10/22/23 0406  BP: 125/60 133/73 129/70 135/73  Pulse: 62 65 63 66  Resp: 18     Temp: 99.4 F (37.4 C) 98.8 F (37.1 C) 98.8 F (37.1 C) 99.3 F (37.4 C)  TempSrc:  Oral Oral Oral  SpO2: 96% 99% 99% 98%  Weight:      Height:       Filed Weights   10/20/23 1311 10/20/23 1545 10/20/23 2015  Weight: 85.3 kg 85.3 kg 81.6 kg    Examination:  Awake coherent about her breakfast Chest is clear no wheeze S1-S2 no murmur ROM intact Power 5/5    CBC    Component Value Date/Time   WBC 2.4 (L) 10/22/2023 0549   RBC 3.49 (L) 10/22/2023 0549   HGB 10.0 (L) 10/22/2023 0549   HGB 9.7 (L) 01/06/2020 1430   HCT 33.3 (L) 10/22/2023 0549   PLT 62 (L) 10/22/2023 0549   PLT 152 01/06/2020 1430   MCV 95.4 10/22/2023 0549   MCH 28.7 10/22/2023 0549   MCHC 30.0 10/22/2023 0549   RDW 15.6 (H)  10/22/2023 0549   LYMPHSABS 0.6 (L) 10/22/2023 0549   MONOABS 0.3 10/22/2023 0549   EOSABS 0.1 10/22/2023 0549   BASOSABS 0.0 10/22/2023 0549      Latest Ref Rng & Units 10/22/2023    5:49 AM 10/21/2023    7:44 AM 10/20/2023    1:48 AM  CMP  Glucose 70 - 99 mg/dL 324  87  401   BUN 8 - 23 mg/dL 35  28  37   Creatinine 0.61 - 1.24 mg/dL 0.27  2.53  6.64   Sodium 135 - 145 mmol/L 136  136  136   Potassium 3.5 - 5.1 mmol/L 4.1  4.0  4.5   Chloride 98 - 111 mmol/L 102  97  96   CO2 22 - 32 mmol/L 24  25  26    Calcium 8.9 - 10.3 mg/dL 7.8  8.0  8.4   Total Protein 6.5 - 8.1 g/dL  6.6  7.4   Total Bilirubin <1.2 mg/dL  0.6  0.8   Alkaline Phos 38 - 126 U/L  91  120   AST 15 - 41 U/L  44  50   ALT 0 - 44 U/L  33  23     Scheduled Meds:  apixaban  5 mg Oral BID   aspirin EC  81 mg Oral Daily   atorvastatin  80 mg Oral Daily   carvedilol  12.5 mg Oral BID   Chlorhexidine Gluconate Cloth  6 each Topical Q0600   gabapentin  600 mg Oral BID   insulin aspart  0-9 Units Subcutaneous TID WC   loperamide  4 mg Oral Q8H   multivitamin  1 tablet Oral QHS   pantoprazole  40 mg Oral Daily   sevelamer carbonate  1,600 mg Oral TID WC   tamsulosin  0.4 mg Oral QHS   Continuous Infusions:    Time  26  Rhetta Mura, MD  [facility]

## 2023-10-22 NOTE — Progress Notes (Signed)
Became unresponsive RN texted--at bedside seen examined CBG 56.  Given amp d50  some confusion but rapidly clearing CN intact Screening neuro exam overall re-assuring--no focal deficit--  P Hypoglycemia--rpt cbg q 30 x 2 Watch mentaiton--if worse would Ct head.  D/w nursing  Pleas Koch, MD [facility] 4:49 PM

## 2023-10-23 DIAGNOSIS — E11628 Type 2 diabetes mellitus with other skin complications: Secondary | ICD-10-CM | POA: Diagnosis not present

## 2023-10-23 DIAGNOSIS — L089 Local infection of the skin and subcutaneous tissue, unspecified: Secondary | ICD-10-CM | POA: Diagnosis not present

## 2023-10-23 LAB — CBC WITH DIFFERENTIAL/PLATELET
Abs Immature Granulocytes: 0.01 10*3/uL (ref 0.00–0.07)
Basophils Absolute: 0 10*3/uL (ref 0.0–0.1)
Basophils Relative: 0 %
Eosinophils Absolute: 0 10*3/uL (ref 0.0–0.5)
Eosinophils Relative: 2 %
HCT: 36 % — ABNORMAL LOW (ref 39.0–52.0)
Hemoglobin: 11.3 g/dL — ABNORMAL LOW (ref 13.0–17.0)
Immature Granulocytes: 0 %
Lymphocytes Relative: 24 %
Lymphs Abs: 0.6 10*3/uL — ABNORMAL LOW (ref 0.7–4.0)
MCH: 28.8 pg (ref 26.0–34.0)
MCHC: 31.4 g/dL (ref 30.0–36.0)
MCV: 91.8 fL (ref 80.0–100.0)
Monocytes Absolute: 0.2 10*3/uL (ref 0.1–1.0)
Monocytes Relative: 10 %
Neutro Abs: 1.6 10*3/uL — ABNORMAL LOW (ref 1.7–7.7)
Neutrophils Relative %: 64 %
Platelets: 68 10*3/uL — ABNORMAL LOW (ref 150–400)
RBC: 3.92 MIL/uL — ABNORMAL LOW (ref 4.22–5.81)
RDW: 15.5 % (ref 11.5–15.5)
WBC: 2.5 10*3/uL — ABNORMAL LOW (ref 4.0–10.5)
nRBC: 0 % (ref 0.0–0.2)

## 2023-10-23 LAB — COMPREHENSIVE METABOLIC PANEL
ALT: 20 U/L (ref 0–44)
AST: 22 U/L (ref 15–41)
Albumin: 2.9 g/dL — ABNORMAL LOW (ref 3.5–5.0)
Alkaline Phosphatase: 84 U/L (ref 38–126)
Anion gap: 13 (ref 5–15)
BUN: 21 mg/dL (ref 8–23)
CO2: 26 mmol/L (ref 22–32)
Calcium: 8.1 mg/dL — ABNORMAL LOW (ref 8.9–10.3)
Chloride: 96 mmol/L — ABNORMAL LOW (ref 98–111)
Creatinine, Ser: 4.63 mg/dL — ABNORMAL HIGH (ref 0.61–1.24)
GFR, Estimated: 13 mL/min — ABNORMAL LOW (ref >60)
Glucose, Bld: 159 mg/dL — ABNORMAL HIGH (ref 70–99)
Potassium: 3.6 mmol/L (ref 3.5–5.1)
Sodium: 135 mmol/L (ref 135–145)
Total Bilirubin: 0.7 mg/dL (ref <1.2)
Total Protein: 7 g/dL (ref 6.5–8.1)

## 2023-10-23 LAB — CORTISOL: Cortisol, Plasma: 7.1 ug/dL

## 2023-10-23 LAB — TSH: TSH: 1.415 u[IU]/mL (ref 0.350–4.500)

## 2023-10-23 LAB — GLUCOSE, CAPILLARY: Glucose-Capillary: 115 mg/dL — ABNORMAL HIGH (ref 70–99)

## 2023-10-23 LAB — CULTURE, BLOOD (ROUTINE X 2)
Culture  Setup Time: NO GROWTH
Special Requests: ADEQUATE

## 2023-10-23 MED ORDER — INSULIN ASPART 100 UNIT/ML IJ SOLN: 0.0000 [IU] | Freq: Three times a day (TID) | INTRAMUSCULAR | Status: DC

## 2023-10-23 MED ORDER — LOPERAMIDE HCL 2 MG PO CAPS: 2.0000 mg | ORAL_CAPSULE | Freq: Four times a day (QID) | ORAL | 0 refills | Status: DC | PRN

## 2023-10-23 NOTE — Progress Notes (Signed)
Received patient at bedside in room [phone].  Alert and oriented.  Informed consent signed and in chart.   TX duration: 3:00  Patient tolerated well.  Alert, without acute distress.  Hand-off given to patient's nurse.   Access used: RUE AVF Access issues: None  Total UF removed: 2000 mL Medication(s) given: None Post HD VS: please see data insert    10/23/23 0330  Vitals  Temp 97.9 F (36.6 C)  Temp Source Axillary  BP (!) 158/69  MAP (mmHg) 96  BP Location Right Arm  BP Method Automatic  Patient Position (if appropriate) Lying  Pulse Rate 74  Pulse Rate Source Monitor  ECG Heart Rate 73  Resp 20  Oxygen Therapy  SpO2 97 %  O2 Device Room Air  Patient Activity (if Appropriate) In bed  Pulse Oximetry Type Continuous  Post Treatment  Dialyzer Clearance Lightly streaked  Hemodialysis Intake (mL) 0 mL  Liters Processed 72  Fluid Removed (mL) 2000 mL  Tolerated HD Treatment Yes  Post-Hemodialysis Comments Treatment completed and blood returned without issue.  AVG/AVF Arterial Site Held (minutes) 10 minutes (Gauze applied and secured with paper tape; hemostasis achieved.)  AVG/AVF Venous Site Held (minutes) 10 minutes (Gauze applied and secured with paper tape; hemostasis achieved.)  Note  Patient Observations Patient alert, no acute distress noted; patient condition stable upon this treatment discharge.  Fistula / Graft Left Upper arm  No placement date or time found.   Placed prior to admission: Yes  Orientation: Left  Access Location: Upper arm  Site Condition No complications  Fistula / Graft Assessment Present;Thrill;Bruit  Status Flushed;Patent;Deaccessed  Drainage Description None      [facility]
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
## 2023-10-23 NOTE — Discharge Summary (Signed)
William Webb [facility] ZOX:[id number] DOB: Dec 28, 1950 DOA: 10/19/2023  PCP: Irven Coe, MD  Admit date: 10/19/2023  Discharge date: 10/23/2023  Admitted From: Home   Disposition:  Home   Recommendations for Outpatient Follow-up:   Follow up with PCP in 1-2 weeks  PCP Please obtain BMP/CBC, 2 view CXR in 1week,  (see Discharge instructions)   PCP Please follow up on the following pending results: Needs outpatient follow-up with Dr. Lajoyce Corners orthopedics, Dr. Lenell Antu vascular surgery and pulmonary for pulmonary nodule monitoring.  Kindly arrange.   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 Renold Don of [facility]  12/12 wound looks stable.today  previous visit vascular surgery Dr. Lenell Antu- 09/10/2023  ESRD MWF PAF on Eliquis HTN HLD CAD Pulmonary nodules BPH Situational depression   Came to the emergency room not feeling well cough congestion nausea vomiting diarrhea-also foot was oozing more than prior and had been dry  previously-he tells me that he was told that Dr. Verita Lamb office to do the daily dressings and he elected do not follow-up with them or do the procedure.  The ER workup consistent with COVID-positive infection and he was admitted to the hospital.  Dr. Lajoyce Corners orthopedics was also consulted.                                                                 Hospital Course    Sepsis  2/2 COVID-stable right lower extremity wound-for endovascular repair 10/31/2023 Antibiotics discontinued 12/22-wound stable per Dr. Lajoyce Corners for outpatient follow-up    Diarrhea, weakness and difficulty ambulating COVID-19 infection, ruled out C. difficile, diarrhea much improved with Imodium, continue supportive care, symptom-free and feeling a whole lot better eager to go home will be discharged on as needed Imodium with outpatient PCP follow-up.   Right lower extremity chronic venous ulcer + PAD with R TP peroneal revascularization 02/2022 and amputation, continue wound care at home as before and follow-up with vascular surgery and orthopedics in the outpatient setting postdischarge. 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 Paulene Floor, stable for discharge-phosphorus is okay-continue Renvela 1.6 3 times daily   Paroxysmal A-fib on Eliquis CHA2DS2-VASc: 7 4-Eliquis 5 5 twice daily CAD with prior  PCI, NSTEMI 09/2018-unsuccessful revascularization Continue Imdur 60 daily HTN-continue Coreg 25 twice daily   BPH Continue Proscar 5, Flomax 0.4   Pulmonary nodules-outpatient follow-up with pulmonary to be arranged by PCP.  Hypoglycemia evening of 10/22/2023 metabolic encephalopathy.  Due to poor oral intake resolved with supportive care.  Awake alert oriented x 3, symptom-free tolerating oral diet, diarrhea much improved eager to go home.  No headache, no focal deficits.  He says he is back to his baseline.   Discharge diagnosis     Principal Problem:   Diabetic foot infection (HCC) Active Problems:   COVID-19 virus infection   ESRD (end stage renal disease) (HCC)   SIRS (systemic inflammatory response syndrome) (HCC)   Essential hypertension   Insulin dependent type 2 diabetes mellitus  (HCC)   Non-pressure chronic ulcer of right heel and midfoot limited to breakdown of skin (HCC)   Malnutrition of moderate degree    Discharge instructions    Discharge Instructions     Diet - low sodium heart healthy   Complete by: As directed    Discharge instructions   Complete by: As directed    Follow with Primary MD Irven Coe, MD in 7 days   Get CBC, CMP, TSH, A1c, 2 view Chest X ray -  checked next visit with your primary MD   Activity: As tolerated with Full fall precautions use walker/cane & assistance as needed  Disposition Home     Diet: Renal, 1.5 L fluid restriction per day  Special Instructions: If you have smoked or chewed Tobacco  in the last 2 yrs please stop smoking, stop any regular Alcohol  and or any Recreational drug use.  On your next visit with your primary care physician please Get Medicines reviewed and adjusted.  Please request your Prim.MD to go over all Hospital Tests and Procedure/Radiological results at the follow up, please get all Hospital records sent to your Prim MD by signing hospital release before you go home.  If you experience worsening of your admission symptoms, develop shortness of breath, life threatening emergency, suicidal or homicidal thoughts you must seek medical attention immediately by calling 911 or calling your MD immediately  if symptoms less severe.  You Must read complete instructions/literature along with all the possible adverse reactions/side effects for all the Medicines you take and that have been prescribed to you. Take any new Medicines after you have completely understood and accpet all the possible adverse reactions/side effects.   Do not drive when taking Pain medications.  Do not take more than prescribed Pain, Sleep and Anxiety Medications   Discharge wound care:   Complete by: As directed    Continue L.BKA site care as instructed by your orthopedic surgeon, R.heel care as before.   Increase activity slowly    Complete by: As directed    MyChart COVID-19 home monitoring program   Complete by: Oct 23, 2023    Is the patient willing to use the MyChart Mobile App for home monitoring?: Yes   Temperature monitoring   Complete by: Oct 23, 2023    After how many days would you like to receive a notification of this patient's flowsheet entries?: 1       Discharge Medications   Allergies as of 10/23/2023   No Known Allergies      Medication List     TAKE these medications    acetaminophen 325 MG tablet Commonly known as: TYLENOL Take 1-2 tablets (325-650 mg total) by mouth every  6 (six) hours as needed for mild pain (pain score 1-3 or temp > 100.5).   apixaban 5 MG Tabs tablet Commonly known as: ELIQUIS Take 1 tablet (5 mg total) by mouth 2 (two) times daily.   aspirin EC 81 MG tablet Take 81 mg by mouth daily. Swallow whole.   atorvastatin 80 MG tablet Commonly known as: LIPITOR Take 80 mg by mouth daily.   carvedilol 25 MG tablet Commonly known as: COREG Take 1 tablet (25 mg total) by mouth 2 (two) times daily.   ethyl chloride spray Apply 1 Application topically 3 (three) times a week. At dialysis   finasteride 5 MG tablet Commonly known as: PROSCAR Take 5 mg by mouth daily.   gabapentin 600 MG tablet Commonly known as: NEURONTIN Take 0.5 tablets (300 mg total) by mouth 2 (two) times daily. What changed: how much to take   isosorbide mononitrate 60 MG 24 hr tablet Commonly known as: IMDUR Take 1 tablet (60 mg total) by mouth daily.   lidocaine-prilocaine cream Commonly known as: EMLA Apply 1 application  topically as needed (prior to port being accessed).   Linzess 145 MCG Caps capsule Generic drug: linaclotide Take 145 mcg by mouth as needed (constipation).   loperamide 2 MG capsule Commonly known as: IMODIUM Take 1 capsule (2 mg total) by mouth every 6 (six) hours as needed for diarrhea or loose stools.   loratadine 10 MG tablet Commonly known as:  CLARITIN Take 10 mg by mouth daily as needed for allergies.   omeprazole 20 MG capsule Commonly known as: PRILOSEC Take 20 mg by mouth daily.   polyethylene glycol powder 17 GM/SCOOP powder Commonly known as: GLYCOLAX/MIRALAX Take 17 g by mouth in the morning, at noon, and at bedtime. One scoop in beverage of your choice with each meal. You may increase if you continue to be constipated and decrease if your stool becomes too loose. What changed:  when to take this reasons to take this additional instructions   sevelamer carbonate 800 MG tablet Commonly known as: RENVELA Take 2 tablets (1,600 mg total) by mouth 3 (three) times daily with meals.   tamsulosin 0.4 MG Caps capsule Commonly known as: FLOMAX Take 0.4 mg by mouth at bedtime.               Discharge Care Instructions  (From admission, onward)           Start     Ordered   10/23/23 0000  Discharge wound care:       Comments: Continue L.BKA site care as instructed by your orthopedic surgeon, R.heel care as before.   10/23/23 4782             Follow-up Information     [name], MD Follow up in 2 week(s).   Specialty: Orthopedic Surgery Contact information: [address] [place] [address] [phone]         Irven Coe, MD. Schedule an appointment as soon as possible for a visit in 1 week(s).   Specialty: Family Medicine Contact information: [address] [place] [address] [phone]                 Major procedures and Radiology Reports - PLEASE review detailed and final reports thoroughly  -      DG CHEST PORT 1 VIEW Result Date: 10/22/2023 CLINICAL DATA:  Sepsis EXAM: PORTABLE CHEST 1 VIEW COMPARISON:  10/19/2023 FINDINGS: Shallow inspiration. Cardiac enlargement. Mild perihilar  infiltration may represent early edema or pneumonia. Small left pleural effusion. No pneumothorax. Calcification of the aorta. Radiopaque foreign body projecting over  the mediastinum may be postoperative. No change since previous study. Degenerative changes in the spine and shoulders. Old left rib fractures. IMPRESSION: Cardiac enlargement with mild perihilar infiltration, possibly edema or pneumonia. Small left pleural effusion. Electronically Signed   By: Burman Nieves M.D.   On: 10/22/2023 18:38   MR FOOT RIGHT WO CONTRAST Result Date: 10/20/2023 CLINICAL DATA:  Open healed wound.  Diabetic. EXAM: MRI OF THE RIGHT hindfoot/ankle WITHOUT CONTRAST TECHNIQUE: Multiplanar, multisequence MR imaging of the right foot was performed. No intravenous contrast was administered. COMPARISON:  Radiographs, same date. FINDINGS: Ill-defined heel wound noted along the plantar and posterior aspect of the calcaneus. There is some slight T1 and T2 signal abnormality in the subcortical bone of the calcaneus suspicious for early osteomyelitis. Diffuse cellulitis and myofasciitis without discrete drainable soft tissue abscess pyomyositis. The tibiotalar and subtalar joints are maintained. No findings for septic arthritis. The visualized midfoot joint spaces are maintained. The major tendons and ligaments appear intact. IMPRESSION: 1. Ill-defined heel wound along the plantar and posterior aspect of the calcaneus. 2. Slight T1 and T2 signal abnormality in the subcortical bone of the calcaneus suspicious for early osteomyelitis. 3. Diffuse cellulitis and myofasciitis without discrete drainable soft tissue abscess or pyomyositis. Electronically Signed   By: Rudie Meyer M.D.   On: 10/20/2023 10:06   DG Foot Complete Right Result Date: 10/20/2023 CLINICAL DATA:  Infection EXAM: RIGHT FOOT COMPLETE - 3+ VIEW COMPARISON:  05/24/2022 FINDINGS: Prior 2nd toe amputation. No acute bony abnormality. Specifically, no fracture, subluxation, or dislocation. No bone destruction to suggest osteomyelitis. Joint spaces maintained. Diffuse vascular calcifications present. IMPRESSION: No acute bony  abnormality.  No evidence of osteomyelitis. Electronically Signed   By: Charlett Nose M.D.   On: 10/20/2023 02:20   DG Chest Port 1 View Result Date: 10/19/2023 CLINICAL DATA:  Questionable sepsis. EXAM: PORTABLE CHEST 1 VIEW COMPARISON:  May 25, 2023 FINDINGS: The cardiac silhouette is mildly enlarged and unchanged in size. Mild to moderate severity calcification of the aortic arch is seen. Low lung volumes are noted without evidence of acute infiltrate, pleural effusion or pneumothorax. Radiopaque shrapnel fragments are seen within the soft tissues of the posterior chest wall. Multilevel degenerative changes are seen throughout the thoracic spine. IMPRESSION: Stable cardiomegaly without evidence of acute or active cardiopulmonary disease. Electronically Signed   By: Aram Candela M.D.   On: 10/19/2023 23:59    Micro Results    Recent Results (from the past 240 hours)  Blood Culture (routine x 2)     Status: Abnormal   Collection Time: 10/19/23 11:29 PM   Specimen: BLOOD  Result Value Ref Range Status   Specimen Description BLOOD RIGHT ANTECUBITAL  Final   Special Requests   Final    BOTTLES DRAWN AEROBIC AND ANAEROBIC Blood Culture adequate volume   Culture  Setup Time   Final    GRAM POSITIVE RODS ANAEROBIC BOTTLE ONLY CRITICAL RESULT CALLED TO, READ BACK BY AND VERIFIED WITH: V [email or web address],[id number]  10/21/23 MK    Culture (A)  Final    CORYNEBACTERIUM STRIATUM Standardized susceptibility testing for this organism is not available. Performed at [facility], [address], [place], [place] [address]    Report Status 10/23/2023 FINAL  Final  Blood Culture (routine x 2)     Status: None (Preliminary result)   Collection Time:  10/19/23 11:53 PM   Specimen: BLOOD RIGHT HAND  Result Value Ref Range Status   Specimen Description BLOOD RIGHT HAND  Final   Special Requests   Final    BOTTLES DRAWN AEROBIC AND ANAEROBIC Blood Culture adequate volume   Culture   Final    NO GROWTH  3 DAYS Performed at [facility], [address] [place], [place] [address]    Report Status PENDING  Incomplete  Resp panel by RT-PCR (RSV, Flu A&B, Covid) Anterior Nasal Swab     Status: Abnormal   Collection Time: 10/20/23  1:29 AM   Specimen: Anterior Nasal Swab  Result Value Ref Range Status   SARS Coronavirus 2 by RT PCR POSITIVE (A) NEGATIVE Final   Influenza A by PCR NEGATIVE NEGATIVE Final   Influenza B by PCR NEGATIVE NEGATIVE Final    Comment: (NOTE) The Xpert Xpress SARS-CoV-2/FLU/RSV plus assay is intended as an aid in the diagnosis of influenza from Nasopharyngeal swab specimens and should not be used as a sole basis for treatment. Nasal washings and aspirates are unacceptable for Xpert Xpress SARS-CoV-2/FLU/RSV testing.  Fact Sheet for Patients: BloggerCourse.com  Fact Sheet for Healthcare Providers: SeriousBroker.it  This test is not yet approved or cleared by the [place] FDA and has been authorized for detection and/or diagnosis of SARS-CoV-2 by FDA under an Emergency Use Authorization (EUA). This EUA will remain in effect (meaning this test can be used) for the duration of the COVID-19 declaration under Section 564(b)(1) of the Act, 21 U.S.C. section 360bbb-3(b)(1), unless the authorization is terminated or revoked.     Resp Syncytial Virus by PCR NEGATIVE NEGATIVE Final    Comment: (NOTE) Fact Sheet for Patients: BloggerCourse.com  Fact Sheet for Healthcare Providers: SeriousBroker.it  This test is not yet approved or cleared by the [place] FDA and has been authorized for detection and/or diagnosis of SARS-CoV-2 by FDA under an Emergency Use Authorization (EUA). This EUA will remain in effect (meaning this test can be used) for the duration of the COVID-19 declaration under Section 564(b)(1) of the Act, 21 U.S.C. section 360bbb-3(b)(1),  unless the authorization is terminated or revoked.  Performed at [facility], [address], [place], [place] [address]   Culture, blood (Routine X 2) w Reflex to ID Panel     Status: None (Preliminary result)   Collection Time: 10/22/23  6:06 PM   Specimen: BLOOD  Result Value Ref Range Status   Specimen Description BLOOD SITE NOT SPECIFIED  Final   Special Requests   Final    BOTTLES DRAWN AEROBIC ONLY Blood Culture results may not be optimal due to an inadequate volume of blood received in culture bottles   Culture   Final    NO GROWTH < 12 HOURS Performed at [facility], [address], [place], [place] [address]    Report Status PENDING  Incomplete  Culture, blood (Routine X 2) w Reflex to ID Panel     Status: None (Preliminary result)   Collection Time: 10/22/23  6:09 PM   Specimen: BLOOD  Result Value Ref Range Status   Specimen Description BLOOD SITE NOT SPECIFIED  Final   Special Requests   Final    BOTTLES DRAWN AEROBIC AND ANAEROBIC Blood Culture results may not be optimal due to an inadequate volume of blood received in culture bottles   Culture   Final    NO GROWTH < 12 HOURS Performed at [facility]  Lab, [address], [place], [place] [address]    Report Status PENDING  Incomplete    Today   Subjective    Eilam Spiegel today has no headache,no chest abdominal pain,no new weakness tingling or numbness, feels much better wants to go home today.    Objective   Blood pressure (!) 158/69, pulse 74, temperature 97.9 F (36.6 C), temperature source Axillary, resp. rate 20, height 6\' 2"  (1.88 m), weight 81.6 kg, SpO2 97%.   Intake/Output Summary (Last 24 hours) at 10/23/2023 0944 Last data filed at 10/23/2023 0330 Gross per 24 hour  Intake --  Output 2000 ml  Net -2000 ml    Exam  Awake Alert, No new F.N deficits,    [place].AT,PERRAL Supple Neck,   Symmetrical Chest wall movement, Good air movement bilaterally, CTAB RRR,No Gallops,    +ve B.Sounds, Abd Soft, Non tender,  L>BKA, R heel chronic ulcer appears stable and dry, no cellulitis or discharge,   Data Review   Recent Labs  Lab 10/19/23 2324 10/21/23 0744 10/22/23 0549 10/22/23 1809 10/23/23 0504  WBC 3.0* 2.4* 2.4* 2.4* 2.5*  HGB 11.7* 9.8* 10.0* 11.6* 11.3*  HCT 38.7* 32.9* 33.3* 38.4* 36.0*  PLT 68* 55* 62* 67* 68*  MCV 95.1 94.8 95.4 95.5 91.8  MCH 28.7 28.2 28.7 28.9 28.8  MCHC 30.2 29.8* 30.0 30.2 31.4  RDW 16.4* 15.9* 15.6* 15.7* 15.5  LYMPHSABS 0.4* 0.5* 0.6* 0.4* 0.6*  MONOABS 0.4 0.4 0.3 0.2 0.2  EOSABS 0.0 0.1 0.1 0.0 0.0  BASOSABS 0.0 0.0 0.0 0.0 0.0    Recent Labs  Lab 10/19/23 2324 10/19/23 2344 10/20/23 0148 10/20/23 0156 10/20/23 0411 10/21/23 0744 10/22/23 0549 10/23/23 0504  NA  --   --  136  --   --  136 136 135  K  --   --  4.5  --   --  4.0 4.1 3.6  CL  --   --  96*  --   --  97* 102 96*  CO2  --   --  26  --   --  25 24 26   ANIONGAP  --   --  14  --   --  14 10 13   GLUCOSE  --   --  125*  --   --  87 114* 159*  BUN  --   --  37*  --   --  28* 35* 21  CREATININE  --   --  5.37*  --   --  4.91* 6.21* 4.63*  AST  --   --  50*  --   --  44*  --  22  ALT  --   --  23  --   --  33  --  20  ALKPHOS  --   --  120  --   --  91  --  84  BILITOT  --   --  0.8  --   --  0.6  --  0.7  ALBUMIN  --   --  3.1*  --   --  2.7* 2.7* 2.9*  CRP  --   --   --   --  5.3* 4.8*  --   --   PROCALCITON  --   --   --   --  1.55  --   --   --   LATICACIDVEN  --  2.0*  --  2.2*  --   --   --   --  INR 1.3*  --   --   --   --   --   --   --   TSH  --   --   --   --   --   --   --  1.415  CALCIUM  --   --  8.4*  --   --  8.0* 7.8* 8.1*    Total Time in preparing paper work, data evaluation and todays exam - 35 minutes  Signature  -    Susa Raring M.D on 10/23/2023 at 9:44 AM   -  To page go to [email or web address]

## 2023-10-23 NOTE — TOC Transition Note (Signed)
Transition of Care [facility]) - Discharge Note   Patient Details  Name: Alijah Glisan MRN: [id number] Date of Birth: 1951-05-09  Transition of Care [facility]) CM/SW Contact:  Leone Haven, RN Phone Number: 10/23/2023, 9:55 AM   Clinical Narrative:    For dc today, NCM asked MD for orders for [facility], and CSW so patient can resume HH services.           Patient Goals and CMS Choice            Discharge Placement                       Discharge Plan and Services Additional resources added to the After Visit Summary for                                       Social Drivers of Health (SDOH) Interventions SDOH Screenings   Food Insecurity: No Food Insecurity (10/20/2023)  Housing: Low Risk  (10/20/2023)  Transportation Needs: No Transportation Needs (10/20/2023)  Utilities: Not At Risk (10/20/2023)  Depression (PHQ2-9): Low Risk  (10/07/2020)  Tobacco Use: Medium Risk (10/19/2023)     Readmission Risk Interventions    05/22/2023   11:05 AM 03/22/2023    4:41 PM 03/19/2022    3:43 PM  Readmission Risk Prevention Plan  Transportation Screening Complete Complete Complete  Medication Review (RN Care Manager) Referral to Pharmacy Complete Complete  PCP or Specialist appointment within 3-5 days of discharge Complete Complete Complete  HRI or Home Care Consult Complete Complete Complete  SW Recovery Care/Counseling Consult Complete Complete Complete  Palliative Care Screening Not Applicable Not Applicable Not Applicable  Skilled Nursing Facility Not Applicable Not Applicable Not Applicable

## 2023-10-23 NOTE — Discharge Planning (Addendum)
 [facility] Patient Discharge Orders- [facility] CLINIC: [facility]  Patient's name: William Webb  Admit/DC Dates: 10/19/2023 - 10/23/2023  Discharge Diagnoses: COVID 19  Pulmonary nodules needs follow up as OP with pulmonary medicine  Diarrhea, weakness and difficulty ambulating  Right lower extremity chronic venous ulcer + PAD   Aranesp : Given: No   Date and amount of last dose: NA  Last Hgb: 11.3 PRBC's Given: No Date/# of units: Na ESA dose for discharge: mircera 0 mcg IV q 2 weeks Dose per protocol IV Iron dose at discharge: Per protocol  Heparin  change: No  EDW Change: Yes New EDW: 81 kg Please assess. He has not been getting to EDW  [place] Change: No  Access intervention/Change: No Details:  Hectorol /Calcitriol  change: No  Discharge Labs: Calcium  8.1 Phosphorus 4.2 Albumin  2.9 K+ 3.6  IV Antibiotics: No Details:  On Coumadin?: No Last INR: Next INR: Managed By:   OTHER/APPTS/LAB ORDERS:    D/C Meds to be reconciled by nurse after every discharge.  Completed By: Ricka Daring [facility] [phone]    Reviewed by: MD:______ RN_______

## 2023-10-23 NOTE — Plan of Care (Signed)
  Problem: Education: Goal: Knowledge of risk factors and measures for prevention of condition will improve Outcome: Completed/Met   Problem: Coping: Goal: Psychosocial and spiritual needs will be supported Outcome: Completed/Met   Problem: Respiratory: Goal: Will maintain a patent airway Outcome: Completed/Met Goal: Complications related to the disease process, condition or treatment will be avoided or minimized Outcome: Completed/Met   Problem: Education: Goal: Ability to describe self-care measures that may prevent or decrease complications (Diabetes Survival Skills Education) will improve Outcome: Completed/Met Goal: Individualized Educational Video(s) Outcome: Completed/Met   Problem: Coping: Goal: Ability to adjust to condition or change in health will improve Outcome: Completed/Met   Problem: Fluid Volume: Goal: Ability to maintain a balanced intake and output will improve Outcome: Completed/Met   Problem: Health Behavior/Discharge Planning: Goal: Ability to identify and utilize available resources and services will improve Outcome: Completed/Met Goal: Ability to manage health-related needs will improve Outcome: Completed/Met   Problem: Metabolic: Goal: Ability to maintain appropriate glucose levels will improve Outcome: Completed/Met   Problem: Nutritional: Goal: Maintenance of adequate nutrition will improve Outcome: Completed/Met Goal: Progress toward achieving an optimal weight will improve Outcome: Completed/Met   Problem: Skin Integrity: Goal: Risk for impaired skin integrity will decrease Outcome: Completed/Met   Problem: Tissue Perfusion: Goal: Adequacy of tissue perfusion will improve Outcome: Completed/Met   Problem: Education: Goal: Knowledge of General Education information will improve Description: Including pain rating scale, medication(s)/side effects and non-pharmacologic comfort measures Outcome: Completed/Met   Problem: Health  Behavior/Discharge Planning: Goal: Ability to manage health-related needs will improve Outcome: Completed/Met   Problem: Clinical Measurements: Goal: Ability to maintain clinical measurements within normal limits will improve Outcome: Completed/Met Goal: Will remain free from infection Outcome: Completed/Met Goal: Diagnostic test results will improve Outcome: Completed/Met Goal: Respiratory complications will improve Outcome: Completed/Met Goal: Cardiovascular complication will be avoided Outcome: Completed/Met   Problem: Activity: Goal: Risk for activity intolerance will decrease Outcome: Completed/Met   Problem: Nutrition: Goal: Adequate nutrition will be maintained Outcome: Completed/Met   Problem: Coping: Goal: Level of anxiety will decrease Outcome: Completed/Met   Problem: Elimination: Goal: Will not experience complications related to bowel motility Outcome: Completed/Met Goal: Will not experience complications related to urinary retention Outcome: Completed/Met   Problem: Pain Management: Goal: General experience of comfort will improve Outcome: Completed/Met   Problem: Safety: Goal: Ability to remain free from injury will improve Outcome: Completed/Met   Problem: Skin Integrity: Goal: Risk for impaired skin integrity will decrease Outcome: Completed/Met

## 2023-10-23 NOTE — Discharge Instructions (Addendum)
Follow with Primary MD Irven Coe, MD in 7 days   Get CBC, CMP, TSH, A1c, 2 view Chest X ray -  checked next visit with your primary MD   Activity: As tolerated with Full fall precautions use walker/cane & assistance as needed  Disposition Home     Diet: Renal, 1.5 L fluid restriction per day  Special Instructions: If you have smoked or chewed Tobacco  in the last 2 yrs please stop smoking, stop any regular Alcohol  and or any Recreational drug use.  On your next visit with your primary care physician please Get Medicines reviewed and adjusted.  Please request your Prim.MD to go over all Hospital Tests and Procedure/Radiological results at the follow up, please get all Hospital records sent to your Prim MD by signing hospital release before you go home.  If you experience worsening of your admission symptoms, develop shortness of breath, life threatening emergency, suicidal or homicidal thoughts you must seek medical attention immediately by calling 911 or calling your MD immediately  if symptoms less severe.  You Must read complete instructions/literature along with all the possible adverse reactions/side effects for all the Medicines you take and that have been prescribed to you. Take any new Medicines after you have completely understood and accpet all the possible adverse reactions/side effects.   Do not drive when taking Pain medications.  Do not take more than prescribed Pain, Sleep and Anxiety Medications

## 2023-10-24 ENCOUNTER — Telehealth: Payer: Self-pay | Admitting: Nurse Practitioner

## 2023-10-24 NOTE — Telephone Encounter (Signed)
Transition of Care - Initial Contact from Inpatient Facility  Date of discharge: 10/22/2023 Date of contact: 10/24/2023 Method: Phone Spoke to: Patient  Patient contacted to discuss transition of care from recent inpatient hospitalization. Patient was admitted to [facility] from 10/19/2023 - 10/23/2023  with discharge diagnosis of Sepsis 2/2 COVID   The discharge medication list was reviewed. Patient understands the changes and has no concerns.   Patient will return to his/her outpatient HD unit on:   No other concerns at this time.

## 2023-10-24 NOTE — Progress Notes (Signed)
Late Note Entry- Oct 24, 2023  Pt was d/c yesterday. Contacted [facility] this morning to be advised of pt's d/c date and that pt should resume care tomorrow. Clinic also advised pt is covid positive.   Olivia Canter Renal Navigator [phone]

## 2023-10-25 DIAGNOSIS — R52 Pain, unspecified: Secondary | ICD-10-CM | POA: Diagnosis not present

## 2023-10-25 DIAGNOSIS — I151 Hypertension secondary to other renal disorders: Secondary | ICD-10-CM | POA: Diagnosis not present

## 2023-10-25 DIAGNOSIS — N186 End stage renal disease: Secondary | ICD-10-CM | POA: Diagnosis not present

## 2023-10-25 DIAGNOSIS — E1129 Type 2 diabetes mellitus with other diabetic kidney complication: Secondary | ICD-10-CM | POA: Diagnosis not present

## 2023-10-25 DIAGNOSIS — D631 Anemia in chronic kidney disease: Secondary | ICD-10-CM | POA: Diagnosis not present

## 2023-10-25 DIAGNOSIS — Z992 Dependence on renal dialysis: Secondary | ICD-10-CM | POA: Diagnosis not present

## 2023-10-25 DIAGNOSIS — D689 Coagulation defect, unspecified: Secondary | ICD-10-CM | POA: Diagnosis not present

## 2023-10-25 DIAGNOSIS — N2581 Secondary hyperparathyroidism of renal origin: Secondary | ICD-10-CM | POA: Diagnosis not present

## 2023-10-25 DIAGNOSIS — D509 Iron deficiency anemia, unspecified: Secondary | ICD-10-CM | POA: Diagnosis not present

## 2023-10-25 DIAGNOSIS — R197 Diarrhea, unspecified: Secondary | ICD-10-CM | POA: Diagnosis not present

## 2023-10-25 LAB — CULTURE, BLOOD (ROUTINE X 2)
Culture: NO GROWTH
Special Requests: ADEQUATE

## 2023-10-27 DIAGNOSIS — I151 Hypertension secondary to other renal disorders: Secondary | ICD-10-CM | POA: Diagnosis not present

## 2023-10-27 DIAGNOSIS — N186 End stage renal disease: Secondary | ICD-10-CM | POA: Diagnosis not present

## 2023-10-27 DIAGNOSIS — N2581 Secondary hyperparathyroidism of renal origin: Secondary | ICD-10-CM | POA: Diagnosis not present

## 2023-10-27 DIAGNOSIS — D631 Anemia in chronic kidney disease: Secondary | ICD-10-CM | POA: Diagnosis not present

## 2023-10-27 DIAGNOSIS — R197 Diarrhea, unspecified: Secondary | ICD-10-CM | POA: Diagnosis not present

## 2023-10-27 DIAGNOSIS — D509 Iron deficiency anemia, unspecified: Secondary | ICD-10-CM | POA: Diagnosis not present

## 2023-10-27 DIAGNOSIS — R52 Pain, unspecified: Secondary | ICD-10-CM | POA: Diagnosis not present

## 2023-10-27 DIAGNOSIS — Z992 Dependence on renal dialysis: Secondary | ICD-10-CM | POA: Diagnosis not present

## 2023-10-27 DIAGNOSIS — D689 Coagulation defect, unspecified: Secondary | ICD-10-CM | POA: Diagnosis not present

## 2023-10-27 DIAGNOSIS — E1129 Type 2 diabetes mellitus with other diabetic kidney complication: Secondary | ICD-10-CM | POA: Diagnosis not present

## 2023-10-27 LAB — CULTURE, BLOOD (ROUTINE X 2)
Culture: NO GROWTH
Culture: NO GROWTH

## 2023-10-29 DIAGNOSIS — I151 Hypertension secondary to other renal disorders: Secondary | ICD-10-CM | POA: Diagnosis not present

## 2023-10-29 DIAGNOSIS — R52 Pain, unspecified: Secondary | ICD-10-CM | POA: Diagnosis not present

## 2023-10-29 DIAGNOSIS — E1129 Type 2 diabetes mellitus with other diabetic kidney complication: Secondary | ICD-10-CM | POA: Diagnosis not present

## 2023-10-29 DIAGNOSIS — D509 Iron deficiency anemia, unspecified: Secondary | ICD-10-CM | POA: Diagnosis not present

## 2023-10-29 DIAGNOSIS — Z992 Dependence on renal dialysis: Secondary | ICD-10-CM | POA: Diagnosis not present

## 2023-10-29 DIAGNOSIS — N186 End stage renal disease: Secondary | ICD-10-CM | POA: Diagnosis not present

## 2023-10-29 DIAGNOSIS — D689 Coagulation defect, unspecified: Secondary | ICD-10-CM | POA: Diagnosis not present

## 2023-10-29 DIAGNOSIS — D631 Anemia in chronic kidney disease: Secondary | ICD-10-CM | POA: Diagnosis not present

## 2023-10-29 DIAGNOSIS — R197 Diarrhea, unspecified: Secondary | ICD-10-CM | POA: Diagnosis not present

## 2023-10-29 DIAGNOSIS — N2581 Secondary hyperparathyroidism of renal origin: Secondary | ICD-10-CM | POA: Diagnosis not present

## 2023-10-31 ENCOUNTER — Ambulatory Visit (HOSPITAL_COMMUNITY)
Admission: RE | Disposition: A | Discharge status: Home / Self Care | Payer: Self-pay | Source: Home / Self Care | Attending: Vascular Surgery

## 2023-10-31 ENCOUNTER — Observation Stay (HOSPITAL_COMMUNITY)
Admission: RE | Admit: 2023-10-31 | Discharge: 2023-11-01 | Disposition: A | Discharge status: Home / Self Care | Payer: Medicare Other | Attending: Vascular Surgery | Admitting: Vascular Surgery

## 2023-10-31 ENCOUNTER — Other Ambulatory Visit: Payer: Self-pay

## 2023-10-31 ENCOUNTER — Telehealth: Payer: Self-pay | Admitting: Cardiology

## 2023-10-31 ENCOUNTER — Encounter (HOSPITAL_COMMUNITY)
Admission: RE | Disposition: A | Discharge status: Home / Self Care | Payer: Self-pay | Source: Home / Self Care | Attending: Vascular Surgery

## 2023-10-31 DIAGNOSIS — Z96651 Presence of right artificial knee joint: Secondary | ICD-10-CM | POA: Insufficient documentation

## 2023-10-31 DIAGNOSIS — E1122 Type 2 diabetes mellitus with diabetic chronic kidney disease: Secondary | ICD-10-CM | POA: Insufficient documentation

## 2023-10-31 DIAGNOSIS — Z87891 Personal history of nicotine dependence: Secondary | ICD-10-CM | POA: Insufficient documentation

## 2023-10-31 DIAGNOSIS — I2510 Atherosclerotic heart disease of native coronary artery without angina pectoris: Secondary | ICD-10-CM | POA: Insufficient documentation

## 2023-10-31 DIAGNOSIS — Z992 Dependence on renal dialysis: Secondary | ICD-10-CM | POA: Insufficient documentation

## 2023-10-31 DIAGNOSIS — I509 Heart failure, unspecified: Secondary | ICD-10-CM | POA: Diagnosis not present

## 2023-10-31 DIAGNOSIS — I4819 Other persistent atrial fibrillation: Secondary | ICD-10-CM | POA: Diagnosis not present

## 2023-10-31 DIAGNOSIS — Z794 Long term (current) use of insulin: Secondary | ICD-10-CM | POA: Diagnosis not present

## 2023-10-31 DIAGNOSIS — N186 End stage renal disease: Secondary | ICD-10-CM | POA: Diagnosis not present

## 2023-10-31 DIAGNOSIS — Z79899 Other long term (current) drug therapy: Secondary | ICD-10-CM | POA: Diagnosis not present

## 2023-10-31 DIAGNOSIS — Z7901 Long term (current) use of anticoagulants: Secondary | ICD-10-CM | POA: Diagnosis not present

## 2023-10-31 DIAGNOSIS — I70234 Atherosclerosis of native arteries of right leg with ulceration of heel and midfoot: Secondary | ICD-10-CM

## 2023-10-31 DIAGNOSIS — Z7982 Long term (current) use of aspirin: Secondary | ICD-10-CM | POA: Diagnosis not present

## 2023-10-31 DIAGNOSIS — I70223 Atherosclerosis of native arteries of extremities with rest pain, bilateral legs: Principal | ICD-10-CM | POA: Diagnosis present

## 2023-10-31 HISTORY — PX: ABDOMINAL AORTOGRAM W/LOWER EXTREMITY: CATH118223

## 2023-10-31 HISTORY — PX: PERIPHERAL INTRAVASCULAR LITHOTRIPSY: CATH118324

## 2023-10-31 LAB — POCT I-STAT, CHEM 8
BUN: 34 mg/dL — ABNORMAL HIGH (ref 8–23)
Calcium, Ion: 1.03 mmol/L — ABNORMAL LOW (ref 1.15–1.40)
Chloride: 93 mmol/L — ABNORMAL LOW (ref 98–111)
Creatinine, Ser: 5.7 mg/dL — ABNORMAL HIGH (ref 0.61–1.24)
Glucose, Bld: 100 mg/dL — ABNORMAL HIGH (ref 70–99)
HCT: 34 % — ABNORMAL LOW (ref 39.0–52.0)
Hemoglobin: 11.6 g/dL — ABNORMAL LOW (ref 13.0–17.0)
Potassium: 4.7 mmol/L (ref 3.5–5.1)
Sodium: 137 mmol/L (ref 135–145)
TCO2: 34 mmol/L — ABNORMAL HIGH (ref 22–32)

## 2023-10-31 LAB — GLUCOSE, CAPILLARY
Glucose-Capillary: 79 mg/dL (ref 70–99)
Glucose-Capillary: 107 mg/dL — ABNORMAL HIGH (ref 70–99)
Glucose-Capillary: 149 mg/dL — ABNORMAL HIGH (ref 70–99)

## 2023-10-31 SURGERY — ABDOMINAL AORTOGRAM W/LOWER EXTREMITY
Anesthesia: LOCAL | Laterality: Right

## 2023-10-31 SURGERY — A/V FISTULAGRAM
Anesthesia: LOCAL

## 2023-10-31 MED ORDER — ISOSORBIDE MONONITRATE ER 60 MG PO TB24
60.0000 mg | ORAL_TABLET | Freq: Every day | ORAL | Status: DC
  Administered 2023-10-31: 60 mg via ORAL
  Filled 2023-10-31: qty 1

## 2023-10-31 MED ORDER — ATORVASTATIN CALCIUM 80 MG PO TABS
80.0000 mg | ORAL_TABLET | Freq: Every day | ORAL | Status: DC
  Administered 2023-10-31 – 2023-11-01 (×2): 80 mg via ORAL
  Filled 2023-10-31 (×2): qty 1

## 2023-10-31 MED ORDER — SODIUM CHLORIDE 0.9 % IV SOLN: 250.0000 mL | INTRAVENOUS | Status: DC | PRN

## 2023-10-31 MED ORDER — ASPIRIN 81 MG PO TBEC
81.0000 mg | DELAYED_RELEASE_TABLET | Freq: Every day | ORAL | Status: DC
  Administered 2023-11-01: 81 mg via ORAL
  Filled 2023-10-31: qty 1

## 2023-10-31 MED ORDER — FINASTERIDE 5 MG PO TABS
5.0000 mg | ORAL_TABLET | Freq: Every day | ORAL | Status: DC
  Administered 2023-10-31 – 2023-11-01 (×2): 5 mg via ORAL
  Filled 2023-10-31 (×2): qty 1

## 2023-10-31 MED ORDER — HYDRALAZINE HCL 20 MG/ML IJ SOLN
INTRAMUSCULAR | Status: DC | PRN
  Administered 2023-10-31 (×2): 10 mg via INTRAVENOUS

## 2023-10-31 MED ORDER — LABETALOL HCL 5 MG/ML IV SOLN: 10.0000 mg | INTRAVENOUS | Status: DC | PRN

## 2023-10-31 MED ORDER — HEPARIN (PORCINE) IN NACL 1000-0.9 UT/500ML-% IV SOLN
INTRAVENOUS | Status: DC | PRN
  Administered 2023-10-31 (×2): 500 mL

## 2023-10-31 MED ORDER — LIDOCAINE HCL (PF) 1 % IJ SOLN
INTRAMUSCULAR | Status: DC | PRN
  Administered 2023-10-31: 10 mL

## 2023-10-31 MED ORDER — FENTANYL CITRATE (PF) 100 MCG/2ML IJ SOLN
INTRAMUSCULAR | Status: AC
  Filled 2023-10-31: qty 2

## 2023-10-31 MED ORDER — HEPARIN SODIUM (PORCINE) 1000 UNIT/ML IJ SOLN
INTRAMUSCULAR | Status: DC | PRN
  Administered 2023-10-31: 9000 [IU] via INTRAVENOUS

## 2023-10-31 MED ORDER — SODIUM CHLORIDE 0.9% FLUSH: 3.0000 mL | INTRAVENOUS | Status: DC | PRN

## 2023-10-31 MED ORDER — ACETAMINOPHEN 325 MG PO TABS
650.0000 mg | ORAL_TABLET | ORAL | Status: DC | PRN
  Administered 2023-10-31: 650 mg via ORAL

## 2023-10-31 MED ORDER — OXYCODONE HCL 5 MG PO TABS: 5.0000 mg | ORAL_TABLET | ORAL | Status: DC | PRN

## 2023-10-31 MED ORDER — MIDAZOLAM HCL 2 MG/2ML IJ SOLN
INTRAMUSCULAR | Status: AC
  Filled 2023-10-31: qty 2

## 2023-10-31 MED ORDER — GABAPENTIN 300 MG PO CAPS
600.0000 mg | ORAL_CAPSULE | Freq: Two times a day (BID) | ORAL | Status: DC
  Administered 2023-10-31 – 2023-11-01 (×3): 600 mg via ORAL
  Filled 2023-10-31 (×4): qty 2

## 2023-10-31 MED ORDER — CARVEDILOL 25 MG PO TABS
25.0000 mg | ORAL_TABLET | Freq: Two times a day (BID) | ORAL | Status: DC
  Administered 2023-10-31: 25 mg via ORAL
  Filled 2023-10-31: qty 1

## 2023-10-31 MED ORDER — IODIXANOL 320 MG/ML IV SOLN
INTRAVENOUS | Status: DC | PRN
  Administered 2023-10-31: 70 mL

## 2023-10-31 MED ORDER — HYDRALAZINE HCL 20 MG/ML IJ SOLN: 5.0000 mg | INTRAMUSCULAR | Status: DC | PRN

## 2023-10-31 MED ORDER — SODIUM CHLORIDE 0.9% FLUSH: 3.0000 mL | Freq: Two times a day (BID) | INTRAVENOUS | Status: DC

## 2023-10-31 MED ORDER — LIDOCAINE HCL (PF) 1 % IJ SOLN
INTRAMUSCULAR | Status: AC
  Filled 2023-10-31: qty 30

## 2023-10-31 MED ORDER — INSULIN ASPART 100 UNIT/ML IJ SOLN
0.0000 [IU] | Freq: Three times a day (TID) | INTRAMUSCULAR | Status: DC
Start: 2023-10-31 — End: 2023-11-01
  Administered 2023-11-01: 1 [IU] via SUBCUTANEOUS

## 2023-10-31 MED ORDER — ACETAMINOPHEN 325 MG PO TABS
ORAL_TABLET | ORAL | Status: AC
  Filled 2023-10-31: qty 2

## 2023-10-31 MED ORDER — SODIUM CHLORIDE 0.9% FLUSH
3.0000 mL | Freq: Two times a day (BID) | INTRAVENOUS | Status: DC
Start: 2023-10-31 — End: 2023-11-01
  Administered 2023-10-31 – 2023-11-01 (×2): 3 mL via INTRAVENOUS

## 2023-10-31 SURGICAL SUPPLY — 18 items
CATH OMNI FLUSH 5F 65CM (CATHETERS) ×1 IMPLANT
CATH QUICKCROSS .018X135CM (MICROCATHETER) ×1 IMPLANT
CATH SHOCKWAVE E8 3.5X80 (CATHETERS) ×1 IMPLANT
CATH TEMPO AQUA 5F 100CM (CATHETERS) ×1 IMPLANT
COVER DOME SNAP 22 D (MISCELLANEOUS) ×2 IMPLANT
DEVICE CLOSURE MYNXGRIP 5F (Vascular Products) ×1 IMPLANT
GLIDEWIRE ADV .035X260CM (WIRE) ×1 IMPLANT
GUIDEWIRE ANGLED .035X150CM (WIRE) ×1 IMPLANT
KIT ENCORE 26 ADVANTAGE (KITS) ×1 IMPLANT
KIT MICROPUNCTURE NIT STIFF (SHEATH) ×1 IMPLANT
SET ATX-X65L (MISCELLANEOUS) ×1 IMPLANT
SHEATH CATAPULT 5F 45 MP (SHEATH) ×1 IMPLANT
SHEATH PINNACLE 5F 10CM (SHEATH) ×1 IMPLANT
SHEATH PROBE COVER 6X72 (BAG) ×2 IMPLANT
TRAY PV CATH (CUSTOM PROCEDURE TRAY) ×2 IMPLANT
WIRE BENTSON .035X145CM (WIRE) ×1 IMPLANT
WIRE G V18X300CM (WIRE) ×2 IMPLANT
WIRE SPARTACORE .014X300CM (WIRE) ×1 IMPLANT

## 2023-10-31 NOTE — Plan of Care (Signed)
  Problem: Education: Goal: Knowledge of risk factors and measures for prevention of condition will improve Outcome: Progressing   Problem: Coping: Goal: Psychosocial and spiritual needs will be supported Outcome: Progressing   Problem: Respiratory: Goal: Will maintain a patent airway Outcome: Progressing Goal: Complications related to the disease process, condition or treatment will be avoided or minimized Outcome: Progressing   Problem: Education: Goal: Knowledge of General Education information will improve Description: Including pain rating scale, medication(s)/side effects and non-pharmacologic comfort measures Outcome: Progressing   Problem: Health Behavior/Discharge Planning: Goal: Ability to manage health-related needs will improve Outcome: Progressing   Problem: Clinical Measurements: Goal: Ability to maintain clinical measurements within normal limits will improve Outcome: Progressing Goal: Will remain free from infection Outcome: Progressing Goal: Diagnostic test results will improve Outcome: Progressing Goal: Respiratory complications will improve Outcome: Progressing Goal: Cardiovascular complication will be avoided Outcome: Progressing   Problem: Activity: Goal: Risk for activity intolerance will decrease Outcome: Progressing   Problem: Nutrition: Goal: Adequate nutrition will be maintained Outcome: Progressing   Problem: Coping: Goal: Level of anxiety will decrease Outcome: Progressing   Problem: Elimination: Goal: Will not experience complications related to bowel motility Outcome: Progressing Goal: Will not experience complications related to urinary retention Outcome: Progressing   Problem: Pain Management: Goal: General experience of comfort will improve Outcome: Progressing   Problem: Safety: Goal: Ability to remain free from injury will improve Outcome: Progressing   Problem: Skin Integrity: Goal: Risk for impaired skin integrity will  decrease Outcome: Progressing   Problem: Education: Goal: Ability to describe self-care measures that may prevent or decrease complications (Diabetes Survival Skills Education) will improve Outcome: Progressing Goal: Individualized Educational Video(s) Outcome: Progressing   Problem: Coping: Goal: Ability to adjust to condition or change in health will improve Outcome: Progressing   Problem: Fluid Volume: Goal: Ability to maintain a balanced intake and output will improve Outcome: Progressing   Problem: Health Behavior/Discharge Planning: Goal: Ability to identify and utilize available resources and services will improve Outcome: Progressing Goal: Ability to manage health-related needs will improve Outcome: Progressing   Problem: Metabolic: Goal: Ability to maintain appropriate glucose levels will improve Outcome: Progressing   Problem: Nutritional: Goal: Maintenance of adequate nutrition will improve Outcome: Progressing Goal: Progress toward achieving an optimal weight will improve Outcome: Progressing   Problem: Skin Integrity: Goal: Risk for impaired skin integrity will decrease Outcome: Progressing   Problem: Tissue Perfusion: Goal: Adequacy of tissue perfusion will improve Outcome: Progressing   Problem: Education: Goal: Understanding of CV disease, CV risk reduction, and recovery process will improve Outcome: Progressing Goal: Individualized Educational Video(s) Outcome: Progressing   Problem: Activity: Goal: Ability to return to baseline activity level will improve Outcome: Progressing   Problem: Cardiovascular: Goal: Ability to achieve and maintain adequate cardiovascular perfusion will improve Outcome: Progressing Goal: Vascular access site(s) Level 0-1 will be maintained Outcome: Progressing   Problem: Health Behavior/Discharge Planning: Goal: Ability to safely manage health-related needs after discharge will improve Outcome: Progressing
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
## 2023-10-31 NOTE — Progress Notes (Signed)
 Patient received from cathlab, V/S taken, CCMD notified, orientation given about the unit, call bell in reach, left femoral access site is level 0, will continue to monitor.  10/31/23 1507  Vitals  Temp 98.4 F (36.9 C)  Temp Source Oral  BP (!) 169/91  MAP (mmHg) 111  BP Location Right Arm  BP Method Automatic  Patient Position (if appropriate) Lying  Pulse Rate 91  Pulse Rate Source Monitor  ECG Heart Rate 93  Resp 17  MEWS COLOR  MEWS Score Color Green  Oxygen  Therapy  SpO2 94 %  O2 Device Room Air  Pain Assessment  Pain Scale 0-10  Pain Score 0  Glasgow Coma Scale  Eye Opening 4  Best Verbal Response (NON-intubated) 5  Best Motor Response 6  Glasgow Coma Scale Score 15  MEWS Score  MEWS Temp 0  MEWS Systolic 0  MEWS Pulse 0  MEWS RR 0  MEWS LOC 0  MEWS Score 0

## 2023-10-31 NOTE — Op Note (Signed)
 Patient name: William Webb  MRN: [id number] DOB: November 27, 1950 Sex: male  10/31/2023 Pre-operative Diagnosis: Critical limb ischemia right lower extremity with heel wound Post-operative diagnosis:  Same Surgeon:  Lonni DOROTHA Gaskins, MD Procedure Performed: 1.  Ultrasound-guided access left common femoral artery 2.  Aortogram with catheter selection of aorta 3.  Right lower extremity arteriogram with catheter selection of popliteal artery and peroneal artery 4.  Shockwave lithotripsy right TP trunk and peroneal artery (3.5 mm x 80 mm shockwave balloon x 320 pulses) 5.  Mynx closure of the left common femoral artery  Indications: Patient is a 73 year old male with end-stage renal disease as well as previous left BKA.  Seen in the office by Dr. Magda with right heel ulcer.  He presents today for lower extremity arteriogram possible intervention after risks benefits discussed.  Findings:   Aortogram showed no flow-limiting stenosis in the infrarenal aorta.  Both iliacs were widely patent.  Right lower extremity arteriogram showed a patent common femoral and profunda.  The SFA and popliteal artery diffusely diseased but no flow-limiting stenosis.  There is about a 30% stenosis in the proximal SFA.  Distally he has single-vessel peroneal runoff.  The TP trunk had a high-grade 80% calcified stenosis with a second 50% stenosis in the peroneal.  This was all treated with shockwave lithotripsy with a 3.5 mm lithotripsy balloon x 320 pulses.  Preserved peroneal runoff at completion with no residual high grade stenosis.   Procedure:  The patient was identified in the holding area and taken to room 8.  The patient was then placed supine on the table and prepped and draped in the usual sterile fashion.  A time out was called.  Ultrasound was used to evaluate the left common femoral artery.  It was patent .  A digital ultrasound image was acquired.  A micropuncture needle was used to access the left  common femoral artery under ultrasound guidance.  An 018 wire was advanced without resistance and a micropuncture sheath was placed.  The 018 wire was removed and a benson wire was placed.  The micropuncture sheath was exchanged for a 5 french sheath.  An omniflush catheter was advanced over the wire to the level of L-1.  An abdominal angiogram was obtained.  Next, using the omniflush catheter and a benson wire, the aortic bifurcation was crossed and the catheter was placed into theright external iliac artery and right runoff was obtained.  Ultimately after evaluating images elected to intervene on his right TP trunk and peroneal artery.  I then used a Glidewire advantage down the right SFA and upsized to a 5 French catapult sheath in the left groin over the aortic bifurcation.  Patient was given 100 units/kg IV heparin .  I then went down the SFA using the Glidewire advantage and used a straight catheter exchanged for a V18 wire and then a [place] core wire and got my wire down the peroneal artery.  This was difficult due to multiple branches.  I then selected a 3.5 mm x 80 mm shockwave lithotripsy balloon and this was inflated to 4 atm across both lesions in both the TP trunk and peroneal artery and 320 pulses were performed.  No significant residual stenosis.  Preserved runoff.  Wires and catheters were removed.  Short 5 French sheath placed in the left groin.  Mynx closure device deployed.  Plan: Will admit for observation as he has no transportation and no one to stay with him tonight.  Aspirin  statin  and Eliquis .       Lonni DOROTHA Gaskins, MD [facility] Office: [phone]

## 2023-10-31 NOTE — Telephone Encounter (Signed)
 Pt c/o medication issue:  1. Name of Medication: Eliquis   2. How are you currently taking this medication (dosage and times per day)? As Written  3. Are you having a reaction (difficulty breathing--STAT)? No  4. What is your medication issue? Patient's daughter is calling because she was looking over the patient's medication list. Patient's daughter stated the patient has been taking this medication, but she didn't see it on the list. Patient's daughter would like to know if he should stop taking this medication or continue it. Please advise.

## 2023-10-31 NOTE — Progress Notes (Signed)
 Patient's blood pressure was high, scheduled antihypertensive meds given,patient denies any symptoms of headache, dizziness, lightheadedness.   10/31/23 1544  Vitals  BP (!) 181/95  MAP (mmHg) 117  BP Location Right Arm  BP Method Automatic  Patient Position (if appropriate) Lying  Pulse Rate 94  Pulse Rate Source Monitor  ECG Heart Rate 95  Resp 16  Level of Consciousness  Level of Consciousness Alert  MEWS COLOR  MEWS Score Color Green  Oxygen  Therapy  SpO2 93 %  O2 Device Room Air  MEWS Score  MEWS Temp 0  MEWS Systolic 0  MEWS Pulse 0  MEWS RR 0  MEWS LOC 0  MEWS Score 0

## 2023-10-31 NOTE — Telephone Encounter (Signed)
 I spoke with patient's daughter who is asking if patient should continue Eliquis .  Daughter reports patient has been taking but she is not able to see it listed in patient's my chart and is asking when it was stopped.  I let daughter know it was on patient's medication list when he was recently discharged from the hospital on 12/25.  Patient is currently in the hospital.  I advised her to check with staff treating patient at the hospital regarding whether patient should continue Eliquis 

## 2023-10-31 NOTE — Plan of Care (Signed)
  Problem: Respiratory: Goal: Complications related to the disease process, condition or treatment will be avoided or minimized Outcome: Progressing   Problem: Respiratory: Goal: Will maintain a patent airway Outcome: Progressing   Problem: Education: Goal: Knowledge of General Education information will improve Description: Including pain rating scale, medication(s)/side effects and non-pharmacologic comfort measures Outcome: Progressing   Problem: Clinical Measurements: Goal: Respiratory complications will improve Outcome: Progressing   Problem: Clinical Measurements: Goal: Diagnostic test results will improve Outcome: Progressing   Problem: Clinical Measurements: Goal: Will remain free from infection Outcome: Progressing   Problem: Clinical Measurements: Goal: Ability to maintain clinical measurements within normal limits will improve Outcome: Progressing

## 2023-11-01 ENCOUNTER — Encounter (HOSPITAL_COMMUNITY): Payer: Self-pay

## 2023-11-01 ENCOUNTER — Other Ambulatory Visit (HOSPITAL_COMMUNITY): Payer: Self-pay

## 2023-11-01 ENCOUNTER — Encounter (HOSPITAL_COMMUNITY): Payer: Self-pay | Admitting: Vascular Surgery

## 2023-11-01 DIAGNOSIS — Z794 Long term (current) use of insulin: Secondary | ICD-10-CM | POA: Diagnosis not present

## 2023-11-01 DIAGNOSIS — I70234 Atherosclerosis of native arteries of right leg with ulceration of heel and midfoot: Secondary | ICD-10-CM | POA: Diagnosis not present

## 2023-11-01 DIAGNOSIS — D509 Iron deficiency anemia, unspecified: Secondary | ICD-10-CM | POA: Diagnosis not present

## 2023-11-01 DIAGNOSIS — Z7982 Long term (current) use of aspirin: Secondary | ICD-10-CM | POA: Diagnosis not present

## 2023-11-01 DIAGNOSIS — I509 Heart failure, unspecified: Secondary | ICD-10-CM | POA: Diagnosis not present

## 2023-11-01 DIAGNOSIS — E1122 Type 2 diabetes mellitus with diabetic chronic kidney disease: Secondary | ICD-10-CM | POA: Diagnosis not present

## 2023-11-01 DIAGNOSIS — Z79899 Other long term (current) drug therapy: Secondary | ICD-10-CM | POA: Diagnosis not present

## 2023-11-01 DIAGNOSIS — I2510 Atherosclerotic heart disease of native coronary artery without angina pectoris: Secondary | ICD-10-CM | POA: Diagnosis not present

## 2023-11-01 DIAGNOSIS — I4819 Other persistent atrial fibrillation: Secondary | ICD-10-CM | POA: Diagnosis not present

## 2023-11-01 DIAGNOSIS — N186 End stage renal disease: Secondary | ICD-10-CM | POA: Diagnosis not present

## 2023-11-01 DIAGNOSIS — Z87891 Personal history of nicotine dependence: Secondary | ICD-10-CM | POA: Diagnosis not present

## 2023-11-01 DIAGNOSIS — I70223 Atherosclerosis of native arteries of extremities with rest pain, bilateral legs: Secondary | ICD-10-CM | POA: Diagnosis not present

## 2023-11-01 DIAGNOSIS — Z992 Dependence on renal dialysis: Secondary | ICD-10-CM | POA: Diagnosis not present

## 2023-11-01 DIAGNOSIS — D631 Anemia in chronic kidney disease: Secondary | ICD-10-CM | POA: Diagnosis not present

## 2023-11-01 DIAGNOSIS — Z96651 Presence of right artificial knee joint: Secondary | ICD-10-CM | POA: Diagnosis not present

## 2023-11-01 DIAGNOSIS — Z7901 Long term (current) use of anticoagulants: Secondary | ICD-10-CM | POA: Diagnosis not present

## 2023-11-01 DIAGNOSIS — N2581 Secondary hyperparathyroidism of renal origin: Secondary | ICD-10-CM | POA: Diagnosis not present

## 2023-11-01 LAB — LIPID PANEL
Cholesterol: 95 mg/dL (ref 0–200)
HDL: 29 mg/dL — ABNORMAL LOW (ref >40)
LDL Cholesterol: 49 mg/dL (ref 0–99)
Total CHOL/HDL Ratio: 3.3 {ratio}
Triglycerides: 87 mg/dL (ref <150)
VLDL: 17 mg/dL (ref 0–40)

## 2023-11-01 LAB — GLUCOSE, CAPILLARY
Glucose-Capillary: 161 mg/dL — ABNORMAL HIGH (ref 70–99)
Glucose-Capillary: 163 mg/dL — ABNORMAL HIGH (ref 70–99)

## 2023-11-01 MED ORDER — APIXABAN 5 MG PO TABS
5.0000 mg | ORAL_TABLET | Freq: Two times a day (BID) | ORAL | 1 refills | Status: DC
  Filled 2023-11-01: qty 60, 30d supply, fill #0

## 2023-11-01 MED ORDER — OXYCODONE HCL 5 MG PO TABS
5.0000 mg | ORAL_TABLET | ORAL | 0 refills | Status: DC | PRN
  Filled 2023-11-01: qty 12, 1d supply, fill #0

## 2023-11-01 NOTE — Progress Notes (Signed)
 Spoke with provider regarding if patient would have dialysis today as he will miss OP scheduled day due to being in hospital today 11/01/2023. She stated that Nephrology has been called and will assist. Patient updated. Ewen Varnell, Randall An RN
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
## 2023-11-01 NOTE — Progress Notes (Addendum)
 [facility] [place]  Subjective  - no new complaints.  He states he is out of Eliquis  at home.     Objective (!) 155/86 72 99.2 F (37.3 C) (Oral) 20 100%  Intake/Output Summary (Last 24 hours) at 11/01/2023 0757 Last data filed at 10/31/2023 2000 Gross per 24 hour  Intake 360 ml  Output --  Net 360 ml    Right LE with brisk peroneal/DP signal Left groin soft without hematoma Lungs non labored breathing  Assessment/Planning: CLI with non healing wound on the heel POD # 1   Shockwave lithotripsy right TP trunk and peroneal artery (3.5 mm x 80 mm shockwave balloon x 320 pulses)   Improved inflow to the foot with doppler Peroneal/DP to assist with wound healing.  He is followed by Dr. Harden OP for the heel wound.  He will f/u with our office in 4 weeks with right LE arterial duplex.   He will be discharged on Eliquis  , ASA, and Statin.    Maurilio Deland Collet 11/01/2023 7:57 AM --  Laboratory Lab Results: Recent Labs    10/31/23 0711  HGB 11.6*  HCT 34.0*   BMET Recent Labs    10/31/23 0711  NA 137  K 4.7  CL 93*  GLUCOSE 100*  BUN 34*  CREATININE 5.70*    COAG Lab Results  Component Value Date   INR 1.3 (H) 10/19/2023   INR 1.4 (H) 05/20/2023   INR 1.4 (H) 03/21/2023   No results found for: PTT   I have seen and evaluated the patient. I agree with the PA note as documented above.  73 year old male with end-stage renal disease that underwent right leg angiogram yesterday with shockwave lithotripsy of the TP trunk and peroneal artery for high-grade stenosis in setting of heel ulcer.  Good technical results.  Optimized from vascular.  Left groin looks good.  Brisk right peroneal signal at the ankle.  Discussed aspirin  statin and Eliquis  from our standpoint that he was taking before.  He has follow-up with Dr. Harden as an outpatient.  Okay for discharge from our standpoint will arrange follow-up in 1 month with right leg arterial  duplex.  He was admitted for social reasons.  Unfortunately he canceled his outpatient dialysis session today.  Will see if we can get assistance from nephrology to dialyze him before discharge.  Lonni DOROTHA Gaskins, MD [facility] [place] Office: [phone]

## 2023-11-01 NOTE — Progress Notes (Addendum)
 Contacted by nephrologist his morning regarding pt receiving out-pt HD today at pt's regular clinic. Contacted [facility] and spoke to Consulting Civil Engineer. Clinic can treat pt today if pt arrives 10:30 for 10:45 am chair time. Advised staff that may be difficult and inquired if pt could arrive as late at 11:30 am and still receive treatment. Charge RN states that would be possible. Met with pt at bedside and discussed the above info. Pt trying to reach family to see if they can transport pt from hospital to HD clinic for today's HD treatment. Update provided to nephrologist, pt's RN, and RN CM. Inpt HD unit staff aware of situation as well. Clinic aware pt is working on transportation and navigator will f/u once it is know if pt can locate transportation.   Randine Mungo Renal Navigator [phone]  Addendum at 10:31 am: Received word from pt's RN that pt has found a ride and they are on their way to get pt. Spoke to pt to confirm and advised pt that navigator will call clinic to make them aware that pt will be there shortly. Update provided to clinic and nephrologist. Inpt HD unit charge RN also made aware that pt will d/c and go to clinic for HD.

## 2023-11-01 NOTE — Progress Notes (Signed)
 William Webb  to be D/C'd Home per MD order.  Discussed with the patient and all questions fully answered.  VSS, Skin clean, dry and intact without evidence of skin break down, no evidence of skin tears noted. IV catheter discontinued intact. Site without signs and symptoms of complications. Dressing and pressure applied.  An After Visit Summary was printed and given to the patient. Patient received prescription.  D/c education completed with patient/family including follow up instructions, medication list, d/c activities limitations if indicated, with other d/c instructions as indicated by MD - patient able to verbalize understanding, all questions fully answered.   Patient instructed to return to ED, call 911, or call MD for any changes in condition.   Patient escorted via WC, and D/C home via private auto.  [name] Anabelen Kaminsky 11/01/2023 10:43 AM

## 2023-11-01 NOTE — Discharge Instructions (Signed)
   [facility]  Discharge Instructions  Lower Extremity Angiogram; Angioplasty/Stenting  Please refer to the following instructions for your post-procedure care. Your surgeon or physician assistant will discuss any changes with you.  Activity  Avoid lifting more than 8 pounds (1 gallons of milk) for 72 hours (3 days) after your procedure. You may walk as much as you can tolerate. It's OK to drive after 72 hours.  Bathing/Showering  You may shower the day after your procedure. If you have a bandage, you may remove it at 24- 48 hours. Clean your incision site with mild soap and water . Pat the area dry with a clean towel.  Diet  Resume your pre-procedure diet. There are no special food restrictions following this procedure. All patients with peripheral vascular disease should follow a low fat/low cholesterol diet. In order to heal from your surgery, it is CRITICAL to get adequate nutrition. Your body requires vitamins, minerals, and protein. Vegetables are the best source of vitamins and minerals. Vegetables also provide the perfect balance of protein. Processed food has little nutritional value, so try to avoid this.  Medications  Resume taking all of your medications unless your doctor tells you not to. If your incision is causing pain, you may take over-the-counter pain relievers such as acetaminophen  (Tylenol )  Follow Up  Follow up will be arranged at the time of your procedure. You may have an office visit scheduled or may be scheduled for surgery. Ask your surgeon if you have any questions.  Please call us  immediately for any of the following conditions: Severe or worsening pain your legs or feet at rest or with walking. Increased pain, redness, drainage at your groin puncture site. Fever of 101 degrees or higher. If you have any mild or slow bleeding from your puncture site: lie down, apply firm constant pressure over the area with a piece of  gauze or a clean wash cloth for 30 minutes- no peeking!, call 911 right away if you are still bleeding after 30 minutes, or if the bleeding is heavy and unmanageable.  Reduce your risk factors of vascular disease:  Stop smoking. If you would like help call [facility] at 1-800-QUIT-NOW (503-319-6582) or [place] at [phone]. Manage your cholesterol Maintain a desired weight Control your diabetes Keep your blood pressure down  If you have any questions, please call the office at [phone]

## 2023-11-04 DIAGNOSIS — D509 Iron deficiency anemia, unspecified: Secondary | ICD-10-CM | POA: Diagnosis not present

## 2023-11-04 DIAGNOSIS — Z992 Dependence on renal dialysis: Secondary | ICD-10-CM | POA: Diagnosis not present

## 2023-11-04 DIAGNOSIS — N186 End stage renal disease: Secondary | ICD-10-CM | POA: Diagnosis not present

## 2023-11-04 DIAGNOSIS — N2581 Secondary hyperparathyroidism of renal origin: Secondary | ICD-10-CM | POA: Diagnosis not present

## 2023-11-04 DIAGNOSIS — D631 Anemia in chronic kidney disease: Secondary | ICD-10-CM | POA: Diagnosis not present

## 2023-11-04 MED FILL — Midazolam HCl Inj 2 MG/2ML (Base Equivalent): INTRAMUSCULAR | Qty: 2 | Status: AC

## 2023-11-04 MED FILL — Fentanyl Citrate Preservative Free (PF) Inj 100 MCG/2ML: INTRAMUSCULAR | Qty: 2 | Status: AC

## 2023-11-05 ENCOUNTER — Telehealth: Payer: Self-pay

## 2023-11-05 ENCOUNTER — Telehealth: Payer: Self-pay | Admitting: Cardiology

## 2023-11-05 NOTE — Telephone Encounter (Signed)
 Pt's daughter calling to f/u on Pt Assistance for Eliquis. Please advise

## 2023-11-05 NOTE — Telephone Encounter (Signed)
 Spoke with the pt and emergency contact and informed them both that we mailed him the pt assistance application form to his mailing address to complete and return to the front office, at their earliest convenience.   William Webb verbalized understanding and agrees with this plan.  William Webb was gracious for all the assistance provided.

## 2023-11-05 NOTE — Telephone Encounter (Addendum)
 Have not received any paperwork for this patient. PAP: PAP application for Eliquis, ([employer] ([employer] has been mailed to pt's home address on file. Will fax provider portion of application to provider's office when pt's portion is received.

## 2023-11-05 NOTE — Telephone Encounter (Signed)
 I have not received anything for this patient. Mailing them a BMS application and instructions for Eliquis

## 2023-11-06 DIAGNOSIS — N186 End stage renal disease: Secondary | ICD-10-CM | POA: Diagnosis not present

## 2023-11-06 DIAGNOSIS — D631 Anemia in chronic kidney disease: Secondary | ICD-10-CM | POA: Diagnosis not present

## 2023-11-06 DIAGNOSIS — D509 Iron deficiency anemia, unspecified: Secondary | ICD-10-CM | POA: Diagnosis not present

## 2023-11-06 DIAGNOSIS — Z992 Dependence on renal dialysis: Secondary | ICD-10-CM | POA: Diagnosis not present

## 2023-11-06 DIAGNOSIS — N2581 Secondary hyperparathyroidism of renal origin: Secondary | ICD-10-CM | POA: Diagnosis not present

## 2023-11-07 DIAGNOSIS — Z89511 Acquired absence of right leg below knee: Secondary | ICD-10-CM | POA: Diagnosis not present

## 2023-11-07 DIAGNOSIS — E1151 Type 2 diabetes mellitus with diabetic peripheral angiopathy without gangrene: Secondary | ICD-10-CM | POA: Diagnosis not present

## 2023-11-07 DIAGNOSIS — D631 Anemia in chronic kidney disease: Secondary | ICD-10-CM | POA: Diagnosis not present

## 2023-11-07 DIAGNOSIS — E11621 Type 2 diabetes mellitus with foot ulcer: Secondary | ICD-10-CM | POA: Diagnosis not present

## 2023-11-07 DIAGNOSIS — I2510 Atherosclerotic heart disease of native coronary artery without angina pectoris: Secondary | ICD-10-CM | POA: Diagnosis not present

## 2023-11-07 DIAGNOSIS — E1122 Type 2 diabetes mellitus with diabetic chronic kidney disease: Secondary | ICD-10-CM | POA: Diagnosis not present

## 2023-11-07 DIAGNOSIS — M159 Polyosteoarthritis, unspecified: Secondary | ICD-10-CM | POA: Diagnosis not present

## 2023-11-07 DIAGNOSIS — H35033 Hypertensive retinopathy, bilateral: Secondary | ICD-10-CM | POA: Diagnosis not present

## 2023-11-07 DIAGNOSIS — I480 Paroxysmal atrial fibrillation: Secondary | ICD-10-CM | POA: Diagnosis not present

## 2023-11-07 DIAGNOSIS — I132 Hypertensive heart and chronic kidney disease with heart failure and with stage 5 chronic kidney disease, or end stage renal disease: Secondary | ICD-10-CM | POA: Diagnosis not present

## 2023-11-07 DIAGNOSIS — E1140 Type 2 diabetes mellitus with diabetic neuropathy, unspecified: Secondary | ICD-10-CM | POA: Diagnosis not present

## 2023-11-07 DIAGNOSIS — L97412 Non-pressure chronic ulcer of right heel and midfoot with fat layer exposed: Secondary | ICD-10-CM | POA: Diagnosis not present

## 2023-11-07 DIAGNOSIS — N186 End stage renal disease: Secondary | ICD-10-CM | POA: Diagnosis not present

## 2023-11-07 DIAGNOSIS — I5032 Chronic diastolic (congestive) heart failure: Secondary | ICD-10-CM | POA: Diagnosis not present

## 2023-11-07 DIAGNOSIS — Z992 Dependence on renal dialysis: Secondary | ICD-10-CM | POA: Diagnosis not present

## 2023-11-07 DIAGNOSIS — J302 Other seasonal allergic rhinitis: Secondary | ICD-10-CM | POA: Diagnosis not present

## 2023-11-07 DIAGNOSIS — E46 Unspecified protein-calorie malnutrition: Secondary | ICD-10-CM | POA: Diagnosis not present

## 2023-11-07 DIAGNOSIS — I700 Atherosclerosis of aorta: Secondary | ICD-10-CM | POA: Diagnosis not present

## 2023-11-07 DIAGNOSIS — M810 Age-related osteoporosis without current pathological fracture: Secondary | ICD-10-CM | POA: Diagnosis not present

## 2023-11-07 DIAGNOSIS — E113411 Type 2 diabetes mellitus with severe nonproliferative diabetic retinopathy with macular edema, right eye: Secondary | ICD-10-CM | POA: Diagnosis not present

## 2023-11-07 DIAGNOSIS — M797 Fibromyalgia: Secondary | ICD-10-CM | POA: Diagnosis not present

## 2023-11-08 DIAGNOSIS — N2581 Secondary hyperparathyroidism of renal origin: Secondary | ICD-10-CM | POA: Diagnosis not present

## 2023-11-08 DIAGNOSIS — D631 Anemia in chronic kidney disease: Secondary | ICD-10-CM | POA: Diagnosis not present

## 2023-11-08 DIAGNOSIS — Z992 Dependence on renal dialysis: Secondary | ICD-10-CM | POA: Diagnosis not present

## 2023-11-08 DIAGNOSIS — D509 Iron deficiency anemia, unspecified: Secondary | ICD-10-CM | POA: Diagnosis not present

## 2023-11-08 DIAGNOSIS — N186 End stage renal disease: Secondary | ICD-10-CM | POA: Diagnosis not present

## 2023-11-11 DIAGNOSIS — N2581 Secondary hyperparathyroidism of renal origin: Secondary | ICD-10-CM | POA: Diagnosis not present

## 2023-11-11 DIAGNOSIS — Z992 Dependence on renal dialysis: Secondary | ICD-10-CM | POA: Diagnosis not present

## 2023-11-11 DIAGNOSIS — N186 End stage renal disease: Secondary | ICD-10-CM | POA: Diagnosis not present

## 2023-11-11 DIAGNOSIS — D631 Anemia in chronic kidney disease: Secondary | ICD-10-CM | POA: Diagnosis not present

## 2023-11-11 DIAGNOSIS — D509 Iron deficiency anemia, unspecified: Secondary | ICD-10-CM | POA: Diagnosis not present

## 2023-11-13 DIAGNOSIS — D509 Iron deficiency anemia, unspecified: Secondary | ICD-10-CM | POA: Diagnosis not present

## 2023-11-13 DIAGNOSIS — N186 End stage renal disease: Secondary | ICD-10-CM | POA: Diagnosis not present

## 2023-11-13 DIAGNOSIS — N2581 Secondary hyperparathyroidism of renal origin: Secondary | ICD-10-CM | POA: Diagnosis not present

## 2023-11-13 DIAGNOSIS — D631 Anemia in chronic kidney disease: Secondary | ICD-10-CM | POA: Diagnosis not present

## 2023-11-13 DIAGNOSIS — Z992 Dependence on renal dialysis: Secondary | ICD-10-CM | POA: Diagnosis not present

## 2023-11-14 DIAGNOSIS — I70223 Atherosclerosis of native arteries of extremities with rest pain, bilateral legs: Secondary | ICD-10-CM | POA: Diagnosis not present

## 2023-11-14 DIAGNOSIS — H35033 Hypertensive retinopathy, bilateral: Secondary | ICD-10-CM | POA: Diagnosis not present

## 2023-11-14 DIAGNOSIS — I5032 Chronic diastolic (congestive) heart failure: Secondary | ICD-10-CM | POA: Diagnosis not present

## 2023-11-14 DIAGNOSIS — Z89512 Acquired absence of left leg below knee: Secondary | ICD-10-CM | POA: Diagnosis not present

## 2023-11-14 DIAGNOSIS — L97529 Non-pressure chronic ulcer of other part of left foot with unspecified severity: Secondary | ICD-10-CM | POA: Diagnosis not present

## 2023-11-14 DIAGNOSIS — E11621 Type 2 diabetes mellitus with foot ulcer: Secondary | ICD-10-CM | POA: Diagnosis not present

## 2023-11-14 DIAGNOSIS — N186 End stage renal disease: Secondary | ICD-10-CM | POA: Diagnosis not present

## 2023-11-14 DIAGNOSIS — D6869 Other thrombophilia: Secondary | ICD-10-CM | POA: Diagnosis not present

## 2023-11-15 DIAGNOSIS — L97412 Non-pressure chronic ulcer of right heel and midfoot with fat layer exposed: Secondary | ICD-10-CM | POA: Diagnosis not present

## 2023-11-15 DIAGNOSIS — H35033 Hypertensive retinopathy, bilateral: Secondary | ICD-10-CM | POA: Diagnosis not present

## 2023-11-15 DIAGNOSIS — Z992 Dependence on renal dialysis: Secondary | ICD-10-CM | POA: Diagnosis not present

## 2023-11-15 DIAGNOSIS — D509 Iron deficiency anemia, unspecified: Secondary | ICD-10-CM | POA: Diagnosis not present

## 2023-11-15 DIAGNOSIS — I2510 Atherosclerotic heart disease of native coronary artery without angina pectoris: Secondary | ICD-10-CM | POA: Diagnosis not present

## 2023-11-15 DIAGNOSIS — M797 Fibromyalgia: Secondary | ICD-10-CM | POA: Diagnosis not present

## 2023-11-15 DIAGNOSIS — M159 Polyosteoarthritis, unspecified: Secondary | ICD-10-CM | POA: Diagnosis not present

## 2023-11-15 DIAGNOSIS — I5032 Chronic diastolic (congestive) heart failure: Secondary | ICD-10-CM | POA: Diagnosis not present

## 2023-11-15 DIAGNOSIS — E1151 Type 2 diabetes mellitus with diabetic peripheral angiopathy without gangrene: Secondary | ICD-10-CM | POA: Diagnosis not present

## 2023-11-15 DIAGNOSIS — E11621 Type 2 diabetes mellitus with foot ulcer: Secondary | ICD-10-CM | POA: Diagnosis not present

## 2023-11-15 DIAGNOSIS — N2581 Secondary hyperparathyroidism of renal origin: Secondary | ICD-10-CM | POA: Diagnosis not present

## 2023-11-15 DIAGNOSIS — N186 End stage renal disease: Secondary | ICD-10-CM | POA: Diagnosis not present

## 2023-11-15 DIAGNOSIS — M810 Age-related osteoporosis without current pathological fracture: Secondary | ICD-10-CM | POA: Diagnosis not present

## 2023-11-15 DIAGNOSIS — I132 Hypertensive heart and chronic kidney disease with heart failure and with stage 5 chronic kidney disease, or end stage renal disease: Secondary | ICD-10-CM | POA: Diagnosis not present

## 2023-11-15 DIAGNOSIS — J302 Other seasonal allergic rhinitis: Secondary | ICD-10-CM | POA: Diagnosis not present

## 2023-11-15 DIAGNOSIS — Z89511 Acquired absence of right leg below knee: Secondary | ICD-10-CM | POA: Diagnosis not present

## 2023-11-15 DIAGNOSIS — I480 Paroxysmal atrial fibrillation: Secondary | ICD-10-CM | POA: Diagnosis not present

## 2023-11-15 DIAGNOSIS — E46 Unspecified protein-calorie malnutrition: Secondary | ICD-10-CM | POA: Diagnosis not present

## 2023-11-15 DIAGNOSIS — E1140 Type 2 diabetes mellitus with diabetic neuropathy, unspecified: Secondary | ICD-10-CM | POA: Diagnosis not present

## 2023-11-15 DIAGNOSIS — D631 Anemia in chronic kidney disease: Secondary | ICD-10-CM | POA: Diagnosis not present

## 2023-11-15 DIAGNOSIS — E113411 Type 2 diabetes mellitus with severe nonproliferative diabetic retinopathy with macular edema, right eye: Secondary | ICD-10-CM | POA: Diagnosis not present

## 2023-11-15 DIAGNOSIS — E1122 Type 2 diabetes mellitus with diabetic chronic kidney disease: Secondary | ICD-10-CM | POA: Diagnosis not present

## 2023-11-15 DIAGNOSIS — I700 Atherosclerosis of aorta: Secondary | ICD-10-CM | POA: Diagnosis not present

## 2023-11-18 DIAGNOSIS — N186 End stage renal disease: Secondary | ICD-10-CM | POA: Diagnosis not present

## 2023-11-18 DIAGNOSIS — Z992 Dependence on renal dialysis: Secondary | ICD-10-CM | POA: Diagnosis not present

## 2023-11-18 DIAGNOSIS — D631 Anemia in chronic kidney disease: Secondary | ICD-10-CM | POA: Diagnosis not present

## 2023-11-18 DIAGNOSIS — D509 Iron deficiency anemia, unspecified: Secondary | ICD-10-CM | POA: Diagnosis not present

## 2023-11-18 DIAGNOSIS — N2581 Secondary hyperparathyroidism of renal origin: Secondary | ICD-10-CM | POA: Diagnosis not present

## 2023-11-19 DIAGNOSIS — D631 Anemia in chronic kidney disease: Secondary | ICD-10-CM | POA: Diagnosis not present

## 2023-11-19 DIAGNOSIS — E1140 Type 2 diabetes mellitus with diabetic neuropathy, unspecified: Secondary | ICD-10-CM | POA: Diagnosis not present

## 2023-11-19 DIAGNOSIS — J302 Other seasonal allergic rhinitis: Secondary | ICD-10-CM | POA: Diagnosis not present

## 2023-11-19 DIAGNOSIS — I700 Atherosclerosis of aorta: Secondary | ICD-10-CM | POA: Diagnosis not present

## 2023-11-19 DIAGNOSIS — I132 Hypertensive heart and chronic kidney disease with heart failure and with stage 5 chronic kidney disease, or end stage renal disease: Secondary | ICD-10-CM | POA: Diagnosis not present

## 2023-11-19 DIAGNOSIS — M159 Polyosteoarthritis, unspecified: Secondary | ICD-10-CM | POA: Diagnosis not present

## 2023-11-19 DIAGNOSIS — N186 End stage renal disease: Secondary | ICD-10-CM | POA: Diagnosis not present

## 2023-11-19 DIAGNOSIS — H35033 Hypertensive retinopathy, bilateral: Secondary | ICD-10-CM | POA: Diagnosis not present

## 2023-11-19 DIAGNOSIS — I5032 Chronic diastolic (congestive) heart failure: Secondary | ICD-10-CM | POA: Diagnosis not present

## 2023-11-19 DIAGNOSIS — E1151 Type 2 diabetes mellitus with diabetic peripheral angiopathy without gangrene: Secondary | ICD-10-CM | POA: Diagnosis not present

## 2023-11-19 DIAGNOSIS — E113411 Type 2 diabetes mellitus with severe nonproliferative diabetic retinopathy with macular edema, right eye: Secondary | ICD-10-CM | POA: Diagnosis not present

## 2023-11-19 DIAGNOSIS — E46 Unspecified protein-calorie malnutrition: Secondary | ICD-10-CM | POA: Diagnosis not present

## 2023-11-19 DIAGNOSIS — I480 Paroxysmal atrial fibrillation: Secondary | ICD-10-CM | POA: Diagnosis not present

## 2023-11-19 DIAGNOSIS — M797 Fibromyalgia: Secondary | ICD-10-CM | POA: Diagnosis not present

## 2023-11-19 DIAGNOSIS — Z89511 Acquired absence of right leg below knee: Secondary | ICD-10-CM | POA: Diagnosis not present

## 2023-11-19 DIAGNOSIS — L97412 Non-pressure chronic ulcer of right heel and midfoot with fat layer exposed: Secondary | ICD-10-CM | POA: Diagnosis not present

## 2023-11-19 DIAGNOSIS — E1122 Type 2 diabetes mellitus with diabetic chronic kidney disease: Secondary | ICD-10-CM | POA: Diagnosis not present

## 2023-11-19 DIAGNOSIS — M810 Age-related osteoporosis without current pathological fracture: Secondary | ICD-10-CM | POA: Diagnosis not present

## 2023-11-19 DIAGNOSIS — Z992 Dependence on renal dialysis: Secondary | ICD-10-CM | POA: Diagnosis not present

## 2023-11-19 DIAGNOSIS — E11621 Type 2 diabetes mellitus with foot ulcer: Secondary | ICD-10-CM | POA: Diagnosis not present

## 2023-11-19 DIAGNOSIS — I2510 Atherosclerotic heart disease of native coronary artery without angina pectoris: Secondary | ICD-10-CM | POA: Diagnosis not present

## 2023-11-20 DIAGNOSIS — D509 Iron deficiency anemia, unspecified: Secondary | ICD-10-CM | POA: Diagnosis not present

## 2023-11-20 DIAGNOSIS — D631 Anemia in chronic kidney disease: Secondary | ICD-10-CM | POA: Diagnosis not present

## 2023-11-20 DIAGNOSIS — N186 End stage renal disease: Secondary | ICD-10-CM | POA: Diagnosis not present

## 2023-11-20 DIAGNOSIS — N2581 Secondary hyperparathyroidism of renal origin: Secondary | ICD-10-CM | POA: Diagnosis not present

## 2023-11-20 DIAGNOSIS — Z992 Dependence on renal dialysis: Secondary | ICD-10-CM | POA: Diagnosis not present

## 2023-11-21 DIAGNOSIS — E1151 Type 2 diabetes mellitus with diabetic peripheral angiopathy without gangrene: Secondary | ICD-10-CM | POA: Diagnosis not present

## 2023-11-21 DIAGNOSIS — I700 Atherosclerosis of aorta: Secondary | ICD-10-CM | POA: Diagnosis not present

## 2023-11-21 DIAGNOSIS — J302 Other seasonal allergic rhinitis: Secondary | ICD-10-CM | POA: Diagnosis not present

## 2023-11-21 DIAGNOSIS — L97412 Non-pressure chronic ulcer of right heel and midfoot with fat layer exposed: Secondary | ICD-10-CM | POA: Diagnosis not present

## 2023-11-21 DIAGNOSIS — Z992 Dependence on renal dialysis: Secondary | ICD-10-CM | POA: Diagnosis not present

## 2023-11-21 DIAGNOSIS — E11621 Type 2 diabetes mellitus with foot ulcer: Secondary | ICD-10-CM | POA: Diagnosis not present

## 2023-11-21 DIAGNOSIS — M159 Polyosteoarthritis, unspecified: Secondary | ICD-10-CM | POA: Diagnosis not present

## 2023-11-21 DIAGNOSIS — E46 Unspecified protein-calorie malnutrition: Secondary | ICD-10-CM | POA: Diagnosis not present

## 2023-11-21 DIAGNOSIS — E113411 Type 2 diabetes mellitus with severe nonproliferative diabetic retinopathy with macular edema, right eye: Secondary | ICD-10-CM | POA: Diagnosis not present

## 2023-11-21 DIAGNOSIS — M810 Age-related osteoporosis without current pathological fracture: Secondary | ICD-10-CM | POA: Diagnosis not present

## 2023-11-21 DIAGNOSIS — H35033 Hypertensive retinopathy, bilateral: Secondary | ICD-10-CM | POA: Diagnosis not present

## 2023-11-21 DIAGNOSIS — I2510 Atherosclerotic heart disease of native coronary artery without angina pectoris: Secondary | ICD-10-CM | POA: Diagnosis not present

## 2023-11-21 DIAGNOSIS — N186 End stage renal disease: Secondary | ICD-10-CM | POA: Diagnosis not present

## 2023-11-21 DIAGNOSIS — D631 Anemia in chronic kidney disease: Secondary | ICD-10-CM | POA: Diagnosis not present

## 2023-11-21 DIAGNOSIS — I5032 Chronic diastolic (congestive) heart failure: Secondary | ICD-10-CM | POA: Diagnosis not present

## 2023-11-21 DIAGNOSIS — E1122 Type 2 diabetes mellitus with diabetic chronic kidney disease: Secondary | ICD-10-CM | POA: Diagnosis not present

## 2023-11-21 DIAGNOSIS — Z89511 Acquired absence of right leg below knee: Secondary | ICD-10-CM | POA: Diagnosis not present

## 2023-11-21 DIAGNOSIS — I480 Paroxysmal atrial fibrillation: Secondary | ICD-10-CM | POA: Diagnosis not present

## 2023-11-21 DIAGNOSIS — M797 Fibromyalgia: Secondary | ICD-10-CM | POA: Diagnosis not present

## 2023-11-21 DIAGNOSIS — I132 Hypertensive heart and chronic kidney disease with heart failure and with stage 5 chronic kidney disease, or end stage renal disease: Secondary | ICD-10-CM | POA: Diagnosis not present

## 2023-11-21 DIAGNOSIS — E1140 Type 2 diabetes mellitus with diabetic neuropathy, unspecified: Secondary | ICD-10-CM | POA: Diagnosis not present

## 2023-11-22 DIAGNOSIS — D631 Anemia in chronic kidney disease: Secondary | ICD-10-CM | POA: Diagnosis not present

## 2023-11-22 DIAGNOSIS — N186 End stage renal disease: Secondary | ICD-10-CM | POA: Diagnosis not present

## 2023-11-22 DIAGNOSIS — Z992 Dependence on renal dialysis: Secondary | ICD-10-CM | POA: Diagnosis not present

## 2023-11-22 DIAGNOSIS — D509 Iron deficiency anemia, unspecified: Secondary | ICD-10-CM | POA: Diagnosis not present

## 2023-11-22 DIAGNOSIS — N2581 Secondary hyperparathyroidism of renal origin: Secondary | ICD-10-CM | POA: Diagnosis not present

## 2023-11-25 DIAGNOSIS — N2581 Secondary hyperparathyroidism of renal origin: Secondary | ICD-10-CM | POA: Diagnosis not present

## 2023-11-25 DIAGNOSIS — N186 End stage renal disease: Secondary | ICD-10-CM | POA: Diagnosis not present

## 2023-11-25 DIAGNOSIS — Z992 Dependence on renal dialysis: Secondary | ICD-10-CM | POA: Diagnosis not present

## 2023-11-25 DIAGNOSIS — D631 Anemia in chronic kidney disease: Secondary | ICD-10-CM | POA: Diagnosis not present

## 2023-11-25 DIAGNOSIS — D509 Iron deficiency anemia, unspecified: Secondary | ICD-10-CM | POA: Diagnosis not present

## 2023-11-27 DIAGNOSIS — Z992 Dependence on renal dialysis: Secondary | ICD-10-CM | POA: Diagnosis not present

## 2023-11-27 DIAGNOSIS — D509 Iron deficiency anemia, unspecified: Secondary | ICD-10-CM | POA: Diagnosis not present

## 2023-11-27 DIAGNOSIS — D631 Anemia in chronic kidney disease: Secondary | ICD-10-CM | POA: Diagnosis not present

## 2023-11-27 DIAGNOSIS — N186 End stage renal disease: Secondary | ICD-10-CM | POA: Diagnosis not present

## 2023-11-27 DIAGNOSIS — N2581 Secondary hyperparathyroidism of renal origin: Secondary | ICD-10-CM | POA: Diagnosis not present

## 2023-11-28 ENCOUNTER — Ambulatory Visit: Payer: Medicare Other | Admitting: Podiatry

## 2023-11-28 ENCOUNTER — Encounter: Payer: Self-pay | Admitting: Cardiology

## 2023-11-28 ENCOUNTER — Telehealth: Payer: Self-pay

## 2023-11-28 ENCOUNTER — Encounter: Payer: Self-pay | Admitting: Podiatry

## 2023-11-28 ENCOUNTER — Ambulatory Visit: Payer: Medicare Other | Attending: Cardiology | Admitting: Cardiology

## 2023-11-28 VITALS — BP 147/75 | HR 73 | Resp 16 | Ht 75.0 in | Wt 200.0 lb

## 2023-11-28 DIAGNOSIS — E11622 Type 2 diabetes mellitus with other skin ulcer: Secondary | ICD-10-CM

## 2023-11-28 DIAGNOSIS — E782 Mixed hyperlipidemia: Secondary | ICD-10-CM

## 2023-11-28 DIAGNOSIS — L97309 Non-pressure chronic ulcer of unspecified ankle with unspecified severity: Secondary | ICD-10-CM

## 2023-11-28 DIAGNOSIS — I999 Unspecified disorder of circulatory system: Secondary | ICD-10-CM

## 2023-11-28 DIAGNOSIS — I10 Essential (primary) hypertension: Secondary | ICD-10-CM

## 2023-11-28 DIAGNOSIS — I70221 Atherosclerosis of native arteries of extremities with rest pain, right leg: Secondary | ICD-10-CM

## 2023-11-28 DIAGNOSIS — I739 Peripheral vascular disease, unspecified: Secondary | ICD-10-CM

## 2023-11-28 MED ORDER — APIXABAN 5 MG PO TABS: 5.0000 mg | ORAL_TABLET | Freq: Two times a day (BID) | ORAL | 0 refills | Status: DC

## 2023-11-28 MED ORDER — DOXYCYCLINE HYCLATE 100 MG PO TABS: 100.0000 mg | ORAL_TABLET | Freq: Two times a day (BID) | ORAL | 1 refills | Status: DC

## 2023-11-28 NOTE — Progress Notes (Signed)
Cardiology Office Note:  .   Date:  11/28/2023  ID:  William Webb, [place] 1951/06/21, MRN [id number] PCP: Irven Coe, MD  [facility] Providers Cardiologist:  Truett Mainland, MD PCP: Irven Coe, MD  Chief Complaint  Patient presents with   peripheral artery disease   Follow-up      History of Present Illness: .    Macintyre [facility] is a 73 y.o. 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  He was recently seen by Dr. Lenell Antu and subsequently underwent abdominal and lower extremity angiography, and right TP trunk and peroneal artery revascularization with shockwave lithotripsy.   Patient is here with his brother today. He was seen by podiatrist Dr Charlsie Merles today who was concerned about healing of his Rt heel wound. He denies any pain, has discharge, denies fever, chills.     Vitals:   11/28/23 1548  BP: (!) 147/75  Pulse: 73  Resp: 16  SpO2: 95%     ROS:  Review of Systems  Cardiovascular:  Negative for chest pain, dyspnea on exertion, leg swelling, palpitations and syncope.  Skin:  Positive for poor wound healing.     Studies Reviewed: Marland Kitchen   EKG Interpretation Date/Time:  Thursday November 28 2023 15:47:37 EST Ventricular Rate:  72 PR Interval:  234 QRS Duration:  98 QT Interval:  444 QTC Calculation: 486 R Axis:   -4  Text Interpretation: EKG 11/28/2023: Sinus rhythm with 1st degree A-V block with occasional Premature ventricular complexes Nonspecific T wave abnormality Prolonged QT When compared with ECG of 20-May-2023 21:46, No significant change was found Confirmed by Truett Mainland [id number]) on 11/28/2023 4:04:41 PM    EKG 11/28/2023: Sinus rhythm with 1st degree A-V block with occasional Premature ventricular  complexes Nonspecific T wave abnormality Prolonged QT When compared with ECG of 20-May-2023 21:46, No significant change was found  Independently interpreted 10/31/2023: Chol 95, TG 87, HDL 29, LDL 49  Risk Assessment/Calculations:    CHA2DS2-VASc Score = 4  This indicates a 4.8% annual risk of stroke. The patient's score is based upon: CHF History: 0 HTN History: 1 Diabetes History: 1 Stroke History: 0 Vascular Disease History: 1 Age Score: 1 Gender Score: 0     Physical Exam:   Physical Exam Vitals and nursing note reviewed.  Constitutional:      General: He is not in acute distress. Neck:     Vascular: No JVD.  Cardiovascular:     Rate and Rhythm: Normal rate and regular rhythm.     Heart sounds: Normal heart sounds. No murmur heard. Pulmonary:     Effort: Pulmonary effort is normal.     Breath sounds: Normal breath sounds. No wheezing or rales.  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 PAD (peripheral artery disease) (HCC)  I73.9 EKG 12-Lead    2. Critical limb ischemia of right lower extremity (HCC)  I70.221     3. Mixed hyperlipidemia  E78.2     4. Essential hypertension  I10        ASSESSMENT AND PLAN: .    Kaleb Sek [facility] is a  73 y.o. 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 Chestine Spore.  He still has nonhealing ulcer with foul smelling discharge  He has only one vessel runoff at the ankle level. Concerned that he may progress to Rt BKA. I have reached out to Dr. Ophelia Charter office who have graciously offered him an appt tomorrow morning. Appreciate their help. With ongoing CLI, I have not made any changes to his antihypertensive therapy  today BP mildly elevated, but improving. It appears that he is only on Aspirin, and not P2Y12i. He is also on Eliquis for Afib. I will defer this to Dr. Chestine Spore as he may be seeing him tomorrow.   Aortic stenosis: Moderate. Repeat echocardiogram 05/2024.  PAF: CHA2DS2VASc score 4, annual stroke risk 5%. Continue eliquis 5 mg bid.   Meds ordered this encounter  Medications   apixaban (ELIQUIS) 5 MG TABS tablet    Sig: Take 1 tablet (5 mg total) by mouth 2 (two) times daily.    Dispense:  28 tablet    Refill:  0    Lot Number?:   [id number]    Expiration Date?:   10/29/2024     F/u in 3 months  Signed, Elder Negus, MD

## 2023-11-28 NOTE — Patient Instructions (Addendum)
Medication Instructions:   Your physician recommends that you continue on your current medications as directed. Please refer to the Current Medication list given to you today.  *If you need a refill on your cardiac medications before your next appointment, please call your pharmacy*     Follow-Up:   1.) CHECKOUT--PATIENT NEEDS AN APPOINTMENT WITH DR. CLARK AT [facility] TOMORROW MORNING--HE IS ESTABLISHED--THIS IS URGENT POST-OP FOLLOW-UP--CAN SEE ON MONDAY MORNING IF THEY CANNOT SEE HIM TOMORROW MORNING (PLEASE CALL AND ARRANGE)   2.)  3 MONTHS WITH DR. PATWARDHAN OR AN EXTENDER

## 2023-11-28 NOTE — Progress Notes (Signed)
Subjective:   Patient ID: William Webb, male   DOB: 73 y.o.   MRN: [id number]   HPI Patient states that the caregiver that was ordered by the vascular surgeon has not come for little while my right heel seems worse I did have vascular surgery beginning in January but my foot has turned quite dark.  Presents with caregiver   ROS      Objective:  Physical Exam  Severe circulatory loss right with history of BKA amputation left with patient noted to have significant coolness of the foot and black discoloration in the digits.  The heel shows low-grade breakdown of tissue localized with odor and patient was on antibiotics but has finished.  He has no systemic signs of infection states he feels fine and is not running any fever     Assessment:  Probability that he is dealing with vascular show a tug right lower leg with ulceration very poor health and history of being on dialysis with no current systemic indication of infection or systemic sepsis     Plan:  H&P reviewed with him and caregiver.  I am sending him straight to the vascular doctor today and I did put in an urgent referral and I am very concerned that he may have a shutdown of the vascular procedure done and ultimately may require BKA amputation.  I did clean the wound up I flushed and I applied new dressing to it and hopefully once he is evaluated from a vascular standpoint they may be able to resume localized wound care but if there is no circulatory status that is not can to be effective for him.  It is not a medical emergency as he is not having a lot of pain and he does not have systemic sign of infection.  He is going today to be checked from the vascular doctor and is encouraged to call us of any issues were to occur but understands he is at very high risk for BKA amputation given the circulatory condition

## 2023-11-28 NOTE — Telephone Encounter (Signed)
Caller: Dr. Rosemary Holms  Concern: Non-healing wound, odorous, no palpable pulse, worsening CLI, may need BKA  Location: right leg  Procedure: Interventional Vascular Procedure  Resolution: Appointment scheduled for next available triage appt  Next Appt: Appointment scheduled for 11/29/23 @ 0900, pt to arrive @ 0845

## 2023-11-29 ENCOUNTER — Ambulatory Visit (INDEPENDENT_AMBULATORY_CARE_PROVIDER_SITE_OTHER): Payer: Medicare Other | Admitting: Physician Assistant

## 2023-11-29 VITALS — BP 171/84 | HR 74 | Temp 97.0°F | Resp 20 | Ht 75.0 in | Wt 200.0 lb

## 2023-11-29 DIAGNOSIS — M810 Age-related osteoporosis without current pathological fracture: Secondary | ICD-10-CM | POA: Diagnosis not present

## 2023-11-29 DIAGNOSIS — E1140 Type 2 diabetes mellitus with diabetic neuropathy, unspecified: Secondary | ICD-10-CM | POA: Diagnosis not present

## 2023-11-29 DIAGNOSIS — Z992 Dependence on renal dialysis: Secondary | ICD-10-CM | POA: Diagnosis not present

## 2023-11-29 DIAGNOSIS — Z89511 Acquired absence of right leg below knee: Secondary | ICD-10-CM | POA: Diagnosis not present

## 2023-11-29 DIAGNOSIS — I70234 Atherosclerosis of native arteries of right leg with ulceration of heel and midfoot: Secondary | ICD-10-CM

## 2023-11-29 DIAGNOSIS — E46 Unspecified protein-calorie malnutrition: Secondary | ICD-10-CM | POA: Diagnosis not present

## 2023-11-29 DIAGNOSIS — M159 Polyosteoarthritis, unspecified: Secondary | ICD-10-CM | POA: Diagnosis not present

## 2023-11-29 DIAGNOSIS — I2510 Atherosclerotic heart disease of native coronary artery without angina pectoris: Secondary | ICD-10-CM | POA: Diagnosis not present

## 2023-11-29 DIAGNOSIS — L97412 Non-pressure chronic ulcer of right heel and midfoot with fat layer exposed: Secondary | ICD-10-CM | POA: Diagnosis not present

## 2023-11-29 DIAGNOSIS — I739 Peripheral vascular disease, unspecified: Secondary | ICD-10-CM

## 2023-11-29 DIAGNOSIS — D509 Iron deficiency anemia, unspecified: Secondary | ICD-10-CM | POA: Diagnosis not present

## 2023-11-29 DIAGNOSIS — E11621 Type 2 diabetes mellitus with foot ulcer: Secondary | ICD-10-CM | POA: Diagnosis not present

## 2023-11-29 DIAGNOSIS — E113411 Type 2 diabetes mellitus with severe nonproliferative diabetic retinopathy with macular edema, right eye: Secondary | ICD-10-CM | POA: Diagnosis not present

## 2023-11-29 DIAGNOSIS — I132 Hypertensive heart and chronic kidney disease with heart failure and with stage 5 chronic kidney disease, or end stage renal disease: Secondary | ICD-10-CM | POA: Diagnosis not present

## 2023-11-29 DIAGNOSIS — E1129 Type 2 diabetes mellitus with other diabetic kidney complication: Secondary | ICD-10-CM | POA: Diagnosis not present

## 2023-11-29 DIAGNOSIS — D631 Anemia in chronic kidney disease: Secondary | ICD-10-CM | POA: Diagnosis not present

## 2023-11-29 DIAGNOSIS — I480 Paroxysmal atrial fibrillation: Secondary | ICD-10-CM | POA: Diagnosis not present

## 2023-11-29 DIAGNOSIS — I5032 Chronic diastolic (congestive) heart failure: Secondary | ICD-10-CM | POA: Diagnosis not present

## 2023-11-29 DIAGNOSIS — N186 End stage renal disease: Secondary | ICD-10-CM | POA: Diagnosis not present

## 2023-11-29 DIAGNOSIS — N2581 Secondary hyperparathyroidism of renal origin: Secondary | ICD-10-CM | POA: Diagnosis not present

## 2023-11-29 DIAGNOSIS — E1151 Type 2 diabetes mellitus with diabetic peripheral angiopathy without gangrene: Secondary | ICD-10-CM | POA: Diagnosis not present

## 2023-11-29 DIAGNOSIS — I700 Atherosclerosis of aorta: Secondary | ICD-10-CM | POA: Diagnosis not present

## 2023-11-29 DIAGNOSIS — M797 Fibromyalgia: Secondary | ICD-10-CM | POA: Diagnosis not present

## 2023-11-29 DIAGNOSIS — J302 Other seasonal allergic rhinitis: Secondary | ICD-10-CM | POA: Diagnosis not present

## 2023-11-29 DIAGNOSIS — E1122 Type 2 diabetes mellitus with diabetic chronic kidney disease: Secondary | ICD-10-CM | POA: Diagnosis not present

## 2023-11-29 DIAGNOSIS — H35033 Hypertensive retinopathy, bilateral: Secondary | ICD-10-CM | POA: Diagnosis not present

## 2023-11-29 NOTE — Progress Notes (Signed)
Office Note   History of Present Illness   William Webb is a 73 y.o. (10/29/1951) male who presents as a triage visit.  He has a history of left BKA by Dr. Lajoyce Corners on 06/07/2023.  He recently underwent right lower extremity angiogram with shockwave lithotripsy to the tibioperoneal trunk and peroneal artery on 10/31/2023 by Dr. Chestine Spore.  This was done for critical limb ischemia with a right heel ulcer.  Right lower extremity angiogram demonstrated patent common femoral and profunda arteries.  The SFA and popliteal arteries were diffusely diseased without flow-limiting stenosis.  Distally he had single-vessel peroneal runoff.  Lithotripsy was performed on a high-grade 80% stenosis in the TP trunk and 50% stenosis in the peroneal.  He was considered maximally revascularized after his angiogram.  He returns today as a triage visit.  He was urgently sent back to our office by his cardiologist for a worsening appearance to his right heel wound.  The patient states his right heel wound has worsened in appearance over the past couple of weeks since his angiogram.  He says this is likely because he is not getting adequate wound care at home.  Per patient, multiple providers have tried to set up home health care, however he may only see a HH RN every 1 to 2 weeks.  He says that home health nursing has been the only people to change his bandages.  He feels like his wound has worsened in appearance because his bandage rarely gets changed.  He denies any increased drainage from the right foot wound.  He denies any fevers, chills, tenderness, or erythema.  He denies any pain in the right foot.  Of note, he does not offload his foot when laying in bed.  He currently mobilizes via wheelchair due to recent left BKA.  Current Outpatient Medications  Medication Sig Dispense Refill   acetaminophen (TYLENOL) 325 MG tablet Take 1-2 tablets (325-650 mg total) by mouth every 6 (six) hours as needed for mild pain (pain  score 1-3 or temp > 100.5).     apixaban (ELIQUIS) 5 MG TABS tablet Take 1 tablet (5 mg total) by mouth 2 (two) times daily. 60 tablet 1   apixaban (ELIQUIS) 5 MG TABS tablet Take 1 tablet (5 mg total) by mouth 2 (two) times daily. 28 tablet 0   aspirin EC 81 MG tablet Take 81 mg by mouth daily. Swallow whole.     atorvastatin (LIPITOR) 80 MG tablet Take 80 mg by mouth daily.     carvedilol (COREG) 25 MG tablet Take 1 tablet (25 mg total) by mouth 2 (two) times daily.     doxycycline (VIBRA-TABS) 100 MG tablet Take 1 tablet (100 mg total) by mouth 2 (two) times daily. 60 tablet 1   ethyl chloride spray Apply 1 Application topically 3 (three) times a week. At dialysis     finasteride (PROSCAR) 5 MG tablet Take 5 mg by mouth daily.     gabapentin (NEURONTIN) 600 MG tablet Take 0.5 tablets (300 mg total) by mouth 2 (two) times daily. (Patient taking differently: Take 600 mg by mouth 2 (two) times daily.) 60 tablet 0   insulin aspart (NOVOLOG) 100 UNIT/ML injection Inject 3 Units into the skin daily as needed for high blood sugar.     isosorbide mononitrate (IMDUR) 60 MG 24 hr tablet Take 1 tablet (60 mg total) by mouth daily. 30 tablet 0   lidocaine-prilocaine (EMLA) cream Apply 1 application  topically as needed (  prior to port being accessed).     linaclotide (LINZESS) 145 MCG CAPS capsule Take 145 mcg by mouth as needed (constipation).     loperamide (IMODIUM) 2 MG capsule Take 1 capsule (2 mg total) by mouth every 6 (six) hours as needed for diarrhea or loose stools. 15 capsule 0   loratadine (CLARITIN) 10 MG tablet Take 10 mg by mouth daily as needed for allergies.     omeprazole (PRILOSEC) 20 MG capsule Take 20 mg by mouth daily.     oxyCODONE (OXY IR/ROXICODONE) 5 MG immediate release tablet Take 1-2 tablets (5-10 mg total) by mouth every 4 (four) hours as needed for moderate pain (pain score 4-6). 12 tablet 0   polyethylene glycol powder (GLYCOLAX/MIRALAX) 17 GM/SCOOP powder Take 17 g by mouth  in the morning, at noon, and at bedtime. One scoop in beverage of your choice with each meal. You may increase if you continue to be constipated and decrease if your stool becomes too loose. (Patient taking differently: Take 17 g by mouth daily as needed for moderate constipation.) 255 g 0   sevelamer carbonate (RENVELA) 800 MG tablet Take 2 tablets (1,600 mg total) by mouth 3 (three) times daily with meals.     tamsulosin (FLOMAX) 0.4 MG CAPS capsule Take 0.4 mg by mouth at bedtime.     No current facility-administered medications for this visit.    REVIEW OF SYSTEMS (negative unless checked):   Cardiac:  []  Chest pain or chest pressure? []  Shortness of breath upon activity? []  Shortness of breath when lying flat? []  Irregular heart rhythm?  Vascular:  []  Pain in calf, thigh, or hip brought on by walking? []  Pain in feet at night that wakes you up from your sleep? []  Blood clot in your veins? []  Leg swelling?  Pulmonary:  []  Oxygen at home? []  Productive cough? []  Wheezing?  Neurologic:  []  Sudden weakness in arms or legs? []  Sudden numbness in arms or legs? []  Sudden onset of difficult speaking or slurred speech? []  Temporary loss of vision in one eye? []  Problems with dizziness?  Gastrointestinal:  []  Blood in stool? []  Vomited blood?  Genitourinary:  []  Burning when urinating? []  Blood in urine?  Psychiatric:  []  Major depression  Hematologic:  []  Bleeding problems? []  Problems with blood clotting?  Dermatologic:  []  Rashes or ulcers?  Constitutional:  []  Fever or chills?  Ear/Nose/Throat:  []  Change in hearing? []  Nose bleeds? []  Sore throat?  Musculoskeletal:  []  Back pain? []  Joint pain? []  Muscle pain?   Physical Examination   Vitals:   11/29/23 0851  BP: (!) 171/84  Pulse: 74  Resp: 20  Temp: (!) 97 F (36.1 C)  TempSrc: Temporal  SpO2: 92%  Weight: 200 lb (90.7 kg)  Height: 6\' 3"  (1.905 m)   Body mass index is 25  kg/m.  General: No acute distress, in wheelchair Gait: Not observed HENT: WNL, normocephalic Pulmonary: normal non-labored breathing , without rales, rhonchi,  wheezing Cardiac: regular Abdomen: soft, NT, no masses Skin: without rashes Vascular Exam/Pulses: Monophasic DP/PT/peroneal Doppler signals Extremities: Right heel wound is dry, no overlying black eschar.  No tenderness or erythema Musculoskeletal: no muscle wasting or atrophy  Neurologic: A&O X 3;  No focal weakness or paresthesias are detected Psychiatric:  The pt has Normal affect.     Medical Decision Making   William Webb is a 73 y.o. male who presents as a triage visit  The patient recently underwent  right lower extremity angiogram with shockwave lithotripsy to the right tibioperoneal trunk and peroneal artery on 10/31/2023.  This was done for a right heel wound The patient was sent back to our office sooner than his scheduled follow-up for worsening appearance of his wound.  He is aware that he may ultimately require a right BKA He states his right heel wound has gotten worse in appearance over the past couple weeks since he has not been getting regular wound care. His bandage is changed at most once a week by home health nursing. He says in the past when he had more regular bandage changes his wound was improving.  He denies any increased drainage from the wound.  He denies any signs of infection such as fevers, chills, erythema, or tenderness After the patient's recent angiogram, his right lower extremity was considered vascularly optimized.  On exam he has monophasic right DP/PT/peroneal Doppler signals.  I have discussed with the patient that he has 2 options at this time given that his wound does not appear infected: We can continue local wound care and arrange a close follow-up or offer him a below-knee amputation.  The patient does not want a BKA at this time.  He would like to watch and wait to see if his wound  improves with regular wound care. I will send out another referral to home health nursing to ensure that he gets regular bandage changes at least 2-3 times a week.  I have encouraged the patient to find a family member or friend that is willing to change the bandage on the other days.  He needs to keep his wound clean and dry.  I have instructed him to go to the ED if his wound starts having increased drainage or if he starts having tenderness, fever, or chills. He can keep his follow-up with our office in a couple of weeks for repeat wound check with studies   Loel Dubonnet PA-C [facility] Office: [phone]  Call MD: Randie Heinz

## 2023-12-02 DIAGNOSIS — I2510 Atherosclerotic heart disease of native coronary artery without angina pectoris: Secondary | ICD-10-CM | POA: Diagnosis not present

## 2023-12-02 DIAGNOSIS — E1122 Type 2 diabetes mellitus with diabetic chronic kidney disease: Secondary | ICD-10-CM | POA: Diagnosis not present

## 2023-12-02 DIAGNOSIS — L97412 Non-pressure chronic ulcer of right heel and midfoot with fat layer exposed: Secondary | ICD-10-CM | POA: Diagnosis not present

## 2023-12-02 DIAGNOSIS — I480 Paroxysmal atrial fibrillation: Secondary | ICD-10-CM | POA: Diagnosis not present

## 2023-12-02 DIAGNOSIS — Z89511 Acquired absence of right leg below knee: Secondary | ICD-10-CM | POA: Diagnosis not present

## 2023-12-02 DIAGNOSIS — E1129 Type 2 diabetes mellitus with other diabetic kidney complication: Secondary | ICD-10-CM | POA: Diagnosis not present

## 2023-12-02 DIAGNOSIS — E1151 Type 2 diabetes mellitus with diabetic peripheral angiopathy without gangrene: Secondary | ICD-10-CM | POA: Diagnosis not present

## 2023-12-02 DIAGNOSIS — J302 Other seasonal allergic rhinitis: Secondary | ICD-10-CM | POA: Diagnosis not present

## 2023-12-02 DIAGNOSIS — H35033 Hypertensive retinopathy, bilateral: Secondary | ICD-10-CM | POA: Diagnosis not present

## 2023-12-02 DIAGNOSIS — E46 Unspecified protein-calorie malnutrition: Secondary | ICD-10-CM | POA: Diagnosis not present

## 2023-12-02 DIAGNOSIS — I700 Atherosclerosis of aorta: Secondary | ICD-10-CM | POA: Diagnosis not present

## 2023-12-02 DIAGNOSIS — I5032 Chronic diastolic (congestive) heart failure: Secondary | ICD-10-CM | POA: Diagnosis not present

## 2023-12-02 DIAGNOSIS — R52 Pain, unspecified: Secondary | ICD-10-CM | POA: Diagnosis not present

## 2023-12-02 DIAGNOSIS — N2581 Secondary hyperparathyroidism of renal origin: Secondary | ICD-10-CM | POA: Diagnosis not present

## 2023-12-02 DIAGNOSIS — E1140 Type 2 diabetes mellitus with diabetic neuropathy, unspecified: Secondary | ICD-10-CM | POA: Diagnosis not present

## 2023-12-02 DIAGNOSIS — E113411 Type 2 diabetes mellitus with severe nonproliferative diabetic retinopathy with macular edema, right eye: Secondary | ICD-10-CM | POA: Diagnosis not present

## 2023-12-02 DIAGNOSIS — R197 Diarrhea, unspecified: Secondary | ICD-10-CM | POA: Diagnosis not present

## 2023-12-02 DIAGNOSIS — M810 Age-related osteoporosis without current pathological fracture: Secondary | ICD-10-CM | POA: Diagnosis not present

## 2023-12-02 DIAGNOSIS — M797 Fibromyalgia: Secondary | ICD-10-CM | POA: Diagnosis not present

## 2023-12-02 DIAGNOSIS — M159 Polyosteoarthritis, unspecified: Secondary | ICD-10-CM | POA: Diagnosis not present

## 2023-12-02 DIAGNOSIS — Z992 Dependence on renal dialysis: Secondary | ICD-10-CM | POA: Diagnosis not present

## 2023-12-02 DIAGNOSIS — N186 End stage renal disease: Secondary | ICD-10-CM | POA: Diagnosis not present

## 2023-12-02 DIAGNOSIS — I132 Hypertensive heart and chronic kidney disease with heart failure and with stage 5 chronic kidney disease, or end stage renal disease: Secondary | ICD-10-CM | POA: Diagnosis not present

## 2023-12-02 DIAGNOSIS — D631 Anemia in chronic kidney disease: Secondary | ICD-10-CM | POA: Diagnosis not present

## 2023-12-02 DIAGNOSIS — D509 Iron deficiency anemia, unspecified: Secondary | ICD-10-CM | POA: Diagnosis not present

## 2023-12-02 DIAGNOSIS — E11621 Type 2 diabetes mellitus with foot ulcer: Secondary | ICD-10-CM | POA: Diagnosis not present

## 2023-12-03 ENCOUNTER — Other Ambulatory Visit: Payer: Self-pay | Admitting: *Deleted

## 2023-12-03 DIAGNOSIS — I739 Peripheral vascular disease, unspecified: Secondary | ICD-10-CM

## 2023-12-03 DIAGNOSIS — I70234 Atherosclerosis of native arteries of right leg with ulceration of heel and midfoot: Secondary | ICD-10-CM

## 2023-12-04 DIAGNOSIS — E1129 Type 2 diabetes mellitus with other diabetic kidney complication: Secondary | ICD-10-CM | POA: Diagnosis not present

## 2023-12-04 DIAGNOSIS — N2581 Secondary hyperparathyroidism of renal origin: Secondary | ICD-10-CM | POA: Diagnosis not present

## 2023-12-04 DIAGNOSIS — D631 Anemia in chronic kidney disease: Secondary | ICD-10-CM | POA: Diagnosis not present

## 2023-12-04 DIAGNOSIS — R197 Diarrhea, unspecified: Secondary | ICD-10-CM | POA: Diagnosis not present

## 2023-12-04 DIAGNOSIS — Z992 Dependence on renal dialysis: Secondary | ICD-10-CM | POA: Diagnosis not present

## 2023-12-04 DIAGNOSIS — D509 Iron deficiency anemia, unspecified: Secondary | ICD-10-CM | POA: Diagnosis not present

## 2023-12-04 DIAGNOSIS — N186 End stage renal disease: Secondary | ICD-10-CM | POA: Diagnosis not present

## 2023-12-04 DIAGNOSIS — R52 Pain, unspecified: Secondary | ICD-10-CM | POA: Diagnosis not present

## 2023-12-06 DIAGNOSIS — E1129 Type 2 diabetes mellitus with other diabetic kidney complication: Secondary | ICD-10-CM | POA: Diagnosis not present

## 2023-12-06 DIAGNOSIS — Z992 Dependence on renal dialysis: Secondary | ICD-10-CM | POA: Diagnosis not present

## 2023-12-06 DIAGNOSIS — R52 Pain, unspecified: Secondary | ICD-10-CM | POA: Diagnosis not present

## 2023-12-06 DIAGNOSIS — N186 End stage renal disease: Secondary | ICD-10-CM | POA: Diagnosis not present

## 2023-12-06 DIAGNOSIS — R197 Diarrhea, unspecified: Secondary | ICD-10-CM | POA: Diagnosis not present

## 2023-12-06 DIAGNOSIS — N2581 Secondary hyperparathyroidism of renal origin: Secondary | ICD-10-CM | POA: Diagnosis not present

## 2023-12-06 DIAGNOSIS — D509 Iron deficiency anemia, unspecified: Secondary | ICD-10-CM | POA: Diagnosis not present

## 2023-12-06 DIAGNOSIS — D631 Anemia in chronic kidney disease: Secondary | ICD-10-CM | POA: Diagnosis not present

## 2023-12-09 DIAGNOSIS — R197 Diarrhea, unspecified: Secondary | ICD-10-CM | POA: Diagnosis not present

## 2023-12-09 DIAGNOSIS — Z992 Dependence on renal dialysis: Secondary | ICD-10-CM | POA: Diagnosis not present

## 2023-12-09 DIAGNOSIS — N2581 Secondary hyperparathyroidism of renal origin: Secondary | ICD-10-CM | POA: Diagnosis not present

## 2023-12-09 DIAGNOSIS — J302 Other seasonal allergic rhinitis: Secondary | ICD-10-CM | POA: Diagnosis not present

## 2023-12-09 DIAGNOSIS — D631 Anemia in chronic kidney disease: Secondary | ICD-10-CM | POA: Diagnosis not present

## 2023-12-09 DIAGNOSIS — I480 Paroxysmal atrial fibrillation: Secondary | ICD-10-CM | POA: Diagnosis not present

## 2023-12-09 DIAGNOSIS — I132 Hypertensive heart and chronic kidney disease with heart failure and with stage 5 chronic kidney disease, or end stage renal disease: Secondary | ICD-10-CM | POA: Diagnosis not present

## 2023-12-09 DIAGNOSIS — I2510 Atherosclerotic heart disease of native coronary artery without angina pectoris: Secondary | ICD-10-CM | POA: Diagnosis not present

## 2023-12-09 DIAGNOSIS — M797 Fibromyalgia: Secondary | ICD-10-CM | POA: Diagnosis not present

## 2023-12-09 DIAGNOSIS — E113411 Type 2 diabetes mellitus with severe nonproliferative diabetic retinopathy with macular edema, right eye: Secondary | ICD-10-CM | POA: Diagnosis not present

## 2023-12-09 DIAGNOSIS — I700 Atherosclerosis of aorta: Secondary | ICD-10-CM | POA: Diagnosis not present

## 2023-12-09 DIAGNOSIS — M810 Age-related osteoporosis without current pathological fracture: Secondary | ICD-10-CM | POA: Diagnosis not present

## 2023-12-09 DIAGNOSIS — D509 Iron deficiency anemia, unspecified: Secondary | ICD-10-CM | POA: Diagnosis not present

## 2023-12-09 DIAGNOSIS — E11621 Type 2 diabetes mellitus with foot ulcer: Secondary | ICD-10-CM | POA: Diagnosis not present

## 2023-12-09 DIAGNOSIS — E1140 Type 2 diabetes mellitus with diabetic neuropathy, unspecified: Secondary | ICD-10-CM | POA: Diagnosis not present

## 2023-12-09 DIAGNOSIS — E1151 Type 2 diabetes mellitus with diabetic peripheral angiopathy without gangrene: Secondary | ICD-10-CM | POA: Diagnosis not present

## 2023-12-09 DIAGNOSIS — L97412 Non-pressure chronic ulcer of right heel and midfoot with fat layer exposed: Secondary | ICD-10-CM | POA: Diagnosis not present

## 2023-12-09 DIAGNOSIS — H35033 Hypertensive retinopathy, bilateral: Secondary | ICD-10-CM | POA: Diagnosis not present

## 2023-12-09 DIAGNOSIS — Z89511 Acquired absence of right leg below knee: Secondary | ICD-10-CM | POA: Diagnosis not present

## 2023-12-09 DIAGNOSIS — N186 End stage renal disease: Secondary | ICD-10-CM | POA: Diagnosis not present

## 2023-12-09 DIAGNOSIS — E1129 Type 2 diabetes mellitus with other diabetic kidney complication: Secondary | ICD-10-CM | POA: Diagnosis not present

## 2023-12-09 DIAGNOSIS — M159 Polyosteoarthritis, unspecified: Secondary | ICD-10-CM | POA: Diagnosis not present

## 2023-12-09 DIAGNOSIS — E1122 Type 2 diabetes mellitus with diabetic chronic kidney disease: Secondary | ICD-10-CM | POA: Diagnosis not present

## 2023-12-09 DIAGNOSIS — E46 Unspecified protein-calorie malnutrition: Secondary | ICD-10-CM | POA: Diagnosis not present

## 2023-12-09 DIAGNOSIS — R52 Pain, unspecified: Secondary | ICD-10-CM | POA: Diagnosis not present

## 2023-12-09 DIAGNOSIS — I5032 Chronic diastolic (congestive) heart failure: Secondary | ICD-10-CM | POA: Diagnosis not present

## 2023-12-11 DIAGNOSIS — D509 Iron deficiency anemia, unspecified: Secondary | ICD-10-CM | POA: Diagnosis not present

## 2023-12-11 DIAGNOSIS — R52 Pain, unspecified: Secondary | ICD-10-CM | POA: Diagnosis not present

## 2023-12-11 DIAGNOSIS — N186 End stage renal disease: Secondary | ICD-10-CM | POA: Diagnosis not present

## 2023-12-11 DIAGNOSIS — N2581 Secondary hyperparathyroidism of renal origin: Secondary | ICD-10-CM | POA: Diagnosis not present

## 2023-12-11 DIAGNOSIS — R197 Diarrhea, unspecified: Secondary | ICD-10-CM | POA: Diagnosis not present

## 2023-12-11 DIAGNOSIS — D631 Anemia in chronic kidney disease: Secondary | ICD-10-CM | POA: Diagnosis not present

## 2023-12-11 DIAGNOSIS — E1129 Type 2 diabetes mellitus with other diabetic kidney complication: Secondary | ICD-10-CM | POA: Diagnosis not present

## 2023-12-11 DIAGNOSIS — Z992 Dependence on renal dialysis: Secondary | ICD-10-CM | POA: Diagnosis not present

## 2023-12-13 DIAGNOSIS — N2581 Secondary hyperparathyroidism of renal origin: Secondary | ICD-10-CM | POA: Diagnosis not present

## 2023-12-13 DIAGNOSIS — N186 End stage renal disease: Secondary | ICD-10-CM | POA: Diagnosis not present

## 2023-12-13 DIAGNOSIS — D631 Anemia in chronic kidney disease: Secondary | ICD-10-CM | POA: Diagnosis not present

## 2023-12-13 DIAGNOSIS — R197 Diarrhea, unspecified: Secondary | ICD-10-CM | POA: Diagnosis not present

## 2023-12-13 DIAGNOSIS — E113411 Type 2 diabetes mellitus with severe nonproliferative diabetic retinopathy with macular edema, right eye: Secondary | ICD-10-CM | POA: Diagnosis not present

## 2023-12-13 DIAGNOSIS — I70223 Atherosclerosis of native arteries of extremities with rest pain, bilateral legs: Secondary | ICD-10-CM | POA: Diagnosis not present

## 2023-12-13 DIAGNOSIS — E1129 Type 2 diabetes mellitus with other diabetic kidney complication: Secondary | ICD-10-CM | POA: Diagnosis not present

## 2023-12-13 DIAGNOSIS — R52 Pain, unspecified: Secondary | ICD-10-CM | POA: Diagnosis not present

## 2023-12-13 DIAGNOSIS — D509 Iron deficiency anemia, unspecified: Secondary | ICD-10-CM | POA: Diagnosis not present

## 2023-12-13 DIAGNOSIS — I480 Paroxysmal atrial fibrillation: Secondary | ICD-10-CM | POA: Diagnosis not present

## 2023-12-13 DIAGNOSIS — Z992 Dependence on renal dialysis: Secondary | ICD-10-CM | POA: Diagnosis not present

## 2023-12-13 DIAGNOSIS — E1122 Type 2 diabetes mellitus with diabetic chronic kidney disease: Secondary | ICD-10-CM | POA: Diagnosis not present

## 2023-12-13 DIAGNOSIS — E1151 Type 2 diabetes mellitus with diabetic peripheral angiopathy without gangrene: Secondary | ICD-10-CM | POA: Diagnosis not present

## 2023-12-13 DIAGNOSIS — E11621 Type 2 diabetes mellitus with foot ulcer: Secondary | ICD-10-CM | POA: Diagnosis not present

## 2023-12-13 DIAGNOSIS — E1140 Type 2 diabetes mellitus with diabetic neuropathy, unspecified: Secondary | ICD-10-CM | POA: Diagnosis not present

## 2023-12-13 DIAGNOSIS — L97412 Non-pressure chronic ulcer of right heel and midfoot with fat layer exposed: Secondary | ICD-10-CM | POA: Diagnosis not present

## 2023-12-13 DIAGNOSIS — I132 Hypertensive heart and chronic kidney disease with heart failure and with stage 5 chronic kidney disease, or end stage renal disease: Secondary | ICD-10-CM | POA: Diagnosis not present

## 2023-12-13 DIAGNOSIS — E46 Unspecified protein-calorie malnutrition: Secondary | ICD-10-CM | POA: Diagnosis not present

## 2023-12-13 DIAGNOSIS — I5032 Chronic diastolic (congestive) heart failure: Secondary | ICD-10-CM | POA: Diagnosis not present

## 2023-12-16 DIAGNOSIS — N2581 Secondary hyperparathyroidism of renal origin: Secondary | ICD-10-CM | POA: Diagnosis not present

## 2023-12-16 DIAGNOSIS — R52 Pain, unspecified: Secondary | ICD-10-CM | POA: Diagnosis not present

## 2023-12-16 DIAGNOSIS — D509 Iron deficiency anemia, unspecified: Secondary | ICD-10-CM | POA: Diagnosis not present

## 2023-12-16 DIAGNOSIS — R197 Diarrhea, unspecified: Secondary | ICD-10-CM | POA: Diagnosis not present

## 2023-12-16 DIAGNOSIS — Z992 Dependence on renal dialysis: Secondary | ICD-10-CM | POA: Diagnosis not present

## 2023-12-16 DIAGNOSIS — N186 End stage renal disease: Secondary | ICD-10-CM | POA: Diagnosis not present

## 2023-12-16 DIAGNOSIS — D631 Anemia in chronic kidney disease: Secondary | ICD-10-CM | POA: Diagnosis not present

## 2023-12-16 DIAGNOSIS — E1129 Type 2 diabetes mellitus with other diabetic kidney complication: Secondary | ICD-10-CM | POA: Diagnosis not present
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
## 2023-12-17 ENCOUNTER — Ambulatory Visit: Payer: Medicare Other | Admitting: Orthopedic Surgery

## 2023-12-17 ENCOUNTER — Ambulatory Visit (HOSPITAL_COMMUNITY): Admit: 2023-12-17 | Payer: Medicare Other

## 2023-12-17 ENCOUNTER — Ambulatory Visit (INDEPENDENT_AMBULATORY_CARE_PROVIDER_SITE_OTHER): Payer: Medicare Other | Admitting: Physician Assistant

## 2023-12-17 VITALS — BP 185/92 | HR 75 | Temp 97.6°F | Resp 18 | Ht 75.0 in | Wt 191.8 lb

## 2023-12-17 DIAGNOSIS — E1122 Type 2 diabetes mellitus with diabetic chronic kidney disease: Secondary | ICD-10-CM | POA: Diagnosis not present

## 2023-12-17 DIAGNOSIS — M810 Age-related osteoporosis without current pathological fracture: Secondary | ICD-10-CM | POA: Diagnosis not present

## 2023-12-17 DIAGNOSIS — H409 Unspecified glaucoma: Secondary | ICD-10-CM | POA: Diagnosis not present

## 2023-12-17 DIAGNOSIS — E11621 Type 2 diabetes mellitus with foot ulcer: Secondary | ICD-10-CM | POA: Diagnosis not present

## 2023-12-17 DIAGNOSIS — E1151 Type 2 diabetes mellitus with diabetic peripheral angiopathy without gangrene: Secondary | ICD-10-CM | POA: Diagnosis not present

## 2023-12-17 DIAGNOSIS — E7800 Pure hypercholesterolemia, unspecified: Secondary | ICD-10-CM | POA: Diagnosis not present

## 2023-12-17 DIAGNOSIS — I739 Peripheral vascular disease, unspecified: Secondary | ICD-10-CM | POA: Diagnosis not present

## 2023-12-17 DIAGNOSIS — I700 Atherosclerosis of aorta: Secondary | ICD-10-CM | POA: Diagnosis not present

## 2023-12-17 DIAGNOSIS — I70223 Atherosclerosis of native arteries of extremities with rest pain, bilateral legs: Secondary | ICD-10-CM | POA: Diagnosis not present

## 2023-12-17 DIAGNOSIS — I5032 Chronic diastolic (congestive) heart failure: Secondary | ICD-10-CM | POA: Diagnosis not present

## 2023-12-17 DIAGNOSIS — D631 Anemia in chronic kidney disease: Secondary | ICD-10-CM | POA: Diagnosis not present

## 2023-12-17 DIAGNOSIS — J302 Other seasonal allergic rhinitis: Secondary | ICD-10-CM | POA: Diagnosis not present

## 2023-12-17 DIAGNOSIS — I132 Hypertensive heart and chronic kidney disease with heart failure and with stage 5 chronic kidney disease, or end stage renal disease: Secondary | ICD-10-CM | POA: Diagnosis not present

## 2023-12-17 DIAGNOSIS — M797 Fibromyalgia: Secondary | ICD-10-CM | POA: Diagnosis not present

## 2023-12-17 DIAGNOSIS — I252 Old myocardial infarction: Secondary | ICD-10-CM | POA: Diagnosis not present

## 2023-12-17 DIAGNOSIS — Z89512 Acquired absence of left leg below knee: Secondary | ICD-10-CM | POA: Diagnosis not present

## 2023-12-17 DIAGNOSIS — H35033 Hypertensive retinopathy, bilateral: Secondary | ICD-10-CM | POA: Diagnosis not present

## 2023-12-17 DIAGNOSIS — L97412 Non-pressure chronic ulcer of right heel and midfoot with fat layer exposed: Secondary | ICD-10-CM | POA: Diagnosis not present

## 2023-12-17 DIAGNOSIS — E46 Unspecified protein-calorie malnutrition: Secondary | ICD-10-CM | POA: Diagnosis not present

## 2023-12-17 DIAGNOSIS — E113411 Type 2 diabetes mellitus with severe nonproliferative diabetic retinopathy with macular edema, right eye: Secondary | ICD-10-CM | POA: Diagnosis not present

## 2023-12-17 DIAGNOSIS — L89611 Pressure ulcer of right heel, stage 1: Secondary | ICD-10-CM

## 2023-12-17 DIAGNOSIS — E1140 Type 2 diabetes mellitus with diabetic neuropathy, unspecified: Secondary | ICD-10-CM | POA: Diagnosis not present

## 2023-12-17 DIAGNOSIS — I480 Paroxysmal atrial fibrillation: Secondary | ICD-10-CM | POA: Diagnosis not present

## 2023-12-17 DIAGNOSIS — I2510 Atherosclerotic heart disease of native coronary artery without angina pectoris: Secondary | ICD-10-CM | POA: Diagnosis not present

## 2023-12-17 DIAGNOSIS — M159 Polyosteoarthritis, unspecified: Secondary | ICD-10-CM | POA: Diagnosis not present
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
## 2023-12-18 ENCOUNTER — Encounter: Payer: Self-pay | Admitting: Orthopedic Surgery

## 2023-12-18 DIAGNOSIS — D509 Iron deficiency anemia, unspecified: Secondary | ICD-10-CM | POA: Diagnosis not present

## 2023-12-18 DIAGNOSIS — R197 Diarrhea, unspecified: Secondary | ICD-10-CM | POA: Diagnosis not present

## 2023-12-18 DIAGNOSIS — E1129 Type 2 diabetes mellitus with other diabetic kidney complication: Secondary | ICD-10-CM | POA: Diagnosis not present

## 2023-12-18 DIAGNOSIS — R52 Pain, unspecified: Secondary | ICD-10-CM | POA: Diagnosis not present

## 2023-12-18 DIAGNOSIS — N2581 Secondary hyperparathyroidism of renal origin: Secondary | ICD-10-CM | POA: Diagnosis not present

## 2023-12-18 DIAGNOSIS — Z992 Dependence on renal dialysis: Secondary | ICD-10-CM | POA: Diagnosis not present

## 2023-12-18 DIAGNOSIS — D631 Anemia in chronic kidney disease: Secondary | ICD-10-CM | POA: Diagnosis not present

## 2023-12-18 DIAGNOSIS — N186 End stage renal disease: Secondary | ICD-10-CM | POA: Diagnosis not present
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
## 2023-12-19 DIAGNOSIS — I252 Old myocardial infarction: Secondary | ICD-10-CM | POA: Diagnosis not present

## 2023-12-19 DIAGNOSIS — E1151 Type 2 diabetes mellitus with diabetic peripheral angiopathy without gangrene: Secondary | ICD-10-CM | POA: Diagnosis not present

## 2023-12-19 DIAGNOSIS — D631 Anemia in chronic kidney disease: Secondary | ICD-10-CM | POA: Diagnosis not present

## 2023-12-19 DIAGNOSIS — L97412 Non-pressure chronic ulcer of right heel and midfoot with fat layer exposed: Secondary | ICD-10-CM | POA: Diagnosis not present

## 2023-12-19 DIAGNOSIS — E1122 Type 2 diabetes mellitus with diabetic chronic kidney disease: Secondary | ICD-10-CM | POA: Diagnosis not present

## 2023-12-19 DIAGNOSIS — E11621 Type 2 diabetes mellitus with foot ulcer: Secondary | ICD-10-CM | POA: Diagnosis not present

## 2023-12-19 DIAGNOSIS — I5032 Chronic diastolic (congestive) heart failure: Secondary | ICD-10-CM | POA: Diagnosis not present

## 2023-12-19 DIAGNOSIS — I132 Hypertensive heart and chronic kidney disease with heart failure and with stage 5 chronic kidney disease, or end stage renal disease: Secondary | ICD-10-CM | POA: Diagnosis not present

## 2023-12-19 DIAGNOSIS — J302 Other seasonal allergic rhinitis: Secondary | ICD-10-CM | POA: Diagnosis not present

## 2023-12-19 DIAGNOSIS — E46 Unspecified protein-calorie malnutrition: Secondary | ICD-10-CM | POA: Diagnosis not present

## 2023-12-19 DIAGNOSIS — E113411 Type 2 diabetes mellitus with severe nonproliferative diabetic retinopathy with macular edema, right eye: Secondary | ICD-10-CM | POA: Diagnosis not present

## 2023-12-19 DIAGNOSIS — I700 Atherosclerosis of aorta: Secondary | ICD-10-CM | POA: Diagnosis not present

## 2023-12-19 DIAGNOSIS — M159 Polyosteoarthritis, unspecified: Secondary | ICD-10-CM | POA: Diagnosis not present

## 2023-12-19 DIAGNOSIS — M797 Fibromyalgia: Secondary | ICD-10-CM | POA: Diagnosis not present

## 2023-12-19 DIAGNOSIS — I70223 Atherosclerosis of native arteries of extremities with rest pain, bilateral legs: Secondary | ICD-10-CM | POA: Diagnosis not present

## 2023-12-19 DIAGNOSIS — E1140 Type 2 diabetes mellitus with diabetic neuropathy, unspecified: Secondary | ICD-10-CM | POA: Diagnosis not present

## 2023-12-19 DIAGNOSIS — H409 Unspecified glaucoma: Secondary | ICD-10-CM | POA: Diagnosis not present

## 2023-12-19 DIAGNOSIS — H35033 Hypertensive retinopathy, bilateral: Secondary | ICD-10-CM | POA: Diagnosis not present

## 2023-12-19 DIAGNOSIS — E7800 Pure hypercholesterolemia, unspecified: Secondary | ICD-10-CM | POA: Diagnosis not present

## 2023-12-19 DIAGNOSIS — M810 Age-related osteoporosis without current pathological fracture: Secondary | ICD-10-CM | POA: Diagnosis not present

## 2023-12-19 DIAGNOSIS — I2510 Atherosclerotic heart disease of native coronary artery without angina pectoris: Secondary | ICD-10-CM | POA: Diagnosis not present

## 2023-12-19 DIAGNOSIS — I480 Paroxysmal atrial fibrillation: Secondary | ICD-10-CM | POA: Diagnosis not present

## 2023-12-20 DIAGNOSIS — N186 End stage renal disease: Secondary | ICD-10-CM | POA: Diagnosis not present

## 2023-12-20 DIAGNOSIS — R197 Diarrhea, unspecified: Secondary | ICD-10-CM | POA: Diagnosis not present

## 2023-12-20 DIAGNOSIS — E1129 Type 2 diabetes mellitus with other diabetic kidney complication: Secondary | ICD-10-CM | POA: Diagnosis not present

## 2023-12-20 DIAGNOSIS — D631 Anemia in chronic kidney disease: Secondary | ICD-10-CM | POA: Diagnosis not present

## 2023-12-20 DIAGNOSIS — R52 Pain, unspecified: Secondary | ICD-10-CM | POA: Diagnosis not present

## 2023-12-20 DIAGNOSIS — D509 Iron deficiency anemia, unspecified: Secondary | ICD-10-CM | POA: Diagnosis not present

## 2023-12-20 DIAGNOSIS — Z992 Dependence on renal dialysis: Secondary | ICD-10-CM | POA: Diagnosis not present

## 2023-12-20 DIAGNOSIS — N2581 Secondary hyperparathyroidism of renal origin: Secondary | ICD-10-CM | POA: Diagnosis not present

## 2023-12-21 DIAGNOSIS — J302 Other seasonal allergic rhinitis: Secondary | ICD-10-CM | POA: Diagnosis not present

## 2023-12-21 DIAGNOSIS — I700 Atherosclerosis of aorta: Secondary | ICD-10-CM | POA: Diagnosis not present

## 2023-12-21 DIAGNOSIS — D631 Anemia in chronic kidney disease: Secondary | ICD-10-CM | POA: Diagnosis not present

## 2023-12-21 DIAGNOSIS — E7800 Pure hypercholesterolemia, unspecified: Secondary | ICD-10-CM | POA: Diagnosis not present

## 2023-12-21 DIAGNOSIS — M159 Polyosteoarthritis, unspecified: Secondary | ICD-10-CM | POA: Diagnosis not present

## 2023-12-21 DIAGNOSIS — I5032 Chronic diastolic (congestive) heart failure: Secondary | ICD-10-CM | POA: Diagnosis not present

## 2023-12-21 DIAGNOSIS — L97412 Non-pressure chronic ulcer of right heel and midfoot with fat layer exposed: Secondary | ICD-10-CM | POA: Diagnosis not present

## 2023-12-21 DIAGNOSIS — I70223 Atherosclerosis of native arteries of extremities with rest pain, bilateral legs: Secondary | ICD-10-CM | POA: Diagnosis not present

## 2023-12-21 DIAGNOSIS — E11621 Type 2 diabetes mellitus with foot ulcer: Secondary | ICD-10-CM | POA: Diagnosis not present

## 2023-12-21 DIAGNOSIS — M797 Fibromyalgia: Secondary | ICD-10-CM | POA: Diagnosis not present

## 2023-12-21 DIAGNOSIS — H409 Unspecified glaucoma: Secondary | ICD-10-CM | POA: Diagnosis not present

## 2023-12-21 DIAGNOSIS — I132 Hypertensive heart and chronic kidney disease with heart failure and with stage 5 chronic kidney disease, or end stage renal disease: Secondary | ICD-10-CM | POA: Diagnosis not present

## 2023-12-21 DIAGNOSIS — I480 Paroxysmal atrial fibrillation: Secondary | ICD-10-CM | POA: Diagnosis not present

## 2023-12-21 DIAGNOSIS — E46 Unspecified protein-calorie malnutrition: Secondary | ICD-10-CM | POA: Diagnosis not present

## 2023-12-21 DIAGNOSIS — E1140 Type 2 diabetes mellitus with diabetic neuropathy, unspecified: Secondary | ICD-10-CM | POA: Diagnosis not present

## 2023-12-21 DIAGNOSIS — E113411 Type 2 diabetes mellitus with severe nonproliferative diabetic retinopathy with macular edema, right eye: Secondary | ICD-10-CM | POA: Diagnosis not present

## 2023-12-21 DIAGNOSIS — I2510 Atherosclerotic heart disease of native coronary artery without angina pectoris: Secondary | ICD-10-CM | POA: Diagnosis not present

## 2023-12-21 DIAGNOSIS — E1122 Type 2 diabetes mellitus with diabetic chronic kidney disease: Secondary | ICD-10-CM | POA: Diagnosis not present

## 2023-12-21 DIAGNOSIS — M810 Age-related osteoporosis without current pathological fracture: Secondary | ICD-10-CM | POA: Diagnosis not present

## 2023-12-21 DIAGNOSIS — I252 Old myocardial infarction: Secondary | ICD-10-CM | POA: Diagnosis not present

## 2023-12-21 DIAGNOSIS — H35033 Hypertensive retinopathy, bilateral: Secondary | ICD-10-CM | POA: Diagnosis not present

## 2023-12-21 DIAGNOSIS — E1151 Type 2 diabetes mellitus with diabetic peripheral angiopathy without gangrene: Secondary | ICD-10-CM | POA: Diagnosis not present

## 2023-12-23 DIAGNOSIS — E1129 Type 2 diabetes mellitus with other diabetic kidney complication: Secondary | ICD-10-CM | POA: Diagnosis not present

## 2023-12-23 DIAGNOSIS — Z992 Dependence on renal dialysis: Secondary | ICD-10-CM | POA: Diagnosis not present

## 2023-12-23 DIAGNOSIS — R52 Pain, unspecified: Secondary | ICD-10-CM | POA: Diagnosis not present

## 2023-12-23 DIAGNOSIS — N186 End stage renal disease: Secondary | ICD-10-CM | POA: Diagnosis not present

## 2023-12-23 DIAGNOSIS — D631 Anemia in chronic kidney disease: Secondary | ICD-10-CM | POA: Diagnosis not present

## 2023-12-23 DIAGNOSIS — D509 Iron deficiency anemia, unspecified: Secondary | ICD-10-CM | POA: Diagnosis not present

## 2023-12-23 DIAGNOSIS — R197 Diarrhea, unspecified: Secondary | ICD-10-CM | POA: Diagnosis not present

## 2023-12-23 DIAGNOSIS — N2581 Secondary hyperparathyroidism of renal origin: Secondary | ICD-10-CM | POA: Diagnosis not present

## 2023-12-25 DIAGNOSIS — E1122 Type 2 diabetes mellitus with diabetic chronic kidney disease: Secondary | ICD-10-CM | POA: Diagnosis not present

## 2023-12-25 DIAGNOSIS — I5032 Chronic diastolic (congestive) heart failure: Secondary | ICD-10-CM | POA: Diagnosis not present

## 2023-12-25 DIAGNOSIS — E46 Unspecified protein-calorie malnutrition: Secondary | ICD-10-CM | POA: Diagnosis not present

## 2023-12-25 DIAGNOSIS — I480 Paroxysmal atrial fibrillation: Secondary | ICD-10-CM | POA: Diagnosis not present

## 2023-12-25 DIAGNOSIS — M797 Fibromyalgia: Secondary | ICD-10-CM | POA: Diagnosis not present

## 2023-12-25 DIAGNOSIS — R197 Diarrhea, unspecified: Secondary | ICD-10-CM | POA: Diagnosis not present

## 2023-12-25 DIAGNOSIS — L97412 Non-pressure chronic ulcer of right heel and midfoot with fat layer exposed: Secondary | ICD-10-CM | POA: Diagnosis not present

## 2023-12-25 DIAGNOSIS — I132 Hypertensive heart and chronic kidney disease with heart failure and with stage 5 chronic kidney disease, or end stage renal disease: Secondary | ICD-10-CM | POA: Diagnosis not present

## 2023-12-25 DIAGNOSIS — D631 Anemia in chronic kidney disease: Secondary | ICD-10-CM | POA: Diagnosis not present

## 2023-12-25 DIAGNOSIS — I252 Old myocardial infarction: Secondary | ICD-10-CM | POA: Diagnosis not present

## 2023-12-25 DIAGNOSIS — E113411 Type 2 diabetes mellitus with severe nonproliferative diabetic retinopathy with macular edema, right eye: Secondary | ICD-10-CM | POA: Diagnosis not present

## 2023-12-25 DIAGNOSIS — H35033 Hypertensive retinopathy, bilateral: Secondary | ICD-10-CM | POA: Diagnosis not present

## 2023-12-25 DIAGNOSIS — E1151 Type 2 diabetes mellitus with diabetic peripheral angiopathy without gangrene: Secondary | ICD-10-CM | POA: Diagnosis not present

## 2023-12-25 DIAGNOSIS — N2581 Secondary hyperparathyroidism of renal origin: Secondary | ICD-10-CM | POA: Diagnosis not present

## 2023-12-25 DIAGNOSIS — D509 Iron deficiency anemia, unspecified: Secondary | ICD-10-CM | POA: Diagnosis not present

## 2023-12-25 DIAGNOSIS — E1140 Type 2 diabetes mellitus with diabetic neuropathy, unspecified: Secondary | ICD-10-CM | POA: Diagnosis not present

## 2023-12-25 DIAGNOSIS — N186 End stage renal disease: Secondary | ICD-10-CM | POA: Diagnosis not present

## 2023-12-25 DIAGNOSIS — I70223 Atherosclerosis of native arteries of extremities with rest pain, bilateral legs: Secondary | ICD-10-CM | POA: Diagnosis not present

## 2023-12-25 DIAGNOSIS — E7800 Pure hypercholesterolemia, unspecified: Secondary | ICD-10-CM | POA: Diagnosis not present

## 2023-12-25 DIAGNOSIS — H409 Unspecified glaucoma: Secondary | ICD-10-CM | POA: Diagnosis not present

## 2023-12-25 DIAGNOSIS — M810 Age-related osteoporosis without current pathological fracture: Secondary | ICD-10-CM | POA: Diagnosis not present

## 2023-12-25 DIAGNOSIS — Z992 Dependence on renal dialysis: Secondary | ICD-10-CM | POA: Diagnosis not present

## 2023-12-25 DIAGNOSIS — J302 Other seasonal allergic rhinitis: Secondary | ICD-10-CM | POA: Diagnosis not present

## 2023-12-25 DIAGNOSIS — R52 Pain, unspecified: Secondary | ICD-10-CM | POA: Diagnosis not present

## 2023-12-25 DIAGNOSIS — E11621 Type 2 diabetes mellitus with foot ulcer: Secondary | ICD-10-CM | POA: Diagnosis not present

## 2023-12-25 DIAGNOSIS — E1129 Type 2 diabetes mellitus with other diabetic kidney complication: Secondary | ICD-10-CM | POA: Diagnosis not present

## 2023-12-25 DIAGNOSIS — I2510 Atherosclerotic heart disease of native coronary artery without angina pectoris: Secondary | ICD-10-CM | POA: Diagnosis not present

## 2023-12-25 DIAGNOSIS — I700 Atherosclerosis of aorta: Secondary | ICD-10-CM | POA: Diagnosis not present

## 2023-12-25 DIAGNOSIS — M159 Polyosteoarthritis, unspecified: Secondary | ICD-10-CM | POA: Diagnosis not present

## 2023-12-27 DIAGNOSIS — I252 Old myocardial infarction: Secondary | ICD-10-CM | POA: Diagnosis not present

## 2023-12-27 DIAGNOSIS — J302 Other seasonal allergic rhinitis: Secondary | ICD-10-CM | POA: Diagnosis not present

## 2023-12-27 DIAGNOSIS — H409 Unspecified glaucoma: Secondary | ICD-10-CM | POA: Diagnosis not present

## 2023-12-27 DIAGNOSIS — E1122 Type 2 diabetes mellitus with diabetic chronic kidney disease: Secondary | ICD-10-CM | POA: Diagnosis not present

## 2023-12-27 DIAGNOSIS — I480 Paroxysmal atrial fibrillation: Secondary | ICD-10-CM | POA: Diagnosis not present

## 2023-12-27 DIAGNOSIS — D509 Iron deficiency anemia, unspecified: Secondary | ICD-10-CM | POA: Diagnosis not present

## 2023-12-27 DIAGNOSIS — E113411 Type 2 diabetes mellitus with severe nonproliferative diabetic retinopathy with macular edema, right eye: Secondary | ICD-10-CM | POA: Diagnosis not present

## 2023-12-27 DIAGNOSIS — E1140 Type 2 diabetes mellitus with diabetic neuropathy, unspecified: Secondary | ICD-10-CM | POA: Diagnosis not present

## 2023-12-27 DIAGNOSIS — I5032 Chronic diastolic (congestive) heart failure: Secondary | ICD-10-CM | POA: Diagnosis not present

## 2023-12-27 DIAGNOSIS — I700 Atherosclerosis of aorta: Secondary | ICD-10-CM | POA: Diagnosis not present

## 2023-12-27 DIAGNOSIS — M159 Polyosteoarthritis, unspecified: Secondary | ICD-10-CM | POA: Diagnosis not present

## 2023-12-27 DIAGNOSIS — E1151 Type 2 diabetes mellitus with diabetic peripheral angiopathy without gangrene: Secondary | ICD-10-CM | POA: Diagnosis not present

## 2023-12-27 DIAGNOSIS — I70223 Atherosclerosis of native arteries of extremities with rest pain, bilateral legs: Secondary | ICD-10-CM | POA: Diagnosis not present

## 2023-12-27 DIAGNOSIS — D631 Anemia in chronic kidney disease: Secondary | ICD-10-CM | POA: Diagnosis not present

## 2023-12-27 DIAGNOSIS — R197 Diarrhea, unspecified: Secondary | ICD-10-CM | POA: Diagnosis not present

## 2023-12-27 DIAGNOSIS — M810 Age-related osteoporosis without current pathological fracture: Secondary | ICD-10-CM | POA: Diagnosis not present

## 2023-12-27 DIAGNOSIS — M797 Fibromyalgia: Secondary | ICD-10-CM | POA: Diagnosis not present

## 2023-12-27 DIAGNOSIS — E7800 Pure hypercholesterolemia, unspecified: Secondary | ICD-10-CM | POA: Diagnosis not present

## 2023-12-27 DIAGNOSIS — E1129 Type 2 diabetes mellitus with other diabetic kidney complication: Secondary | ICD-10-CM | POA: Diagnosis not present

## 2023-12-27 DIAGNOSIS — E11621 Type 2 diabetes mellitus with foot ulcer: Secondary | ICD-10-CM | POA: Diagnosis not present

## 2023-12-27 DIAGNOSIS — N186 End stage renal disease: Secondary | ICD-10-CM | POA: Diagnosis not present

## 2023-12-27 DIAGNOSIS — H35033 Hypertensive retinopathy, bilateral: Secondary | ICD-10-CM | POA: Diagnosis not present

## 2023-12-27 DIAGNOSIS — R52 Pain, unspecified: Secondary | ICD-10-CM | POA: Diagnosis not present

## 2023-12-27 DIAGNOSIS — I132 Hypertensive heart and chronic kidney disease with heart failure and with stage 5 chronic kidney disease, or end stage renal disease: Secondary | ICD-10-CM | POA: Diagnosis not present

## 2023-12-27 DIAGNOSIS — I2510 Atherosclerotic heart disease of native coronary artery without angina pectoris: Secondary | ICD-10-CM | POA: Diagnosis not present

## 2023-12-27 DIAGNOSIS — E46 Unspecified protein-calorie malnutrition: Secondary | ICD-10-CM | POA: Diagnosis not present

## 2023-12-27 DIAGNOSIS — N2581 Secondary hyperparathyroidism of renal origin: Secondary | ICD-10-CM | POA: Diagnosis not present

## 2023-12-27 DIAGNOSIS — L97412 Non-pressure chronic ulcer of right heel and midfoot with fat layer exposed: Secondary | ICD-10-CM | POA: Diagnosis not present

## 2023-12-27 DIAGNOSIS — Z992 Dependence on renal dialysis: Secondary | ICD-10-CM | POA: Diagnosis not present

## 2023-12-30 ENCOUNTER — Other Ambulatory Visit: Payer: Self-pay

## 2023-12-30 DIAGNOSIS — N2581 Secondary hyperparathyroidism of renal origin: Secondary | ICD-10-CM | POA: Diagnosis not present

## 2023-12-30 DIAGNOSIS — N186 End stage renal disease: Secondary | ICD-10-CM | POA: Diagnosis not present

## 2023-12-30 DIAGNOSIS — R52 Pain, unspecified: Secondary | ICD-10-CM | POA: Diagnosis not present

## 2023-12-30 DIAGNOSIS — I739 Peripheral vascular disease, unspecified: Secondary | ICD-10-CM

## 2023-12-30 DIAGNOSIS — I953 Hypotension of hemodialysis: Secondary | ICD-10-CM | POA: Diagnosis not present

## 2023-12-30 DIAGNOSIS — Z992 Dependence on renal dialysis: Secondary | ICD-10-CM | POA: Diagnosis not present

## 2023-12-30 DIAGNOSIS — I70234 Atherosclerosis of native arteries of right leg with ulceration of heel and midfoot: Secondary | ICD-10-CM

## 2023-12-30 DIAGNOSIS — D509 Iron deficiency anemia, unspecified: Secondary | ICD-10-CM | POA: Diagnosis not present

## 2023-12-30 DIAGNOSIS — R197 Diarrhea, unspecified: Secondary | ICD-10-CM | POA: Diagnosis not present

## 2023-12-30 DIAGNOSIS — D631 Anemia in chronic kidney disease: Secondary | ICD-10-CM | POA: Diagnosis not present

## 2023-12-31 ENCOUNTER — Telehealth: Payer: Self-pay | Admitting: Orthopedic Surgery

## 2023-12-31 DIAGNOSIS — M797 Fibromyalgia: Secondary | ICD-10-CM | POA: Diagnosis not present

## 2023-12-31 DIAGNOSIS — I252 Old myocardial infarction: Secondary | ICD-10-CM | POA: Diagnosis not present

## 2023-12-31 DIAGNOSIS — L97412 Non-pressure chronic ulcer of right heel and midfoot with fat layer exposed: Secondary | ICD-10-CM | POA: Diagnosis not present

## 2023-12-31 DIAGNOSIS — I2510 Atherosclerotic heart disease of native coronary artery without angina pectoris: Secondary | ICD-10-CM | POA: Diagnosis not present

## 2023-12-31 DIAGNOSIS — E1140 Type 2 diabetes mellitus with diabetic neuropathy, unspecified: Secondary | ICD-10-CM | POA: Diagnosis not present

## 2023-12-31 DIAGNOSIS — H35033 Hypertensive retinopathy, bilateral: Secondary | ICD-10-CM | POA: Diagnosis not present

## 2023-12-31 DIAGNOSIS — I5032 Chronic diastolic (congestive) heart failure: Secondary | ICD-10-CM | POA: Diagnosis not present

## 2023-12-31 DIAGNOSIS — E1151 Type 2 diabetes mellitus with diabetic peripheral angiopathy without gangrene: Secondary | ICD-10-CM | POA: Diagnosis not present

## 2023-12-31 DIAGNOSIS — I480 Paroxysmal atrial fibrillation: Secondary | ICD-10-CM | POA: Diagnosis not present

## 2023-12-31 DIAGNOSIS — D631 Anemia in chronic kidney disease: Secondary | ICD-10-CM | POA: Diagnosis not present

## 2023-12-31 DIAGNOSIS — I700 Atherosclerosis of aorta: Secondary | ICD-10-CM | POA: Diagnosis not present

## 2023-12-31 DIAGNOSIS — M159 Polyosteoarthritis, unspecified: Secondary | ICD-10-CM | POA: Diagnosis not present

## 2023-12-31 DIAGNOSIS — E1122 Type 2 diabetes mellitus with diabetic chronic kidney disease: Secondary | ICD-10-CM | POA: Diagnosis not present

## 2023-12-31 DIAGNOSIS — E46 Unspecified protein-calorie malnutrition: Secondary | ICD-10-CM | POA: Diagnosis not present

## 2023-12-31 DIAGNOSIS — I132 Hypertensive heart and chronic kidney disease with heart failure and with stage 5 chronic kidney disease, or end stage renal disease: Secondary | ICD-10-CM | POA: Diagnosis not present

## 2023-12-31 DIAGNOSIS — I70223 Atherosclerosis of native arteries of extremities with rest pain, bilateral legs: Secondary | ICD-10-CM | POA: Diagnosis not present

## 2023-12-31 DIAGNOSIS — E7800 Pure hypercholesterolemia, unspecified: Secondary | ICD-10-CM | POA: Diagnosis not present

## 2023-12-31 DIAGNOSIS — J302 Other seasonal allergic rhinitis: Secondary | ICD-10-CM | POA: Diagnosis not present

## 2023-12-31 DIAGNOSIS — E11621 Type 2 diabetes mellitus with foot ulcer: Secondary | ICD-10-CM | POA: Diagnosis not present

## 2023-12-31 DIAGNOSIS — E113411 Type 2 diabetes mellitus with severe nonproliferative diabetic retinopathy with macular edema, right eye: Secondary | ICD-10-CM | POA: Diagnosis not present

## 2023-12-31 DIAGNOSIS — M810 Age-related osteoporosis without current pathological fracture: Secondary | ICD-10-CM | POA: Diagnosis not present

## 2023-12-31 DIAGNOSIS — H409 Unspecified glaucoma: Secondary | ICD-10-CM | POA: Diagnosis not present

## 2023-12-31 NOTE — Telephone Encounter (Signed)
 [place] (RN) from [facility] called requesting a call back on secure line for wound care orders. Please call [place] at [phone]

## 2024-01-01 DIAGNOSIS — R197 Diarrhea, unspecified: Secondary | ICD-10-CM | POA: Diagnosis not present

## 2024-01-01 DIAGNOSIS — Z992 Dependence on renal dialysis: Secondary | ICD-10-CM | POA: Diagnosis not present

## 2024-01-01 DIAGNOSIS — D631 Anemia in chronic kidney disease: Secondary | ICD-10-CM | POA: Diagnosis not present

## 2024-01-01 DIAGNOSIS — R52 Pain, unspecified: Secondary | ICD-10-CM | POA: Diagnosis not present

## 2024-01-01 DIAGNOSIS — D509 Iron deficiency anemia, unspecified: Secondary | ICD-10-CM | POA: Diagnosis not present

## 2024-01-01 DIAGNOSIS — N186 End stage renal disease: Secondary | ICD-10-CM | POA: Diagnosis not present

## 2024-01-01 DIAGNOSIS — I953 Hypotension of hemodialysis: Secondary | ICD-10-CM | POA: Diagnosis not present

## 2024-01-01 DIAGNOSIS — N2581 Secondary hyperparathyroidism of renal origin: Secondary | ICD-10-CM | POA: Diagnosis not present

## 2024-01-03 ENCOUNTER — Other Ambulatory Visit: Payer: Self-pay | Admitting: Orthopedic Surgery

## 2024-01-03 DIAGNOSIS — I132 Hypertensive heart and chronic kidney disease with heart failure and with stage 5 chronic kidney disease, or end stage renal disease: Secondary | ICD-10-CM | POA: Diagnosis not present

## 2024-01-03 DIAGNOSIS — M797 Fibromyalgia: Secondary | ICD-10-CM | POA: Diagnosis not present

## 2024-01-03 DIAGNOSIS — N2581 Secondary hyperparathyroidism of renal origin: Secondary | ICD-10-CM | POA: Diagnosis not present

## 2024-01-03 DIAGNOSIS — H35033 Hypertensive retinopathy, bilateral: Secondary | ICD-10-CM | POA: Diagnosis not present

## 2024-01-03 DIAGNOSIS — I480 Paroxysmal atrial fibrillation: Secondary | ICD-10-CM | POA: Diagnosis not present

## 2024-01-03 DIAGNOSIS — D631 Anemia in chronic kidney disease: Secondary | ICD-10-CM | POA: Diagnosis not present

## 2024-01-03 DIAGNOSIS — E11621 Type 2 diabetes mellitus with foot ulcer: Secondary | ICD-10-CM | POA: Diagnosis not present

## 2024-01-03 DIAGNOSIS — N186 End stage renal disease: Secondary | ICD-10-CM | POA: Diagnosis not present

## 2024-01-03 DIAGNOSIS — I953 Hypotension of hemodialysis: Secondary | ICD-10-CM | POA: Diagnosis not present

## 2024-01-03 DIAGNOSIS — I5032 Chronic diastolic (congestive) heart failure: Secondary | ICD-10-CM | POA: Diagnosis not present

## 2024-01-03 DIAGNOSIS — I70223 Atherosclerosis of native arteries of extremities with rest pain, bilateral legs: Secondary | ICD-10-CM | POA: Diagnosis not present

## 2024-01-03 DIAGNOSIS — E7800 Pure hypercholesterolemia, unspecified: Secondary | ICD-10-CM | POA: Diagnosis not present

## 2024-01-03 DIAGNOSIS — J302 Other seasonal allergic rhinitis: Secondary | ICD-10-CM | POA: Diagnosis not present

## 2024-01-03 DIAGNOSIS — I700 Atherosclerosis of aorta: Secondary | ICD-10-CM | POA: Diagnosis not present

## 2024-01-03 DIAGNOSIS — D509 Iron deficiency anemia, unspecified: Secondary | ICD-10-CM | POA: Diagnosis not present

## 2024-01-03 DIAGNOSIS — E113411 Type 2 diabetes mellitus with severe nonproliferative diabetic retinopathy with macular edema, right eye: Secondary | ICD-10-CM | POA: Diagnosis not present

## 2024-01-03 DIAGNOSIS — E1122 Type 2 diabetes mellitus with diabetic chronic kidney disease: Secondary | ICD-10-CM | POA: Diagnosis not present

## 2024-01-03 DIAGNOSIS — L97412 Non-pressure chronic ulcer of right heel and midfoot with fat layer exposed: Secondary | ICD-10-CM | POA: Diagnosis not present

## 2024-01-03 DIAGNOSIS — E1140 Type 2 diabetes mellitus with diabetic neuropathy, unspecified: Secondary | ICD-10-CM | POA: Diagnosis not present

## 2024-01-03 DIAGNOSIS — M810 Age-related osteoporosis without current pathological fracture: Secondary | ICD-10-CM | POA: Diagnosis not present

## 2024-01-03 DIAGNOSIS — I2510 Atherosclerotic heart disease of native coronary artery without angina pectoris: Secondary | ICD-10-CM | POA: Diagnosis not present

## 2024-01-03 DIAGNOSIS — R197 Diarrhea, unspecified: Secondary | ICD-10-CM | POA: Diagnosis not present

## 2024-01-03 DIAGNOSIS — M159 Polyosteoarthritis, unspecified: Secondary | ICD-10-CM | POA: Diagnosis not present

## 2024-01-03 DIAGNOSIS — Z992 Dependence on renal dialysis: Secondary | ICD-10-CM | POA: Diagnosis not present

## 2024-01-03 DIAGNOSIS — H409 Unspecified glaucoma: Secondary | ICD-10-CM | POA: Diagnosis not present

## 2024-01-03 DIAGNOSIS — R52 Pain, unspecified: Secondary | ICD-10-CM | POA: Diagnosis not present

## 2024-01-03 DIAGNOSIS — E46 Unspecified protein-calorie malnutrition: Secondary | ICD-10-CM | POA: Diagnosis not present

## 2024-01-03 DIAGNOSIS — I252 Old myocardial infarction: Secondary | ICD-10-CM | POA: Diagnosis not present

## 2024-01-03 DIAGNOSIS — E1151 Type 2 diabetes mellitus with diabetic peripheral angiopathy without gangrene: Secondary | ICD-10-CM | POA: Diagnosis not present

## 2024-01-03 MED ORDER — COLLAGENASE 250 UNIT/GM EX OINT: 1.0000 | TOPICAL_OINTMENT | Freq: Every day | CUTANEOUS | 3 refills | Status: DC

## 2024-01-06 ENCOUNTER — Inpatient Hospital Stay (HOSPITAL_COMMUNITY)
Admission: EM | Admit: 2024-01-06 | Discharge: 2024-01-23 | DRG: 853 | Disposition: A | Discharge status: Skilled Nursing Facility | Attending: Internal Medicine | Admitting: Internal Medicine

## 2024-01-06 ENCOUNTER — Emergency Department (HOSPITAL_COMMUNITY)

## 2024-01-06 ENCOUNTER — Encounter (HOSPITAL_COMMUNITY): Payer: Self-pay

## 2024-01-06 DIAGNOSIS — R6520 Severe sepsis without septic shock: Secondary | ICD-10-CM | POA: Diagnosis not present

## 2024-01-06 DIAGNOSIS — Z800 Family history of malignant neoplasm of digestive organs: Secondary | ICD-10-CM

## 2024-01-06 DIAGNOSIS — L089 Local infection of the skin and subcutaneous tissue, unspecified: Secondary | ICD-10-CM | POA: Diagnosis not present

## 2024-01-06 DIAGNOSIS — Z833 Family history of diabetes mellitus: Secondary | ICD-10-CM

## 2024-01-06 DIAGNOSIS — E11628 Type 2 diabetes mellitus with other skin complications: Secondary | ICD-10-CM | POA: Diagnosis not present

## 2024-01-06 DIAGNOSIS — D631 Anemia in chronic kidney disease: Secondary | ICD-10-CM | POA: Diagnosis not present

## 2024-01-06 DIAGNOSIS — N186 End stage renal disease: Secondary | ICD-10-CM | POA: Diagnosis present

## 2024-01-06 DIAGNOSIS — I953 Hypotension of hemodialysis: Secondary | ICD-10-CM | POA: Diagnosis not present

## 2024-01-06 DIAGNOSIS — I2510 Atherosclerotic heart disease of native coronary artery without angina pectoris: Secondary | ICD-10-CM | POA: Diagnosis present

## 2024-01-06 DIAGNOSIS — Z992 Dependence on renal dialysis: Secondary | ICD-10-CM | POA: Diagnosis not present

## 2024-01-06 DIAGNOSIS — Z91148 Patient's other noncompliance with medication regimen for other reason: Secondary | ICD-10-CM

## 2024-01-06 DIAGNOSIS — E785 Hyperlipidemia, unspecified: Secondary | ICD-10-CM | POA: Diagnosis present

## 2024-01-06 DIAGNOSIS — N400 Enlarged prostate without lower urinary tract symptoms: Secondary | ICD-10-CM | POA: Diagnosis present

## 2024-01-06 DIAGNOSIS — L89152 Pressure ulcer of sacral region, stage 2: Secondary | ICD-10-CM | POA: Diagnosis present

## 2024-01-06 DIAGNOSIS — N2581 Secondary hyperparathyroidism of renal origin: Secondary | ICD-10-CM | POA: Diagnosis not present

## 2024-01-06 DIAGNOSIS — E1169 Type 2 diabetes mellitus with other specified complication: Secondary | ICD-10-CM | POA: Diagnosis not present

## 2024-01-06 DIAGNOSIS — I10 Essential (primary) hypertension: Secondary | ICD-10-CM | POA: Diagnosis not present

## 2024-01-06 DIAGNOSIS — Z83438 Family history of other disorder of lipoprotein metabolism and other lipidemia: Secondary | ICD-10-CM

## 2024-01-06 DIAGNOSIS — M810 Age-related osteoporosis without current pathological fracture: Secondary | ICD-10-CM | POA: Diagnosis present

## 2024-01-06 DIAGNOSIS — E11621 Type 2 diabetes mellitus with foot ulcer: Secondary | ICD-10-CM | POA: Diagnosis not present

## 2024-01-06 DIAGNOSIS — L97414 Non-pressure chronic ulcer of right heel and midfoot with necrosis of bone: Secondary | ICD-10-CM | POA: Diagnosis not present

## 2024-01-06 DIAGNOSIS — I252 Old myocardial infarction: Secondary | ICD-10-CM

## 2024-01-06 DIAGNOSIS — T82838A Hemorrhage of vascular prosthetic devices, implants and grafts, initial encounter: Secondary | ICD-10-CM | POA: Diagnosis not present

## 2024-01-06 DIAGNOSIS — Z89611 Acquired absence of right leg above knee: Secondary | ICD-10-CM | POA: Diagnosis not present

## 2024-01-06 DIAGNOSIS — Y712 Prosthetic and other implants, materials and accessory cardiovascular devices associated with adverse incidents: Secondary | ICD-10-CM | POA: Diagnosis not present

## 2024-01-06 DIAGNOSIS — I96 Gangrene, not elsewhere classified: Secondary | ICD-10-CM

## 2024-01-06 DIAGNOSIS — K521 Toxic gastroenteritis and colitis: Secondary | ICD-10-CM | POA: Diagnosis not present

## 2024-01-06 DIAGNOSIS — E1152 Type 2 diabetes mellitus with diabetic peripheral angiopathy with gangrene: Secondary | ICD-10-CM | POA: Diagnosis not present

## 2024-01-06 DIAGNOSIS — E1142 Type 2 diabetes mellitus with diabetic polyneuropathy: Secondary | ICD-10-CM | POA: Diagnosis not present

## 2024-01-06 DIAGNOSIS — I132 Hypertensive heart and chronic kidney disease with heart failure and with stage 5 chronic kidney disease, or end stage renal disease: Secondary | ICD-10-CM | POA: Diagnosis not present

## 2024-01-06 DIAGNOSIS — Z794 Long term (current) use of insulin: Secondary | ICD-10-CM

## 2024-01-06 DIAGNOSIS — Z604 Social exclusion and rejection: Secondary | ICD-10-CM | POA: Diagnosis present

## 2024-01-06 DIAGNOSIS — M2041 Other hammer toe(s) (acquired), right foot: Secondary | ICD-10-CM | POA: Diagnosis not present

## 2024-01-06 DIAGNOSIS — A419 Sepsis, unspecified organism: Secondary | ICD-10-CM | POA: Diagnosis not present

## 2024-01-06 DIAGNOSIS — E113291 Type 2 diabetes mellitus with mild nonproliferative diabetic retinopathy without macular edema, right eye: Secondary | ICD-10-CM | POA: Diagnosis present

## 2024-01-06 DIAGNOSIS — Z635 Disruption of family by separation and divorce: Secondary | ICD-10-CM

## 2024-01-06 DIAGNOSIS — E11649 Type 2 diabetes mellitus with hypoglycemia without coma: Secondary | ICD-10-CM | POA: Diagnosis not present

## 2024-01-06 DIAGNOSIS — M898X9 Other specified disorders of bone, unspecified site: Secondary | ICD-10-CM | POA: Diagnosis present

## 2024-01-06 DIAGNOSIS — Z8701 Personal history of pneumonia (recurrent): Secondary | ICD-10-CM

## 2024-01-06 DIAGNOSIS — E113592 Type 2 diabetes mellitus with proliferative diabetic retinopathy without macular edema, left eye: Secondary | ICD-10-CM | POA: Diagnosis present

## 2024-01-06 DIAGNOSIS — I5032 Chronic diastolic (congestive) heart failure: Secondary | ICD-10-CM | POA: Diagnosis present

## 2024-01-06 DIAGNOSIS — L97419 Non-pressure chronic ulcer of right heel and midfoot with unspecified severity: Secondary | ICD-10-CM | POA: Diagnosis present

## 2024-01-06 DIAGNOSIS — R52 Pain, unspecified: Secondary | ICD-10-CM | POA: Diagnosis not present

## 2024-01-06 DIAGNOSIS — Z1152 Encounter for screening for COVID-19: Secondary | ICD-10-CM | POA: Diagnosis not present

## 2024-01-06 DIAGNOSIS — I871 Compression of vein: Secondary | ICD-10-CM | POA: Diagnosis present

## 2024-01-06 DIAGNOSIS — Z87891 Personal history of nicotine dependence: Secondary | ICD-10-CM

## 2024-01-06 DIAGNOSIS — Z96651 Presence of right artificial knee joint: Secondary | ICD-10-CM | POA: Diagnosis present

## 2024-01-06 DIAGNOSIS — I4819 Other persistent atrial fibrillation: Secondary | ICD-10-CM | POA: Diagnosis not present

## 2024-01-06 DIAGNOSIS — R600 Localized edema: Secondary | ICD-10-CM | POA: Diagnosis not present

## 2024-01-06 DIAGNOSIS — M797 Fibromyalgia: Secondary | ICD-10-CM | POA: Diagnosis present

## 2024-01-06 DIAGNOSIS — E1122 Type 2 diabetes mellitus with diabetic chronic kidney disease: Secondary | ICD-10-CM | POA: Diagnosis present

## 2024-01-06 DIAGNOSIS — M86171 Other acute osteomyelitis, right ankle and foot: Secondary | ICD-10-CM | POA: Diagnosis not present

## 2024-01-06 DIAGNOSIS — D509 Iron deficiency anemia, unspecified: Secondary | ICD-10-CM | POA: Diagnosis not present

## 2024-01-06 DIAGNOSIS — Z7901 Long term (current) use of anticoagulants: Secondary | ICD-10-CM

## 2024-01-06 DIAGNOSIS — T3695XA Adverse effect of unspecified systemic antibiotic, initial encounter: Secondary | ICD-10-CM | POA: Diagnosis present

## 2024-01-06 DIAGNOSIS — M869 Osteomyelitis, unspecified: Principal | ICD-10-CM | POA: Diagnosis present

## 2024-01-06 DIAGNOSIS — R197 Diarrhea, unspecified: Secondary | ICD-10-CM | POA: Diagnosis not present

## 2024-01-06 DIAGNOSIS — Z8249 Family history of ischemic heart disease and other diseases of the circulatory system: Secondary | ICD-10-CM

## 2024-01-06 DIAGNOSIS — S91301A Unspecified open wound, right foot, initial encounter: Secondary | ICD-10-CM | POA: Diagnosis not present

## 2024-01-06 DIAGNOSIS — Z7982 Long term (current) use of aspirin: Secondary | ICD-10-CM

## 2024-01-06 DIAGNOSIS — M19071 Primary osteoarthritis, right ankle and foot: Secondary | ICD-10-CM | POA: Diagnosis not present

## 2024-01-06 LAB — COMPREHENSIVE METABOLIC PANEL
ALT: 7 U/L (ref 0–44)
AST: 17 U/L (ref 15–41)
Albumin: 3 g/dL — ABNORMAL LOW (ref 3.5–5.0)
Alkaline Phosphatase: 81 U/L (ref 38–126)
Anion gap: 18 — ABNORMAL HIGH (ref 5–15)
BUN: 15 mg/dL (ref 8–23)
CO2: 27 mmol/L (ref 22–32)
Calcium: 8.6 mg/dL — ABNORMAL LOW (ref 8.9–10.3)
Chloride: 95 mmol/L — ABNORMAL LOW (ref 98–111)
Creatinine, Ser: 4.32 mg/dL — ABNORMAL HIGH (ref 0.61–1.24)
GFR, Estimated: 14 mL/min — ABNORMAL LOW (ref >60)
Glucose, Bld: 96 mg/dL (ref 70–99)
Potassium: 3.5 mmol/L (ref 3.5–5.1)
Sodium: 140 mmol/L (ref 135–145)
Total Bilirubin: 1.4 mg/dL — ABNORMAL HIGH (ref 0.0–1.2)
Total Protein: 8.9 g/dL — ABNORMAL HIGH (ref 6.5–8.1)

## 2024-01-06 LAB — CBC WITH DIFFERENTIAL/PLATELET
Abs Immature Granulocytes: 0.09 10*3/uL — ABNORMAL HIGH (ref 0.00–0.07)
Basophils Absolute: 0 10*3/uL (ref 0.0–0.1)
Basophils Relative: 0 %
Eosinophils Absolute: 0.1 10*3/uL (ref 0.0–0.5)
Eosinophils Relative: 1 %
HCT: 40.3 % (ref 39.0–52.0)
Hemoglobin: 12.5 g/dL — ABNORMAL LOW (ref 13.0–17.0)
Immature Granulocytes: 1 %
Lymphocytes Relative: 4 %
Lymphs Abs: 0.5 10*3/uL — ABNORMAL LOW (ref 0.7–4.0)
MCH: 28.8 pg (ref 26.0–34.0)
MCHC: 31 g/dL (ref 30.0–36.0)
MCV: 92.9 fL (ref 80.0–100.0)
Monocytes Absolute: 0.9 10*3/uL (ref 0.1–1.0)
Monocytes Relative: 6 %
Neutro Abs: 12.1 10*3/uL — ABNORMAL HIGH (ref 1.7–7.7)
Neutrophils Relative %: 88 %
Platelets: 189 10*3/uL (ref 150–400)
RBC: 4.34 MIL/uL (ref 4.22–5.81)
RDW: 15.5 % (ref 11.5–15.5)
WBC: 13.8 10*3/uL — ABNORMAL HIGH (ref 4.0–10.5)
nRBC: 0 % (ref 0.0–0.2)

## 2024-01-06 LAB — RESP PANEL BY RT-PCR (RSV, FLU A&B, COVID)  RVPGX2
Influenza A by PCR: NEGATIVE
Influenza B by PCR: NEGATIVE
Resp Syncytial Virus by PCR: NEGATIVE
SARS Coronavirus 2 by RT PCR: NEGATIVE

## 2024-01-06 LAB — CBG MONITORING, ED: Glucose-Capillary: 92 mg/dL (ref 70–99)

## 2024-01-06 LAB — I-STAT CG4 LACTIC ACID, ED: Lactic Acid, Venous: 2 mmol/L (ref 0.5–1.9)

## 2024-01-06 MED ORDER — VANCOMYCIN HCL IN DEXTROSE 1-5 GM/200ML-% IV SOLN
1000.0000 mg | Freq: Once | INTRAVENOUS | Status: AC
  Administered 2024-01-06: 1000 mg via INTRAVENOUS
  Filled 2024-01-06: qty 200

## 2024-01-06 MED ORDER — SODIUM CHLORIDE 0.9 % IV BOLUS
1000.0000 mL | Freq: Once | INTRAVENOUS | Status: AC
  Administered 2024-01-06: 1000 mL via INTRAVENOUS

## 2024-01-06 MED ORDER — LACTATED RINGERS IV BOLUS (SEPSIS)
1000.0000 mL | Freq: Once | INTRAVENOUS | Status: AC
  Administered 2024-01-07: 1000 mL via INTRAVENOUS

## 2024-01-06 MED ORDER — SODIUM CHLORIDE 0.9 % IV SOLN: INTRAVENOUS | Status: DC

## 2024-01-06 MED ORDER — ACETAMINOPHEN 325 MG PO TABS
650.0000 mg | ORAL_TABLET | Freq: Once | ORAL | Status: AC | PRN
  Administered 2024-01-06: 650 mg via ORAL
  Filled 2024-01-06: qty 2

## 2024-01-06 MED ORDER — LACTATED RINGERS IV BOLUS (SEPSIS)
1000.0000 mL | Freq: Once | INTRAVENOUS | Status: DC
Start: 2024-01-07 — End: 2024-01-08

## 2024-01-06 MED ORDER — SODIUM CHLORIDE 0.9 % IV SOLN
3.0000 g | Freq: Once | INTRAVENOUS | Status: DC
  Filled 2024-01-06: qty 8

## 2024-01-06 MED ORDER — LACTATED RINGERS IV SOLN: INTRAVENOUS | Status: AC

## 2024-01-06 NOTE — ED Provider Triage Note (Signed)
 Emergency Medicine Provider Triage Evaluation Note  William Webb , a 73 y.o. male  was evaluated in triage.  Pt complains of lethargy fatigue and chronic right foot wound which has been worsening.  Concern for osteo To dialysis and completed the session today Review of Systems  Positive: Weakness foot pain Negative: Fever  Physical Exam  BP (!) 164/104 (BP Location: Left Arm)   Pulse 90   Temp 99.6 F (37.6 C)   Resp 16   SpO2 92%  Gen:   Awake, no distress   Resp:  Normal effort  MSK:   Moves extremities without difficulty  Other:    Medical Decision Making  Medically screening exam initiated at 4:54 PM.  Appropriate orders placed.  William Webb [facility] was informed that the remainder of the evaluation will be completed by another provider, this initial triage assessment does not replace that evaluation, and the importance of remaining in the ED until their evaluation is complete.     Arthor Captain, PA-C 01/06/24 1655

## 2024-01-06 NOTE — ED Triage Notes (Signed)
 Pt arrives via POV from home. Pt has a chronic wound on his right foot. Family reports he has been more lethargic and fatigued. Pt made it through a complete session of dialysis today. Family is concerned that the wound on his foot is getting worse. Pt is AxOx4.

## 2024-01-06 NOTE — ED Provider Notes (Signed)
 [place] EMERGENCY DEPARTMENT AT [facility] Provider Note   CSN: [id number] Arrival date & time: 01/06/24  1609     History {Add pertinent medical, surgical, social history, OB history to HPI:1} Chief Complaint  Patient presents with   Wound Check   Fatigue    Irbin Fines is a 73 y.o. male.  Patient with past medical history significant for ESRD on Monday Wednesday Friday dialysis, type II DM, hypertension, left BKA presents to the emergency department complaining of possible fever with increased fatigue for the past 2 to 3 days.  He has a known chronic wound on the right foot and the wound has had increased drainage recently.  He is currently followed by Dr. Lajoyce Corners.  Patient was noted to spike a fever of one 101.5 Fahrenheit, was tachycardic, and had a leukocytosis with a white count of 13,800. Code Sepsis activated.  Patient denies nausea, vomiting, shortness of breath, abdominal pain.  Patient did have dialysis earlier today and completed a full session.   Wound Check       Home Medications Prior to Admission medications   Medication Sig Start Date End Date Taking? Authorizing Provider  acetaminophen (TYLENOL) 325 MG tablet Take 1-2 tablets (325-650 mg total) by mouth every 6 (six) hours as needed for mild pain (pain score 1-3 or temp > 100.5). 06/11/23   Zigmund Daniel., MD  apixaban (ELIQUIS) 5 MG TABS tablet Take 1 tablet (5 mg total) by mouth 2 (two) times daily. 11/01/23   Lars Mage, PA-C  apixaban (ELIQUIS) 5 MG TABS tablet Take 1 tablet (5 mg total) by mouth 2 (two) times daily. 11/28/23   Patwardhan, Anabel Bene, MD  aspirin EC 81 MG tablet Take 81 mg by mouth daily. Swallow whole.    [provider]  atorvastatin (LIPITOR) 80 MG tablet Take 80 mg by mouth daily.    [provider]  carvedilol (COREG) 25 MG tablet Take 1 tablet (25 mg total) by mouth 2 (two) times daily. 06/11/23   Zigmund Daniel., MD  collagenase  (SANTYL) 250 UNIT/GM ointment Apply 1 Application topically daily. Apply to the affected area daily plus dry dressing 01/03/24   [name], MD  doxycycline (VIBRA-TABS) 100 MG tablet Take 1 tablet (100 mg total) by mouth 2 (two) times daily. 11/28/23   Lenn Sink, DPM  ethyl chloride spray Apply 1 Application topically 3 (three) times a week. At dialysis 04/25/23   [provider]  finasteride (PROSCAR) 5 MG tablet Take 5 mg by mouth daily. 11/08/22   [provider]  gabapentin (NEURONTIN) 600 MG tablet Take 0.5 tablets (300 mg total) by mouth 2 (two) times daily. Patient taking differently: Take 600 mg by mouth 2 (two) times daily. 08/02/20   Calvert Cantor, MD  insulin aspart (NOVOLOG) 100 UNIT/ML injection Inject 3 Units into the skin daily as needed for high blood sugar.    [provider]  isosorbide mononitrate (IMDUR) 60 MG 24 hr tablet Take 1 tablet (60 mg total) by mouth daily. 08/03/20   Calvert Cantor, MD  lidocaine-prilocaine (EMLA) cream Apply 1 application  topically as needed (prior to port being accessed). 07/05/21   [provider]  linaclotide (LINZESS) 145 MCG CAPS capsule Take 145 mcg by mouth as needed (constipation).    [provider]  loperamide (IMODIUM) 2 MG capsule Take 1 capsule (2 mg total) by mouth every 6 (six) hours as needed for diarrhea or  loose stools. 10/23/23   Leroy Sea, MD  loratadine (CLARITIN) 10 MG tablet Take 10 mg by mouth daily as needed for allergies.    [provider]  omeprazole (PRILOSEC) 20 MG capsule Take 20 mg by mouth daily.    [provider]  oxyCODONE (OXY IR/ROXICODONE) 5 MG immediate release tablet Take 1-2 tablets (5-10 mg total) by mouth every 4 (four) hours as needed for moderate pain (pain score 4-6). 11/01/23   Lars Mage, PA-C  polyethylene glycol powder (GLYCOLAX/MIRALAX) 17 GM/SCOOP powder Take 17 g by mouth in the morning, at noon, and at bedtime. One scoop in  beverage of your choice with each meal. You may increase if you continue to be constipated and decrease if your stool becomes too loose. Patient taking differently: Take 17 g by mouth daily as needed for moderate constipation. 02/27/23   Gailen Shelter, PA  sevelamer carbonate (RENVELA) 800 MG tablet Take 2 tablets (1,600 mg total) by mouth 3 (three) times daily with meals. 06/11/23   Zigmund Daniel., MD  tamsulosin (FLOMAX) 0.4 MG CAPS capsule Take 0.4 mg by mouth at bedtime. 11/16/19   [provider]      Allergies    Patient has no known allergies.    Review of Systems   Review of Systems  Physical Exam Updated Vital Signs BP (!) 147/76 (BP Location: Right Arm)   Pulse (!) 105   Temp (!) 101.5 F (38.6 C)   Resp 16   SpO2 92%  Physical Exam Vitals and nursing note reviewed.  Constitutional:      Appearance: He is well-developed.  HENT:     Head: Normocephalic and atraumatic.  Eyes:     Conjunctiva/sclera: Conjunctivae normal.  Cardiovascular:     Rate and Rhythm: Normal rate and regular rhythm.  Pulmonary:     Effort: Pulmonary effort is normal.     Breath sounds: Normal breath sounds.  Musculoskeletal:        General: No swelling.     Cervical back: Neck supple.     Comments: Wound on right heel.  See imaging below.  Left BKA  Skin:    General: Skin is warm and dry.     Capillary Refill: Capillary refill takes less than 2 seconds.  Neurological:     Mental Status: He is alert.  Psychiatric:        Mood and Affect: Mood normal.     ED Results / Procedures / Treatments   Labs (all labs ordered are listed, but only abnormal results are displayed) Labs Reviewed  COMPREHENSIVE METABOLIC PANEL - Abnormal; Notable for the following components:      Result Value   Chloride 95 (*)    Creatinine, Ser 4.32 (*)    Calcium 8.6 (*)    Total Protein 8.9 (*)    Albumin 3.0 (*)    Total Bilirubin 1.4 (*)    GFR, Estimated 14 (*)    Anion gap 18 (*)     All other components within normal limits  CBC WITH DIFFERENTIAL/PLATELET - Abnormal; Notable for the following components:   WBC 13.8 (*)    Hemoglobin 12.5 (*)    Neutro Abs 12.1 (*)    Lymphs Abs 0.5 (*)    Abs Immature Granulocytes 0.09 (*)    All other components within normal limits  I-STAT CG4 LACTIC ACID, ED - Abnormal; Notable for the following components:   Lactic Acid, Venous 2.0 (*)  All other components within normal limits  RESP PANEL BY RT-PCR (RSV, FLU A&B, COVID)  RVPGX2  CULTURE, BLOOD (ROUTINE X 2)  CULTURE, BLOOD (ROUTINE X 2)  CBG MONITORING, ED    EKG None  Radiology DG Foot Complete Right Result Date: 01/06/2024 CLINICAL DATA:  Chronic foot wound. EXAM: RIGHT FOOT COMPLETE - 3+ VIEW COMPARISON:  10/20/2023 FINDINGS: In previous resection of the second toe. There is no obvious bony destructive or erosive change. No fracture. Hammertoe deformity of the toes. Degenerative change of the first metatarsal phalangeal joint. A dressing overlies the heel. No obvious soft tissue gas radiopaque foreign body. Artifact from overlying sock. IMPRESSION: 1. Previous resection of the second toe. 2. A dressing overlies the heel. Evidence of calcaneal osteomyelitis. 3. Degenerative change of the first metatarsophalangeal joint. Electronically Signed   By: Narda Rutherford M.D.   On: 01/06/2024 20:25    Procedures .Critical Care  Performed by: Darrick Grinder, PA-C Authorized by: Darrick Grinder, PA-C   Critical care provider statement:    Critical care time (minutes):  30   Critical care time was exclusive of:  Separately billable procedures and treating other patients   Critical care was necessary to treat or prevent imminent or life-threatening deterioration of the following conditions:  Sepsis   Critical care was time spent personally by me on the following activities:  Development of treatment plan with patient or surrogate, discussions with consultants, evaluation of  patient's response to treatment, examination of patient, ordering and review of laboratory studies, ordering and review of radiographic studies, ordering and performing treatments and interventions, pulse oximetry, re-evaluation of patient's condition and review of old charts   Care discussed with: admitting provider     {Document cardiac monitor, telemetry assessment procedure when appropriate:1}  Medications Ordered in ED Medications  acetaminophen (TYLENOL) tablet 650 mg (650 mg Oral Given 01/06/24 2141)    ED Course/ Medical Decision Making/ A&P   {   Click here for ABCD2, HEART and other calculatorsREFRESH Note before signing :1}                              Medical Decision Making  This patient presents to the ED for concern of fatigue and fever, this involves an extensive number of treatment options, and is a complaint that carries with it a high risk of complications and morbidity.  The differential diagnosis includes diabetic foot wound, osteomyelitis, sepsis, others   Co morbidities that complicate the patient evaluation  ESRD, type II DM, chronic foot wound   Additional history obtained:  Additional history obtained from patient's brother at bedside External records from outside source obtained and reviewed including the patient notes   Lab Tests:  I Ordered, and personally interpreted labs.  The pertinent results include:  WBC 13,800, initial lactic acid 2.0, negative respiratory panel   Imaging Studies ordered:  I ordered imaging studies including plain films of the right foot and MR of the right foot I independently visualized and interpreted imaging which showed  1. Previous resection of the second toe.  2. A dressing overlies the heel. Evidence of calcaneal  osteomyelitis.  3. Degenerative change of the first metatarsophalangeal joint.  MRI results pending I agree with the radiologist interpretation   Consultations Obtained:  I requested consultation  with the orthopedic surgeon, Dr.Xu,  and discussed lab and imaging findings as well as pertinent plan - they recommend: Antibiotics, medicine admission.  Formal consult in AM. I requested consultation with the hospitalist service.   Problem List / ED Course / Critical interventions / Medication management   I ordered medication including Unasyn and vancomycin for antibiotic coverage, LR bolus for sepsis, Tylenol for fever Reevaluation of the patient after these medicines showed that the patient improved I have reviewed the patients home medicines and have made adjustments as needed   Social Determinants of Health:  Social Drivers of Health with Concerns   Tobacco Use: Medium Risk (01/06/2024)   Patient History    Smoking Tobacco Use: Former    Smokeless Tobacco Use: Never    Passive Exposure: Not on Actuary Strain: Not on file  Physical Activity: Not on file  Stress: Not on file  Social Connections: Not on file  Alcohol Screen: Not on file  Health Literacy: Not on file      Test / Admission - Considered:  Patient with what appears to be osteomyelitis of the right heel on plain films of the right foot.  Antibiotic medications are ordered.  Sepsis alert activated.  Patient will be evaluated formally by orthopedics tomorrow and once MRI results are available.  Patient to be admitted to medicine service.   {Document critical care time when appropriate:1} {Document review of labs and clinical decision tools ie heart score, Chads2Vasc2 etc:1}  {Document your independent review of radiology images, and any outside records:1} {Document your discussion with family members, caretakers, and with consultants:1} {Document social determinants of health affecting pt's care:1} {Document your decision making why or why not admission, treatments were needed:1} Final Clinical Impression(s) / ED Diagnoses Final diagnoses:  None    Rx / DC Orders ED Discharge Orders      None

## 2024-01-07 ENCOUNTER — Other Ambulatory Visit: Payer: Self-pay

## 2024-01-07 ENCOUNTER — Observation Stay (HOSPITAL_COMMUNITY)

## 2024-01-07 DIAGNOSIS — E11628 Type 2 diabetes mellitus with other skin complications: Secondary | ICD-10-CM

## 2024-01-07 DIAGNOSIS — L089 Local infection of the skin and subcutaneous tissue, unspecified: Secondary | ICD-10-CM

## 2024-01-07 LAB — LACTIC ACID, PLASMA
Lactic Acid, Venous: 3.2 mmol/L (ref 0.5–1.9)
Lactic Acid, Venous: 1.5 mmol/L (ref 0.5–1.9)

## 2024-01-07 LAB — C-REACTIVE PROTEIN: CRP: 18.1 mg/dL — ABNORMAL HIGH (ref <1.0)

## 2024-01-07 LAB — HEPATITIS B SURFACE ANTIGEN: Hepatitis B Surface Ag: NONREACTIVE

## 2024-01-07 LAB — SEDIMENTATION RATE: Sed Rate: 45 mm/h — ABNORMAL HIGH (ref 0–16)

## 2024-01-07 MED ORDER — ISOSORBIDE MONONITRATE ER 60 MG PO TB24
60.0000 mg | ORAL_TABLET | Freq: Every day | ORAL | Status: DC
  Administered 2024-01-07 – 2024-01-12 (×6): 60 mg via ORAL
  Filled 2024-01-07 (×3): qty 1
  Filled 2024-01-07: qty 2
  Filled 2024-01-07 (×2): qty 1

## 2024-01-07 MED ORDER — ACETAMINOPHEN 650 MG RE SUPP: 650.0000 mg | Freq: Four times a day (QID) | RECTAL | Status: DC | PRN

## 2024-01-07 MED ORDER — DOXERCALCIFEROL 4 MCG/2ML IV SOLN
2.0000 ug | INTRAVENOUS | Status: DC
  Administered 2024-01-08 – 2024-01-23 (×5): 2 ug via INTRAVENOUS
  Filled 2024-01-07 (×12): qty 2

## 2024-01-07 MED ORDER — LINACLOTIDE 145 MCG PO CAPS: 145.0000 ug | ORAL_CAPSULE | ORAL | Status: DC | PRN

## 2024-01-07 MED ORDER — CARVEDILOL 25 MG PO TABS
25.0000 mg | ORAL_TABLET | Freq: Two times a day (BID) | ORAL | Status: DC
  Administered 2024-01-07 – 2024-01-12 (×10): 25 mg via ORAL
  Filled 2024-01-07 (×9): qty 1
  Filled 2024-01-07: qty 2

## 2024-01-07 MED ORDER — TAMSULOSIN HCL 0.4 MG PO CAPS
0.4000 mg | ORAL_CAPSULE | Freq: Every day | ORAL | Status: DC
  Administered 2024-01-07 – 2024-01-22 (×17): 0.4 mg via ORAL
  Filled 2024-01-07 (×17): qty 1

## 2024-01-07 MED ORDER — ONDANSETRON HCL 4 MG PO TABS: 4.0000 mg | ORAL_TABLET | Freq: Four times a day (QID) | ORAL | Status: DC | PRN

## 2024-01-07 MED ORDER — ASPIRIN 81 MG PO TBEC
81.0000 mg | DELAYED_RELEASE_TABLET | Freq: Every day | ORAL | Status: DC
  Administered 2024-01-07 – 2024-01-22 (×15): 81 mg via ORAL
  Filled 2024-01-07 (×15): qty 1

## 2024-01-07 MED ORDER — ACETAMINOPHEN 325 MG PO TABS
650.0000 mg | ORAL_TABLET | Freq: Four times a day (QID) | ORAL | Status: DC | PRN
  Administered 2024-01-07 – 2024-01-19 (×7): 650 mg via ORAL
  Filled 2024-01-07 (×8): qty 2

## 2024-01-07 MED ORDER — PROSOURCE PLUS PO LIQD
30.0000 mL | Freq: Two times a day (BID) | ORAL | Status: DC
  Administered 2024-01-08 – 2024-01-20 (×16): 30 mL via ORAL
  Filled 2024-01-07 (×18): qty 30

## 2024-01-07 MED ORDER — SEVELAMER CARBONATE 800 MG PO TABS
1600.0000 mg | ORAL_TABLET | Freq: Three times a day (TID) | ORAL | Status: DC
  Administered 2024-01-07 – 2024-01-23 (×33): 1600 mg via ORAL
  Filled 2024-01-07 (×35): qty 2

## 2024-01-07 MED ORDER — HEPARIN SODIUM (PORCINE) 5000 UNIT/ML IJ SOLN
5000.0000 [IU] | Freq: Three times a day (TID) | INTRAMUSCULAR | Status: DC
  Administered 2024-01-07 – 2024-01-16 (×22): 5000 [IU] via SUBCUTANEOUS
  Filled 2024-01-07 (×22): qty 1

## 2024-01-07 MED ORDER — CHLORHEXIDINE GLUCONATE CLOTH 2 % EX PADS
6.0000 | MEDICATED_PAD | Freq: Every day | CUTANEOUS | Status: DC
  Administered 2024-01-10: 6 via TOPICAL

## 2024-01-07 MED ORDER — FINASTERIDE 5 MG PO TABS
5.0000 mg | ORAL_TABLET | Freq: Every day | ORAL | Status: DC
  Administered 2024-01-07 – 2024-01-22 (×15): 5 mg via ORAL
  Filled 2024-01-07 (×15): qty 1

## 2024-01-07 MED ORDER — ONDANSETRON HCL 4 MG/2ML IJ SOLN: 4.0000 mg | Freq: Four times a day (QID) | INTRAMUSCULAR | Status: DC | PRN

## 2024-01-07 MED ORDER — PIPERACILLIN-TAZOBACTAM IN DEX 2-0.25 GM/50ML IV SOLN
2.2500 g | Freq: Three times a day (TID) | INTRAVENOUS | Status: AC
  Administered 2024-01-07 – 2024-01-16 (×27): 2.25 g via INTRAVENOUS
  Filled 2024-01-07 (×30): qty 50

## 2024-01-07 MED ORDER — ATORVASTATIN CALCIUM 80 MG PO TABS
80.0000 mg | ORAL_TABLET | Freq: Every day | ORAL | Status: DC
Start: 2024-01-07 — End: 2024-01-23
  Administered 2024-01-07 – 2024-01-22 (×15): 80 mg via ORAL
  Filled 2024-01-07 (×15): qty 1

## 2024-01-07 MED ORDER — LOPERAMIDE HCL 2 MG PO CAPS
2.0000 mg | ORAL_CAPSULE | ORAL | Status: DC | PRN
  Administered 2024-01-07 – 2024-01-19 (×13): 2 mg via ORAL
  Filled 2024-01-07 (×13): qty 1

## 2024-01-07 MED ORDER — LACTATED RINGERS IV BOLUS
1000.0000 mL | Freq: Once | INTRAVENOUS | Status: AC
  Administered 2024-01-07: 1000 mL via INTRAVENOUS
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
## 2024-01-07 NOTE — Sepsis Progress Note (Addendum)
 Elink following for sepsis protocol called out at 23:55, BC done at 1700 and have been ordered again, VSS, Abx's given at 23:48, Flds are being given
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
## 2024-01-07 NOTE — ED Notes (Signed)
 Phlebotomy stuck twice for blood work, will try again soon

## 2024-01-07 NOTE — Progress Notes (Signed)
 Pt receives out-pt HD at [facility] on MWF 10:45 am chair time. Will assist as needed.   Olivia Canter Renal Navigator [phone]

## 2024-01-07 NOTE — Progress Notes (Signed)
 PROGRESS NOTE    William Webb [facility]  ZOX:[id number] DOB: [birth year] DOA: 01/06/2024 PCP: Irven Coe, MD  Outpatient Specialists:     Brief Narrative:  Patient is a 73 year old male with past medical history significant for CHF, coronary artery disease, end-stage renal disease on hemodialysis (on Monday, Wednesday and Friday), hypertension and type 2 diabetes mellitus.  Patient has chronic wound on right foot.  Patient was admitted with increased drainage, not feeling unwell, with fever or chills.  Orthopedic team has been consulted to assist with management of chronic right heel wound, with associated peripheral vascular disease.  MRI of the foot is pending.  As per orthopedic team, patient may proceed with right BKA if osteomyelitis is revealed on the MRI.  Blood cultures are pending.  Patient is on IV antibiotics.  Patient was admitted earlier today.   Assessment & Plan:   Principal Problem:   Diabetic foot infection (HCC)   # Severe sepsis secondary to right diabetic foot wound # ESRD on hemodialysis-patient will need nephrology consult for dialysis in the morning # Chronic diarrhea-continue Imodium # CAD-Continue aspirin, imdur # Hyperlipidemia-continue Lipitor # Hypertension-continue carvedilol # BPH-continue finasteride, flomax    DVT prophylaxis: Subcutaneous heparin Code Status: Full code. Family Communication: Niece. Disposition Plan:    Consultants:  Nephrology. Orthopedic surgery.  Procedures:  None for now.  Antimicrobials:  IV Zosyn.   Subjective: No new complaints.  Objective: Vitals:   01/07/24 0615 01/07/24 0643 01/07/24 0838 01/07/24 0928  BP: (!) 164/80  (!) 151/112 (!) 148/82  Pulse: 84   87  Resp: 18  19 19   Temp:  99.5 F (37.5 C)    TempSrc:  Oral    SpO2: 95%   96%  Weight:        Intake/Output Summary (Last 24 hours) at 01/07/2024 0939 Last data filed at 01/07/2024 0516 Gross per 24 hour  Intake 3041.82 ml  Output --  Net  3041.82 ml   Filed Weights   01/06/24 2315  Weight: 87 kg    Examination:  General exam: Appears calm and comfortable  Respiratory system: Clear to auscultation.  Cardiovascular system: S1 & S2, soft systolic murmur with increased intensity of S2 component of the heart sound.   Gastrointestinal system: Abdomen is soft and nontender. Central nervous system: Awake and alert. Extremities:           Data Reviewed: I have personally reviewed following labs and imaging studies  CBC: Recent Labs  Lab 01/06/24 1656  WBC 13.8*  NEUTROABS 12.1*  HGB 12.5*  HCT 40.3  MCV 92.9  PLT 189   Basic Metabolic Panel: Recent Labs  Lab 01/06/24 1656  NA 140  K 3.5  CL 95*  CO2 27  GLUCOSE 96  BUN 15  CREATININE 4.32*  CALCIUM 8.6*   GFR: Estimated Creatinine Clearance: 17.9 mL/min (A) (by C-G formula based on SCr of 4.32 mg/dL (H)). Liver Function Tests: Recent Labs  Lab 01/06/24 1656  AST 17  ALT 7  ALKPHOS 81  BILITOT 1.4*  PROT 8.9*  ALBUMIN 3.0*   No results for input(s): "LIPASE", "AMYLASE" in the last 168 hours. No results for input(s): "AMMONIA" in the last 168 hours. Coagulation Profile: No results for input(s): "INR", "PROTIME" in the last 168 hours. Cardiac Enzymes: No results for input(s): "CKTOTAL", "CKMB", "CKMBINDEX", "TROPONINI" in the last 168 hours. BNP (last 3 results) No results for input(s): "PROBNP" in the last 8760 hours. HbA1C: No results for input(s): "HGBA1C" in the  last 72 hours. CBG: Recent Labs  Lab 01/06/24 1709  GLUCAP 92   Lipid Profile: No results for input(s): "CHOL", "HDL", "LDLCALC", "TRIG", "CHOLHDL", "LDLDIRECT" in the last 72 hours. Thyroid Function Tests: No results for input(s): "TSH", "T4TOTAL", "FREET4", "T3FREE", "THYROIDAB" in the last 72 hours. Anemia Panel: No results for input(s): "VITAMINB12", "FOLATE", "FERRITIN", "TIBC", "IRON", "RETICCTPCT" in the last 72 hours. Urine analysis:    Component Value  Date/Time   COLORURINE YELLOW 02/04/2021 0918   APPEARANCEUR CLEAR 02/04/2021 0918   LABSPEC 1.012 02/04/2021 0918   PHURINE 5.0 02/04/2021 0918   GLUCOSEU 50 (A) 02/04/2021 0918   HGBUR NEGATIVE 02/04/2021 0918   BILIRUBINUR NEGATIVE 02/04/2021 0918   KETONESUR NEGATIVE 02/04/2021 0918   PROTEINUR >=300 (A) 02/04/2021 0918   NITRITE NEGATIVE 02/04/2021 0918   LEUKOCYTESUR NEGATIVE 02/04/2021 0918   Sepsis Labs: @LABRCNTIP (procalcitonin:4,lacticidven:4)  ) Recent Results (from the past 240 hours)  Resp panel by RT-PCR (RSV, Flu A&B, Covid) Anterior Nasal Swab     Status: None   Collection Time: 01/06/24  4:57 PM   Specimen: Anterior Nasal Swab  Result Value Ref Range Status   SARS Coronavirus 2 by RT PCR NEGATIVE NEGATIVE Final   Influenza A by PCR NEGATIVE NEGATIVE Final   Influenza B by PCR NEGATIVE NEGATIVE Final    Comment: (NOTE) The Xpert Xpress SARS-CoV-2/FLU/RSV plus assay is intended as an aid in the diagnosis of influenza from Nasopharyngeal swab specimens and should not be used as a sole basis for treatment. Nasal washings and aspirates are unacceptable for Xpert Xpress SARS-CoV-2/FLU/RSV testing.  Fact Sheet for Patients: BloggerCourse.com  Fact Sheet for Healthcare Providers: SeriousBroker.it  This test is not yet approved or cleared by the [place] FDA and has been authorized for detection and/or diagnosis of SARS-CoV-2 by FDA under an Emergency Use Authorization (EUA). This EUA will remain in effect (meaning this test can be used) for the duration of the COVID-19 declaration under Section 564(b)(1) of the Act, 21 U.S.C. section 360bbb-3(b)(1), unless the authorization is terminated or revoked.     Resp Syncytial Virus by PCR NEGATIVE NEGATIVE Final    Comment: (NOTE) Fact Sheet for Patients: BloggerCourse.com  Fact Sheet for Healthcare  Providers: SeriousBroker.it  This test is not yet approved or cleared by the [place] FDA and has been authorized for detection and/or diagnosis of SARS-CoV-2 by FDA under an Emergency Use Authorization (EUA). This EUA will remain in effect (meaning this test can be used) for the duration of the COVID-19 declaration under Section 564(b)(1) of the Act, 21 U.S.C. section 360bbb-3(b)(1), unless the authorization is terminated or revoked.  Performed at [facility], [address], [place], [place] [address]   Blood Cultures x 2 sites     Status: None (Preliminary result)   Collection Time: 01/06/24  5:00 PM   Specimen: BLOOD  Result Value Ref Range Status   Specimen Description BLOOD RIGHT ANTECUBITAL  Final   Special Requests   Final    BOTTLES DRAWN AEROBIC AND ANAEROBIC Blood Culture results may not be optimal due to an inadequate volume of blood received in culture bottles   Culture   Final    NO GROWTH < 24 HOURS Performed at [facility], [address] [place], [place] [address]    Report Status PENDING  Incomplete  Blood Cultures x 2 sites     Status: None (Preliminary result)   Collection Time: 01/06/24  9:38 PM   Specimen: BLOOD  RIGHT HAND  Result Value Ref Range Status   Specimen Description BLOOD RIGHT HAND  Final   Special Requests   Final    BOTTLES DRAWN AEROBIC AND ANAEROBIC Blood Culture results may not be optimal due to an inadequate volume of blood received in culture bottles   Culture   Final    NO GROWTH < 12 HOURS Performed at [facility], [address] [place], [place] [address]    Report Status PENDING  Incomplete         Radiology Studies: DG Foot Complete Right Result Date: 01/06/2024 CLINICAL DATA:  Chronic foot wound. EXAM: RIGHT FOOT COMPLETE - 3+ VIEW COMPARISON:  10/20/2023 FINDINGS: In previous resection of the second toe. There is no obvious bony destructive or erosive change. No fracture.  Hammertoe deformity of the toes. Degenerative change of the first metatarsal phalangeal joint. A dressing overlies the heel. No obvious soft tissue gas radiopaque foreign body. Artifact from overlying sock. IMPRESSION: 1. Previous resection of the second toe. 2. A dressing overlies the heel. Evidence of calcaneal osteomyelitis. 3. Degenerative change of the first metatarsophalangeal joint. Electronically Signed   By: Narda Rutherford M.D.   On: 01/06/2024 20:25        Scheduled Meds:  aspirin EC  81 mg Oral Daily   atorvastatin  80 mg Oral Daily   carvedilol  25 mg Oral BID   finasteride  5 mg Oral Daily   heparin  5,000 Units Subcutaneous Q8H   isosorbide mononitrate  60 mg Oral Daily   sevelamer carbonate  1,600 mg Oral TID WC   tamsulosin  0.4 mg Oral QHS   Continuous Infusions:  lactated ringers     lactated ringers 75 mL/hr at 01/07/24 0245   piperacillin-tazobactam (ZOSYN)  IV Stopped (01/07/24 0209)     LOS: 0 days    Time spent: 55 minutes.    Berton Mount, MD  [facility] Pager #: [phone] 7PM-7AM contact night coverage as above

## 2024-01-07 NOTE — ED Notes (Addendum)
 Dr. Avie Arenas made aware of critical lactic
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
## 2024-01-07 NOTE — ED Notes (Signed)
 Unsuccessful lab work draw x 2 phelb - > mini lab to communicate with dayshift phleb to attempt draw Unable to pull labs off PIV x 2 attempts by this RN

## 2024-01-07 NOTE — Progress Notes (Signed)
 Pharmacy Antibiotic Note  Susana Gripp is a 73 y.o. male admitted on 01/06/2024 with sepsis. PMH significant for ESRD on HD MWF (last HD 3/10). Pharmacy has been consulted for zosyn dosing.  Plan: Zosyn 2.25g q8h  F/u infectious work up and length of therapy  Weight: 87 kg (191 lb 12.8 oz)  Temp (24hrs), Avg:99.6 F (37.6 C), Min:97.8 F (36.6 C), Max:101.5 F (38.6 C)  Recent Labs  Lab 01/06/24 1656 01/06/24 1714  WBC 13.8*  --   CREATININE 4.32*  --   LATICACIDVEN  --  2.0*    Estimated Creatinine Clearance: 17.9 mL/min (A) (by C-G formula based on SCr of 4.32 mg/dL (H)).    No Known Allergies  Thank you for allowing pharmacy to be a part of this patient's care.  Marja Kays 01/07/2024 12:20 AM

## 2024-01-08 ENCOUNTER — Observation Stay (HOSPITAL_COMMUNITY)

## 2024-01-08 DIAGNOSIS — L089 Local infection of the skin and subcutaneous tissue, unspecified: Secondary | ICD-10-CM | POA: Diagnosis not present

## 2024-01-08 DIAGNOSIS — E11628 Type 2 diabetes mellitus with other skin complications: Secondary | ICD-10-CM | POA: Diagnosis not present

## 2024-01-08 LAB — RENAL FUNCTION PANEL
Albumin: 2.2 g/dL — ABNORMAL LOW (ref 3.5–5.0)
Anion gap: 9 (ref 5–15)
BUN: 30 mg/dL — ABNORMAL HIGH (ref 8–23)
CO2: 28 mmol/L (ref 22–32)
Calcium: 7.9 mg/dL — ABNORMAL LOW (ref 8.9–10.3)
Chloride: 99 mmol/L (ref 98–111)
Creatinine, Ser: 6.27 mg/dL — ABNORMAL HIGH (ref 0.61–1.24)
GFR, Estimated: 9 mL/min — ABNORMAL LOW (ref >60)
Glucose, Bld: 95 mg/dL (ref 70–99)
Phosphorus: 4.1 mg/dL (ref 2.5–4.6)
Potassium: 3.3 mmol/L — ABNORMAL LOW (ref 3.5–5.1)
Sodium: 136 mmol/L (ref 135–145)

## 2024-01-08 LAB — GLUCOSE, CAPILLARY
Glucose-Capillary: 96 mg/dL (ref 70–99)
Glucose-Capillary: 102 mg/dL — ABNORMAL HIGH (ref 70–99)
Glucose-Capillary: 95 mg/dL (ref 70–99)

## 2024-01-08 LAB — HEPATITIS B SURFACE ANTIBODY, QUANTITATIVE: Hep B S AB Quant (Post): 2029 m[IU]/mL

## 2024-01-08 MED ORDER — INSULIN ASPART 100 UNIT/ML IJ SOLN
0.0000 [IU] | Freq: Every day | INTRAMUSCULAR | Status: DC
  Administered 2024-01-21: 2 [IU] via SUBCUTANEOUS

## 2024-01-08 MED ORDER — PENTAFLUOROPROP-TETRAFLUOROETH EX AERO: 1.0000 | INHALATION_SPRAY | CUTANEOUS | Status: DC | PRN

## 2024-01-08 MED ORDER — VANCOMYCIN HCL 750 MG/150ML IV SOLN
750.0000 mg | INTRAVENOUS | Status: AC
  Administered 2024-01-10 – 2024-01-15 (×3): 750 mg via INTRAVENOUS
  Filled 2024-01-08 (×6): qty 150

## 2024-01-08 MED ORDER — VANCOMYCIN HCL 1750 MG/350ML IV SOLN
1750.0000 mg | Freq: Once | INTRAVENOUS | Status: AC
  Administered 2024-01-08: 1750 mg via INTRAVENOUS
  Filled 2024-01-08: qty 350

## 2024-01-08 MED ORDER — INSULIN ASPART 100 UNIT/ML IJ SOLN
0.0000 [IU] | Freq: Three times a day (TID) | INTRAMUSCULAR | Status: DC
  Administered 2024-01-09: 1 [IU] via SUBCUTANEOUS
  Administered 2024-01-10: 3 [IU] via SUBCUTANEOUS
  Administered 2024-01-11: 2 [IU] via SUBCUTANEOUS
  Administered 2024-01-11: 7 [IU] via SUBCUTANEOUS
  Administered 2024-01-12: 3 [IU] via SUBCUTANEOUS
  Administered 2024-01-13 – 2024-01-16 (×5): 1 [IU] via SUBCUTANEOUS
  Administered 2024-01-16 – 2024-01-18 (×3): 2 [IU] via SUBCUTANEOUS
  Administered 2024-01-19 – 2024-01-20 (×5): 1 [IU] via SUBCUTANEOUS
  Administered 2024-01-21: 2 [IU] via SUBCUTANEOUS
  Administered 2024-01-22: 3 [IU] via SUBCUTANEOUS
  Administered 2024-01-22: 2 [IU] via SUBCUTANEOUS
  Administered 2024-01-22: 1 [IU] via SUBCUTANEOUS
  Administered 2024-01-23: 5 [IU] via SUBCUTANEOUS

## 2024-01-08 NOTE — Care Management Obs Status (Signed)
 MEDICARE OBSERVATION STATUS NOTIFICATION   Patient Details  Name: William Webb MRN: [id number] Date of Birth: Jan 05, 1951   Medicare Observation Status Notification Given:  Yes    Tom-Johnson, Hershal Coria, RN 01/08/2024, 4:17 PM

## 2024-01-08 NOTE — Plan of Care (Signed)

## 2024-01-08 NOTE — TOC CM/SW Note (Addendum)
 Transition of Care [facility]) - Inpatient Brief Assessment   Patient Details  Name: William Webb MRN: [id number] Date of Birth: Mar 23, 1951  Transition of Care [facility]) CM/SW Contact:    Tom-Johnson, Hershal Coria, RN Phone Number: 01/08/2024, 4:44 PM   Clinical Narrative:  Patient presented to the ED with Chronic Rt Foot infection and Fever. Foot X-ray shows concern for Osteomyelitis. On IV abx. Orthopedic following, no surgical intervention at this time, patient would like to salvage Limb. Has hx of Lt BKA and ESRD on MWF outpatient HD.   From home alone, has four children and a supportive family. Has necessary DME's at home.  PCP is Irven Coe, MD and uses [facility] on [address]   Active with [facility] for RN wound care.  Patient not Medically ready for discharge.  CM will continue to follow as patient progresses with care towards discharge.              Transition of Care Asessment: Insurance and Status: Insurance coverage has been reviewed Patient has primary care physician: Yes Home environment has been reviewed: Yes Prior level of function:: Modified Independent   Social Drivers of Health Review: SDOH reviewed no interventions necessary Readmission risk has been reviewed: Yes Transition of care needs: transition of care needs identified, TOC will continue to follow

## 2024-01-08 NOTE — Progress Notes (Signed)
  [facility] Progress Note   Subjective:   Completed dialysis with 2.5L removed.  No new complaints.   Objective Vitals:   01/08/24 1030 01/08/24 1100 01/08/24 1130 01/08/24 1200  BP: 138/72 (!) 110/57 109/60 122/66  Pulse: 76 73 74 74  Resp: 18 18 16 15   Temp:      TempSrc:      SpO2: 100% 100% 100% 100%  Weight:      Height:        Additional Objective Labs: Basic Metabolic Panel: Recent Labs  Lab 01/06/24 1656 01/08/24 0930  NA 140 136  K 3.5 3.3*  CL 95* 99  CO2 27 28  GLUCOSE 96 95  BUN 15 30*  CREATININE 4.32* 6.27*  CALCIUM 8.6* 7.9*  PHOS  --  4.1   CBC: Recent Labs  Lab 01/06/24 1656  WBC 13.8*  NEUTROABS 12.1*  HGB 12.5*  HCT 40.3  MCV 92.9  PLT 189   Blood Culture    Component Value Date/Time   SDES BLOOD RIGHT HAND 01/06/2024 2138   SPECREQUEST  01/06/2024 2138    BOTTLES DRAWN AEROBIC AND ANAEROBIC Blood Culture results may not be optimal due to an inadequate volume of blood received in culture bottles   CULT  01/06/2024 2138    NO GROWTH 2 DAYS Performed at [facility], [address] [place], [place] [address]    REPTSTATUS PENDING 01/06/2024 2138     Physical Exam General: Alert, nad  Heart: RRR Lungs: Clear, normal wob Abdomen: soft no masses  Extremities: L BKA, RLE wound  1+ edema  Dialysis Access: LUE AVF +bruit   Medications:  piperacillin-tazobactam (ZOSYN)  IV 2.25 g (01/08/24 0339)    (feeding supplement) PROSource Plus  30 mL Oral BID BM   aspirin EC  81 mg Oral Daily   atorvastatin  80 mg Oral Daily   carvedilol  25 mg Oral BID   Chlorhexidine Gluconate Cloth  6 each Topical Q0600   doxercalciferol  2 mcg Intravenous Q M,W,F-HD   finasteride  5 mg Oral Daily   heparin  5,000 Units Subcutaneous Q8H   insulin aspart  0-5 Units Subcutaneous QHS   insulin aspart  0-9 Units Subcutaneous TID WC   isosorbide mononitrate  60 mg Oral Daily   sevelamer carbonate  1,600 mg Oral TID WC    tamsulosin  0.4 mg Oral QHS    Dialysis Orders:  AF MWF 4:00 425/800 EDW 80.4kg 2K/2Ca AVF No heparin  Post HD wt 3/10 - 75.4 kg  -Mircera 225 q 2 weeks (last 3/3) -Hectorol 2 q HD   Assessment/Plan: Sepsis/R foot wound - possible oseto on MRI. On IV Vanc/Zosyn. Blood cultures ngtd. Orthopedics consulted - may require BKA   ESRD -  HD MWF. Continue on schedule. Next HD Wed   Hypertension/volume  - BP ok. Some volume on exam s/p IVFs. UF 2-2.5L. Follow weights    Anemia  - Hgb 11-12. No ESA needs.   Metabolic bone disease -  Calcium acceptable. Follow Phos. Continue home meds   Nutrition - Renal diet, fluid restriction. Prot supp for low albumin     Tomasa Blase PA-C [facility] 01/08/2024,12:11 PM

## 2024-01-08 NOTE — Progress Notes (Signed)
 Pharmacy Antibiotic Note  William Webb is a 73 y.o. male admitted on 01/06/2024 with  limb threatening infection .  Pharmacy has been consulted for vancomycin dosing.  Pt received vancomycin 1g IV x1 on 3/10. Has since received HD on 3/12 x3.5hrs  Plan: Vancomycin 1750mg  IV x1 then 750 mg after HD on MWF schedule -goal trough 15-82mcg/mL Levels PRN per protocol Continue zosyn 2.25g IV q8h   Height: 6\' 3"  (190.5 cm) Weight: 76.5 kg (168 lb 10.4 oz) IBW/kg (Calculated) : 84.5  Temp (24hrs), Avg:98.4 F (36.9 C), Min:98.1 F (36.7 C), Max:99.4 F (37.4 C)  Recent Labs  Lab 01/06/24 1656 01/06/24 1714 01/07/24 0224 01/07/24 0524 01/08/24 0930  WBC 13.8*  --   --   --   --   CREATININE 4.32*  --   --   --  6.27*  LATICACIDVEN  --  2.0* 3.2* 1.5  --     Estimated Creatinine Clearance: 11.2 mL/min (A) (by C-G formula based on SCr of 6.27 mg/dL (H)).    No Known Allergies  Antimicrobials this admission: Vanc x 1 on 3/10; 3/12> Zosyn 3/11 >>  Dose adjustments this admission:   Microbiology results: 3/10 RVP neg 3/10 BCX: NGx2  Thank you for allowing pharmacy to be a part of this patient's care. Calton Dach, PharmD, BCCCP Clinical Pharmacist 01/08/2024 6:07 PM

## 2024-01-08 NOTE — Procedures (Signed)
 I was present at this dialysis session. I have reviewed the session itself and made appropriate changes.   Filed Weights   01/06/24 2315 01/08/24 0820  Weight: 87 kg 79 kg    Recent Labs  Lab 01/08/24 0930  NA 136  K 3.3*  CL 99  CO2 28  GLUCOSE 95  BUN 30*  CREATININE 6.27*  CALCIUM 7.9*  PHOS 4.1    Recent Labs  Lab 01/06/24 1656  WBC 13.8*  NEUTROABS 12.1*  HGB 12.5*  HCT 40.3  MCV 92.9  PLT 189    Scheduled Meds:  (feeding supplement) PROSource Plus  30 mL Oral BID BM   aspirin EC  81 mg Oral Daily   atorvastatin  80 mg Oral Daily   carvedilol  25 mg Oral BID   Chlorhexidine Gluconate Cloth  6 each Topical Q0600   doxercalciferol  2 mcg Intravenous Q M,W,F-HD   finasteride  5 mg Oral Daily   heparin  5,000 Units Subcutaneous Q8H   insulin aspart  0-5 Units Subcutaneous QHS   insulin aspart  0-9 Units Subcutaneous TID WC   isosorbide mononitrate  60 mg Oral Daily   sevelamer carbonate  1,600 mg Oral TID WC   tamsulosin  0.4 mg Oral QHS   Continuous Infusions:  lactated ringers     piperacillin-tazobactam (ZOSYN)  IV 2.25 g (01/08/24 0339)   PRN Meds:.acetaminophen **OR** acetaminophen, linaclotide, loperamide, ondansetron **OR** ondansetron (ZOFRAN) IV, pentafluoroprop-tetrafluoroeth   Louie Bun,  MD 01/08/2024, 10:37 AM

## 2024-01-08 NOTE — Progress Notes (Addendum)
 William Webb [facility]  WUJ:[id number] DOB: [birth year] DOA: 01/06/2024 PCP: Irven Coe, MD    Brief Narrative:  73 year old with a history of CHF, CAD, ESRD on HD, HTN, HLD, and DM2 who presented to the ER 3/10 with 2 to 3 days of fever and generalized weakness.  He has a chronic wound on his right foot and he had noted increased drainage from this wound.  In the ER an x-ray of his right foot was concerning for osteomyelitis.  Goals of Care:   Code Status: Full Code   DVT prophylaxis: heparin injection 5,000 Units Start: 01/07/24 0600 SCDs Start: 01/07/24 0018   Interim Hx: Afebrile.  Vital signs stable.  Resting comfortably in bed.  Denies any new complaints.  Assessment & Plan:  Right diabetic foot infection -right heel ulceration Orthopedics evaluated and is concerned patient may require right BKA -patient/family not yet willing to pursue amputation -plan is to continue empiric broad antibiotic therapy until Dr. Lajoyce Corners (who has been managing him as an outpatient) can evaluate the patient  ESRD on HD MWF Nephrology attending to inpatient dialysis -tolerating without complications  CAD Continue usual aspirin and Imdur -asymptomatic  HLD Continue usual Lipitor dose  HTN Continue usual Coreg dose -blood pressure presently well-controlled  BPH Continue finasteride and Flomax   Family Communication: No family present at time of exam Disposition: Unclear at this time   Objective: Blood pressure 132/60, pulse 79, temperature 98.3 F (36.8 C), resp. rate 18, height 6\' 3"  (1.905 m), weight 76.5 kg, SpO2 94%.  Intake/Output Summary (Last 24 hours) at 01/08/2024 1804 Last data filed at 01/08/2024 1229 Gross per 24 hour  Intake --  Output 2500 ml  Net -2500 ml   Filed Weights   01/08/24 0820 01/08/24 1228 01/08/24 1229  Weight: 79 kg 76.5 kg 76.5 kg    Examination: General: No acute respiratory distress Lungs: Clear to auscultation bilaterally without wheezes or  crackles Cardiovascular: Regular rate and rhythm without murmur gallop or rub normal S1 and S2 Abdomen: Nontender, nondistended, soft, bowel sounds positive, no rebound, no ascites, no appreciable mass Extremities: No significant cyanosis, clubbing, or edema bilateral lower extremities  CBC: Recent Labs  Lab 01/06/24 1656  WBC 13.8*  NEUTROABS 12.1*  HGB 12.5*  HCT 40.3  MCV 92.9  PLT 189   Basic Metabolic Panel: Recent Labs  Lab 01/06/24 1656 01/08/24 0930  NA 140 136  K 3.5 3.3*  CL 95* 99  CO2 27 28  GLUCOSE 96 95  BUN 15 30*  CREATININE 4.32* 6.27*  CALCIUM 8.6* 7.9*  PHOS  --  4.1   GFR: Estimated Creatinine Clearance: 11.2 mL/min (A) (by C-G formula based on SCr of 6.27 mg/dL (H)).   Scheduled Meds:  (feeding supplement) PROSource Plus  30 mL Oral BID BM   aspirin EC  81 mg Oral Daily   atorvastatin  80 mg Oral Daily   carvedilol  25 mg Oral BID   Chlorhexidine Gluconate Cloth  6 each Topical Q0600   doxercalciferol  2 mcg Intravenous Q M,W,F-HD   finasteride  5 mg Oral Daily   heparin  5,000 Units Subcutaneous Q8H   insulin aspart  0-5 Units Subcutaneous QHS   insulin aspart  0-9 Units Subcutaneous TID WC   isosorbide mononitrate  60 mg Oral Daily   sevelamer carbonate  1,600 mg Oral TID WC   tamsulosin  0.4 mg Oral QHS   Continuous Infusions:  piperacillin-tazobactam (ZOSYN)  IV 2.25 g (01/08/24  0454)     LOS: 0 days   Lonia Blood, MD [facility] Office  [phone] Pager - Text Page per Amion  If 7PM-7AM, please contact night-coverage per Amion 01/08/2024, 6:04 PM

## 2024-01-09 ENCOUNTER — Observation Stay (HOSPITAL_COMMUNITY)

## 2024-01-09 DIAGNOSIS — I2510 Atherosclerotic heart disease of native coronary artery without angina pectoris: Secondary | ICD-10-CM | POA: Diagnosis not present

## 2024-01-09 DIAGNOSIS — E1122 Type 2 diabetes mellitus with diabetic chronic kidney disease: Secondary | ICD-10-CM | POA: Diagnosis present

## 2024-01-09 DIAGNOSIS — L97419 Non-pressure chronic ulcer of right heel and midfoot with unspecified severity: Secondary | ICD-10-CM | POA: Diagnosis present

## 2024-01-09 DIAGNOSIS — M6281 Muscle weakness (generalized): Secondary | ICD-10-CM | POA: Diagnosis not present

## 2024-01-09 DIAGNOSIS — I70202 Unspecified atherosclerosis of native arteries of extremities, left leg: Secondary | ICD-10-CM | POA: Diagnosis not present

## 2024-01-09 DIAGNOSIS — E43 Unspecified severe protein-calorie malnutrition: Secondary | ICD-10-CM | POA: Diagnosis not present

## 2024-01-09 DIAGNOSIS — I3139 Other pericardial effusion (noninflammatory): Secondary | ICD-10-CM | POA: Diagnosis not present

## 2024-01-09 DIAGNOSIS — Z89511 Acquired absence of right leg below knee: Secondary | ICD-10-CM | POA: Diagnosis not present

## 2024-01-09 DIAGNOSIS — Z8551 Personal history of malignant neoplasm of bladder: Secondary | ICD-10-CM | POA: Diagnosis not present

## 2024-01-09 DIAGNOSIS — E11621 Type 2 diabetes mellitus with foot ulcer: Secondary | ICD-10-CM | POA: Diagnosis present

## 2024-01-09 DIAGNOSIS — Z4781 Encounter for orthopedic aftercare following surgical amputation: Secondary | ICD-10-CM | POA: Diagnosis not present

## 2024-01-09 DIAGNOSIS — I871 Compression of vein: Secondary | ICD-10-CM | POA: Diagnosis present

## 2024-01-09 DIAGNOSIS — M797 Fibromyalgia: Secondary | ICD-10-CM | POA: Diagnosis not present

## 2024-01-09 DIAGNOSIS — L97414 Non-pressure chronic ulcer of right heel and midfoot with necrosis of bone: Secondary | ICD-10-CM | POA: Diagnosis not present

## 2024-01-09 DIAGNOSIS — E1142 Type 2 diabetes mellitus with diabetic polyneuropathy: Secondary | ICD-10-CM | POA: Diagnosis present

## 2024-01-09 DIAGNOSIS — L089 Local infection of the skin and subcutaneous tissue, unspecified: Secondary | ICD-10-CM | POA: Diagnosis not present

## 2024-01-09 DIAGNOSIS — Y712 Prosthetic and other implants, materials and accessory cardiovascular devices associated with adverse incidents: Secondary | ICD-10-CM | POA: Diagnosis not present

## 2024-01-09 DIAGNOSIS — N250 Renal osteodystrophy: Secondary | ICD-10-CM | POA: Diagnosis not present

## 2024-01-09 DIAGNOSIS — G894 Chronic pain syndrome: Secondary | ICD-10-CM | POA: Diagnosis not present

## 2024-01-09 DIAGNOSIS — M6289 Other specified disorders of muscle: Secondary | ICD-10-CM | POA: Diagnosis not present

## 2024-01-09 DIAGNOSIS — M86172 Other acute osteomyelitis, left ankle and foot: Secondary | ICD-10-CM | POA: Diagnosis not present

## 2024-01-09 DIAGNOSIS — E785 Hyperlipidemia, unspecified: Secondary | ICD-10-CM | POA: Diagnosis not present

## 2024-01-09 DIAGNOSIS — M869 Osteomyelitis, unspecified: Secondary | ICD-10-CM | POA: Diagnosis present

## 2024-01-09 DIAGNOSIS — A419 Sepsis, unspecified organism: Secondary | ICD-10-CM | POA: Diagnosis present

## 2024-01-09 DIAGNOSIS — Z7401 Bed confinement status: Secondary | ICD-10-CM | POA: Diagnosis not present

## 2024-01-09 DIAGNOSIS — Z1152 Encounter for screening for COVID-19: Secondary | ICD-10-CM | POA: Diagnosis not present

## 2024-01-09 DIAGNOSIS — E113512 Type 2 diabetes mellitus with proliferative diabetic retinopathy with macular edema, left eye: Secondary | ICD-10-CM | POA: Diagnosis not present

## 2024-01-09 DIAGNOSIS — N2581 Secondary hyperparathyroidism of renal origin: Secondary | ICD-10-CM | POA: Diagnosis present

## 2024-01-09 DIAGNOSIS — I509 Heart failure, unspecified: Secondary | ICD-10-CM | POA: Diagnosis not present

## 2024-01-09 DIAGNOSIS — R6520 Severe sepsis without septic shock: Secondary | ICD-10-CM | POA: Diagnosis present

## 2024-01-09 DIAGNOSIS — I5032 Chronic diastolic (congestive) heart failure: Secondary | ICD-10-CM | POA: Diagnosis present

## 2024-01-09 DIAGNOSIS — I480 Paroxysmal atrial fibrillation: Secondary | ICD-10-CM | POA: Diagnosis not present

## 2024-01-09 DIAGNOSIS — E1169 Type 2 diabetes mellitus with other specified complication: Secondary | ICD-10-CM | POA: Diagnosis present

## 2024-01-09 DIAGNOSIS — I130 Hypertensive heart and chronic kidney disease with heart failure and stage 1 through stage 4 chronic kidney disease, or unspecified chronic kidney disease: Secondary | ICD-10-CM | POA: Diagnosis not present

## 2024-01-09 DIAGNOSIS — E11628 Type 2 diabetes mellitus with other skin complications: Secondary | ICD-10-CM | POA: Diagnosis not present

## 2024-01-09 DIAGNOSIS — I120 Hypertensive chronic kidney disease with stage 5 chronic kidney disease or end stage renal disease: Secondary | ICD-10-CM | POA: Diagnosis not present

## 2024-01-09 DIAGNOSIS — R531 Weakness: Secondary | ICD-10-CM | POA: Diagnosis not present

## 2024-01-09 DIAGNOSIS — I132 Hypertensive heart and chronic kidney disease with heart failure and with stage 5 chronic kidney disease, or end stage renal disease: Secondary | ICD-10-CM | POA: Diagnosis present

## 2024-01-09 DIAGNOSIS — Z794 Long term (current) use of insulin: Secondary | ICD-10-CM | POA: Diagnosis not present

## 2024-01-09 DIAGNOSIS — M810 Age-related osteoporosis without current pathological fracture: Secondary | ICD-10-CM | POA: Diagnosis not present

## 2024-01-09 DIAGNOSIS — Z743 Need for continuous supervision: Secondary | ICD-10-CM | POA: Diagnosis not present

## 2024-01-09 DIAGNOSIS — Z89611 Acquired absence of right leg above knee: Secondary | ICD-10-CM | POA: Diagnosis not present

## 2024-01-09 DIAGNOSIS — E113411 Type 2 diabetes mellitus with severe nonproliferative diabetic retinopathy with macular edema, right eye: Secondary | ICD-10-CM | POA: Diagnosis not present

## 2024-01-09 DIAGNOSIS — E1152 Type 2 diabetes mellitus with diabetic peripheral angiopathy with gangrene: Secondary | ICD-10-CM | POA: Diagnosis present

## 2024-01-09 DIAGNOSIS — E1159 Type 2 diabetes mellitus with other circulatory complications: Secondary | ICD-10-CM | POA: Diagnosis not present

## 2024-01-09 DIAGNOSIS — E871 Hypo-osmolality and hyponatremia: Secondary | ICD-10-CM | POA: Diagnosis not present

## 2024-01-09 DIAGNOSIS — I10 Essential (primary) hypertension: Secondary | ICD-10-CM | POA: Diagnosis not present

## 2024-01-09 DIAGNOSIS — D638 Anemia in other chronic diseases classified elsewhere: Secondary | ICD-10-CM | POA: Diagnosis not present

## 2024-01-09 DIAGNOSIS — T82838A Hemorrhage of vascular prosthetic devices, implants and grafts, initial encounter: Secondary | ICD-10-CM | POA: Diagnosis not present

## 2024-01-09 DIAGNOSIS — N186 End stage renal disease: Secondary | ICD-10-CM | POA: Diagnosis present

## 2024-01-09 DIAGNOSIS — I4819 Other persistent atrial fibrillation: Secondary | ICD-10-CM | POA: Diagnosis present

## 2024-01-09 DIAGNOSIS — E8809 Other disorders of plasma-protein metabolism, not elsewhere classified: Secondary | ICD-10-CM | POA: Diagnosis not present

## 2024-01-09 DIAGNOSIS — E11649 Type 2 diabetes mellitus with hypoglycemia without coma: Secondary | ICD-10-CM | POA: Diagnosis not present

## 2024-01-09 DIAGNOSIS — L97529 Non-pressure chronic ulcer of other part of left foot with unspecified severity: Secondary | ICD-10-CM | POA: Diagnosis not present

## 2024-01-09 DIAGNOSIS — M159 Polyosteoarthritis, unspecified: Secondary | ICD-10-CM | POA: Diagnosis not present

## 2024-01-09 DIAGNOSIS — K521 Toxic gastroenteritis and colitis: Secondary | ICD-10-CM | POA: Diagnosis not present

## 2024-01-09 DIAGNOSIS — I96 Gangrene, not elsewhere classified: Secondary | ICD-10-CM | POA: Diagnosis not present

## 2024-01-09 DIAGNOSIS — Z992 Dependence on renal dialysis: Secondary | ICD-10-CM | POA: Diagnosis not present

## 2024-01-09 DIAGNOSIS — L89152 Pressure ulcer of sacral region, stage 2: Secondary | ICD-10-CM | POA: Diagnosis present

## 2024-01-09 DIAGNOSIS — I739 Peripheral vascular disease, unspecified: Secondary | ICD-10-CM | POA: Diagnosis not present

## 2024-01-09 DIAGNOSIS — Z89512 Acquired absence of left leg below knee: Secondary | ICD-10-CM | POA: Diagnosis not present

## 2024-01-09 DIAGNOSIS — R2689 Other abnormalities of gait and mobility: Secondary | ICD-10-CM | POA: Diagnosis not present

## 2024-01-09 DIAGNOSIS — D631 Anemia in chronic kidney disease: Secondary | ICD-10-CM | POA: Diagnosis present

## 2024-01-09 LAB — RENAL FUNCTION PANEL
Albumin: 2.3 g/dL — ABNORMAL LOW (ref 3.5–5.0)
Anion gap: 12 (ref 5–15)
BUN: 20 mg/dL (ref 8–23)
CO2: 27 mmol/L (ref 22–32)
Calcium: 8.3 mg/dL — ABNORMAL LOW (ref 8.9–10.3)
Chloride: 96 mmol/L — ABNORMAL LOW (ref 98–111)
Creatinine, Ser: 4.51 mg/dL — ABNORMAL HIGH (ref 0.61–1.24)
GFR, Estimated: 13 mL/min — ABNORMAL LOW (ref >60)
Glucose, Bld: 90 mg/dL (ref 70–99)
Phosphorus: 3.6 mg/dL (ref 2.5–4.6)
Potassium: 3.5 mmol/L (ref 3.5–5.1)
Sodium: 135 mmol/L (ref 135–145)

## 2024-01-09 LAB — GLUCOSE, CAPILLARY
Glucose-Capillary: 90 mg/dL (ref 70–99)
Glucose-Capillary: 84 mg/dL (ref 70–99)
Glucose-Capillary: 147 mg/dL — ABNORMAL HIGH (ref 70–99)
Glucose-Capillary: 111 mg/dL — ABNORMAL HIGH (ref 70–99)

## 2024-01-09 LAB — CBC
HCT: 32.5 % — ABNORMAL LOW (ref 39.0–52.0)
Hemoglobin: 10.2 g/dL — ABNORMAL LOW (ref 13.0–17.0)
MCH: 28.7 pg (ref 26.0–34.0)
MCHC: 31.4 g/dL (ref 30.0–36.0)
MCV: 91.3 fL (ref 80.0–100.0)
Platelets: 158 10*3/uL (ref 150–400)
RBC: 3.56 MIL/uL — ABNORMAL LOW (ref 4.22–5.81)
RDW: 15.5 % (ref 11.5–15.5)
WBC: 10.9 10*3/uL — ABNORMAL HIGH (ref 4.0–10.5)
nRBC: 0 % (ref 0.0–0.2)

## 2024-01-09 LAB — MAGNESIUM: Magnesium: 1.9 mg/dL (ref 1.7–2.4)

## 2024-01-09 LAB — MRSA NEXT GEN BY PCR, NASAL: MRSA by PCR Next Gen: DETECTED — AB

## 2024-01-09 MED ORDER — GABAPENTIN 300 MG PO CAPS
300.0000 mg | ORAL_CAPSULE | Freq: Two times a day (BID) | ORAL | Status: DC
  Administered 2024-01-09 – 2024-01-22 (×25): 300 mg via ORAL
  Filled 2024-01-09 (×25): qty 1

## 2024-01-09 MED ORDER — CAMPHOR-MENTHOL 0.5-0.5 % EX LOTN
TOPICAL_LOTION | Freq: Every day | CUTANEOUS | Status: AC | PRN
  Filled 2024-01-09: qty 222

## 2024-01-09 MED ORDER — SACCHAROMYCES BOULARDII 250 MG PO CAPS
250.0000 mg | ORAL_CAPSULE | Freq: Two times a day (BID) | ORAL | Status: DC
  Administered 2024-01-09 – 2024-01-22 (×24): 250 mg via ORAL
  Filled 2024-01-09 (×24): qty 1

## 2024-01-09 NOTE — Evaluation (Signed)
 Occupational Therapy Evaluation Patient Details Name: William Webb [facility] MRN: [id number] DOB: [birth year] Today's Date: 01/09/2024   History of Present Illness   Pt is a 73 y/o male presenting on 3/10 with chronic R foot wound, lethargy. 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  PMH includes: ESRD HD MWF, DM2, HTN, L BKA.     Clinical Impressions Pt admitted for above and limited by problem list below.  PTA patient was independent with ADLs, ambulating with prosthetic vs WC, and reports using transportation services to dialysis but does drive.  Questionable historian due to lethargy this am, pt requires pretty constant tactile cueing to maintain alertness and engage with therapist and at times does not answer questions.  Improved alertness at EOB, pt oriented x 4.   Pt lives alone, and has intermittent support from family. Today, patient requires mod assist with bed mobility, min to total assist with ADLs, and declined further mobility at this time.  Recommend PT consult. Based on performance today, believe patient will best benefit from continued OT services acutely and after dc at inpatient setting with <3hrs/day to optimize independence, safety with ADLs and mobility.       If plan is discharge home, recommend the following:   A lot of help with walking and/or transfers;A lot of help with bathing/dressing/bathroom;Direct supervision/assist for medications management;Direct supervision/assist for financial management;Assistance with cooking/housework;Assist for transportation;Help with stairs or ramp for entrance     Functional Status Assessment   Patient has had a recent decline in their functional status and demonstrates the ability to make significant improvements in function in a reasonable and predictable amount of time.     Equipment Recommendations   Other (comment) (defer)     Recommendations for Other Services   PT consult     Precautions/Restrictions    Precautions Precautions: Fall Precaution/Restrictions Comments: chronic L BKA, R heel wound Restrictions Weight Bearing Restrictions Per Provider Order: No     Mobility Bed Mobility Overal bed mobility: Needs Assistance Bed Mobility: Supine to Sit, Sit to Supine     Supine to sit: Mod assist Sit to supine: Mod assist   General bed mobility comments: assist for initation, R LE and trunk    Transfers                   General transfer comment: deferred as pt declined      Balance Overall balance assessment: Needs assistance Sitting-balance support: No upper extremity supported, Feet supported Sitting balance-Leahy Scale: Fair Sitting balance - Comments: EOB, min guard to close supervision                                   ADL either performed or assessed with clinical judgement   ADL Overall ADL's : Needs assistance/impaired     Grooming: Sitting;Contact guard assist           Upper Body Dressing : Contact guard assist;Sitting   Lower Body Dressing: Maximal assistance;Sitting/lateral leans     Toilet Transfer Details (indicate cue type and reason): deferred Toileting- Clothing Manipulation and Hygiene: Total assistance;Bed level       Functional mobility during ADLs: Moderate assistance       Vision   Vision Assessment?: No apparent visual deficits     Perception         Praxis         Pertinent Vitals/Pain Pain Assessment Pain Assessment: Faces Faces  Pain Scale: Hurts even more Pain Location: R foot Pain Descriptors / Indicators: Discomfort, Guarding Pain Intervention(s): Limited activity within patient's tolerance, Monitored during session, Repositioned     Extremity/Trunk Assessment Upper Extremity Assessment Upper Extremity Assessment: Generalized weakness   Lower Extremity Assessment Lower Extremity Assessment: Defer to PT evaluation (L BKA, R wound)       Communication Communication Communication: No  apparent difficulties Factors Affecting Communication:  (soft spoken)   Cognition Arousal: Obtunded Behavior During Therapy: Flat affect Cognition: Cognition impaired     Awareness: Intellectual awareness intact, Online awareness impaired Memory impairment (select all impairments): Short-term memory, Working memory Attention impairment (select first level of impairment): Focused attention Executive functioning impairment (select all impairments): Initiation, Problem solving, Sequencing, Organization OT - Cognition Comments: patient initally unable to voice month but after sitting up able to state correctly, he is very inconsistent with history and has a difficult time answering questions about mobility.  He fades in and out with questions, frequently requiring tactile cueing to opens eyes again. Pt incontinent of bowel, but reports this is normal for him.                 Following commands: Impaired Following commands impaired: Follows one step commands inconsistently     Cueing  General Comments   Cueing Techniques: Verbal cues;Tactile cues  R foot wrapped, floated foot on pillow at end of session   Exercises     Shoulder Instructions      Home Living Family/patient expects to be discharged to:: Private residence Living Arrangements: Alone Available Help at Discharge: Family;Available PRN/intermittently Type of Home: Apartment Home Access: Stairs to enter Entrance Stairs-Number of Steps: 2 Entrance Stairs-Rails: Right;Left;Can reach both Home Layout: One level     Bathroom Shower/Tub: Chief Strategy Officer: Standard     Home Equipment: Agricultural consultant (2 wheels);Wheelchair - manual;Cane - single point;BSC/3in1;Grab bars - tub/shower;Tub bench          Prior Functioning/Environment Prior Level of Function : Independent/Modified Independent;Driving;Patient poor historian/Family not available             Mobility Comments: unclear, prostheic  vs WC vs RW. poor historian. ADLs Comments: reports using prosthetic, using RW vs WC but poor historian; lives alone so anticipate independent reports using transportation to HD    OT Problem List: Decreased strength;Decreased activity tolerance;Impaired balance (sitting and/or standing);Pain;Decreased knowledge of precautions;Decreased knowledge of use of DME or AE;Decreased safety awareness;Decreased cognition   OT Treatment/Interventions: Self-care/ADL training;Therapeutic exercise;DME and/or AE instruction;Therapeutic activities;Balance training;Patient/family education;Cognitive remediation/compensation      OT Goals(Current goals can be found in the care plan section)   Acute Rehab OT Goals Patient Stated Goal: less pain, feel better OT Goal Formulation: With patient Time For Goal Achievement: 01/23/24 Potential to Achieve Goals: Good   OT Frequency:  Min 2X/week    Co-evaluation              AM-PAC OT "6 Clicks" Daily Activity     Outcome Measure Help from another person eating meals?: A Little Help from another person taking care of personal grooming?: A Little Help from another person toileting, which includes using toliet, bedpan, or urinal?: Total Help from another person bathing (including washing, rinsing, drying)?: A Lot Help from another person to put on and taking off regular upper body clothing?: A Little Help from another person to put on and taking off regular lower body clothing?: A Lot 6 Click Score: 14   End  of Session Nurse Communication: Mobility status;Other (comment) (incontient bowel)  Activity Tolerance: Patient tolerated treatment well Patient left: in bed;with call bell/phone within reach;with bed alarm set  OT Visit Diagnosis: Other abnormalities of gait and mobility (R26.89);Muscle weakness (generalized) (M62.81);Pain;Other symptoms and signs involving cognitive function Pain - Right/Left: Right Pain - part of body: Leg;Ankle and joints of  foot                Time: 0454-0981 OT Time Calculation (min): 25 min Charges:  OT General Charges $OT Visit: 1 Visit OT Evaluation $OT Eval Moderate Complexity: 1 Mod OT Treatments $Self Care/Home Management : 8-22 mins  Barry Brunner, OT Acute Rehabilitation Services Office [phone] Secure Chat Preferred    Chancy Milroy 01/09/2024, 11:47 AM

## 2024-01-09 NOTE — Progress Notes (Signed)
 William Webb [facility]  ZOX:[id number] DOB: Aug 16, 1951 DOA: 01/06/2024 PCP: Irven Coe, MD    Brief Narrative:  73 year old with a history of CHF, CAD, ESRD on HD, HTN, HLD, and DM2 who presented to the ER 3/10 with 2 to 3 days of fever and generalized weakness.  He has a chronic wound on his right foot and he had noted increased drainage from this wound.  In the ER an x-ray of his right foot was concerning for osteomyelitis.  Goals of Care:   Code Status: Full Code   DVT prophylaxis: heparin injection 5,000 Units Start: 01/07/24 0600 SCDs Start: 01/07/24 0018   Interim Hx: No acute events reported overnight.  Afebrile.  Vital signs stable.  Resting comfortably in bed at the time of visit.  Has now developed some watery clearish greenish stool without fever or abdominal pain.  Assessment & Plan:  Right diabetic foot infection -right heel ulceration Orthopedics evaluated and is concerned patient may require right BKA - patient/family not yet willing to pursue amputation - plan is to continue empiric broad antibiotic therapy until Dr. Lajoyce Corners (who has been managing him as an outpatient) can evaluate the patient  ESRD on HD MWF Nephrology attending to inpatient dialysis -tolerating without complications  Antibiotic associated diarrhea No clinical symptoms to suggest C. difficile -initiate probiotic and as needed Imodium  CAD Continue usual aspirin and Imdur -asymptomatic  HLD Continue usual Lipitor dose  HTN Continue usual Coreg dose -blood pressure presently well-controlled  BPH Continue finasteride and Flomax   Family Communication: No family present at time of exam Disposition: Unclear at this time   Objective: Blood pressure (!) 135/59, pulse 76, temperature 100 F (37.8 C), temperature source Oral, resp. rate 18, height 6\' 3"  (1.905 m), weight 75.4 kg, SpO2 98%.  Intake/Output Summary (Last 24 hours) at 01/09/2024 1124 Last data filed at 01/09/2024 0830 Gross per 24  hour  Intake 250 ml  Output 2500 ml  Net -2250 ml   Filed Weights   01/08/24 1228 01/08/24 1229 01/09/24 0313  Weight: 76.5 kg 76.5 kg 75.4 kg    Examination: General: No acute respiratory distress Lungs: Clear to auscultation bilaterally without wheezes or crackles Cardiovascular: Regular rate and rhythm without murmur gallop or rub normal S1 and S2 Abdomen: NT/ND, soft, BS positive, no rebound Extremities: No significant cyanosis, clubbing, or edema bilateral lower extremities  CBC: Recent Labs  Lab 01/06/24 1656 01/09/24 0427  WBC 13.8* 10.9*  NEUTROABS 12.1*  --   HGB 12.5* 10.2*  HCT 40.3 32.5*  MCV 92.9 91.3  PLT 189 158   Basic Metabolic Panel: Recent Labs  Lab 01/06/24 1656 01/08/24 0930 01/09/24 0427  NA 140 136 135  K 3.5 3.3* 3.5  CL 95* 99 96*  CO2 27 28 27   GLUCOSE 96 95 90  BUN 15 30* 20  CREATININE 4.32* 6.27* 4.51*  CALCIUM 8.6* 7.9* 8.3*  MG  --   --  1.9  PHOS  --  4.1 3.6   GFR: Estimated Creatinine Clearance: 15.3 mL/min (A) (by C-G formula based on SCr of 4.51 mg/dL (H)).   Scheduled Meds:  (feeding supplement) PROSource Plus  30 mL Oral BID BM   aspirin EC  81 mg Oral Daily   atorvastatin  80 mg Oral Daily   carvedilol  25 mg Oral BID   Chlorhexidine Gluconate Cloth  6 each Topical Q0600   doxercalciferol  2 mcg Intravenous Q M,W,F-HD   finasteride  5 mg Oral  Daily   gabapentin  300 mg Oral BID   heparin  5,000 Units Subcutaneous Q8H   insulin aspart  0-5 Units Subcutaneous QHS   insulin aspart  0-9 Units Subcutaneous TID WC   isosorbide mononitrate  60 mg Oral Daily   sevelamer carbonate  1,600 mg Oral TID WC   tamsulosin  0.4 mg Oral QHS   Continuous Infusions:  piperacillin-tazobactam (ZOSYN)  IV 2.25 g (01/09/24 0929)   [START ON 01/10/2024] vancomycin       LOS: 0 days   Lonia Blood, MD [facility] Office  [phone] Pager - Text Page per Loretha Stapler  If 7PM-7AM, please contact night-coverage per  Amion 01/09/2024, 11:24 AM

## 2024-01-09 NOTE — Progress Notes (Signed)
 [facility] Progress Note   Subjective:   No issues with dialysis yesterday No complaints this am, working with OT   Objective Vitals:   01/08/24 2035 01/09/24 0313 01/09/24 0656 01/09/24 0922  BP: 129/73  (!) 146/61 (!) 135/59  Pulse: 74  76   Resp: 18  18   Temp: 98.4 F (36.9 C)  100 F (37.8 C)   TempSrc:   Oral   SpO2: 97%  98%   Weight:  75.4 kg    Height:        Additional Objective Labs: Basic Metabolic Panel: Recent Labs  Lab 01/06/24 1656 01/08/24 0930 01/09/24 0427  NA 140 136 135  K 3.5 3.3* 3.5  CL 95* 99 96*  CO2 27 28 27   GLUCOSE 96 95 90  BUN 15 30* 20  CREATININE 4.32* 6.27* 4.51*  CALCIUM 8.6* 7.9* 8.3*  PHOS  --  4.1 3.6   CBC: Recent Labs  Lab 01/06/24 1656 01/09/24 0427  WBC 13.8* 10.9*  NEUTROABS 12.1*  --   HGB 12.5* 10.2*  HCT 40.3 32.5*  MCV 92.9 91.3  PLT 189 158   Blood Culture    Component Value Date/Time   SDES BLOOD RIGHT HAND 01/06/2024 2138   SPECREQUEST  01/06/2024 2138    BOTTLES DRAWN AEROBIC AND ANAEROBIC Blood Culture results may not be optimal due to an inadequate volume of blood received in culture bottles   CULT  01/06/2024 2138    NO GROWTH 3 DAYS Performed at [facility], [address], [place], [place] [address]    REPTSTATUS PENDING 01/06/2024 2138   Physical Exam General: Alert, nad  Heart: RRR Lungs: Clear, normal wob Abdomen: soft no masses  Extremities: L BKA, RLE wound  1+ edema  Dialysis Access: LUE AVF +bruit   Medications:  piperacillin-tazobactam (ZOSYN)  IV 2.25 g (01/09/24 0929)   [START ON 01/10/2024] vancomycin      (feeding supplement) PROSource Plus  30 mL Oral BID BM   aspirin EC  81 mg Oral Daily   atorvastatin  80 mg Oral Daily   carvedilol  25 mg Oral BID   Chlorhexidine Gluconate Cloth  6 each Topical Q0600   doxercalciferol  2 mcg Intravenous Q M,W,F-HD   finasteride  5 mg Oral Daily   gabapentin  300 mg Oral BID   heparin  5,000 Units  Subcutaneous Q8H   insulin aspart  0-5 Units Subcutaneous QHS   insulin aspart  0-9 Units Subcutaneous TID WC   isosorbide mononitrate  60 mg Oral Daily   sevelamer carbonate  1,600 mg Oral TID WC   tamsulosin  0.4 mg Oral QHS    Dialysis Orders:  AF MWF 4:00 425/800 EDW 80.4kg 2K/2Ca AVF No heparin  Post HD wt 3/10 - 75.4 kg  -Mircera 225 q 2 weeks (last 3/3) -Hectorol 2 q HD   Assessment/Plan: Sepsis/R foot wound - possible oseto on MRI. On IV Vanc/Zosyn. Blood cultures ngtd. Orthopedics consulted - may require BKA   ESRD -  HD MWF. Continue on schedule. Next HD Fri.   Hypertension/volume  - BP ok. Some volume on exam s/p IVFs. UF 2-2.5L. Follow weights    Anemia  - Hgb 10-11. On ESA as outpatient. Follow trends.   Metabolic bone disease -  Calcium acceptable. Follow Phos. Continue home meds   Nutrition - Renal diet, fluid restriction. Prot supp for low albumin     Tomasa Blase PA-C [facility] 01/09/2024,10:33 AM

## 2024-01-10 ENCOUNTER — Encounter (HOSPITAL_COMMUNITY)

## 2024-01-10 ENCOUNTER — Other Ambulatory Visit (HOSPITAL_COMMUNITY): Payer: Self-pay

## 2024-01-10 DIAGNOSIS — L089 Local infection of the skin and subcutaneous tissue, unspecified: Secondary | ICD-10-CM | POA: Diagnosis not present

## 2024-01-10 DIAGNOSIS — E11628 Type 2 diabetes mellitus with other skin complications: Secondary | ICD-10-CM | POA: Diagnosis not present

## 2024-01-10 LAB — CBC
HCT: 33 % — ABNORMAL LOW (ref 39.0–52.0)
Hemoglobin: 10.2 g/dL — ABNORMAL LOW (ref 13.0–17.0)
MCH: 28.1 pg (ref 26.0–34.0)
MCHC: 30.9 g/dL (ref 30.0–36.0)
MCV: 90.9 fL (ref 80.0–100.0)
Platelets: 158 10*3/uL (ref 150–400)
RBC: 3.63 MIL/uL — ABNORMAL LOW (ref 4.22–5.81)
RDW: 15.5 % (ref 11.5–15.5)
WBC: 10.1 10*3/uL (ref 4.0–10.5)
nRBC: 0 % (ref 0.0–0.2)

## 2024-01-10 LAB — RENAL FUNCTION PANEL
Albumin: 2.3 g/dL — ABNORMAL LOW (ref 3.5–5.0)
Anion gap: 14 (ref 5–15)
BUN: 32 mg/dL — ABNORMAL HIGH (ref 8–23)
CO2: 24 mmol/L (ref 22–32)
Calcium: 8.5 mg/dL — ABNORMAL LOW (ref 8.9–10.3)
Chloride: 98 mmol/L (ref 98–111)
Creatinine, Ser: 6.52 mg/dL — ABNORMAL HIGH (ref 0.61–1.24)
GFR, Estimated: 8 mL/min — ABNORMAL LOW (ref >60)
Glucose, Bld: 141 mg/dL — ABNORMAL HIGH (ref 70–99)
Phosphorus: 4.7 mg/dL — ABNORMAL HIGH (ref 2.5–4.6)
Potassium: 3.1 mmol/L — ABNORMAL LOW (ref 3.5–5.1)
Sodium: 136 mmol/L (ref 135–145)

## 2024-01-10 LAB — GLUCOSE, CAPILLARY
Glucose-Capillary: 119 mg/dL — ABNORMAL HIGH (ref 70–99)
Glucose-Capillary: 132 mg/dL — ABNORMAL HIGH (ref 70–99)
Glucose-Capillary: 156 mg/dL — ABNORMAL HIGH (ref 70–99)
Glucose-Capillary: 230 mg/dL — ABNORMAL HIGH (ref 70–99)
Glucose-Capillary: 98 mg/dL (ref 70–99)

## 2024-01-10 MED ORDER — OXYCODONE HCL 5 MG PO TABS
5.0000 mg | ORAL_TABLET | ORAL | Status: DC | PRN
  Administered 2024-01-11 – 2024-01-17 (×9): 5 mg via ORAL
  Filled 2024-01-10 (×10): qty 1

## 2024-01-10 MED ORDER — HYDROMORPHONE HCL 1 MG/ML IJ SOLN
0.5000 mg | INTRAMUSCULAR | Status: DC | PRN
  Administered 2024-01-18: 1 mg via INTRAVENOUS
  Filled 2024-01-10: qty 1

## 2024-01-10 NOTE — Progress Notes (Signed)
 [facility] Progress Note   Subjective:   Seen on HD. Reports having some chills. Denies SOB, CP, dizziness, nausea.   Objective Vitals:   01/10/24 0819 01/10/24 0843 01/10/24 0900 01/10/24 0930  BP: 138/64 137/65 (!) 141/62 138/72  Pulse: 70 67 75 72  Resp: 13 19 18 19   Temp: 99.6 F (37.6 C)     TempSrc: Oral     SpO2: 98% 99% 99% 100%  Weight: 77.1 kg     Height:       Physical Exam General: Alert male in NAD Heart: RRR, no murmurs, rubs or gallops Lungs: CTA bilaterally, respirations unlabored on RA Abdomen: Soft, non-distended, +BS Extremities: L BKA, no edema appreciated Dialysis Access:  LUE AVF acccessed  Additional Objective Labs: Basic Metabolic Panel: Recent Labs  Lab 01/08/24 0930 01/09/24 0427 01/10/24 0852  NA 136 135 136  K 3.3* 3.5 3.1*  CL 99 96* 98  CO2 28 27 24   GLUCOSE 95 90 141*  BUN 30* 20 32*  CREATININE 6.27* 4.51* 6.52*  CALCIUM 7.9* 8.3* 8.5*  PHOS 4.1 3.6 4.7*   Liver Function Tests: Recent Labs  Lab 01/06/24 1656 01/08/24 0930 01/09/24 0427 01/10/24 0852  AST 17  --   --   --   ALT 7  --   --   --   ALKPHOS 81  --   --   --   BILITOT 1.4*  --   --   --   PROT 8.9*  --   --   --   ALBUMIN 3.0* 2.2* 2.3* 2.3*   No results for input(s): "LIPASE", "AMYLASE" in the last 168 hours. CBC: Recent Labs  Lab 01/06/24 1656 01/09/24 0427 01/10/24 0852  WBC 13.8* 10.9* 10.1  NEUTROABS 12.1*  --   --   HGB 12.5* 10.2* 10.2*  HCT 40.3 32.5* 33.0*  MCV 92.9 91.3 90.9  PLT 189 158 158   Blood Culture    Component Value Date/Time   SDES BLOOD RIGHT HAND 01/06/2024 2138   SPECREQUEST  01/06/2024 2138    BOTTLES DRAWN AEROBIC AND ANAEROBIC Blood Culture results may not be optimal due to an inadequate volume of blood received in culture bottles   CULT  01/06/2024 2138    NO GROWTH 4 DAYS Performed at [facility], [address], [place], [place] [address]    REPTSTATUS PENDING 01/06/2024 2138    Cardiac  Enzymes: No results for input(s): "CKTOTAL", "CKMB", "CKMBINDEX", "TROPONINI" in the last 168 hours. CBG: Recent Labs  Lab 01/09/24 1146 01/09/24 1721 01/09/24 2107 01/10/24 0050 01/10/24 0757  GLUCAP 84 147* 111* 156* 119*   Iron Studies: No results for input(s): "IRON", "TIBC", "TRANSFERRIN", "FERRITIN" in the last 72 hours. @lablastinr3 @ Studies/Results: No results found. Medications:  piperacillin-tazobactam (ZOSYN)  IV 2.25 g (01/10/24 0042)   vancomycin      (feeding supplement) PROSource Plus  30 mL Oral BID BM   aspirin EC  81 mg Oral Daily   atorvastatin  80 mg Oral Daily   carvedilol  25 mg Oral BID   Chlorhexidine Gluconate Cloth  6 each Topical Q0600   doxercalciferol  2 mcg Intravenous Q M,W,F-HD   finasteride  5 mg Oral Daily   gabapentin  300 mg Oral BID   heparin  5,000 Units Subcutaneous Q8H   insulin aspart  0-5 Units Subcutaneous QHS   insulin aspart  0-9 Units Subcutaneous TID WC   isosorbide mononitrate  60 mg Oral  Daily   saccharomyces boulardii  250 mg Oral BID   sevelamer carbonate  1,600 mg Oral TID WC   tamsulosin  0.4 mg Oral QHS    Dialysis Orders: AF MWF 4:00 425/800 EDW 80.4kg 2K/2Ca AVF No heparin  Post HD wt 3/10 - 75.4 kg  -Mircera 225 q 2 weeks (last 3/3) -Hectorol 2 q HD  Assessment/Plan: Sepsis/R foot wound - possible oseto on MRI. On IV Vanc/Zosyn. Blood cultures ngtd. Orthopedics consulted - may require BKA but he is waiting to talk to Dr. Lajoyce Corners  ESRD -  HD MWF. Continue on schedule. K+ running low, use higher potassium bath  Hypertension/volume  - BP ok. s/p IVFs. UF 2-2.5L.   Anemia  - Hgb 10-11. On ESA as outpatient, not due for next dose yet. Follow trends.   Metabolic bone disease -  Calcium acceptable. Phos at goal. Continue home meds   Nutrition - Renal diet, fluid restriction. Prot supp for low albumin :    Rogers Blocker, PA-C 01/10/2024, 9:37 AM  [facility] Pager: [phone]

## 2024-01-10 NOTE — Progress Notes (Addendum)
 William Webb [facility]  [id number] DOB: [birth year] DOA: 01/06/2024 PCP: Irven Coe, MD    Brief Narrative:  73 year old with a history of CHF, CAD, ESRD on HD, HTN, HLD, and DM2 who presented to the ER 3/10 with 2 to 3 days of fever and generalized weakness.  He has a chronic wound on his right foot and he had noted increased drainage from this wound.  In the ER an x-ray of his right foot was concerning for osteomyelitis.  Goals of Care:   Code Status: Full Code   DVT prophylaxis: heparin injection 5,000 Units Start: 01/07/24 0600 SCDs Start: 01/07/24 0018   Interim Hx: Developed some watery clearish greenish stool without fever or abdominal pain.  Afebrile.  Vital signs otherwise stable.  Alert and conversant today.  Reports some poorly controlled pain in his foot.  No chest pain or shortness of breath.  Assessment & Plan:  Right diabetic foot infection - right heel ulceration Orthopedics evaluated and is concerned patient may require right BKA - patient/family not yet willing to pursue amputation - plan is to continue empiric broad antibiotic therapy until Dr. Lajoyce Corners (who has been managing him as an outpatient) can evaluate the patient on Monday -titrate pain medications today  ESRD on HD MWF Nephrology attending to inpatient dialysis - tolerating without complications  Antibiotic associated diarrhea No clinical symptoms to suggest C. difficile -continue probiotic and as needed Imodium  CAD Continue usual aspirin and Imdur -asymptomatic  Chronic Diastolic Heart Failure   HLD Continue usual Lipitor dose  HTN Continue usual Coreg dose -blood pressure presently well-controlled  BPH Continue finasteride and Flomax   Family Communication: No family present at time of exam Disposition: Unclear at this time   Objective: Blood pressure (!) 147/73, pulse 73, temperature 99.6 F (37.6 C), temperature source Oral, resp. rate 14, height 6\' 3"  (1.905 m), weight 77.1 kg, SpO2  100%.  Intake/Output Summary (Last 24 hours) at 01/10/2024 1110 Last data filed at 01/10/2024 0900 Gross per 24 hour  Intake 320 ml  Output 550 ml  Net -230 ml   Filed Weights   01/08/24 1229 01/09/24 0313 01/10/24 0819  Weight: 76.5 kg 75.4 kg 77.1 kg    Examination: General: No acute respiratory distress Lungs: Clear to auscultation bilaterally without wheezes or crackles Cardiovascular: Regular rate and rhythm without murmur gallop or rub normal S1 and S2 Abdomen: NT/ND, soft, BS positive, no rebound Extremities: No significant change in exam of bilateral lower extremities with multiple toes of the right foot appearing essentially mummified  CBC: Recent Labs  Lab 01/06/24 1656 01/09/24 0427 01/10/24 0852  WBC 13.8* 10.9* 10.1  NEUTROABS 12.1*  --   --   HGB 12.5* 10.2* 10.2*  HCT 40.3 32.5* 33.0*  MCV 92.9 91.3 90.9  PLT 189 158 158   Basic Metabolic Panel: Recent Labs  Lab 01/08/24 0930 01/09/24 0427 01/10/24 0852  NA 136 135 136  K 3.3* 3.5 3.1*  CL 99 96* 98  CO2 28 27 24   GLUCOSE 95 90 141*  BUN 30* 20 32*  CREATININE 6.27* 4.51* 6.52*  CALCIUM 7.9* 8.3* 8.5*  MG  --  1.9  --   PHOS 4.1 3.6 4.7*   GFR: Estimated Creatinine Clearance: 10.8 mL/min (A) (by C-G formula based on SCr of 6.52 mg/dL (H)).   Scheduled Meds:  (feeding supplement) PROSource Plus  30 mL Oral BID BM   aspirin EC  81 mg Oral Daily   atorvastatin  80  mg Oral Daily   carvedilol  25 mg Oral BID   Chlorhexidine Gluconate Cloth  6 each Topical Q0600   doxercalciferol  2 mcg Intravenous Q M,W,F-HD   finasteride  5 mg Oral Daily   gabapentin  300 mg Oral BID   heparin  5,000 Units Subcutaneous Q8H   insulin aspart  0-5 Units Subcutaneous QHS   insulin aspart  0-9 Units Subcutaneous TID WC   isosorbide mononitrate  60 mg Oral Daily   saccharomyces boulardii  250 mg Oral BID   sevelamer carbonate  1,600 mg Oral TID WC   tamsulosin  0.4 mg Oral QHS   Continuous Infusions:   piperacillin-tazobactam (ZOSYN)  IV 2.25 g (01/10/24 0042)   vancomycin       LOS: 1 day   Lonia Blood, MD [facility] Office  [phone] Pager - Text Page per Amion  If 7PM-7AM, please contact night-coverage per Amion 01/10/2024, 11:10 AM

## 2024-01-10 NOTE — Progress Notes (Signed)
 PT Cancellation Note  Patient Details Name: Lexie Morini [facility] MRN: [id number] DOB: [birth year]   Cancelled Treatment:    Reason Eval/Treat Not Completed: Patient at procedure or test/unavailable; will follow up as pt available and schedule permits.    Elray Mcgregor 01/10/2024, 9:34 AM Sheran Lawless, PT Acute Rehabilitation Services Office:[phone] 01/10/2024

## 2024-01-10 NOTE — Progress Notes (Signed)
 Received patient in bed to unit.  Alert and oriented.  Informed consent signed and in chart.   TX duration:3.5  Patient tolerated well.  Transported back to the room  Alert, without acute distress.  Hand-off given to patient's nurse.   Access used: LAVF Access issues: see tx flowsheet  Total UF removed: 2L Medication(s) given: vancomycin   01/10/24 1230  Vitals  Temp 98.7 F (37.1 C)  BP 134/68  MAP (mmHg) 95  Pulse Rate 75  ECG Heart Rate 77  Resp 18  Oxygen Therapy  SpO2 100 %  During Treatment Monitoring  Blood Flow Rate (mL/min) 0 mL/min  Arterial Pressure (mmHg) -83.02 mmHg  Venous Pressure (mmHg) 254.53 mmHg  TMP (mmHg) -5.86 mmHg  Ultrafiltration Rate (mL/min) 615 mL/min  Dialysate Flow Rate (mL/min) 299 ml/min  Dialysate Potassium Concentration 4  Dialysate Calcium Concentration 2.25  Duration of HD Treatment -hour(s) 3.67 hour(s)  Cumulative Fluid Removed (mL) per Treatment  2000.13  HD Safety Checks Performed Yes  Intra-Hemodialysis Comments Tx completed  Dialysis Fluid Bolus Normal Saline  Bolus Amount (mL) 300 mL  Post Treatment  Dialyzer Clearance Clear  Hemodialysis Intake (mL) 150 mL  Fluid Removed (mL) 2000 mL  Tolerated HD Treatment Yes  AVG/AVF Arterial Site Held (minutes) 5 minutes  AVG/AVF Venous Site Held (minutes) 5 minutes  Fistula / Graft Left Upper arm  No placement date or time found.   Placed prior to admission: Yes  Orientation: Left  Access Location: Upper arm  Site Condition Other (Comment) (visible thinning on acces surface as evidenced by patches of white skin; pt c/o prolonged bleeding post tx - no complications with tx today; drsg c/d/i upon pt departure from [facility])  Fistula / Graft Assessment Present;Thrill;Bruit  Status Deaccessed;Flushed;Patent      Freddi Starr, RN [facility]

## 2024-01-10 NOTE — Evaluation (Signed)
 Physical Therapy Evaluation Patient Details Name: William Webb MRN: [id number] DOB: [birth year] Today's Date: 01/10/2024  History of Present Illness  Pt is a 73 y/o male presenting on 3/10 with chronic R foot wound, lethargy. 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  PMH includes: ESRD HD MWF, DM2, HTN, L BKA.  Clinical Impression  Patient presents with decreased mobility due to generalized weakness, decreased balance, decreased cognition and poor activity tolerance.  Seen after dialysis and pt did not have his prosthetic.  Reports feels he can do better wearing it, though not sure of planned treatments for R LE.  Patient hopes to improve back to baseline with independence with transfers to be able to go home.  Currently not safe for home alone so recommending inpatient rehab (<3 hours/day) at d/c.         If plan is discharge home, recommend the following: A lot of help with walking and/or transfers;A lot of help with bathing/dressing/bathroom;Assist for transportation;Help with stairs or ramp for entrance   Can travel by private vehicle   Yes    Equipment Recommendations None recommended by PT  Recommendations for Other Services       Functional Status Assessment Patient has had a recent decline in their functional status and demonstrates the ability to make significant improvements in function in a reasonable and predictable amount of time.     Precautions / Restrictions Precautions Precautions: Fall Precaution/Restrictions Comments: chronic L BKA, R heel wound      Mobility  Bed Mobility Overal bed mobility: Needs Assistance Bed Mobility: Supine to Sit, Sit to Supine     Supine to sit: Used rails, HOB elevated, Min assist Sit to supine: Mod assist   General bed mobility comments: up to EOB with some assist for trunk, increased time to scoot forward; to supine assist for legs and trunk as pt leaning on bed after transfer from w/c    Transfers Overall transfer  level: Needs assistance   Transfers: Bed to chair/wheelchair/BSC            Lateral/Scoot Transfers: Max assist, Min assist General transfer comment: to wheel chair lower than bed with min A for completing scoot with increased time; to bed higher surface, pt scooting and stuck on wheel so assisted to lean over on R side then removed w/c and assisted legs into bed    Ambulation/Gait                  Administrator mobility: Yes Wheelchair propulsion: Both upper extremities Wheelchair parts: Supervision/cueing Distance: 90 Wheelchair Assistance Details (indicate cue type and reason): cues for brakes, assist for set up for transfer   Tilt Bed    Modified Rankin (Stroke Patients Only)       Balance Overall balance assessment: Needs assistance Sitting-balance support: Feet supported Sitting balance-Leahy Scale: Fair                                       Pertinent Vitals/Pain Pain Assessment Pain Assessment: Faces Faces Pain Scale: Hurts little more Pain Location: R foot Pain Descriptors / Indicators: Discomfort, Grimacing Pain Intervention(s): Monitored during session, Repositioned    Home Living Family/patient expects to be discharged to:: Private residence Living Arrangements: Alone Available Help at Discharge: Family;Available PRN/intermittently Type of Home: Apartment Home Access: Stairs to  enter Entrance Stairs-Rails: Right;Left;Can reach both Entrance Stairs-Number of Steps: 2   Home Layout: One level Home Equipment: Agricultural consultant (2 wheels);Wheelchair - manual;Cane - single point;BSC/3in1;Grab bars - tub/shower;Tub bench Additional Comments: prosthesis for L LE    Prior Function Prior Level of Function : Needs assist             Mobility Comments: neice assists with meals and has aide 2 x/week ADLs Comments: using wheelchair, going to outpatient PT to initiate gait  with prosthetic; takes transportation to HD     Extremity/Trunk Assessment   Upper Extremity Assessment Upper Extremity Assessment: Generalized weakness    Lower Extremity Assessment Lower Extremity Assessment: RLE deficits/detail;LLE deficits/detail RLE Deficits / Details: AROM WFL except ankle DF, foot wrapped with kerlix and noted heel padded with gauze; strength 4/5 LLE Deficits / Details: BKA strength and ROM WFL       Communication   Communication Communication: No apparent difficulties    Cognition Arousal: Alert Behavior During Therapy: Flat affect   PT - Cognitive impairments: No family/caregiver present to determine baseline, Memory, Problem solving, Safety/Judgement                       PT - Cognition Comments: needs cues for safety in hallway when maneuvering wheelchair and cues for wheelchair set up for safest transfer to bed Following commands: Intact Following commands impaired: Only follows one step commands consistently, Follows one step commands with increased time     Cueing       General Comments      Exercises     Assessment/Plan    PT Assessment Patient needs continued PT services  PT Problem List Decreased strength;Decreased mobility;Decreased activity tolerance;Decreased balance;Decreased safety awareness       PT Treatment Interventions Therapeutic exercise;Balance training;Functional mobility training;Therapeutic activities;Patient/family education;Wheelchair mobility training;DME instruction    PT Goals (Current goals can be found in the Care Plan section)  Acute Rehab PT Goals Patient Stated Goal: to go home PT Goal Formulation: With patient Time For Goal Achievement: 01/24/24 Potential to Achieve Goals: Fair    Frequency Min 3X/week     Co-evaluation               AM-PAC PT "6 Clicks" Mobility  Outcome Measure Help needed turning from your back to your side while in a flat bed without using bedrails?: A  Little Help needed moving from lying on your back to sitting on the side of a flat bed without using bedrails?: A Lot Help needed moving to and from a bed to a chair (including a wheelchair)?: Total Help needed standing up from a chair using your arms (e.g., wheelchair or bedside chair)?: Total Help needed to walk in hospital room?: Total Help needed climbing 3-5 steps with a railing? : Total 6 Click Score: 9    End of Session Equipment Utilized During Treatment: Gait belt Activity Tolerance: Patient limited by fatigue Patient left: in bed;with call bell/phone within reach;with bed alarm set   PT Visit Diagnosis: Muscle weakness (generalized) (M62.81);Other abnormalities of gait and mobility (R26.89)    Time: 9147-8295 PT Time Calculation (min) (ACUTE ONLY): 38 min   Charges:   PT Evaluation $PT Eval Moderate Complexity: 1 Mod PT Treatments $Therapeutic Activity: 8-22 mins $Wheel Chair Management: 8-22 mins PT General Charges $$ ACUTE PT VISIT: 1 Visit         Sheran Lawless, PT Acute Rehabilitation Services Office:[phone] 01/10/2024   William Webb 01/10/2024,  7:19 PM

## 2024-01-11 ENCOUNTER — Encounter (HOSPITAL_COMMUNITY)

## 2024-01-11 DIAGNOSIS — E11628 Type 2 diabetes mellitus with other skin complications: Secondary | ICD-10-CM | POA: Diagnosis not present

## 2024-01-11 DIAGNOSIS — L089 Local infection of the skin and subcutaneous tissue, unspecified: Secondary | ICD-10-CM | POA: Diagnosis not present

## 2024-01-11 LAB — CULTURE, BLOOD (ROUTINE X 2)
Culture: NO GROWTH
Culture: NO GROWTH

## 2024-01-11 LAB — GLUCOSE, CAPILLARY
Glucose-Capillary: 178 mg/dL — ABNORMAL HIGH (ref 70–99)
Glucose-Capillary: 165 mg/dL — ABNORMAL HIGH (ref 70–99)
Glucose-Capillary: 314 mg/dL — ABNORMAL HIGH (ref 70–99)
Glucose-Capillary: 113 mg/dL — ABNORMAL HIGH (ref 70–99)

## 2024-01-11 NOTE — Progress Notes (Signed)
 William Webb [facility]  WUJ:[id number] DOB: 05/04/1951 DOA: 01/06/2024 PCP: Irven Coe, MD    Brief Narrative:  73 year old with a history of CHF, CAD, ESRD on HD, HTN, HLD, and DM2 who presented to the ER 3/10 with 2 to 3 days of fever and generalized weakness.  He has a chronic wound on his right foot and he had noted increased drainage from this wound.  In the ER an x-ray of his right foot was concerning for osteomyelitis.  Goals of Care:   Code Status: Full Code   DVT prophylaxis: heparin injection 5,000 Units Start: 01/07/24 0600 SCDs Start: 01/07/24 0018   Interim Hx: Afebrile.  Vital signs stable.  No acute events recorded since the time of my last visit.  Feeling better today.  Denies any new complaints.  Reports appetite remains poor but is improving.  Assessment & Plan:  Right diabetic foot infection - right heel ulceration Orthopedics evaluated and is concerned patient may require right BKA - patient/family not yet willing to pursue amputation - plan is to continue empiric broad IV antibiotic therapy until Dr. Lajoyce Corners (who has been managing him as an outpatient) can evaluate the patient on Monday -titrate pain medications today  ESRD on HD MWF Nephrology attending to inpatient dialysis - tolerating without complications  Antibiotic associated diarrhea No clinical symptoms to suggest C. difficile - continue probiotic and as needed Imodium -improved per patient report today  CAD Continue usual aspirin and Imdur -asymptomatic  Chronic Diastolic Heart Failure   HLD Continue usual Lipitor dose  HTN Continue usual Coreg dose -blood pressure presently well-controlled  BPH Continue finasteride and Flomax   Family Communication: I spoke with the patient's daughter at bedside Disposition: Unclear at this time   Objective: Blood pressure 118/66, pulse 70, temperature 98 F (36.7 C), temperature source Oral, resp. rate 17, height 6\' 3"  (1.905 m), weight 75.1 kg, SpO2  100%.  Intake/Output Summary (Last 24 hours) at 01/11/2024 1118 Last data filed at 01/10/2024 1230 Gross per 24 hour  Intake --  Output 2000 ml  Net -2000 ml   Filed Weights   01/09/24 0313 01/10/24 0819 01/10/24 1238  Weight: 75.4 kg 77.1 kg 75.1 kg    Examination: General: No acute respiratory distress Lungs: Clear to auscultation bilaterally  Cardiovascular: Regular rate and rhythm without murmur gallop or rub normal S1 and S2 Abdomen: NT/ND, soft, BS positive, no rebound Extremities: No significant change in exam of bilateral lower extremities with multiple toes of the right foot appearing essentially mummified  CBC: Recent Labs  Lab 01/06/24 1656 01/09/24 0427 01/10/24 0852  WBC 13.8* 10.9* 10.1  NEUTROABS 12.1*  --   --   HGB 12.5* 10.2* 10.2*  HCT 40.3 32.5* 33.0*  MCV 92.9 91.3 90.9  PLT 189 158 158   Basic Metabolic Panel: Recent Labs  Lab 01/08/24 0930 01/09/24 0427 01/10/24 0852  NA 136 135 136  K 3.3* 3.5 3.1*  CL 99 96* 98  CO2 28 27 24   GLUCOSE 95 90 141*  BUN 30* 20 32*  CREATININE 6.27* 4.51* 6.52*  CALCIUM 7.9* 8.3* 8.5*  MG  --  1.9  --   PHOS 4.1 3.6 4.7*   GFR: Estimated Creatinine Clearance: 10.6 mL/min (A) (by C-G formula based on SCr of 6.52 mg/dL (H)).   Scheduled Meds:  (feeding supplement) PROSource Plus  30 mL Oral BID BM   aspirin EC  81 mg Oral Daily   atorvastatin  80 mg Oral Daily  carvedilol  25 mg Oral BID   Chlorhexidine Gluconate Cloth  6 each Topical Q0600   doxercalciferol  2 mcg Intravenous Q M,W,F-HD   finasteride  5 mg Oral Daily   gabapentin  300 mg Oral BID   heparin  5,000 Units Subcutaneous Q8H   insulin aspart  0-5 Units Subcutaneous QHS   insulin aspart  0-9 Units Subcutaneous TID WC   isosorbide mononitrate  60 mg Oral Daily   saccharomyces boulardii  250 mg Oral BID   sevelamer carbonate  1,600 mg Oral TID WC   tamsulosin  0.4 mg Oral QHS   Continuous Infusions:  piperacillin-tazobactam (ZOSYN)  IV  2.25 g (01/11/24 0922)   vancomycin Stopped (01/10/24 1335)     LOS: 2 days   Lonia Blood, MD [facility] Office  [phone] Pager - Text Page per Loretha Stapler  If 7PM-7AM, please contact night-coverage per Amion 01/11/2024, 11:18 AM

## 2024-01-11 NOTE — Progress Notes (Signed)
 [facility] Progress Note   Subjective:   Reports still having GI upset when he eats. Denies fever, chills. Denies SOB, CP, dizziness, nausea.   Objective Vitals:   01/10/24 1700 01/10/24 2133 01/11/24 0450 01/11/24 0829  BP: (!) 97/56 (!) 111/56 132/60 118/66  Pulse: 77 72 60 70  Resp: 16 19 17 17   Temp: 98.9 F (37.2 C) 99.1 F (37.3 C) 98.5 F (36.9 C) 98 F (36.7 C)  TempSrc: Oral Oral Oral Oral  SpO2: 98% 99% 100% 100%  Weight:      Height:       Physical Exam General: Alert male in NAD Heart: RRR, no murmurs, rubs or gallops Lungs: CTA bilaterally, respirations unlabored on RA Abdomen: Soft, non-distended, +BS Extremities: L BKA, no edema appreciated Dialysis Access:  LUE AVF + bruit  Additional Objective Labs: Basic Metabolic Panel: Recent Labs  Lab 01/08/24 0930 01/09/24 0427 01/10/24 0852  NA 136 135 136  K 3.3* 3.5 3.1*  CL 99 96* 98  CO2 28 27 24   GLUCOSE 95 90 141*  BUN 30* 20 32*  CREATININE 6.27* 4.51* 6.52*  CALCIUM 7.9* 8.3* 8.5*  PHOS 4.1 3.6 4.7*   Liver Function Tests: Recent Labs  Lab 01/06/24 1656 01/08/24 0930 01/09/24 0427 01/10/24 0852  AST 17  --   --   --   ALT 7  --   --   --   ALKPHOS 81  --   --   --   BILITOT 1.4*  --   --   --   PROT 8.9*  --   --   --   ALBUMIN 3.0* 2.2* 2.3* 2.3*   No results for input(s): "LIPASE", "AMYLASE" in the last 168 hours. CBC: Recent Labs  Lab 01/06/24 1656 01/09/24 0427 01/10/24 0852  WBC 13.8* 10.9* 10.1  NEUTROABS 12.1*  --   --   HGB 12.5* 10.2* 10.2*  HCT 40.3 32.5* 33.0*  MCV 92.9 91.3 90.9  PLT 189 158 158   Blood Culture    Component Value Date/Time   SDES BLOOD RIGHT HAND 01/06/2024 2138   SPECREQUEST  01/06/2024 2138    BOTTLES DRAWN AEROBIC AND ANAEROBIC Blood Culture results may not be optimal due to an inadequate volume of blood received in culture bottles   CULT  01/06/2024 2138    NO GROWTH 5 DAYS Performed at [facility] Lab, [address] [place], [place] [address]    REPTSTATUS 01/11/2024 FINAL 01/06/2024 2138    Cardiac Enzymes: No results for input(s): "CKTOTAL", "CKMB", "CKMBINDEX", "TROPONINI" in the last 168 hours. CBG: Recent Labs  Lab 01/10/24 0757 01/10/24 1331 01/10/24 1731 01/10/24 2135 01/11/24 0752  GLUCAP 119* 98 230* 132* 178*   Iron Studies: No results for input(s): "IRON", "TIBC", "TRANSFERRIN", "FERRITIN" in the last 72 hours. @lablastinr3 @ Studies/Results: No results found. Medications:  piperacillin-tazobactam (ZOSYN)  IV 2.25 g (01/11/24 6045)   vancomycin Stopped (01/10/24 1335)    (feeding supplement) PROSource Plus  30 mL Oral BID BM   aspirin EC  81 mg Oral Daily   atorvastatin  80 mg Oral Daily   carvedilol  25 mg Oral BID   Chlorhexidine Gluconate Cloth  6 each Topical Q0600   doxercalciferol  2 mcg Intravenous Q M,W,F-HD   finasteride  5 mg Oral Daily   gabapentin  300 mg Oral BID   heparin  5,000 Units Subcutaneous Q8H   insulin aspart  0-5 Units Subcutaneous QHS  insulin aspart  0-9 Units Subcutaneous TID WC   isosorbide mononitrate  60 mg Oral Daily   saccharomyces boulardii  250 mg Oral BID   sevelamer carbonate  1,600 mg Oral TID WC   tamsulosin  0.4 mg Oral QHS    Outpatient Dialysis Orders: AF MWF 4:00 425/800 EDW 80.4kg 2K/2Ca AVF No heparin  Post HD wt 3/10 - 75.4 kg  -Mircera 225 q 2 weeks (last 3/3) -Hectorol 2 q HD  Assessment/Plan: Sepsis/R foot wound - possible oseto on MRI. On IV Vanc/Zosyn. Blood cultures ngtd. Orthopedics consulted - may require BKA but he is waiting to talk to Dr. Lajoyce Corners  ESRD -  HD MWF. Continue on schedule. K+ running low, use higher potassium bath  Hypertension/volume  - BP ok. s/p IVFs. Appears euvolemic on exam  Anemia  - Hgb 10-11. On ESA as outpatient, not due for next dose yet. Follow trends.   Metabolic bone disease -  Calcium acceptable. Phos at goal. Continue home meds   Nutrition - Renal diet, fluid restriction. Prot supp  for low albumin :    Rogers Blocker, PA-C 01/11/2024, 9:26 AM  [facility] Pager: [phone]

## 2024-01-12 ENCOUNTER — Encounter (HOSPITAL_COMMUNITY)

## 2024-01-12 DIAGNOSIS — L089 Local infection of the skin and subcutaneous tissue, unspecified: Secondary | ICD-10-CM | POA: Diagnosis not present

## 2024-01-12 DIAGNOSIS — E11628 Type 2 diabetes mellitus with other skin complications: Secondary | ICD-10-CM | POA: Diagnosis not present

## 2024-01-12 LAB — RENAL FUNCTION PANEL
Albumin: 2.2 g/dL — ABNORMAL LOW (ref 3.5–5.0)
Anion gap: 18 — ABNORMAL HIGH (ref 5–15)
BUN: 34 mg/dL — ABNORMAL HIGH (ref 8–23)
CO2: 24 mmol/L (ref 22–32)
Calcium: 8.4 mg/dL — ABNORMAL LOW (ref 8.9–10.3)
Chloride: 91 mmol/L — ABNORMAL LOW (ref 98–111)
Creatinine, Ser: 7.05 mg/dL — ABNORMAL HIGH (ref 0.61–1.24)
GFR, Estimated: 8 mL/min — ABNORMAL LOW (ref >60)
Glucose, Bld: 118 mg/dL — ABNORMAL HIGH (ref 70–99)
Phosphorus: 4.4 mg/dL (ref 2.5–4.6)
Potassium: 3.4 mmol/L — ABNORMAL LOW (ref 3.5–5.1)
Sodium: 133 mmol/L — ABNORMAL LOW (ref 135–145)

## 2024-01-12 LAB — GLUCOSE, CAPILLARY
Glucose-Capillary: 129 mg/dL — ABNORMAL HIGH (ref 70–99)
Glucose-Capillary: 237 mg/dL — ABNORMAL HIGH (ref 70–99)
Glucose-Capillary: 123 mg/dL — ABNORMAL HIGH (ref 70–99)
Glucose-Capillary: 167 mg/dL — ABNORMAL HIGH (ref 70–99)

## 2024-01-12 MED ORDER — SODIUM CHLORIDE 0.9 % IV BOLUS
250.0000 mL | Freq: Once | INTRAVENOUS | Status: AC
  Administered 2024-01-12: 250 mL via INTRAVENOUS

## 2024-01-12 MED ORDER — ENSURE ENLIVE PO LIQD
237.0000 mL | Freq: Two times a day (BID) | ORAL | Status: DC
Start: 2024-01-12 — End: 2024-01-23
  Administered 2024-01-14 – 2024-01-19 (×7): 237 mL via ORAL

## 2024-01-12 MED ORDER — CHLORHEXIDINE GLUCONATE CLOTH 2 % EX PADS
6.0000 | MEDICATED_PAD | Freq: Every day | CUTANEOUS | Status: DC
  Administered 2024-01-12: 6 via TOPICAL

## 2024-01-12 NOTE — Progress Notes (Signed)
 Pharmacy Antibiotic Note  William Webb is a 73 y.o. male admitted on 01/06/2024 with  limb threatening infection .  Pharmacy has been consulted for vancomycin dosing.  Plan: Vancomycin 1750mg  IV x1 then 750 mg after HD on MWF schedule -goal trough 15-45mcg/mL Levels PRN per protocol Continue zosyn 2.25g IV q8h  Dr. Lajoyce Corners to see on Monday   Height: 6\' 3"  (190.5 cm) Weight: 75.1 kg (165 lb 9.1 oz) IBW/kg (Calculated) : 84.5  Temp (24hrs), Avg:98.1 F (36.7 C), Min:97.6 F (36.4 C), Max:98.5 F (36.9 C)  Recent Labs  Lab 01/06/24 1656 01/06/24 1714 01/07/24 0224 01/07/24 0524 01/08/24 0930 01/09/24 0427 01/10/24 0852  WBC 13.8*  --   --   --   --  10.9* 10.1  CREATININE 4.32*  --   --   --  6.27* 4.51* 6.52*  LATICACIDVEN  --  2.0* 3.2* 1.5  --   --   --     Estimated Creatinine Clearance: 10.6 mL/min (A) (by C-G formula based on SCr of 6.52 mg/dL (H)).    No Known Allergies  Antimicrobials this admission: Vanc x 1 on 3/10; 3/12> Zosyn 3/11 >>  Dose adjustments this admission:   Microbiology results: 3/10 RVP neg 3/10 BCX: NGx2  Thank you Okey Regal, PharmD 01/12/2024 8:36 AM

## 2024-01-12 NOTE — Plan of Care (Signed)

## 2024-01-12 NOTE — Progress Notes (Signed)
 William Webb [facility]  LKG:[id number] DOB: December 03, 1950 DOA: 01/06/2024 PCP: Irven Coe, MD    Brief Narrative:  73 year old with a history of CHF, CAD, ESRD on HD, HTN, HLD, and DM2 who presented to the ER 3/10 with 2 to 3 days of fever and generalized weakness.  He has a chronic wound on his right foot and he had noted increased drainage from this wound.  In the ER an x-ray of his right foot was concerning for osteomyelitis.  Goals of Care:   Code Status: Full Code   DVT prophylaxis: heparin injection 5,000 Units Start: 01/07/24 0600 SCDs Start: 01/07/24 0018   Interim Hx: No acute events recorded overnight.  Afebrile.  Vital signs stable.  Resting comfortably in bed at time of visit today.  Voices no new concerns.  Assessment & Plan:  Right diabetic foot infection - right heel ulceration Orthopedics evaluated and is concerned patient may require right BKA - patient/family not yet willing to pursue amputation - plan is to continue empiric broad IV antibiotic therapy until Dr. Lajoyce Corners (who has been managing him as an outpatient) can evaluate the patient on Monday  ESRD on HD MWF Nephrology attending to inpatient dialysis - tolerating without complications  Antibiotic associated diarrhea No clinical symptoms to suggest C. difficile - continue probiotic and as needed Imodium - improved per patient report   CAD Continue usual aspirin and Imdur -asymptomatic  Chronic Diastolic Heart Failure  Euvolemic on exam -volume management per dialysis  HLD Continue usual Lipitor dose  HTN Continue usual Coreg dose -blood pressure presently well-controlled  BPH Continue finasteride and Flomax   Family Communication: No family present at time of exam the day Disposition: Awaiting follow-up evaluation by orthopedics scheduled for 3/17   Objective: Blood pressure 129/69, pulse 64, temperature 98.5 F (36.9 C), resp. rate 18, height 6\' 3"  (1.905 m), weight 75.1 kg, SpO2  100%.  Intake/Output Summary (Last 24 hours) at 01/12/2024 1047 Last data filed at 01/12/2024 0615 Gross per 24 hour  Intake 460 ml  Output 0 ml  Net 460 ml   Filed Weights   01/09/24 0313 01/10/24 0819 01/10/24 1238  Weight: 75.4 kg 77.1 kg 75.1 kg    Examination: General: No acute respiratory distress Lungs: Clear to auscultation bilaterally  Cardiovascular: RRR Abdomen: NT/ND, soft, BS positive, no rebound Extremities: No significant change in exam of bilateral lower extremities with multiple toes of the right foot appearing essentially mummified  CBC: Recent Labs  Lab 01/06/24 1656 01/09/24 0427 01/10/24 0852  WBC 13.8* 10.9* 10.1  NEUTROABS 12.1*  --   --   HGB 12.5* 10.2* 10.2*  HCT 40.3 32.5* 33.0*  MCV 92.9 91.3 90.9  PLT 189 158 158   Basic Metabolic Panel: Recent Labs  Lab 01/08/24 0930 01/09/24 0427 01/10/24 0852  NA 136 135 136  K 3.3* 3.5 3.1*  CL 99 96* 98  CO2 28 27 24   GLUCOSE 95 90 141*  BUN 30* 20 32*  CREATININE 6.27* 4.51* 6.52*  CALCIUM 7.9* 8.3* 8.5*  MG  --  1.9  --   PHOS 4.1 3.6 4.7*   GFR: Estimated Creatinine Clearance: 10.6 mL/min (A) (by C-G formula based on SCr of 6.52 mg/dL (H)).   Scheduled Meds:  (feeding supplement) PROSource Plus  30 mL Oral BID BM   aspirin EC  81 mg Oral Daily   atorvastatin  80 mg Oral Daily   carvedilol  25 mg Oral BID   Chlorhexidine Gluconate Cloth  6 each Topical Q0600   Chlorhexidine Gluconate Cloth  6 each Topical Q0600   doxercalciferol  2 mcg Intravenous Q M,W,F-HD   finasteride  5 mg Oral Daily   gabapentin  300 mg Oral BID   heparin  5,000 Units Subcutaneous Q8H   insulin aspart  0-5 Units Subcutaneous QHS   insulin aspart  0-9 Units Subcutaneous TID WC   isosorbide mononitrate  60 mg Oral Daily   saccharomyces boulardii  250 mg Oral BID   sevelamer carbonate  1,600 mg Oral TID WC   tamsulosin  0.4 mg Oral QHS   Continuous Infusions:  piperacillin-tazobactam (ZOSYN)  IV 2.25 g  (01/12/24 1029)   vancomycin Stopped (01/10/24 1335)     LOS: 3 days   Lonia Blood, MD [facility] Office  [phone] Pager - Text Page per Loretha Stapler  If 7PM-7AM, please contact night-coverage per Amion 01/12/2024, 10:47 AM

## 2024-01-12 NOTE — Progress Notes (Signed)
 [facility] Progress Note   Subjective:   Seen in room, denies concerns today, reports he is feeling well. No SOB, chills, nausea.   Objective Vitals:   01/11/24 0450 01/11/24 0829 01/11/24 1618 01/12/24 0522  BP: 132/60 118/66 (!) 106/52 129/69  Pulse: 60 70 70 64  Resp: 17 17 18    Temp: 98.5 F (36.9 C) 98 F (36.7 C) 97.6 F (36.4 C) 98.5 F (36.9 C)  TempSrc: Oral Oral Oral   SpO2: 100% 100% 99% 100%  Weight:      Height:       Physical Exam General: Alert male in NAD Heart: RRR, no murmurs, rubs or gallops Lungs: CTA bilaterally, respirations unlabored on RA Abdomen: Soft, non-distended, +BS Extremities: L BKA, no edema appreciated Dialysis Access:  LUE AVF  Additional Objective Labs: Basic Metabolic Panel: Recent Labs  Lab 01/08/24 0930 01/09/24 0427 01/10/24 0852  NA 136 135 136  K 3.3* 3.5 3.1*  CL 99 96* 98  CO2 28 27 24   GLUCOSE 95 90 141*  BUN 30* 20 32*  CREATININE 6.27* 4.51* 6.52*  CALCIUM 7.9* 8.3* 8.5*  PHOS 4.1 3.6 4.7*   Liver Function Tests: Recent Labs  Lab 01/06/24 1656 01/08/24 0930 01/09/24 0427 01/10/24 0852  AST 17  --   --   --   ALT 7  --   --   --   ALKPHOS 81  --   --   --   BILITOT 1.4*  --   --   --   PROT 8.9*  --   --   --   ALBUMIN 3.0* 2.2* 2.3* 2.3*   No results for input(s): "LIPASE", "AMYLASE" in the last 168 hours. CBC: Recent Labs  Lab 01/06/24 1656 01/09/24 0427 01/10/24 0852  WBC 13.8* 10.9* 10.1  NEUTROABS 12.1*  --   --   HGB 12.5* 10.2* 10.2*  HCT 40.3 32.5* 33.0*  MCV 92.9 91.3 90.9  PLT 189 158 158   Blood Culture    Component Value Date/Time   SDES BLOOD RIGHT HAND 01/06/2024 2138   SPECREQUEST  01/06/2024 2138    BOTTLES DRAWN AEROBIC AND ANAEROBIC Blood Culture results may not be optimal due to an inadequate volume of blood received in culture bottles   CULT  01/06/2024 2138    NO GROWTH 5 DAYS Performed at [facility], [address] [place], [place]  [address]    REPTSTATUS 01/11/2024 FINAL 01/06/2024 2138    Cardiac Enzymes: No results for input(s): "CKTOTAL", "CKMB", "CKMBINDEX", "TROPONINI" in the last 168 hours. CBG: Recent Labs  Lab 01/11/24 0752 01/11/24 1136 01/11/24 1711 01/11/24 2041 01/12/24 0727  GLUCAP 178* 165* 314* 113* 129*   Iron Studies: No results for input(s): "IRON", "TIBC", "TRANSFERRIN", "FERRITIN" in the last 72 hours. @lablastinr3 @ Studies/Results: No results found. Medications:  piperacillin-tazobactam (ZOSYN)  IV 2.25 g (01/12/24 0059)   vancomycin Stopped (01/10/24 1335)    (feeding supplement) PROSource Plus  30 mL Oral BID BM   aspirin EC  81 mg Oral Daily   atorvastatin  80 mg Oral Daily   carvedilol  25 mg Oral BID   Chlorhexidine Gluconate Cloth  6 each Topical Q0600   doxercalciferol  2 mcg Intravenous Q M,W,F-HD   finasteride  5 mg Oral Daily   gabapentin  300 mg Oral BID   heparin  5,000 Units Subcutaneous Q8H   insulin aspart  0-5 Units Subcutaneous QHS   insulin aspart  0-9  Units Subcutaneous TID WC   isosorbide mononitrate  60 mg Oral Daily   saccharomyces boulardii  250 mg Oral BID   sevelamer carbonate  1,600 mg Oral TID WC   tamsulosin  0.4 mg Oral QHS    Outpatient Dialysis Orders: AF MWF 4:00 425/800 EDW 80.4kg 2K/2Ca AVF No heparin  Post HD wt 3/10 - 75.4 kg  -Mircera 225 q 2 weeks (last 3/3) -Hectorol 2 q HD  Assessment/Plan: Sepsis/R foot wound - possible oseto on MRI. On IV Vanc/Zosyn. Blood cultures ngtd. Orthopedics consulted - may require BKA but he is waiting to talk to Dr. Lajoyce Corners  ESRD -  HD MWF. Continue on schedule. K+ running low, use higher potassium bath  Hypertension/volume  - BP ok. s/p IVFs. Appears euvolemic on exam  Anemia  - Hgb 10-11. On ESA as outpatient, not due for next dose yet. Follow trends.   Metabolic bone disease -  Calcium acceptable. Phos at goal. Continue home meds   Nutrition - Renal diet, fluid restriction. Prot supp for low albumin :     Rogers Blocker, PA-C 01/12/2024, 8:38 AM  [facility] Pager: [phone]

## 2024-01-13 ENCOUNTER — Encounter (HOSPITAL_COMMUNITY)

## 2024-01-13 DIAGNOSIS — I96 Gangrene, not elsewhere classified: Secondary | ICD-10-CM | POA: Diagnosis not present

## 2024-01-13 DIAGNOSIS — M869 Osteomyelitis, unspecified: Secondary | ICD-10-CM | POA: Diagnosis not present

## 2024-01-13 DIAGNOSIS — L089 Local infection of the skin and subcutaneous tissue, unspecified: Secondary | ICD-10-CM | POA: Diagnosis not present

## 2024-01-13 DIAGNOSIS — E11628 Type 2 diabetes mellitus with other skin complications: Secondary | ICD-10-CM | POA: Diagnosis not present

## 2024-01-13 LAB — GLUCOSE, CAPILLARY
Glucose-Capillary: 140 mg/dL — ABNORMAL HIGH (ref 70–99)
Glucose-Capillary: 98 mg/dL (ref 70–99)
Glucose-Capillary: 128 mg/dL — ABNORMAL HIGH (ref 70–99)

## 2024-01-13 NOTE — Progress Notes (Signed)
 [facility] Progress Note   Subjective:    Seen and examined patient at bedside. Plan for HD this afternoon. Ortho consulted today 2nd ischemic changes to right foot. Plan for right transtibial amputation on 3/19.   Objective Vitals:   01/13/24 1300 01/13/24 1330 01/13/24 1400 01/13/24 1430  BP: 134/72 (!) 142/86 137/80 135/79  Pulse: 74 77 77 76  Resp: 18 16 14 17   Temp:      TempSrc:      SpO2: 100% 100% 92% 98%  Weight:      Height:       Physical Exam General: Alert male in NAD Heart: RRR, no murmurs, rubs or gallops Lungs: CTA bilaterally, respirations unlabored on RA Abdomen: Soft, non-distended, +BS Extremities: L BKA, no edema appreciated Dialysis Access:  LUE AVF + bruit  Filed Weights   01/10/24 0819 01/10/24 1238 01/13/24 1138  Weight: 77.1 kg 75.1 kg 70.6 kg    Intake/Output Summary (Last 24 hours) at 01/13/2024 1457 Last data filed at 01/13/2024 0516 Gross per 24 hour  Intake 240 ml  Output 0 ml  Net 240 ml    Additional Objective Labs: Basic Metabolic Panel: Recent Labs  Lab 01/09/24 0427 01/10/24 0852 01/12/24 1834  NA 135 136 133*  K 3.5 3.1* 3.4*  CL 96* 98 91*  CO2 27 24 24   GLUCOSE 90 141* 118*  BUN 20 32* 34*  CREATININE 4.51* 6.52* 7.05*  CALCIUM 8.3* 8.5* 8.4*  PHOS 3.6 4.7* 4.4   Liver Function Tests: Recent Labs  Lab 01/06/24 1656 01/08/24 0930 01/09/24 0427 01/10/24 0852 01/12/24 1834  AST 17  --   --   --   --   ALT 7  --   --   --   --   ALKPHOS 81  --   --   --   --   BILITOT 1.4*  --   --   --   --   PROT 8.9*  --   --   --   --   ALBUMIN 3.0*   < > 2.3* 2.3* 2.2*   < > = values in this interval not displayed.   No results for input(s): "LIPASE", "AMYLASE" in the last 168 hours. CBC: Recent Labs  Lab 01/06/24 1656 01/09/24 0427 01/10/24 0852  WBC 13.8* 10.9* 10.1  NEUTROABS 12.1*  --   --   HGB 12.5* 10.2* 10.2*  HCT 40.3 32.5* 33.0*  MCV 92.9 91.3 90.9  PLT 189 158 158   Blood Culture     Component Value Date/Time   SDES BLOOD RIGHT HAND 01/06/2024 2138   SPECREQUEST  01/06/2024 2138    BOTTLES DRAWN AEROBIC AND ANAEROBIC Blood Culture results may not be optimal due to an inadequate volume of blood received in culture bottles   CULT  01/06/2024 2138    NO GROWTH 5 DAYS Performed at [facility], [address], [place], [place] [address]    REPTSTATUS 01/11/2024 FINAL 01/06/2024 2138    Cardiac Enzymes: No results for input(s): "CKTOTAL", "CKMB", "CKMBINDEX", "TROPONINI" in the last 168 hours. CBG: Recent Labs  Lab 01/12/24 0727 01/12/24 1131 01/12/24 1723 01/12/24 2028 01/13/24 0732  GLUCAP 129* 237* 123* 167* 140*   Iron Studies: No results for input(s): "IRON", "TIBC", "TRANSFERRIN", "FERRITIN" in the last 72 hours. Lab Results  Component Value Date   INR 1.3 (H) 10/19/2023   INR 1.4 (H) 05/20/2023   INR 1.4 (H) 03/21/2023   Studies/Results: No  results found.  Medications:  piperacillin-tazobactam (ZOSYN)  IV 2.25 g (01/13/24 1010)   vancomycin 750 mg (01/13/24 1445)    (feeding supplement) PROSource Plus  30 mL Oral BID BM   aspirin EC  81 mg Oral Daily   atorvastatin  80 mg Oral Daily   Chlorhexidine Gluconate Cloth  6 each Topical Q0600   Chlorhexidine Gluconate Cloth  6 each Topical Q0600   doxercalciferol  2 mcg Intravenous Q M,W,F-HD   feeding supplement  237 mL Oral BID BM   finasteride  5 mg Oral Daily   gabapentin  300 mg Oral BID   heparin  5,000 Units Subcutaneous Q8H   insulin aspart  0-5 Units Subcutaneous QHS   insulin aspart  0-9 Units Subcutaneous TID WC   saccharomyces boulardii  250 mg Oral BID   sevelamer carbonate  1,600 mg Oral TID WC   tamsulosin  0.4 mg Oral QHS    Dialysis Orders: AF MWF 4:00 425/800 EDW 80.4kg 2K/2Ca AVF No heparin  Post HD wt 3/10 - 75.4 kg  -Mircera 225 q 2 weeks (last 3/3) -Hectorol 2 q HD  Assessment/Plan: Sepsis/R foot wound - possible oseto on MRI. On IV Vanc/Zosyn. Blood cultures  ngtd. Orthopedics consulted - Plan for right transtibial amputation on 3/19. Per Ortho, likely will need inpatient rehab vs outpatient rehab. ESRD -  HD MWF. Continue on schedule. K+ running low, use higher potassium bath Hypertension/volume  - BP ok. s/p IVFs. Appears euvolemic on exam Anemia  - Hgb 10-11. On ESA as outpatient, not due for next dose yet. Follow trends.  Metabolic bone disease -  Calcium acceptable. Phos at goal. Continue home meds  Nutrition - Renal diet, fluid restriction. Prot supp for low albumin   Salome Holmes, NP [facility] 01/13/2024,2:57 PM  LOS: 4 days
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
## 2024-01-13 NOTE — Progress Notes (Signed)
 William Webb [facility]  ZOX:[id number] DOB: December 12, 1950 DOA: 01/06/2024 PCP: Irven Coe, MD    Brief Narrative:  73 year old with a history of CHF, CAD, ESRD on HD, HTN, HLD, and DM2 who presented to the ER 3/10 with 2 to 3 days of fever and generalized weakness.  He has a chronic wound on his right foot and he had noted increased drainage from this wound.  In the ER an x-ray of his right foot was concerning for osteomyelitis.  Goals of Care:   Code Status: Full Code   DVT prophylaxis: heparin injection 5,000 Units Start: 01/07/24 0600 SCDs Start: 01/07/24 0018   Interim Hx: Afebrile.  Vital stable.  No acute events overnight.  Resting comfortably in bed.  No new complaints today.  Assessment & Plan:  Right diabetic foot infection - right heel ulceration Orthopedics evaluated and was concerned patient may require right BKA - patient/family understandably wanted to wait until his primary orthopedist, Dr. Lajoyce Corners could evaluate the wound - Dr. Lajoyce Corners saw the patient today and is recommending proceeding with right transtibial amputation on 3/19  ESRD on HD MWF Nephrology attending to inpatient dialysis - tolerating without complications  Antibiotic associated diarrhea No clinical symptoms to suggest C. difficile - continue probiotic and as needed Imodium - improved per patient report   DM2 CBG well-controlled - utilizes only as needed insulin at home  CAD Continue usual aspirin and Imdur -asymptomatic  Chronic Diastolic Heart Failure  Euvolemic on exam -volume management per dialysis  HLD Continue usual Lipitor dose  HTN Continue usual Coreg dose -blood pressure presently well-controlled  BPH Continue finasteride and Flomax   Family Communication: No family present at time of exam today Disposition: Anticipate rehab stay will be necessary after amputation   Objective: Blood pressure 117/71, pulse 84, temperature 98.3 F (36.8 C), resp. rate 18, height 6\' 3"  (1.905 m),  weight 75.1 kg, SpO2 97%.  Intake/Output Summary (Last 24 hours) at 01/13/2024 1010 Last data filed at 01/13/2024 0516 Gross per 24 hour  Intake 480 ml  Output 0 ml  Net 480 ml   Filed Weights   01/09/24 0313 01/10/24 0819 01/10/24 1238  Weight: 75.4 kg 77.1 kg 75.1 kg    Examination: General: No acute respiratory distress Lungs: Clear to auscultation bilaterally  Cardiovascular: RRR Abdomen: NT/ND, soft, BS positive, no rebound Extremities: No significant change in exam of bilateral lower extremities with multiple toes of the right foot appearing essentially mummified  CBC: Recent Labs  Lab 01/06/24 1656 01/09/24 0427 01/10/24 0852  WBC 13.8* 10.9* 10.1  NEUTROABS 12.1*  --   --   HGB 12.5* 10.2* 10.2*  HCT 40.3 32.5* 33.0*  MCV 92.9 91.3 90.9  PLT 189 158 158   Basic Metabolic Panel: Recent Labs  Lab 01/09/24 0427 01/10/24 0852 01/12/24 1834  NA 135 136 133*  K 3.5 3.1* 3.4*  CL 96* 98 91*  CO2 27 24 24   GLUCOSE 90 141* 118*  BUN 20 32* 34*  CREATININE 4.51* 6.52* 7.05*  CALCIUM 8.3* 8.5* 8.4*  MG 1.9  --   --   PHOS 3.6 4.7* 4.4   GFR: Estimated Creatinine Clearance: 9.8 mL/min (A) (by C-G formula based on SCr of 7.05 mg/dL (H)).   Scheduled Meds:  (feeding supplement) PROSource Plus  30 mL Oral BID BM   aspirin EC  81 mg Oral Daily   atorvastatin  80 mg Oral Daily   Chlorhexidine Gluconate Cloth  6 each Topical Q0600  Chlorhexidine Gluconate Cloth  6 each Topical Q0600   doxercalciferol  2 mcg Intravenous Q M,W,F-HD   feeding supplement  237 mL Oral BID BM   finasteride  5 mg Oral Daily   gabapentin  300 mg Oral BID   heparin  5,000 Units Subcutaneous Q8H   insulin aspart  0-5 Units Subcutaneous QHS   insulin aspart  0-9 Units Subcutaneous TID WC   saccharomyces boulardii  250 mg Oral BID   sevelamer carbonate  1,600 mg Oral TID WC   tamsulosin  0.4 mg Oral QHS   Continuous Infusions:  piperacillin-tazobactam (ZOSYN)  IV 2.25 g (01/13/24  0132)   vancomycin Stopped (01/10/24 1335)     LOS: 4 days   Lonia Blood, MD [facility] Office  [phone] Pager - Text Page per Loretha Stapler  If 7PM-7AM, please contact night-coverage per Amion 01/13/2024, 10:10 AM

## 2024-01-13 NOTE — Progress Notes (Signed)
 PT Cancellation Note  Patient Details Name: William Webb [facility] MRN: [id number] DOB: [birth year]   Cancelled Treatment:    Reason Eval/Treat Not Completed: Patient at procedure or test/unavailable. Pt currently off unit for HD. Will check back as schedule allows to continue with PT POC.    Marylynn Pearson 01/13/2024, 12:50 PM  Conni Slipper, PT, DPT Acute Rehabilitation Services Secure Chat Preferred Office: [phone]

## 2024-01-13 NOTE — Care Management Important Message (Signed)
 Important Message  Patient Details  Name: William Webb MRN: [id number] Date of Birth: [birth year]   Important Message Given:  Yes - Medicare IM     Dorena Bodo 01/13/2024, 2:40 PM

## 2024-01-13 NOTE — Progress Notes (Signed)
   01/13/24 1531  Vitals  Temp 99.8 F (37.7 C)  Pulse Rate 73  Resp 12  BP (!) 148/67  SpO2 99 %  O2 Device Room Air  Type of Weight Post-Dialysis  Oxygen Therapy  Patient Activity (if Appropriate) In bed  Pulse Oximetry Type Continuous  Oximetry Probe Site Changed No  During Treatment Monitoring  Blood Flow Rate (mL/min) 400 mL/min  Arterial Pressure (mmHg) -177.97 mmHg  Venous Pressure (mmHg) 261.81 mmHg  TMP (mmHg) 7.68 mmHg  Ultrafiltration Rate (mL/min) 763 mL/min  Dialysate Flow Rate (mL/min) 300 ml/min  Dialysate Potassium Concentration 3  Dialysate Calcium Concentration 2.5  Duration of HD Treatment -hour(s) 3.5 hour(s)  Cumulative Fluid Removed (mL) per Treatment  1999.8  HD Safety Checks Performed Yes  Intra-Hemodialysis Comments Tx completed   Received patient in bed to unit.  Alert and oriented.  Informed consent signed and in chart.   TX duration: 3.5 hours  Patient tolerated well.  Transported back to the room  Alert, without acute distress.  Hand-off given to patient's nurse.   Access used: RAVF Access issues: None  Total UF removed: 2000 Medication(s) given: See Wanda Plump, LPN  [facility]
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
## 2024-01-14 ENCOUNTER — Ambulatory Visit: Payer: Medicare Other | Admitting: Orthopedic Surgery

## 2024-01-14 ENCOUNTER — Inpatient Hospital Stay (HOSPITAL_COMMUNITY)

## 2024-01-14 DIAGNOSIS — M6289 Other specified disorders of muscle: Secondary | ICD-10-CM | POA: Diagnosis not present

## 2024-01-14 DIAGNOSIS — L089 Local infection of the skin and subcutaneous tissue, unspecified: Secondary | ICD-10-CM | POA: Diagnosis not present

## 2024-01-14 DIAGNOSIS — I739 Peripheral vascular disease, unspecified: Secondary | ICD-10-CM

## 2024-01-14 DIAGNOSIS — E11628 Type 2 diabetes mellitus with other skin complications: Secondary | ICD-10-CM | POA: Diagnosis not present

## 2024-01-14 LAB — GLUCOSE, CAPILLARY
Glucose-Capillary: 104 mg/dL — ABNORMAL HIGH (ref 70–99)
Glucose-Capillary: 139 mg/dL — ABNORMAL HIGH (ref 70–99)
Glucose-Capillary: 129 mg/dL — ABNORMAL HIGH (ref 70–99)
Glucose-Capillary: 98 mg/dL (ref 70–99)

## 2024-01-14 LAB — HEMOGLOBIN A1C
Hgb A1c MFr Bld: 6.3 % — ABNORMAL HIGH (ref 4.8–5.6)
Mean Plasma Glucose: 134.11 mg/dL

## 2024-01-14 LAB — VAS US ABI WITH/WO TBI: Right ABI: 0.79

## 2024-01-14 LAB — PREALBUMIN: Prealbumin: 8 mg/dL — ABNORMAL LOW (ref 18–38)

## 2024-01-14 MED ORDER — TRANEXAMIC ACID 1000 MG/10ML IV SOLN
2000.0000 mg | INTRAVENOUS | Status: DC
  Filled 2024-01-14: qty 20

## 2024-01-14 MED ORDER — POVIDONE-IODINE 10 % EX SWAB
2.0000 | Freq: Once | CUTANEOUS | Status: AC
  Administered 2024-01-15: 2 via TOPICAL

## 2024-01-14 MED ORDER — CHLORHEXIDINE GLUCONATE 4 % EX SOLN
60.0000 mL | Freq: Once | CUTANEOUS | Status: DC
  Filled 2024-01-14: qty 60

## 2024-01-14 MED ORDER — CHLORHEXIDINE GLUCONATE CLOTH 2 % EX PADS: 6.0000 | MEDICATED_PAD | Freq: Every day | CUTANEOUS | Status: DC

## 2024-01-14 MED ORDER — MUPIROCIN 2 % EX OINT
TOPICAL_OINTMENT | Freq: Two times a day (BID) | CUTANEOUS | Status: AC
  Filled 2024-01-14: qty 22

## 2024-01-14 MED ORDER — TRANEXAMIC ACID-NACL 1000-0.7 MG/100ML-% IV SOLN
1000.0000 mg | INTRAVENOUS | Status: AC
Start: 2024-01-15 — End: 2024-01-16
  Administered 2024-01-15: 1000 mg via INTRAVENOUS
  Filled 2024-01-14 (×2): qty 100

## 2024-01-14 MED ORDER — CEFAZOLIN SODIUM-DEXTROSE 2-4 GM/100ML-% IV SOLN
2.0000 g | INTRAVENOUS | Status: AC
  Administered 2024-01-15: 2 g via INTRAVENOUS
  Filled 2024-01-14: qty 100

## 2024-01-14 MED ORDER — VANCOMYCIN HCL IN DEXTROSE 1-5 GM/200ML-% IV SOLN: 1000.0000 mg | INTRAVENOUS | Status: DC

## 2024-01-14 NOTE — Progress Notes (Signed)
 Ankle-brachial index completed. Please see CV Procedures for preliminary results.  Shona Simpson, RVT 01/14/24 9:18 AM

## 2024-01-14 NOTE — Progress Notes (Signed)
 Mood  Denim Kalmbach [facility]  NFA:[id number] DOB: July 29, 1951 DOA: 01/06/2024 PCP: Irven Coe, MD    Brief Narrative:  73 year old with a history of CHF, CAD, ESRD on HD, HTN, HLD, and DM2 who presented to the ER 3/10 with 2 to 3 days of fever and generalized weakness.  He has a chronic wound on his right foot and he had noted increased drainage from this wound.  In the ER an x-ray of his right foot was concerning for osteomyelitis.  Goals of Care:   Code Status: Full Code   DVT prophylaxis: heparin injection 5,000 Units Start: 01/07/24 0600 SCDs Start: 01/07/24 0018   Interim Hx: No acute events reported overnight.  Afebrile.  Vital signs stable.  Resting comfortably in bed at the time of visit.  Has no complaints.  Confirmed to me that he is ready to undergo his amputation which is scheduled for tomorrow.  Assessment & Plan:  Right diabetic foot infection - right heel ulceration Orthopedics evaluated and was concerned patient may require right BKA - patient/family understandably wanted to wait until his primary orthopedist, Dr. Lajoyce Corners could evaluate the wound - Dr. Lajoyce Corners saw the patient 3/17 and is recommending proceeding with right transtibial amputation on 3/19 -patient has agreed  ESRD on HD MWF Nephrology attending to inpatient dialysis - tolerating without complications  Antibiotic associated diarrhea No clinical symptoms to suggest C. difficile - continue probiotic and as needed Imodium - improved per patient report   DM2 CBG well-controlled - utilizes only as needed insulin at home  CAD Continue usual aspirin and Imdur -asymptomatic  Chronic Diastolic Heart Failure  Euvolemic on exam -volume management per dialysis  HLD Continue usual Lipitor dose  HTN Continue usual Coreg dose -blood pressure presently well-controlled  BPH Continue finasteride and Flomax   Family Communication: No family present at time of exam today Disposition: Will depend upon performance with  therapy after his amputation   Objective: Blood pressure 120/62, pulse 71, temperature (!) 97.5 F (36.4 C), temperature source Oral, resp. rate 16, height 6\' 3"  (1.905 m), weight 70.6 kg, SpO2 98%.  Intake/Output Summary (Last 24 hours) at 01/14/2024 1043 Last data filed at 01/14/2024 0114 Gross per 24 hour  Intake --  Output 2000 ml  Net -2000 ml   Filed Weights   01/10/24 0819 01/10/24 1238 01/13/24 1138  Weight: 77.1 kg 75.1 kg 70.6 kg    Examination: General: No acute respiratory distress Lungs: Clear to auscultation bilaterally  Cardiovascular: RRR Abdomen: NT/ND, soft, BS positive, no rebound Extremities: No significant change in exam of bilateral lower extremities as previously documented  CBC: Recent Labs  Lab 01/09/24 0427 01/10/24 0852  WBC 10.9* 10.1  HGB 10.2* 10.2*  HCT 32.5* 33.0*  MCV 91.3 90.9  PLT 158 158   Basic Metabolic Panel: Recent Labs  Lab 01/09/24 0427 01/10/24 0852 01/12/24 1834  NA 135 136 133*  K 3.5 3.1* 3.4*  CL 96* 98 91*  CO2 27 24 24   GLUCOSE 90 141* 118*  BUN 20 32* 34*  CREATININE 4.51* 6.52* 7.05*  CALCIUM 8.3* 8.5* 8.4*  MG 1.9  --   --   PHOS 3.6 4.7* 4.4   GFR: Estimated Creatinine Clearance: 9.2 mL/min (A) (by C-G formula based on SCr of 7.05 mg/dL (H)).   Scheduled Meds:  (feeding supplement) PROSource Plus  30 mL Oral BID BM   aspirin EC  81 mg Oral Daily   atorvastatin  80 mg Oral Daily   Chlorhexidine  Gluconate Cloth  6 each Topical Q0600   Chlorhexidine Gluconate Cloth  6 each Topical Q0600   doxercalciferol  2 mcg Intravenous Q M,W,F-HD   feeding supplement  237 mL Oral BID BM   finasteride  5 mg Oral Daily   gabapentin  300 mg Oral BID   heparin  5,000 Units Subcutaneous Q8H   insulin aspart  0-5 Units Subcutaneous QHS   insulin aspart  0-9 Units Subcutaneous TID WC   saccharomyces boulardii  250 mg Oral BID   sevelamer carbonate  1,600 mg Oral TID WC   tamsulosin  0.4 mg Oral QHS   Continuous  Infusions:  piperacillin-tazobactam (ZOSYN)  IV 2.25 g (01/14/24 1002)   vancomycin 750 mg (01/13/24 1445)     LOS: 5 days   Lonia Blood, MD [facility] Office  [phone] Pager - Text Page per Loretha Stapler  If 7PM-7AM, please contact night-coverage per Amion 01/14/2024, 10:43 AM

## 2024-01-14 NOTE — Discharge Instructions (Signed)
 To address social isolation:  Education officer, [employer] [place]: [phone] / [address], [place], [place] [address]  -Dial 988: Talk lifeline 24/7.  -[facility] Resource Network Warmline: [phone]  -[facility] (Adult Program)  - Address: [address] [place], [place] [address] - General office #:  [phone] - Enrollment Phone #: [phone]  -[facility]  - [facility] Line: call toll free, available 24 hours a day, at [phone]   [place] [address]

## 2024-01-14 NOTE — Progress Notes (Addendum)
 [name] Progress Note   Subjective:    Seen and examined patient at bedside. Tolerated yesterday's HD with net UF 2L. Plan for right transtibial amputation tomorrow. Plan to schedule dialysis around procedure.   Objective Vitals:   01/13/24 1537 01/13/24 1704 01/13/24 2101 01/14/24 0952  BP: (!) 146/60 106/83 (!) 119/54 120/62  Pulse: 78 73 73 71  Resp: 16 18 18 16   Temp:   98.5 F (36.9 C) (!) 97.5 F (36.4 C)  TempSrc:   Oral Oral  SpO2: 98% 99% 97% 98%  Weight:      Height:       Physical Exam General: Alert male in NAD Heart: RRR, no murmurs, rubs or gallops Lungs: CTA bilaterally, respirations unlabored on RA Abdomen: Soft, non-distended, +BS Extremities: L BKA, no edema appreciated Dialysis Access:  LUE AVF + bruit  Filed Weights   01/10/24 0819 01/10/24 1238 01/13/24 1138  Weight: 77.1 kg 75.1 kg 70.6 kg    Intake/Output Summary (Last 24 hours) at 01/14/2024 1444 Last data filed at 01/14/2024 0114 Gross per 24 hour  Intake --  Output 2000 ml  Net -2000 ml    Additional Objective Labs: Basic Metabolic Panel: Recent Labs  Lab 01/09/24 0427 01/10/24 0852 01/12/24 1834  NA 135 136 133*  K 3.5 3.1* 3.4*  CL 96* 98 91*  CO2 27 24 24   GLUCOSE 90 141* 118*  BUN 20 32* 34*  CREATININE 4.51* 6.52* 7.05*  CALCIUM 8.3* 8.5* 8.4*  PHOS 3.6 4.7* 4.4   Liver Function Tests: Recent Labs  Lab 01/09/24 0427 01/10/24 0852 01/12/24 1834  ALBUMIN 2.3* 2.3* 2.2*   No results for input(s): "LIPASE", "AMYLASE" in the last 168 hours. CBC: Recent Labs  Lab 01/09/24 0427 01/10/24 0852  WBC 10.9* 10.1  HGB 10.2* 10.2*  HCT 32.5* 33.0*  MCV 91.3 90.9  PLT 158 158   Blood Culture    Component Value Date/Time   SDES BLOOD RIGHT HAND 01/06/2024 2138   SPECREQUEST  01/06/2024 2138    BOTTLES DRAWN AEROBIC AND ANAEROBIC Blood Culture results may not be optimal due to an inadequate volume of blood received in culture bottles   CULT   01/06/2024 2138    NO GROWTH 5 DAYS Performed at [facility], [address] [place], [place] [address]    REPTSTATUS 01/11/2024 FINAL 01/06/2024 2138    Cardiac Enzymes: No results for input(s): "CKTOTAL", "CKMB", "CKMBINDEX", "TROPONINI" in the last 168 hours. CBG: Recent Labs  Lab 01/13/24 0732 01/13/24 1708 01/13/24 2058 01/14/24 0713 01/14/24 1058  GLUCAP 140* 98 128* 104* 139*   Iron Studies: No results for input(s): "IRON", "TIBC", "TRANSFERRIN", "FERRITIN" in the last 72 hours. Lab Results  Component Value Date   INR 1.3 (H) 10/19/2023   INR 1.4 (H) 05/20/2023   INR 1.4 (H) 03/21/2023   Studies/Results: VAS [place] ABI WITH/WO TBI Result Date: 01/14/2024  LOWER EXTREMITY DOPPLER STUDY Patient Name:  William Webb [facility]  Date of Exam:   01/14/2024 Medical Rec #: [id number]               Accession #:    [id number] Date of Birth: 1951-02-21               Patient Gender: M Patient Age:   73 years Exam Location:  [facility] Procedure:      VAS [place] ABI WITH/WO TBI Referring Phys: William Webb --------------------------------------------------------------------------------  Indications: Ulceration, and peripheral artery disease. left  AKA High Risk Factors: Hypertension, hyperlipidemia, Diabetes.  Comparison Study: Reduced arterial flow seen since previous exam 09/12/23. Performing Technologist: William Webb  Examination Guidelines: A complete evaluation includes at minimum, Doppler waveform signals and systolic blood pressure reading at the level of bilateral brachial, anterior tibial, and posterior tibial arteries, when vessel segments are accessible. Bilateral testing is considered an integral part of a complete examination. Photoelectric Plethysmograph (PPG) waveforms and toe systolic pressure readings are included as required and additional duplex testing as needed. Limited examinations for reoccurring indications may be performed as noted.  ABI Findings:  +---------+------------------+-----+-------------------+--------+ Right    Rt Pressure (mmHg)IndexWaveform           Comment  +---------+------------------+-----+-------------------+--------+ Brachial 126                    triphasic                   +---------+------------------+-----+-------------------+--------+ PTA                             absent                      +---------+------------------+-----+-------------------+--------+ DP       100               0.79 dampened monophasic         +---------+------------------+-----+-------------------+--------+ Great Toe69                0.55 Abnormal                    +---------+------------------+-----+-------------------+--------+ +-------+-----------+-----------+------------+------------+ ABI/TBIToday's ABIToday's TBIPrevious ABIPrevious TBI +-------+-----------+-----------+------------+------------+ Right  0.79       0.55       1.1         0.61         +-------+-----------+-----------+------------+------------+ Left   BKA        BKA        BKA         BKA          +-------+-----------+-----------+------------+------------+  Summary: Right: Resting right ankle-brachial index indicates moderate right lower extremity arterial disease. The right toe-brachial index is abnormal. *See table(s) above for measurements and observations.     Preliminary     Medications:  piperacillin-tazobactam (ZOSYN)  IV 2.25 g (01/14/24 1002)   vancomycin 750 mg (01/13/24 1445)    (feeding supplement) PROSource Plus  30 mL Oral BID BM   aspirin EC  81 mg Oral Daily   atorvastatin  80 mg Oral Daily   Chlorhexidine Gluconate Cloth  6 each Topical Q0600   Chlorhexidine Gluconate Cloth  6 each Topical Q0600   doxercalciferol  2 mcg Intravenous Q M,W,F-HD   feeding supplement  237 mL Oral BID BM   finasteride  5 mg Oral Daily   gabapentin  300 mg Oral BID   heparin  5,000 Units Subcutaneous Q8H   insulin aspart  0-5 Units  Subcutaneous QHS   insulin aspart  0-9 Units Subcutaneous TID WC   saccharomyces boulardii  250 mg Oral BID   sevelamer carbonate  1,600 mg Oral TID WC   tamsulosin  0.4 mg Oral QHS   [START ON 01/15/2024] tranexamic acid (CYKLOKAPRON) 2,000 mg in sodium chloride 0.9 % 50 mL Topical Application  2,000 mg Topical To OR    Dialysis Orders: AF MWF 4:00 425/800 EDW 80.4kg 2K/2Ca AVF No  heparin  Post HD wt 3/10 - 75.4 kg  -Mircera 225 q 2 weeks (last 3/3) -Hectorol 2 q HD  Assessment/Plan: Sepsis/R foot wound - possible oseto on MRI. On IV Vanc/Zosyn. Blood cultures ngtd. Orthopedics consulted - Plan for right transtibial amputation tomorrow. Per Ortho, likely will need inpatient rehab vs outpatient rehab. ESRD -  HD MWF. Continue on schedule. K+ running low, use higher potassium bath. Will schedule schedule dialysis around tomorrow's procedure (see above). Hypertension/volume  - BP ok. s/p IVFs. Appears euvolemic on exam. Now under EDW here. Plan to lower at discharge. Anemia  - Hgb 10-11. On ESA as outpatient, not due for next dose yet. Follow trends.  Metabolic bone disease -  Calcium acceptable. Phos at goal. Continue home meds  Nutrition - Renal diet, fluid restriction. Prot supp for low albumin   Salome Holmes, NP [facility] 01/14/2024,2:44 PM  LOS: 5 days

## 2024-01-14 NOTE — TOC Progression Note (Signed)
 Transition of Care [facility]) - Progression Note    Patient Details  Name: William Webb MRN: [id number] Date of Birth: 08-15-1951  [facility]) CM/SW Contact  Tom-Johnson, Hershal Coria, RN Phone Number: 01/14/2024, 2:59 PM  Clinical Narrative:     CM informed that patient declined SNF and requesting home health. CM spoke with patient at bedside about home health.  Patient states he is scheduled for sx tomorrow 01/15/24 and does not know the outcome as he lives alone. States he would like to go to rehab prior returning home.  CM updated patient's sister Eber Jones via phone [phone]) and agreed with patient going to rehab. LCSW and MD notified.   TOC will continue to follow as patient progresses with care towards discharge.          Expected Discharge Plan: Home w Home Health Services Barriers to Discharge: Continued Medical Work up  Expected Discharge Plan and Services In-house Referral: Clinical Social Work Discharge Planning Services: CM Consult Post Acute Care Choice: Home Health Living arrangements for the past 2 months: Apartment                                       Social Determinants of Health (SDOH) Interventions SDOH Screenings   Food Insecurity: No Food Insecurity (01/07/2024)  Housing: Low Risk  (01/07/2024)  Transportation Needs: No Transportation Needs (01/07/2024)  Utilities: Not At Risk (01/07/2024)  Depression (PHQ2-9): Low Risk  (10/07/2020)  Social Connections: Socially Isolated (01/07/2024)  Tobacco Use: Medium Risk (01/06/2024)    Readmission Risk Interventions    05/22/2023   11:05 AM 03/22/2023    4:41 PM 03/19/2022    3:43 PM  Readmission Risk Prevention Plan  Transportation Screening Complete Complete Complete  Medication Review (RN Care Manager) Referral to Pharmacy Complete Complete  PCP or Specialist appointment within 3-5 days of discharge Complete Complete Complete  HRI or Home Care Consult Complete  Complete Complete  SW Recovery Care/Counseling Consult Complete Complete Complete  Palliative Care Screening Not Applicable Not Applicable Not Applicable  Skilled Nursing Facility Not Applicable Not Applicable Not Applicable

## 2024-01-14 NOTE — TOC Initial Note (Addendum)
 Transition of Care [facility]) - Initial/Assessment Note    Patient Details  Name: William Webb MRN: [id number] Date of Birth: Apr 06, 1951  Transition of [facility]) CM/SW Contact:    Marliss Coots, LCSW Phone Number: 01/14/2024, 10:12 AM  Clinical Narrative:                  10:12 AM CSW introduced themself and role to patient at bedside. CSW informed patient of therapy recommendation of discharge to SNF. Patient declined recommendation and stated that he is to return home. CSW inquired about home health. Patient stated that he "maybe" interested in received home health services. Medical team made aware. CSW inquired about SDOH needs (social connections). Patient consented CSW to add resources to AVS. CSW added resources to AVS.  Expected Discharge Plan: Home w Home Health Services Barriers to Discharge: Continued Medical Work up   Patient Goals and CMS Choice Patient states their goals for this hospitalization and ongoing recovery are:: to go home          Expected Discharge Plan and Services In-house Referral: Clinical Social Work Discharge Planning Services: CM Consult Post Acute Care Choice: Home Health Living arrangements for the past 2 months: Apartment                                      Prior Living Arrangements/Services Living arrangements for the past 2 months: Apartment Lives with:: Self Patient language and need for interpreter reviewed:: Yes Do you feel safe going back to the place where you live?: Yes      Need for Family Participation in Patient Care: Yes (Comment) Care giver support system in place?: Yes (comment)   Criminal Activity/Legal Involvement Pertinent to Current Situation/Hospitalization: No - Comment as needed  Activities of Daily Living   ADL Screening (condition at time of admission) Independently performs ADLs?: No Does the patient have a NEW difficulty with bathing/dressing/toileting/self-feeding that is expected to last  >3 days?: No (needs assist) Does the patient have a NEW difficulty with getting in/out of bed, walking, or climbing stairs that is expected to last >3 days?: No (needs assist) Does the patient have a NEW difficulty with communication that is expected to last >3 days?: No Is the patient deaf or have difficulty hearing?: No Does the patient have difficulty seeing, even when wearing glasses/contacts?: No Does the patient have difficulty concentrating, remembering, or making decisions?: Yes  Permission Sought/Granted Permission sought to share information with : Family Supports, Oceanographer granted to share information with : No (Contact information on chart)  Share Information with NAME: Hydrologist  Permission granted to share info w AGENCY: HH  Permission granted to share info w Relationship: Nephew  Permission granted to share info w Contact Information: [phone]  Emotional Assessment Appearance:: Appears stated age Attitude/Demeanor/Rapport: Engaged Affect (typically observed): Appropriate, Accepting, Calm, Stable, Pleasant Orientation: : Oriented to Self, Oriented to Place, Oriented to  Time, Oriented to Situation Alcohol / Substance Use: Not Applicable Psych Involvement: No (comment)  Admission diagnosis:  Diabetic foot infection (HCC) [P29.518, L08.9] Osteomyelitis of right foot, unspecified type (HCC) [M86.9] Diabetic foot ulcer (HCC) [A41.660, L97.509] Patient Active Problem List   Diagnosis Date Noted   Gangrene of right foot (HCC) 01/13/2024   Diabetic foot ulcer (HCC) 01/09/2024   Critical limb ischemia of both lower extremities (HCC) 10/31/2023   Malnutrition of moderate degree 10/22/2023  COVID-19 virus infection 10/20/2023   Diabetic foot infection (HCC) 10/20/2023   Non-pressure chronic ulcer of right heel and midfoot limited to breakdown of skin (HCC) 10/20/2023   Nonrheumatic aortic valve stenosis 07/11/2023   Acute  osteomyelitis of left calcaneus (HCC) 06/04/2023   Pressure injury of skin 05/28/2023   FUO (fever of unknown origin) 05/21/2023   Leukocytosis 05/21/2023   History of CAD (coronary artery disease) 05/21/2023   Elevated temperature due to infection 05/21/2023   Pulmonary nodules 05/21/2023   Hemorrhoids 03/22/2023   Internal hemorrhoid 03/22/2023   Bright red blood per rectum 03/21/2023   Prolonged QT interval 03/21/2023   Precordial pain    Critical limb ischemia of right lower extremity (HCC)    Acute osteomyelitis of toe, right (HCC) 03/13/2022   Osteomyelitis (HCC) 03/13/2022   Elevated troponin    SIRS (systemic inflammatory response syndrome) (HCC) 03/12/2022   ESRD (end stage renal disease) (HCC) 10/19/2021   Community acquired pneumonia 02/01/2021   PNA (pneumonia) 02/01/2021   Hypoxia    Pleural effusion on right 12/02/2020   Pericardial effusion 11/02/2020   Loss of consciousness (HCC) 09/16/2020   Syncope and collapse 09/15/2020   CKD (chronic kidney disease), stage IV (HCC) 08/11/2020   Acute kidney injury superimposed on CKD (HCC) 07/31/2020   Hyperlipidemia associated with type 2 diabetes mellitus (HCC) 07/31/2020   Paroxysmal atrial fibrillation (HCC) 07/31/2020   Chronic disease anemia 07/31/2020   Nausea and vomiting 07/31/2020   PAD (peripheral artery disease) (HCC) 03/19/2020   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, limited to breakdown of skin (HCC) 01/08/2020   Status post vascular surgery 12/07/2019   Respiratory failure (HCC) 12/07/2019   Persistent atrial fibrillation (HCC) 07/10/2019   H/O: GI bleed 07/10/2019   CKD (chronic kidney disease) stage 5, GFR less than 15 ml/min (HCC) 06/08/2019   Erectile dysfunction 05/06/2019   Coronary artery disease involving native coronary artery of native heart without angina pectoris 04/05/2019   Acute on chronic respiratory failure with hypoxia (HCC) 12/10/2018   Healthcare-associated pneumonia 12/10/2018    Hemoptysis 12/10/2018   Sepsis (HCC) 12/10/2018   Acute respiratory failure with hypoxia (HCC) 12/10/2018   Severe protein-calorie malnutrition (HCC) 12/02/2018   CAD (coronary artery disease) 12/01/2018   Syncope 12/01/2018   DOE (dyspnea on exertion) 11/18/2018   Hypertension associated with diabetes (HCC)    Hyperlipidemia    Diabetes mellitus with renal complications (HCC)    Noncompliance with medication regimen    Chronic pain syndrome 05/29/2018   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 [place] [address] Phone: [phone] Fax: [phone]     Social Drivers of Health (SDOH) Social History: SDOH Screenings   Food Insecurity: No Food Insecurity (01/07/2024)  Housing: Low Risk  (01/07/2024)  Transportation Needs: No Transportation Needs (01/07/2024)  Utilities: Not At Risk (01/07/2024)  Depression (PHQ2-9): Low Risk  (10/07/2020)  Social Connections: Socially Isolated (01/07/2024)  Tobacco Use: Medium Risk (01/06/2024)   SDOH Interventions: Transportation Interventions: Inpatient TOC   Readmission Risk Interventions  05/22/2023   11:05 AM 03/22/2023    4:41 PM 03/19/2022    3:43 PM  Readmission Risk Prevention Plan  Transportation Screening Complete Complete Complete  Medication Review (RN Care Manager) Referral to Pharmacy Complete Complete  PCP or Specialist appointment within 3-5 days of discharge Complete Complete Complete  HRI or Home Care Consult Complete Complete Complete  SW  Recovery Care/Counseling Consult Complete Complete Complete  Palliative Care Screening Not Applicable Not Applicable Not Applicable  Skilled Nursing Facility Not Applicable Not Applicable Not Applicable

## 2024-01-14 NOTE — Progress Notes (Signed)
 PT Cancellation Note  Patient Details Name: William Webb [facility] MRN: [id number] DOB: 05/05/1951   Cancelled Treatment:    Reason Eval/Treat Not Completed: Patient declined, no reason specified; patient eating and reports "no use, they are gonna take my other leg tomorrow".  Denies feeling sorry for himself, but expresses frustration with multiple people in and out all day today.  Encouraged mobility but pt continued to decline today, though understands he will need therapy post procedure.  Obtained tubing grip for his utensils as reported having difficulty with weakness in his hands.  PT will follow up post surgery.    William Webb 01/14/2024, 12:39 PM Sheran Lawless, PT Acute Rehabilitation Services [phone] 01/14/2024

## 2024-01-15 ENCOUNTER — Encounter (HOSPITAL_COMMUNITY)
Admission: EM | Disposition: A | Discharge status: Skilled Nursing Facility | Payer: Self-pay | Source: Home / Self Care | Attending: Internal Medicine

## 2024-01-15 ENCOUNTER — Encounter (HOSPITAL_COMMUNITY): Payer: Self-pay | Admitting: Internal Medicine

## 2024-01-15 ENCOUNTER — Inpatient Hospital Stay (HOSPITAL_COMMUNITY): Admitting: Anesthesiology

## 2024-01-15 ENCOUNTER — Other Ambulatory Visit: Payer: Self-pay

## 2024-01-15 DIAGNOSIS — I96 Gangrene, not elsewhere classified: Secondary | ICD-10-CM | POA: Diagnosis not present

## 2024-01-15 DIAGNOSIS — E11621 Type 2 diabetes mellitus with foot ulcer: Secondary | ICD-10-CM

## 2024-01-15 DIAGNOSIS — N186 End stage renal disease: Secondary | ICD-10-CM

## 2024-01-15 DIAGNOSIS — E11628 Type 2 diabetes mellitus with other skin complications: Secondary | ICD-10-CM | POA: Diagnosis not present

## 2024-01-15 DIAGNOSIS — I132 Hypertensive heart and chronic kidney disease with heart failure and with stage 5 chronic kidney disease, or end stage renal disease: Secondary | ICD-10-CM

## 2024-01-15 DIAGNOSIS — L97414 Non-pressure chronic ulcer of right heel and midfoot with necrosis of bone: Secondary | ICD-10-CM

## 2024-01-15 DIAGNOSIS — E1122 Type 2 diabetes mellitus with diabetic chronic kidney disease: Secondary | ICD-10-CM

## 2024-01-15 DIAGNOSIS — L089 Local infection of the skin and subcutaneous tissue, unspecified: Secondary | ICD-10-CM | POA: Diagnosis not present

## 2024-01-15 DIAGNOSIS — I509 Heart failure, unspecified: Secondary | ICD-10-CM

## 2024-01-15 HISTORY — PX: AMPUTATION: SHX166

## 2024-01-15 LAB — POCT I-STAT, CHEM 8
BUN: 17 mg/dL (ref 8–23)
Calcium, Ion: 1.02 mmol/L — ABNORMAL LOW (ref 1.15–1.40)
Chloride: 93 mmol/L — ABNORMAL LOW (ref 98–111)
Creatinine, Ser: 4.1 mg/dL — ABNORMAL HIGH (ref 0.61–1.24)
Glucose, Bld: 186 mg/dL — ABNORMAL HIGH (ref 70–99)
HCT: 43 % (ref 39.0–52.0)
Hemoglobin: 14.6 g/dL (ref 13.0–17.0)
Potassium: 3.2 mmol/L — ABNORMAL LOW (ref 3.5–5.1)
Sodium: 133 mmol/L — ABNORMAL LOW (ref 135–145)
TCO2: 28 mmol/L (ref 22–32)

## 2024-01-15 LAB — GLUCOSE, CAPILLARY
Glucose-Capillary: 70 mg/dL (ref 70–99)
Glucose-Capillary: 135 mg/dL — ABNORMAL HIGH (ref 70–99)
Glucose-Capillary: 131 mg/dL — ABNORMAL HIGH (ref 70–99)
Glucose-Capillary: 127 mg/dL — ABNORMAL HIGH (ref 70–99)
Glucose-Capillary: 165 mg/dL — ABNORMAL HIGH (ref 70–99)

## 2024-01-15 LAB — RENAL FUNCTION PANEL
Albumin: 2.1 g/dL — ABNORMAL LOW (ref 3.5–5.0)
Anion gap: 16 — ABNORMAL HIGH (ref 5–15)
BUN: 35 mg/dL — ABNORMAL HIGH (ref 8–23)
CO2: 25 mmol/L (ref 22–32)
Calcium: 8.9 mg/dL (ref 8.9–10.3)
Chloride: 92 mmol/L — ABNORMAL LOW (ref 98–111)
Creatinine, Ser: 6.99 mg/dL — ABNORMAL HIGH (ref 0.61–1.24)
GFR, Estimated: 8 mL/min — ABNORMAL LOW (ref >60)
Glucose, Bld: 108 mg/dL — ABNORMAL HIGH (ref 70–99)
Phosphorus: 5.7 mg/dL — ABNORMAL HIGH (ref 2.5–4.6)
Potassium: 3 mmol/L — ABNORMAL LOW (ref 3.5–5.1)
Sodium: 133 mmol/L — ABNORMAL LOW (ref 135–145)

## 2024-01-15 LAB — CBC WITH DIFFERENTIAL/PLATELET
Abs Immature Granulocytes: 0.05 10*3/uL (ref 0.00–0.07)
Basophils Absolute: 0 10*3/uL (ref 0.0–0.1)
Basophils Relative: 0 %
Eosinophils Absolute: 0.3 10*3/uL (ref 0.0–0.5)
Eosinophils Relative: 2 %
HCT: 37.3 % — ABNORMAL LOW (ref 39.0–52.0)
Hemoglobin: 11.8 g/dL — ABNORMAL LOW (ref 13.0–17.0)
Immature Granulocytes: 0 %
Lymphocytes Relative: 7 %
Lymphs Abs: 0.8 10*3/uL (ref 0.7–4.0)
MCH: 28.1 pg (ref 26.0–34.0)
MCHC: 31.6 g/dL (ref 30.0–36.0)
MCV: 88.8 fL (ref 80.0–100.0)
Monocytes Absolute: 0.8 10*3/uL (ref 0.1–1.0)
Monocytes Relative: 7 %
Neutro Abs: 9.5 10*3/uL — ABNORMAL HIGH (ref 1.7–7.7)
Neutrophils Relative %: 84 %
Platelets: 283 10*3/uL (ref 150–400)
RBC: 4.2 MIL/uL — ABNORMAL LOW (ref 4.22–5.81)
RDW: 15.1 % (ref 11.5–15.5)
WBC: 11.4 10*3/uL — ABNORMAL HIGH (ref 4.0–10.5)
nRBC: 0 % (ref 0.0–0.2)

## 2024-01-15 SURGERY — AMPUTATION BELOW KNEE
Anesthesia: Regional | Site: Knee | Laterality: Right

## 2024-01-15 MED ORDER — LIDOCAINE 2% (20 MG/ML) 5 ML SYRINGE
INTRAMUSCULAR | Status: DC | PRN
Start: 2024-01-15 — End: 2024-01-15
  Administered 2024-01-15: 40 mg via INTRAVENOUS

## 2024-01-15 MED ORDER — CHLORHEXIDINE GLUCONATE 0.12 % MT SOLN
15.0000 mL | Freq: Once | OROMUCOSAL | Status: AC
  Administered 2024-01-15: 15 mL via OROMUCOSAL

## 2024-01-15 MED ORDER — DEXTROSE 50 % IV SOLN
INTRAVENOUS | Status: AC
  Administered 2024-01-15: 50 mL
  Filled 2024-01-15: qty 50

## 2024-01-15 MED ORDER — BUPIVACAINE HCL (PF) 0.5 % IJ SOLN
INTRAMUSCULAR | Status: DC | PRN
  Administered 2024-01-15: 10 mL via PERINEURAL

## 2024-01-15 MED ORDER — SODIUM CHLORIDE 0.9 % IV SOLN: INTRAVENOUS | Status: DC

## 2024-01-15 MED ORDER — JUVEN PO PACK
1.0000 | PACK | Freq: Two times a day (BID) | ORAL | Status: DC
  Administered 2024-01-16 – 2024-01-21 (×9): 1 via ORAL
  Filled 2024-01-15 (×9): qty 1

## 2024-01-15 MED ORDER — VANCOMYCIN HCL 1000 MG IV SOLR
INTRAVENOUS | Status: AC
  Filled 2024-01-15: qty 20

## 2024-01-15 MED ORDER — ACETAMINOPHEN 325 MG PO TABS: 325.0000 mg | ORAL_TABLET | ORAL | Status: DC | PRN

## 2024-01-15 MED ORDER — ORAL CARE MOUTH RINSE: 15.0000 mL | Freq: Once | OROMUCOSAL | Status: AC

## 2024-01-15 MED ORDER — ROPIVACAINE HCL 5 MG/ML IJ SOLN
INTRAMUSCULAR | Status: DC | PRN
  Administered 2024-01-15: 15 mL via PERINEURAL

## 2024-01-15 MED ORDER — LIDOCAINE 2% (20 MG/ML) 5 ML SYRINGE
INTRAMUSCULAR | Status: AC
  Filled 2024-01-15: qty 5

## 2024-01-15 MED ORDER — BUPIVACAINE LIPOSOME 1.3 % IJ SUSP
INTRAMUSCULAR | Status: DC | PRN
  Administered 2024-01-15: 10 mL via PERINEURAL

## 2024-01-15 MED ORDER — POTASSIUM CHLORIDE 20 MEQ PO PACK
40.0000 meq | PACK | ORAL | Status: AC
  Administered 2024-01-15: 40 meq via ORAL
  Filled 2024-01-15 (×2): qty 2

## 2024-01-15 MED ORDER — PROPOFOL 500 MG/50ML IV EMUL
INTRAVENOUS | Status: DC | PRN
  Administered 2024-01-15: 50 ug/kg/min via INTRAVENOUS

## 2024-01-15 MED ORDER — FENTANYL CITRATE (PF) 100 MCG/2ML IJ SOLN: 25.0000 ug | INTRAMUSCULAR | Status: DC | PRN

## 2024-01-15 MED ORDER — FENTANYL CITRATE (PF) 100 MCG/2ML IJ SOLN
INTRAMUSCULAR | Status: AC
  Administered 2024-01-15: 100 ug via INTRAVENOUS
  Filled 2024-01-15: qty 2

## 2024-01-15 MED ORDER — FENTANYL CITRATE (PF) 100 MCG/2ML IJ SOLN: 100.0000 ug | Freq: Once | INTRAMUSCULAR | Status: AC

## 2024-01-15 MED ORDER — ONDANSETRON HCL 4 MG/2ML IJ SOLN
INTRAMUSCULAR | Status: DC | PRN
  Administered 2024-01-15: 4 mg via INTRAVENOUS

## 2024-01-15 MED ORDER — ACETAMINOPHEN 160 MG/5ML PO SOLN: 325.0000 mg | ORAL | Status: DC | PRN

## 2024-01-15 MED ORDER — OXYCODONE HCL 5 MG PO TABS: 5.0000 mg | ORAL_TABLET | Freq: Once | ORAL | Status: DC | PRN

## 2024-01-15 MED ORDER — VITAMIN C 500 MG PO TABS
1000.0000 mg | ORAL_TABLET | Freq: Every day | ORAL | Status: DC
  Administered 2024-01-15 – 2024-01-22 (×7): 1000 mg via ORAL
  Filled 2024-01-15 (×7): qty 2

## 2024-01-15 MED ORDER — ZINC SULFATE 220 (50 ZN) MG PO CAPS
220.0000 mg | ORAL_CAPSULE | Freq: Every day | ORAL | Status: DC
  Administered 2024-01-15 – 2024-01-22 (×7): 220 mg via ORAL
  Filled 2024-01-15 (×7): qty 1

## 2024-01-15 MED ORDER — OXYCODONE HCL 5 MG/5ML PO SOLN: 5.0000 mg | Freq: Once | ORAL | Status: DC | PRN

## 2024-01-15 MED ORDER — MIDAZOLAM HCL 2 MG/2ML IJ SOLN
INTRAMUSCULAR | Status: AC
  Filled 2024-01-15: qty 2

## 2024-01-15 MED ORDER — MEPERIDINE HCL 25 MG/ML IJ SOLN: 6.2500 mg | INTRAMUSCULAR | Status: DC | PRN

## 2024-01-15 SURGICAL SUPPLY — 35 items
BAG COUNTER SPONGE SURGICOUNT (BAG) IMPLANT
BLADE SAW RECIP 87.9 MT (BLADE) ×1 IMPLANT
BLADE SURG 21 STRL SS (BLADE) ×1 IMPLANT
BNDG COHESIVE 6X5 TAN ST LF (GAUZE/BANDAGES/DRESSINGS) IMPLANT
CANISTER WOUND CARE 500ML ATS (WOUND CARE) ×1 IMPLANT
COVER SURGICAL LIGHT HANDLE (MISCELLANEOUS) ×1 IMPLANT
CUFF TRNQT CYL 34X4.125X (TOURNIQUET CUFF) ×1 IMPLANT
DRAPE DERMATAC (DRAPES) IMPLANT
DRAPE INCISE IOBAN 66X45 STRL (DRAPES) ×1 IMPLANT
DRAPE U-SHAPE 47X51 STRL (DRAPES) ×1 IMPLANT
DRESSING PREVENA PLUS CUSTOM (GAUZE/BANDAGES/DRESSINGS) ×1 IMPLANT
DRSG PREVENA PLUS CUSTOM (GAUZE/BANDAGES/DRESSINGS) ×1 IMPLANT
DURAPREP 26ML APPLICATOR (WOUND CARE) ×1 IMPLANT
ELECT REM PT RETURN 9FT ADLT (ELECTROSURGICAL) ×1 IMPLANT
ELECTRODE REM PT RTRN 9FT ADLT (ELECTROSURGICAL) ×1 IMPLANT
GLOVE BIOGEL PI IND STRL 9 (GLOVE) ×1 IMPLANT
GLOVE SURG ORTHO 9.0 STRL STRW (GLOVE) ×1 IMPLANT
GOWN STRL REUS W/ TWL XL LVL3 (GOWN DISPOSABLE) ×2 IMPLANT
GRAFT SKIN WND MICRO 38 (Tissue) IMPLANT
KIT BASIN OR (CUSTOM PROCEDURE TRAY) ×1 IMPLANT
KIT TURNOVER KIT B (KITS) ×1 IMPLANT
MANIFOLD NEPTUNE II (INSTRUMENTS) ×1 IMPLANT
NS IRRIG 1000ML POUR BTL (IV SOLUTION) ×1 IMPLANT
PACK ORTHO EXTREMITY (CUSTOM PROCEDURE TRAY) ×1 IMPLANT
PAD ARMBOARD POSITIONER FOAM (MISCELLANEOUS) ×1 IMPLANT
PREVENA RESTOR ARTHOFORM 46X30 (CANNISTER) ×1 IMPLANT
SPONGE T-LAP 18X18 ~~LOC~~+RFID (SPONGE) IMPLANT
STAPLER VISISTAT 35W (STAPLE) IMPLANT
STOCKINETTE IMPERVIOUS LG (DRAPES) ×1 IMPLANT
SUT ETHILON 2 0 PSLX (SUTURE) IMPLANT
SUT SILK 2-0 18XBRD TIE 12 (SUTURE) ×1 IMPLANT
SUT VIC AB 1 CTX 27 (SUTURE) ×2 IMPLANT
TOWEL GREEN STERILE (TOWEL DISPOSABLE) ×1 IMPLANT
TUBE CONNECTING 12X1/4 (SUCTIONS) ×1 IMPLANT
YANKAUER SUCT BULB TIP NO VENT (SUCTIONS) ×1 IMPLANT

## 2024-01-15 NOTE — Progress Notes (Signed)
 [facility] Progress Note   Subjective:    Informed ortho surgery scheduled for this afternoon; thus, on HD now. Patient reports feeling down given upcoming procedure. No SOB/CP. Tolerating UFG 2L. Ordered to keep SBP > 100 during treatment.  Objective Vitals:   01/15/24 1035 01/15/24 1040 01/15/24 1054 01/15/24 1100  BP: (!) 126/106 (!) 81/42 131/69 131/69  Pulse: 83   (!) 45  Resp: 13 16 17 15   Temp:      TempSrc:      SpO2:    (!) 84%  Weight:      Height:       Physical Exam General: Alert male in NAD Heart: RRR, no murmurs, rubs or gallops Lungs: CTA bilaterally, respirations unlabored on RA Abdomen: Soft, non-distended, +BS Extremities: L BKA, no edema appreciated Dialysis Access:  LUE AVF + bruit  Filed Weights   01/10/24 1238 01/13/24 1138 01/15/24 0749  Weight: 75.1 kg 70.6 kg 69.7 kg    Intake/Output Summary (Last 24 hours) at 01/15/2024 1127 Last data filed at 01/15/2024 0600 Gross per 24 hour  Intake 1036.04 ml  Output 0 ml  Net 1036.04 ml    Additional Objective Labs: Basic Metabolic Panel: Recent Labs  Lab 01/10/24 0852 01/12/24 1834 01/15/24 0802  NA 136 133* 133*  K 3.1* 3.4* 3.0*  CL 98 91* 92*  CO2 24 24 25   GLUCOSE 141* 118* 108*  BUN 32* 34* 35*  CREATININE 6.52* 7.05* 6.99*  CALCIUM 8.5* 8.4* 8.9  PHOS 4.7* 4.4 5.7*   Liver Function Tests: Recent Labs  Lab 01/10/24 0852 01/12/24 1834 01/15/24 0802  ALBUMIN 2.3* 2.2* 2.1*   No results for input(s): "LIPASE", "AMYLASE" in the last 168 hours. CBC: Recent Labs  Lab 01/09/24 0427 01/10/24 0852 01/15/24 0500  WBC 10.9* 10.1 11.4*  NEUTROABS  --   --  9.5*  HGB 10.2* 10.2* 11.8*  HCT 32.5* 33.0* 37.3*  MCV 91.3 90.9 88.8  PLT 158 158 283   Blood Culture    Component Value Date/Time   SDES BLOOD RIGHT HAND 01/06/2024 2138   SPECREQUEST  01/06/2024 2138    BOTTLES DRAWN AEROBIC AND ANAEROBIC Blood Culture results may not be optimal due to an inadequate  volume of blood received in culture bottles   CULT  01/06/2024 2138    NO GROWTH 5 DAYS Performed at [facility], [address], [place], [place] [address]    REPTSTATUS 01/11/2024 FINAL 01/06/2024 2138    Cardiac Enzymes: No results for input(s): "CKTOTAL", "CKMB", "CKMBINDEX", "TROPONINI" in the last 168 hours. CBG: Recent Labs  Lab 01/13/24 2058 01/14/24 0713 01/14/24 1058 01/14/24 1708 01/14/24 2021  GLUCAP 128* 104* 139* 129* 98   Iron Studies: No results for input(s): "IRON", "TIBC", "TRANSFERRIN", "FERRITIN" in the last 72 hours. Lab Results  Component Value Date   INR 1.3 (H) 10/19/2023   INR 1.4 (H) 05/20/2023   INR 1.4 (H) 03/21/2023   Studies/Results: VAS [place] ABI WITH/WO TBI Result Date: 01/14/2024  LOWER EXTREMITY DOPPLER STUDY Patient Name:  William Webb [facility]  Date of Exam:   01/14/2024 Medical Rec #: [id number]               Accession #:    [id number] Date of Birth: [birth year]               Patient Gender: M Patient Age:   73 years Exam Location:  [facility] Procedure:      VAS  [place] ABI WITH/WO TBI Referring Phys: ROBERT DORRELL --------------------------------------------------------------------------------  Indications: Ulceration, and peripheral artery disease. left AKA High Risk Factors: Hypertension, hyperlipidemia, Diabetes.  Comparison Study: Reduced arterial flow seen since previous exam 09/12/23. Performing Technologist: Shona Simpson  Examination Guidelines: A complete evaluation includes at minimum, Doppler waveform signals and systolic blood pressure reading at the level of bilateral brachial, anterior tibial, and posterior tibial arteries, when vessel segments are accessible. Bilateral testing is considered an integral part of a complete examination. Photoelectric Plethysmograph (PPG) waveforms and toe systolic pressure readings are included as required and additional duplex testing as needed. Limited examinations for reoccurring indications may  be performed as noted.  ABI Findings: +---------+------------------+-----+-------------------+--------+ Right    Rt Pressure (mmHg)IndexWaveform           Comment  +---------+------------------+-----+-------------------+--------+ Brachial 126                    triphasic                   +---------+------------------+-----+-------------------+--------+ PTA                             absent                      +---------+------------------+-----+-------------------+--------+ DP       100               0.79 dampened monophasic         +---------+------------------+-----+-------------------+--------+ Great Toe69                0.55 Abnormal                    +---------+------------------+-----+-------------------+--------+ +-------+-----------+-----------+------------+------------+ ABI/TBIToday's ABIToday's TBIPrevious ABIPrevious TBI +-------+-----------+-----------+------------+------------+ Right  0.79       0.55       1.1         0.61         +-------+-----------+-----------+------------+------------+ Left   BKA        BKA        BKA         BKA          +-------+-----------+-----------+------------+------------+  Summary: Right: Resting right ankle-brachial index indicates moderate right lower extremity arterial disease. The right toe-brachial index is abnormal. *See table(s) above for measurements and observations.  Electronically signed by Lemar Livings MD on 01/14/2024 at 3:53:25 PM.    Final     Medications:   ceFAZolin (ANCEF) IV     piperacillin-tazobactam (ZOSYN)  IV 2.25 g (01/15/24 0139)   tranexamic acid     vancomycin 750 mg (01/15/24 1050)    (feeding supplement) PROSource Plus  30 mL Oral BID BM   aspirin EC  81 mg Oral Daily   atorvastatin  80 mg Oral Daily   chlorhexidine  60 mL Topical Once   Chlorhexidine Gluconate Cloth  6 each Topical Q0600   doxercalciferol  2 mcg Intravenous Q M,W,F-HD   feeding supplement  237 mL Oral BID BM    finasteride  5 mg Oral Daily   gabapentin  300 mg Oral BID   heparin  5,000 Units Subcutaneous Q8H   insulin aspart  0-5 Units Subcutaneous QHS   insulin aspart  0-9 Units Subcutaneous TID WC   mupirocin ointment   Nasal BID   povidone-iodine  2 Application Topical Once   saccharomyces boulardii  250 mg Oral BID  sevelamer carbonate  1,600 mg Oral TID WC   tamsulosin  0.4 mg Oral QHS   tranexamic acid (CYKLOKAPRON) 2,000 mg in sodium chloride 0.9 % 50 mL Topical Application  2,000 mg Topical To OR    Dialysis Orders: AF MWF 4:00 425/800 EDW 80.4kg 2K/2Ca AVF No heparin  Post HD wt 3/10 - 75.4 kg  -Mircera 225 q 2 weeks (last 3/3) -Hectorol 2 q HD  Assessment/Plan: Sepsis/R foot wound - possible oseto on MRI. On IV Vanc/Zosyn. Blood cultures ngtd. Orthopedics consulted - Plan for right transtibial amputation today. Per Ortho, likely will need inpatient rehab vs outpatient rehab. ESRD -  HD MWF. Continue on schedule. K+ running low, use higher potassium bath. Informed ortho surgery scheduled for this afternoon; thus, on HD now. Hypertension/volume  - BP ok. s/p IVFs. Appears euvolemic on exam. Now under EDW here. Plan to lower at discharge. Anemia  - Hgb 10-11. On ESA as outpatient, not due for next dose yet. Follow trends.  Metabolic bone disease -  Calcium acceptable. Phos at goal. Continue home meds  Nutrition - Renal diet, fluid restriction. Prot supp for low albumin   Salome Holmes, NP [facility] 01/15/2024,11:27 AM  LOS: 6 days

## 2024-01-15 NOTE — Op Note (Signed)
 01/06/2024 - 01/15/2024  3:54 PM  PATIENT:  William Webb    PRE-OPERATIVE DIAGNOSIS:  Gangrene Right Foot  POST-OPERATIVE DIAGNOSIS:  Same  PROCEDURE:  AMPUTATION BELOW KNEE Application of Kerecis micro graft 38 cm  Application of Prevena customizable and Prevena arthroform wound VAC dressings Application of Vive Wear stump shrinker and the Hanger limb protector  SURGEON:  [name], MD  ANESTHESIA:   General  PREOPERATIVE INDICATIONS:  William Webb is a  73 y.o. male with a diagnosis of Gangrene Right Foot who failed conservative measures and elected for surgical management.    The risks benefits and alternatives were discussed with the patient preoperatively including but not limited to the risks of infection, bleeding, nerve injury, cardiopulmonary complications, the need for revision surgery, among others, and the patient was willing to proceed.  OPERATIVE IMPLANTS:   Implant Name Type Inv. Item Serial No. Manufacturer Lot No. LRB No. Used Action  GRAFT SKIN WND MICRO 38 - [id number] Tissue GRAFT SKIN WND MICRO 38  [employer] [id number] X Right 1 Implanted     OPERATIVE FINDINGS: Arteries were calcified.  Patient had minimal petechial bleeding.  Tissue margins were clear.  OPERATIVE PROCEDURE: Patient was brought to the operating room after undergoing a regional anesthetic.  After adequate levels anesthesia were obtained a thigh tourniquet was placed and the lower extremity was prepped using DuraPrep draped into a sterile field. The foot was draped out of the sterile field with impervious stockinette.  A timeout was called and the tourniquet inflated.  A transverse skin incision was made 12 cm distal to the tibial tubercle, the incision curved proximally, and a large posterior flap was created.  The tibia was transected just proximal to the skin incision and beveled anteriorly.  The fibula was transected just proximal to the tibial incision.  The sciatic  nerve was pulled cut and allowed to retract.  The vascular bundles were suture ligated with 2-0 silk.  The tourniquet was deflated and hemostasis obtained.      The Kerecis micro powder 38 cm was applied to the open wound that has a 200 cm surface area.   The deep and superficial fascial layers were closed using #1 Vicryl.  The skin was closed using staples.    The Prevena customizable dressing was applied this was overwrapped with the arthroform sponge.  William Webb was used to secure the sponges and the circumferential compression was secured to the skin with Dermatac.  This was connected to the wound VAC pump and had a good suction fit this was covered with a stump shrinker and a limb protector.  Patient was taken to the PACU in stable condition.   DISCHARGE PLANNING:  Antibiotic duration: 24-hour antibiotics  Weightbearing: Nonweightbearing on the operative extremity  Pain medication: Opioid pathway  Dressing care/ Wound VAC: Continue wound VAC with the Prevena plus pump at discharge for 1 week  Ambulatory devices: Walker or kneeling scooter  Discharge to: Discharge planning based on recommendations per physical therapy  Follow-up: In the office 1 week after discharge.

## 2024-01-15 NOTE — Transfer of Care (Signed)
 Immediate Anesthesia Transfer of Care Note  Patient: William Webb [facility]  Procedure(s) Performed: AMPUTATION BELOW KNEE (Right: Knee)  Patient Location: PACU  Anesthesia Type:MAC  Level of Consciousness: awake, alert , and oriented  Airway & Oxygen Therapy: Patient Spontanous Breathing  Post-op Assessment: Report given to RN and Post -op Vital signs reviewed and stable  Post vital signs: Reviewed and stable  Last Vitals:  Vitals Value Taken Time  BP 110/61 01/15/24 1607  Temp    Pulse 76 01/15/24 1611  Resp 13 01/15/24 1611  SpO2 98 % 01/15/24 1611  Vitals shown include unfiled device data.  Last Pain:  Vitals:   01/15/24 1335  TempSrc:   PainSc: 0-No pain         Complications: No notable events documented.

## 2024-01-15 NOTE — Interval H&P Note (Signed)
 History and Physical Interval Note:  01/15/2024 6:49 AM  William Webb  has presented today for surgery, with the diagnosis of Gangrene Right Foot.  The various methods of treatment have been discussed with the patient and family. After consideration of risks, benefits and other options for treatment, the patient has consented to  Procedure(s): AMPUTATION BELOW KNEE (Right) as a surgical intervention.  The patient's history has been reviewed, patient examined, no change in status, stable for surgery.  I have reviewed the patient's chart and labs.  Questions were answered to the patient's satisfaction.     [name]

## 2024-01-15 NOTE — Anesthesia Procedure Notes (Addendum)
 Anesthesia Regional Block: Popliteal block   Pre-Anesthetic Checklist: , timeout performed,  Correct Patient, Correct Site, Correct Laterality,  Correct Procedure, Correct Position, site marked,  Risks and benefits discussed,  Surgical consent,  Pre-op evaluation,  At surgeon's request and post-op pain management  Laterality: Right  Prep: chloraprep       Needles:  Injection technique: Single-shot  Needle Type: Echogenic Stimulator Needle     Needle Length: 5cm  Needle Gauge: 22     Additional Needles:   Procedures:, nerve stimulator,,, ultrasound used (permanent image in chart),,     Nerve Stimulator or Paresthesia:  Response: quadraceps contraction, 0.45 mA  Additional Responses:   Narrative:  Start time: 01/15/2024 2:35 PM End time: 12/18/2023 2:40 PM Injection made incrementally with aspirations every 5 mL.  Performed by: Personally  Anesthesiologist: Bethena Midget, MD  Additional Notes: Functioning IV was confirmed and monitors were applied.  A 50mm 22ga Arrow echogenic stimulator needle was used. Sterile prep and drape,hand hygiene and sterile gloves were used. Ultrasound guidance: relevant anatomy identified, needle position confirmed, local anesthetic spread visualized around nerve(s)., vascular puncture avoided.  Image printed for medical record. Negative aspiration and negative test dose prior to incremental administration of local anesthetic. The patient tolerated the procedure well.

## 2024-01-15 NOTE — Anesthesia Procedure Notes (Addendum)
 Anesthesia Regional Block: Adductor canal block   Pre-Anesthetic Checklist: , timeout performed,  Correct Patient, Correct Site, Correct Laterality,  Correct Procedure, Correct Position, site marked,  Risks and benefits discussed,  Surgical consent,  Pre-op evaluation,  At surgeon's request and post-op pain management  Laterality: Right  Prep: chloraprep       Needles:  Injection technique: Single-shot  Needle Type: Echogenic Stimulator Needle     Needle Length: 5cm  Needle Gauge: 22     Additional Needles:   Narrative:  Start time: 01/15/2024 2:35 PM End time: 01/15/2024 2:40 PM Injection made incrementally with aspirations every 5 mL.  Performed by: Personally  Anesthesiologist: Bethena Midget, MD  Additional Notes: Functioning IV was confirmed and monitors were applied.  A 50mm 22ga Arrow echogenic stimulator needle was used. Sterile prep and drape,hand hygiene and sterile gloves were used. Ultrasound guidance: relevant anatomy identified, needle position confirmed, local anesthetic spread visualized around nerve(s)., vascular puncture avoided.  Image printed for medical record. Negative aspiration and negative test dose prior to incremental administration of local anesthetic. The patient tolerated the procedure well.

## 2024-01-15 NOTE — Progress Notes (Signed)
 PROGRESS NOTE  Ricky Gallery [facility]  DOB: 02-May-1951  PCP: Irven Coe, MD VHQ:[id number]  DOA: 01/06/2024  LOS: 6 days  Hospital Day: 10  Brief narrative: William Webb is a 73 y.o. male with PMH significant for ESRD-HD-MWF, DM2, HTN, HLD, CAD, CHF, PAD, diabetic neuropathy s/p prior left BKA.  He also has a right foot wound that continue to worsen despite endovascular intervention in January. 3/10, patient presented to the ED with complaint of 2 to 3 days of fever, generalized weakness and increased drainage from chronic right foot wound X-ray of right foot was concerning for osteomyelitis. MRI right foot also suggested osteomyelitis  Subjective: Patient was seen and examined this morning in dialysis. Elderly African-American male. Not in distress.  Undergoing dialysis.  Assessment and plan: Right diabetic foot infection - right heel ulceration Orthopedics evaluated and was concerned patient may require right BKA - patient/family understandably wanted to wait until his primary orthopedist, Dr. Lajoyce Corners could evaluate the wound. Dr. Lajoyce Corners saw the patient 3/17 and is recommending proceeding with right transtibial amputation on 3/19 -patient has agreed.   Pending right BKA today.   ESRD-HD-MWF Nephrology involved.   Antibiotic associated diarrhea No clinical symptoms to suggest C. difficile  continue probiotic and as needed Imodium - improved per patient report   Chronic diastolic CHF HTN Volume status stable on dialysis PTA meds- Carvedilol, Imdur.  Currently both on hold because of low blood pressure.  Type 2 diabetes mellitus Hypoglycemia A1c 6.3 on 01/14/2024 PTA meds-Premeal sliding scale Currently on SSI/Accu-Cheks. Blood sugar level low today because of n.p.o. status.  Dextrose ampules given.  Recheck. Recent Labs  Lab 01/14/24 0713 01/14/24 1058 01/14/24 1708 01/14/24 2021 01/15/24 1246  GLUCAP 104* 139* 129* 98 70   CAD, HLD Continue aspirin,  Lipitor    BPH Continue finasteride and Flomax   Mobility: Postprocedure, PT eval needed  Goals of care   Code Status: Full Code     DVT prophylaxis:  heparin injection 5,000 Units Start: 01/07/24 0600 SCDs Start: 01/07/24 0018   Antimicrobials: IV Ancef, IV vancomycin, IV Zosyn currently Fluid: None Consultants: Orthopedics, nephrology Family Communication: None at bedside  Status: Inpatient Level of care:  Med-Surg   Patient is from: Home Needs to continue in-hospital care: pending surgery today Anticipated d/c to: Pending clinical course, might need rehab      Diet:  Diet Order             Diet NPO time specified  Diet effective ____                   Scheduled Meds:  [MAR Hold] (feeding supplement) PROSource Plus  30 mL Oral BID BM   [MAR Hold] aspirin EC  81 mg Oral Daily   [MAR Hold] atorvastatin  80 mg Oral Daily   chlorhexidine  60 mL Topical Once   [MAR Hold] Chlorhexidine Gluconate Cloth  6 each Topical Q0600   [MAR Hold] doxercalciferol  2 mcg Intravenous Q M,W,F-HD   [MAR Hold] feeding supplement  237 mL Oral BID BM   [MAR Hold] finasteride  5 mg Oral Daily   [MAR Hold] gabapentin  300 mg Oral BID   [MAR Hold] heparin  5,000 Units Subcutaneous Q8H   [MAR Hold] insulin aspart  0-5 Units Subcutaneous QHS   [MAR Hold] insulin aspart  0-9 Units Subcutaneous TID WC   [MAR Hold] mupirocin ointment   Nasal BID   [MAR Hold] saccharomyces boulardii  250 mg  Oral BID   [MAR Hold] sevelamer carbonate  1,600 mg Oral TID WC   [MAR Hold] tamsulosin  0.4 mg Oral QHS   tranexamic acid (CYKLOKAPRON) 2,000 mg in sodium chloride 0.9 % 50 mL Topical Application  2,000 mg Topical To OR    PRN meds: [MAR Hold] acetaminophen **OR** [DISCONTINUED] acetaminophen, [MAR Hold]  HYDROmorphone (DILAUDID) injection, [MAR Hold] linaclotide, [MAR Hold] loperamide, [MAR Hold] ondansetron **OR** [MAR Hold] ondansetron (ZOFRAN) IV, [MAR Hold] oxyCODONE   Infusions:    sodium chloride 10 mL/hr at 01/15/24 1347    ceFAZolin (ANCEF) IV     [MAR Hold] piperacillin-tazobactam (ZOSYN)  IV 2.25 g (01/15/24 0139)   tranexamic acid     [MAR Hold] vancomycin 750 mg (01/15/24 1050)    Antimicrobials: Anti-infectives (From admission, onward)    Start     Dose/Rate Route Frequency Ordered Stop   01/15/24 0600  ceFAZolin (ANCEF) IVPB 2g/100 mL premix        2 g 200 mL/hr over 30 Minutes Intravenous On call to O.R. 01/14/24 2130 01/16/24 0559   01/15/24 0600  vancomycin (VANCOCIN) IVPB 1000 mg/200 mL premix  Status:  Discontinued        1,000 mg 200 mL/hr over 60 Minutes Intravenous On call to O.R. 01/14/24 2130 01/15/24 0836   01/10/24 1200  [MAR Hold]  vancomycin (VANCOREADY) IVPB 750 mg/150 mL        (MAR Hold since Wed 01/15/2024 at 1323.Hold Reason: Transfer to a Procedural area)   750 mg 150 mL/hr over 60 Minutes Intravenous Every M-W-F (Hemodialysis) 01/08/24 1813     01/08/24 1900  vancomycin (VANCOREADY) IVPB 1750 mg/350 mL        1,750 mg 175 mL/hr over 120 Minutes Intravenous  Once 01/08/24 1813 01/09/24 0231   01/07/24 0100  [MAR Hold]  piperacillin-tazobactam (ZOSYN) IVPB 2.25 g        (MAR Hold since Wed 01/15/2024 at 1323.Hold Reason: Transfer to a Procedural area)   2.25 g 100 mL/hr over 30 Minutes Intravenous Every 8 hours 01/07/24 0031     01/06/24 2330  Ampicillin-Sulbactam (UNASYN) 3 g in sodium chloride 0.9 % 100 mL IVPB  Status:  Discontinued       Placed in "And" Linked Group   3 g 200 mL/hr over 30 Minutes Intravenous  Once 01/06/24 2326 01/07/24 0019   01/06/24 2330  vancomycin (VANCOCIN) IVPB 1000 mg/200 mL premix       Placed in "And" Linked Group   1,000 mg 200 mL/hr over 60 Minutes Intravenous  Once 01/06/24 2326 01/07/24 0053       Objective: Vitals:   01/15/24 1315 01/15/24 1332  BP:  (!) 117/58  Pulse:  78  Resp:    Temp:  98.2 F (36.8 C)  SpO2: 92% 96%    Intake/Output Summary (Last 24 hours) at 01/15/2024  1401 Last data filed at 01/15/2024 1158 Gross per 24 hour  Intake 1036.04 ml  Output 1.5 ml  Net 1034.54 ml   Filed Weights   01/15/24 0749 01/15/24 1223 01/15/24 1332  Weight: 69.7 kg 68.7 kg 69.7 kg   Weight change:  Body mass index is 19.21 kg/m.   Physical Exam: General exam: Pleasant, elderly African-American male Skin: No rashes, lesions or ulcers. HEENT: Atraumatic, normocephalic, no obvious bleeding Lungs: Clear to auscultation bilaterally,  CVS: S1, S2, no murmur,   GI/Abd: Soft, nontender, nondistended, bowel sound present,   CNS: Alert, awake, oriented x 3 Psychiatry: Sad affect Extremities:  No pedal edema, no calf tenderness, left prior BKA status.  Right foot wound bandaged  Data Review: I have personally reviewed the laboratory data and studies available.  F/u labs  Unresulted Labs (From admission, onward)    None       Total time spent in review of labs and imaging, patient evaluation, formulation of plan, documentation and communication with family: 55 minutes  Signed, Lorin Glass, MD [facility] 01/15/2024

## 2024-01-15 NOTE — Progress Notes (Signed)
   01/15/24 1223  Vitals  Temp 98.3 F (36.8 C)  Pulse Rate 82  Resp 14  BP (!) 116/93  SpO2 92 %  O2 Device Nasal Cannula  Weight 68.7 kg  Type of Weight Post-Dialysis  Oxygen Therapy  O2 Flow Rate (L/min) 2 L/min  Patient Activity (if Appropriate) In bed  Pulse Oximetry Type Continuous  Oximetry Probe Site Changed No  Post Treatment  Dialyzer Clearance Lightly streaked  Hemodialysis Intake (mL) 100 mL  Liters Processed 81.5  Fluid Removed (mL) 1000 mL  Tolerated HD Treatment Yes  AVG/AVF Arterial Site Held (minutes) 5 minutes  AVG/AVF Venous Site Held (minutes) 5 minutes   Received patient in bed to unit.  Alert and oriented.  Informed consent signed and in chart.   TX duration: Three hours and thirty minutes  Patient tolerated well.  Transported back to the room  Alert, without acute distress.  Hand-off given to patient's nurse.   Access used: Left upper arm fistula Access issues: none  Medication(s) given: vancomycin 750mg

## 2024-01-15 NOTE — Anesthesia Preprocedure Evaluation (Signed)
Anesthesia Evaluation  Patient identified by MRN, date of birth, ID band Patient awake    Reviewed: Allergy & Precautions, NPO status , Patient's Chart, lab work & pertinent test results  Airway Mallampati: II  TM Distance: >3 FB Neck ROM: Full    Dental  (+) Teeth Intact, Dental Advisory Given   Pulmonary shortness of breath, former smoker   Pulmonary exam normal breath sounds clear to auscultation       Cardiovascular hypertension, Pt. on medications + CAD, + Past MI, + Peripheral Vascular Disease and +CHF  Normal cardiovascular exam Rhythm:Regular Rate:Normal     Neuro/Psych  Neuromuscular disease    GI/Hepatic Neg liver ROS,GERD  ,,  Endo/Other  negative endocrine ROSdiabetes    Renal/GU ESRF and DialysisRenal disease     Musculoskeletal  (+) Arthritis ,  Fibromyalgia -Osteomyelitis Left Foot   Abdominal   Peds  Hematology  (+) Blood dyscrasia, anemia   Anesthesia Other Findings Day of surgery medications reviewed with the patient.  Reproductive/Obstetrics                              Anesthesia Physical Anesthesia Plan  ASA: 3  Anesthesia Plan: Regional   Post-op Pain Management: Tylenol PO (pre-op)*   Induction: Intravenous  PONV Risk Score and Plan: 1 and TIVA, Treatment may vary due to age or medical condition, Dexamethasone and Ondansetron  Airway Management Planned: Natural Airway and Simple Face Mask  Additional Equipment:   Intra-op Plan:   Post-operative Plan:   Informed Consent: I have reviewed the patients History and Physical, chart, labs and discussed the procedure including the risks, benefits and alternatives for the proposed anesthesia with the patient or authorized representative who has indicated his/her understanding and acceptance.     Dental advisory given  Plan Discussed with: CRNA  Anesthesia Plan Comments:          Anesthesia Quick  Evaluation

## 2024-01-16 ENCOUNTER — Encounter (HOSPITAL_COMMUNITY): Payer: Self-pay | Admitting: Orthopedic Surgery

## 2024-01-16 DIAGNOSIS — L089 Local infection of the skin and subcutaneous tissue, unspecified: Secondary | ICD-10-CM | POA: Diagnosis not present

## 2024-01-16 DIAGNOSIS — E11628 Type 2 diabetes mellitus with other skin complications: Secondary | ICD-10-CM | POA: Diagnosis not present

## 2024-01-16 LAB — GLUCOSE, CAPILLARY
Glucose-Capillary: 155 mg/dL — ABNORMAL HIGH (ref 70–99)
Glucose-Capillary: 137 mg/dL — ABNORMAL HIGH (ref 70–99)
Glucose-Capillary: 115 mg/dL — ABNORMAL HIGH (ref 70–99)

## 2024-01-16 MED ORDER — APIXABAN 5 MG PO TABS
5.0000 mg | ORAL_TABLET | Freq: Two times a day (BID) | ORAL | Status: DC
  Administered 2024-01-16 – 2024-01-21 (×9): 5 mg via ORAL
  Filled 2024-01-16 (×9): qty 1

## 2024-01-16 MED ORDER — CHLORHEXIDINE GLUCONATE CLOTH 2 % EX PADS
6.0000 | MEDICATED_PAD | Freq: Every day | CUTANEOUS | Status: DC
Start: 2024-01-17 — End: 2024-01-22
  Administered 2024-01-18 – 2024-01-19 (×2): 6 via TOPICAL

## 2024-01-16 NOTE — Progress Notes (Signed)
 Occupational Therapy Re-Evaluation  Patient Details Name: William Webb MRN: [id number] DOB: 01-15-1951 Today's Date: 01/16/2024   History of present illness Pt is a 73 y/o male presenting on 3/10 with chronic R foot wound, lethargy. 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  PMH includes: ESRD HD MWF, DM2, HTN, L BKA.   OT comments  Pt seen s/p R BKA.  Patient with improved alertness compared to last OT session, remains limited by impaired balance, generalized weakness and decreased activity tolerance.  Requires mod assist +2 for bed mobility and transfers (lateral scoot) into recliner. He is able to follow simple commands but demonstrates difficulty sequencing at times.  Based on performance today, believe patient will best benefit from continued OT services acutely and after dc at inpatient setting with <3hrs/day to optimize independence, safety with ADLs and mobility.       If plan is discharge home, recommend the following:  A lot of help with bathing/dressing/bathroom;Assistance with cooking/housework;Assist for transportation;Help with stairs or ramp for entrance;Two people to help with walking and/or transfers   Equipment Recommendations  Other (comment) (defer)    Recommendations for Other Services      Precautions / Restrictions Precautions Precautions: Fall Recall of Precautions/Restrictions: Intact Precaution/Restrictions Comments: chronic L BKA, R BKA with limb protector Restrictions Weight Bearing Restrictions Per Provider Order: Yes RLE Weight Bearing Per Provider Order: Non weight bearing       Mobility Bed Mobility Overal bed mobility: Needs Assistance Bed Mobility: Supine to Sit     Supine to sit: Used rails, Mod assist, +2 for physical assistance     General bed mobility comments: assist for scooting hips, elevating trunk    Transfers Overall transfer level: Needs assistance   Transfers: Bed to chair/wheelchair/BSC             Lateral/Scoot Transfers: Mod assist, +2 physical assistance General transfer comment: +2 mod assist towards L side into recliner     Balance Overall balance assessment: Needs assistance Sitting-balance support: No upper extremity supported, Bilateral upper extremity supported, Single extremity supported, Feet unsupported Sitting balance-Leahy Scale: Poor Sitting balance - Comments: posterior lean, BUE support preference but requires min assist; at best min guard with cueing to stay forward Postural control: Posterior lean                                 ADL either performed or assessed with clinical judgement   ADL Overall ADL's : Needs assistance/impaired     Grooming: Set up;Wash/dry face;Sitting           Upper Body Dressing : Minimal assistance;Sitting   Lower Body Dressing: Total assistance;Sitting/lateral leans   Toilet Transfer: Moderate assistance;+2 for physical assistance Toilet Transfer Details (indicate cue type and reason): lateral scoot to recliner         Functional mobility during ADLs: Moderate assistance;+2 for physical assistance      Extremity/Trunk Assessment Upper Extremity Assessment Upper Extremity Assessment: Generalized weakness   Lower Extremity Assessment Lower Extremity Assessment: Defer to PT evaluation (s/p R BKA)        Vision   Vision Assessment?: No apparent visual deficits   Perception     Praxis     Communication Communication Communication: No apparent difficulties Factors Affecting Communication:  (soft spoken)   Cognition Arousal: Alert Behavior During Therapy: Flat affect Cognition: No apparent impairments  OT - Cognition Comments: not formally assessed today, but pt aware of deficits and need for assist. some difficluty sequencing                 Following commands: Intact Following commands impaired: Follows one step commands inconsistently      Cueing   Cueing  Techniques: Verbal cues, Tactile cues, Visual cues  Exercises      Shoulder Instructions       General Comments Limb protector donned    Pertinent Vitals/ Pain       Pain Assessment Pain Assessment: Faces Faces Pain Scale: No hurt Pain Intervention(s): Limited activity within patient's tolerance, Monitored during session, Repositioned  Home Living                                          Prior Functioning/Environment              Frequency  Min 2X/week        Progress Toward Goals  OT Goals(current goals can now be found in the care plan section)  Progress towards OT goals: Progressing toward goals  Acute Rehab OT Goals Patient Stated Goal: get better OT Goal Formulation: With patient Time For Goal Achievement: 01/23/24 Potential to Achieve Goals: Good  Plan      Co-evaluation    PT/OT/SLP Co-Evaluation/Treatment: Yes Reason for Co-Treatment: For patient/therapist safety;To address functional/ADL transfers   OT goals addressed during session: ADL's and self-care      AM-PAC OT "6 Clicks" Daily Activity     Outcome Measure   Help from another person eating meals?: A Little Help from another person taking care of personal grooming?: A Little Help from another person toileting, which includes using toliet, bedpan, or urinal?: Total Help from another person bathing (including washing, rinsing, drying)?: A Lot Help from another person to put on and taking off regular upper body clothing?: A Little Help from another person to put on and taking off regular lower body clothing?: Total 6 Click Score: 13    End of Session Equipment Utilized During Treatment: Gait belt;Oxygen  OT Visit Diagnosis: Other abnormalities of gait and mobility (R26.89);Muscle weakness (generalized) (M62.81);Pain;Other symptoms and signs involving cognitive function   Activity Tolerance Patient tolerated treatment well   Patient Left in chair;with call bell/phone  within reach;with chair alarm set   Nurse Communication Mobility status        Time: 1610-9604 OT Time Calculation (min): 24 min  Charges: OT General Charges $OT Visit: 1 Visit OT Evaluation $OT Re-eval: 1 Re-eval OT Treatments $Self Care/Home Management : 8-22 mins  Barry Brunner, OT Acute Rehabilitation Services Office [phone] Secure Chat Preferred    William Webb 01/16/2024, 12:42 PM

## 2024-01-16 NOTE — Anesthesia Postprocedure Evaluation (Signed)
 Anesthesia Post Note  Patient: William Webb [facility]  Procedure(s) Performed: AMPUTATION BELOW KNEE (Right: Knee)     Patient location during evaluation: PACU Anesthesia Type: Regional and MAC Level of consciousness: awake and alert Pain management: pain level controlled Vital Signs Assessment: post-procedure vital signs reviewed and stable Respiratory status: spontaneous breathing, nonlabored ventilation, respiratory function stable and patient connected to nasal cannula oxygen Cardiovascular status: stable and blood pressure returned to baseline Postop Assessment: no apparent nausea or vomiting Anesthetic complications: no   No notable events documented.  Last Vitals:  Vitals:   01/16/24 0901 01/16/24 1553  BP: (!) 116/47 (!) 84/50  Pulse: 72 73  Resp: 18 16  Temp: 36.7 C 36.7 C  SpO2:  99%    Last Pain:  Vitals:   01/16/24 1517  TempSrc:   PainSc: Asleep   Pain Goal:                   [name]

## 2024-01-16 NOTE — TOC Progression Note (Signed)
 Transition of Care [facility]) - Progression Note    Patient Details  Name: William Webb MRN: [id number] Date of Birth: [birth year]  Transition of [facility] CM/SW Contact  Dellie Burns [place], [place] Phone Number: 01/16/2024, 3:02 PM  Clinical Narrative:  Per CM note 3/17, pt and pt's sister Eber Jones agreeable to SNF for STR. SNF search started, will f/u with offers as available.   Dellie Burns, MSW, LCSW [phone] (coverage)       Expected Discharge Plan: Home w Home Health Services Barriers to Discharge: Continued Medical Work up  Expected Discharge Plan and Services In-house Referral: Clinical Social Work Discharge Planning Services: CM Consult Post Acute Care Choice: Home Health Living arrangements for the past 2 months: Apartment                                       Social Determinants of Health (SDOH) Interventions SDOH Screenings   Food Insecurity: No Food Insecurity (01/07/2024)  Housing: Low Risk  (01/07/2024)  Transportation Needs: No Transportation Needs (01/07/2024)  Utilities: Not At Risk (01/07/2024)  Depression (PHQ2-9): Low Risk  (10/07/2020)  Social Connections: Socially Isolated (01/07/2024)  Tobacco Use: Medium Risk (01/15/2024)    Readmission Risk Interventions    05/22/2023   11:05 AM 03/22/2023    4:41 PM 03/19/2022    3:43 PM  Readmission Risk Prevention Plan  Transportation Screening Complete Complete Complete  Medication Review (RN Care Manager) Referral to Pharmacy Complete Complete  PCP or Specialist appointment within 3-5 days of discharge Complete Complete Complete  HRI or Home Care Consult Complete Complete Complete  SW Recovery Care/Counseling Consult Complete Complete Complete  Palliative Care Screening Not Applicable Not Applicable Not Applicable  Skilled Nursing Facility Not Applicable Not Applicable Not Applicable

## 2024-01-16 NOTE — Progress Notes (Signed)
 PROGRESS NOTE  Ishmeal Rorie [facility]  DOB: [birth year]  PCP: Irven Coe, MD ZOX:[id number]  DOA: 01/06/2024  LOS: 7 days  Hospital Day: 11  Brief narrative: William Webb is a 73 y.o. male with PMH significant for ESRD-HD-MWF, DM2, HTN, HLD, CAD, CHF, paroxysmal A-fib, PAD, diabetic neuropathy s/p prior left BKA.  He also has a right foot wound that continue to worsen despite endovascular intervention in January. 3/10, patient presented to the ED with complaint of 2 to 3 days of fever, generalized weakness and increased drainage from chronic right foot wound X-ray of right foot was concerning for osteomyelitis. MRI right foot also suggested osteomyelitis  Subjective: Patient was seen and examined this morning in dialysis. Elderly African-American male. Not in distress.   Underwent right BKA yesterday.  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 Lajoyce Corners Orthopedics evaluated and was concerned patient may require right BKA - patient/family understandably wanted to wait until his primary orthopedist, Dr. Lajoyce Corners could evaluate the wound. Dr. Lajoyce Corners saw the patient 3/17 and is recommended right transtibial amputation which he underwent on 3/19.   ESRD-HD-MWF Nephrology involved.   Antibiotic associated diarrhea No clinical symptoms to suggest C. difficile  continue probiotic and as needed Imodium - improved per patient report   Paroxysmal A-fib Continue Coreg. Chronically anticoagulated with Eliquis.  Resumed after surgery.  Chronic diastolic CHF HTN Volume status stable on dialysis PTA meds- Carvedilol, Imdur.  Currently both on hold because of low blood pressure.   CAD, HLD Continue aspirin, Lipitor  Type 2 diabetes mellitus Hypoglycemia A1c 6.3 on 01/14/2024 PTA meds-Premeal sliding scale Currently on SSI/Accu-Cheks. Blood sugar level low today because of n.p.o. status.  Dextrose ampules given.   Recheck. Recent Labs  Lab 01/15/24 1446 01/15/24 1608 01/15/24 1654 01/15/24 2045 01/16/24 0755  GLUCAP 135* 131* 127* 165* 155*    BPH Continue finasteride and Flomax   Mobility: PT/OT eval pending  Goals of care   Code Status: Full Code     DVT prophylaxis:  SCD's Start: 01/15/24 1648 heparin injection 5,000 Units Start: 01/07/24 0600 SCDs Start: 01/07/24 0018   Antimicrobials: Completed the course of IV antibiotics.   Fluid: None Consultants: Orthopedics, nephrology Family Communication: None at bedside  Status: Inpatient Level of care:  Med-Surg   Patient is from: Home Needs to continue in-hospital care: POD1 Anticipated d/c to: Pending PT eval, needs placement      Diet:  Diet Order             Diet renal with fluid restriction Fluid restriction: 1200 mL Fluid; Room service appropriate? Yes; Fluid consistency: Thin  Diet effective now                   Scheduled Meds:  (feeding supplement) PROSource Plus  30 mL Oral BID BM   vitamin C  1,000 mg Oral Daily   aspirin EC  81 mg Oral Daily   atorvastatin  80 mg Oral Daily   [START ON 01/17/2024] Chlorhexidine Gluconate Cloth  6 each Topical Q0600   doxercalciferol  2 mcg Intravenous Q M,W,F-HD   feeding supplement  237 mL Oral BID BM   finasteride  5 mg Oral Daily   gabapentin  300 mg Oral BID   heparin  5,000 Units Subcutaneous Q8H   insulin aspart  0-5 Units Subcutaneous QHS   insulin aspart  0-9 Units Subcutaneous TID WC   mupirocin ointment   Nasal  BID   nutrition supplement (JUVEN)  1 packet Oral BID BM   saccharomyces boulardii  250 mg Oral BID   sevelamer carbonate  1,600 mg Oral TID WC   tamsulosin  0.4 mg Oral QHS   zinc sulfate (50mg  elemental zinc)  220 mg Oral Daily    PRN meds: acetaminophen **OR** [DISCONTINUED] acetaminophen, HYDROmorphone (DILAUDID) injection, linaclotide, loperamide, ondansetron **OR** ondansetron (ZOFRAN) IV, oxyCODONE   Infusions:   sodium chloride      piperacillin-tazobactam (ZOSYN)  IV 2.25 g (01/16/24 0855)    Antimicrobials: Anti-infectives (From admission, onward)    Start     Dose/Rate Route Frequency Ordered Stop   01/15/24 0600  ceFAZolin (ANCEF) IVPB 2g/100 mL premix        2 g 200 mL/hr over 30 Minutes Intravenous On call to O.R. 01/14/24 2130 01/15/24 1510   01/15/24 0600  vancomycin (VANCOCIN) IVPB 1000 mg/200 mL premix  Status:  Discontinued        1,000 mg 200 mL/hr over 60 Minutes Intravenous On call to O.R. 01/14/24 2130 01/15/24 0836   01/10/24 1200  vancomycin (VANCOREADY) IVPB 750 mg/150 mL        750 mg 150 mL/hr over 60 Minutes Intravenous Every M-W-F (Hemodialysis) 01/08/24 1813 01/15/24 1150   01/08/24 1900  vancomycin (VANCOREADY) IVPB 1750 mg/350 mL        1,750 mg 175 mL/hr over 120 Minutes Intravenous  Once 01/08/24 1813 01/09/24 0231   01/07/24 0100  piperacillin-tazobactam (ZOSYN) IVPB 2.25 g        2.25 g 100 mL/hr over 30 Minutes Intravenous Every 8 hours 01/07/24 0031 01/16/24 2359   01/06/24 2330  Ampicillin-Sulbactam (UNASYN) 3 g in sodium chloride 0.9 % 100 mL IVPB  Status:  Discontinued       Placed in "And" Linked Group   3 g 200 mL/hr over 30 Minutes Intravenous  Once 01/06/24 2326 01/07/24 0019   01/06/24 2330  vancomycin (VANCOCIN) IVPB 1000 mg/200 mL premix       Placed in "And" Linked Group   1,000 mg 200 mL/hr over 60 Minutes Intravenous  Once 01/06/24 2326 01/07/24 0053       Objective: Vitals:   01/16/24 0629 01/16/24 0901  BP: (!) 127/50 (!) 116/47  Pulse: 68 72  Resp: 18 18  Temp: 98.3 F (36.8 C)   SpO2:      Intake/Output Summary (Last 24 hours) at 01/16/2024 1340 Last data filed at 01/16/2024 0656 Gross per 24 hour  Intake 200 ml  Output 25 ml  Net 175 ml   Filed Weights   01/15/24 0749 01/15/24 1223 01/15/24 1332  Weight: 69.7 kg 68.7 kg 69.7 kg   Weight change:  Body mass index is 19.21 kg/m.   Physical Exam: General exam: Pleasant, elderly  African-American male Skin: No rashes, lesions or ulcers. HEENT: Atraumatic, normocephalic, no obvious bleeding Lungs: Clear to auscultation bilaterally,  CVS: S1, S2, no murmur,   GI/Abd: Soft, nontender, nondistended, bowel sound present,   CNS: Alert, awake, oriented x 3 Psychiatry: Sad affect Extremities: No pedal edema, no calf tenderness, left prior BKA status.  New right AKA status.  Data Review: I have personally reviewed the laboratory data and studies available.  F/u labs  Unresulted Labs (From admission, onward)     Start     Ordered   01/17/24 0500  Renal function panel  Once,   R        01/16/24 1229   01/17/24 0500  CBC with Differential/Platelet  Once,   R        01/16/24 1229            Total time spent in review of labs and imaging, patient evaluation, formulation of plan, documentation and communication with family: 45 minutes  Signed, Lorin Glass, MD [facility] 01/16/2024

## 2024-01-16 NOTE — Progress Notes (Signed)
 Patient ID: William Webb [facility], male   DOB: Aug 07, 1951, 73 y.o.   MRN: [id number] Patient is postoperative day 1 right transtibial amputation.  There is no drainage in the wound VAC canister.  There is insufficient suction fit to transition to the Prevena plus pump.  Will plan to discontinue wound VAC at discharge.  Discharge planning based on physical therapy recommendations.

## 2024-01-16 NOTE — Progress Notes (Addendum)
 [facility] Progress Note   Subjective:    Seen and examined patient at bedside. S/p right transtibial amputation by Dr. Lajoyce Corners yesterday. He informs me of planning for rehab. Denies SOB and CP. Next HD 3/21.  Objective Vitals:   01/15/24 1652 01/15/24 2050 01/16/24 0629 01/16/24 0901  BP: (!) 153/71 (!) 115/48 (!) 127/50 (!) 116/47  Pulse: 72 (!) 44 68 72  Resp: 18 18 18 18   Temp:  98.6 F (37 C) 98.3 F (36.8 C)   TempSrc:      SpO2: 94% (!) 78%    Weight:      Height:       Physical Exam General: Alert male in NAD Heart: RRR, no murmurs, rubs or gallops Lungs: CTA bilaterally, respirations unlabored on RA Abdomen: Soft, non-distended, +BS Extremities: L BKA, no edema appreciated Dialysis Access:  LUE AVF + bruit  Filed Weights   01/15/24 0749 01/15/24 1223 01/15/24 1332  Weight: 69.7 kg 68.7 kg 69.7 kg    Intake/Output Summary (Last 24 hours) at 01/16/2024 1216 Last data filed at 01/16/2024 0656 Gross per 24 hour  Intake 200 ml  Output 1025 ml  Net -825 ml    Additional Objective Labs: Basic Metabolic Panel: Recent Labs  Lab 01/10/24 0852 01/12/24 1834 01/15/24 0802 01/15/24 1344  NA 136 133* 133* 133*  K 3.1* 3.4* 3.0* 3.2*  CL 98 91* 92* 93*  CO2 24 24 25   --   GLUCOSE 141* 118* 108* 186*  BUN 32* 34* 35* 17  CREATININE 6.52* 7.05* 6.99* 4.10*  CALCIUM 8.5* 8.4* 8.9  --   PHOS 4.7* 4.4 5.7*  --    Liver Function Tests: Recent Labs  Lab 01/10/24 0852 01/12/24 1834 01/15/24 0802  ALBUMIN 2.3* 2.2* 2.1*   No results for input(s): "LIPASE", "AMYLASE" in the last 168 hours. CBC: Recent Labs  Lab 01/10/24 0852 01/15/24 0500 01/15/24 1344  WBC 10.1 11.4*  --   NEUTROABS  --  9.5*  --   HGB 10.2* 11.8* 14.6  HCT 33.0* 37.3* 43.0  MCV 90.9 88.8  --   PLT 158 283  --    Blood Culture    Component Value Date/Time   SDES BLOOD RIGHT HAND 01/06/2024 2138   SPECREQUEST  01/06/2024 2138    BOTTLES DRAWN AEROBIC AND ANAEROBIC  Blood Culture results may not be optimal due to an inadequate volume of blood received in culture bottles   CULT  01/06/2024 2138    NO GROWTH 5 DAYS Performed at [facility], [address] [place], [place] [address]    REPTSTATUS 01/11/2024 FINAL 01/06/2024 2138    Cardiac Enzymes: No results for input(s): "CKTOTAL", "CKMB", "CKMBINDEX", "TROPONINI" in the last 168 hours. CBG: Recent Labs  Lab 01/15/24 1446 01/15/24 1608 01/15/24 1654 01/15/24 2045 01/16/24 0755  GLUCAP 135* 131* 127* 165* 155*   Iron Studies: No results for input(s): "IRON", "TIBC", "TRANSFERRIN", "FERRITIN" in the last 72 hours. Lab Results  Component Value Date   INR 1.3 (H) 10/19/2023   INR 1.4 (H) 05/20/2023   INR 1.4 (H) 03/21/2023   Studies/Results: No results found.  Medications:  sodium chloride     piperacillin-tazobactam (ZOSYN)  IV 2.25 g (01/16/24 0855)    (feeding supplement) PROSource Plus  30 mL Oral BID BM   vitamin C  1,000 mg Oral Daily   aspirin EC  81 mg Oral Daily   atorvastatin  80 mg Oral Daily   Chlorhexidine  Gluconate Cloth  6 each Topical Q0600   doxercalciferol  2 mcg Intravenous Q M,W,F-HD   feeding supplement  237 mL Oral BID BM   finasteride  5 mg Oral Daily   gabapentin  300 mg Oral BID   heparin  5,000 Units Subcutaneous Q8H   insulin aspart  0-5 Units Subcutaneous QHS   insulin aspart  0-9 Units Subcutaneous TID WC   mupirocin ointment   Nasal BID   nutrition supplement (JUVEN)  1 packet Oral BID BM   saccharomyces boulardii  250 mg Oral BID   sevelamer carbonate  1,600 mg Oral TID WC   tamsulosin  0.4 mg Oral QHS   zinc sulfate (50mg  elemental zinc)  220 mg Oral Daily    Dialysis Orders: AF MWF 4:00 425/800 EDW 80.4kg 2K/2Ca AVF No heparin  Post HD wt 3/10 - 75.4 kg  -Mircera 225 q 2 weeks (last 3/3) -Hectorol 2 q HD   Assessment/Plan: Sepsis/R foot wound - possible oseto on MRI. On IV Vanc/Zosyn. Blood cultures ngtd. Orthopedics consulted - S/p  for right transtibial amputation yesterday. ESRD -  HD MWF. Continue on schedule. K+ running low, use 4K bath. Next HD 3/21. Hypertension/volume  - BP ok. s/p IVFs. Appears euvolemic on exam. Now under EDW here. Plan to lower at discharge. Anemia  - Hgb 10-11. Last ESA given 3/3 in outpatient. Will order ESA to be given with HD tomorrow. Follow trends.  Metabolic bone disease -  Calcium acceptable. Phos at goal. Continue home meds  Nutrition - Renal diet, fluid restriction. Prot supp for low albumin  Dispo - Per Ortho, likely will need inpatient rehab vs outpatient rehab.  Salome Holmes, NP [facility] 01/16/2024,12:16 PM  LOS: 7 days

## 2024-01-16 NOTE — [place] FL2 (Signed)
 [place] MEDICAID FL2 LEVEL OF CARE FORM     IDENTIFICATION  Patient Name: [facility] Birthdate: 12/25/1950 Sex: male Admission Date (Current Location): 01/06/2024  [facility] and [place] Number:  Producer, television/film/video and Address:  The [place], [address], [place], [place] [address]      Provider Number: [id number]  Attending Physician Name and Address:  Lorin Glass, MD  Relative Name and Phone Number:       Current Level of Care: Hospital Recommended Level of Care: Skilled Nursing Facility Prior Approval Number:    Date Approved/Denied:   PASRR Number: [id number]  Discharge Plan: SNF    Current Diagnoses: Patient Active Problem List   Diagnosis Date Noted   Gangrene of right foot (HCC) 01/13/2024   Diabetic foot ulcer (HCC) 01/09/2024   Critical limb ischemia of both lower extremities (HCC) 10/31/2023   Malnutrition of moderate degree 10/22/2023   COVID-19 virus infection 10/20/2023   Diabetic foot infection (HCC) 10/20/2023   Non-pressure chronic ulcer of right heel and midfoot limited to breakdown of skin (HCC) 10/20/2023   Nonrheumatic aortic valve stenosis 07/11/2023   Acute osteomyelitis of left calcaneus (HCC) 06/04/2023   Pressure injury of skin 05/28/2023   FUO (fever of unknown origin) 05/21/2023   Leukocytosis 05/21/2023   History of CAD (coronary artery disease) 05/21/2023   Elevated temperature due to infection 05/21/2023   Pulmonary nodules 05/21/2023   Hemorrhoids 03/22/2023   Internal hemorrhoid 03/22/2023   Bright red blood per rectum 03/21/2023   Prolonged QT interval 03/21/2023   Precordial pain    Critical limb ischemia of right lower extremity (HCC)    Acute osteomyelitis of toe, right (HCC) 03/13/2022   Osteomyelitis (HCC) 03/13/2022   Elevated troponin    SIRS (systemic inflammatory response syndrome) (HCC) 03/12/2022   ESRD (end stage renal disease) (HCC) 10/19/2021   Community acquired  pneumonia 02/01/2021   PNA (pneumonia) 02/01/2021   Hypoxia    Pleural effusion on right 12/02/2020   Pericardial effusion 11/02/2020   Loss of consciousness (HCC) 09/16/2020   Syncope and collapse 09/15/2020   CKD (chronic kidney disease), stage IV (HCC) 08/11/2020   Acute kidney injury superimposed on CKD (HCC) 07/31/2020   Hyperlipidemia associated with type 2 diabetes mellitus (HCC) 07/31/2020   Paroxysmal atrial fibrillation (HCC) 07/31/2020   Chronic disease anemia 07/31/2020   Nausea and vomiting 07/31/2020   PAD (peripheral artery disease) (HCC) 03/19/2020   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, limited to breakdown of skin (HCC) 01/08/2020   Status post vascular surgery 12/07/2019   Respiratory failure (HCC) 12/07/2019   Persistent atrial fibrillation (HCC) 07/10/2019   H/O: GI bleed 07/10/2019   CKD (chronic kidney disease) stage 5, GFR less than 15 ml/min (HCC) 06/08/2019   Erectile dysfunction 05/06/2019   Coronary artery disease involving native coronary artery of native heart without angina pectoris 04/05/2019   Acute on chronic respiratory failure with hypoxia (HCC) 12/10/2018   Healthcare-associated pneumonia 12/10/2018   Hemoptysis 12/10/2018   Sepsis (HCC) 12/10/2018   Acute respiratory failure with hypoxia (HCC) 12/10/2018   Severe protein-calorie malnutrition (HCC) 12/02/2018   CAD (coronary artery disease) 12/01/2018   Syncope 12/01/2018   DOE (dyspnea on exertion) 11/18/2018   Hypertension associated with diabetes (HCC)    Hyperlipidemia    Diabetes mellitus with renal complications (HCC)    Noncompliance with medication regimen    Chronic pain syndrome 05/29/2018   Insulin dependent type 2  diabetes mellitus (HCC) 04/07/2018   Stage 3a chronic kidney disease (HCC) 03/11/2018   Fall 11/20/2017   AKI (acute kidney injury) (HCC) 11/20/2017   Normocytic normochromic anemia 11/20/2017   Hypoglycemia 11/19/2017   Essential hypertension 11/19/2017     Orientation RESPIRATION BLADDER Height & Weight     Self, Time, Situation, Place  O2 Incontinent Weight: 153 lb 10.6 oz (69.7 kg) Height:  6\' 3"  (190.5 cm)  BEHAVIORAL SYMPTOMS/MOOD NEUROLOGICAL BOWEL NUTRITION STATUS      Incontinent    AMBULATORY STATUS COMMUNICATION OF NEEDS Skin   Extensive Assist Verbally Surgical wounds                       Personal Care Assistance Level of Assistance  Bathing, Feeding, Dressing Bathing Assistance: Maximum assistance Feeding assistance: Limited assistance Dressing Assistance: Maximum assistance     Functional Limitations Info  Sight, Speech, Hearing Sight Info: Adequate Hearing Info: Adequate Speech Info: Adequate    SPECIAL CARE FACTORS FREQUENCY  PT (By licensed PT), OT (By licensed OT)                    Contractures      Additional Factors Info                  Current Medications (01/16/2024):  This is the current hospital active medication list Current Facility-Administered Medications  Medication Dose Route Frequency Provider Last Rate Last Admin   (feeding supplement) PROSource Plus liquid 30 mL  30 mL Oral BID BM [name], MD   30 mL at 01/16/24 1432   0.9 %  sodium chloride infusion   Intravenous Continuous [name], MD       acetaminophen (TYLENOL) tablet 650 mg  650 mg Oral Q6H PRN [name], MD   650 mg at 01/10/24 2345   apixaban (ELIQUIS) tablet 5 mg  5 mg Oral BID [name], MD       ascorbic acid (VITAMIN C) tablet 1,000 mg  1,000 mg Oral Daily [name], MD   1,000 mg at 01/16/24 2440   aspirin EC tablet 81 mg  81 mg Oral Daily [name], MD   81 mg at 01/16/24 0843   atorvastatin (LIPITOR) tablet 80 mg  80 mg Oral Daily [name], MD   80 mg at 01/16/24 0843   [START ON 01/17/2024] Chlorhexidine Gluconate Cloth 2 % PADS 6 each  6 each Topical Q0600 Berenda Morale, NP       doxercalciferol (HECTOROL) injection 2 mcg  2 mcg Intravenous Q M,W,F-HD [name], MD   2 mcg at 01/15/24 1047   feeding supplement (ENSURE ENLIVE / ENSURE PLUS) liquid 237 mL  237 mL Oral BID BM [name], MD   237 mL at 01/16/24 1432   finasteride (PROSCAR) tablet 5 mg  5 mg Oral Daily [name], MD   5 mg at 01/16/24 0843   gabapentin (NEURONTIN) capsule 300 mg  300 mg Oral BID [name], MD   300 mg at 01/16/24 1027   HYDROmorphone (DILAUDID) injection 0.5-1 mg  0.5-1 mg Intravenous Q4H PRN [name], MD       insulin aspart (novoLOG) injection 0-5 Units  0-5 Units Subcutaneous QHS [name], MD       insulin aspart (novoLOG) injection 0-9 Units  0-9 Units Subcutaneous TID WC [name], MD  2 Units at 01/16/24 1610   linaclotide (LINZESS) capsule 145 mcg  145 mcg Oral PRN [name], MD       loperamide (IMODIUM) capsule 2 mg  2 mg Oral PRN [name], MD   2 mg at 01/16/24 0859   mupirocin ointment (BACTROBAN) 2 %   Nasal BID HammonsGerhard Munch, RPH   Given at 01/16/24 1100   nutrition supplement (JUVEN) (JUVEN) powder packet 1 packet  1 packet Oral BID BM [name], MD   1 packet at 01/16/24 1432   ondansetron (ZOFRAN) tablet 4 mg  4 mg Oral Q6H PRN [name], MD       Or   ondansetron [facility]) injection 4 mg  4 mg Intravenous Q6H PRN [name], MD       oxyCODONE (Oxy IR/ROXICODONE) immediate release tablet 5 mg  5 mg Oral Q4H PRN [name], MD   5 mg at 01/16/24 1432   piperacillin-tazobactam (ZOSYN) IVPB 2.25 g  2.25 g Intravenous Q8H Hammons, Kimberly B, RPH 100 mL/hr at 01/16/24 0855 2.25 g at 01/16/24 9604   saccharomyces boulardii (FLORASTOR) capsule 250 mg  250 mg Oral BID [name], MD   250 mg at 01/16/24 0843   sevelamer carbonate (RENVELA) tablet 1,600 mg  1,600 mg Oral TID WC [name], MD   1,600 mg at 01/16/24 0843   tamsulosin (FLOMAX) capsule 0.4 mg  0.4 mg Oral QHS [name], MD   0.4 mg at 01/15/24 2316   zinc sulfate (50mg  elemental zinc) capsule 220 mg  220 mg Oral  Daily [name], MD   220 mg at 01/16/24 5409     Discharge Medications: Please see discharge summary for a list of discharge medications.  Relevant Imaging Results:  Relevant Lab Results:   Additional Information SSN: [id number]; HD: MWF @ [facility], [place] time 10:30- Chair time 10:50  Dellie Burns [place], [place]

## 2024-01-16 NOTE — Evaluation (Signed)
 Physical Therapy Re-Evaluation Patient Details Name: [facility] MRN: [id number] DOB: [birth year] Today's Date: 01/16/2024  History of Present Illness  Pt is a 73 y/o male presenting on 3/10 with chronic R foot wound, lethargy. 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  PMH includes: ESRD HD MWF, DM2, HTN, L BKA.  Clinical Impression  Patient presents with decreased mobility due to decreased balance, decreased activity tolerance, decreased strength and he will continue to benefit from skilled PT in the acute setting.  Today with +2 A able to perform lateral scoot transfer to drop arm recliner.  Continue to feel he will need post-acute inpatient rehab at d/c.          If plan is discharge home, recommend the following: A lot of help with walking and/or transfers;A lot of help with bathing/dressing/bathroom;Assist for transportation;Help with stairs or ramp for entrance   Can travel by private vehicle   No    Equipment Recommendations None recommended by PT  Recommendations for Other Services       Functional Status Assessment Patient has had a recent decline in their functional status and demonstrates the ability to make significant improvements in function in a reasonable and predictable amount of time.     Precautions / Restrictions Precautions Precautions: Fall Recall of Precautions/Restrictions: Intact Precaution/Restrictions Comments: chronic L BKA, R BKA with limb protector, wound vac Restrictions Weight Bearing Restrictions Per Provider Order: Yes RLE Weight Bearing Per Provider Order: Non weight bearing      Mobility  Bed Mobility Overal bed mobility: Needs Assistance Bed Mobility: Supine to Sit     Supine to sit: Used rails, Mod assist, +2 for physical assistance     General bed mobility comments: assist for scooting hips, elevating trunk    Transfers Overall transfer level: Needs assistance   Transfers: Bed to chair/wheelchair/BSC             Lateral/Scoot Transfers: Mod assist, +2 physical assistance General transfer comment: +2 mod assist towards L side into recliner; cues for hand placement, step by step with about 4-5 scoots to complete transfer; cues throughout for anterior weight shift    Ambulation/Gait                  Stairs            Wheelchair Mobility     Tilt Bed    Modified Rankin (Stroke Patients Only)       Balance Overall balance assessment: Needs assistance Sitting-balance support: No upper extremity supported, Bilateral upper extremity supported, Single extremity supported, Feet unsupported Sitting balance-Leahy Scale: Poor Sitting balance - Comments: min A for balance much of the time at EOB                                     Pertinent Vitals/Pain Pain Assessment Pain Assessment: Faces Faces Pain Scale: No hurt    Home Living                          Prior Function                       Extremity/Trunk Assessment   Upper Extremity Assessment Upper Extremity Assessment: Defer to OT evaluation    Lower Extremity Assessment Lower Extremity Assessment: RLE deficits/detail RLE Deficits / Details: leg with wound vac, shrinker,  limb protector; pt lifting and demonstrating AROM WFL, though tight hamstrings noted with sitting with leg extended       Communication   Communication Communication: No apparent difficulties    Cognition Arousal: Alert Behavior During Therapy: Flat affect   PT - Cognitive impairments: Safety/Judgement, Problem solving                       PT - Cognition Comments: slow with processing commands, mod cues and assist for safety with mobility Following commands: Intact Following commands impaired: Follows one step commands with increased time     Cueing       General Comments      Exercises     Assessment/Plan    PT Assessment Patient needs continued PT services  PT Problem  List Decreased strength;Decreased mobility;Decreased activity tolerance;Decreased balance;Decreased safety awareness;Pain       PT Treatment Interventions Therapeutic exercise;Balance training;Functional mobility training;Therapeutic activities;Patient/family education;Wheelchair mobility training;DME instruction    PT Goals (Current goals can be found in the Care Plan section)  Acute Rehab PT Goals Patient Stated Goal: agrees to rehab PT Goal Formulation: With patient Time For Goal Achievement: 01/30/24 Potential to Achieve Goals: Fair    Frequency Min 2X/week     Co-evaluation PT/OT/SLP Co-Evaluation/Treatment: Yes Reason for Co-Treatment: For patient/therapist safety;To address functional/ADL transfers PT goals addressed during session: Mobility/safety with mobility;Balance         AM-PAC PT "6 Clicks" Mobility  Outcome Measure Help needed turning from your back to your side while in a flat bed without using bedrails?: A Little Help needed moving from lying on your back to sitting on the side of a flat bed without using bedrails?: A Lot Help needed moving to and from a bed to a chair (including a wheelchair)?: Total Help needed standing up from a chair using your arms (e.g., wheelchair or bedside chair)?: Total Help needed to walk in Webb room?: Total Help needed climbing 3-5 steps with a railing? : Total 6 Click Score: 9    End of Session Equipment Utilized During Treatment: Gait belt;Other (comment) (R limb protector; wound vac) Activity Tolerance: Patient limited by fatigue Patient left: in chair;with call bell/phone within reach;with chair alarm set   PT Visit Diagnosis: Muscle weakness (generalized) (M62.81);Other abnormalities of gait and mobility (R26.89)    Time: 2536-6440 PT Time Calculation (min) (ACUTE ONLY): 24 min   Charges:   PT Evaluation $PT Re-evaluation: 1 Re-eval   PT General Charges $$ ACUTE PT VISIT: 1 Visit         William Webb,  PT Acute Rehabilitation Services [phone] 01/16/2024   William Webb 01/16/2024, 4:05 PM

## 2024-01-16 NOTE — Progress Notes (Signed)
 PT Note:  Assisted staff for pt back to bed as pt c/o fatigue and pain on buttocks.  Repositioned off bottom in supine and applied cream with NT help.      01/16/24 1645  PT Visit Information  Last PT Received On 01/16/24  Assistance Needed +2  History of Present Illness Pt is a 73 y/o male presenting on 3/10 with chronic R foot wound, lethargy. 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  PMH includes: ESRD HD MWF, DM2, HTN, L BKA.  Subjective Data  Patient Stated Goal agrees to rehab  Precautions  Precautions Fall  Recall of Precautions/Restrictions Intact  Precaution/Restrictions Comments chronic L BKA, R BKA with limb protector, wound vac  Required Braces or Orthoses Other Brace  Other Brace limb protector R LE (wound vac)  Restrictions  Weight Bearing Restrictions Per Provider Order Yes  RLE Weight Bearing Per Provider Order NWB  Pain Assessment  Faces Pain Scale 6  Pain Location bottom from sitting  Pain Descriptors / Indicators Pressure;Sore  Pain Intervention(s) Repositioned  Cognition  Arousal Alert  Behavior During Therapy Flat affect  PT - Cognitive impairments Safety/Judgement;Problem solving  Following Commands  Following commands Intact  Following commands impaired Follows one step commands with increased time  Communication  Communication No apparent difficulties  Bed Mobility  Overal bed mobility Needs Assistance  Bed Mobility Sit to Supine  Sit to supine Mod assist;+2 for physical assistance  General bed mobility comments assist for legs onto bed and scooting for positioning; pillow placed to keep off back  Transfers  Overall transfer level Needs assistance  Transfers Bed to chair/wheelchair/BSC   Lateral/Scoot Transfers Max assist;+2 physical assistance  General transfer comment assist to bed with NT using bed pad to scoot from drop arm recliner and A to keep anterior weight shift  Balance  Overall balance assessment Needs assistance   Sitting balance-Leahy Scale Poor  Sitting balance - Comments mod A to keep anterior weight shift  PT - End of Session  Activity Tolerance Patient limited by fatigue  Patient left in bed;with bed alarm set;with call bell/phone within reach   PT - Assessment/Plan  PT Visit Diagnosis Muscle weakness (generalized) (M62.81);Other abnormalities of gait and mobility (R26.89)  PT Frequency (ACUTE ONLY) Min 2X/week  Follow Up Recommendations Skilled nursing-short term rehab (<3 hours/day)  Can patient physically be transported by private vehicle No  Patient can return home with the following A lot of help with walking and/or transfers;A lot of help with bathing/dressing/bathroom;Assist for transportation;Help with stairs or ramp for entrance  PT equipment None recommended by PT  AM-PAC PT "6 Clicks" Mobility Outcome Measure (Version 2)  Help needed turning from your back to your side while in a flat bed without using bedrails? 3  Help needed moving from lying on your back to sitting on the side of a flat bed without using bedrails? 1  Help needed moving to and from a bed to a chair (including a wheelchair)? 1  Help needed standing up from a chair using your arms (e.g., wheelchair or bedside chair)? 1  Help needed to walk in hospital room? 1  Help needed climbing 3-5 steps with a railing?  1  6 Click Score 8  Consider Recommendation of Discharge To: CIR/SNF/LTACH  Progressive Mobility  What is the highest level of mobility based on the progressive mobility assessment? Level 2 (Chairfast) - Balance while sitting on edge of bed and cannot stand  PT Time Calculation  PT Start Time (ACUTE ONLY) 1534  PT Stop Time (ACUTE ONLY) 1548  PT Time Calculation (min) (ACUTE ONLY) 14 min  PT General Charges  $$ ACUTE PT VISIT 1 Visit  PT Treatments  $Therapeutic Activity 8-22 mins   Sheran Lawless, PT Acute Rehabilitation Services [phone] 01/16/2024

## 2024-01-17 DIAGNOSIS — L089 Local infection of the skin and subcutaneous tissue, unspecified: Secondary | ICD-10-CM | POA: Diagnosis not present

## 2024-01-17 DIAGNOSIS — E11628 Type 2 diabetes mellitus with other skin complications: Secondary | ICD-10-CM | POA: Diagnosis not present

## 2024-01-17 LAB — CBC WITH DIFFERENTIAL/PLATELET
Abs Immature Granulocytes: 0.04 10*3/uL (ref 0.00–0.07)
Basophils Absolute: 0.1 10*3/uL (ref 0.0–0.1)
Basophils Relative: 1 %
Eosinophils Absolute: 0.3 10*3/uL (ref 0.0–0.5)
Eosinophils Relative: 3 %
HCT: 41.2 % (ref 39.0–52.0)
Hemoglobin: 12.8 g/dL — ABNORMAL LOW (ref 13.0–17.0)
Immature Granulocytes: 0 %
Lymphocytes Relative: 8 %
Lymphs Abs: 0.7 10*3/uL (ref 0.7–4.0)
MCH: 27.6 pg (ref 26.0–34.0)
MCHC: 31.1 g/dL (ref 30.0–36.0)
MCV: 88.8 fL (ref 80.0–100.0)
Monocytes Absolute: 0.9 10*3/uL (ref 0.1–1.0)
Monocytes Relative: 10 %
Neutro Abs: 7.1 10*3/uL (ref 1.7–7.7)
Neutrophils Relative %: 78 %
Platelets: 249 10*3/uL (ref 150–400)
RBC: 4.64 MIL/uL (ref 4.22–5.81)
RDW: 15.2 % (ref 11.5–15.5)
WBC: 9.1 10*3/uL (ref 4.0–10.5)
nRBC: 0 % (ref 0.0–0.2)

## 2024-01-17 LAB — GLUCOSE, CAPILLARY
Glucose-Capillary: 107 mg/dL — ABNORMAL HIGH (ref 70–99)
Glucose-Capillary: 154 mg/dL — ABNORMAL HIGH (ref 70–99)
Glucose-Capillary: 92 mg/dL (ref 70–99)

## 2024-01-17 LAB — SURGICAL PATHOLOGY

## 2024-01-17 LAB — RENAL FUNCTION PANEL
Albumin: 2.4 g/dL — ABNORMAL LOW (ref 3.5–5.0)
Anion gap: 19 — ABNORMAL HIGH (ref 5–15)
BUN: 44 mg/dL — ABNORMAL HIGH (ref 8–23)
CO2: 21 mmol/L — ABNORMAL LOW (ref 22–32)
Calcium: 9.2 mg/dL (ref 8.9–10.3)
Chloride: 94 mmol/L — ABNORMAL LOW (ref 98–111)
Creatinine, Ser: 6.25 mg/dL — ABNORMAL HIGH (ref 0.61–1.24)
GFR, Estimated: 9 mL/min — ABNORMAL LOW (ref >60)
Glucose, Bld: 112 mg/dL — ABNORMAL HIGH (ref 70–99)
Phosphorus: 5.3 mg/dL — ABNORMAL HIGH (ref 2.5–4.6)
Potassium: 3.7 mmol/L (ref 3.5–5.1)
Sodium: 134 mmol/L — ABNORMAL LOW (ref 135–145)

## 2024-01-17 MED ORDER — PENTAFLUOROPROP-TETRAFLUOROETH EX AERO: 1.0000 | INHALATION_SPRAY | CUTANEOUS | Status: DC | PRN

## 2024-01-17 MED ORDER — HEPARIN SODIUM (PORCINE) 1000 UNIT/ML DIALYSIS: 1000.0000 [IU] | INTRAMUSCULAR | Status: DC | PRN

## 2024-01-17 MED ORDER — LIDOCAINE-PRILOCAINE 2.5-2.5 % EX CREA: 1.0000 | TOPICAL_CREAM | CUTANEOUS | Status: DC | PRN

## 2024-01-17 MED ORDER — LIDOCAINE HCL (PF) 1 % IJ SOLN: 5.0000 mL | INTRAMUSCULAR | Status: DC | PRN

## 2024-01-17 MED ORDER — ALTEPLASE 2 MG IJ SOLR: 2.0000 mg | Freq: Once | INTRAMUSCULAR | Status: DC | PRN

## 2024-01-17 NOTE — Progress Notes (Signed)
   01/17/24 1329  Vitals  Pulse Rate 80  Resp 12  BP (!) 96/47  SpO2 100 %  O2 Device Room Air  Oxygen Therapy  Patient Activity (if Appropriate) In bed  Pulse Oximetry Type Continuous  Oximetry Probe Site Changed No  During Treatment Monitoring  Blood Flow Rate (mL/min) 0 mL/min  Arterial Pressure (mmHg) -0.4 mmHg  Venous Pressure (mmHg) -0.61 mmHg  TMP (mmHg) 1.82 mmHg  Ultrafiltration Rate (mL/min) 404 mL/min  Dialysate Flow Rate (mL/min) 0 ml/min  Dialysate Potassium Concentration 3  Dialysate Calcium Concentration 2.5  Duration of HD Treatment -hour(s) 3.5 hour(s)  Cumulative Fluid Removed (mL) per Treatment  500.09  HD Safety Checks Performed Yes  Intra-Hemodialysis Comments Tx completed   Received patient in bed to unit.  Alert and oriented.  Informed consent signed and in chart.   TX duration: 3.5 hours  Patient tolerated well.  Transported back to the room  Alert, without acute distress.  Hand-off given to patient's nurse.   Access used: LAVF Access issues: None  Total UF removed: 500 Medication(s) given: See Wanda Plump, LPN  [facility]

## 2024-01-17 NOTE — Progress Notes (Signed)
 [facility] Progress Note   Subjective:    Seen in dialysis. Does endorse pain in stump this am. Continued diarrhea.   Objective Vitals:   01/16/24 1553 01/16/24 2038 01/17/24 0409 01/17/24 0852  BP: (!) 84/50 125/68 (!) 156/49 124/63  Pulse: 73 72 76 78  Resp: 16 18 18 18   Temp: 98 F (36.7 C) 97.6 F (36.4 C) 97.7 F (36.5 C) 97.7 F (36.5 C)  TempSrc:      SpO2: 99% 100% 99% 98%  Weight:    64.9 kg  Height:       Physical Exam General: Alert, nad  Heart: RRR Lungs: Clear bilaterally  Abdomen: Soft, non-distended Extremities: L BKA, R BKA in stump protector  Dialysis Access:  LUE AVF + bruit  Filed Weights   01/15/24 1223 01/15/24 1332 01/17/24 0852  Weight: 68.7 kg 69.7 kg 64.9 kg   No intake or output data in the 24 hours ending 01/17/24 0917   Additional Objective Labs: Basic Metabolic Panel: Recent Labs  Lab 01/12/24 1834 01/15/24 0802 01/15/24 1344 01/17/24 0514  NA 133* 133* 133* 134*  K 3.4* 3.0* 3.2* 3.7  CL 91* 92* 93* 94*  CO2 24 25  --  21*  GLUCOSE 118* 108* 186* 112*  BUN 34* 35* 17 44*  CREATININE 7.05* 6.99* 4.10* 6.25*  CALCIUM 8.4* 8.9  --  9.2  PHOS 4.4 5.7*  --  5.3*   Liver Function Tests: Recent Labs  Lab 01/12/24 1834 01/15/24 0802 01/17/24 0514  ALBUMIN 2.2* 2.1* 2.4*   No results for input(s): "LIPASE", "AMYLASE" in the last 168 hours. CBC: Recent Labs  Lab 01/15/24 0500 01/15/24 1344 01/17/24 0514  WBC 11.4*  --  9.1  NEUTROABS 9.5*  --  7.1  HGB 11.8* 14.6 12.8*  HCT 37.3* 43.0 41.2  MCV 88.8  --  88.8  PLT 283  --  249   Blood Culture    Component Value Date/Time   SDES BLOOD RIGHT HAND 01/06/2024 2138   SPECREQUEST  01/06/2024 2138    BOTTLES DRAWN AEROBIC AND ANAEROBIC Blood Culture results may not be optimal due to an inadequate volume of blood received in culture bottles   CULT  01/06/2024 2138    NO GROWTH 5 DAYS Performed at [facility], [address] [place], [place]  [address]    REPTSTATUS 01/11/2024 FINAL 01/06/2024 2138    Cardiac Enzymes: No results for input(s): "CKTOTAL", "CKMB", "CKMBINDEX", "TROPONINI" in the last 168 hours. CBG: Recent Labs  Lab 01/15/24 2045 01/16/24 0755 01/16/24 1553 01/16/24 2055 01/17/24 0745  GLUCAP 165* 155* 137* 115* 107*   Iron Studies: No results for input(s): "IRON", "TIBC", "TRANSFERRIN", "FERRITIN" in the last 72 hours. Lab Results  Component Value Date   INR 1.3 (H) 10/19/2023   INR 1.4 (H) 05/20/2023   INR 1.4 (H) 03/21/2023   Studies/Results: No results found.  Medications:  sodium chloride      (feeding supplement) PROSource Plus  30 mL Oral BID BM   apixaban  5 mg Oral BID   vitamin C  1,000 mg Oral Daily   aspirin EC  81 mg Oral Daily   atorvastatin  80 mg Oral Daily   Chlorhexidine Gluconate Cloth  6 each Topical Q0600   doxercalciferol  2 mcg Intravenous Q M,W,F-HD   feeding supplement  237 mL Oral BID BM   finasteride  5 mg Oral Daily   gabapentin  300 mg Oral BID  insulin aspart  0-5 Units Subcutaneous QHS   insulin aspart  0-9 Units Subcutaneous TID WC   mupirocin ointment   Nasal BID   nutrition supplement (JUVEN)  1 packet Oral BID BM   saccharomyces boulardii  250 mg Oral BID   sevelamer carbonate  1,600 mg Oral TID WC   tamsulosin  0.4 mg Oral QHS   zinc sulfate (50mg  elemental zinc)  220 mg Oral Daily    Dialysis Orders: AF MWF 4:00 425/800 EDW 80.4kg 2K/2Ca AVF No heparin  Post HD wt 3/10 - 75.4 kg  -Mircera 225 q 2 weeks (last 3/3) -Hectorol 2 q HD   Assessment/Plan: Sepsis/R foot wound - 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 ESRD -  HD MWF. Continue on schedule. K+ running low, use 4K bath. Next HD 3/21. Hypertension/volume  - BP ok. Well below EDW. Plan to lower at discharge. Anemia  - Hgb 12.8 No ESA needs.   Metabolic bone disease -  Calcium acceptable. Phos at goal. Continue home meds  PAFib- on Eliquis  T2DM - per primary  Diarrhea - receiving Imodium per primary   Nutrition - Renal diet, fluid restriction. Prot supp for low albumin  Dispo - pending rehab plans   Tomasa Blase PA-C [facility] 01/17/2024,9:18 AM

## 2024-01-17 NOTE — Addendum Note (Signed)
 Addendum  created 01/17/24 0025 by Bethena Midget, MD   Clinical Note Signed, Intraprocedure Blocks edited, SmartForm saved

## 2024-01-17 NOTE — Progress Notes (Signed)
 PROGRESS NOTE  William Webb [facility]  DOB: Apr 03, 1951  PCP: Irven Coe, MD HYQ:[id number]  DOA: 01/06/2024  LOS: 8 days  Hospital Day: 12  Brief narrative: William Webb is a 73 y.o. male with PMH significant for ESRD-HD-MWF, DM2, HTN, HLD, CAD, CHF, paroxysmal A-fib, PAD, diabetic neuropathy s/p prior left BKA.  He also has a right foot wound that continue to worsen despite endovascular intervention in January. 3/10, patient presented to the ED with complaint of 2 to 3 days of fever, generalized weakness and increased drainage from chronic right foot wound X-ray of right foot was concerning for osteomyelitis. MRI right foot also suggested osteomyelitis  Subjective: Patient was seen and examined this morning in dialysis. Lying on bed.  Not in distress. 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 Lajoyce Corners Orthopedics evaluated and was concerned patient may require right BKA - patient/family understandably wanted to wait until his primary orthopedist, Dr. Lajoyce Corners could evaluate the wound. Dr. Lajoyce Corners saw the patient 3/17 and is recommended right transtibial amputation which he underwent on 3/19.   ESRD-HD-MWF Nephrology involved.   Antibiotic associated diarrhea No clinical symptoms to suggest C. difficile  continue probiotic and as needed Imodium - improved per patient report   Paroxysmal A-fib Continue Coreg. Chronically anticoagulated with Eliquis.  Resumed after surgery.  Chronic diastolic CHF HTN Volume status stable on dialysis PTA meds- Carvedilol, Imdur.  Currently both on hold because of low blood pressure.   CAD, HLD Continue aspirin, Lipitor  Type 2 diabetes mellitus Hypoglycemia A1c 6.3 on 01/14/2024 PTA meds-Premeal sliding scale Currently on SSI/Accu-Cheks. Recent Labs  Lab 01/15/24 2045 01/16/24 0755 01/16/24 1553 01/16/24 2055 01/17/24 0745   GLUCAP 165* 155* 137* 115* 107*   BPH Continue finasteride and Flomax   Mobility: PT/OT eval obtained.   Goals of care   Code Status: Full Code     DVT prophylaxis:  SCD's Start: 01/15/24 1648 SCDs Start: 01/07/24 0018 apixaban (ELIQUIS) tablet 5 mg   Antimicrobials: Completed the course of IV antibiotics.   Fluid: None Consultants: Orthopedics, nephrology Family Communication: None at bedside  Status: Inpatient Level of care:  Med-Surg   Patient is from: Home Needs to continue in-hospital care: Medically stable for discharge Anticipated d/c to:  needs placement.  Pending insurance authorization      Diet:  Diet Order             Diet renal with fluid restriction Fluid restriction: 1200 mL Fluid; Room service appropriate? Yes; Fluid consistency: Thin  Diet effective now                   Scheduled Meds:  (feeding supplement) PROSource Plus  30 mL Oral BID BM   apixaban  5 mg Oral BID   vitamin C  1,000 mg Oral Daily   aspirin EC  81 mg Oral Daily   atorvastatin  80 mg Oral Daily   Chlorhexidine Gluconate Cloth  6 each Topical Q0600   doxercalciferol  2 mcg Intravenous Q M,W,F-HD   feeding supplement  237 mL Oral BID BM   finasteride  5 mg Oral Daily   gabapentin  300 mg Oral BID   insulin aspart  0-5 Units Subcutaneous QHS   insulin aspart  0-9 Units Subcutaneous TID WC   mupirocin ointment   Nasal BID   nutrition supplement (JUVEN)  1 packet Oral BID BM  saccharomyces boulardii  250 mg Oral BID   sevelamer carbonate  1,600 mg Oral TID WC   tamsulosin  0.4 mg Oral QHS   zinc sulfate (50mg  elemental zinc)  220 mg Oral Daily    PRN meds: acetaminophen **OR** [DISCONTINUED] acetaminophen, HYDROmorphone (DILAUDID) injection, linaclotide, loperamide, ondansetron **OR** ondansetron (ZOFRAN) IV, oxyCODONE   Infusions:   sodium chloride      Antimicrobials: Anti-infectives (From admission, onward)    Start     Dose/Rate Route Frequency Ordered  Stop   01/15/24 0600  ceFAZolin (ANCEF) IVPB 2g/100 mL premix        2 g 200 mL/hr over 30 Minutes Intravenous On call to O.R. 01/14/24 2130 01/15/24 1510   01/15/24 0600  vancomycin (VANCOCIN) IVPB 1000 mg/200 mL premix  Status:  Discontinued        1,000 mg 200 mL/hr over 60 Minutes Intravenous On call to O.R. 01/14/24 2130 01/15/24 0836   01/10/24 1200  vancomycin (VANCOREADY) IVPB 750 mg/150 mL        750 mg 150 mL/hr over 60 Minutes Intravenous Every M-W-F (Hemodialysis) 01/08/24 1813 01/15/24 1150   01/08/24 1900  vancomycin (VANCOREADY) IVPB 1750 mg/350 mL        1,750 mg 175 mL/hr over 120 Minutes Intravenous  Once 01/08/24 1813 01/09/24 0231   01/07/24 0100  piperacillin-tazobactam (ZOSYN) IVPB 2.25 g        2.25 g 100 mL/hr over 30 Minutes Intravenous Every 8 hours 01/07/24 0031 01/16/24 2359   01/06/24 2330  Ampicillin-Sulbactam (UNASYN) 3 g in sodium chloride 0.9 % 100 mL IVPB  Status:  Discontinued       Placed in "And" Linked Group   3 g 200 mL/hr over 30 Minutes Intravenous  Once 01/06/24 2326 01/07/24 0019   01/06/24 2330  vancomycin (VANCOCIN) IVPB 1000 mg/200 mL premix       Placed in "And" Linked Group   1,000 mg 200 mL/hr over 60 Minutes Intravenous  Once 01/06/24 2326 01/07/24 0053       Objective: Vitals:   01/17/24 1329 01/17/24 1509  BP: (!) 96/47 (!) 91/58  Pulse: 80 86  Resp: 12 18  Temp:  98.6 F (37 C)  SpO2: 100% 99%    Intake/Output Summary (Last 24 hours) at 01/17/2024 1513 Last data filed at 01/17/2024 1328 Gross per 24 hour  Intake --  Output 500 ml  Net -500 ml   Filed Weights   01/15/24 1332 01/17/24 0852 01/17/24 1328  Weight: 69.7 kg 64.9 kg 64.3 kg   Weight change:  Body mass index is 17.72 kg/m.   Physical Exam: General exam: Pleasant, elderly African-American male Skin: No rashes, lesions or ulcers. HEENT: Atraumatic, normocephalic, no obvious bleeding Lungs: Clear to auscultation bilaterally,  CVS: S1, S2, no murmur,    GI/Abd: Soft, nontender, nondistended, bowel sound present,   CNS: Alert, awake, oriented x 3 Psychiatry: Sad affect Extremities: No pedal edema, no calf tenderness, left prior BKA status.  New right AKA status.  Data Review: I have personally reviewed the laboratory data and studies available.  F/u labs  Unresulted Labs (From admission, onward)    None       Total time spent in review of labs and imaging, patient evaluation, formulation of plan, documentation and communication with family: 25 minutes  Signed, Lorin Glass, MD [facility] 01/17/2024

## 2024-01-18 DIAGNOSIS — E11628 Type 2 diabetes mellitus with other skin complications: Secondary | ICD-10-CM | POA: Diagnosis not present

## 2024-01-18 DIAGNOSIS — L089 Local infection of the skin and subcutaneous tissue, unspecified: Secondary | ICD-10-CM | POA: Diagnosis not present

## 2024-01-18 LAB — GLUCOSE, CAPILLARY
Glucose-Capillary: 109 mg/dL — ABNORMAL HIGH (ref 70–99)
Glucose-Capillary: 161 mg/dL — ABNORMAL HIGH (ref 70–99)
Glucose-Capillary: 153 mg/dL — ABNORMAL HIGH (ref 70–99)
Glucose-Capillary: 174 mg/dL — ABNORMAL HIGH (ref 70–99)
Glucose-Capillary: 197 mg/dL — ABNORMAL HIGH (ref 70–99)

## 2024-01-18 MED ORDER — GERHARDT'S BUTT CREAM
1.0000 | TOPICAL_CREAM | Freq: Two times a day (BID) | CUTANEOUS | Status: DC
  Administered 2024-01-18 – 2024-01-23 (×12): 1 via TOPICAL
  Filled 2024-01-18: qty 60

## 2024-01-18 NOTE — Progress Notes (Signed)
 [facility] night cross cover note:   Order for scheduled Gerhardt's Butt cream added for topical application to affected area involving the sacrum.    Newton Pigg, DO Hospitalist

## 2024-01-18 NOTE — Progress Notes (Signed)
 [facility] Progress Note   Subjective:    Seen in room. Completed dialysis yesterday with minimal UF. No complaints today.   Objective Vitals:   01/17/24 1509 01/17/24 2016 01/18/24 0330 01/18/24 0900  BP: (!) 91/58 (!) 96/47 126/65 (!) 129/100  Pulse: 86  74 69  Resp: 18 18 18 17   Temp: 98.6 F (37 C) 98.4 F (36.9 C) 97.9 F (36.6 C) 98.2 F (36.8 C)  TempSrc: Oral Oral  Oral  SpO2: 99% 90% 100% 96%  Weight:      Height:       Physical Exam General: Alert, nad  Heart: RRR Lungs: Clear bilaterally  Abdomen: Soft, non-distended Extremities: L BKA, R BKA in stump protector  Dialysis Access:  LUE AVF + bruit  Filed Weights   01/15/24 1332 01/17/24 0852 01/17/24 1328  Weight: 69.7 kg 64.9 kg 64.3 kg    Intake/Output Summary (Last 24 hours) at 01/18/2024 1214 Last data filed at 01/17/2024 1328 Gross per 24 hour  Intake --  Output 500 ml  Net -500 ml     Additional Objective Labs: Basic Metabolic Panel: Recent Labs  Lab 01/12/24 1834 01/15/24 0802 01/15/24 1344 01/17/24 0514  NA 133* 133* 133* 134*  K 3.4* 3.0* 3.2* 3.7  CL 91* 92* 93* 94*  CO2 24 25  --  21*  GLUCOSE 118* 108* 186* 112*  BUN 34* 35* 17 44*  CREATININE 7.05* 6.99* 4.10* 6.25*  CALCIUM 8.4* 8.9  --  9.2  PHOS 4.4 5.7*  --  5.3*   Liver Function Tests: Recent Labs  Lab 01/12/24 1834 01/15/24 0802 01/17/24 0514  ALBUMIN 2.2* 2.1* 2.4*   No results for input(s): "LIPASE", "AMYLASE" in the last 168 hours. CBC: Recent Labs  Lab 01/15/24 0500 01/15/24 1344 01/17/24 0514  WBC 11.4*  --  9.1  NEUTROABS 9.5*  --  7.1  HGB 11.8* 14.6 12.8*  HCT 37.3* 43.0 41.2  MCV 88.8  --  88.8  PLT 283  --  249   Blood Culture    Component Value Date/Time   SDES BLOOD RIGHT HAND 01/06/2024 2138   SPECREQUEST  01/06/2024 2138    BOTTLES DRAWN AEROBIC AND ANAEROBIC Blood Culture results may not be optimal due to an inadequate volume of blood received in culture bottles   CULT   01/06/2024 2138    NO GROWTH 5 DAYS Performed at [facility], [address] [place], [place] [address]    REPTSTATUS 01/11/2024 FINAL 01/06/2024 2138    Cardiac Enzymes: No results for input(s): "CKTOTAL", "CKMB", "CKMBINDEX", "TROPONINI" in the last 168 hours. CBG: Recent Labs  Lab 01/17/24 0745 01/17/24 1621 01/17/24 2131 01/18/24 0720 01/18/24 1140  GLUCAP 107* 154* 92 109* 161*   Iron Studies: No results for input(s): "IRON", "TIBC", "TRANSFERRIN", "FERRITIN" in the last 72 hours. Lab Results  Component Value Date   INR 1.3 (H) 10/19/2023   INR 1.4 (H) 05/20/2023   INR 1.4 (H) 03/21/2023   Studies/Results: No results found.  Medications:  sodium chloride      (feeding supplement) PROSource Plus  30 mL Oral BID BM   apixaban  5 mg Oral BID   vitamin C  1,000 mg Oral Daily   aspirin EC  81 mg Oral Daily   atorvastatin  80 mg Oral Daily   Chlorhexidine Gluconate Cloth  6 each Topical Q0600   doxercalciferol  2 mcg Intravenous Q M,W,F-HD   feeding supplement  237 mL  Oral BID BM   finasteride  5 mg Oral Daily   gabapentin  300 mg Oral BID   Gerhardt's butt cream  1 Application Topical BID   insulin aspart  0-5 Units Subcutaneous QHS   insulin aspart  0-9 Units Subcutaneous TID WC   mupirocin ointment   Nasal BID   nutrition supplement (JUVEN)  1 packet Oral BID BM   saccharomyces boulardii  250 mg Oral BID   sevelamer carbonate  1,600 mg Oral TID WC   tamsulosin  0.4 mg Oral QHS   zinc sulfate (50mg  elemental zinc)  220 mg Oral Daily    Dialysis Orders: AF MWF 4:00 425/800 EDW 80.4kg 2K/2Ca AVF No heparin  Post HD wt 3/10 - 75.4 kg  -Mircera 225 q 2 weeks (last 3/3) -Hectorol 2 q HD   Assessment/Plan: Sepsis/R foot wound - 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 ESRD -  HD MWF. Using added K bath. Next HD Monday.  Hypertension/volume  - BP ok. Well below EDW. Plan to lower at discharge. Anemia  - Hgb 12.8 No ESA needs.   Metabolic bone disease -  Calcium  acceptable. Phos at goal. Continue home meds  PAFib- on Eliquis  T2DM - per primary  Diarrhea - receiving Imodium per primary  Nutrition - Renal diet, fluid restriction. Prot supp for low albumin  Dispo - pending rehab plans   William Blase PA-C [facility] 01/18/2024,12:14 PM

## 2024-01-18 NOTE — Progress Notes (Signed)
 PROGRESS NOTE  William Webb [facility]  DOB: 02-Oct-1951  PCP: Irven Coe, MD JYN:[id number]  DOA: 01/06/2024  LOS: 9 days  Hospital Day: 13  Brief narrative: William Webb is a 73 y.o. male with PMH significant for ESRD-HD-MWF, DM2, HTN, HLD, CAD, CHF, paroxysmal A-fib, PAD, diabetic neuropathy s/p prior left BKA.  He also has a right foot wound that continue to worsen despite endovascular intervention in January. 3/10, patient presented to the ED with complaint of 2 to 3 days of fever, generalized weakness and increased drainage from chronic right foot wound X-ray of right foot was concerning for osteomyelitis. MRI right foot also suggested osteomyelitis.  Orthopedics was consulted.  Recommended right BKA but family wanted to wait for Dr. Lajoyce Corners. Seen by Dr. Lajoyce Corners on 3/17.  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  Subjective: Patient was seen and examined this morning in dialysis. Lying on bed.  Not in distress. 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 Lajoyce Corners Wound VAC in place.  At discharge, to continue Prevena plus portable wound VAC pump PT eval obtained to SNF recommended.   ESRD-HD-MWF Nephrology involved.   Antibiotic associated diarrhea No clinical symptoms to suggest C. difficile  continue probiotic and as needed Imodium - improved per patient report   Paroxysmal A-fib Continue Coreg. Chronically anticoagulated with Eliquis.  Resumed after surgery.  Chronic diastolic CHF HTN Volume status stable on dialysis PTA meds- Carvedilol, Imdur.  Currently both on hold because of low blood pressure.   CAD, HLD Continue aspirin, Lipitor  Type 2 diabetes mellitus Hypoglycemia A1c 6.3 on 01/14/2024 PTA meds-Premeal sliding scale Currently on SSI/Accu-Cheks. Recent Labs  Lab 01/17/24 0745 01/17/24 1621 01/17/24 2131 01/18/24 0720 01/18/24 1140  GLUCAP 107*  154* 92 109* 161*   BPH Continue finasteride and Flomax   Mobility: PT/OT eval obtained.   Goals of care   Code Status: Full Code     DVT prophylaxis:  SCD's Start: 01/15/24 1648 SCDs Start: 01/07/24 0018 apixaban (ELIQUIS) tablet 5 mg   Antimicrobials: Completed the course of IV antibiotics.   Fluid: None Consultants: Orthopedics, nephrology Family Communication: None at bedside  Status: Inpatient Level of care:  Med-Surg   Patient is from: Home Needs to continue in-hospital care: Medically stable for discharge Anticipated d/c to:  needs placement.  Pending insurance authorization      Diet:  Diet Order             Diet renal with fluid restriction Fluid restriction: 1200 mL Fluid; Room service appropriate? Yes; Fluid consistency: Thin  Diet effective now                   Scheduled Meds:  (feeding supplement) PROSource Plus  30 mL Oral BID BM   apixaban  5 mg Oral BID   vitamin C  1,000 mg Oral Daily   aspirin EC  81 mg Oral Daily   atorvastatin  80 mg Oral Daily   Chlorhexidine Gluconate Cloth  6 each Topical Q0600   doxercalciferol  2 mcg Intravenous Q M,W,F-HD   feeding supplement  237 mL Oral BID BM   finasteride  5 mg Oral Daily   gabapentin  300 mg Oral BID   Gerhardt's butt cream  1 Application Topical BID   insulin aspart  0-5 Units Subcutaneous QHS   insulin aspart  0-9 Units Subcutaneous TID WC   mupirocin ointment  Nasal BID   nutrition supplement (JUVEN)  1 packet Oral BID BM   saccharomyces boulardii  250 mg Oral BID   sevelamer carbonate  1,600 mg Oral TID WC   tamsulosin  0.4 mg Oral QHS   zinc sulfate (50mg  elemental zinc)  220 mg Oral Daily    PRN meds: acetaminophen **OR** [DISCONTINUED] acetaminophen, HYDROmorphone (DILAUDID) injection, linaclotide, loperamide, ondansetron **OR** ondansetron (ZOFRAN) IV, oxyCODONE   Infusions:   sodium chloride      Antimicrobials: Anti-infectives (From admission, onward)    Start      Dose/Rate Route Frequency Ordered Stop   01/15/24 0600  ceFAZolin (ANCEF) IVPB 2g/100 mL premix        2 g 200 mL/hr over 30 Minutes Intravenous On call to O.R. 01/14/24 2130 01/15/24 1510   01/15/24 0600  vancomycin (VANCOCIN) IVPB 1000 mg/200 mL premix  Status:  Discontinued        1,000 mg 200 mL/hr over 60 Minutes Intravenous On call to O.R. 01/14/24 2130 01/15/24 0836   01/10/24 1200  vancomycin (VANCOREADY) IVPB 750 mg/150 mL        750 mg 150 mL/hr over 60 Minutes Intravenous Every M-W-F (Hemodialysis) 01/08/24 1813 01/15/24 1150   01/08/24 1900  vancomycin (VANCOREADY) IVPB 1750 mg/350 mL        1,750 mg 175 mL/hr over 120 Minutes Intravenous  Once 01/08/24 1813 01/09/24 0231   01/07/24 0100  piperacillin-tazobactam (ZOSYN) IVPB 2.25 g        2.25 g 100 mL/hr over 30 Minutes Intravenous Every 8 hours 01/07/24 0031 01/16/24 2359   01/06/24 2330  Ampicillin-Sulbactam (UNASYN) 3 g in sodium chloride 0.9 % 100 mL IVPB  Status:  Discontinued       Placed in "And" Linked Group   3 g 200 mL/hr over 30 Minutes Intravenous  Once 01/06/24 2326 01/07/24 0019   01/06/24 2330  vancomycin (VANCOCIN) IVPB 1000 mg/200 mL premix       Placed in "And" Linked Group   1,000 mg 200 mL/hr over 60 Minutes Intravenous  Once 01/06/24 2326 01/07/24 0053       Objective: Vitals:   01/18/24 0330 01/18/24 0900  BP: 126/65 (!) 129/100  Pulse: 74 69  Resp: 18 17  Temp: 97.9 F (36.6 C) 98.2 F (36.8 C)  SpO2: 100% 96%    Intake/Output Summary (Last 24 hours) at 01/18/2024 1445 Last data filed at 01/18/2024 1300 Gross per 24 hour  Intake 240 ml  Output 0 ml  Net 240 ml   Filed Weights   01/15/24 1332 01/17/24 0852 01/17/24 1328  Weight: 69.7 kg 64.9 kg 64.3 kg   Weight change:  Body mass index is 17.72 kg/m.   Physical Exam: General exam: Pleasant, elderly African-American male Skin: No rashes, lesions or ulcers. HEENT: Atraumatic, normocephalic, no obvious bleeding Lungs: Clear to  auscultation bilaterally,  CVS: S1, S2, no murmur,   GI/Abd: Soft, nontender, nondistended, bowel sound present,   CNS: Alert, awake, oriented x 3 Psychiatry: Sad affect Extremities: No pedal edema, no calf tenderness, left prior BKA status.  New right AKA status.  Data Review: I have personally reviewed the laboratory data and studies available.  F/u labs  Unresulted Labs (From admission, onward)    None       Total time spent in review of labs and imaging, patient evaluation, formulation of plan, documentation and communication with family: 25 minutes  Signed, Lorin Glass, MD [facility] 01/18/2024

## 2024-01-18 NOTE — Progress Notes (Signed)
 Patient ID: William Webb [facility], male   DOB: 1951/08/13, 73 y.o.   MRN: [id number] Patient is postoperative day 3 right below-knee amputation.  There is no drainage in the wound VAC canister.  Patient will discharge on the Prevena plus portable wound VAC pump.

## 2024-01-18 NOTE — TOC Progression Note (Addendum)
 Transition of Care [facility]) - Progression Note    Patient Details  Name: William Webb MRN: [id number] Date of Birth: April 11, 1951  [facility] CM/SW Contact  Dellie Burns [place], [place] Phone Number: 01/18/2024, 11:57 AM  Clinical Narrative:  Provided current SNF offers to pt's sister William Webb who plans to discuss with family and will update SW re choice. Will provide updates as available.  UPDATE 1315: Return call received from pt's sister who reports family would like to accept [facility] where pt has been in the past. Confirmed bed with Marylene Land in [place] admissions. Home and [employer] auth request submitted, reference [id number].    Dellie Burns, MSW, LCSW [phone] (coverage)       Expected Discharge Plan: Home w Home Health Services Barriers to Discharge: Continued Medical Work up  Expected Discharge Plan and Services In-house Referral: Clinical Social Work Discharge Planning Services: CM Consult Post Acute Care Choice: Home Health Living arrangements for the past 2 months: Apartment                                       Social Determinants of Health (SDOH) Interventions SDOH Screenings   Food Insecurity: No Food Insecurity (01/07/2024)  Housing: Low Risk  (01/07/2024)  Transportation Needs: No Transportation Needs (01/07/2024)  Utilities: Not At Risk (01/07/2024)  Depression (PHQ2-9): Low Risk  (10/07/2020)  Social Connections: Socially Isolated (01/07/2024)  Tobacco Use: Medium Risk (01/15/2024)    Readmission Risk Interventions    05/22/2023   11:05 AM 03/22/2023    4:41 PM 03/19/2022    3:43 PM  Readmission Risk Prevention Plan  Transportation Screening Complete Complete Complete  Medication Review (RN Care Manager) Referral to Pharmacy Complete Complete  PCP or Specialist appointment within 3-5 days of discharge Complete Complete Complete  HRI or Home Care Consult Complete Complete Complete  SW Recovery  Care/Counseling Consult Complete Complete Complete  Palliative Care Screening Not Applicable Not Applicable Not Applicable  Skilled Nursing Facility Not Applicable Not Applicable Not Applicable

## 2024-01-18 NOTE — Plan of Care (Signed)

## 2024-01-19 DIAGNOSIS — E11628 Type 2 diabetes mellitus with other skin complications: Secondary | ICD-10-CM | POA: Diagnosis not present

## 2024-01-19 DIAGNOSIS — L089 Local infection of the skin and subcutaneous tissue, unspecified: Secondary | ICD-10-CM | POA: Diagnosis not present

## 2024-01-19 LAB — GLUCOSE, CAPILLARY
Glucose-Capillary: 149 mg/dL — ABNORMAL HIGH (ref 70–99)
Glucose-Capillary: 141 mg/dL — ABNORMAL HIGH (ref 70–99)
Glucose-Capillary: 136 mg/dL — ABNORMAL HIGH (ref 70–99)
Glucose-Capillary: 220 mg/dL — ABNORMAL HIGH (ref 70–99)

## 2024-01-19 MED ORDER — ENSURE ENLIVE PO LIQD
237.0000 mL | Freq: Two times a day (BID) | ORAL | Status: DC
  Administered 2024-01-20 – 2024-01-21 (×3): 237 mL via ORAL

## 2024-01-19 NOTE — Progress Notes (Signed)
 PROGRESS NOTE  [facility]  DOB: [birth year]  PCP: Irven Coe, MD ZOX:[id number]  DOA: 01/06/2024  LOS: 10 days  Hospital Day: 14  Brief narrative: William Webb is a 73 y.o. male with PMH significant for ESRD-HD-MWF, DM2, HTN, HLD, CAD, CHF, paroxysmal A-fib, PAD, diabetic neuropathy s/p prior left BKA.  He also has a right foot wound that continue to worsen despite endovascular intervention in January. 3/10, patient presented to the ED with complaint of 2 to 3 days of fever, generalized weakness and increased drainage from chronic right foot wound X-ray of right foot was concerning for osteomyelitis. MRI right foot also suggested osteomyelitis.  Orthopedics was consulted.  Recommended right BKA but family wanted to wait for Dr. Lajoyce Corners. Seen by Dr. Lajoyce Corners on 3/17.  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  Subjective: Patient was seen and examined this morning. Somnolent.  Opens eyes on command.  Not in distress. Family not at bedside. 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 Lajoyce Corners Wound VAC in place.  At discharge, to continue Prevena plus portable wound VAC pump PT eval obtained.  SNF recommended.   ESRD-HD-MWF Nephrology involved.    Antibiotic associated diarrhea No clinical symptoms to suggest C. difficile  continue probiotic and as needed Imodium - improved per patient report   Paroxysmal A-fib Continue Coreg. Chronically anticoagulated with Eliquis.  Resumed after surgery.  Chronic diastolic CHF HTN Volume status stable on dialysis PTA meds- Carvedilol, Imdur.  Currently both on hold because of low blood pressure.   CAD, HLD Continue aspirin, Lipitor  Type 2 diabetes mellitus Hypoglycemia A1c 6.3 on 01/14/2024 PTA meds-Premeal sliding scale Currently blood sugars controlled on on SSI/Accu-Cheks. Recent Labs  Lab 01/18/24 1140 01/18/24 1634  01/18/24 2114 01/18/24 2200 01/19/24 0752  GLUCAP 161* 153* 174* 197* 149*   BPH Continue finasteride and Flomax   Mobility: PT/OT eval obtained.  SNF recommended  Goals of care   Code Status: Full Code     DVT prophylaxis:  SCD's Start: 01/15/24 1648 SCDs Start: 01/07/24 0018 apixaban (ELIQUIS) tablet 5 mg   Antimicrobials: Completed the course of IV antibiotics.   Fluid: None Consultants: Orthopedics, nephrology Family Communication: None at bedside  Status: Inpatient Level of care:  Med-Surg   Patient is from: Home Needs to continue in-hospital care: Medically stable for discharge Anticipated d/c to:  needs placement.  Pending insurance authorization      Diet:  Diet Order             Diet renal with fluid restriction Fluid restriction: 1200 mL Fluid; Room service appropriate? Yes; Fluid consistency: Thin  Diet effective now                   Scheduled Meds:  (feeding supplement) PROSource Plus  30 mL Oral BID BM   apixaban  5 mg Oral BID   vitamin C  1,000 mg Oral Daily   aspirin EC  81 mg Oral Daily   atorvastatin  80 mg Oral Daily   Chlorhexidine Gluconate Cloth  6 each Topical Q0600   doxercalciferol  2 mcg Intravenous Q M,W,F-HD   feeding supplement  237 mL Oral BID BM   finasteride  5 mg Oral Daily   gabapentin  300 mg Oral BID   Gerhardt's butt cream  1 Application Topical BID   insulin aspart  0-5 Units Subcutaneous QHS  insulin aspart  0-9 Units Subcutaneous TID WC   mupirocin ointment   Nasal BID   nutrition supplement (JUVEN)  1 packet Oral BID BM   saccharomyces boulardii  250 mg Oral BID   sevelamer carbonate  1,600 mg Oral TID WC   tamsulosin  0.4 mg Oral QHS   zinc sulfate (50mg  elemental zinc)  220 mg Oral Daily    PRN meds: acetaminophen **OR** [DISCONTINUED] acetaminophen, HYDROmorphone (DILAUDID) injection, linaclotide, loperamide, ondansetron **OR** ondansetron (ZOFRAN) IV, oxyCODONE   Infusions:   sodium chloride       Antimicrobials: Anti-infectives (From admission, onward)    Start     Dose/Rate Route Frequency Ordered Stop   01/15/24 0600  ceFAZolin (ANCEF) IVPB 2g/100 mL premix        2 g 200 mL/hr over 30 Minutes Intravenous On call to O.R. 01/14/24 2130 01/15/24 1510   01/15/24 0600  vancomycin (VANCOCIN) IVPB 1000 mg/200 mL premix  Status:  Discontinued        1,000 mg 200 mL/hr over 60 Minutes Intravenous On call to O.R. 01/14/24 2130 01/15/24 0836   01/10/24 1200  vancomycin (VANCOREADY) IVPB 750 mg/150 mL        750 mg 150 mL/hr over 60 Minutes Intravenous Every M-W-F (Hemodialysis) 01/08/24 1813 01/15/24 1150   01/08/24 1900  vancomycin (VANCOREADY) IVPB 1750 mg/350 mL        1,750 mg 175 mL/hr over 120 Minutes Intravenous  Once 01/08/24 1813 01/09/24 0231   01/07/24 0100  piperacillin-tazobactam (ZOSYN) IVPB 2.25 g        2.25 g 100 mL/hr over 30 Minutes Intravenous Every 8 hours 01/07/24 0031 01/16/24 2359   01/06/24 2330  Ampicillin-Sulbactam (UNASYN) 3 g in sodium chloride 0.9 % 100 mL IVPB  Status:  Discontinued       Placed in "And" Linked Group   3 g 200 mL/hr over 30 Minutes Intravenous  Once 01/06/24 2326 01/07/24 0019   01/06/24 2330  vancomycin (VANCOCIN) IVPB 1000 mg/200 mL premix       Placed in "And" Linked Group   1,000 mg 200 mL/hr over 60 Minutes Intravenous  Once 01/06/24 2326 01/07/24 0053       Objective: Vitals:   01/19/24 0347 01/19/24 0854  BP: 125/66 (!) 134/58  Pulse: 77 86  Resp: 20   Temp: 98.4 F (36.9 C)   SpO2: 100% 95%    Intake/Output Summary (Last 24 hours) at 01/19/2024 1007 Last data filed at 01/19/2024 0600 Gross per 24 hour  Intake 620 ml  Output 0 ml  Net 620 ml   Filed Weights   01/15/24 1332 01/17/24 0852 01/17/24 1328  Weight: 69.7 kg 64.9 kg 64.3 kg   Weight change:  Body mass index is 17.72 kg/m.   Physical Exam: General exam: Pleasant, elderly African-American male Skin: No rashes, lesions or ulcers. HEENT:  Atraumatic, normocephalic, no obvious bleeding Lungs: Clear to auscultation bilaterally,  CVS: S1, S2, no murmur,   GI/Abd: Soft, nontender, nondistended, bowel sound present,   CNS: Alert, awake, oriented x 3 Psychiatry: Sad affect Extremities: No pedal edema, no calf tenderness, left prior BKA status.  New right AKA status.  Data Review: I have personally reviewed the laboratory data and studies available.  F/u labs  Unresulted Labs (From admission, onward)    None       Total time spent in review of labs and imaging, patient evaluation, formulation of plan, documentation and communication with family: 25 minutes  Signed, Lorin Glass, MD [facility] 01/19/2024

## 2024-01-19 NOTE — Progress Notes (Signed)
 [facility] Progress Note   Subjective:    Seen in room. Sleeping soundly. No new events.   Objective Vitals:   01/18/24 1923 01/18/24 2142 01/19/24 0347 01/19/24 0854  BP: (!) 102/57 (!) 142/50 125/66 (!) 134/58  Pulse: 80 82 77 86  Resp: 20 20 20    Temp: 98.1 F (36.7 C) 97.7 F (36.5 C) 98.4 F (36.9 C)   TempSrc: Oral Oral Oral   SpO2: 100%  100% 95%  Weight:      Height:       Physical Exam General: Sleeping, nad  Heart: RRR Lungs: Clear bilaterally  Abdomen: Soft, non-distended Extremities: L BKA, R BKA in stump protector  Dialysis Access:  LUE AVF + bruit  Filed Weights   01/15/24 1332 01/17/24 0852 01/17/24 1328  Weight: 69.7 kg 64.9 kg 64.3 kg    Intake/Output Summary (Last 24 hours) at 01/19/2024 0909 Last data filed at 01/19/2024 0600 Gross per 24 hour  Intake 620 ml  Output 0 ml  Net 620 ml     Additional Objective Labs: Basic Metabolic Panel: Recent Labs  Lab 01/12/24 1834 01/15/24 0802 01/15/24 1344 01/17/24 0514  NA 133* 133* 133* 134*  K 3.4* 3.0* 3.2* 3.7  CL 91* 92* 93* 94*  CO2 24 25  --  21*  GLUCOSE 118* 108* 186* 112*  BUN 34* 35* 17 44*  CREATININE 7.05* 6.99* 4.10* 6.25*  CALCIUM 8.4* 8.9  --  9.2  PHOS 4.4 5.7*  --  5.3*   Liver Function Tests: Recent Labs  Lab 01/12/24 1834 01/15/24 0802 01/17/24 0514  ALBUMIN 2.2* 2.1* 2.4*   No results for input(s): "LIPASE", "AMYLASE" in the last 168 hours. CBC: Recent Labs  Lab 01/15/24 0500 01/15/24 1344 01/17/24 0514  WBC 11.4*  --  9.1  NEUTROABS 9.5*  --  7.1  HGB 11.8* 14.6 12.8*  HCT 37.3* 43.0 41.2  MCV 88.8  --  88.8  PLT 283  --  249   Blood Culture    Component Value Date/Time   SDES BLOOD RIGHT HAND 01/06/2024 2138   SPECREQUEST  01/06/2024 2138    BOTTLES DRAWN AEROBIC AND ANAEROBIC Blood Culture results may not be optimal due to an inadequate volume of blood received in culture bottles   CULT  01/06/2024 2138    NO GROWTH 5  DAYS Performed at [facility], [address] [place], [place] [address]    REPTSTATUS 01/11/2024 FINAL 01/06/2024 2138    Cardiac Enzymes: No results for input(s): "CKTOTAL", "CKMB", "CKMBINDEX", "TROPONINI" in the last 168 hours. CBG: Recent Labs  Lab 01/18/24 1140 01/18/24 1634 01/18/24 2114 01/18/24 2200 01/19/24 0752  GLUCAP 161* 153* 174* 197* 149*   Iron Studies: No results for input(s): "IRON", "TIBC", "TRANSFERRIN", "FERRITIN" in the last 72 hours. Lab Results  Component Value Date   INR 1.3 (H) 10/19/2023   INR 1.4 (H) 05/20/2023   INR 1.4 (H) 03/21/2023   Studies/Results: No results found.  Medications:  sodium chloride      (feeding supplement) PROSource Plus  30 mL Oral BID BM   apixaban  5 mg Oral BID   vitamin C  1,000 mg Oral Daily   aspirin EC  81 mg Oral Daily   atorvastatin  80 mg Oral Daily   Chlorhexidine Gluconate Cloth  6 each Topical Q0600   doxercalciferol  2 mcg Intravenous Q M,W,F-HD   feeding supplement  237 mL Oral BID BM   finasteride  5 mg Oral Daily   gabapentin  300 mg Oral BID   Gerhardt's butt cream  1 Application Topical BID   insulin aspart  0-5 Units Subcutaneous QHS   insulin aspart  0-9 Units Subcutaneous TID WC   mupirocin ointment   Nasal BID   nutrition supplement (JUVEN)  1 packet Oral BID BM   saccharomyces boulardii  250 mg Oral BID   sevelamer carbonate  1,600 mg Oral TID WC   tamsulosin  0.4 mg Oral QHS   zinc sulfate (50mg  elemental zinc)  220 mg Oral Daily    Dialysis Orders: AF MWF 4:00 425/800 EDW 80.4kg 2K/2Ca AVF No heparin  Post HD wt 3/10 - 75.4 kg  -Mircera 225 q 2 weeks (last 3/3) -Hectorol 2 q HD   Assessment/Plan: Sepsis/R foot wound - 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 ESRD -  HD MWF. Using added K bath. Next HD Monday.  Hypertension/volume  - BP ok. Well below EDW. Plan to lower at discharge. Anemia  - Hgb 12.8 No ESA needs.   Metabolic bone disease -  Calcium acceptable. Phos at goal. Continue  home meds  PAFib- on Eliquis  T2DM - per primary  Diarrhea - receiving Imodium per primary  Nutrition - Renal diet, fluid restriction. Prot supp for low albumin  Dispo - pending rehab plans   Tomasa Blase PA-C [facility] 01/19/2024,9:09 AM

## 2024-01-20 DIAGNOSIS — L089 Local infection of the skin and subcutaneous tissue, unspecified: Secondary | ICD-10-CM | POA: Diagnosis not present

## 2024-01-20 DIAGNOSIS — E11628 Type 2 diabetes mellitus with other skin complications: Secondary | ICD-10-CM | POA: Diagnosis not present

## 2024-01-20 LAB — GLUCOSE, CAPILLARY
Glucose-Capillary: 150 mg/dL — ABNORMAL HIGH (ref 70–99)
Glucose-Capillary: 150 mg/dL — ABNORMAL HIGH (ref 70–99)
Glucose-Capillary: 109 mg/dL — ABNORMAL HIGH (ref 70–99)
Glucose-Capillary: 105 mg/dL — ABNORMAL HIGH (ref 70–99)

## 2024-01-20 MED ORDER — ASCORBIC ACID 1000 MG PO TABS: 1000.0000 mg | ORAL_TABLET | Freq: Every day | ORAL | Status: DC

## 2024-01-20 MED ORDER — OXYCODONE HCL 5 MG PO TABS: 5.0000 mg | ORAL_TABLET | ORAL | 0 refills | Status: AC | PRN

## 2024-01-20 MED ORDER — ZINC SULFATE 220 (50 ZN) MG PO CAPS: 220.0000 mg | ORAL_CAPSULE | Freq: Every day | ORAL | Status: DC

## 2024-01-20 MED ORDER — POLYETHYLENE GLYCOL 3350 17 GM/SCOOP PO POWD
17.0000 g | Freq: Every day | ORAL | Status: DC | PRN
Start: 2024-01-20 — End: 2024-04-30

## 2024-01-20 MED ORDER — ENSURE ENLIVE PO LIQD: 237.0000 mL | Freq: Two times a day (BID) | ORAL | Status: DC

## 2024-01-20 MED ORDER — PROSOURCE PLUS PO LIQD: 30.0000 mL | Freq: Two times a day (BID) | ORAL | Status: DC

## 2024-01-20 MED ORDER — CARVEDILOL 3.125 MG PO TABS: 3.1250 mg | ORAL_TABLET | Freq: Two times a day (BID) | ORAL | Status: DC

## 2024-01-20 MED ORDER — JUVEN PO PACK: 1.0000 | PACK | Freq: Two times a day (BID) | ORAL | Status: DC

## 2024-01-20 NOTE — Progress Notes (Signed)
 Physical Therapy Treatment Patient Details Name: William Webb MRN: [id number] DOB: 1950/11/24 Today's Date: 01/20/2024   History of Present Illness Pt is a 73 y/o male presenting on 3/10 with chronic R foot wound, lethargy. 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  PMH includes: ESRD HD MWF, DM2, HTN, L BKA.    PT Comments  Pt received in bed and somewhat lethargic, he notes that he has felt "foggy" ever since surgery. Pt tolerated amputee exercises in supine and sitting. Needed max A +2 to come to sitting EOB. Pt with strong L lean today in sitting and felt that he was falling to R when brought to midline. Worked on reaching activities and WB'ing on R elbow and sense of midline improved somewhat but continued to need support when sitting EOB. Pt did not want to transfer to chair but worked on lateral scooting along EOB with max A +2. O2 left off during mobility but could not get a pulse ox reading on pt end of session so 2L O2 reapplied. Patient will benefit from continued inpatient follow up therapy, <3 hours/day. PT will continue to follow.     If plan is discharge home, recommend the following: A lot of help with walking and/or transfers;A lot of help with bathing/dressing/bathroom;Assist for transportation;Help with stairs or ramp for entrance   Can travel by private vehicle     No  Equipment Recommendations  None recommended by PT    Recommendations for Other Services       Precautions / Restrictions Precautions Precautions: Fall Recall of Precautions/Restrictions: Intact Precaution/Restrictions Comments: chronic L BKA, R BKA with limb protector, wound vac Required Braces or Orthoses: Other Brace Other Brace: limb protector R LE (wound vac) Restrictions Weight Bearing Restrictions Per Provider Order: Yes RLE Weight Bearing Per Provider Order: Non weight bearing     Mobility  Bed Mobility Overal bed mobility: Needs Assistance Bed Mobility: Sit to  Supine, Supine to Sit     Supine to sit: Used rails, Mod assist, +2 for physical assistance Sit to supine: Mod assist, +2 for physical assistance   General bed mobility comments: needed cues for sequencing, fatigued quickly with all effort. Mod A for elevation of trunk and scooting hips to EOB. Pt with strong L lean in sitting and when brought to midline reported feeling that he would fall to R. Have not seen this noted in past sessions.    Transfers Overall transfer level: Needs assistance                Lateral/Scoot Transfers: Max assist, +2 physical assistance General transfer comment: pt did not want to get in chair so worked on scooting laterally along EOB. Pt able to use UE's to effectively push but still needs max A +2 to move side to side    Ambulation/Gait               General Gait Details: unable   Stairs             Wheelchair Mobility     Tilt Bed    Modified Rankin (Stroke Patients Only)       Balance Overall balance assessment: Needs assistance Sitting-balance support: No upper extremity supported, Bilateral upper extremity supported, Single extremity supported, Feet unsupported Sitting balance-Leahy Scale: Poor Sitting balance - Comments: posterior lean which is not new but today strong L lean. Worked on [employer] through R elbow which did help the insecurity that pt felt but still maintained  L lean when not assisted in midline. Worked on reaching activities in sitting EOB for fwd and R wt shfit Postural control: Posterior lean, Left lateral lean                                  Communication Communication Communication: No apparent difficulties Factors Affecting Communication:  (soft spoken)  Cognition Arousal: Lethargic Behavior During Therapy: Flat affect   PT - Cognitive impairments: Safety/Judgement, Problem solving                       PT - Cognition Comments: slow with processing commands, mod cues and  assist for safety with mobility Following commands: Intact Following commands impaired: Follows one step commands with increased time    Cueing Cueing Techniques: Verbal cues, Tactile cues, Visual cues  Exercises Amputee Exercises Quad Sets: AROM, Both, 10 reps, Supine Gluteal Sets: AROM, Both, 10 reps, Supine Knee Flexion: AROM, Right, 10 reps, Seated Knee Extension: AROM, Right, 10 reps, Seated Straight Leg Raises: AROM, Right, 5 reps, Supine    General Comments General comments (skin integrity, edema, etc.): could not get an O2 reading on pt as hands very cold. O2 removed during session but replaced when could not get a reading      Pertinent Vitals/Pain Pain Assessment Pain Assessment: Faces Faces Pain Scale: Hurts little more Pain Location: RLE, buttocks Pain Descriptors / Indicators: Pressure, Sore Pain Intervention(s): Limited activity within patient's tolerance, Monitored during session    Home Living                          Prior Function            PT Goals (current goals can now be found in the care plan section) Acute Rehab PT Goals Patient Stated Goal: agrees to rehab PT Goal Formulation: With patient Time For Goal Achievement: 01/30/24 Potential to Achieve Goals: Fair Progress towards PT goals: Progressing toward goals (very slowly)    Frequency    Min 1X/week      PT Plan      Co-evaluation              AM-PAC PT "6 Clicks" Mobility   Outcome Measure  Help needed turning from your back to your side while in a flat bed without using bedrails?: A Little Help needed moving from lying on your back to sitting on the side of a flat bed without using bedrails?: Total Help needed moving to and from a bed to a chair (including a wheelchair)?: Total Help needed standing up from a chair using your arms (e.g., wheelchair or bedside chair)?: Total Help needed to walk in hospital room?: Total Help needed climbing 3-5 steps with a railing?  : Total 6 Click Score: 8    End of Session Equipment Utilized During Treatment: Other (comment);Oxygen (R limb protector; wound vac) Activity Tolerance: Patient limited by fatigue Patient left: in bed;with bed alarm set;with call bell/phone within reach Nurse Communication: Mobility status PT Visit Diagnosis: Muscle weakness (generalized) (M62.81);Other abnormalities of gait and mobility (R26.89)     Time: 4098-1191 PT Time Calculation (min) (ACUTE ONLY): 25 min  Charges:    $Therapeutic Exercise: 8-22 mins $Therapeutic Activity: 8-22 mins PT General Charges $$ ACUTE PT VISIT: 1 Visit  Lyanne Co, PT  Acute Rehab Services Secure chat preferred Office [phone]    Elyse Hsu 01/20/2024, 2:12 PM
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
## 2024-01-20 NOTE — Progress Notes (Signed)
 [name] Progress Note   Subjective:    Seen and examined patient at bedside. No acute issues. Plan for HD this afternoon.  Objective Vitals:   01/19/24 1617 01/19/24 2022 01/20/24 0600 01/20/24 0736  BP: (!) 88/52 129/71 (!) 165/80 (!) 154/30  Pulse: 79 91 79 77  Resp:  18 18 18   Temp: 98.1 F (36.7 C) 98 F (36.7 C) (!) 97.5 F (36.4 C) 97.6 F (36.4 C)  TempSrc: Oral Oral  Oral  SpO2: 100% 99% 100% (!) 79%  Weight:      Height:       Physical Exam General: Sleeping, nad  Heart: RRR Lungs: Clear bilaterally  Abdomen: Soft, non-distended Extremities: L BKA, R BKA in stump protector  Dialysis Access:  LUE AVF + bruit  Filed Weights   01/15/24 1332 01/17/24 0852 01/17/24 1328  Weight: 69.7 kg 64.9 kg 64.3 kg    Intake/Output Summary (Last 24 hours) at 01/20/2024 1110 Last data filed at 01/20/2024 0600 Gross per 24 hour  Intake 480 ml  Output 0 ml  Net 480 ml    Additional Objective Labs: Basic Metabolic Panel: Recent Labs  Lab 01/15/24 0802 01/15/24 1344 01/17/24 0514  NA 133* 133* 134*  K 3.0* 3.2* 3.7  CL 92* 93* 94*  CO2 25  --  21*  GLUCOSE 108* 186* 112*  BUN 35* 17 44*  CREATININE 6.99* 4.10* 6.25*  CALCIUM 8.9  --  9.2  PHOS 5.7*  --  5.3*   Liver Function Tests: Recent Labs  Lab 01/15/24 0802 01/17/24 0514  ALBUMIN 2.1* 2.4*   No results for input(s): "LIPASE", "AMYLASE" in the last 168 hours. CBC: Recent Labs  Lab 01/15/24 0500 01/15/24 1344 01/17/24 0514  WBC 11.4*  --  9.1  NEUTROABS 9.5*  --  7.1  HGB 11.8* 14.6 12.8*  HCT 37.3* 43.0 41.2  MCV 88.8  --  88.8  PLT 283  --  249   Blood Culture    Component Value Date/Time   SDES BLOOD RIGHT HAND 01/06/2024 2138   SPECREQUEST  01/06/2024 2138    BOTTLES DRAWN AEROBIC AND ANAEROBIC Blood Culture results may not be optimal due to an inadequate volume of blood received in culture bottles   CULT  01/06/2024 2138    NO GROWTH 5 DAYS Performed at [facility], [address], [place], [place] [address]    REPTSTATUS 01/11/2024 FINAL 01/06/2024 2138    Cardiac Enzymes: No results for input(s): "CKTOTAL", "CKMB", "CKMBINDEX", "TROPONINI" in the last 168 hours. CBG: Recent Labs  Lab 01/19/24 0752 01/19/24 1124 01/19/24 1618 01/19/24 2024 01/20/24 0737  GLUCAP 149* 141* 136* 220* 150*   Iron Studies: No results for input(s): "IRON", "TIBC", "TRANSFERRIN", "FERRITIN" in the last 72 hours. Lab Results  Component Value Date   INR 1.3 (H) 10/19/2023   INR 1.4 (H) 05/20/2023   INR 1.4 (H) 03/21/2023   Studies/Results: No results found.  Medications:  sodium chloride      (feeding supplement) PROSource Plus  30 mL Oral BID BM   apixaban  5 mg Oral BID   vitamin C  1,000 mg Oral Daily   aspirin EC  81 mg Oral Daily   atorvastatin  80 mg Oral Daily   Chlorhexidine Gluconate Cloth  6 each Topical Q0600   doxercalciferol  2 mcg Intravenous Q M,W,F-HD   feeding supplement  237 mL Oral BID BM   feeding supplement  237 mL Oral  BID BM   finasteride  5 mg Oral Daily   gabapentin  300 mg Oral BID   Gerhardt's butt cream  1 Application Topical BID   insulin aspart  0-5 Units Subcutaneous QHS   insulin aspart  0-9 Units Subcutaneous TID WC   nutrition supplement (JUVEN)  1 packet Oral BID BM   saccharomyces boulardii  250 mg Oral BID   sevelamer carbonate  1,600 mg Oral TID WC   tamsulosin  0.4 mg Oral QHS   zinc sulfate (50mg  elemental zinc)  220 mg Oral Daily    Dialysis Orders: AF MWF 4:00 425/800 EDW 80.4kg 2K/2Ca AVF No heparin  Post HD wt 3/10 - 75.4 kg  -Mircera 225 q 2 weeks (last 3/3) -Hectorol 2 q HD  Assessment/Plan: Sepsis/R foot wound - 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 ESRD -  HD MWF. Using added K bath. Next HD this afternoon.  Hypertension/volume  - BP ok. Well below EDW. Plan to lower at discharge. Anemia  - Hgb 12.8 No ESA needs.   Metabolic bone disease -  Calcium acceptable. Phos at goal. Continue home meds   PAFib- on Eliquis  T2DM - per primary  Diarrhea - receiving Imodium per primary  Nutrition - Renal diet, fluid restriction. Prot supp for low albumin  Dispo - pending rehab plans   William Holmes, NP [facility] 01/20/2024,11:10 AM  LOS: 11 days

## 2024-01-20 NOTE — TOC Progression Note (Addendum)
 Transition of Care [facility]) - Progression Note    Patient Details  Name: William Webb MRN: [id number] Date of Birth: May 08, 1951  Transition [facility]) CM/SW Contact  Marliss Coots, LCSW Phone Number: 01/20/2024, 1:37 PM  Clinical Narrative:     1:37 PM Per SNF, insurance authorization was approved and are able to accept patient today for discharge. Medical team made aware. Hospitalist informed CSW that patient is to discharge upon hemodialysis. CSW made SNF aware.  2:00 PM CSW informed patient and nephew, [name], of expected discharge today after dialysis. CSW offered to coordinate ambulance transportation for discharge and informed them of possible copay to be billed upon service. Patient and nephew consented to ambulance transportation for discharge. Patient and nephew expressed concerns of hand discoloration due to lack of oxygen. CSW relayed concerns to medical team. CSW informed patient and nephew of dialysis chair time for later today or tomorrow morning. Patient and nephew expressed understanding of possible discharge to SNF later today or tomorrow.  2:35 PM SNF informed CSW that they are unable to admit after 6:00PM. CSW made medical team aware. Insurance authorization is valid 01/19/2024-01/21/2024.  2:44 PM Per char review, patient has yet to be taken to dialysis. CSW informed SNF and medical team of expected discharge tomorrow once patient receives dialysis.  Expected Discharge Plan: Home w Home Health Services Barriers to Discharge: Continued Medical Work up  Expected Discharge Plan and Services In-house Referral: Clinical Social Work Discharge Planning Services: CM Consult Post Acute Care Choice: Home Health Living arrangements for the past 2 months: Apartment                                       Social Determinants of Health (SDOH) Interventions SDOH Screenings   Food Insecurity: No Food Insecurity (01/07/2024)  Housing: Low Risk  (01/07/2024)   Transportation Needs: No Transportation Needs (01/07/2024)  Utilities: Not At Risk (01/07/2024)  Depression (PHQ2-9): Low Risk  (10/07/2020)  Social Connections: Socially Isolated (01/07/2024)  Tobacco Use: Medium Risk (01/15/2024)    Readmission Risk Interventions    05/22/2023   11:05 AM 03/22/2023    4:41 PM 03/19/2022    3:43 PM  Readmission Risk Prevention Plan  Transportation Screening Complete Complete Complete  Medication Review (RN Care Manager) Referral to Pharmacy Complete Complete  PCP or Specialist appointment within 3-5 days of discharge Complete Complete Complete  HRI or Home Care Consult Complete Complete Complete  SW Recovery Care/Counseling Consult Complete Complete Complete  Palliative Care Screening Not Applicable Not Applicable Not Applicable  Skilled Nursing Facility Not Applicable Not Applicable Not Applicable

## 2024-01-20 NOTE — Progress Notes (Signed)
 PROGRESS NOTE  William Webb [facility]  DOB: 1950-12-16  PCP: Irven Coe, MD ZOX:[id number]  DOA: 01/06/2024  LOS: 11 days  Hospital Day: 15  Brief narrative: William Webb is a 73 y.o. male with PMH significant for ESRD-HD-MWF, DM2, HTN, HLD, CAD, CHF, paroxysmal A-fib, PAD, diabetic neuropathy s/p prior left BKA.  He also has a right foot wound that continue to worsen despite endovascular intervention in January. 3/10, patient presented to the ED with complaint of 2 to 3 days of fever, generalized weakness and increased drainage from chronic right foot wound X-ray of right foot was concerning for osteomyelitis. MRI right foot also suggested osteomyelitis.  Orthopedics was consulted.  Recommended right BKA but family wanted to wait for Dr. Lajoyce Corners. Seen by Dr. Lajoyce Corners on 3/17.  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  Subjective: Patient was seen and examined this morning. Lying on bed.  Not in distress.  On low-flow oxygen. 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 Lajoyce Corners Wound VAC in place.  At discharge, to continue Prevena plus portable wound VAC pump PT eval obtained.  SNF recommended.   ESRD-HD-MWF Nephrology involved.    Antibiotic associated diarrhea No clinical symptoms to suggest C. difficile  continue probiotic and as needed Imodium - improved per patient report   Paroxysmal A-fib Continue Coreg. Chronically anticoagulated with Eliquis.  Resumed after surgery.  Chronic diastolic CHF HTN Volume status stable on dialysis PTA meds- Carvedilol, Imdur.  Currently both on hold because of low blood pressure.   CAD, HLD Continue aspirin, Lipitor  Type 2 diabetes mellitus Hypoglycemia A1c 6.3 on 01/14/2024 PTA meds-Premeal sliding scale Currently blood sugars controlled on on SSI/Accu-Cheks. Recent Labs  Lab 01/19/24 0752 01/19/24 1124 01/19/24 1618  01/19/24 2024 01/20/24 0737  GLUCAP 149* 141* 136* 220* 150*   BPH Continue finasteride and Flomax   Mobility: PT/OT eval obtained.  SNF recommended  Goals of care   Code Status: Full Code     DVT prophylaxis:  SCD's Start: 01/15/24 1648 SCDs Start: 01/07/24 0018 apixaban (ELIQUIS) tablet 5 mg   Antimicrobials: Completed the course of IV antibiotics.   Fluid: None Consultants: Orthopedics, nephrology Family Communication: None at bedside  Status: Inpatient Level of care:  Med-Surg   Patient is from: Home Needs to continue in-hospital care: Medically stable for discharge Anticipated d/c to:  needs placement.  Pending insurance authorization      Diet:  Diet Order             Diet renal with fluid restriction Fluid restriction: 1200 mL Fluid; Room service appropriate? Yes; Fluid consistency: Thin  Diet effective now                   Scheduled Meds:  (feeding supplement) PROSource Plus  30 mL Oral BID BM   apixaban  5 mg Oral BID   vitamin C  1,000 mg Oral Daily   aspirin EC  81 mg Oral Daily   atorvastatin  80 mg Oral Daily   Chlorhexidine Gluconate Cloth  6 each Topical Q0600   doxercalciferol  2 mcg Intravenous Q M,W,F-HD   feeding supplement  237 mL Oral BID BM   feeding supplement  237 mL Oral BID BM   finasteride  5 mg Oral Daily   gabapentin  300 mg Oral BID   Gerhardt's butt cream  1 Application Topical BID   insulin aspart  0-5 Units Subcutaneous QHS   insulin aspart  0-9 Units Subcutaneous TID WC   nutrition supplement (JUVEN)  1 packet Oral BID BM   saccharomyces boulardii  250 mg Oral BID   sevelamer carbonate  1,600 mg Oral TID WC   tamsulosin  0.4 mg Oral QHS   zinc sulfate (50mg  elemental zinc)  220 mg Oral Daily    PRN meds: acetaminophen **OR** [DISCONTINUED] acetaminophen, HYDROmorphone (DILAUDID) injection, linaclotide, loperamide, ondansetron **OR** ondansetron (ZOFRAN) IV, oxyCODONE   Infusions:   sodium chloride       Antimicrobials: Anti-infectives (From admission, onward)    Start     Dose/Rate Route Frequency Ordered Stop   01/15/24 0600  ceFAZolin (ANCEF) IVPB 2g/100 mL premix        2 g 200 mL/hr over 30 Minutes Intravenous On call to O.R. 01/14/24 2130 01/15/24 1510   01/15/24 0600  vancomycin (VANCOCIN) IVPB 1000 mg/200 mL premix  Status:  Discontinued        1,000 mg 200 mL/hr over 60 Minutes Intravenous On call to O.R. 01/14/24 2130 01/15/24 0836   01/10/24 1200  vancomycin (VANCOREADY) IVPB 750 mg/150 mL        750 mg 150 mL/hr over 60 Minutes Intravenous Every M-W-F (Hemodialysis) 01/08/24 1813 01/15/24 1150   01/08/24 1900  vancomycin (VANCOREADY) IVPB 1750 mg/350 mL        1,750 mg 175 mL/hr over 120 Minutes Intravenous  Once 01/08/24 1813 01/09/24 0231   01/07/24 0100  piperacillin-tazobactam (ZOSYN) IVPB 2.25 g        2.25 g 100 mL/hr over 30 Minutes Intravenous Every 8 hours 01/07/24 0031 01/16/24 2359   01/06/24 2330  Ampicillin-Sulbactam (UNASYN) 3 g in sodium chloride 0.9 % 100 mL IVPB  Status:  Discontinued       Placed in "And" Linked Group   3 g 200 mL/hr over 30 Minutes Intravenous  Once 01/06/24 2326 01/07/24 0019   01/06/24 2330  vancomycin (VANCOCIN) IVPB 1000 mg/200 mL premix       Placed in "And" Linked Group   1,000 mg 200 mL/hr over 60 Minutes Intravenous  Once 01/06/24 2326 01/07/24 0053       Objective: Vitals:   01/20/24 0600 01/20/24 0736  BP: (!) 165/80 (!) 154/30  Pulse: 79 77  Resp: 18 18  Temp: (!) 97.5 F (36.4 C) 97.6 F (36.4 C)  SpO2: 100% (!) 79%    Intake/Output Summary (Last 24 hours) at 01/20/2024 1122 Last data filed at 01/20/2024 0600 Gross per 24 hour  Intake 480 ml  Output 0 ml  Net 480 ml   Filed Weights   01/15/24 1332 01/17/24 0852 01/17/24 1328  Weight: 69.7 kg 64.9 kg 64.3 kg   Weight change:  Body mass index is 17.72 kg/m.   Physical Exam: General exam: Pleasant, elderly African-American male Skin: No rashes,  lesions or ulcers. HEENT: Atraumatic, normocephalic, no obvious bleeding Lungs: Clear to auscultation bilaterally,  CVS: S1, S2, no murmur,   GI/Abd: Soft, nontender, nondistended, bowel sound present,   CNS: Alert, awake, oriented x 3 Psychiatry: Sad affect Extremities: No pedal edema, no calf tenderness, left prior BKA status.  New right AKA status.  Data Review: I have personally reviewed the laboratory data and studies available.  F/u labs  Unresulted Labs (From admission, onward)    None       Signed, Lorin Glass, MD [facility] 01/20/2024

## 2024-01-21 ENCOUNTER — Ambulatory Visit: Payer: Medicare Other

## 2024-01-21 ENCOUNTER — Inpatient Hospital Stay (HOSPITAL_COMMUNITY): Payer: Medicare Other

## 2024-01-21 DIAGNOSIS — E11628 Type 2 diabetes mellitus with other skin complications: Secondary | ICD-10-CM | POA: Diagnosis not present

## 2024-01-21 DIAGNOSIS — L089 Local infection of the skin and subcutaneous tissue, unspecified: Secondary | ICD-10-CM | POA: Diagnosis not present

## 2024-01-21 DIAGNOSIS — T82838A Hemorrhage of vascular prosthetic devices, implants and grafts, initial encounter: Secondary | ICD-10-CM | POA: Diagnosis not present

## 2024-01-21 DIAGNOSIS — Z992 Dependence on renal dialysis: Secondary | ICD-10-CM | POA: Diagnosis not present

## 2024-01-21 DIAGNOSIS — N186 End stage renal disease: Secondary | ICD-10-CM | POA: Diagnosis not present

## 2024-01-21 LAB — GLUCOSE, CAPILLARY
Glucose-Capillary: 110 mg/dL — ABNORMAL HIGH (ref 70–99)
Glucose-Capillary: 122 mg/dL — ABNORMAL HIGH (ref 70–99)
Glucose-Capillary: 163 mg/dL — ABNORMAL HIGH (ref 70–99)
Glucose-Capillary: 226 mg/dL — ABNORMAL HIGH (ref 70–99)

## 2024-01-21 MED ORDER — APIXABAN 5 MG PO TABS: 5.0000 mg | ORAL_TABLET | Freq: Two times a day (BID) | ORAL | Status: DC

## 2024-01-21 NOTE — TOC Progression Note (Signed)
 Transition of Care [facility]) - Progression Note    Patient Details  Name: William Webb MRN: [id number] Date of Birth: 1950/11/16  Transition of Care [facility]) CM/SW Contact  Erin Sons, [place] Phone Number: 01/21/2024, 12:37 PM  Clinical Narrative:    Pt not medically ready. Insurance auth listed fro 3/23-3/25. CSW called [facility] and confirmed pt's Berkley Harvey has a grace period. Pt has until 1159pm on 01/22/24. CSW updated [facility].    Expected Discharge Plan: Skilled Nursing Facility Barriers to Discharge: Continued Medical Work up  Expected Discharge Plan and Services In-house Referral: Clinical Social Work Discharge Planning Services: CM Consult Post Acute Care Choice: Skilled Nursing Facility Living arrangements for the past 2 months: Apartment                                       Social Determinants of Health (SDOH) Interventions SDOH Screenings   Food Insecurity: No Food Insecurity (01/07/2024)  Housing: Low Risk  (01/07/2024)  Transportation Needs: No Transportation Needs (01/07/2024)  Utilities: Not At Risk (01/07/2024)  Depression (PHQ2-9): Low Risk  (10/07/2020)  Social Connections: Socially Isolated (01/07/2024)  Tobacco Use: Medium Risk (01/15/2024)    Readmission Risk Interventions    05/22/2023   11:05 AM 03/22/2023    4:41 PM 03/19/2022    3:43 PM  Readmission Risk Prevention Plan  Transportation Screening Complete Complete Complete  Medication Review (RN Care Manager) Referral to Pharmacy Complete Complete  PCP or Specialist appointment within 3-5 days of discharge Complete Complete Complete  HRI or Home Care Consult Complete Complete Complete  SW Recovery Care/Counseling Consult Complete Complete Complete  Palliative Care Screening Not Applicable Not Applicable Not Applicable  Skilled Nursing Facility Not Applicable Not Applicable Not Applicable

## 2024-01-21 NOTE — Progress Notes (Signed)
 PROGRESS NOTE  Josealberto [facility]  DOB: 12/04/1950  PCP: Irven Coe, MD XBM:[id number]  DOA: 01/06/2024  LOS: 12 days  Hospital Day: 16  Brief narrative: Symir Mah is a 73 y.o. male with PMH significant for ESRD-HD-MWF, DM2, HTN, HLD, CAD, CHF, paroxysmal A-fib, PAD, diabetic neuropathy s/p prior left BKA.  He also has a right foot wound that continue to worsen despite endovascular intervention in January. 3/10, patient presented to the ED with complaint of 2 to 3 days of fever, generalized weakness and increased drainage from chronic right foot wound X-ray of right foot was concerning for osteomyelitis. MRI right foot also suggested osteomyelitis.  Orthopedics was consulted.  Recommended right BKA but family wanted to wait for Dr. Lajoyce Corners. Seen by Dr. Lajoyce Corners on 3/17.  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 3/24, was planned for discharge but now delayed because he needs a fistulogram, planned for 3/26  Subjective: Patient was seen and examined this morning at dialysis. Patient was planned for discharge yesterday but could not be discharged because he could not have scheduled dialysis yesterday. Underwent dialysis this morning. Per dialysis team, he had prolonged bleeding at the end of dialysis.  Vascular surgery was consulted for fistulogram. Discharge canceled today  Assessment and plan: Right diabetic foot infection - right heel ulceration S/p right BKA -3/19 Dr. Lajoyce Corners Wound VAC in place.  At discharge, to continue Prevena plus portable wound VAC pump PT eval obtained.  SNF recommended.   ESRD-HD-MWF Nephrology involved.   Prolonged bleeding through fistula Per dialysis team, this morning at the end of dialysis, he had prolonged bleeding.  Vascular surgery was consulted for fistulogram, planned for tomorrow. Hemoglobin stable as below Recent Labs    03/22/23 0418 03/22/23 1112 05/21/23 0804 05/21/23 1734 01/09/24 0427 01/10/24 0852 01/15/24 0500 01/15/24 1344  01/17/24 0514  HGB 8.4*   < > 9.7*   < > 10.2* 10.2* 11.8* 14.6 12.8*  MCV 94.8   < > 93.7   < > 91.3 90.9 88.8  --  88.8  VITAMINB12 853  --  2,739*  --   --   --   --   --   --   FOLATE 13.3  --  17.0  --   --   --   --   --   --   FERRITIN 931*  --  2,383*  --   --   --   --   --   --   TIBC 174*  --  NOT CALCULATED  --   --   --   --   --   --   IRON 44*  --  24*  --   --   --   --   --   --   RETICCTPCT 2.4  --  0.5  --   --   --   --   --   --    < > = values in this interval not displayed.   Antibiotic associated diarrhea No clinical symptoms to suggest C. difficile  continue probiotic and as needed Imodium - improved per patient report   Paroxysmal A-fib Continue Coreg. Chronically anticoagulated with Eliquis.  It was resumed after surgery.  Chronic diastolic CHF HTN Volume status stable on dialysis PTA meds- Carvedilol, Imdur.  Currently both on hold because of low blood pressure.   CAD, HLD Continue aspirin, Lipitor  Type 2 diabetes mellitus Hypoglycemia A1c 6.3 on 01/14/2024 PTA meds-Premeal sliding scale Currently blood sugars  controlled on on SSI/Accu-Cheks. Recent Labs  Lab 01/20/24 0737 01/20/24 1141 01/20/24 1712 01/20/24 2010 01/21/24 0625  GLUCAP 150* 150* 109* 105* 110*   BPH Continue finasteride and Flomax   Mobility: PT/OT eval obtained.  SNF recommended  Goals of care   Code Status: Full Code     DVT prophylaxis:  SCD's Start: 01/15/24 1648 SCDs Start: 01/07/24 0018 apixaban (ELIQUIS) tablet 5 mg   Antimicrobials: Completed the course of IV antibiotics.   Fluid: None Consultants: Orthopedics, nephrology, vascular surgery Family Communication: None at bedside  Status: Inpatient Level of care:  Med-Surg   Patient is from: Home Needs to continue in-hospital care: Pending fistulogram tomorrow 3/26. Anticipated d/c to: SNF in 1 to 2 days      Diet:  Diet Order             Diet NPO time specified  Diet effective midnight            Diet general           Diet renal with fluid restriction Fluid restriction: 1200 mL Fluid; Room service appropriate? Yes; Fluid consistency: Thin  Diet effective now                   Scheduled Meds:  (feeding supplement) PROSource Plus  30 mL Oral BID BM   apixaban  5 mg Oral BID   vitamin C  1,000 mg Oral Daily   aspirin EC  81 mg Oral Daily   atorvastatin  80 mg Oral Daily   Chlorhexidine Gluconate Cloth  6 each Topical Q0600   doxercalciferol  2 mcg Intravenous Q M,W,F-HD   feeding supplement  237 mL Oral BID BM   feeding supplement  237 mL Oral BID BM   finasteride  5 mg Oral Daily   gabapentin  300 mg Oral BID   Gerhardt's butt cream  1 Application Topical BID   insulin aspart  0-5 Units Subcutaneous QHS   insulin aspart  0-9 Units Subcutaneous TID WC   nutrition supplement (JUVEN)  1 packet Oral BID BM   saccharomyces boulardii  250 mg Oral BID   sevelamer carbonate  1,600 mg Oral TID WC   tamsulosin  0.4 mg Oral QHS   zinc sulfate (50mg  elemental zinc)  220 mg Oral Daily    PRN meds: acetaminophen **OR** [DISCONTINUED] acetaminophen, HYDROmorphone (DILAUDID) injection, linaclotide, loperamide, ondansetron **OR** ondansetron (ZOFRAN) IV, oxyCODONE   Infusions:   sodium chloride      Antimicrobials: Anti-infectives (From admission, onward)    Start     Dose/Rate Route Frequency Ordered Stop   01/15/24 0600  ceFAZolin (ANCEF) IVPB 2g/100 mL premix        2 g 200 mL/hr over 30 Minutes Intravenous On call to O.R. 01/14/24 2130 01/15/24 1510   01/15/24 0600  vancomycin (VANCOCIN) IVPB 1000 mg/200 mL premix  Status:  Discontinued        1,000 mg 200 mL/hr over 60 Minutes Intravenous On call to O.R. 01/14/24 2130 01/15/24 0836   01/10/24 1200  vancomycin (VANCOREADY) IVPB 750 mg/150 mL        750 mg 150 mL/hr over 60 Minutes Intravenous Every M-W-F (Hemodialysis) 01/08/24 1813 01/15/24 1150   01/08/24 1900  vancomycin (VANCOREADY) IVPB 1750 mg/350 mL         1,750 mg 175 mL/hr over 120 Minutes Intravenous  Once 01/08/24 1813 01/09/24 0231   01/07/24 0100  piperacillin-tazobactam (ZOSYN) IVPB 2.25 g  2.25 g 100 mL/hr over 30 Minutes Intravenous Every 8 hours 01/07/24 0031 01/16/24 2359   01/06/24 2330  Ampicillin-Sulbactam (UNASYN) 3 g in sodium chloride 0.9 % 100 mL IVPB  Status:  Discontinued       Placed in "And" Linked Group   3 g 200 mL/hr over 30 Minutes Intravenous  Once 01/06/24 2326 01/07/24 0019   01/06/24 2330  vancomycin (VANCOCIN) IVPB 1000 mg/200 mL premix       Placed in "And" Linked Group   1,000 mg 200 mL/hr over 60 Minutes Intravenous  Once 01/06/24 2326 01/07/24 0053       Objective: Vitals:   01/21/24 0730 01/21/24 0735  BP: (!) 121/59   Pulse: 80   Resp: 13 12  Temp: 97.8 F (36.6 C)   SpO2:      Intake/Output Summary (Last 24 hours) at 01/21/2024 0943 Last data filed at 01/21/2024 0730 Gross per 24 hour  Intake --  Output 400 ml  Net -400 ml   Filed Weights   01/15/24 1332 01/17/24 0852 01/17/24 1328  Weight: 69.7 kg 64.9 kg 64.3 kg   Weight change:  Body mass index is 17.72 kg/m.   Physical Exam: General exam: Pleasant, elderly African-American male Skin: No rashes, lesions or ulcers. HEENT: Atraumatic, normocephalic, no obvious bleeding Lungs: Clear to auscultation bilaterally,  CVS: S1, S2, no murmur,   GI/Abd: Soft, nontender, nondistended, bowel sound present,   CNS: Alert, awake, oriented x 3 Psychiatry: Mood appropriate Extremities: No pedal edema, no calf tenderness, left prior BKA status.  New right AKA status.  Data Review: I have personally reviewed the laboratory data and studies available.  F/u labs  Unresulted Labs (From admission, onward)     Start     Ordered   Unscheduled  Basic metabolic panel  Tomorrow morning,   R        01/21/24 0943   Unscheduled  CBC with Differential/Platelet  Tomorrow morning,   R        01/21/24 0943            Signed, Lorin Glass, MD [facility] 01/21/2024

## 2024-01-21 NOTE — Plan of Care (Signed)
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
## 2024-01-21 NOTE — Procedures (Signed)
 I was present at this dialysis session. I have reviewed the session itself and made appropriate changes.  He had prolonged bleeding at the end of dialysis.  Will consult IR for fistulogram.  Vital signs in last 24 hours:  Temp:  [97 F (36.1 C)-97.7 F (36.5 C)] 97 F (36.1 C) (03/25 0709) Pulse Rate:  [15-88] 80 (03/25 0709) Resp:  [12-19] 12 (03/25 0735) BP: (70-162)/(45-139) 109/62 (03/25 0709) SpO2:  [93 %-100 %] 93 % (03/25 0703) Weight change:  Filed Weights   01/15/24 1332 01/17/24 0852 01/17/24 1328  Weight: 69.7 kg 64.9 kg 64.3 kg    Recent Labs  Lab 01/17/24 0514  NA 134*  K 3.7  CL 94*  CO2 21*  GLUCOSE 112*  BUN 44*  CREATININE 6.25*  CALCIUM 9.2  PHOS 5.3*    Recent Labs  Lab 01/15/24 0500 01/15/24 1344 01/17/24 0514  WBC 11.4*  --  9.1  NEUTROABS 9.5*  --  7.1  HGB 11.8* 14.6 12.8*  HCT 37.3* 43.0 41.2  MCV 88.8  --  88.8  PLT 283  --  249    Scheduled Meds:  (feeding supplement) PROSource Plus  30 mL Oral BID BM   apixaban  5 mg Oral BID   vitamin C  1,000 mg Oral Daily   aspirin EC  81 mg Oral Daily   atorvastatin  80 mg Oral Daily   Chlorhexidine Gluconate Cloth  6 each Topical Q0600   doxercalciferol  2 mcg Intravenous Q M,W,F-HD   feeding supplement  237 mL Oral BID BM   feeding supplement  237 mL Oral BID BM   finasteride  5 mg Oral Daily   gabapentin  300 mg Oral BID   Gerhardt's butt cream  1 Application Topical BID   insulin aspart  0-5 Units Subcutaneous QHS   insulin aspart  0-9 Units Subcutaneous TID WC   nutrition supplement (JUVEN)  1 packet Oral BID BM   saccharomyces boulardii  250 mg Oral BID   sevelamer carbonate  1,600 mg Oral TID WC   tamsulosin  0.4 mg Oral QHS   zinc sulfate (50mg  elemental zinc)  220 mg Oral Daily   Continuous Infusions:  sodium chloride     PRN Meds:.acetaminophen **OR** [DISCONTINUED] acetaminophen, HYDROmorphone (DILAUDID) injection, linaclotide, loperamide, ondansetron **OR** ondansetron  (ZOFRAN) IV, oxyCODONE   Irena Cords,  MD 01/21/2024, 7:55 AM

## 2024-01-21 NOTE — Progress Notes (Signed)
 Completed 3 hours of dialysis.  Treatment was complicated with low blood pressure. Patient was alert, NS bolus was given to support BP. Able to removed fluid net of 400 ml.  Needles de accessed, prolong bleeding noted.  MD present and seen by the patient. Plan for IR consult for fistulagram. Patient is alert and oriented. No signs of distress noted. Post tx VS stable. Handoff given to patient primary nurse Sue Lush.

## 2024-01-21 NOTE — Progress Notes (Signed)
 OT Cancellation Note  Patient Details Name: Nayan Proch [facility] MRN: [id number] DOB: Aug 23, 1951   Cancelled Treatment:    Reason Eval/Treat Not Completed: Patient at procedure or test/ unavailable (HD. Will attempt later time)  [facility] 01/21/2024, 6:31 AM Luisa Dago, OT/L   Acute OT Clinical Specialist Acute Rehabilitation Services Pager [phone] Office [phone]

## 2024-01-22 ENCOUNTER — Encounter (HOSPITAL_COMMUNITY)
Admission: EM | Disposition: A | Discharge status: Skilled Nursing Facility | Payer: Self-pay | Source: Home / Self Care | Attending: Internal Medicine

## 2024-01-22 DIAGNOSIS — E11628 Type 2 diabetes mellitus with other skin complications: Secondary | ICD-10-CM | POA: Diagnosis not present

## 2024-01-22 DIAGNOSIS — N186 End stage renal disease: Secondary | ICD-10-CM | POA: Diagnosis not present

## 2024-01-22 DIAGNOSIS — T82838A Hemorrhage of vascular prosthetic devices, implants and grafts, initial encounter: Secondary | ICD-10-CM | POA: Diagnosis not present

## 2024-01-22 DIAGNOSIS — Z992 Dependence on renal dialysis: Secondary | ICD-10-CM

## 2024-01-22 DIAGNOSIS — L089 Local infection of the skin and subcutaneous tissue, unspecified: Secondary | ICD-10-CM | POA: Diagnosis not present

## 2024-01-22 HISTORY — PX: UPPER EXTREMITY INTERVENTION: CATH118271

## 2024-01-22 HISTORY — PX: A/V FISTULAGRAM: CATH118298

## 2024-01-22 LAB — CBC WITH DIFFERENTIAL/PLATELET
Abs Immature Granulocytes: 0.02 10*3/uL (ref 0.00–0.07)
Basophils Absolute: 0 10*3/uL (ref 0.0–0.1)
Basophils Relative: 1 %
Eosinophils Absolute: 0.3 10*3/uL (ref 0.0–0.5)
Eosinophils Relative: 4 %
HCT: 37.7 % — ABNORMAL LOW (ref 39.0–52.0)
Hemoglobin: 11.9 g/dL — ABNORMAL LOW (ref 13.0–17.0)
Immature Granulocytes: 0 %
Lymphocytes Relative: 9 %
Lymphs Abs: 0.7 10*3/uL (ref 0.7–4.0)
MCH: 27.2 pg (ref 26.0–34.0)
MCHC: 31.6 g/dL (ref 30.0–36.0)
MCV: 86.1 fL (ref 80.0–100.0)
Monocytes Absolute: 0.7 10*3/uL (ref 0.1–1.0)
Monocytes Relative: 9 %
Neutro Abs: 5.9 10*3/uL (ref 1.7–7.7)
Neutrophils Relative %: 77 %
Platelets: 217 10*3/uL (ref 150–400)
RBC: 4.38 MIL/uL (ref 4.22–5.81)
RDW: 14.9 % (ref 11.5–15.5)
WBC: 7.5 10*3/uL (ref 4.0–10.5)
nRBC: 0 % (ref 0.0–0.2)

## 2024-01-22 LAB — GLUCOSE, CAPILLARY
Glucose-Capillary: 156 mg/dL — ABNORMAL HIGH (ref 70–99)
Glucose-Capillary: 133 mg/dL — ABNORMAL HIGH (ref 70–99)
Glucose-Capillary: 238 mg/dL — ABNORMAL HIGH (ref 70–99)
Glucose-Capillary: 162 mg/dL — ABNORMAL HIGH (ref 70–99)

## 2024-01-22 LAB — BASIC METABOLIC PANEL
Anion gap: 17 — ABNORMAL HIGH (ref 5–15)
BUN: 52 mg/dL — ABNORMAL HIGH (ref 8–23)
CO2: 25 mmol/L (ref 22–32)
Calcium: 9.3 mg/dL (ref 8.9–10.3)
Chloride: 92 mmol/L — ABNORMAL LOW (ref 98–111)
Creatinine, Ser: 6.18 mg/dL — ABNORMAL HIGH (ref 0.61–1.24)
GFR, Estimated: 9 mL/min — ABNORMAL LOW (ref >60)
Glucose, Bld: 152 mg/dL — ABNORMAL HIGH (ref 70–99)
Potassium: 3.9 mmol/L (ref 3.5–5.1)
Sodium: 134 mmol/L — ABNORMAL LOW (ref 135–145)

## 2024-01-22 SURGERY — A/V FISTULAGRAM
Anesthesia: LOCAL

## 2024-01-22 MED ORDER — LIDOCAINE HCL (PF) 1 % IJ SOLN
INTRAMUSCULAR | Status: AC
Start: 2024-01-22 — End: ?
  Filled 2024-01-22: qty 30

## 2024-01-22 MED ORDER — HEPARIN SODIUM (PORCINE) 1000 UNIT/ML IJ SOLN
INTRAMUSCULAR | Status: AC
Start: 2024-01-22 — End: ?
  Filled 2024-01-22: qty 10

## 2024-01-22 MED ORDER — MEDIHONEY WOUND/BURN DRESSING EX PSTE
1.0000 | PASTE | Freq: Every day | CUTANEOUS | Status: DC
  Administered 2024-01-22 – 2024-01-23 (×2): 1 via TOPICAL
  Filled 2024-01-22: qty 44

## 2024-01-22 MED ORDER — LIDOCAINE HCL (PF) 1 % IJ SOLN
INTRAMUSCULAR | Status: DC | PRN
  Administered 2024-01-22: 2 mL

## 2024-01-22 MED ORDER — HEPARIN SODIUM (PORCINE) 1000 UNIT/ML IJ SOLN
INTRAMUSCULAR | Status: DC | PRN
  Administered 2024-01-22: 2000 [IU] via INTRAVENOUS

## 2024-01-22 MED ORDER — IODIXANOL 320 MG/ML IV SOLN
INTRAVENOUS | Status: DC | PRN
  Administered 2024-01-22: 35 mL

## 2024-01-22 MED ORDER — CHLORHEXIDINE GLUCONATE CLOTH 2 % EX PADS
6.0000 | MEDICATED_PAD | Freq: Every day | CUTANEOUS | Status: DC
  Administered 2024-01-23: 6 via TOPICAL

## 2024-01-22 MED ORDER — HEPARIN (PORCINE) IN NACL 1000-0.9 UT/500ML-% IV SOLN
INTRAVENOUS | Status: DC | PRN
  Administered 2024-01-22: 500 mL

## 2024-01-22 SURGICAL SUPPLY — 16 items
BAG SNAP BAND KOVER 36X36 (MISCELLANEOUS) ×2 IMPLANT
BALLN LUTONIX AV 8X60X75 (BALLOONS) ×2 IMPLANT
BALLN MUSTANG 8.0X40 75 (BALLOONS) ×2 IMPLANT
BALLOON LUTONIX AV 8X60X75 (BALLOONS) IMPLANT
BALLOON MUSTANG 8.0X40 75 (BALLOONS) IMPLANT
COVER DOME SNAP 22 D (MISCELLANEOUS) ×2 IMPLANT
KIT ENCORE 26 ADVANTAGE (KITS) IMPLANT
KIT MICROPUNCTURE NIT STIFF (SHEATH) IMPLANT
PROTECTION STATION PRESSURIZED (MISCELLANEOUS) ×2 IMPLANT
SHEATH PINNACLE R/O II 6F 4CM (SHEATH) IMPLANT
SHEATH PROBE COVER 6X72 (BAG) ×2 IMPLANT
STATION PROTECTION PRESSURIZED (MISCELLANEOUS) ×2 IMPLANT
STOPCOCK MORSE 400PSI 3WAY (MISCELLANEOUS) ×2 IMPLANT
TRAY PV CATH (CUSTOM PROCEDURE TRAY) ×2 IMPLANT
TUBING CIL FLEX 10 FLL-RA (TUBING) ×2 IMPLANT
WIRE BENTSON .035X145CM (WIRE) IMPLANT

## 2024-01-22 NOTE — Consult Note (Signed)
 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  Apply Gerhardt's Butt Cream to surrounding skin as previously prescribed.    POC discussed with bedside nurse. Appreciate Carie Caddy, RN for her assistance with this consult. WOC team will follow every 7 to 10 days to assess area and change POC as needed.   Thank you,    Priscella Mann MSN, RN-BC, [employer] [phone]

## 2024-01-22 NOTE — Op Note (Signed)
    Patient name: William Webb MRN: [id number] DOB: 1951/03/09 Sex: male  01/22/2024 Pre-operative Diagnosis: Left arm fistula malfunction Post-operative diagnosis:  Same Surgeon:  Victorino Sparrow, MD Procedure Performed: 1.  Left arm fistulogram 2.  Drug-coated balloon angioplasty left basilic vein 8 x 40 mm   Indications: Patient is a 73 year old male with end-stage renal disease currently dialyzed through a left sided AV fistula.  This fistula has had prolonged bleeding after dialysis which led to vascular surgery consultation.  After discussing the risks and benefits of left upper extremity fistulogram in an effort to define and improve flow to prevent bleeding episodes, Janes elected to proceed.  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 Otherwise the tissue is widely patent.  Widely patent anastomosis.   Procedure:  The patient was identified in the holding area and taken to room 8.  The patient was then placed supine on the table and prepped and draped in the usual sterile fashion.  A time out was called.  The left arm brachiobasilic fistula was accessed using a micropuncture needle.  This was upsized to a micropuncture sheath, and fistulogram followed.  See results above.  I elected to attempt intervention on the greater than 70% stenosis appreciated in the basilic vein, at the brachial vein, basilic vein confluence. The patient was heparinized and an 8 x 40 mm drug-coated balloon was brought onto the field.  This was inflated for 3 minutes.  Follow-up angiography demonstrated some residual stenosis, therefore the balloon was reinflated slightly more centrally, and then bottle flossed at the confluence.  Follow-up venography demonstrated excellent result with resolution of flowing stenosis.   Impression: Successful drug-coated balloon venoplasty of the basilic vein at the confluence of the basilic vein and  brachial vein forming the axillary vein.  Access can continue to be used at dialysis.     Victorino Sparrow MD [facility] [place] Office: [phone]

## 2024-01-22 NOTE — Progress Notes (Signed)
 [facility] Progress Note   Subjective:    Noted issues with HD early yesterday morning. Patient with excessive bleeding from access causing minimal UF and hypotension. Noted net UF only . S/p F'gram with successful angioplasty today by Dr. Karin Lieu. Next HD 3/27 1st shift.  Objective Vitals:   01/22/24 1117 01/22/24 1122 01/22/24 1127 01/22/24 1132  BP: (!) 109/57  137/68 137/68  Pulse: 75 78 77 80  Resp: (!) 27 16 (!) 21 17  Temp:      TempSrc:      SpO2:  100% 100%   Weight:      Height:       Physical Exam General: Awake, alert, frail-appearing; NAD  Heart: RRR Lungs: Clear bilaterally  Abdomen: Soft, non-distended Extremities: L BKA, R BKA in stump protector  Dialysis Access:  LUE AVF + bruit   Filed Weights   01/15/24 1332 01/17/24 0852 01/17/24 1328  Weight: 69.7 kg 64.9 kg 64.3 kg    Intake/Output Summary (Last 24 hours) at 01/22/2024 1526 Last data filed at 01/22/2024 1300 Gross per 24 hour  Intake 240 ml  Output 0 ml  Net 240 ml    Additional Objective Labs: Basic Metabolic Panel: Recent Labs  Lab 01/17/24 0514 01/22/24 0955  NA 134* 134*  K 3.7 3.9  CL 94* 92*  CO2 21* 25  GLUCOSE 112* 152*  BUN 44* 52*  CREATININE 6.25* 6.18*  CALCIUM 9.2 9.3  PHOS 5.3*  --    Liver Function Tests: Recent Labs  Lab 01/17/24 0514  ALBUMIN 2.4*   No results for input(s): "LIPASE", "AMYLASE" in the last 168 hours. CBC: Recent Labs  Lab 01/17/24 0514 01/22/24 0955  WBC 9.1 7.5  NEUTROABS 7.1 5.9  HGB 12.8* 11.9*  HCT 41.2 37.7*  MCV 88.8 86.1  PLT 249 217   Blood Culture    Component Value Date/Time   SDES BLOOD RIGHT HAND 01/06/2024 2138   SPECREQUEST  01/06/2024 2138    BOTTLES DRAWN AEROBIC AND ANAEROBIC Blood Culture results may not be optimal due to an inadequate volume of blood received in culture bottles   CULT  01/06/2024 2138    NO GROWTH 5 DAYS Performed at [facility], [address], [place], [place]  [address]    REPTSTATUS 01/11/2024 FINAL 01/06/2024 2138    Cardiac Enzymes: No results for input(s): "CKTOTAL", "CKMB", "CKMBINDEX", "TROPONINI" in the last 168 hours. CBG: Recent Labs  Lab 01/21/24 1228 01/21/24 1610 01/21/24 2025 01/22/24 0740 01/22/24 1203  GLUCAP 122* 163* 226* 156* 133*   Iron Studies: No results for input(s): "IRON", "TIBC", "TRANSFERRIN", "FERRITIN" in the last 72 hours. Lab Results  Component Value Date   INR 1.3 (H) 10/19/2023   INR 1.4 (H) 05/20/2023   INR 1.4 (H) 03/21/2023   Studies/Results: PERIPHERAL VASCULAR CATHETERIZATION Result Date: 01/22/2024 Images from the original result were not included. Patient name: William Webb MRN: [id number] DOB: [birth year] Sex: male 01/22/2024 Pre-operative Diagnosis: Left arm fistula malfunction Post-operative diagnosis:  Same Surgeon:  Victorino Sparrow, MD Procedure Performed: 1.  Left arm fistulogram 2.  Drug-coated balloon angioplasty left basilic vein 8 x 40 mm Indications: Patient is a 73 year old male with end-stage renal disease currently dialyzed through a left sided AV fistula.  This fistula has had prolonged bleeding after dialysis which led to vascular surgery consultation.  After discussing the risks and benefits of left upper extremity fistulogram in an effort to define and improve flow to prevent  bleeding episodes, Muad elected to proceed. 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 Otherwise the tissue is widely patent.  Widely patent anastomosis.  Procedure:  The patient was identified in the holding area and taken to room 8.  The patient was then placed supine on the table and prepped and draped in the usual sterile fashion.  A time out was called.  The left arm brachiobasilic fistula was accessed using a micropuncture needle.  This was upsized to a micropuncture sheath, and fistulogram followed.  See results above. I elected to attempt  intervention on the greater than 70% stenosis appreciated in the basilic vein, at the brachial vein, basilic vein confluence. The patient was heparinized and an 8 x 40 mm drug-coated balloon was brought onto the field.  This was inflated for 3 minutes.  Follow-up angiography demonstrated some residual stenosis, therefore the balloon was reinflated slightly more centrally, and then bottle flossed at the confluence.  Follow-up venography demonstrated excellent result with resolution of flowing stenosis. Impression: Successful drug-coated balloon venoplasty of the basilic vein at the confluence of the basilic vein and brachial vein forming the axillary vein.  Access can continue to be used at dialysis. Victorino Sparrow MD [facility] [place] Office: [phone]   Medications:  sodium chloride      (feeding supplement) PROSource Plus  30 mL Oral BID BM   vitamin C  1,000 mg Oral Daily   aspirin EC  81 mg Oral Daily   atorvastatin  80 mg Oral Daily   Chlorhexidine Gluconate Cloth  6 each Topical Q0600   doxercalciferol  2 mcg Intravenous Q M,W,F-HD   feeding supplement  237 mL Oral BID BM   feeding supplement  237 mL Oral BID BM   finasteride  5 mg Oral Daily   gabapentin  300 mg Oral BID   Gerhardt's butt cream  1 Application Topical BID   insulin aspart  0-5 Units Subcutaneous QHS   insulin aspart  0-9 Units Subcutaneous TID WC   leptospermum manuka honey  1 Application Topical Daily   nutrition supplement (JUVEN)  1 packet Oral BID BM   saccharomyces boulardii  250 mg Oral BID   sevelamer carbonate  1,600 mg Oral TID WC   tamsulosin  0.4 mg Oral QHS   zinc sulfate (50mg  elemental zinc)  220 mg Oral Daily    Dialysis Orders: AF MWF 4:00 425/800 EDW 80.4kg 2K/2Ca AVF No heparin  Post HD wt 3/10 - 75.4 kg  -Mircera 225 q 2 weeks (last 3/3) -Hectorol 2 q HD  Assessment/Plan: Sepsis/R foot wound - 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 Vascular access - patient had excessive  bleeding at last HD causing minimal UF. S/p F'gram with successful angioplasty today by Dr. Karin Lieu. Per VVS, AVF can continue to be used with next HD. ESRD -  HD MWF. Using added K bath. Next HD 3/27 1st shift.  Hypertension/volume  - Bps okay but noted Bps dropping with last HD. Monitor trend. Well below EDW. Plan to lower at discharge. Anemia  - Hgb 11.9. No ESA needs.   Metabolic bone disease -  Calcium acceptable. Phos at goal. Continue home meds  PAFib- on Eliquis  T2DM - per primary  Diarrhea - receiving Imodium per primary  Nutrition - Renal diet, fluid restriction. Prot supp for low albumin  Dispo - pending rehab plans   Salome Holmes, NP [employer]  01/22/2024,3:26 PM  LOS: 13 days

## 2024-01-22 NOTE — Progress Notes (Signed)
 PROGRESS NOTE  William Webb [facility]  DOB: 10-Aug-1951  PCP: Irven Coe, MD ZOX:[id number]  DOA: 01/06/2024  LOS: 13 days  Hospital Day: 17  Brief narrative: William Webb is a 73 y.o. male with PMH significant for ESRD-HD-MWF, DM2, HTN, HLD, CAD, CHF, paroxysmal A-fib, PAD, diabetic neuropathy s/p prior left BKA.  He also has a right foot wound that continue to worsen despite endovascular intervention in January. 3/10, patient presented to the ED with complaint of 2 to 3 days of fever, generalized weakness and increased drainage from chronic right foot wound X-ray of right foot was concerning for osteomyelitis. MRI right foot also suggested osteomyelitis. Orthopedics was consulted. Seen by Dr. Lajoyce Corners on 3/17.  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  S/p fistulogram with successful angioplasty by vascular on 3/26.  Subjective: Denies any new complaints, saw patient prior to fistulogram procedure.  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 Lajoyce Corners Wound VAC in place.  At discharge, to continue Prevena plus portable wound VAC pump PT eval obtained.  SNF recommended.   ESRD-HD-MWF Nephrology involved.   Prolonged bleeding through fistula S/p fistulogram with successful angioplasty by vascular on 3/26.  Antibiotic associated diarrhea No clinical symptoms to suggest C. difficile  continue probiotic and as needed Imodium - improved per patient report   Paroxysmal A-fib Continue Coreg. Chronically anticoagulated with Eliquis.  It was resumed after surgery.  Chronic diastolic CHF HTN Volume status stable on dialysis PTA meds- Carvedilol, Imdur.  Currently both on hold because of low blood pressure.   CAD, HLD Continue aspirin, Lipitor  Type 2 diabetes mellitus Hypoglycemia A1c 6.3 on 01/14/2024 PTA meds-Premeal sliding scale Currently blood sugars controlled on on SSI/Accu-Cheks.  BPH Continue finasteride and  Flomax   Mobility: PT/OT eval obtained.  SNF recommended  Goals of care   Code Status: Full Code     DVT prophylaxis:  SCD's Start: 01/15/24 1648 SCDs Start: 01/07/24 0018   Antimicrobials: Completed the course of IV antibiotics.   Fluid: None Consultants: Orthopedics, nephrology, vascular surgery Family Communication: None at bedside  Status: Inpatient Level of care:  Med-Surg   Patient is from: Home Needs to continue in-hospital care: Level of care Anticipated d/c to: SNF in 1 to 2 days      Diet:  Diet Order             Diet regular Room service appropriate? Yes; Fluid consistency: Thin  Diet effective now           Diet general                   Scheduled Meds:  (feeding supplement) PROSource Plus  30 mL Oral BID BM   vitamin C  1,000 mg Oral Daily   aspirin EC  81 mg Oral Daily   atorvastatin  80 mg Oral Daily   [START ON 01/23/2024] Chlorhexidine Gluconate Cloth  6 each Topical Q0600   doxercalciferol  2 mcg Intravenous Q M,W,F-HD   feeding supplement  237 mL Oral BID BM   feeding supplement  237 mL Oral BID BM   finasteride  5 mg Oral Daily   gabapentin  300 mg Oral BID   Gerhardt's butt cream  1 Application Topical BID   insulin aspart  0-5 Units Subcutaneous QHS   insulin aspart  0-9 Units Subcutaneous TID WC   leptospermum manuka honey  1 Application Topical Daily   nutrition supplement (JUVEN)  1 packet Oral BID BM   saccharomyces boulardii  250 mg Oral BID   sevelamer carbonate  1,600 mg Oral TID WC   tamsulosin  0.4 mg Oral QHS   zinc sulfate (50mg  elemental zinc)  220 mg Oral Daily    PRN meds: acetaminophen **OR** [DISCONTINUED] acetaminophen, HYDROmorphone (DILAUDID) injection, linaclotide, loperamide, ondansetron **OR** ondansetron (ZOFRAN) IV, oxyCODONE   Infusions:   sodium chloride      Antimicrobials: Anti-infectives (From admission, onward)    Start     Dose/Rate Route Frequency Ordered Stop   01/15/24 0600  ceFAZolin  (ANCEF) IVPB 2g/100 mL premix        2 g 200 mL/hr over 30 Minutes Intravenous On call to O.R. 01/14/24 2130 01/15/24 1510   01/15/24 0600  vancomycin (VANCOCIN) IVPB 1000 mg/200 mL premix  Status:  Discontinued        1,000 mg 200 mL/hr over 60 Minutes Intravenous On call to O.R. 01/14/24 2130 01/15/24 0836   01/10/24 1200  vancomycin (VANCOREADY) IVPB 750 mg/150 mL        750 mg 150 mL/hr over 60 Minutes Intravenous Every M-W-F (Hemodialysis) 01/08/24 1813 01/15/24 1150   01/08/24 1900  vancomycin (VANCOREADY) IVPB 1750 mg/350 mL        1,750 mg 175 mL/hr over 120 Minutes Intravenous  Once 01/08/24 1813 01/09/24 0231   01/07/24 0100  piperacillin-tazobactam (ZOSYN) IVPB 2.25 g        2.25 g 100 mL/hr over 30 Minutes Intravenous Every 8 hours 01/07/24 0031 01/16/24 2359   01/06/24 2330  Ampicillin-Sulbactam (UNASYN) 3 g in sodium chloride 0.9 % 100 mL IVPB  Status:  Discontinued       Placed in "And" Linked Group   3 g 200 mL/hr over 30 Minutes Intravenous  Once 01/06/24 2326 01/07/24 0019   01/06/24 2330  vancomycin (VANCOCIN) IVPB 1000 mg/200 mL premix       Placed in "And" Linked Group   1,000 mg 200 mL/hr over 60 Minutes Intravenous  Once 01/06/24 2326 01/07/24 0053       Objective: Vitals:   01/22/24 1132 01/22/24 1703  BP: 137/68 (!) 116/50  Pulse: 80 75  Resp: 17 19  Temp:  (!) 97.5 F (36.4 C)  SpO2:  100%    Intake/Output Summary (Last 24 hours) at 01/22/2024 1914 Last data filed at 01/22/2024 1300 Gross per 24 hour  Intake 240 ml  Output 0 ml  Net 240 ml   Filed Weights   01/15/24 1332 01/17/24 0852 01/17/24 1328  Weight: 69.7 kg 64.9 kg 64.3 kg   Weight change:  Body mass index is 17.72 kg/m.   Physical Exam: General: NAD  Cardiovascular: S1, S2 present Respiratory: CTAB Abdomen: Soft, nontender, nondistended, bowel sounds present Musculoskeletal: Left BKA, right AKA Skin: Normal Psychiatry: Normal mood     Signed, Briant Cedar,  MD [facility] 01/22/2024

## 2024-01-22 NOTE — TOC Progression Note (Signed)
 Transition of Care [facility]) - Progression Note    Patient Details  Name: William Webb MRN: [id number] Date of Birth: 11/20/1950  [facility]) CM/SW Contact  Erin Sons, [place] Phone Number: 01/22/2024, 1:21 PM  Clinical Narrative:     SNF auth restarted. Ref#  [id number] Jae Dire is pending.  Expected Discharge Plan: Skilled Nursing Facility Barriers to Discharge: Continued Medical Work up  Expected Discharge Plan and Services In-house Referral: Clinical Social Work Discharge Planning Services: CM Consult Post Acute Care Choice: Skilled Nursing Facility Living arrangements for the past 2 months: Apartment                                       Social Determinants of Health (SDOH) Interventions SDOH Screenings   Food Insecurity: No Food Insecurity (01/07/2024)  Housing: Low Risk  (01/07/2024)  Transportation Needs: No Transportation Needs (01/07/2024)  Utilities: Not At Risk (01/07/2024)  Depression (PHQ2-9): Low Risk  (10/07/2020)  Social Connections: Socially Isolated (01/07/2024)  Tobacco Use: Medium Risk (01/15/2024)    Readmission Risk Interventions    05/22/2023   11:05 AM 03/22/2023    4:41 PM 03/19/2022    3:43 PM  Readmission Risk Prevention Plan  Transportation Screening Complete Complete Complete  Medication Review (RN Care Manager) Referral to Pharmacy Complete Complete  PCP or Specialist appointment within 3-5 days of discharge Complete Complete Complete  HRI or Home Care Consult Complete Complete Complete  SW Recovery Care/Counseling Consult Complete Complete Complete  Palliative Care Screening Not Applicable Not Applicable Not Applicable  Skilled Nursing Facility Not Applicable Not Applicable Not Applicable

## 2024-01-22 NOTE — Progress Notes (Signed)
 OT Cancellation Note  Patient Details Name: William [name] MRN: [id number] DOB: 1951-02-19   Cancelled Treatment:    Reason Eval/Treat Not Completed: Patient at procedure or test/ unavailable --Pt going to cath lab and then HD. Will return at later date  Barry Brunner, [place] [facility] Office [phone] Secure Chat Preferred    Chancy Milroy 01/22/2024, 10:42 AM

## 2024-01-22 NOTE — Plan of Care (Signed)

## 2024-01-22 NOTE — Progress Notes (Addendum)
  Progress Note    01/22/2024 8:08 AM 7 Days Post-Op  Subjective:  no complaints; no further bleeding from L arm AVF   Vitals:   01/21/24 2200 01/22/24 0603  BP:  104/68  Pulse:    Resp:    Temp:    SpO2: 91%    Physical Exam: Lungs:  non labored Extremities:  pulsatile flow through L arm AVF; no bleeding; palpable L radial Neurologic: A&O  CBC    Component Value Date/Time   WBC 9.1 01/17/2024 0514   RBC 4.64 01/17/2024 0514   HGB 12.8 (L) 01/17/2024 0514   HGB 9.7 (L) 01/06/2020 1430   HCT 41.2 01/17/2024 0514   PLT 249 01/17/2024 0514   PLT 152 01/06/2020 1430   MCV 88.8 01/17/2024 0514   MCH 27.6 01/17/2024 0514   MCHC 31.1 01/17/2024 0514   RDW 15.2 01/17/2024 0514   LYMPHSABS 0.7 01/17/2024 0514   MONOABS 0.9 01/17/2024 0514   EOSABS 0.3 01/17/2024 0514   BASOSABS 0.1 01/17/2024 0514    BMET    Component Value Date/Time   NA 134 (L) 01/17/2024 0514   NA 140 09/22/2019 1507   K 3.7 01/17/2024 0514   CL 94 (L) 01/17/2024 0514   CO2 21 (L) 01/17/2024 0514   GLUCOSE 112 (H) 01/17/2024 0514   BUN 44 (H) 01/17/2024 0514   BUN 27 09/22/2019 1507   CREATININE 6.25 (H) 01/17/2024 0514   CREATININE 2.90 (H) 01/06/2020 1430   CALCIUM 9.2 01/17/2024 0514   CALCIUM 8.0 (L) 05/21/2023 0804   GFRNONAA 9 (L) 01/17/2024 0514   GFRNONAA 21 (L) 01/06/2020 1430   GFRAA 16 (L) 08/02/2020 0437   GFRAA 24 (L) 01/06/2020 1430    INR    Component Value Date/Time   INR 1.3 (H) 10/19/2023 2324     Intake/Output Summary (Last 24 hours) at 01/22/2024 1610 Last data filed at 01/22/2024 0600 Gross per 24 hour  Intake 120 ml  Output 0 ml  Net 120 ml     Assessment/Plan:  73 y.o. male with bleeding from L arm AVF 7 Days Post-Op   No further bleeding from L arm AVF Continue to hold Eliquis Keep npo Plan is for L arm fistulogram today to address likely outflow vein stenosis Consent ordered   Emilie Rutter, PA-C [facility] [phone] 01/22/2024 8:08 AM   VASCULAR STAFF ADDENDUM: I have independently interviewed and examined the patient. I agree with the above.    Victorino Sparrow MD [facility] Phone Number: [phone] 01/22/2024 11:53 AM

## 2024-01-22 NOTE — Consult Note (Signed)
 WOC consulted for open wound on patients buttock.  No photo in chart.    Please note that the [facility] nursing team is utilizing a standardized work plan to manage patient consults. We are triaging consults and will try to see the patients within 48 hours. Wound photos in the patient's chart allow [place] to consult on the patient in the most efficient and timely manner.    Thank you,    Priscella Mann MSN, RN-BC, [employer] [phone]

## 2024-01-22 NOTE — Progress Notes (Signed)
 PT Cancellation Note  Patient Details Name: William Webb [facility] MRN: [id number] DOB: May 08, 1951   Cancelled Treatment:    Reason Eval/Treat Not Completed: Patient at procedure or test/unavailable (Pt going to CATh lab and then HD. Will return at later date.)   William Webb 01/22/2024, 10:39 AM William Webb M,PT Acute Rehab Services [phone]

## 2024-01-23 ENCOUNTER — Encounter (HOSPITAL_COMMUNITY): Payer: Self-pay | Admitting: Vascular Surgery

## 2024-01-23 DIAGNOSIS — E1140 Type 2 diabetes mellitus with diabetic neuropathy, unspecified: Secondary | ICD-10-CM | POA: Diagnosis not present

## 2024-01-23 DIAGNOSIS — M159 Polyosteoarthritis, unspecified: Secondary | ICD-10-CM | POA: Diagnosis not present

## 2024-01-23 DIAGNOSIS — I3139 Other pericardial effusion (noninflammatory): Secondary | ICD-10-CM | POA: Diagnosis not present

## 2024-01-23 DIAGNOSIS — I959 Hypotension, unspecified: Secondary | ICD-10-CM | POA: Diagnosis not present

## 2024-01-23 DIAGNOSIS — Z79899 Other long term (current) drug therapy: Secondary | ICD-10-CM | POA: Diagnosis not present

## 2024-01-23 DIAGNOSIS — R52 Pain, unspecified: Secondary | ICD-10-CM | POA: Diagnosis not present

## 2024-01-23 DIAGNOSIS — H35033 Hypertensive retinopathy, bilateral: Secondary | ICD-10-CM | POA: Diagnosis not present

## 2024-01-23 DIAGNOSIS — Z8249 Family history of ischemic heart disease and other diseases of the circulatory system: Secondary | ICD-10-CM | POA: Diagnosis not present

## 2024-01-23 DIAGNOSIS — D696 Thrombocytopenia, unspecified: Secondary | ICD-10-CM | POA: Diagnosis not present

## 2024-01-23 DIAGNOSIS — K219 Gastro-esophageal reflux disease without esophagitis: Secondary | ICD-10-CM | POA: Diagnosis not present

## 2024-01-23 DIAGNOSIS — E113411 Type 2 diabetes mellitus with severe nonproliferative diabetic retinopathy with macular edema, right eye: Secondary | ICD-10-CM | POA: Diagnosis not present

## 2024-01-23 DIAGNOSIS — G479 Sleep disorder, unspecified: Secondary | ICD-10-CM | POA: Diagnosis not present

## 2024-01-23 DIAGNOSIS — I120 Hypertensive chronic kidney disease with stage 5 chronic kidney disease or end stage renal disease: Secondary | ICD-10-CM | POA: Diagnosis not present

## 2024-01-23 DIAGNOSIS — K5900 Constipation, unspecified: Secondary | ICD-10-CM | POA: Diagnosis not present

## 2024-01-23 DIAGNOSIS — M810 Age-related osteoporosis without current pathological fracture: Secondary | ICD-10-CM | POA: Diagnosis not present

## 2024-01-23 DIAGNOSIS — E1122 Type 2 diabetes mellitus with diabetic chronic kidney disease: Secondary | ICD-10-CM | POA: Diagnosis not present

## 2024-01-23 DIAGNOSIS — Z89519 Acquired absence of unspecified leg below knee: Secondary | ICD-10-CM | POA: Diagnosis not present

## 2024-01-23 DIAGNOSIS — R195 Other fecal abnormalities: Secondary | ICD-10-CM | POA: Diagnosis not present

## 2024-01-23 DIAGNOSIS — E113512 Type 2 diabetes mellitus with proliferative diabetic retinopathy with macular edema, left eye: Secondary | ICD-10-CM | POA: Diagnosis not present

## 2024-01-23 DIAGNOSIS — I953 Hypotension of hemodialysis: Secondary | ICD-10-CM | POA: Diagnosis not present

## 2024-01-23 DIAGNOSIS — I25118 Atherosclerotic heart disease of native coronary artery with other forms of angina pectoris: Secondary | ICD-10-CM | POA: Diagnosis not present

## 2024-01-23 DIAGNOSIS — Z4789 Encounter for other orthopedic aftercare: Secondary | ICD-10-CM | POA: Diagnosis not present

## 2024-01-23 DIAGNOSIS — Z8551 Personal history of malignant neoplasm of bladder: Secondary | ICD-10-CM | POA: Diagnosis not present

## 2024-01-23 DIAGNOSIS — E871 Hypo-osmolality and hyponatremia: Secondary | ICD-10-CM | POA: Diagnosis not present

## 2024-01-23 DIAGNOSIS — D6869 Other thrombophilia: Secondary | ICD-10-CM | POA: Diagnosis not present

## 2024-01-23 DIAGNOSIS — R531 Weakness: Secondary | ICD-10-CM | POA: Diagnosis not present

## 2024-01-23 DIAGNOSIS — D638 Anemia in other chronic diseases classified elsewhere: Secondary | ICD-10-CM | POA: Diagnosis not present

## 2024-01-23 DIAGNOSIS — E1151 Type 2 diabetes mellitus with diabetic peripheral angiopathy without gangrene: Secondary | ICD-10-CM | POA: Diagnosis not present

## 2024-01-23 DIAGNOSIS — D61818 Other pancytopenia: Secondary | ICD-10-CM | POA: Diagnosis present

## 2024-01-23 DIAGNOSIS — I4891 Unspecified atrial fibrillation: Secondary | ICD-10-CM | POA: Diagnosis not present

## 2024-01-23 DIAGNOSIS — A419 Sepsis, unspecified organism: Secondary | ICD-10-CM | POA: Diagnosis not present

## 2024-01-23 DIAGNOSIS — E1142 Type 2 diabetes mellitus with diabetic polyneuropathy: Secondary | ICD-10-CM | POA: Diagnosis not present

## 2024-01-23 DIAGNOSIS — E569 Vitamin deficiency, unspecified: Secondary | ICD-10-CM | POA: Diagnosis not present

## 2024-01-23 DIAGNOSIS — E46 Unspecified protein-calorie malnutrition: Secondary | ICD-10-CM | POA: Diagnosis not present

## 2024-01-23 DIAGNOSIS — G546 Phantom limb syndrome with pain: Secondary | ICD-10-CM | POA: Diagnosis not present

## 2024-01-23 DIAGNOSIS — Z89511 Acquired absence of right leg below knee: Secondary | ICD-10-CM | POA: Diagnosis not present

## 2024-01-23 DIAGNOSIS — R2689 Other abnormalities of gait and mobility: Secondary | ICD-10-CM | POA: Diagnosis not present

## 2024-01-23 DIAGNOSIS — Z7409 Other reduced mobility: Secondary | ICD-10-CM | POA: Diagnosis not present

## 2024-01-23 DIAGNOSIS — T82858A Stenosis of vascular prosthetic devices, implants and grafts, initial encounter: Secondary | ICD-10-CM | POA: Diagnosis present

## 2024-01-23 DIAGNOSIS — R197 Diarrhea, unspecified: Secondary | ICD-10-CM | POA: Diagnosis not present

## 2024-01-23 DIAGNOSIS — G894 Chronic pain syndrome: Secondary | ICD-10-CM | POA: Diagnosis not present

## 2024-01-23 DIAGNOSIS — Z7401 Bed confinement status: Secondary | ICD-10-CM | POA: Diagnosis not present

## 2024-01-23 DIAGNOSIS — Z8349 Family history of other endocrine, nutritional and metabolic diseases: Secondary | ICD-10-CM | POA: Diagnosis not present

## 2024-01-23 DIAGNOSIS — M1990 Unspecified osteoarthritis, unspecified site: Secondary | ICD-10-CM | POA: Diagnosis not present

## 2024-01-23 DIAGNOSIS — S81001D Unspecified open wound, right knee, subsequent encounter: Secondary | ICD-10-CM | POA: Diagnosis not present

## 2024-01-23 DIAGNOSIS — I480 Paroxysmal atrial fibrillation: Secondary | ICD-10-CM | POA: Diagnosis not present

## 2024-01-23 DIAGNOSIS — Z87891 Personal history of nicotine dependence: Secondary | ICD-10-CM | POA: Diagnosis not present

## 2024-01-23 DIAGNOSIS — M6281 Muscle weakness (generalized): Secondary | ICD-10-CM | POA: Diagnosis not present

## 2024-01-23 DIAGNOSIS — E11628 Type 2 diabetes mellitus with other skin complications: Secondary | ICD-10-CM | POA: Diagnosis not present

## 2024-01-23 DIAGNOSIS — N2581 Secondary hyperparathyroidism of renal origin: Secondary | ICD-10-CM | POA: Diagnosis not present

## 2024-01-23 DIAGNOSIS — I1311 Hypertensive heart and chronic kidney disease without heart failure, with stage 5 chronic kidney disease, or end stage renal disease: Secondary | ICD-10-CM | POA: Diagnosis not present

## 2024-01-23 DIAGNOSIS — E1169 Type 2 diabetes mellitus with other specified complication: Secondary | ICD-10-CM | POA: Diagnosis not present

## 2024-01-23 DIAGNOSIS — Z7189 Other specified counseling: Secondary | ICD-10-CM | POA: Diagnosis not present

## 2024-01-23 DIAGNOSIS — E1159 Type 2 diabetes mellitus with other circulatory complications: Secondary | ICD-10-CM | POA: Diagnosis not present

## 2024-01-23 DIAGNOSIS — Z833 Family history of diabetes mellitus: Secondary | ICD-10-CM | POA: Diagnosis not present

## 2024-01-23 DIAGNOSIS — E1129 Type 2 diabetes mellitus with other diabetic kidney complication: Secondary | ICD-10-CM | POA: Diagnosis not present

## 2024-01-23 DIAGNOSIS — J302 Other seasonal allergic rhinitis: Secondary | ICD-10-CM | POA: Diagnosis not present

## 2024-01-23 DIAGNOSIS — D509 Iron deficiency anemia, unspecified: Secondary | ICD-10-CM | POA: Diagnosis not present

## 2024-01-23 DIAGNOSIS — E785 Hyperlipidemia, unspecified: Secondary | ICD-10-CM | POA: Diagnosis not present

## 2024-01-23 DIAGNOSIS — I10 Essential (primary) hypertension: Secondary | ICD-10-CM | POA: Diagnosis not present

## 2024-01-23 DIAGNOSIS — Z800 Family history of malignant neoplasm of digestive organs: Secondary | ICD-10-CM | POA: Diagnosis not present

## 2024-01-23 DIAGNOSIS — Y832 Surgical operation with anastomosis, bypass or graft as the cause of abnormal reaction of the patient, or of later complication, without mention of misadventure at the time of the procedure: Secondary | ICD-10-CM | POA: Diagnosis not present

## 2024-01-23 DIAGNOSIS — E8809 Other disorders of plasma-protein metabolism, not elsewhere classified: Secondary | ICD-10-CM | POA: Diagnosis not present

## 2024-01-23 DIAGNOSIS — D649 Anemia, unspecified: Secondary | ICD-10-CM | POA: Diagnosis not present

## 2024-01-23 DIAGNOSIS — I350 Nonrheumatic aortic (valve) stenosis: Secondary | ICD-10-CM | POA: Diagnosis not present

## 2024-01-23 DIAGNOSIS — Z992 Dependence on renal dialysis: Secondary | ICD-10-CM | POA: Diagnosis not present

## 2024-01-23 DIAGNOSIS — R109 Unspecified abdominal pain: Secondary | ICD-10-CM | POA: Diagnosis not present

## 2024-01-23 DIAGNOSIS — S81802A Unspecified open wound, left lower leg, initial encounter: Secondary | ICD-10-CM | POA: Diagnosis not present

## 2024-01-23 DIAGNOSIS — I96 Gangrene, not elsewhere classified: Secondary | ICD-10-CM | POA: Diagnosis not present

## 2024-01-23 DIAGNOSIS — R5381 Other malaise: Secondary | ICD-10-CM | POA: Diagnosis not present

## 2024-01-23 DIAGNOSIS — R1110 Vomiting, unspecified: Secondary | ICD-10-CM | POA: Diagnosis not present

## 2024-01-23 DIAGNOSIS — D631 Anemia in chronic kidney disease: Secondary | ICD-10-CM | POA: Diagnosis not present

## 2024-01-23 DIAGNOSIS — I2510 Atherosclerotic heart disease of native coronary artery without angina pectoris: Secondary | ICD-10-CM | POA: Diagnosis not present

## 2024-01-23 DIAGNOSIS — L089 Local infection of the skin and subcutaneous tissue, unspecified: Secondary | ICD-10-CM | POA: Diagnosis not present

## 2024-01-23 DIAGNOSIS — S81001A Unspecified open wound, right knee, initial encounter: Secondary | ICD-10-CM | POA: Diagnosis not present

## 2024-01-23 DIAGNOSIS — R110 Nausea: Secondary | ICD-10-CM | POA: Diagnosis not present

## 2024-01-23 DIAGNOSIS — M797 Fibromyalgia: Secondary | ICD-10-CM | POA: Diagnosis not present

## 2024-01-23 DIAGNOSIS — Z743 Need for continuous supervision: Secondary | ICD-10-CM | POA: Diagnosis not present

## 2024-01-23 DIAGNOSIS — E7800 Pure hypercholesterolemia, unspecified: Secondary | ICD-10-CM | POA: Diagnosis not present

## 2024-01-23 DIAGNOSIS — I509 Heart failure, unspecified: Secondary | ICD-10-CM | POA: Diagnosis not present

## 2024-01-23 DIAGNOSIS — I4819 Other persistent atrial fibrillation: Secondary | ICD-10-CM | POA: Diagnosis not present

## 2024-01-23 DIAGNOSIS — M86172 Other acute osteomyelitis, left ankle and foot: Secondary | ICD-10-CM | POA: Diagnosis not present

## 2024-01-23 DIAGNOSIS — I739 Peripheral vascular disease, unspecified: Secondary | ICD-10-CM | POA: Diagnosis present

## 2024-01-23 DIAGNOSIS — Z7901 Long term (current) use of anticoagulants: Secondary | ICD-10-CM | POA: Diagnosis not present

## 2024-01-23 DIAGNOSIS — I252 Old myocardial infarction: Secondary | ICD-10-CM | POA: Diagnosis not present

## 2024-01-23 DIAGNOSIS — Z4781 Encounter for orthopedic aftercare following surgical amputation: Secondary | ICD-10-CM | POA: Diagnosis not present

## 2024-01-23 DIAGNOSIS — I132 Hypertensive heart and chronic kidney disease with heart failure and with stage 5 chronic kidney disease, or end stage renal disease: Secondary | ICD-10-CM | POA: Diagnosis not present

## 2024-01-23 DIAGNOSIS — N186 End stage renal disease: Secondary | ICD-10-CM | POA: Diagnosis not present

## 2024-01-23 DIAGNOSIS — Z0000 Encounter for general adult medical examination without abnormal findings: Secondary | ICD-10-CM | POA: Diagnosis not present

## 2024-01-23 DIAGNOSIS — E43 Unspecified severe protein-calorie malnutrition: Secondary | ICD-10-CM | POA: Diagnosis not present

## 2024-01-23 DIAGNOSIS — R300 Dysuria: Secondary | ICD-10-CM | POA: Diagnosis not present

## 2024-01-23 DIAGNOSIS — Z89512 Acquired absence of left leg below knee: Secondary | ICD-10-CM | POA: Diagnosis not present

## 2024-01-23 DIAGNOSIS — I5032 Chronic diastolic (congestive) heart failure: Secondary | ICD-10-CM | POA: Diagnosis not present

## 2024-01-23 DIAGNOSIS — Z1159 Encounter for screening for other viral diseases: Secondary | ICD-10-CM | POA: Diagnosis not present

## 2024-01-23 LAB — RENAL FUNCTION PANEL
Albumin: 2.4 g/dL — ABNORMAL LOW (ref 3.5–5.0)
Anion gap: 18 — ABNORMAL HIGH (ref 5–15)
BUN: 66 mg/dL — ABNORMAL HIGH (ref 8–23)
CO2: 22 mmol/L (ref 22–32)
Calcium: 8.9 mg/dL (ref 8.9–10.3)
Chloride: 91 mmol/L — ABNORMAL LOW (ref 98–111)
Creatinine, Ser: 7.5 mg/dL — ABNORMAL HIGH (ref 0.61–1.24)
GFR, Estimated: 7 mL/min — ABNORMAL LOW (ref >60)
Glucose, Bld: 160 mg/dL — ABNORMAL HIGH (ref 70–99)
Phosphorus: 5.7 mg/dL — ABNORMAL HIGH (ref 2.5–4.6)
Potassium: 4.1 mmol/L (ref 3.5–5.1)
Sodium: 131 mmol/L — ABNORMAL LOW (ref 135–145)

## 2024-01-23 LAB — CBC WITH DIFFERENTIAL/PLATELET
Abs Immature Granulocytes: 0.04 10*3/uL (ref 0.00–0.07)
Basophils Absolute: 0.1 10*3/uL (ref 0.0–0.1)
Basophils Relative: 1 %
Eosinophils Absolute: 0.3 10*3/uL (ref 0.0–0.5)
Eosinophils Relative: 4 %
HCT: 35.2 % — ABNORMAL LOW (ref 39.0–52.0)
Hemoglobin: 11.3 g/dL — ABNORMAL LOW (ref 13.0–17.0)
Immature Granulocytes: 1 %
Lymphocytes Relative: 12 %
Lymphs Abs: 0.8 10*3/uL (ref 0.7–4.0)
MCH: 27.7 pg (ref 26.0–34.0)
MCHC: 32.1 g/dL (ref 30.0–36.0)
MCV: 86.3 fL (ref 80.0–100.0)
Monocytes Absolute: 0.5 10*3/uL (ref 0.1–1.0)
Monocytes Relative: 7 %
Neutro Abs: 5.5 10*3/uL (ref 1.7–7.7)
Neutrophils Relative %: 75 %
Platelets: 207 10*3/uL (ref 150–400)
RBC: 4.08 MIL/uL — ABNORMAL LOW (ref 4.22–5.81)
RDW: 15 % (ref 11.5–15.5)
WBC: 7.3 10*3/uL (ref 4.0–10.5)
nRBC: 0 % (ref 0.0–0.2)

## 2024-01-23 LAB — GLUCOSE, CAPILLARY: Glucose-Capillary: 273 mg/dL — ABNORMAL HIGH (ref 70–99)

## 2024-01-23 MED ORDER — HEPARIN SODIUM (PORCINE) 1000 UNIT/ML DIALYSIS: 1000.0000 [IU] | INTRAMUSCULAR | Status: DC | PRN

## 2024-01-23 MED ORDER — MEDIHONEY WOUND/BURN DRESSING EX PSTE: 1.0000 | PASTE | Freq: Every day | CUTANEOUS | Status: DC

## 2024-01-23 MED ORDER — PENTAFLUOROPROP-TETRAFLUOROETH EX AERO: 1.0000 | INHALATION_SPRAY | CUTANEOUS | Status: DC | PRN

## 2024-01-23 MED ORDER — LIDOCAINE-PRILOCAINE 2.5-2.5 % EX CREA: 1.0000 | TOPICAL_CREAM | CUTANEOUS | Status: DC | PRN

## 2024-01-23 MED ORDER — ALTEPLASE 2 MG IJ SOLR: 2.0000 mg | Freq: Once | INTRAMUSCULAR | Status: DC | PRN

## 2024-01-23 MED ORDER — LIDOCAINE HCL (PF) 1 % IJ SOLN: 5.0000 mL | INTRAMUSCULAR | Status: DC | PRN
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
## 2024-01-23 NOTE — Procedures (Signed)
 Received patient in bed to unit.  Alert and oriented.  Informed consent signed and in chart.   TX duration:3.5 hours  Patient tolerated well.  Transported back to the room  Alert, without acute distress.  Hand-off given to patient's nurse.   Access used: left avf Access issues: none  Total UF removed: 0 Medication(s) given: doxercalciferol   Lu Duffel, RN [facility]

## 2024-01-23 NOTE — Progress Notes (Signed)
 DISCHARGE NOTE SNF [facility] to be discharged [facility] per MD order. Patient verbalized understanding.  Skin clean, dry and intact without evidence of skin break down, no evidence of skin tears noted. IV catheter discontinued intact. Site without signs and symptoms of complications. Dressing and pressure applied. Pt denies pain at the site currently. No complaints noted.  Patient discharging with noted incision and wounds as present on LDA.  Patient free of other lines, drains, and wounds.   Discharge packet assembled. An After Visit Summary (AVS) was printed and given to the EMS personnel. Patient escorted via stretcher and discharged to [facility] via ambulance. Report called to accepting facility; all questions and concerns addressed.   Velia Meyer, RN

## 2024-01-23 NOTE — Procedures (Signed)
 I was present at this dialysis session. I have reviewed the session itself and made appropriate changes.   Vital signs in last 24 hours:  Temp:  [97.5 F (36.4 C)-97.9 F (36.6 C)] 97.9 F (36.6 C) (03/27 0755) Pulse Rate:  [64-83] 71 (03/27 0822) Resp:  [11-27] 16 (03/27 0822) BP: (101-139)/(50-77) 139/59 (03/27 0822) SpO2:  [98 %-100 %] 100 % (03/27 0822) Weight:  [66.3 kg] 66.3 kg (03/27 0755) Weight change:  Filed Weights   01/17/24 0852 01/17/24 1328 01/23/24 0755  Weight: 64.9 kg 64.3 kg 66.3 kg    Recent Labs  Lab 01/17/24 0514 01/22/24 0955  NA 134* 134*  K 3.7 3.9  CL 94* 92*  CO2 21* 25  GLUCOSE 112* 152*  BUN 44* 52*  CREATININE 6.25* 6.18*  CALCIUM 9.2 9.3  PHOS 5.3*  --     Recent Labs  Lab 01/17/24 0514 01/22/24 0955 01/23/24 0825  WBC 9.1 7.5 7.3  NEUTROABS 7.1 5.9 5.5  HGB 12.8* 11.9* 11.3*  HCT 41.2 37.7* 35.2*  MCV 88.8 86.1 86.3  PLT 249 217 207    Scheduled Meds:  (feeding supplement) PROSource Plus  30 mL Oral BID BM   vitamin C  1,000 mg Oral Daily   aspirin EC  81 mg Oral Daily   atorvastatin  80 mg Oral Daily   Chlorhexidine Gluconate Cloth  6 each Topical Q0600   doxercalciferol  2 mcg Intravenous Q M,W,F-HD   feeding supplement  237 mL Oral BID BM   feeding supplement  237 mL Oral BID BM   finasteride  5 mg Oral Daily   gabapentin  300 mg Oral BID   Gerhardt's butt cream  1 Application Topical BID   insulin aspart  0-5 Units Subcutaneous QHS   insulin aspart  0-9 Units Subcutaneous TID WC   leptospermum manuka honey  1 Application Topical Daily   nutrition supplement (JUVEN)  1 packet Oral BID BM   saccharomyces boulardii  250 mg Oral BID   sevelamer carbonate  1,600 mg Oral TID WC   tamsulosin  0.4 mg Oral QHS   zinc sulfate (50mg  elemental zinc)  220 mg Oral Daily   Continuous Infusions:  sodium chloride 10 mL/hr at 01/23/24 0525   PRN Meds:.acetaminophen **OR** [DISCONTINUED] acetaminophen, alteplase, heparin,  HYDROmorphone (DILAUDID) injection, lidocaine (PF), lidocaine-prilocaine, linaclotide, loperamide, ondansetron **OR** ondansetron (ZOFRAN) IV, oxyCODONE, pentafluoroprop-tetrafluoroeth   Irena Cords,  MD 01/23/2024, 8:49 AM

## 2024-01-23 NOTE — TOC Transition Note (Signed)
 Transition of Care [facility]) - Discharge Note   Patient Details  Name: William Webb MRN: [id number] Date of Birth: 10-15-1951  Transition of Care [facility]) CM/SW Contact:  Lorri Frederick, LCSW Phone Number: 01/23/2024, 2:09 PM   Clinical Narrative:   Pt discharging to [facility].  RN call report to [phone].  PTAR called [phone].  Final next level of care: Skilled Nursing Facility Barriers to Discharge: Barriers Resolved   Patient Goals and CMS Choice Patient states their goals for this hospitalization and ongoing recovery are:: SNF          Discharge Placement              Patient chooses bed at: [facility] Patient to be transferred to facility by: ptar Name of family member notified: nephew [name] Patient and family notified of of transfer: 01/23/24  Discharge Plan and Services Additional resources added to the After Visit Summary for   In-house Referral: Clinical Social Work Discharge Planning Services: CM Consult Post Acute Care Choice: Skilled Nursing Facility                               Social Drivers of Health (SDOH) Interventions SDOH Screenings   Food Insecurity: No Food Insecurity (01/07/2024)  Housing: Low Risk  (01/07/2024)  Transportation Needs: No Transportation Needs (01/07/2024)  Utilities: Not At Risk (01/07/2024)  Depression (PHQ2-9): Low Risk  (10/07/2020)  Social Connections: Socially Isolated (01/07/2024)  Tobacco Use: Medium Risk (01/15/2024)     Readmission Risk Interventions    05/22/2023   11:05 AM 03/22/2023    4:41 PM 03/19/2022    3:43 PM  Readmission Risk Prevention Plan  Transportation Screening Complete Complete Complete  Medication Review (RN Care Manager) Referral to Pharmacy Complete Complete  PCP or Specialist appointment within 3-5 days of discharge Complete Complete Complete  HRI or Home Care Consult Complete Complete Complete  SW Recovery Care/Counseling Consult Complete Complete  Complete  Palliative Care Screening Not Applicable Not Applicable Not Applicable  Skilled Nursing Facility Not Applicable Not Applicable Not Applicable

## 2024-01-23 NOTE — Progress Notes (Signed)
 PT Cancellation Note  Patient Details Name: William Webb [facility] MRN: [id number] DOB: [birth year]   Cancelled Treatment:    Reason Eval/Treat Not Completed: Patient at procedure or test/unavailable. Pt in HD. PT to re-attempt as time allows.   Ilda Foil 01/23/2024, 8:17 AM

## 2024-01-23 NOTE — Discharge Planning (Signed)
 [facility] Patient Discharge Orders- [facility] CLINIC: [facility]. 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 Lajoyce Corners;  Wound VAC in place. At discharge, to continue Prevena plus portable wound VAC pump;   Bleeding from access - see below Antibiotic associated diarrhea - resolved  Aranesp: Given: No    Last Hgb: 11.3 PRBC's Given: No  ESA dose for discharge: Hold ESA for now. Restart ESA protocol if Hgb drops below 10. IV Iron dose at discharge: Continue weekly Fe  Heparin change: No  EDW Change: Yes New EDW: Lower to 66kg. Under EDWs here. Notify renal team for any discrepancies   Bath Change: No  Access intervention/Change: No Details: Patient had excessive bleeding at access with HD this week. S/p F'gram with successful angioplasty 3/26 by Dr. Karin Lieu. Per VVS, AVF can continue to be used with next HD.   Hectorol change: No  Discharge Labs: Calcium 8.9 Phosphorus 5.7 Albumin 2.4 K+ 4.1  IV Antibiotics: No  On Coumadin?: No. On Eliquis   OTHER/APPTS/LAB ORDERS: PLEASE RUN HD TOMORROW (01/24/24) FOR 3hrs ONLY!!!!!!!!! He can resume normal time next week.    D/C Meds to be reconciled by nurse after every discharge.  Completed By: Salome Holmes, NP   Reviewed by: MD:______ RN_______

## 2024-01-23 NOTE — Plan of Care (Signed)

## 2024-01-23 NOTE — TOC Progression Note (Addendum)
 Transition of Care [facility]) - Progression Note    Patient Details  Name: William Webb MRN: [id number] Date of Birth: 06-Apr-1951  [facility] CM/SW Contact  Lorri Frederick, LCSW Phone Number: 01/23/2024, 1:10 PM  Clinical Narrative:   CSW confirmed with [facility] that they can receive pt today.  SNF auth approved in [place]: [id number], 5 days: 3/27-3/31.    HD schedule confirmed with Tracy/Renal: MWF 1045AM chair time [facility] SW.  Confirmed with [facility] that pt will resume outpt HD tomorrow.   Expected Discharge Plan: Skilled Nursing Facility Barriers to Discharge: Continued Medical Work up  Expected Discharge Plan and Services In-house Referral: Clinical Social Work Discharge Planning Services: CM Consult Post Acute Care Choice: Skilled Nursing Facility Living arrangements for the past 2 months: Apartment Expected Discharge Date: 01/23/24                                     Social Determinants of Health (SDOH) Interventions SDOH Screenings   Food Insecurity: No Food Insecurity (01/07/2024)  Housing: Low Risk  (01/07/2024)  Transportation Needs: No Transportation Needs (01/07/2024)  Utilities: Not At Risk (01/07/2024)  Depression (PHQ2-9): Low Risk  (10/07/2020)  Social Connections: Socially Isolated (01/07/2024)  Tobacco Use: Medium Risk (01/15/2024)    Readmission Risk Interventions    05/22/2023   11:05 AM 03/22/2023    4:41 PM 03/19/2022    3:43 PM  Readmission Risk Prevention Plan  Transportation Screening Complete Complete Complete  Medication Review (RN Care Manager) Referral to Pharmacy Complete Complete  PCP or Specialist appointment within 3-5 days of discharge Complete Complete Complete  HRI or Home Care Consult Complete Complete Complete  SW Recovery Care/Counseling Consult Complete Complete Complete  Palliative Care Screening Not Applicable Not Applicable Not Applicable  Skilled Nursing Facility Not Applicable Not Applicable Not  Applicable

## 2024-01-23 NOTE — Progress Notes (Signed)
 D/C order noted. Confirmed with renal NP that pt should resume at out-pt clinic tomorrow. CSW aware. Contacted [facility] to be advised of pt's d/c to snf today and that pt should resume care tomorrow. HD info provided to CSW to provide to snf. Clinic provided snf name.   Olivia Canter Renal Navigator [phone]

## 2024-01-23 NOTE — Progress Notes (Signed)
  Progress Note    01/23/2024 7:46 AM 1 Day Post-Op  Subjective:  no complaints   Vitals:   01/22/24 1703 01/23/24 0215  BP: (!) 116/50 101/72  Pulse: 75 83  Resp: 19 18  Temp: (!) 97.5 F (36.4 C) 97.6 F (36.4 C)  SpO2: 100% 98%   Physical Exam: Lungs:  non labored Extremities:  L arm AVF with soft thrill Neurologic: a&O  CBC    Component Value Date/Time   WBC 7.5 01/22/2024 0955   RBC 4.38 01/22/2024 0955   HGB 11.9 (L) 01/22/2024 0955   HGB 9.7 (L) 01/06/2020 1430   HCT 37.7 (L) 01/22/2024 0955   PLT 217 01/22/2024 0955   PLT 152 01/06/2020 1430   MCV 86.1 01/22/2024 0955   MCH 27.2 01/22/2024 0955   MCHC 31.6 01/22/2024 0955   RDW 14.9 01/22/2024 0955   LYMPHSABS 0.7 01/22/2024 0955   MONOABS 0.7 01/22/2024 0955   EOSABS 0.3 01/22/2024 0955   BASOSABS 0.0 01/22/2024 0955    BMET    Component Value Date/Time   NA 134 (L) 01/22/2024 0955   NA 140 09/22/2019 1507   K 3.9 01/22/2024 0955   CL 92 (L) 01/22/2024 0955   CO2 25 01/22/2024 0955   GLUCOSE 152 (H) 01/22/2024 0955   BUN 52 (H) 01/22/2024 0955   BUN 27 09/22/2019 1507   CREATININE 6.18 (H) 01/22/2024 0955   CREATININE 2.90 (H) 01/06/2020 1430   CALCIUM 9.3 01/22/2024 0955   CALCIUM 8.0 (L) 05/21/2023 0804   GFRNONAA 9 (L) 01/22/2024 0955   GFRNONAA 21 (L) 01/06/2020 1430   GFRAA 16 (L) 08/02/2020 0437   GFRAA 24 (L) 01/06/2020 1430    INR    Component Value Date/Time   INR 1.3 (H) 10/19/2023 2324     Intake/Output Summary (Last 24 hours) at 01/23/2024 0746 Last data filed at 01/22/2024 1300 Gross per 24 hour  Intake 120 ml  Output 0 ml  Net 120 ml     Assessment/Plan:  73 y.o. male is s/p angioplasty of L basilic vein1 Day Post-Op   L arm AVF patent with palpable thrill at anastomosis Ok to cannulate this morning for HD Resume Eliquis if AVF functions properly on HD Vascular will sign off   Emilie Rutter, PA-C [facility] [phone] 01/23/2024 7:46 AM

## 2024-01-24 DIAGNOSIS — Z992 Dependence on renal dialysis: Secondary | ICD-10-CM | POA: Diagnosis not present

## 2024-01-24 DIAGNOSIS — Z89512 Acquired absence of left leg below knee: Secondary | ICD-10-CM | POA: Diagnosis not present

## 2024-01-24 DIAGNOSIS — N2581 Secondary hyperparathyroidism of renal origin: Secondary | ICD-10-CM | POA: Diagnosis not present

## 2024-01-24 DIAGNOSIS — J302 Other seasonal allergic rhinitis: Secondary | ICD-10-CM | POA: Diagnosis not present

## 2024-01-24 DIAGNOSIS — E785 Hyperlipidemia, unspecified: Secondary | ICD-10-CM | POA: Diagnosis not present

## 2024-01-24 DIAGNOSIS — K219 Gastro-esophageal reflux disease without esophagitis: Secondary | ICD-10-CM | POA: Diagnosis not present

## 2024-01-24 DIAGNOSIS — I953 Hypotension of hemodialysis: Secondary | ICD-10-CM | POA: Diagnosis not present

## 2024-01-24 DIAGNOSIS — N186 End stage renal disease: Secondary | ICD-10-CM | POA: Diagnosis not present

## 2024-01-24 DIAGNOSIS — K5900 Constipation, unspecified: Secondary | ICD-10-CM | POA: Diagnosis not present

## 2024-01-24 DIAGNOSIS — I4891 Unspecified atrial fibrillation: Secondary | ICD-10-CM | POA: Diagnosis not present

## 2024-01-24 DIAGNOSIS — E43 Unspecified severe protein-calorie malnutrition: Secondary | ICD-10-CM | POA: Diagnosis not present

## 2024-01-24 DIAGNOSIS — D509 Iron deficiency anemia, unspecified: Secondary | ICD-10-CM | POA: Diagnosis not present

## 2024-01-24 DIAGNOSIS — D631 Anemia in chronic kidney disease: Secondary | ICD-10-CM | POA: Diagnosis not present

## 2024-01-24 DIAGNOSIS — R52 Pain, unspecified: Secondary | ICD-10-CM | POA: Diagnosis not present

## 2024-01-24 DIAGNOSIS — E569 Vitamin deficiency, unspecified: Secondary | ICD-10-CM | POA: Diagnosis not present

## 2024-01-24 DIAGNOSIS — R197 Diarrhea, unspecified: Secondary | ICD-10-CM | POA: Diagnosis not present

## 2024-01-24 NOTE — Care Management Important Message (Signed)
 Important Message  Patient Details  Name: William Webb MRN: [id number] Date of Birth: 1951-10-03   Important Message Given:  Yes - Medicare IM Patient left prior to IM delivery will mail a copy to the patient home address.     [name] 01/24/2024, 10:53 AM

## 2024-01-26 DIAGNOSIS — Z992 Dependence on renal dialysis: Secondary | ICD-10-CM | POA: Diagnosis not present

## 2024-01-26 DIAGNOSIS — R52 Pain, unspecified: Secondary | ICD-10-CM | POA: Diagnosis not present

## 2024-01-26 DIAGNOSIS — Z7409 Other reduced mobility: Secondary | ICD-10-CM | POA: Diagnosis not present

## 2024-01-26 DIAGNOSIS — R531 Weakness: Secondary | ICD-10-CM | POA: Diagnosis not present

## 2024-01-26 DIAGNOSIS — N186 End stage renal disease: Secondary | ICD-10-CM | POA: Diagnosis not present

## 2024-01-27 DIAGNOSIS — E1129 Type 2 diabetes mellitus with other diabetic kidney complication: Secondary | ICD-10-CM | POA: Diagnosis not present

## 2024-01-27 DIAGNOSIS — D509 Iron deficiency anemia, unspecified: Secondary | ICD-10-CM | POA: Diagnosis not present

## 2024-01-27 DIAGNOSIS — N186 End stage renal disease: Secondary | ICD-10-CM | POA: Diagnosis not present

## 2024-01-27 DIAGNOSIS — R197 Diarrhea, unspecified: Secondary | ICD-10-CM | POA: Diagnosis not present

## 2024-01-27 DIAGNOSIS — D631 Anemia in chronic kidney disease: Secondary | ICD-10-CM | POA: Diagnosis not present

## 2024-01-27 DIAGNOSIS — R52 Pain, unspecified: Secondary | ICD-10-CM | POA: Diagnosis not present

## 2024-01-27 DIAGNOSIS — N2581 Secondary hyperparathyroidism of renal origin: Secondary | ICD-10-CM | POA: Diagnosis not present

## 2024-01-27 DIAGNOSIS — Z992 Dependence on renal dialysis: Secondary | ICD-10-CM | POA: Diagnosis not present

## 2024-01-27 DIAGNOSIS — I953 Hypotension of hemodialysis: Secondary | ICD-10-CM | POA: Diagnosis not present

## 2024-01-29 DIAGNOSIS — D509 Iron deficiency anemia, unspecified: Secondary | ICD-10-CM | POA: Diagnosis not present

## 2024-01-29 DIAGNOSIS — E1129 Type 2 diabetes mellitus with other diabetic kidney complication: Secondary | ICD-10-CM | POA: Diagnosis not present

## 2024-01-29 DIAGNOSIS — Z89519 Acquired absence of unspecified leg below knee: Secondary | ICD-10-CM | POA: Diagnosis not present

## 2024-01-29 DIAGNOSIS — E113411 Type 2 diabetes mellitus with severe nonproliferative diabetic retinopathy with macular edema, right eye: Secondary | ICD-10-CM | POA: Diagnosis not present

## 2024-01-29 DIAGNOSIS — S81001A Unspecified open wound, right knee, initial encounter: Secondary | ICD-10-CM | POA: Diagnosis not present

## 2024-01-29 DIAGNOSIS — N186 End stage renal disease: Secondary | ICD-10-CM | POA: Diagnosis not present

## 2024-01-29 DIAGNOSIS — R197 Diarrhea, unspecified: Secondary | ICD-10-CM | POA: Diagnosis not present

## 2024-01-29 DIAGNOSIS — D638 Anemia in other chronic diseases classified elsewhere: Secondary | ICD-10-CM | POA: Diagnosis not present

## 2024-01-29 DIAGNOSIS — R110 Nausea: Secondary | ICD-10-CM | POA: Diagnosis not present

## 2024-01-29 DIAGNOSIS — M6281 Muscle weakness (generalized): Secondary | ICD-10-CM | POA: Diagnosis not present

## 2024-01-29 DIAGNOSIS — L089 Local infection of the skin and subcutaneous tissue, unspecified: Secondary | ICD-10-CM | POA: Diagnosis not present

## 2024-01-29 DIAGNOSIS — Z992 Dependence on renal dialysis: Secondary | ICD-10-CM | POA: Diagnosis not present

## 2024-01-29 DIAGNOSIS — D631 Anemia in chronic kidney disease: Secondary | ICD-10-CM | POA: Diagnosis not present

## 2024-01-29 DIAGNOSIS — E11628 Type 2 diabetes mellitus with other skin complications: Secondary | ICD-10-CM | POA: Diagnosis not present

## 2024-01-29 DIAGNOSIS — I480 Paroxysmal atrial fibrillation: Secondary | ICD-10-CM | POA: Diagnosis not present

## 2024-01-29 DIAGNOSIS — R52 Pain, unspecified: Secondary | ICD-10-CM | POA: Diagnosis not present

## 2024-01-29 DIAGNOSIS — S81802A Unspecified open wound, left lower leg, initial encounter: Secondary | ICD-10-CM | POA: Diagnosis not present

## 2024-01-29 DIAGNOSIS — E43 Unspecified severe protein-calorie malnutrition: Secondary | ICD-10-CM | POA: Diagnosis not present

## 2024-01-29 DIAGNOSIS — M86172 Other acute osteomyelitis, left ankle and foot: Secondary | ICD-10-CM | POA: Diagnosis not present

## 2024-01-29 DIAGNOSIS — N2581 Secondary hyperparathyroidism of renal origin: Secondary | ICD-10-CM | POA: Diagnosis not present

## 2024-01-30 DIAGNOSIS — R531 Weakness: Secondary | ICD-10-CM | POA: Diagnosis not present

## 2024-01-30 DIAGNOSIS — Z992 Dependence on renal dialysis: Secondary | ICD-10-CM | POA: Diagnosis not present

## 2024-01-30 DIAGNOSIS — N186 End stage renal disease: Secondary | ICD-10-CM | POA: Diagnosis not present

## 2024-01-30 DIAGNOSIS — I959 Hypotension, unspecified: Secondary | ICD-10-CM | POA: Diagnosis not present

## 2024-01-31 DIAGNOSIS — R110 Nausea: Secondary | ICD-10-CM | POA: Diagnosis not present

## 2024-01-31 DIAGNOSIS — E1129 Type 2 diabetes mellitus with other diabetic kidney complication: Secondary | ICD-10-CM | POA: Diagnosis not present

## 2024-01-31 DIAGNOSIS — R52 Pain, unspecified: Secondary | ICD-10-CM | POA: Diagnosis not present

## 2024-01-31 DIAGNOSIS — D631 Anemia in chronic kidney disease: Secondary | ICD-10-CM | POA: Diagnosis not present

## 2024-01-31 DIAGNOSIS — D509 Iron deficiency anemia, unspecified: Secondary | ICD-10-CM | POA: Diagnosis not present

## 2024-01-31 DIAGNOSIS — Z992 Dependence on renal dialysis: Secondary | ICD-10-CM | POA: Diagnosis not present

## 2024-01-31 DIAGNOSIS — N2581 Secondary hyperparathyroidism of renal origin: Secondary | ICD-10-CM | POA: Diagnosis not present

## 2024-01-31 DIAGNOSIS — N186 End stage renal disease: Secondary | ICD-10-CM | POA: Diagnosis not present

## 2024-01-31 DIAGNOSIS — R197 Diarrhea, unspecified: Secondary | ICD-10-CM | POA: Diagnosis not present

## 2024-02-02 DIAGNOSIS — Z992 Dependence on renal dialysis: Secondary | ICD-10-CM | POA: Diagnosis not present

## 2024-02-02 DIAGNOSIS — R531 Weakness: Secondary | ICD-10-CM | POA: Diagnosis not present

## 2024-02-02 DIAGNOSIS — N186 End stage renal disease: Secondary | ICD-10-CM | POA: Diagnosis not present

## 2024-02-02 DIAGNOSIS — Z7189 Other specified counseling: Secondary | ICD-10-CM | POA: Diagnosis not present

## 2024-02-03 ENCOUNTER — Telehealth: Payer: Self-pay

## 2024-02-03 DIAGNOSIS — R110 Nausea: Secondary | ICD-10-CM | POA: Diagnosis not present

## 2024-02-03 DIAGNOSIS — R197 Diarrhea, unspecified: Secondary | ICD-10-CM | POA: Diagnosis not present

## 2024-02-03 DIAGNOSIS — N2581 Secondary hyperparathyroidism of renal origin: Secondary | ICD-10-CM | POA: Diagnosis not present

## 2024-02-03 DIAGNOSIS — D631 Anemia in chronic kidney disease: Secondary | ICD-10-CM | POA: Diagnosis not present

## 2024-02-03 DIAGNOSIS — R52 Pain, unspecified: Secondary | ICD-10-CM | POA: Diagnosis not present

## 2024-02-03 DIAGNOSIS — N186 End stage renal disease: Secondary | ICD-10-CM | POA: Diagnosis not present

## 2024-02-03 DIAGNOSIS — D509 Iron deficiency anemia, unspecified: Secondary | ICD-10-CM | POA: Diagnosis not present

## 2024-02-03 DIAGNOSIS — E1129 Type 2 diabetes mellitus with other diabetic kidney complication: Secondary | ICD-10-CM | POA: Diagnosis not present

## 2024-02-03 DIAGNOSIS — Z992 Dependence on renal dialysis: Secondary | ICD-10-CM | POA: Diagnosis not present

## 2024-02-03 NOTE — Telephone Encounter (Signed)
 Appt/Referral: -daughter called requesting a 2nd opinion about pt's hands turning colors -returned call to daughter and stated when he was in the hospital, she kept asking people to look at his hands but was unable to get any acknowledgment or testing.   -referral needed for upper arms/hands.  Daughter will contact primary care or facility provider at [facility]
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
## 2024-02-05 ENCOUNTER — Ambulatory Visit: Attending: Nurse Practitioner | Admitting: Nurse Practitioner

## 2024-02-05 DIAGNOSIS — Z4781 Encounter for orthopedic aftercare following surgical amputation: Secondary | ICD-10-CM | POA: Diagnosis not present

## 2024-02-05 DIAGNOSIS — E1129 Type 2 diabetes mellitus with other diabetic kidney complication: Secondary | ICD-10-CM | POA: Diagnosis not present

## 2024-02-05 DIAGNOSIS — R52 Pain, unspecified: Secondary | ICD-10-CM | POA: Diagnosis not present

## 2024-02-05 DIAGNOSIS — R110 Nausea: Secondary | ICD-10-CM | POA: Diagnosis not present

## 2024-02-05 DIAGNOSIS — I350 Nonrheumatic aortic (valve) stenosis: Secondary | ICD-10-CM

## 2024-02-05 DIAGNOSIS — N186 End stage renal disease: Secondary | ICD-10-CM | POA: Diagnosis not present

## 2024-02-05 DIAGNOSIS — I10 Essential (primary) hypertension: Secondary | ICD-10-CM

## 2024-02-05 DIAGNOSIS — Z992 Dependence on renal dialysis: Secondary | ICD-10-CM

## 2024-02-05 DIAGNOSIS — R197 Diarrhea, unspecified: Secondary | ICD-10-CM | POA: Diagnosis not present

## 2024-02-05 DIAGNOSIS — D631 Anemia in chronic kidney disease: Secondary | ICD-10-CM | POA: Diagnosis not present

## 2024-02-05 DIAGNOSIS — I2510 Atherosclerotic heart disease of native coronary artery without angina pectoris: Secondary | ICD-10-CM

## 2024-02-05 DIAGNOSIS — I739 Peripheral vascular disease, unspecified: Secondary | ICD-10-CM

## 2024-02-05 DIAGNOSIS — I480 Paroxysmal atrial fibrillation: Secondary | ICD-10-CM

## 2024-02-05 DIAGNOSIS — Z89511 Acquired absence of right leg below knee: Secondary | ICD-10-CM | POA: Diagnosis not present

## 2024-02-05 DIAGNOSIS — D509 Iron deficiency anemia, unspecified: Secondary | ICD-10-CM | POA: Diagnosis not present

## 2024-02-05 DIAGNOSIS — N2581 Secondary hyperparathyroidism of renal origin: Secondary | ICD-10-CM | POA: Diagnosis not present

## 2024-02-06 ENCOUNTER — Ambulatory Visit: Admitting: Orthopedic Surgery

## 2024-02-06 DIAGNOSIS — S81001D Unspecified open wound, right knee, subsequent encounter: Secondary | ICD-10-CM | POA: Diagnosis not present

## 2024-02-06 DIAGNOSIS — S81802A Unspecified open wound, left lower leg, initial encounter: Secondary | ICD-10-CM | POA: Diagnosis not present

## 2024-02-06 DIAGNOSIS — D638 Anemia in other chronic diseases classified elsewhere: Secondary | ICD-10-CM | POA: Diagnosis not present

## 2024-02-06 DIAGNOSIS — I480 Paroxysmal atrial fibrillation: Secondary | ICD-10-CM | POA: Diagnosis not present

## 2024-02-06 DIAGNOSIS — E113411 Type 2 diabetes mellitus with severe nonproliferative diabetic retinopathy with macular edema, right eye: Secondary | ICD-10-CM | POA: Diagnosis not present

## 2024-02-06 DIAGNOSIS — E43 Unspecified severe protein-calorie malnutrition: Secondary | ICD-10-CM | POA: Diagnosis not present

## 2024-02-06 DIAGNOSIS — S81001A Unspecified open wound, right knee, initial encounter: Secondary | ICD-10-CM | POA: Diagnosis not present

## 2024-02-06 DIAGNOSIS — M6281 Muscle weakness (generalized): Secondary | ICD-10-CM | POA: Diagnosis not present

## 2024-02-07 DIAGNOSIS — Z992 Dependence on renal dialysis: Secondary | ICD-10-CM | POA: Diagnosis not present

## 2024-02-07 DIAGNOSIS — D509 Iron deficiency anemia, unspecified: Secondary | ICD-10-CM | POA: Diagnosis not present

## 2024-02-07 DIAGNOSIS — E1129 Type 2 diabetes mellitus with other diabetic kidney complication: Secondary | ICD-10-CM | POA: Diagnosis not present

## 2024-02-07 DIAGNOSIS — N186 End stage renal disease: Secondary | ICD-10-CM | POA: Diagnosis not present

## 2024-02-07 DIAGNOSIS — R197 Diarrhea, unspecified: Secondary | ICD-10-CM | POA: Diagnosis not present

## 2024-02-07 DIAGNOSIS — R110 Nausea: Secondary | ICD-10-CM | POA: Diagnosis not present

## 2024-02-07 DIAGNOSIS — N2581 Secondary hyperparathyroidism of renal origin: Secondary | ICD-10-CM | POA: Diagnosis not present

## 2024-02-07 DIAGNOSIS — R52 Pain, unspecified: Secondary | ICD-10-CM | POA: Diagnosis not present

## 2024-02-07 DIAGNOSIS — D631 Anemia in chronic kidney disease: Secondary | ICD-10-CM | POA: Diagnosis not present

## 2024-02-10 DIAGNOSIS — R52 Pain, unspecified: Secondary | ICD-10-CM | POA: Diagnosis not present

## 2024-02-10 DIAGNOSIS — R110 Nausea: Secondary | ICD-10-CM | POA: Diagnosis not present

## 2024-02-10 DIAGNOSIS — E1129 Type 2 diabetes mellitus with other diabetic kidney complication: Secondary | ICD-10-CM | POA: Diagnosis not present

## 2024-02-10 DIAGNOSIS — N186 End stage renal disease: Secondary | ICD-10-CM | POA: Diagnosis not present

## 2024-02-10 DIAGNOSIS — D509 Iron deficiency anemia, unspecified: Secondary | ICD-10-CM | POA: Diagnosis not present

## 2024-02-10 DIAGNOSIS — Z992 Dependence on renal dialysis: Secondary | ICD-10-CM | POA: Diagnosis not present

## 2024-02-10 DIAGNOSIS — N2581 Secondary hyperparathyroidism of renal origin: Secondary | ICD-10-CM | POA: Diagnosis not present

## 2024-02-10 DIAGNOSIS — R197 Diarrhea, unspecified: Secondary | ICD-10-CM | POA: Diagnosis not present

## 2024-02-10 DIAGNOSIS — D631 Anemia in chronic kidney disease: Secondary | ICD-10-CM | POA: Diagnosis not present

## 2024-02-11 DIAGNOSIS — I96 Gangrene, not elsewhere classified: Secondary | ICD-10-CM | POA: Diagnosis not present

## 2024-02-11 DIAGNOSIS — I480 Paroxysmal atrial fibrillation: Secondary | ICD-10-CM | POA: Diagnosis not present

## 2024-02-11 DIAGNOSIS — S81802A Unspecified open wound, left lower leg, initial encounter: Secondary | ICD-10-CM | POA: Diagnosis not present

## 2024-02-11 DIAGNOSIS — D638 Anemia in other chronic diseases classified elsewhere: Secondary | ICD-10-CM | POA: Diagnosis not present

## 2024-02-11 DIAGNOSIS — M6281 Muscle weakness (generalized): Secondary | ICD-10-CM | POA: Diagnosis not present

## 2024-02-11 DIAGNOSIS — Z89512 Acquired absence of left leg below knee: Secondary | ICD-10-CM | POA: Diagnosis not present

## 2024-02-11 DIAGNOSIS — E113411 Type 2 diabetes mellitus with severe nonproliferative diabetic retinopathy with macular edema, right eye: Secondary | ICD-10-CM | POA: Diagnosis not present

## 2024-02-11 DIAGNOSIS — S81001A Unspecified open wound, right knee, initial encounter: Secondary | ICD-10-CM | POA: Diagnosis not present

## 2024-02-11 DIAGNOSIS — R2689 Other abnormalities of gait and mobility: Secondary | ICD-10-CM | POA: Diagnosis not present

## 2024-02-11 DIAGNOSIS — S81001D Unspecified open wound, right knee, subsequent encounter: Secondary | ICD-10-CM | POA: Diagnosis not present

## 2024-02-11 DIAGNOSIS — E43 Unspecified severe protein-calorie malnutrition: Secondary | ICD-10-CM | POA: Diagnosis not present

## 2024-02-12 DIAGNOSIS — R195 Other fecal abnormalities: Secondary | ICD-10-CM | POA: Diagnosis not present

## 2024-02-12 DIAGNOSIS — D509 Iron deficiency anemia, unspecified: Secondary | ICD-10-CM | POA: Diagnosis not present

## 2024-02-12 DIAGNOSIS — N2581 Secondary hyperparathyroidism of renal origin: Secondary | ICD-10-CM | POA: Diagnosis not present

## 2024-02-12 DIAGNOSIS — N186 End stage renal disease: Secondary | ICD-10-CM | POA: Diagnosis not present

## 2024-02-12 DIAGNOSIS — Z992 Dependence on renal dialysis: Secondary | ICD-10-CM | POA: Diagnosis not present

## 2024-02-12 DIAGNOSIS — Z79899 Other long term (current) drug therapy: Secondary | ICD-10-CM | POA: Diagnosis not present

## 2024-02-12 DIAGNOSIS — D631 Anemia in chronic kidney disease: Secondary | ICD-10-CM | POA: Diagnosis not present

## 2024-02-12 DIAGNOSIS — R531 Weakness: Secondary | ICD-10-CM | POA: Diagnosis not present

## 2024-02-12 DIAGNOSIS — R110 Nausea: Secondary | ICD-10-CM | POA: Diagnosis not present

## 2024-02-12 DIAGNOSIS — R52 Pain, unspecified: Secondary | ICD-10-CM | POA: Diagnosis not present

## 2024-02-12 DIAGNOSIS — Z4781 Encounter for orthopedic aftercare following surgical amputation: Secondary | ICD-10-CM | POA: Diagnosis not present

## 2024-02-12 DIAGNOSIS — R197 Diarrhea, unspecified: Secondary | ICD-10-CM | POA: Diagnosis not present

## 2024-02-12 DIAGNOSIS — E1129 Type 2 diabetes mellitus with other diabetic kidney complication: Secondary | ICD-10-CM | POA: Diagnosis not present

## 2024-02-13 ENCOUNTER — Other Ambulatory Visit (HOSPITAL_COMMUNITY): Payer: Self-pay

## 2024-02-14 DIAGNOSIS — N186 End stage renal disease: Secondary | ICD-10-CM | POA: Diagnosis not present

## 2024-02-14 DIAGNOSIS — D631 Anemia in chronic kidney disease: Secondary | ICD-10-CM | POA: Diagnosis not present

## 2024-02-14 DIAGNOSIS — R197 Diarrhea, unspecified: Secondary | ICD-10-CM | POA: Diagnosis not present

## 2024-02-14 DIAGNOSIS — D649 Anemia, unspecified: Secondary | ICD-10-CM | POA: Diagnosis not present

## 2024-02-14 DIAGNOSIS — Z992 Dependence on renal dialysis: Secondary | ICD-10-CM | POA: Diagnosis not present

## 2024-02-14 DIAGNOSIS — N2581 Secondary hyperparathyroidism of renal origin: Secondary | ICD-10-CM | POA: Diagnosis not present

## 2024-02-14 DIAGNOSIS — E1129 Type 2 diabetes mellitus with other diabetic kidney complication: Secondary | ICD-10-CM | POA: Diagnosis not present

## 2024-02-14 DIAGNOSIS — D696 Thrombocytopenia, unspecified: Secondary | ICD-10-CM | POA: Diagnosis not present

## 2024-02-14 DIAGNOSIS — R195 Other fecal abnormalities: Secondary | ICD-10-CM | POA: Diagnosis not present

## 2024-02-14 DIAGNOSIS — R110 Nausea: Secondary | ICD-10-CM | POA: Diagnosis not present

## 2024-02-14 DIAGNOSIS — R52 Pain, unspecified: Secondary | ICD-10-CM | POA: Diagnosis not present

## 2024-02-14 DIAGNOSIS — D509 Iron deficiency anemia, unspecified: Secondary | ICD-10-CM | POA: Diagnosis not present

## 2024-02-16 DIAGNOSIS — R531 Weakness: Secondary | ICD-10-CM | POA: Diagnosis not present

## 2024-02-16 DIAGNOSIS — R195 Other fecal abnormalities: Secondary | ICD-10-CM | POA: Diagnosis not present

## 2024-02-17 DIAGNOSIS — R197 Diarrhea, unspecified: Secondary | ICD-10-CM | POA: Diagnosis not present

## 2024-02-17 DIAGNOSIS — E1129 Type 2 diabetes mellitus with other diabetic kidney complication: Secondary | ICD-10-CM | POA: Diagnosis not present

## 2024-02-17 DIAGNOSIS — N2581 Secondary hyperparathyroidism of renal origin: Secondary | ICD-10-CM | POA: Diagnosis not present

## 2024-02-17 DIAGNOSIS — D509 Iron deficiency anemia, unspecified: Secondary | ICD-10-CM | POA: Diagnosis not present

## 2024-02-17 DIAGNOSIS — D631 Anemia in chronic kidney disease: Secondary | ICD-10-CM | POA: Diagnosis not present

## 2024-02-17 DIAGNOSIS — Z992 Dependence on renal dialysis: Secondary | ICD-10-CM | POA: Diagnosis not present

## 2024-02-17 DIAGNOSIS — R110 Nausea: Secondary | ICD-10-CM | POA: Diagnosis not present

## 2024-02-17 DIAGNOSIS — R52 Pain, unspecified: Secondary | ICD-10-CM | POA: Diagnosis not present

## 2024-02-17 DIAGNOSIS — N186 End stage renal disease: Secondary | ICD-10-CM | POA: Diagnosis not present

## 2024-02-18 DIAGNOSIS — D6869 Other thrombophilia: Secondary | ICD-10-CM | POA: Diagnosis not present

## 2024-02-18 DIAGNOSIS — Z8551 Personal history of malignant neoplasm of bladder: Secondary | ICD-10-CM | POA: Diagnosis not present

## 2024-02-18 DIAGNOSIS — I2510 Atherosclerotic heart disease of native coronary artery without angina pectoris: Secondary | ICD-10-CM | POA: Diagnosis not present

## 2024-02-18 DIAGNOSIS — Z89511 Acquired absence of right leg below knee: Secondary | ICD-10-CM | POA: Diagnosis not present

## 2024-02-18 DIAGNOSIS — N186 End stage renal disease: Secondary | ICD-10-CM | POA: Diagnosis not present

## 2024-02-18 DIAGNOSIS — Z0000 Encounter for general adult medical examination without abnormal findings: Secondary | ICD-10-CM | POA: Diagnosis not present

## 2024-02-18 DIAGNOSIS — E1169 Type 2 diabetes mellitus with other specified complication: Secondary | ICD-10-CM | POA: Diagnosis not present

## 2024-02-18 DIAGNOSIS — H35033 Hypertensive retinopathy, bilateral: Secondary | ICD-10-CM | POA: Diagnosis not present

## 2024-02-18 DIAGNOSIS — Z89512 Acquired absence of left leg below knee: Secondary | ICD-10-CM | POA: Diagnosis not present

## 2024-02-18 DIAGNOSIS — Z1159 Encounter for screening for other viral diseases: Secondary | ICD-10-CM | POA: Diagnosis not present

## 2024-02-18 DIAGNOSIS — E1140 Type 2 diabetes mellitus with diabetic neuropathy, unspecified: Secondary | ICD-10-CM | POA: Diagnosis not present

## 2024-02-19 DIAGNOSIS — D631 Anemia in chronic kidney disease: Secondary | ICD-10-CM | POA: Diagnosis not present

## 2024-02-19 DIAGNOSIS — Z4781 Encounter for orthopedic aftercare following surgical amputation: Secondary | ICD-10-CM | POA: Diagnosis not present

## 2024-02-19 DIAGNOSIS — I480 Paroxysmal atrial fibrillation: Secondary | ICD-10-CM | POA: Diagnosis not present

## 2024-02-19 DIAGNOSIS — R110 Nausea: Secondary | ICD-10-CM | POA: Diagnosis not present

## 2024-02-19 DIAGNOSIS — R197 Diarrhea, unspecified: Secondary | ICD-10-CM | POA: Diagnosis not present

## 2024-02-19 DIAGNOSIS — N186 End stage renal disease: Secondary | ICD-10-CM | POA: Diagnosis not present

## 2024-02-19 DIAGNOSIS — N2581 Secondary hyperparathyroidism of renal origin: Secondary | ICD-10-CM | POA: Diagnosis not present

## 2024-02-19 DIAGNOSIS — Z89511 Acquired absence of right leg below knee: Secondary | ICD-10-CM | POA: Diagnosis not present

## 2024-02-19 DIAGNOSIS — E1129 Type 2 diabetes mellitus with other diabetic kidney complication: Secondary | ICD-10-CM | POA: Diagnosis not present

## 2024-02-19 DIAGNOSIS — Z992 Dependence on renal dialysis: Secondary | ICD-10-CM | POA: Diagnosis not present

## 2024-02-19 DIAGNOSIS — D509 Iron deficiency anemia, unspecified: Secondary | ICD-10-CM | POA: Diagnosis not present

## 2024-02-19 DIAGNOSIS — R52 Pain, unspecified: Secondary | ICD-10-CM | POA: Diagnosis not present

## 2024-02-20 ENCOUNTER — Encounter: Payer: Self-pay | Admitting: Orthopedic Surgery

## 2024-02-20 ENCOUNTER — Ambulatory Visit: Admitting: Orthopedic Surgery

## 2024-02-20 DIAGNOSIS — Z89512 Acquired absence of left leg below knee: Secondary | ICD-10-CM

## 2024-02-20 DIAGNOSIS — Z89511 Acquired absence of right leg below knee: Secondary | ICD-10-CM
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
## 2024-02-21 DIAGNOSIS — Z992 Dependence on renal dialysis: Secondary | ICD-10-CM | POA: Diagnosis not present

## 2024-02-21 DIAGNOSIS — E1129 Type 2 diabetes mellitus with other diabetic kidney complication: Secondary | ICD-10-CM | POA: Diagnosis not present

## 2024-02-21 DIAGNOSIS — R110 Nausea: Secondary | ICD-10-CM | POA: Diagnosis not present

## 2024-02-21 DIAGNOSIS — R197 Diarrhea, unspecified: Secondary | ICD-10-CM | POA: Diagnosis not present

## 2024-02-21 DIAGNOSIS — R52 Pain, unspecified: Secondary | ICD-10-CM | POA: Diagnosis not present

## 2024-02-21 DIAGNOSIS — N186 End stage renal disease: Secondary | ICD-10-CM | POA: Diagnosis not present

## 2024-02-21 DIAGNOSIS — D509 Iron deficiency anemia, unspecified: Secondary | ICD-10-CM | POA: Diagnosis not present

## 2024-02-21 DIAGNOSIS — D631 Anemia in chronic kidney disease: Secondary | ICD-10-CM | POA: Diagnosis not present

## 2024-02-21 DIAGNOSIS — N2581 Secondary hyperparathyroidism of renal origin: Secondary | ICD-10-CM | POA: Diagnosis not present

## 2024-02-24 DIAGNOSIS — N186 End stage renal disease: Secondary | ICD-10-CM | POA: Diagnosis not present

## 2024-02-24 DIAGNOSIS — E1129 Type 2 diabetes mellitus with other diabetic kidney complication: Secondary | ICD-10-CM | POA: Diagnosis not present

## 2024-02-24 DIAGNOSIS — D631 Anemia in chronic kidney disease: Secondary | ICD-10-CM | POA: Diagnosis not present

## 2024-02-24 DIAGNOSIS — N2581 Secondary hyperparathyroidism of renal origin: Secondary | ICD-10-CM | POA: Diagnosis not present

## 2024-02-24 DIAGNOSIS — R110 Nausea: Secondary | ICD-10-CM | POA: Diagnosis not present

## 2024-02-24 DIAGNOSIS — R197 Diarrhea, unspecified: Secondary | ICD-10-CM | POA: Diagnosis not present

## 2024-02-24 DIAGNOSIS — Z992 Dependence on renal dialysis: Secondary | ICD-10-CM | POA: Diagnosis not present

## 2024-02-24 DIAGNOSIS — D509 Iron deficiency anemia, unspecified: Secondary | ICD-10-CM | POA: Diagnosis not present

## 2024-02-24 DIAGNOSIS — R52 Pain, unspecified: Secondary | ICD-10-CM | POA: Diagnosis not present

## 2024-02-25 DIAGNOSIS — D638 Anemia in other chronic diseases classified elsewhere: Secondary | ICD-10-CM | POA: Diagnosis not present

## 2024-02-25 DIAGNOSIS — S81802A Unspecified open wound, left lower leg, initial encounter: Secondary | ICD-10-CM | POA: Diagnosis not present

## 2024-02-25 DIAGNOSIS — I480 Paroxysmal atrial fibrillation: Secondary | ICD-10-CM | POA: Diagnosis not present

## 2024-02-25 DIAGNOSIS — E113411 Type 2 diabetes mellitus with severe nonproliferative diabetic retinopathy with macular edema, right eye: Secondary | ICD-10-CM | POA: Diagnosis not present

## 2024-02-25 DIAGNOSIS — S81001A Unspecified open wound, right knee, initial encounter: Secondary | ICD-10-CM | POA: Diagnosis not present

## 2024-02-25 DIAGNOSIS — S81001D Unspecified open wound, right knee, subsequent encounter: Secondary | ICD-10-CM | POA: Diagnosis not present

## 2024-02-25 DIAGNOSIS — M6281 Muscle weakness (generalized): Secondary | ICD-10-CM | POA: Diagnosis not present

## 2024-02-25 DIAGNOSIS — E43 Unspecified severe protein-calorie malnutrition: Secondary | ICD-10-CM | POA: Diagnosis not present

## 2024-02-26 DIAGNOSIS — N2581 Secondary hyperparathyroidism of renal origin: Secondary | ICD-10-CM | POA: Diagnosis not present

## 2024-02-26 DIAGNOSIS — R197 Diarrhea, unspecified: Secondary | ICD-10-CM | POA: Diagnosis not present

## 2024-02-26 DIAGNOSIS — E1129 Type 2 diabetes mellitus with other diabetic kidney complication: Secondary | ICD-10-CM | POA: Diagnosis not present

## 2024-02-26 DIAGNOSIS — Z992 Dependence on renal dialysis: Secondary | ICD-10-CM | POA: Diagnosis not present

## 2024-02-26 DIAGNOSIS — D509 Iron deficiency anemia, unspecified: Secondary | ICD-10-CM | POA: Diagnosis not present

## 2024-02-26 DIAGNOSIS — D631 Anemia in chronic kidney disease: Secondary | ICD-10-CM | POA: Diagnosis not present

## 2024-02-26 DIAGNOSIS — R110 Nausea: Secondary | ICD-10-CM | POA: Diagnosis not present

## 2024-02-26 DIAGNOSIS — N186 End stage renal disease: Secondary | ICD-10-CM | POA: Diagnosis not present

## 2024-02-26 DIAGNOSIS — R52 Pain, unspecified: Secondary | ICD-10-CM | POA: Diagnosis not present

## 2024-02-27 ENCOUNTER — Ambulatory Visit: Payer: Medicare Other | Admitting: Nurse Practitioner

## 2024-02-28 DIAGNOSIS — I953 Hypotension of hemodialysis: Secondary | ICD-10-CM | POA: Diagnosis not present

## 2024-02-28 DIAGNOSIS — N186 End stage renal disease: Secondary | ICD-10-CM | POA: Diagnosis not present

## 2024-02-28 DIAGNOSIS — D631 Anemia in chronic kidney disease: Secondary | ICD-10-CM | POA: Diagnosis not present

## 2024-02-28 DIAGNOSIS — E1129 Type 2 diabetes mellitus with other diabetic kidney complication: Secondary | ICD-10-CM | POA: Diagnosis not present

## 2024-02-28 DIAGNOSIS — Z992 Dependence on renal dialysis: Secondary | ICD-10-CM | POA: Diagnosis not present

## 2024-02-28 DIAGNOSIS — R197 Diarrhea, unspecified: Secondary | ICD-10-CM | POA: Diagnosis not present

## 2024-02-28 DIAGNOSIS — R110 Nausea: Secondary | ICD-10-CM | POA: Diagnosis not present

## 2024-02-28 DIAGNOSIS — N2581 Secondary hyperparathyroidism of renal origin: Secondary | ICD-10-CM | POA: Diagnosis not present

## 2024-02-28 DIAGNOSIS — D509 Iron deficiency anemia, unspecified: Secondary | ICD-10-CM | POA: Diagnosis not present

## 2024-02-29 DIAGNOSIS — R531 Weakness: Secondary | ICD-10-CM | POA: Diagnosis not present

## 2024-02-29 DIAGNOSIS — Z992 Dependence on renal dialysis: Secondary | ICD-10-CM | POA: Diagnosis not present

## 2024-02-29 DIAGNOSIS — Z7189 Other specified counseling: Secondary | ICD-10-CM | POA: Diagnosis not present

## 2024-02-29 DIAGNOSIS — N186 End stage renal disease: Secondary | ICD-10-CM | POA: Diagnosis not present

## 2024-03-02 DIAGNOSIS — E1129 Type 2 diabetes mellitus with other diabetic kidney complication: Secondary | ICD-10-CM | POA: Diagnosis not present

## 2024-03-02 DIAGNOSIS — D509 Iron deficiency anemia, unspecified: Secondary | ICD-10-CM | POA: Diagnosis not present

## 2024-03-02 DIAGNOSIS — R110 Nausea: Secondary | ICD-10-CM | POA: Diagnosis not present

## 2024-03-02 DIAGNOSIS — I953 Hypotension of hemodialysis: Secondary | ICD-10-CM | POA: Diagnosis not present

## 2024-03-02 DIAGNOSIS — R197 Diarrhea, unspecified: Secondary | ICD-10-CM | POA: Diagnosis not present

## 2024-03-02 DIAGNOSIS — N2581 Secondary hyperparathyroidism of renal origin: Secondary | ICD-10-CM | POA: Diagnosis not present

## 2024-03-02 DIAGNOSIS — D631 Anemia in chronic kidney disease: Secondary | ICD-10-CM | POA: Diagnosis not present

## 2024-03-02 DIAGNOSIS — Z992 Dependence on renal dialysis: Secondary | ICD-10-CM | POA: Diagnosis not present

## 2024-03-02 DIAGNOSIS — N186 End stage renal disease: Secondary | ICD-10-CM | POA: Diagnosis not present

## 2024-03-04 DIAGNOSIS — Z89511 Acquired absence of right leg below knee: Secondary | ICD-10-CM | POA: Diagnosis not present

## 2024-03-04 DIAGNOSIS — D631 Anemia in chronic kidney disease: Secondary | ICD-10-CM | POA: Diagnosis not present

## 2024-03-04 DIAGNOSIS — D509 Iron deficiency anemia, unspecified: Secondary | ICD-10-CM | POA: Diagnosis not present

## 2024-03-04 DIAGNOSIS — Z89512 Acquired absence of left leg below knee: Secondary | ICD-10-CM | POA: Diagnosis not present

## 2024-03-04 DIAGNOSIS — I953 Hypotension of hemodialysis: Secondary | ICD-10-CM | POA: Diagnosis not present

## 2024-03-04 DIAGNOSIS — Z7409 Other reduced mobility: Secondary | ICD-10-CM | POA: Diagnosis not present

## 2024-03-04 DIAGNOSIS — R197 Diarrhea, unspecified: Secondary | ICD-10-CM | POA: Diagnosis not present

## 2024-03-04 DIAGNOSIS — N186 End stage renal disease: Secondary | ICD-10-CM | POA: Diagnosis not present

## 2024-03-04 DIAGNOSIS — Z4781 Encounter for orthopedic aftercare following surgical amputation: Secondary | ICD-10-CM | POA: Diagnosis not present

## 2024-03-04 DIAGNOSIS — R531 Weakness: Secondary | ICD-10-CM | POA: Diagnosis not present

## 2024-03-04 DIAGNOSIS — Z992 Dependence on renal dialysis: Secondary | ICD-10-CM | POA: Diagnosis not present

## 2024-03-04 DIAGNOSIS — R110 Nausea: Secondary | ICD-10-CM | POA: Diagnosis not present

## 2024-03-04 DIAGNOSIS — E1129 Type 2 diabetes mellitus with other diabetic kidney complication: Secondary | ICD-10-CM | POA: Diagnosis not present

## 2024-03-04 DIAGNOSIS — N2581 Secondary hyperparathyroidism of renal origin: Secondary | ICD-10-CM | POA: Diagnosis not present

## 2024-03-06 DIAGNOSIS — D509 Iron deficiency anemia, unspecified: Secondary | ICD-10-CM | POA: Diagnosis not present

## 2024-03-06 DIAGNOSIS — N186 End stage renal disease: Secondary | ICD-10-CM | POA: Diagnosis not present

## 2024-03-06 DIAGNOSIS — D631 Anemia in chronic kidney disease: Secondary | ICD-10-CM | POA: Diagnosis not present

## 2024-03-06 DIAGNOSIS — Z992 Dependence on renal dialysis: Secondary | ICD-10-CM | POA: Diagnosis not present

## 2024-03-06 DIAGNOSIS — I953 Hypotension of hemodialysis: Secondary | ICD-10-CM | POA: Diagnosis not present

## 2024-03-06 DIAGNOSIS — R197 Diarrhea, unspecified: Secondary | ICD-10-CM | POA: Diagnosis not present

## 2024-03-06 DIAGNOSIS — N2581 Secondary hyperparathyroidism of renal origin: Secondary | ICD-10-CM | POA: Diagnosis not present

## 2024-03-06 DIAGNOSIS — E1129 Type 2 diabetes mellitus with other diabetic kidney complication: Secondary | ICD-10-CM | POA: Diagnosis not present

## 2024-03-06 DIAGNOSIS — R110 Nausea: Secondary | ICD-10-CM | POA: Diagnosis not present

## 2024-03-08 DIAGNOSIS — R531 Weakness: Secondary | ICD-10-CM | POA: Diagnosis not present

## 2024-03-08 DIAGNOSIS — Z89511 Acquired absence of right leg below knee: Secondary | ICD-10-CM | POA: Diagnosis not present

## 2024-03-08 DIAGNOSIS — N186 End stage renal disease: Secondary | ICD-10-CM | POA: Diagnosis not present

## 2024-03-08 DIAGNOSIS — G546 Phantom limb syndrome with pain: Secondary | ICD-10-CM | POA: Diagnosis not present

## 2024-03-08 DIAGNOSIS — Z7409 Other reduced mobility: Secondary | ICD-10-CM | POA: Diagnosis not present

## 2024-03-08 DIAGNOSIS — Z992 Dependence on renal dialysis: Secondary | ICD-10-CM | POA: Diagnosis not present

## 2024-03-08 DIAGNOSIS — Z89512 Acquired absence of left leg below knee: Secondary | ICD-10-CM | POA: Diagnosis not present

## 2024-03-09 DIAGNOSIS — R197 Diarrhea, unspecified: Secondary | ICD-10-CM | POA: Diagnosis not present

## 2024-03-09 DIAGNOSIS — Z992 Dependence on renal dialysis: Secondary | ICD-10-CM | POA: Diagnosis not present

## 2024-03-09 DIAGNOSIS — D509 Iron deficiency anemia, unspecified: Secondary | ICD-10-CM | POA: Diagnosis not present

## 2024-03-09 DIAGNOSIS — N2581 Secondary hyperparathyroidism of renal origin: Secondary | ICD-10-CM | POA: Diagnosis not present

## 2024-03-09 DIAGNOSIS — D631 Anemia in chronic kidney disease: Secondary | ICD-10-CM | POA: Diagnosis not present

## 2024-03-09 DIAGNOSIS — N186 End stage renal disease: Secondary | ICD-10-CM | POA: Diagnosis not present

## 2024-03-09 DIAGNOSIS — R110 Nausea: Secondary | ICD-10-CM | POA: Diagnosis not present

## 2024-03-09 DIAGNOSIS — E1129 Type 2 diabetes mellitus with other diabetic kidney complication: Secondary | ICD-10-CM | POA: Diagnosis not present

## 2024-03-09 DIAGNOSIS — I953 Hypotension of hemodialysis: Secondary | ICD-10-CM | POA: Diagnosis not present

## 2024-03-10 DIAGNOSIS — Z7409 Other reduced mobility: Secondary | ICD-10-CM | POA: Diagnosis not present

## 2024-03-10 DIAGNOSIS — R52 Pain, unspecified: Secondary | ICD-10-CM | POA: Diagnosis not present

## 2024-03-10 DIAGNOSIS — R531 Weakness: Secondary | ICD-10-CM | POA: Diagnosis not present

## 2024-03-10 DIAGNOSIS — G479 Sleep disorder, unspecified: Secondary | ICD-10-CM | POA: Diagnosis not present

## 2024-03-11 DIAGNOSIS — Z89511 Acquired absence of right leg below knee: Secondary | ICD-10-CM | POA: Diagnosis not present

## 2024-03-11 DIAGNOSIS — N186 End stage renal disease: Secondary | ICD-10-CM | POA: Diagnosis not present

## 2024-03-11 DIAGNOSIS — I480 Paroxysmal atrial fibrillation: Secondary | ICD-10-CM | POA: Diagnosis not present

## 2024-03-11 DIAGNOSIS — R197 Diarrhea, unspecified: Secondary | ICD-10-CM | POA: Diagnosis not present

## 2024-03-11 DIAGNOSIS — D631 Anemia in chronic kidney disease: Secondary | ICD-10-CM | POA: Diagnosis not present

## 2024-03-11 DIAGNOSIS — N2581 Secondary hyperparathyroidism of renal origin: Secondary | ICD-10-CM | POA: Diagnosis not present

## 2024-03-11 DIAGNOSIS — Z992 Dependence on renal dialysis: Secondary | ICD-10-CM | POA: Diagnosis not present

## 2024-03-11 DIAGNOSIS — D509 Iron deficiency anemia, unspecified: Secondary | ICD-10-CM | POA: Diagnosis not present

## 2024-03-11 DIAGNOSIS — E1129 Type 2 diabetes mellitus with other diabetic kidney complication: Secondary | ICD-10-CM | POA: Diagnosis not present

## 2024-03-11 DIAGNOSIS — I953 Hypotension of hemodialysis: Secondary | ICD-10-CM | POA: Diagnosis not present

## 2024-03-11 DIAGNOSIS — Z4781 Encounter for orthopedic aftercare following surgical amputation: Secondary | ICD-10-CM | POA: Diagnosis not present

## 2024-03-11 DIAGNOSIS — R110 Nausea: Secondary | ICD-10-CM | POA: Diagnosis not present

## 2024-03-13 DIAGNOSIS — Z992 Dependence on renal dialysis: Secondary | ICD-10-CM | POA: Diagnosis not present

## 2024-03-13 DIAGNOSIS — D509 Iron deficiency anemia, unspecified: Secondary | ICD-10-CM | POA: Diagnosis not present

## 2024-03-13 DIAGNOSIS — N186 End stage renal disease: Secondary | ICD-10-CM | POA: Diagnosis not present

## 2024-03-13 DIAGNOSIS — R110 Nausea: Secondary | ICD-10-CM | POA: Diagnosis not present

## 2024-03-13 DIAGNOSIS — I953 Hypotension of hemodialysis: Secondary | ICD-10-CM | POA: Diagnosis not present

## 2024-03-13 DIAGNOSIS — E1129 Type 2 diabetes mellitus with other diabetic kidney complication: Secondary | ICD-10-CM | POA: Diagnosis not present

## 2024-03-13 DIAGNOSIS — R197 Diarrhea, unspecified: Secondary | ICD-10-CM | POA: Diagnosis not present

## 2024-03-13 DIAGNOSIS — N2581 Secondary hyperparathyroidism of renal origin: Secondary | ICD-10-CM | POA: Diagnosis not present

## 2024-03-13 DIAGNOSIS — D631 Anemia in chronic kidney disease: Secondary | ICD-10-CM | POA: Diagnosis not present

## 2024-03-14 DIAGNOSIS — I1311 Hypertensive heart and chronic kidney disease without heart failure, with stage 5 chronic kidney disease, or end stage renal disease: Secondary | ICD-10-CM | POA: Diagnosis not present

## 2024-03-14 DIAGNOSIS — Z992 Dependence on renal dialysis: Secondary | ICD-10-CM | POA: Diagnosis not present

## 2024-03-14 DIAGNOSIS — R531 Weakness: Secondary | ICD-10-CM | POA: Diagnosis not present

## 2024-03-14 DIAGNOSIS — N186 End stage renal disease: Secondary | ICD-10-CM | POA: Diagnosis not present

## 2024-03-14 DIAGNOSIS — Z7409 Other reduced mobility: Secondary | ICD-10-CM | POA: Diagnosis not present

## 2024-03-16 DIAGNOSIS — R197 Diarrhea, unspecified: Secondary | ICD-10-CM | POA: Diagnosis not present

## 2024-03-16 DIAGNOSIS — R110 Nausea: Secondary | ICD-10-CM | POA: Diagnosis not present

## 2024-03-16 DIAGNOSIS — D509 Iron deficiency anemia, unspecified: Secondary | ICD-10-CM | POA: Diagnosis not present

## 2024-03-16 DIAGNOSIS — D631 Anemia in chronic kidney disease: Secondary | ICD-10-CM | POA: Diagnosis not present

## 2024-03-16 DIAGNOSIS — N2581 Secondary hyperparathyroidism of renal origin: Secondary | ICD-10-CM | POA: Diagnosis not present

## 2024-03-16 DIAGNOSIS — Z992 Dependence on renal dialysis: Secondary | ICD-10-CM | POA: Diagnosis not present

## 2024-03-16 DIAGNOSIS — I953 Hypotension of hemodialysis: Secondary | ICD-10-CM | POA: Diagnosis not present

## 2024-03-16 DIAGNOSIS — N186 End stage renal disease: Secondary | ICD-10-CM | POA: Diagnosis not present

## 2024-03-16 DIAGNOSIS — E1129 Type 2 diabetes mellitus with other diabetic kidney complication: Secondary | ICD-10-CM | POA: Diagnosis not present

## 2024-03-18 DIAGNOSIS — R110 Nausea: Secondary | ICD-10-CM | POA: Diagnosis not present

## 2024-03-18 DIAGNOSIS — Z992 Dependence on renal dialysis: Secondary | ICD-10-CM | POA: Diagnosis not present

## 2024-03-18 DIAGNOSIS — D631 Anemia in chronic kidney disease: Secondary | ICD-10-CM | POA: Diagnosis not present

## 2024-03-18 DIAGNOSIS — R197 Diarrhea, unspecified: Secondary | ICD-10-CM | POA: Diagnosis not present

## 2024-03-18 DIAGNOSIS — I953 Hypotension of hemodialysis: Secondary | ICD-10-CM | POA: Diagnosis not present

## 2024-03-18 DIAGNOSIS — E1129 Type 2 diabetes mellitus with other diabetic kidney complication: Secondary | ICD-10-CM | POA: Diagnosis not present

## 2024-03-18 DIAGNOSIS — N2581 Secondary hyperparathyroidism of renal origin: Secondary | ICD-10-CM | POA: Diagnosis not present

## 2024-03-18 DIAGNOSIS — D509 Iron deficiency anemia, unspecified: Secondary | ICD-10-CM | POA: Diagnosis not present

## 2024-03-18 DIAGNOSIS — N186 End stage renal disease: Secondary | ICD-10-CM | POA: Diagnosis not present

## 2024-03-19 ENCOUNTER — Ambulatory Visit (INDEPENDENT_AMBULATORY_CARE_PROVIDER_SITE_OTHER): Admitting: Orthopedic Surgery

## 2024-03-19 DIAGNOSIS — N186 End stage renal disease: Secondary | ICD-10-CM | POA: Diagnosis not present

## 2024-03-19 DIAGNOSIS — R300 Dysuria: Secondary | ICD-10-CM | POA: Diagnosis not present

## 2024-03-19 DIAGNOSIS — Z89512 Acquired absence of left leg below knee: Secondary | ICD-10-CM

## 2024-03-19 DIAGNOSIS — R52 Pain, unspecified: Secondary | ICD-10-CM | POA: Diagnosis not present

## 2024-03-19 DIAGNOSIS — I1311 Hypertensive heart and chronic kidney disease without heart failure, with stage 5 chronic kidney disease, or end stage renal disease: Secondary | ICD-10-CM | POA: Diagnosis not present

## 2024-03-19 DIAGNOSIS — Z89511 Acquired absence of right leg below knee: Secondary | ICD-10-CM

## 2024-03-19 DIAGNOSIS — I480 Paroxysmal atrial fibrillation: Secondary | ICD-10-CM | POA: Diagnosis not present

## 2024-03-20 DIAGNOSIS — Z992 Dependence on renal dialysis: Secondary | ICD-10-CM | POA: Diagnosis not present

## 2024-03-20 DIAGNOSIS — I953 Hypotension of hemodialysis: Secondary | ICD-10-CM | POA: Diagnosis not present

## 2024-03-20 DIAGNOSIS — D509 Iron deficiency anemia, unspecified: Secondary | ICD-10-CM | POA: Diagnosis not present

## 2024-03-20 DIAGNOSIS — D631 Anemia in chronic kidney disease: Secondary | ICD-10-CM | POA: Diagnosis not present

## 2024-03-20 DIAGNOSIS — N2581 Secondary hyperparathyroidism of renal origin: Secondary | ICD-10-CM | POA: Diagnosis not present

## 2024-03-20 DIAGNOSIS — E1129 Type 2 diabetes mellitus with other diabetic kidney complication: Secondary | ICD-10-CM | POA: Diagnosis not present

## 2024-03-20 DIAGNOSIS — R110 Nausea: Secondary | ICD-10-CM | POA: Diagnosis not present

## 2024-03-20 DIAGNOSIS — N186 End stage renal disease: Secondary | ICD-10-CM | POA: Diagnosis not present

## 2024-03-20 DIAGNOSIS — R197 Diarrhea, unspecified: Secondary | ICD-10-CM | POA: Diagnosis not present

## 2024-03-23 DIAGNOSIS — R110 Nausea: Secondary | ICD-10-CM | POA: Diagnosis not present

## 2024-03-23 DIAGNOSIS — N186 End stage renal disease: Secondary | ICD-10-CM | POA: Diagnosis not present

## 2024-03-23 DIAGNOSIS — D631 Anemia in chronic kidney disease: Secondary | ICD-10-CM | POA: Diagnosis not present

## 2024-03-23 DIAGNOSIS — I953 Hypotension of hemodialysis: Secondary | ICD-10-CM | POA: Diagnosis not present

## 2024-03-23 DIAGNOSIS — D509 Iron deficiency anemia, unspecified: Secondary | ICD-10-CM | POA: Diagnosis not present

## 2024-03-23 DIAGNOSIS — Z992 Dependence on renal dialysis: Secondary | ICD-10-CM | POA: Diagnosis not present

## 2024-03-23 DIAGNOSIS — N2581 Secondary hyperparathyroidism of renal origin: Secondary | ICD-10-CM | POA: Diagnosis not present

## 2024-03-23 DIAGNOSIS — R197 Diarrhea, unspecified: Secondary | ICD-10-CM | POA: Diagnosis not present

## 2024-03-23 DIAGNOSIS — E1129 Type 2 diabetes mellitus with other diabetic kidney complication: Secondary | ICD-10-CM | POA: Diagnosis not present

## 2024-03-24 ENCOUNTER — Encounter: Payer: Self-pay | Admitting: Orthopedic Surgery
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
## 2024-03-25 DIAGNOSIS — Z992 Dependence on renal dialysis: Secondary | ICD-10-CM | POA: Diagnosis not present

## 2024-03-25 DIAGNOSIS — Z89512 Acquired absence of left leg below knee: Secondary | ICD-10-CM | POA: Diagnosis not present

## 2024-03-25 DIAGNOSIS — R197 Diarrhea, unspecified: Secondary | ICD-10-CM | POA: Diagnosis not present

## 2024-03-25 DIAGNOSIS — I953 Hypotension of hemodialysis: Secondary | ICD-10-CM | POA: Diagnosis not present

## 2024-03-25 DIAGNOSIS — R531 Weakness: Secondary | ICD-10-CM | POA: Diagnosis not present

## 2024-03-25 DIAGNOSIS — R110 Nausea: Secondary | ICD-10-CM | POA: Diagnosis not present

## 2024-03-25 DIAGNOSIS — E11628 Type 2 diabetes mellitus with other skin complications: Secondary | ICD-10-CM | POA: Diagnosis not present

## 2024-03-25 DIAGNOSIS — N186 End stage renal disease: Secondary | ICD-10-CM | POA: Diagnosis not present

## 2024-03-25 DIAGNOSIS — D509 Iron deficiency anemia, unspecified: Secondary | ICD-10-CM | POA: Diagnosis not present

## 2024-03-25 DIAGNOSIS — D631 Anemia in chronic kidney disease: Secondary | ICD-10-CM | POA: Diagnosis not present

## 2024-03-25 DIAGNOSIS — E1129 Type 2 diabetes mellitus with other diabetic kidney complication: Secondary | ICD-10-CM | POA: Diagnosis not present

## 2024-03-25 DIAGNOSIS — N2581 Secondary hyperparathyroidism of renal origin: Secondary | ICD-10-CM | POA: Diagnosis not present

## 2024-03-25 DIAGNOSIS — I10 Essential (primary) hypertension: Secondary | ICD-10-CM | POA: Diagnosis not present

## 2024-03-26 ENCOUNTER — Ambulatory Visit: Admitting: Orthopedic Surgery

## 2024-03-26 DIAGNOSIS — Z4781 Encounter for orthopedic aftercare following surgical amputation: Secondary | ICD-10-CM | POA: Diagnosis not present

## 2024-03-26 DIAGNOSIS — Z89511 Acquired absence of right leg below knee: Secondary | ICD-10-CM | POA: Diagnosis not present

## 2024-03-26 DIAGNOSIS — N186 End stage renal disease: Secondary | ICD-10-CM | POA: Diagnosis not present

## 2024-03-26 DIAGNOSIS — I480 Paroxysmal atrial fibrillation: Secondary | ICD-10-CM | POA: Diagnosis not present

## 2024-03-27 DIAGNOSIS — Z992 Dependence on renal dialysis: Secondary | ICD-10-CM | POA: Diagnosis not present

## 2024-03-27 DIAGNOSIS — R110 Nausea: Secondary | ICD-10-CM | POA: Diagnosis not present

## 2024-03-27 DIAGNOSIS — N2581 Secondary hyperparathyroidism of renal origin: Secondary | ICD-10-CM | POA: Diagnosis not present

## 2024-03-27 DIAGNOSIS — N186 End stage renal disease: Secondary | ICD-10-CM | POA: Diagnosis not present

## 2024-03-27 DIAGNOSIS — D509 Iron deficiency anemia, unspecified: Secondary | ICD-10-CM | POA: Diagnosis not present

## 2024-03-27 DIAGNOSIS — D631 Anemia in chronic kidney disease: Secondary | ICD-10-CM | POA: Diagnosis not present

## 2024-03-27 DIAGNOSIS — E1129 Type 2 diabetes mellitus with other diabetic kidney complication: Secondary | ICD-10-CM | POA: Diagnosis not present

## 2024-03-27 DIAGNOSIS — I953 Hypotension of hemodialysis: Secondary | ICD-10-CM | POA: Diagnosis not present

## 2024-03-27 DIAGNOSIS — R197 Diarrhea, unspecified: Secondary | ICD-10-CM | POA: Diagnosis not present

## 2024-03-28 DIAGNOSIS — E1129 Type 2 diabetes mellitus with other diabetic kidney complication: Secondary | ICD-10-CM | POA: Diagnosis not present

## 2024-03-28 DIAGNOSIS — Z992 Dependence on renal dialysis: Secondary | ICD-10-CM | POA: Diagnosis not present

## 2024-03-28 DIAGNOSIS — N186 End stage renal disease: Secondary | ICD-10-CM | POA: Diagnosis not present

## 2024-03-30 DIAGNOSIS — R197 Diarrhea, unspecified: Secondary | ICD-10-CM | POA: Diagnosis not present

## 2024-03-30 DIAGNOSIS — D509 Iron deficiency anemia, unspecified: Secondary | ICD-10-CM | POA: Diagnosis not present

## 2024-03-30 DIAGNOSIS — D631 Anemia in chronic kidney disease: Secondary | ICD-10-CM | POA: Diagnosis not present

## 2024-03-30 DIAGNOSIS — Z992 Dependence on renal dialysis: Secondary | ICD-10-CM | POA: Diagnosis not present

## 2024-03-30 DIAGNOSIS — R110 Nausea: Secondary | ICD-10-CM | POA: Diagnosis not present

## 2024-03-30 DIAGNOSIS — N186 End stage renal disease: Secondary | ICD-10-CM | POA: Diagnosis not present

## 2024-03-30 DIAGNOSIS — E1129 Type 2 diabetes mellitus with other diabetic kidney complication: Secondary | ICD-10-CM | POA: Diagnosis not present

## 2024-03-30 DIAGNOSIS — N2581 Secondary hyperparathyroidism of renal origin: Secondary | ICD-10-CM | POA: Diagnosis not present

## 2024-04-01 DIAGNOSIS — Z992 Dependence on renal dialysis: Secondary | ICD-10-CM | POA: Diagnosis not present

## 2024-04-01 DIAGNOSIS — R197 Diarrhea, unspecified: Secondary | ICD-10-CM | POA: Diagnosis not present

## 2024-04-01 DIAGNOSIS — E1129 Type 2 diabetes mellitus with other diabetic kidney complication: Secondary | ICD-10-CM | POA: Diagnosis not present

## 2024-04-01 DIAGNOSIS — N186 End stage renal disease: Secondary | ICD-10-CM | POA: Diagnosis not present

## 2024-04-01 DIAGNOSIS — R110 Nausea: Secondary | ICD-10-CM | POA: Diagnosis not present

## 2024-04-01 DIAGNOSIS — N2581 Secondary hyperparathyroidism of renal origin: Secondary | ICD-10-CM | POA: Diagnosis not present

## 2024-04-01 DIAGNOSIS — D631 Anemia in chronic kidney disease: Secondary | ICD-10-CM | POA: Diagnosis not present

## 2024-04-01 DIAGNOSIS — D509 Iron deficiency anemia, unspecified: Secondary | ICD-10-CM | POA: Diagnosis not present

## 2024-04-02 DIAGNOSIS — Z992 Dependence on renal dialysis: Secondary | ICD-10-CM | POA: Diagnosis not present

## 2024-04-02 DIAGNOSIS — Z4781 Encounter for orthopedic aftercare following surgical amputation: Secondary | ICD-10-CM | POA: Diagnosis not present

## 2024-04-02 DIAGNOSIS — N186 End stage renal disease: Secondary | ICD-10-CM | POA: Diagnosis not present

## 2024-04-02 DIAGNOSIS — Z89511 Acquired absence of right leg below knee: Secondary | ICD-10-CM | POA: Diagnosis not present

## 2024-04-02 DIAGNOSIS — Z7409 Other reduced mobility: Secondary | ICD-10-CM | POA: Diagnosis not present

## 2024-04-02 DIAGNOSIS — R300 Dysuria: Secondary | ICD-10-CM | POA: Diagnosis not present

## 2024-04-02 DIAGNOSIS — I480 Paroxysmal atrial fibrillation: Secondary | ICD-10-CM | POA: Diagnosis not present

## 2024-04-02 DIAGNOSIS — R531 Weakness: Secondary | ICD-10-CM | POA: Diagnosis not present

## 2024-04-02 DIAGNOSIS — Z7189 Other specified counseling: Secondary | ICD-10-CM | POA: Diagnosis not present

## 2024-04-02 DIAGNOSIS — Z89512 Acquired absence of left leg below knee: Secondary | ICD-10-CM | POA: Diagnosis not present

## 2024-04-03 DIAGNOSIS — Z992 Dependence on renal dialysis: Secondary | ICD-10-CM | POA: Diagnosis not present

## 2024-04-03 DIAGNOSIS — D631 Anemia in chronic kidney disease: Secondary | ICD-10-CM | POA: Diagnosis not present

## 2024-04-03 DIAGNOSIS — R110 Nausea: Secondary | ICD-10-CM | POA: Diagnosis not present

## 2024-04-03 DIAGNOSIS — R197 Diarrhea, unspecified: Secondary | ICD-10-CM | POA: Diagnosis not present

## 2024-04-03 DIAGNOSIS — N186 End stage renal disease: Secondary | ICD-10-CM | POA: Diagnosis not present

## 2024-04-03 DIAGNOSIS — E1129 Type 2 diabetes mellitus with other diabetic kidney complication: Secondary | ICD-10-CM | POA: Diagnosis not present

## 2024-04-03 DIAGNOSIS — D509 Iron deficiency anemia, unspecified: Secondary | ICD-10-CM | POA: Diagnosis not present

## 2024-04-03 DIAGNOSIS — N2581 Secondary hyperparathyroidism of renal origin: Secondary | ICD-10-CM | POA: Diagnosis not present

## 2024-04-05 DIAGNOSIS — Z89512 Acquired absence of left leg below knee: Secondary | ICD-10-CM | POA: Diagnosis not present

## 2024-04-05 DIAGNOSIS — Z7189 Other specified counseling: Secondary | ICD-10-CM | POA: Diagnosis not present

## 2024-04-05 DIAGNOSIS — R5381 Other malaise: Secondary | ICD-10-CM | POA: Diagnosis not present

## 2024-04-05 DIAGNOSIS — Z992 Dependence on renal dialysis: Secondary | ICD-10-CM | POA: Diagnosis not present

## 2024-04-05 DIAGNOSIS — E46 Unspecified protein-calorie malnutrition: Secondary | ICD-10-CM | POA: Diagnosis not present

## 2024-04-05 DIAGNOSIS — N186 End stage renal disease: Secondary | ICD-10-CM | POA: Diagnosis not present

## 2024-04-05 DIAGNOSIS — Z7409 Other reduced mobility: Secondary | ICD-10-CM | POA: Diagnosis not present

## 2024-04-05 DIAGNOSIS — R531 Weakness: Secondary | ICD-10-CM | POA: Diagnosis not present

## 2024-04-06 DIAGNOSIS — R197 Diarrhea, unspecified: Secondary | ICD-10-CM | POA: Diagnosis not present

## 2024-04-06 DIAGNOSIS — Z992 Dependence on renal dialysis: Secondary | ICD-10-CM | POA: Diagnosis not present

## 2024-04-06 DIAGNOSIS — N2581 Secondary hyperparathyroidism of renal origin: Secondary | ICD-10-CM | POA: Diagnosis not present

## 2024-04-06 DIAGNOSIS — D631 Anemia in chronic kidney disease: Secondary | ICD-10-CM | POA: Diagnosis not present

## 2024-04-06 DIAGNOSIS — D509 Iron deficiency anemia, unspecified: Secondary | ICD-10-CM | POA: Diagnosis not present

## 2024-04-06 DIAGNOSIS — E1129 Type 2 diabetes mellitus with other diabetic kidney complication: Secondary | ICD-10-CM | POA: Diagnosis not present

## 2024-04-06 DIAGNOSIS — R110 Nausea: Secondary | ICD-10-CM | POA: Diagnosis not present

## 2024-04-06 DIAGNOSIS — N186 End stage renal disease: Secondary | ICD-10-CM | POA: Diagnosis not present

## 2024-04-08 DIAGNOSIS — R197 Diarrhea, unspecified: Secondary | ICD-10-CM | POA: Diagnosis not present

## 2024-04-08 DIAGNOSIS — Z992 Dependence on renal dialysis: Secondary | ICD-10-CM | POA: Diagnosis not present

## 2024-04-08 DIAGNOSIS — D509 Iron deficiency anemia, unspecified: Secondary | ICD-10-CM | POA: Diagnosis not present

## 2024-04-08 DIAGNOSIS — E1129 Type 2 diabetes mellitus with other diabetic kidney complication: Secondary | ICD-10-CM | POA: Diagnosis not present

## 2024-04-08 DIAGNOSIS — D631 Anemia in chronic kidney disease: Secondary | ICD-10-CM | POA: Diagnosis not present

## 2024-04-08 DIAGNOSIS — N186 End stage renal disease: Secondary | ICD-10-CM | POA: Diagnosis not present

## 2024-04-08 DIAGNOSIS — N2581 Secondary hyperparathyroidism of renal origin: Secondary | ICD-10-CM | POA: Diagnosis not present

## 2024-04-08 DIAGNOSIS — R110 Nausea: Secondary | ICD-10-CM | POA: Diagnosis not present

## 2024-04-09 ENCOUNTER — Encounter (HOSPITAL_COMMUNITY)
Admission: RE | Disposition: A | Discharge status: Home / Self Care | Payer: Self-pay | Source: Home / Self Care | Attending: Surgery

## 2024-04-09 ENCOUNTER — Other Ambulatory Visit: Payer: Self-pay

## 2024-04-09 ENCOUNTER — Inpatient Hospital Stay

## 2024-04-09 ENCOUNTER — Other Ambulatory Visit: Payer: Self-pay | Admitting: *Deleted

## 2024-04-09 ENCOUNTER — Encounter (HOSPITAL_COMMUNITY): Payer: Self-pay

## 2024-04-09 ENCOUNTER — Ambulatory Visit (HOSPITAL_COMMUNITY)
Admission: RE | Admit: 2024-04-09 | Discharge: 2024-04-09 | Disposition: A | Discharge status: Home / Self Care | Attending: Surgery | Admitting: Surgery

## 2024-04-09 ENCOUNTER — Inpatient Hospital Stay: Attending: Hematology and Oncology | Admitting: Hematology and Oncology

## 2024-04-09 VITALS — BP 154/76 | HR 67 | Temp 98.1°F | Resp 13

## 2024-04-09 DIAGNOSIS — N186 End stage renal disease: Secondary | ICD-10-CM | POA: Insufficient documentation

## 2024-04-09 DIAGNOSIS — E1151 Type 2 diabetes mellitus with diabetic peripheral angiopathy without gangrene: Secondary | ICD-10-CM | POA: Insufficient documentation

## 2024-04-09 DIAGNOSIS — D61818 Other pancytopenia: Secondary | ICD-10-CM | POA: Diagnosis not present

## 2024-04-09 DIAGNOSIS — Z992 Dependence on renal dialysis: Secondary | ICD-10-CM

## 2024-04-09 DIAGNOSIS — K219 Gastro-esophageal reflux disease without esophagitis: Secondary | ICD-10-CM

## 2024-04-09 DIAGNOSIS — D696 Thrombocytopenia, unspecified: Secondary | ICD-10-CM

## 2024-04-09 DIAGNOSIS — M1990 Unspecified osteoarthritis, unspecified site: Secondary | ICD-10-CM | POA: Diagnosis not present

## 2024-04-09 DIAGNOSIS — Z87891 Personal history of nicotine dependence: Secondary | ICD-10-CM | POA: Insufficient documentation

## 2024-04-09 DIAGNOSIS — E1122 Type 2 diabetes mellitus with diabetic chronic kidney disease: Secondary | ICD-10-CM | POA: Insufficient documentation

## 2024-04-09 DIAGNOSIS — I4819 Other persistent atrial fibrillation: Secondary | ICD-10-CM | POA: Insufficient documentation

## 2024-04-09 DIAGNOSIS — Z8349 Family history of other endocrine, nutritional and metabolic diseases: Secondary | ICD-10-CM | POA: Diagnosis not present

## 2024-04-09 DIAGNOSIS — I509 Heart failure, unspecified: Secondary | ICD-10-CM | POA: Insufficient documentation

## 2024-04-09 DIAGNOSIS — I252 Old myocardial infarction: Secondary | ICD-10-CM | POA: Insufficient documentation

## 2024-04-09 DIAGNOSIS — D649 Anemia, unspecified: Secondary | ICD-10-CM | POA: Diagnosis not present

## 2024-04-09 DIAGNOSIS — I132 Hypertensive heart and chronic kidney disease with heart failure and with stage 5 chronic kidney disease, or end stage renal disease: Secondary | ICD-10-CM | POA: Insufficient documentation

## 2024-04-09 DIAGNOSIS — Z89512 Acquired absence of left leg below knee: Secondary | ICD-10-CM | POA: Insufficient documentation

## 2024-04-09 DIAGNOSIS — Z800 Family history of malignant neoplasm of digestive organs: Secondary | ICD-10-CM | POA: Diagnosis not present

## 2024-04-09 DIAGNOSIS — Z79899 Other long term (current) drug therapy: Secondary | ICD-10-CM | POA: Insufficient documentation

## 2024-04-09 DIAGNOSIS — I2510 Atherosclerotic heart disease of native coronary artery without angina pectoris: Secondary | ICD-10-CM | POA: Insufficient documentation

## 2024-04-09 DIAGNOSIS — M797 Fibromyalgia: Secondary | ICD-10-CM | POA: Diagnosis not present

## 2024-04-09 DIAGNOSIS — R1110 Vomiting, unspecified: Secondary | ICD-10-CM | POA: Insufficient documentation

## 2024-04-09 DIAGNOSIS — Z8249 Family history of ischemic heart disease and other diseases of the circulatory system: Secondary | ICD-10-CM | POA: Diagnosis not present

## 2024-04-09 DIAGNOSIS — T82858A Stenosis of vascular prosthetic devices, implants and grafts, initial encounter: Secondary | ICD-10-CM | POA: Diagnosis not present

## 2024-04-09 DIAGNOSIS — E785 Hyperlipidemia, unspecified: Secondary | ICD-10-CM | POA: Diagnosis not present

## 2024-04-09 DIAGNOSIS — Z833 Family history of diabetes mellitus: Secondary | ICD-10-CM | POA: Diagnosis not present

## 2024-04-09 DIAGNOSIS — E7800 Pure hypercholesterolemia, unspecified: Secondary | ICD-10-CM | POA: Insufficient documentation

## 2024-04-09 DIAGNOSIS — Y832 Surgical operation with anastomosis, bypass or graft as the cause of abnormal reaction of the patient, or of later complication, without mention of misadventure at the time of the procedure: Secondary | ICD-10-CM | POA: Diagnosis not present

## 2024-04-09 DIAGNOSIS — I120 Hypertensive chronic kidney disease with stage 5 chronic kidney disease or end stage renal disease: Secondary | ICD-10-CM

## 2024-04-09 DIAGNOSIS — Z7901 Long term (current) use of anticoagulants: Secondary | ICD-10-CM | POA: Insufficient documentation

## 2024-04-09 DIAGNOSIS — Z89511 Acquired absence of right leg below knee: Secondary | ICD-10-CM | POA: Insufficient documentation

## 2024-04-09 DIAGNOSIS — Z809 Family history of malignant neoplasm, unspecified: Secondary | ICD-10-CM

## 2024-04-09 DIAGNOSIS — Z83438 Family history of other disorder of lipoprotein metabolism and other lipidemia: Secondary | ICD-10-CM

## 2024-04-09 HISTORY — PX: A/V SHUNT INTERVENTION: CATH118220

## 2024-04-09 HISTORY — PX: VENOUS ANGIOPLASTY: CATH118376

## 2024-04-09 LAB — CMP (CANCER CENTER ONLY)
ALT: 10 U/L (ref 0–44)
AST: 14 U/L — ABNORMAL LOW (ref 15–41)
Albumin: 3 g/dL — ABNORMAL LOW (ref 3.5–5.0)
Alkaline Phosphatase: 101 U/L (ref 38–126)
Anion gap: 16 — ABNORMAL HIGH (ref 5–15)
BUN: 21 mg/dL (ref 8–23)
CO2: 26 mmol/L (ref 22–32)
Calcium: 7.8 mg/dL — ABNORMAL LOW (ref 8.9–10.3)
Chloride: 94 mmol/L — ABNORMAL LOW (ref 98–111)
Creatinine: 4.22 mg/dL — ABNORMAL HIGH (ref 0.61–1.24)
GFR, Estimated: 14 mL/min — ABNORMAL LOW (ref >60)
Glucose, Bld: 119 mg/dL — ABNORMAL HIGH (ref 70–99)
Potassium: 3.4 mmol/L — ABNORMAL LOW (ref 3.5–5.1)
Sodium: 136 mmol/L (ref 135–145)
Total Bilirubin: 0.9 mg/dL (ref 0.0–1.2)
Total Protein: 7.2 g/dL (ref 6.5–8.1)

## 2024-04-09 LAB — IRON AND IRON BINDING CAPACITY (CC-WL,HP ONLY)
Iron: 58 ug/dL (ref 45–182)
Saturation Ratios: 28 % (ref 17.9–39.5)
TIBC: 206 ug/dL — ABNORMAL LOW (ref 250–450)
UIBC: 148 ug/dL

## 2024-04-09 LAB — FOLATE: Folate: 7.5 ng/mL (ref >5.9)

## 2024-04-09 LAB — LACTATE DEHYDROGENASE: LDH: 142 U/L (ref 98–192)

## 2024-04-09 LAB — VITAMIN B12: Vitamin B-12: 510 pg/mL (ref 180–914)

## 2024-04-09 SURGERY — A/V SHUNT INTERVENTION
Anesthesia: LOCAL

## 2024-04-09 MED ORDER — HEPARIN (PORCINE) IN NACL 1000-0.9 UT/500ML-% IV SOLN
INTRAVENOUS | Status: DC | PRN
  Administered 2024-04-09: 500 mL

## 2024-04-09 MED ORDER — LIDOCAINE HCL (PF) 1 % IJ SOLN
INTRAMUSCULAR | Status: AC
  Filled 2024-04-09: qty 30

## 2024-04-09 MED ORDER — LIDOCAINE HCL (PF) 1 % IJ SOLN
INTRAMUSCULAR | Status: DC | PRN
  Administered 2024-04-09: 2 mL via SUBCUTANEOUS

## 2024-04-09 MED ORDER — IODIXANOL 320 MG/ML IV SOLN
INTRAVENOUS | Status: DC | PRN
  Administered 2024-04-09: 15 mL via INTRAVENOUS

## 2024-04-09 SURGICAL SUPPLY — 10 items
BALLOON MUSTANG 9X80X75 (BALLOONS) IMPLANT
GUIDEWIRE ANGLED .035X150CM (WIRE) IMPLANT
KIT ENCORE 26 ADVANTAGE (KITS) IMPLANT
KIT MICROPUNCTURE NIT STIFF (SHEATH) IMPLANT
SHEATH PINNACLE R/O II 6F 4CM (SHEATH) IMPLANT
SHEATH PROBE COVER 6X72 (BAG) IMPLANT
STOPCOCK MORSE 400PSI 3WAY (MISCELLANEOUS) IMPLANT
TRAY PV CATH (CUSTOM PROCEDURE TRAY) ×2 IMPLANT
TUBING CIL FLEX 10 FLL-RA (TUBING) IMPLANT
WIRE BENTSON .035X145CM (WIRE) IMPLANT
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
## 2024-04-09 NOTE — Op Note (Signed)
    Patient name: William Webb  MRN: [id number] DOB: May 19, 1951 Sex: male  04/09/2024 Pre-operative Diagnosis: ESRD Post-operative diagnosis:  Same Surgeon:  [name] Procedure Performed:  1.  Ultrasound-guided access, left arm fistula  2.  Fistulogram  3.  Peripheral venoplasty    Indications: This is a 73 year old gentleman with end-stage renal disease who is having bleeding after access.  He comes in today for further evaluation possible intervention  Procedure:  The patient was identified in the holding area and taken to room 8.  The patient was then placed supine on the table and prepped and draped in the usual sterile fashion.  A time out was called.  Ultrasound was used to evaluate the fistula.  The vein was patent and compressible.  A digital ultrasound image was acquired.  The fistula was then accessed under ultrasound guidance using a micropuncture needle.  An 018 wire was then asvanced without resistance and a micropuncture sheath was placed.  Contrast injections were then performed through the sheath.  Findings: No evidence of central venous stenosis.  The arteriovenous anastomosis is widely patent.  There is a valve in the midportion of the fistula at that has an associated 70% stenosis.   Intervention: After the above images were acquired the decision was made to proceed with intervention.  Over a Bentson wire, a 6 French sheath was placed.  I selected a 9 x 80 balloon to perform balloon venoplasty of the basilic vein up to the axillary vein.  2 separate inflations were performed.  Completion imaging revealed resolution of the stenosis.  Balloon and wire were removed.  The sheath was removed with a Monocryl.  There were no complications  Impression:  #1  Successful balloon venoplasty of a 70% basilic vein stenosis with resolution of the stenosis.  #2  Fistula remains amenable to percutaneous intervention   V. [name], M.D., [facility]  [facility] of [place] Office: [phone] Pager:  [phone]

## 2024-04-10 ENCOUNTER — Encounter (HOSPITAL_COMMUNITY): Payer: Self-pay | Admitting: Surgery

## 2024-04-10 DIAGNOSIS — R197 Diarrhea, unspecified: Secondary | ICD-10-CM | POA: Diagnosis not present

## 2024-04-10 DIAGNOSIS — N2581 Secondary hyperparathyroidism of renal origin: Secondary | ICD-10-CM | POA: Diagnosis not present

## 2024-04-10 DIAGNOSIS — D509 Iron deficiency anemia, unspecified: Secondary | ICD-10-CM | POA: Diagnosis not present

## 2024-04-10 DIAGNOSIS — E1129 Type 2 diabetes mellitus with other diabetic kidney complication: Secondary | ICD-10-CM | POA: Diagnosis not present

## 2024-04-10 DIAGNOSIS — Z992 Dependence on renal dialysis: Secondary | ICD-10-CM | POA: Diagnosis not present

## 2024-04-10 DIAGNOSIS — R110 Nausea: Secondary | ICD-10-CM | POA: Diagnosis not present

## 2024-04-10 DIAGNOSIS — N186 End stage renal disease: Secondary | ICD-10-CM | POA: Diagnosis not present

## 2024-04-10 DIAGNOSIS — D631 Anemia in chronic kidney disease: Secondary | ICD-10-CM | POA: Diagnosis not present

## 2024-04-10 LAB — FERRITIN: Ferritin: 1556 ng/mL — ABNORMAL HIGH (ref 24–336)

## 2024-04-10 LAB — KAPPA/LAMBDA LIGHT CHAINS
Kappa free light chain: 1701.6 mg/L — ABNORMAL HIGH (ref 3.3–19.4)
Kappa, lambda light chain ratio: 8.84 — ABNORMAL HIGH (ref 0.26–1.65)
Lambda free light chains: 192.5 mg/L — ABNORMAL HIGH (ref 5.7–26.3)

## 2024-04-11 DIAGNOSIS — Z4789 Encounter for other orthopedic aftercare: Secondary | ICD-10-CM | POA: Diagnosis not present

## 2024-04-11 DIAGNOSIS — Z89512 Acquired absence of left leg below knee: Secondary | ICD-10-CM | POA: Diagnosis not present

## 2024-04-11 DIAGNOSIS — R531 Weakness: Secondary | ICD-10-CM | POA: Diagnosis not present

## 2024-04-11 DIAGNOSIS — Z992 Dependence on renal dialysis: Secondary | ICD-10-CM | POA: Diagnosis not present

## 2024-04-11 DIAGNOSIS — N186 End stage renal disease: Secondary | ICD-10-CM | POA: Diagnosis not present

## 2024-04-11 DIAGNOSIS — Z7409 Other reduced mobility: Secondary | ICD-10-CM | POA: Diagnosis not present

## 2024-04-11 DIAGNOSIS — Z89511 Acquired absence of right leg below knee: Secondary | ICD-10-CM | POA: Diagnosis not present

## 2024-04-11 LAB — ERYTHROPOIETIN: Erythropoietin: 293.4 m[IU]/mL — ABNORMAL HIGH (ref 2.6–18.5)

## 2024-04-12 DIAGNOSIS — Z89512 Acquired absence of left leg below knee: Secondary | ICD-10-CM | POA: Diagnosis not present

## 2024-04-12 DIAGNOSIS — R2689 Other abnormalities of gait and mobility: Secondary | ICD-10-CM | POA: Diagnosis not present

## 2024-04-12 DIAGNOSIS — M6281 Muscle weakness (generalized): Secondary | ICD-10-CM | POA: Diagnosis not present

## 2024-04-12 DIAGNOSIS — I96 Gangrene, not elsewhere classified: Secondary | ICD-10-CM | POA: Diagnosis not present

## 2024-04-13 DIAGNOSIS — R197 Diarrhea, unspecified: Secondary | ICD-10-CM | POA: Diagnosis not present

## 2024-04-13 DIAGNOSIS — D631 Anemia in chronic kidney disease: Secondary | ICD-10-CM | POA: Diagnosis not present

## 2024-04-13 DIAGNOSIS — Z992 Dependence on renal dialysis: Secondary | ICD-10-CM | POA: Diagnosis not present

## 2024-04-13 DIAGNOSIS — E1129 Type 2 diabetes mellitus with other diabetic kidney complication: Secondary | ICD-10-CM | POA: Diagnosis not present

## 2024-04-13 DIAGNOSIS — N186 End stage renal disease: Secondary | ICD-10-CM | POA: Diagnosis not present

## 2024-04-13 DIAGNOSIS — D509 Iron deficiency anemia, unspecified: Secondary | ICD-10-CM | POA: Diagnosis not present

## 2024-04-13 DIAGNOSIS — R110 Nausea: Secondary | ICD-10-CM | POA: Diagnosis not present

## 2024-04-13 DIAGNOSIS — N2581 Secondary hyperparathyroidism of renal origin: Secondary | ICD-10-CM | POA: Diagnosis not present

## 2024-04-14 ENCOUNTER — Inpatient Hospital Stay

## 2024-04-14 DIAGNOSIS — Z7901 Long term (current) use of anticoagulants: Secondary | ICD-10-CM | POA: Diagnosis not present

## 2024-04-14 DIAGNOSIS — R531 Weakness: Secondary | ICD-10-CM | POA: Diagnosis not present

## 2024-04-14 DIAGNOSIS — D649 Anemia, unspecified: Secondary | ICD-10-CM

## 2024-04-14 DIAGNOSIS — Z992 Dependence on renal dialysis: Secondary | ICD-10-CM | POA: Diagnosis not present

## 2024-04-14 DIAGNOSIS — Z89511 Acquired absence of right leg below knee: Secondary | ICD-10-CM | POA: Diagnosis not present

## 2024-04-14 DIAGNOSIS — M797 Fibromyalgia: Secondary | ICD-10-CM | POA: Diagnosis not present

## 2024-04-14 DIAGNOSIS — I509 Heart failure, unspecified: Secondary | ICD-10-CM | POA: Diagnosis not present

## 2024-04-14 DIAGNOSIS — Z8349 Family history of other endocrine, nutritional and metabolic diseases: Secondary | ICD-10-CM | POA: Diagnosis not present

## 2024-04-14 DIAGNOSIS — I2510 Atherosclerotic heart disease of native coronary artery without angina pectoris: Secondary | ICD-10-CM | POA: Diagnosis not present

## 2024-04-14 DIAGNOSIS — Z87891 Personal history of nicotine dependence: Secondary | ICD-10-CM | POA: Diagnosis not present

## 2024-04-14 DIAGNOSIS — E785 Hyperlipidemia, unspecified: Secondary | ICD-10-CM | POA: Diagnosis not present

## 2024-04-14 DIAGNOSIS — Z8249 Family history of ischemic heart disease and other diseases of the circulatory system: Secondary | ICD-10-CM | POA: Diagnosis not present

## 2024-04-14 DIAGNOSIS — Z800 Family history of malignant neoplasm of digestive organs: Secondary | ICD-10-CM | POA: Diagnosis not present

## 2024-04-14 DIAGNOSIS — M1990 Unspecified osteoarthritis, unspecified site: Secondary | ICD-10-CM | POA: Diagnosis not present

## 2024-04-14 DIAGNOSIS — E1122 Type 2 diabetes mellitus with diabetic chronic kidney disease: Secondary | ICD-10-CM | POA: Diagnosis not present

## 2024-04-14 DIAGNOSIS — D61818 Other pancytopenia: Secondary | ICD-10-CM | POA: Diagnosis not present

## 2024-04-14 DIAGNOSIS — Z79899 Other long term (current) drug therapy: Secondary | ICD-10-CM | POA: Diagnosis not present

## 2024-04-14 DIAGNOSIS — I252 Old myocardial infarction: Secondary | ICD-10-CM | POA: Diagnosis not present

## 2024-04-14 DIAGNOSIS — Z833 Family history of diabetes mellitus: Secondary | ICD-10-CM | POA: Diagnosis not present

## 2024-04-14 DIAGNOSIS — I132 Hypertensive heart and chronic kidney disease with heart failure and with stage 5 chronic kidney disease, or end stage renal disease: Secondary | ICD-10-CM | POA: Diagnosis not present

## 2024-04-14 DIAGNOSIS — R1110 Vomiting, unspecified: Secondary | ICD-10-CM | POA: Diagnosis not present

## 2024-04-14 DIAGNOSIS — N186 End stage renal disease: Secondary | ICD-10-CM | POA: Diagnosis not present

## 2024-04-14 DIAGNOSIS — I4819 Other persistent atrial fibrillation: Secondary | ICD-10-CM | POA: Diagnosis not present

## 2024-04-14 DIAGNOSIS — Z7409 Other reduced mobility: Secondary | ICD-10-CM | POA: Diagnosis not present

## 2024-04-14 DIAGNOSIS — Z89512 Acquired absence of left leg below knee: Secondary | ICD-10-CM | POA: Diagnosis not present

## 2024-04-14 LAB — CBC WITH DIFFERENTIAL (CANCER CENTER ONLY)
Abs Immature Granulocytes: 0.01 10*3/uL (ref 0.00–0.07)
Basophils Absolute: 0 10*3/uL (ref 0.0–0.1)
Basophils Relative: 1 %
Eosinophils Absolute: 0.3 10*3/uL (ref 0.0–0.5)
Eosinophils Relative: 8 %
HCT: 28.9 % — ABNORMAL LOW (ref 39.0–52.0)
Hemoglobin: 9.1 g/dL — ABNORMAL LOW (ref 13.0–17.0)
Immature Granulocytes: 0 %
Lymphocytes Relative: 19 %
Lymphs Abs: 0.7 10*3/uL (ref 0.7–4.0)
MCH: 27.7 pg (ref 26.0–34.0)
MCHC: 31.5 g/dL (ref 30.0–36.0)
MCV: 88.1 fL (ref 80.0–100.0)
Monocytes Absolute: 0.3 10*3/uL (ref 0.1–1.0)
Monocytes Relative: 7 %
Neutro Abs: 2.5 10*3/uL (ref 1.7–7.7)
Neutrophils Relative %: 65 %
Platelet Count: 82 10*3/uL — ABNORMAL LOW (ref 150–400)
RBC: 3.28 MIL/uL — ABNORMAL LOW (ref 4.22–5.81)
RDW: 15 % (ref 11.5–15.5)
Smear Review: NORMAL
WBC Count: 3.9 10*3/uL — ABNORMAL LOW (ref 4.0–10.5)
nRBC: 0 % (ref 0.0–0.2)

## 2024-04-14 LAB — RETIC PANEL
Immature Retic Fract: 12.8 % (ref 2.3–15.9)
RBC.: 3.18 MIL/uL — ABNORMAL LOW (ref 4.22–5.81)
Retic Count, Absolute: 41 10*3/uL (ref 19.0–186.0)
Retic Ct Pct: 1.3 % (ref 0.4–3.1)
Reticulocyte Hemoglobin: 25.8 pg — ABNORMAL LOW (ref >27.9)

## 2024-04-14 LAB — METHYLMALONIC ACID, SERUM: Methylmalonic Acid, Quantitative: 836 nmol/L — ABNORMAL HIGH (ref 0–378)

## 2024-04-15 ENCOUNTER — Ambulatory Visit: Payer: Self-pay | Admitting: *Deleted

## 2024-04-15 ENCOUNTER — Other Ambulatory Visit: Payer: Self-pay | Admitting: Hematology and Oncology

## 2024-04-15 DIAGNOSIS — N186 End stage renal disease: Secondary | ICD-10-CM | POA: Diagnosis not present

## 2024-04-15 DIAGNOSIS — Z992 Dependence on renal dialysis: Secondary | ICD-10-CM | POA: Diagnosis not present

## 2024-04-15 DIAGNOSIS — D509 Iron deficiency anemia, unspecified: Secondary | ICD-10-CM | POA: Diagnosis not present

## 2024-04-15 DIAGNOSIS — D631 Anemia in chronic kidney disease: Secondary | ICD-10-CM | POA: Diagnosis not present

## 2024-04-15 DIAGNOSIS — E1129 Type 2 diabetes mellitus with other diabetic kidney complication: Secondary | ICD-10-CM | POA: Diagnosis not present

## 2024-04-15 DIAGNOSIS — D472 Monoclonal gammopathy: Secondary | ICD-10-CM

## 2024-04-15 DIAGNOSIS — D649 Anemia, unspecified: Secondary | ICD-10-CM

## 2024-04-15 DIAGNOSIS — N2581 Secondary hyperparathyroidism of renal origin: Secondary | ICD-10-CM | POA: Diagnosis not present

## 2024-04-15 DIAGNOSIS — R110 Nausea: Secondary | ICD-10-CM | POA: Diagnosis not present

## 2024-04-15 DIAGNOSIS — R197 Diarrhea, unspecified: Secondary | ICD-10-CM | POA: Diagnosis not present

## 2024-04-15 LAB — KAPPA/LAMBDA LIGHT CHAINS
Kappa free light chain: 1691.6 mg/L — ABNORMAL HIGH (ref 3.3–19.4)
Kappa, lambda light chain ratio: 7.97 — ABNORMAL HIGH (ref 0.26–1.65)
Lambda free light chains: 212.3 mg/L — ABNORMAL HIGH (ref 5.7–26.3)

## 2024-04-15 NOTE — Telephone Encounter (Signed)
-----   Message from Rogerio Clay IV sent at 04/12/2024  7:01 PM EDT ----- Please let William Webb  know that his lab results show increased levels of abnormal protein in his blood.  In order to evaluate his anemia and determine the source of his protein we will need to  perform a bone marrow biopsy.  If he is agreeable we will schedule this as soon as is feasible. ----- Message ----- From: Interface, Lab In [place] Sent: 04/09/2024   6:17 PM EDT To: Ander Bame, MD

## 2024-04-15 NOTE — Telephone Encounter (Signed)
 TCT patient regarding recent lab results. Pt resides at [facility]. Spoke with his nurse and advised  that his lab results show increased levels of abnormal protein in his blood.  In order to evaluate his anemia and determine the source of his protein we will need to  perform a bone marrow biopsy. Advised that I would be calling his nephew with these results as well. TCT pt's nephew , Hydrologist. Spoke with him. He is agreeable for his uncle to have the bone marrow biopsy. He needs to be the one called to schedule please.

## 2024-04-16 ENCOUNTER — Other Ambulatory Visit: Payer: Self-pay

## 2024-04-17 ENCOUNTER — Other Ambulatory Visit: Payer: Self-pay | Admitting: *Deleted

## 2024-04-17 DIAGNOSIS — N186 End stage renal disease: Secondary | ICD-10-CM | POA: Diagnosis not present

## 2024-04-17 DIAGNOSIS — D509 Iron deficiency anemia, unspecified: Secondary | ICD-10-CM | POA: Diagnosis not present

## 2024-04-17 DIAGNOSIS — I739 Peripheral vascular disease, unspecified: Secondary | ICD-10-CM

## 2024-04-17 DIAGNOSIS — E1129 Type 2 diabetes mellitus with other diabetic kidney complication: Secondary | ICD-10-CM | POA: Diagnosis not present

## 2024-04-17 DIAGNOSIS — R197 Diarrhea, unspecified: Secondary | ICD-10-CM | POA: Diagnosis not present

## 2024-04-17 DIAGNOSIS — N2581 Secondary hyperparathyroidism of renal origin: Secondary | ICD-10-CM | POA: Diagnosis not present

## 2024-04-17 DIAGNOSIS — Z992 Dependence on renal dialysis: Secondary | ICD-10-CM | POA: Diagnosis not present

## 2024-04-17 DIAGNOSIS — D631 Anemia in chronic kidney disease: Secondary | ICD-10-CM | POA: Diagnosis not present

## 2024-04-17 DIAGNOSIS — R110 Nausea: Secondary | ICD-10-CM | POA: Diagnosis not present

## 2024-04-17 LAB — MULTIPLE MYELOMA PANEL, SERUM
Albumin SerPl Elph-Mcnc: 3.8 g/dL (ref 2.9–4.4)
Albumin/Glob SerPl: 1.1 (ref 0.7–1.7)
Alpha 1: 0.2 g/dL (ref 0.0–0.4)
Alpha2 Glob SerPl Elph-Mcnc: 0.6 g/dL (ref 0.4–1.0)
B-Globulin SerPl Elph-Mcnc: 0.9 g/dL (ref 0.7–1.3)
Gamma Glob SerPl Elph-Mcnc: 1.8 g/dL (ref 0.4–1.8)
Globulin, Total: 3.5 g/dL (ref 2.2–3.9)
IgA: 363 mg/dL (ref 61–437)
IgG (Immunoglobin G), Serum: 1984 mg/dL — ABNORMAL HIGH (ref 603–1613)
IgM (Immunoglobulin M), Srm: 67 mg/dL (ref 15–143)
Total Protein ELP: 7.3 g/dL (ref 6.0–8.5)

## 2024-04-19 DIAGNOSIS — N186 End stage renal disease: Secondary | ICD-10-CM | POA: Diagnosis not present

## 2024-04-19 DIAGNOSIS — Z89512 Acquired absence of left leg below knee: Secondary | ICD-10-CM | POA: Diagnosis not present

## 2024-04-19 DIAGNOSIS — R531 Weakness: Secondary | ICD-10-CM | POA: Diagnosis not present

## 2024-04-19 DIAGNOSIS — Z992 Dependence on renal dialysis: Secondary | ICD-10-CM | POA: Diagnosis not present

## 2024-04-19 DIAGNOSIS — Z7409 Other reduced mobility: Secondary | ICD-10-CM | POA: Diagnosis not present

## 2024-04-19 DIAGNOSIS — Z89511 Acquired absence of right leg below knee: Secondary | ICD-10-CM | POA: Diagnosis not present

## 2024-04-19 DIAGNOSIS — R5381 Other malaise: Secondary | ICD-10-CM | POA: Diagnosis not present

## 2024-04-20 DIAGNOSIS — D509 Iron deficiency anemia, unspecified: Secondary | ICD-10-CM | POA: Diagnosis not present

## 2024-04-20 DIAGNOSIS — N186 End stage renal disease: Secondary | ICD-10-CM | POA: Diagnosis not present

## 2024-04-20 DIAGNOSIS — R110 Nausea: Secondary | ICD-10-CM | POA: Diagnosis not present

## 2024-04-20 DIAGNOSIS — E1129 Type 2 diabetes mellitus with other diabetic kidney complication: Secondary | ICD-10-CM | POA: Diagnosis not present

## 2024-04-20 DIAGNOSIS — R197 Diarrhea, unspecified: Secondary | ICD-10-CM | POA: Diagnosis not present

## 2024-04-20 DIAGNOSIS — N2581 Secondary hyperparathyroidism of renal origin: Secondary | ICD-10-CM | POA: Diagnosis not present

## 2024-04-20 DIAGNOSIS — Z992 Dependence on renal dialysis: Secondary | ICD-10-CM | POA: Diagnosis not present

## 2024-04-20 DIAGNOSIS — D631 Anemia in chronic kidney disease: Secondary | ICD-10-CM | POA: Diagnosis not present

## 2024-04-21 ENCOUNTER — Encounter (HOSPITAL_COMMUNITY): Payer: Self-pay

## 2024-04-22 DIAGNOSIS — Z992 Dependence on renal dialysis: Secondary | ICD-10-CM | POA: Diagnosis not present

## 2024-04-22 DIAGNOSIS — N186 End stage renal disease: Secondary | ICD-10-CM | POA: Diagnosis not present

## 2024-04-22 DIAGNOSIS — R110 Nausea: Secondary | ICD-10-CM | POA: Diagnosis not present

## 2024-04-22 DIAGNOSIS — N2581 Secondary hyperparathyroidism of renal origin: Secondary | ICD-10-CM | POA: Diagnosis not present

## 2024-04-22 DIAGNOSIS — D631 Anemia in chronic kidney disease: Secondary | ICD-10-CM | POA: Diagnosis not present

## 2024-04-22 DIAGNOSIS — R197 Diarrhea, unspecified: Secondary | ICD-10-CM | POA: Diagnosis not present

## 2024-04-22 DIAGNOSIS — E1129 Type 2 diabetes mellitus with other diabetic kidney complication: Secondary | ICD-10-CM | POA: Diagnosis not present

## 2024-04-22 DIAGNOSIS — D509 Iron deficiency anemia, unspecified: Secondary | ICD-10-CM | POA: Diagnosis not present

## 2024-04-23 DIAGNOSIS — I480 Paroxysmal atrial fibrillation: Secondary | ICD-10-CM | POA: Diagnosis not present

## 2024-04-23 DIAGNOSIS — Z4781 Encounter for orthopedic aftercare following surgical amputation: Secondary | ICD-10-CM | POA: Diagnosis not present

## 2024-04-23 DIAGNOSIS — N186 End stage renal disease: Secondary | ICD-10-CM | POA: Diagnosis not present

## 2024-04-23 DIAGNOSIS — Z89511 Acquired absence of right leg below knee: Secondary | ICD-10-CM | POA: Diagnosis not present

## 2024-04-24 DIAGNOSIS — R110 Nausea: Secondary | ICD-10-CM | POA: Diagnosis not present

## 2024-04-24 DIAGNOSIS — E1129 Type 2 diabetes mellitus with other diabetic kidney complication: Secondary | ICD-10-CM | POA: Diagnosis not present

## 2024-04-24 DIAGNOSIS — N186 End stage renal disease: Secondary | ICD-10-CM | POA: Diagnosis not present

## 2024-04-24 DIAGNOSIS — D631 Anemia in chronic kidney disease: Secondary | ICD-10-CM | POA: Diagnosis not present

## 2024-04-24 DIAGNOSIS — Z992 Dependence on renal dialysis: Secondary | ICD-10-CM | POA: Diagnosis not present

## 2024-04-24 DIAGNOSIS — R197 Diarrhea, unspecified: Secondary | ICD-10-CM | POA: Diagnosis not present

## 2024-04-24 DIAGNOSIS — N2581 Secondary hyperparathyroidism of renal origin: Secondary | ICD-10-CM | POA: Diagnosis not present

## 2024-04-24 DIAGNOSIS — D509 Iron deficiency anemia, unspecified: Secondary | ICD-10-CM | POA: Diagnosis not present

## 2024-04-27 DIAGNOSIS — N186 End stage renal disease: Secondary | ICD-10-CM | POA: Diagnosis not present

## 2024-04-27 DIAGNOSIS — R197 Diarrhea, unspecified: Secondary | ICD-10-CM | POA: Diagnosis not present

## 2024-04-27 DIAGNOSIS — D509 Iron deficiency anemia, unspecified: Secondary | ICD-10-CM | POA: Diagnosis not present

## 2024-04-27 DIAGNOSIS — Z992 Dependence on renal dialysis: Secondary | ICD-10-CM | POA: Diagnosis not present

## 2024-04-27 DIAGNOSIS — E1129 Type 2 diabetes mellitus with other diabetic kidney complication: Secondary | ICD-10-CM | POA: Diagnosis not present

## 2024-04-27 DIAGNOSIS — N2581 Secondary hyperparathyroidism of renal origin: Secondary | ICD-10-CM | POA: Diagnosis not present

## 2024-04-27 DIAGNOSIS — D631 Anemia in chronic kidney disease: Secondary | ICD-10-CM | POA: Diagnosis not present

## 2024-04-27 DIAGNOSIS — R110 Nausea: Secondary | ICD-10-CM | POA: Diagnosis not present

## 2024-04-28 ENCOUNTER — Ambulatory Visit (HOSPITAL_COMMUNITY)
Admission: RE | Admit: 2024-04-28 | Discharge: 2024-04-28 | Disposition: A | Discharge status: Home / Self Care | Source: Ambulatory Visit | Attending: Vascular Surgery | Admitting: Vascular Surgery

## 2024-04-28 ENCOUNTER — Encounter: Payer: Self-pay | Admitting: Vascular Surgery

## 2024-04-28 ENCOUNTER — Ambulatory Visit: Attending: Vascular Surgery | Admitting: Vascular Surgery

## 2024-04-28 VITALS — BP 165/70 | HR 69 | Temp 98.2°F | Resp 20 | Ht 75.0 in | Wt 147.0 lb

## 2024-04-28 DIAGNOSIS — I739 Peripheral vascular disease, unspecified: Secondary | ICD-10-CM

## 2024-04-28 DIAGNOSIS — Z992 Dependence on renal dialysis: Secondary | ICD-10-CM

## 2024-04-28 DIAGNOSIS — N186 End stage renal disease: Secondary | ICD-10-CM | POA: Diagnosis not present
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
## 2024-04-29 DIAGNOSIS — D509 Iron deficiency anemia, unspecified: Secondary | ICD-10-CM | POA: Diagnosis not present

## 2024-04-29 DIAGNOSIS — Z4781 Encounter for orthopedic aftercare following surgical amputation: Secondary | ICD-10-CM | POA: Diagnosis not present

## 2024-04-29 DIAGNOSIS — Z992 Dependence on renal dialysis: Secondary | ICD-10-CM | POA: Diagnosis not present

## 2024-04-29 DIAGNOSIS — Z89511 Acquired absence of right leg below knee: Secondary | ICD-10-CM | POA: Diagnosis not present

## 2024-04-29 DIAGNOSIS — R197 Diarrhea, unspecified: Secondary | ICD-10-CM | POA: Diagnosis not present

## 2024-04-29 DIAGNOSIS — N2581 Secondary hyperparathyroidism of renal origin: Secondary | ICD-10-CM | POA: Diagnosis not present

## 2024-04-29 DIAGNOSIS — E1129 Type 2 diabetes mellitus with other diabetic kidney complication: Secondary | ICD-10-CM | POA: Diagnosis not present

## 2024-04-29 DIAGNOSIS — N186 End stage renal disease: Secondary | ICD-10-CM | POA: Diagnosis not present

## 2024-04-29 DIAGNOSIS — I480 Paroxysmal atrial fibrillation: Secondary | ICD-10-CM | POA: Diagnosis not present

## 2024-04-29 DIAGNOSIS — D631 Anemia in chronic kidney disease: Secondary | ICD-10-CM | POA: Diagnosis not present

## 2024-04-30 ENCOUNTER — Ambulatory Visit: Attending: Cardiology | Admitting: Cardiology

## 2024-04-30 ENCOUNTER — Encounter: Payer: Self-pay | Admitting: Cardiology

## 2024-04-30 ENCOUNTER — Other Ambulatory Visit (HOSPITAL_COMMUNITY): Payer: Self-pay

## 2024-04-30 VITALS — BP 145/77 | HR 97 | Resp 16 | Ht 75.0 in

## 2024-04-30 DIAGNOSIS — I25118 Atherosclerotic heart disease of native coronary artery with other forms of angina pectoris: Secondary | ICD-10-CM | POA: Diagnosis not present

## 2024-04-30 DIAGNOSIS — I350 Nonrheumatic aortic (valve) stenosis: Secondary | ICD-10-CM | POA: Diagnosis not present

## 2024-04-30 DIAGNOSIS — I739 Peripheral vascular disease, unspecified: Secondary | ICD-10-CM | POA: Diagnosis not present

## 2024-04-30 MED ORDER — CARVEDILOL 6.25 MG PO TABS: 6.2500 mg | ORAL_TABLET | Freq: Two times a day (BID) | ORAL | 3 refills | Status: DC

## 2024-04-30 MED ORDER — CARVEDILOL 6.25 MG PO TABS: 6.2500 mg | ORAL_TABLET | Freq: Two times a day (BID) | ORAL | Status: DC

## 2024-04-30 NOTE — Patient Instructions (Signed)
 Medication Instructions:  INCREASE Carvedilol  to 6.25 mg twice a day  *If you need a refill on your cardiac medications before your next appointment, please call your pharmacy*  Testing/Procedures: ECHOCARDIOGRAM Your physician has requested that you have an echocardiogram. Echocardiography is a painless test that uses sound waves to create images of your heart. It provides your doctor with information about the size and shape of your heart and how well your heart's chambers and valves are working. This procedure takes approximately one hour. There are no restrictions for this procedure. Please do NOT wear cologne, perfume, aftershave, or lotions (deodorant is allowed). Please arrive 15 minutes prior to your appointment time.  Please note: We ask at that you not bring children with you during ultrasound (echo/ vascular) testing. Due to room size and safety concerns, children are not allowed in the ultrasound rooms during exams. Our front office staff cannot provide observation of children in our lobby area while testing is being conducted. An adult accompanying a patient to their appointment will only be allowed in the ultrasound room at the discretion of the ultrasound technician under special circumstances. We apologize for any inconvenience.   Follow-Up: At [facility], you and your health needs are our priority.  As part of our continuing mission to provide you with exceptional heart care, our providers are all part of one team.  This team includes your primary Cardiologist (physician) and Advanced Practice Providers or APPs (Physician Assistants and Nurse Practitioners) who all work together to provide you with the care you need, when you need it.  Your next appointment:   6 weeks   Provider:   Newman JINNY Lawrence, MD    We recommend signing up for the patient portal called MyChart.  Sign up information is provided on this After Visit Summary.  MyChart is used to connect with  patients for Virtual Visits (Telemedicine).  Patients are able to view lab/test results, encounter notes, upcoming appointments, etc.  Non-urgent messages can be sent to your provider as well.   To learn more about what you can do with MyChart, go to [email or web address].

## 2024-04-30 NOTE — Addendum Note (Signed)
 Addended by: MANDA BOTTCHER B on: 04/30/2024 06:09 PM   Modules accepted: Orders

## 2024-04-30 NOTE — Progress Notes (Signed)
 Cardiology Office Note:  .   Date:  04/30/2024  ID:  William Webb , DOB 05/09/1951, MRN [id number] PCP: Leonel Cole, MD  [facility] Providers Cardiologist:  Newman Lawrence, MD PCP: Leonel Cole, MD  Chief Complaint  Patient presents with   peripheral artery disease   Follow-up      History of Present Illness: .    William Webb  is a 73 y.o. male with hypertension, hyperlipidemia, type 2 DM,  ESRD on HD, mod AS, PAF, CAD, PAD (s/p b/l BKA), anemia  Since his last visit with me, he underwent Rt BKA. He is currently wearing a prosthesis on his left leg, undergoing rehab. He is being worked up for his anemia with suspicion for multiple myeloma. He is on Eliquis , does not take Aspirin  on a daily basis, takes it occasionally.  Vitals:   04/30/24 1033  BP: (!) 145/77  Pulse: 97  Resp: 16      ROS:  Review of Systems  Cardiovascular:  Negative for chest pain, dyspnea on exertion, leg swelling, palpitations and syncope.  Skin:  Positive for poor wound healing.     Studies Reviewed: SABRA        EKG 11/28/2023: Sinus rhythm with 1st degree A-V block with occasional Premature ventricular complexes Nonspecific T wave abnormality Prolonged QT When compared with ECG of 20-May-2023 21:46, No significant change was found  Labs 01/2024: Chol 97, TG 119, HDL 37, LDL 39 HbA1C 6.6% Hb 9.1 Cr 4.2  10/31/2023: Chol 95, TG 87, HDL 29, LDL 49  Risk Assessment/Calculations:    CHA2DS2-VASc Score = 4  This indicates a 4.8% annual risk of stroke. The patient's score is based upon: CHF History: 0 HTN History: 1 Diabetes History: 1 Stroke History: 0 Vascular Disease History: 1 Age Score: 1 Gender Score: 0     Physical Exam:   Physical Exam Vitals and nursing note reviewed.  Constitutional:      General: He is not in acute distress. Neck:     Vascular: No JVD.  Cardiovascular:     Rate and Rhythm: Normal rate and regular rhythm.     Heart  sounds: Normal heart sounds. No murmur heard. Pulmonary:     Effort: Pulmonary effort is normal.     Breath sounds: Normal breath sounds. No wheezing or rales.  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 Nonrheumatic aortic valve stenosis  I35.0 ECHOCARDIOGRAM COMPLETE    2. Coronary artery disease of native artery of native heart with stable angina pectoris (HCC)  I25.118     3. PAD (peripheral artery disease) (HCC)  I73.9         ASSESSMENT AND PLAN: .    William Webb  is a 73 y.o. male with hypertension, hyperlipidemia, type 2 DM,  ESRD on HD, mod AS, PAF, CAD, PAD (s/p b/l BKA), anemia  PAD: B/l BKA. Continue rehab, Given ongoing use of Eliquis , do not recommend Aspirin  for daily use, especially in light of his anemia. Lipids well controlled.  Aortic stenosis: Moderate. Check echocardiogram, last in 04/2023.  PAF: CHA2DS2VASc score 4, annual stroke risk 5%. Continue eliquis  5 mg bid.   CAD: No angina symptoms. Lipids well controlled.  Increase coreg  to 6.25 mg bid, skip on the morning of dialysis to avoid hypotension.  Hypertension: Increased coreg  as above,  continue amlodipine .  Anemia: Continue work up with hematology.  Meds ordered this encounter  Medications   DISCONTD: carvedilol  (COREG ) 6.25 MG tablet    Sig: Take 1 tablet (6.25 mg total) by mouth 2 (two) times daily with a meal.    Dispense:  180 tablet    Refill:  3   carvedilol  (COREG ) 6.25 MG tablet    Sig: Take 1 tablet (6.25 mg total) by mouth 2 (two) times daily with a meal.     F/u in 3 months  Signed, Newman JINNY Lawrence, MD

## 2024-05-01 DIAGNOSIS — E1129 Type 2 diabetes mellitus with other diabetic kidney complication: Secondary | ICD-10-CM | POA: Diagnosis not present

## 2024-05-01 DIAGNOSIS — D631 Anemia in chronic kidney disease: Secondary | ICD-10-CM | POA: Diagnosis not present

## 2024-05-01 DIAGNOSIS — N186 End stage renal disease: Secondary | ICD-10-CM | POA: Diagnosis not present

## 2024-05-01 DIAGNOSIS — D509 Iron deficiency anemia, unspecified: Secondary | ICD-10-CM | POA: Diagnosis not present

## 2024-05-01 DIAGNOSIS — Z992 Dependence on renal dialysis: Secondary | ICD-10-CM | POA: Diagnosis not present

## 2024-05-01 DIAGNOSIS — R197 Diarrhea, unspecified: Secondary | ICD-10-CM | POA: Diagnosis not present

## 2024-05-01 DIAGNOSIS — N2581 Secondary hyperparathyroidism of renal origin: Secondary | ICD-10-CM | POA: Diagnosis not present

## 2024-05-02 DIAGNOSIS — N186 End stage renal disease: Secondary | ICD-10-CM | POA: Diagnosis not present

## 2024-05-02 DIAGNOSIS — R5381 Other malaise: Secondary | ICD-10-CM | POA: Diagnosis not present

## 2024-05-02 DIAGNOSIS — Z89512 Acquired absence of left leg below knee: Secondary | ICD-10-CM | POA: Diagnosis not present

## 2024-05-02 DIAGNOSIS — R531 Weakness: Secondary | ICD-10-CM | POA: Diagnosis not present

## 2024-05-02 DIAGNOSIS — Z7189 Other specified counseling: Secondary | ICD-10-CM | POA: Diagnosis not present

## 2024-05-02 DIAGNOSIS — Z4789 Encounter for other orthopedic aftercare: Secondary | ICD-10-CM | POA: Diagnosis not present

## 2024-05-02 DIAGNOSIS — Z992 Dependence on renal dialysis: Secondary | ICD-10-CM | POA: Diagnosis not present

## 2024-05-02 DIAGNOSIS — Z7409 Other reduced mobility: Secondary | ICD-10-CM | POA: Diagnosis not present

## 2024-05-02 DIAGNOSIS — Z89511 Acquired absence of right leg below knee: Secondary | ICD-10-CM | POA: Diagnosis not present

## 2024-05-03 DIAGNOSIS — Z992 Dependence on renal dialysis: Secondary | ICD-10-CM | POA: Diagnosis not present

## 2024-05-03 DIAGNOSIS — I480 Paroxysmal atrial fibrillation: Secondary | ICD-10-CM | POA: Diagnosis not present

## 2024-05-03 DIAGNOSIS — Z89511 Acquired absence of right leg below knee: Secondary | ICD-10-CM | POA: Diagnosis not present

## 2024-05-03 DIAGNOSIS — Z7982 Long term (current) use of aspirin: Secondary | ICD-10-CM | POA: Diagnosis not present

## 2024-05-03 DIAGNOSIS — N186 End stage renal disease: Secondary | ICD-10-CM | POA: Diagnosis not present

## 2024-05-03 DIAGNOSIS — I509 Heart failure, unspecified: Secondary | ICD-10-CM | POA: Diagnosis not present

## 2024-05-03 DIAGNOSIS — E1151 Type 2 diabetes mellitus with diabetic peripheral angiopathy without gangrene: Secondary | ICD-10-CM | POA: Diagnosis not present

## 2024-05-03 DIAGNOSIS — Z89429 Acquired absence of other toe(s), unspecified side: Secondary | ICD-10-CM | POA: Diagnosis not present

## 2024-05-03 DIAGNOSIS — R5381 Other malaise: Secondary | ICD-10-CM | POA: Diagnosis not present

## 2024-05-03 DIAGNOSIS — E46 Unspecified protein-calorie malnutrition: Secondary | ICD-10-CM | POA: Diagnosis not present

## 2024-05-03 DIAGNOSIS — E785 Hyperlipidemia, unspecified: Secondary | ICD-10-CM | POA: Diagnosis not present

## 2024-05-03 DIAGNOSIS — M6281 Muscle weakness (generalized): Secondary | ICD-10-CM | POA: Diagnosis not present

## 2024-05-03 DIAGNOSIS — E1142 Type 2 diabetes mellitus with diabetic polyneuropathy: Secondary | ICD-10-CM | POA: Diagnosis not present

## 2024-05-03 DIAGNOSIS — Z89512 Acquired absence of left leg below knee: Secondary | ICD-10-CM | POA: Diagnosis not present

## 2024-05-03 DIAGNOSIS — E1122 Type 2 diabetes mellitus with diabetic chronic kidney disease: Secondary | ICD-10-CM | POA: Diagnosis not present

## 2024-05-03 DIAGNOSIS — Z96659 Presence of unspecified artificial knee joint: Secondary | ICD-10-CM | POA: Diagnosis not present

## 2024-05-03 DIAGNOSIS — H269 Unspecified cataract: Secondary | ICD-10-CM | POA: Diagnosis not present

## 2024-05-03 DIAGNOSIS — I2510 Atherosclerotic heart disease of native coronary artery without angina pectoris: Secondary | ICD-10-CM | POA: Diagnosis not present

## 2024-05-03 DIAGNOSIS — Z993 Dependence on wheelchair: Secondary | ICD-10-CM | POA: Diagnosis not present

## 2024-05-03 DIAGNOSIS — M797 Fibromyalgia: Secondary | ICD-10-CM | POA: Diagnosis not present

## 2024-05-03 DIAGNOSIS — Z7901 Long term (current) use of anticoagulants: Secondary | ICD-10-CM | POA: Diagnosis not present

## 2024-05-03 DIAGNOSIS — I132 Hypertensive heart and chronic kidney disease with heart failure and with stage 5 chronic kidney disease, or end stage renal disease: Secondary | ICD-10-CM | POA: Diagnosis not present

## 2024-05-03 DIAGNOSIS — M1990 Unspecified osteoarthritis, unspecified site: Secondary | ICD-10-CM | POA: Diagnosis not present

## 2024-05-03 DIAGNOSIS — H409 Unspecified glaucoma: Secondary | ICD-10-CM | POA: Diagnosis not present

## 2024-05-03 DIAGNOSIS — I252 Old myocardial infarction: Secondary | ICD-10-CM | POA: Diagnosis not present

## 2024-05-04 DIAGNOSIS — D509 Iron deficiency anemia, unspecified: Secondary | ICD-10-CM | POA: Diagnosis not present

## 2024-05-04 DIAGNOSIS — R197 Diarrhea, unspecified: Secondary | ICD-10-CM | POA: Diagnosis not present

## 2024-05-04 DIAGNOSIS — N2581 Secondary hyperparathyroidism of renal origin: Secondary | ICD-10-CM | POA: Diagnosis not present

## 2024-05-04 DIAGNOSIS — N186 End stage renal disease: Secondary | ICD-10-CM | POA: Diagnosis not present

## 2024-05-04 DIAGNOSIS — Z992 Dependence on renal dialysis: Secondary | ICD-10-CM | POA: Diagnosis not present

## 2024-05-04 DIAGNOSIS — D631 Anemia in chronic kidney disease: Secondary | ICD-10-CM | POA: Diagnosis not present

## 2024-05-04 DIAGNOSIS — E1129 Type 2 diabetes mellitus with other diabetic kidney complication: Secondary | ICD-10-CM | POA: Diagnosis not present

## 2024-05-05 DIAGNOSIS — Z7901 Long term (current) use of anticoagulants: Secondary | ICD-10-CM | POA: Diagnosis not present

## 2024-05-05 DIAGNOSIS — I132 Hypertensive heart and chronic kidney disease with heart failure and with stage 5 chronic kidney disease, or end stage renal disease: Secondary | ICD-10-CM | POA: Diagnosis not present

## 2024-05-05 DIAGNOSIS — Z96659 Presence of unspecified artificial knee joint: Secondary | ICD-10-CM | POA: Diagnosis not present

## 2024-05-05 DIAGNOSIS — H409 Unspecified glaucoma: Secondary | ICD-10-CM | POA: Diagnosis not present

## 2024-05-05 DIAGNOSIS — E46 Unspecified protein-calorie malnutrition: Secondary | ICD-10-CM | POA: Diagnosis not present

## 2024-05-05 DIAGNOSIS — E1122 Type 2 diabetes mellitus with diabetic chronic kidney disease: Secondary | ICD-10-CM | POA: Diagnosis not present

## 2024-05-05 DIAGNOSIS — Z7982 Long term (current) use of aspirin: Secondary | ICD-10-CM | POA: Diagnosis not present

## 2024-05-05 DIAGNOSIS — I2510 Atherosclerotic heart disease of native coronary artery without angina pectoris: Secondary | ICD-10-CM | POA: Diagnosis not present

## 2024-05-05 DIAGNOSIS — R5381 Other malaise: Secondary | ICD-10-CM | POA: Diagnosis not present

## 2024-05-05 DIAGNOSIS — E1142 Type 2 diabetes mellitus with diabetic polyneuropathy: Secondary | ICD-10-CM | POA: Diagnosis not present

## 2024-05-05 DIAGNOSIS — E1151 Type 2 diabetes mellitus with diabetic peripheral angiopathy without gangrene: Secondary | ICD-10-CM | POA: Diagnosis not present

## 2024-05-05 DIAGNOSIS — M1990 Unspecified osteoarthritis, unspecified site: Secondary | ICD-10-CM | POA: Diagnosis not present

## 2024-05-05 DIAGNOSIS — Z992 Dependence on renal dialysis: Secondary | ICD-10-CM | POA: Diagnosis not present

## 2024-05-05 DIAGNOSIS — I509 Heart failure, unspecified: Secondary | ICD-10-CM | POA: Diagnosis not present

## 2024-05-05 DIAGNOSIS — N186 End stage renal disease: Secondary | ICD-10-CM | POA: Diagnosis not present

## 2024-05-05 DIAGNOSIS — M797 Fibromyalgia: Secondary | ICD-10-CM | POA: Diagnosis not present

## 2024-05-05 DIAGNOSIS — M6281 Muscle weakness (generalized): Secondary | ICD-10-CM | POA: Diagnosis not present

## 2024-05-05 DIAGNOSIS — Z89429 Acquired absence of other toe(s), unspecified side: Secondary | ICD-10-CM | POA: Diagnosis not present

## 2024-05-05 DIAGNOSIS — H269 Unspecified cataract: Secondary | ICD-10-CM | POA: Diagnosis not present

## 2024-05-05 DIAGNOSIS — Z993 Dependence on wheelchair: Secondary | ICD-10-CM | POA: Diagnosis not present

## 2024-05-05 DIAGNOSIS — I252 Old myocardial infarction: Secondary | ICD-10-CM | POA: Diagnosis not present

## 2024-05-05 DIAGNOSIS — Z89511 Acquired absence of right leg below knee: Secondary | ICD-10-CM | POA: Diagnosis not present

## 2024-05-05 DIAGNOSIS — E785 Hyperlipidemia, unspecified: Secondary | ICD-10-CM | POA: Diagnosis not present

## 2024-05-05 DIAGNOSIS — Z89512 Acquired absence of left leg below knee: Secondary | ICD-10-CM | POA: Diagnosis not present

## 2024-05-05 DIAGNOSIS — I480 Paroxysmal atrial fibrillation: Secondary | ICD-10-CM | POA: Diagnosis not present

## 2024-05-06 ENCOUNTER — Other Ambulatory Visit: Payer: Self-pay | Admitting: Cardiology

## 2024-05-06 ENCOUNTER — Other Ambulatory Visit: Payer: Self-pay

## 2024-05-06 DIAGNOSIS — N2581 Secondary hyperparathyroidism of renal origin: Secondary | ICD-10-CM | POA: Diagnosis not present

## 2024-05-06 DIAGNOSIS — N186 End stage renal disease: Secondary | ICD-10-CM | POA: Diagnosis not present

## 2024-05-06 DIAGNOSIS — R197 Diarrhea, unspecified: Secondary | ICD-10-CM | POA: Diagnosis not present

## 2024-05-06 DIAGNOSIS — E1129 Type 2 diabetes mellitus with other diabetic kidney complication: Secondary | ICD-10-CM | POA: Diagnosis not present

## 2024-05-06 DIAGNOSIS — Z992 Dependence on renal dialysis: Secondary | ICD-10-CM | POA: Diagnosis not present

## 2024-05-06 DIAGNOSIS — D631 Anemia in chronic kidney disease: Secondary | ICD-10-CM | POA: Diagnosis not present

## 2024-05-06 DIAGNOSIS — D509 Iron deficiency anemia, unspecified: Secondary | ICD-10-CM | POA: Diagnosis not present

## 2024-05-06 MED ORDER — ATORVASTATIN CALCIUM 20 MG PO TABS: 20.0000 mg | ORAL_TABLET | Freq: Every day | ORAL | 3 refills | Status: AC

## 2024-05-06 MED ORDER — CARVEDILOL 6.25 MG PO TABS: 6.2500 mg | ORAL_TABLET | Freq: Two times a day (BID) | ORAL | 3 refills | Status: DC

## 2024-05-06 MED ORDER — APIXABAN 5 MG PO TABS: 5.0000 mg | ORAL_TABLET | Freq: Two times a day (BID) | ORAL | 5 refills | Status: DC

## 2024-05-06 NOTE — Telephone Encounter (Signed)
 Pt last saw Dr Elmira on 04/30/24, last labs 04/09/24 Creat 4.22, age 73, weight 66.7kg, based on specified criteria pt is on appropriate dosage of Eliquis  5mg  BID for afib.  Will refill rx.

## 2024-05-07 DIAGNOSIS — M797 Fibromyalgia: Secondary | ICD-10-CM | POA: Diagnosis not present

## 2024-05-07 DIAGNOSIS — Z89512 Acquired absence of left leg below knee: Secondary | ICD-10-CM | POA: Diagnosis not present

## 2024-05-07 DIAGNOSIS — Z89511 Acquired absence of right leg below knee: Secondary | ICD-10-CM | POA: Diagnosis not present

## 2024-05-07 DIAGNOSIS — H269 Unspecified cataract: Secondary | ICD-10-CM | POA: Diagnosis not present

## 2024-05-07 DIAGNOSIS — E1122 Type 2 diabetes mellitus with diabetic chronic kidney disease: Secondary | ICD-10-CM | POA: Diagnosis not present

## 2024-05-07 DIAGNOSIS — Z992 Dependence on renal dialysis: Secondary | ICD-10-CM | POA: Diagnosis not present

## 2024-05-07 DIAGNOSIS — I509 Heart failure, unspecified: Secondary | ICD-10-CM | POA: Diagnosis not present

## 2024-05-07 DIAGNOSIS — I132 Hypertensive heart and chronic kidney disease with heart failure and with stage 5 chronic kidney disease, or end stage renal disease: Secondary | ICD-10-CM | POA: Diagnosis not present

## 2024-05-07 DIAGNOSIS — Z7982 Long term (current) use of aspirin: Secondary | ICD-10-CM | POA: Diagnosis not present

## 2024-05-07 DIAGNOSIS — I480 Paroxysmal atrial fibrillation: Secondary | ICD-10-CM | POA: Diagnosis not present

## 2024-05-07 DIAGNOSIS — H409 Unspecified glaucoma: Secondary | ICD-10-CM | POA: Diagnosis not present

## 2024-05-07 DIAGNOSIS — E785 Hyperlipidemia, unspecified: Secondary | ICD-10-CM | POA: Diagnosis not present

## 2024-05-07 DIAGNOSIS — M1990 Unspecified osteoarthritis, unspecified site: Secondary | ICD-10-CM | POA: Diagnosis not present

## 2024-05-07 DIAGNOSIS — M6281 Muscle weakness (generalized): Secondary | ICD-10-CM | POA: Diagnosis not present

## 2024-05-07 DIAGNOSIS — E1142 Type 2 diabetes mellitus with diabetic polyneuropathy: Secondary | ICD-10-CM | POA: Diagnosis not present

## 2024-05-07 DIAGNOSIS — Z7901 Long term (current) use of anticoagulants: Secondary | ICD-10-CM | POA: Diagnosis not present

## 2024-05-07 DIAGNOSIS — E1151 Type 2 diabetes mellitus with diabetic peripheral angiopathy without gangrene: Secondary | ICD-10-CM | POA: Diagnosis not present

## 2024-05-07 DIAGNOSIS — R5381 Other malaise: Secondary | ICD-10-CM | POA: Diagnosis not present

## 2024-05-07 DIAGNOSIS — Z993 Dependence on wheelchair: Secondary | ICD-10-CM | POA: Diagnosis not present

## 2024-05-07 DIAGNOSIS — I252 Old myocardial infarction: Secondary | ICD-10-CM | POA: Diagnosis not present

## 2024-05-07 DIAGNOSIS — Z89429 Acquired absence of other toe(s), unspecified side: Secondary | ICD-10-CM | POA: Diagnosis not present

## 2024-05-07 DIAGNOSIS — I2510 Atherosclerotic heart disease of native coronary artery without angina pectoris: Secondary | ICD-10-CM | POA: Diagnosis not present

## 2024-05-07 DIAGNOSIS — N186 End stage renal disease: Secondary | ICD-10-CM | POA: Diagnosis not present

## 2024-05-07 DIAGNOSIS — E46 Unspecified protein-calorie malnutrition: Secondary | ICD-10-CM | POA: Diagnosis not present

## 2024-05-07 DIAGNOSIS — Z96659 Presence of unspecified artificial knee joint: Secondary | ICD-10-CM | POA: Diagnosis not present

## 2024-05-08 DIAGNOSIS — R197 Diarrhea, unspecified: Secondary | ICD-10-CM | POA: Diagnosis not present

## 2024-05-08 DIAGNOSIS — D509 Iron deficiency anemia, unspecified: Secondary | ICD-10-CM | POA: Diagnosis not present

## 2024-05-08 DIAGNOSIS — E1129 Type 2 diabetes mellitus with other diabetic kidney complication: Secondary | ICD-10-CM | POA: Diagnosis not present

## 2024-05-08 DIAGNOSIS — N2581 Secondary hyperparathyroidism of renal origin: Secondary | ICD-10-CM | POA: Diagnosis not present

## 2024-05-08 DIAGNOSIS — N186 End stage renal disease: Secondary | ICD-10-CM | POA: Diagnosis not present

## 2024-05-08 DIAGNOSIS — Z992 Dependence on renal dialysis: Secondary | ICD-10-CM | POA: Diagnosis not present

## 2024-05-08 DIAGNOSIS — D631 Anemia in chronic kidney disease: Secondary | ICD-10-CM | POA: Diagnosis not present

## 2024-05-11 ENCOUNTER — Other Ambulatory Visit: Payer: Self-pay | Admitting: Radiology

## 2024-05-11 DIAGNOSIS — D472 Monoclonal gammopathy: Secondary | ICD-10-CM

## 2024-05-11 DIAGNOSIS — N2581 Secondary hyperparathyroidism of renal origin: Secondary | ICD-10-CM | POA: Diagnosis not present

## 2024-05-11 DIAGNOSIS — D509 Iron deficiency anemia, unspecified: Secondary | ICD-10-CM | POA: Diagnosis not present

## 2024-05-11 DIAGNOSIS — D631 Anemia in chronic kidney disease: Secondary | ICD-10-CM | POA: Diagnosis not present

## 2024-05-11 DIAGNOSIS — E1129 Type 2 diabetes mellitus with other diabetic kidney complication: Secondary | ICD-10-CM | POA: Diagnosis not present

## 2024-05-11 DIAGNOSIS — Z992 Dependence on renal dialysis: Secondary | ICD-10-CM | POA: Diagnosis not present

## 2024-05-11 DIAGNOSIS — R197 Diarrhea, unspecified: Secondary | ICD-10-CM | POA: Diagnosis not present

## 2024-05-11 DIAGNOSIS — N186 End stage renal disease: Secondary | ICD-10-CM | POA: Diagnosis not present
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
## 2024-05-12 ENCOUNTER — Ambulatory Visit (HOSPITAL_COMMUNITY)
Admission: RE | Admit: 2024-05-12 | Discharge: 2024-05-12 | Disposition: A | Discharge status: Home / Self Care | Source: Ambulatory Visit | Attending: Internal Medicine | Admitting: Internal Medicine

## 2024-05-12 ENCOUNTER — Ambulatory Visit (HOSPITAL_COMMUNITY)

## 2024-05-12 ENCOUNTER — Ambulatory Visit (HOSPITAL_COMMUNITY)
Admission: RE | Admit: 2024-05-12 | Discharge: 2024-05-12 | Disposition: A | Discharge status: Home / Self Care | Source: Ambulatory Visit | Attending: Hematology and Oncology | Admitting: Hematology and Oncology

## 2024-05-12 DIAGNOSIS — R2689 Other abnormalities of gait and mobility: Secondary | ICD-10-CM | POA: Diagnosis not present

## 2024-05-12 DIAGNOSIS — I96 Gangrene, not elsewhere classified: Secondary | ICD-10-CM | POA: Diagnosis not present

## 2024-05-12 DIAGNOSIS — Z89512 Acquired absence of left leg below knee: Secondary | ICD-10-CM | POA: Diagnosis not present

## 2024-05-12 DIAGNOSIS — I350 Nonrheumatic aortic (valve) stenosis: Secondary | ICD-10-CM | POA: Insufficient documentation

## 2024-05-12 DIAGNOSIS — M6281 Muscle weakness (generalized): Secondary | ICD-10-CM | POA: Diagnosis not present

## 2024-05-12 LAB — ECHOCARDIOGRAM COMPLETE
AR max vel: 1.34 cm2
AV Area VTI: 1.27 cm2
AV Area mean vel: 1.29 cm2
AV Mean grad: 12 mmHg
AV Peak grad: 20.8 mmHg
Ao pk vel: 2.28 m/s
Area-P 1/2: 4.58 cm2
Calc EF: 48.2 %
MV VTI: 2.14 cm2
S' Lateral: 4.01 cm
Single Plane A2C EF: 43.7 %
Single Plane A4C EF: 51.3 %

## 2024-05-12 NOTE — Addendum Note (Signed)
 Encounter addended by: Sharlet Cherene LABOR, RDMS on: 05/12/2024 12:25 PM  Actions taken: Imaging Exam ended

## 2024-05-13 DIAGNOSIS — R197 Diarrhea, unspecified: Secondary | ICD-10-CM | POA: Diagnosis not present

## 2024-05-13 DIAGNOSIS — N2581 Secondary hyperparathyroidism of renal origin: Secondary | ICD-10-CM | POA: Diagnosis not present

## 2024-05-13 DIAGNOSIS — N186 End stage renal disease: Secondary | ICD-10-CM | POA: Diagnosis not present

## 2024-05-13 DIAGNOSIS — Z992 Dependence on renal dialysis: Secondary | ICD-10-CM | POA: Diagnosis not present

## 2024-05-13 DIAGNOSIS — D509 Iron deficiency anemia, unspecified: Secondary | ICD-10-CM | POA: Diagnosis not present

## 2024-05-13 DIAGNOSIS — E1129 Type 2 diabetes mellitus with other diabetic kidney complication: Secondary | ICD-10-CM | POA: Diagnosis not present

## 2024-05-13 DIAGNOSIS — D631 Anemia in chronic kidney disease: Secondary | ICD-10-CM | POA: Diagnosis not present

## 2024-05-14 DIAGNOSIS — H409 Unspecified glaucoma: Secondary | ICD-10-CM | POA: Diagnosis not present

## 2024-05-14 DIAGNOSIS — E46 Unspecified protein-calorie malnutrition: Secondary | ICD-10-CM | POA: Diagnosis not present

## 2024-05-14 DIAGNOSIS — E1142 Type 2 diabetes mellitus with diabetic polyneuropathy: Secondary | ICD-10-CM | POA: Diagnosis not present

## 2024-05-14 DIAGNOSIS — E1151 Type 2 diabetes mellitus with diabetic peripheral angiopathy without gangrene: Secondary | ICD-10-CM | POA: Diagnosis not present

## 2024-05-14 DIAGNOSIS — I252 Old myocardial infarction: Secondary | ICD-10-CM | POA: Diagnosis not present

## 2024-05-14 DIAGNOSIS — E1122 Type 2 diabetes mellitus with diabetic chronic kidney disease: Secondary | ICD-10-CM | POA: Diagnosis not present

## 2024-05-14 DIAGNOSIS — R5381 Other malaise: Secondary | ICD-10-CM | POA: Diagnosis not present

## 2024-05-14 DIAGNOSIS — Z89512 Acquired absence of left leg below knee: Secondary | ICD-10-CM | POA: Diagnosis not present

## 2024-05-14 DIAGNOSIS — Z89429 Acquired absence of other toe(s), unspecified side: Secondary | ICD-10-CM | POA: Diagnosis not present

## 2024-05-14 DIAGNOSIS — Z7982 Long term (current) use of aspirin: Secondary | ICD-10-CM | POA: Diagnosis not present

## 2024-05-14 DIAGNOSIS — I480 Paroxysmal atrial fibrillation: Secondary | ICD-10-CM | POA: Diagnosis not present

## 2024-05-14 DIAGNOSIS — M797 Fibromyalgia: Secondary | ICD-10-CM | POA: Diagnosis not present

## 2024-05-14 DIAGNOSIS — Z89511 Acquired absence of right leg below knee: Secondary | ICD-10-CM | POA: Diagnosis not present

## 2024-05-14 DIAGNOSIS — M1990 Unspecified osteoarthritis, unspecified site: Secondary | ICD-10-CM | POA: Diagnosis not present

## 2024-05-14 DIAGNOSIS — Z96659 Presence of unspecified artificial knee joint: Secondary | ICD-10-CM | POA: Diagnosis not present

## 2024-05-14 DIAGNOSIS — I2510 Atherosclerotic heart disease of native coronary artery without angina pectoris: Secondary | ICD-10-CM | POA: Diagnosis not present

## 2024-05-14 DIAGNOSIS — I509 Heart failure, unspecified: Secondary | ICD-10-CM | POA: Diagnosis not present

## 2024-05-14 DIAGNOSIS — Z993 Dependence on wheelchair: Secondary | ICD-10-CM | POA: Diagnosis not present

## 2024-05-14 DIAGNOSIS — Z992 Dependence on renal dialysis: Secondary | ICD-10-CM | POA: Diagnosis not present

## 2024-05-14 DIAGNOSIS — Z7901 Long term (current) use of anticoagulants: Secondary | ICD-10-CM | POA: Diagnosis not present

## 2024-05-14 DIAGNOSIS — I132 Hypertensive heart and chronic kidney disease with heart failure and with stage 5 chronic kidney disease, or end stage renal disease: Secondary | ICD-10-CM | POA: Diagnosis not present

## 2024-05-14 DIAGNOSIS — H269 Unspecified cataract: Secondary | ICD-10-CM | POA: Diagnosis not present

## 2024-05-14 DIAGNOSIS — E785 Hyperlipidemia, unspecified: Secondary | ICD-10-CM | POA: Diagnosis not present

## 2024-05-14 DIAGNOSIS — M6281 Muscle weakness (generalized): Secondary | ICD-10-CM | POA: Diagnosis not present

## 2024-05-14 DIAGNOSIS — N186 End stage renal disease: Secondary | ICD-10-CM | POA: Diagnosis not present

## 2024-05-15 DIAGNOSIS — N186 End stage renal disease: Secondary | ICD-10-CM | POA: Diagnosis not present

## 2024-05-15 DIAGNOSIS — Z992 Dependence on renal dialysis: Secondary | ICD-10-CM | POA: Diagnosis not present

## 2024-05-15 DIAGNOSIS — D631 Anemia in chronic kidney disease: Secondary | ICD-10-CM | POA: Diagnosis not present

## 2024-05-15 DIAGNOSIS — N2581 Secondary hyperparathyroidism of renal origin: Secondary | ICD-10-CM | POA: Diagnosis not present

## 2024-05-15 DIAGNOSIS — E1129 Type 2 diabetes mellitus with other diabetic kidney complication: Secondary | ICD-10-CM | POA: Diagnosis not present

## 2024-05-15 DIAGNOSIS — D509 Iron deficiency anemia, unspecified: Secondary | ICD-10-CM | POA: Diagnosis not present

## 2024-05-15 DIAGNOSIS — R197 Diarrhea, unspecified: Secondary | ICD-10-CM | POA: Diagnosis not present

## 2024-05-17 ENCOUNTER — Ambulatory Visit: Payer: Self-pay | Admitting: Cardiology

## 2024-05-17 DIAGNOSIS — I5020 Unspecified systolic (congestive) heart failure: Secondary | ICD-10-CM

## 2024-05-17 DIAGNOSIS — I2510 Atherosclerotic heart disease of native coronary artery without angina pectoris: Secondary | ICD-10-CM

## 2024-05-17 DIAGNOSIS — I10 Essential (primary) hypertension: Secondary | ICD-10-CM

## 2024-05-17 NOTE — Progress Notes (Signed)
 New finding of reduced ejection fraction. Aortic valve stenossi likely only mild to moderate.   Reduced LVEF will need further workup and medical management. Recommend starting Entresto  24-26 mg bid, BMP and proBNP in 1 week and follow up with pharmD for GDMT uptitration. In addition, given patients' risk factors, also recommend PET CT stress test to evaluate for ischemia.  If patient is not comfortable with above recommendations without further discussion, happy to see him in office to discuss further.   Thanks MJP

## 2024-05-18 DIAGNOSIS — Z992 Dependence on renal dialysis: Secondary | ICD-10-CM | POA: Diagnosis not present

## 2024-05-18 DIAGNOSIS — R197 Diarrhea, unspecified: Secondary | ICD-10-CM | POA: Diagnosis not present

## 2024-05-18 DIAGNOSIS — D509 Iron deficiency anemia, unspecified: Secondary | ICD-10-CM | POA: Diagnosis not present

## 2024-05-18 DIAGNOSIS — E1129 Type 2 diabetes mellitus with other diabetic kidney complication: Secondary | ICD-10-CM | POA: Diagnosis not present

## 2024-05-18 DIAGNOSIS — N2581 Secondary hyperparathyroidism of renal origin: Secondary | ICD-10-CM | POA: Diagnosis not present

## 2024-05-18 DIAGNOSIS — D631 Anemia in chronic kidney disease: Secondary | ICD-10-CM | POA: Diagnosis not present

## 2024-05-18 DIAGNOSIS — N186 End stage renal disease: Secondary | ICD-10-CM | POA: Diagnosis not present

## 2024-05-19 DIAGNOSIS — M1990 Unspecified osteoarthritis, unspecified site: Secondary | ICD-10-CM | POA: Diagnosis not present

## 2024-05-19 DIAGNOSIS — E785 Hyperlipidemia, unspecified: Secondary | ICD-10-CM | POA: Diagnosis not present

## 2024-05-19 DIAGNOSIS — H269 Unspecified cataract: Secondary | ICD-10-CM | POA: Diagnosis not present

## 2024-05-19 DIAGNOSIS — Z993 Dependence on wheelchair: Secondary | ICD-10-CM | POA: Diagnosis not present

## 2024-05-19 DIAGNOSIS — I509 Heart failure, unspecified: Secondary | ICD-10-CM | POA: Diagnosis not present

## 2024-05-19 DIAGNOSIS — Z89512 Acquired absence of left leg below knee: Secondary | ICD-10-CM | POA: Diagnosis not present

## 2024-05-19 DIAGNOSIS — Z7982 Long term (current) use of aspirin: Secondary | ICD-10-CM | POA: Diagnosis not present

## 2024-05-19 DIAGNOSIS — Z992 Dependence on renal dialysis: Secondary | ICD-10-CM | POA: Diagnosis not present

## 2024-05-19 DIAGNOSIS — Z89429 Acquired absence of other toe(s), unspecified side: Secondary | ICD-10-CM | POA: Diagnosis not present

## 2024-05-19 DIAGNOSIS — I132 Hypertensive heart and chronic kidney disease with heart failure and with stage 5 chronic kidney disease, or end stage renal disease: Secondary | ICD-10-CM | POA: Diagnosis not present

## 2024-05-19 DIAGNOSIS — H409 Unspecified glaucoma: Secondary | ICD-10-CM | POA: Diagnosis not present

## 2024-05-19 DIAGNOSIS — I252 Old myocardial infarction: Secondary | ICD-10-CM | POA: Diagnosis not present

## 2024-05-19 DIAGNOSIS — M6281 Muscle weakness (generalized): Secondary | ICD-10-CM | POA: Diagnosis not present

## 2024-05-19 DIAGNOSIS — M797 Fibromyalgia: Secondary | ICD-10-CM | POA: Diagnosis not present

## 2024-05-19 DIAGNOSIS — I480 Paroxysmal atrial fibrillation: Secondary | ICD-10-CM | POA: Diagnosis not present

## 2024-05-19 DIAGNOSIS — Z7901 Long term (current) use of anticoagulants: Secondary | ICD-10-CM | POA: Diagnosis not present

## 2024-05-19 DIAGNOSIS — E46 Unspecified protein-calorie malnutrition: Secondary | ICD-10-CM | POA: Diagnosis not present

## 2024-05-19 DIAGNOSIS — I2510 Atherosclerotic heart disease of native coronary artery without angina pectoris: Secondary | ICD-10-CM | POA: Diagnosis not present

## 2024-05-19 DIAGNOSIS — R5381 Other malaise: Secondary | ICD-10-CM | POA: Diagnosis not present

## 2024-05-19 DIAGNOSIS — E1142 Type 2 diabetes mellitus with diabetic polyneuropathy: Secondary | ICD-10-CM | POA: Diagnosis not present

## 2024-05-19 DIAGNOSIS — Z89511 Acquired absence of right leg below knee: Secondary | ICD-10-CM | POA: Diagnosis not present

## 2024-05-19 DIAGNOSIS — Z96659 Presence of unspecified artificial knee joint: Secondary | ICD-10-CM | POA: Diagnosis not present

## 2024-05-19 DIAGNOSIS — E1122 Type 2 diabetes mellitus with diabetic chronic kidney disease: Secondary | ICD-10-CM | POA: Diagnosis not present

## 2024-05-19 DIAGNOSIS — E1151 Type 2 diabetes mellitus with diabetic peripheral angiopathy without gangrene: Secondary | ICD-10-CM | POA: Diagnosis not present

## 2024-05-19 DIAGNOSIS — N186 End stage renal disease: Secondary | ICD-10-CM | POA: Diagnosis not present

## 2024-05-19 MED ORDER — SACUBITRIL-VALSARTAN 24-26 MG PO TABS: 1.0000 | ORAL_TABLET | Freq: Two times a day (BID) | ORAL | 3 refills | Status: DC

## 2024-05-20 ENCOUNTER — Other Ambulatory Visit: Payer: Self-pay | Admitting: Cardiology

## 2024-05-20 DIAGNOSIS — N186 End stage renal disease: Secondary | ICD-10-CM | POA: Diagnosis not present

## 2024-05-20 DIAGNOSIS — E1129 Type 2 diabetes mellitus with other diabetic kidney complication: Secondary | ICD-10-CM | POA: Diagnosis not present

## 2024-05-20 DIAGNOSIS — Z992 Dependence on renal dialysis: Secondary | ICD-10-CM | POA: Diagnosis not present

## 2024-05-20 DIAGNOSIS — R197 Diarrhea, unspecified: Secondary | ICD-10-CM | POA: Diagnosis not present

## 2024-05-20 DIAGNOSIS — D631 Anemia in chronic kidney disease: Secondary | ICD-10-CM | POA: Diagnosis not present

## 2024-05-20 DIAGNOSIS — D509 Iron deficiency anemia, unspecified: Secondary | ICD-10-CM | POA: Diagnosis not present

## 2024-05-20 DIAGNOSIS — N2581 Secondary hyperparathyroidism of renal origin: Secondary | ICD-10-CM | POA: Diagnosis not present

## 2024-05-21 ENCOUNTER — Encounter: Admitting: Physical Medicine and Rehabilitation

## 2024-05-21 DIAGNOSIS — I480 Paroxysmal atrial fibrillation: Secondary | ICD-10-CM | POA: Diagnosis not present

## 2024-05-21 DIAGNOSIS — I2510 Atherosclerotic heart disease of native coronary artery without angina pectoris: Secondary | ICD-10-CM | POA: Diagnosis not present

## 2024-05-21 DIAGNOSIS — E1142 Type 2 diabetes mellitus with diabetic polyneuropathy: Secondary | ICD-10-CM | POA: Diagnosis not present

## 2024-05-21 DIAGNOSIS — Z992 Dependence on renal dialysis: Secondary | ICD-10-CM | POA: Diagnosis not present

## 2024-05-21 DIAGNOSIS — Z89429 Acquired absence of other toe(s), unspecified side: Secondary | ICD-10-CM | POA: Diagnosis not present

## 2024-05-21 DIAGNOSIS — H409 Unspecified glaucoma: Secondary | ICD-10-CM | POA: Diagnosis not present

## 2024-05-21 DIAGNOSIS — M1990 Unspecified osteoarthritis, unspecified site: Secondary | ICD-10-CM | POA: Diagnosis not present

## 2024-05-21 DIAGNOSIS — H269 Unspecified cataract: Secondary | ICD-10-CM | POA: Diagnosis not present

## 2024-05-21 DIAGNOSIS — M6281 Muscle weakness (generalized): Secondary | ICD-10-CM | POA: Diagnosis not present

## 2024-05-21 DIAGNOSIS — Z993 Dependence on wheelchair: Secondary | ICD-10-CM | POA: Diagnosis not present

## 2024-05-21 DIAGNOSIS — E785 Hyperlipidemia, unspecified: Secondary | ICD-10-CM | POA: Diagnosis not present

## 2024-05-21 DIAGNOSIS — Z7982 Long term (current) use of aspirin: Secondary | ICD-10-CM | POA: Diagnosis not present

## 2024-05-21 DIAGNOSIS — M797 Fibromyalgia: Secondary | ICD-10-CM | POA: Diagnosis not present

## 2024-05-21 DIAGNOSIS — R5381 Other malaise: Secondary | ICD-10-CM | POA: Diagnosis not present

## 2024-05-21 DIAGNOSIS — I509 Heart failure, unspecified: Secondary | ICD-10-CM | POA: Diagnosis not present

## 2024-05-21 DIAGNOSIS — Z89511 Acquired absence of right leg below knee: Secondary | ICD-10-CM | POA: Diagnosis not present

## 2024-05-21 DIAGNOSIS — Z7901 Long term (current) use of anticoagulants: Secondary | ICD-10-CM | POA: Diagnosis not present

## 2024-05-21 DIAGNOSIS — I132 Hypertensive heart and chronic kidney disease with heart failure and with stage 5 chronic kidney disease, or end stage renal disease: Secondary | ICD-10-CM | POA: Diagnosis not present

## 2024-05-21 DIAGNOSIS — E1151 Type 2 diabetes mellitus with diabetic peripheral angiopathy without gangrene: Secondary | ICD-10-CM | POA: Diagnosis not present

## 2024-05-21 DIAGNOSIS — E1122 Type 2 diabetes mellitus with diabetic chronic kidney disease: Secondary | ICD-10-CM | POA: Diagnosis not present

## 2024-05-21 DIAGNOSIS — Z89512 Acquired absence of left leg below knee: Secondary | ICD-10-CM | POA: Diagnosis not present

## 2024-05-21 DIAGNOSIS — E46 Unspecified protein-calorie malnutrition: Secondary | ICD-10-CM | POA: Diagnosis not present

## 2024-05-21 DIAGNOSIS — N186 End stage renal disease: Secondary | ICD-10-CM | POA: Diagnosis not present

## 2024-05-21 DIAGNOSIS — Z96659 Presence of unspecified artificial knee joint: Secondary | ICD-10-CM | POA: Diagnosis not present

## 2024-05-21 DIAGNOSIS — I252 Old myocardial infarction: Secondary | ICD-10-CM | POA: Diagnosis not present

## 2024-05-21 NOTE — Telephone Encounter (Signed)
 Pt of Dr. Elmira. Is it ok to refill for 90 days? Please advise.

## 2024-05-21 NOTE — Telephone Encounter (Signed)
Prescription was sent in yesterday.

## 2024-05-22 DIAGNOSIS — D509 Iron deficiency anemia, unspecified: Secondary | ICD-10-CM | POA: Diagnosis not present

## 2024-05-22 DIAGNOSIS — N186 End stage renal disease: Secondary | ICD-10-CM | POA: Diagnosis not present

## 2024-05-22 DIAGNOSIS — Z992 Dependence on renal dialysis: Secondary | ICD-10-CM | POA: Diagnosis not present

## 2024-05-22 DIAGNOSIS — D631 Anemia in chronic kidney disease: Secondary | ICD-10-CM | POA: Diagnosis not present

## 2024-05-22 DIAGNOSIS — R197 Diarrhea, unspecified: Secondary | ICD-10-CM | POA: Diagnosis not present

## 2024-05-22 DIAGNOSIS — E1129 Type 2 diabetes mellitus with other diabetic kidney complication: Secondary | ICD-10-CM | POA: Diagnosis not present

## 2024-05-22 DIAGNOSIS — N2581 Secondary hyperparathyroidism of renal origin: Secondary | ICD-10-CM | POA: Diagnosis not present

## 2024-05-25 DIAGNOSIS — N186 End stage renal disease: Secondary | ICD-10-CM | POA: Diagnosis not present

## 2024-05-25 DIAGNOSIS — E1129 Type 2 diabetes mellitus with other diabetic kidney complication: Secondary | ICD-10-CM | POA: Diagnosis not present

## 2024-05-25 DIAGNOSIS — D509 Iron deficiency anemia, unspecified: Secondary | ICD-10-CM | POA: Diagnosis not present

## 2024-05-25 DIAGNOSIS — R197 Diarrhea, unspecified: Secondary | ICD-10-CM | POA: Diagnosis not present

## 2024-05-25 DIAGNOSIS — Z992 Dependence on renal dialysis: Secondary | ICD-10-CM | POA: Diagnosis not present

## 2024-05-25 DIAGNOSIS — N2581 Secondary hyperparathyroidism of renal origin: Secondary | ICD-10-CM | POA: Diagnosis not present

## 2024-05-25 DIAGNOSIS — D631 Anemia in chronic kidney disease: Secondary | ICD-10-CM | POA: Diagnosis not present

## 2024-05-26 ENCOUNTER — Ambulatory Visit: Admitting: Physical Medicine and Rehabilitation

## 2024-05-26 ENCOUNTER — Encounter: Payer: Self-pay | Admitting: Physical Medicine and Rehabilitation

## 2024-05-26 DIAGNOSIS — M542 Cervicalgia: Secondary | ICD-10-CM | POA: Diagnosis not present

## 2024-05-26 DIAGNOSIS — R5381 Other malaise: Secondary | ICD-10-CM | POA: Diagnosis not present

## 2024-05-26 DIAGNOSIS — I509 Heart failure, unspecified: Secondary | ICD-10-CM | POA: Diagnosis not present

## 2024-05-26 DIAGNOSIS — I480 Paroxysmal atrial fibrillation: Secondary | ICD-10-CM | POA: Diagnosis not present

## 2024-05-26 DIAGNOSIS — M62541 Muscle wasting and atrophy, not elsewhere classified, right hand: Secondary | ICD-10-CM | POA: Diagnosis not present

## 2024-05-26 DIAGNOSIS — H409 Unspecified glaucoma: Secondary | ICD-10-CM | POA: Diagnosis not present

## 2024-05-26 DIAGNOSIS — Z7982 Long term (current) use of aspirin: Secondary | ICD-10-CM | POA: Diagnosis not present

## 2024-05-26 DIAGNOSIS — I132 Hypertensive heart and chronic kidney disease with heart failure and with stage 5 chronic kidney disease, or end stage renal disease: Secondary | ICD-10-CM | POA: Diagnosis not present

## 2024-05-26 DIAGNOSIS — I2510 Atherosclerotic heart disease of native coronary artery without angina pectoris: Secondary | ICD-10-CM | POA: Diagnosis not present

## 2024-05-26 DIAGNOSIS — Z992 Dependence on renal dialysis: Secondary | ICD-10-CM | POA: Diagnosis not present

## 2024-05-26 DIAGNOSIS — E1151 Type 2 diabetes mellitus with diabetic peripheral angiopathy without gangrene: Secondary | ICD-10-CM | POA: Diagnosis not present

## 2024-05-26 DIAGNOSIS — I252 Old myocardial infarction: Secondary | ICD-10-CM | POA: Diagnosis not present

## 2024-05-26 DIAGNOSIS — Z993 Dependence on wheelchair: Secondary | ICD-10-CM | POA: Diagnosis not present

## 2024-05-26 DIAGNOSIS — M5412 Radiculopathy, cervical region: Secondary | ICD-10-CM | POA: Diagnosis not present

## 2024-05-26 DIAGNOSIS — E785 Hyperlipidemia, unspecified: Secondary | ICD-10-CM | POA: Diagnosis not present

## 2024-05-26 DIAGNOSIS — Z89429 Acquired absence of other toe(s), unspecified side: Secondary | ICD-10-CM | POA: Diagnosis not present

## 2024-05-26 DIAGNOSIS — E1122 Type 2 diabetes mellitus with diabetic chronic kidney disease: Secondary | ICD-10-CM | POA: Diagnosis not present

## 2024-05-26 DIAGNOSIS — M1990 Unspecified osteoarthritis, unspecified site: Secondary | ICD-10-CM | POA: Diagnosis not present

## 2024-05-26 DIAGNOSIS — N186 End stage renal disease: Secondary | ICD-10-CM | POA: Diagnosis not present

## 2024-05-26 DIAGNOSIS — M62542 Muscle wasting and atrophy, not elsewhere classified, left hand: Secondary | ICD-10-CM | POA: Diagnosis not present

## 2024-05-26 DIAGNOSIS — H269 Unspecified cataract: Secondary | ICD-10-CM | POA: Diagnosis not present

## 2024-05-26 DIAGNOSIS — M6281 Muscle weakness (generalized): Secondary | ICD-10-CM | POA: Diagnosis not present

## 2024-05-26 DIAGNOSIS — Z89512 Acquired absence of left leg below knee: Secondary | ICD-10-CM | POA: Diagnosis not present

## 2024-05-26 DIAGNOSIS — Z89511 Acquired absence of right leg below knee: Secondary | ICD-10-CM | POA: Diagnosis not present

## 2024-05-26 DIAGNOSIS — Z96659 Presence of unspecified artificial knee joint: Secondary | ICD-10-CM | POA: Diagnosis not present

## 2024-05-26 DIAGNOSIS — E46 Unspecified protein-calorie malnutrition: Secondary | ICD-10-CM | POA: Diagnosis not present

## 2024-05-26 DIAGNOSIS — M797 Fibromyalgia: Secondary | ICD-10-CM | POA: Diagnosis not present

## 2024-05-26 DIAGNOSIS — E1142 Type 2 diabetes mellitus with diabetic polyneuropathy: Secondary | ICD-10-CM | POA: Diagnosis not present

## 2024-05-26 DIAGNOSIS — Z7901 Long term (current) use of anticoagulants: Secondary | ICD-10-CM | POA: Diagnosis not present

## 2024-05-26 NOTE — Progress Notes (Unsigned)
 Pain Scale   Average Pain 6 Patient advising he has occ neck pain that radiates to his left arm.        +Driver, -BT, -Dye Allergies.
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
## 2024-05-27 DIAGNOSIS — D509 Iron deficiency anemia, unspecified: Secondary | ICD-10-CM | POA: Diagnosis not present

## 2024-05-27 DIAGNOSIS — Z992 Dependence on renal dialysis: Secondary | ICD-10-CM | POA: Diagnosis not present

## 2024-05-27 DIAGNOSIS — E1129 Type 2 diabetes mellitus with other diabetic kidney complication: Secondary | ICD-10-CM | POA: Diagnosis not present

## 2024-05-27 DIAGNOSIS — D631 Anemia in chronic kidney disease: Secondary | ICD-10-CM | POA: Diagnosis not present

## 2024-05-27 DIAGNOSIS — N186 End stage renal disease: Secondary | ICD-10-CM | POA: Diagnosis not present

## 2024-05-27 DIAGNOSIS — N2581 Secondary hyperparathyroidism of renal origin: Secondary | ICD-10-CM | POA: Diagnosis not present

## 2024-05-27 DIAGNOSIS — R197 Diarrhea, unspecified: Secondary | ICD-10-CM | POA: Diagnosis not present

## 2024-05-28 DIAGNOSIS — N186 End stage renal disease: Secondary | ICD-10-CM | POA: Diagnosis not present

## 2024-05-28 DIAGNOSIS — E1129 Type 2 diabetes mellitus with other diabetic kidney complication: Secondary | ICD-10-CM | POA: Diagnosis not present

## 2024-05-28 DIAGNOSIS — Z992 Dependence on renal dialysis: Secondary | ICD-10-CM | POA: Diagnosis not present

## 2024-05-29 DIAGNOSIS — N2581 Secondary hyperparathyroidism of renal origin: Secondary | ICD-10-CM | POA: Diagnosis not present

## 2024-05-29 DIAGNOSIS — E1129 Type 2 diabetes mellitus with other diabetic kidney complication: Secondary | ICD-10-CM | POA: Diagnosis not present

## 2024-05-29 DIAGNOSIS — R197 Diarrhea, unspecified: Secondary | ICD-10-CM | POA: Diagnosis not present

## 2024-05-29 DIAGNOSIS — D509 Iron deficiency anemia, unspecified: Secondary | ICD-10-CM | POA: Diagnosis not present

## 2024-05-29 DIAGNOSIS — R110 Nausea: Secondary | ICD-10-CM | POA: Diagnosis not present

## 2024-05-29 DIAGNOSIS — Z992 Dependence on renal dialysis: Secondary | ICD-10-CM | POA: Diagnosis not present

## 2024-05-29 DIAGNOSIS — N186 End stage renal disease: Secondary | ICD-10-CM | POA: Diagnosis not present

## 2024-05-29 DIAGNOSIS — D631 Anemia in chronic kidney disease: Secondary | ICD-10-CM | POA: Diagnosis not present

## 2024-06-01 DIAGNOSIS — N186 End stage renal disease: Secondary | ICD-10-CM | POA: Diagnosis not present

## 2024-06-01 DIAGNOSIS — N2581 Secondary hyperparathyroidism of renal origin: Secondary | ICD-10-CM | POA: Diagnosis not present

## 2024-06-01 DIAGNOSIS — R110 Nausea: Secondary | ICD-10-CM | POA: Diagnosis not present

## 2024-06-01 DIAGNOSIS — D509 Iron deficiency anemia, unspecified: Secondary | ICD-10-CM | POA: Diagnosis not present

## 2024-06-01 DIAGNOSIS — E1129 Type 2 diabetes mellitus with other diabetic kidney complication: Secondary | ICD-10-CM | POA: Diagnosis not present

## 2024-06-01 DIAGNOSIS — D631 Anemia in chronic kidney disease: Secondary | ICD-10-CM | POA: Diagnosis not present

## 2024-06-01 DIAGNOSIS — Z992 Dependence on renal dialysis: Secondary | ICD-10-CM | POA: Diagnosis not present

## 2024-06-01 DIAGNOSIS — R197 Diarrhea, unspecified: Secondary | ICD-10-CM | POA: Diagnosis not present

## 2024-06-02 ENCOUNTER — Ambulatory Visit (HOSPITAL_COMMUNITY)
Admission: RE | Admit: 2024-06-02 | Discharge: 2024-06-02 | Disposition: A | Discharge status: Home / Self Care | Source: Ambulatory Visit | Attending: Physical Medicine and Rehabilitation | Admitting: Physical Medicine and Rehabilitation

## 2024-06-02 DIAGNOSIS — M5412 Radiculopathy, cervical region: Secondary | ICD-10-CM | POA: Insufficient documentation

## 2024-06-02 DIAGNOSIS — M4802 Spinal stenosis, cervical region: Secondary | ICD-10-CM | POA: Diagnosis not present

## 2024-06-02 DIAGNOSIS — M50223 Other cervical disc displacement at C6-C7 level: Secondary | ICD-10-CM | POA: Diagnosis not present

## 2024-06-03 DIAGNOSIS — R110 Nausea: Secondary | ICD-10-CM | POA: Diagnosis not present

## 2024-06-03 DIAGNOSIS — E1129 Type 2 diabetes mellitus with other diabetic kidney complication: Secondary | ICD-10-CM | POA: Diagnosis not present

## 2024-06-03 DIAGNOSIS — R197 Diarrhea, unspecified: Secondary | ICD-10-CM | POA: Diagnosis not present

## 2024-06-03 DIAGNOSIS — D631 Anemia in chronic kidney disease: Secondary | ICD-10-CM | POA: Diagnosis not present

## 2024-06-03 DIAGNOSIS — D509 Iron deficiency anemia, unspecified: Secondary | ICD-10-CM | POA: Diagnosis not present

## 2024-06-03 DIAGNOSIS — Z992 Dependence on renal dialysis: Secondary | ICD-10-CM | POA: Diagnosis not present

## 2024-06-03 DIAGNOSIS — N2581 Secondary hyperparathyroidism of renal origin: Secondary | ICD-10-CM | POA: Diagnosis not present

## 2024-06-03 DIAGNOSIS — N186 End stage renal disease: Secondary | ICD-10-CM | POA: Diagnosis not present

## 2024-06-04 DIAGNOSIS — R5381 Other malaise: Secondary | ICD-10-CM | POA: Diagnosis not present

## 2024-06-04 DIAGNOSIS — M6281 Muscle weakness (generalized): Secondary | ICD-10-CM | POA: Diagnosis not present

## 2024-06-04 DIAGNOSIS — E1122 Type 2 diabetes mellitus with diabetic chronic kidney disease: Secondary | ICD-10-CM | POA: Diagnosis not present

## 2024-06-04 DIAGNOSIS — E46 Unspecified protein-calorie malnutrition: Secondary | ICD-10-CM | POA: Diagnosis not present

## 2024-06-04 DIAGNOSIS — N186 End stage renal disease: Secondary | ICD-10-CM | POA: Diagnosis not present

## 2024-06-04 DIAGNOSIS — Z992 Dependence on renal dialysis: Secondary | ICD-10-CM | POA: Diagnosis not present

## 2024-06-04 DIAGNOSIS — E785 Hyperlipidemia, unspecified: Secondary | ICD-10-CM | POA: Diagnosis not present

## 2024-06-04 DIAGNOSIS — E1151 Type 2 diabetes mellitus with diabetic peripheral angiopathy without gangrene: Secondary | ICD-10-CM | POA: Diagnosis not present

## 2024-06-04 DIAGNOSIS — Z7901 Long term (current) use of anticoagulants: Secondary | ICD-10-CM | POA: Diagnosis not present

## 2024-06-04 DIAGNOSIS — I2510 Atherosclerotic heart disease of native coronary artery without angina pectoris: Secondary | ICD-10-CM | POA: Diagnosis not present

## 2024-06-04 DIAGNOSIS — H269 Unspecified cataract: Secondary | ICD-10-CM | POA: Diagnosis not present

## 2024-06-04 DIAGNOSIS — Z89429 Acquired absence of other toe(s), unspecified side: Secondary | ICD-10-CM | POA: Diagnosis not present

## 2024-06-04 DIAGNOSIS — M797 Fibromyalgia: Secondary | ICD-10-CM | POA: Diagnosis not present

## 2024-06-04 DIAGNOSIS — I252 Old myocardial infarction: Secondary | ICD-10-CM | POA: Diagnosis not present

## 2024-06-04 DIAGNOSIS — H409 Unspecified glaucoma: Secondary | ICD-10-CM | POA: Diagnosis not present

## 2024-06-04 DIAGNOSIS — Z993 Dependence on wheelchair: Secondary | ICD-10-CM | POA: Diagnosis not present

## 2024-06-04 DIAGNOSIS — I509 Heart failure, unspecified: Secondary | ICD-10-CM | POA: Diagnosis not present

## 2024-06-04 DIAGNOSIS — E1142 Type 2 diabetes mellitus with diabetic polyneuropathy: Secondary | ICD-10-CM | POA: Diagnosis not present

## 2024-06-04 DIAGNOSIS — Z89512 Acquired absence of left leg below knee: Secondary | ICD-10-CM | POA: Diagnosis not present

## 2024-06-04 DIAGNOSIS — M1990 Unspecified osteoarthritis, unspecified site: Secondary | ICD-10-CM | POA: Diagnosis not present

## 2024-06-04 DIAGNOSIS — Z96659 Presence of unspecified artificial knee joint: Secondary | ICD-10-CM | POA: Diagnosis not present

## 2024-06-04 DIAGNOSIS — Z89511 Acquired absence of right leg below knee: Secondary | ICD-10-CM | POA: Diagnosis not present

## 2024-06-04 DIAGNOSIS — I480 Paroxysmal atrial fibrillation: Secondary | ICD-10-CM | POA: Diagnosis not present

## 2024-06-04 DIAGNOSIS — Z7982 Long term (current) use of aspirin: Secondary | ICD-10-CM | POA: Diagnosis not present

## 2024-06-05 ENCOUNTER — Other Ambulatory Visit: Payer: Self-pay

## 2024-06-05 ENCOUNTER — Emergency Department (HOSPITAL_COMMUNITY)
Admission: EM | Admit: 2024-06-05 | Discharge: 2024-06-05 | Disposition: A | Discharge status: Home / Self Care | Attending: Emergency Medicine | Admitting: Emergency Medicine

## 2024-06-05 DIAGNOSIS — Z992 Dependence on renal dialysis: Secondary | ICD-10-CM | POA: Diagnosis not present

## 2024-06-05 DIAGNOSIS — D631 Anemia in chronic kidney disease: Secondary | ICD-10-CM | POA: Diagnosis not present

## 2024-06-05 DIAGNOSIS — Z7901 Long term (current) use of anticoagulants: Secondary | ICD-10-CM | POA: Insufficient documentation

## 2024-06-05 DIAGNOSIS — Y832 Surgical operation with anastomosis, bypass or graft as the cause of abnormal reaction of the patient, or of later complication, without mention of misadventure at the time of the procedure: Secondary | ICD-10-CM | POA: Diagnosis not present

## 2024-06-05 DIAGNOSIS — N186 End stage renal disease: Secondary | ICD-10-CM | POA: Diagnosis not present

## 2024-06-05 DIAGNOSIS — Z743 Need for continuous supervision: Secondary | ICD-10-CM | POA: Diagnosis not present

## 2024-06-05 DIAGNOSIS — Z7982 Long term (current) use of aspirin: Secondary | ICD-10-CM | POA: Insufficient documentation

## 2024-06-05 DIAGNOSIS — N2581 Secondary hyperparathyroidism of renal origin: Secondary | ICD-10-CM | POA: Diagnosis not present

## 2024-06-05 DIAGNOSIS — T829XXA Unspecified complication of cardiac and vascular prosthetic device, implant and graft, initial encounter: Secondary | ICD-10-CM

## 2024-06-05 DIAGNOSIS — T82898A Other specified complication of vascular prosthetic devices, implants and grafts, initial encounter: Secondary | ICD-10-CM | POA: Insufficient documentation

## 2024-06-05 DIAGNOSIS — R197 Diarrhea, unspecified: Secondary | ICD-10-CM | POA: Diagnosis not present

## 2024-06-05 DIAGNOSIS — E1129 Type 2 diabetes mellitus with other diabetic kidney complication: Secondary | ICD-10-CM | POA: Diagnosis not present

## 2024-06-05 DIAGNOSIS — T82590A Other mechanical complication of surgically created arteriovenous fistula, initial encounter: Secondary | ICD-10-CM | POA: Diagnosis not present

## 2024-06-05 DIAGNOSIS — R6889 Other general symptoms and signs: Secondary | ICD-10-CM | POA: Diagnosis not present

## 2024-06-05 DIAGNOSIS — R110 Nausea: Secondary | ICD-10-CM | POA: Diagnosis not present

## 2024-06-05 DIAGNOSIS — R58 Hemorrhage, not elsewhere classified: Secondary | ICD-10-CM | POA: Diagnosis not present

## 2024-06-05 DIAGNOSIS — D509 Iron deficiency anemia, unspecified: Secondary | ICD-10-CM | POA: Diagnosis not present

## 2024-06-05 NOTE — ED Provider Notes (Signed)
 [place] EMERGENCY DEPARTMENT AT [facility] Provider Note   CSN: [id number] Arrival date & time: 06/05/24  1735     Patient presents with: Bleeding Fistula   William Webb  is a 73 y.o. male.  He was brought in by ambulance from his dialysis center after his access would not stop bleeding.  He received a full run of dialysis.  They sent him with a clamp in place.  He denies other complaints.  He said he has had this problem before and he feels fine now and is ready to be discharged   The history is provided by the patient.       Prior to Admission medications   Medication Sig Start Date End Date Taking? Authorizing Provider  acetaminophen  (TYLENOL ) 325 MG tablet Take 1-2 tablets (325-650 mg total) by mouth every 6 (six) hours as needed for mild pain (pain score 1-3 or temp > 100.5). 06/11/23   Perri DELENA Meliton Mickey., MD  apixaban  (ELIQUIS ) 5 MG TABS tablet Take 1 tablet (5 mg total) by mouth 2 (two) times daily. 05/06/24   Patwardhan, Newman PARAS, MD  aspirin  EC 81 MG tablet Take 81 mg by mouth daily. Swallow whole.    [provider]  atorvastatin  (LIPITOR ) 20 MG tablet Take 1 tablet (20 mg total) by mouth daily. 05/06/24   Patwardhan, Newman PARAS, MD  carvedilol  (COREG ) 6.25 MG tablet Take 1 tablet (6.25 mg total) by mouth 2 (two) times daily with a meal. 05/06/24   Patwardhan, Manish J, MD  finasteride  (PROSCAR ) 5 MG tablet Take 5 mg by mouth daily. 11/08/22   [provider]  furosemide  (LASIX ) 80 MG tablet Take 80 mg by mouth. As needed 03/27/24   [provider]  gabapentin  (NEURONTIN ) 300 MG capsule Take 300 mg by mouth at bedtime.    [provider]  insulin  aspart (NOVOLOG ) 100 UNIT/ML injection Inject 3 Units into the skin daily as needed for high blood sugar.    [provider]  melatonin 5 MG TABS Take 5 mg by mouth at bedtime.    [provider]  omeprazole  (PRILOSEC ) 20 MG capsule Take 20 mg by mouth daily.     [provider]  oxyCODONE  (OXY IR/ROXICODONE ) 5 MG immediate release tablet Take 1 tablet (5 mg total) by mouth every 4 (four) hours as needed for severe pain (pain score 7-10). 01/20/24   Dahal, Chapman, MD  sacubitril -valsartan  (ENTRESTO ) 24-26 MG Take 1 tablet by mouth 2 (two) times daily. 05/19/24   Patwardhan, Newman PARAS, MD  sevelamer  carbonate (RENVELA ) 800 MG tablet Take 2 tablets (1,600 mg total) by mouth 3 (three) times daily with meals. 06/11/23   Perri DELENA Meliton Mickey., MD  tamsulosin  (FLOMAX ) 0.4 MG CAPS capsule Take 0.4 mg by mouth at bedtime. 11/16/19   [provider]    Allergies: Patient has no known allergies.    Review of Systems  Updated Vital Signs BP (!) 165/61 (BP Location: Right Arm)   Pulse 67   Temp 97.8 F (36.6 C)   Resp 18   Ht 6' 3 (1.905 m)   Wt 66 kg   SpO2 96%   BMI 18.19 kg/m   Physical Exam Vitals and nursing note reviewed.  Constitutional:      Appearance: Normal appearance. He is well-developed.  HENT:     Head: Normocephalic and atraumatic.  Eyes:     Conjunctiva/sclera: Conjunctivae normal.  Cardiovascular:     Rate and Rhythm:  Normal rate and regular rhythm.     Heart sounds: Murmur heard.  Pulmonary:     Effort: Pulmonary effort is normal.  Abdominal:     Tenderness: There is no abdominal tenderness. There is no guarding or rebound.  Musculoskeletal:        General: No deformity.     Cervical back: Neck supple.     Comments: He has vascular access send left antecubital area.  Clamp was removed and there is no active bleeding.  Skin:    General: Skin is warm and dry.  Neurological:     General: No focal deficit present.     Mental Status: He is alert.     GCS: GCS eye subscore is 4. GCS verbal subscore is 5. GCS motor subscore is 6.     (all labs ordered are listed, but only abnormal results are displayed) Labs Reviewed - No data to display  EKG: None  Radiology: No results found.   Procedures    Medications Ordered in the ED - No data to display  Clinical Course as of 06/06/24 0841  [facility] Jun 05, 2024  1818 Reassessed patient, no active bleeding.  He is asking to be discharged.  I think that is reasonable at this time.  Return instructions discussed [MB]    Clinical Course User Index [MB] Towana Ozell BROCKS, MD                                 Medical Decision Making  This patient complains of bleeding fistula; this involves an extensive number of treatment Options and is a complaint that carries with it a high risk of complications and morbidity. The differential includes bleeding, occlusion, anemia Previous records obtained and reviewed in epic including outpatient vascular notes Social determinants considered, social isolation Critical Interventions: None  After the interventions stated above, I reevaluated the patient and found patient to be asymptomatic and no evidence of continued bleeding Admission and further testing considered, patient wishes to be discharged and I think that is reasonable.  It does not sound like there was significant blood loss to warrant getting labs on him.  Return instructions discussed.      Final diagnoses:  Complication of arteriovenous dialysis fistula, initial encounter    ED Discharge Orders     None          Towana Ozell BROCKS, MD 06/06/24 [phone]

## 2024-06-05 NOTE — Discharge Instructions (Signed)
 Your fistula is showing no signs of bleeding at this time.  If it rebleeds and you are unable to stop please return to the emergency department for further care.

## 2024-06-05 NOTE — ED Triage Notes (Signed)
 Coming from dialysis with a bleeding fistula to the left arm. Takes eliquis  but didn't take today nor did he get heparin  with dialysis. Finished his treatment.  Reports about 50cc of blood loss total.  Not currently bleeding and has a pressure bandage applied via dialysis.  Pt without complaints.

## 2024-06-05 NOTE — ED Notes (Signed)
 Pt continues to have no bleeding. Provider aware. Will monitor for about 10 more minutes and DC if continues to have no bleeding.

## 2024-06-05 NOTE — ED Notes (Signed)
 Clamp removed. No bleeding noted. Pt reports it hasn't been bleeding for awhile. Will monitor for a bit to assess for further bleeding.

## 2024-06-05 NOTE — ED Notes (Signed)
 Continues to have no bleeding. To be Dc'd.

## 2024-06-08 ENCOUNTER — Other Ambulatory Visit: Payer: Self-pay | Admitting: Radiology

## 2024-06-08 DIAGNOSIS — Z992 Dependence on renal dialysis: Secondary | ICD-10-CM | POA: Diagnosis not present

## 2024-06-08 DIAGNOSIS — N2581 Secondary hyperparathyroidism of renal origin: Secondary | ICD-10-CM | POA: Diagnosis not present

## 2024-06-08 DIAGNOSIS — N186 End stage renal disease: Secondary | ICD-10-CM | POA: Diagnosis not present

## 2024-06-08 DIAGNOSIS — R197 Diarrhea, unspecified: Secondary | ICD-10-CM | POA: Diagnosis not present

## 2024-06-08 DIAGNOSIS — R110 Nausea: Secondary | ICD-10-CM | POA: Diagnosis not present

## 2024-06-08 DIAGNOSIS — D509 Iron deficiency anemia, unspecified: Secondary | ICD-10-CM | POA: Diagnosis not present

## 2024-06-08 DIAGNOSIS — D631 Anemia in chronic kidney disease: Secondary | ICD-10-CM | POA: Diagnosis not present

## 2024-06-08 DIAGNOSIS — E1129 Type 2 diabetes mellitus with other diabetic kidney complication: Secondary | ICD-10-CM | POA: Diagnosis not present

## 2024-06-09 ENCOUNTER — Ambulatory Visit (HOSPITAL_COMMUNITY)
Admission: RE | Admit: 2024-06-09 | Discharge: 2024-06-09 | Disposition: A | Discharge status: Home / Self Care | Source: Ambulatory Visit | Attending: Hematology and Oncology | Admitting: Hematology and Oncology

## 2024-06-09 ENCOUNTER — Telehealth (HOSPITAL_BASED_OUTPATIENT_CLINIC_OR_DEPARTMENT_OTHER): Payer: Self-pay

## 2024-06-09 ENCOUNTER — Encounter (HOSPITAL_COMMUNITY): Payer: Self-pay

## 2024-06-09 DIAGNOSIS — I252 Old myocardial infarction: Secondary | ICD-10-CM | POA: Diagnosis not present

## 2024-06-09 DIAGNOSIS — E46 Unspecified protein-calorie malnutrition: Secondary | ICD-10-CM | POA: Diagnosis not present

## 2024-06-09 DIAGNOSIS — M797 Fibromyalgia: Secondary | ICD-10-CM | POA: Diagnosis not present

## 2024-06-09 DIAGNOSIS — N186 End stage renal disease: Secondary | ICD-10-CM | POA: Diagnosis not present

## 2024-06-09 DIAGNOSIS — I509 Heart failure, unspecified: Secondary | ICD-10-CM | POA: Diagnosis not present

## 2024-06-09 DIAGNOSIS — Z89512 Acquired absence of left leg below knee: Secondary | ICD-10-CM | POA: Diagnosis not present

## 2024-06-09 DIAGNOSIS — Z89429 Acquired absence of other toe(s), unspecified side: Secondary | ICD-10-CM | POA: Diagnosis not present

## 2024-06-09 DIAGNOSIS — I2510 Atherosclerotic heart disease of native coronary artery without angina pectoris: Secondary | ICD-10-CM | POA: Diagnosis not present

## 2024-06-09 DIAGNOSIS — Z993 Dependence on wheelchair: Secondary | ICD-10-CM | POA: Diagnosis not present

## 2024-06-09 DIAGNOSIS — Z89511 Acquired absence of right leg below knee: Secondary | ICD-10-CM | POA: Diagnosis not present

## 2024-06-09 DIAGNOSIS — M1990 Unspecified osteoarthritis, unspecified site: Secondary | ICD-10-CM | POA: Diagnosis not present

## 2024-06-09 DIAGNOSIS — Z96659 Presence of unspecified artificial knee joint: Secondary | ICD-10-CM | POA: Diagnosis not present

## 2024-06-09 DIAGNOSIS — E785 Hyperlipidemia, unspecified: Secondary | ICD-10-CM | POA: Diagnosis not present

## 2024-06-09 DIAGNOSIS — H409 Unspecified glaucoma: Secondary | ICD-10-CM | POA: Diagnosis not present

## 2024-06-09 DIAGNOSIS — R5381 Other malaise: Secondary | ICD-10-CM | POA: Diagnosis not present

## 2024-06-09 DIAGNOSIS — Z7982 Long term (current) use of aspirin: Secondary | ICD-10-CM | POA: Diagnosis not present

## 2024-06-09 DIAGNOSIS — M6281 Muscle weakness (generalized): Secondary | ICD-10-CM | POA: Diagnosis not present

## 2024-06-09 DIAGNOSIS — E1151 Type 2 diabetes mellitus with diabetic peripheral angiopathy without gangrene: Secondary | ICD-10-CM | POA: Diagnosis not present

## 2024-06-09 DIAGNOSIS — E1142 Type 2 diabetes mellitus with diabetic polyneuropathy: Secondary | ICD-10-CM | POA: Diagnosis not present

## 2024-06-09 DIAGNOSIS — Z7901 Long term (current) use of anticoagulants: Secondary | ICD-10-CM | POA: Diagnosis not present

## 2024-06-09 DIAGNOSIS — Z992 Dependence on renal dialysis: Secondary | ICD-10-CM | POA: Diagnosis not present

## 2024-06-09 DIAGNOSIS — I480 Paroxysmal atrial fibrillation: Secondary | ICD-10-CM | POA: Diagnosis not present

## 2024-06-09 DIAGNOSIS — D472 Monoclonal gammopathy: Secondary | ICD-10-CM

## 2024-06-09 DIAGNOSIS — I132 Hypertensive heart and chronic kidney disease with heart failure and with stage 5 chronic kidney disease, or end stage renal disease: Secondary | ICD-10-CM | POA: Diagnosis not present

## 2024-06-09 DIAGNOSIS — H269 Unspecified cataract: Secondary | ICD-10-CM | POA: Diagnosis not present

## 2024-06-09 NOTE — Telephone Encounter (Signed)
 Following up on Pharm-D referral. Can someone please contact daughter to schedule this for the patient. Thank you!    ----- Message ----- From: Manda Lyle NOVAK, RN Sent: 05/21/2024   1:49 PM EDT To: Jonette Burrow Subject: RE: referral                                    Spoke to pt and he has approved to move forward with scheduling his pharmD appointment. Please contact to schedule. Thank you!      Patient Message Open 05/20/2024 [facility] at [facility] A Dept of [employer]. [facility]  Me to Kahli Fitzgerald Gossard     05/20/24  1:08 PM Hey Mr. Plante ,    I saw that you did not want to schedule the pharmacy appoiontment with our team. This appointment is to help with managing the new prescription of Entresto  that was sent to the pharmacy yesterday. This medication needs monitoring to determine if you it needs to be titrated to meet your cardiac needs. Are you okay with us  trying to get you scheduled with them?   Lalla, RN   Last read by Lupita Misty Szumski  at 1:08PM on 05/20/2024.

## 2024-06-09 NOTE — Progress Notes (Signed)
 PreOP Short Stay Nursing Note: Arrived to IR procedure this am, when interviewing client, he stated that he ate eggs, grits and coffee at approx 0630hrs this morning. Spoke with IR MD Simonne Sides, MD), informed of pt eating this am, stated to cancel procedure and have it reschedule. This RN provided pt teaching on being  and rationale for NPO status for his IR procedure. Family member informed as well.

## 2024-06-10 DIAGNOSIS — R110 Nausea: Secondary | ICD-10-CM | POA: Diagnosis not present

## 2024-06-10 DIAGNOSIS — R197 Diarrhea, unspecified: Secondary | ICD-10-CM | POA: Diagnosis not present

## 2024-06-10 DIAGNOSIS — Z992 Dependence on renal dialysis: Secondary | ICD-10-CM | POA: Diagnosis not present

## 2024-06-10 DIAGNOSIS — D509 Iron deficiency anemia, unspecified: Secondary | ICD-10-CM | POA: Diagnosis not present

## 2024-06-10 DIAGNOSIS — N186 End stage renal disease: Secondary | ICD-10-CM | POA: Diagnosis not present

## 2024-06-10 DIAGNOSIS — E1129 Type 2 diabetes mellitus with other diabetic kidney complication: Secondary | ICD-10-CM | POA: Diagnosis not present

## 2024-06-10 DIAGNOSIS — N2581 Secondary hyperparathyroidism of renal origin: Secondary | ICD-10-CM | POA: Diagnosis not present

## 2024-06-10 DIAGNOSIS — D631 Anemia in chronic kidney disease: Secondary | ICD-10-CM | POA: Diagnosis not present

## 2024-06-10 NOTE — Telephone Encounter (Signed)
 PharmD appt with Chris Pavero scheduled for 07/07/24.

## 2024-06-11 ENCOUNTER — Other Ambulatory Visit (HOSPITAL_COMMUNITY): Payer: Self-pay

## 2024-06-11 ENCOUNTER — Encounter (HOSPITAL_COMMUNITY): Payer: Self-pay

## 2024-06-11 ENCOUNTER — Telehealth: Payer: Self-pay

## 2024-06-11 NOTE — Telephone Encounter (Addendum)
 Patient Advocate Encounter   The patient was approved for a [employer] grant that will help cover the cost of ENTRESTO  Total amount awarded, $4,500.  Effective: 05/12/24 - 05/11/25   APW:[id number] ERW:[id number] Hmnle:[id number] PI:[id number]   Pharmacy provided with approval and processing information.  Ileana Lehmann, CPhT  Pharmacy Patient Advocate Specialist  Direct Number: [phone] Fax: [phone]

## 2024-06-11 NOTE — Telephone Encounter (Signed)
 Spoke with pt's daughter regarding the below recommendations from Dr. Elmira:  I reviewed recent ER visit chart.  Blood pressure remains elevated.  I do think he will benefit from Entresto  with reducing blood pressure, as well as improving heart function.  Please start Entresto  24-26 mg twice daily.  Recommend follow-up BMP a week after starting Entresto , and then follow-up with Pharm.D. for up titration of GDMT.   Thanks  MJP    Advised that pt will need to go get a repeat BMP 1 week after being on Entresto . Pt's daughter asked if there was any financial assistance for Entresto  or Eliquis . Explained that I will send a message to our rx assistance team to see if the pt is eligible for a grant for the Entresto  and I explained to the pt's daughter that there is patient assistance forms she can download and fill out for the pt and send them back in to our office (pt's daughter lives in [place]). Pt's daughter verbalized understanding of plan and had no further questions or concerns at this time.   ---------------------------------------------------------------------------------------- From: Manda Lyle NOVAK, RN  Sent: 06/09/2024   6:10 PM EDT  To: Newman [name], MD; Ronal SHAUNNA Huron, RN; R*  Subject: FW: Unread Message Notification                Contacted patient's daughter (lives in [place] ) and nephew concerning PET/CT instructions. While speaking with daughter, she expressed concern about medication. Pt has not started the use of Entresto . Reviewed last office not from Dr. Elmira with daughter and she reports that she does not see amlodipine  on patient's active meds with [facility] either, but last office visit pt advised to continue. Pt is not the most compliant with use of Eliquis  due to cost as well. Will send to patient assistance team for help with cost on Eliquis  and Entresto .   Dr. Elmira: Please confirm if needing to start Entresto  now since last ER visit and if needs to be  Amlodipine .   Peyton: Can you please review correct medications with daughter (on [place]) again to confirm accurate. If starting Entresto  then pt needs labs one week after start (already ordered) and appt with Pharm-D for titration. Referral was placed for pharm-d when echo was resulted, but has not been scheduled yet.     From: Manda Lyle NOVAK, RN  Sent: 06/09/2024   6:30 PM EDT  To: Newman [name], MD  Subject: RE: Unread Message Notification                I do not see that Entresto  was advised to be stopped by the ER or that you had stopped Amlodipine . Pt had advised that he had stopped amlodipine  on 04/30/2024 according to CMA documentation. Please advise.

## 2024-06-12 DIAGNOSIS — R197 Diarrhea, unspecified: Secondary | ICD-10-CM | POA: Diagnosis not present

## 2024-06-12 DIAGNOSIS — E1129 Type 2 diabetes mellitus with other diabetic kidney complication: Secondary | ICD-10-CM | POA: Diagnosis not present

## 2024-06-12 DIAGNOSIS — D631 Anemia in chronic kidney disease: Secondary | ICD-10-CM | POA: Diagnosis not present

## 2024-06-12 DIAGNOSIS — N2581 Secondary hyperparathyroidism of renal origin: Secondary | ICD-10-CM | POA: Diagnosis not present

## 2024-06-12 DIAGNOSIS — Z992 Dependence on renal dialysis: Secondary | ICD-10-CM | POA: Diagnosis not present

## 2024-06-12 DIAGNOSIS — N186 End stage renal disease: Secondary | ICD-10-CM | POA: Diagnosis not present

## 2024-06-12 DIAGNOSIS — R110 Nausea: Secondary | ICD-10-CM | POA: Diagnosis not present

## 2024-06-12 DIAGNOSIS — D509 Iron deficiency anemia, unspecified: Secondary | ICD-10-CM | POA: Diagnosis not present

## 2024-06-15 ENCOUNTER — Other Ambulatory Visit (HOSPITAL_COMMUNITY): Payer: Self-pay

## 2024-06-15 ENCOUNTER — Encounter (HOSPITAL_COMMUNITY): Payer: Self-pay

## 2024-06-15 ENCOUNTER — Telehealth: Payer: Self-pay

## 2024-06-15 ENCOUNTER — Other Ambulatory Visit: Payer: Self-pay

## 2024-06-15 DIAGNOSIS — Z992 Dependence on renal dialysis: Secondary | ICD-10-CM | POA: Diagnosis not present

## 2024-06-15 DIAGNOSIS — D631 Anemia in chronic kidney disease: Secondary | ICD-10-CM | POA: Diagnosis not present

## 2024-06-15 DIAGNOSIS — I480 Paroxysmal atrial fibrillation: Secondary | ICD-10-CM

## 2024-06-15 DIAGNOSIS — D509 Iron deficiency anemia, unspecified: Secondary | ICD-10-CM | POA: Diagnosis not present

## 2024-06-15 DIAGNOSIS — E1129 Type 2 diabetes mellitus with other diabetic kidney complication: Secondary | ICD-10-CM | POA: Diagnosis not present

## 2024-06-15 DIAGNOSIS — R110 Nausea: Secondary | ICD-10-CM | POA: Diagnosis not present

## 2024-06-15 DIAGNOSIS — N2581 Secondary hyperparathyroidism of renal origin: Secondary | ICD-10-CM | POA: Diagnosis not present

## 2024-06-15 DIAGNOSIS — N186 End stage renal disease: Secondary | ICD-10-CM | POA: Diagnosis not present

## 2024-06-15 DIAGNOSIS — R197 Diarrhea, unspecified: Secondary | ICD-10-CM | POA: Diagnosis not present

## 2024-06-15 MED ORDER — APIXABAN 5 MG PO TABS: 5.0000 mg | ORAL_TABLET | Freq: Two times a day (BID) | ORAL | 0 refills | Status: DC

## 2024-06-15 MED ORDER — SACUBITRIL-VALSARTAN 24-26 MG PO TABS
1.0000 | ORAL_TABLET | Freq: Two times a day (BID) | ORAL | 3 refills | Status: DC
  Filled 2024-06-15: qty 60, 30d supply, fill #0

## 2024-06-15 NOTE — Telephone Encounter (Signed)
 Spoke to daughter and advised samples can be picked up from office due to applying for patient assistance. Advised that Entresto  will be sent to our pharmacy due to grant approval.   Also, requested refill on Eliquis  to be [facility] to [facility] as well.

## 2024-06-15 NOTE — Telephone Encounter (Signed)
 Medication name/dosage: Samples List: Eliquis  5 mg  Administration instructions: take one tablet by mouth twice daily   Reason for samples: Reason for samples: unable to afford medication. Pt applying for patient assistance.   Ordering provider: Dr. Elmira  *Once above information entered, route the phone encounter to [facility] and send Teams message to team member assigned to Samples for the day.

## 2024-06-15 NOTE — Telephone Encounter (Signed)
 Eliquis  5 mg by mouth twice daily sample is ready fro a patient to pick up.

## 2024-06-15 NOTE — Telephone Encounter (Signed)
 Pt daughter contacted and advised of grant. Pt's daughter will have the medication filled. She is aware that pt will need blood work 1 week from starting Entresto . She agrees with plan of care.

## 2024-06-15 NOTE — Addendum Note (Signed)
 Addended by: TAFFY LOVEY KIDD on: 06/15/2024 04:53 PM   Modules accepted: Orders

## 2024-06-16 ENCOUNTER — Other Ambulatory Visit (HOSPITAL_COMMUNITY): Payer: Self-pay

## 2024-06-16 ENCOUNTER — Encounter: Payer: Self-pay | Admitting: Cardiology

## 2024-06-16 ENCOUNTER — Telehealth: Payer: Self-pay | Admitting: Physical Medicine and Rehabilitation

## 2024-06-16 ENCOUNTER — Ambulatory Visit: Admitting: Cardiology

## 2024-06-16 ENCOUNTER — Ambulatory Visit: Attending: Cardiology | Admitting: Cardiology

## 2024-06-16 ENCOUNTER — Telehealth: Payer: Self-pay

## 2024-06-16 VITALS — BP 130/56 | HR 63 | Ht 75.0 in | Wt 145.5 lb

## 2024-06-16 DIAGNOSIS — I5020 Unspecified systolic (congestive) heart failure: Secondary | ICD-10-CM | POA: Insufficient documentation

## 2024-06-16 DIAGNOSIS — I25118 Atherosclerotic heart disease of native coronary artery with other forms of angina pectoris: Secondary | ICD-10-CM

## 2024-06-16 DIAGNOSIS — Z89511 Acquired absence of right leg below knee: Secondary | ICD-10-CM | POA: Diagnosis not present

## 2024-06-16 DIAGNOSIS — I480 Paroxysmal atrial fibrillation: Secondary | ICD-10-CM | POA: Diagnosis not present

## 2024-06-16 DIAGNOSIS — I739 Peripheral vascular disease, unspecified: Secondary | ICD-10-CM | POA: Diagnosis not present

## 2024-06-16 MED ORDER — APIXABAN 5 MG PO TABS
5.0000 mg | ORAL_TABLET | Freq: Two times a day (BID) | ORAL | 3 refills | Status: DC
Start: 2024-06-16 — End: 2024-07-28

## 2024-06-16 MED ORDER — APIXABAN 5 MG PO TABS
5.0000 mg | ORAL_TABLET | Freq: Two times a day (BID) | ORAL | 5 refills | Status: DC
  Filled 2024-06-16: qty 60, 30d supply, fill #0

## 2024-06-16 NOTE — Progress Notes (Signed)
 Cardiology Office Note:  .   Date:  06/16/2024  ID:  William Webb , DOB [birth year], MRN [id number] PCP: Leonel Cole, MD  [facility] Providers Cardiologist:  Newman Lawrence, MD PCP: Leonel Cole, MD  Chief Complaint  Patient presents with   Heartfailure      History of Present Illness: .    William Webb  is a 73 y.o. male with hypertension, hyperlipidemia, type 2 DM,  ESRD on HD, mod AS, PAF, CAD, PAD (s/p b/l BKA), anemia, HFrEF  Patient was recently seen in the emergency room with complains of bleeding fistula.  He was discharged from the ED after initial evaluation and stabilization.  He has follow-up with vascular surgery regarding potential angioplasty and stenting of the fistula.  On a separate note, recent echocardiogram showed reduced LVEF.  He was recommended PET/CT stress testing and addition of GDMT.  He has not started Entresto  yet.  He is also not taking Eliquis  due to cost issues.  She denies any overt shortness of breath symptoms.  Blood pressure is well-controlled today.  Vitals:   06/16/24 1051  BP: (!) 130/56  Pulse: 63  SpO2: 98%       ROS:  Review of Systems  Cardiovascular:  Negative for chest pain, dyspnea on exertion, leg swelling, palpitations and syncope.     Studies Reviewed: SABRA        EKG 11/28/2023: Sinus rhythm with 1st degree A-V block with occasional Premature ventricular complexes Nonspecific T wave abnormality Prolonged QT When compared with ECG of 20-May-2023 21:46, No significant change was found  Echocardiogram 04/2024:  1. Visual appearance and strain pattern concerning for amyloid. Left  ventricular ejection fraction, by estimation, is 35 to 40%. The left  ventricle has moderately decreased function. The left ventricle  demonstrates global hypokinesis. There is moderate  concentric left ventricular hypertrophy. Left ventricular diastolic  parameters are consistent with Grade II diastolic dysfunction   (pseudonormalization). The average left ventricular global longitudinal  strain is -9.2 %. The global longitudinal strain is  abnormal.   2. Right ventricular systolic function is low normal. The right  ventricular size is mildly enlarged. There is severely elevated pulmonary  artery systolic pressure.   3. Left atrial size was severely dilated.   4. Right atrial size was severely dilated.   5. A small pericardial effusion is present. The pericardial effusion is  circumferential. There is no evidence of cardiac tamponade.   6. The mitral valve is normal in structure. Mild mitral valve  regurgitation. No evidence of mitral stenosis.   7. Low flow based on stroke volume, so gradient may inaccurately predict  degree of stenosis. Mild stenosis by gradient, moderate by DI (0.28). The  aortic valve is tricuspid. There is moderate calcification of the aortic  valve. Aortic valve regurgitation   is not visualized. Mild to moderate aortic valve stenosis. Aortic valve  mean gradient measures 12.0 mmHg.   8. The inferior vena cava is dilated in size with <50% respiratory  variability, suggesting right atrial pressure of 15 mmHg.   Comparison(s): Prior images reviewed side by side. EF reduced compared to  prior. Appearance and LV strain concerning for amyloid. Mild to moderate  aortic stenosis given low stroke volume.   Labs 03/2024: Hb 9.1 Cr 4.22, Albumin  3.0   Labs 01/2024: Chol 97, TG 119, HDL 37, LDL 39 HbA1C 6.6% Hb 9.1 Cr 4.2  10/31/2023: Chol 95, TG 87, HDL 29, LDL 49  Risk Assessment/Calculations:  CHA2DS2-VASc Score = 4  This indicates a 4.8% annual risk of stroke. The patient's score is based upon: CHF History: 0 HTN History: 1 Diabetes History: 1 Stroke History: 0 Vascular Disease History: 1 Age Score: 1 Gender Score: 0     Physical Exam:   Physical Exam Vitals and nursing note reviewed.  Constitutional:      General: He is not in acute distress. Neck:      Vascular: No JVD.  Cardiovascular:     Rate and Rhythm: Normal rate and regular rhythm.     Heart sounds: Normal heart sounds. No murmur heard. Pulmonary:     Effort: Pulmonary effort is normal.     Breath sounds: Normal breath sounds. No wheezing or rales.  Musculoskeletal:     Comments: B/l BKA      VISIT DIAGNOSES:   ICD-10-CM   1. HFrEF (heart failure with reduced ejection fraction) (HCC)  I50.20     2. Paroxysmal atrial fibrillation (HCC)  I48.0 apixaban  (ELIQUIS ) 5 MG TABS tablet    3. Coronary artery disease of native artery of native heart with stable angina pectoris (HCC)  I25.118     4. PAD (peripheral artery disease) (HCC)  I73.9          ASSESSMENT AND PLAN: .    William Webb  is a 73 y.o. male with hypertension, hyperlipidemia, type 2 DM,  ESRD on HD, mod AS, PAF, CAD, PAD (s/p b/l BKA), anemia, HFrEF  HFrEF: New diagnosis.  Clinically, appears euvolemic. Start Entresto  24-26 mg twice daily. Continue Coreg  6.25 mg twice daily. Follow-up with Pharm.D. for GDMT up titration. Not starting SGLT2i given that patient is on dialysis. Given known CAD, I would obtain PET/CT stress test to evaluate for ischemic etiology for HFrEF.   Additionally, there is concern that HFrEF could be related to amyloidosis, especially in the presence of aortic stenosis. Refer to heart failure clinic for possible cardiac amyloidosis.  PAD: B/l BKA. Continue rehab, Once he starts Eliquis , I would recommend stopping aspirin .   Lipids well controlled.  Aortic stenosis: Moderate. Suspect that this could be related to cardiac amyloidosis.  PAF: CHA2DS2VASc score 4, annual stroke risk 5%. Continue eliquis  5 mg bid.  Once he resumes Eliquis , would recommend stopping aspirin .  CAD: No angina symptoms. Lipids well controlled.   Hypertension: Controlled.  Anemia: Stable.  Follow-up with PCP/hematology.  Meds ordered this encounter  Medications   apixaban  (ELIQUIS )  5 MG TABS tablet    Sig: Take 1 tablet (5 mg total) by mouth 2 (two) times daily.    Dispense:  180 tablet    Refill:  3     F/u in 3 months  Signed, Newman JINNY Lawrence, MD

## 2024-06-16 NOTE — Telephone Encounter (Signed)
 Patient emergency contact person called and wanted to know if he can just be told what the result are when he comes for [place]? CB#[phone]

## 2024-06-16 NOTE — Addendum Note (Signed)
 Addended by: MANDA BOTTCHER B on: 06/16/2024 02:32 PM   Modules accepted: Orders

## 2024-06-16 NOTE — Patient Instructions (Signed)
 Medication Instructions:  STOP Aspirin  once starting eliquis    *If you need a refill on your cardiac medications before your next appointment, please call your pharmacy*   Follow-Up: At [facility], you and your health needs are our priority.  As part of our continuing mission to provide you with exceptional heart care, our providers are all part of one team.  This team includes your primary Cardiologist (physician) and Advanced Practice Providers or APPs (Physician Assistants and Nurse Practitioners) who all work together to provide you with the care you need, when you need it.  KEEP PHARM-D APPOINTMENT AS SCHEDULED   Your next appointment:   3 month(s)  Provider:   Newman JINNY Lawrence, MD

## 2024-06-16 NOTE — Telephone Encounter (Signed)
 Patient's nephew called stating he would like a return call with the results of his uncle's MRI done on 06/02/24.  Please call and advise at [phone].

## 2024-06-17 ENCOUNTER — Other Ambulatory Visit: Payer: Self-pay | Admitting: Physical Medicine and Rehabilitation

## 2024-06-17 ENCOUNTER — Telehealth: Payer: Self-pay | Admitting: Physical Medicine and Rehabilitation

## 2024-06-17 DIAGNOSIS — N2581 Secondary hyperparathyroidism of renal origin: Secondary | ICD-10-CM | POA: Diagnosis not present

## 2024-06-17 DIAGNOSIS — R202 Paresthesia of skin: Secondary | ICD-10-CM

## 2024-06-17 DIAGNOSIS — R110 Nausea: Secondary | ICD-10-CM | POA: Diagnosis not present

## 2024-06-17 DIAGNOSIS — Z992 Dependence on renal dialysis: Secondary | ICD-10-CM | POA: Diagnosis not present

## 2024-06-17 DIAGNOSIS — M62541 Muscle wasting and atrophy, not elsewhere classified, right hand: Secondary | ICD-10-CM

## 2024-06-17 DIAGNOSIS — R197 Diarrhea, unspecified: Secondary | ICD-10-CM | POA: Diagnosis not present

## 2024-06-17 DIAGNOSIS — D631 Anemia in chronic kidney disease: Secondary | ICD-10-CM | POA: Diagnosis not present

## 2024-06-17 DIAGNOSIS — E1129 Type 2 diabetes mellitus with other diabetic kidney complication: Secondary | ICD-10-CM | POA: Diagnosis not present

## 2024-06-17 DIAGNOSIS — N186 End stage renal disease: Secondary | ICD-10-CM | POA: Diagnosis not present

## 2024-06-17 DIAGNOSIS — D509 Iron deficiency anemia, unspecified: Secondary | ICD-10-CM | POA: Diagnosis not present

## 2024-06-17 DIAGNOSIS — M542 Cervicalgia: Secondary | ICD-10-CM

## 2024-06-17 NOTE — Telephone Encounter (Signed)
 Pt called requesting a call from Megan. Pt states he has a meeting with Megan next week and wasn't to come tomorrow. Explained to pt he has an appt and not a meeting and it is for a MRI review. Explained [facility] has no openings tomorrow and would add to cancellation list. Pt still asked for a call from Megan. Pt number is [phone].

## 2024-06-18 ENCOUNTER — Encounter: Payer: Self-pay | Admitting: Physician Assistant

## 2024-06-18 ENCOUNTER — Ambulatory Visit: Admitting: Physician Assistant

## 2024-06-18 DIAGNOSIS — Z89512 Acquired absence of left leg below knee: Secondary | ICD-10-CM

## 2024-06-18 DIAGNOSIS — Z89511 Acquired absence of right leg below knee: Secondary | ICD-10-CM
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
## 2024-06-19 DIAGNOSIS — R197 Diarrhea, unspecified: Secondary | ICD-10-CM | POA: Diagnosis not present

## 2024-06-19 DIAGNOSIS — N2581 Secondary hyperparathyroidism of renal origin: Secondary | ICD-10-CM | POA: Diagnosis not present

## 2024-06-19 DIAGNOSIS — E1129 Type 2 diabetes mellitus with other diabetic kidney complication: Secondary | ICD-10-CM | POA: Diagnosis not present

## 2024-06-19 DIAGNOSIS — R110 Nausea: Secondary | ICD-10-CM | POA: Diagnosis not present

## 2024-06-19 DIAGNOSIS — Z992 Dependence on renal dialysis: Secondary | ICD-10-CM | POA: Diagnosis not present

## 2024-06-19 DIAGNOSIS — D631 Anemia in chronic kidney disease: Secondary | ICD-10-CM | POA: Diagnosis not present

## 2024-06-19 DIAGNOSIS — D509 Iron deficiency anemia, unspecified: Secondary | ICD-10-CM | POA: Diagnosis not present

## 2024-06-19 DIAGNOSIS — N186 End stage renal disease: Secondary | ICD-10-CM | POA: Diagnosis not present

## 2024-06-22 DIAGNOSIS — E1129 Type 2 diabetes mellitus with other diabetic kidney complication: Secondary | ICD-10-CM | POA: Diagnosis not present

## 2024-06-22 DIAGNOSIS — N2581 Secondary hyperparathyroidism of renal origin: Secondary | ICD-10-CM | POA: Diagnosis not present

## 2024-06-22 DIAGNOSIS — D631 Anemia in chronic kidney disease: Secondary | ICD-10-CM | POA: Diagnosis not present

## 2024-06-22 DIAGNOSIS — N186 End stage renal disease: Secondary | ICD-10-CM | POA: Diagnosis not present

## 2024-06-22 DIAGNOSIS — Z992 Dependence on renal dialysis: Secondary | ICD-10-CM | POA: Diagnosis not present

## 2024-06-22 DIAGNOSIS — R197 Diarrhea, unspecified: Secondary | ICD-10-CM | POA: Diagnosis not present

## 2024-06-22 DIAGNOSIS — R110 Nausea: Secondary | ICD-10-CM | POA: Diagnosis not present

## 2024-06-22 DIAGNOSIS — D509 Iron deficiency anemia, unspecified: Secondary | ICD-10-CM | POA: Diagnosis not present

## 2024-06-23 ENCOUNTER — Telehealth: Payer: Self-pay | Admitting: Cardiology

## 2024-06-23 ENCOUNTER — Encounter: Payer: Self-pay | Admitting: Neurology

## 2024-06-23 DIAGNOSIS — M1990 Unspecified osteoarthritis, unspecified site: Secondary | ICD-10-CM | POA: Diagnosis not present

## 2024-06-23 DIAGNOSIS — E1142 Type 2 diabetes mellitus with diabetic polyneuropathy: Secondary | ICD-10-CM | POA: Diagnosis not present

## 2024-06-23 DIAGNOSIS — Z7901 Long term (current) use of anticoagulants: Secondary | ICD-10-CM | POA: Diagnosis not present

## 2024-06-23 DIAGNOSIS — H269 Unspecified cataract: Secondary | ICD-10-CM | POA: Diagnosis not present

## 2024-06-23 DIAGNOSIS — Z89512 Acquired absence of left leg below knee: Secondary | ICD-10-CM | POA: Diagnosis not present

## 2024-06-23 DIAGNOSIS — Z993 Dependence on wheelchair: Secondary | ICD-10-CM | POA: Diagnosis not present

## 2024-06-23 DIAGNOSIS — I509 Heart failure, unspecified: Secondary | ICD-10-CM | POA: Diagnosis not present

## 2024-06-23 DIAGNOSIS — E1151 Type 2 diabetes mellitus with diabetic peripheral angiopathy without gangrene: Secondary | ICD-10-CM | POA: Diagnosis not present

## 2024-06-23 DIAGNOSIS — R5381 Other malaise: Secondary | ICD-10-CM | POA: Diagnosis not present

## 2024-06-23 DIAGNOSIS — N186 End stage renal disease: Secondary | ICD-10-CM | POA: Diagnosis not present

## 2024-06-23 DIAGNOSIS — M797 Fibromyalgia: Secondary | ICD-10-CM | POA: Diagnosis not present

## 2024-06-23 DIAGNOSIS — E46 Unspecified protein-calorie malnutrition: Secondary | ICD-10-CM | POA: Diagnosis not present

## 2024-06-23 DIAGNOSIS — H409 Unspecified glaucoma: Secondary | ICD-10-CM | POA: Diagnosis not present

## 2024-06-23 DIAGNOSIS — Z992 Dependence on renal dialysis: Secondary | ICD-10-CM | POA: Diagnosis not present

## 2024-06-23 DIAGNOSIS — I480 Paroxysmal atrial fibrillation: Secondary | ICD-10-CM | POA: Diagnosis not present

## 2024-06-23 DIAGNOSIS — Z89429 Acquired absence of other toe(s), unspecified side: Secondary | ICD-10-CM | POA: Diagnosis not present

## 2024-06-23 DIAGNOSIS — I132 Hypertensive heart and chronic kidney disease with heart failure and with stage 5 chronic kidney disease, or end stage renal disease: Secondary | ICD-10-CM | POA: Diagnosis not present

## 2024-06-23 NOTE — Telephone Encounter (Signed)
 Patient's daughter called wanting to know if it would be okay for her father to go two day later than the week mark to get lab work done.  She states he was to get lab work done a week from starting Entresto , please advise.

## 2024-06-23 NOTE — Telephone Encounter (Signed)
 Returned call - informed that getting lab work 2 days later than one week will be fine. She verbalized understanding.

## 2024-06-24 DIAGNOSIS — N2581 Secondary hyperparathyroidism of renal origin: Secondary | ICD-10-CM | POA: Diagnosis not present

## 2024-06-24 DIAGNOSIS — D631 Anemia in chronic kidney disease: Secondary | ICD-10-CM | POA: Diagnosis not present

## 2024-06-24 DIAGNOSIS — R197 Diarrhea, unspecified: Secondary | ICD-10-CM | POA: Diagnosis not present

## 2024-06-24 DIAGNOSIS — N186 End stage renal disease: Secondary | ICD-10-CM | POA: Diagnosis not present

## 2024-06-24 DIAGNOSIS — R110 Nausea: Secondary | ICD-10-CM | POA: Diagnosis not present

## 2024-06-24 DIAGNOSIS — D509 Iron deficiency anemia, unspecified: Secondary | ICD-10-CM | POA: Diagnosis not present

## 2024-06-24 DIAGNOSIS — Z992 Dependence on renal dialysis: Secondary | ICD-10-CM | POA: Diagnosis not present

## 2024-06-24 DIAGNOSIS — E1129 Type 2 diabetes mellitus with other diabetic kidney complication: Secondary | ICD-10-CM | POA: Diagnosis not present

## 2024-06-25 ENCOUNTER — Ambulatory Visit: Admitting: Physical Medicine and Rehabilitation

## 2024-06-26 DIAGNOSIS — N2581 Secondary hyperparathyroidism of renal origin: Secondary | ICD-10-CM | POA: Diagnosis not present

## 2024-06-26 DIAGNOSIS — Z992 Dependence on renal dialysis: Secondary | ICD-10-CM | POA: Diagnosis not present

## 2024-06-26 DIAGNOSIS — R110 Nausea: Secondary | ICD-10-CM | POA: Diagnosis not present

## 2024-06-26 DIAGNOSIS — E1129 Type 2 diabetes mellitus with other diabetic kidney complication: Secondary | ICD-10-CM | POA: Diagnosis not present

## 2024-06-26 DIAGNOSIS — N186 End stage renal disease: Secondary | ICD-10-CM | POA: Diagnosis not present

## 2024-06-26 DIAGNOSIS — D509 Iron deficiency anemia, unspecified: Secondary | ICD-10-CM | POA: Diagnosis not present

## 2024-06-26 DIAGNOSIS — R197 Diarrhea, unspecified: Secondary | ICD-10-CM | POA: Diagnosis not present

## 2024-06-26 DIAGNOSIS — D631 Anemia in chronic kidney disease: Secondary | ICD-10-CM | POA: Diagnosis not present

## 2024-06-28 DIAGNOSIS — N186 End stage renal disease: Secondary | ICD-10-CM | POA: Diagnosis not present

## 2024-06-28 DIAGNOSIS — Z992 Dependence on renal dialysis: Secondary | ICD-10-CM | POA: Diagnosis not present

## 2024-06-28 DIAGNOSIS — E1129 Type 2 diabetes mellitus with other diabetic kidney complication: Secondary | ICD-10-CM | POA: Diagnosis not present

## 2024-06-29 DIAGNOSIS — N2581 Secondary hyperparathyroidism of renal origin: Secondary | ICD-10-CM | POA: Diagnosis not present

## 2024-06-29 DIAGNOSIS — E1129 Type 2 diabetes mellitus with other diabetic kidney complication: Secondary | ICD-10-CM | POA: Diagnosis not present

## 2024-06-29 DIAGNOSIS — D631 Anemia in chronic kidney disease: Secondary | ICD-10-CM | POA: Diagnosis not present

## 2024-06-29 DIAGNOSIS — Z992 Dependence on renal dialysis: Secondary | ICD-10-CM | POA: Diagnosis not present

## 2024-06-29 DIAGNOSIS — N186 End stage renal disease: Secondary | ICD-10-CM | POA: Diagnosis not present

## 2024-06-29 DIAGNOSIS — D509 Iron deficiency anemia, unspecified: Secondary | ICD-10-CM | POA: Diagnosis not present

## 2024-06-29 DIAGNOSIS — R197 Diarrhea, unspecified: Secondary | ICD-10-CM | POA: Diagnosis not present

## 2024-06-30 DIAGNOSIS — M6281 Muscle weakness (generalized): Secondary | ICD-10-CM | POA: Diagnosis not present

## 2024-06-30 DIAGNOSIS — Z89511 Acquired absence of right leg below knee: Secondary | ICD-10-CM | POA: Diagnosis not present

## 2024-06-30 DIAGNOSIS — R5381 Other malaise: Secondary | ICD-10-CM | POA: Diagnosis not present

## 2024-06-30 DIAGNOSIS — E1142 Type 2 diabetes mellitus with diabetic polyneuropathy: Secondary | ICD-10-CM | POA: Diagnosis not present

## 2024-06-30 DIAGNOSIS — I509 Heart failure, unspecified: Secondary | ICD-10-CM | POA: Diagnosis not present

## 2024-06-30 DIAGNOSIS — E1151 Type 2 diabetes mellitus with diabetic peripheral angiopathy without gangrene: Secondary | ICD-10-CM | POA: Diagnosis not present

## 2024-06-30 DIAGNOSIS — Z89512 Acquired absence of left leg below knee: Secondary | ICD-10-CM | POA: Diagnosis not present

## 2024-06-30 DIAGNOSIS — H409 Unspecified glaucoma: Secondary | ICD-10-CM | POA: Diagnosis not present

## 2024-06-30 DIAGNOSIS — H269 Unspecified cataract: Secondary | ICD-10-CM | POA: Diagnosis not present

## 2024-06-30 DIAGNOSIS — M1990 Unspecified osteoarthritis, unspecified site: Secondary | ICD-10-CM | POA: Diagnosis not present

## 2024-06-30 DIAGNOSIS — Z96659 Presence of unspecified artificial knee joint: Secondary | ICD-10-CM | POA: Diagnosis not present

## 2024-06-30 DIAGNOSIS — N186 End stage renal disease: Secondary | ICD-10-CM | POA: Diagnosis not present

## 2024-06-30 DIAGNOSIS — M797 Fibromyalgia: Secondary | ICD-10-CM | POA: Diagnosis not present

## 2024-06-30 DIAGNOSIS — I480 Paroxysmal atrial fibrillation: Secondary | ICD-10-CM | POA: Diagnosis not present

## 2024-06-30 DIAGNOSIS — Z993 Dependence on wheelchair: Secondary | ICD-10-CM | POA: Diagnosis not present

## 2024-06-30 DIAGNOSIS — Z7982 Long term (current) use of aspirin: Secondary | ICD-10-CM | POA: Diagnosis not present

## 2024-06-30 DIAGNOSIS — Z992 Dependence on renal dialysis: Secondary | ICD-10-CM | POA: Diagnosis not present

## 2024-06-30 DIAGNOSIS — I2510 Atherosclerotic heart disease of native coronary artery without angina pectoris: Secondary | ICD-10-CM | POA: Diagnosis not present

## 2024-06-30 DIAGNOSIS — E785 Hyperlipidemia, unspecified: Secondary | ICD-10-CM | POA: Diagnosis not present

## 2024-06-30 DIAGNOSIS — E46 Unspecified protein-calorie malnutrition: Secondary | ICD-10-CM | POA: Diagnosis not present

## 2024-06-30 DIAGNOSIS — I132 Hypertensive heart and chronic kidney disease with heart failure and with stage 5 chronic kidney disease, or end stage renal disease: Secondary | ICD-10-CM | POA: Diagnosis not present

## 2024-06-30 DIAGNOSIS — E1122 Type 2 diabetes mellitus with diabetic chronic kidney disease: Secondary | ICD-10-CM | POA: Diagnosis not present

## 2024-06-30 DIAGNOSIS — Z89429 Acquired absence of other toe(s), unspecified side: Secondary | ICD-10-CM | POA: Diagnosis not present

## 2024-06-30 DIAGNOSIS — I252 Old myocardial infarction: Secondary | ICD-10-CM | POA: Diagnosis not present

## 2024-06-30 DIAGNOSIS — Z7901 Long term (current) use of anticoagulants: Secondary | ICD-10-CM | POA: Diagnosis not present

## 2024-07-01 DIAGNOSIS — D509 Iron deficiency anemia, unspecified: Secondary | ICD-10-CM | POA: Diagnosis not present

## 2024-07-01 DIAGNOSIS — D631 Anemia in chronic kidney disease: Secondary | ICD-10-CM | POA: Diagnosis not present

## 2024-07-01 DIAGNOSIS — R197 Diarrhea, unspecified: Secondary | ICD-10-CM | POA: Diagnosis not present

## 2024-07-01 DIAGNOSIS — N186 End stage renal disease: Secondary | ICD-10-CM | POA: Diagnosis not present

## 2024-07-01 DIAGNOSIS — E1129 Type 2 diabetes mellitus with other diabetic kidney complication: Secondary | ICD-10-CM | POA: Diagnosis not present

## 2024-07-01 DIAGNOSIS — Z992 Dependence on renal dialysis: Secondary | ICD-10-CM | POA: Diagnosis not present

## 2024-07-01 DIAGNOSIS — N2581 Secondary hyperparathyroidism of renal origin: Secondary | ICD-10-CM | POA: Diagnosis not present

## 2024-07-02 ENCOUNTER — Other Ambulatory Visit: Payer: Self-pay

## 2024-07-02 ENCOUNTER — Encounter (HOSPITAL_COMMUNITY)
Admission: RE | Disposition: A | Discharge status: Home / Self Care | Payer: Self-pay | Source: Home / Self Care | Attending: Surgery

## 2024-07-02 ENCOUNTER — Ambulatory Visit (HOSPITAL_COMMUNITY)
Admission: RE | Admit: 2024-07-02 | Discharge: 2024-07-02 | Disposition: A | Discharge status: Home / Self Care | Attending: Surgery | Admitting: Surgery

## 2024-07-02 DIAGNOSIS — E1151 Type 2 diabetes mellitus with diabetic peripheral angiopathy without gangrene: Secondary | ICD-10-CM | POA: Diagnosis not present

## 2024-07-02 DIAGNOSIS — I509 Heart failure, unspecified: Secondary | ICD-10-CM | POA: Diagnosis not present

## 2024-07-02 DIAGNOSIS — T82858A Stenosis of vascular prosthetic devices, implants and grafts, initial encounter: Secondary | ICD-10-CM | POA: Diagnosis not present

## 2024-07-02 DIAGNOSIS — Z992 Dependence on renal dialysis: Secondary | ICD-10-CM | POA: Insufficient documentation

## 2024-07-02 DIAGNOSIS — T82838A Hemorrhage of vascular prosthetic devices, implants and grafts, initial encounter: Secondary | ICD-10-CM | POA: Diagnosis not present

## 2024-07-02 DIAGNOSIS — E1136 Type 2 diabetes mellitus with diabetic cataract: Secondary | ICD-10-CM | POA: Diagnosis not present

## 2024-07-02 DIAGNOSIS — I480 Paroxysmal atrial fibrillation: Secondary | ICD-10-CM | POA: Diagnosis not present

## 2024-07-02 DIAGNOSIS — I2510 Atherosclerotic heart disease of native coronary artery without angina pectoris: Secondary | ICD-10-CM | POA: Diagnosis not present

## 2024-07-02 DIAGNOSIS — Y832 Surgical operation with anastomosis, bypass or graft as the cause of abnormal reaction of the patient, or of later complication, without mention of misadventure at the time of the procedure: Secondary | ICD-10-CM | POA: Diagnosis not present

## 2024-07-02 DIAGNOSIS — E785 Hyperlipidemia, unspecified: Secondary | ICD-10-CM | POA: Diagnosis not present

## 2024-07-02 DIAGNOSIS — M797 Fibromyalgia: Secondary | ICD-10-CM | POA: Diagnosis not present

## 2024-07-02 DIAGNOSIS — E46 Unspecified protein-calorie malnutrition: Secondary | ICD-10-CM | POA: Diagnosis not present

## 2024-07-02 DIAGNOSIS — E1142 Type 2 diabetes mellitus with diabetic polyneuropathy: Secondary | ICD-10-CM | POA: Diagnosis not present

## 2024-07-02 DIAGNOSIS — E1122 Type 2 diabetes mellitus with diabetic chronic kidney disease: Secondary | ICD-10-CM | POA: Diagnosis not present

## 2024-07-02 DIAGNOSIS — N186 End stage renal disease: Secondary | ICD-10-CM | POA: Insufficient documentation

## 2024-07-02 DIAGNOSIS — I132 Hypertensive heart and chronic kidney disease with heart failure and with stage 5 chronic kidney disease, or end stage renal disease: Secondary | ICD-10-CM | POA: Diagnosis not present

## 2024-07-02 HISTORY — PX: A/V SHUNT INTERVENTION: CATH118220

## 2024-07-02 HISTORY — PX: VENOUS ANGIOPLASTY: CATH118376

## 2024-07-02 LAB — GLUCOSE, CAPILLARY: Glucose-Capillary: 88 mg/dL (ref 70–99)

## 2024-07-02 SURGERY — A/V SHUNT INTERVENTION
Anesthesia: LOCAL | Site: Arm Upper | Laterality: Left

## 2024-07-02 MED ORDER — LIDOCAINE HCL (PF) 1 % IJ SOLN
INTRAMUSCULAR | Status: DC | PRN
Start: 2024-07-02 — End: 2024-07-02
  Administered 2024-07-02: 5 mL

## 2024-07-02 MED ORDER — LIDOCAINE HCL (PF) 1 % IJ SOLN
INTRAMUSCULAR | Status: AC
  Filled 2024-07-02: qty 30

## 2024-07-02 MED ORDER — IODIXANOL 320 MG/ML IV SOLN
INTRAVENOUS | Status: DC | PRN
  Administered 2024-07-02: 30 mL via INTRAVENOUS

## 2024-07-02 MED ORDER — HEPARIN (PORCINE) IN NACL 1000-0.9 UT/500ML-% IV SOLN
INTRAVENOUS | Status: DC | PRN
  Administered 2024-07-02: 500 mL

## 2024-07-02 SURGICAL SUPPLY — 9 items
BALLOON MUSTANG 9X80X75 (BALLOONS) IMPLANT
KIT ENCORE 26 ADVANTAGE (KITS) IMPLANT
KIT MICROPUNCTURE NIT STIFF (SHEATH) IMPLANT
SHEATH PINNACLE R/O II 6F 4CM (SHEATH) IMPLANT
SHEATH PROBE COVER 6X72 (BAG) IMPLANT
STOPCOCK MORSE 400PSI 3WAY (MISCELLANEOUS) IMPLANT
TRAY PV CATH (CUSTOM PROCEDURE TRAY) ×2 IMPLANT
TUBING CIL FLEX 10 FLL-RA (TUBING) IMPLANT
WIRE BENTSON .035X145CM (WIRE) IMPLANT

## 2024-07-02 NOTE — Op Note (Signed)
    Patient name: William Webb  MRN: [id number] DOB: [birth year] Sex: male  07/02/2024 Pre-operative Diagnosis: ESRD Post-operative diagnosis:  Same Surgeon:  Malvina New Procedure Performed:  1.  Ultrasound-guided access, left arm fistula  2.  Fistulogram  3.  Venoplasty, left cephalic vein (peripheral)  Indications: This is a 73 year old gentleman who is having prolonged bleeding after dialysis.  He comes in today for fistulogram.  Procedure:  The patient was identified in the holding area and taken to room 8.  The patient was then placed supine on the table and prepped and draped in the usual sterile fashion.  A time out was called.  Uultrasound was used to evaluate the fistula.  The vein was patent and compressible.  A digital ultrasound image was acquired.  The fistula was then accessed under ultrasound guidance using a micropuncture needle.  An 018 wire was then asvanced without resistance and a micropuncture sheath was placed.  Contrast injections were then performed through the sheath.  Findings: No evidence of central venous stenosis.  The arteriovenous anastomosis is widely patent.  In the midportion of the fistula there is recurrence of a stenosis associated with a valve.   Intervention: After the above images were acquired the decision was made to proceed with intervention.  A 6 French sheath was placed over a Bentson wire.  I selected a 9 mm Mustang balloon and perform balloon venoplasty across the area of concern.  Completion imaging revealed resolution of the stenosis.  The sheath was removed and the access site closed with a Monocryl  Impression:  #1  Successful venoplasty of a mid fistula stenosis  #2  Access remains amenable to percutaneous intervention   V. Malvina New, M.D., [facility] [place] Office: [phone] Pager:  [phone]

## 2024-07-02 NOTE — H&P (Signed)
   Patient name: William Webb  MRN: [id number] DOB: 03-29-1951 Sex: male  REASON FOR VISIT:    Bleeding after dialysis  HISTORY OF PRESENT ILLNESS:   William Webb  is a 73 y.o. male with a left upper arm fistula who is having recurrent issues prolonged bleeding after decannulation.  He underwent a fistulogram several months ago and had angioplasty of a stenotic valve with a 9 mm balloon  CURRENT MEDICATIONS:    No current facility-administered medications for this encounter.    REVIEW OF SYSTEMS:   [X]  denotes positive finding, [ ]  denotes negative finding Cardiac  Comments:  Chest pain or chest pressure:    Shortness of breath upon exertion:    Short of breath when lying flat:    Irregular heart rhythm:    Constitutional    Fever or chills:      PHYSICAL EXAM:   There were no vitals filed for this visit.  GENERAL: The patient is a well-nourished male, in no acute distress. The vital signs are documented above. CARDIOVASCULAR: There is a regular rate and rhythm. PULMONARY: Non-labored respirations Aneurysmal fistula which is somewhat pulsatile  STUDIES:      MEDICAL ISSUES:   ESRD: I discussed proceeding with fistulogram to better evaluate for a stenosis and to intervene if indicated.  Details of the procedure discussed with the patient.  All questions were answered.  Malvina Serene CLORE, MD, FACS [facility] [phone] Pager [phone]

## 2024-07-03 ENCOUNTER — Encounter (HOSPITAL_COMMUNITY): Payer: Self-pay | Admitting: Surgery

## 2024-07-03 DIAGNOSIS — D509 Iron deficiency anemia, unspecified: Secondary | ICD-10-CM | POA: Diagnosis not present

## 2024-07-03 DIAGNOSIS — R197 Diarrhea, unspecified: Secondary | ICD-10-CM | POA: Diagnosis not present

## 2024-07-03 DIAGNOSIS — Z992 Dependence on renal dialysis: Secondary | ICD-10-CM | POA: Diagnosis not present

## 2024-07-03 DIAGNOSIS — N186 End stage renal disease: Secondary | ICD-10-CM | POA: Diagnosis not present

## 2024-07-03 DIAGNOSIS — N2581 Secondary hyperparathyroidism of renal origin: Secondary | ICD-10-CM | POA: Diagnosis not present

## 2024-07-03 DIAGNOSIS — D631 Anemia in chronic kidney disease: Secondary | ICD-10-CM | POA: Diagnosis not present

## 2024-07-03 DIAGNOSIS — E1129 Type 2 diabetes mellitus with other diabetic kidney complication: Secondary | ICD-10-CM | POA: Diagnosis not present

## 2024-07-06 DIAGNOSIS — D509 Iron deficiency anemia, unspecified: Secondary | ICD-10-CM | POA: Diagnosis not present

## 2024-07-06 DIAGNOSIS — Z992 Dependence on renal dialysis: Secondary | ICD-10-CM | POA: Diagnosis not present

## 2024-07-06 DIAGNOSIS — N186 End stage renal disease: Secondary | ICD-10-CM | POA: Diagnosis not present

## 2024-07-06 DIAGNOSIS — D631 Anemia in chronic kidney disease: Secondary | ICD-10-CM | POA: Diagnosis not present

## 2024-07-06 DIAGNOSIS — R197 Diarrhea, unspecified: Secondary | ICD-10-CM | POA: Diagnosis not present

## 2024-07-06 DIAGNOSIS — N2581 Secondary hyperparathyroidism of renal origin: Secondary | ICD-10-CM | POA: Diagnosis not present

## 2024-07-06 DIAGNOSIS — E1129 Type 2 diabetes mellitus with other diabetic kidney complication: Secondary | ICD-10-CM | POA: Diagnosis not present

## 2024-07-07 ENCOUNTER — Encounter: Payer: Self-pay | Admitting: Pharmacist

## 2024-07-07 ENCOUNTER — Ambulatory Visit: Attending: Cardiology | Admitting: Pharmacist

## 2024-07-07 VITALS — BP 124/66 | HR 61

## 2024-07-07 DIAGNOSIS — I5020 Unspecified systolic (congestive) heart failure: Secondary | ICD-10-CM | POA: Diagnosis not present

## 2024-07-07 DIAGNOSIS — I2510 Atherosclerotic heart disease of native coronary artery without angina pectoris: Secondary | ICD-10-CM | POA: Diagnosis not present

## 2024-07-07 DIAGNOSIS — I739 Peripheral vascular disease, unspecified: Secondary | ICD-10-CM | POA: Diagnosis not present

## 2024-07-07 DIAGNOSIS — I10 Essential (primary) hypertension: Secondary | ICD-10-CM | POA: Diagnosis not present
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
## 2024-07-07 NOTE — Patient Instructions (Addendum)
 Nice meeting you two today  We would like your blood pressure to stay less than 130/80  Please continue your: Carvedilol  6.25mg  twice a day Entresto  24-26mg  twice a day Furosemide  80mg    Try to watch how much salt and sugar you are eating  We will update your lab work and contact you with the results  Medford Bolk, PharmD, BCACP, CDCES, CPP [facility] [address] [place], [place] [address] Phone: [phone]; Fax: [phone] 07/07/2024 3:13 PM

## 2024-07-08 ENCOUNTER — Ambulatory Visit: Payer: Self-pay

## 2024-07-08 ENCOUNTER — Other Ambulatory Visit (HOSPITAL_COMMUNITY): Payer: Self-pay

## 2024-07-08 DIAGNOSIS — D631 Anemia in chronic kidney disease: Secondary | ICD-10-CM | POA: Diagnosis not present

## 2024-07-08 DIAGNOSIS — D509 Iron deficiency anemia, unspecified: Secondary | ICD-10-CM | POA: Diagnosis not present

## 2024-07-08 DIAGNOSIS — Z992 Dependence on renal dialysis: Secondary | ICD-10-CM | POA: Diagnosis not present

## 2024-07-08 DIAGNOSIS — N2581 Secondary hyperparathyroidism of renal origin: Secondary | ICD-10-CM | POA: Diagnosis not present

## 2024-07-08 DIAGNOSIS — R197 Diarrhea, unspecified: Secondary | ICD-10-CM | POA: Diagnosis not present

## 2024-07-08 DIAGNOSIS — E1129 Type 2 diabetes mellitus with other diabetic kidney complication: Secondary | ICD-10-CM | POA: Diagnosis not present

## 2024-07-08 DIAGNOSIS — N186 End stage renal disease: Secondary | ICD-10-CM | POA: Diagnosis not present

## 2024-07-08 LAB — LIPID PANEL
Chol/HDL Ratio: 2.1 ratio (ref 0.0–5.0)
Cholesterol, Total: 109 mg/dL (ref 100–199)
HDL: 53 mg/dL (ref >39)
LDL Chol Calc (NIH): 45 mg/dL (ref 0–99)
Triglycerides: 45 mg/dL (ref 0–149)
VLDL Cholesterol Cal: 11 mg/dL (ref 5–40)

## 2024-07-08 LAB — BASIC METABOLIC PANEL WITH GFR
BUN/Creatinine Ratio: 7 — ABNORMAL LOW (ref 10–24)
BUN: 32 mg/dL — ABNORMAL HIGH (ref 8–27)
CO2: 25 mmol/L (ref 20–29)
Calcium: 8.7 mg/dL (ref 8.6–10.2)
Chloride: 98 mmol/L (ref 96–106)
Creatinine, Ser: 4.66 mg/dL — ABNORMAL HIGH (ref 0.76–1.27)
Glucose: 115 mg/dL — ABNORMAL HIGH (ref 70–99)
Potassium: 4.9 mmol/L (ref 3.5–5.2)
Sodium: 141 mmol/L (ref 134–144)
eGFR: 12 mL/min/1.73 — ABNORMAL LOW (ref >59)

## 2024-07-08 LAB — PRO B NATRIURETIC PEPTIDE: NT-Pro BNP: >70000 pg/mL — ABNORMAL HIGH (ref 0–376)

## 2024-07-08 MED ORDER — SACUBITRIL-VALSARTAN 49-51 MG PO TABS
1.0000 | ORAL_TABLET | Freq: Two times a day (BID) | ORAL | 5 refills | Status: DC
  Filled 2024-07-08: qty 60, 30d supply, fill #0

## 2024-07-08 NOTE — Progress Notes (Signed)
 New diagnosis heart failure, currently on low-dose Entresto  and carvedilol . proBNP is very high, he is on dialysis. Medford, what are your thoughts about increasing Entresto  dose, I know you saw him yesterday.  Thanks MJP

## 2024-07-08 NOTE — Progress Notes (Signed)
 I agree.   Thanks MJP

## 2024-07-08 NOTE — Progress Notes (Signed)
 Lipids are well-controlled great.  See separate messages regarding hypertension management.  Thanks MJP

## 2024-07-09 ENCOUNTER — Telehealth: Payer: Self-pay | Admitting: Pharmacist

## 2024-07-09 DIAGNOSIS — K219 Gastro-esophageal reflux disease without esophagitis: Secondary | ICD-10-CM | POA: Diagnosis not present

## 2024-07-09 DIAGNOSIS — Z89512 Acquired absence of left leg below knee: Secondary | ICD-10-CM | POA: Diagnosis not present

## 2024-07-09 DIAGNOSIS — Z7901 Long term (current) use of anticoagulants: Secondary | ICD-10-CM | POA: Diagnosis not present

## 2024-07-09 DIAGNOSIS — I509 Heart failure, unspecified: Secondary | ICD-10-CM | POA: Diagnosis not present

## 2024-07-09 DIAGNOSIS — I252 Old myocardial infarction: Secondary | ICD-10-CM | POA: Diagnosis not present

## 2024-07-09 DIAGNOSIS — M797 Fibromyalgia: Secondary | ICD-10-CM | POA: Diagnosis not present

## 2024-07-09 DIAGNOSIS — Z79899 Other long term (current) drug therapy: Secondary | ICD-10-CM | POA: Diagnosis not present

## 2024-07-09 DIAGNOSIS — E1151 Type 2 diabetes mellitus with diabetic peripheral angiopathy without gangrene: Secondary | ICD-10-CM | POA: Diagnosis not present

## 2024-07-09 DIAGNOSIS — E785 Hyperlipidemia, unspecified: Secondary | ICD-10-CM | POA: Diagnosis not present

## 2024-07-09 DIAGNOSIS — H409 Unspecified glaucoma: Secondary | ICD-10-CM | POA: Diagnosis not present

## 2024-07-09 DIAGNOSIS — I2510 Atherosclerotic heart disease of native coronary artery without angina pectoris: Secondary | ICD-10-CM | POA: Diagnosis not present

## 2024-07-09 DIAGNOSIS — Z89429 Acquired absence of other toe(s), unspecified side: Secondary | ICD-10-CM | POA: Diagnosis not present

## 2024-07-09 DIAGNOSIS — E1142 Type 2 diabetes mellitus with diabetic polyneuropathy: Secondary | ICD-10-CM | POA: Diagnosis not present

## 2024-07-09 DIAGNOSIS — I132 Hypertensive heart and chronic kidney disease with heart failure and with stage 5 chronic kidney disease, or end stage renal disease: Secondary | ICD-10-CM | POA: Diagnosis not present

## 2024-07-09 DIAGNOSIS — E46 Unspecified protein-calorie malnutrition: Secondary | ICD-10-CM | POA: Diagnosis not present

## 2024-07-09 DIAGNOSIS — N186 End stage renal disease: Secondary | ICD-10-CM | POA: Diagnosis not present

## 2024-07-09 DIAGNOSIS — Z7982 Long term (current) use of aspirin: Secondary | ICD-10-CM | POA: Diagnosis not present

## 2024-07-09 DIAGNOSIS — Z89511 Acquired absence of right leg below knee: Secondary | ICD-10-CM | POA: Diagnosis not present

## 2024-07-09 DIAGNOSIS — E1136 Type 2 diabetes mellitus with diabetic cataract: Secondary | ICD-10-CM | POA: Diagnosis not present

## 2024-07-09 DIAGNOSIS — M1990 Unspecified osteoarthritis, unspecified site: Secondary | ICD-10-CM | POA: Diagnosis not present

## 2024-07-09 DIAGNOSIS — I480 Paroxysmal atrial fibrillation: Secondary | ICD-10-CM | POA: Diagnosis not present

## 2024-07-09 DIAGNOSIS — Z96659 Presence of unspecified artificial knee joint: Secondary | ICD-10-CM | POA: Diagnosis not present

## 2024-07-09 DIAGNOSIS — E1122 Type 2 diabetes mellitus with diabetic chronic kidney disease: Secondary | ICD-10-CM | POA: Diagnosis not present

## 2024-07-09 DIAGNOSIS — Z992 Dependence on renal dialysis: Secondary | ICD-10-CM | POA: Diagnosis not present

## 2024-07-09 DIAGNOSIS — Z993 Dependence on wheelchair: Secondary | ICD-10-CM | POA: Diagnosis not present

## 2024-07-09 NOTE — Progress Notes (Signed)
 Medford, you able to talk to his daughter. Brooke, let us  get him in to see me sooner than 09/17/2024.  Thanks MJP

## 2024-07-09 NOTE — Telephone Encounter (Signed)
 Received message patient's daughter would like to speak with me.  Contacted daughter at [phone]. No answer, lmom

## 2024-07-10 DIAGNOSIS — D509 Iron deficiency anemia, unspecified: Secondary | ICD-10-CM | POA: Diagnosis not present

## 2024-07-10 DIAGNOSIS — E1129 Type 2 diabetes mellitus with other diabetic kidney complication: Secondary | ICD-10-CM | POA: Diagnosis not present

## 2024-07-10 DIAGNOSIS — Z992 Dependence on renal dialysis: Secondary | ICD-10-CM | POA: Diagnosis not present

## 2024-07-10 DIAGNOSIS — R197 Diarrhea, unspecified: Secondary | ICD-10-CM | POA: Diagnosis not present

## 2024-07-10 DIAGNOSIS — D631 Anemia in chronic kidney disease: Secondary | ICD-10-CM | POA: Diagnosis not present

## 2024-07-10 DIAGNOSIS — N2581 Secondary hyperparathyroidism of renal origin: Secondary | ICD-10-CM | POA: Diagnosis not present

## 2024-07-10 DIAGNOSIS — N186 End stage renal disease: Secondary | ICD-10-CM | POA: Diagnosis not present

## 2024-07-10 NOTE — Progress Notes (Signed)
 Will see him on 9/30 as we discussed.  Thanks MJP

## 2024-07-13 DIAGNOSIS — E1129 Type 2 diabetes mellitus with other diabetic kidney complication: Secondary | ICD-10-CM | POA: Diagnosis not present

## 2024-07-13 DIAGNOSIS — D509 Iron deficiency anemia, unspecified: Secondary | ICD-10-CM | POA: Diagnosis not present

## 2024-07-13 DIAGNOSIS — D631 Anemia in chronic kidney disease: Secondary | ICD-10-CM | POA: Diagnosis not present

## 2024-07-13 DIAGNOSIS — Z992 Dependence on renal dialysis: Secondary | ICD-10-CM | POA: Diagnosis not present

## 2024-07-13 DIAGNOSIS — N2581 Secondary hyperparathyroidism of renal origin: Secondary | ICD-10-CM | POA: Diagnosis not present

## 2024-07-13 DIAGNOSIS — R197 Diarrhea, unspecified: Secondary | ICD-10-CM | POA: Diagnosis not present

## 2024-07-13 DIAGNOSIS — N186 End stage renal disease: Secondary | ICD-10-CM | POA: Diagnosis not present

## 2024-07-14 DIAGNOSIS — E1122 Type 2 diabetes mellitus with diabetic chronic kidney disease: Secondary | ICD-10-CM | POA: Diagnosis not present

## 2024-07-14 DIAGNOSIS — I480 Paroxysmal atrial fibrillation: Secondary | ICD-10-CM | POA: Diagnosis not present

## 2024-07-14 DIAGNOSIS — K219 Gastro-esophageal reflux disease without esophagitis: Secondary | ICD-10-CM | POA: Diagnosis not present

## 2024-07-14 DIAGNOSIS — Z79899 Other long term (current) drug therapy: Secondary | ICD-10-CM | POA: Diagnosis not present

## 2024-07-14 DIAGNOSIS — Z96659 Presence of unspecified artificial knee joint: Secondary | ICD-10-CM | POA: Diagnosis not present

## 2024-07-14 DIAGNOSIS — I252 Old myocardial infarction: Secondary | ICD-10-CM | POA: Diagnosis not present

## 2024-07-14 DIAGNOSIS — Z89511 Acquired absence of right leg below knee: Secondary | ICD-10-CM | POA: Diagnosis not present

## 2024-07-14 DIAGNOSIS — H409 Unspecified glaucoma: Secondary | ICD-10-CM | POA: Diagnosis not present

## 2024-07-14 DIAGNOSIS — I2510 Atherosclerotic heart disease of native coronary artery without angina pectoris: Secondary | ICD-10-CM | POA: Diagnosis not present

## 2024-07-14 DIAGNOSIS — Z992 Dependence on renal dialysis: Secondary | ICD-10-CM | POA: Diagnosis not present

## 2024-07-14 DIAGNOSIS — E1142 Type 2 diabetes mellitus with diabetic polyneuropathy: Secondary | ICD-10-CM | POA: Diagnosis not present

## 2024-07-14 DIAGNOSIS — N186 End stage renal disease: Secondary | ICD-10-CM | POA: Diagnosis not present

## 2024-07-14 DIAGNOSIS — E46 Unspecified protein-calorie malnutrition: Secondary | ICD-10-CM | POA: Diagnosis not present

## 2024-07-14 DIAGNOSIS — I132 Hypertensive heart and chronic kidney disease with heart failure and with stage 5 chronic kidney disease, or end stage renal disease: Secondary | ICD-10-CM | POA: Diagnosis not present

## 2024-07-14 DIAGNOSIS — E785 Hyperlipidemia, unspecified: Secondary | ICD-10-CM | POA: Diagnosis not present

## 2024-07-14 DIAGNOSIS — E1151 Type 2 diabetes mellitus with diabetic peripheral angiopathy without gangrene: Secondary | ICD-10-CM | POA: Diagnosis not present

## 2024-07-14 DIAGNOSIS — M797 Fibromyalgia: Secondary | ICD-10-CM | POA: Diagnosis not present

## 2024-07-14 DIAGNOSIS — Z7901 Long term (current) use of anticoagulants: Secondary | ICD-10-CM | POA: Diagnosis not present

## 2024-07-14 DIAGNOSIS — M1990 Unspecified osteoarthritis, unspecified site: Secondary | ICD-10-CM | POA: Diagnosis not present

## 2024-07-14 DIAGNOSIS — Z993 Dependence on wheelchair: Secondary | ICD-10-CM | POA: Diagnosis not present

## 2024-07-14 DIAGNOSIS — E1136 Type 2 diabetes mellitus with diabetic cataract: Secondary | ICD-10-CM | POA: Diagnosis not present

## 2024-07-14 DIAGNOSIS — Z7982 Long term (current) use of aspirin: Secondary | ICD-10-CM | POA: Diagnosis not present

## 2024-07-14 DIAGNOSIS — Z89429 Acquired absence of other toe(s), unspecified side: Secondary | ICD-10-CM | POA: Diagnosis not present

## 2024-07-14 DIAGNOSIS — I509 Heart failure, unspecified: Secondary | ICD-10-CM | POA: Diagnosis not present

## 2024-07-14 DIAGNOSIS — Z89512 Acquired absence of left leg below knee: Secondary | ICD-10-CM | POA: Diagnosis not present

## 2024-07-15 DIAGNOSIS — Z992 Dependence on renal dialysis: Secondary | ICD-10-CM | POA: Diagnosis not present

## 2024-07-15 DIAGNOSIS — N186 End stage renal disease: Secondary | ICD-10-CM | POA: Diagnosis not present

## 2024-07-15 DIAGNOSIS — E1129 Type 2 diabetes mellitus with other diabetic kidney complication: Secondary | ICD-10-CM | POA: Diagnosis not present

## 2024-07-15 DIAGNOSIS — D631 Anemia in chronic kidney disease: Secondary | ICD-10-CM | POA: Diagnosis not present

## 2024-07-15 DIAGNOSIS — D509 Iron deficiency anemia, unspecified: Secondary | ICD-10-CM | POA: Diagnosis not present

## 2024-07-15 DIAGNOSIS — N2581 Secondary hyperparathyroidism of renal origin: Secondary | ICD-10-CM | POA: Diagnosis not present

## 2024-07-15 DIAGNOSIS — R197 Diarrhea, unspecified: Secondary | ICD-10-CM | POA: Diagnosis not present

## 2024-07-17 ENCOUNTER — Encounter (HOSPITAL_COMMUNITY): Payer: Self-pay

## 2024-07-17 DIAGNOSIS — E1129 Type 2 diabetes mellitus with other diabetic kidney complication: Secondary | ICD-10-CM | POA: Diagnosis not present

## 2024-07-17 DIAGNOSIS — D509 Iron deficiency anemia, unspecified: Secondary | ICD-10-CM | POA: Diagnosis not present

## 2024-07-17 DIAGNOSIS — Z992 Dependence on renal dialysis: Secondary | ICD-10-CM | POA: Diagnosis not present

## 2024-07-17 DIAGNOSIS — R197 Diarrhea, unspecified: Secondary | ICD-10-CM | POA: Diagnosis not present

## 2024-07-17 DIAGNOSIS — N186 End stage renal disease: Secondary | ICD-10-CM | POA: Diagnosis not present

## 2024-07-17 DIAGNOSIS — D631 Anemia in chronic kidney disease: Secondary | ICD-10-CM | POA: Diagnosis not present

## 2024-07-17 DIAGNOSIS — N2581 Secondary hyperparathyroidism of renal origin: Secondary | ICD-10-CM | POA: Diagnosis not present

## 2024-07-20 ENCOUNTER — Telehealth (HOSPITAL_COMMUNITY): Payer: Self-pay | Admitting: *Deleted

## 2024-07-20 DIAGNOSIS — E1129 Type 2 diabetes mellitus with other diabetic kidney complication: Secondary | ICD-10-CM | POA: Diagnosis not present

## 2024-07-20 DIAGNOSIS — N2581 Secondary hyperparathyroidism of renal origin: Secondary | ICD-10-CM | POA: Diagnosis not present

## 2024-07-20 DIAGNOSIS — D631 Anemia in chronic kidney disease: Secondary | ICD-10-CM | POA: Diagnosis not present

## 2024-07-20 DIAGNOSIS — N186 End stage renal disease: Secondary | ICD-10-CM | POA: Diagnosis not present

## 2024-07-20 DIAGNOSIS — R197 Diarrhea, unspecified: Secondary | ICD-10-CM | POA: Diagnosis not present

## 2024-07-20 DIAGNOSIS — D509 Iron deficiency anemia, unspecified: Secondary | ICD-10-CM | POA: Diagnosis not present

## 2024-07-20 DIAGNOSIS — Z992 Dependence on renal dialysis: Secondary | ICD-10-CM | POA: Diagnosis not present

## 2024-07-20 NOTE — Telephone Encounter (Signed)
 Reaching out to patient to offer assistance regarding upcoming cardiac imaging study; pt verbalizes understanding of appt date/time, parking situation and where to check in, pre-test NPO status and medications ordered, and verified current allergies; name and call back number provided for further questions should they arise Sid Seats RN Navigator Cardiac Imaging [facility] [phone] office [phone] cell  Spoke to daughter, aware to avoid caffeine for 12 hours prior to test and hold flomax .

## 2024-07-21 ENCOUNTER — Ambulatory Visit (HOSPITAL_COMMUNITY)
Admission: RE | Admit: 2024-07-21 | Discharge: 2024-07-21 | Disposition: A | Discharge status: Home / Self Care | Source: Ambulatory Visit | Attending: Cardiology | Admitting: Cardiology

## 2024-07-21 DIAGNOSIS — I2510 Atherosclerotic heart disease of native coronary artery without angina pectoris: Secondary | ICD-10-CM | POA: Diagnosis not present

## 2024-07-21 MED ORDER — RUBIDIUM RB82 GENERATOR (RUBYFILL)
25.0000 | PACK | Freq: Once | INTRAVENOUS | Status: AC
  Administered 2024-07-21: 17.84 via INTRAVENOUS

## 2024-07-21 MED ORDER — RUBIDIUM RB82 GENERATOR (RUBYFILL)
25.0000 | PACK | Freq: Once | INTRAVENOUS | Status: AC
  Administered 2024-07-21: 17.81 via INTRAVENOUS

## 2024-07-21 MED ORDER — REGADENOSON 0.4 MG/5ML IV SOLN
INTRAVENOUS | Status: AC
  Filled 2024-07-21: qty 5

## 2024-07-21 MED ORDER — REGADENOSON 0.4 MG/5ML IV SOLN
0.4000 mg | Freq: Once | INTRAVENOUS | Status: AC
  Administered 2024-07-21: 0.4 mg via INTRAVENOUS

## 2024-07-21 NOTE — Progress Notes (Signed)
 Pt tolerated lexiscan . Endorsed SOB throughout scan, resolved by end. Given PO caffeine for minor HA.

## 2024-07-22 DIAGNOSIS — D631 Anemia in chronic kidney disease: Secondary | ICD-10-CM | POA: Diagnosis not present

## 2024-07-22 DIAGNOSIS — D509 Iron deficiency anemia, unspecified: Secondary | ICD-10-CM | POA: Diagnosis not present

## 2024-07-22 DIAGNOSIS — R197 Diarrhea, unspecified: Secondary | ICD-10-CM | POA: Diagnosis not present

## 2024-07-22 DIAGNOSIS — N2581 Secondary hyperparathyroidism of renal origin: Secondary | ICD-10-CM | POA: Diagnosis not present

## 2024-07-22 DIAGNOSIS — E1129 Type 2 diabetes mellitus with other diabetic kidney complication: Secondary | ICD-10-CM | POA: Diagnosis not present

## 2024-07-22 DIAGNOSIS — N186 End stage renal disease: Secondary | ICD-10-CM | POA: Diagnosis not present

## 2024-07-22 DIAGNOSIS — Z992 Dependence on renal dialysis: Secondary | ICD-10-CM | POA: Diagnosis not present

## 2024-07-22 LAB — NM PET CT CARDIAC PERFUSION MULTI W/ABSOLUTE BLOODFLOW
Nuc Rest EF: 24 %
Nuc Stress EF: 32 %
Rest Nuclear Isotope Dose: 17.8 mCi
ST Depression (mm): 0 mm
Stress Nuclear Isotope Dose: 17.8 mCi
TID: 0.96

## 2024-07-23 DIAGNOSIS — E1122 Type 2 diabetes mellitus with diabetic chronic kidney disease: Secondary | ICD-10-CM | POA: Diagnosis not present

## 2024-07-23 DIAGNOSIS — Z993 Dependence on wheelchair: Secondary | ICD-10-CM | POA: Diagnosis not present

## 2024-07-23 DIAGNOSIS — I252 Old myocardial infarction: Secondary | ICD-10-CM | POA: Diagnosis not present

## 2024-07-23 DIAGNOSIS — Z89429 Acquired absence of other toe(s), unspecified side: Secondary | ICD-10-CM | POA: Diagnosis not present

## 2024-07-23 DIAGNOSIS — M797 Fibromyalgia: Secondary | ICD-10-CM | POA: Diagnosis not present

## 2024-07-23 DIAGNOSIS — Z992 Dependence on renal dialysis: Secondary | ICD-10-CM | POA: Diagnosis not present

## 2024-07-23 DIAGNOSIS — N186 End stage renal disease: Secondary | ICD-10-CM | POA: Diagnosis not present

## 2024-07-23 DIAGNOSIS — K219 Gastro-esophageal reflux disease without esophagitis: Secondary | ICD-10-CM | POA: Diagnosis not present

## 2024-07-23 DIAGNOSIS — E46 Unspecified protein-calorie malnutrition: Secondary | ICD-10-CM | POA: Diagnosis not present

## 2024-07-23 DIAGNOSIS — E1151 Type 2 diabetes mellitus with diabetic peripheral angiopathy without gangrene: Secondary | ICD-10-CM | POA: Diagnosis not present

## 2024-07-23 DIAGNOSIS — Z7982 Long term (current) use of aspirin: Secondary | ICD-10-CM | POA: Diagnosis not present

## 2024-07-23 DIAGNOSIS — Z79899 Other long term (current) drug therapy: Secondary | ICD-10-CM | POA: Diagnosis not present

## 2024-07-23 DIAGNOSIS — Z89511 Acquired absence of right leg below knee: Secondary | ICD-10-CM | POA: Diagnosis not present

## 2024-07-23 DIAGNOSIS — Z7901 Long term (current) use of anticoagulants: Secondary | ICD-10-CM | POA: Diagnosis not present

## 2024-07-23 DIAGNOSIS — I480 Paroxysmal atrial fibrillation: Secondary | ICD-10-CM | POA: Diagnosis not present

## 2024-07-23 DIAGNOSIS — E1142 Type 2 diabetes mellitus with diabetic polyneuropathy: Secondary | ICD-10-CM | POA: Diagnosis not present

## 2024-07-23 DIAGNOSIS — I509 Heart failure, unspecified: Secondary | ICD-10-CM | POA: Diagnosis not present

## 2024-07-23 DIAGNOSIS — H409 Unspecified glaucoma: Secondary | ICD-10-CM | POA: Diagnosis not present

## 2024-07-23 DIAGNOSIS — E785 Hyperlipidemia, unspecified: Secondary | ICD-10-CM | POA: Diagnosis not present

## 2024-07-23 DIAGNOSIS — Z96659 Presence of unspecified artificial knee joint: Secondary | ICD-10-CM | POA: Diagnosis not present

## 2024-07-23 DIAGNOSIS — Z89512 Acquired absence of left leg below knee: Secondary | ICD-10-CM | POA: Diagnosis not present

## 2024-07-23 DIAGNOSIS — M1990 Unspecified osteoarthritis, unspecified site: Secondary | ICD-10-CM | POA: Diagnosis not present

## 2024-07-23 DIAGNOSIS — I132 Hypertensive heart and chronic kidney disease with heart failure and with stage 5 chronic kidney disease, or end stage renal disease: Secondary | ICD-10-CM | POA: Diagnosis not present

## 2024-07-23 DIAGNOSIS — I2510 Atherosclerotic heart disease of native coronary artery without angina pectoris: Secondary | ICD-10-CM | POA: Diagnosis not present

## 2024-07-23 DIAGNOSIS — E1136 Type 2 diabetes mellitus with diabetic cataract: Secondary | ICD-10-CM | POA: Diagnosis not present

## 2024-07-24 DIAGNOSIS — E1129 Type 2 diabetes mellitus with other diabetic kidney complication: Secondary | ICD-10-CM | POA: Diagnosis not present

## 2024-07-24 DIAGNOSIS — N186 End stage renal disease: Secondary | ICD-10-CM | POA: Diagnosis not present

## 2024-07-24 DIAGNOSIS — D631 Anemia in chronic kidney disease: Secondary | ICD-10-CM | POA: Diagnosis not present

## 2024-07-24 DIAGNOSIS — D509 Iron deficiency anemia, unspecified: Secondary | ICD-10-CM | POA: Diagnosis not present

## 2024-07-24 DIAGNOSIS — R197 Diarrhea, unspecified: Secondary | ICD-10-CM | POA: Diagnosis not present

## 2024-07-24 DIAGNOSIS — N2581 Secondary hyperparathyroidism of renal origin: Secondary | ICD-10-CM | POA: Diagnosis not present

## 2024-07-24 DIAGNOSIS — Z992 Dependence on renal dialysis: Secondary | ICD-10-CM | POA: Diagnosis not present

## 2024-07-24 NOTE — Progress Notes (Signed)
 Cardiology Office Note:  .   Date:  07/28/2024  ID:  William Webb , DOB 1951-08-19, MRN [id number] PCP: Leonel Cole, MD  [facility] Providers Cardiologist:  Newman Lawrence, MD PCP: Leonel Cole, MD  Chief Complaint  Patient presents with   HFrEF      History of Present Illness: .    William Webb  is a 73 y.o. male with hypertension, hyperlipidemia, type 2 DM,  ESRD on HD, mod AS, PAF, CAD, PAD (s/p b/l BKA), anemia, HFrEF  Patient denies any symptoms of shortness of breath, orthopnea, PND.  Reviewed recent echocardiogram and stress test and proBNP results with the patient, just above.  Patient is currently not taking aspirin  or Eliquis .  He had recent fistulogram and venoplasty to left cephalic vein due to prolonged bleeding after dialysis.  He is concerned about recurrent bleeding risk with either aspirin  or Eliquis   Vitals:   07/28/24 1149  BP: (!) 140/66  Pulse: 70  SpO2: 97%     ROS:  Review of Systems  Cardiovascular:  Negative for chest pain, dyspnea on exertion, leg swelling, palpitations and syncope.     Studies Reviewed: SABRA        EKG 11/28/2023: Sinus rhythm with 1st degree A-V block with occasional Premature ventricular complexes Nonspecific T wave abnormality Prolonged QT When compared with ECG of 20-May-2023 21:46, No significant change was found  Stress test 06/2024:   Reversible inferior perfusion defect consistent with ischemia.  Myocardial blood flow reserve is reduced globally and in each coronary distribution but unreliable in setting of patient's ESRD, prior coronary stenting, and severe systolic heart failure.  LVEF is severely reduced (24% at rest, increases to 32% with stress).  No high risk findings such as TID or drop in EF with stress.  Overall, study suggests RCA territory ischemia and is intermediate risk.   LV perfusion is abnormal. There is evidence of ischemia. Defect 1: There is a small defect with moderate  reduction in uptake present in the apical to mid inferior location(s) that is reversible. There is abnormal wall motion in the defect area. Consistent with ischemia.   Rest left ventricular function is abnormal. Rest global function is severely reduced. Rest EF: 24%. Stress left ventricular function is abnormal. Stress global function is moderately reduced. Stress EF: 32%. End diastolic cavity size is moderately enlarged. End systolic cavity size is severely enlarged.   Myocardial blood flow reserve is not reported in this patient due to technical or patient-specific concerns that affect accuracy.   Coronary calcium  assessment not performed due to prior revascularization.   Findings are consistent with ischemia. The study is intermediate risk.  Echocardiogram 04/2024:  1. Visual appearance and strain pattern concerning for amyloid. Left  ventricular ejection fraction, by estimation, is 35 to 40%. The left  ventricle has moderately decreased function. The left ventricle  demonstrates global hypokinesis. There is moderate  concentric left ventricular hypertrophy. Left ventricular diastolic  parameters are consistent with Grade II diastolic dysfunction  (pseudonormalization). The average left ventricular global longitudinal  strain is -9.2 %. The global longitudinal strain is  abnormal.   2. Right ventricular systolic function is low normal. The right  ventricular size is mildly enlarged. There is severely elevated pulmonary  artery systolic pressure.   3. Left atrial size was severely dilated.   4. Right atrial size was severely dilated.   5. A small pericardial effusion is present. The pericardial effusion is  circumferential. There is no evidence of  cardiac tamponade.   6. The mitral valve is normal in structure. Mild mitral valve  regurgitation. No evidence of mitral stenosis.   7. Low flow based on stroke volume, so gradient may inaccurately predict  degree of stenosis. Mild stenosis by  gradient, moderate by DI (0.28). The  aortic valve is tricuspid. There is moderate calcification of the aortic  valve. Aortic valve regurgitation   is not visualized. Mild to moderate aortic valve stenosis. Aortic valve  mean gradient measures 12.0 mmHg.   8. The inferior vena cava is dilated in size with <50% respiratory  variability, suggesting right atrial pressure of 15 mmHg.   Comparison(s): Prior images reviewed side by side. EF reduced compared to  prior. Appearance and LV strain concerning for amyloid. Mild to moderate  aortic stenosis given low stroke volume.   Labs 06/2024: Chol 109, TG 45, HDL 53, LDL 45 Cr 4.6 ProBNP >70,000   Labs 03/2024: Hb 9.1 Cr 4.22, Albumin  3.0   Labs 01/2024: Chol 97, TG 119, HDL 37, LDL 39 HbA1C 6.6% Hb 9.1 Cr 4.2  10/31/2023: Chol 95, TG 87, HDL 29, LDL 49  Risk Assessment/Calculations:    CHA2DS2-VASc Score = 4  This indicates a 4.8% annual risk of stroke. The patient's score is based upon: CHF History: 0 HTN History: 1 Diabetes History: 1 Stroke History: 0 Vascular Disease History: 1 Age Score: 1 Gender Score: 0     Physical Exam:   Physical Exam Vitals and nursing note reviewed.  Constitutional:      General: He is not in acute distress. Neck:     Vascular: No JVD.  Cardiovascular:     Rate and Rhythm: Normal rate and regular rhythm.     Heart sounds: Normal heart sounds. No murmur heard. Pulmonary:     Effort: Pulmonary effort is normal.     Breath sounds: Normal breath sounds. No wheezing or rales.  Musculoskeletal:     Comments: B/l BKA      VISIT DIAGNOSES:   ICD-10-CM   1. Chronic heart failure with preserved ejection fraction (HCC)  I50.32 Basic metabolic panel with GFR    Basic metabolic panel with GFR    2. Coronary artery disease involving native coronary artery of native heart without angina pectoris  I25.10     3. Paroxysmal atrial fibrillation (HCC)  I48.0 Ambulatory referral to Cardiac  Electrophysiology    DISCONTINUED: apixaban  (ELIQUIS ) 5 MG TABS tablet    4. Nonrheumatic aortic valve stenosis  I35.0     5. PAD (peripheral artery disease)  I73.9     6. ESRD (end stage renal disease) (HCC)  N18.6           ASSESSMENT AND PLAN: .    William Webb  is a 73 y.o. male with hypertension, hyperlipidemia, type 2 DM,  ESRD on HD, mod AS, PAF, CAD, PAD (s/p b/l BKA), anemia, HFrEF  HFrEF: New diagnosis.  Clinically, appears euvolemic, although physical exam is limited due to his bilateral BKA. Recent proBNP >70,000.  However, I am not sure of its clinical accuracy in the setting of ESRD patients. There was concern raised about amyloidosis on his recent echocardiogram.  He has pending referral to heart failure clinic for the same. Recent stress test showed RCA territory ischemia.  He has had prior occlusion of RPDA in 2019.  In absence of anginal symptoms, I do not think revascularization would help his LVEF.  Therefore, recommend heart failure management for now.  Currently taking  Lasix  40 mg twice daily, continue the same. Increase Entresto  to 97-1 03 mg twice daily. Continue carvedilol  6.25 mg twice daily. Not on SGLT2 elevators given ESRD status.  Further recommendations for heart failure management as per heart failure consult.  PAD: B/l BKA. Currently not taking Eliquis  or aspirin . Resume aspirin  81 mg daily, pending consult for Watchman device for PAF.    Aortic stenosis: Moderate. Suspect that this could be related to cardiac amyloidosis. Further workup for amyloidosis as per heart failure recommendations.  PAF: CHA2DS2VASc score 4, annual stroke risk 5%. Recent prolonged bleeding after dialysis due to AV fistula related issues. Currently, not taking Eliquis . In addition to restarting aspirin  81 mg daily given his CAD, PAD, I would refer him to Dr. Cindie for consideration for Watchman device placement.  [facility] Referral for  Left Atrial Appendage Closure with Non-Valvular Atrial Fibrillation   William Webb  is a 73 y.o. male is being referred to the [facility] Team for evaluation for Left Atrial Appendage Closure with Watchman device for the management of stroke risk resulting form non-valvular atrial fibrillation.    Base upon Mr. Kulaga 's history, he is felt to be a poor candidate for long-term anticoagulation because of a history of bleeding (e.g. intracerebral, subdural, GI, retro-peritoneal) and increased bleeding risk (e.g. thrombocytopenia, cancer, risk of tumor associated bleeding in case of systemic anticoagulation).  The patient has a HAS-BLED score of 4 indicating a Yearly Major Bleeding Risk of 8.7%.      His CHADS2-VASc Score is 4 with an unadjusted Ischemic Stroke Rate (% per year) of 4.8%.    His stroke risk necessitates a strategy of stroke prevention with either long-term oral anticoagulation or left atrial appendage occlusion therapy. We have discussed their bleeding risk in the context of their comorbid medical problems, as well as the rationale for referral for evaluation of Watchman left atrial appendage occlusion therapy. While the patient is at high long-term bleeding risk, they may be appropriate for short-term anticoagulation. Based on this individual patient's stroke and bleeding risk, a shared decision has been made to refer the patient for consideration of Watchman left atrial appendage closure utilizing the [employer] shared decision tool.  CAD: No angina symptoms. Ischemia in RCA territory, known RPDA occlusion in the past. In absence of anginal symptoms, continue medical management. Continue aspirin , statin, carvedilol .  Hypertension: Controlled.   Meds ordered this encounter  Medications   DISCONTD: apixaban  (ELIQUIS ) 5 MG TABS tablet    Sig: Take 1 tablet (5 mg total) by mouth 2 (two) times daily.   furosemide  (LASIX ) 40 MG  tablet    Sig: Take 1 tablet (40 mg total) by mouth 2 (two) times daily.   sacubitril -valsartan  (ENTRESTO ) 97-103 MG    Sig: Take 1 tablet by mouth 2 (two) times daily.    Dispense:  180 tablet    Refill:  3   aspirin  EC 81 MG tablet    Sig: Take 1 tablet (81 mg total) by mouth daily. Swallow whole.    Dispense:  90 tablet    Refill:  3     F/u in 08/2024 as previously scheduled.   Signed, Newman JINNY Lawrence, MD

## 2024-07-27 DIAGNOSIS — R197 Diarrhea, unspecified: Secondary | ICD-10-CM | POA: Diagnosis not present

## 2024-07-27 DIAGNOSIS — Z992 Dependence on renal dialysis: Secondary | ICD-10-CM | POA: Diagnosis not present

## 2024-07-27 DIAGNOSIS — N186 End stage renal disease: Secondary | ICD-10-CM | POA: Diagnosis not present

## 2024-07-27 DIAGNOSIS — N2581 Secondary hyperparathyroidism of renal origin: Secondary | ICD-10-CM | POA: Diagnosis not present

## 2024-07-27 DIAGNOSIS — D509 Iron deficiency anemia, unspecified: Secondary | ICD-10-CM | POA: Diagnosis not present

## 2024-07-27 DIAGNOSIS — D631 Anemia in chronic kidney disease: Secondary | ICD-10-CM | POA: Diagnosis not present

## 2024-07-27 DIAGNOSIS — E1129 Type 2 diabetes mellitus with other diabetic kidney complication: Secondary | ICD-10-CM | POA: Diagnosis not present

## 2024-07-28 ENCOUNTER — Encounter (HOSPITAL_COMMUNITY): Payer: Self-pay

## 2024-07-28 ENCOUNTER — Encounter: Payer: Self-pay | Admitting: Cardiology

## 2024-07-28 ENCOUNTER — Other Ambulatory Visit (HOSPITAL_COMMUNITY): Payer: Self-pay

## 2024-07-28 ENCOUNTER — Ambulatory Visit: Attending: Cardiology | Admitting: Cardiology

## 2024-07-28 VITALS — BP 140/66 | HR 70 | Ht 75.0 in | Wt 145.0 lb

## 2024-07-28 DIAGNOSIS — Z89512 Acquired absence of left leg below knee: Secondary | ICD-10-CM | POA: Diagnosis not present

## 2024-07-28 DIAGNOSIS — E46 Unspecified protein-calorie malnutrition: Secondary | ICD-10-CM | POA: Diagnosis not present

## 2024-07-28 DIAGNOSIS — Z7982 Long term (current) use of aspirin: Secondary | ICD-10-CM | POA: Diagnosis not present

## 2024-07-28 DIAGNOSIS — N186 End stage renal disease: Secondary | ICD-10-CM

## 2024-07-28 DIAGNOSIS — I739 Peripheral vascular disease, unspecified: Secondary | ICD-10-CM

## 2024-07-28 DIAGNOSIS — I480 Paroxysmal atrial fibrillation: Secondary | ICD-10-CM | POA: Diagnosis not present

## 2024-07-28 DIAGNOSIS — I350 Nonrheumatic aortic (valve) stenosis: Secondary | ICD-10-CM

## 2024-07-28 DIAGNOSIS — I2510 Atherosclerotic heart disease of native coronary artery without angina pectoris: Secondary | ICD-10-CM

## 2024-07-28 DIAGNOSIS — M1990 Unspecified osteoarthritis, unspecified site: Secondary | ICD-10-CM | POA: Diagnosis not present

## 2024-07-28 DIAGNOSIS — E1142 Type 2 diabetes mellitus with diabetic polyneuropathy: Secondary | ICD-10-CM | POA: Diagnosis not present

## 2024-07-28 DIAGNOSIS — E785 Hyperlipidemia, unspecified: Secondary | ICD-10-CM | POA: Diagnosis not present

## 2024-07-28 DIAGNOSIS — Z96659 Presence of unspecified artificial knee joint: Secondary | ICD-10-CM | POA: Diagnosis not present

## 2024-07-28 DIAGNOSIS — Z89511 Acquired absence of right leg below knee: Secondary | ICD-10-CM | POA: Diagnosis not present

## 2024-07-28 DIAGNOSIS — I252 Old myocardial infarction: Secondary | ICD-10-CM | POA: Diagnosis not present

## 2024-07-28 DIAGNOSIS — I5032 Chronic diastolic (congestive) heart failure: Secondary | ICD-10-CM | POA: Insufficient documentation

## 2024-07-28 DIAGNOSIS — E1151 Type 2 diabetes mellitus with diabetic peripheral angiopathy without gangrene: Secondary | ICD-10-CM | POA: Diagnosis not present

## 2024-07-28 DIAGNOSIS — E1129 Type 2 diabetes mellitus with other diabetic kidney complication: Secondary | ICD-10-CM | POA: Diagnosis not present

## 2024-07-28 DIAGNOSIS — I509 Heart failure, unspecified: Secondary | ICD-10-CM | POA: Diagnosis not present

## 2024-07-28 DIAGNOSIS — Z992 Dependence on renal dialysis: Secondary | ICD-10-CM | POA: Diagnosis not present

## 2024-07-28 DIAGNOSIS — E1136 Type 2 diabetes mellitus with diabetic cataract: Secondary | ICD-10-CM | POA: Diagnosis not present

## 2024-07-28 DIAGNOSIS — I132 Hypertensive heart and chronic kidney disease with heart failure and with stage 5 chronic kidney disease, or end stage renal disease: Secondary | ICD-10-CM | POA: Diagnosis not present

## 2024-07-28 DIAGNOSIS — Z993 Dependence on wheelchair: Secondary | ICD-10-CM | POA: Diagnosis not present

## 2024-07-28 DIAGNOSIS — M797 Fibromyalgia: Secondary | ICD-10-CM | POA: Diagnosis not present

## 2024-07-28 DIAGNOSIS — H409 Unspecified glaucoma: Secondary | ICD-10-CM | POA: Diagnosis not present

## 2024-07-28 DIAGNOSIS — Z79899 Other long term (current) drug therapy: Secondary | ICD-10-CM | POA: Diagnosis not present

## 2024-07-28 DIAGNOSIS — E1122 Type 2 diabetes mellitus with diabetic chronic kidney disease: Secondary | ICD-10-CM | POA: Diagnosis not present

## 2024-07-28 DIAGNOSIS — K219 Gastro-esophageal reflux disease without esophagitis: Secondary | ICD-10-CM | POA: Diagnosis not present

## 2024-07-28 DIAGNOSIS — Z89429 Acquired absence of other toe(s), unspecified side: Secondary | ICD-10-CM | POA: Diagnosis not present

## 2024-07-28 DIAGNOSIS — Z7901 Long term (current) use of anticoagulants: Secondary | ICD-10-CM | POA: Diagnosis not present

## 2024-07-28 MED ORDER — ASPIRIN 81 MG PO TBEC
81.0000 mg | DELAYED_RELEASE_TABLET | Freq: Every day | ORAL | 3 refills | Status: DC
  Filled 2024-07-28: qty 90, 90d supply, fill #0

## 2024-07-28 MED ORDER — SACUBITRIL-VALSARTAN 97-103 MG PO TABS
1.0000 | ORAL_TABLET | Freq: Two times a day (BID) | ORAL | 3 refills | Status: AC
  Filled 2024-07-28: qty 60, 30d supply, fill #0

## 2024-07-28 MED ORDER — FUROSEMIDE 40 MG PO TABS: 40.0000 mg | ORAL_TABLET | Freq: Two times a day (BID) | ORAL | Status: AC

## 2024-07-28 MED ORDER — APIXABAN 5 MG PO TABS: 5.0000 mg | ORAL_TABLET | Freq: Two times a day (BID) | ORAL | Status: DC

## 2024-07-28 NOTE — Patient Instructions (Addendum)
 Medication Instructions:  START Aspirin  81 mg daily  STOP Eliquis    Continue Furosemide  40 mg twice daily   INCREASE Entresto  to 97-103 mg   *If you need a refill on your cardiac medications before your next appointment, please call your pharmacy*  Lab Work: Bmp in 1 week   If you have labs (blood work) drawn today and your tests are completely normal, you will receive your results only by: MyChart Message (if you have MyChart) OR A paper copy in the mail If you have any lab test that is abnormal or we need to change your treatment, we will call you to review the results.  Need to schedule appointment with Heart Failure   Referral to Electrophysiology    Follow-Up: At [facility], you and your health needs are our priority.  As part of our continuing mission to provide you with exceptional heart care, our providers are all part of one team.  This team includes your primary Cardiologist (physician) and Advanced Practice Providers or APPs (Physician Assistants and Nurse Practitioners) who all work together to provide you with the care you need, when you need it.  Your next appointment:   Keep appointment in November 2025  Provider:   Newman JINNY Lawrence, MD

## 2024-07-29 ENCOUNTER — Telehealth (HOSPITAL_COMMUNITY): Payer: Self-pay

## 2024-07-29 ENCOUNTER — Encounter (HOSPITAL_COMMUNITY): Payer: Self-pay

## 2024-08-06 ENCOUNTER — Encounter (HOSPITAL_COMMUNITY): Payer: Self-pay

## 2024-08-06 ENCOUNTER — Telehealth: Payer: Self-pay | Admitting: Physician Assistant

## 2024-08-06 NOTE — Telephone Encounter (Signed)
 Scheduled appointments with the patient family member per staff message and secure chat.

## 2024-08-10 ENCOUNTER — Other Ambulatory Visit: Payer: Self-pay | Admitting: Physician Assistant

## 2024-08-10 DIAGNOSIS — D472 Monoclonal gammopathy: Secondary | ICD-10-CM

## 2024-08-10 DIAGNOSIS — D649 Anemia, unspecified: Secondary | ICD-10-CM

## 2024-08-11 ENCOUNTER — Inpatient Hospital Stay (HOSPITAL_BASED_OUTPATIENT_CLINIC_OR_DEPARTMENT_OTHER): Payer: Medicare (Managed Care) | Admitting: Physician Assistant

## 2024-08-11 ENCOUNTER — Inpatient Hospital Stay: Payer: Medicare (Managed Care) | Attending: Hematology and Oncology

## 2024-08-11 VITALS — BP 95/61 | HR 69 | Temp 97.3°F | Resp 16 | Ht 75.0 in

## 2024-08-11 DIAGNOSIS — M810 Age-related osteoporosis without current pathological fracture: Secondary | ICD-10-CM | POA: Diagnosis not present

## 2024-08-11 DIAGNOSIS — Z992 Dependence on renal dialysis: Secondary | ICD-10-CM | POA: Insufficient documentation

## 2024-08-11 DIAGNOSIS — R197 Diarrhea, unspecified: Secondary | ICD-10-CM | POA: Diagnosis not present

## 2024-08-11 DIAGNOSIS — M797 Fibromyalgia: Secondary | ICD-10-CM | POA: Insufficient documentation

## 2024-08-11 DIAGNOSIS — D45 Polycythemia vera: Secondary | ICD-10-CM | POA: Insufficient documentation

## 2024-08-11 DIAGNOSIS — Z8249 Family history of ischemic heart disease and other diseases of the circulatory system: Secondary | ICD-10-CM | POA: Insufficient documentation

## 2024-08-11 DIAGNOSIS — Z8619 Personal history of other infectious and parasitic diseases: Secondary | ICD-10-CM | POA: Diagnosis not present

## 2024-08-11 DIAGNOSIS — R040 Epistaxis: Secondary | ICD-10-CM | POA: Insufficient documentation

## 2024-08-11 DIAGNOSIS — D472 Monoclonal gammopathy: Secondary | ICD-10-CM | POA: Insufficient documentation

## 2024-08-11 DIAGNOSIS — I252 Old myocardial infarction: Secondary | ICD-10-CM | POA: Insufficient documentation

## 2024-08-11 DIAGNOSIS — D649 Anemia, unspecified: Secondary | ICD-10-CM

## 2024-08-11 DIAGNOSIS — I509 Heart failure, unspecified: Secondary | ICD-10-CM | POA: Insufficient documentation

## 2024-08-11 DIAGNOSIS — E1122 Type 2 diabetes mellitus with diabetic chronic kidney disease: Secondary | ICD-10-CM | POA: Diagnosis not present

## 2024-08-11 DIAGNOSIS — Z635 Disruption of family by separation and divorce: Secondary | ICD-10-CM | POA: Insufficient documentation

## 2024-08-11 DIAGNOSIS — I132 Hypertensive heart and chronic kidney disease with heart failure and with stage 5 chronic kidney disease, or end stage renal disease: Secondary | ICD-10-CM | POA: Insufficient documentation

## 2024-08-11 DIAGNOSIS — Z89512 Acquired absence of left leg below knee: Secondary | ICD-10-CM | POA: Insufficient documentation

## 2024-08-11 DIAGNOSIS — N186 End stage renal disease: Secondary | ICD-10-CM | POA: Insufficient documentation

## 2024-08-11 DIAGNOSIS — Z87891 Personal history of nicotine dependence: Secondary | ICD-10-CM | POA: Insufficient documentation

## 2024-08-11 DIAGNOSIS — Z800 Family history of malignant neoplasm of digestive organs: Secondary | ICD-10-CM | POA: Insufficient documentation

## 2024-08-11 DIAGNOSIS — E785 Hyperlipidemia, unspecified: Secondary | ICD-10-CM | POA: Diagnosis not present

## 2024-08-11 DIAGNOSIS — Z83438 Family history of other disorder of lipoprotein metabolism and other lipidemia: Secondary | ICD-10-CM | POA: Insufficient documentation

## 2024-08-11 DIAGNOSIS — Z7982 Long term (current) use of aspirin: Secondary | ICD-10-CM | POA: Insufficient documentation

## 2024-08-11 DIAGNOSIS — Z79899 Other long term (current) drug therapy: Secondary | ICD-10-CM | POA: Diagnosis not present

## 2024-08-11 DIAGNOSIS — Z8701 Personal history of pneumonia (recurrent): Secondary | ICD-10-CM | POA: Insufficient documentation

## 2024-08-11 DIAGNOSIS — I4819 Other persistent atrial fibrillation: Secondary | ICD-10-CM | POA: Diagnosis not present

## 2024-08-11 DIAGNOSIS — D61818 Other pancytopenia: Secondary | ICD-10-CM | POA: Diagnosis present

## 2024-08-11 DIAGNOSIS — I2510 Atherosclerotic heart disease of native coronary artery without angina pectoris: Secondary | ICD-10-CM | POA: Insufficient documentation

## 2024-08-11 DIAGNOSIS — Z89421 Acquired absence of other right toe(s): Secondary | ICD-10-CM | POA: Insufficient documentation

## 2024-08-11 DIAGNOSIS — Z604 Social exclusion and rejection: Secondary | ICD-10-CM | POA: Insufficient documentation

## 2024-08-11 DIAGNOSIS — Z833 Family history of diabetes mellitus: Secondary | ICD-10-CM | POA: Insufficient documentation

## 2024-08-11 DIAGNOSIS — C7951 Secondary malignant neoplasm of bone: Secondary | ICD-10-CM | POA: Insufficient documentation

## 2024-08-11 DIAGNOSIS — Z809 Family history of malignant neoplasm, unspecified: Secondary | ICD-10-CM | POA: Insufficient documentation

## 2024-08-11 LAB — CBC WITH DIFFERENTIAL (CANCER CENTER ONLY)
Abs Immature Granulocytes: 0.01 K/uL (ref 0.00–0.07)
Basophils Absolute: 0 K/uL (ref 0.0–0.1)
Basophils Relative: 1 %
Eosinophils Absolute: 0.1 K/uL (ref 0.0–0.5)
Eosinophils Relative: 5 %
HCT: 36.9 % — ABNORMAL LOW (ref 39.0–52.0)
Hemoglobin: 11.7 g/dL — ABNORMAL LOW (ref 13.0–17.0)
Immature Granulocytes: 0 %
Lymphocytes Relative: 25 %
Lymphs Abs: 0.7 K/uL (ref 0.7–4.0)
MCH: 26.6 pg (ref 26.0–34.0)
MCHC: 31.7 g/dL (ref 30.0–36.0)
MCV: 83.9 fL (ref 80.0–100.0)
Monocytes Absolute: 0.2 K/uL (ref 0.1–1.0)
Monocytes Relative: 9 %
Neutro Abs: 1.7 K/uL (ref 1.7–7.7)
Neutrophils Relative %: 60 %
Platelet Count: 49 K/uL — ABNORMAL LOW (ref 150–400)
RBC: 4.4 MIL/uL (ref 4.22–5.81)
RDW: 15.3 % (ref 11.5–15.5)
WBC Count: 2.8 K/uL — ABNORMAL LOW (ref 4.0–10.5)
nRBC: 0 % (ref 0.0–0.2)

## 2024-08-11 LAB — CMP (CANCER CENTER ONLY)
ALT: 5 U/L (ref 0–44)
AST: 11 U/L — ABNORMAL LOW (ref 15–41)
Albumin: 4 g/dL (ref 3.5–5.0)
Alkaline Phosphatase: 121 U/L (ref 38–126)
Anion gap: 8 (ref 5–15)
BUN: 29 mg/dL — ABNORMAL HIGH (ref 8–23)
CO2: 29 mmol/L (ref 22–32)
Calcium: 9.3 mg/dL (ref 8.9–10.3)
Chloride: 98 mmol/L (ref 98–111)
Creatinine: 4.76 mg/dL — ABNORMAL HIGH (ref 0.61–1.24)
GFR, Estimated: 12 mL/min — ABNORMAL LOW (ref >60)
Glucose, Bld: 95 mg/dL (ref 70–99)
Potassium: 4.1 mmol/L (ref 3.5–5.1)
Sodium: 135 mmol/L (ref 135–145)
Total Bilirubin: 0.5 mg/dL (ref 0.0–1.2)
Total Protein: 7.8 g/dL (ref 6.5–8.1)

## 2024-08-11 LAB — SAMPLE TO BLOOD BANK
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
## 2024-08-12 LAB — KAPPA/LAMBDA LIGHT CHAINS
Kappa free light chain: 2320.7 mg/L — ABNORMAL HIGH (ref 3.3–19.4)
Kappa, lambda light chain ratio: 11.46 — ABNORMAL HIGH (ref 0.26–1.65)
Lambda free light chains: 202.5 mg/L — ABNORMAL HIGH (ref 5.7–26.3)

## 2024-08-13 ENCOUNTER — Ambulatory Visit (HOSPITAL_COMMUNITY)
Admission: RE | Admit: 2024-08-13 | Discharge: 2024-08-13 | Disposition: A | Discharge status: Home / Self Care | Payer: Medicare (Managed Care) | Source: Ambulatory Visit | Attending: Physician Assistant | Admitting: Physician Assistant

## 2024-08-13 DIAGNOSIS — D472 Monoclonal gammopathy: Secondary | ICD-10-CM | POA: Diagnosis present

## 2024-08-17 LAB — MULTIPLE MYELOMA PANEL, SERUM
Albumin SerPl Elph-Mcnc: 3.6 g/dL (ref 2.9–4.4)
Albumin/Glob SerPl: 1 (ref 0.7–1.7)
Alpha 1: 0.2 g/dL (ref 0.0–0.4)
Alpha2 Glob SerPl Elph-Mcnc: 0.7 g/dL (ref 0.4–1.0)
B-Globulin SerPl Elph-Mcnc: 1 g/dL (ref 0.7–1.3)
Gamma Glob SerPl Elph-Mcnc: 2 g/dL — ABNORMAL HIGH (ref 0.4–1.8)
Globulin, Total: 3.8 g/dL (ref 2.2–3.9)
IgA: 408 mg/dL (ref 61–437)
IgG (Immunoglobin G), Serum: 2102 mg/dL — ABNORMAL HIGH (ref 603–1613)
IgM (Immunoglobulin M), Srm: 78 mg/dL (ref 15–143)
Total Protein ELP: 7.4 g/dL (ref 6.0–8.5)

## 2024-08-18 ENCOUNTER — Ambulatory Visit: Payer: Self-pay | Admitting: Physician Assistant

## 2024-08-24 ENCOUNTER — Telehealth (HOSPITAL_COMMUNITY): Payer: Self-pay | Admitting: Adult Health

## 2024-08-24 NOTE — Telephone Encounter (Signed)
 Called to confirm/remind patient of their appointment at [facility] on 08/24/24.   Appointment:   [x] Confirmed  [] Left mess   [] No answer/No voice mail  [] VM Full/unable to leave message  [] Phone not in service  Patient reminded to bring all medications and/or complete list.  Confirmed patient has transportation. Gave directions, instructed to utilize valet parking.

## 2024-08-25 ENCOUNTER — Ambulatory Visit (HOSPITAL_COMMUNITY)
Admission: RE | Admit: 2024-08-25 | Discharge: 2024-08-25 | Disposition: A | Discharge status: Home / Self Care | Payer: Medicare (Managed Care) | Source: Ambulatory Visit | Attending: Adult Health | Admitting: Adult Health

## 2024-08-25 ENCOUNTER — Encounter (HOSPITAL_COMMUNITY): Payer: Self-pay | Admitting: Cardiology

## 2024-08-25 VITALS — BP 136/86 | HR 62 | Ht 75.0 in | Wt 171.0 lb

## 2024-08-25 DIAGNOSIS — I132 Hypertensive heart and chronic kidney disease with heart failure and with stage 5 chronic kidney disease, or end stage renal disease: Secondary | ICD-10-CM | POA: Insufficient documentation

## 2024-08-25 DIAGNOSIS — Z89512 Acquired absence of left leg below knee: Secondary | ICD-10-CM | POA: Diagnosis not present

## 2024-08-25 DIAGNOSIS — Z955 Presence of coronary angioplasty implant and graft: Secondary | ICD-10-CM | POA: Insufficient documentation

## 2024-08-25 DIAGNOSIS — I5032 Chronic diastolic (congestive) heart failure: Secondary | ICD-10-CM

## 2024-08-25 DIAGNOSIS — E1122 Type 2 diabetes mellitus with diabetic chronic kidney disease: Secondary | ICD-10-CM | POA: Insufficient documentation

## 2024-08-25 DIAGNOSIS — I350 Nonrheumatic aortic (valve) stenosis: Secondary | ICD-10-CM | POA: Diagnosis not present

## 2024-08-25 DIAGNOSIS — I10 Essential (primary) hypertension: Secondary | ICD-10-CM | POA: Diagnosis not present

## 2024-08-25 DIAGNOSIS — I2510 Atherosclerotic heart disease of native coronary artery without angina pectoris: Secondary | ICD-10-CM | POA: Diagnosis not present

## 2024-08-25 DIAGNOSIS — Z87891 Personal history of nicotine dependence: Secondary | ICD-10-CM | POA: Diagnosis not present

## 2024-08-25 DIAGNOSIS — N186 End stage renal disease: Secondary | ICD-10-CM | POA: Insufficient documentation

## 2024-08-25 DIAGNOSIS — I25118 Atherosclerotic heart disease of native coronary artery with other forms of angina pectoris: Secondary | ICD-10-CM

## 2024-08-25 DIAGNOSIS — I5022 Chronic systolic (congestive) heart failure: Secondary | ICD-10-CM | POA: Diagnosis not present

## 2024-08-25 DIAGNOSIS — Z89511 Acquired absence of right leg below knee: Secondary | ICD-10-CM | POA: Insufficient documentation

## 2024-08-25 DIAGNOSIS — Z79899 Other long term (current) drug therapy: Secondary | ICD-10-CM | POA: Insufficient documentation

## 2024-08-25 DIAGNOSIS — E1140 Type 2 diabetes mellitus with diabetic neuropathy, unspecified: Secondary | ICD-10-CM | POA: Diagnosis not present
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
## 2024-08-25 NOTE — Patient Instructions (Signed)
 Medication Changes:  No Changes In Medications at this time.   Lab Work:  Labs done today, your results will be available in MyChart, we will contact you for abnormal readings.  Genetic testing has been collected, this has to be sent to [place]  for processing and can take 1-2 weeks for us  to get results back.  We will let you know the results once reviewed by your provider.  Testing/Procedures:  You have been ordered a PYP Scan.  This is done at the [facility] located on [facility], they will call you to schedule.  When you come for this test please plan to be there 2-3 hours.  They are located at: [address] [place], [place] [address]  [facility] WILL CALL TO SCHEDULE THIS ONCE APPROVED WITH INSURANCE   [facility] [address] [place], [place] [address] Please take advantage of the free valet parking available at the [facility] and [employer] (Entrance C).  Proceed to the [facility] Radiology Department (First Floor) for check-in.   Magnetic resonance imaging (MRI) is a painless test that produces images of the inside of the body without using Xrays.  During an MRI, strong magnets and radio waves work together in a data processing manager to form detailed images.   MRI images may provide more details about a medical condition than X-rays, CT scans, and ultrasounds can provide.  You may be given earphones to listen for instructions.  You may eat a light breakfast and take medications as ordered with the exception of furosemide , hydrochlorothiazide, chlorthalidone or spironolactone (or any other fluid pill). If you are undergoing a stress MRI, please avoid stimulants for 12 hr prior to test. (I.e. Caffeine, nicotine, chocolate, or antihistamine medications)  If your provider has ordered anti-anxiety medications for this test, then you will need a driver.  An IV will be inserted into one of your veins. Contrast material will be injected into  your IV. It will leave your body through your urine within a day. You may be told to drink plenty of fluids to help flush the contrast material out of your system.  You will be asked to remove all metal, including: Watch, jewelry, and other metal objects including hearing aids, hair pieces and dentures. Also wearable glucose monitoring systems (ie. Freestyle Libre and Omnipods) (Braces and fillings normally are not a problem.)   TEST WILL TAKE APPROXIMATELY 1 HOUR  PLEASE NOTIFY SCHEDULING AT LEAST 24 HOURS IN ADVANCE IF YOU ARE UNABLE TO KEEP YOUR APPOINTMENT. [phone]  For more information and frequently asked questions, please visit our website : [email or web address]  Please call the Cardiac Imaging Nurse Navigators with any questions/concerns. [phone] Office   Follow-Up in: 6 WEEKS AS SCHEDULED WITH DR. ROLAN   At the [facility], you and your health needs are our priority. We have a designated team specialized in the treatment of Heart Failure. This Care Team includes your primary Heart Failure Specialized Cardiologist (physician), Advanced Practice Providers (APPs- Physician Assistants and Nurse Practitioners), and Pharmacist who all work together to provide you with the care you need, when you need it.   You may see any of the following providers on your designated Care Team at your next follow up:  Dr. Toribio Fuel Dr. Ezra Rolan Dr. Odis Brownie Greig Mosses, NP Caffie Shed, [place] [facility] [place], [place] Beckey Coe, NP Jordan Lee, NP Tinnie Redman, PharmD   Please be sure to bring in all your  medications bottles to every appointment.   Need to Contact Us :  If you have any questions or concerns before your next appointment please send us  a message through [place] or call our office at [phone].    TO LEAVE A MESSAGE FOR THE NURSE SELECT OPTION 2, PLEASE LEAVE A MESSAGE INCLUDING: YOUR NAME DATE OF BIRTH CALL  BACK NUMBER REASON FOR CALL**this is important as we prioritize the call backs  YOU WILL RECEIVE A CALL BACK THE SAME DAY AS LONG AS YOU CALL BEFORE 4:00 PM

## 2024-08-25 NOTE — Progress Notes (Signed)
 CARDIOMYOPATHY genetic testing collected via BLOOD SAMPLE per Dr Gala Romney.  Order form completed, signed and shipped with sample by [employer] to [facility].

## 2024-08-28 LAB — MULTIPLE MYELOMA PANEL, SERUM
Albumin SerPl Elph-Mcnc: 3.4 g/dL (ref 2.9–4.4)
Albumin/Glob SerPl: 0.9 (ref 0.7–1.7)
Alpha 1: 0.2 g/dL (ref 0.0–0.4)
Alpha2 Glob SerPl Elph-Mcnc: 0.7 g/dL (ref 0.4–1.0)
B-Globulin SerPl Elph-Mcnc: 1.1 g/dL (ref 0.7–1.3)
Gamma Glob SerPl Elph-Mcnc: 2 g/dL — ABNORMAL HIGH (ref 0.4–1.8)
Globulin, Total: 4.1 g/dL — ABNORMAL HIGH (ref 2.2–3.9)
IgA: 373 mg/dL (ref 61–437)
IgG (Immunoglobin G), Serum: 2000 mg/dL — ABNORMAL HIGH (ref 603–1613)
IgM (Immunoglobulin M), Srm: 75 mg/dL (ref 15–143)
M Protein SerPl Elph-Mcnc: 0.6 g/dL — ABNORMAL HIGH
Total Protein ELP: 7.5 g/dL (ref 6.0–8.5)

## 2024-08-31 ENCOUNTER — Ambulatory Visit (HOSPITAL_COMMUNITY): Payer: Self-pay | Admitting: Adult Health

## 2024-08-31 ENCOUNTER — Encounter: Payer: Self-pay | Admitting: Radiology

## 2024-09-03 ENCOUNTER — Telehealth (HOSPITAL_COMMUNITY): Payer: Self-pay | Admitting: *Deleted

## 2024-09-03 NOTE — Telephone Encounter (Signed)
 Amyloid instructions given to pt's nephew, per DPR.

## 2024-09-07 ENCOUNTER — Other Ambulatory Visit: Payer: Self-pay

## 2024-09-08 ENCOUNTER — Ambulatory Visit (INDEPENDENT_AMBULATORY_CARE_PROVIDER_SITE_OTHER): Payer: Medicare (Managed Care) | Admitting: Neurology

## 2024-09-08 ENCOUNTER — Other Ambulatory Visit (HOSPITAL_COMMUNITY): Payer: Self-pay | Admitting: Adult Health

## 2024-09-08 ENCOUNTER — Other Ambulatory Visit: Payer: Self-pay

## 2024-09-08 ENCOUNTER — Encounter (HOSPITAL_COMMUNITY): Payer: Self-pay

## 2024-09-08 ENCOUNTER — Ambulatory Visit (HOSPITAL_COMMUNITY)
Admission: RE | Admit: 2024-09-08 | Discharge: 2024-09-08 | Disposition: A | Discharge status: Home / Self Care | Payer: Medicare (Managed Care) | Source: Ambulatory Visit | Attending: Physician Assistant | Admitting: Physician Assistant

## 2024-09-08 ENCOUNTER — Ambulatory Visit (HOSPITAL_COMMUNITY)
Admission: RE | Admit: 2024-09-08 | Discharge: 2024-09-08 | Disposition: A | Payer: Medicare (Managed Care) | Source: Ambulatory Visit | Attending: Physician Assistant

## 2024-09-08 ENCOUNTER — Encounter: Payer: Self-pay | Admitting: Neurology

## 2024-09-08 VITALS — BP 168/82 | HR 70 | Ht 75.0 in

## 2024-09-08 VITALS — BP 180/104 | HR 71 | Temp 98.6°F | Resp 16 | Ht 75.0 in | Wt 173.0 lb

## 2024-09-08 DIAGNOSIS — D61818 Other pancytopenia: Secondary | ICD-10-CM | POA: Insufficient documentation

## 2024-09-08 DIAGNOSIS — N186 End stage renal disease: Secondary | ICD-10-CM | POA: Diagnosis not present

## 2024-09-08 DIAGNOSIS — Z171 Estrogen receptor negative status [ER-]: Secondary | ICD-10-CM | POA: Insufficient documentation

## 2024-09-08 DIAGNOSIS — Z635 Disruption of family by separation and divorce: Secondary | ICD-10-CM | POA: Diagnosis not present

## 2024-09-08 DIAGNOSIS — I120 Hypertensive chronic kidney disease with stage 5 chronic kidney disease or end stage renal disease: Secondary | ICD-10-CM | POA: Diagnosis not present

## 2024-09-08 DIAGNOSIS — Z794 Long term (current) use of insulin: Secondary | ICD-10-CM | POA: Insufficient documentation

## 2024-09-08 DIAGNOSIS — I132 Hypertensive heart and chronic kidney disease with heart failure and with stage 5 chronic kidney disease, or end stage renal disease: Secondary | ICD-10-CM | POA: Insufficient documentation

## 2024-09-08 DIAGNOSIS — D72822 Plasmacytosis: Secondary | ICD-10-CM | POA: Insufficient documentation

## 2024-09-08 DIAGNOSIS — E785 Hyperlipidemia, unspecified: Secondary | ICD-10-CM | POA: Diagnosis not present

## 2024-09-08 DIAGNOSIS — E1122 Type 2 diabetes mellitus with diabetic chronic kidney disease: Secondary | ICD-10-CM | POA: Insufficient documentation

## 2024-09-08 DIAGNOSIS — Z992 Dependence on renal dialysis: Secondary | ICD-10-CM | POA: Insufficient documentation

## 2024-09-08 DIAGNOSIS — Z604 Social exclusion and rejection: Secondary | ICD-10-CM | POA: Insufficient documentation

## 2024-09-08 DIAGNOSIS — Z1722 Progesterone receptor negative status: Secondary | ICD-10-CM | POA: Diagnosis not present

## 2024-09-08 DIAGNOSIS — E1151 Type 2 diabetes mellitus with diabetic peripheral angiopathy without gangrene: Secondary | ICD-10-CM | POA: Diagnosis not present

## 2024-09-08 DIAGNOSIS — Z87891 Personal history of nicotine dependence: Secondary | ICD-10-CM | POA: Insufficient documentation

## 2024-09-08 DIAGNOSIS — G629 Polyneuropathy, unspecified: Secondary | ICD-10-CM | POA: Diagnosis not present

## 2024-09-08 DIAGNOSIS — I509 Heart failure, unspecified: Secondary | ICD-10-CM | POA: Insufficient documentation

## 2024-09-08 DIAGNOSIS — D696 Thrombocytopenia, unspecified: Secondary | ICD-10-CM | POA: Insufficient documentation

## 2024-09-08 DIAGNOSIS — I252 Old myocardial infarction: Secondary | ICD-10-CM | POA: Insufficient documentation

## 2024-09-08 DIAGNOSIS — I5032 Chronic diastolic (congestive) heart failure: Secondary | ICD-10-CM

## 2024-09-08 DIAGNOSIS — D472 Monoclonal gammopathy: Secondary | ICD-10-CM | POA: Insufficient documentation

## 2024-09-08 DIAGNOSIS — Z79899 Other long term (current) drug therapy: Secondary | ICD-10-CM | POA: Insufficient documentation

## 2024-09-08 DIAGNOSIS — Z7982 Long term (current) use of aspirin: Secondary | ICD-10-CM | POA: Insufficient documentation

## 2024-09-08 LAB — CBC WITH DIFFERENTIAL/PLATELET
Abs Immature Granulocytes: 0.01 K/uL (ref 0.00–0.07)
Basophils Absolute: 0 K/uL (ref 0.0–0.1)
Basophils Relative: 1 %
Eosinophils Absolute: 0.1 K/uL (ref 0.0–0.5)
Eosinophils Relative: 2 %
HCT: 27.8 % — ABNORMAL LOW (ref 39.0–52.0)
Hemoglobin: 8.7 g/dL — ABNORMAL LOW (ref 13.0–17.0)
Immature Granulocytes: 0 %
Lymphocytes Relative: 19 %
Lymphs Abs: 0.6 K/uL — ABNORMAL LOW (ref 0.7–4.0)
MCH: 28.4 pg (ref 26.0–34.0)
MCHC: 31.3 g/dL (ref 30.0–36.0)
MCV: 90.8 fL (ref 80.0–100.0)
Monocytes Absolute: 0.4 K/uL (ref 0.1–1.0)
Monocytes Relative: 11 %
Neutro Abs: 2.3 K/uL (ref 1.7–7.7)
Neutrophils Relative %: 67 %
Platelets: 55 K/uL — ABNORMAL LOW (ref 150–400)
RBC: 3.06 MIL/uL — ABNORMAL LOW (ref 4.22–5.81)
RDW: 17.9 % — ABNORMAL HIGH (ref 11.5–15.5)
WBC: 3.4 K/uL — ABNORMAL LOW (ref 4.0–10.5)
nRBC: 0 % (ref 0.0–0.2)

## 2024-09-08 LAB — GLUCOSE, CAPILLARY: Glucose-Capillary: 93 mg/dL (ref 70–99)

## 2024-09-08 MED ORDER — SODIUM CHLORIDE 0.9 % IV SOLN: INTRAVENOUS | Status: DC

## 2024-09-08 MED ORDER — MIDAZOLAM HCL (PF) 2 MG/2ML IJ SOLN
INTRAMUSCULAR | Status: AC | PRN
  Administered 2024-09-08: 1 mg via INTRAVENOUS

## 2024-09-08 MED ORDER — FENTANYL CITRATE (PF) 100 MCG/2ML IJ SOLN
INTRAMUSCULAR | Status: AC | PRN
  Administered 2024-09-08: 50 ug via INTRAVENOUS

## 2024-09-08 MED ORDER — FENTANYL CITRATE (PF) 100 MCG/2ML IJ SOLN
INTRAMUSCULAR | Status: AC
  Filled 2024-09-08: qty 2

## 2024-09-08 MED ORDER — MIDAZOLAM HCL 2 MG/2ML IJ SOLN
INTRAMUSCULAR | Status: AC
  Filled 2024-09-08: qty 2
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
## 2024-09-08 NOTE — Procedures (Signed)
 Vascular and Interventional Radiology Procedure Note  Patient: William Webb  DOB: Dec 05, 1950 Medical Record Number: [id number] Note Date/Time: 09/08/24 9:29 AM   Performing Physician: Thom Hall, MD Assistant(s): None  Diagnosis: Thrombocytopenia. Q MM  Procedure: BONE MARROW ASPIRATION and BIOPSY  Anesthesia: Conscious Sedation Complications: None Estimated Blood Loss: Minimal Specimens: Sent for Pathology  Findings:  Successful CT-guided bone marrow aspiration and biopsy A total of 1 cores were obtained. Hemostasis of the tract was achieved using Manual Pressure.  Plan: Bed rest for 1 hours.  See detailed procedure note with images in PACS. The patient tolerated the procedure well without incident or complication and was returned to Recovery in stable condition.    Thom Hall, MD [facility]   Pager. [phone] Clinic. [phone]

## 2024-09-08 NOTE — Discharge Instructions (Signed)
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
## 2024-09-09 ENCOUNTER — Encounter (HOSPITAL_COMMUNITY): Payer: Self-pay

## 2024-09-10 ENCOUNTER — Ambulatory Visit (HOSPITAL_COMMUNITY)
Admission: RE | Admit: 2024-09-10 | Discharge: 2024-09-10 | Disposition: A | Discharge status: Home / Self Care | Payer: Medicare (Managed Care) | Source: Ambulatory Visit | Attending: Cardiology | Admitting: Cardiology

## 2024-09-10 DIAGNOSIS — I5032 Chronic diastolic (congestive) heart failure: Secondary | ICD-10-CM | POA: Diagnosis not present

## 2024-09-10 LAB — MYOCARDIAL AMYLOID PLANAR & SPECT: H/CL Ratio: 0.9

## 2024-09-10 MED ORDER — TECHNETIUM TC 99M PYROPHOSPHATE
19.1000 | Freq: Once | INTRAVENOUS | Status: AC
  Administered 2024-09-10: 19.1 via INTRAVENOUS

## 2024-09-11 ENCOUNTER — Other Ambulatory Visit

## 2024-09-11 ENCOUNTER — Ambulatory Visit: Admitting: Physician Assistant

## 2024-09-15 ENCOUNTER — Other Ambulatory Visit (HOSPITAL_COMMUNITY): Payer: Self-pay

## 2024-09-15 ENCOUNTER — Encounter: Payer: Self-pay | Admitting: Cardiology

## 2024-09-15 ENCOUNTER — Encounter (HOSPITAL_COMMUNITY): Payer: Self-pay

## 2024-09-15 ENCOUNTER — Inpatient Hospital Stay: Payer: Medicare (Managed Care) | Attending: Physician Assistant

## 2024-09-15 ENCOUNTER — Ambulatory Visit: Payer: Medicare (Managed Care) | Attending: Cardiology | Admitting: Cardiology

## 2024-09-15 ENCOUNTER — Other Ambulatory Visit: Payer: Self-pay | Admitting: Hematology and Oncology

## 2024-09-15 ENCOUNTER — Inpatient Hospital Stay: Payer: Medicare (Managed Care) | Admitting: Hematology and Oncology

## 2024-09-15 VITALS — BP 156/75 | HR 69 | Temp 97.9°F | Resp 15 | Ht 73.0 in

## 2024-09-15 VITALS — BP 168/76 | HR 80

## 2024-09-15 DIAGNOSIS — Z89421 Acquired absence of other right toe(s): Secondary | ICD-10-CM | POA: Insufficient documentation

## 2024-09-15 DIAGNOSIS — Z8701 Personal history of pneumonia (recurrent): Secondary | ICD-10-CM | POA: Diagnosis not present

## 2024-09-15 DIAGNOSIS — I480 Paroxysmal atrial fibrillation: Secondary | ICD-10-CM | POA: Diagnosis not present

## 2024-09-15 DIAGNOSIS — Z8619 Personal history of other infectious and parasitic diseases: Secondary | ICD-10-CM | POA: Diagnosis not present

## 2024-09-15 DIAGNOSIS — N186 End stage renal disease: Secondary | ICD-10-CM | POA: Insufficient documentation

## 2024-09-15 DIAGNOSIS — Z79899 Other long term (current) drug therapy: Secondary | ICD-10-CM | POA: Diagnosis not present

## 2024-09-15 DIAGNOSIS — D45 Polycythemia vera: Secondary | ICD-10-CM | POA: Insufficient documentation

## 2024-09-15 DIAGNOSIS — I2510 Atherosclerotic heart disease of native coronary artery without angina pectoris: Secondary | ICD-10-CM | POA: Insufficient documentation

## 2024-09-15 DIAGNOSIS — M810 Age-related osteoporosis without current pathological fracture: Secondary | ICD-10-CM | POA: Diagnosis not present

## 2024-09-15 DIAGNOSIS — Z87891 Personal history of nicotine dependence: Secondary | ICD-10-CM | POA: Insufficient documentation

## 2024-09-15 DIAGNOSIS — E785 Hyperlipidemia, unspecified: Secondary | ICD-10-CM | POA: Diagnosis not present

## 2024-09-15 DIAGNOSIS — Z83438 Family history of other disorder of lipoprotein metabolism and other lipidemia: Secondary | ICD-10-CM | POA: Insufficient documentation

## 2024-09-15 DIAGNOSIS — D472 Monoclonal gammopathy: Secondary | ICD-10-CM | POA: Diagnosis not present

## 2024-09-15 DIAGNOSIS — D6869 Other thrombophilia: Secondary | ICD-10-CM

## 2024-09-15 DIAGNOSIS — D61818 Other pancytopenia: Secondary | ICD-10-CM | POA: Diagnosis not present

## 2024-09-15 DIAGNOSIS — I132 Hypertensive heart and chronic kidney disease with heart failure and with stage 5 chronic kidney disease, or end stage renal disease: Secondary | ICD-10-CM | POA: Insufficient documentation

## 2024-09-15 DIAGNOSIS — Z604 Social exclusion and rejection: Secondary | ICD-10-CM | POA: Insufficient documentation

## 2024-09-15 DIAGNOSIS — Z635 Disruption of family by separation and divorce: Secondary | ICD-10-CM | POA: Insufficient documentation

## 2024-09-15 DIAGNOSIS — I5020 Unspecified systolic (congestive) heart failure: Secondary | ICD-10-CM

## 2024-09-15 DIAGNOSIS — I252 Old myocardial infarction: Secondary | ICD-10-CM | POA: Insufficient documentation

## 2024-09-15 DIAGNOSIS — Z89512 Acquired absence of left leg below knee: Secondary | ICD-10-CM | POA: Insufficient documentation

## 2024-09-15 DIAGNOSIS — Z800 Family history of malignant neoplasm of digestive organs: Secondary | ICD-10-CM | POA: Insufficient documentation

## 2024-09-15 DIAGNOSIS — Z8249 Family history of ischemic heart disease and other diseases of the circulatory system: Secondary | ICD-10-CM | POA: Insufficient documentation

## 2024-09-15 DIAGNOSIS — Z7901 Long term (current) use of anticoagulants: Secondary | ICD-10-CM | POA: Insufficient documentation

## 2024-09-15 DIAGNOSIS — Z992 Dependence on renal dialysis: Secondary | ICD-10-CM | POA: Insufficient documentation

## 2024-09-15 DIAGNOSIS — I350 Nonrheumatic aortic (valve) stenosis: Secondary | ICD-10-CM

## 2024-09-15 DIAGNOSIS — E1122 Type 2 diabetes mellitus with diabetic chronic kidney disease: Secondary | ICD-10-CM | POA: Diagnosis not present

## 2024-09-15 DIAGNOSIS — D649 Anemia, unspecified: Secondary | ICD-10-CM

## 2024-09-15 DIAGNOSIS — Z9841 Cataract extraction status, right eye: Secondary | ICD-10-CM | POA: Diagnosis not present

## 2024-09-15 DIAGNOSIS — Z833 Family history of diabetes mellitus: Secondary | ICD-10-CM | POA: Insufficient documentation

## 2024-09-15 DIAGNOSIS — Z8419 Family history of other disorders of kidney and ureter: Secondary | ICD-10-CM | POA: Insufficient documentation

## 2024-09-15 LAB — CMP (CANCER CENTER ONLY)
ALT: 7 U/L (ref 0–44)
AST: 17 U/L (ref 15–41)
Albumin: 4.2 g/dL (ref 3.5–5.0)
Alkaline Phosphatase: 142 U/L — ABNORMAL HIGH (ref 38–126)
Anion gap: 13 (ref 5–15)
BUN: 32 mg/dL — ABNORMAL HIGH (ref 8–23)
CO2: 30 mmol/L (ref 22–32)
Calcium: 8.9 mg/dL (ref 8.9–10.3)
Chloride: 97 mmol/L — ABNORMAL LOW (ref 98–111)
Creatinine: 5.42 mg/dL — ABNORMAL HIGH (ref 0.61–1.24)
GFR, Estimated: 10 mL/min — ABNORMAL LOW (ref >60)
Glucose, Bld: 113 mg/dL — ABNORMAL HIGH (ref 70–99)
Potassium: 5 mmol/L (ref 3.5–5.1)
Sodium: 139 mmol/L (ref 135–145)
Total Bilirubin: 0.6 mg/dL (ref 0.0–1.2)
Total Protein: 7.9 g/dL (ref 6.5–8.1)

## 2024-09-15 LAB — CBC WITH DIFFERENTIAL (CANCER CENTER ONLY)
Abs Immature Granulocytes: 0 K/uL (ref 0.00–0.07)
Basophils Absolute: 0 K/uL (ref 0.0–0.1)
Basophils Relative: 1 %
Eosinophils Absolute: 0.1 K/uL (ref 0.0–0.5)
Eosinophils Relative: 3 %
HCT: 29 % — ABNORMAL LOW (ref 39.0–52.0)
Hemoglobin: 9 g/dL — ABNORMAL LOW (ref 13.0–17.0)
Immature Granulocytes: 0 %
Lymphocytes Relative: 17 %
Lymphs Abs: 0.5 K/uL — ABNORMAL LOW (ref 0.7–4.0)
MCH: 28.7 pg (ref 26.0–34.0)
MCHC: 31 g/dL (ref 30.0–36.0)
MCV: 92.4 fL (ref 80.0–100.0)
Monocytes Absolute: 0.3 K/uL (ref 0.1–1.0)
Monocytes Relative: 11 %
Neutro Abs: 2 K/uL (ref 1.7–7.7)
Neutrophils Relative %: 68 %
Platelet Count: 70 K/uL — ABNORMAL LOW (ref 150–400)
RBC: 3.14 MIL/uL — ABNORMAL LOW (ref 4.22–5.81)
RDW: 19.3 % — ABNORMAL HIGH (ref 11.5–15.5)
WBC Count: 3 K/uL — ABNORMAL LOW (ref 4.0–10.5)
nRBC: 0 % (ref 0.0–0.2)

## 2024-09-15 LAB — LACTATE DEHYDROGENASE: LDH: 195 U/L (ref 105–235)

## 2024-09-15 MED ORDER — APIXABAN 2.5 MG PO TABS: 2.5000 mg | ORAL_TABLET | Freq: Two times a day (BID) | ORAL | 3 refills | Status: DC

## 2024-09-15 MED ORDER — APIXABAN 2.5 MG PO TABS
2.5000 mg | ORAL_TABLET | Freq: Two times a day (BID) | ORAL | 11 refills | Status: DC
  Filled 2024-09-15: qty 60, 30d supply, fill #0

## 2024-09-15 MED ORDER — VITAMIN B-12 1000 MCG PO TABS: 1000.0000 ug | ORAL_TABLET | Freq: Every day | ORAL | 1 refills | Status: AC
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
## 2024-09-15 NOTE — Patient Instructions (Signed)
 Medication Instructions:  Your physician has recommended you make the following change in your medication:  1) STOP taking aspirin   2) START taking Eliquis  2.5 mg twice daily   *If you need a refill on your cardiac medications before your next appointment, please call your pharmacy*  Testing/Procedures: Cardiac CT  Your physician has requested that you have cardiac CT. Cardiac computed tomography (CT) is a painless test that uses an x-ray machine to take clear, detailed pictures of your heart. For further information please visit [email or web address] Please follow instruction sheet as given.  Follow-Up: At [facility], you and your health needs are our priority.  As part of our continuing mission to provide you with exceptional heart care, our providers are all part of one team.  This team includes your primary Cardiologist (physician) and Advanced Practice Providers or APPs (Physician Assistants and Nurse Practitioners) who all work together to provide you with the care you need, when you need it.  Your next appointment:   4-6 weeks  Provider:   Fonda Kitty, MD

## 2024-09-15 NOTE — Progress Notes (Signed)
 Electrophysiology Office Note:   Date:  09/15/2024  ID:  William Webb , DOB [birth year], MRN [id number]  Primary Cardiologist: Newman JINNY Lawrence, MD Electrophysiologist: Fonda Kitty, MD      History of Present Illness:   William Webb  is a 73 y.o. male with h/o HTN, DMII, CAD, AS, chronic HFpEF, bilateral BKA,and ESRD on HD who is being seen today for evaluation for Watchman device.   Discussed the use of AI scribe software for clinical note transcription with the patient, who gave verbal consent to proceed.  History of Present Illness William Webb  is a 73 year old male with atrial fibrillation who presents with concerns about bleeding and anticoagulation management.  He experiences irregular heartbeats, describing the sensation as the 'top part of my heart is beating out of rhythm.' He has a history of atrial fibrillation and was previously on Eliquis  for stroke prevention. However, he discontinued Eliquis  due to excessive bleeding, particularly after dialysis sessions, where he noted difficulty in clotting after needle removal.  Since stopping Eliquis , the bleeding has improved. He has been getting off dialysis treatment 10-15 minutes early to allow time for clotting, which has helped manage the bleeding. He is currently not taking any anticoagulants or aspirin , having stopped both Eliquis  and aspirin . He recalls being on Plavix  in the past along with Eliquis .  He is concerned about the risk of stroke due to his atrial fibrillation and is interested in alternatives to manage this risk without excessive bleeding. He mentions a history of low platelet counts, which may contribute to his bleeding issues. He is cautious about using razors due to the risk of cuts and bleeding, opting for an electric shaver instead.   Review of systems complete and found to be negative unless listed in HPI.   EP Information / Studies Reviewed:    EKG is not ordered today. EKG  from 01/07/24 reviewed which showed SR with PVCs.     07/10/2019: AF    Cardiac PET 07/21/24:    Reversible inferior perfusion defect consistent with ischemia.  Myocardial blood flow reserve is reduced globally and in each coronary distribution but unreliable in setting of patient's ESRD, prior coronary stenting, and severe systolic heart failure.  LVEF is severely reduced (24% at rest, increases to 32% with stress).  No high risk findings such as TID or drop in EF with stress.  Overall, study suggests RCA territory ischemia and is intermediate risk   LV perfusion is abnormal. There is evidence of ischemia. Defect 1: There is a small defect with moderate reduction in uptake present in the apical to mid inferior location(s) that is reversible. There is abnormal wall motion in the defect area. Consistent with ischemia.   Rest left ventricular function is abnormal. Rest global function is severely reduced. Rest EF: 24%. Stress left ventricular function is abnormal. Stress global function is moderately reduced. Stress EF: 32%. End diastolic cavity size is moderately enlarged. End systolic cavity size is severely enlarged.   Myocardial blood flow reserve is not reported in this patient due to technical or patient-specific concerns that affect accuracy.   Coronary calcium  assessment not performed due to prior revascularization.   Findings are consistent with ischemia. The study is intermediate risk.  Nuclear Amyloid Study 09/10/24:    Findings are not suggestive of cardiac ATTR amyloidosis. The myocardium  was negative for radiotracer uptake.    The visual grade of myocardial uptake relative to the ribs was Grade 0  (No myocardial uptake and  normal bone uptake).    CT images were obtained for attenuation correction and were examined  for the presence of coronary calcium  when appropriate.    Coronary calcium  assessment not performed due to prior  revascularization.   Echo 05/12/24:  1. Visual  appearance and strain pattern concerning for amyloid. Left  ventricular ejection fraction, by estimation, is 35 to 40%. The left  ventricle has moderately decreased function. The left ventricle  demonstrates global hypokinesis. There is moderate  concentric left ventricular hypertrophy. Left ventricular diastolic  parameters are consistent with Grade II diastolic dysfunction  (pseudonormalization). The average left ventricular global longitudinal  strain is -9.2 %. The global longitudinal strain is  abnormal.   2. Right ventricular systolic function is low normal. The right  ventricular size is mildly enlarged. There is severely elevated pulmonary  artery systolic pressure.   3. Left atrial size was severely dilated.   4. Right atrial size was severely dilated.   5. A small pericardial effusion is present. The pericardial effusion is  circumferential. There is no evidence of cardiac tamponade.   6. The mitral valve is normal in structure. Mild mitral valve  regurgitation. No evidence of mitral stenosis.   7. Low flow based on stroke volume, so gradient may inaccurately predict  degree of stenosis. Mild stenosis by gradient, moderate by DI (0.28). The  aortic valve is tricuspid. There is moderate calcification of the aortic  valve. Aortic valve regurgitation   is not visualized. Mild to moderate aortic valve stenosis. Aortic valve  mean gradient measures 12.0 mmHg.   8. The inferior vena cava is dilated in size with <50% respiratory  variability, suggesting right atrial pressure of 15 mmHg.   Risk Assessment/Calculations:    CHA2DS2-VASc Score = 5   This indicates a 7.2% annual risk of stroke. The patient's score is based upon: CHF History: 1 HTN History: 1 Diabetes History: 1 Stroke History: 0 Vascular Disease History: 1 Age Score: 1 Gender Score: 0     Physical Exam:   VS:  BP (!) 168/76 (BP Location: Right Arm, Patient Position: Sitting, Cuff Size: Normal)   Pulse 80    SpO2 97%    Wt Readings from Last 3 Encounters:  09/08/24 173 lb (78.5 kg)  08/25/24 171 lb (77.6 kg)  07/28/24 145 lb (65.8 kg)     General: Well developed, in no acute distress.  Neck: No JVD.  Cardiac: Normal rate, regular rhythm. SEM. Resp: Normal work of breathing.  Ext: S/p bilateral BKAs. Neuro: No gross focal deficits.  Psych: Normal affect.   ASSESSMENT AND PLAN:   I have seen William Webb  in the office today who is being considered for a Watchman left atrial appendage closure device. I believe they will benefit from this procedure given their history of atrial fibrillation, CHA2DS2-VASc score of 5 and unadjusted ischemic stroke rate of 4% per year. Unfortunately, the patient is not felt to be a long term anticoagulation candidate secondary to 7.2. The patient's chart has been reviewed and I feel that they would be a candidate for short term oral anticoagulation after Watchman implant.   It is my belief that after undergoing a LAA closure procedure, William Webb  will not need long term anticoagulation which eliminates anticoagulation side effects and major bleeding risk.   Procedural risks for the Watchman implant have been reviewed with the patient including a 0.5% risk of stroke, <1% risk of perforation and <1% risk of device embolization. Other risks include  bleeding, vascular damage, tamponade, worsening renal function, and death. The patient understands these risk and wishes to proceed.     The published clinical data on the safety and effectiveness of WATCHMAN include but are not limited to the following: - Holmes DR, Jess BEARD, Sick P et al. for the PROTECT AF Investigators. Percutaneous closure of the left atrial appendage versus warfarin therapy for prevention of stroke in patients with atrial fibrillation: a randomised non-inferiority trial. Lancet 2009; 374: 534-42. GLENWOOD Jess BEARD, Doshi SK, Jonita VEAR Satchel D et al. on behalf of the PROTECT AF  Investigators. Percutaneous Left Atrial Appendage Closure for Stroke Prophylaxis in Patients With Atrial Fibrillation 2.3-Year Follow-up of the PROTECT AF (Watchman Left Atrial Appendage System for Embolic Protection in Patients With Atrial Fibrillation) Trial. Circulation 2013; 127:720-729. - Alli O, Doshi S,  Kar S, Reddy VY, Sievert H et al. Quality of Life Assessment in the Randomized PROTECT AF (Percutaneous Closure of the Left Atrial Appendage Versus Warfarin Therapy for Prevention of Stroke in Patients With Atrial Fibrillation) Trial of Patients at Risk for Stroke With Nonvalvular Atrial Fibrillation. J Am Coll Cardiol 2013; 61:1790-8. GLENWOOD Satchel DR, Archer RAMAN, Price M, Whisenant B, Sievert H, Doshi S, Huber K, Reddy V. Prospective randomized evaluation of the Watchman left atrial appendage Device in patients with atrial fibrillation versus long-term warfarin therapy; the PREVAIL trial. Journal of the [employer] of Cardiology, Vol. 4, No. 1, 2014, 1-11. - Kar S, Doshi SK, Sadhu A, Horton R, Osorio J et al. Primary outcome evaluation of a next-generation left atrial appendage closure device: results from the PINNACLE FLX trial. Circulation 2021;143(18)1754-1762.   HAS-BLED score 4 Hypertension Yes  Abnormal renal and liver function (Dialysis, transplant, Cr >2.26 mg/dL /Cirrhosis or Bilirubin >2x Normal or AST/ALT/AP >3x Normal) Yes  Stroke No  Bleeding Yes  Labile INR (Unstable/high INR) No  Elderly (>65) Yes  Drugs or alcohol (>= 8 drinks/week, anti-plt or NSAID) No   CHA2DS2-VASc Score = 5  The patient's score is based upon: CHF History: 1 HTN History: 1 Diabetes History: 1 Stroke History: 0 Vascular Disease History: 1 Age Score: 1 Gender Score: 0       ASSESSMENT AND PLAN:  #Paroxysmal atrial fibrillation:  #Hypercoagulable state due to atrial fibrillation:  - In order for us  to perform Watchman device implant, need to know that he can tolerate either short-term low-dose  anticoagulation or dual antiplatelet therapy.  Given his thrombocytopenia, likely related to ESRD.  Prefer Eliquis  2.5 mg twice daily over dual antiplatelet therapy.  We will start Eliquis  2.5 mg twice daily and have patient follow-up in 1 month.  If he is tolerating this regimen, then Watchman device implant can be pursued.  We will schedule CT of the left atrial appendage in the interim to assess candidacy.  #Chronic HFrEF: Echo LVEF 35-40%.  Recent amyloid study negative.  Well compensated on exam today. #Moderate aortic stenosis: Can more closely assess with TEE at time of watchman implant, if pursued. -GDMT limited by ESRD. Continue coreg  6.25 mg twice a day.  Continue Entresto  97-103 mg twice daily. -Volume status managed with dialysis.   #. CAD: 2019 cath with stent LAD, LCX, and PTCA RCA.  Denies chest pain. -Continue atorvastatin .  No longer taking aspirin .  Start Eliquis  2.5 mg twice daily as above.   Follow up with Dr. Kennyth in 4 weeks  Signed, Fonda Kennyth, MD

## 2024-09-16 NOTE — Progress Notes (Signed)
 Cardiology Office Note:  .   Date:  09/16/2024  ID:  William Webb , DOB 1951/02/13, MRN [id number] PCP: [facility] Providers Cardiologist:  Newman Lawrence, MD PCP: [facility]  Chief Complaint  Patient presents with   PAF      History of Present Illness: .    William Webb  is a 73 y.o. male with hypertension, hyperlipidemia, type 2 DM,  ESRD on HD, mod AS, PAF, CAD, PAD (s/p b/l BKA), anemia, HFrEF  Patient was seen by EP and heart failure clinic. He was recommended a trial of Eliquis  2.5 mg bid to ensure no recurrent bleeding before proceeding with Watchman. Workup for amyloid has been negative so far, although MRI is pending.   He has not started Eliquis  yet as he reports that he has not received it from the pharmacy. He is doing well with his breathing.  He is also seeing Neurology for bilateral hand numbness, thought to be related to his diabetes.   Vitals:   09/17/24 1434  BP: (!) 153/72  Pulse: 78  SpO2: 95%      ROS:  Review of Systems  Cardiovascular:  Negative for chest pain, dyspnea on exertion, leg swelling, palpitations and syncope.     Studies Reviewed: SABRA        EKG 11/28/2023: Sinus rhythm with 1st degree A-V block with occasional Premature ventricular complexes Nonspecific T wave abnormality Prolonged QT When compared with ECG of 20-May-2023 21:46, No significant change was found  PYP scan 08/2024:   Findings are not suggestive of cardiac ATTR amyloidosis. The myocardium  was negative for radiotracer uptake.    The visual grade of myocardial uptake relative to the ribs was Grade 0  (No myocardial uptake and normal bone uptake).    CT images were obtained for attenuation correction and were examined  for the presence of coronary calcium  when appropriate.    Coronary calcium  assessment not performed due to prior  revascularization.    Stress test 06/2024:   Reversible inferior perfusion defect consistent with ischemia.  Myocardial blood flow reserve is reduced globally and in each coronary distribution but unreliable in setting of patient's ESRD, prior coronary stenting, and severe systolic heart failure.  LVEF is severely reduced (24% at rest, increases to 32% with stress).  No high risk findings such as TID or drop in EF with stress.  Overall, study suggests RCA territory ischemia and is intermediate risk.   LV perfusion is abnormal. There is evidence of ischemia. Defect 1: There is a small defect with moderate reduction in uptake present in the apical to mid inferior location(s) that is reversible. There is abnormal wall motion in the defect area. Consistent with ischemia.   Rest left ventricular function is abnormal. Rest global function is severely reduced. Rest EF: 24%. Stress left ventricular function is abnormal. Stress global function is moderately reduced. Stress EF: 32%. End diastolic cavity size is moderately enlarged. End systolic cavity size is severely enlarged.   Myocardial blood flow reserve is not reported in this patient due to technical or patient-specific concerns that affect accuracy.   Coronary calcium  assessment not performed due to prior revascularization.   Findings are consistent with ischemia. The study is intermediate risk.  Echocardiogram 04/2024:  1. Visual appearance and strain pattern concerning for amyloid. Left  ventricular ejection fraction, by estimation, is 35 to 40%. The left  ventricle has moderately decreased function.  The left ventricle  demonstrates global hypokinesis. There is moderate  concentric left ventricular hypertrophy. Left ventricular diastolic  parameters are consistent with Grade II diastolic dysfunction  (pseudonormalization). The average left ventricular global longitudinal  strain is -9.2 %. The global longitudinal strain is  abnormal.   2. Right ventricular systolic  function is low normal. The right  ventricular size is mildly enlarged. There is severely elevated pulmonary  artery systolic pressure.   3. Left atrial size was severely dilated.   4. Right atrial size was severely dilated.   5. A small pericardial effusion is present. The pericardial effusion is  circumferential. There is no evidence of cardiac tamponade.   6. The mitral valve is normal in structure. Mild mitral valve  regurgitation. No evidence of mitral stenosis.   7. Low flow based on stroke volume, so gradient may inaccurately predict  degree of stenosis. Mild stenosis by gradient, moderate by DI (0.28). The  aortic valve is tricuspid. There is moderate calcification of the aortic  valve. Aortic valve regurgitation   is not visualized. Mild to moderate aortic valve stenosis. Aortic valve  mean gradient measures 12.0 mmHg.   8. The inferior vena cava is dilated in size with <50% respiratory  variability, suggesting right atrial pressure of 15 mmHg.   Comparison(s): Prior images reviewed side by side. EF reduced compared to  prior. Appearance and LV strain concerning for amyloid. Mild to moderate  aortic stenosis given low stroke volume.   Labs 06/2024: Chol 109, TG 45, HDL 53, LDL 45 Cr 4.6 ProBNP >70,000   Labs 03/2024: Hb 9.1 Cr 4.22, Albumin  3.0   Labs 01/2024: Chol 97, TG 119, HDL 37, LDL 39 HbA1C 6.6% Hb 9.1 Cr 4.2  10/31/2023: Chol 95, TG 87, HDL 29, LDL 49  Risk Assessment/Calculations:    CHA2DS2-VASc Score = 5  This indicates a 7.2% annual risk of stroke. The patient's score is based upon: CHF History: 1 HTN History: 1 Diabetes History: 1 Stroke History: 0 Vascular Disease History: 1 Age Score: 1 Gender Score: 0     Physical Exam:   Physical Exam Vitals and nursing note reviewed.  Constitutional:      General: He is not in acute distress. Neck:     Vascular: No JVD.  Cardiovascular:     Rate and Rhythm: Normal rate and regular rhythm.      Heart sounds: Normal heart sounds. No murmur heard. Pulmonary:     Effort: Pulmonary effort is normal.     Breath sounds: Normal breath sounds. No wheezing or rales.  Musculoskeletal:     Comments: B/l BKA      VISIT DIAGNOSES:   ICD-10-CM   1. Paroxysmal atrial fibrillation (HCC)  I48.0     2. HFrEF (heart failure with reduced ejection fraction) (HCC)  I50.20     3. Coronary artery disease of native artery of native heart with stable angina pectoris  I25.118     4. PAD (peripheral artery disease)  I73.9            ASSESSMENT AND PLAN: .    William Webb  is a 73 y.o. male with hypertension, hyperlipidemia, type 2 DM,  ESRD on HD, mod AS, PAF, CAD, PAD (s/p b/l BKA), anemia, HFrEF  HFrEF: Workup for amyloidosis negative so far. MRI pending. Increase carvedilol  to 12.5 mg bid. Continue Entresto  97-103 mg bid, lasix  40 mg bid. He is also on amlodipine  5 mg daily for hypertension.  PAD: B/l BKA.  Aortic stenosis: Moderate. Suspect that this could be related to cardiac amyloidosis. Further workup for amyloidosis as per heart failure recommendations.  PAF: CHA2DS2VASc score 4, annual stroke risk 5%. Recent prolonged bleeding after dialysis due to AV fistula related issues. Currently, on Eliquis  2.5 bid. Dr. Kennyth recommended assessing tolerance on this dose before offering Watchman device placement.  CAD: No angina symptoms. Ischemia in RCA territory, known RPDA occlusion in the past. In absence of anginal symptoms, continue medical management. Continue aspirin , statin, carvedilol .  Hypertension: Controlled.   Meds ordered this encounter  Medications   carvedilol  (COREG ) 12.5 MG tablet    Sig: Take 1 tablet (12.5 mg total) by mouth 2 (two) times daily with a meal.    Dispense:  180 tablet    Refill:  3    F/u w/heart failure and EP clinics in next 2-3 months F/u w/me in 6 months   Signed, Newman JINNY Lawrence, MD

## 2024-09-17 ENCOUNTER — Other Ambulatory Visit (HOSPITAL_COMMUNITY): Payer: Self-pay

## 2024-09-17 ENCOUNTER — Encounter (HOSPITAL_COMMUNITY): Payer: Self-pay

## 2024-09-17 ENCOUNTER — Ambulatory Visit: Payer: Medicare (Managed Care) | Attending: Cardiology | Admitting: Cardiology

## 2024-09-17 ENCOUNTER — Encounter: Payer: Self-pay | Admitting: Cardiology

## 2024-09-17 VITALS — BP 153/72 | HR 78 | Ht 75.0 in | Wt 175.0 lb

## 2024-09-17 DIAGNOSIS — I5020 Unspecified systolic (congestive) heart failure: Secondary | ICD-10-CM | POA: Diagnosis not present

## 2024-09-17 DIAGNOSIS — I480 Paroxysmal atrial fibrillation: Secondary | ICD-10-CM | POA: Diagnosis not present

## 2024-09-17 DIAGNOSIS — I25118 Atherosclerotic heart disease of native coronary artery with other forms of angina pectoris: Secondary | ICD-10-CM

## 2024-09-17 DIAGNOSIS — I739 Peripheral vascular disease, unspecified: Secondary | ICD-10-CM

## 2024-09-17 MED ORDER — CARVEDILOL 12.5 MG PO TABS
12.5000 mg | ORAL_TABLET | Freq: Two times a day (BID) | ORAL | 3 refills | Status: DC
  Filled 2024-09-17: qty 180, 90d supply, fill #0

## 2024-09-17 NOTE — Patient Instructions (Addendum)
 Medication Instructions:  Increase Carvedilol  12.5 mg twice daily   *If you need a refill on your cardiac medications before your next appointment, please call your pharmacy*  Follow-Up: At [facility], you and your health needs are our priority.  As part of our continuing mission to provide you with exceptional heart care, our providers are all part of one team.  This team includes your primary Cardiologist (physician) and Advanced Practice Providers or APPs (Physician Assistants and Nurse Practitioners) who all work together to provide you with the care you need, when you need it.  Your next appointment:   6 month(s)  Provider:   Newman JINNY Lawrence, MD

## 2024-09-18 ENCOUNTER — Telehealth: Payer: Self-pay

## 2024-09-18 ENCOUNTER — Encounter (HOSPITAL_COMMUNITY): Payer: Self-pay | Admitting: Physician Assistant

## 2024-09-18 ENCOUNTER — Encounter: Payer: Self-pay | Admitting: Cardiology

## 2024-09-18 NOTE — Telephone Encounter (Signed)
 Spoke with patient to arrange CT scan ordered by Dr. Kennyth for [facility] evaluation. Patient needed scan to be on Tuesday or Thursday due to transportation and multiple doctor appointments.   Arranged scan 10/15/24. He has follow up with Dr. Kennyth 11/03/2024.  He is an established dialysis patient.   He had lab work completed 09/15/24.

## 2024-09-21 ENCOUNTER — Other Ambulatory Visit: Payer: Self-pay

## 2024-09-21 MED ORDER — CARVEDILOL 25 MG PO TABS: 25.0000 mg | ORAL_TABLET | Freq: Two times a day (BID) | ORAL | Status: AC

## 2024-09-21 NOTE — Progress Notes (Signed)
 Contacted patient and confirmed that he is taking carvedilol  25 mg twice daily. Prescription list has been updated.

## 2024-09-22 ENCOUNTER — Ambulatory Visit (HOSPITAL_COMMUNITY): Payer: Medicare (Managed Care)

## 2024-09-28 ENCOUNTER — Telehealth (HOSPITAL_COMMUNITY): Payer: Self-pay | Admitting: Cardiology

## 2024-09-28 ENCOUNTER — Ambulatory Visit (HOSPITAL_COMMUNITY): Payer: Medicare (Managed Care) | Admitting: Cardiology

## 2024-09-28 ENCOUNTER — Encounter (HOSPITAL_COMMUNITY): Payer: Self-pay

## 2024-09-28 NOTE — Telephone Encounter (Signed)
 Called to confirm/remind patient of their appointment at the [facility] on 09/28/2024.   Appointment:   [] Confirmed  [] Left mess   [x] No answer/No voice mail  [] VM Full/unable to leave message  [] Phone not in service  Patient reminded to bring all medications and/or complete list.  Confirmed patient has transportation. Gave directions, instructed to utilize valet parking.

## 2024-09-29 ENCOUNTER — Ambulatory Visit (HOSPITAL_COMMUNITY)
Admission: RE | Admit: 2024-09-29 | Discharge: 2024-09-29 | Disposition: A | Discharge status: Home / Self Care | Payer: Medicare (Managed Care) | Source: Ambulatory Visit | Attending: Cardiology | Admitting: Cardiology

## 2024-09-29 VITALS — BP 149/72 | HR 62

## 2024-09-29 DIAGNOSIS — Z89512 Acquired absence of left leg below knee: Secondary | ICD-10-CM | POA: Insufficient documentation

## 2024-09-29 DIAGNOSIS — I5020 Unspecified systolic (congestive) heart failure: Secondary | ICD-10-CM | POA: Diagnosis not present

## 2024-09-29 DIAGNOSIS — Z993 Dependence on wheelchair: Secondary | ICD-10-CM | POA: Insufficient documentation

## 2024-09-29 DIAGNOSIS — I132 Hypertensive heart and chronic kidney disease with heart failure and with stage 5 chronic kidney disease, or end stage renal disease: Secondary | ICD-10-CM | POA: Insufficient documentation

## 2024-09-29 DIAGNOSIS — Z79899 Other long term (current) drug therapy: Secondary | ICD-10-CM | POA: Insufficient documentation

## 2024-09-29 DIAGNOSIS — I429 Cardiomyopathy, unspecified: Secondary | ICD-10-CM | POA: Insufficient documentation

## 2024-09-29 DIAGNOSIS — I080 Rheumatic disorders of both mitral and aortic valves: Secondary | ICD-10-CM | POA: Insufficient documentation

## 2024-09-29 DIAGNOSIS — E1122 Type 2 diabetes mellitus with diabetic chronic kidney disease: Secondary | ICD-10-CM | POA: Insufficient documentation

## 2024-09-29 DIAGNOSIS — E8770 Fluid overload, unspecified: Secondary | ICD-10-CM | POA: Insufficient documentation

## 2024-09-29 DIAGNOSIS — Z955 Presence of coronary angioplasty implant and graft: Secondary | ICD-10-CM | POA: Insufficient documentation

## 2024-09-29 DIAGNOSIS — Z7901 Long term (current) use of anticoagulants: Secondary | ICD-10-CM | POA: Insufficient documentation

## 2024-09-29 DIAGNOSIS — I2510 Atherosclerotic heart disease of native coronary artery without angina pectoris: Secondary | ICD-10-CM | POA: Insufficient documentation

## 2024-09-29 DIAGNOSIS — I5032 Chronic diastolic (congestive) heart failure: Secondary | ICD-10-CM | POA: Diagnosis not present

## 2024-09-29 DIAGNOSIS — I480 Paroxysmal atrial fibrillation: Secondary | ICD-10-CM | POA: Insufficient documentation

## 2024-09-29 DIAGNOSIS — I5022 Chronic systolic (congestive) heart failure: Secondary | ICD-10-CM | POA: Insufficient documentation

## 2024-09-29 DIAGNOSIS — Z89511 Acquired absence of right leg below knee: Secondary | ICD-10-CM | POA: Insufficient documentation

## 2024-09-29 DIAGNOSIS — Z992 Dependence on renal dialysis: Secondary | ICD-10-CM | POA: Insufficient documentation

## 2024-09-29 DIAGNOSIS — N186 End stage renal disease: Secondary | ICD-10-CM | POA: Insufficient documentation

## 2024-09-29 NOTE — Progress Notes (Signed)
 Primary Care: [facility] HR Cardiology: Dr. Rolan  Chief Complaint: Heart Failure   HPI: William Webb  is a 73 y.o. with a history of HTN, DM II, CAD, aortic stenosis, systolic CHF, bilateral BKA,and ESRD since 2023. He relocated back to [facility] in 2020, had been living in [place] for >30 years prior to that.  He worked in a pharmacist, hospital.   Last cath was in 2019, showing 70% distal LAD, patent D1 stent but with 90% ostial D1, patent OM1 stent, occluded PDA.  Attempt was made to intervene on PDA but was unsuccessful (PTCA with residual 99% stenosis).  Cardiac PET in 9/25 showed inferior ischemia likely related to known severe PDA disease.   Last echo in 7/25 showed EF 35-40%, moderate LVH, mildly dilated RV with low normal systolic function, severe biatrial enlargement, mild William, mild-moderate AS with mean gradient 12 but DI 0.28, IVC dilated. Workup for amyloidosis was done, PYP scan in 11/25 was negative (grade 0).  Patient was found to have an IgG monoclonal protein.  He saw Dr. Federico, bone marrow biopsy in 10/25 was normocellular with 9% plasma cells, no evidence for plasma cell neoplasm. The sample does not appear to have been stained for amyloidosis. Bone metastatic survey was negative in 10/25.   Patient has had problems with excessive bleeding from his HD access site. He has been anticoagulated for paroxysmal atrial fibrillation.  He has seen EP, planned for Watchman as long as he can tolerate apixaban  2.5 bid.  He is doing fine on this so far.   I am seeing the patient for the first time today and reviewed all his records.  Patient is generally wheelchair-bound with his bilateral BKAs.  He does have prosthetics and is able to stand and do a little walking with a walker with help.  His goal is to start walking more.  [facility]  No dyspnea wheeling his chair or with transfers. Still urinates and takes Lasix  on non-HD days. He does  not feel palpitations.  Has neuropathy involving his hands and has lost grip strength. BP mildly elevated today, generally does not get hypotensive with HD. No chest pain.   ECG (personally reviewed): NSR, 1st degree AVB, LVH.   Labs (9/25): pro-BNP > 70K, LDL 45 Labs (10/25): myeloma panel with IgG monoclonal protein, Kappa/lambda radio 11.46 Labs (11/25): hgb 9, plts 70  PMH: 1. ESRD since 2023 2. PAD: s/p bilateral BKA.  3. Thrombocytopenia 4. Atrial fibrillation: Paroxysmal.  5. HTN 6. Type 2 diabetes 7. 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  8. MGUS: IgG monoclonal protein.  - Bone marrow biopsy 10/25 was normocellular with 9% plasma cells, no evidence for plasma cell neoplasm. The sample does not appear to have been stained for amyloidosis. - Bone metastatic survey negative in 10/25.  9. CAD: Most recent cath in 2019 showed 70% distal LAD, patent D1 stent but with 90% ostial D1, patent OM1 stent, occluded PDA.  Attempt was made to intervene on PDA but was unsuccessful (PTCA with residual 99% stenosis).  - Cardiac PET (9/25):  inferior ischemia likely related to known severe PDA disease.  10. Chronic systolic CHF: ?Ischemic cardiomyopathy.  - Echo (7/25): EF 35-40%, moderate LVH, mildly dilated RV with low normal systolic function, severe biatrial enlargement, mild William, mild-moderate AS with mean gradient 12 but DI 0.28, IVC dilated.  - PYP scan 11/25: Grade 0.  11. Aortic  stenosis: Mild to moderate on 7/25 echo.   Current Outpatient Medications  Medication Sig Dispense Refill   acetaminophen  (TYLENOL ) 325 MG tablet Take 1-2 tablets (325-650 mg total) by mouth every 6 (six) hours as needed for mild pain (pain score 1-3 or temp > 100.5).     amLODipine  (NORVASC ) 5 MG tablet Take 5 mg by mouth at bedtime.     apixaban  (ELIQUIS ) 2.5 MG TABS tablet Take 1 tablet (2.5 mg total) by mouth 2 (two) times daily. 180 tablet 3   atorvastatin  (LIPITOR ) 20 MG tablet  Take 1 tablet (20 mg total) by mouth daily. 90 tablet 3   carvedilol  (COREG ) 25 MG tablet Take 1 tablet (25 mg total) by mouth 2 (two) times daily with a meal.     cyanocobalamin  (VITAMIN B12) 1000 MCG tablet Take 1 tablet (1,000 mcg total) by mouth daily. 90 tablet 1   finasteride  (PROSCAR ) 5 MG tablet Take 5 mg by mouth daily.     furosemide  (LASIX ) 40 MG tablet Take 1 tablet (40 mg total) by mouth 2 (two) times daily.     gabapentin  (NEURONTIN ) 300 MG capsule Take 300 mg by mouth at bedtime.     insulin  aspart (NOVOLOG ) 100 UNIT/ML injection Inject 3 Units into the skin daily as needed for high blood sugar.     melatonin 5 MG TABS Take 5 mg by mouth at bedtime.     Methoxy PEG-Epoetin  Beta (MIRCERA IJ) 225 mcg.     omeprazole  (PRILOSEC ) 20 MG capsule Take 20 mg by mouth daily.     oxyCODONE  (OXY IR/ROXICODONE ) 5 MG immediate release tablet Take 1 tablet (5 mg total) by mouth every 4 (four) hours as needed for severe pain (pain score 7-10). 20 tablet 0   sacubitril -valsartan  (ENTRESTO ) 97-103 MG Take 1 tablet by mouth 2 (two) times daily. 180 tablet 3   sevelamer  carbonate (RENVELA ) 800 MG tablet Take 2 tablets (1,600 mg total) by mouth 3 (three) times daily with meals.     tamsulosin  (FLOMAX ) 0.4 MG CAPS capsule Take 0.4 mg by mouth at bedtime.     No current facility-administered medications for this encounter.    No Known Allergies    Social History   Socioeconomic History   Marital status: Legally Separated    Spouse name: Not on file   Number of children: 4   Years of education: Not on file   Highest education level: Not on file  Occupational History   Not on file  Tobacco Use   Smoking status: Former    Current packs/day: 0.00    Average packs/day: 0.3 packs/day for 20.0 years (6.6 ttl pk-yrs)    Types: Cigarettes    Start date: 79    Quit date: 15    Years since quitting: 40.9   Smokeless tobacco: Never  Vaping Use   Vaping status: Never Used  Substance and  Sexual Activity   Alcohol use: Not Currently   Drug use: Not Currently   Sexual activity: Not Currently  Other Topics Concern   Not on file  Social History Narrative   Are you right handed or left handed? Right Handed   Are you currently employed ? Retired since 2016   What is your current occupation?   Do you live at home alone? yes   Who lives with you?    What type of home do you live in: 1 story or 2 story? Lives in a one story floor plan.  Social Drivers of Corporate Investment Banker Strain: Not on file  Food Insecurity: No Food Insecurity (01/07/2024)   Hunger Vital Sign    Worried About Running Out of Food in the Last Year: Never true    Ran Out of Food in the Last Year: Never true  Transportation Needs: No Transportation Needs (01/07/2024)   PRAPARE - Administrator, Civil Service (Medical): No    Lack of Transportation (Non-Medical): No  Physical Activity: Not on file  Stress: Not on file  Social Connections: Socially Isolated (01/07/2024)   Social Connection and Isolation Panel    Frequency of Communication with Friends and Family: More than three times a week    Frequency of Social Gatherings with Friends and Family: More than three times a week    Attends Religious Services: Never    Database Administrator or Organizations: No    Attends Banker Meetings: Never    Marital Status: Divorced  Catering Manager Violence: Not At Risk (01/07/2024)   Humiliation, Afraid, Rape, and Kick questionnaire    Fear of Current or Ex-Partner: No    Emotionally Abused: No    Physically Abused: No    Sexually Abused: No   FH: He has 8 siblings. 2 brothers with heart issues but not sure what kind. He has 4 children with no known cardiac issues. His children have had limited medical care because they don't  trust the  medical system.    Vitals:   09/29/24 0903  BP: (!) 149/72  Pulse: 62  SpO2: 93%   Wt Readings from Last 3 Encounters:  09/17/24  79.4 kg (175 lb)  09/08/24 78.5 kg (173 lb)  08/25/24 77.6 kg (171 lb)    PHYSICAL EXAM: General: NAD Neck: JVP 12-14 cm, no thyromegaly or thyroid  nodule.  Lungs: Clear to auscultation bilaterally with normal respiratory effort. CV: Nondisplaced PMI.  Heart regular S1/S2, no S3/S4, 2/6 SEM RUSB with clear S2.  1+ edema BKA sites.  No carotid bruit.  Abdomen: Soft, nontender, no hepatosplenomegaly, no distention.  Skin: Intact without lesions or rashes.  Neurologic: Alert and oriented x 3.  Psych: Normal affect. Extremities: No clubbing or cyanosis. Bilateral BKAs HEENT: Normal.   ASSESSMENT & PLAN:  1. Chronic systolic CHF: This may be predominantly an ischemic cardiomyopathy.  Echo in 7/25 showed EF 35-40%, moderate LVH, mildly dilated RV with low normal systolic function, severe biatrial enlargement, mild William, mild-moderate AS with mean gradient 12 but DI 0.28, IVC dilated.  Amyloidosis workup was done, PYP scan was negative.  Myeloma panel showed a monoclonal kappa light chain, bone marrow biopsy showed normocellular marrow with no comment on amyloidosis staining. Patient is volume overloaded on exam in setting of ESRD.  NYHA class II-III, wheelchair-bound due to BKAs.  - I would recommend lowering his dry weight at HD.  - Continue current Lasix  on non-HD days.  - Continue Entresto  97/103 bid.  - Continue Coreg  25 mg bid.  - Could consider initiation of Bidil  in the future if BP stays stable with HD.  - He has an upcoming cardiac MRI later this month to look for signs of cardiac amyloidosis.  He does not appear to have TTR cardiac amyloidosis based on negative PYP scan.  I suspect AL amyloidosis is unlikely, but will look for signs of this on cardiac MRI.  I will also see if we can add on amyloid staining to the recent bone marrow biopsy.  2.  Aortic Stenosis: Suspect mild-moderate, no more than moderate, on 7/25 echo.  - Follow over time.   3. ESRD: As above, would like to try to lower  dry weight at HD.  4. HTN: Continue Coreg , Entresto , amlodipine .  5. CAD: Last cath in 2019 with 70% distal LAD, patent D1 stent but with 90% ostial D1, patent OM1 stent, occluded PDA.  Attempt was made to intervene on PDA but was unsuccessful (PTCA with residual 99% stenosis).  Cardiac PET in 9/25 showed inferior ischemia likely related to known severe PDA disease.  - On apixaban , no ASA.  - Continue atorvastatin , good lipids in 9/25.  6. Atrial fibrillation: Paroxysmal.  He is in NSR today.  Currently on Eliquis  but has had excessive dialysis site bleeding.  - Continue current Eliquis .  - Plan for Watchman placement by Dr. Kennyth.   Followup in 6 wks.   I spent 41 minutes reviewing records, interviewing/examining patient, and managing orders.   William Webb  09/29/2024

## 2024-09-29 NOTE — Patient Instructions (Signed)
 Medication Changes:  None, continue current medications   Special Instructions // Education:  We will contact HD center to lower your dry weight  Follow-Up in: 6 weeks   At the [facility], you and your health needs are our priority. We have a designated team specialized in the treatment of Heart Failure. This Care Team includes your primary Heart Failure Specialized Cardiologist (physician), Advanced Practice Providers (APPs- Physician Assistants and Nurse Practitioners), and Pharmacist who all work together to provide you with the care you need, when you need it.   You may see any of the following providers on your designated Care Team at your next follow up:  Dr. Toribio Fuel Dr. Ezra Shuck Dr. Odis Brownie Greig Mosses, NP Caffie Shed, [place] [facility] [place], [place] Beckey Coe, NP Jordan Lee, NP Tinnie Redman, PharmD   Please be sure to bring in all your medications bottles to every appointment.   Need to Contact Us :  If you have any questions or concerns before your next appointment please send us  a message through [place] or call our office at [phone].    TO LEAVE A MESSAGE FOR THE NURSE SELECT OPTION 2, PLEASE LEAVE A MESSAGE INCLUDING: YOUR NAME DATE OF BIRTH CALL BACK NUMBER REASON FOR CALL**this is important as we prioritize the call backs  YOU WILL RECEIVE A CALL BACK THE SAME DAY AS LONG AS YOU CALL BEFORE 4:00 PM

## 2024-09-30 ENCOUNTER — Telehealth (HOSPITAL_COMMUNITY): Payer: Self-pay | Admitting: Cardiology

## 2024-09-30 NOTE — Addendum Note (Signed)
 Encounter addended by: Buell Powell HERO, RN on: 09/30/2024 11:12 AM  Actions taken: Clinical Note Signed

## 2024-09-30 NOTE — Telephone Encounter (Signed)
 Per [facility] pathology BMBx are done at [facility]  Per [place] with [facility] pathology Add on requests should be submitted via email.  Request sent to email provided.

## 2024-09-30 NOTE — Progress Notes (Signed)
 Note about lowering pt's dry wt at HD along with OV note faxed to [facility] at [phone]  Powell Latino, RN, BSN, [address] [facility] Specialty Coordinator [facility]

## 2024-09-30 NOTE — Telephone Encounter (Signed)
-----   Message from Ezra Shuck sent at 09/29/2024  4:39 PM EST ----- Can you contact pathology department please and see if they can run amyloidosis staining on his recent bone marrow biopsy? It looks like this was not done.

## 2024-10-06 ENCOUNTER — Ambulatory Visit (HOSPITAL_COMMUNITY)
Admission: RE | Admit: 2024-10-06 | Discharge: 2024-10-06 | Disposition: A | Payer: Medicare (Managed Care) | Source: Ambulatory Visit | Attending: Adult Health | Admitting: Adult Health

## 2024-10-06 ENCOUNTER — Other Ambulatory Visit (HOSPITAL_COMMUNITY): Payer: Self-pay | Admitting: Adult Health

## 2024-10-06 DIAGNOSIS — I5032 Chronic diastolic (congestive) heart failure: Secondary | ICD-10-CM | POA: Diagnosis not present

## 2024-10-06 LAB — SURGICAL PATHOLOGY

## 2024-10-06 MED ORDER — GADOBUTROL 1 MMOL/ML IV SOLN
10.0000 mL | Freq: Once | INTRAVENOUS | Status: AC | PRN
  Administered 2024-10-06: 10 mL via INTRAVENOUS

## 2024-10-15 ENCOUNTER — Ambulatory Visit (HOSPITAL_COMMUNITY)
Admission: RE | Admit: 2024-10-15 | Discharge: 2024-10-15 | Disposition: A | Payer: Medicare (Managed Care) | Source: Ambulatory Visit | Attending: Cardiovascular Disease | Admitting: Cardiovascular Disease

## 2024-10-15 DIAGNOSIS — I480 Paroxysmal atrial fibrillation: Secondary | ICD-10-CM | POA: Diagnosis present

## 2024-10-15 DIAGNOSIS — I5020 Unspecified systolic (congestive) heart failure: Secondary | ICD-10-CM | POA: Diagnosis present

## 2024-10-15 MED ORDER — IOHEXOL 350 MG/ML SOLN
80.0000 mL | Freq: Once | INTRAVENOUS | Status: AC | PRN
  Administered 2024-10-15: 10:00:00 80 mL via INTRAVENOUS

## 2024-10-26 ENCOUNTER — Telehealth: Payer: Self-pay

## 2024-10-26 NOTE — Telephone Encounter (Signed)
 Fairly circular ostium Max 23.5/ AVG 22.7/ Depth 15.4 Likely use a 27mm device and TruSteer sheath to angle most available depth Inf/Mid TSP RAO 23 CAU 20

## 2024-11-02 NOTE — Progress Notes (Signed)
 " Electrophysiology Office Note:   Date:  11/06/2024  ID:  William Webb , DOB 1951/06/28, MRN [id number]  Primary Cardiologist: Newman JINNY Lawrence, MD Electrophysiologist: Fonda Kitty, MD      History of Present Illness:   William Webb  is a 74 y.o. male with h/o HTN, DMII, CAD, AS, chronic HFpEF, bilateral BKA,and ESRD on HD who is being seen today for evaluation for Watchman device.   Discussed the use of AI scribe software for clinical note transcription with the patient, who gave verbal consent to proceed.  History of Present Illness William Webb  is a 74 year old male with atrial fibrillation who presents for follow-up regarding the Watchman procedure and anticoagulation management.  He is being considered for the Watchman procedure due to his atrial fibrillation. A CT scan of his heart showed favorable anatomy for the procedure. He is currently on a reduced dose of Eliquis  (2.5 mg) and has no significant bleeding issues on this dose. He has adjusted his dialysis schedule to minimize bleeding risk, noting that he had some bleeding yesterday but 'nothing like before'.  He has been told his platelet counts are low. He inquires about the possibility of pausing Eliquis  for dental procedures, such as getting teeth pulled for dentures.  He mentions concerns about gabapentin , which he has been taking for years, and its potential link to dementia. He is on a low dose currently but recalls taking higher doses in the past.  He sings in church and is concerned about his appearance due to dental issues.   Review of systems complete and found to be negative unless listed in HPI.   EP Information / Studies Reviewed:    EKG is not ordered today. EKG from 01/07/24 reviewed which showed SR with PVCs.     07/10/2019: AF    Cardiac PET 07/21/24:    Reversible inferior perfusion defect consistent with ischemia.  Myocardial blood flow reserve is reduced globally and in each  coronary distribution but unreliable in setting of patient's ESRD, prior coronary stenting, and severe systolic heart failure.  LVEF is severely reduced (24% at rest, increases to 32% with stress).  No high risk findings such as TID or drop in EF with stress.  Overall, study suggests RCA territory ischemia and is intermediate risk   LV perfusion is abnormal. There is evidence of ischemia. Defect 1: There is a small defect with moderate reduction in uptake present in the apical to mid inferior location(s) that is reversible. There is abnormal wall motion in the defect area. Consistent with ischemia.   Rest left ventricular function is abnormal. Rest global function is severely reduced. Rest EF: 24%. Stress left ventricular function is abnormal. Stress global function is moderately reduced. Stress EF: 32%. End diastolic cavity size is moderately enlarged. End systolic cavity size is severely enlarged.   Myocardial blood flow reserve is not reported in this patient due to technical or patient-specific concerns that affect accuracy.   Coronary calcium  assessment not performed due to prior revascularization.   Findings are consistent with ischemia. The study is intermediate risk.  Nuclear Amyloid Study 09/10/24:    Findings are not suggestive of cardiac ATTR amyloidosis. The myocardium  was negative for radiotracer uptake.    The visual grade of myocardial uptake relative to the ribs was Grade 0  (No myocardial uptake and normal bone uptake).    CT images were obtained for attenuation correction and were examined  for the presence of coronary calcium  when appropriate.  Coronary calcium  assessment not performed due to prior  revascularization.   Echo 05/12/24:  1. Visual appearance and strain pattern concerning for amyloid. Left  ventricular ejection fraction, by estimation, is 35 to 40%. The left  ventricle has moderately decreased function. The left ventricle  demonstrates global hypokinesis. There  is moderate  concentric left ventricular hypertrophy. Left ventricular diastolic  parameters are consistent with Grade II diastolic dysfunction  (pseudonormalization). The average left ventricular global longitudinal  strain is -9.2 %. The global longitudinal strain is  abnormal.   2. Right ventricular systolic function is low normal. The right  ventricular size is mildly enlarged. There is severely elevated pulmonary  artery systolic pressure.   3. Left atrial size was severely dilated.   4. Right atrial size was severely dilated.   5. A small pericardial effusion is present. The pericardial effusion is  circumferential. There is no evidence of cardiac tamponade.   6. The mitral valve is normal in structure. Mild mitral valve  regurgitation. No evidence of mitral stenosis.   7. Low flow based on stroke volume, so gradient may inaccurately predict  degree of stenosis. Mild stenosis by gradient, moderate by DI (0.28). The  aortic valve is tricuspid. There is moderate calcification of the aortic  valve. Aortic valve regurgitation   is not visualized. Mild to moderate aortic valve stenosis. Aortic valve  mean gradient measures 12.0 mmHg.   8. The inferior vena cava is dilated in size with <50% respiratory  variability, suggesting right atrial pressure of 15 mmHg.   Risk Assessment/Calculations:    CHA2DS2-VASc Score = 5   This indicates a 7.2% annual risk of stroke. The patient's score is based upon: CHF History: 1 HTN History: 1 Diabetes History: 1 Stroke History: 0 Vascular Disease History: 1 Age Score: 1 Gender Score: 0     Physical Exam:   VS:  BP 138/80 (BP Location: Right Arm, Patient Position: Sitting, Cuff Size: Normal)   Pulse 65   Ht 6' 3 (1.905 m) Comment: Per patient former height before amputation  Wt 177 lb (80.3 kg) Comment: Per patient weighed on 10/02/24 at dialysis  SpO2 99%   BMI 22.12 kg/m    Wt Readings from Last 3 Encounters:  11/03/24 177 lb  (80.3 kg)  09/17/24 175 lb (79.4 kg)  09/08/24 173 lb (78.5 kg)     General: Well developed, in no acute distress.  Neck: No JVD.  Cardiac: Normal rate, regular rhythm. SEM. Resp: Normal work of breathing.  Ext: S/p bilateral BKAs. Neuro: No gross focal deficits.  Psych: Normal affect.   ASSESSMENT AND PLAN:    #Paroxysmal atrial fibrillation:  #Hypercoagulable state due to atrial fibrillation:  - We had previously discussed Watchman device.  CT scan shows favorable anatomy.  Patient was started on Eliquis  2.5 mg twice daily after our last visit.  He has been tolerating this without significant issues.  Given his higher than average risk of procedural complications with baseline thrombocytopenia, ESRD, comorbidities, we have elected to remain on Eliquis  2.5 mg twice daily for now.  #Chronic HFrEF: Echo LVEF 35-40%.  Recent amyloid study negative.  Well compensated on exam today. #Moderate aortic stenosis: Can more closely assess with TEE at time of watchman implant, if pursued. -GDMT limited by ESRD. Continue coreg  6.25 mg twice a day.  Continue Entresto  97-103 mg twice daily. -Volume status managed with dialysis.   #. CAD: 2019 cath with stent LAD, LCX, and PTCA RCA.  Denies chest pain. -  Continue atorvastatin .  No longer taking aspirin .  Continue Eliquis  2.5 mg twice daily as above.  Follow up with EP team in 6 months.  Signed, Fonda Kitty, MD  "

## 2024-11-03 ENCOUNTER — Ambulatory Visit: Payer: Medicare (Managed Care) | Attending: Cardiology | Admitting: Cardiology

## 2024-11-03 ENCOUNTER — Encounter: Payer: Self-pay | Admitting: Cardiology

## 2024-11-03 ENCOUNTER — Other Ambulatory Visit (HOSPITAL_COMMUNITY): Payer: Self-pay

## 2024-11-03 VITALS — BP 138/80 | HR 65 | Ht 75.0 in | Wt 177.0 lb

## 2024-11-03 DIAGNOSIS — D6869 Other thrombophilia: Secondary | ICD-10-CM

## 2024-11-03 DIAGNOSIS — I480 Paroxysmal atrial fibrillation: Secondary | ICD-10-CM | POA: Diagnosis not present

## 2024-11-03 DIAGNOSIS — I350 Nonrheumatic aortic (valve) stenosis: Secondary | ICD-10-CM

## 2024-11-03 DIAGNOSIS — I5022 Chronic systolic (congestive) heart failure: Secondary | ICD-10-CM | POA: Diagnosis not present

## 2024-11-03 DIAGNOSIS — I2510 Atherosclerotic heart disease of native coronary artery without angina pectoris: Secondary | ICD-10-CM | POA: Diagnosis not present

## 2024-11-03 DIAGNOSIS — N186 End stage renal disease: Secondary | ICD-10-CM | POA: Diagnosis not present

## 2024-11-03 MED ORDER — APIXABAN 2.5 MG PO TABS
2.5000 mg | ORAL_TABLET | Freq: Two times a day (BID) | ORAL | 3 refills | Status: AC
  Filled 2024-11-03 (×2): qty 180, 90d supply, fill #0

## 2024-11-03 NOTE — Patient Instructions (Signed)
 Medication Instructions:  Your physician recommends that you continue on your current medications as directed. Please refer to the Current Medication list given to you today.  *If you need a refill on your cardiac medications before your next appointment, please call your pharmacy*  Follow-Up: At [facility], you and your health needs are our priority.  As part of our continuing mission to provide you with exceptional heart care, our providers are all part of one team.  This team includes your primary Cardiologist (physician) and Advanced Practice Providers or APPs (Physician Assistants and Nurse Practitioners) who all work together to provide you with the care you need, when you need it.  Your next appointment:   6 months  Provider:   You will see one of the following Advanced Practice Providers on your designated Care Team:   Charlies Arthur, [place] Ozell Jodie Passey, PA-C Suzann Riddle, NP Daphne Barrack, NP Artist Pouch, PA-C

## 2024-11-16 ENCOUNTER — Ambulatory Visit: Payer: Self-pay

## 2024-11-16 ENCOUNTER — Telehealth (HOSPITAL_COMMUNITY): Payer: Self-pay | Admitting: Cardiology

## 2024-11-16 DIAGNOSIS — R911 Solitary pulmonary nodule: Secondary | ICD-10-CM

## 2024-11-16 NOTE — Telephone Encounter (Signed)
 Called to confirm/remind patient of their appointment at the [facility] on 11/16/2024.   Appointment:   [x] Confirmed  [] Left mess   [] No answer/No voice mail  [] VM Full/unable to leave message  [] Phone not in service  Patient reminded to bring all medications and/or complete list.  Confirmed patient has transportation. Gave directions, instructed to utilize valet parking.

## 2024-11-17 ENCOUNTER — Other Ambulatory Visit (HOSPITAL_COMMUNITY): Payer: Self-pay

## 2024-11-17 ENCOUNTER — Encounter (HOSPITAL_COMMUNITY): Payer: Self-pay | Admitting: Cardiology

## 2024-11-17 ENCOUNTER — Ambulatory Visit (HOSPITAL_COMMUNITY): Payer: Self-pay | Admitting: Cardiology

## 2024-11-17 ENCOUNTER — Ambulatory Visit (HOSPITAL_COMMUNITY)
Admission: RE | Admit: 2024-11-17 | Discharge: 2024-11-17 | Disposition: A | Discharge status: Home / Self Care | Payer: Medicare (Managed Care) | Source: Ambulatory Visit | Attending: Cardiology | Admitting: Cardiology

## 2024-11-17 VITALS — BP 120/78 | HR 71

## 2024-11-17 DIAGNOSIS — I440 Atrioventricular block, first degree: Secondary | ICD-10-CM | POA: Diagnosis not present

## 2024-11-17 DIAGNOSIS — Z992 Dependence on renal dialysis: Secondary | ICD-10-CM | POA: Diagnosis not present

## 2024-11-17 DIAGNOSIS — I2510 Atherosclerotic heart disease of native coronary artery without angina pectoris: Secondary | ICD-10-CM | POA: Insufficient documentation

## 2024-11-17 DIAGNOSIS — Z79899 Other long term (current) drug therapy: Secondary | ICD-10-CM | POA: Diagnosis not present

## 2024-11-17 DIAGNOSIS — E1122 Type 2 diabetes mellitus with diabetic chronic kidney disease: Secondary | ICD-10-CM | POA: Diagnosis not present

## 2024-11-17 DIAGNOSIS — Z7901 Long term (current) use of anticoagulants: Secondary | ICD-10-CM | POA: Diagnosis not present

## 2024-11-17 DIAGNOSIS — I5022 Chronic systolic (congestive) heart failure: Secondary | ICD-10-CM | POA: Insufficient documentation

## 2024-11-17 DIAGNOSIS — I132 Hypertensive heart and chronic kidney disease with heart failure and with stage 5 chronic kidney disease, or end stage renal disease: Secondary | ICD-10-CM | POA: Diagnosis not present

## 2024-11-17 DIAGNOSIS — Z89512 Acquired absence of left leg below knee: Secondary | ICD-10-CM | POA: Insufficient documentation

## 2024-11-17 DIAGNOSIS — Z955 Presence of coronary angioplasty implant and graft: Secondary | ICD-10-CM | POA: Insufficient documentation

## 2024-11-17 DIAGNOSIS — Z993 Dependence on wheelchair: Secondary | ICD-10-CM | POA: Insufficient documentation

## 2024-11-17 DIAGNOSIS — Z794 Long term (current) use of insulin: Secondary | ICD-10-CM | POA: Diagnosis not present

## 2024-11-17 DIAGNOSIS — Z89511 Acquired absence of right leg below knee: Secondary | ICD-10-CM | POA: Insufficient documentation

## 2024-11-17 DIAGNOSIS — I350 Nonrheumatic aortic (valve) stenosis: Secondary | ICD-10-CM | POA: Insufficient documentation

## 2024-11-17 DIAGNOSIS — I480 Paroxysmal atrial fibrillation: Secondary | ICD-10-CM | POA: Diagnosis not present

## 2024-11-17 DIAGNOSIS — N186 End stage renal disease: Secondary | ICD-10-CM | POA: Insufficient documentation

## 2024-11-17 LAB — BASIC METABOLIC PANEL WITH GFR
Anion gap: 11 (ref 5–15)
BUN: 32 mg/dL — ABNORMAL HIGH (ref 8–23)
CO2: 32 mmol/L (ref 22–32)
Calcium: 8.8 mg/dL — ABNORMAL LOW (ref 8.9–10.3)
Chloride: 93 mmol/L — ABNORMAL LOW (ref 98–111)
Creatinine, Ser: 4.32 mg/dL — ABNORMAL HIGH (ref 0.61–1.24)
GFR, Estimated: 14 mL/min — ABNORMAL LOW
Glucose, Bld: 121 mg/dL — ABNORMAL HIGH (ref 70–99)
Potassium: 4.9 mmol/L (ref 3.5–5.1)
Sodium: 135 mmol/L (ref 135–145)

## 2024-11-17 NOTE — Progress Notes (Signed)
 "   Primary Care: [facility] Cardiology: Dr. Rolan  Chief Complaint: Heart Failure   HPI: [name]  is a 74 y.o. with a history of HTN, DM II, CAD, aortic stenosis, systolic CHF, bilateral BKA,and ESRD since 2023. He relocated back to [facility] in 2020, had been living in [place] for >30 years prior to that.  He worked in a pharmacist, hospital.   Last cath was in 2019, showing 70% distal LAD, patent D1 stent but with 90% ostial D1, patent OM1 stent, occluded PDA.  Attempt was made to intervene on PDA but was unsuccessful (PTCA with residual 99% stenosis).  Cardiac PET in 9/25 showed inferior ischemia likely related to known severe PDA disease.   Last echo in 7/25 showed EF 35-40%, moderate LVH, mildly dilated RV with low normal systolic function, severe biatrial enlargement, mild MR, mild-moderate AS with mean gradient 12 but DI 0.28, IVC dilated. Workup for amyloidosis was done, PYP scan in 11/25 was negative (grade 0).  Patient was found to have an IgG monoclonal protein.  He saw Dr. Federico, bone marrow biopsy in 10/25 was normocellular with 9% plasma cells, no evidence for plasma cell neoplasm. Congo red stain was negative, no evidence for amyloidosis. Bone metastatic survey was negative in 10/25.   Patient has had problems with excessive bleeding from his HD access site. He has been anticoagulated for paroxysmal atrial fibrillation, now on apixaban  2.5 mg bid.  He was seen by EP and Watchman was considered but ultimately decided again.   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   Cardiac MRI in 12/25 showed LV EF 38% with diffuse hypokinesis, moderate RV dilation with RV EF 32%, moderate AS with planimetered AVA 1.44 cm^2, LV difficult to null, diffuse mid-wall LGE all basal segments and the mid septum; ECV 46%.   Patient returns for followup of CHF.  Patient is generally wheelchair-bound with his  bilateral BKAs.  He does have prosthetics and is able to stand and do a little walking with a walker with help.  His goal is to start walking more.  [facility]  No dyspnea wheeling his chair or with transfers, he feels like his breathing is better since dry weight was lowered at HD. Still urinates and takes Lasix  on non-HD days. BP controlled, does not have much problem with hypotension during HD.  No chest pain.   ECG (personally reviewed): NSR, 1st degree AVB, nonspecific T wave flattening  Labs (9/25): pro-BNP > 70K, LDL 45 Labs (10/25): myeloma panel with IgG monoclonal protein, Kappa/lambda radio 11.46 Labs (11/25): hgb 9, plts 70  PMH: 1. ESRD since 2023 2. PAD: s/p bilateral BKA.  3. Thrombocytopenia 4. Atrial fibrillation: Paroxysmal.  5. HTN 6. Type 2 diabetes 7. 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  8. MGUS: IgG monoclonal protein.  - Bone marrow biopsy 10/25 was normocellular with 9% plasma cells, no evidence for plasma cell neoplasm. The sample does not appear to have been stained for amyloidosis. - Bone metastatic survey negative in 10/25.  9. CAD: Most recent cath in 2019 showed 70% distal LAD, patent D1 stent but with 90% ostial D1, patent OM1 stent, occluded PDA.  Attempt was made to intervene on PDA but was unsuccessful (PTCA with residual 99% stenosis).  - Cardiac PET (9/25):  inferior ischemia likely related to known severe PDA disease.  10. Chronic systolic  CHF: ?Ischemic cardiomyopathy. 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  - Echo (7/25): EF 35-40%, moderate LVH, mildly dilated RV with low normal systolic function, severe biatrial enlargement, mild MR, mild-moderate AS with mean gradient 12 but DI 0.28, IVC dilated.  - PYP scan 11/25: Grade 0.  - Cardiac MRI (12/25): LV EF 38% with diffuse hypokinesis, moderate RV dilation with RV EF 32%, moderate AS with planimetered AVA 1.44 cm^2, LV difficult  to null, diffuse mid-wall LGE all basal segments and the mid septum; ECV 46%.  11. Aortic stenosis: Mild to moderate on 7/25 echo.  12. Psoriatic arthritis  Current Outpatient Medications  Medication Sig Dispense Refill   acetaminophen  (TYLENOL ) 325 MG tablet Take 1-2 tablets (325-650 mg total) by mouth every 6 (six) hours as needed for mild pain (pain score 1-3 or temp > 100.5).     amLODipine  (NORVASC ) 5 MG tablet Take 5 mg by mouth at bedtime.     apixaban  (ELIQUIS ) 2.5 MG TABS tablet Take 1 tablet (2.5 mg total) by mouth 2 (two) times daily. 180 tablet 3   atorvastatin  (LIPITOR ) 20 MG tablet Take 1 tablet (20 mg total) by mouth daily. 90 tablet 3   carvedilol  (COREG ) 25 MG tablet Take 1 tablet (25 mg total) by mouth 2 (two) times daily with a meal.     cyanocobalamin  (VITAMIN B12) 1000 MCG tablet Take 1 tablet (1,000 mcg total) by mouth daily. 90 tablet 1   finasteride  (PROSCAR ) 5 MG tablet Take 5 mg by mouth daily.     furosemide  (LASIX ) 40 MG tablet Take 1 tablet (40 mg total) by mouth 2 (two) times daily.     gabapentin  (NEURONTIN ) 300 MG capsule Take 300 mg by mouth at bedtime.     gabapentin  (NEURONTIN ) 600 MG tablet Take 600 mg by mouth 2 (two) times daily.     insulin  aspart (NOVOLOG ) 100 UNIT/ML injection Inject 3 Units into the skin daily as needed for high blood sugar.     loratadine  (CLARITIN ) 10 MG tablet Take 10 mg by mouth daily.     melatonin 5 MG TABS Take 5 mg by mouth at bedtime.     Methoxy PEG-Epoetin  Beta (MIRCERA IJ) 225 mcg.     omeprazole  (PRILOSEC ) 20 MG capsule Take 20 mg by mouth daily.     oxyCODONE  (OXY IR/ROXICODONE ) 5 MG immediate release tablet Take 1 tablet (5 mg total) by mouth every 4 (four) hours as needed for severe pain (pain score 7-10). 20 tablet 0   pantoprazole  (PROTONIX ) 40 MG tablet Take 40 mg by mouth daily.     sacubitril -valsartan  (ENTRESTO ) 97-103 MG Take 1 tablet by mouth 2 (two) times daily. 180 tablet 3   sevelamer  carbonate (RENVELA ) 800  MG tablet Take 2 tablets (1,600 mg total) by mouth 3 (three) times daily with meals.     tamsulosin  (FLOMAX ) 0.4 MG CAPS capsule Take 0.4 mg by mouth at bedtime.     Doxercalciferol  (HECTOROL  IV) 2 mcg. (Patient not taking: Reported on 11/17/2024)     No current facility-administered medications for this encounter.    No Known Allergies    Social History   Socioeconomic History   Marital status: Legally Separated    Spouse name: Not on file   Number of children: 4   Years of education: Not on file   Highest education level: Not on file  Occupational History   Not on file  Tobacco Use   Smoking status: Former  Current packs/day: 0.00    Average packs/day: 0.3 packs/day for 20.0 years (6.6 ttl pk-yrs)    Types: Cigarettes    Start date: 57    Quit date: 52    Years since quitting: 41.0   Smokeless tobacco: Never  Vaping Use   Vaping status: Never Used  Substance and Sexual Activity   Alcohol use: Not Currently   Drug use: Not Currently   Sexual activity: Not Currently  Other Topics Concern   Not on file  Social History Narrative   Are you right handed or left handed? Right Handed   Are you currently employed ? Retired since 2016   What is your current occupation?   Do you live at home alone? yes   Who lives with you?    What type of home do you live in: 1 story or 2 story? Lives in a one story floor plan.       Social Drivers of Health   Tobacco Use: Medium Risk (11/17/2024)   Patient History    Smoking Tobacco Use: Former    Smokeless Tobacco Use: Never    Passive Exposure: Not on file  Financial Resource Strain: Not on file  Food Insecurity: No Food Insecurity (01/07/2024)   Hunger Vital Sign    Worried About Running Out of Food in the Last Year: Never true    Ran Out of Food in the Last Year: Never true  Transportation Needs: No Transportation Needs (01/07/2024)   PRAPARE - Administrator, Civil Service (Medical): No    Lack of Transportation  (Non-Medical): No  Physical Activity: Not on file  Stress: Not on file  Social Connections: Socially Isolated (01/07/2024)   Social Connection and Isolation Panel    Frequency of Communication with Friends and Family: More than three times a week    Frequency of Social Gatherings with Friends and Family: More than three times a week    Attends Religious Services: Never    Database Administrator or Organizations: No    Attends Banker Meetings: Never    Marital Status: Divorced  Catering Manager Violence: Not At Risk (01/07/2024)   Humiliation, Afraid, Rape, and Kick questionnaire    Fear of Current or Ex-Partner: No    Emotionally Abused: No    Physically Abused: No    Sexually Abused: No  Depression (PHQ2-9): Not on file  Alcohol Screen: Not on file  Housing: Low Risk (01/07/2024)   Housing Stability Vital Sign    Unable to Pay for Housing in the Last Year: No    Number of Times Moved in the Last Year: 0    Homeless in the Last Year: No  Utilities: Not At Risk (01/07/2024)   AHC Utilities    Threatened with loss of utilities: No  Health Literacy: Not on file   FH: He has 8 siblings. 2 brothers with heart issues but not sure what kind. He has 4 children with no known cardiac issues. His children have had limited medical care because they don't  trust the  medical system.    Vitals:   11/17/24 1011  BP: 120/78  Pulse: 71  SpO2: 93%   Wt Readings from Last 3 Encounters:  11/03/24 80.3 kg (177 lb)  09/17/24 79.4 kg (175 lb)  09/08/24 78.5 kg (173 lb)    PHYSICAL EXAM: General: NAD Neck: JVp 8-9 cm, no thyromegaly or thyroid  nodule.  Lungs: Clear to auscultation bilaterally with normal respiratory effort.  CV: Nondisplaced PMI.  Heart regular S1/S2, no S3/S4, 2/6 early SEM RUSB.   No carotid bruit.    Abdomen: Soft, nontender, no hepatosplenomegaly, no distention.  Skin: Intact without lesions or rashes.  Neurologic: Alert and oriented x 3.  Psych: Normal  affect. Extremities: No clubbing or cyanosis. Bilateral BKAs.  HEENT: Normal.   ASSESSMENT & PLAN:  1. Chronic systolic CHF: Patient has a history of CAD, so at least in part, he may have an ischemic cardiomyopathy.  Echo in 7/25 showed EF 35-40%, moderate LVH, mildly dilated RV with low normal systolic function, severe biatrial enlargement, mild MR, mild-moderate AS with mean gradient 12 but DI 0.28, IVC dilated.  Amyloidosis workup was done, PYP scan was negative.  Myeloma panel showed a monoclonal kappa light chain, bone marrow biopsy showed normocellular marrow with negative staining for amyloidosis. Cardiac MRI showed LV EF 38% with diffuse hypokinesis, moderate RV dilation with RV EF 32%, moderate AS with planimetered AVA 1.44 cm^2, LV difficult to null, diffuse mid-wall LGE all basal segments and the mid septum; ECV 46%. The cardiac MRI was suggestive of cardiac amyloidosis.  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  NYHA class II, wheelchair-bound due to BKAs. He is mildly volume overloaded today but better since dry weight was lowered.  - Continue current Lasix  on non-HD days.  - Continue Entresto  97/103 bid.  - Continue Coreg  25 mg bid.  - Could consider initiation of Bidil  in the future if BP stays stable with HD.  - Given concern for AL amyloidosis based on cardiac MRI and monoclonal gammopathy and negative amyloid staining of the bone marrow, I will arrange for endomyocardial biopsy with amyloid staining.  I discussed risks/benefits with patient and he agrees to procedure.  He will hold Eliquis  prior and restart afterwards.  - With TTN mutation of uncertain etiology, I will refer him to Dr. Fairy for evaluation.  2. Aortic Stenosis: Suspect mild-moderate, no more than moderate, on 7/25 echo.  - Follow over time.   3. ESRD: Dry weight recnetly lowered.  4. HTN: Controlled. Continue Coreg , Entresto , amlodipine .  5. CAD: Last cath in 2019 with  70% distal LAD, patent D1 stent but with 90% ostial D1, patent OM1 stent, occluded PDA.  Attempt was made to intervene on PDA but was unsuccessful (PTCA with residual 99% stenosis).  Cardiac PET in 9/25 showed inferior ischemia likely related to known severe PDA disease.  - On apixaban , no ASA.  - Continue atorvastatin , good lipids in 9/25.  6. Atrial fibrillation: Paroxysmal.  He is in NSR today.  Currently on Eliquis  but has had excessive dialysis site bleeding. He spoke with Dr. Kennyth and they decided against Watchman.  - Continue current Eliquis .   Followup in 2 months with APP   I spent 42 minutes reviewing records, interviewing/examining patient, and managing orders.   Ezra Shuck  11/17/2024    "

## 2024-11-17 NOTE — Patient Instructions (Signed)
 There has been no changes to your medications.  Labs done today, your results will be available in MyChart, we will contact you for abnormal readings.  You are scheduled for a Cardiac Catheterization on Thursday, January 29 with Dr. Odis Brownie.  1. Please arrive at the [facility] (Main Entrance A) at [facility]: [address] [place], [place] [address] at 5:30 AM (This time is 2 hour(s) before your procedure to ensure your preparation).   Free valet parking service is available. You will check in at ADMITTING. The support person will be asked to wait in the waiting room.  It is OK to have someone drop you off and come back when you are ready to be discharged.    Special note: Every effort is made to have your procedure done on time. Please understand that emergencies sometimes delay scheduled procedures.  2. Diet: Nothing to eat after midnight.   3. Medication instructions in preparation for your procedure:   Contrast Allergy: No  HOLD YOUR LASIX  THE MORNING OF YOUR PROCEDURE.  Stop taking Eliquis  (Apixiban) on Sunday, January 25.  On the morning of your procedure, take any morning medicines NOT listed above.  You may use sips of water .  6. Plan to go home the same day, you will only stay overnight if medically necessary. 7. Bring a current list of your medications and current insurance cards. 8. You MUST have a responsible person to drive you home. 9. Someone MUST be with you the first 24 hours after you arrive home or your discharge will be delayed. 10. Please wear clothes that are easy to get on and off and wear slip-on shoes.  You have been referred to Dr. Danford Pac. Her office will call you to arrange your appointment.  Your physician recommends that you schedule a follow-up appointment in: 2 months  If you have any questions or concerns before your next appointment please send us  a message through [place] or call our office at [phone].    TO LEAVE A  MESSAGE FOR THE NURSE SELECT OPTION 2, PLEASE LEAVE A MESSAGE INCLUDING: YOUR NAME DATE OF BIRTH CALL BACK NUMBER REASON FOR CALL**this is important as we prioritize the call backs  YOU WILL RECEIVE A CALL BACK THE SAME DAY AS LONG AS YOU CALL BEFORE 4:00 PM  At the [facility], you and your health needs are our priority. As part of our continuing mission to provide you with exceptional heart care, we have created designated Provider Care Teams. These Care Teams include your primary Cardiologist (physician) and Advanced Practice Providers (APPs- Physician Assistants and Nurse Practitioners) who all work together to provide you with the care you need, when you need it.   You may see any of the following providers on your designated Care Team at your next follow up: Dr Toribio Fuel Dr Ezra Shuck Dr. Morene Brownie Greig Mosses, NP Caffie Shed, [place] [facility] Muncy Sharpes, [place] Beckey Coe, NP Jordan Lee, NP Ellouise Class, NP Tinnie Redman, PharmD Jaun Bash, PharmD   Please be sure to bring in all your medications bottles to every appointment.    Thank you for choosing [facility]

## 2024-11-24 ENCOUNTER — Encounter (HOSPITAL_COMMUNITY): Payer: Self-pay

## 2024-11-25 ENCOUNTER — Telehealth (HOSPITAL_COMMUNITY): Payer: Self-pay

## 2024-11-25 ENCOUNTER — Other Ambulatory Visit (HOSPITAL_COMMUNITY): Payer: Self-pay | Admitting: Internal Medicine

## 2024-11-25 NOTE — Telephone Encounter (Signed)
 Attempted to reach patient regarding procedure scheduled for tomorrow. Unable to leave voice message.

## 2024-11-25 NOTE — Telephone Encounter (Signed)
 Called nephew to inform him that biopsy was cancelled tomorrow due waiting on prior Auth from [place]. Told nephew once we get approval from [facility] we will re schedule the Biopsy.

## 2024-11-26 ENCOUNTER — Ambulatory Visit (HOSPITAL_COMMUNITY): Admission: RE | Admit: 2024-11-26 | Payer: Medicare (Managed Care) | Source: Home / Self Care | Admitting: Cardiology

## 2024-11-26 ENCOUNTER — Encounter (HOSPITAL_COMMUNITY): Admission: RE | Payer: Self-pay | Source: Home / Self Care

## 2024-11-26 ENCOUNTER — Telehealth (HOSPITAL_COMMUNITY): Payer: Self-pay

## 2024-11-26 NOTE — Telephone Encounter (Signed)
 William Webb with [facility] called and stated that the patient does not want to have the RHC and Biopsy done anymore. He would rather have a watchmen device. The MD at [facility] agrees, please advise.  CB#[phone]

## 2024-11-26 NOTE — Telephone Encounter (Signed)
 Provided [facility] with Dr. Keneth number.

## 2024-11-26 NOTE — Telephone Encounter (Signed)
 I am not sure that the [facility] MD knows what he or she is talking about.  Watchman is not an issue at this point, Dr. Kennyth is not going to be placing a Watchman per last note.  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  I certainly hope that the [facility] MD was not discouraging him from this.  If the [facility] MD needs some clarification about the issues at stake here, because it sounds like he or she is confused, they're welcome to call me.

## 2024-11-27 NOTE — Telephone Encounter (Signed)
 Tracey with [facility] called again for update   [facility] [phone]

## 2024-12-01 ENCOUNTER — Telehealth (HOSPITAL_COMMUNITY): Payer: Self-pay

## 2024-12-01 NOTE — Telephone Encounter (Signed)
 Spoke with patient about his concerns, in relation to his Biopsy. All questions answered and biopsy scheduled for Thursday 12/10/24 at 10.30 am arrival time is 8.30 am. Patient instructed to take last dose of Eliquis  Sunday 12/06/24 till after his procedure. Also instructed to hold his lasix  the morning of his procedure. PA sent to [facility]

## 2024-12-10 ENCOUNTER — Ambulatory Visit (HOSPITAL_COMMUNITY): Admission: RE | Admit: 2024-12-10 | Payer: Medicare (Managed Care) | Admitting: Cardiology

## 2024-12-10 ENCOUNTER — Encounter (HOSPITAL_COMMUNITY): Admission: RE | Payer: Self-pay

## 2024-12-17 ENCOUNTER — Inpatient Hospital Stay: Payer: Medicare (Managed Care) | Attending: Physician Assistant

## 2025-01-05 ENCOUNTER — Ambulatory Visit: Payer: Medicare (Managed Care) | Admitting: Genetic Counselor

## 2025-01-14 ENCOUNTER — Ambulatory Visit (HOSPITAL_COMMUNITY): Payer: Medicare (Managed Care)

## 2025-03-18 ENCOUNTER — Ambulatory Visit: Payer: Medicare (Managed Care) | Admitting: Cardiology

## 2025-03-25 ENCOUNTER — Inpatient Hospital Stay: Payer: Medicare (Managed Care)

## 2025-03-25 ENCOUNTER — Inpatient Hospital Stay: Payer: Medicare (Managed Care) | Attending: Physician Assistant | Admitting: Hematology and Oncology
